# Patient Record
Sex: Female | Born: 1950 | Race: Black or African American | Hispanic: No | Smoking: Never smoker
Health system: Southern US, Community
[De-identification: ages and names within clinical notes are randomized; demographics above are authoritative.]

## PROBLEM LIST (undated history)

## (undated) DIAGNOSIS — N2581 Secondary hyperparathyroidism of renal origin: Secondary | ICD-10-CM

## (undated) DIAGNOSIS — S72002A Fracture of unspecified part of neck of left femur, initial encounter for closed fracture: Secondary | ICD-10-CM

## (undated) DIAGNOSIS — I619 Nontraumatic intracerebral hemorrhage, unspecified: Secondary | ICD-10-CM

## (undated) DIAGNOSIS — E46 Unspecified protein-calorie malnutrition: Secondary | ICD-10-CM

## (undated) DIAGNOSIS — I639 Cerebral infarction, unspecified: Secondary | ICD-10-CM

## (undated) DIAGNOSIS — I160 Hypertensive urgency: Secondary | ICD-10-CM

## (undated) DIAGNOSIS — K5792 Diverticulitis of intestine, part unspecified, without perforation or abscess without bleeding: Secondary | ICD-10-CM

## (undated) DIAGNOSIS — K2970 Gastritis, unspecified, without bleeding: Secondary | ICD-10-CM

## (undated) DIAGNOSIS — C55 Malignant neoplasm of uterus, part unspecified: Secondary | ICD-10-CM

## (undated) DIAGNOSIS — F419 Anxiety disorder, unspecified: Secondary | ICD-10-CM

## (undated) DIAGNOSIS — K222 Esophageal obstruction: Secondary | ICD-10-CM

## (undated) DIAGNOSIS — Q059 Spina bifida, unspecified: Secondary | ICD-10-CM

## (undated) DIAGNOSIS — I10 Essential (primary) hypertension: Secondary | ICD-10-CM

## (undated) DIAGNOSIS — D649 Anemia, unspecified: Secondary | ICD-10-CM

## (undated) DIAGNOSIS — K219 Gastro-esophageal reflux disease without esophagitis: Secondary | ICD-10-CM

## (undated) DIAGNOSIS — E039 Hypothyroidism, unspecified: Secondary | ICD-10-CM

## (undated) DIAGNOSIS — R42 Dizziness and giddiness: Secondary | ICD-10-CM

## (undated) DIAGNOSIS — N189 Chronic kidney disease, unspecified: Secondary | ICD-10-CM

## (undated) DIAGNOSIS — H532 Diplopia: Secondary | ICD-10-CM

## (undated) DIAGNOSIS — R1310 Dysphagia, unspecified: Secondary | ICD-10-CM

## (undated) DIAGNOSIS — N186 End stage renal disease: Secondary | ICD-10-CM

## (undated) HISTORY — DX: Hypothyroidism, unspecified: E03.9

## (undated) HISTORY — PX: ABDOMINAL HYSTERECTOMY: SHX81

## (undated) HISTORY — PX: CHOLECYSTECTOMY: SHX55

## (undated) HISTORY — PX: LAPAROSCOPIC TOTAL HYSTERECTOMY: SUR800

## (undated) HISTORY — DX: Essential (primary) hypertension: I10

## (undated) HISTORY — DX: Malignant neoplasm of uterus, part unspecified: C55

## (undated) HISTORY — PX: NASAL SEPTUM SURGERY: SHX37

## (undated) HISTORY — PX: APPENDECTOMY: SHX54

## (undated) HISTORY — PX: COLONOSCOPY: SHX174

---

## 1998-08-02 HISTORY — PX: COLONOSCOPY: SHX174

## 2001-02-28 ENCOUNTER — Other Ambulatory Visit: Admission: RE | Admit: 2001-02-28 | Discharge: 2001-02-28 | Payer: Self-pay | Admitting: Obstetrics and Gynecology

## 2001-05-03 ENCOUNTER — Encounter: Payer: Self-pay | Admitting: Family Medicine

## 2001-05-03 ENCOUNTER — Ambulatory Visit (HOSPITAL_COMMUNITY): Admission: RE | Admit: 2001-05-03 | Discharge: 2001-05-03 | Payer: Self-pay | Admitting: Family Medicine

## 2002-03-02 ENCOUNTER — Ambulatory Visit (HOSPITAL_COMMUNITY): Admission: RE | Admit: 2002-03-02 | Discharge: 2002-03-02 | Payer: Self-pay | Admitting: Internal Medicine

## 2002-08-20 ENCOUNTER — Ambulatory Visit (HOSPITAL_COMMUNITY): Admission: RE | Admit: 2002-08-20 | Discharge: 2002-08-20 | Payer: Self-pay | Admitting: Family Medicine

## 2002-08-20 ENCOUNTER — Encounter: Payer: Self-pay | Admitting: Family Medicine

## 2003-03-08 ENCOUNTER — Encounter: Payer: Self-pay | Admitting: Internal Medicine

## 2003-03-08 ENCOUNTER — Ambulatory Visit (HOSPITAL_COMMUNITY): Admission: RE | Admit: 2003-03-08 | Discharge: 2003-03-08 | Payer: Self-pay | Admitting: Internal Medicine

## 2003-03-14 ENCOUNTER — Ambulatory Visit (HOSPITAL_COMMUNITY): Admission: RE | Admit: 2003-03-14 | Discharge: 2003-03-14 | Payer: Self-pay | Admitting: Orthopedic Surgery

## 2003-03-14 ENCOUNTER — Encounter: Payer: Self-pay | Admitting: Orthopedic Surgery

## 2003-03-26 ENCOUNTER — Encounter: Payer: Self-pay | Admitting: Family Medicine

## 2003-03-26 ENCOUNTER — Ambulatory Visit (HOSPITAL_COMMUNITY): Admission: RE | Admit: 2003-03-26 | Discharge: 2003-03-26 | Payer: Self-pay | Admitting: Family Medicine

## 2003-04-01 ENCOUNTER — Ambulatory Visit (HOSPITAL_COMMUNITY): Admission: RE | Admit: 2003-04-01 | Discharge: 2003-04-01 | Payer: Self-pay | Admitting: Nephrology

## 2003-04-01 ENCOUNTER — Encounter: Payer: Self-pay | Admitting: Nephrology

## 2003-08-14 ENCOUNTER — Other Ambulatory Visit: Admission: RE | Admit: 2003-08-14 | Discharge: 2003-08-14 | Payer: Self-pay | Admitting: Obstetrics and Gynecology

## 2003-08-29 ENCOUNTER — Ambulatory Visit (HOSPITAL_COMMUNITY): Admission: RE | Admit: 2003-08-29 | Discharge: 2003-08-29 | Payer: Self-pay | Admitting: Obstetrics and Gynecology

## 2004-03-09 ENCOUNTER — Ambulatory Visit (HOSPITAL_COMMUNITY): Admission: RE | Admit: 2004-03-09 | Discharge: 2004-03-09 | Payer: Self-pay | Admitting: Family Medicine

## 2004-11-25 ENCOUNTER — Ambulatory Visit (HOSPITAL_COMMUNITY): Admission: RE | Admit: 2004-11-25 | Discharge: 2004-11-26 | Payer: Self-pay | Admitting: *Deleted

## 2004-11-27 ENCOUNTER — Ambulatory Visit (HOSPITAL_COMMUNITY): Admission: RE | Admit: 2004-11-27 | Discharge: 2004-11-27 | Payer: Self-pay | Admitting: *Deleted

## 2006-07-19 ENCOUNTER — Encounter (HOSPITAL_COMMUNITY): Admission: RE | Admit: 2006-07-19 | Discharge: 2006-08-01 | Payer: Self-pay | Admitting: Endocrinology

## 2007-04-25 ENCOUNTER — Ambulatory Visit (HOSPITAL_COMMUNITY): Admission: RE | Admit: 2007-04-25 | Discharge: 2007-04-25 | Payer: Self-pay | Admitting: Family Medicine

## 2008-12-02 ENCOUNTER — Emergency Department (HOSPITAL_COMMUNITY): Admission: EM | Admit: 2008-12-02 | Discharge: 2008-12-02 | Payer: Self-pay | Admitting: Emergency Medicine

## 2010-03-19 ENCOUNTER — Other Ambulatory Visit: Admission: RE | Admit: 2010-03-19 | Discharge: 2010-03-19 | Payer: Self-pay | Admitting: Obstetrics & Gynecology

## 2010-03-31 ENCOUNTER — Encounter (HOSPITAL_COMMUNITY): Admission: RE | Admit: 2010-03-31 | Discharge: 2010-03-31 | Payer: Self-pay | Admitting: Obstetrics & Gynecology

## 2010-03-31 ENCOUNTER — Ambulatory Visit (HOSPITAL_COMMUNITY): Payer: Self-pay | Admitting: Obstetrics & Gynecology

## 2010-04-01 ENCOUNTER — Ambulatory Visit (HOSPITAL_COMMUNITY): Admission: RE | Admit: 2010-04-01 | Discharge: 2010-04-01 | Payer: Self-pay | Admitting: Obstetrics & Gynecology

## 2010-05-06 ENCOUNTER — Ambulatory Visit: Admission: RE | Admit: 2010-05-06 | Discharge: 2010-05-06 | Payer: Self-pay | Admitting: Gynecologic Oncology

## 2010-05-12 ENCOUNTER — Encounter: Payer: Self-pay | Admitting: Obstetrics & Gynecology

## 2010-05-13 ENCOUNTER — Inpatient Hospital Stay (HOSPITAL_COMMUNITY): Admission: RE | Admit: 2010-05-13 | Discharge: 2010-05-14 | Payer: Self-pay | Admitting: Obstetrics & Gynecology

## 2010-06-17 ENCOUNTER — Ambulatory Visit
Admission: RE | Admit: 2010-06-17 | Discharge: 2010-06-17 | Payer: Self-pay | Source: Home / Self Care | Admitting: Gynecologic Oncology

## 2010-08-02 DIAGNOSIS — C55 Malignant neoplasm of uterus, part unspecified: Secondary | ICD-10-CM

## 2010-08-02 HISTORY — DX: Malignant neoplasm of uterus, part unspecified: C55

## 2010-10-15 LAB — COMPREHENSIVE METABOLIC PANEL
ALT: 13 U/L (ref 0–35)
ALT: 15 U/L (ref 0–35)
AST: 18 U/L (ref 0–37)
AST: 15 U/L (ref 0–37)
Albumin: 3.6 g/dL (ref 3.5–5.2)
Albumin: 3.3 g/dL — ABNORMAL LOW (ref 3.5–5.2)
Alkaline Phosphatase: 54 U/L (ref 39–117)
Calcium: 9.8 mg/dL (ref 8.4–10.5)
Calcium: 9.3 mg/dL (ref 8.4–10.5)
GFR calc Af Amer: 33 mL/min — ABNORMAL LOW (ref >60)
Glucose, Bld: 81 mg/dL (ref 70–99)
Potassium: 3.2 mEq/L — ABNORMAL LOW (ref 3.5–5.1)
Total Protein: 7.5 g/dL (ref 6.0–8.3)
Total Protein: 6.5 g/dL (ref 6.0–8.3)

## 2010-10-15 LAB — BASIC METABOLIC PANEL
Calcium: 8.8 mg/dL (ref 8.4–10.5)
Chloride: 110 mEq/L (ref 96–112)
Creatinine, Ser: 1.98 mg/dL — ABNORMAL HIGH (ref 0.4–1.2)
GFR calc Af Amer: 31 mL/min — ABNORMAL LOW (ref >60)
Glucose, Bld: 160 mg/dL — ABNORMAL HIGH (ref 70–99)

## 2010-10-15 LAB — CROSSMATCH

## 2010-10-15 LAB — DIFFERENTIAL
Basophils Absolute: 0 10*3/uL (ref 0.0–0.1)
Basophils Absolute: 0 10*3/uL (ref 0.0–0.1)
Basophils Relative: 0 % (ref 0–1)
Eosinophils Absolute: 0.2 10*3/uL (ref 0.0–0.7)
Eosinophils Absolute: 0.2 10*3/uL (ref 0.0–0.7)
Eosinophils Relative: 4 % (ref 0–5)
Eosinophils Relative: 4 % (ref 0–5)
Monocytes Absolute: 0.6 10*3/uL (ref 0.1–1.0)
Monocytes Relative: 10 % (ref 3–12)
Neutro Abs: 3.8 10*3/uL (ref 1.7–7.7)
Neutrophils Relative %: 58 % (ref 43–77)

## 2010-10-15 LAB — CBC
Hemoglobin: 9.6 g/dL — ABNORMAL LOW (ref 12.0–15.0)
Hemoglobin: 7.9 g/dL — ABNORMAL LOW (ref 12.0–15.0)
MCH: 24.9 pg — ABNORMAL LOW (ref 26.0–34.0)
MCHC: 32.4 g/dL (ref 30.0–36.0)
MCHC: 32.4 g/dL (ref 30.0–36.0)
MCV: 76.7 fL — ABNORMAL LOW (ref 78.0–100.0)
MCV: 77.1 fL — ABNORMAL LOW (ref 78.0–100.0)
MCV: 76.1 fL — ABNORMAL LOW (ref 78.0–100.0)
Platelets: 194 10*3/uL (ref 150–400)
Platelets: 215 10*3/uL (ref 150–400)
Platelets: 247 10*3/uL (ref 150–400)
RBC: 3.31 MIL/uL — ABNORMAL LOW (ref 3.87–5.11)
RDW: 16.8 % — ABNORMAL HIGH (ref 11.5–15.5)
WBC: 6.1 10*3/uL (ref 4.0–10.5)

## 2010-10-15 LAB — SURGICAL PCR SCREEN
Staphylococcus aureus: NEGATIVE
Staphylococcus aureus: POSITIVE — AB

## 2010-10-15 LAB — URINALYSIS, ROUTINE W REFLEX MICROSCOPIC
Bilirubin Urine: NEGATIVE
Ketones, ur: NEGATIVE mg/dL
Leukocytes, UA: NEGATIVE
Nitrite: NEGATIVE
Urobilinogen, UA: 0.2 mg/dL (ref 0.0–1.0)
pH: 6 (ref 5.0–8.0)

## 2010-10-15 LAB — ABO/RH: ABO/RH(D): B POS

## 2010-10-15 LAB — URINE MICROSCOPIC-ADD ON

## 2010-10-15 LAB — TYPE AND SCREEN: ABO/RH(D): B POS

## 2010-10-15 LAB — POCT I-STAT 4, (NA,K, GLUC, HGB,HCT): Hemoglobin: 11.2 g/dL — ABNORMAL LOW (ref 12.0–15.0)

## 2010-11-10 LAB — URINALYSIS, ROUTINE W REFLEX MICROSCOPIC
Glucose, UA: NEGATIVE mg/dL
Leukocytes, UA: NEGATIVE
Protein, ur: 100 mg/dL — AB
Urobilinogen, UA: 0.2 mg/dL (ref 0.0–1.0)
pH: 7 (ref 5.0–8.0)

## 2010-11-10 LAB — DIFFERENTIAL
Basophils Absolute: 0 10*3/uL (ref 0.0–0.1)
Basophils Relative: 0 % (ref 0–1)
Eosinophils Absolute: 0 10*3/uL (ref 0.0–0.7)
Monocytes Relative: 4 % (ref 3–12)
Neutrophils Relative %: 82 % — ABNORMAL HIGH (ref 43–77)

## 2010-11-10 LAB — BASIC METABOLIC PANEL
BUN: 26 mg/dL — ABNORMAL HIGH (ref 6–23)
CO2: 26 mEq/L (ref 19–32)
Chloride: 102 mEq/L (ref 96–112)
Creatinine, Ser: 1.46 mg/dL — ABNORMAL HIGH (ref 0.4–1.2)
Glucose, Bld: 117 mg/dL — ABNORMAL HIGH (ref 70–99)

## 2010-11-10 LAB — URINE MICROSCOPIC-ADD ON

## 2010-11-10 LAB — CBC
MCHC: 32.2 g/dL (ref 30.0–36.0)
MCV: 74.9 fL — ABNORMAL LOW (ref 78.0–100.0)
Platelets: 205 10*3/uL (ref 150–400)
RDW: 17.1 % — ABNORMAL HIGH (ref 11.5–15.5)

## 2010-12-18 NOTE — Procedures (Signed)
NAMESABIRIN, Sonya [name]                ACCOUNT NO.:  [id number]   MEDICAL RECORD NO.:  [id number]          PATIENT TYPE:  REC   LOCATION:  RAD                           FACILITY:  [facility]   PHYSICIAN:  Leslye Peer, MD       DATE OF BIRTH:  [birth year]   DATE OF PROCEDURE:  DATE OF DISCHARGE:  11/26/2004                                    STRESS TEST   INDICATIONS:  [name] has been experiencing palpations and increased  heart rate and is referred for a stress Cardiolite to assess for the  possibility of cardiac ischemia as the etiology of her complaints.   HEMODYNAMICS/ELECTROCARDIOGRAPHIC DATA:  Baseline blood pressure is 128/72  mmHg with a pulse of 81 beats per minute.   A 12-lead EKG revealed normal sinus rhythm without ischemic changes noted.  She walked for 4 minutes and 28 seconds on a full Bruce protocol, surpassing  her target heart rate of 140 beats per minute, proceeding to a peak heart  rate of 152 beats per minute.  She experienced no chest discomfort or  symptoms of claudication but did experience fatigue and shortness of breath.  She was injected per protocol.  EKG at peak heart rate revealed sinus  tachycardia with 2 mm downsloping ST depression inferiorly and 1-2 mm  horizontal to downsloping ST depression in V4-V6.  These changes persisted  through recovery and required approximately 12 minutes for normalization.  Additionally, her heart was slow to return to normal, suggestive of  significant deconditioning.  She experienced no chest discomfort during the  procedure.   IMPRESSION:  1.  Clinically negative for angina.  2.  EKG positive for ischemia.  3.  Significant deconditioning.  4.  Scintigraphic images are pending.      AB/MEDQ  D:  11/27/2004  T:  11/27/2004  Job:  [id number]

## 2010-12-18 NOTE — Procedures (Signed)
[name], CAUSEVIC                ACCOUNT NO.:  [id number]   MEDICAL RECORD NO.:  [id number]          PATIENT TYPE:  OUT   LOCATION:  RAD                           FACILITY:  [facility]   PHYSICIAN:  Bonne Dolores, M.D.    DATE OF BIRTH:  1950-08-04   DATE OF PROCEDURE:  11/26/2004  DATE OF DISCHARGE:                                  ECHOCARDIOGRAM   REFERRING PHYSICIAN:  Dr. Caron Presume, Dr. Mathis Bud   INDICATIONS:  Palpitations and shortness of breath.   The technical quality of the study is adequate.  Aorta is within normal  limits at 3.0 cm.   Left atrium is mildly dilated.  It measured at 4.3 cm.  No obvious clots or  masses were appreciated and the patient appeared to be in sinus rhythm  during this procedure.  The intraventricular septum and posterior wall  appear to be borderline thickened.   The aortic valve is trileaflet and pliable with normal leaflet excursion.  No significant aortic insufficiency is noted.  Doppler interrogation of the  aortic valve is within normal limits.   The mitral valve also appears grossly structurally normal.  Mild mitral  annular calcification is noted.  No mitral valve prolapse is noted.  Mild  mitral regurgitation is noted.  Doppler interrogation of mitral valve is  within normal limits.   The pulmonic valve is incompletely visualized, but appeared to be grossly  structurally normal.   The tricuspid valve also appears grossly structurally normal with trivial  tricuspid regurgitation noted.   The left ventricle is normal in size with the LVIDD measuring 3.9 cm and the  LVISD measured at 2.3 cm.  Overall left ventricular systolic function is  normal and no regional wall motion abnormalities are noted.  The presence of  mild diastolic dysfunction is inferred from pulse wave Doppler across the  mitral valve.   The right atrium and right ventricle are normal in size and right  ventricular systolic function is normal.   There is a trivial to  small posterior pericardial effusion without evidence  of hemodynamic compromise.   IMPRESSION:  1.  Mild left atrial enlargement.  2.  Borderline concentric left ventricular hypertrophy.  3.  Mild mitral and trivial tricuspid regurgitation.  4.  Mild mitral annular calcification.  5.  Normal left ventricular size and systolic function without regional wall      motion abnormality noted.  6.  Mild diastolic dysfunction.  7.  Trivial to small posterior pericardial effusion without evidence of      hemodynamic compromise.      AB/MEDQ  D:  11/26/2004  T:  11/26/2004  Job:  [id number]

## 2010-12-18 NOTE — H&P (Signed)
[name], [name]                          ACCOUNT NO.:  [id number]   MEDICAL RECORD NO.:  [id number]                   PATIENT TYPE:  AMB   LOCATION:  DAY                                  FACILITY:  [facility]   PHYSICIAN:  Jonnie Kind, M.D.              DATE OF BIRTH:  12-14-1950   DATE OF ADMISSION:  08/29/2003  DATE OF DISCHARGE:                                HISTORY & PHYSICAL   ADMITTING DIAGNOSES:  1. Endometrial thickening.  2. Postmenopausal bleeding.   HISTORY OF PRESENT ILLNESS:  This 60 year old female is postmenopausal 10  years, patient of Dr. Caron Presume, was seen in our office on August 14, 2003  for vaginal ultrasound and assessment for postmenopausal bleeding.  [name] is  well known to our office, having been followed here for approximately 15  years, since her late pregnancy.  She had postmenopausal bleeding while on  no hormone therapy noted.  On December 28, visit to our office, she had  light pink vaginal discharge on exam.  She was brought back for vaginal  ultrasound which on August 14, 2003 showed a 1.1 cm thickened endometrium  and a 9 cm long uterus.  Endometrial stripe measured 2.3 cm transversely x  1.1 cm in AP diameter.  Endometrial biopsy was obtained and showed  endometrial hyperplasia without atypia.  The hyperplasia was considered  complex hyperplasia with mixed, simple and complex hyperplasia, __________  biopsy.  The question of whether this is the most severe area which  necessitates this endometrial hysteroscopy D&C.   PAST MEDICAL HISTORY:  Positive for chronic hypertension, moderate obesity.   PAST SURGICAL HISTORY:  Benign.   ALLERGIES:  KEFLEX.   PHYSICAL EXAMINATION:  GENERAL:  Exam shows an obese, alert, oriented,  African-American female.  HEENT:  Pupils are equal, round, and reactive.  Extraocular movements  intact.  NECK:  Supple.  CHEST:  Clear to auscultation.  ABDOMEN:  Obese.  No masses appreciable.  PELVIC:  External  genitalia multiparous.  Cervix multiparous.  Uterus  anteflexed.  Adnexa negative for masses.  Ultrasound as noted earlier in  HPI.   IMPRESSION:  1. Postmenopausal bleeding.  2. Thickened endometrial stripe while on no HRT.   PLAN:  Hysteroscopy D&C, August 29, 2003.     ___________________________________________                                         Jonnie Kind, M.D.   JVF/MEDQ  D:  08/28/2003  T:  08/28/2003  Job:  [id number]   cc:   Jonnie Kind, M.D.  [place] [address]  Fax: [phone]   Bonne Dolores, M.D.  [address], [place]  [place] [address]  Fax: [phone]

## 2010-12-18 NOTE — Op Note (Signed)
Sonya [name], Sonya [name]                          ACCOUNT NO.:  [id number]   MEDICAL RECORD NO.:  [id number]                   PATIENT TYPE:  AMB   LOCATION:  DAY                                  FACILITY:  [facility]   PHYSICIAN:  Hildred Laser, M.D.                 DATE OF BIRTH:  Feb 20, 1951   DATE OF PROCEDURE:  03/02/2002  DATE OF DISCHARGE:  03/02/2002                                 OPERATIVE REPORT   PROCEDURE PERFORMED:  Total colonoscopy.   ENDOSCOPIST:  Hildred Laser, M.D.   INDICATIONS FOR PROCEDURE:  [name] is a 60 year old African-American female  with a history of rectal bleeding recently associated with bowel movements.  She also has noted a change in her constipation over the last four to five  months.  She has a history of chronic constipation.  Her last colonoscopy  was in October 2000 when she was felt to have a few diverticula, internal  and external hemorrhoids.  She is undergoing colonoscopy to show that she  does not have any other lesions to account for her bleeding.  The procedure  was reviewed with the patient and informed consent was obtained.   PREOP MEDICATIONS:  Demerol 50 mg IV, Versed 6 mg IV in divided dose.   INSTRUMENT USED:  Olympus video system.   FINDINGS:  Procedure performed in endoscopy suite.  The patient's vital  signs and oxygen saturations were monitored during the procedure and  remained stable.  The patient was placed in the left lateral position.  Rectal exam was performed.  No abnormality was noted on external or digital  exam.  Scope was placed in the rectum and advanced into sigmoid colon  beyond.  Preparation was satisfactory.  Some redundant colon.  Scope was  passed to the cecum which was identified by appendiceal orifice and  ileocecal valve.  Pictures taken for the record and part of her data base.  As the scope was withdrawn, the colonic mucosa was carefully examined.  There was mucosal pigmentation more pronounced in the proximal  colon  consistent with melanosis coli.  There were a few tiny diverticula at  sigmoid and descending colon.  There were no polyps, tumor masses or  stricture formation.  The rectal mucosa was normal.  The scope was  retroflexed to examine anorectal junction.  She had a few red spots right at  the dentate line along with the hemorrhoids above and below this line.  These were not very large.  The endoscope was straightened and withdrawn.  The patient tolerated the procedure well.   FINAL DIAGNOSES:  1. Examination performed to the cecum.  2. Melanosis coli.  3. Few tiny diverticula at sigmoid and descending colon.  4. External hemorrhoids felt to be the source of her recent active bleeding.    RECOMMENDATIONS:  She will continue high fiber diet, Metamucil and Colace.  CBC done today since  she has a history of anemia.  Results pending.  Will  call the patient with result.   I would like for her to follow up with Dr. Gerarda Fraction at some point to make sure  she does not have rectocele.                                               Hildred Laser, M.D.    NR/MEDQ  D:  03/02/2002  T:  03/07/2002  Job:  [id number]   cc:   Sherrilee Gilles. Gerarda Fraction, M.D.  [address] [place] [address]  Fax: [phone]

## 2010-12-18 NOTE — Op Note (Signed)
Sonya [name], Sonya [name]                          ACCOUNT NO.:  [id number]   MEDICAL RECORD NO.:  [id number]                   PATIENT TYPE:  AMB   LOCATION:  DAY                                  F[facility]ILITY:  [facility]   PHYSICIAN:  Jonnie Kind, M.D.              DATE OF BIRTH:  [birth year]   DATE OF PROCEDURE:  08/29/2003  DATE OF DISCHARGE:                                 OPERATIVE REPORT   PREOPERATIVE DIAGNOSES:  1. Endometrial thickening.  2. Endometrial hyperplasia, complex.   POSTOPERATIVE DIAGNOSES:  1. Endometrial thickening.  2. Endometrial hyperplasia, complex.   PROCEDURE:  1. Hysteroscopy.  2. Dilatation and curettage.   SURGEON:  Jonnie Kind, M.D.   ASSISTANT:  None.   ANESTHESIA:  General with endotracheal intubation--Idacavage, CRNA   COMPLICATIONS:  None.   FINDINGS:  Uterus sounding 8-10 cm with some shaggy tissue in the lateral  aspects of a smooth endometrial contour.   INDICATIONS:  A 60 year old female, 10 years postmenopausal, who has begun  to experience some light vaginal bleeding.  She is on no hormone therapy.  Prior biopsy had suggested endometrial hyperplasia without atypia.  Hysteroscopy D&C is to obtain maximal tissue sample to decide on therapy.   DETAILS OF PROCEDURE:  The patient was taken to the operating room and was  prepped and draped for the usual procedure with the cervix grasped with a  single-tooth tenaculum, uterus sounded to 10 cm, dilated to 25 [place] which  allowed introduction of a 5-mm, 30 degree hysteroscope which visualized the  uterine contours with photo documentation performed.  The smooth, sharp,  curettage in all quadrants removed the shaggy tissue which  was notable in each lateral internal uterine contour.  There was no frank  polyps and no lesions that appeared cancerous.  Sharp curettage obtained a  reasonable specimen of blood and tissue and then the patient was allowed to  awaken and go to recovery room in good  condition.      ___________________________________________                                            Jonnie Kind, M.D.   JVF/MEDQ  D:  08/29/2003  T:  08/29/2003  Job:  [id number]   cc:   Jonnie Kind, M.D.  [place] [address]  Fax: [phone]   Bonne Dolores, M.D.  [address], [place]  [place] [address]  Fax: [phone]

## 2010-12-30 ENCOUNTER — Ambulatory Visit: Payer: Self-pay | Admitting: Gynecologic Oncology

## 2011-04-08 ENCOUNTER — Encounter: Payer: Self-pay | Admitting: Gastroenterology

## 2011-04-08 ENCOUNTER — Ambulatory Visit (INDEPENDENT_AMBULATORY_CARE_PROVIDER_SITE_OTHER): Payer: PRIVATE HEALTH INSURANCE | Admitting: Gastroenterology

## 2011-04-08 VITALS — BP 195/100 | HR 97 | Temp 97.4°F | Ht 67.0 in | Wt 197.0 lb

## 2011-04-08 DIAGNOSIS — R1915 Other abnormal bowel sounds: Secondary | ICD-10-CM

## 2011-04-08 DIAGNOSIS — R198 Other specified symptoms and signs involving the digestive system and abdomen: Secondary | ICD-10-CM

## 2011-04-08 DIAGNOSIS — Z1211 Encounter for screening for malignant neoplasm of colon: Secondary | ICD-10-CM

## 2011-04-08 DIAGNOSIS — I10 Essential (primary) hypertension: Secondary | ICD-10-CM

## 2011-04-08 NOTE — Assessment & Plan Note (Signed)
See Dr. Gerarda Fraction in the next 1-2 weeks to manage BP.

## 2011-04-08 NOTE — Patient Instructions (Signed)
The noise in your stomach is normal. It is called borborygmi.  Borborygmus (pronounced /b??.b?????.m?s/; plural borborygmi) is a rumbling or gurgling noise that occurs from the movements of fluid and gas in the intestines. This process occurs in some animals, including humans. The sound occurs due to gases in the body that flow through the small intestine. As waves of muscle contractions move foods and gases through the digestive system, the food is pushed against the intestinal wall, which induces the noise. The process of these contractions is also known as peristalsis, which is the ultimate cause of borborygmus.  Please give me a call if you develop abdominal pain, weight loss, change in your bowel habits, nausea, vomiting, or loss of appetite. FOLLOW UP AS NEEDED. SCREENING COLONOSCOPY IN 2013. Follow a high fiber diet TO REDUCE YOUR RISK OF COLON CANCER.  AVOID FIBER ITEMS THAT CAUSE BLOATING AND GAS.  High-Fiber Diet A high-fiber diet changes your normal diet to include more whole grains, legumes, fruits, and vegetables. Changes in the diet involve replacing refined carbohydrates with unrefined foods. The calorie level of the diet is essentially unchanged. The Dietary Reference Intake (recommended amount) for adult males is 38 grams per day. For adult females, it is 25 grams per day. Pregnant and lactating women should consume 28 grams of fiber per day. Fiber is the intact part of a plant that is not broken down during digestion. Functional fiber is fiber that has been isolated from the plant to provide a beneficial effect in the body. PURPOSE  Increase stool bulk.   Ease and regulate bowel movements.   Lower cholesterol.  INDICATIONS THAT YOU NEED MORE FIBER  Constipation and hemorrhoids.   Uncomplicated diverticulosis (intestine condition) and irritable bowel syndrome.   Weight management.   As a protective measure against hardening of the arteries (atherosclerosis), diabetes, and  cancer.   GUIDELINES FOR INCREASING FIBER IN THE DIET  Start adding fiber to the diet slowly. A gradual increase of about 5 more grams (2 slices of whole-wheat bread, 2 servings of most fruits or vegetables, or 1 bowl of high-fiber cereal) per day is best. Too rapid an increase in fiber may result in constipation, flatulence, and bloating.   Drink enough water and fluids to keep your urine clear or pale yellow. Water, juice, or caffeine-free drinks are recommended. Not drinking enough fluid may cause constipation.   Eat a variety of high-fiber foods rather than one type of fiber.   Try to increase your intake of fiber through using high-fiber foods rather than fiber pills or supplements that contain small amounts of fiber.   The goal is to change the types of food eaten. Do not supplement your present diet with high-fiber foods, but replace foods in your present diet.  INCLUDE A VARIETY OF FIBER SOURCES  Replace refined and processed grains with whole grains, canned fruits with fresh fruits, and incorporate other fiber sources. White rice, white breads, and most bakery goods contain little or no fiber.   Brown whole-grain rice, buckwheat oats, and many fruits and vegetables are all good sources of fiber. These include: broccoli, Brussels sprouts, cabbage, cauliflower, beets, sweet potatoes, white potatoes (skin on), carrots, tomatoes, eggplant, squash, berries, fresh fruits, and dried fruits.   Cereals appear to be the richest source of fiber. Cereal fiber is found in whole grains and bran. Bran is the fiber-rich outer coat of cereal grain, which is largely removed in refining. In whole-grain cereals, the bran remains. In breakfast cereals, the  largest amount of fiber is found in those with "bran" in their names. The fiber content is sometimes indicated on the label.   You may need to include additional fruits and vegetables each day.   In baking, for 1 cup white flour, you may use the  following substitutions:   1 cup whole-wheat flour minus 2 tablespoons.   1/2 cup white flour plus 1/2 cup whole-wheat flour.

## 2011-04-08 NOTE — Progress Notes (Signed)
Subjective:    Patient ID: Sonya [name], female    DOB: 02-10-1951, 60 y.o.   MRN: [id number]  PCP: [facility]  HPI Growling in left side of abdomen. Worse: occurs more frequently. Sx last for 5-10 mins, if she turns on a different aside it goes away. No problems sleeping. Usually when she lays down at night. Moves and it gets better. As long as she lays on left side it keeps going. No nausea, vomiting. When she drinks things with ice she gets chilled all over. No problems swallowing, hematemeis, heartburn/indigestion, anorexia, weight loss, diarrhea, BRBPR, abd pain, or constipation. LAST VISIT WITH DR. Gerarda Fraction > 1 YEAR. PT REALLY SCARED. NO CHEST PAIN, SOB, OR BLURRY VISION. Usually checks her BP at work and it stays around 150/70-80. TODAY HER DBP IS 100.  Past Medical History  Diagnosis Date  . HTN (hypertension)   . Hypothyroidism   . Uterine cancer 2012    Past Surgical History  Procedure Date  . Cholecystectomy   . Cesarean section   . Laparoscopic total hysterectomy   . Colonoscopy 2000    [facility], [facility]  . Colonoscopy 2003 NUR BRBPR D50 V6    DC/[place] [facility]    Allergies  Allergen Reactions  . Keflex     Current Outpatient Prescriptions  Medication Sig Dispense Refill  . aspirin 81 MG tablet Take 81 mg by mouth daily.        . bisoprolol-hydrochlorothiazide (ZIAC) 2.5-6.25 MG per tablet Take 1 tablet by mouth daily.        . ferrous sulfate dried (SLOW FE) 160 (50 FE) MG TBCR Take 160 mg by mouth daily.        Marland Kitchen levothyroxine (SYNTHROID, LEVOTHROID) 50 MCG tablet Take 50 mcg by mouth daily.        Marland Kitchen MECLIZINE HCL PO Take 10 mg by mouth as needed.        . triamterene-hydrochlorothiazide (DYAZIDE) 37.5-25 MG per capsule Take 1 capsule by mouth every morning.          Family History  Problem Relation Age of Onset  . Colon cancer Neg Hx     History   Social History  . Marital Status: Married             Social History Main Topics  . Smoking status: Never Smoker   .     Marland Kitchen Alcohol Use: No  . Drug Use: No  .     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    Review of Systems  All other systems reviewed and are negative.       Objective:   Physical Exam  Constitutional: She is oriented to person, place, and time. She appears well-developed and well-nourished. No distress.  HENT:  Head: Normocephalic and atraumatic.  Mouth/Throat: Oropharynx is clear and moist. No oropharyngeal exudate.  Eyes: Pupils are equal, round, and reactive to light. No scleral icterus.  Neck: Normal range of motion. Neck supple.  Cardiovascular: Normal rate, regular rhythm and normal heart sounds.   Pulmonary/Chest: Effort normal and breath sounds normal.  Abdominal: Bowel sounds are normal. She exhibits no distension and no mass. There is no tenderness.       NO PULSATILE MASSES, NO BRUITS  Musculoskeletal: Normal range of motion. She exhibits no edema.  Lymphadenopathy:    She has no cervical adenopathy.  Neurological: She is alert and oriented to person, place, and time.  NO FOCAL DEFICITS          Assessment & Plan:

## 2011-04-08 NOTE — Progress Notes (Addendum)
Cc to PCP 

## 2011-04-08 NOTE — Assessment & Plan Note (Addendum)
SCREENING COLONOSCOPY IN 2013. Follow a high fiber diet TO REDUCE YOUR RISK OF COLON CANCER.  AVOID FIBER ITEMS THAT CAUSE BLOATING AND GAS. HOLD IRON FOR 7 DAYS.

## 2011-04-08 NOTE — Progress Notes (Signed)
Reminder in epic to have tcs in 2013

## 2011-04-08 NOTE — Assessment & Plan Note (Signed)
Pt should call if she develops abdominal pain, weight loss, change in your bowel habits, nausea, vomiting, or loss of appetite. FOLLOW UP AS NEEDED.

## 2011-10-26 ENCOUNTER — Other Ambulatory Visit (HOSPITAL_COMMUNITY): Payer: Self-pay | Admitting: Internal Medicine

## 2011-10-26 DIAGNOSIS — Z139 Encounter for screening, unspecified: Secondary | ICD-10-CM

## 2011-11-01 ENCOUNTER — Ambulatory Visit (HOSPITAL_COMMUNITY)
Admission: RE | Admit: 2011-11-01 | Discharge: 2011-11-01 | Disposition: A | Discharge status: Home / Self Care | Payer: PRIVATE HEALTH INSURANCE | Source: Ambulatory Visit | Attending: Internal Medicine | Admitting: Internal Medicine

## 2011-11-01 ENCOUNTER — Other Ambulatory Visit (HOSPITAL_COMMUNITY): Payer: Self-pay | Admitting: Internal Medicine

## 2011-11-01 DIAGNOSIS — Z139 Encounter for screening, unspecified: Secondary | ICD-10-CM

## 2011-11-01 DIAGNOSIS — N6320 Unspecified lump in the left breast, unspecified quadrant: Secondary | ICD-10-CM

## 2011-11-03 ENCOUNTER — Other Ambulatory Visit (HOSPITAL_COMMUNITY): Payer: Self-pay | Admitting: Internal Medicine

## 2011-11-03 ENCOUNTER — Ambulatory Visit (HOSPITAL_COMMUNITY)
Admission: RE | Admit: 2011-11-03 | Discharge: 2011-11-03 | Disposition: A | Discharge status: Home / Self Care | Payer: PRIVATE HEALTH INSURANCE | Source: Ambulatory Visit | Attending: Internal Medicine | Admitting: Internal Medicine

## 2011-11-03 DIAGNOSIS — N6320 Unspecified lump in the left breast, unspecified quadrant: Secondary | ICD-10-CM

## 2011-11-03 DIAGNOSIS — N63 Unspecified lump in unspecified breast: Secondary | ICD-10-CM | POA: Insufficient documentation

## 2012-01-05 ENCOUNTER — Other Ambulatory Visit (HOSPITAL_COMMUNITY): Payer: Self-pay | Admitting: Internal Medicine

## 2012-01-05 ENCOUNTER — Ambulatory Visit (HOSPITAL_COMMUNITY)
Admission: RE | Admit: 2012-01-05 | Discharge: 2012-01-05 | Disposition: A | Discharge status: Home / Self Care | Payer: No Typology Code available for payment source | Source: Ambulatory Visit | Attending: Internal Medicine | Admitting: Internal Medicine

## 2012-01-05 DIAGNOSIS — M79609 Pain in unspecified limb: Secondary | ICD-10-CM | POA: Insufficient documentation

## 2012-01-05 DIAGNOSIS — S93609A Unspecified sprain of unspecified foot, initial encounter: Secondary | ICD-10-CM

## 2012-01-05 DIAGNOSIS — M949 Disorder of cartilage, unspecified: Secondary | ICD-10-CM | POA: Insufficient documentation

## 2012-01-05 DIAGNOSIS — M899 Disorder of bone, unspecified: Secondary | ICD-10-CM | POA: Insufficient documentation

## 2012-04-10 ENCOUNTER — Encounter: Payer: Self-pay | Admitting: Gastroenterology

## 2013-05-21 ENCOUNTER — Telehealth: Payer: Self-pay | Admitting: Gastroenterology

## 2013-05-21 NOTE — Telephone Encounter (Signed)
REVIEWED. AGREE. 

## 2013-05-21 NOTE — Telephone Encounter (Signed)
FYI to Dr. Fields.  

## 2013-05-21 NOTE — Telephone Encounter (Signed)
I called pt. She has been having the abdominal pain and nausea for awhile. She said it is worse when she eats certain foods. She has not been seen for 2 years. She was mailed letter to schedule colonoscopy in 2013. She is scheduled OV with Dr. Oneida Alar on 06/07/2013 and is aware she needs appt since she has not been seen here in so long. I told her if her abdominal pain gets severe to go to the ED and she said OK.

## 2013-05-21 NOTE — Telephone Encounter (Signed)
Pt calls this afternoon wanting to make OV to set up tcs with [facility]. Patient said that she is having other problems with her stomach/abd pain and maybe needs to have an EGD as well. I told her since we have not seen her in 2 years and is having problems that we need to bring her in the office first. I offered her SF first available on 11/6 and she is upset that she can't be seen sooner because this has been going on for 3 weeks and she told me that when she saw SF last that SF told her that she didn't need OV. I told her it had been since Sept 2012 since she seen [facility] and if she needed a procedure done she would need an updated History and Physical. Pt said it hadn't been that long, but I told her that's what the computer has as her first (new patient) and last visit with [place]. She asked if DS would call her back to see if she could be triaged since she doesn't have insurance and didn't want to run up a large bill. Then she asked if she had to come in could she be seen urgent. Please advise and call [phone]

## 2013-06-07 ENCOUNTER — Encounter: Payer: Self-pay | Admitting: Gastroenterology

## 2013-06-07 ENCOUNTER — Encounter (INDEPENDENT_AMBULATORY_CARE_PROVIDER_SITE_OTHER): Payer: Self-pay

## 2013-06-07 ENCOUNTER — Ambulatory Visit (HOSPITAL_COMMUNITY)
Admission: RE | Admit: 2013-06-07 | Discharge: 2013-06-07 | Disposition: A | Discharge status: Home / Self Care | Payer: Medicaid Other | Source: Ambulatory Visit | Attending: Gastroenterology | Admitting: Gastroenterology

## 2013-06-07 ENCOUNTER — Other Ambulatory Visit: Payer: Self-pay | Admitting: Gastroenterology

## 2013-06-07 ENCOUNTER — Ambulatory Visit (INDEPENDENT_AMBULATORY_CARE_PROVIDER_SITE_OTHER): Payer: Medicaid Other | Admitting: Gastroenterology

## 2013-06-07 ENCOUNTER — Other Ambulatory Visit: Payer: Self-pay

## 2013-06-07 VITALS — BP 224/108 | HR 116 | Temp 97.5°F | Wt 181.4 lb

## 2013-06-07 DIAGNOSIS — R198 Other specified symptoms and signs involving the digestive system and abdomen: Secondary | ICD-10-CM

## 2013-06-07 DIAGNOSIS — R634 Abnormal weight loss: Secondary | ICD-10-CM

## 2013-06-07 DIAGNOSIS — R1319 Other dysphagia: Secondary | ICD-10-CM

## 2013-06-07 DIAGNOSIS — I10 Essential (primary) hypertension: Secondary | ICD-10-CM

## 2013-06-07 DIAGNOSIS — R1310 Dysphagia, unspecified: Secondary | ICD-10-CM

## 2013-06-07 DIAGNOSIS — I160 Hypertensive urgency: Secondary | ICD-10-CM

## 2013-06-07 MED ORDER — CLONIDINE HCL 0.1 MG PO TABS: ORAL_TABLET | ORAL | Status: DC

## 2013-06-07 NOTE — Addendum Note (Signed)
Addended by: Idamae Schuller on: 06/07/2013 12:52 PM   Modules accepted: Orders

## 2013-06-07 NOTE — Addendum Note (Signed)
Addended by: Danie Binder on: 06/07/2013 10:25 AM   Modules accepted: Orders

## 2013-06-07 NOTE — Addendum Note (Signed)
Addended by: Idamae Schuller on: 06/07/2013 12:44 PM   Modules accepted: Orders

## 2013-06-07 NOTE — Progress Notes (Signed)
Subjective:    Patient ID: Sonya [name], female    DOB: June 12, 1951, 62 y.o.   MRN: [id number]  [name], MD  HPI Unable to keep take pills for 3 weeks. TAKES THEM BUT COMES RIGHT BACK. CAN ONLY TOLERATE TOAST AND JELLO. UNINTENTIONAL WEIGHT LOSS: 2012 197 LBS. NO ETOH, SMOKE, DIP, OR CHEW. DIDN'T GO TO ED BECAUSE SHE OWES CONE >$400 PER MONTH. FEELS COLD FOR PAST 3 WEEKS. FEELS SICK AT HER STOMACH. PILLS GO DOWN BUT COME RIGHT BACK UP. THEN GAGGING AND THROWING UP. WHEN SHE BELCHES ALL SHE CAN TASTE IS ACID. BMs: NO BM. DOES WHEN SHE GETS AND MOVES AROUND. LAST ENDO FELT LIKE THE WALLS WERE COMING IN ON HER. PT DENIES FEVER, CHILLS, BRBPR, melena, diarrhea, constipation, OR abd pain. C/O heartburn or indigestion. NO ASPIRIN, BC/GOODY POWDERS, IBUPROFEN/MOTRIN, OR NAPROXEN/ALEVE. SML GOITER. NO THYROID CA.   Past Medical History  Diagnosis Date  . HTN (hypertension)   . Hypothyroidism   . Uterine cancer 2012   Past Surgical History  Procedure Laterality Date  . Cholecystectomy    . Cesarean section    . Laparoscopic total hysterectomy    . Colonoscopy  2000    [facility], [facility]  . Colonoscopy  2003 NUR BRBPR D50 V6    [facility]/[place] [facility], [facility]    Allergies  Allergen Reactions  . Cephalexin    Pt not taking any meds DUE TO DYSPHAGIA. Current Outpatient Prescriptions  Medication Sig Dispense Refill  . allopurinol (ZYLOPRIM) 100 MG tablet Take 100 mg by mouth daily.      Marland Kitchen aspirin 81 MG tablet Take 81 mg by mouth daily.        Marland Kitchen docusate sodium (COLACE) 100 MG capsule Take 100 mg by mouth 2 (two) times daily.      . ferrous sulfate dried (SLOW FE) 160 (50 FE) MG TBCR Take 160 mg by mouth daily.        . furosemide (LASIX) 20 MG tablet Take 20 mg by mouth.      Marland Kitchen ibuprofen (ADVIL,MOTRIN) 200 MG tablet Take 200 mg by mouth every 6 (six) hours as needed.      . indomethacin (INDOCIN) 50 MG capsule Take 50 mg by mouth 2 (two) times daily with a meal.      . levothyroxine (SYNTHROID,  LEVOTHROID) 50 MCG tablet Take 50 mcg by mouth daily.        Marland Kitchen MECLIZINE HCL PO Take 20 mg by mouth as needed.       . potassium chloride SA (K-DUR,KLOR-CON) 20 MEQ tablet Take 20 mEq by mouth 2 (two) times daily.      . verapamil (CALAN) 120 MG tablet Take 120 mg by mouth once.      .        .         Family History  Problem Relation Age of Onset  . 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   Review of Systems PER HPI OTHERWISE ALL SYSTEMS ARE NEGATIVE.    Objective:   Physical Exam  Vitals reviewed. Constitutional: She is oriented to person, place, and time. She appears well-nourished. No distress.  HENT:  Head: Normocephalic and atraumatic.  Mouth/Throat: Oropharynx is clear and moist. No oropharyngeal exudate.  Eyes: Pupils are equal, round, and reactive to light. No scleral icterus.  Neck: Normal range of motion. Neck supple.  Cardiovascular: Normal rate, regular rhythm and normal heart sounds.   Pulmonary/Chest: Effort normal and breath sounds normal. No respiratory distress.  Abdominal: Soft. Bowel sounds are normal. She exhibits no distension. There is no tenderness.  Musculoskeletal: Normal range of motion. She exhibits no edema.  Lymphadenopathy:    She has no cervical adenopathy.  Neurological: She is alert and oriented to person, place, and time.  NO FOCAL DEFICITS   Psychiatric: She has a normal mood and affect.          Assessment & Plan:

## 2013-06-07 NOTE — Assessment & Plan Note (Signed)
ETIOLOGY UNCLEAR-DIFFERENTIAL DIAGNOSIS INCLUDES STRICTURE, ESO CA, OR PRIMARY ESO MOTILITY DISORDER.  EGD/?DIL NOV 6 WITH FENTANYL/VERSED CXR/PA AND LAT TODAY CBC/CMP TODAY OPV IN 4 MOS

## 2013-06-07 NOTE — Assessment & Plan Note (Signed)
DBP > 105. DISCUSSED WITH PT SHE SHOULD GO TO ED FOR IV BP MEDS. EXPLAINED BENEFITS V. RISK S OF ED VSIT/OV MEDS. PT VOICED UNDERSTANDING AND DECLINED TO GO TO ED.  WILL RX CLONIDINE 0. 1 MG NOW THEN REPEAT IN 1 HR. PT WILL CALL WITH BP. & ADDITIONAL INSTRUCTIONS FOR THE MANAGEMENT OF HER BP.

## 2013-06-07 NOTE — Patient Instructions (Signed)
1. GO TO [facility]-TAKE ONE CLONIDINE 0.1 MG NOW THEN REPEAT IN 1 HR. CALL ME WITH YOUR BP & ADDITIONAL INSTRUCTIONS.   2. GO TO GET YOUR BLOOD DRAWN.  3. GO  TO GET YOUR CHEST XRAY TODAY.  UPPER ENDOSCOPY/POSSIBLE DILATION TOMORROW AT 1230 P.  FOLLOW UP IN 4 MOS.

## 2013-06-08 ENCOUNTER — Encounter (HOSPITAL_COMMUNITY): Payer: Self-pay | Admitting: *Deleted

## 2013-06-08 ENCOUNTER — Telehealth: Payer: Self-pay

## 2013-06-08 ENCOUNTER — Inpatient Hospital Stay (HOSPITAL_COMMUNITY)
Admission: RE | Admit: 2013-06-08 | Discharge: 2013-06-12 | DRG: 683 | Disposition: A | Discharge status: Home / Self Care | Payer: Medicaid Other | Source: Ambulatory Visit | Attending: Internal Medicine | Admitting: Internal Medicine

## 2013-06-08 ENCOUNTER — Encounter (HOSPITAL_COMMUNITY)
Admission: RE | Disposition: A | Discharge status: Home / Self Care | Payer: Self-pay | Source: Ambulatory Visit | Attending: Internal Medicine

## 2013-06-08 DIAGNOSIS — E039 Hypothyroidism, unspecified: Secondary | ICD-10-CM | POA: Diagnosis present

## 2013-06-08 DIAGNOSIS — M109 Gout, unspecified: Secondary | ICD-10-CM | POA: Diagnosis present

## 2013-06-08 DIAGNOSIS — T3995XA Adverse effect of unspecified nonopioid analgesic, antipyretic and antirheumatic, initial encounter: Secondary | ICD-10-CM | POA: Diagnosis present

## 2013-06-08 DIAGNOSIS — K2970 Gastritis, unspecified, without bleeding: Secondary | ICD-10-CM | POA: Diagnosis present

## 2013-06-08 DIAGNOSIS — E876 Hypokalemia: Secondary | ICD-10-CM | POA: Diagnosis present

## 2013-06-08 DIAGNOSIS — D649 Anemia, unspecified: Secondary | ICD-10-CM

## 2013-06-08 DIAGNOSIS — Z8542 Personal history of malignant neoplasm of other parts of uterus: Secondary | ICD-10-CM

## 2013-06-08 DIAGNOSIS — K2990 Gastroduodenitis, unspecified, without bleeding: Secondary | ICD-10-CM

## 2013-06-08 DIAGNOSIS — K222 Esophageal obstruction: Secondary | ICD-10-CM | POA: Diagnosis present

## 2013-06-08 DIAGNOSIS — N189 Chronic kidney disease, unspecified: Secondary | ICD-10-CM

## 2013-06-08 DIAGNOSIS — R1319 Other dysphagia: Secondary | ICD-10-CM

## 2013-06-08 DIAGNOSIS — N185 Chronic kidney disease, stage 5: Secondary | ICD-10-CM | POA: Diagnosis present

## 2013-06-08 DIAGNOSIS — R1310 Dysphagia, unspecified: Secondary | ICD-10-CM

## 2013-06-08 DIAGNOSIS — I10 Essential (primary) hypertension: Secondary | ICD-10-CM

## 2013-06-08 DIAGNOSIS — I129 Hypertensive chronic kidney disease with stage 1 through stage 4 chronic kidney disease, or unspecified chronic kidney disease: Secondary | ICD-10-CM | POA: Diagnosis present

## 2013-06-08 DIAGNOSIS — R1013 Epigastric pain: Secondary | ICD-10-CM

## 2013-06-08 DIAGNOSIS — K3189 Other diseases of stomach and duodenum: Secondary | ICD-10-CM

## 2013-06-08 DIAGNOSIS — D631 Anemia in chronic kidney disease: Secondary | ICD-10-CM | POA: Diagnosis present

## 2013-06-08 DIAGNOSIS — R634 Abnormal weight loss: Secondary | ICD-10-CM

## 2013-06-08 DIAGNOSIS — N183 Chronic kidney disease, stage 3 unspecified: Secondary | ICD-10-CM | POA: Diagnosis present

## 2013-06-08 DIAGNOSIS — N179 Acute kidney failure, unspecified: Principal | ICD-10-CM | POA: Diagnosis present

## 2013-06-08 DIAGNOSIS — Z23 Encounter for immunization: Secondary | ICD-10-CM

## 2013-06-08 DIAGNOSIS — E872 Acidosis, unspecified: Secondary | ICD-10-CM | POA: Diagnosis present

## 2013-06-08 HISTORY — PX: ESOPHAGOGASTRODUODENOSCOPY (EGD) WITH ESOPHAGEAL DILATION: SHX5812

## 2013-06-08 LAB — COMPREHENSIVE METABOLIC PANEL
ALT: <8 U/L (ref 0–35)
AST: 13 U/L (ref 0–37)
Alkaline Phosphatase: 53 U/L (ref 39–117)
BUN: 33 mg/dL — ABNORMAL HIGH (ref 6–23)
CO2: 21 mEq/L (ref 19–32)
Calcium: 9.4 mg/dL (ref 8.4–10.5)
Creat: 4.4 mg/dL — ABNORMAL HIGH (ref 0.50–1.10)
Total Bilirubin: 0.3 mg/dL (ref 0.3–1.2)

## 2013-06-08 LAB — BASIC METABOLIC PANEL
BUN: 39 mg/dL — ABNORMAL HIGH (ref 6–23)
CO2: 24 mEq/L (ref 19–32)
Calcium: 9.3 mg/dL (ref 8.4–10.5)
Chloride: 105 mEq/L (ref 96–112)
Creatinine, Ser: 5.31 mg/dL — ABNORMAL HIGH (ref 0.50–1.10)
GFR calc Af Amer: 9 mL/min — ABNORMAL LOW (ref >90)
GFR calc non Af Amer: 8 mL/min — ABNORMAL LOW (ref >90)
Potassium: 3.2 mEq/L — ABNORMAL LOW (ref 3.5–5.1)

## 2013-06-08 LAB — CBC WITH DIFFERENTIAL/PLATELET
Basophils Absolute: 0 10*3/uL (ref 0.0–0.1)
Basophils Relative: 1 % (ref 0–1)
Eosinophils Absolute: 0.2 10*3/uL (ref 0.0–0.7)
Eosinophils Relative: 3 % (ref 0–5)
HCT: 21.9 % — ABNORMAL LOW (ref 36.0–46.0)
Lymphocytes Relative: 30 % (ref 12–46)
MCH: 22.6 pg — ABNORMAL LOW (ref 26.0–34.0)
MCHC: 30.6 g/dL (ref 30.0–36.0)
MCV: 71.8 fL — ABNORMAL LOW (ref 78.0–100.0)
Neutrophils Relative %: 58 % (ref 43–77)
Platelets: 279 10*3/uL (ref 150–400)
RDW: 22.1 % — ABNORMAL HIGH (ref 11.5–15.5)
WBC: 6.4 10*3/uL (ref 4.0–10.5)

## 2013-06-08 LAB — URINALYSIS, ROUTINE W REFLEX MICROSCOPIC
Bilirubin Urine: NEGATIVE
Ketones, ur: NEGATIVE mg/dL
Leukocytes, UA: NEGATIVE
Nitrite: NEGATIVE
Protein, ur: 100 mg/dL — AB
pH: 6 (ref 5.0–8.0)

## 2013-06-08 LAB — PREPARE RBC (CROSSMATCH)

## 2013-06-08 LAB — CBC
HCT: 19.6 % — ABNORMAL LOW (ref 36.0–46.0)
MCH: 22.9 pg — ABNORMAL LOW (ref 26.0–34.0)
MCV: 73.7 fL — ABNORMAL LOW (ref 78.0–100.0)
RBC: 2.66 MIL/uL — ABNORMAL LOW (ref 3.87–5.11)
RDW: 19.7 % — ABNORMAL HIGH (ref 11.5–15.5)
WBC: 5.4 10*3/uL (ref 4.0–10.5)

## 2013-06-08 LAB — RETICULOCYTES
RBC.: 2.66 MIL/uL — ABNORMAL LOW (ref 3.87–5.11)
Retic Count, Absolute: 53.2 10*3/uL (ref 19.0–186.0)
Retic Ct Pct: 2 % (ref 0.4–3.1)

## 2013-06-08 SURGERY — ESOPHAGOGASTRODUODENOSCOPY (EGD) WITH ESOPHAGEAL DILATION
Anesthesia: Moderate Sedation

## 2013-06-08 MED ORDER — ONDANSETRON HCL 4 MG/2ML IJ SOLN: 4.0000 mg | Freq: Four times a day (QID) | INTRAMUSCULAR | Status: DC | PRN

## 2013-06-08 MED ORDER — HYDRALAZINE HCL 20 MG/ML IJ SOLN
INTRAMUSCULAR | Status: AC
  Filled 2013-06-08: qty 1

## 2013-06-08 MED ORDER — POTASSIUM CHLORIDE 20 MEQ/15ML (10%) PO LIQD
40.0000 meq | Freq: Once | ORAL | Status: AC
  Administered 2013-06-08: 40 meq via ORAL
  Filled 2013-06-08: qty 30

## 2013-06-08 MED ORDER — ONDANSETRON HCL 4 MG PO TABS
4.0000 mg | ORAL_TABLET | Freq: Four times a day (QID) | ORAL | Status: DC | PRN
  Administered 2013-06-10 (×2): 4 mg via ORAL
  Filled 2013-06-08 (×2): qty 1

## 2013-06-08 MED ORDER — MEPERIDINE HCL 100 MG/ML IJ SOLN
INTRAMUSCULAR | Status: DC | PRN
  Administered 2013-06-08 (×2): 25 mg via INTRAVENOUS

## 2013-06-08 MED ORDER — PHENOL 1.4 % MT LIQD
1.0000 | OROMUCOSAL | Status: DC | PRN
  Administered 2013-06-08: 1 via OROMUCOSAL
  Filled 2013-06-08: qty 177

## 2013-06-08 MED ORDER — MIDAZOLAM HCL 5 MG/5ML IJ SOLN
INTRAMUSCULAR | Status: AC
  Filled 2013-06-08: qty 5

## 2013-06-08 MED ORDER — MIDAZOLAM HCL 5 MG/5ML IJ SOLN
INTRAMUSCULAR | Status: DC | PRN
  Administered 2013-06-08: 1 mg via INTRAVENOUS

## 2013-06-08 MED ORDER — OMEPRAZOLE 20 MG PO CPDR: DELAYED_RELEASE_CAPSULE | ORAL | Status: DC

## 2013-06-08 MED ORDER — MEPERIDINE HCL 100 MG/ML IJ SOLN
INTRAMUSCULAR | Status: AC
  Filled 2013-06-08: qty 2

## 2013-06-08 MED ORDER — HYDRALAZINE HCL 20 MG/ML IJ SOLN
10.0000 mg | Freq: Once | INTRAMUSCULAR | Status: AC
  Administered 2013-06-08: 10 mg via INTRAVENOUS

## 2013-06-08 MED ORDER — ACETAMINOPHEN 325 MG PO TABS: 650.0000 mg | ORAL_TABLET | Freq: Four times a day (QID) | ORAL | Status: DC | PRN

## 2013-06-08 MED ORDER — MEPERIDINE HCL 25 MG/ML IJ SOLN
INTRAMUSCULAR | Status: DC | PRN
  Administered 2013-06-08: 25 mg via INTRAVENOUS

## 2013-06-08 MED ORDER — HYDRALAZINE HCL 25 MG PO TABS
50.0000 mg | ORAL_TABLET | Freq: Three times a day (TID) | ORAL | Status: DC
  Administered 2013-06-08 – 2013-06-12 (×12): 50 mg via ORAL
  Filled 2013-06-08 (×13): qty 2

## 2013-06-08 MED ORDER — SODIUM CHLORIDE 0.9 % IV SOLN
INTRAVENOUS | Status: DC
  Administered 2013-06-08: 12:00:00 via INTRAVENOUS

## 2013-06-08 MED ORDER — INFLUENZA VAC SPLIT QUAD 0.5 ML IM SUSP
0.5000 mL | Freq: Once | INTRAMUSCULAR | Status: AC
  Administered 2013-06-09: 0.5 mL via INTRAMUSCULAR
  Filled 2013-06-08: qty 0.5

## 2013-06-08 MED ORDER — DOCUSATE SODIUM 100 MG PO CAPS
100.0000 mg | ORAL_CAPSULE | Freq: Two times a day (BID) | ORAL | Status: DC
  Administered 2013-06-08 – 2013-06-12 (×8): 100 mg via ORAL
  Filled 2013-06-08 (×8): qty 1

## 2013-06-08 MED ORDER — STERILE WATER FOR IRRIGATION IR SOLN
Status: DC | PRN
  Administered 2013-06-08: 13:00:00

## 2013-06-08 MED ORDER — MIDAZOLAM HCL 5 MG/5ML IJ SOLN
INTRAMUSCULAR | Status: DC | PRN
  Administered 2013-06-08 (×2): 2 mg via INTRAVENOUS

## 2013-06-08 MED ORDER — LEVOTHYROXINE SODIUM 50 MCG PO TABS
50.0000 ug | ORAL_TABLET | Freq: Every day | ORAL | Status: DC
  Administered 2013-06-09 – 2013-06-12 (×4): 50 ug via ORAL
  Filled 2013-06-08 (×4): qty 1

## 2013-06-08 MED ORDER — ACETAMINOPHEN 650 MG RE SUPP: 650.0000 mg | Freq: Four times a day (QID) | RECTAL | Status: DC | PRN

## 2013-06-08 MED ORDER — BUTAMBEN-TETRACAINE-BENZOCAINE 2-2-14 % EX AERO
INHALATION_SPRAY | CUTANEOUS | Status: DC | PRN
  Administered 2013-06-08: 2 via TOPICAL

## 2013-06-08 MED ORDER — MINERAL OIL PO OIL
TOPICAL_OIL | ORAL | Status: AC
  Filled 2013-06-08: qty 30

## 2013-06-08 MED ORDER — SODIUM CHLORIDE 0.9 % IV SOLN
INTRAVENOUS | Status: DC
  Administered 2013-06-08 – 2013-06-11 (×8): via INTRAVENOUS

## 2013-06-08 MED ORDER — SODIUM CHLORIDE 0.9 % IJ SOLN
3.0000 mL | Freq: Two times a day (BID) | INTRAMUSCULAR | Status: DC
  Administered 2013-06-09 – 2013-06-12 (×5): 3 mL via INTRAVENOUS

## 2013-06-08 MED ORDER — MIDAZOLAM HCL 5 MG/5ML IJ SOLN
INTRAMUSCULAR | Status: AC
  Filled 2013-06-08: qty 10

## 2013-06-08 NOTE — Telephone Encounter (Signed)
Ezekiel Ina for the lab called this morning with Pt's HGB:6.9 and HCT:21.9. These labs was drawn on 06/07/13 at 1300 and repeated. Please advise

## 2013-06-08 NOTE — Progress Notes (Signed)
Report called To Lavenia Atlas, RN.Pt transported to room 301 via wheelchair in stable condition.

## 2013-06-08 NOTE — H&P (Signed)
[facility] History and Physical  Sonya [name] [id number] DOB: 29-Aug-1950 DOA: 06/08/2013  Referring physician: Dr. Oneida Alar, gastroenterology PCP: [name], MD  Specialists:   Chief Complaint: Severe hypertension, lab abnormalities  HPI: Sonya [name] is a 62 y.o. female who presented to the hospital today to undergo endoscopy for further evaluation of her persistent vomiting and difficulty swallowing. She was seen by Dr. Oneida Alar in the office yesterday and was noted to be severely hypertensive with a blood pressure of 224/108. She was advised to present to the emergency room for evaluation, patient declined. She was given clonidine in the office with improvement of her blood pressure was sent home. Preop lab work was drawn which revealed a hemoglobin 6.9 as well as a creatinine of 4.4. The patient has a history of chronic anemia with baseline hemoglobin of 8 and 9. She's also had chronic kidney disease with prior creatinine of 1.9 in 2010. The patient reports that she was previously taking blood pressure medications which included hydrochlorothiazide, triamterene, bisoprolol. These medications are being adjusted, but she reported many of these medications are Effexor her kidneys and therefore were being interchanged. She reports experiencing dysphasia and regurgitation with most foods for the past several weeks and has been unable to tolerate any medications. She's not taken any blood pressure medications and that time. She's also noticed for the past several weeks she has become increasingly weak, lightheaded, short of breath on exertion. She's not noticed any frank bleeding. She's not had any hematemesis, melena or hematochezia. Her bowel movements have been normal. She's not had any hematuria, dysuria, cough, fever, chest pain. Due to her abnormal blood work and elevated blood pressures, the patient was referred as a direct admission to the hospitalist service.  Review of Systems:  Pertinent positives as per history of present illness, otherwise negative  Past Medical History  Diagnosis Date  . HTN (hypertension)   . Hypothyroidism   . Uterine cancer 2012   Past Surgical History  Procedure Laterality Date  . Cholecystectomy    . Cesarean section    . Laparoscopic total hysterectomy    . Colonoscopy  2000    [facility], [facility]  . Colonoscopy  2003 NUR BRBPR D50 V6    [facility]/[place] [facility], [facility]  . Abdominal hysterectomy    . Nasal septum surgery     Social History:  reports that she has never smoked. She does not have any smokeless tobacco history on file. She reports that she does not drink alcohol or use illicit drugs.   Allergies  Allergen Reactions  . Cephalexin     Family History  Problem Relation Age of Onset  . Colon cancer Neg Hx     Prior to Admission medications   Medication Sig Start Date End Date Taking? Authorizing Provider  allopurinol (ZYLOPRIM) 100 MG tablet Take 100 mg by mouth daily.   Yes Historical Provider, MD  aspirin 81 MG tablet Take 81 mg by mouth daily.     Yes Historical Provider, MD  bisoprolol-hydrochlorothiazide (ZIAC) 2.5-6.25 MG per tablet Take 1 tablet by mouth daily.     Yes Historical Provider, MD  docusate sodium (COLACE) 100 MG capsule Take 100 mg by mouth 2 (two) times daily.   Yes Historical Provider, MD  ferrous sulfate dried (SLOW FE) 160 (50 FE) MG TBCR Take 160 mg by mouth daily.     Yes Historical Provider, MD  furosemide (LASIX) 20 MG tablet Take 20 mg by mouth.   Yes Historical  Provider, MD  ibuprofen (ADVIL,MOTRIN) 200 MG tablet Take 200 mg by mouth every 6 (six) hours as needed.   Yes Historical Provider, MD  indomethacin (INDOCIN) 50 MG capsule Take 50 mg by mouth 2 (two) times daily with a meal.   Yes Historical Provider, MD  levothyroxine (SYNTHROID, LEVOTHROID) 50 MCG tablet Take 50 mcg by mouth daily.     Yes Historical Provider, MD  MECLIZINE HCL PO Take 20 mg by mouth as needed.    Yes Historical Provider, MD   potassium chloride SA (K-DUR,KLOR-CON) 20 MEQ tablet Take 20 mEq by mouth 2 (two) times daily.   Yes Historical Provider, MD  triamterene-hydrochlorothiazide (DYAZIDE) 37.5-25 MG per capsule Take 1 capsule by mouth every morning.     Yes Historical Provider, MD  verapamil (CALAN) 120 MG tablet Take 120 mg by mouth once.   Yes Historical Provider, MD  omeprazole (PRILOSEC) 20 MG capsule 1 po every morning 30 minutes prior to your first meal. 06/08/13   Danie Binder, MD   Physical Exam: Filed Vitals:   06/08/13 1500  BP: 182/85  Pulse: 81  Temp: 97.2 F (36.2 C)  Resp: 16     General:  No acute distress  Eyes: Pupils are equal, round, reactive to light  ENT: Mucous membranes are moist  Neck: Supple  Cardiovascular: S1, S2, regular rate and rhythm  Respiratory: Clear to auscultation bilaterally  Abdomen: Soft, nontender, positive bowel sounds  Skin: No rashes  Musculoskeletal: No pedal edema bilaterally  Psychiatric: Normal affect, cooperative with exam  Neurologic: Grossly intact, nonfocal  Labs on Admission:  Basic Metabolic Panel:  Recent Labs Lab 06/07/13 1102 06/08/13 1813  NA 141 139  K 3.7 3.2*  CL 108 105  CO2 21 24  GLUCOSE 104* 96  BUN 33* 39*  CREATININE 4.40* 5.31*  CALCIUM 9.4 9.3   Liver Function Tests:  Recent Labs Lab 06/07/13 1102  AST 13  ALT <8  ALKPHOS 53  BILITOT 0.3  PROT 6.4  ALBUMIN 3.5   No results found for this basename: LIPASE, AMYLASE,  in the last 168 hours No results found for this basename: AMMONIA,  in the last 168 hours CBC:  Recent Labs Lab 06/07/13 1311 06/08/13 1813  WBC 6.4 5.4  NEUTROABS 3.8  --   HGB 6.9* 6.1*  HCT 21.9* 19.6*  MCV 71.8* 73.7*  PLT 279 181   Cardiac Enzymes: No results found for this basename: CKTOTAL, CKMB, CKMBINDEX, TROPONINI,  in the last 168 hours  BNP (last 3 results) No results found for this basename: PROBNP,  in the last 8760 hours CBG: No results found for this  basename: GLUCAP,  in the last 168 hours  Radiological Exams on Admission: Dg Chest 2 View  06/07/2013   CLINICAL DATA:  Dysphagia  EXAM: CHEST  2 VIEW  COMPARISON:  05/08/2010  FINDINGS: Cardiac silhouette is normal in size. The aorta is mildly uncoiled and tortuous. No mediastinal or hilar masses. Clear lungs. No pleural effusion or pneumothorax.  The bony thorax is intact.  IMPRESSION: No active cardiopulmonary disease.   Electronically Signed   By: Lajean Manes M.D.   On: 06/07/2013 13:56   Assessment/Plan Active Problems:   HTN (hypertension)   Anemia in chronic kidney disease   Acute on chronic kidney disease, stage 3   Regurgitation   Hypokalemia   Gout   1. Acute on chronic renal failure. It is unclear what the patient's most recent baseline creatinine is.  Her acute failure is likely secondary to uncontrolled blood pressure, Lasix/hydrochlorothiazide, as well as taking as needed indomethacin and ibuprofen for gout. The patient will be hydrated with IV fluids. We will check urinalysis as well as renal ultrasound. We will request a nephrology consultation. 2. Anemia, likely due to chronic disease. She does not have any evidence of bleeding. She does have a significant microcytosis which likely relates to iron deficiency. We will check an anemia panel, check stool for occult blood. She has undergone EGD today and per Dr. Oneida Alar, there was no obvious source of bleeding. Will defer to GI whether colonoscopy is needed. We will transfuse 2 units of PRBC for symptomatic anemia. 3. Uncontrolled hypertension. Patient was started on scheduled hydralazine. We'll consider adding beta blocker if her heart rate tolerates. She's not a candidate for ACE inhibitor/ARB. 4. Dysphagia with vomiting. ? Possible esophageal motility issue. We'll defer any further workup for GI. 5. Hypokalemia. Replace  Code Status: full code Family Communication: discussed with patient Disposition Plan: discharge home once  improved  Time spent: 26mins  Phillip Maffei [facility] Pager [phone]  If 7PM-7AM, please contact night-coverage [email or web address] Password [facility] 06/08/2013, 7:39 PM

## 2013-06-08 NOTE — Interval H&P Note (Signed)
History and Physical Interval Note:  06/08/2013 11:33 AM  Sonya [name]  has presented today for surgery, with the diagnosis of DYSPHAGIA  AND WEIGHT LOSS  The various methods of treatment have been discussed with the patient and family. After consideration of risks, benefits and other options for treatment, the patient has consented to  Procedure(s) with comments: ESOPHAGOGASTRODUODENOSCOPY (EGD) WITH ESOPHAGEAL DILATION (N/A) - 12:30 as a surgical intervention .  The patient's history has been reviewed, patient examined, no change in status, stable for surgery.  I have reviewed the patient's chart and labs.  Questions were answered to the patient's satisfaction.     [employer]

## 2013-06-08 NOTE — Telephone Encounter (Signed)
Dr. Oneida Alar returned page and was informed. Will be here shortly.

## 2013-06-08 NOTE — Telephone Encounter (Signed)
I paged Dr.Fields.

## 2013-06-08 NOTE — Progress Notes (Signed)
Pt's BP in pre-op prior to EGD/ED was 198/105, HR 84. Pt c/o headache and dizziness. Pt said she has been unable to take her BP pills for the past 3 weeks because she couldn't swallow. Pt was seen in Dr. Nona Dell office yesterday [id number] and pt's diastolic BP was over [id number]. Pt said Dr. Nona Dell gave her Clonidine to take because it was a smaller pill. Pt has taken the Clonidine once yesterday and once today at 0800. Dr. Oneida Alar was notified by Rosina Lowenstein, RN and Dr. Oneida Alar said she was aware and no further orders were given. Will continue to monitor.

## 2013-06-08 NOTE — H&P (View-Only) (Signed)
Subjective:    Patient ID: Sonya [name], female    DOB: Apr 06, 1951, 62 y.o.   MRN: [id number]  [name], MD  HPI Unable to keep take pills for 3 weeks. TAKES THEM BUT COMES RIGHT BACK. CAN ONLY TOLERATE TOAST AND JELLO. UNINTENTIONAL WEIGHT LOSS: 2012 197 LBS. NO ETOH, SMOKE, DIP, OR CHEW. DIDN'T GO TO ED BECAUSE SHE OWES CONE >$400 PER MONTH. FEELS COLD FOR PAST 3 WEEKS. FEELS SICK AT HER STOMACH. PILLS GO DOWN BUT COME RIGHT BACK UP. THEN GAGGING AND THROWING UP. WHEN SHE BELCHES ALL SHE CAN TASTE IS ACID. BMs: NO BM. DOES WHEN SHE GETS AND MOVES AROUND. LAST ENDO FELT LIKE THE WALLS WERE COMING IN ON HER. PT DENIES FEVER, CHILLS, BRBPR, melena, diarrhea, constipation, OR abd pain. C/O heartburn or indigestion. NO ASPIRIN, BC/GOODY POWDERS, IBUPROFEN/MOTRIN, OR NAPROXEN/ALEVE. SML GOITER. NO THYROID CA.   Past Medical History  Diagnosis Date  . HTN (hypertension)   . Hypothyroidism   . Uterine cancer 2012   Past Surgical History  Procedure Laterality Date  . Cholecystectomy    . Cesarean section    . Laparoscopic total hysterectomy    . Colonoscopy  2000    [facility], [facility]  . Colonoscopy  2003 NUR BRBPR D50 V6    [facility]/[place] T[facility], [facility]    Allergies  Allergen Reactions  . Cephalexin    Pt not taking any meds DUE TO DYSPHAGIA. Current Outpatient Prescriptions  Medication Sig Dispense Refill  . allopurinol (ZYLOPRIM) 100 MG tablet Take 100 mg by mouth daily.      Marland Kitchen aspirin 81 MG tablet Take 81 mg by mouth daily.        Marland Kitchen docusate sodium (COLACE) 100 MG capsule Take 100 mg by mouth 2 (two) times daily.      . ferrous sulfate dried (SLOW FE) 160 (50 FE) MG TBCR Take 160 mg by mouth daily.        . furosemide (LASIX) 20 MG tablet Take 20 mg by mouth.      Marland Kitchen ibuprofen (ADVIL,MOTRIN) 200 MG tablet Take 200 mg by mouth every 6 (six) hours as needed.      . indomethacin (INDOCIN) 50 MG capsule Take 50 mg by mouth 2 (two) times daily with a meal.      . levothyroxine (SYNTHROID,  LEVOTHROID) 50 MCG tablet Take 50 mcg by mouth daily.        Marland Kitchen MECLIZINE HCL PO Take 20 mg by mouth as needed.       . potassium chloride SA (K-DUR,KLOR-CON) 20 MEQ tablet Take 20 mEq by mouth 2 (two) times daily.      . verapamil (CALAN) 120 MG tablet Take 120 mg by mouth once.      .        .         Family History  Problem Relation Age of Onset  . 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   Review of Systems PER HPI OTHERWISE ALL SYSTEMS ARE NEGATIVE.    Objective:   Physical Exam  Vitals reviewed. Constitutional: She is oriented to person, place, and time. She appears well-nourished. No distress.  HENT:  Head: Normocephalic and atraumatic.  Mouth/Throat: Oropharynx is clear and moist. No oropharyngeal exudate.  Eyes: Pupils are equal, round, and reactive to light. No scleral icterus.  Neck: Normal range of motion. Neck supple.  Cardiovascular: Normal rate, regular rhythm and normal heart sounds.   Pulmonary/Chest: Effort normal and breath sounds normal. No respiratory distress.  Abdominal: Soft. Bowel sounds are normal. She exhibits no distension. There is no tenderness.  Musculoskeletal: Normal range of motion. She exhibits no edema.  Lymphadenopathy:    She has no cervical adenopathy.  Neurological: She is alert and oriented to person, place, and time.  NO FOCAL DEFICITS   Psychiatric: She has a normal mood and affect.          Assessment & Plan:

## 2013-06-08 NOTE — Progress Notes (Signed)
CRITICAL VALUE ALERT  Critical value received:  Hemoglobin 6.1  Date of notification:  06/08/13  Time of notification:  [id number]  Critical value read back:yes  Nurse who received alert:  A. Stann Mainland, RN  MD notified (1st page):  Notified Dr Roderic Palau at [phone] while he was rounding on floor. Stated will address and complete admission orders.   Time of first page:    MD notified (2nd page):  Time of second page:  Responding MD:    Time MD responded:

## 2013-06-09 ENCOUNTER — Inpatient Hospital Stay (HOSPITAL_COMMUNITY): Payer: Medicaid Other

## 2013-06-09 LAB — BASIC METABOLIC PANEL
BUN: 37 mg/dL — ABNORMAL HIGH (ref 6–23)
Calcium: 9.2 mg/dL (ref 8.4–10.5)
Creatinine, Ser: 4.84 mg/dL — ABNORMAL HIGH (ref 0.50–1.10)
GFR calc non Af Amer: 9 mL/min — ABNORMAL LOW (ref >90)
Glucose, Bld: 104 mg/dL — ABNORMAL HIGH (ref 70–99)
Potassium: 3.6 mEq/L (ref 3.5–5.1)
Sodium: 139 mEq/L (ref 135–145)

## 2013-06-09 LAB — IRON AND TIBC
Saturation Ratios: 15 % — ABNORMAL LOW (ref 20–55)
TIBC: 145 ug/dL — ABNORMAL LOW (ref 250–470)
UIBC: 123 ug/dL — ABNORMAL LOW (ref 125–400)

## 2013-06-09 LAB — FERRITIN: Ferritin: 436 ng/mL — ABNORMAL HIGH (ref 10–291)

## 2013-06-09 LAB — CBC
MCH: 24.9 pg — ABNORMAL LOW (ref 26.0–34.0)
MCHC: 32.2 g/dL (ref 30.0–36.0)
MCV: 77.1 fL — ABNORMAL LOW (ref 78.0–100.0)
Platelets: 209 10*3/uL (ref 150–400)
RBC: 3.5 MIL/uL — ABNORMAL LOW (ref 3.87–5.11)
RDW: 18.8 % — ABNORMAL HIGH (ref 11.5–15.5)

## 2013-06-09 MED ORDER — HYDRALAZINE HCL 20 MG/ML IJ SOLN
10.0000 mg | Freq: Once | INTRAMUSCULAR | Status: AC
  Administered 2013-06-09: 10 mg via INTRAVENOUS
  Filled 2013-06-09: qty 1

## 2013-06-09 MED ORDER — HYDRALAZINE HCL 20 MG/ML IJ SOLN
INTRAMUSCULAR | Status: AC
  Filled 2013-06-09: qty 1

## 2013-06-09 MED ORDER — METOPROLOL TARTRATE 25 MG PO TABS
25.0000 mg | ORAL_TABLET | Freq: Two times a day (BID) | ORAL | Status: DC
  Administered 2013-06-09 (×2): 25 mg via ORAL
  Filled 2013-06-09 (×2): qty 1

## 2013-06-09 MED ORDER — PANTOPRAZOLE SODIUM 40 MG PO TBEC
40.0000 mg | DELAYED_RELEASE_TABLET | Freq: Every day | ORAL | Status: DC
  Administered 2013-06-09 – 2013-06-12 (×4): 40 mg via ORAL
  Filled 2013-06-09 (×4): qty 1

## 2013-06-09 NOTE — Consult Note (Signed)
Reason for Consult: Failure Referring Physician: Dr.Memon  Sonya [name] is an 62 y.o. female.  HPI: She is her a patient who has history of hypertension, hypothyroidism and chronic renal failure stage III presently came with complaints of nausea, vomiting of about 4 weeks duration. Patient was seen initially about 4-5 years ago for chronic renal failure thought to be secondary to hypertension. Since then patient most for followup and presently came with the above-mentioned problems. Presently her renal function has declined. Patient also states that she has lost about 20 pounds within the last 4 months. Presently she denies any difficulty in breathing but complains of some weakness.  Past Medical History  Diagnosis Date  . HTN (hypertension)   . Hypothyroidism   . Uterine cancer 2012    Past Surgical History  Procedure Laterality Date  . Cholecystectomy    . Cesarean section    . Laparoscopic total hysterectomy    . Colonoscopy  2000    [facility]  . Colonoscopy  2003 NUR BRBPR D50 V6    [facility]  . Abdominal hysterectomy    . Nasal septum surgery      Family History  Problem Relation Age of Onset  . Colon cancer Neg Hx     Social History:  reports that she has never smoked. She does not have any smokeless tobacco history on file. She reports that she does not drink alcohol or use illicit drugs.  Allergies:  Allergies  Allergen Reactions  . Cephalexin     Medications: I have reviewed the patient's current medications.  Results for orders placed during the hospital encounter of 06/08/13 (from the past 48 hour(s))  TYPE AND SCREEN     Status: None   Collection Time    06/08/13  6:04 PM      Result Value Range   ABO/RH(D) B POS     Antibody Screen NEG     Sample Expiration 06/11/2013     Unit Number [id number]     Blood Component Type RED CELLS,LR     Unit division 00     Status of Unit ISSUED     Transfusion Status OK TO TRANSFUSE     Crossmatch Result  Compatible     Unit Number [id number]     Blood Component Type RED CELLS,LR     Unit division 00     Status of Unit ISSUED,FINAL     Transfusion Status OK TO TRANSFUSE     Crossmatch Result Compatible    CBC     Status: Abnormal   Collection Time    06/08/13  6:13 PM      Result Value Range   WBC 5.4  4.0 - 10.5 K/uL   RBC 2.66 (*) 3.87 - 5.11 MIL/uL   Hemoglobin 6.1 (*) 12.0 - 15.0 g/dL   Comment: RESULT REPEATED AND VERIFIED     CRITICAL RESULT CALLED TO, READ BACK BY AND VERIFIED WITH:     A. ROGERS AT 1849 ON 06/08/13 BY S. VANHOORNE   HCT 19.6 (*) 36.0 - 46.0 %   MCV 73.7 (*) 78.0 - 100.0 fL   MCH 22.9 (*) 26.0 - 34.0 pg   MCHC 31.1  30.0 - 36.0 g/dL   RDW 19.7 (*) 11.5 - 15.5 %   Platelets 181  150 - 400 K/uL  BASIC METABOLIC PANEL     Status: Abnormal   Collection Time    06/08/13  6:13 PM  Result Value Range   Sodium 139  135 - 145 mEq/L   Potassium 3.2 (*) 3.5 - 5.1 mEq/L   Chloride 105  96 - 112 mEq/L   CO2 24  19 - 32 mEq/L   Glucose, Bld 96  70 - 99 mg/dL   BUN 39 (*) 6 - 23 mg/dL   Creatinine, Ser 5.31 (*) 0.50 - 1.10 mg/dL   Calcium 9.3  8.4 - 10.5 mg/dL   GFR calc non Af Amer 8 (*) >90 mL/min   GFR calc Af Amer 9 (*) >90 mL/min   Comment: (NOTE)     The eGFR has been calculated using the CKD EPI equation.     This calculation has not been validated in all clinical situations.     eGFR's persistently <90 mL/min signify possible Chronic Kidney     Disease.  VITAMIN B12     Status: None   Collection Time    06/08/13  6:13 PM      Result Value Range   Vitamin B-12 481  211 - 911 pg/mL   Comment: Performed at [facility]     Status: None   Collection Time    06/08/13  6:13 PM      Result Value Range   Folate 5.7     Comment: (NOTE)     Reference Ranges            Deficient:       0.4 - 3.3 ng/mL            Indeterminate:   3.4 - 5.4 ng/mL            Normal:              > 5.4 ng/mL     Performed at [facility] TIBC     Status: Abnormal   Collection Time    06/08/13  6:13 PM      Result Value Range   Iron 22 (*) 42 - 135 ug/dL   TIBC 145 (*) 250 - 470 ug/dL   Saturation Ratios 15 (*) 20 - 55 %   UIBC 123 (*) 125 - 400 ug/dL   Comment: Performed at [place] [facility]     Status: Abnormal   Collection Time    06/08/13  6:13 PM      Result Value Range   Ferritin 436 (*) 10 - 291 ng/mL   Comment: Performed at [place] [facility]     Status: Abnormal   Collection Time    06/08/13  6:13 PM      Result Value Range   Retic Ct Pct 2.0  0.4 - 3.1 %   RBC. 2.66 (*) 3.87 - 5.11 MIL/uL   Retic Count, Manual 53.2  19.0 - 186.0 K/uL  TSH     Status: None   Collection Time    06/08/13  6:13 PM      Result Value Range   TSH 2.236  0.350 - 4.500 uIU/mL   Comment: Performed at [facility] RBC (CROSSMATCH)     Status: None   Collection Time    06/08/13  7:35 PM      Result Value Range   Order Confirmation ORDER PROCESSED BY BLOOD BANK    URINALYSIS, ROUTINE W REFLEX MICROSCOPIC     Status: Abnormal   Collection Time    06/08/13 10:22 PM  Result Value Range   Color, Urine YELLOW  YELLOW   APPearance CLEAR  CLEAR   Specific Gravity, Urine 1.015  1.005 - 1.030   pH 6.0  5.0 - 8.0   Glucose, UA NEGATIVE  NEGATIVE mg/dL   Hgb urine dipstick SMALL (*) NEGATIVE   Bilirubin Urine NEGATIVE  NEGATIVE   Ketones, ur NEGATIVE  NEGATIVE mg/dL   Protein, ur 100 (*) NEGATIVE mg/dL   Urobilinogen, UA 0.2  0.0 - 1.0 mg/dL   Nitrite NEGATIVE  NEGATIVE   Leukocytes, UA NEGATIVE  NEGATIVE  URINE MICROSCOPIC-ADD ON     Status: Abnormal   Collection Time    06/08/13 10:22 PM      Result Value Range   Squamous Epithelial / LPF FEW (*) RARE   WBC, UA 0-2  <3 WBC/hpf   RBC / HPF 0-2  <3 RBC/hpf   Bacteria, UA RARE  RARE  BASIC METABOLIC PANEL     Status: Abnormal   Collection Time    06/09/13  5:55 AM      Result Value Range   Sodium 139  135 - 145 mEq/L    Potassium 3.6  3.5 - 5.1 mEq/L   Chloride 108  96 - 112 mEq/L   CO2 20  19 - 32 mEq/L   Glucose, Bld 104 (*) 70 - 99 mg/dL   BUN 37 (*) 6 - 23 mg/dL   Creatinine, Ser 4.84 (*) 0.50 - 1.10 mg/dL   Calcium 9.2  8.4 - 10.5 mg/dL   GFR calc non Af Amer 9 (*) >90 mL/min   GFR calc Af Amer 10 (*) >90 mL/min   Comment: (NOTE)     The eGFR has been calculated using the CKD EPI equation.     This calculation has not been validated in all clinical situations.     eGFR's persistently <90 mL/min signify possible Chronic Kidney     Disease.  CBC     Status: Abnormal   Collection Time    06/09/13  5:55 AM      Result Value Range   WBC 7.2  4.0 - 10.5 K/uL   RBC 3.50 (*) 3.87 - 5.11 MIL/uL   Hemoglobin 8.7 (*) 12.0 - 15.0 g/dL   Comment: DELTA CHECK NOTED   HCT 27.0 (*) 36.0 - 46.0 %   MCV 77.1 (*) 78.0 - 100.0 fL   MCH 24.9 (*) 26.0 - 34.0 pg   MCHC 32.2  30.0 - 36.0 g/dL   RDW 18.8 (*) 11.5 - 15.5 %   Platelets 209  150 - 400 K/uL   Comment: SPECIMEN CHECKED FOR CLOTS     PLATELET COUNT CONFIRMED BY SMEAR    Dg Chest 2 View  06/07/2013   CLINICAL DATA:  Dysphagia  EXAM: CHEST  2 VIEW  COMPARISON:  05/08/2010  FINDINGS: Cardiac silhouette is normal in size. The aorta is mildly uncoiled and tortuous. No mediastinal or hilar masses. Clear lungs. No pleural effusion or pneumothorax.  The bony thorax is intact.  IMPRESSION: No active cardiopulmonary disease.   Electronically Signed   By: Lajean Manes M.D.   On: 06/07/2013 13:56   US Renal  06/09/2013   CLINICAL DATA:  Acute on chronic renal failure  EXAM: RENAL/URINARY TRACT ULTRASOUND COMPLETE  COMPARISON:  None.  FINDINGS: Right Kidney  Length: 10.4 cm. The renal cortex is increased in echogenicity. There are to anechoic areas within the upper pole measuring 9 x 8 x 9 mm and 1.3  x 1.1 x 1.4 cm with increased through transmission most consistent with tiny cysts.  Left Kidney  Length: 12.9 cm. The renal cortex is increased in echogenicity.  There are two anechoic areas within the left kidney. There is an anechoic area in the upper pole measuring 1.8 x 1.5 x 1.7 cm most consistent with a cyst. There is a 1.2 x 1.2 x 1.2 cm anechoic area in the left midpole likely reflecting a cyst.  Bladder  Appears normal for degree of bladder distention.  IMPRESSION: 1.  No obstructive uropathy.  2. Increased renal cortical echogenicity bilaterally as can be seen with medical renal disease.  3.  Small bilateral renal cysts.   Electronically Signed   By: Kathreen Devoid   On: 06/09/2013 09:59    Review of Systems  Constitutional: Positive for weight loss.  Cardiovascular: Negative for orthopnea and leg swelling.  Gastrointestinal: Positive for nausea, vomiting and abdominal pain. Negative for diarrhea.   Blood pressure 188/89, pulse 95, temperature 98.5 F (36.9 C), temperature source Oral, resp. rate 16, height 5\' 6"  (1.676 m), weight 86.7 kg (191 lb 2.2 oz), SpO2 100.00%. Physical Exam  HENT:  Mouth/Throat: Oropharyngeal exudate present.  Eyes: No scleral icterus.  Neck: No JVD present.  Cardiovascular: Normal rate and regular rhythm.   No murmur heard. Respiratory: She has no wheezes. She has no rales.  GI: She exhibits no distension. There is no tenderness. There is no rebound.  Musculoskeletal: She exhibits no edema.    Assessment/Plan: Problem #1 renal failure possibly acute on chronic. The etiology could be secondary to prerenal syndrome /ATN/NSAID. Patient presently with nausea and vomiting and also some abdominal pain. She claims she's feeling better. Her BUN and creatinine seems to be slightly better. Problem #2 chronic renal failure stage III. 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 Problem #3 accelerated hypertension. Her blood pressure still high. Patient also taking her antihypertensive medication as outpatient because her she wouldn't work it. Problem #4 anemia. Etiology not clear possibly secondary to chronic  renal failure. However iron deficiency anemia cannot ruled out. Presently iron deficiency anemia also need to put into differential diagnosis. Problem #5 nausea, vomiting and abdominal pain. This could be primarily secondary to GI problem however uremia may contribute to the problem. Problem #6 history of gout Problem #7 history of hypothyroidism. Plan: Agree with hydration and will increase her IV fluid to 125 cc per hour           We'll check ultrasound of her kidneys.           We'll check intact PTH, 25 vitamin D, phosphorus level in the morning.           We'll check also iron studies in the morning.  Quanell Loughney S 06/09/2013, 10:18 AM

## 2013-06-09 NOTE — Plan of Care (Signed)
Problem: Phase I Progression Outcomes Goal: OOB as tolerated unless otherwise ordered Outcome: Completed/Met Date Met:  06/09/13 06/09/13 1322 Patient assisted up to chair this morning, ambulates in room independently. Instructed to call for assistance as needed.  Donavan Foil, RN

## 2013-06-09 NOTE — Progress Notes (Signed)
06/09/13 1700 Patient had BP of 184/100 manually at 1644. Text-paged Dr. Roderic Palau to notify. Donavan Foil, RN

## 2013-06-09 NOTE — Telephone Encounter (Signed)
REVIEWED.  

## 2013-06-09 NOTE — Progress Notes (Signed)
[facility] PROGRESS NOTE  Sonya [name] [id number] DOB: 1951/03/06 DOA: 06/08/2013 PCP: [name], MD  Assessment/Plan: 1. Anemia of chronic disease.  S/p 2 units of prbc.  No obvious signs of bleeding. Ferritin is high and TIBC is low. Continue to follow. 2. Acute on chronic kidney disease.  Creatinine was 5.3 on admission. Possibly related to ATN from hypertension, NSAID use. Renal function appears to be mildly better today.  Nephrology input is appreciated. 3. Uncontrolled hypertension.  Started on hydralazine and lopressor.  Continue to follow. 4. Vomiting and dysphagia.  Patient reports some improvement today. Likely due to esophageal stricture that was dilated. Able to tolerate oral meds.  Wants to advance diet. 5. Hypokalemia. Improved. 6. Gout stable  Code Status: full code Family Communication: discussed with patient and family in room Disposition Plan: discharge home once improved   Consultants: Nephrology  Procedures: EGD on 11/7: 1. Stricture at the gastroesophageal junction  2. MILD NON-erosive gastritis (inflammation) was found in the  gastric antrum; multiple biopsies  3. NO SOURCE FOR ANEMIA IDETIFIED-MOST LIKELY DUE TO ANEMIA OF  CHRONIC DISEASE    Antibiotics:  none  HPI/Subjective: Complains of sore throat with swallowing that is helped by drinking warm liquids.  Objective: Filed Vitals:   06/09/13 0532  BP: 188/89  Pulse: 95  Temp: 98.5 F (36.9 C)  Resp: 16    Intake/Output Summary (Last 24 hours) at 06/09/13 1209 Last data filed at 06/09/13 0819  Gross per 24 hour  Intake    228 ml  Output      0 ml  Net    228 ml   Filed Weights   06/08/13 1500 06/09/13 0500  Weight: 82.3 kg (181 lb 7 oz) 86.7 kg (191 lb 2.2 oz)    Exam:   General:  NAD  Cardiovascular: S1, S2 RRR  Respiratory: CTA B  Abdomen: Soft, nt, nd, bs+  Musculoskeletal: no edema b/l   Data Reviewed: Basic Metabolic Panel:  Recent Labs Lab  06/07/13 1102 06/08/13 1813 06/09/13 0555  NA 141 139 139  K 3.7 3.2* 3.6  CL 108 105 108  CO2 21 24 20   GLUCOSE 104* 96 104*  BUN 33* 39* 37*  CREATININE 4.40* 5.31* 4.84*  CALCIUM 9.4 9.3 9.2   Liver Function Tests:  Recent Labs Lab 06/07/13 1102  AST 13  ALT <8  ALKPHOS 53  BILITOT 0.3  PROT 6.4  ALBUMIN 3.5   No results found for this basename: LIPASE, AMYLASE,  in the last 168 hours No results found for this basename: AMMONIA,  in the last 168 hours CBC:  Recent Labs Lab 06/07/13 1311 06/08/13 1813 06/09/13 0555  WBC 6.4 5.4 7.2  NEUTROABS 3.8  --   --   HGB 6.9* 6.1* 8.7*  HCT 21.9* 19.6* 27.0*  MCV 71.8* 73.7* 77.1*  PLT 279 181 209   Cardiac Enzymes: No results found for this basename: CKTOTAL, CKMB, CKMBINDEX, TROPONINI,  in the last 168 hours BNP (last 3 results) No results found for this basename: PROBNP,  in the last 8760 hours CBG: No results found for this basename: GLUCAP,  in the last 168 hours  No results found for this or any previous visit (from the past 240 hour(s)).   Studies: Dg Chest 2 View  06/07/2013   CLINICAL DATA:  Dysphagia  EXAM: CHEST  2 VIEW  COMPARISON:  05/08/2010  FINDINGS: Cardiac silhouette is normal in size. The aorta is mildly uncoiled and tortuous.  No mediastinal or hilar masses. Clear lungs. No pleural effusion or pneumothorax.  The bony thorax is intact.  IMPRESSION: No active cardiopulmonary disease.   Electronically Signed   By: Lajean Manes M.D.   On: 06/07/2013 13:56   US Renal  06/09/2013   CLINICAL DATA:  Acute on chronic renal failure  EXAM: RENAL/URINARY TRACT ULTRASOUND COMPLETE  COMPARISON:  None.  FINDINGS: Right Kidney  Length: 10.4 cm. The renal cortex is increased in echogenicity. There are to anechoic areas within the upper pole measuring 9 x 8 x 9 mm and 1.3 x 1.1 x 1.4 cm with increased through transmission most consistent with tiny cysts.  Left Kidney  Length: 12.9 cm. The renal cortex is increased in  echogenicity. There are two anechoic areas within the left kidney. There is an anechoic area in the upper pole measuring 1.8 x 1.5 x 1.7 cm most consistent with a cyst. There is a 1.2 x 1.2 x 1.2 cm anechoic area in the left midpole likely reflecting a cyst.  Bladder  Appears normal for degree of bladder distention.  IMPRESSION: 1.  No obstructive uropathy.  2. Increased renal cortical echogenicity bilaterally as can be seen with medical renal disease.  3.  Small bilateral renal cysts.   Electronically Signed   By: Kathreen Devoid   On: 06/09/2013 09:59    Scheduled Meds: . docusate sodium  100 mg Oral BID  . hydrALAZINE  50 mg Oral Q8H  . levothyroxine  50 mcg Oral QAC breakfast  . metoprolol tartrate  25 mg Oral BID  . sodium chloride  3 mL Intravenous Q12H   Continuous Infusions: . sodium chloride 125 mL/hr at 06/09/13 1050    Active Problems:   HTN (hypertension)   Anemia in chronic kidney disease   Acute on chronic kidney disease, stage 3   Regurgitation   Hypokalemia   Gout    Time spent: 43mins    MEMON,JEHANZEB  [facility] Pager [phone]. If 7PM-7AM, please contact night-coverage at [email or web address], password [facility] 06/09/2013, 12:09 PM  LOS: 1 day

## 2013-06-09 NOTE — Op Note (Addendum)
[facility] [address] [place], [address]   ENDOSCOPY PROCEDURE REPORT  PATIENT: Sonya, [name]  MR#: [id number] BIRTHDATE: 1950-11-25 , 62  yrs. old GENDER: Female  ENDOSCOPIST: Barney Drain, MD REFFERED [id number] Gerarda Fraction, M.D.  PROCEDURE DATE:  06/08/2013 PROCEDURE:   EGD with biopsy and EGD with dilatation over guidewire   INDICATIONS:1.  dyspepsia.   2.  dysphagia. LABS: NOV 6 Cr 4.44 Hb 6.9 MEDICATIONS: Demerol 75 mg IV and Versed 5 mg IV TOPICAL ANESTHETIC: Cetacaine Spray  DESCRIPTION OF PROCEDURE:   After the risks benefits and alternatives of the procedure were thoroughly explained, informed consent was obtained.  The [id number]  endoscope was introduced through the mouth and advanced to the second portion of the duodenum. The instrument was slowly withdrawn as the mucosa was carefully examined.  Prior to withdrawal of the scope, the guidwire was placed.  The esophagus was dilated successfully.  The patient was recovered in endoscopy and discharged home in satisfactory condition. PICTURES OF GASTRIC MUCOSA NOT AVAILABLE DUE TO SYSTME FAILURE.   ESOPHAGUS: A stricture was found at the gastroesophageal junction. The stenosis was traversable with the endoscope.   STOMACH: Mild non-erosive gastritis (inflammation) was found in the gastric antrum.  Multiple biopsies were performed using cold forceps TO EVAULATE FOR ATROPHC GASTRITIS.   DUODENUM: The duodenal mucosa showed no abnormalities in the bulb and second portion of the duodenum.   Dilation was then performed at the gastroesphageal junction Dilator: Savary over guidewire Size(s): 12.8-16 mm Resistance: minimal Heme: none  COMPLICATIONS: There were no complications.   ENDOSCOPIC IMPRESSION: 1.   Stricture at the gastroesophageal junction 2.   MILD NON-erosive gastritis (inflammation) was found in the gastric antrum; multiple biopsies 3.  NO SOURCE FOR ANEMIA IDETIFIED-MOST LIKELY  DUE TO ANEMIA OF CHRONIC DISEASE  RECOMMENDATIONS: ADMIT TO TELEMETRY SOFT DIET TRANSFUSE PRN HYDRATE AWIT BIOPSY DAILY PPI      _______________________________ eSignedBarney Drain, MD 06/09/2013 10:55 AM      PATIENT NAME:  Sonya [name], Sonya [name] MR#: [id number]

## 2013-06-10 LAB — CBC
HCT: 27.7 % — ABNORMAL LOW (ref 36.0–46.0)
Hemoglobin: 9 g/dL — ABNORMAL LOW (ref 12.0–15.0)
MCHC: 32.5 g/dL (ref 30.0–36.0)
MCV: 76.7 fL — ABNORMAL LOW (ref 78.0–100.0)
Platelets: ADEQUATE 10*3/uL (ref 150–400)
RDW: 19.2 % — ABNORMAL HIGH (ref 11.5–15.5)

## 2013-06-10 LAB — TYPE AND SCREEN
ABO/RH(D): B POS
Antibody Screen: NEGATIVE
Unit division: 0
Unit division: 0

## 2013-06-10 LAB — BASIC METABOLIC PANEL
BUN: 30 mg/dL — ABNORMAL HIGH (ref 6–23)
CO2: 19 mEq/L (ref 19–32)
Calcium: 9.2 mg/dL (ref 8.4–10.5)
Chloride: 106 mEq/L (ref 96–112)
Creatinine, Ser: 4.24 mg/dL — ABNORMAL HIGH (ref 0.50–1.10)
GFR calc Af Amer: 12 mL/min — ABNORMAL LOW (ref >90)

## 2013-06-10 MED ORDER — LABETALOL HCL 200 MG PO TABS
300.0000 mg | ORAL_TABLET | Freq: Two times a day (BID) | ORAL | Status: DC
  Administered 2013-06-10 – 2013-06-12 (×5): 300 mg via ORAL
  Filled 2013-06-10 (×5): qty 2

## 2013-06-10 NOTE — Progress Notes (Signed)
[facility] PROGRESS NOTE  Sonya [name] [id number] DOB: 1951-06-03 DOA: 06/08/2013 PCP: [name], MD  Assessment/Plan: 1. Anemia of chronic disease.  S/p 2 units of prbc.  No obvious signs of bleeding. Ferritin is high and TIBC is low. Continue to follow. 2. Acute on chronic kidney disease.  Creatinine was 5.3 on admission. Possibly related to ATN from hypertension, NSAID use. Renal function appears to be improving.  Nephrology input is appreciated. If renal function continues to improve, she can likely discharge home and follow up with nephrology as an outpatient 3. Uncontrolled hypertension.  On hydralazine.  Lopressor changed to labetalol. Continue to monitor. 4. Vomiting and dysphagia.  Patient reports some improvement today. Likely due to esophageal stricture that was dilated. Able to tolerate oral meds.   5. Hypokalemia. Improved. 6. Gout stable  Code Status: full code Family Communication: discussed with patient and husband in room Disposition Plan: discharge home once improved, likely in am.   Consultants: Nephrology  Procedures: EGD on 11/7: 1. Stricture at the gastroesophageal junction  2. MILD NON-erosive gastritis (inflammation) was found in the  gastric antrum; multiple biopsies  3. NO SOURCE FOR ANEMIA IDETIFIED-MOST LIKELY DUE TO ANEMIA OF  CHRONIC DISEASE    Antibiotics:  none  HPI/Subjective: Feeling dizzy, no other complaints  Objective: Filed Vitals:   06/10/13 1502  BP: 179/79  Pulse: 76  Temp: 98 F (36.7 C)  Resp: 18    Intake/Output Summary (Last 24 hours) at 06/10/13 1625 Last data filed at 06/10/13 1546  Gross per 24 hour  Intake 3472.17 ml  Output      0 ml  Net 3472.17 ml   Filed Weights   06/08/13 1500 06/09/13 0500 06/10/13 0600  Weight: 82.3 kg (181 lb 7 oz) 86.7 kg (191 lb 2.2 oz) 86.6 kg (190 lb 14.7 oz)    Exam:   General:  NAD  Cardiovascular: S1, S2 RRR  Respiratory: CTA B  Abdomen: Soft, nt,  nd, bs+  Musculoskeletal: no edema b/l   Data Reviewed: Basic Metabolic Panel:  Recent Labs Lab 06/07/13 1102 06/08/13 1813 06/09/13 0555 06/10/13 0541  NA 141 139 139 136  K 3.7 3.2* 3.6 3.5  CL 108 105 108 106  CO2 21 24 20 19   GLUCOSE 104* 96 104* 97  BUN 33* 39* 37* 30*  CREATININE 4.40* 5.31* 4.84* 4.24*  CALCIUM 9.4 9.3 9.2 9.2  PHOS  --   --   --  3.7   Liver Function Tests:  Recent Labs Lab 06/07/13 1102  AST 13  ALT <8  ALKPHOS 53  BILITOT 0.3  PROT 6.4  ALBUMIN 3.5   No results found for this basename: LIPASE, AMYLASE,  in the last 168 hours No results found for this basename: AMMONIA,  in the last 168 hours CBC:  Recent Labs Lab 06/07/13 1311 06/08/13 1813 06/09/13 0555 06/10/13 0541  WBC 6.4 5.4 7.2 6.7  NEUTROABS 3.8  --   --   --   HGB 6.9* 6.1* 8.7* 9.0*  HCT 21.9* 19.6* 27.0* 27.7*  MCV 71.8* 73.7* 77.1* 76.7*  PLT 279 181 209 PLATELET CLUMPS NOTED ON SMEAR, COUNT APPEARS ADEQUATE   Cardiac Enzymes: No results found for this basename: CKTOTAL, CKMB, CKMBINDEX, TROPONINI,  in the last 168 hours BNP (last 3 results) No results found for this basename: PROBNP,  in the last 8760 hours CBG: No results found for this basename: GLUCAP,  in the last 168 hours  No results found  for this or any previous visit (from the past 240 hour(s)).   Studies: US Renal  06/09/2013   CLINICAL DATA:  Acute on chronic renal failure  EXAM: RENAL/URINARY TRACT ULTRASOUND COMPLETE  COMPARISON:  None.  FINDINGS: Right Kidney  Length: 10.4 cm. The renal cortex is increased in echogenicity. There are to anechoic areas within the upper pole measuring 9 x 8 x 9 mm and 1.3 x 1.1 x 1.4 cm with increased through transmission most consistent with tiny cysts.  Left Kidney  Length: 12.9 cm. The renal cortex is increased in echogenicity. There are two anechoic areas within the left kidney. There is an anechoic area in the upper pole measuring 1.8 x 1.5 x 1.7 cm most consistent  with a cyst. There is a 1.2 x 1.2 x 1.2 cm anechoic area in the left midpole likely reflecting a cyst.  Bladder  Appears normal for degree of bladder distention.  IMPRESSION: 1.  No obstructive uropathy.  2. Increased renal cortical echogenicity bilaterally as can be seen with medical renal disease.  3.  Small bilateral renal cysts.   Electronically Signed   By: Kathreen Devoid   On: 06/09/2013 09:59    Scheduled Meds: . docusate sodium  100 mg Oral BID  . hydrALAZINE  50 mg Oral Q8H  . labetalol  300 mg Oral BID  . levothyroxine  50 mcg Oral QAC breakfast  . pantoprazole  40 mg Oral QAC supper  . sodium chloride  3 mL Intravenous Q12H   Continuous Infusions: . sodium chloride 125 mL/hr at 06/10/13 1338    Active Problems:   HTN (hypertension)   Anemia in chronic kidney disease   Acute on chronic kidney disease, stage 3   Regurgitation   Hypokalemia   Gout    Time spent: 9mins    MEMON,JEHANZEB  [facility] Pager [phone]. If 7PM-7AM, please contact night-coverage at [email or web address], password [facility] 06/10/2013, 4:25 PM  LOS: 2 days

## 2013-06-10 NOTE — Progress Notes (Signed)
Subjective: Interval History: has no complaint of nausea or vomiting. Presently patient complaints of weakness otherwise feels okay. She denies any difficulty in breathing..  Objective: Vital signs in last 24 hours: Temp:  [97.4 F (36.3 C)-98.5 F (36.9 C)] 97.4 F (36.3 C) (11/09 0600) Pulse Rate:  [72-79] 77 (11/09 0600) Resp:  [16-20] 18 (11/09 0600) BP: (184-206)/(93-102) 206/93 mmHg (11/09 0600) SpO2:  [99 %-100 %] 100 % (11/09 0600) Weight:  [86.6 kg (190 lb 14.7 oz)] 86.6 kg (190 lb 14.7 oz) (11/09 0600) Weight change: 4.3 kg (9 lb 7.7 oz)  Intake/Output from previous day: 11/08 0701 - 11/09 0700 In: 3231.3 [P.O.:720; I.V.:2511.3] Out: 450 [Urine:450] Intake/Output this shift:    General appearance: alert, cooperative and no distress Resp: clear to auscultation bilaterally Cardio: regular rate and rhythm, S1, S2 normal, no murmur, click, rub or gallop GI: soft, non-tender; bowel sounds normal; no masses,  no organomegaly Extremities: extremities normal, atraumatic, no cyanosis or edema  Lab Results:  Recent Labs  06/09/13 0555 06/10/13 0541  WBC 7.2 6.7  HGB 8.7* 9.0*  HCT 27.0* 27.7*  PLT 209 PLATELET CLUMPS NOTED ON SMEAR, COUNT APPEARS ADEQUATE   BMET:  Recent Labs  06/09/13 0555 06/10/13 0541  NA 139 136  K 3.6 3.5  CL 108 106  CO2 20 19  GLUCOSE 104* 97  BUN 37* 30*  CREATININE 4.84* 4.24*  CALCIUM 9.2 9.2   No results found for this basename: PTH,  in the last 72 hours Iron Studies:  Recent Labs  06/08/13 1813  IRON 22*  TIBC 145*  FERRITIN 436*    Studies/Results: US Renal  06/09/2013   CLINICAL DATA:  Acute on chronic renal failure  EXAM: RENAL/URINARY TRACT ULTRASOUND COMPLETE  COMPARISON:  None.  FINDINGS: Right Kidney  Length: 10.4 cm. The renal cortex is increased in echogenicity. There are to anechoic areas within the upper pole measuring 9 x 8 x 9 mm and 1.3 x 1.1 x 1.4 cm with increased through transmission most consistent with  tiny cysts.  Left Kidney  Length: 12.9 cm. The renal cortex is increased in echogenicity. There are two anechoic areas within the left kidney. There is an anechoic area in the upper pole measuring 1.8 x 1.5 x 1.7 cm most consistent with a cyst. There is a 1.2 x 1.2 x 1.2 cm anechoic area in the left midpole likely reflecting a cyst.  Bladder  Appears normal for degree of bladder distention.  IMPRESSION: 1.  No obstructive uropathy.  2. Increased renal cortical echogenicity bilaterally as can be seen with medical renal disease.  3.  Small bilateral renal cysts.   Electronically Signed   By: Kathreen Devoid   On: 06/09/2013 09:59    I have reviewed the patient's current medications.  Assessment/Plan: Problem #1 renal failure acute on chronic. Her BUN and creatinine is presently 30 and 4.24 respectively renal function seems to be improving. 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  Problem #2 hypertension her blood pressure is not very well controlled. Problem #3 anemia. Her iron saturation is low but ferritin is high possibly combination of iron deficiency and anemia of chronic disease. Problem #4 history of gout no recent activation Problem #5 metabolic bone disease her calcium and phosphorus is was in acceptable range. Problem #6 hypokalemia potassium has normalized. Problem #7 hypothyroidism. Plan: We'll DC metoprolol           Will start patient on labetalol 300 mg  2 twice a day           We'll check her basic metabolic panel in the morning.   LOS: 2 days   Jermiyah Ricotta S 06/10/2013,9:03 AM

## 2013-06-11 LAB — BASIC METABOLIC PANEL
BUN: 29 mg/dL — ABNORMAL HIGH (ref 6–23)
CO2: 18 mEq/L — ABNORMAL LOW (ref 19–32)
Calcium: 8.6 mg/dL (ref 8.4–10.5)
Creatinine, Ser: 4.27 mg/dL — ABNORMAL HIGH (ref 0.50–1.10)
GFR calc Af Amer: 12 mL/min — ABNORMAL LOW (ref >90)
GFR calc non Af Amer: 10 mL/min — ABNORMAL LOW (ref >90)
Glucose, Bld: 90 mg/dL (ref 70–99)
Potassium: 3.3 mEq/L — ABNORMAL LOW (ref 3.5–5.1)

## 2013-06-11 LAB — PTH, INTACT AND CALCIUM: PTH: 473.2 pg/mL — ABNORMAL HIGH (ref 14.0–72.0)

## 2013-06-11 MED ORDER — POLYSACCHARIDE IRON COMPLEX 150 MG PO CAPS
150.0000 mg | ORAL_CAPSULE | Freq: Every day | ORAL | Status: DC
  Administered 2013-06-11 – 2013-06-12 (×2): 150 mg via ORAL
  Filled 2013-06-11 (×2): qty 1

## 2013-06-11 MED ORDER — POTASSIUM CHLORIDE CRYS ER 20 MEQ PO TBCR
40.0000 meq | EXTENDED_RELEASE_TABLET | Freq: Once | ORAL | Status: AC
  Administered 2013-06-11: 40 meq via ORAL
  Filled 2013-06-11 (×2): qty 2

## 2013-06-11 MED ORDER — AMLODIPINE BESYLATE 5 MG PO TABS
5.0000 mg | ORAL_TABLET | Freq: Every day | ORAL | Status: DC
  Administered 2013-06-11 – 2013-06-12 (×2): 5 mg via ORAL
  Filled 2013-06-11 (×2): qty 1

## 2013-06-11 NOTE — Progress Notes (Signed)
Subjective: Interval History:  Presently patient complaints of weakness otherwise feels okay. She denies any difficulty in breathing..  Objective: Vital signs in last 24 hours: Temp:  [98 F (36.7 C)-99 F (37.2 C)] 99 F (37.2 C) (11/10 0546) Pulse Rate:  [76-93] 93 (11/10 0546) Resp:  [18-20] 20 (11/10 0546) BP: (164-183)/(75-83) 183/83 mmHg (11/10 0546) SpO2:  [100 %] 100 % (11/10 0546) Weight change:   Intake/Output from previous day: 11/09 0701 - 11/10 0700 In: 3685.5 [P.O.:720; I.V.:2965.5] Out: -  Intake/Output this shift:    General appearance: alert, cooperative and no distress Resp: clear to auscultation bilaterally Cardio: regular rate and rhythm, S1, S2 normal, no murmur, click, rub or gallop GI: soft, non-tender; bowel sounds normal; no masses,  no organomegaly Extremities: extremities normal, atraumatic, no cyanosis or edema  Lab Results:  Recent Labs  06/09/13 0555 06/10/13 0541  WBC 7.2 6.7  HGB 8.7* 9.0*  HCT 27.0* 27.7*  PLT 209 PLATELET CLUMPS NOTED ON SMEAR, COUNT APPEARS ADEQUATE   BMET:   Recent Labs  06/10/13 0541 06/11/13 0505  NA 136 140  K 3.5 3.3*  CL 106 111  CO2 19 18*  GLUCOSE 97 90  BUN 30* 29*  CREATININE 4.24* 4.27*  CALCIUM 9.2 8.6   No results found for this basename: PTH,  in the last 72 hours Iron Studies:   Recent Labs  06/08/13 1813  IRON 22*  TIBC 145*  FERRITIN 436*    Studies/Results: US Renal  06/09/2013   CLINICAL DATA:  Acute on chronic renal failure  EXAM: RENAL/URINARY TRACT ULTRASOUND COMPLETE  COMPARISON:  None.  FINDINGS: Right Kidney  Length: 10.4 cm. The renal cortex is increased in echogenicity. There are to anechoic areas within the upper pole measuring 9 x 8 x 9 mm and 1.3 x 1.1 x 1.4 cm with increased through transmission most consistent with tiny cysts.  Left Kidney  Length: 12.9 cm. The renal cortex is increased in echogenicity. There are two anechoic areas within the left kidney. There is an  anechoic area in the upper pole measuring 1.8 x 1.5 x 1.7 cm most consistent with a cyst. There is a 1.2 x 1.2 x 1.2 cm anechoic area in the left midpole likely reflecting a cyst.  Bladder  Appears normal for degree of bladder distention.  IMPRESSION: 1.  No obstructive uropathy.  2. Increased renal cortical echogenicity bilaterally as can be seen with medical renal disease.  3.  Small bilateral renal cysts.   Electronically Signed   By: Kathreen Devoid   On: 06/09/2013 09:59    I have reviewed the patient's current medications.  Assessment/Plan: Problem #1 renal failure acute on chronic. Her BUN and creatinine is presently 29 and 4.27 respectively renal function is stable. 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  Problem #2 hypertension her blood pressure is still high but improving Problem #3 anemia. Her iron saturation is low but ferritin is high possibly combination of iron deficiency and anemia of chronic disease. Problem #4 history of gout no recent activation Problem #5 metabolic bone disease her calcium and phosphorus is was in acceptable range. Problem #6 hypokalemia potassium has normalized. Problem #7 hypothyroidism. Plan: We'll add amlodipine 5 mg po once a day           Kcl 40 meq po one dose           We'll check her basic metabolic panel in the morning.  D/C ivf            Nuiron 150 mg po once a day   LOS: 3 days   Emilly Lavey S 06/11/2013,8:01 AM

## 2013-06-11 NOTE — Progress Notes (Signed)
cc'd to pcp 

## 2013-06-11 NOTE — Progress Notes (Signed)
UR chart review completed.  

## 2013-06-11 NOTE — Care Management Note (Addendum)
    Page 1 of 1   06/12/2013     4:01:30 PM   CARE MANAGEMENT NOTE 06/12/2013  Patient:  Sonya [name], Sonya [name]   Account Number:  [id number]  Date Initiated:  06/11/2013  Documentation initiated by:  Theophilus Kinds  Subjective/Objective Assessment:   Pt admitted from home with renal failure and HTN. Pt lives with her husband and will return home at discharge. Pt is independent with ADL's. Pt has PCP but no insurance. Pt has family that helps with medications.     Action/Plan:   Pt may need MATCH voucher at discharge. Will continue to follow for any discharge planning needs.   Anticipated DC Date:  06/12/2013   Anticipated DC Plan:  [place]  CM consult      Choice offered to / List presented to:             Status of service:  Completed, signed off Medicare Important Message given?   (If response is "NO", the following Medicare IM given date fields will be blank) Date Medicare IM given:   Date Additional Medicare IM given:    Discharge Disposition:  HOME/SELF CARE  Per UR Regulation:    If discussed at Long Length of Stay Meetings, dates discussed:    Comments:  06/12/13 [place], RN BSN CM Pt discharged home today. [place] voucher given for medication assistance and pt verbalized understanding. No other CM needs noted.  06/11/13 [place], RN BSN CM

## 2013-06-11 NOTE — Progress Notes (Signed)
[facility] PROGRESS NOTE  Sonya [name] [id number] DOB: Dec 26, 1950 DOA: 06/08/2013 PCP: [name], MD  Assessment/Plan: 1. Anemia of chronic disease.  S/p 2 units of prbc.  No obvious signs of bleeding. Ferritin is high and TIBC is low. Continue to follow. 2. Acute on chronic kidney disease.  Creatinine was 5.3 on admission. Possibly related to ATN from hypertension, NSAID use. Renal function appears to be plateauing.  Nephrology input is appreciated. She will likely need outpatient follow up with nephrology to track further improvement/progression. 3. Uncontrolled hypertension, symptomatic.  On hydralazine and labetalol.  Blood pressure still uncontrolled. Norvasc added by nephrology. 4. Vomiting and dysphagia .  Patient reports some improvement today. Likely due to esophageal stricture that was dilated. Able to tolerate oral meds.   5. Hypokalemia. Improved. 6. Gout stable  Code Status: full code Family Communication: discussed with patient  Disposition Plan: discharge home once improved, likely in am.   Consultants: Nephrology  Procedures: EGD on 11/7: 1. Stricture at the gastroesophageal junction  2. MILD NON-erosive gastritis (inflammation) was found in the  gastric antrum; multiple biopsies  3. NO SOURCE FOR ANEMIA IDETIFIED-MOST LIKELY DUE TO ANEMIA OF  CHRONIC DISEASE    Antibiotics:  none  HPI/Subjective: Having intermittent headache today, relating that to blood pressure  Objective: Filed Vitals:   06/11/13 1458  BP: 164/89  Pulse: 87  Temp: 98.5 F (36.9 C)  Resp: 20    Intake/Output Summary (Last 24 hours) at 06/11/13 1837 Last data filed at 06/11/13 1720  Gross per 24 hour  Intake   3360 ml  Output      0 ml  Net   3360 ml   Filed Weights   06/08/13 1500 06/09/13 0500 06/10/13 0600  Weight: 82.3 kg (181 lb 7 oz) 86.7 kg (191 lb 2.2 oz) 86.6 kg (190 lb 14.7 oz)    Exam:   General:  NAD  Cardiovascular: S1, S2  RRR  Respiratory: CTA B  Abdomen: Soft, nt, nd, bs+  Musculoskeletal: no edema b/l   Data Reviewed: Basic Metabolic Panel:  Recent Labs Lab 06/07/13 1102 06/08/13 1813 06/09/13 0555 06/09/13 1052 06/10/13 0541 06/11/13 0505  NA 141 139 139  --  136 140  K 3.7 3.2* 3.6  --  3.5 3.3*  CL 108 105 108  --  106 111  CO2 21 24 20   --  19 18*  GLUCOSE 104* 96 104*  --  97 90  BUN 33* 39* 37*  --  30* 29*  CREATININE 4.40* 5.31* 4.84*  --  4.24* 4.27*  CALCIUM 9.4 9.3 9.2 8.6 9.2 8.6  PHOS  --   --   --   --  3.7  --    Liver Function Tests:  Recent Labs Lab 06/07/13 1102  AST 13  ALT <8  ALKPHOS 53  BILITOT 0.3  PROT 6.4  ALBUMIN 3.5   No results found for this basename: LIPASE, AMYLASE,  in the last 168 hours No results found for this basename: AMMONIA,  in the last 168 hours CBC:  Recent Labs Lab 06/07/13 1311 06/08/13 1813 06/09/13 0555 06/10/13 0541  WBC 6.4 5.4 7.2 6.7  NEUTROABS 3.8  --   --   --   HGB 6.9* 6.1* 8.7* 9.0*  HCT 21.9* 19.6* 27.0* 27.7*  MCV 71.8* 73.7* 77.1* 76.7*  PLT 279 181 209 PLATELET CLUMPS NOTED ON SMEAR, COUNT APPEARS ADEQUATE   Cardiac Enzymes: No results found for this basename: CKTOTAL,  CKMB, CKMBINDEX, TROPONINI,  in the last 168 hours BNP (last 3 results) No results found for this basename: PROBNP,  in the last 8760 hours CBG: No results found for this basename: GLUCAP,  in the last 168 hours  No results found for this or any previous visit (from the past 240 hour(s)).   Studies: No results found.  Scheduled Meds: . amLODipine  5 mg Oral Daily  . docusate sodium  100 mg Oral BID  . hydrALAZINE  50 mg Oral Q8H  . iron polysaccharides  150 mg Oral Daily  . labetalol  300 mg Oral BID  . levothyroxine  50 mcg Oral QAC breakfast  . pantoprazole  40 mg Oral QAC supper  . sodium chloride  3 mL Intravenous Q12H   Continuous Infusions:    Active Problems:   HTN (hypertension)   Anemia in chronic kidney disease    Acute on chronic kidney disease, stage 3   Regurgitation   Hypokalemia   Gout    Time spent: 66mins    Sonya [name]  [facility] Pager [phone]. If 7PM-7AM, please contact night-coverage at [email or web address], password [facility] 06/11/2013, 6:37 PM  LOS: 3 days

## 2013-06-11 NOTE — Progress Notes (Signed)
Patient B/P 194/99,Hr 88,Dr Memon notified. Will continue to monitor patient.

## 2013-06-12 LAB — BASIC METABOLIC PANEL
BUN: 28 mg/dL — ABNORMAL HIGH (ref 6–23)
Calcium: 8.8 mg/dL (ref 8.4–10.5)
Chloride: 110 mEq/L (ref 96–112)
Creatinine, Ser: 4.51 mg/dL — ABNORMAL HIGH (ref 0.50–1.10)
GFR calc Af Amer: 11 mL/min — ABNORMAL LOW (ref >90)
GFR calc non Af Amer: 9 mL/min — ABNORMAL LOW (ref >90)
Glucose, Bld: 88 mg/dL (ref 70–99)

## 2013-06-12 MED ORDER — AMLODIPINE BESYLATE 5 MG PO TABS
5.0000 mg | ORAL_TABLET | Freq: Once | ORAL | Status: AC
  Administered 2013-06-12: 5 mg via ORAL

## 2013-06-12 MED ORDER — HYDRALAZINE HCL 50 MG PO TABS: 50.0000 mg | ORAL_TABLET | Freq: Three times a day (TID) | ORAL | Status: DC

## 2013-06-12 MED ORDER — AMLODIPINE BESYLATE 10 MG PO TABS: 10.0000 mg | ORAL_TABLET | Freq: Every day | ORAL | Status: DC

## 2013-06-12 MED ORDER — LABETALOL HCL 300 MG PO TABS: 300.0000 mg | ORAL_TABLET | Freq: Two times a day (BID) | ORAL | Status: DC

## 2013-06-12 MED ORDER — SODIUM BICARBONATE 650 MG PO TABS: 650.0000 mg | ORAL_TABLET | Freq: Two times a day (BID) | ORAL | Status: DC

## 2013-06-12 MED ORDER — AMLODIPINE BESYLATE 5 MG PO TABS
10.0000 mg | ORAL_TABLET | Freq: Every day | ORAL | Status: DC
  Filled 2013-06-12: qty 2

## 2013-06-12 NOTE — Discharge Summary (Signed)
Physician Discharge Summary  [name] [id number] DOB: 13-Nov-1950 DOA: 06/08/2013  PCP: [name], MD  Admit date: 06/08/2013 Discharge date: 06/12/2013  Time spent: 40 minutes  Recommendations for Outpatient Follow-up:  1. Follow up with Dr. Lowanda Foster in 4 weeks 2. Follow up with primary care doctor in 2 weeks  Discharge Diagnoses:  Active Problems:   HTN (hypertension)   Anemia in chronic kidney disease   Acute on chronic kidney disease, stage 3   Esophageal stricture s/p dilatation   Hypokalemia   Gout   Discharge Condition: improved  Diet recommendation: low salt  Filed Weights   06/09/13 0500 06/10/13 0600 06/12/13 0532  Weight: 86.7 kg (191 lb 2.2 oz) 86.6 kg (190 lb 14.7 oz) 88.089 kg (194 lb 3.2 oz)    History of present illness:  Sonya [name] is a 62 y.o. female who presented to the hospital today to undergo endoscopy for further evaluation of her persistent vomiting and difficulty swallowing. She was seen by Dr. Oneida Alar in the office yesterday and was noted to be severely hypertensive with a blood pressure of 224/108. She was advised to present to the emergency room for evaluation, patient declined. She was given clonidine in the office with improvement of her blood pressure was sent home. Preop lab work was drawn which revealed a hemoglobin 6.9 as well as a creatinine of 4.4. The patient has a history of chronic anemia with baseline hemoglobin of 8 and 9. She's also had chronic kidney disease with prior creatinine of 1.9 in 2010. The patient reports that she was previously taking blood pressure medications which included hydrochlorothiazide, triamterene, bisoprolol. These medications are being adjusted, but she reported many of these medications are Effexor her kidneys and therefore were being interchanged. She reports experiencing dysphasia and regurgitation with most foods for the past several weeks and has been unable to tolerate any medications. She's  not taken any blood pressure medications and that time. She's also noticed for the past several weeks she has become increasingly weak, lightheaded, short of breath on exertion. She's not noticed any frank bleeding. She's not had any hematemesis, melena or hematochezia. Her bowel movements have been normal. She's not had any hematuria, dysuria, cough, fever, chest pain. Due to her abnormal blood work and elevated blood pressures, the patient was referred as a direct admission to the hospitalist service.   Hospital Course:  This patient was admitted to the hospital for abnormal lab work as well as severe hypertension. She was undergoing EGD for esophageal stricture and dilatation. She was found to be markedly hypertensive. Preop blood work indicated a significant anemia as well as a elevated creatinine. She was admitted as a direct admission from the endoscopy unit. She was transfused 2 units of PRBCs her hemoglobin improved. Stools have been negative for occult blood. No further workup from GI tract upper GI. It is felt that her anemia is likely of chronic disease from her kidney disease. She was started on iron supplements. Regarding her renal failure, she was followed by nephrology. She did have an initial improvement in her renal function. This was felt to be an acute on chronic renal failure likely caused by long-standing uncontrolled hypertension. She was also taking NSAID for gout. Her creatinine remains elevated ranging between 4.2 and 4.5. She's been clear for discharge from nephrology for outpatient followup in the next 3-4 weeks. She does have a mild metabolic acidosis which is felt to be due to renal failure and has been started  on oral bicarbonate. Regarding her hypertension, she was started on Norvasc, hydralazine, labetalol. This be further titrated by her primary care physician/nephrologist as an outpatient. The patient is stable for discharge home  per  Procedures:  none  Consultations:  Nephrology  Discharge Exam: Filed Vitals:   06/12/13 1257  BP: 167/86  Pulse: 82  Temp: 97.9 F (36.6 C)  Resp: 19    General: NAD Cardiovascular: S1, S2 RRR Respiratory: CTA B  Discharge Instructions  Discharge Orders   Future Orders Complete By Expires   Call MD for:  difficulty breathing, headache or visual disturbances  As directed    Call MD for:  persistant nausea and vomiting  As directed    Diet - low sodium heart healthy  As directed    Increase activity slowly  As directed        Medication List    STOP taking these medications       bisoprolol-hydrochlorothiazide 2.5-6.25 MG per tablet  Commonly known as:  ZIAC     cloNIDine 0.1 MG tablet  Commonly known as:  CATAPRES     furosemide 20 MG tablet  Commonly known as:  LASIX     ibuprofen 200 MG tablet  Commonly known as:  ADVIL,MOTRIN     indomethacin 50 MG capsule  Commonly known as:  INDOCIN     potassium chloride SA 20 MEQ tablet  Commonly known as:  K-DUR,KLOR-CON     triamterene-hydrochlorothiazide 37.5-25 MG per capsule  Commonly known as:  DYAZIDE     verapamil 120 MG tablet  Commonly known as:  CALAN      TAKE these medications       allopurinol 100 MG tablet  Commonly known as:  ZYLOPRIM  Take 100 mg by mouth daily.     amLODipine 10 MG tablet  Commonly known as:  NORVASC  Take 1 tablet (10 mg total) by mouth daily.  Start taking on:  06/13/2013     aspirin 81 MG tablet  Take 81 mg by mouth daily.     docusate sodium 100 MG capsule  Commonly known as:  COLACE  Take 100 mg by mouth 2 (two) times daily.     ferrous sulfate dried 160 (50 FE) MG Tbcr SR tablet  Commonly known as:  SLOW FE  Take 160 mg by mouth daily.     hydrALAZINE 50 MG tablet  Commonly known as:  APRESOLINE  Take 1 tablet (50 mg total) by mouth every 8 (eight) hours.     labetalol 300 MG tablet  Commonly known as:  NORMODYNE  Take 1 tablet (300 mg  total) by mouth 2 (two) times daily.     levothyroxine 50 MCG tablet  Commonly known as:  SYNTHROID, LEVOTHROID  Take 50 mcg by mouth daily.     MECLIZINE HCL PO  Take 20 mg by mouth as needed.     omeprazole 20 MG capsule  Commonly known as:  PRILOSEC  1 po every morning 30 minutes prior to your first meal.     sodium bicarbonate 650 MG tablet  Take 1 tablet (650 mg total) by mouth 2 (two) times daily.       Allergies  Allergen Reactions  . Cephalexin        Follow-up Information   Follow up with [facility] S, MD In 4 weeks.   Specialty:  Nephrology   Contact information:   [address] [place] [address] [phone]  Follow up with [name], MD. Schedule an appointment as soon as possible for a visit in 2 weeks.   Specialty:  Internal Medicine   Contact information:   [id number] [place] [address] [phone]        The results of significant diagnostics from this hospitalization (including imaging, microbiology, ancillary and laboratory) are listed below for reference.    Significant Diagnostic Studies: Dg Chest 2 View  06/07/2013   CLINICAL DATA:  Dysphagia  EXAM: CHEST  2 VIEW  COMPARISON:  05/08/2010  FINDINGS: Cardiac silhouette is normal in size. The aorta is mildly uncoiled and tortuous. No mediastinal or hilar masses. Clear lungs. No pleural effusion or pneumothorax.  The bony thorax is intact.  IMPRESSION: No active cardiopulmonary disease.   Electronically Signed   By: Lajean Manes M.D.   On: 06/07/2013 13:56   US Renal  06/09/2013   CLINICAL DATA:  Acute on chronic renal failure  EXAM: RENAL/URINARY TRACT ULTRASOUND COMPLETE  COMPARISON:  None.  FINDINGS: Right Kidney  Length: 10.4 cm. The renal cortex is increased in echogenicity. There are to anechoic areas within the upper pole measuring 9 x 8 x 9 mm and 1.3 x 1.1 x 1.4 cm with increased through transmission most consistent with tiny  cysts.  Left Kidney  Length: 12.9 cm. The renal cortex is increased in echogenicity. There are two anechoic areas within the left kidney. There is an anechoic area in the upper pole measuring 1.8 x 1.5 x 1.7 cm most consistent with a cyst. There is a 1.2 x 1.2 x 1.2 cm anechoic area in the left midpole likely reflecting a cyst.  Bladder  Appears normal for degree of bladder distention.  IMPRESSION: 1.  No obstructive uropathy.  2. Increased renal cortical echogenicity bilaterally as can be seen with medical renal disease.  3.  Small bilateral renal cysts.   Electronically Signed   By: Kathreen Devoid   On: 06/09/2013 09:59    Microbiology: No results found for this or any previous visit (from the past 240 hour(s)).   Labs: Basic Metabolic Panel:  Recent Labs Lab 06/08/13 1813 06/09/13 0555 06/09/13 1052 06/10/13 0541 06/11/13 0505 06/12/13 0453  NA 139 139  --  136 140 138  K 3.2* 3.6  --  3.5 3.3* 3.7  CL 105 108  --  106 111 110  CO2 24 20  --  19 18* 18*  GLUCOSE 96 104*  --  97 90 88  BUN 39* 37*  --  30* 29* 28*  CREATININE 5.31* 4.84*  --  4.24* 4.27* 4.51*  CALCIUM 9.3 9.2 8.6 9.2 8.6 8.8  PHOS  --   --   --  3.7  --   --    Liver Function Tests:  Recent Labs Lab 06/07/13 1102  AST 13  ALT <8  ALKPHOS 53  BILITOT 0.3  PROT 6.4  ALBUMIN 3.5   No results found for this basename: LIPASE, AMYLASE,  in the last 168 hours No results found for this basename: AMMONIA,  in the last 168 hours CBC:  Recent Labs Lab 06/07/13 1311 06/08/13 1813 06/09/13 0555 06/10/13 0541  WBC 6.4 5.4 7.2 6.7  NEUTROABS 3.8  --   --   --   HGB 6.9* 6.1* 8.7* 9.0*  HCT 21.9* 19.6* 27.0* 27.7*  MCV 71.8* 73.7* 77.1* 76.7*  PLT 279 181 209 PLATELET CLUMPS NOTED ON SMEAR, COUNT APPEARS ADEQUATE   Cardiac  Enzymes: No results found for this basename: CKTOTAL, CKMB, CKMBINDEX, TROPONINI,  in the last 168 hours BNP: BNP (last 3 results) No results found for this basename: PROBNP,  in the  last 8760 hours CBG: No results found for this basename: GLUCAP,  in the last 168 hours     Signed:  MEMON,JEHANZEB  [facility] 06/12/2013, 3:23 PM

## 2013-06-12 NOTE — Progress Notes (Signed)
Patient given discharge instructions on medications,diet,and follow up visits,patient verbalized understanding. Prescription sent with patient.No c/o pain or discomfort noted.Accompanied by staff to an awaiting vehicle.

## 2013-06-12 NOTE — Progress Notes (Signed)
Subjective: Interval History:  Presently patient offers no complaints . She denies any difficulty in breathing.Feeling better  Objective: Vital signs in last 24 hours: Temp:  [98.2 F (36.8 C)-98.9 F (37.2 C)] 98.5 F (36.9 C) (11/11 0532) Pulse Rate:  [83-94] 94 (11/11 0532) Resp:  [18-20] 20 (11/11 0532) BP: (164-194)/(83-100) 181/85 mmHg (11/11 0532) SpO2:  [96 %-100 %] 99 % (11/11 0532) Weight:  [88.089 kg (194 lb 3.2 oz)] 88.089 kg (194 lb 3.2 oz) (11/11 0532) Weight change:   Intake/Output from previous day: 11/10 0701 - 11/11 0700 In: 1701.7 [P.O.:360; I.V.:1341.7] Out: 300 [Urine:300] Intake/Output this shift: Total I/O In: -  Out: 500 [Urine:500]  General appearance: alert, cooperative and no distress Resp: clear to auscultation bilaterally Cardio: regular rate and rhythm, S1, S2 normal, no murmur, click, rub or gallop GI: soft, non-tender; bowel sounds normal; no masses,  no organomegaly Extremities: extremities normal, atraumatic, no cyanosis or edema  Lab Results:  Recent Labs  06/10/13 0541  WBC 6.7  HGB 9.0*  HCT 27.7*  PLT PLATELET CLUMPS NOTED ON SMEAR, COUNT APPEARS ADEQUATE   BMET:   Recent Labs  06/11/13 0505 06/12/13 0453  NA 140 138  K 3.3* 3.7  CL 111 110  CO2 18* 18*  GLUCOSE 90 88  BUN 29* 28*  CREATININE 4.27* 4.51*  CALCIUM 8.6 8.8    Recent Labs  06/09/13 1052  PTH 473.2*   Iron Studies:  No results found for this basename: IRON, TIBC, TRANSFERRIN, FERRITIN,  in the last 72 hours  Studies/Results: No results found.  I have reviewed the patient's current medications.  Assessment/Plan: Problem #1 renal failure acute on chronic. Her BUN and creatinine is presently 28 and 4.51 respectively renal function is slightly increasing .Patient does not have uremic sign and symptoms. 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  Problem #2 hypertension her blood pressure is still high but  improving Problem #3 anemia. Her iron saturation is low but ferritin is high possibly combination of iron deficiency and anemia of chronic disease. Problem #4 history of gout no recent activation Problem #5 metabolic bone disease her calcium and phosphorus is was in acceptable range. Problem #6 hypokalemia potassium has normalized. Problem #7 hypothyroidism. Plan: We'll increase amlodipine 10 mg po once a day          I will follow patient when discharged  LOS: 4 days   Lennis Korb S 06/12/2013,7:57 AM

## 2013-06-13 ENCOUNTER — Encounter (HOSPITAL_COMMUNITY): Payer: Self-pay | Admitting: Gastroenterology

## 2013-06-20 ENCOUNTER — Encounter (HOSPITAL_COMMUNITY): Payer: Self-pay | Admitting: Oncology

## 2013-06-20 ENCOUNTER — Telehealth: Payer: Self-pay | Admitting: Gastroenterology

## 2013-06-20 ENCOUNTER — Observation Stay (HOSPITAL_COMMUNITY)
Admission: AD | Admit: 2013-06-20 | Discharge: 2013-06-22 | Disposition: A | Discharge status: Home / Self Care | Payer: Medicaid Other | Source: Ambulatory Visit | Attending: Internal Medicine | Admitting: Internal Medicine

## 2013-06-20 DIAGNOSIS — N183 Chronic kidney disease, stage 3 unspecified: Secondary | ICD-10-CM

## 2013-06-20 DIAGNOSIS — D631 Anemia in chronic kidney disease: Principal | ICD-10-CM

## 2013-06-20 DIAGNOSIS — I160 Hypertensive urgency: Secondary | ICD-10-CM

## 2013-06-20 DIAGNOSIS — I129 Hypertensive chronic kidney disease with stage 1 through stage 4 chronic kidney disease, or unspecified chronic kidney disease: Secondary | ICD-10-CM | POA: Insufficient documentation

## 2013-06-20 DIAGNOSIS — IMO0001 Reserved for inherently not codable concepts without codable children: Secondary | ICD-10-CM

## 2013-06-20 DIAGNOSIS — K5900 Constipation, unspecified: Secondary | ICD-10-CM | POA: Insufficient documentation

## 2013-06-20 DIAGNOSIS — D649 Anemia, unspecified: Secondary | ICD-10-CM | POA: Diagnosis present

## 2013-06-20 DIAGNOSIS — R42 Dizziness and giddiness: Secondary | ICD-10-CM

## 2013-06-20 DIAGNOSIS — I10 Essential (primary) hypertension: Secondary | ICD-10-CM

## 2013-06-20 DIAGNOSIS — M109 Gout, unspecified: Secondary | ICD-10-CM | POA: Diagnosis present

## 2013-06-20 DIAGNOSIS — N189 Chronic kidney disease, unspecified: Secondary | ICD-10-CM

## 2013-06-20 DIAGNOSIS — K2970 Gastritis, unspecified, without bleeding: Secondary | ICD-10-CM | POA: Insufficient documentation

## 2013-06-20 LAB — CBC WITH DIFFERENTIAL/PLATELET
Basophils Relative: 0 % (ref 0–1)
Eosinophils Absolute: 0.1 10*3/uL (ref 0.0–0.7)
Lymphs Abs: 1.3 10*3/uL (ref 0.7–4.0)
MCH: 24.2 pg — ABNORMAL LOW (ref 26.0–34.0)
MCHC: 31.2 g/dL (ref 30.0–36.0)
MCV: 77.7 fL — ABNORMAL LOW (ref 78.0–100.0)
Monocytes Absolute: 0.5 10*3/uL (ref 0.1–1.0)
Neutro Abs: 5.1 10*3/uL (ref 1.7–7.7)
Neutrophils Relative %: 72 % (ref 43–77)
Platelets: 251 10*3/uL (ref 150–400)

## 2013-06-20 LAB — URINE MICROSCOPIC-ADD ON

## 2013-06-20 LAB — RETICULOCYTES
RBC.: 3.26 MIL/uL — ABNORMAL LOW (ref 3.87–5.11)
Retic Count, Absolute: 32.6 10*3/uL (ref 19.0–186.0)
Retic Ct Pct: 1 % (ref 0.4–3.1)

## 2013-06-20 LAB — COMPREHENSIVE METABOLIC PANEL
ALT: 6 U/L (ref 0–35)
AST: 11 U/L (ref 0–37)
Albumin: 3.2 g/dL — ABNORMAL LOW (ref 3.5–5.2)
BUN: 37 mg/dL — ABNORMAL HIGH (ref 6–23)
CO2: 24 mEq/L (ref 19–32)
Chloride: 104 mEq/L (ref 96–112)
GFR calc non Af Amer: 9 mL/min — ABNORMAL LOW (ref >90)
Potassium: 3.8 mEq/L (ref 3.5–5.1)
Sodium: 139 mEq/L (ref 135–145)
Total Bilirubin: 0.2 mg/dL — ABNORMAL LOW (ref 0.3–1.2)
Total Protein: 7.3 g/dL (ref 6.0–8.3)

## 2013-06-20 LAB — MRSA PCR SCREENING: MRSA by PCR: NEGATIVE

## 2013-06-20 LAB — URINALYSIS, ROUTINE W REFLEX MICROSCOPIC
Glucose, UA: NEGATIVE mg/dL
Ketones, ur: NEGATIVE mg/dL
Leukocytes, UA: NEGATIVE
Protein, ur: >300 mg/dL — AB
Specific Gravity, Urine: 1.025 (ref 1.005–1.030)
pH: 6 (ref 5.0–8.0)

## 2013-06-20 LAB — PREPARE RBC (CROSSMATCH)

## 2013-06-20 LAB — IRON AND TIBC
Iron: 38 ug/dL — ABNORMAL LOW (ref 42–135)
TIBC: 148 ug/dL — ABNORMAL LOW (ref 250–470)
UIBC: 110 ug/dL — ABNORMAL LOW (ref 125–400)

## 2013-06-20 MED ORDER — HYDRALAZINE HCL 25 MG PO TABS
50.0000 mg | ORAL_TABLET | Freq: Three times a day (TID) | ORAL | Status: DC
  Administered 2013-06-20 – 2013-06-22 (×7): 50 mg via ORAL
  Filled 2013-06-20 (×7): qty 2

## 2013-06-20 MED ORDER — AMLODIPINE BESYLATE 5 MG PO TABS
10.0000 mg | ORAL_TABLET | Freq: Every day | ORAL | Status: DC
  Administered 2013-06-20: 10 mg via ORAL
  Filled 2013-06-20: qty 2

## 2013-06-20 MED ORDER — PANTOPRAZOLE SODIUM 40 MG PO TBEC
40.0000 mg | DELAYED_RELEASE_TABLET | Freq: Every day | ORAL | Status: DC
  Administered 2013-06-20 – 2013-06-22 (×3): 40 mg via ORAL
  Filled 2013-06-20 (×3): qty 1

## 2013-06-20 MED ORDER — LEVOTHYROXINE SODIUM 50 MCG PO TABS
50.0000 ug | ORAL_TABLET | Freq: Every day | ORAL | Status: DC
  Administered 2013-06-21 – 2013-06-22 (×2): 50 ug via ORAL
  Filled 2013-06-20 (×3): qty 1

## 2013-06-20 MED ORDER — SODIUM BICARBONATE 650 MG PO TABS
650.0000 mg | ORAL_TABLET | Freq: Two times a day (BID) | ORAL | Status: DC
  Administered 2013-06-20 – 2013-06-22 (×5): 650 mg via ORAL
  Filled 2013-06-20 (×5): qty 1

## 2013-06-20 MED ORDER — SODIUM CHLORIDE 0.9 % IV SOLN
INTRAVENOUS | Status: DC
  Administered 2013-06-20: 16:00:00 via INTRAVENOUS

## 2013-06-20 MED ORDER — ONDANSETRON HCL 4 MG PO TABS: 4.0000 mg | ORAL_TABLET | Freq: Four times a day (QID) | ORAL | Status: DC | PRN

## 2013-06-20 MED ORDER — SODIUM CHLORIDE 0.9 % IJ SOLN
3.0000 mL | Freq: Two times a day (BID) | INTRAMUSCULAR | Status: DC
  Administered 2013-06-22: 3 mL via INTRAVENOUS

## 2013-06-20 MED ORDER — FERROUS SULFATE 325 (65 FE) MG PO TABS
325.0000 mg | ORAL_TABLET | Freq: Every day | ORAL | Status: DC
  Administered 2013-06-21 – 2013-06-22 (×2): 325 mg via ORAL
  Filled 2013-06-20 (×2): qty 1

## 2013-06-20 MED ORDER — HYDROCODONE-ACETAMINOPHEN 5-325 MG PO TABS: 1.0000 | ORAL_TABLET | ORAL | Status: DC | PRN

## 2013-06-20 MED ORDER — LABETALOL HCL 200 MG PO TABS
300.0000 mg | ORAL_TABLET | Freq: Three times a day (TID) | ORAL | Status: DC
  Administered 2013-06-20 – 2013-06-22 (×6): 300 mg via ORAL
  Filled 2013-06-20 (×6): qty 2

## 2013-06-20 MED ORDER — DARBEPOETIN ALFA-POLYSORBATE 40 MCG/0.4ML IJ SOLN
25.0000 ug | INTRAMUSCULAR | Status: DC
Start: 2013-06-20 — End: 2013-06-21
  Administered 2013-06-20: 25 ug via SUBCUTANEOUS
  Filled 2013-06-20: qty 0.42

## 2013-06-20 MED ORDER — NIFEDIPINE ER OSMOTIC RELEASE 30 MG PO TB24
90.0000 mg | ORAL_TABLET | Freq: Every day | ORAL | Status: DC
  Administered 2013-06-21 – 2013-06-22 (×2): 90 mg via ORAL
  Filled 2013-06-20 (×2): qty 3

## 2013-06-20 MED ORDER — ALUM & MAG HYDROXIDE-SIMETH 200-200-20 MG/5ML PO SUSP: 30.0000 mL | Freq: Four times a day (QID) | ORAL | Status: DC | PRN

## 2013-06-20 MED ORDER — ACETAMINOPHEN 650 MG RE SUPP: 650.0000 mg | Freq: Four times a day (QID) | RECTAL | Status: DC | PRN

## 2013-06-20 MED ORDER — FLEET ENEMA 7-19 GM/118ML RE ENEM: 1.0000 | ENEMA | Freq: Once | RECTAL | Status: AC | PRN

## 2013-06-20 MED ORDER — ONDANSETRON HCL 4 MG/2ML IJ SOLN
4.0000 mg | Freq: Four times a day (QID) | INTRAMUSCULAR | Status: DC | PRN
  Administered 2013-06-21: 4 mg via INTRAVENOUS
  Filled 2013-06-20: qty 2

## 2013-06-20 MED ORDER — BISACODYL 10 MG RE SUPP: 10.0000 mg | Freq: Every day | RECTAL | Status: DC | PRN

## 2013-06-20 MED ORDER — DOCUSATE SODIUM 100 MG PO CAPS: 100.0000 mg | ORAL_CAPSULE | Freq: Two times a day (BID) | ORAL | Status: DC

## 2013-06-20 MED ORDER — ACETAMINOPHEN 325 MG PO TABS: 650.0000 mg | ORAL_TABLET | Freq: Four times a day (QID) | ORAL | Status: DC | PRN

## 2013-06-20 MED ORDER — ALLOPURINOL 100 MG PO TABS
100.0000 mg | ORAL_TABLET | Freq: Every day | ORAL | Status: DC
  Administered 2013-06-20 – 2013-06-22 (×3): 100 mg via ORAL
  Filled 2013-06-20 (×3): qty 1

## 2013-06-20 MED ORDER — DOCUSATE SODIUM 100 MG PO CAPS
100.0000 mg | ORAL_CAPSULE | Freq: Two times a day (BID) | ORAL | Status: DC
  Administered 2013-06-20 – 2013-06-22 (×5): 100 mg via ORAL
  Filled 2013-06-20 (×5): qty 1

## 2013-06-20 MED ORDER — LABETALOL HCL 200 MG PO TABS
300.0000 mg | ORAL_TABLET | Freq: Two times a day (BID) | ORAL | Status: DC
Start: 2013-06-20 — End: 2013-06-20

## 2013-06-20 MED ORDER — ENOXAPARIN SODIUM 40 MG/0.4ML ~~LOC~~ SOLN
40.0000 mg | SUBCUTANEOUS | Status: DC
  Administered 2013-06-20: 40 mg via SUBCUTANEOUS
  Filled 2013-06-20 (×2): qty 0.4

## 2013-06-20 MED ORDER — CALCITRIOL 0.25 MCG PO CAPS
0.2500 ug | ORAL_CAPSULE | Freq: Every day | ORAL | Status: DC
  Administered 2013-06-20 – 2013-06-22 (×3): 0.25 ug via ORAL
  Filled 2013-06-20 (×3): qty 1

## 2013-06-20 NOTE — Consult Note (Signed)
Sonya [name] MRN: [id number] DOB/AGE: [birth year] 62 y.o. Primary Care Physician:FUSCO,LAWRENCE J., MD Admit date: 06/20/2013 Chief Complaint: No chief complaint on file.  HPI:  Pt is 62 year old female with past medical hx of HTN, anemia who came to Er as per direction from her pcp.  HPI dates back to 2 week ago when pt came to GI for procedure and was diagnosed with sever a anem ia and AKI.Ptw as sent to Er and asdmitted.pt at that time has her Bp meds adjusted and d.ced home. Pt on follow up with pcp was noticed to have severe anemia and was asked to go to ER. Pt offers no c/o chest pain. No c./o fever/cough/chills.  No c/o syncope but had dizziness.. No c/o hematuria. C/o feeling cold  All the time  Pt says she continue to take NSAIDS  Past Medical History  Diagnosis Date  . HTN (hypertension)   . Hypothyroidism   . Uterine cancer 2012        Family History  Problem Relation Age of Onset  . Colon cancer Neg Hx    Strong hx of ESRD-sister on HD  Social History:  reports that she has never smoked. She does not have any smokeless tobacco history on file. She reports that she does not drink alcohol or use illicit drugs.   Allergies:  Allergies  Allergen Reactions  . Cephalexin     Medications Prior to Admission  Medication Sig Dispense Refill  . allopurinol (ZYLOPRIM) 100 MG tablet Take 100 mg by mouth daily.      Marland Kitchen amLODipine (NORVASC) 10 MG tablet Take 1 tablet (10 mg total) by mouth daily.  30 tablet  1  . aspirin 81 MG tablet Take 81 mg by mouth daily.        Marland Kitchen docusate sodium (COLACE) 100 MG capsule Take 100 mg by mouth 2 (two) times daily.      . ferrous sulfate dried (SLOW FE) 160 (50 FE) MG TBCR Take 160 mg by mouth daily.        . hydrALAZINE (APRESOLINE) 50 MG tablet Take 1 tablet (50 mg total) by mouth every 8 (eight) hours.  90 tablet  1  . labetalol (NORMODYNE) 300 MG tablet Take 1 tablet (300 mg total) by mouth 2 (two) times daily.  60 tablet  1  .  levothyroxine (SYNTHROID, LEVOTHROID) 50 MCG tablet Take 50 mcg by mouth daily.        Marland Kitchen MECLIZINE HCL PO Take 20 mg by mouth as needed.       Marland Kitchen omeprazole (PRILOSEC) 20 MG capsule 1 po every morning 30 minutes prior to your first meal.  31 capsule  11  . sodium bicarbonate 650 MG tablet Take 1 tablet (650 mg total) by mouth 2 (two) times daily.  60 tablet  1       [id number] from the symptoms mentioned above,there are no other symptoms referable to all systems reviewed.  Marland Kitchen allopurinol  100 mg Oral Daily  . amLODipine  10 mg Oral Daily  . docusate sodium  100 mg Oral BID  . docusate sodium  100 mg Oral BID  . enoxaparin (LOVENOX) injection  40 mg Subcutaneous Q24H  . [START ON 06/21/2013] ferrous sulfate  325 mg Oral Q breakfast  . hydrALAZINE  50 mg Oral Q8H  . labetalol  300 mg Oral BID  . levothyroxine  50 mcg Oral Daily  . pantoprazole  40 mg Oral Daily  . sodium bicarbonate  650 mg Oral BID  . sodium chloride  3 mL Intravenous Q12H        Physical Exam: Vital signs in last 24 hours: Temp:  [97.7 F (36.5 C)] 97.7 F (36.5 C) (11/19 1429) Pulse Rate:  [91] 91 (11/19 1429) Resp:  [20] 20 (11/19 1429) BP: (203)/(93) 203/93 mmHg (11/19 1429) SpO2:  [100 %] 100 % (11/19 1429) Weight change:      Physical Exam: General- pt is awake,alert, oriented to time place and person Resp- No acute REsp distress, CTA B/L NO Rhonchi CVS- S1S2 regular in rate and rhythm GIT- BS+, soft, NT, ND EXT- NO LE Edema, Cyanosis CNS- CN 2-12 grossly intact. Moving all 4 extremities Psych- flat  mood and affect    Lab Results:  Pending from today CBC Results for MANEKA, BALTZELL (MRN [id number]) as of 06/20/2013 14:58  Ref. Range 06/10/2013 05:41  WBC Latest Range: 4.0-10.5 K/uL 6.7  RBC Latest Range: 3.87-5.11 MIL/uL 3.61 (L)  Hemoglobin Latest Range: 12.0-15.0 g/dL 9.0 (L)    Results for ALAIJHA, DILE (MRN [id number]) as of 06/20/2013 14:58  Ref. Range 06/08/2013 18:13  Iron  Latest Range: 42-135 ug/dL 22 (L)  UIBC Latest Range: 125-400 ug/dL 123 (L)  TIBC Latest Range: 250-470 ug/dL 145 (L)  Saturation Ratios Latest Range: 20-55 % 15 (L)  Ferritin Latest Range: 10-291 ng/mL 436 (H)  Folate No range found 5.7   HGbTrend 2014  6.9==>8.7==>9.0 2011  7.96 2010  12.3     BMET 06/12/13 Results for COLLIE, LAPIANA (MRN [id number]) as of 06/20/2013 14:58  Ref. Range 06/12/2013 04:53  Sodium Latest Range: 135-145 mEq/L 138  Potassium Latest Range: 3.5-5.1 mEq/L 3.7  Chloride Latest Range: 96-112 mEq/L 110  CO2 Latest Range: 19-32 mEq/L 18 (L)  BUN Latest Range: 6-23 mg/dL 28 (H)  Creatinine Latest Range: 0.50-1.10 mg/dL 4.51 (H)  Calcium Latest Range: 8.4-10.5 mg/dL 8.8  GFR calc non Af Amer Latest Range: >90 mL/min 9 (L)  GFR calc Af Amer Latest Range: >90 mL/min 11 (L)  Glucose Latest Range: 70-99 mg/dL 88   Creat trend 2014  4.4==>5.3==>4.5 2011  1.98 2010   1.46   Lab Results  Component Value Date   PTH 473.2* 06/09/2013   CALCIUM 8.8 06/12/2013   PHOS 3.7 06/10/2013    Renal U/s Right Kidney  Length: 10.4 cm. The renal cortex is increased in echogenicity.  There are to anechoic areas within the upper pole measuring 9 x 8 x  9 mm and 1.3 x 1.1 x 1.4 cm with increased through transmission most  consistent with tiny cysts.  Left Kidney  Length: 12.9 cm. The renal cortex is increased in echogenicity.  There are two anechoic areas within the left kidney. There is an  anechoic area in the upper pole measuring 1.8 x 1.5 x 1.7 cm most  consistent with a cyst. There is a 1.2 x 1.2 x 1.2 cm anechoic area  in the left midpole likely reflecting a cyst.   Impression: 1)Renal  AKI secondary to ATN ( pt taking NSAIDS)                AKI on CKD vs CKD progression                             CKD stage  4.               CKD since 2010 (  Most likely before that)               CKD secondary to HTN                Progression of CKD Marked with AKI                 Proteinura will check.  2)HTN  BP not at goal Target Organ damage  CKD Medication- On Calcium Channel Blockers On Alpha and beta Blockers On Vasodilators-   3)Anemia HGb at goal (9--11)   4)CKD Mineral-Bone Disorder PTH elevated. Secondary Hyperparathyroidism  present . Phosphorus at goal.   5)GI- hx of esophageal stricture S/p dilatation  6)FEN  Normokalemic NOrmonatremic  7)Acid base Co2 NOT at goal On PO bicarb as outpt     Plan:  Will ask for bmet wil ask for cbc Will ask for vitamin d Will start calcitriol Will suggest to start Nifedipine CC 90mg  po daily D/c amlodpine Increase labetalol to 300mg  po TID Continue sodium bicarb for acidosis  Will suggest to check iron stores again Will start epo in anticipation Discussed pt GFr and need for Hd soon Discussed with pt tp stay away from NSAIDS       Maebelle Sulton S 06/20/2013, 2:58 PM

## 2013-06-20 NOTE — H&P (Signed)
Patient seen and examined, above note reviewed.  Patient is known to me from last admission.  She is readmitted with dizziness/lightheadedness while walking.  This may be related to her acute on chronic anemia, which is likely related to her renal disease.  She does not have any evidence of bleeding.  She will be transfused 1 unit of prbc and has been started on epogen by nephrology. Her lightheadedness may also be related to fluctuations in her blood pressure. She was noted to be hypertensive on admission with a systolic blood pressure [id number].  Her labetalol has also been increased and amlodipine has been changed to nifedipine per nephrology recommendations. Her renal function is abnormal, but appears to be near her labs prior to discharge. She is likely headed towards dialysis in the future.  Will ask physical therapy to evaluate patient as well.  Anticipate discharge home in the next 24-48 hours.  MEMON,JEHANZEB

## 2013-06-20 NOTE — H&P (Signed)
[facility] History and Physical  Sonya [name] [id number] DOB: May 27, 1951 DOA: 06/20/2013  Referring physician:  PCP: Sonya [name]., MD  Specialists:   Chief Complaint: abnormal blood work  HPI: Sonya [name] is a very pleasant 62 y.o. female with a past medical history that includes uncontrolled hypertension, chronic kidney disease stage III, anemia and chronic kidney disease, gout, GERD, reports to the emergency room from her home under the instructions of Dr. Gerarda [name] her primary care provider do to hemoglobin of 7.8. In formation is obtained from the patient. She indicated that she was released from the hospital 7 days ago and saw her primary care provider yesterday for her post hospital followup. During her hospitalization she was found to be severely hypertensive and was started on several medications. She indicates that according to the blood pressure she had yesterday at her primary care provider's office there's been little improvement in blood pressure control. He states that yesterday when she saw her primary care provider adjustments were made to her medications again and lab work was drawn. It was today that her primary care provider called her and recommended that she come to the emergency room due to a drop in her hemoglobin. She reports that she has been dizzy since she left the hospital. She denies any worsening of that dizziness. She states that when she is sitting or lying down she does not feel dizzy. It is only when she gets up and walks that she feels dizzy. She indicates that the dizziness lasts for as long as she is walking. She denies any room spinning sensation chest pain palpitation shortness of breath headache syncope or near-syncope to Associated symptoms include weakness, anorexia, nausea with one episode of emesis 5 days ago. She denies any coffee ground emesis. She denies any abdominal pain. She indicates that she has about 2 bowel movements a week and that  her most recent bowel movement was several days ago and it was normal in color and consistency. She denies melena bright red blood per rectum. Vital signs upon presentation to room 62 year old I blood pressure of 203/93 a heart rate of 91. We are asked to admit for further evaluation to   Review of Systems: In point review of systems completed and all systems are negative except as indicated in the history of present illness  Past Medical History  Diagnosis Date  . HTN (hypertension)   . Hypothyroidism   . Uterine cancer 2012   Past Surgical History  Procedure Laterality Date  . Cholecystectomy    . Cesarean section    . Laparoscopic total hysterectomy    . Colonoscopy  2000    [facility], [facility]  . Colonoscopy  2003 NUR BRBPR D50 V6    [facility] [facility], [facility]  . Abdominal hysterectomy    . Nasal septum surgery    . Esophagogastroduodenoscopy (egd) with esophageal dilation N/A 06/08/2013    Procedure: ESOPHAGOGASTRODUODENOSCOPY (EGD) WITH ESOPHAGEAL DILATION;  Surgeon: Sonya Binder, MD;  Location: [facility];  Service: Endoscopy;  Laterality: N/A;  12:30   Social History:  reports that she has never smoked. She does not have any smokeless tobacco history on file. She reports that she does not drink alcohol or use illicit drugs. She lives with her husband. She denies smoking or EtOH use she denies illicit drug use. She ambulates independently. She is independent with ADLs  Allergies  Allergen Reactions  . Cephalexin     Family History  Problem Relation Age of Onset  .  Colon cancer Neg Hx    medical history reviewed and noncontributory to this admit  Prior to Admission medications   Medication Sig Start Date End Date Taking? Authorizing Provider  allopurinol (ZYLOPRIM) 100 MG tablet Take 100 mg by mouth daily.    Historical Provider, MD  amLODipine (NORVASC) 10 MG tablet Take 1 tablet (10 mg total) by mouth daily. 06/13/13   Sonya Dike, MD  aspirin 81 MG tablet Take 81 mg by mouth  daily.      Historical Provider, MD  docusate sodium (COLACE) 100 MG capsule Take 100 mg by mouth 2 (two) times daily.    Historical Provider, MD  ferrous sulfate dried (SLOW FE) 160 (50 FE) MG TBCR Take 160 mg by mouth daily.      Historical Provider, MD  hydrALAZINE (APRESOLINE) 50 MG tablet Take 1 tablet (50 mg total) by mouth every 8 (eight) hours. 06/12/13   Sonya Dike, MD  labetalol (NORMODYNE) 300 MG tablet Take 1 tablet (300 mg total) by mouth 2 (two) times daily. 06/12/13   Sonya Dike, MD  levothyroxine (SYNTHROID, LEVOTHROID) 50 MCG tablet Take 50 mcg by mouth daily.      Historical Provider, MD  MECLIZINE HCL PO Take 20 mg by mouth as needed.     Historical Provider, MD  omeprazole (PRILOSEC) 20 MG capsule 1 po every morning 30 minutes prior to your first meal. 06/08/13   Sonya Binder, MD  sodium bicarbonate 650 MG tablet Take 1 tablet (650 mg total) by mouth 2 (two) times daily. 06/12/13   Sonya Dike, MD   Physical Exam: Filed Vitals:   06/20/13 1429  BP: 203/93  Pulse: 91  Temp: 97.7 F (36.5 C)  Resp: 20     General:  Well-nourished no acute distress  Eyes: Pupils equal round reactive to light, EOMI, no scleral icterus  ENT: Ears are clear nose without drainage oropharynx without erythema or exudate Mucous membranes of her mouth are pink slightly dry  Neck: Supple no JVD full range of motion no lymphadenopathy  Cardiovascular: Regular rate and rhythm no murmur gallop or rub trace lower extremity edema pedal pulses present and palpable  Respiratory: Normal effort breath sounds are clear bilaterally to auscultation. I hear no wheeze or rhonchi  Abdomen: Obese soft positive bowel sounds throughout nontender to palpation no mass organomegaly noted  Skin: Warm and dry no rash or lesion  Musculoskeletal: Joints without swelling/erythema good muscle tone no clubbing or cyanosis  Psychiatric: Calm somewhat teary cooperative  Neurologic: Alert oriented x3  speech clear facial symmetry  Labs on Admission:  Basic Metabolic Panel: No results found for this basename: NA, K, CL, CO2, GLUCOSE, BUN, CREATININE, CALCIUM, MG, PHOS,  in the last 168 hours Liver Function Tests: No results found for this basename: AST, ALT, ALKPHOS, BILITOT, PROT, ALBUMIN,  in the last 168 hours No results found for this basename: LIPASE, AMYLASE,  in the last 168 hours No results found for this basename: AMMONIA,  in the last 168 hours CBC: No results found for this basename: WBC, NEUTROABS, HGB, HCT, MCV, PLT,  in the last 168 hours Cardiac Enzymes: No results found for this basename: CKTOTAL, CKMB, CKMBINDEX, TROPONINI,  in the last 168 hours  BNP (last 3 results) No results found for this basename: PROBNP,  in the last 8760 hours CBG: No results found for this basename: GLUCAP,  in the last 168 hours  Radiological Exams on Admission: No results found.  EKG:  Assessment/Plan Principal Problem:   Anemia: Symptomatic superimposed on anemia of chronic disease. Will admit the patient to telemetry for observation. Will recheck her CBC. She had lab work done yesterday that indicated a hemoglobin of 7.8. Chart review indicates that 8 days ago her hemoglobin was 9. Check an anemia panel stool for occult blood. Transfuse 1 unit of packed RBCs. Early there is no sign of overt bleeding. Will recheck in the a.m. Active Problems:  HTN Urgency: hx of same. Reports not taking home meds yet today. Will increase labatelol to TID and monitor.     HTN (hypertension): Uncontrolled. Patient with history of same. Recent hospitalization revealed difficult to control blood pressure. Patient does indicate she has not taken any of her blood pressure medicines today. Home medications include Norvasc, hydralazine, labetalol. Will resume these medications and monitor closely. Will check her blood pressure before hours x3 and provide when necessary hydralazine as indicated  Chronic kidney  disease stage III. Likely related to uncontrolled hypertension. Chart review indicates that 8 days ago at discharge creatinine was 4.24. Yesterday her creatinine was 4.51. Will recheck this morning. Patient indicates adequate urine output. Appears to be at baseline. Will monitor closely    Anemia in chronic kidney disease: See #1    Gout: Appears stable at baseline. Will continue home medication    Dizzy: Likely related to #1 superimposed on uncontrolled hypertension. Will check orthostatic vital signs. Patient appears only slightly dry. Will gently hydrate with IV fluids. Will monitor closely    Code Status: full Family Communication: daughter at bedside Disposition Plan: hopefully home tomorrow  Time spent: 52 minutes  [facility]   If 7PM-7AM, please contact night-coverage [email or web address] Password [email or web address] 06/20/2013, 3:00 PM

## 2013-06-20 NOTE — Telephone Encounter (Signed)
Please call pt. HER stomach Bx shows gastritis. Her low blood count is likely due to kidney disease. HER IRON STORES ARE NORMAL. CONTINUE Prilosec 30 minutes prior to your first meal. SHE WILL LIKELY NEED A COLONOSCOPY WITHIN THE NEXT 2-3 weeks BECAUSE OF HER LOW BLOOD COUNT AND HER LAST TCS > 10 Years ago.

## 2013-06-21 ENCOUNTER — Telehealth: Payer: Self-pay | Admitting: Gastroenterology

## 2013-06-21 DIAGNOSIS — N179 Acute kidney failure, unspecified: Secondary | ICD-10-CM

## 2013-06-21 DIAGNOSIS — K2970 Gastritis, unspecified, without bleeding: Secondary | ICD-10-CM | POA: Diagnosis present

## 2013-06-21 DIAGNOSIS — R1110 Vomiting, unspecified: Secondary | ICD-10-CM

## 2013-06-21 DIAGNOSIS — K5900 Constipation, unspecified: Secondary | ICD-10-CM | POA: Diagnosis present

## 2013-06-21 LAB — TYPE AND SCREEN
ABO/RH(D): B POS
Antibody Screen: NEGATIVE

## 2013-06-21 LAB — FERRITIN: Ferritin: 431 ng/mL — ABNORMAL HIGH (ref 10–291)

## 2013-06-21 LAB — GLUCOSE, CAPILLARY: Glucose-Capillary: 132 mg/dL — ABNORMAL HIGH (ref 70–99)

## 2013-06-21 LAB — CBC
HCT: 25.4 % — ABNORMAL LOW (ref 36.0–46.0)
MCHC: 31.9 g/dL (ref 30.0–36.0)
MCV: 76.5 fL — ABNORMAL LOW (ref 78.0–100.0)
Platelets: 211 10*3/uL (ref 150–400)
RDW: 20.4 % — ABNORMAL HIGH (ref 11.5–15.5)

## 2013-06-21 LAB — BASIC METABOLIC PANEL
BUN: 37 mg/dL — ABNORMAL HIGH (ref 6–23)
Calcium: 9.1 mg/dL (ref 8.4–10.5)
Chloride: 107 mEq/L (ref 96–112)
Creatinine, Ser: 4.63 mg/dL — ABNORMAL HIGH (ref 0.50–1.10)
GFR calc Af Amer: 11 mL/min — ABNORMAL LOW (ref >90)
GFR calc non Af Amer: 9 mL/min — ABNORMAL LOW (ref >90)

## 2013-06-21 LAB — FOLATE: Folate: 2.5 ng/mL — ABNORMAL LOW

## 2013-06-21 LAB — VITAMIN B12: Vitamin B-12: 452 pg/mL (ref 211–911)

## 2013-06-21 MED ORDER — MAGNESIUM HYDROXIDE 400 MG/5ML PO SUSP: 30.0000 mL | Freq: Every day | ORAL | Status: DC | PRN

## 2013-06-21 MED ORDER — MECLIZINE HCL 12.5 MG PO TABS
25.0000 mg | ORAL_TABLET | Freq: Three times a day (TID) | ORAL | Status: DC
  Administered 2013-06-21 – 2013-06-22 (×3): 25 mg via ORAL
  Filled 2013-06-21 (×3): qty 2

## 2013-06-21 MED ORDER — DARBEPOETIN ALFA-POLYSORBATE 40 MCG/0.4ML IJ SOLN: 40.0000 ug | INTRAMUSCULAR | Status: DC

## 2013-06-21 MED ORDER — ENOXAPARIN SODIUM 30 MG/0.3ML ~~LOC~~ SOLN
30.0000 mg | SUBCUTANEOUS | Status: DC
  Administered 2013-06-21: 30 mg via SUBCUTANEOUS
  Filled 2013-06-21 (×2): qty 0.3

## 2013-06-21 NOTE — Progress Notes (Signed)
[facility] PROGRESS NOTE  Sonya [name] [id number] DOB: 1950-08-27 DOA: 06/20/2013 PCP: [name], MD  Assessment/Plan: Anemia: Symptomatic superimposed on anemia of chronic disease.  S/P 1 units PRBC's Hg 8.1. Anemia panel with Iron 38, Ferritin 431.  No sign of overt bleeding. Discussed with GI who opines OP colonoscopy will be scheduled for 2-3 weeks.    Active Problems:  HTN Urgency: hx of same. Improved control. SBP range 160-180  HTN (hypertension): Recent hospitalization revealed difficult to control blood pressure. Home medications include Norvasc, hydralazine, labetalol. Have increased labetolol and changed norvasc to nifedipine and continued hydralazine. See #2.  Dizzy: Likely related to #1 superimposed on uncontrolled hypertension. Reports no dizziness this am.  Not orthostatic on admission. Will discontinue IV fluids.  Will monitor closely   Nausea/Vomiting: after 10am med pass. May be related to several medications administered at once. Will provide zofran and modify med schedule.   Chronic kidney disease stage IVI. Likely related to uncontrolled hypertension. Chart review indicates that 8 days ago at discharge creatinine was 4.24. Yesterday her creatinine was 4.51. Appreciate nephrology assistance. Appears stable  Anemia in chronic kidney disease: See #1   Gout: Appears stable at baseline. Will continue home medication   Gastritis: per Stomach Bx per Dr. Nona Dell note. Will continue PPI. Will need colonoscopy and office will contact her   Constipation: reports having difficulty with BM. Currently on stool softner. Requests MOM. Monitor   Code Status: full Family Communication: none present Disposition Plan: home when ready hopefully tomorrow   Consultants:  Dr. Lowanda Foster nephrology  Procedures:  none  Antibiotics:  none  HPI/Subjective: Sitting in chair. Reports no dizziness with walking.   Objective: Filed Vitals:   06/21/13 0624   BP: 160/74  Pulse: 93  Temp: 98.4 F (36.9 C)  Resp: 18    Intake/Output Summary (Last 24 hours) at 06/21/13 1024 Last data filed at 06/21/13 1000  Gross per 24 hour  Intake 1102.5 ml  Output   1000 ml  Net  102.5 ml   Filed Weights   06/20/13 1610  Weight: 89.1 kg (196 lb 6.9 oz)    Exam:   General:  Well nourished NAD  Cardiovascular: RRR No MGR Trace LE edema  Respiratory: Normal effort BS clear bilaterally no wheeze no rhonchi  Abdomen: obese soft +BS non-tender to palpation.   Musculoskeletal: no clubbing no cyanosis   Data Reviewed: Basic Metabolic Panel:  Recent Labs Lab 06/20/13 1516 06/21/13 0453  NA 139 139  K 3.8 3.8  CL 104 107  CO2 24 22  GLUCOSE 112* 92  BUN 37* 37*  CREATININE 4.78* 4.63*  CALCIUM 9.7 9.1   Liver Function Tests:  Recent Labs Lab 06/20/13 1516  AST 11  ALT 6  ALKPHOS 63  BILITOT 0.2*  PROT 7.3  ALBUMIN 3.2*   No results found for this basename: LIPASE, AMYLASE,  in the last 168 hours No results found for this basename: AMMONIA,  in the last 168 hours CBC:  Recent Labs Lab 06/20/13 1516 06/21/13 0453  WBC 7.0 6.8  NEUTROABS 5.1  --   HGB 7.7* 8.1*  HCT 24.7* 25.4*  MCV 77.7* 76.5*  PLT 251 211   Cardiac Enzymes: No results found for this basename: CKTOTAL, CKMB, CKMBINDEX, TROPONINI,  in the last 168 hours BNP (last 3 results) No results found for this basename: PROBNP,  in the last 8760 hours CBG: No results found for this basename: GLUCAP,  in the last 168  hours  Recent Results (from the past 240 hour(s))  MRSA PCR SCREENING     Status: None   Collection Time    06/20/13  2:55 PM      Result Value Range Status   MRSA by PCR NEGATIVE  NEGATIVE Final   Comment:            The GeneXpert MRSA Assay (FDA     approved for NASAL specimens     only), is one component of a     comprehensive MRSA colonization     surveillance program. It is not     intended to diagnose MRSA     infection nor to  guide or     monitor treatment for     MRSA infections.     Studies: No results found.  Scheduled Meds: . allopurinol  100 mg Oral Daily  . calcitRIOL  0.25 mcg Oral Daily  . [START ON 06/27/2013] darbepoetin  40 mcg Subcutaneous Q7 days  . docusate sodium  100 mg Oral BID  . enoxaparin (LOVENOX) injection  40 mg Subcutaneous Q24H  . ferrous sulfate  325 mg Oral Q breakfast  . hydrALAZINE  50 mg Oral Q8H  . labetalol  300 mg Oral TID  . levothyroxine  50 mcg Oral QAC breakfast  . NIFEdipine  90 mg Oral Daily  . pantoprazole  40 mg Oral Daily  . sodium bicarbonate  650 mg Oral BID  . sodium chloride  3 mL Intravenous Q12H   Continuous Infusions:   Principal Problem:   Anemia Active Problems:   HTN (hypertension)   Hypertensive urgency   Anemia in chronic kidney disease   Gout   Dizzy   Chronic kidney disease   Unspecified constipation  Time spent: 30 minutes    [facility] Pager [phone]. If 7PM-7AM, please contact night-coverage at [email or web address], password [email or web address] 06/21/2013, 10:24 AM  LOS: 1 day

## 2013-06-21 NOTE — Telephone Encounter (Signed)
Results Cc to PCP  

## 2013-06-21 NOTE — Telephone Encounter (Signed)
Dyanne Carrel, NP notified me of patient's admission for acute on chronic anemia and hypertension. EGD 06/08/13 with findings of esophageal stricture and gastritis. Admitted post-procedure for hypertension at that time and was transfused.  Hgb 9 on 06/10/13. On admission yesterday, Hgb 7.7. No overt GI bleeding. Remote colonoscopy. Discussed with Dr. Gala Romney. Proceed with outpatient evaluation ie colonoscopy in next 2-3 weeks as planned by Dr. Oneida Alar. We will make arrangements after her discharge.

## 2013-06-21 NOTE — Telephone Encounter (Signed)
REVIEWED.  

## 2013-06-21 NOTE — Progress Notes (Signed)
UR completed 

## 2013-06-21 NOTE — Progress Notes (Signed)
Patient seen and examined.  Agree with note as above  At the time of my visit, patient is bent over emesis basin and is vomiting.  Reports that she thinks she took too many pills at once and now is nauseous.  Overall she is feeling better. Suspect that most of her symptoms may be related to her high blood pressure. She has been transfused one unit of prbc with improvement in hemoglobin and has also received epogen. Creatinine is near baseline.  Will continue to monitor for today, and if she clinically improves then anticipate discharge home in the next 24 hours.  Deborh Pense

## 2013-06-21 NOTE — Progress Notes (Signed)
Subjective: Interval History: has no complaint of complaints of nausea or vomiting. Her appetite is not that great. She denies any difficulty breathing but  she says that she still feels weak..  Objective: Vital signs in last 24 hours: Temp:  [97.7 F (36.5 C)-98.5 F (36.9 C)] 98.4 F (36.9 C) (11/20 0624) Pulse Rate:  [91-99] 93 (11/20 0624) Resp:  [18-20] 18 (11/20 0624) BP: (160-203)/(72-93) 160/74 mmHg (11/20 0624) SpO2:  [96 %-100 %] 96 % (11/20 0624) Weight:  [89.1 kg (196 lb 6.9 oz)] 89.1 kg (196 lb 6.9 oz) (11/19 1610) Weight change:   Intake/Output from previous day: 11/19 0701 - 11/20 0700 In: 1102.5 [P.O.:240; I.V.:850; Blood:12.5] Out: -  Intake/Output this shift:    General appearance: alert, cooperative and no distress Resp: clear to auscultation bilaterally Cardio: regular rate and rhythm, S1, S2 normal, no murmur, click, rub or gallop GI: soft, non-tender; bowel sounds normal; no masses,  no organomegaly Extremities: extremities normal, atraumatic, no cyanosis or edema  Lab Results:  Recent Labs  06/20/13 1516 06/21/13 0453  WBC 7.0 6.8  HGB 7.7* 8.1*  HCT 24.7* 25.4*  PLT 251 211   BMET:  Recent Labs  06/20/13 1516 06/21/13 0453  NA 139 139  K 3.8 3.8  CL 104 107  CO2 24 22  GLUCOSE 112* 92  BUN 37* 37*  CREATININE 4.78* 4.63*  CALCIUM 9.7 9.1   No results found for this basename: PTH,  in the last 72 hours Iron Studies:  Recent Labs  06/20/13 1518  IRON 38*  TIBC 148*  FERRITIN 431*    Studies/Results: No results found.  I have reviewed the patient's current medications.  Assessment/Plan: Problem #1 renal failure. Chronic and stage IV. Presently she doesn't have any uremic sinus symptoms. Her renal function at this moment seems to be stable and her potassium is good. Problem #2 anemia. Presently her iron saturation has improved from the last time. Her ferritin remains high. Patient on oral iron. Presently her anemia could be  secondary to chronic renal failure. Patient on erythropoietin. Problem #3 metabolic bone disease. Patient with high PTH and she started on calcitriol. Her calcium is was in acceptable range. Problem #4 hypertension her blood pressure seems to be still high. Presently patient on nifedipine, labetalol and hydralazine. Problem #5 hypothyroidism she is on Synthroid. Problem #6 history of gout Problem #7 history of leg edema that has improved. Plan: We'll increase her ARANESP to 40 mcg subcutaneous once a week.           We'll check her basic metabolic panel and phosphorus in the morning.   LOS: 1 day   Caralynn Gelber S 06/21/2013,7:50 AM

## 2013-06-21 NOTE — Care Management Note (Addendum)
    Page 1 of 1   06/22/2013     3:26:31 PM   CARE MANAGEMENT NOTE 06/22/2013  Patient:  DESTINAE, SAR   Account Number:  [id number]  Date Initiated:  06/21/2013  Documentation initiated by:  Theophilus Kinds  Subjective/Objective Assessment:   Pt admitted from home with anemia. Pt lives with her husband and will return home at discharge. Pt is independent with ADL's.     Action/Plan:   No CM needs noted. PT received a MATCH voucher on last admission. Pt is aware that voucher cannot be given this admission.   Anticipated DC Date:  06/22/2013   Anticipated DC Plan:  [place]  CM consult      Choice offered to / List presented to:             Status of service:  Completed, signed off Medicare Important Message given?   (If response is "NO", the following Medicare IM given date fields will be blank) Date Medicare IM given:   Date Additional Medicare IM given:    Discharge Disposition:  HOME/SELF CARE  Per UR Regulation:    If discussed at Long Length of Stay Meetings, dates discussed:    Comments:  06/22/13 [place], RN BSN CM Pt discharging home today. No CM needs noted.  06/21/13 [place], RN BSN CM

## 2013-06-21 NOTE — Telephone Encounter (Signed)
Called. Many rings and no answer.  

## 2013-06-22 DIAGNOSIS — D631 Anemia in chronic kidney disease: Secondary | ICD-10-CM

## 2013-06-22 LAB — CBC
Hemoglobin: 7.9 g/dL — ABNORMAL LOW (ref 12.0–15.0)
MCHC: 32.1 g/dL (ref 30.0–36.0)
MCV: 76.2 fL — ABNORMAL LOW (ref 78.0–100.0)
Platelets: 215 10*3/uL (ref 150–400)
RBC: 3.23 MIL/uL — ABNORMAL LOW (ref 3.87–5.11)
RDW: 20 % — ABNORMAL HIGH (ref 11.5–15.5)

## 2013-06-22 LAB — BASIC METABOLIC PANEL
Calcium: 9.2 mg/dL (ref 8.4–10.5)
Chloride: 106 mEq/L (ref 96–112)
Creatinine, Ser: 5 mg/dL — ABNORMAL HIGH (ref 0.50–1.10)
GFR calc Af Amer: 10 mL/min — ABNORMAL LOW (ref >90)
GFR calc non Af Amer: 8 mL/min — ABNORMAL LOW (ref >90)
Potassium: 3.9 mEq/L (ref 3.5–5.1)

## 2013-06-22 MED ORDER — ALPRAZOLAM 0.25 MG PO TABS: 0.2500 mg | ORAL_TABLET | Freq: Two times a day (BID) | ORAL | Status: DC | PRN

## 2013-06-22 MED ORDER — DARBEPOETIN ALFA-POLYSORBATE 40 MCG/0.4ML IJ SOLN: 40.0000 ug | INTRAMUSCULAR | Status: DC

## 2013-06-22 MED ORDER — NIFEDIPINE ER OSMOTIC RELEASE 90 MG PO TB24: 90.0000 mg | ORAL_TABLET | Freq: Every day | ORAL | Status: DC

## 2013-06-22 MED ORDER — LABETALOL HCL 300 MG PO TABS: 300.0000 mg | ORAL_TABLET | Freq: Three times a day (TID) | ORAL | Status: DC

## 2013-06-22 NOTE — Progress Notes (Signed)
Subjective: Interval History:  She denies any difficulty breathing and feels better. Denies any nausea or vomiting. She is regaining her strength back.  Objective: Vital signs in last 24 hours: Temp:  [97.8 F (36.6 C)-98.9 F (37.2 C)] 98.9 F (37.2 C) (11/21 0544) Pulse Rate:  [75-87] 83 (11/21 0544) Resp:  [18] 18 (11/21 0544) BP: (128-152)/(55-91) 135/73 mmHg (11/21 0544) SpO2:  [94 %-99 %] 97 % (11/21 0544) Weight change:   Intake/Output from previous day: 11/20 0701 - 11/21 0700 In: 1260 [P.O.:960; I.V.:300] Out: 2000 [Urine:1850; Emesis/NG output:150] Intake/Output this shift:    General appearance: alert, cooperative and no distress Resp: clear to auscultation bilaterally Cardio: regular rate and rhythm, S1, S2 normal, no murmur, click, rub or gallop GI: soft, non-tender; bowel sounds normal; no masses,  no organomegaly Extremities: extremities normal, atraumatic, no cyanosis or edema  Lab Results:  Recent Labs  06/21/13 0453 06/22/13 0501  WBC 6.8 6.7  HGB 8.1* 7.9*  HCT 25.4* 24.6*  PLT 211 215   BMET:   Recent Labs  06/21/13 0453 06/22/13 0501  NA 139 139  K 3.8 3.9  CL 107 106  CO2 22 22  GLUCOSE 92 92  BUN 37* 39*  CREATININE 4.63* 5.00*  CALCIUM 9.1 9.2   No results found for this basename: PTH,  in the last 72 hours Iron Studies:   Recent Labs  06/20/13 1518  IRON 38*  TIBC 148*  FERRITIN 431*    Studies/Results: No results found.  I have reviewed the patient's current medications.  Assessment/Plan: Problem #1 renal failure. Chronic her renal function increasing slightly and presently reaching stage V. Patient how ever remains asymptomatic Problem #2 anemia. Presently her iron saturation has improved from the last time. Her ferritin remains high. Patient on oral iron and on ARANESP her hemoglobin and hematocrit is declining Problem #3 metabolic bone disease. Patient with high PTH and she started on calcitriol. Her calcium and  phosphorus  is with  in acceptable range. Problem #4 hypertension her blood pressure seems to be still high. Presently patient on nifedipine, labetalol and hydralazine. Problem #5 hypothyroidism she is on Synthroid. Problem #6 history of gout Problem #7 history of leg edema that has improved. Plan: We will continue with present treatment          Presently patient does not dialysis . Patient has agreed and if she needs dialysis we will put tunneled catheter and we will initaite dialysis.   BMET and CBC in am.   LOS: 2 days   Karilynn Carranza S 06/22/2013,7:25 AM

## 2013-06-22 NOTE — Progress Notes (Signed)
Pt discharged home today per Dr. Roderic Palau. Pt's IV site D/C'd and WNL. Pt's VS stable at this time. Pt provided with home medication list, discharge instructions and prescriptions. Verbalized understanding. Pt left floor via WC in stable condition accompanied by NT.

## 2013-06-22 NOTE — Progress Notes (Signed)
Pt OOB to chair with standby assistance. No c/o pain or dizziness at this time. Will continue to monitor.

## 2013-06-22 NOTE — Discharge Summary (Signed)
Physician Discharge Summary  Sonya [name] [id number] DOB: [birth year] DOA: 06/20/2013  PCP: [name], MD  Admit date: 06/20/2013 Discharge date: 06/22/2013  Time spent: 45 minutes  Recommendations for Outpatient Follow-up:  1. Follow up with Nephrology in 2 weeks.  Consideration will need to be made on placing a dialysis catheter in the near future 2. Follow up on 07/09/13 at [facility] for aranesp injection for anemia of chronic kidney disease 3. Follow up with primary care physician  Discharge Diagnoses:  Principal Problem:   Anemia Active Problems:   HTN (hypertension)   Hypertensive urgency   Anemia in chronic kidney disease   Gout   Dizzy   Chronic kidney disease   Unspecified constipation   Gastritis   Discharge Condition: improved  Diet recommendation: low salt  Filed Weights   06/20/13 1610  Weight: 89.1 kg (196 lb 6.9 oz)    History of present illness:  Sonya [name] is a very pleasant 62 y.o. female with a past medical history that includes uncontrolled hypertension, chronic kidney disease stage III, anemia and chronic kidney disease, gout, GERD, reports to the emergency room from her home under the instructions of Dr. Gerarda Fraction her primary care provider do to hemoglobin of 7.8. In formation is obtained from the patient. She indicated that she was released from the hospital 7 days ago and saw her primary care provider yesterday for her post hospital followup. During her hospitalization she was found to be severely hypertensive and was started on several medications. She indicates that according to the blood pressure she had yesterday at her primary care provider's office there's been little improvement in blood pressure control. He states that yesterday when she saw her primary care provider adjustments were made to her medications again and lab work was drawn. It was today that her primary care provider called her and recommended that she come  to the emergency room due to a drop in her hemoglobin. She reports that she has been dizzy since she left the hospital. She denies any worsening of that dizziness. She states that when she is sitting or lying down she does not feel dizzy. It is only when she gets up and walks that she feels dizzy. She indicates that the dizziness lasts for as long as she is walking. She denies any room spinning sensation chest pain palpitation shortness of breath headache syncope or near-syncope to Associated symptoms include weakness, anorexia, nausea with one episode of emesis 5 days ago. She denies any coffee ground emesis. She denies any abdominal pain. She indicates that she has about 2 bowel movements a week and that her most recent bowel movement was several days ago and it was normal in color and consistency. She denies melena bright red blood per rectum. Vital signs upon presentation to room 309 were blood pressure of 203/93 a heart rate of 91. We are asked to admit for further evaluation    Hospital Course:  This patient was admitted to the hospital with generalized weakness and dizziness. She was found to be significantly hypertensive on admission with a systolic blood pressure greater than 200. Renal function as well as hemoglobin were near levels prior to her previous discharge. The patient was transfused 1 unit of PRBC for possible symptomatic anemia. Her antihypertensive medications were adjusted and blood pressure is currently 130/70. Patient feels significantly improved, she no longer feels dizzy. She does feel weak, but this is likely deconditioning. She was started on Aranesp per nephrology  and recommended that patient be on this medication weekly. Regarding her renal function, it appears to be relatively stable, although abnormal. It is felt that patient will likely need dialysis in the future. She will follow up with nephrology in 2 weeks for repeat renal function. At that time if her creatinine has  continued to deteriorate, she will be referred for permacath placement for dialysis. She has been [facility] at the [facility] to receive weekly Epogen injections. She's been cleared for discharge by nephrology. Her dizziness also improved. She's been started on meclizine has been advised to continue this on discharge. Regarding her anemia, as well as his anemia of chronic disease. She's already had an EGD done in the past several weeks. She is scheduled for outpatient colonoscopy the next 2-3 weeks.  Procedures:  none  Consultations:  nephrology  Discharge Exam: Filed Vitals:   06/22/13 1426  BP: 130/70  Pulse: 77  Temp: 98 F (36.7 C)  Resp: 18    General: NAD Cardiovascular: S1, S2 RRR Respiratory: CTA B  Discharge Instructions   Future Appointments Provider Department Dept Phone   07/09/2013 9:00 AM [facility] Provider Emily [phone]       Medication List    STOP taking these medications       amLODipine 10 MG tablet  Commonly known as:  NORVASC      TAKE these medications       allopurinol 100 MG tablet  Commonly known as:  ZYLOPRIM  Take 100 mg by mouth daily.     aspirin 81 MG tablet  Take 81 mg by mouth daily.     darbepoetin 40 MCG/0.4ML Soln injection  Commonly known as:  ARANESP  Inject 0.4 mLs (40 mcg total) into the skin every 7 (seven) days.  Start taking on:  06/27/2013     docusate sodium 100 MG capsule  Commonly known as:  COLACE  Take 100 mg by mouth 2 (two) times daily.     ferrous sulfate dried 160 (50 FE) MG Tbcr SR tablet  Commonly known as:  SLOW FE  Take 160 mg by mouth daily.     hydrALAZINE 50 MG tablet  Commonly known as:  APRESOLINE  Take 1 tablet (50 mg total) by mouth every 8 (eight) hours.     labetalol 300 MG tablet  Commonly known as:  NORMODYNE  Take 1 tablet (300 mg total) by mouth 3 (three) times daily.     levothyroxine 50 MCG tablet  Commonly known as:  SYNTHROID,  LEVOTHROID  Take 50 mcg by mouth daily.     meclizine 25 MG tablet  Commonly known as:  ANTIVERT  Take 25 mg by mouth 3 (three) times daily as needed for dizziness.     NIFEdipine 90 MG 24 hr tablet  Commonly known as:  PROCARDIA XL/ADALAT-CC  Take 1 tablet (90 mg total) by mouth daily.     omeprazole 20 MG capsule  Commonly known as:  PRILOSEC  1 po every morning 30 minutes prior to your first meal.     sodium bicarbonate 650 MG tablet  Take 1 tablet (650 mg total) by mouth 2 (two) times daily.       Allergies  Allergen Reactions  . Cephalexin        Follow-up Information   Follow up with [facility] S, MD In 2 weeks.   Specialty:  Nephrology   Contact information:   [address]  [place] [address] [phone]       Follow up with [facility]th floor hematology clinic for aranesp injections. (07/09/13 at 9:00am)        The results of significant diagnostics from this hospitalization (including imaging, microbiology, ancillary and laboratory) are listed below for reference.    Significant Diagnostic Studies: Dg Chest 2 View  06/07/2013   CLINICAL DATA:  Dysphagia  EXAM: CHEST  2 VIEW  COMPARISON:  05/08/2010  FINDINGS: Cardiac silhouette is normal in size. The aorta is mildly uncoiled and tortuous. No mediastinal or hilar masses. Clear lungs. No pleural effusion or pneumothorax.  The bony thorax is intact.  IMPRESSION: No active cardiopulmonary disease.   Electronically Signed   By: Lajean Manes M.D.   On: 06/07/2013 13:56   US Renal  06/09/2013   CLINICAL DATA:  Acute on chronic renal failure  EXAM: RENAL/URINARY TRACT ULTRASOUND COMPLETE  COMPARISON:  None.  FINDINGS: Right Kidney  Length: 10.4 cm. The renal cortex is increased in echogenicity. There are to anechoic areas within the upper pole measuring 9 x 8 x 9 mm and 1.3 x 1.1 x 1.4 cm with increased through transmission most consistent with tiny cysts.  Left Kidney  Length: 12.9 cm. The renal  cortex is increased in echogenicity. There are two anechoic areas within the left kidney. There is an anechoic area in the upper pole measuring 1.8 x 1.5 x 1.7 cm most consistent with a cyst. There is a 1.2 x 1.2 x 1.2 cm anechoic area in the left midpole likely reflecting a cyst.  Bladder  Appears normal for degree of bladder distention.  IMPRESSION: 1.  No obstructive uropathy.  2. Increased renal cortical echogenicity bilaterally as can be seen with medical renal disease.  3.  Small bilateral renal cysts.   Electronically Signed   By: Kathreen Devoid   On: 06/09/2013 09:59    Microbiology: Recent Results (from the past 240 hour(s))  MRSA PCR SCREENING     Status: None   Collection Time    06/20/13  2:55 PM      Result Value Range Status   MRSA by PCR NEGATIVE  NEGATIVE Final   Comment:            The GeneXpert MRSA Assay (FDA     approved for NASAL specimens     only), is one component of a     comprehensive MRSA colonization     surveillance program. It is not     intended to diagnose MRSA     infection nor to guide or     monitor treatment for     MRSA infections.     Labs: Basic Metabolic Panel:  Recent Labs Lab 06/20/13 1516 06/21/13 0453 06/22/13 0501  NA 139 139 139  K 3.8 3.8 3.9  CL 104 107 106  CO2 24 22 22   GLUCOSE 112* 92 92  BUN 37* 37* 39*  CREATININE 4.78* 4.63* 5.00*  CALCIUM 9.7 9.1 9.2  PHOS  --   --  4.0   Liver Function Tests:  Recent Labs Lab 06/20/13 1516  AST 11  ALT 6  ALKPHOS 63  BILITOT 0.2*  PROT 7.3  ALBUMIN 3.2*   No results found for this basename: LIPASE, AMYLASE,  in the last 168 hours No results found for this basename: AMMONIA,  in the last 168 hours CBC:  Recent Labs Lab 06/20/13 1516 06/21/13 0453 06/22/13 0501  WBC 7.0 6.8 6.7  NEUTROABS  5.1  --   --   HGB 7.7* 8.1* 7.9*  HCT 24.7* 25.4* 24.6*  MCV 77.7* 76.5* 76.2*  PLT 251 211 215   Cardiac Enzymes: No results found for this basename: CKTOTAL, CKMB, CKMBINDEX,  TROPONINI,  in the last 168 hours BNP: BNP (last 3 results) No results found for this basename: PROBNP,  in the last 8760 hours CBG:  Recent Labs Lab 06/21/13 1746  GLUCAP 132*       Signed:  Waylin Dorko  [facility] 06/22/2013, 3:09 PM

## 2013-06-25 NOTE — Telephone Encounter (Signed)
Sorry, RMR pt.

## 2013-06-25 NOTE — Telephone Encounter (Signed)
Called and informed pt. She said she will call back when she is ready to schedule colonoscopy.

## 2013-06-25 NOTE — Telephone Encounter (Signed)
Pt is a [facility] pt.

## 2013-07-09 ENCOUNTER — Encounter (HOSPITAL_COMMUNITY): Payer: Self-pay | Attending: Hematology and Oncology

## 2013-07-09 VITALS — BP 160/79 | HR 90 | Temp 97.5°F | Resp 18 | Ht 66.0 in | Wt 191.0 lb

## 2013-07-09 DIAGNOSIS — I10 Essential (primary) hypertension: Secondary | ICD-10-CM

## 2013-07-09 DIAGNOSIS — D631 Anemia in chronic kidney disease: Secondary | ICD-10-CM

## 2013-07-09 DIAGNOSIS — N179 Acute kidney failure, unspecified: Secondary | ICD-10-CM

## 2013-07-09 DIAGNOSIS — N2581 Secondary hyperparathyroidism of renal origin: Secondary | ICD-10-CM

## 2013-07-09 NOTE — Progress Notes (Signed)
REVIEWED.  

## 2013-07-09 NOTE — Progress Notes (Signed)
[place] A. Barnet Glasgow, M.D.  NEW PATIENT EVALUATION   Name: Sonya [name] Date: 07/09/2013 MRN: [id number] DOB: 1950/12/08  PCP: [name], MD   REFERRING PHYSICIAN: Redmond School, MD  REASON FOR REFERRAL: Outpatient management of anemia     HISTORY OF PRESENT ILLNESS:Sonya [name] is a 62 y.o. female who is referred for management of outpatient anemia, multifactorial, with contributions from blood loss as well as end-stage renal disease with erythropoietin deficiency. As an inpatient in November 2014 she was started on Aranesp with doses up to 40 mcg once per week. She does not lead a very active life and for that reason does not feel excessively fatigued or short of breath. She denies any PND, orthopnea, palpitations, headache, or seizures but does have extremity swelling at the end of the day. She does urinate. She denies urinary hesitancy, diarrhea, constipation, dysuria, cough, wheezing, skin rash, joint pain, melena, hematochezia, hematuria, vaginal bleeding, hemoptysis, or epistaxis. She has blood work ordered for nephrology visit in 10 days to be performed in 7 days.  PAST MEDICAL HISTORY:  has a past medical history of HTN (hypertension); Hypothyroidism; and Uterine cancer (2012).     PAST SURGICAL HISTORY: Past Surgical History  Procedure Laterality Date  . Cholecystectomy    . Cesarean section    . Laparoscopic total hysterectomy    . Colonoscopy  2000    TICS, IH  . Colonoscopy  2003 NUR BRBPR D50 V6    [facility]/[place] [facility], [facility]  . Abdominal hysterectomy    . Nasal septum surgery    . Esophagogastroduodenoscopy (egd) with esophageal dilation N/A 06/08/2013    Procedure: ESOPHAGOGASTRODUODENOSCOPY (EGD) WITH ESOPHAGEAL DILATION;  Surgeon: Danie Binder, MD;  Location: [facility];  Service: Endoscopy;  Laterality: N/A;  12:30     CURRENT MEDICATIONS: has a current medication list which includes the following  prescription(s): allopurinol, aspirin, docusate sodium, ferrous sulfate dried, hydralazine, labetalol, levothyroxine, meclizine, nifedipine, omeprazole, sodium bicarbonate, and darbepoetin.   ALLERGIES: Cephalexin   SOCIAL HISTORY:  reports that she has never smoked. She does not have any smokeless tobacco history on file. She reports that she does not drink alcohol or use illicit drugs.   FAMILY HISTORY: family history is negative for Colon cancer.    REVIEW OF SYSTEMS:  Other than that discussed above is noncontributory.    PHYSICAL EXAM:  height is 5\' 6"  (1.676 m) and weight is 191 lb (86.637 kg). Her oral temperature is 97.5 F (36.4 C). Her blood pressure is 160/79 and her pulse is 90. Her respiration is 18.    GENERAL:alert, no distress and comfortable SKIN: skin color, texture, turgor are normal, no rashes or significant lesions EYES: normal, Conjunctiva are pink and non-injected, sclera clear OROPHARYNX:no exudate, no erythema and lips, buccal mucosa, and tongue normal  NECK: supple, thyroid normal size, non-tender, without nodularity CHEST: Normal AP diameter with no breast masses. LYMPH:  no palpable lymphadenopathy in the cervical, axillary or inguinal LUNGS: clear to auscultation and percussion with normal breathing effort HEART: regular rate & rhythm and no murmurs ABDOMEN:abdomen soft, non-tender and normal bowel sounds MUSCULOSKELETALl:no cyanosis of digits, no clubbing or edema  NEURO: alert & oriented x 3 with fluent speech, no focal motor/sensory deficits    LABORATORY DATA:  Admission on 06/20/2013, Discharged on 06/22/2013  Component Date Value Range Status  . Sodium 06/20/2013 139  135 - 145 mEq/L Final  .  Potassium 06/20/2013 3.8  3.5 - 5.1 mEq/L Final  . Chloride 06/20/2013 104  96 - 112 mEq/L Final  . CO2 06/20/2013 24  19 - 32 mEq/L Final  . Glucose, Bld 06/20/2013 112* 70 - 99 mg/dL Final  . BUN 06/20/2013 37* 6 - 23 mg/dL Final  . Creatinine, Ser  06/20/2013 4.78* 0.50 - 1.10 mg/dL Final  . Calcium 06/20/2013 9.7  8.4 - 10.5 mg/dL Final  . Total Protein 06/20/2013 7.3  6.0 - 8.3 g/dL Final  . Albumin 06/20/2013 3.2* 3.5 - 5.2 g/dL Final  . AST 06/20/2013 11  0 - 37 U/L Final  . ALT 06/20/2013 6  0 - 35 U/L Final  . Alkaline Phosphatase 06/20/2013 63  39 - 117 U/L Final  . Total Bilirubin 06/20/2013 0.2* 0.3 - 1.2 mg/dL Final  . GFR calc non Af Amer 06/20/2013 9* >90 mL/min Final  . GFR calc Af Amer 06/20/2013 10* >90 mL/min Final   Comment: (NOTE)                          The eGFR has been calculated using the CKD EPI equation.                          This calculation has not been validated in all clinical situations.                          eGFR's persistently <90 mL/min signify possible Chronic Kidney                          Disease.  . WBC 06/20/2013 7.0  4.0 - 10.5 K/uL Final  . RBC 06/20/2013 3.18* 3.87 - 5.11 MIL/uL Final  . Hemoglobin 06/20/2013 7.7* 12.0 - 15.0 g/dL Final  . HCT 06/20/2013 24.7* 36.0 - 46.0 % Final  . MCV 06/20/2013 77.7* 78.0 - 100.0 fL Final  . MCH 06/20/2013 24.2* 26.0 - 34.0 pg Final  . MCHC 06/20/2013 31.2  30.0 - 36.0 g/dL Final  . RDW 06/20/2013 20.0* 11.5 - 15.5 % Final  . Platelets 06/20/2013 251  150 - 400 K/uL Final  . Neutrophils Relative % 06/20/2013 72  43 - 77 % Final  . Lymphocytes Relative 06/20/2013 19  12 - 46 % Final  . Monocytes Relative 06/20/2013 7  3 - 12 % Final  . Eosinophils Relative 06/20/2013 2  0 - 5 % Final  . Basophils Relative 06/20/2013 0  0 - 1 % Final  . Neutro Abs 06/20/2013 5.1  1.7 - 7.7 K/uL Final  . Lymphs Abs 06/20/2013 1.3  0.7 - 4.0 K/uL Final  . Monocytes Absolute 06/20/2013 0.5  0.1 - 1.0 K/uL Final  . Eosinophils Absolute 06/20/2013 0.1  0.0 - 0.7 K/uL Final  . Basophils Absolute 06/20/2013 0.0  0.0 - 0.1 K/uL Final  . Smear Review 06/20/2013 PLATELET COUNT CONFIRMED BY SMEAR   Final   Comment: LARGE PLATELETS PRESENT                          GIANT  PLATELETS SEEN  . TSH 06/20/2013 1.425  0.350 - 4.500 uIU/mL Final   Performed at [facility]  . Color, Urine 06/20/2013 YELLOW  YELLOW Final  . APPearance 06/20/2013 CLEAR  CLEAR Final  . Specific Gravity, Urine  06/20/2013 1.025  1.005 - 1.030 Final  . pH 06/20/2013 6.0  5.0 - 8.0 Final  . Glucose, UA 06/20/2013 NEGATIVE  NEGATIVE mg/dL Final  . Hgb urine dipstick 06/20/2013 SMALL* NEGATIVE Final  . Bilirubin Urine 06/20/2013 NEGATIVE  NEGATIVE Final  . Ketones, ur 06/20/2013 NEGATIVE  NEGATIVE mg/dL Final  . Protein, ur 06/20/2013 >300* NEGATIVE mg/dL Final  . Urobilinogen, UA 06/20/2013 0.2  0.0 - 1.0 mg/dL Final  . Nitrite 06/20/2013 NEGATIVE  NEGATIVE Final  . Leukocytes, UA 06/20/2013 NEGATIVE  NEGATIVE Final  . Order Confirmation 06/20/2013 ORDER PROCESSED BY BLOOD BANK   Final  . Vitamin B-12 06/20/2013 452  211 - 911 pg/mL Final   Performed at [facility]  . Folate 06/20/2013 2.5*  Final   Comment: (NOTE)                          Reference Ranges                                 Deficient:       0.4 - 3.3 ng/mL                                 Indeterminate:   3.4 - 5.4 ng/mL                                 Normal:              > 5.4 ng/mL                          Performed at [facility]  . Iron 06/20/2013 38* 42 - 135 ug/dL Final  . TIBC 06/20/2013 148* 250 - 470 ug/dL Final  . Saturation Ratios 06/20/2013 26  20 - 55 % Final  . UIBC 06/20/2013 110* 125 - 400 ug/dL Final   Performed at [facility]  . Ferritin 06/20/2013 431* 10 - 291 ng/mL Final   Performed at [facility]  . Retic Ct Pct 06/20/2013 1.0  0.4 - 3.1 % Final  . RBC. 06/20/2013 3.26* 3.87 - 5.11 MIL/uL Final  . Retic Count, Manual 06/20/2013 32.6  19.0 - 186.0 K/uL Final  . MRSA by PCR 06/20/2013 NEGATIVE  NEGATIVE Final   Comment:                                 The GeneXpert MRSA Assay (FDA                          approved for NASAL specimens                           only), is one component of a                          comprehensive MRSA colonization                          surveillance program. It is not  intended to diagnose MRSA                          infection nor to guide or                          monitor treatment for                          MRSA infections.  . ABO/RH(D) 06/20/2013 B POS   Final  . Antibody Screen 06/20/2013 NEG   Final  . Sample Expiration 06/20/2013 06/23/2013   Final  . Unit Number 06/20/2013 [id number]   Final  . Blood Component Type 06/20/2013 RED CELLS,LR   Final  . Unit division 06/20/2013 00   Final  . Status of Unit 06/20/2013 ISSUED,FINAL   Final  . Transfusion Status 06/20/2013 OK TO TRANSFUSE   Final  . Crossmatch Result 06/20/2013 Compatible   Final  . Squamous Epithelial / LPF 06/20/2013 FEW* RARE Final  . WBC, UA 06/20/2013 0-2  <3 WBC/hpf Final  . RBC / HPF 06/20/2013 0-2  <3 RBC/hpf Final  . Bacteria, UA 06/20/2013 FEW* RARE Final  . Urine-Other 06/20/2013 AMORPHOUS URATES/PHOSPHATES   Final  . WBC 06/21/2013 6.8  4.0 - 10.5 K/uL Final  . RBC 06/21/2013 3.32* 3.87 - 5.11 MIL/uL Final  . Hemoglobin 06/21/2013 8.1* 12.0 - 15.0 g/dL Final  . HCT 06/21/2013 25.4* 36.0 - 46.0 % Final  . MCV 06/21/2013 76.5* 78.0 - 100.0 fL Final  . MCH 06/21/2013 24.4* 26.0 - 34.0 pg Final  . MCHC 06/21/2013 31.9  30.0 - 36.0 g/dL Final  . RDW 06/21/2013 20.4* 11.5 - 15.5 % Final  . Platelets 06/21/2013 211  150 - 400 K/uL Final   SPECIMEN CHECKED FOR CLOTS  . Sodium 06/21/2013 139  135 - 145 mEq/L Final  . Potassium 06/21/2013 3.8  3.5 - 5.1 mEq/L Final  . Chloride 06/21/2013 107  96 - 112 mEq/L Final  . CO2 06/21/2013 22  19 - 32 mEq/L Final  . Glucose, Bld 06/21/2013 92  70 - 99 mg/dL Final  . BUN 06/21/2013 37* 6 - 23 mg/dL Final  . Creatinine, Ser 06/21/2013 4.63* 0.50 - 1.10 mg/dL Final  . Calcium 06/21/2013 9.1  8.4 - 10.5 mg/dL Final  . GFR calc non Af Amer 06/21/2013 9* >90  mL/min Final  . GFR calc Af Amer 06/21/2013 11* >90 mL/min Final   Comment: (NOTE)                          The eGFR has been calculated using the CKD EPI equation.                          This calculation has not been validated in all clinical situations.                          eGFR's persistently <90 mL/min signify possible Chronic Kidney                          Disease.  . Sodium 06/22/2013 139  135 - 145 mEq/L Final  . Potassium 06/22/2013 3.9  3.5 - 5.1 mEq/L Final  . Chloride 06/22/2013 106  96 - 112 mEq/L Final  .  CO2 06/22/2013 22  19 - 32 mEq/L Final  . Glucose, Bld 06/22/2013 92  70 - 99 mg/dL Final  . BUN 06/22/2013 39* 6 - 23 mg/dL Final  . Creatinine, Ser 06/22/2013 5.00* 0.50 - 1.10 mg/dL Final  . Calcium 06/22/2013 9.2  8.4 - 10.5 mg/dL Final  . GFR calc non Af Amer 06/22/2013 8* >90 mL/min Final  . GFR calc Af Amer 06/22/2013 10* >90 mL/min Final   Comment: (NOTE)                          The eGFR has been calculated using the CKD EPI equation.                          This calculation has not been validated in all clinical situations.                          eGFR's persistently <90 mL/min signify possible Chronic Kidney                          Disease.  . WBC 06/22/2013 6.7  4.0 - 10.5 K/uL Final  . RBC 06/22/2013 3.23* 3.87 - 5.11 MIL/uL Final  . Hemoglobin 06/22/2013 7.9* 12.0 - 15.0 g/dL Final  . HCT 06/22/2013 24.6* 36.0 - 46.0 % Final  . MCV 06/22/2013 76.2* 78.0 - 100.0 fL Final  . MCH 06/22/2013 24.5* 26.0 - 34.0 pg Final  . MCHC 06/22/2013 32.1  30.0 - 36.0 g/dL Final  . RDW 06/22/2013 20.0* 11.5 - 15.5 % Final  . Platelets 06/22/2013 215  150 - 400 K/uL Final   Comment: SPECIMEN CHECKED FOR CLOTS                          PLATELET COUNT CONFIRMED BY SMEAR  . Glucose-Capillary 06/21/2013 132* 70 - 99 mg/dL Final  . Comment 1 06/21/2013 Notify RN   Final  . Phosphorus 06/22/2013 4.0  2.3 - 4.6 mg/dL Final  Admission on 06/08/2013, Discharged on  06/12/2013  Component Date Value Range Status  . ABO/RH(D) 06/08/2013 B POS   Final  . Antibody Screen 06/08/2013 NEG   Final  . Sample Expiration 06/08/2013 06/11/2013   Final  . Unit Number 06/08/2013 [id number]   Final  . Blood Component Type 06/08/2013 RED CELLS,LR   Final  . Unit division 06/08/2013 00   Final  . Status of Unit 06/08/2013 ISSUED,FINAL   Final  . Transfusion Status 06/08/2013 OK TO TRANSFUSE   Final  . Crossmatch Result 06/08/2013 Compatible   Final  . Unit Number 06/08/2013 [id number]   Final  . Blood Component Type 06/08/2013 RED CELLS,LR   Final  . Unit division 06/08/2013 00   Final  . Status of Unit 06/08/2013 ISSUED,FINAL   Final  . Transfusion Status 06/08/2013 OK TO TRANSFUSE   Final  . Crossmatch Result 06/08/2013 Compatible   Final  . WBC 06/08/2013 5.4  4.0 - 10.5 K/uL Final  . RBC 06/08/2013 2.66* 3.87 - 5.11 MIL/uL Final  . Hemoglobin 06/08/2013 6.1* 12.0 - 15.0 g/dL Final   Comment: RESULT REPEATED AND VERIFIED                          CRITICAL RESULT CALLED TO, READ BACK BY AND  VERIFIED WITH:                          A. ROGERS AT 1849 ON 06/08/13 BY S. VANHOORNE  . HCT 06/08/2013 19.6* 36.0 - 46.0 % Final  . MCV 06/08/2013 73.7* 78.0 - 100.0 fL Final  . MCH 06/08/2013 22.9* 26.0 - 34.0 pg Final  . MCHC 06/08/2013 31.1  30.0 - 36.0 g/dL Final  . RDW 06/08/2013 19.7* 11.5 - 15.5 % Final  . Platelets 06/08/2013 181  150 - 400 K/uL Final  . Sodium 06/08/2013 139  135 - 145 mEq/L Final  . Potassium 06/08/2013 3.2* 3.5 - 5.1 mEq/L Final  . Chloride 06/08/2013 105  96 - 112 mEq/L Final  . CO2 06/08/2013 24  19 - 32 mEq/L Final  . Glucose, Bld 06/08/2013 96  70 - 99 mg/dL Final  . BUN 06/08/2013 39* 6 - 23 mg/dL Final  . Creatinine, Ser 06/08/2013 5.31* 0.50 - 1.10 mg/dL Final  . Calcium 06/08/2013 9.3  8.4 - 10.5 mg/dL Final  . GFR calc non Af Amer 06/08/2013 8* >90 mL/min Final  . GFR calc Af Amer 06/08/2013 9* >90 mL/min Final   Comment:  (NOTE)                          The eGFR has been calculated using the CKD EPI equation.                          This calculation has not been validated in all clinical situations.                          eGFR's persistently <90 mL/min signify possible Chronic Kidney                          Disease.  . Vitamin B-12 06/08/2013 481  211 - 911 pg/mL Final   Performed at [facility]  . Folate 06/08/2013 5.7   Final   Comment: (NOTE)                          Reference Ranges                                 Deficient:       0.4 - 3.3 ng/mL                                 Indeterminate:   3.4 - 5.4 ng/mL                                 Normal:              > 5.4 ng/mL                          Performed at [facility]  . Iron 06/08/2013 22* 42 - 135 ug/dL Final  . TIBC 06/08/2013 145* 250 - 470 ug/dL Final  . Saturation Ratios 06/08/2013 15* 20 - 55 % Final  . UIBC 06/08/2013 123* 125 - 400  ug/dL Final   Performed at [facility]  . Ferritin 06/08/2013 436* 10 - 291 ng/mL Final   Performed at [facility]  . Retic Ct Pct 06/08/2013 2.0  0.4 - 3.1 % Final  . RBC. 06/08/2013 2.66* 3.87 - 5.11 MIL/uL Final  . Retic Count, Manual 06/08/2013 53.2  19.0 - 186.0 K/uL Final  . TSH 06/08/2013 2.236  0.350 - 4.500 uIU/mL Final   Performed at [facility]  . Fecal Occult Bld 06/09/2013 NEGATIVE* NEGATIVE Final  . Sodium 06/09/2013 139  135 - 145 mEq/L Final  . Potassium 06/09/2013 3.6  3.5 - 5.1 mEq/L Final  . Chloride 06/09/2013 108  96 - 112 mEq/L Final  . CO2 06/09/2013 20  19 - 32 mEq/L Final  . Glucose, Bld 06/09/2013 104* 70 - 99 mg/dL Final  . BUN 06/09/2013 37* 6 - 23 mg/dL Final  . Creatinine, Ser 06/09/2013 4.84* 0.50 - 1.10 mg/dL Final  . Calcium 06/09/2013 9.2  8.4 - 10.5 mg/dL Final  . GFR calc non Af Amer 06/09/2013 9* >90 mL/min Final  . GFR calc Af Amer 06/09/2013 10* >90 mL/min Final   Comment: (NOTE)                          The eGFR  has been calculated using the CKD EPI equation.                          This calculation has not been validated in all clinical situations.                          eGFR's persistently <90 mL/min signify possible Chronic Kidney                          Disease.  . WBC 06/09/2013 7.2  4.0 - 10.5 K/uL Final  . RBC 06/09/2013 3.50* 3.87 - 5.11 MIL/uL Final  . Hemoglobin 06/09/2013 8.7* 12.0 - 15.0 g/dL Final   DELTA CHECK NOTED  . HCT 06/09/2013 27.0* 36.0 - 46.0 % Final  . MCV 06/09/2013 77.1* 78.0 - 100.0 fL Final  . MCH 06/09/2013 24.9* 26.0 - 34.0 pg Final  . MCHC 06/09/2013 32.2  30.0 - 36.0 g/dL Final  . RDW 06/09/2013 18.8* 11.5 - 15.5 % Final  . Platelets 06/09/2013 209  150 - 400 K/uL Final   Comment: SPECIMEN CHECKED FOR CLOTS                          PLATELET COUNT CONFIRMED BY SMEAR  . Color, Urine 06/08/2013 YELLOW  YELLOW Final  . APPearance 06/08/2013 CLEAR  CLEAR Final  . Specific Gravity, Urine 06/08/2013 1.015  1.005 - 1.030 Final  . pH 06/08/2013 6.0  5.0 - 8.0 Final  . Glucose, UA 06/08/2013 NEGATIVE  NEGATIVE mg/dL Final  . Hgb urine dipstick 06/08/2013 SMALL* NEGATIVE Final  . Bilirubin Urine 06/08/2013 NEGATIVE  NEGATIVE Final  . Ketones, ur 06/08/2013 NEGATIVE  NEGATIVE mg/dL Final  . Protein, ur 06/08/2013 100* NEGATIVE mg/dL Final  . Urobilinogen, UA 06/08/2013 0.2  0.0 - 1.0 mg/dL Final  . Nitrite 06/08/2013 NEGATIVE  NEGATIVE Final  . Leukocytes, UA 06/08/2013 NEGATIVE  NEGATIVE Final  . Order Confirmation 06/08/2013 ORDER PROCESSED BY BLOOD BANK   Final  . Squamous Epithelial /  LPF 06/08/2013 FEW* RARE Final  . WBC, UA 06/08/2013 0-2  <3 WBC/hpf Final  . RBC / HPF 06/08/2013 0-2  <3 RBC/hpf Final  . Bacteria, UA 06/08/2013 RARE  RARE Final  . PTH 06/09/2013 473.2* 14.0 - 72.0 pg/mL Final  . Calcium, Total (PTH) 06/09/2013 8.6  8.4 - 10.5 mg/dL Final   Performed at [facility]  . Sodium 06/10/2013 136  135 - 145 mEq/L Final  . Potassium  06/10/2013 3.5  3.5 - 5.1 mEq/L Final  . Chloride 06/10/2013 106  96 - 112 mEq/L Final  . CO2 06/10/2013 19  19 - 32 mEq/L Final  . Glucose, Bld 06/10/2013 97  70 - 99 mg/dL Final  . BUN 06/10/2013 30* 6 - 23 mg/dL Final  . Creatinine, Ser 06/10/2013 4.24* 0.50 - 1.10 mg/dL Final  . Calcium 06/10/2013 9.2  8.4 - 10.5 mg/dL Final  . GFR calc non Af Amer 06/10/2013 10* >90 mL/min Final  . GFR calc Af Amer 06/10/2013 12* >90 mL/min Final   Comment: (NOTE)                          The eGFR has been calculated using the CKD EPI equation.                          This calculation has not been validated in all clinical situations.                          eGFR's persistently <90 mL/min signify possible Chronic Kidney                          Disease.  . WBC 06/10/2013 6.7  4.0 - 10.5 K/uL Final  . RBC 06/10/2013 3.61* 3.87 - 5.11 MIL/uL Final  . Hemoglobin 06/10/2013 9.0* 12.0 - 15.0 g/dL Final  . HCT 06/10/2013 27.7* 36.0 - 46.0 % Final  . MCV 06/10/2013 76.7* 78.0 - 100.0 fL Final  . MCH 06/10/2013 24.9* 26.0 - 34.0 pg Final  . MCHC 06/10/2013 32.5  30.0 - 36.0 g/dL Final  . RDW 06/10/2013 19.2* 11.5 - 15.5 % Final  . Platelets 06/10/2013 PLATELET CLUMPS NOTED ON SMEAR, COUNT APPEARS ADEQUATE  150 - 400 K/uL Final   SMEAR STAINED AND AVAILABLE FOR REVIEW  . Phosphorus 06/10/2013 3.7  2.3 - 4.6 mg/dL Final  . Sodium 06/11/2013 140  135 - 145 mEq/L Final  . Potassium 06/11/2013 3.3* 3.5 - 5.1 mEq/L Final  . Chloride 06/11/2013 111  96 - 112 mEq/L Final  . CO2 06/11/2013 18* 19 - 32 mEq/L Final  . Glucose, Bld 06/11/2013 90  70 - 99 mg/dL Final  . BUN 06/11/2013 29* 6 - 23 mg/dL Final  . Creatinine, Ser 06/11/2013 4.27* 0.50 - 1.10 mg/dL Final  . Calcium 06/11/2013 8.6  8.4 - 10.5 mg/dL Final  . GFR calc non Af Amer 06/11/2013 10* >90 mL/min Final  . GFR calc Af Amer 06/11/2013 12* >90 mL/min Final   Comment: (NOTE)                          The eGFR has been calculated using the CKD EPI  equation.  This calculation has not been validated in all clinical situations.                          eGFR's persistently <90 mL/min signify possible Chronic Kidney                          Disease.  . Sodium 06/12/2013 138  135 - 145 mEq/L Final  . Potassium 06/12/2013 3.7  3.5 - 5.1 mEq/L Final  . Chloride 06/12/2013 110  96 - 112 mEq/L Final  . CO2 06/12/2013 18* 19 - 32 mEq/L Final  . Glucose, Bld 06/12/2013 88  70 - 99 mg/dL Final  . BUN 06/12/2013 28* 6 - 23 mg/dL Final  . Creatinine, Ser 06/12/2013 4.51* 0.50 - 1.10 mg/dL Final  . Calcium 06/12/2013 8.8  8.4 - 10.5 mg/dL Final  . GFR calc non Af Amer 06/12/2013 9* >90 mL/min Final  . GFR calc Af Amer 06/12/2013 11* >90 mL/min Final   Comment: (NOTE)                          The eGFR has been calculated using the CKD EPI equation.                          This calculation has not been validated in all clinical situations.                          eGFR's persistently <90 mL/min signify possible Chronic Kidney                          Disease.    Urinalysis    Component Value Date/Time   COLORURINE YELLOW 06/20/2013 1455   APPEARANCEUR CLEAR 06/20/2013 1455   LABSPEC 1.025 06/20/2013 1455   PHURINE 6.0 06/20/2013 1455   GLUCOSEU NEGATIVE 06/20/2013 1455   HGBUR SMALL* 06/20/2013 1455   BILIRUBINUR NEGATIVE 06/20/2013 1455   KETONESUR NEGATIVE 06/20/2013 1455   PROTEINUR >300* 06/20/2013 1455   UROBILINOGEN 0.2 06/20/2013 1455   NITRITE NEGATIVE 06/20/2013 1455   LEUKOCYTESUR NEGATIVE 06/20/2013 1455      @RADIOGRAPHY : US Renal Status: Final result         PACS Images    Show images for US Renal         Study Result    CLINICAL DATA: Acute on chronic renal failure  EXAM:  RENAL/URINARY TRACT ULTRASOUND COMPLETE  COMPARISON: None.  FINDINGS:  Right Kidney  Length: 10.4 cm. The renal cortex is increased in echogenicity.  There are to anechoic areas within the upper pole  measuring 9 x 8 x  9 mm and 1.3 x 1.1 x 1.4 cm with increased through transmission most  consistent with tiny cysts.  Left Kidney  Length: 12.9 cm. The renal cortex is increased in echogenicity.  There are two anechoic areas within the left kidney. There is an  anechoic area in the upper pole measuring 1.8 x 1.5 x 1.7 cm most  consistent with a cyst. There is a 1.2 x 1.2 x 1.2 cm anechoic area  in the left midpole likely reflecting a cyst.  Bladder  Appears normal for degree of bladder distention.  IMPRESSION:  1. No obstructive uropathy.  2. Increased renal cortical echogenicity bilaterally as can be seen  with medical renal disease.  3. Small bilateral renal cysts.  Electronically Signed  By: Kathreen Devoid  On: 06/09/2013      PATHOLOGY: Peripheral smear reveals normocytic normochromic red cells.   IMPRESSION:  #1. 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 #2. Secondary hyperparathyroidism. #3. Chronic renal disease, stage III, for followup with nephrology in 10 days. #4. Hypertension, controlled.   PLAN:  #1. No further workup will be performed in this office. #2. Appointment with nephrology in 10 days with lab work ordered by nephrology in 7 days. Patient will be a candidate for Aranesp treatment which can be rendered by the nephrology service.  #3. No followup in this office.  I appreciate the opportunity of sharing in her care.   Doroteo Bradford, MD 07/09/2013 9:10 AM

## 2013-07-09 NOTE — Patient Instructions (Signed)
[facility] Discharge Instructions  RECOMMENDATIONS MADE BY THE CONSULTANT AND ANY TEST RESULTS WILL BE SENT TO YOUR REFERRING PHYSICIAN.  EXAM FINDINGS BY THE PHYSICIAN TODAY AND SIGNS OR SYMPTOMS TO REPORT TO CLINIC OR PRIMARY PHYSICIAN:   Since you are being seen by the kidney doctor next week and you have labs ordered by kidney doctor, we will not do anything here today.    You are discharged from our clinic!    Thank you for choosing [facility] to provide your oncology and hematology care.  To afford each patient quality time with our providers, please arrive at least 15 minutes before your scheduled appointment time.  With your help, our goal is to use those 15 minutes to complete the necessary work-up to ensure our physicians have the information they need to help with your evaluation and healthcare recommendations.    Effective January 1st, 2014, we ask that you re-schedule your appointment with our physicians should you arrive 10 or more minutes late for your appointment.  We strive to give you quality time with our providers, and arriving late affects you and other patients whose appointments are after yours.    Again, thank you for choosing [facility].  Our hope is that these requests will decrease the amount of time that you wait before being seen by our physicians.       _____________________________________________________________  Should you have questions after your visit to [facility], please contact our office at [phone] between the hours of 8:30 a.m. and 5:00 p.m.  Voicemails left after 4:30 p.m. will not be returned until the following business day.  For prescription refill requests, have your pharmacy contact our office with your prescription refill request.

## 2013-07-31 ENCOUNTER — Other Ambulatory Visit: Payer: Self-pay | Admitting: *Deleted

## 2013-07-31 DIAGNOSIS — N185 Chronic kidney disease, stage 5: Secondary | ICD-10-CM

## 2013-07-31 DIAGNOSIS — Z01810 Encounter for preprocedural cardiovascular examination: Secondary | ICD-10-CM

## 2013-08-18 ENCOUNTER — Encounter (HOSPITAL_COMMUNITY): Payer: Self-pay | Admitting: Emergency Medicine

## 2013-08-18 ENCOUNTER — Observation Stay (HOSPITAL_COMMUNITY)
Admission: EM | Admit: 2013-08-18 | Discharge: 2013-08-20 | Disposition: A | Discharge status: Home / Self Care | Payer: BC Managed Care – PPO | Attending: Internal Medicine | Admitting: Internal Medicine

## 2013-08-18 ENCOUNTER — Emergency Department (HOSPITAL_COMMUNITY): Payer: BC Managed Care – PPO

## 2013-08-18 DIAGNOSIS — N039 Chronic nephritic syndrome with unspecified morphologic changes: Principal | ICD-10-CM

## 2013-08-18 DIAGNOSIS — R42 Dizziness and giddiness: Secondary | ICD-10-CM

## 2013-08-18 DIAGNOSIS — I160 Hypertensive urgency: Secondary | ICD-10-CM

## 2013-08-18 DIAGNOSIS — N183 Chronic kidney disease, stage 3 unspecified: Secondary | ICD-10-CM

## 2013-08-18 DIAGNOSIS — D649 Anemia, unspecified: Secondary | ICD-10-CM | POA: Diagnosis present

## 2013-08-18 DIAGNOSIS — N289 Disorder of kidney and ureter, unspecified: Secondary | ICD-10-CM

## 2013-08-18 DIAGNOSIS — Z1211 Encounter for screening for malignant neoplasm of colon: Secondary | ICD-10-CM

## 2013-08-18 DIAGNOSIS — D631 Anemia in chronic kidney disease: Principal | ICD-10-CM | POA: Insufficient documentation

## 2013-08-18 DIAGNOSIS — E876 Hypokalemia: Secondary | ICD-10-CM

## 2013-08-18 DIAGNOSIS — N189 Chronic kidney disease, unspecified: Secondary | ICD-10-CM | POA: Insufficient documentation

## 2013-08-18 DIAGNOSIS — R1110 Vomiting, unspecified: Secondary | ICD-10-CM

## 2013-08-18 DIAGNOSIS — IMO0001 Reserved for inherently not codable concepts without codable children: Secondary | ICD-10-CM

## 2013-08-18 DIAGNOSIS — R198 Other specified symptoms and signs involving the digestive system and abdomen: Secondary | ICD-10-CM

## 2013-08-18 DIAGNOSIS — N2581 Secondary hyperparathyroidism of renal origin: Secondary | ICD-10-CM

## 2013-08-18 DIAGNOSIS — M109 Gout, unspecified: Secondary | ICD-10-CM

## 2013-08-18 DIAGNOSIS — K5900 Constipation, unspecified: Secondary | ICD-10-CM

## 2013-08-18 DIAGNOSIS — R1319 Other dysphagia: Secondary | ICD-10-CM

## 2013-08-18 DIAGNOSIS — K2970 Gastritis, unspecified, without bleeding: Secondary | ICD-10-CM

## 2013-08-18 DIAGNOSIS — I10 Essential (primary) hypertension: Secondary | ICD-10-CM

## 2013-08-18 HISTORY — DX: Dysphagia, unspecified: R13.10

## 2013-08-18 HISTORY — DX: Secondary hyperparathyroidism of renal origin: N25.81

## 2013-08-18 HISTORY — DX: Chronic kidney disease, unspecified: N18.9

## 2013-08-18 HISTORY — DX: Anemia, unspecified: D64.9

## 2013-08-18 LAB — BASIC METABOLIC PANEL
BUN: 31 mg/dL — ABNORMAL HIGH (ref 6–23)
BUN: 49 mg/dL — ABNORMAL HIGH (ref 6–23)
CALCIUM: 9.5 mg/dL (ref 8.4–10.5)
CHLORIDE: 99 meq/L (ref 96–112)
CHLORIDE: 106 meq/L (ref 96–112)
CO2: 28 mEq/L (ref 19–32)
CO2: 21 mEq/L (ref 19–32)
CREATININE: 5.09 mg/dL — AB (ref 0.50–1.10)
Calcium: 9.2 mg/dL (ref 8.4–10.5)
Creatinine, Ser: 1.44 mg/dL — ABNORMAL HIGH (ref 0.50–1.10)
GFR calc non Af Amer: 37 mL/min — ABNORMAL LOW (ref >90)
GFR calc non Af Amer: 8 mL/min — ABNORMAL LOW (ref >90)
GFR, EST AFRICAN AMERICAN: 43 mL/min — AB (ref >90)
GFR, EST AFRICAN AMERICAN: 9 mL/min — AB (ref >90)
Glucose, Bld: 97 mg/dL (ref 70–99)
Glucose, Bld: 95 mg/dL (ref 70–99)
POTASSIUM: 3.8 meq/L (ref 3.7–5.3)
Potassium: 3.9 mEq/L (ref 3.7–5.3)
Sodium: 141 mEq/L (ref 137–147)
Sodium: 141 mEq/L (ref 137–147)

## 2013-08-18 LAB — URINALYSIS W MICROSCOPIC + REFLEX CULTURE
Bilirubin Urine: NEGATIVE
Glucose, UA: NEGATIVE mg/dL
KETONES UR: NEGATIVE mg/dL
Leukocytes, UA: NEGATIVE
NITRITE: NEGATIVE
PH: 6 (ref 5.0–8.0)
Protein, ur: >300 mg/dL — AB
SPECIFIC GRAVITY, URINE: 1.02 (ref 1.005–1.030)
Urobilinogen, UA: 0.2 mg/dL (ref 0.0–1.0)

## 2013-08-18 LAB — CBC WITH DIFFERENTIAL/PLATELET
BASOS ABS: 0.1 10*3/uL (ref 0.0–0.1)
Basophils Absolute: 0 10*3/uL (ref 0.0–0.1)
Basophils Relative: 1 % (ref 0–1)
Basophils Relative: 0 % (ref 0–1)
Eosinophils Absolute: 0.4 10*3/uL (ref 0.0–0.7)
Eosinophils Absolute: 0.2 10*3/uL (ref 0.0–0.7)
Eosinophils Relative: 6 % — ABNORMAL HIGH (ref 0–5)
Eosinophils Relative: 4 % (ref 0–5)
HEMATOCRIT: 35.3 % — AB (ref 36.0–46.0)
HEMATOCRIT: 21.8 % — AB (ref 36.0–46.0)
HEMOGLOBIN: 11.6 g/dL — AB (ref 12.0–15.0)
Hemoglobin: 7.2 g/dL — ABNORMAL LOW (ref 12.0–15.0)
LYMPHS PCT: 29 % (ref 12–46)
Lymphocytes Relative: 23 % (ref 12–46)
Lymphs Abs: 1.9 10*3/uL (ref 0.7–4.0)
Lymphs Abs: 1.6 10*3/uL (ref 0.7–4.0)
MCH: 29.7 pg (ref 26.0–34.0)
MCH: 24.4 pg — ABNORMAL LOW (ref 26.0–34.0)
MCHC: 32.9 g/dL (ref 30.0–36.0)
MCHC: 33 g/dL (ref 30.0–36.0)
MCV: 90.3 fL (ref 78.0–100.0)
MCV: 73.9 fL — ABNORMAL LOW (ref 78.0–100.0)
MONO ABS: 0.7 10*3/uL (ref 0.1–1.0)
Monocytes Absolute: 0.8 10*3/uL (ref 0.1–1.0)
Monocytes Relative: 12 % (ref 3–12)
Monocytes Relative: 10 % (ref 3–12)
NEUTROS ABS: 3.4 10*3/uL (ref 1.7–7.7)
Neutro Abs: 4.3 10*3/uL (ref 1.7–7.7)
Neutrophils Relative %: 52 % (ref 43–77)
Neutrophils Relative %: 63 % (ref 43–77)
Platelets: 304 10*3/uL (ref 150–400)
Platelets: 221 10*3/uL (ref 150–400)
RBC: 3.91 MIL/uL (ref 3.87–5.11)
RBC: 2.95 MIL/uL — ABNORMAL LOW (ref 3.87–5.11)
RDW: 13.6 % (ref 11.5–15.5)
RDW: 20.3 % — AB (ref 11.5–15.5)
WBC: 6.4 10*3/uL (ref 4.0–10.5)
WBC: 6.9 10*3/uL (ref 4.0–10.5)

## 2013-08-18 LAB — TROPONIN I: Troponin I: <0.3 ng/mL (ref <0.30)

## 2013-08-18 LAB — PREPARE RBC (CROSSMATCH)

## 2013-08-18 MED ORDER — NIFEDIPINE ER OSMOTIC RELEASE 30 MG PO TB24
90.0000 mg | ORAL_TABLET | Freq: Every day | ORAL | Status: DC
  Administered 2013-08-19 – 2013-08-20 (×2): 90 mg via ORAL
  Filled 2013-08-18 (×2): qty 3

## 2013-08-18 MED ORDER — LABETALOL HCL 200 MG PO TABS
300.0000 mg | ORAL_TABLET | Freq: Three times a day (TID) | ORAL | Status: DC
  Administered 2013-08-18 – 2013-08-20 (×5): 300 mg via ORAL
  Filled 2013-08-18 (×5): qty 2

## 2013-08-18 MED ORDER — ASPIRIN EC 81 MG PO TBEC
81.0000 mg | DELAYED_RELEASE_TABLET | Freq: Every day | ORAL | Status: DC
  Administered 2013-08-19 – 2013-08-20 (×2): 81 mg via ORAL
  Filled 2013-08-18 (×2): qty 1

## 2013-08-18 MED ORDER — SODIUM BICARBONATE 650 MG PO TABS
650.0000 mg | ORAL_TABLET | Freq: Two times a day (BID) | ORAL | Status: DC
  Administered 2013-08-18 – 2013-08-20 (×4): 650 mg via ORAL
  Filled 2013-08-18 (×4): qty 1

## 2013-08-18 MED ORDER — PANTOPRAZOLE SODIUM 40 MG IV SOLR
40.0000 mg | INTRAVENOUS | Status: DC
  Administered 2013-08-18 – 2013-08-19 (×2): 40 mg via INTRAVENOUS
  Filled 2013-08-18 (×2): qty 40

## 2013-08-18 MED ORDER — SODIUM CHLORIDE 0.9 % IJ SOLN
3.0000 mL | Freq: Two times a day (BID) | INTRAMUSCULAR | Status: DC
  Administered 2013-08-19 (×2): 3 mL via INTRAVENOUS

## 2013-08-18 MED ORDER — DOCUSATE SODIUM 100 MG PO CAPS
100.0000 mg | ORAL_CAPSULE | Freq: Two times a day (BID) | ORAL | Status: DC
  Administered 2013-08-18 – 2013-08-20 (×4): 100 mg via ORAL
  Filled 2013-08-18 (×4): qty 1

## 2013-08-18 MED ORDER — SODIUM CHLORIDE 0.9 % IV SOLN: 250.0000 mL | INTRAVENOUS | Status: DC | PRN

## 2013-08-18 MED ORDER — MECLIZINE HCL 12.5 MG PO TABS: 25.0000 mg | ORAL_TABLET | Freq: Three times a day (TID) | ORAL | Status: DC | PRN

## 2013-08-18 MED ORDER — HYDROCODONE-ACETAMINOPHEN 5-325 MG PO TABS: 1.0000 | ORAL_TABLET | ORAL | Status: DC | PRN

## 2013-08-18 MED ORDER — SODIUM CHLORIDE 0.9 % IJ SOLN: 3.0000 mL | INTRAMUSCULAR | Status: DC | PRN

## 2013-08-18 MED ORDER — ONDANSETRON HCL 4 MG/2ML IJ SOLN
4.0000 mg | Freq: Four times a day (QID) | INTRAMUSCULAR | Status: DC | PRN
  Filled 2013-08-18: qty 2

## 2013-08-18 MED ORDER — HEPARIN SODIUM (PORCINE) 5000 UNIT/ML IJ SOLN
5000.0000 [IU] | Freq: Three times a day (TID) | INTRAMUSCULAR | Status: DC
  Administered 2013-08-18 – 2013-08-19 (×2): 5000 [IU] via SUBCUTANEOUS
  Filled 2013-08-18 (×2): qty 1

## 2013-08-18 MED ORDER — LEVOTHYROXINE SODIUM 50 MCG PO TABS
50.0000 ug | ORAL_TABLET | Freq: Every day | ORAL | Status: DC
  Administered 2013-08-19 – 2013-08-20 (×2): 50 ug via ORAL
  Filled 2013-08-18 (×2): qty 1

## 2013-08-18 MED ORDER — ALLOPURINOL 100 MG PO TABS
100.0000 mg | ORAL_TABLET | Freq: Every day | ORAL | Status: DC
  Administered 2013-08-19 – 2013-08-20 (×2): 100 mg via ORAL
  Filled 2013-08-18 (×2): qty 1

## 2013-08-18 MED ORDER — ONDANSETRON HCL 4 MG PO TABS: 4.0000 mg | ORAL_TABLET | Freq: Four times a day (QID) | ORAL | Status: DC | PRN

## 2013-08-18 MED ORDER — HYDRALAZINE HCL 25 MG PO TABS
50.0000 mg | ORAL_TABLET | Freq: Three times a day (TID) | ORAL | Status: DC
  Administered 2013-08-18 – 2013-08-20 (×5): 50 mg via ORAL
  Filled 2013-08-18 (×5): qty 2

## 2013-08-18 NOTE — ED Notes (Addendum)
Pt reported that her HGB was 5 by her kidney doctor, states that she has stopped her shots for her blood and has kidney failure and soon to start diaylsis, denies SOB, c/o gen. Weakness x 1 month and gets dizzy at times

## 2013-08-18 NOTE — ED Notes (Signed)
States she was advised to come in for a blood transfusion per her physician

## 2013-08-18 NOTE — H&P (Signed)
PCP:   [name], MD   Chief Complaint:  Weakness  HPI: 69 your old female who   has a past medical history of HTN (hypertension); Hypothyroidism; Uterine cancer (2012); Anemia; CKD (chronic kidney disease); Dysphagia; and Hyperparathyroidism due to renal insufficiency. Today presented to the ED for blood transfusion. Patient was seen at the nephrology clinic today and was told that her hemoglobin was around 5 and that she needed to go to ED for blood transfusion. Patient has been complaining of generalized weakness and dizziness but denies passing out no blurred vision no chest pain no shortness of breath no nausea vomiting or diarrhea. Patient denies black colored stools or vomiting blood. Patient underwent EGD in November 2014 which did not show significant source for bleeding. Patient's anemia is most likely due to anemia of chronic disease due to CKD. Patient was on Procrit but lately has not been getting Procrit and patient is soon to start dialysis.  Allergies:   Allergies  Allergen Reactions  . Cephalexin       Past Medical History  Diagnosis Date  . HTN (hypertension)   . Hypothyroidism   . Uterine cancer 2012  . Anemia   . CKD (chronic kidney disease)   . Dysphagia   . Hyperparathyroidism due to renal insufficiency     Past Surgical History  Procedure Laterality Date  . Cholecystectomy    . Cesarean section    . Laparoscopic total hysterectomy    . Colonoscopy  2000    [facility], [facility]  . Colonoscopy  2003 NUR BRBPR D50 V6    [facility] [facility]  . Abdominal hysterectomy    . Nasal septum surgery    . Esophagogastroduodenoscopy (egd) with esophageal dilation N/A 06/08/2013    Procedure: ESOPHAGOGASTRODUODENOSCOPY (EGD) WITH ESOPHAGEAL DILATION;  Surgeon: Danie Binder, MD;  Location: [facility];  Service: Endoscopy;  Laterality: N/A;  12:30    Prior to Admission medications   Medication Sig Start Date End Date Taking? Authorizing Provider  allopurinol (ZYLOPRIM)  100 MG tablet Take 100 mg by mouth daily.   Yes Historical Provider, MD  aspirin EC 81 MG tablet Take 81 mg by mouth daily.   Yes Historical Provider, MD  docusate sodium (COLACE) 100 MG capsule Take 100 mg by mouth 2 (two) times daily.   Yes Historical Provider, MD  ferrous sulfate dried (SLOW FE) 160 (50 FE) MG TBCR Take 160 mg by mouth daily.     Yes Historical Provider, MD  hydrALAZINE (APRESOLINE) 50 MG tablet Take 1 tablet (50 mg total) by mouth every 8 (eight) hours. 06/12/13  Yes Kathie Dike, MD  labetalol (NORMODYNE) 300 MG tablet Take 1 tablet (300 mg total) by mouth 3 (three) times daily. 06/22/13  Yes Kathie Dike, MD  levothyroxine (SYNTHROID, LEVOTHROID) 50 MCG tablet Take 50 mcg by mouth daily.     Yes Historical Provider, MD  meclizine (ANTIVERT) 25 MG tablet Take 25 mg by mouth 3 (three) times daily as needed for dizziness.   Yes Historical Provider, MD  NIFEdipine (PROCARDIA XL/ADALAT-CC) 90 MG 24 hr tablet Take 1 tablet (90 mg total) by mouth daily. 06/22/13  Yes Kathie Dike, MD  omeprazole (PRILOSEC) 20 MG capsule Take 20 mg by mouth daily.   Yes Historical Provider, MD  sodium bicarbonate 650 MG tablet Take 1 tablet (650 mg total) by mouth 2 (two) times daily. 06/12/13  Yes Kathie Dike, MD  darbepoetin (ARANESP) 40 MCG/0.4ML SOLN injection Inject 0.4 mLs (40 mcg  total) into the skin every 7 (seven) days. 06/27/13   Kathie Dike, MD    Social History:  reports that she has never smoked. She does not have any smokeless tobacco history on file. She reports that she does not drink alcohol or use illicit drugs.  Family History  Problem Relation Age of Onset  . Colon cancer Neg Hx      All the positives are listed in BOLD  Review of Systems:  HEENT: Headache, blurred vision, runny nose, sore throat Neck: Hypothyroidism, hyperthyroidism,,lymphadenopathy Chest : Shortness of breath, history of COPD, Asthma Heart : Chest pain, history of coronary arterey  disease GI:  Nausea, vomiting, diarrhea, constipation, GERD GU: Dysuria, urgency, frequency of urination, hematuria Neuro: Stroke, seizures, syncope Psych: Depression, anxiety, hallucinations   Physical Exam: Blood pressure 178/64, pulse 95, temperature 98.2 F (36.8 C), temperature source Oral, resp. rate 18, height $RemoveBe'5\' 9"'[id number]$  (1.753 m), weight 81.647 kg (180 lb), SpO2 100.00%. Constitutional:   Patient is a well-developed and well-nourished female in no acute distress and cooperative with exam. Head: Normocephalic and atraumatic Mouth: Mucus membranes moist Eyes: PERRL, EOMI, conjunctivae normal Neck: Supple, No Thyromegaly Cardiovascular: RRR, S1 normal, S2 normal Pulmonary/Chest: CTAB, no wheezes, rales, or rhonchi Abdominal: Soft. Non-tender, non-distended, bowel sounds are normal, no masses, organomegaly, or guarding present.  Neurological: A&O x3, Strenght is normal and symmetric bilaterally, cranial nerve II-XII are grossly intact, no focal motor deficit, sensory intact to light touch bilaterally.  Extremities : No Cyanosis, Clubbing or Edema   Labs on Admission:  Results for orders placed during the hospital encounter of 08/18/13 (from the past 48 hour(s))  CBC WITH DIFFERENTIAL     Status: Abnormal   Collection Time    08/18/13  5:37 PM      Result Value Range   WBC 6.4  4.0 - 10.5 K/uL   Comment: PATIENT IDENTIFICATION ERROR. PLEASE DISREGARD RESULTS. ACCOUNT WILL BE CREDITED.   RBC 3.91  3.87 - 5.11 MIL/uL   Comment: PATIENT IDENTIFICATION ERROR. PLEASE DISREGARD RESULTS. ACCOUNT WILL BE CREDITED.   Hemoglobin 11.6 (*) 12.0 - 15.0 g/dL   Comment: PATIENT IDENTIFICATION ERROR. PLEASE DISREGARD RESULTS. ACCOUNT WILL BE CREDITED.   HCT 35.3 (*) 36.0 - 46.0 %   Comment: PATIENT IDENTIFICATION ERROR. PLEASE DISREGARD RESULTS. ACCOUNT WILL BE CREDITED.   MCV 90.3  78.0 - 100.0 fL   Comment: PATIENT IDENTIFICATION ERROR. PLEASE DISREGARD RESULTS. ACCOUNT WILL BE CREDITED.   MCH  29.7  26.0 - 34.0 pg   Comment: PATIENT IDENTIFICATION ERROR. PLEASE DISREGARD RESULTS. ACCOUNT WILL BE CREDITED.   MCHC 32.9  30.0 - 36.0 g/dL   Comment: PATIENT IDENTIFICATION ERROR. PLEASE DISREGARD RESULTS. ACCOUNT WILL BE CREDITED.   RDW 13.6  11.5 - 15.5 %   Comment: PATIENT IDENTIFICATION ERROR. PLEASE DISREGARD RESULTS. ACCOUNT WILL BE CREDITED.   Platelets 304  150 - 400 K/uL   Comment: PATIENT IDENTIFICATION ERROR. PLEASE DISREGARD RESULTS. ACCOUNT WILL BE CREDITED.   LUCs, %    0 - 4 %   Value: PATIENT IDENTIFICATION ERROR. PLEASE DISREGARD RESULTS. ACCOUNT WILL BE CREDITED.   LUC, Absolute    0.0 - 0.5 K/uL   Value: PATIENT IDENTIFICATION ERROR. PLEASE DISREGARD RESULTS. ACCOUNT WILL BE CREDITED.   Other       Value: PATIENT IDENTIFICATION ERROR. PLEASE DISREGARD RESULTS. ACCOUNT WILL BE CREDITED.   Other 2       Value: PATIENT IDENTIFICATION ERROR. PLEASE DISREGARD RESULTS. ACCOUNT WILL BE CREDITED.  Neutrophils Relative % 52  43 - 77 %   Comment: PATIENT IDENTIFICATION ERROR. PLEASE DISREGARD RESULTS. ACCOUNT WILL BE CREDITED.   Neutro Abs 3.4  1.7 - 7.7 K/uL   Comment: PATIENT IDENTIFICATION ERROR. PLEASE DISREGARD RESULTS. ACCOUNT WILL BE CREDITED.   Lymphocytes Relative 29  12 - 46 %   Comment: PATIENT IDENTIFICATION ERROR. PLEASE DISREGARD RESULTS. ACCOUNT WILL BE CREDITED.   Lymphs Abs 1.9  0.7 - 4.0 K/uL   Comment: PATIENT IDENTIFICATION ERROR. PLEASE DISREGARD RESULTS. ACCOUNT WILL BE CREDITED.   Monocytes Relative 12  3 - 12 %   Comment: PATIENT IDENTIFICATION ERROR. PLEASE DISREGARD RESULTS. ACCOUNT WILL BE CREDITED.   Monocytes Absolute 0.8  0.1 - 1.0 K/uL   Comment: PATIENT IDENTIFICATION ERROR. PLEASE DISREGARD RESULTS. ACCOUNT WILL BE CREDITED.   Eosinophils Relative 6 (*) 0 - 5 %   Comment: PATIENT IDENTIFICATION ERROR. PLEASE DISREGARD RESULTS. ACCOUNT WILL BE CREDITED.   Eosinophils Absolute 0.4  0.0 - 0.7 K/uL   Comment: PATIENT IDENTIFICATION ERROR.  PLEASE DISREGARD RESULTS. ACCOUNT WILL BE CREDITED.   Basophils Relative 1  0 - 1 %   Comment: PATIENT IDENTIFICATION ERROR. PLEASE DISREGARD RESULTS. ACCOUNT WILL BE CREDITED.   Basophils Absolute 0.1  0.0 - 0.1 K/uL   Comment: PATIENT IDENTIFICATION ERROR. PLEASE DISREGARD RESULTS. ACCOUNT WILL BE CREDITED.  BASIC METABOLIC PANEL     Status: Abnormal   Collection Time    08/18/13  5:37 PM      Result Value Range   Sodium 141  137 - 147 mEq/L   Comment: PATIENT IDENTIFICATION ERROR. PLEASE DISREGARD RESULTS. ACCOUNT WILL BE CREDITED.   Potassium 3.8  3.7 - 5.3 mEq/L   Comment: PATIENT IDENTIFICATION ERROR. PLEASE DISREGARD RESULTS. ACCOUNT WILL BE CREDITED.   Chloride 99  96 - 112 mEq/L   Comment: PATIENT IDENTIFICATION ERROR. PLEASE DISREGARD RESULTS. ACCOUNT WILL BE CREDITED.   CO2 28  19 - 32 mEq/L   Comment: PATIENT IDENTIFICATION ERROR. PLEASE DISREGARD RESULTS. ACCOUNT WILL BE CREDITED.   Glucose, Bld 97  70 - 99 mg/dL   Comment: PATIENT IDENTIFICATION ERROR. PLEASE DISREGARD RESULTS. ACCOUNT WILL BE CREDITED.   BUN 31 (*) 6 - 23 mg/dL   Comment: PATIENT IDENTIFICATION ERROR. PLEASE DISREGARD RESULTS. ACCOUNT WILL BE CREDITED.   Creatinine, Ser 1.44 (*) 0.50 - 1.10 mg/dL   Comment: PATIENT IDENTIFICATION ERROR. PLEASE DISREGARD RESULTS. ACCOUNT WILL BE CREDITED.   Calcium 9.2  8.4 - 10.5 mg/dL   Comment: PATIENT IDENTIFICATION ERROR. PLEASE DISREGARD RESULTS. ACCOUNT WILL BE CREDITED.   GFR calc non Af Amer 37 (*) >90 mL/min   GFR calc Af Amer 43 (*) >90 mL/min   Comment: (NOTE)     The eGFR has been calculated using the CKD EPI equation.     This calculation has not been validated in all clinical situations.     eGFR's persistently <90 mL/min signify possible Chronic Kidney     Disease.     CORRECTED ON 01/17 AT 1836: PREVIOUSLY REPORTED AS 41  URINALYSIS W MICROSCOPIC + REFLEX CULTURE     Status: Abnormal   Collection Time    08/18/13  6:23 PM      Result Value Range    Color, Urine YELLOW  YELLOW   APPearance CLEAR  CLEAR   Specific Gravity, Urine 1.020  1.005 - 1.030   pH 6.0  5.0 - 8.0   Glucose, UA NEGATIVE  NEGATIVE mg/dL   Hgb urine dipstick TRACE (*) NEGATIVE  Bilirubin Urine NEGATIVE  NEGATIVE   Ketones, ur NEGATIVE  NEGATIVE mg/dL   Protein, ur >300 (*) NEGATIVE mg/dL   Urobilinogen, UA 0.2  0.0 - 1.0 mg/dL   Nitrite NEGATIVE  NEGATIVE   Leukocytes, UA NEGATIVE  NEGATIVE   WBC, UA 0-2  <3 WBC/hpf   Bacteria, UA FEW (*) RARE  TROPONIN I     Status: None   Collection Time    08/18/13  6:38 PM      Result Value Range   Troponin I <0.30  <0.30 ng/mL   Comment:            Due to the release kinetics of cTnI,     a negative result within the first hours     of the onset of symptoms does not rule out     myocardial infarction with certainty.     If myocardial infarction is still suspected,     repeat the test at appropriate intervals.  CBC WITH DIFFERENTIAL     Status: Abnormal   Collection Time    08/18/13  6:38 PM      Result Value Range   WBC 6.9  4.0 - 10.5 K/uL   RBC 2.95 (*) 3.87 - 5.11 MIL/uL   Hemoglobin 7.2 (*) 12.0 - 15.0 g/dL   HCT 21.8 (*) 36.0 - 46.0 %   MCV 73.9 (*) 78.0 - 100.0 fL   MCH 24.4 (*) 26.0 - 34.0 pg   MCHC 33.0  30.0 - 36.0 g/dL   RDW 20.3 (*) 11.5 - 15.5 %   Platelets 221  150 - 400 K/uL   Neutrophils Relative % 63  43 - 77 %   Neutro Abs 4.3  1.7 - 7.7 K/uL   Lymphocytes Relative 23  12 - 46 %   Lymphs Abs 1.6  0.7 - 4.0 K/uL   Monocytes Relative 10  3 - 12 %   Monocytes Absolute 0.7  0.1 - 1.0 K/uL   Eosinophils Relative 4  0 - 5 %   Eosinophils Absolute 0.2  0.0 - 0.7 K/uL   Basophils Relative 0  0 - 1 %   Basophils Absolute 0.0  0.0 - 0.1 K/uL  BASIC METABOLIC PANEL     Status: Abnormal   Collection Time    08/18/13  6:38 PM      Result Value Range   Sodium 141  137 - 147 mEq/L   Potassium 3.9  3.7 - 5.3 mEq/L   Chloride 106  96 - 112 mEq/L   CO2 21  19 - 32 mEq/L   Glucose, Bld 95  70 - 99  mg/dL   BUN 49 (*) 6 - 23 mg/dL   Creatinine, Ser 5.09 (*) 0.50 - 1.10 mg/dL   Calcium 9.5  8.4 - 10.5 mg/dL   GFR calc non Af Amer 8 (*) >90 mL/min   GFR calc Af Amer 9 (*) >90 mL/min   Comment: (NOTE)     The eGFR has been calculated using the CKD EPI equation.     This calculation has not been validated in all clinical situations.     eGFR's persistently <90 mL/min signify possible Chronic Kidney     Disease.    Radiological Exams on Admission: Dg Chest 2 View  08/18/2013   CLINICAL DATA:  Anemia  EXAM: CHEST  2 VIEW  COMPARISON:  06/07/2013  FINDINGS: Mild bibasilar opacities, likely atelectasis. No focal consolidation. No pleural effusion or pneumothorax.  Heart is  top-normal in size.  Degenerative changes of the visualized thoracolumbar spine.  IMPRESSION: No evidence of acute cardiopulmonary disease.   Electronically Signed   By: Julian Hy M.D.   On: 08/18/2013 18:37    Assessment/Plan Active Problems:   Anemia in chronic kidney disease   Anemia   Chronic kidney disease   Symptomatic anemia  Symptomatic anemia Patient today presented with symptomatic anemia, hemoglobin is 7.2. Will transfuse 1 unit PRBC and check the CBC in the morning. We'll also obtain stool for occult blood.  Anemia of chronic disease Patient has anemia due to underlying C. KD. She has been on Procrit in the past but hasn't received over the past few days. She'll follow up with the nephrologist for further management.  C. KD stage IV Patient has advanced C. KD and is the process of starting hemodialysis.  DVT prophylaxis Heparin  Code status: Patient is full code, but does not want to be on long-term ventilator.   Family discussion: Discussed with patient's husband at bedside   Time Spent on Admission: 20 min  [facility] Pager: [phone] 08/18/2013, 8:02 PM  If 7PM-7AM, please contact night-coverage  [email or web address]  Password [email or web address]

## 2013-08-18 NOTE — ED Provider Notes (Signed)
CSN: [id number]     Arrival date & time 08/18/13  1523 History   First MD Initiated Contact with Patient 08/18/13 1651     Chief Complaint  Patient presents with  . Anemia    HPI Pt was seen at 1710. Per pt, c/o gradual onset and worsening of persistent generalized weakness and fatigue for the past 1 month, worse over the past 1 week. Has been associated with occasionally feeling "lightheaded."  Describes her symptoms as "like when my blood count gets low." Pt states she has hx anemia and was evaluated by her Renal MD 5 days ago for same and had labs drawn. States her Renal MD called today, told her "Hgb was 5," and to come to the hospital and "get admitted for a blood transfusion." Denies palpitations/CP, no SOB/cough, no abd pain, no N/V/D, no fevers, no black or blood in stools.    Past Medical History  Diagnosis Date  . HTN (hypertension)   . Hypothyroidism   . Uterine cancer 2012  . Anemia   . CKD (chronic kidney disease)   . Dysphagia   . Hyperparathyroidism due to renal insufficiency    Past Surgical History  Procedure Laterality Date  . Cholecystectomy    . Cesarean section    . Laparoscopic total hysterectomy    . Colonoscopy  2000    TICS, IH  . Colonoscopy  2003 NUR BRBPR D50 V6    DC/[place] TICS, IH  . Abdominal hysterectomy    . Nasal septum surgery    . Esophagogastroduodenoscopy (egd) with esophageal dilation N/A 06/08/2013    Procedure: ESOPHAGOGASTRODUODENOSCOPY (EGD) WITH ESOPHAGEAL DILATION;  Surgeon: Danie Binder, MD;  Location: [facility];  Service: Endoscopy;  Laterality: N/A;  12:30   Family History  Problem Relation Age of Onset  . Colon cancer Neg Hx    History  Substance Use Topics  . Smoking status: Never Smoker   . Smokeless tobacco: Not on file  . Alcohol Use: No    Review of Systems ROS: Statement: All systems negative except as marked or noted in the HPI; Constitutional: Negative for fever and chills. +generalized weakness/fatigue,  lightheadedness.. ; ; Eyes: Negative for eye pain, redness and discharge. ; ; ENMT: Negative for ear pain, hoarseness, nasal congestion, sinus pressure and sore throat. ; ; Cardiovascular: Negative for chest pain, palpitations, diaphoresis, dyspnea and peripheral edema. ; ; Respiratory: Negative for cough, wheezing and stridor. ; ; Gastrointestinal: Negative for nausea, vomiting, diarrhea, abdominal pain, blood in stool, hematemesis, jaundice and rectal bleeding. . ; ; Genitourinary: Negative for dysuria, flank pain and hematuria. ; ; Musculoskeletal: Negative for back pain and neck pain. Negative for swelling and trauma.; ; Skin: Negative for pruritus, rash, abrasions, blisters, bruising and skin lesion.; ; Neuro: Negative for headache and neck stiffness. Negative for altered level of consciousness , altered mental status, extremity weakness, paresthesias, involuntary movement, seizure and syncope.     Allergies  Cephalexin  Home Medications   Current Outpatient Rx  Name  Route  Sig  Dispense  Refill  . allopurinol (ZYLOPRIM) 100 MG tablet   Oral   Take 100 mg by mouth daily.         Marland Kitchen aspirin 81 MG tablet   Oral   Take 81 mg by mouth daily.           . darbepoetin (ARANESP) 40 MCG/0.4ML SOLN injection   Subcutaneous   Inject 0.4 mLs (40 mcg total) into the skin every  7 (seven) days.   8.4 mL      . docusate sodium (COLACE) 100 MG capsule   Oral   Take 100 mg by mouth 2 (two) times daily.         . ferrous sulfate dried (SLOW FE) 160 (50 FE) MG TBCR   Oral   Take 160 mg by mouth daily.           . hydrALAZINE (APRESOLINE) 50 MG tablet   Oral   Take 1 tablet (50 mg total) by mouth every 8 (eight) hours.   90 tablet   1   . labetalol (NORMODYNE) 300 MG tablet   Oral   Take 1 tablet (300 mg total) by mouth 3 (three) times daily.   90 tablet   1   . levothyroxine (SYNTHROID, LEVOTHROID) 50 MCG tablet   Oral   Take 50 mcg by mouth daily.           . meclizine  (ANTIVERT) 25 MG tablet   Oral   Take 25 mg by mouth 3 (three) times daily as needed for dizziness.         Marland Kitchen NIFEdipine (PROCARDIA XL/ADALAT-CC) 90 MG 24 hr tablet   Oral   Take 1 tablet (90 mg total) by mouth daily.   30 tablet   1   . omeprazole (PRILOSEC) 20 MG capsule      1 po every morning 30 minutes prior to your first meal.   31 capsule   11   . sodium bicarbonate 650 MG tablet   Oral   Take 1 tablet (650 mg total) by mouth 2 (two) times daily.   60 tablet   1    BP 156/64  Pulse 91  Temp(Src) 98.2 F (36.8 C) (Oral)  Resp 18  Ht 5\' 9"  (1.753 m)  Wt 180 lb (81.647 kg)  BMI 26.57 kg/m2  SpO2 100% Physical Exam 1715: Physical examination:  Nursing notes reviewed; Vital signs and O2 SAT reviewed;  Constitutional: Well developed, Well nourished, Well hydrated, In no acute distress; Head:  Normocephalic, atraumatic; Eyes: EOMI, PERRL, No scleral icterus. Conjunctiva pale.; ENMT: Mouth and pharynx normal, Mucous membranes moist; Neck: Supple, Full range of motion, No lymphadenopathy; Cardiovascular: Regular rate and rhythm, No gallop; Respiratory: Breath sounds clear & equal bilaterally, No wheezes.  Speaking full sentences with ease, Normal respiratory effort/excursion; Chest: Nontender, Movement normal; Abdomen: Soft, Nontender, Nondistended, Normal bowel sounds; Genitourinary: No CVA tenderness; Extremities: Pulses normal, No tenderness, No edema, No calf edema or asymmetry.; Neuro: AA&Ox3, Major CN grossly intact.  Speech clear. No gross focal motor or sensory deficits in extremities.; Skin: Color pale, Warm, Dry.   ED Course  Procedures     EKG Interpretation    Date/Time:  Saturday August 18 2013 18:18:37 EST Ventricular Rate:  94 PR Interval:  164 QRS Duration: 88 QT Interval:  382 QTC Calculation: 477 R Axis:   59 Text Interpretation:  Normal sinus rhythm Baseline wander Artifact Possible Left atrial enlargement Left ventricular hypertrophy Nonspecific  T wave abnormality Prolonged QT Abnormal ECG When compared with ECG of 30-Mar-2010 08:45, No significant change was found Confirmed by [facility]  MD, Nunzio Cory [phone]) on 08/18/2013 7:01:21 PM            MDM  MDM Reviewed: previous chart, nursing note and vitals Reviewed previous: labs Interpretation: labs Total time providing critical care: 30-74 minutes. This excludes time spent performing separately reportable procedures and services. Consults: admitting MD  CRITICAL CARE Performed by: Alfonzo Feller Total critical care time: 35 Critical care time was exclusive of separately billable procedures and treating other patients. Critical care was necessary to treat or prevent imminent or life-threatening deterioration. Critical care was time spent personally by me on the following activities: development of treatment plan with patient and/or surrogate as well as nursing, discussions with consultants, evaluation of patient's response to treatment, examination of patient, obtaining history from patient or surrogate, ordering and performing treatments and interventions, ordering and review of laboratory studies, ordering and review of radiographic studies, pulse oximetry and re-evaluation of patient's condition.   Dg Chest 2 View 08/18/2013   CLINICAL DATA:  Anemia  EXAM: CHEST  2 VIEW  COMPARISON:  06/07/2013  FINDINGS: Mild bibasilar opacities, likely atelectasis. No focal consolidation. No pleural effusion or pneumothorax.  Heart is top-normal in size.  Degenerative changes of the visualized thoracolumbar spine.  IMPRESSION: No evidence of acute cardiopulmonary disease.   Electronically Signed   By: Julian Hy M.D.   On: 08/18/2013 18:37    Results for orders placed during the hospital encounter of 08/18/13  URINALYSIS W MICROSCOPIC + REFLEX CULTURE      Result Value Range   Color, Urine YELLOW  YELLOW   APPearance CLEAR  CLEAR   Specific Gravity, Urine 1.020  1.005 - 1.030    pH 6.0  5.0 - 8.0   Glucose, UA NEGATIVE  NEGATIVE mg/dL   Hgb urine dipstick TRACE (*) NEGATIVE   Bilirubin Urine NEGATIVE  NEGATIVE   Ketones, ur NEGATIVE  NEGATIVE mg/dL   Protein, ur >300 (*) NEGATIVE mg/dL   Urobilinogen, UA 0.2  0.0 - 1.0 mg/dL   Nitrite NEGATIVE  NEGATIVE   Leukocytes, UA NEGATIVE  NEGATIVE   WBC, UA 0-2  <3 WBC/hpf   Bacteria, UA FEW (*) RARE  TROPONIN I      Result Value Range   Troponin I <0.30  <0.30 ng/mL  CBC WITH DIFFERENTIAL      Result Value Range   WBC 6.9  4.0 - 10.5 K/uL   RBC 2.95 (*) 3.87 - 5.11 MIL/uL   Hemoglobin 7.2 (*) 12.0 - 15.0 g/dL   HCT 21.8 (*) 36.0 - 46.0 %   MCV 73.9 (*) 78.0 - 100.0 fL   MCH 24.4 (*) 26.0 - 34.0 pg   MCHC 33.0  30.0 - 36.0 g/dL   RDW 20.3 (*) 11.5 - 15.5 %   Platelets 221  150 - 400 K/uL   Neutrophils Relative % 63  43 - 77 %   Neutro Abs 4.3  1.7 - 7.7 K/uL   Lymphocytes Relative 23  12 - 46 %   Lymphs Abs 1.6  0.7 - 4.0 K/uL   Monocytes Relative 10  3 - 12 %   Monocytes Absolute 0.7  0.1 - 1.0 K/uL   Eosinophils Relative 4  0 - 5 %   Eosinophils Absolute 0.2  0.0 - 0.7 K/uL   Basophils Relative 0  0 - 1 %   Basophils Absolute 0.0  0.0 - 0.1 K/uL  BASIC METABOLIC PANEL      Result Value Range   Sodium 141  137 - 147 mEq/L   Potassium 3.9  3.7 - 5.3 mEq/L   Chloride 106  96 - 112 mEq/L   CO2 21  19 - 32 mEq/L   Glucose, Bld 95  70 - 99 mg/dL   BUN 49 (*) 6 - 23 mg/dL   Creatinine, Ser  5.09 (*) 0.50 - 1.10 mg/dL   Calcium 9.5  8.4 - 10.5 mg/dL   GFR calc non Af Amer 8 (*) >90 mL/min   GFR calc Af Amer 9 (*) >90 mL/min    1930:  H/H lower than baseline, will transfuse PRBC. BUN/Cr per baseline. Pt not orthostatic. Dx and testing d/w pt and family.  Questions answered.  Verb understanding, agreeable to observation admit. T/C to [facility] Dr. Darrick Meigs, case discussed, including:  HPI, pertinent PM/SHx, VS/PE, dx testing, ED course and treatment:  Agreeable to observation admit, requests to write temporary orders,  obtain medical bed to team 2.   Alfonzo Feller, DO 08/20/13 1146

## 2013-08-19 LAB — CBC
HCT: 20.7 % — ABNORMAL LOW (ref 36.0–46.0)
Hemoglobin: 6.8 g/dL — CL (ref 12.0–15.0)
MCH: 24.5 pg — AB (ref 26.0–34.0)
MCHC: 32.9 g/dL (ref 30.0–36.0)
MCV: 74.5 fL — AB (ref 78.0–100.0)
PLATELETS: 181 10*3/uL (ref 150–400)
RBC: 2.78 MIL/uL — ABNORMAL LOW (ref 3.87–5.11)
RDW: 19.9 % — AB (ref 11.5–15.5)
WBC: 7.4 10*3/uL (ref 4.0–10.5)

## 2013-08-19 LAB — CBC WITH DIFFERENTIAL/PLATELET
BASOS ABS: 0 10*3/uL (ref 0.0–0.1)
Basophils Relative: 0 % (ref 0–1)
EOS ABS: 0.2 10*3/uL (ref 0.0–0.7)
EOS PCT: 3 % (ref 0–5)
HEMATOCRIT: 26.8 % — AB (ref 36.0–46.0)
Hemoglobin: 9.2 g/dL — ABNORMAL LOW (ref 12.0–15.0)
LYMPHS PCT: 27 % (ref 12–46)
Lymphs Abs: 1.8 10*3/uL (ref 0.7–4.0)
MCH: 26.5 pg (ref 26.0–34.0)
MCHC: 34.3 g/dL (ref 30.0–36.0)
MCV: 77.2 fL — AB (ref 78.0–100.0)
MONO ABS: 0.6 10*3/uL (ref 0.1–1.0)
Monocytes Relative: 10 % (ref 3–12)
Neutro Abs: 4 10*3/uL (ref 1.7–7.7)
Neutrophils Relative %: 61 % (ref 43–77)
Platelets: 211 10*3/uL (ref 150–400)
RBC: 3.47 MIL/uL — ABNORMAL LOW (ref 3.87–5.11)
RDW: 18.9 % — AB (ref 11.5–15.5)
WBC: 6.6 10*3/uL (ref 4.0–10.5)

## 2013-08-19 LAB — COMPREHENSIVE METABOLIC PANEL
ALT: 11 U/L (ref 0–35)
AST: 15 U/L (ref 0–37)
Albumin: 2.8 g/dL — ABNORMAL LOW (ref 3.5–5.2)
Alkaline Phosphatase: 60 U/L (ref 39–117)
BUN: 48 mg/dL — ABNORMAL HIGH (ref 6–23)
CALCIUM: 9.1 mg/dL (ref 8.4–10.5)
CHLORIDE: 106 meq/L (ref 96–112)
CO2: 20 meq/L (ref 19–32)
Creatinine, Ser: 4.8 mg/dL — ABNORMAL HIGH (ref 0.50–1.10)
GFR calc Af Amer: 10 mL/min — ABNORMAL LOW (ref >90)
GFR calc non Af Amer: 9 mL/min — ABNORMAL LOW (ref >90)
Glucose, Bld: 108 mg/dL — ABNORMAL HIGH (ref 70–99)
Potassium: 4 mEq/L (ref 3.7–5.3)
SODIUM: 140 meq/L (ref 137–147)
Total Bilirubin: 0.2 mg/dL — ABNORMAL LOW (ref 0.3–1.2)
Total Protein: 6.4 g/dL (ref 6.0–8.3)

## 2013-08-19 LAB — PREPARE RBC (CROSSMATCH)

## 2013-08-19 LAB — OCCULT BLOOD X 1 CARD TO LAB, STOOL: Fecal Occult Bld: NEGATIVE

## 2013-08-19 MED ORDER — FUROSEMIDE 10 MG/ML IJ SOLN
20.0000 mg | Freq: Once | INTRAMUSCULAR | Status: AC
  Administered 2013-08-19: 20 mg via INTRAVENOUS
  Filled 2013-08-19: qty 2

## 2013-08-19 MED ORDER — DARBEPOETIN ALFA-POLYSORBATE 40 MCG/0.4ML IJ SOLN: 40.0000 ug | INTRAMUSCULAR | Status: DC

## 2013-08-19 NOTE — Progress Notes (Signed)
[facility] PROGRESS NOTE  Sonya [name] [id number] DOB: [birth year] DOA: 08/18/2013 PCP: [name], MD  Assessment/Plan:  1-Symptomatic anemia Armanda Magic of chronic disease  Patient received 1 unit PRBC on 1-17. Hb this morning at 6.8. Didn't increase appropriately. No evidence of active bleeding. Bilirubin NL, no evidence of hemolysis. Will transfuse 2 units of PRBC. Repeat Hb in am.  Occult blood order.  Patient has anemia due to underlying C. KD. She has been on Procrit in the past but hasn't received over the past few days. Will ask CM to help find out why patient can not get procrit.  Will give 20 mg IV lasix in between blood transfusion.   2-C. KD stage IV  Cr stable.  Will order erythropoietin level.   Code Status: Full Code.  Family Communication: Care discussed with patient.  Disposition Plan: remain inpatient   Consultants:  none  Procedures:  none  Antibiotics:  none  HPI/Subjective: Denies blood in the stool. Melena, hematemesis. No abdominal pain.    Objective: Filed Vitals:   08/19/13 0556  BP: 165/83  Pulse: 91  Temp: 99.2 F (37.3 C)  Resp: 20   No intake or output data in the 24 hours ending 08/19/13 0830 Filed Weights   08/18/13 1615 08/18/13 2112  Weight: 81.647 kg (180 lb) 84.006 kg (185 lb 3.2 oz)    Exam:   General: Alert, in no acute distress.   Cardiovascular: S 1, S 2 RRR  Respiratory: CTA  Abdomen: BS present, soft, NT  Musculoskeletal: no edema.    Data Reviewed: Basic Metabolic Panel:  Recent Labs Lab 08/18/13 1737 08/18/13 1838 08/19/13 0629  NA 141 141 140  K 3.8 3.9 4.0  CL 99 106 106  CO2 28 21 20   GLUCOSE 97 95 108*  BUN 31* 49* 48*  CREATININE 1.44* 5.09* 4.80*  CALCIUM 9.2 9.5 9.1   Liver Function Tests:  Recent Labs Lab 08/19/13 0629  AST 15  ALT 11  ALKPHOS 60  BILITOT 0.2*  PROT 6.4  ALBUMIN 2.8*   No results found for this basename: LIPASE, AMYLASE,  in the last 168  hours No results found for this basename: AMMONIA,  in the last 168 hours CBC:  Recent Labs Lab 08/18/13 1737 08/18/13 1838 08/19/13 0629  WBC 6.4 6.9 7.4  NEUTROABS 3.4 4.3  --   HGB 11.6* 7.2* 6.8*  HCT 35.3* 21.8* 20.7*  MCV 90.3 73.9* 74.5*  PLT 304 221 181   Cardiac Enzymes:  Recent Labs Lab 08/18/13 1838  TROPONINI <0.30   BNP (last 3 results) No results found for this basename: PROBNP,  in the last 8760 hours CBG: No results found for this basename: GLUCAP,  in the last 168 hours  No results found for this or any previous visit (from the past 240 hour(s)).   Studies: Dg Chest 2 View  08/18/2013   CLINICAL DATA:  Anemia  EXAM: CHEST  2 VIEW  COMPARISON:  06/07/2013  FINDINGS: Mild bibasilar opacities, likely atelectasis. No focal consolidation. No pleural effusion or pneumothorax.  Heart is top-normal in size.  Degenerative changes of the visualized thoracolumbar spine.  IMPRESSION: No evidence of acute cardiopulmonary disease.   Electronically Signed   By: Charline Bills M.D.   On: 08/18/2013 18:37    Scheduled Meds: . allopurinol  100 mg Oral Daily  . aspirin EC  81 mg Oral Daily  . docusate sodium  100 mg Oral BID  . hydrALAZINE  50 mg  Oral Q8H  . labetalol  300 mg Oral TID  . levothyroxine  50 mcg Oral QAC breakfast  . NIFEdipine  90 mg Oral Daily  . pantoprazole (PROTONIX) IV  40 mg Intravenous Q24H  . sodium bicarbonate  650 mg Oral BID  . sodium chloride  3 mL Intravenous Q12H   Continuous Infusions:   Active Problems:   Anemia in chronic kidney disease   Anemia   Chronic kidney disease   Symptomatic anemia    Time spent: 35 minutes.     Lisabeth Mian  [facility] Pager [phone]. If 7PM-7AM, please contact night-coverage at [email or web address], password [facility] 08/19/2013, 8:30 AM  LOS: 1 day

## 2013-08-19 NOTE — Progress Notes (Signed)
Discussed with Dr. Tyrell Antonio the patients hgb 6.8.  We talked about that her blood was given through the night and the lab was called to me this am.

## 2013-08-20 DIAGNOSIS — I10 Essential (primary) hypertension: Secondary | ICD-10-CM

## 2013-08-20 LAB — TYPE AND SCREEN
ABO/RH(D): B POS
ANTIBODY SCREEN: NEGATIVE
UNIT DIVISION: 0
UNIT DIVISION: 0
Unit division: 0
Unit division: 0

## 2013-08-20 LAB — BASIC METABOLIC PANEL
BUN: 50 mg/dL — AB (ref 6–23)
CALCIUM: 9.4 mg/dL (ref 8.4–10.5)
CHLORIDE: 106 meq/L (ref 96–112)
CO2: 20 meq/L (ref 19–32)
Creatinine, Ser: 4.95 mg/dL — ABNORMAL HIGH (ref 0.50–1.10)
GFR calc Af Amer: 10 mL/min — ABNORMAL LOW (ref >90)
GFR calc non Af Amer: 8 mL/min — ABNORMAL LOW (ref >90)
GLUCOSE: 92 mg/dL (ref 70–99)
Potassium: 4 mEq/L (ref 3.7–5.3)
Sodium: 140 mEq/L (ref 137–147)

## 2013-08-20 LAB — CBC
HEMATOCRIT: 27.6 % — AB (ref 36.0–46.0)
Hemoglobin: 9.5 g/dL — ABNORMAL LOW (ref 12.0–15.0)
MCH: 26.5 pg (ref 26.0–34.0)
MCHC: 34.4 g/dL (ref 30.0–36.0)
MCV: 77.1 fL — ABNORMAL LOW (ref 78.0–100.0)
Platelets: 210 10*3/uL (ref 150–400)
RBC: 3.58 MIL/uL — AB (ref 3.87–5.11)
RDW: 18.7 % — ABNORMAL HIGH (ref 11.5–15.5)
WBC: 7.3 10*3/uL (ref 4.0–10.5)

## 2013-08-20 MED ORDER — HYDRALAZINE HCL 50 MG PO TABS
75.0000 mg | ORAL_TABLET | Freq: Three times a day (TID) | ORAL | Status: DC
Start: 2013-08-20 — End: 2013-10-26

## 2013-08-20 MED ORDER — NIFEDIPINE ER OSMOTIC RELEASE 90 MG PO TB24: 90.0000 mg | ORAL_TABLET | Freq: Every day | ORAL | Status: DC

## 2013-08-20 MED ORDER — FERROUS SULFATE CR 160 (50 FE) MG PO TBCR: 160.0000 mg | EXTENDED_RELEASE_TABLET | Freq: Every day | ORAL | Status: DC

## 2013-08-20 MED ORDER — LEVOTHYROXINE SODIUM 50 MCG PO TABS: 50.0000 ug | ORAL_TABLET | Freq: Every day | ORAL | Status: DC

## 2013-08-20 MED ORDER — LABETALOL HCL 300 MG PO TABS: 300.0000 mg | ORAL_TABLET | Freq: Three times a day (TID) | ORAL | Status: DC

## 2013-08-20 NOTE — Care Management Note (Signed)
    Page 1 of 1   08/20/2013     11:10:01 AM   CARE MANAGEMENT NOTE 08/20/2013  Patient:  Sonya [name], Sonya [name]   Account Number:  [id number]  Date Initiated:  08/20/2013  Documentation initiated by:  Theophilus Kinds  Subjective/Objective Assessment:   Pt admitted from home with anemia. Pt lives with her husband and will return home at discharge. Pt stated that she now has insurance with [employer] and is still waiting to hear from [facility]. Pt is independent with ADL's.     Action/Plan:   Benefits check in process for copay for aranesp. Pt will be notified once information obatined. No other CM needs noted. Pt discharged home today.   Anticipated DC Date:  08/20/2013   Anticipated DC Plan:  [place]  CM consult      Choice offered to / List presented to:             Status of service:  Completed, signed off Medicare Important Message given?   (If response is "NO", the following Medicare IM given date fields will be blank) Date Medicare IM given:   Date Additional Medicare IM given:    Discharge Disposition:  HOME/SELF CARE  Per UR Regulation:    If discussed at Long Length of Stay Meetings, dates discussed:    Comments:  08/20/13 [place], RN BSN CM

## 2013-08-20 NOTE — Progress Notes (Signed)
Patient discharged with instructions, prescriptions, and care notes.  She verbalized understanding and voiced by teach back the information about calling about the Aranesp medication administration appointment.  The patient left the floor via w/c with staff and family in stable condition.  The d/c summary was faxed to Dr. Nolon Rod office and the Referral form was faxed to Dr. Florentina Addison office.

## 2013-08-20 NOTE — Progress Notes (Signed)
Discussed with Elisa from short stay the patients plan for her Aranesp orders.  She stated that the referral form should be sent to Dr. Florentina Addison office since he is the one ordering the medication.  I verbalized understanding and notified Dr. Florentina Addison office and spoke with Erline Levine who is now aware that the referral form is to be given to [facility] so that the patient can begin the process for the medication.  I voiced to Elisa that Tammy with CM stated that the pts insurance office was closed today so she was not able to check about the medication due to the office being closed.  She stated that she would follow up with the office at a later date and follow up with the patient at home via calling her.

## 2013-08-20 NOTE — Discharge Summary (Signed)
Physician Discharge Summary  Sonya [name] DOB: [birth year] DOA: 08/18/2013  PCP: [name], MD  Admit date: 08/18/2013 Discharge date: 08/20/2013  Time spent: >35 minutes  Recommendations for Outpatient Follow-up:  F/u with PCP in 1-2 weeks F/u with nephrologist as scheduled  Discharge Diagnoses:  Active Problems:   Anemia in chronic kidney disease   Anemia   Chronic kidney disease   Symptomatic anemia   Discharge Condition: stable  Diet recommendation: heat healthy   Filed Weights   08/18/13 1615 08/18/13 2112  Weight: 81.647 kg (180 lb) 84.006 kg (185 lb 3.2 oz)    History of present illness:  70 your old female who has a past medical history of HTN (hypertension); Hypothyroidism; Uterine cancer (2012); Anemia; CKD (chronic kidney disease); Dysphagia; and Hyperparathyroidism due to renal insufficiency.  Today presented to the ED for blood transfusion. Patient was seen at the nephrology clinic today and was told that her hemoglobin was around 5 and that she needed to go to ED for blood transfusion   Hospital Course:  1-Symptomatic anemia /Anemia of chronic disease  No evidence of active bleeding. Bilirubin NL, no evidence of hemolysis. transfuse 3 units of PRBC. Repeat Hb stable;  -cont OP f/u; need Epogen SQ arranged by PCP;    2-C. KD stage IV  Cr stable. No s/s of uremia; patient i snot oliguric; cont OP follow up by nephrology, has appointment for access evaluation this month  3. HTN nto at goal; increased hydralazine, cont outpatient titration as needed    Procedures:  None  (i.e. Studies not automatically included, echos, thoracentesis, etc; not x-rays)  Consultations:  None   Discharge Exam: Filed Vitals:   08/20/13 0652  BP: 159/78  Pulse: 88  Temp: 97.8 F (36.6 C)  Resp: 20    General: alert Cardiovascular: s1,s2 rrr Respiratory: CTA BL  Discharge Instructions  Discharge Orders   Future Appointments Provider Department  Dept Phone   08/31/2013 12:30 PM [facility] [place] [phone]   08/31/2013 1:00 PM [facility] [place] [id number]   08/31/2013 1:30 PM Conrad [place], MD [facility] [phone]   09/03/2013 10:00 AM Orvil Feil, NP [facility] [phone]   Future Orders Complete By Expires   Diet - low sodium heart healthy  As directed    Discharge instructions  As directed    Comments:     Follow up with primary care doctor in 1-2 week   Increase activity slowly  As directed        Medication List         allopurinol 100 MG tablet  Commonly known as:  ZYLOPRIM  Take 100 mg by mouth daily.     aspirin EC 81 MG tablet  Take 81 mg by mouth daily.     darbepoetin 40 MCG/0.4ML Soln injection  Commonly known as:  ARANESP  Inject 0.4 mLs (40 mcg total) into the skin every 7 (seven) days.     docusate sodium 100 MG capsule  Commonly known as:  COLACE  Take 100 mg by mouth 2 (two) times daily.     ferrous sulfate dried 160 (50 FE) MG Tbcr SR tablet  Commonly known as:  SLOW FE  Take 1 tablet (160 mg total) by mouth daily.     hydrALAZINE 50 MG tablet  Commonly known as:  APRESOLINE  Take 1.5 tablets (75 mg total) by mouth every  8 (eight) hours.     labetalol 300 MG tablet  Commonly known as:  NORMODYNE  Take 1 tablet (300 mg total) by mouth 3 (three) times daily.     levothyroxine 50 MCG tablet  Commonly known as:  SYNTHROID, LEVOTHROID  Take 1 tablet (50 mcg total) by mouth daily.     meclizine 25 MG tablet  Commonly known as:  ANTIVERT  Take 25 mg by mouth 3 (three) times daily as needed for dizziness.     NIFEdipine 90 MG 24 hr tablet  Commonly known as:  PROCARDIA XL/ADALAT-CC  Take 1 tablet (90 mg total) by mouth daily.     omeprazole 20 MG capsule  Commonly known as:  PRILOSEC  Take 20 mg by mouth daily.     sodium bicarbonate 650 MG tablet  Take 1  tablet (650 mg total) by mouth 2 (two) times daily.       Allergies  Allergen Reactions  . Cephalexin        Follow-up Information   Follow up with [name], MD In 1 week.   Specialty:  Internal Medicine   Contact information:   [id number] [place] [address] [phone]        The results of significant diagnostics from this hospitalization (including imaging, microbiology, ancillary and laboratory) are listed below for reference.    Significant Diagnostic Studies: Dg Chest 2 View  08/18/2013   CLINICAL DATA:  Anemia  EXAM: CHEST  2 VIEW  COMPARISON:  06/07/2013  FINDINGS: Mild bibasilar opacities, likely atelectasis. No focal consolidation. No pleural effusion or pneumothorax.  Heart is top-normal in size.  Degenerative changes of the visualized thoracolumbar spine.  IMPRESSION: No evidence of acute cardiopulmonary disease.   Electronically Signed   By: Julian Hy M.D.   On: 08/18/2013 18:37    Microbiology: No results found for this or any previous visit (from the past 240 hour(s)).   Labs: Basic Metabolic Panel:  Recent Labs Lab 08/18/13 1737 08/18/13 1838 08/19/13 0629 08/20/13 0443  NA 141 141 140 140  K 3.8 3.9 4.0 4.0  CL 99 106 106 106  CO2 28 21 20 20   GLUCOSE 97 95 108* 92  BUN 31* 49* 48* 50*  CREATININE 1.44* 5.09* 4.80* 4.95*  CALCIUM 9.2 9.5 9.1 9.4   Liver Function Tests:  Recent Labs Lab 08/19/13 0629  AST 15  ALT 11  ALKPHOS 60  BILITOT 0.2*  PROT 6.4  ALBUMIN 2.8*   No results found for this basename: LIPASE, AMYLASE,  in the last 168 hours No results found for this basename: AMMONIA,  in the last 168 hours CBC:  Recent Labs Lab 08/18/13 1737 08/18/13 1838 08/19/13 0629 08/19/13 2022 08/20/13 0443  WBC 6.4 6.9 7.4 6.6 7.3  NEUTROABS 3.4 4.3  --  4.0  --   HGB 11.6* 7.2* 6.8* 9.2* 9.5*  HCT 35.3* 21.8* 20.7* 26.8* 27.6*  MCV 90.3 73.9* 74.5* 77.2* 77.1*  PLT 304 221 181 211 210    Cardiac Enzymes:  Recent Labs Lab 08/18/13 1838  TROPONINI <0.30   BNP: BNP (last 3 results) No results found for this basename: PROBNP,  in the last 8760 hours CBG: No results found for this basename: GLUCAP,  in the last 168 hours     Signed:  Kinnie Feil  [facility] 08/20/2013, 9:16 AM

## 2013-08-20 NOTE — Progress Notes (Signed)
Discussed the patients plan for her ARANESP order for out patient administration.  She stated that I shoould call Kim Faint the scheduler for the procedures to set up the med administration.  After talking with Maudie Mercury she stated that a referral form for the medication would have to be filled out prior to setting an appointment.  I notified Dr. Nevin Bloodgood office and talked with Alton Revere of Dr. Nolon Rod office and notified her of the need for the referral form.  I voiced to her I would be faxing along with her d/c summary a copy of the referral form.  She verbalized understanding.  The office stted they needed the summary before they could set the appt.  I discussed this with the patient and voiced to her that she should contact the office within 2-4 days if she dosen't hear anything due to the risk of reoccurnt anemia.  She verbalized understanding.

## 2013-08-21 LAB — ERYTHROPOIETIN: Erythropoietin: 7 m[IU]/mL (ref 2.6–18.5)

## 2013-08-30 ENCOUNTER — Encounter: Payer: Self-pay | Admitting: Vascular Surgery

## 2013-08-31 ENCOUNTER — Encounter: Payer: Self-pay | Admitting: Vascular Surgery

## 2013-08-31 ENCOUNTER — Ambulatory Visit (HOSPITAL_COMMUNITY)
Admission: RE | Admit: 2013-08-31 | Discharge: 2013-08-31 | Disposition: A | Discharge status: Home / Self Care | Payer: BC Managed Care – PPO | Source: Ambulatory Visit | Attending: Vascular Surgery | Admitting: Vascular Surgery

## 2013-08-31 ENCOUNTER — Ambulatory Visit (INDEPENDENT_AMBULATORY_CARE_PROVIDER_SITE_OTHER): Payer: BC Managed Care – PPO | Admitting: Vascular Surgery

## 2013-08-31 ENCOUNTER — Ambulatory Visit (INDEPENDENT_AMBULATORY_CARE_PROVIDER_SITE_OTHER)
Admission: RE | Admit: 2013-08-31 | Discharge: 2013-08-31 | Disposition: A | Discharge status: Home / Self Care | Payer: BC Managed Care – PPO | Source: Ambulatory Visit | Attending: Vascular Surgery | Admitting: Vascular Surgery

## 2013-08-31 VITALS — BP 167/80 | HR 76 | Ht 69.0 in | Wt 186.6 lb

## 2013-08-31 DIAGNOSIS — Z01810 Encounter for preprocedural cardiovascular examination: Secondary | ICD-10-CM | POA: Insufficient documentation

## 2013-08-31 DIAGNOSIS — N185 Chronic kidney disease, stage 5: Secondary | ICD-10-CM

## 2013-08-31 DIAGNOSIS — N186 End stage renal disease: Secondary | ICD-10-CM | POA: Insufficient documentation

## 2013-08-31 NOTE — Progress Notes (Signed)
Referred by:  Redmond School, MD [id number] [place], [place] [address]  Reason for referral: New access  History of Present Illness  Sonya [name] is a 63 y.o. (02/07/1951) female who presents for evaluation for permanent access.  The patient is left hand dominant.  The patient has not had previous access procedures.  Previous central venous cannulation procedures include: none.  The patient has never had a PPM placed.  Per pt, her GFR is 11.  Her CKD is thought due to HTN.  She has not undergone HD education yet and has not fully decided to proceed with HD.  Past Medical History  Diagnosis Date  . HTN (hypertension)   . Hypothyroidism   . Uterine cancer 2012  . Anemia   . CKD (chronic kidney disease)   . Dysphagia   . Hyperparathyroidism due to renal insufficiency     Past Surgical History  Procedure Laterality Date  . Cholecystectomy    . Cesarean section    . Laparoscopic total hysterectomy    . Colonoscopy  2000    TICS, [facility]  . Colonoscopy  2003 NUR BRBPR D50 V6    DC/[facility] T[facility], [facility]  . Abdominal hysterectomy    . Nasal septum surgery    . Esophagogastroduodenoscopy (egd) with esophageal dilation N/A 06/08/2013    [id number] at the gastroesophageal junction/MILD NON-erosive gastritis (inflammation) was found in the gastric antrum; multiple biopsies/NO SOURCE FOR ANEMIA IDETIFIED-MOST LIKELY DUE TO ANEMIA OF CHRONIC DISEASE    History   Social History  . Marital Status: Married    Spouse Name: N/A    Number of Children: N/A  . Years of Education: N/A   Occupational History  . Not on file.   Social History Main Topics  . Smoking status: Never Smoker   . Smokeless tobacco: Not on file  . Alcohol Use: No  . Drug Use: No  . Sexual Activity: Not on file   Other Topics Concern  . 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    Family History  Problem Relation Age of Onset  . Colon cancer  Neg Hx   . Cancer Mother   . Diabetes Mother   . Hypertension Mother   . Diabetes Father   . Hypertension Father   . Hypertension Sister      Current Outpatient Prescriptions on File Prior to Visit  Medication Sig Dispense Refill  . allopurinol (ZYLOPRIM) 100 MG tablet Take 300 mg by mouth daily.       Marland Kitchen aspirin EC 81 MG tablet Take 81 mg by mouth daily.      . darbepoetin (ARANESP) 40 MCG/0.4ML SOLN injection Inject 0.4 mLs (40 mcg total) into the skin every 7 (seven) days.  8.4 mL    . docusate sodium (COLACE) 100 MG capsule Take 100 mg by mouth 2 (two) times daily.      . ferrous sulfate dried (SLOW FE) 160 (50 FE) MG TBCR SR tablet Take 1 tablet (160 mg total) by mouth daily.  60 tablet  3  . hydrALAZINE (APRESOLINE) 50 MG tablet Take 1.5 tablets (75 mg total) by mouth every 8 (eight) hours.  90 tablet  1  . labetalol (NORMODYNE) 300 MG tablet Take 1 tablet (300 mg total) by mouth 3 (three) times daily.  90 tablet  1  . levothyroxine (SYNTHROID, LEVOTHROID) 50 MCG tablet Take 1 tablet (50 mcg  total) by mouth daily.  30 tablet  2  . meclizine (ANTIVERT) 25 MG tablet Take 25 mg by mouth 3 (three) times daily as needed for dizziness.      Marland Kitchen NIFEdipine (PROCARDIA XL/ADALAT-CC) 90 MG 24 hr tablet Take 1 tablet (90 mg total) by mouth daily.  30 tablet  1  . omeprazole (PRILOSEC) 20 MG capsule Take 20 mg by mouth daily.      . sodium bicarbonate 650 MG tablet Take 1 tablet (650 mg total) by mouth 2 (two) times daily.  60 tablet  1   No current facility-administered medications on file prior to visit.    Allergies  Allergen Reactions  . Cephalexin    REVIEW OF SYSTEMS:  (Positives checked otherwise negative)  CARDIOVASCULAR:  []  chest pain, []  chest pressure, []  palpitations, []  shortness of breath when laying flat, []  shortness of breath with exertion,  []  pain in feet when walking, []  pain in feet when laying flat, []  history of blood clot in veins (DVT), []  history of phlebitis, []   swelling in legs, []  varicose veins  PULMONARY:  []  productive cough, []  asthma, []  wheezing  NEUROLOGIC:  [x]  weakness in arms or legs, []  numbness in arms or legs, []  difficulty speaking or slurred speech, []  temporary loss of vision in one eye, [x]  dizziness  HEMATOLOGIC:  []  bleeding problems, []  problems with blood clotting too easily  MUSCULOSKEL:  []  joint pain, []  joint swelling  GASTROINTEST:  []  vomiting blood, []  blood in stool     GENITOURINARY:  []  burning with urination, []  blood in urine  PSYCHIATRIC:  []  history of major depression  INTEGUMENTARY:  []  rashes, []  ulcers  CONSTITUTIONAL:  []  fever, [x]  chills  For VQI Use Only  PRE-ADM LIVING: Home  AMB STATUS: Ambulatory  CAD Sx: None  PRIOR CHF: None  STRESS TEST: [x]  No, [ ]  Normal, [ ]  + ischemia, [ ]  + MI, [ ]  Both   Physical Examination  Filed Vitals:   08/31/13 1315  BP: 167/80  Pulse: 76  Height: 5\' 9"  (1.753 m)  Weight: 186 lb 9.6 oz (84.641 kg)  SpO2: 100%   Body mass index is 27.54 kg/(m^2).  General: A&O x 3, WD  Head: Maria Antonia/AT  Ear/Nose/Throat: Hearing grossly intact, nares w/o erythema or drainage, oropharynx w/o Erythema/Exudate, Mallampati score: 3  Eyes: PERRLA, EOMI  Neck: Supple, no nuchal rigidity, no palpable LAD  Pulmonary: Sym exp, good air movt, CTAB, no rales, rhonchi, & wheezing  Cardiac: RRR, Nl S1, S2, no Murmurs, rubs or gallops  Vascular: Vessel Right Left  Radial Palpable Palpable  Ulnar Not Palpable Not Palpable  Brachial Palpable Palpable  Carotid Palpable, without bruit Palpable, without bruit  Aorta Not palpable N/A  Femoral Palpable Palpable  Popliteal Not palpable Not palpable  PT Faintly Palpable Faintly Palpable  DP Not Palpable Not Palpable   Gastrointestinal: soft, NTND, -G/R, - HSM, - masses, - CVAT B  Musculoskeletal: M/S 5/5 throughout , Extremities without ischemic changes , On Sonosite: 4-6 mm brachial vein adjacent to brachial  artery  Neurologic: CN 2-12 intact , Pain and light touch intact in extremities , Motor exam as listed above  Psychiatric: Judgment intact, Mood & affect appropriate for pt's clinical situation  Dermatologic: See M/S exam for extremity exam, no rashes otherwise noted  Lymph : No Cervical, Axillary, or Inguinal lymphadenopathy   Non-Invasive Vascular Imaging  Vein Mapping  (Date: 08/31/2013):   R arm: acceptable vein conduits  include none  L arm: acceptable vein conduits include none  Outside Studies/Documentation 4 pages of outside documents were reviewed including: outside.  Medical Decision Making  DHVANI STEPHENSON is a 63 y.o. female who presents with chronic kidney disease stage V   Based on vein mapping and examination, this patient's permanent access options include: R staged BRVT vs R FA AVG.  I had an extensive discussion with this patient in regards to the nature of access surgery, including risk, benefits, and alternatives.    The patient is aware that the risks of access surgery include but are not limited to: bleeding, infection, steal syndrome, nerve damage, ischemic monomelic neuropathy, failure of access to mature, and possible need for additional access procedures in the future. I discussed with the patient the nature of the staged access procedure, specifically the need for a second operation to transpose the first stage fistula if it matures adequately.    The patient has not agreed to proceed with the above procedure.  She will let us know when and if she is ready to proceed.  I discussed with the patient likely delay in available access as a staged transposition is needed.  Adele Barthel, MD [facility] Office: [phone] Pager: [phone]  08/31/2013, 2:46 PM

## 2013-09-03 ENCOUNTER — Ambulatory Visit (INDEPENDENT_AMBULATORY_CARE_PROVIDER_SITE_OTHER): Payer: BC Managed Care – PPO | Admitting: Gastroenterology

## 2013-09-03 ENCOUNTER — Encounter: Payer: Self-pay | Admitting: Gastroenterology

## 2013-09-03 VITALS — BP 161/79 | HR 90 | Temp 97.0°F | Wt 183.0 lb

## 2013-09-03 DIAGNOSIS — D649 Anemia, unspecified: Secondary | ICD-10-CM

## 2013-09-03 DIAGNOSIS — K5900 Constipation, unspecified: Secondary | ICD-10-CM

## 2013-09-03 MED ORDER — LINACLOTIDE 145 MCG PO CAPS: 145.0000 ug | ORAL_CAPSULE | Freq: Every day | ORAL | Status: DC

## 2013-09-03 MED ORDER — PEG 3350-KCL-NA BICARB-NACL 420 G PO SOLR: 4000.0000 mL | ORAL | Status: DC

## 2013-09-03 NOTE — Patient Instructions (Signed)
For constipation: start taking Linzess 1 capsule each morning on an empty stomach, 30 minutes before breakfast. We have provided a voucher for a month free. I also sent refills to the pharmacy.   We have scheduled you for a colonoscopy with Dr. Oneida Alar in the near future.

## 2013-09-03 NOTE — Assessment & Plan Note (Signed)
Start Linzess 145 mcg daily. Colonoscopy planned. Samples and voucher provided.

## 2013-09-03 NOTE — Progress Notes (Signed)
Referring Provider: Redmond School, MD Primary Care Physician:  [name], MD Primary GI: Dr. Oneida Alar   Chief Complaint  Patient presents with  . Anemia  . Colonoscopy    HPI:   Sonya [name] returns today with a history of anemia and need to schedule updated colonoscopy. Anemia likely of chronic disease; however, last iron studies in Nov 2014 with mildly low iron at 38, elevated ferritin at 431. Recent Hgb 9.5. On iron, states stool is dark. Heme negative. No bright red blood per rectum. No abdominal pain. States hard to have a bowel movement. Constipation. Sometimes up to 4-5 days at a time, then has to take a laxative. Constipation since hysterectomy, which was several years ago. Feels like it is worsening. No dysphagia. Prilosec for GERD, which controls symptoms. Occasional nausea, Zofran prn. No NSAIDs.   EGD on file from Nov 2014 with stricture at GE junction, mild gastritis. Last colonoscopy in 2003.    Past Medical History  Diagnosis Date  . HTN (hypertension)   . Hypothyroidism   . Uterine cancer 2012  . Anemia   . CKD (chronic kidney disease)   . Dysphagia   . Hyperparathyroidism due to renal insufficiency     Past Surgical History  Procedure Laterality Date  . Cholecystectomy    . Cesarean section    . Laparoscopic total hysterectomy    . Colonoscopy  2000    TICS, [facility]  . Colonoscopy  2003 NUR BRBPR D50 V6    DC/[place] TICS, [facility]  . Abdominal hysterectomy    . Nasal septum surgery    . Esophagogastroduodenoscopy (egd) with esophageal dilation N/A 06/08/2013    Dr. Fields:Stricture at the gastroesophageal junction/MILD NON-erosive gastritis (inflammation) was found in the gastric antrum; multiple biopsies/NO SOURCE FOR ANEMIA IDETIFIED-MOST LIKELY DUE TO ANEMIA OF CHRONIC DISEASE    Current Outpatient Prescriptions  Medication Sig Dispense Refill  . allopurinol (ZYLOPRIM) 100 MG tablet Take 300 mg by mouth daily.       Marland Kitchen aspirin EC 81 MG tablet Take 81  mg by mouth daily.      . darbepoetin (ARANESP) 40 MCG/0.4ML SOLN injection Inject 0.4 mLs (40 mcg total) into the skin every 7 (seven) days.  8.4 mL    . docusate sodium (COLACE) 100 MG capsule Take 100 mg by mouth 2 (two) times daily.      . ferrous sulfate dried (SLOW FE) 160 (50 FE) MG TBCR SR tablet Take 1 tablet (160 mg total) by mouth daily.  60 tablet  3  . hydrALAZINE (APRESOLINE) 50 MG tablet Take 1.5 tablets (75 mg total) by mouth every 8 (eight) hours.  90 tablet  1  . labetalol (NORMODYNE) 300 MG tablet Take 1 tablet (300 mg total) by mouth 3 (three) times daily.  90 tablet  1  . levothyroxine (SYNTHROID, LEVOTHROID) 50 MCG tablet Take 1 tablet (50 mcg total) by mouth daily.  30 tablet  2  . meclizine (ANTIVERT) 25 MG tablet Take 25 mg by mouth 3 (three) times daily as needed for dizziness.      Marland Kitchen NIFEdipine (PROCARDIA XL/ADALAT-CC) 90 MG 24 hr tablet Take 1 tablet (90 mg total) by mouth daily.  30 tablet  1  . omeprazole (PRILOSEC) 20 MG capsule Take 20 mg by mouth daily.      . ondansetron (ZOFRAN) 4 MG tablet Take 4 mg by mouth every 8 (eight) hours as needed for nausea or vomiting.      Marland Kitchen  sodium bicarbonate 650 MG tablet Take 1 tablet (650 mg total) by mouth 2 (two) times daily.  60 tablet  1   No current facility-administered medications for this visit.    Allergies as of 09/03/2013 - Review Complete 09/03/2013  Allergen Reaction Noted  . Cephalexin  04/08/2011    Family History  Problem Relation Age of Onset  . Colon cancer Neg Hx   . Cancer Mother   . Diabetes Mother   . Hypertension Mother   . Diabetes Father   . Hypertension Father   . Hypertension Sister     History   Social History  . Marital Status: Married    Spouse Name: N/A    Number of Children: N/A  . Years of Education: N/A   Social History Main Topics  . Smoking status: Never Smoker   . Smokeless tobacco: None  . Alcohol Use: No  . Drug Use: No  . Sexual Activity: None   Other Topics  Concern  . 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    Review of Systems: Gen: +fatigue CV: Denies chest pain, palpitations, syncope, peripheral edema, and claudication. Resp: Denies dyspnea at rest, cough, wheezing, coughing up blood, and pleurisy. GI: see HPI Derm: Denies rash, itching, dry skin Psych: Denies depression, anxiety, memory loss, confusion. No homicidal or suicidal ideation.  Heme: Denies bruising, bleeding, and enlarged lymph nodes.  Physical Exam: BP 161/79  Pulse 90  Temp(Src) 97 F (36.1 C) (Oral)  Wt 183 lb (83.008 kg) General:   Alert and oriented. No distress noted. Pleasant and cooperative.  Head:  Normocephalic and atraumatic. Eyes:  Conjuctiva clear without scleral icterus. Mouth:  Oral mucosa pink and moist. Good dentition. No lesions. Neck:  Supple, without mass or thyromegaly. Heart:  S1, S2 present without murmurs, rubs, or gallops. Regular rate and rhythm. Abdomen:  +BS, soft, non-tender and non-distended. No rebound or guarding. No HSM or masses noted. Msk:  Symmetrical without gross deformities. Normal posture. Extremities:  Trace ankle and pedal edema Neurologic:  Alert and  oriented x4;  grossly normal neurologically. Skin:  Intact without significant lesions or rashes. Cervical Nodes:  No significant cervical adenopathy. Psych:  Alert and cooperative. Normal mood and affect.  Lab Results  Component Value Date   WBC 7.3 08/20/2013   HGB 9.5* 08/20/2013   HCT 27.6* 08/20/2013   MCV 77.1* 08/20/2013   PLT 210 08/20/2013   Lab Results  Component Value Date   IRON 38* 06/20/2013   TIBC 148* 06/20/2013   FERRITIN 431* 06/20/2013   Lab Results  Component Value Date   OCCULTBLD NEGATIVE 08/19/2013   OCCULTBLD NEGATIVE* 06/09/2013

## 2013-09-03 NOTE — Assessment & Plan Note (Signed)
62 year old female with anemia likely of chronic disease, with EGD on file and due for routine screening colonoscopy. No overt signs of GI bleeding, heme negative, Hgb stable. Last colonoscopy in 2003.   Proceed with colonoscopy with Dr. Oneida Alar in the near future. The risks, benefits, and alternatives have been discussed in detail with the patient. They state understanding and desire to proceed.

## 2013-09-04 ENCOUNTER — Encounter (HOSPITAL_COMMUNITY)
Admission: RE | Disposition: A | Discharge status: Home / Self Care | Payer: Self-pay | Source: Ambulatory Visit | Attending: Gastroenterology

## 2013-09-04 ENCOUNTER — Encounter (HOSPITAL_COMMUNITY): Payer: Self-pay | Admitting: *Deleted

## 2013-09-04 ENCOUNTER — Other Ambulatory Visit: Payer: Self-pay | Admitting: Gastroenterology

## 2013-09-04 ENCOUNTER — Encounter (HOSPITAL_COMMUNITY): Payer: Self-pay | Admitting: Pharmacy Technician

## 2013-09-04 ENCOUNTER — Ambulatory Visit (HOSPITAL_COMMUNITY)
Admission: RE | Admit: 2013-09-04 | Discharge: 2013-09-04 | Disposition: A | Discharge status: Home / Self Care | Payer: BC Managed Care – PPO | Source: Ambulatory Visit | Attending: Gastroenterology | Admitting: Gastroenterology

## 2013-09-04 ENCOUNTER — Telehealth: Payer: Self-pay | Admitting: Gastroenterology

## 2013-09-04 DIAGNOSIS — K5730 Diverticulosis of large intestine without perforation or abscess without bleeding: Secondary | ICD-10-CM | POA: Insufficient documentation

## 2013-09-04 DIAGNOSIS — K633 Ulcer of intestine: Secondary | ICD-10-CM | POA: Insufficient documentation

## 2013-09-04 DIAGNOSIS — D126 Benign neoplasm of colon, unspecified: Secondary | ICD-10-CM

## 2013-09-04 DIAGNOSIS — I10 Essential (primary) hypertension: Secondary | ICD-10-CM | POA: Insufficient documentation

## 2013-09-04 DIAGNOSIS — Z7982 Long term (current) use of aspirin: Secondary | ICD-10-CM | POA: Insufficient documentation

## 2013-09-04 DIAGNOSIS — D649 Anemia, unspecified: Secondary | ICD-10-CM

## 2013-09-04 DIAGNOSIS — Z79899 Other long term (current) drug therapy: Secondary | ICD-10-CM | POA: Insufficient documentation

## 2013-09-04 DIAGNOSIS — K5900 Constipation, unspecified: Secondary | ICD-10-CM

## 2013-09-04 DIAGNOSIS — K648 Other hemorrhoids: Secondary | ICD-10-CM | POA: Insufficient documentation

## 2013-09-04 HISTORY — PX: COLONOSCOPY: SHX5424

## 2013-09-04 SURGERY — COLONOSCOPY
Anesthesia: Moderate Sedation

## 2013-09-04 MED ORDER — HYDRALAZINE HCL 20 MG/ML IJ SOLN
10.0000 mg | Freq: Once | INTRAMUSCULAR | Status: AC
  Administered 2013-09-04: 10 mg via INTRAVENOUS

## 2013-09-04 MED ORDER — STERILE WATER FOR IRRIGATION IR SOLN
Status: DC | PRN
  Administered 2013-09-04: 10:00:00

## 2013-09-04 MED ORDER — FENTANYL CITRATE 0.05 MG/ML IJ SOLN
INTRAMUSCULAR | Status: DC | PRN
  Administered 2013-09-04 (×3): 25 ug via INTRAVENOUS

## 2013-09-04 MED ORDER — MIDAZOLAM HCL 5 MG/5ML IJ SOLN
INTRAMUSCULAR | Status: DC | PRN
  Administered 2013-09-04: 1 mg via INTRAVENOUS
  Administered 2013-09-04 (×3): 2 mg via INTRAVENOUS

## 2013-09-04 MED ORDER — MEPERIDINE HCL 100 MG/ML IJ SOLN
INTRAMUSCULAR | Status: AC
  Filled 2013-09-04: qty 1

## 2013-09-04 MED ORDER — LABETALOL HCL 5 MG/ML IV SOLN
10.0000 mg | Freq: Once | INTRAVENOUS | Status: AC
  Administered 2013-09-04: 10 mg via INTRAVENOUS

## 2013-09-04 MED ORDER — MEPERIDINE HCL 100 MG/ML IJ SOLN
INTRAMUSCULAR | Status: AC
  Filled 2013-09-04: qty 2

## 2013-09-04 MED ORDER — HYDRALAZINE HCL 20 MG/ML IJ SOLN
INTRAMUSCULAR | Status: AC
  Filled 2013-09-04: qty 1

## 2013-09-04 MED ORDER — MIDAZOLAM HCL 5 MG/5ML IJ SOLN
INTRAMUSCULAR | Status: AC
  Filled 2013-09-04: qty 10

## 2013-09-04 MED ORDER — LABETALOL HCL 5 MG/ML IV SOLN
INTRAVENOUS | Status: AC
  Filled 2013-09-04: qty 4

## 2013-09-04 MED ORDER — FENTANYL CITRATE 0.05 MG/ML IJ SOLN
INTRAMUSCULAR | Status: AC
  Filled 2013-09-04: qty 2

## 2013-09-04 MED ORDER — SODIUM CHLORIDE 0.9 % IV SOLN
INTRAVENOUS | Status: DC
  Administered 2013-09-04: 09:00:00 via INTRAVENOUS

## 2013-09-04 NOTE — Discharge Instructions (Signed)
You had 1 polyp removed. You have internal hemorrhoids and diverticulosis IN YOUR RIGHT AND LEFT COLON. The last part of your small bowel is normal. NO OBVIOUS SOURCE FOR A LOW BLOOD COUNT WAS IDENTIFIED.   NEXT WEEK YOU NEED A GIVENS CAPSULE STUDY TO COMPLETE THE EVALUATION FOR YOUR GI TRACT.  HOLD IRON. YOU MAY RE-START AFTER YOUR GIVENS CAPSULE STUDY IS COMPLETE.  HOLD ASA UNTIL YOU WORKUP FOR LOW BLOOD COUNT IS COMPLETE.  FOLLOW A HIGH FIBER DIET. AVOID ITEMS THAT CAUSE BLOATING. SEE INFO BELOW.  YOUR BIOPSY RESULTS SHOULD BE BACK IN 7 DAYS.  Next colonoscopy in 10 years.   Colonoscopy Care After Read the instructions outlined below and refer to this sheet in the next week. These discharge instructions provide you with general information on caring for yourself after you leave the hospital. While your treatment has been planned according to the most current medical practices available, unavoidable complications occasionally occur. If you have any problems or questions after discharge, call DR. Keene Gilkey, [phone].  ACTIVITY  You may resume your regular activity, but move at a slower pace for the next 24 hours.   Take frequent rest periods for the next 24 hours.   Walking will help get rid of the air and reduce the bloated feeling in your belly (abdomen).   No driving for 24 hours (because of the medicine (anesthesia) used during the test).   You may shower.   Do not sign any important legal documents or operate any machinery for 24 hours (because of the anesthesia used during the test).    NUTRITION  Drink plenty of fluids.   You may resume your normal diet as instructed by your doctor.   Begin with a light meal and progress to your normal diet. Heavy or fried foods are harder to digest and may make you feel sick to your stomach (nauseated).   Avoid alcoholic beverages for 24 hours or as instructed.    MEDICATIONS  You may resume your normal medications.   WHAT  YOU CAN EXPECT TODAY  Some feelings of bloating in the abdomen.   Passage of more gas than usual.   Spotting of blood in your stool or on the toilet paper  .  IF YOU HAD POLYPS REMOVED DURING THE COLONOSCOPY:  Eat a soft diet IF YOU HAVE NAUSEA, BLOATING, ABDOMINAL PAIN, OR VOMITING.    FINDING OUT THE RESULTS OF YOUR TEST Not all test results are available during your visit. DR. Oneida Alar WILL CALL YOU WITHIN 7 DAYS OF YOUR PROCEDUE WITH YOUR RESULTS. Do not assume everything is normal if you have not heard from DR. Lydiann Bonifas IN ONE WEEK, CALL HER OFFICE AT [phone].  SEEK IMMEDIATE MEDICAL ATTENTION AND CALL THE OFFICE: [phone] IF:  You have more than a spotting of blood in your stool.   Your belly is swollen (abdominal distention).   You are nauseated or vomiting.   You have a temperature over 101F.   You have abdominal pain or discomfort that is severe or gets worse throughout the day.  Polyps, Colon  A polyp is extra tissue that grows inside your body. Colon polyps grow in the large intestine. The large intestine, also called the colon, is part of your digestive system. It is a long, hollow tube at the end of your digestive tract where your body makes and stores stool. Most polyps are not dangerous. They are benign. This means they are not cancerous. But over time, some types of  polyps can turn into cancer. Polyps that are smaller than a pea are usually not harmful. But larger polyps could someday become or may already be cancerous. To be safe, doctors remove all polyps and test them.   WHO GETS POLYPS? Anyone can get polyps, but certain people are more likely than others. You may have a greater chance of getting polyps if:  You are over 50.   You have had polyps before.   Someone in your family has had polyps.   Someone in your family has had cancer of the large intestine.   Find out if someone in your family has had polyps. You may also be more likely to get  polyps if you:   Eat a lot of fatty foods   Smoke   Drink alcohol   Do not exercise  Eat too much   TREATMENT  The caregiver will remove the polyp during sigmoidoscopy or colonoscopy.  PREVENTION There is not one sure way to prevent polyps. You might be able to lower your risk of getting them if you:  Eat more fruits and vegetables and less fatty food.   Do not smoke.   Avoid alcohol.   Exercise every day.   Lose weight if you are overweight.   Eating more calcium and folate can also lower your risk of getting polyps. Some foods that are rich in calcium are milk, cheese, and broccoli. Some foods that are rich in folate are chickpeas, kidney beans, and spinach.   High-Fiber Diet A high-fiber diet changes your normal diet to include more whole grains, legumes, fruits, and vegetables. Changes in the diet involve replacing refined carbohydrates with unrefined foods. The calorie level of the diet is essentially unchanged. The Dietary Reference Intake (recommended amount) for adult males is 38 grams per day. For adult females, it is 25 grams per day. Pregnant and lactating women should consume 28 grams of fiber per day. Fiber is the intact part of a plant that is not broken down during digestion. Functional fiber is fiber that has been isolated from the plant to provide a beneficial effect in the body. PURPOSE  Increase stool bulk.   Ease and regulate bowel movements.   Lower cholesterol.  INDICATIONS THAT YOU NEED MORE FIBER  Constipation and hemorrhoids.   Uncomplicated diverticulosis (intestine condition) and irritable bowel syndrome.   Weight management.   As a protective measure against hardening of the arteries (atherosclerosis), diabetes, and cancer.   GUIDELINES FOR INCREASING FIBER IN THE DIET  Start adding fiber to the diet slowly. A gradual increase of about 5 more grams (2 slices of whole-wheat bread, 2 servings of most fruits or vegetables, or 1 bowl of  high-fiber cereal) per day is best. Too rapid an increase in fiber may result in constipation, flatulence, and bloating.   Drink enough water and fluids to keep your urine clear or pale yellow. Water, juice, or caffeine-free drinks are recommended. Not drinking enough fluid may cause constipation.   Eat a variety of high-fiber foods rather than one type of fiber.   Try to increase your intake of fiber through using high-fiber foods rather than fiber pills or supplements that contain small amounts of fiber.   The goal is to change the types of food eaten. Do not supplement your present diet with high-fiber foods, but replace foods in your present diet.  INCLUDE A VARIETY OF FIBER SOURCES  Replace refined and processed grains with whole grains, canned fruits with fresh fruits, and  incorporate other fiber sources. White rice, white breads, and most bakery goods contain little or no fiber.   Brown whole-grain rice, buckwheat oats, and many fruits and vegetables are all good sources of fiber. These include: broccoli, Brussels sprouts, cabbage, cauliflower, beets, sweet potatoes, white potatoes (skin on), carrots, tomatoes, eggplant, squash, berries, fresh fruits, and dried fruits.   Cereals appear to be the richest source of fiber. Cereal fiber is found in whole grains and bran. Bran is the fiber-rich outer coat of cereal grain, which is largely removed in refining. In whole-grain cereals, the bran remains. In breakfast cereals, the largest amount of fiber is found in those with "bran" in their names. The fiber content is sometimes indicated on the label.   You may need to include additional fruits and vegetables each day.   In baking, for 1 cup white flour, you may use the following substitutions:   1 cup whole-wheat flour minus 2 tablespoons.   1/2 cup white flour plus 1/2 cup whole-wheat flour.   Diverticulosis Diverticulosis is a common condition that develops when small pouches  (diverticula) form in the wall of the colon. The risk of diverticulosis increases with age. It happens more often in people who eat a low-fiber diet. Most individuals with diverticulosis have no symptoms. Those individuals with symptoms usually experience belly (abdominal) pain, constipation, or loose stools (diarrhea).  HOME CARE INSTRUCTIONS  Increase the amount of fiber in your diet as directed by your caregiver or dietician. This may reduce symptoms of diverticulosis.   Drink at least 6 to 8 glasses of water each day to prevent constipation.   Try not to strain when you have a bowel movement.   Avoiding nuts and seeds to prevent complications is still an uncertain benefit.       FOODS HAVING HIGH FIBER CONTENT INCLUDE:  Fruits. Apple, peach, pear, tangerine, raisins, prunes.   Vegetables. Brussels sprouts, asparagus, broccoli, cabbage, carrot, cauliflower, romaine lettuce, spinach, summer squash, tomato, winter squash, zucchini.   Starchy Vegetables. Baked beans, kidney beans, lima beans, split peas, lentils, potatoes (with skin).   Grains. Whole wheat bread, brown rice, bran flake cereal, plain oatmeal, white rice, shredded wheat, bran muffins.    SEEK IMMEDIATE MEDICAL CARE IF:  You develop increasing pain or severe bloating.   You have an oral temperature above 101F.   You develop vomiting or bowel movements that are bloody or black.   Hemorrhoids Hemorrhoids are dilated (enlarged) veins around the rectum. Sometimes clots will form in the veins. This makes them swollen and painful. These are called thrombosed hemorrhoids. Causes of hemorrhoids include:  Constipation.   Straining to have a bowel movement.   HEAVY LIFTING HOME CARE INSTRUCTIONS  Eat a well balanced diet and drink 6 to 8 glasses of water every day to avoid constipation. You may also use a bulk laxative.   Avoid straining to have bowel movements.   Keep anal area dry and clean.   Do not use a  donut shaped pillow or sit on the toilet for long periods. This increases blood pooling and pain.   Move your bowels when your body has the urge; this will require less straining and will decrease pain and pressure.

## 2013-09-04 NOTE — Telephone Encounter (Signed)
REVIEWED.  

## 2013-09-04 NOTE — Progress Notes (Signed)
REVIEWED.  

## 2013-09-04 NOTE — Telephone Encounter (Signed)
Message copied by Rodney Langton on Tue Sep 04, 2013 10:34 AM ------      Message from: Danie Binder      Created: Tue Sep 04, 2013 10:22 AM       GIVENS CAPSULE STUDY NEXT WEEK, DX: TRANSFUSION DEPENDENT ANEMIA/OBSCURE GI BLEED. HOLD IRON FOR 7 DAYS. ------

## 2013-09-04 NOTE — Op Note (Signed)
[facility] [address] [place], [address]   COLONOSCOPY PROCEDURE REPORT  PATIENT: Sonya [name], Sonya [name]  MR#: [id number] BIRTHDATE: [birth year] , 79  yrs. old GENDER: Female ENDOSCOPIST: Barney Drain, MD REFERRED [id number] Gerarda Fraction, M.D. 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 NOW ON ARANESP. MEDICATIONS: Fentanyl 75 mcg IV and Versed 7 mg IV  DESCRIPTION OF PROCEDURE:    Physical exam was performed.  Informed consent was obtained from the patient after explaining the benefits, risks, and alternatives to procedure.  The patient was connected to monitor and placed in left lateral position. Continuous oxygen was provided by nasal cannula and IV medicine administered through an indwelling cannula.  After administration of sedation and rectal exam, the patients rectum was intubated and the [id number]  colonoscope was advanced under direct visualization to the ileum.  The scope was removed slowly by carefully examining the color, texture, anatomy, and integrity mucosa on the way out.  The patient was recovered in endoscopy and discharged home in satisfactory condition.    COLON FINDINGS: The mucosa appeared normal in the terminal ileum.  , A small ulcer was found in the descending colon.  Multiple biopsies were performed using cold forceps.  , A sessile polyp measuring 7 mm in size was found in the descending colon.  A polypectomy was performed using snare cautery.  , There was moderate diverticulosis noted throughout the entire examined colon with associated muscular hypertrophy and colonic narrowing.  , and Large internal hemorrhoids were found.  PREP QUALITY: good.CECAL W/D TIME: 20 minutes     COMPLICATIONS: None  ENDOSCOPIC IMPRESSION: 1.   Normal mucosa in the terminal ileum 2.   Small ulcer in the descending colon 3.   ONE COLON  polyp REMOVED 4.   Moderate diverticulosis noted throughout the entire examined colon 5.   Large internal hemorrhoids  RECOMMENDATIONS: NEXT WEEK GIVENS CAPSULE STUDY TO COMPLETE THE EVALUATION FOR YOUR GI TRACT. HOLD IRON.  MAY RE-START AFTER YOUR GIVENS CAPSULE STUDY IS COMPLETE. HOLD ASA UNTIL WORKUP FOR LOW BLOOD COUNT IS COMPLETE. FOLLOW A HIGH FIBER DIET.  AVOID ITEMS THAT CAUSE BLOATING. BIOPSY RESULTS SHOULD BE BACK IN 7 DAYS.  Next colonoscopy in 10 years.       _______________________________ Lorrin MaisBarney Drain, MD 09/04/2013 10:49 AM     PATIENT NAME:  Sonya [name], Sonya [name] MR#: [id number]

## 2013-09-04 NOTE — Telephone Encounter (Signed)
GIVENS Thursday Feb 12th at 7:30 patient to arrive at 7:00 [facility] Short stay and Instructions are in the mail

## 2013-09-04 NOTE — OR Nursing (Signed)
Dr. Oneida Alar notified of increased BP. 209/107-left arm, 214-111 in right arm, pulse-91. Orders received and carried out.

## 2013-09-04 NOTE — H&P (Signed)
Primary Care Physician:  Sonya [name]., MD Primary Gastroenterologist:  Dr. Oneida Alar  Pre-Procedure History & Physical: HPI:  Sonya [name] is a 63 y.o. female here for  ANEMIA.   Past Medical History  Diagnosis Date  . HTN (hypertension)   . Hypothyroidism   . Uterine cancer 2012  . Anemia   . CKD (chronic kidney disease)   . Dysphagia   . Hyperparathyroidism due to renal insufficiency     Past Surgical History  Procedure Laterality Date  . Cholecystectomy    . Cesarean section    . Laparoscopic total hysterectomy    . Colonoscopy  2000    TICS, [facility]  . Colonoscopy  2003 NUR BRBPR D50 V6    DC/[place] TICS, [facility]  . Abdominal hysterectomy    . Nasal septum surgery    . Esophagogastroduodenoscopy (egd) with esophageal dilation N/A 06/08/2013    Dr. Herson Prichard:Stricture at the gastroesophageal junction/MILD NON-erosive gastritis (inflammation) was found in the gastric antrum; multiple biopsies/NO SOURCE FOR ANEMIA IDETIFIED-MOST LIKELY DUE TO ANEMIA OF CHRONIC DISEASE    Prior to Admission medications   Medication Sig Start Date End Date Taking? Authorizing Provider  allopurinol (ZYLOPRIM) 100 MG tablet Take 300 mg by mouth daily.    Yes Historical Provider, MD  aspirin EC 81 MG tablet Take 81 mg by mouth daily.   Yes Historical Provider, MD  docusate sodium (COLACE) 100 MG capsule Take 100 mg by mouth 2 (two) times daily.   Yes Historical Provider, MD  ferrous sulfate dried (SLOW FE) 160 (50 FE) MG TBCR SR tablet Take 1 tablet (160 mg total) by mouth daily. 08/20/13  Yes Kinnie Feil, MD  hydrALAZINE (APRESOLINE) 50 MG tablet Take 1.5 tablets (75 mg total) by mouth every 8 (eight) hours. 08/20/13  Yes Kinnie Feil, MD  labetalol (NORMODYNE) 300 MG tablet Take 1 tablet (300 mg total) by mouth 3 (three) times daily. 08/20/13  Yes Kinnie Feil, MD  levothyroxine (SYNTHROID, LEVOTHROID) 50 MCG tablet Take 1 tablet (50 mcg total) by mouth daily. 08/20/13  Yes Kinnie Feil, MD   meclizine (ANTIVERT) 25 MG tablet Take 25 mg by mouth 3 (three) times daily as needed for dizziness.   Yes Historical Provider, MD  NIFEdipine (PROCARDIA XL/ADALAT-CC) 90 MG 24 hr tablet Take 1 tablet (90 mg total) by mouth daily. 08/20/13  Yes Kinnie Feil, MD  omeprazole (PRILOSEC) 20 MG capsule Take 20 mg by mouth daily.   Yes Historical Provider, MD  polyethylene glycol-electrolytes (TRILYTE) 420 G solution Take 4,000 mLs by mouth as directed. 09/03/13  Yes Danie Binder, MD  sodium bicarbonate 650 MG tablet Take 1 tablet (650 mg total) by mouth 2 (two) times daily. 06/12/13  Yes Kathie Dike, MD  darbepoetin (ARANESP) 40 MCG/0.4ML SOLN injection Inject 0.4 mLs (40 mcg total) into the skin every 7 (seven) days. 06/27/13   Kathie Dike, MD  Linaclotide (LINZESS) 145 MCG CAPS capsule Take 1 capsule (145 mcg total) by mouth daily. Take 30 minutes prior to breakfast 09/03/13   Orvil Feil, NP  ondansetron (ZOFRAN) 4 MG tablet Take 4 mg by mouth every 8 (eight) hours as needed for nausea or vomiting.    Historical Provider, MD    Allergies as of 09/03/2013 - Review Complete 09/03/2013  Allergen Reaction Noted  . Cephalexin  04/08/2011    Family History  Problem Relation Age of Onset  . Colon cancer Neg Hx   . Cancer Mother   .  Diabetes Mother   . Hypertension Mother   . Diabetes Father   . Hypertension Father   . Hypertension Sister     History   Social History  . Marital Status: Married    Spouse Name: N/A    Number of Children: N/A  . Years of Education: N/A   Occupational History  . Not on file.   Social History Main Topics  . Smoking status: Never Smoker   . Smokeless tobacco: Not on file  . Alcohol Use: No  . Drug Use: No  . Sexual Activity: Not on file   Other Topics Concern  . 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    Review of Systems: See HPI, otherwise negative ROS   Physical Exam: BP 191/82   Pulse 84  Temp(Src) 97.8 F (36.6 C) (Oral)  Resp 17  Ht 5\' 9"  (1.753 m)  Wt 183 lb (83.008 kg)  BMI 27.01 kg/m2  SpO2 100% General:   Alert,  pleasant and cooperative in NAD Head:  Normocephalic and atraumatic. Neck:  Supple; Lungs:  Clear throughout to auscultation.    Heart:  Regular rate and rhythm. Abdomen:  Soft, nontender and nondistended. Normal bowel sounds, without guarding, and without rebound.   Neurologic:  Alert and  oriented x4;  grossly normal neurologically.  Impression/Plan:     Anemia  PLAN: TCS TODAY

## 2013-09-04 NOTE — Progress Notes (Signed)
cc'd to pcp 

## 2013-09-06 ENCOUNTER — Telehealth: Payer: Self-pay | Admitting: Gastroenterology

## 2013-09-06 ENCOUNTER — Encounter (HOSPITAL_COMMUNITY): Payer: Self-pay | Admitting: Gastroenterology

## 2013-09-06 NOTE — Telephone Encounter (Signed)
You had SIMPLE ADENOMA removed. You have internal hemorrhoids and diverticulosis IN YOUR RIGHT AND LEFT COLON. The last part of your small bowel is normal. NO OBVIOUS SOURCE FOR A LOW BLOOD COUNT WAS IDENTIFIED.   COMPLETE THE GIVENS CAPSULE STUDY.   HOLD IRON. YOU MAY RE-START AFTER YOUR GIVENS CAPSULE STUDY IS COMPLETE.  HOLD ASA UNTIL YOU WORKUP FOR LOW BLOOD COUNT IS COMPLETE.  FOLLOW A HIGH FIBER DIET. AVOID ITEMS THAT CAUSE BLOATING.  OPV IN 3 MOS E30 ANEMIA. Next colonoscopy in 10 years.

## 2013-09-07 ENCOUNTER — Telehealth: Payer: Self-pay | Admitting: Gastroenterology

## 2013-09-07 NOTE — Telephone Encounter (Signed)
Called and informed pt.  

## 2013-09-07 NOTE — Telephone Encounter (Signed)
GIVENS HAS BEEN APPROVED BY [employer] PAC# [id number]

## 2013-09-10 NOTE — Telephone Encounter (Signed)
Reminder in EPIC 

## 2013-09-13 ENCOUNTER — Ambulatory Visit (HOSPITAL_COMMUNITY)
Admission: RE | Admit: 2013-09-13 | Payer: BC Managed Care – PPO | Source: Ambulatory Visit | Admitting: Gastroenterology

## 2013-09-13 ENCOUNTER — Other Ambulatory Visit: Payer: Self-pay | Admitting: *Deleted

## 2013-09-13 ENCOUNTER — Telehealth: Payer: Self-pay | Admitting: Gastroenterology

## 2013-09-13 SURGERY — IMAGING PROCEDURE, GI TRACT, INTRALUMINAL, VIA CAPSULE

## 2013-09-13 NOTE — Telephone Encounter (Signed)
Patient called last evening to [facility] and spoke to Nursing Supervisor and cancelled her GIVENS, I gave her a call back this morning and she stated that she went to the kidney Dr. Wilburn Mylar, she has to start Dialysis right away, he legs started cramping up on her and shes just not up to having GIVENS at this time.

## 2013-09-14 ENCOUNTER — Other Ambulatory Visit: Payer: Self-pay

## 2013-09-14 ENCOUNTER — Encounter (HOSPITAL_COMMUNITY): Payer: Self-pay | Admitting: *Deleted

## 2013-09-16 MED ORDER — VANCOMYCIN HCL IN DEXTROSE 1-5 GM/200ML-% IV SOLN
1000.0000 mg | INTRAVENOUS | Status: AC
  Administered 2013-09-17: 1000 mg via INTRAVENOUS
  Filled 2013-09-16: qty 200

## 2013-09-17 ENCOUNTER — Encounter (HOSPITAL_COMMUNITY): Payer: Self-pay | Admitting: *Deleted

## 2013-09-17 ENCOUNTER — Telehealth: Payer: Self-pay | Admitting: Vascular Surgery

## 2013-09-17 ENCOUNTER — Encounter (HOSPITAL_COMMUNITY)
Admission: RE | Disposition: A | Discharge status: Home / Self Care | Payer: Self-pay | Source: Ambulatory Visit | Attending: Vascular Surgery

## 2013-09-17 ENCOUNTER — Ambulatory Visit (HOSPITAL_COMMUNITY)
Admission: RE | Admit: 2013-09-17 | Discharge: 2013-09-17 | Disposition: A | Discharge status: Home / Self Care | Payer: BC Managed Care – PPO | Source: Ambulatory Visit | Attending: Vascular Surgery | Admitting: Vascular Surgery

## 2013-09-17 ENCOUNTER — Ambulatory Visit (HOSPITAL_COMMUNITY): Payer: BC Managed Care – PPO | Admitting: Anesthesiology

## 2013-09-17 ENCOUNTER — Encounter (HOSPITAL_COMMUNITY): Payer: BC Managed Care – PPO | Admitting: Anesthesiology

## 2013-09-17 ENCOUNTER — Ambulatory Visit (HOSPITAL_COMMUNITY): Payer: BC Managed Care – PPO

## 2013-09-17 DIAGNOSIS — I120 Hypertensive chronic kidney disease with stage 5 chronic kidney disease or end stage renal disease: Secondary | ICD-10-CM | POA: Insufficient documentation

## 2013-09-17 DIAGNOSIS — N2581 Secondary hyperparathyroidism of renal origin: Secondary | ICD-10-CM | POA: Insufficient documentation

## 2013-09-17 DIAGNOSIS — Z8542 Personal history of malignant neoplasm of other parts of uterus: Secondary | ICD-10-CM | POA: Insufficient documentation

## 2013-09-17 DIAGNOSIS — E039 Hypothyroidism, unspecified: Secondary | ICD-10-CM | POA: Insufficient documentation

## 2013-09-17 DIAGNOSIS — Z7982 Long term (current) use of aspirin: Secondary | ICD-10-CM | POA: Insufficient documentation

## 2013-09-17 DIAGNOSIS — N186 End stage renal disease: Secondary | ICD-10-CM

## 2013-09-17 DIAGNOSIS — R1310 Dysphagia, unspecified: Secondary | ICD-10-CM | POA: Insufficient documentation

## 2013-09-17 DIAGNOSIS — D649 Anemia, unspecified: Secondary | ICD-10-CM | POA: Insufficient documentation

## 2013-09-17 HISTORY — DX: Diverticulitis of intestine, part unspecified, without perforation or abscess without bleeding: K57.92

## 2013-09-17 HISTORY — PX: INSERTION OF DIALYSIS CATHETER: SHX1324

## 2013-09-17 HISTORY — DX: Gastro-esophageal reflux disease without esophagitis: K21.9

## 2013-09-17 HISTORY — PX: BASCILIC VEIN TRANSPOSITION: SHX5742

## 2013-09-17 LAB — POCT I-STAT 4, (NA,K, GLUC, HGB,HCT)
Glucose, Bld: 107 mg/dL — ABNORMAL HIGH (ref 70–99)
HEMATOCRIT: 27 % — AB (ref 36.0–46.0)
Hemoglobin: 9.2 g/dL — ABNORMAL LOW (ref 12.0–15.0)
Potassium: 3.6 mEq/L — ABNORMAL LOW (ref 3.7–5.3)
Sodium: 144 mEq/L (ref 137–147)

## 2013-09-17 SURGERY — TRANSPOSITION, VEIN, BASILIC
Anesthesia: General | Site: Neck | Laterality: Right

## 2013-09-17 MED ORDER — FENTANYL CITRATE 0.05 MG/ML IJ SOLN
INTRAMUSCULAR | Status: DC | PRN
  Administered 2013-09-17: 75 ug via INTRAVENOUS
  Administered 2013-09-17 (×2): 25 ug via INTRAVENOUS

## 2013-09-17 MED ORDER — OXYCODONE HCL 5 MG PO TABS
ORAL_TABLET | ORAL | Status: AC
  Filled 2013-09-17: qty 1

## 2013-09-17 MED ORDER — EPHEDRINE SULFATE 50 MG/ML IJ SOLN
INTRAMUSCULAR | Status: DC | PRN
  Administered 2013-09-17: 15 mg via INTRAVENOUS

## 2013-09-17 MED ORDER — SODIUM CHLORIDE 0.9 % IV SOLN
INTRAVENOUS | Status: DC
  Administered 2013-09-17: 08:00:00 via INTRAVENOUS

## 2013-09-17 MED ORDER — 0.9 % SODIUM CHLORIDE (POUR BTL) OPTIME
TOPICAL | Status: DC | PRN
  Administered 2013-09-17: 1000 mL

## 2013-09-17 MED ORDER — SODIUM CHLORIDE 0.9 % IV SOLN
INTRAVENOUS | Status: DC | PRN
  Administered 2013-09-17: 10:00:00 via INTRAVENOUS

## 2013-09-17 MED ORDER — PROPOFOL 10 MG/ML IV BOLUS
INTRAVENOUS | Status: AC
  Filled 2013-09-17: qty 60

## 2013-09-17 MED ORDER — OXYCODONE-ACETAMINOPHEN 5-325 MG PO TABS: 1.0000 | ORAL_TABLET | Freq: Four times a day (QID) | ORAL | Status: DC | PRN

## 2013-09-17 MED ORDER — HEPARIN SODIUM (PORCINE) 1000 UNIT/ML IJ SOLN
INTRAMUSCULAR | Status: AC
  Filled 2013-09-17: qty 1

## 2013-09-17 MED ORDER — HYDROMORPHONE HCL PF 1 MG/ML IJ SOLN
INTRAMUSCULAR | Status: AC
  Filled 2013-09-17: qty 1

## 2013-09-17 MED ORDER — ONDANSETRON HCL 4 MG/2ML IJ SOLN
INTRAMUSCULAR | Status: AC
  Filled 2013-09-17: qty 2

## 2013-09-17 MED ORDER — LIDOCAINE HCL (CARDIAC) 20 MG/ML IV SOLN
INTRAVENOUS | Status: DC | PRN
  Administered 2013-09-17: 20 mg via INTRAVENOUS

## 2013-09-17 MED ORDER — HEPARIN SODIUM (PORCINE) 1000 UNIT/ML IJ SOLN
INTRAMUSCULAR | Status: DC | PRN
  Administered 2013-09-17: 5000 [IU]

## 2013-09-17 MED ORDER — MIDAZOLAM HCL 5 MG/5ML IJ SOLN
INTRAMUSCULAR | Status: DC | PRN
  Administered 2013-09-17: 2 mg via INTRAVENOUS

## 2013-09-17 MED ORDER — OXYCODONE HCL 5 MG/5ML PO SOLN: 5.0000 mg | Freq: Once | ORAL | Status: AC | PRN

## 2013-09-17 MED ORDER — PROPOFOL 10 MG/ML IV BOLUS
INTRAVENOUS | Status: DC | PRN
  Administered 2013-09-17: 170 mg via INTRAVENOUS

## 2013-09-17 MED ORDER — PHENYLEPHRINE 40 MCG/ML (10ML) SYRINGE FOR IV PUSH (FOR BLOOD PRESSURE SUPPORT)
PREFILLED_SYRINGE | INTRAVENOUS | Status: AC
  Filled 2013-09-17: qty 10

## 2013-09-17 MED ORDER — SODIUM CHLORIDE 0.9 % IR SOLN
Status: DC | PRN
  Administered 2013-09-17: 12:00:00

## 2013-09-17 MED ORDER — LIDOCAINE HCL (CARDIAC) 20 MG/ML IV SOLN
INTRAVENOUS | Status: AC
  Filled 2013-09-17: qty 5

## 2013-09-17 MED ORDER — OXYCODONE HCL 5 MG PO TABS
5.0000 mg | ORAL_TABLET | Freq: Once | ORAL | Status: AC | PRN
  Administered 2013-09-17: 5 mg via ORAL

## 2013-09-17 MED ORDER — MIDAZOLAM HCL 2 MG/2ML IJ SOLN
INTRAMUSCULAR | Status: AC
  Filled 2013-09-17: qty 2

## 2013-09-17 MED ORDER — ONDANSETRON HCL 4 MG/2ML IJ SOLN
4.0000 mg | Freq: Once | INTRAMUSCULAR | Status: AC | PRN
  Administered 2013-09-17: 4 mg via INTRAVENOUS

## 2013-09-17 MED ORDER — LIDOCAINE-EPINEPHRINE 0.5 %-1:200000 IJ SOLN
INTRAMUSCULAR | Status: DC | PRN
  Administered 2013-09-17: 7 mL

## 2013-09-17 MED ORDER — FENTANYL CITRATE 0.05 MG/ML IJ SOLN
INTRAMUSCULAR | Status: AC
  Filled 2013-09-17: qty 5

## 2013-09-17 MED ORDER — HYDROMORPHONE HCL PF 1 MG/ML IJ SOLN
0.2500 mg | INTRAMUSCULAR | Status: DC | PRN
  Administered 2013-09-17: 0.5 mg via INTRAVENOUS
  Administered 2013-09-17: 5 mg via INTRAVENOUS

## 2013-09-17 MED ORDER — THROMBIN 20000 UNITS EX SOLR
CUTANEOUS | Status: AC
  Filled 2013-09-17: qty 20000

## 2013-09-17 SURGICAL SUPPLY — 66 items
ADH SKN CLS APL DERMABOND .7 (GAUZE/BANDAGES/DRESSINGS) ×4
APL SKNCLS STERI-STRIP NONHPOA (GAUZE/BANDAGES/DRESSINGS) ×2
BAG BANDED W/RUBBER/TAPE 36X54 (MISCELLANEOUS) ×3 IMPLANT
BAG DECANTER FOR FLEXI CONT (MISCELLANEOUS) ×7 IMPLANT
BAG EQP BAND 135X91 W/RBR TAPE (MISCELLANEOUS) ×2
BENZOIN TINCTURE PRP APPL 2/3 (GAUZE/BANDAGES/DRESSINGS) ×4 IMPLANT
CANISTER SUCTION 2500CC (MISCELLANEOUS) ×4 IMPLANT
CATH BEACON 5.038 65CM KMP-01 (CATHETERS) ×4 IMPLANT
CATH CANNON HEMO 15F 50CM (CATHETERS) IMPLANT
CATH CANNON HEMO 15FR 19 (HEMODIALYSIS SUPPLIES) IMPLANT
CATH CANNON HEMO 15FR 23CM (HEMODIALYSIS SUPPLIES) IMPLANT
CATH CANNON HEMO 15FR 31CM (HEMODIALYSIS SUPPLIES) IMPLANT
CATH CANNON HEMO 15FR 32 (HEMODIALYSIS SUPPLIES) IMPLANT
CATH CANNON HEMO 15FR 32CM (HEMODIALYSIS SUPPLIES) ×4 IMPLANT
CLIP LIGATING EXTRA MED SLVR (CLIP) ×4 IMPLANT
CLIP LIGATING EXTRA SM BLUE (MISCELLANEOUS) ×4 IMPLANT
CLOSURE WOUND 1/2 X4 (GAUZE/BANDAGES/DRESSINGS) ×1
COVER DOME SNAP 22 D (MISCELLANEOUS) ×4 IMPLANT
COVER PROBE W GEL 5X96 (DRAPES) ×4 IMPLANT
COVER SURGICAL LIGHT HANDLE (MISCELLANEOUS) ×4 IMPLANT
DECANTER SPIKE VIAL GLASS SM (MISCELLANEOUS) ×4 IMPLANT
DERMABOND ADVANCED (GAUZE/BANDAGES/DRESSINGS) ×4
DERMABOND ADVANCED .7 DNX12 (GAUZE/BANDAGES/DRESSINGS) ×2 IMPLANT
DRAPE C-ARM 42X72 X-RAY (DRAPES) IMPLANT
DRAPE CHEST BREAST 15X10 FENES (DRAPES) ×4 IMPLANT
ELECT REM PT RETURN 9FT ADLT (ELECTROSURGICAL) ×4
ELECTRODE REM PT RTRN 9FT ADLT (ELECTROSURGICAL) ×2 IMPLANT
GAUZE SPONGE 2X2 8PLY STRL LF (GAUZE/BANDAGES/DRESSINGS) ×2 IMPLANT
GAUZE SPONGE 4X4 16PLY XRAY LF (GAUZE/BANDAGES/DRESSINGS) ×4 IMPLANT
GEL ULTRASOUND 20GR AQUASONIC (MISCELLANEOUS) IMPLANT
GLOVE SS BIOGEL STRL SZ 7.5 (GLOVE) ×2 IMPLANT
GLOVE SUPERSENSE BIOGEL SZ 7.5 (GLOVE) ×2
GOWN STRL REUS W/ TWL LRG LVL3 (GOWN DISPOSABLE) ×6 IMPLANT
GOWN STRL REUS W/TWL LRG LVL3 (GOWN DISPOSABLE) ×12
GUIDEWIRE AMPLATZ STIFF 0.35 (WIRE) ×3 IMPLANT
KIT BASIN OR (CUSTOM PROCEDURE TRAY) ×4 IMPLANT
KIT ROOM TURNOVER OR (KITS) ×4 IMPLANT
NDL 18GX1X1/2 (RX/OR ONLY) (NEEDLE) ×1 IMPLANT
NEEDLE 18GX1X1/2 (RX/OR ONLY) (NEEDLE) ×4 IMPLANT
NEEDLE 22X1 1/2 (OR ONLY) (NEEDLE) ×4 IMPLANT
NEEDLE HYPO 25GX1X1/2 BEV (NEEDLE) ×4 IMPLANT
NS IRRIG 1000ML POUR BTL (IV SOLUTION) ×4 IMPLANT
PACK CV ACCESS (CUSTOM PROCEDURE TRAY) ×4 IMPLANT
PACK SURGICAL SETUP 50X90 (CUSTOM PROCEDURE TRAY) ×4 IMPLANT
PAD ARMBOARD 7.5X6 YLW CONV (MISCELLANEOUS) ×8 IMPLANT
SOAP 2 % CHG 4 OZ (WOUND CARE) ×4 IMPLANT
SPONGE GAUZE 2X2 STER 10/PKG (GAUZE/BANDAGES/DRESSINGS) ×2
SPONGE GAUZE 4X4 12PLY (GAUZE/BANDAGES/DRESSINGS) ×4 IMPLANT
STRIP CLOSURE SKIN 1/2X4 (GAUZE/BANDAGES/DRESSINGS) ×3 IMPLANT
SUT ETHILON 3 0 PS 1 (SUTURE) ×4 IMPLANT
SUT PROLENE 6 0 BV (SUTURE) ×8 IMPLANT
SUT PROLENE 6 0 CC (SUTURE) ×4 IMPLANT
SUT SILK 2 0 SH (SUTURE) ×4 IMPLANT
SUT VIC AB 3-0 SH 27 (SUTURE) ×4
SUT VIC AB 3-0 SH 27X BRD (SUTURE) ×2 IMPLANT
SUT VICRYL 4-0 PS2 18IN ABS (SUTURE) ×7 IMPLANT
SYR 20CC LL (SYRINGE) ×4 IMPLANT
SYR 30ML LL (SYRINGE) IMPLANT
SYR 5ML LL (SYRINGE) ×8 IMPLANT
SYR CONTROL 10ML LL (SYRINGE) ×4 IMPLANT
SYRINGE 10CC LL (SYRINGE) ×4 IMPLANT
TAPE CLOTH SURG 4X10 WHT LF (GAUZE/BANDAGES/DRESSINGS) ×4 IMPLANT
TOWEL OR 17X24 6PK STRL BLUE (TOWEL DISPOSABLE) ×4 IMPLANT
TOWEL OR 17X26 10 PK STRL BLUE (TOWEL DISPOSABLE) ×4 IMPLANT
UNDERPAD 30X30 INCONTINENT (UNDERPADS AND DIAPERS) ×4 IMPLANT
WATER STERILE IRR 1000ML POUR (IV SOLUTION) ×4 IMPLANT

## 2013-09-17 NOTE — Interval H&P Note (Signed)
Addendum  Clarification,  patient is scheduled for a staged R brachial vein transposition.   Adele Barthel, MD [facility] Office: [phone] Pager: [phone]  09/17/2013, 9:26 AM

## 2013-09-17 NOTE — Anesthesia Preprocedure Evaluation (Signed)
Anesthesia Evaluation  Patient identified by MRN, date of birth, ID band Patient awake    Reviewed: Allergy & Precautions, H&P , NPO status , Patient's Chart, lab work & pertinent test results  Airway Mallampati: II TM Distance: >3 FB Neck ROM: Full    Dental  (+) Edentulous Upper, Edentulous Lower   Pulmonary  breath sounds clear to auscultation        Cardiovascular hypertension, Rhythm:Regular Rate:Normal     Neuro/Psych    GI/Hepatic   Endo/Other    Renal/GU      Musculoskeletal   Abdominal   Peds  Hematology   Anesthesia Other Findings   Reproductive/Obstetrics                           Anesthesia Physical Anesthesia Plan  ASA: III  Anesthesia Plan: General   Post-op Pain Management:    Induction: Intravenous  Airway Management Planned: LMA  Additional Equipment:   Intra-op Plan:   Post-operative Plan:   Informed Consent: I have reviewed the patients History and Physical, chart, labs and discussed the procedure including the risks, benefits and alternatives for the proposed anesthesia with the patient or authorized representative who has indicated his/her understanding and acceptance.   Dental advisory given  Plan Discussed with: CRNA and Anesthesiologist  Anesthesia Plan Comments: (ESRD K- 3.6 Chronic anemia Hct 27.0 Hypertension  Plan GA with LMA  Roberts Gaudy, MD)        Anesthesia Quick Evaluation

## 2013-09-17 NOTE — Discharge Instructions (Signed)

## 2013-09-17 NOTE — Anesthesia Procedure Notes (Signed)
Procedure Name: LMA Insertion Date/Time: 09/17/2013 10:08 AM Performed by: Trixie Deis A Pre-anesthesia Checklist: Patient identified, Emergency Drugs available, Suction available, Patient being monitored and Timeout performed Patient Re-evaluated:Patient Re-evaluated prior to inductionOxygen Delivery Method: Circle system utilized Preoxygenation: Pre-oxygenation with 100% oxygen Intubation Type: IV induction Ventilation: Oral airway inserted - appropriate to patient size and Mask ventilation without difficulty LMA: LMA inserted LMA Size: 4.0 Number of attempts: 1 Placement Confirmation: positive ETCO2 and breath sounds checked- equal and bilateral Tube secured with: Tape Dental Injury: Teeth and Oropharynx as per pre-operative assessment

## 2013-09-17 NOTE — Anesthesia Postprocedure Evaluation (Signed)
  Anesthesia Post-op Note  Patient: Sonya [name]  Procedure(s) Performed: Procedure(s): BRACHIAL VEIN TRANSPOSITION (Right) INSERTION OF DIALYSIS CATHETER (Left)  Patient Location: PACU  Anesthesia Type:General  Level of Consciousness: awake, alert  and oriented  Airway and Oxygen Therapy: Patient Spontanous Breathing  Post-op Pain: mild  Post-op Assessment: Post-op Vital signs reviewed, Patient's Cardiovascular Status Stable, Respiratory Function Stable, Patent Airway and Pain level controlled  Post-op Vital Signs: stable  Complications: No apparent anesthesia complications

## 2013-09-17 NOTE — Telephone Encounter (Signed)
Noted. Let's have her return to our office in about 4-6 weeks to see how she is doing, discuss scheduling givens again.

## 2013-09-17 NOTE — Progress Notes (Signed)
Dr Bridgett Larsson and PA called to bedside, pt complaining of numbness in hand,  Noted +2 pulses, with brisk capillary refill in fingers.   Patient also complaining of 10/10 pain at incision site

## 2013-09-17 NOTE — Progress Notes (Signed)
   Asked to check on patient who is complaining of 10/10 pain and some numbness in right hand.  On exam, strong thrill with warm hand with 4/5 hand grip (pain limits grip), intact sensation in right hand, and easily palpable radial pulse.  Pt does not have ischemic monomelic neuropathy on exam.  I don't have high suspicion for steal syndrome at this point.   I had a discussion with the patient in regards to what to expect from the fistula.  She will contact us if her sx worsen.    Adele Barthel, MD [facility] [place] Office: [phone] Pager: [phone]  09/17/2013, 2:42 PM

## 2013-09-17 NOTE — Telephone Encounter (Addendum)
Message copied by Gena Fray on Mon Sep 17, 2013  3:27 PM ------      Message from: Peter Minium K      Created: Mon Sep 17, 2013  2:44 PM      Regarding: Schedule                   ----- Message -----         From: Conrad Elkhart, MD         Sent: 09/17/2013  11:49 AM           To: [address]            SHAMS URENA      [id number]      [birth year]            Procedure:       1.  LIJV TDC placement      2.  LIJV cann with ultrasound guidance      3.  Right first stage brachial vein transposition             Asst: Gerri Lins, PAC             Follow-up: 4 weeks ------  09/17/13: lm for pt re appt, dpm

## 2013-09-17 NOTE — Transfer of Care (Signed)
Immediate Anesthesia Transfer of Care Note  Patient: Sonya [name]  Procedure(s) Performed: Procedure(s): BASCILIC VEIN TRANSPOSITION (Right) INSERTION OF DIALYSIS CATHETER (N/A)  Patient Location: PACU  Anesthesia Type:General  Level of Consciousness: awake, alert  and oriented  Airway & Oxygen Therapy: Patient Spontanous Breathing and Patient connected to nasal cannula oxygen  Post-op Assessment: Report given to PACU RN, Post -op Vital signs reviewed and stable and Patient moving all extremities  Post vital signs: Reviewed and stable  Complications: No apparent anesthesia complications

## 2013-09-17 NOTE — Op Note (Signed)
OPERATIVE NOTE  PROCEDURE: 1. Left internal jugular vein tunneled dialysis catheter placement 2. Left internal jugular vein cannulation under ultrasound guidance 3. Right first stage brachial vein transposition   PRE-OPERATIVE DIAGNOSIS: end-stage renal failure  POST-OPERATIVE DIAGNOSIS: same as above  SURGEON: Adele Barthel, MD  ANESTHESIA: general  ESTIMATED BLOOD LOSS: 30 cc  FINDING(S): 1.  Tips of the catheter in the right atrium on fluoroscopy 2.  No obvious pneumothorax on fluoroscopy 3.  Palpable thrill at end of case 4.  Palpable radial signal at end of case  SPECIMEN(S):  none  INDICATIONS:   Sonya [name] is a 63 y.o. female who presents with end stage renal disease.  The patient presents for right first stage brachial vein transposition and tunneled dialysis catheter placement.  The patient is aware the risks of tunneled dialysis catheter placement include but are not limited to: bleeding, infection, central venous injury, pneumothorax, possible venous stenosis, possible malpositioning in the venous system, and possible infections related to long-term catheter presence.  Risk, benefits, and alternatives to access surgery were discussed.  The patient is aware the risks include but are not limited to: bleeding, infection, steal syndrome, nerve damage, ischemic monomelic neuropathy, failure to mature, need for additional procedures, death and stroke.  The patient was aware of these risks and agreed to proceed.  DESCRIPTION: After written full informed consent was obtained from the patient, the patient was taken back to the operating room.  Prior to induction, the patient was given IV antibiotics.  After obtaining adequate sedation, the patient was prepped and draped in the standard fashion for a chest or neck tunneled dialysis catheter placement. Under ultrasound guidance, the left internal jugular vein was cannulated with the 18 gauge needle.  A J-wire was then placed down  in the superior vena cava but would not advance any further.  i placed a KMP catheter over the wire and redirected the wire into the inferior vena cava under fluroscopic guidance.  The wire was then secured in place with a clamp to the drapes.  I then made stab incisions at the neck and exit sites.  I dissected from the exit site to the cannulation site with a metal tunneler.   The subcutaneous tunnel was dilated by passing a plastic dilator over the metal dissector.  The wire was then unclamped and I exchanged the wire for an Amplatz super stiff wire.  The catheter was then removed.  Over the wire, the skin tract and venotomy was dilated serially with dilators.  Finally, the dilator-sheath was placed over the wire under fluroscopic guidance into the superior vena cava.  The dilator was removed.  A 27 cm Diatek catheter was woven over the wire and advanced under fluoroscopic guidance down into the right atrium.  The wire was then removed, and the sheath was broken and peeled away while holding the catheter cuff at the level of the skin.  The back end of this catheter was transected, revealing the two lumens of this catheter.  The ports were docked onto these two lumens.  The catheter hub was then screwed into place.  Each port was tested by aspirating and flushing.  No resistance was noted.  Each port was then thoroughly flushed with heparinized saline.  The catheter was secured in placed with two interrupted stitches of 3-0 Nylon tied to the catheter.  The neck incision was closed with a U-stitch of 4-0 Monocryl.  The neck and chest incisions were cleaned and sterile  bandages applied.  Each port was then loaded with concentrated heparin (1000 Units/mL) at the manufacturer recommended volumes to each port.  Sterile caps were applied to each port.  On completion fluoroscopy, the tips of the catheter were in the right atrium, and there was no evidence of pneumothorax.  At this point, the drapes were taken down.    The patient was then prepped and draped in the standard fashion for a right arm access procedure.  I turned my attention first to identifying the patient's brachial vein and brachial artery.  Using SonoSite guidance, the location of these vessels were marked out on the skin.   I made a longitudinal incision at the level of the antecubitum and dissected through the subcutaneous tissue and fascia to gain exposure of the brachial artery.  This was noted to be 4 mm in diameter externally.  This was dissected out proximally and distally and controlled with vessel loops .  I then dissected out the brachial vein.  This was noted to be 4 mm in diameter externally.  The distal segment of the vein was ligated with a  2-0 silk, and the vein was transected.  The proximal segment was iinterrogated with serial dilators.  The vein accepted up to a 4 mm dilator without any difficulty.  I then instilled the heparinized saline into the vein and clamped it.  At this point, I reset my exposure of the brachial artery and placed the artery under tension proximally and distally.  I made an arteriotomy with a #11 blade, and then I extended the arteriotomy with a Potts scissor.  I injected heparinized saline proximal and distal to this arteriotomy.  The vein was then sewn to the artery in an end-to-side configuration with a running stitch of 7-0 Prolene.  Prior to completing this anastomosis, I allowed the vein and artery to backbleed.  There was no evidence of clot from any vessels.  I completed the anastomosis in the usual fashion and then released all vessel loops and clamps.  There was a palpable thrill in the venous outflow, and there was palpable radial pulse.  At this point, I irrigated out the surgical wound.  There was no further active bleeding.  The subcutaneous tissue was reapproximated with a running stitch of 3-0 Vicryl.  The skin was then reapproximated with a running subcuticular stitch of 4-0 Vicryl.  The skin was then  cleaned, dried, and reinforced with Dermabond.  The patient tolerated this procedure well.   COMPLICATIONS: none  CONDITION: stable   Adele Barthel, MD [facility] [place] Office: [phone] Pager: [phone]  09/17/2013, 10:47 AM

## 2013-09-17 NOTE — H&P (View-Only) (Signed)
Referred by:  Redmond School, MD [id number] [place], [place] [address]  Reason for referral: New access  History of Present Illness  Sonya [name] is a 63 y.o. (October 09, 1950) female who presents for evaluation for permanent access.  The patient is left hand dominant.  The patient has not had previous access procedures.  Previous central venous cannulation procedures include: none.  The patient has never had a PPM placed.  Per pt, her GFR is 11.  Her CKD is thought due to HTN.  She has not undergone HD education yet and has not fully decided to proceed with HD.  Past Medical History  Diagnosis Date  . HTN (hypertension)   . Hypothyroidism   . Uterine cancer 2012  . Anemia   . CKD (chronic kidney disease)   . Dysphagia   . Hyperparathyroidism due to renal insufficiency     Past Surgical History  Procedure Laterality Date  . Cholecystectomy    . Cesarean section    . Laparoscopic total hysterectomy    . Colonoscopy  2000    TICS, [facility]  . Colonoscopy  2003 NUR BRBPR D50 V6    DC/[facility] TICS, [facility]  . Abdominal hysterectomy    . Nasal septum surgery    . Esophagogastroduodenoscopy (egd) with esophageal dilation N/A 06/08/2013    [id number] at the gastroesophageal junction/MILD NON-erosive gastritis (inflammation) was found in the gastric antrum; multiple biopsies/NO SOURCE FOR ANEMIA IDETIFIED-MOST LIKELY DUE TO ANEMIA OF CHRONIC DISEASE    History   Social History  . Marital Status: Married    Spouse Name: N/A    Number of Children: N/A  . Years of Education: N/A   Occupational History  . Not on file.   Social History Main Topics  . Smoking status: Never Smoker   . Smokeless tobacco: Not on file  . Alcohol Use: No  . Drug Use: No  . Sexual Activity: Not on file   Other Topics Concern  . 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    Family History  Problem Relation Age of Onset  . Colon cancer  Neg Hx   . Cancer Mother   . Diabetes Mother   . Hypertension Mother   . Diabetes Father   . Hypertension Father   . Hypertension Sister      Current Outpatient Prescriptions on File Prior to Visit  Medication Sig Dispense Refill  . allopurinol (ZYLOPRIM) 100 MG tablet Take 300 mg by mouth daily.       Marland Kitchen aspirin EC 81 MG tablet Take 81 mg by mouth daily.      . darbepoetin (ARANESP) 40 MCG/0.4ML SOLN injection Inject 0.4 mLs (40 mcg total) into the skin every 7 (seven) days.  8.4 mL    . docusate sodium (COLACE) 100 MG capsule Take 100 mg by mouth 2 (two) times daily.      . ferrous sulfate dried (SLOW FE) 160 (50 FE) MG TBCR SR tablet Take 1 tablet (160 mg total) by mouth daily.  60 tablet  3  . hydrALAZINE (APRESOLINE) 50 MG tablet Take 1.5 tablets (75 mg total) by mouth every 8 (eight) hours.  90 tablet  1  . labetalol (NORMODYNE) 300 MG tablet Take 1 tablet (300 mg total) by mouth 3 (three) times daily.  90 tablet  1  . levothyroxine (SYNTHROID, LEVOTHROID) 50 MCG tablet Take 1 tablet (50 mcg  total) by mouth daily.  30 tablet  2  . meclizine (ANTIVERT) 25 MG tablet Take 25 mg by mouth 3 (three) times daily as needed for dizziness.      Marland Kitchen NIFEdipine (PROCARDIA XL/ADALAT-CC) 90 MG 24 hr tablet Take 1 tablet (90 mg total) by mouth daily.  30 tablet  1  . omeprazole (PRILOSEC) 20 MG capsule Take 20 mg by mouth daily.      . sodium bicarbonate 650 MG tablet Take 1 tablet (650 mg total) by mouth 2 (two) times daily.  60 tablet  1   No current facility-administered medications on file prior to visit.    Allergies  Allergen Reactions  . Cephalexin    REVIEW OF SYSTEMS:  (Positives checked otherwise negative)  CARDIOVASCULAR:  []  chest pain, []  chest pressure, []  palpitations, []  shortness of breath when laying flat, []  shortness of breath with exertion,  []  pain in feet when walking, []  pain in feet when laying flat, []  history of blood clot in veins (DVT), []  history of phlebitis, []   swelling in legs, []  varicose veins  PULMONARY:  []  productive cough, []  asthma, []  wheezing  NEUROLOGIC:  [x]  weakness in arms or legs, []  numbness in arms or legs, []  difficulty speaking or slurred speech, []  temporary loss of vision in one eye, [x]  dizziness  HEMATOLOGIC:  []  bleeding problems, []  problems with blood clotting too easily  MUSCULOSKEL:  []  joint pain, []  joint swelling  GASTROINTEST:  []  vomiting blood, []  blood in stool     GENITOURINARY:  []  burning with urination, []  blood in urine  PSYCHIATRIC:  []  history of major depression  INTEGUMENTARY:  []  rashes, []  ulcers  CONSTITUTIONAL:  []  fever, [x]  chills  For VQI Use Only  PRE-ADM LIVING: Home  AMB STATUS: Ambulatory  CAD Sx: None  PRIOR CHF: None  STRESS TEST: [x]  No, [ ]  Normal, [ ]  + ischemia, [ ]  + MI, [ ]  Both   Physical Examination  Filed Vitals:   08/31/13 1315  BP: 167/80  Pulse: 76  Height: 5\' 9"  (1.753 m)  Weight: 186 lb 9.6 oz (84.641 kg)  SpO2: 100%   Body mass index is 27.54 kg/(m^2).  General: A&O x 3, WD  Head: [place]/AT  Ear/Nose/Throat: Hearing grossly intact, nares w/o erythema or drainage, oropharynx w/o Erythema/Exudate, Mallampati score: 3  Eyes: PERRLA, EOMI  Neck: Supple, no nuchal rigidity, no palpable LAD  Pulmonary: Sym exp, good air movt, CTAB, no rales, rhonchi, & wheezing  Cardiac: RRR, Nl S1, S2, no Murmurs, rubs or gallops  Vascular: Vessel Right Left  Radial Palpable Palpable  Ulnar Not Palpable Not Palpable  Brachial Palpable Palpable  Carotid Palpable, without bruit Palpable, without bruit  Aorta Not palpable N/A  Femoral Palpable Palpable  Popliteal Not palpable Not palpable  PT Faintly Palpable Faintly Palpable  DP Not Palpable Not Palpable   Gastrointestinal: soft, NTND, -G/R, - HSM, - masses, - CVAT B  Musculoskeletal: M/S 5/5 throughout , Extremities without ischemic changes , On Sonosite: 4-6 mm brachial vein adjacent to brachial  artery  Neurologic: CN 2-12 intact , Pain and light touch intact in extremities , Motor exam as listed above  Psychiatric: Judgment intact, Mood & affect appropriate for pt's clinical situation  Dermatologic: See M/S exam for extremity exam, no rashes otherwise noted  Lymph : No Cervical, Axillary, or Inguinal lymphadenopathy   Non-Invasive Vascular Imaging  Vein Mapping  (Date: 08/31/2013):   R arm: acceptable vein conduits  include none  L arm: acceptable vein conduits include none  Outside Studies/Documentation 4 pages of outside documents were reviewed including: outside.  Medical Decision Making  Sonya [name] is a 63 y.o. female who presents with chronic kidney disease stage V   Based on vein mapping and examination, this patient's permanent access options include: R staged BRVT vs R FA AVG.  I had an extensive discussion with this patient in regards to the nature of access surgery, including risk, benefits, and alternatives.    The patient is aware that the risks of access surgery include but are not limited to: bleeding, infection, steal syndrome, nerve damage, ischemic monomelic neuropathy, failure of access to mature, and possible need for additional access procedures in the future. I discussed with the patient the nature of the staged access procedure, specifically the need for a second operation to transpose the first stage fistula if it matures adequately.    The patient has not agreed to proceed with the above procedure.  She will let us know when and if she is ready to proceed.  I discussed with the patient likely delay in available access as a staged transposition is needed.  Adele Barthel, MD [facility] Office: [phone] Pager: [phone]  08/31/2013, 2:46 PM

## 2013-09-20 NOTE — Telephone Encounter (Signed)
appointment made on  March 12@ 1000 with AS and letter mailed

## 2013-09-21 ENCOUNTER — Encounter (HOSPITAL_COMMUNITY): Payer: Self-pay | Admitting: Vascular Surgery

## 2013-10-11 ENCOUNTER — Ambulatory Visit: Payer: BC Managed Care – PPO | Admitting: Gastroenterology

## 2013-10-18 ENCOUNTER — Encounter: Payer: Self-pay | Admitting: Vascular Surgery

## 2013-10-19 ENCOUNTER — Ambulatory Visit (INDEPENDENT_AMBULATORY_CARE_PROVIDER_SITE_OTHER): Payer: BC Managed Care – PPO | Admitting: Vascular Surgery

## 2013-10-19 ENCOUNTER — Encounter: Payer: Self-pay | Admitting: Vascular Surgery

## 2013-10-19 VITALS — BP 173/70 | HR 83 | Ht 69.0 in | Wt 180.6 lb

## 2013-10-19 DIAGNOSIS — N186 End stage renal disease: Secondary | ICD-10-CM

## 2013-10-19 NOTE — Progress Notes (Signed)
Postoperative Access Visit   History of Present Illness  Sonya [name] is a 63 y.o. year old female who presents for postoperative follow-up for: R 1st BRVT (Date: 09/17/13).  The patient's wounds are healed.  The patient notes no steal symptoms.  The patient is able to complete their activities of daily living.  The patient's current symptoms are: none.  Pt cont. HD via [facility] TDC.  Past Medical History  Diagnosis Date  . HTN (hypertension)   . Hypothyroidism   . Uterine cancer 2012  . Anemia   . CKD (chronic kidney disease)   . Dysphagia   . Hyperparathyroidism due to renal insufficiency   . GERD (gastroesophageal reflux disease)   . Diverticulitis     Past Surgical History  Procedure Laterality Date  . Cholecystectomy    . Cesarean section    . Laparoscopic total hysterectomy    . Colonoscopy  2000    TICS, I[facility]  . Colonoscopy  2003 NUR BRBPR D50 V6    DC/[place] TICS, IH  . Abdominal hysterectomy    . Nasal septum surgery    . Esophagogastroduodenoscopy (egd) with esophageal dilation N/A 06/08/2013    Dr. Fields:Stricture at the gastroesophageal junction/MILD NON-erosive gastritis (inflammation) was found in the gastric antrum; multiple biopsies/NO SOURCE FOR ANEMIA IDETIFIED-MOST LIKELY DUE TO ANEMIA OF CHRONIC DISEASE  . Colonoscopy N/A 09/04/2013    Procedure: COLONOSCOPY;  Surgeon: Danie Binder, MD;  [facility];  Service: Endoscopy;  Laterality: N/A;  10:30AM-moved to 9:25 [name]  . Appendectomy    . Bascilic vein transposition Right 09/17/2013    Procedure: BRACHIAL VEIN TRANSPOSITION;  Surgeon: Conrad Ackley, MD;  Location: [facility];  Service: Vascular;  Laterality: Right;  . Insertion of dialysis catheter Left 09/17/2013    Procedure: INSERTION OF DIALYSIS CATHETER;  Surgeon: Conrad Sidney, MD;  Location: [facility];  Service: Vascular;  Laterality: Left;    History   Social History  . Marital Status: Married    Spouse Name: N/A    Number of  Children: N/A  . Years of Education: N/A   Occupational History  . Not on file.   Social History Main Topics  . Smoking status: Never Smoker   . Smokeless tobacco: Never Used  . Alcohol Use: No  . Drug Use: No  . Sexual Activity: Not on file   Other Topics Concern  . 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    Family History  Problem Relation Age of Onset  . Colon cancer Neg Hx   . Cancer Mother   . Diabetes Mother   . Hypertension Mother   . Diabetes Father   . Hypertension Father   . Hypertension Sister      Current Outpatient Prescriptions on File Prior to Visit  Medication Sig Dispense Refill  . allopurinol (ZYLOPRIM) 100 MG tablet Take 300 mg by mouth daily.       . darbepoetin (ARANESP) 40 MCG/0.4ML SOLN injection Inject 0.4 mLs (40 mcg total) into the skin every 7 (seven) days.  8.4 mL    . docusate sodium (COLACE) 100 MG capsule Take 100 mg by mouth 2 (two) times daily.      . hydrALAZINE (APRESOLINE) 50 MG tablet Take 1.5 tablets (75 mg total) by mouth every 8 (eight) hours.  90 tablet  1  . labetalol (NORMODYNE) 300 MG  tablet Take 1 tablet (300 mg total) by mouth 3 (three) times daily.  90 tablet  1  . levothyroxine (SYNTHROID, LEVOTHROID) 50 MCG tablet Take 1 tablet (50 mcg total) by mouth daily.  30 tablet  2  . Linaclotide (LINZESS) 145 MCG CAPS capsule Take 1 capsule (145 mcg total) by mouth daily. Take 30 minutes prior to breakfast  30 capsule  11  . meclizine (ANTIVERT) 25 MG tablet Take 25 mg by mouth 3 (three) times daily as needed for dizziness.      Marland Kitchen NIFEdipine (PROCARDIA XL/ADALAT-CC) 90 MG 24 hr tablet Take 1 tablet (90 mg total) by mouth daily.  30 tablet  1  . omeprazole (PRILOSEC) 20 MG capsule Take 20 mg by mouth daily.      . ondansetron (ZOFRAN) 4 MG tablet Take 4 mg by mouth every 8 (eight) hours as needed for nausea or vomiting.      Marland Kitchen oxyCODONE-acetaminophen (PERCOCET/ROXICET) 5-325 MG per  tablet Take 1 tablet by mouth every 6 (six) hours as needed.  30 tablet  0  . sodium bicarbonate 650 MG tablet Take 1 tablet (650 mg total) by mouth 2 (two) times daily.  60 tablet  1   No current facility-administered medications on file prior to visit.    Allergies  Allergen Reactions  . Cephalexin     REVIEW OF SYSTEMS:  (Positives checked otherwise negative)  CARDIOVASCULAR:  []  chest pain, []  chest pressure, []  palpitations, []  shortness of breath when laying flat, []  shortness of breath with exertion,  []  pain in feet when walking, []  pain in feet when laying flat, []  history of blood clot in veins (DVT), []  history of phlebitis, []  swelling in legs, []  varicose veins  PULMONARY:  []  productive cough, []  asthma, []  wheezing  NEUROLOGIC:  []  weakness in arms or legs, []  numbness in arms or legs, []  difficulty speaking or slurred speech, []  temporary loss of vision in one eye, []  dizziness  HEMATOLOGIC:  []  bleeding problems, []  problems with blood clotting too easily  MUSCULOSKEL:  []  joint pain, []  joint swelling  GASTROINTEST:  []  vomiting blood, []  blood in stool     GENITOURINARY:  []  burning with urination, []  blood in urine, [x]  ESRD  PSYCHIATRIC:  []  history of major depression  INTEGUMENTARY:  []  rashes, []  ulcers  For VQI Use Only  PRE-ADM LIVING: Home  AMB STATUS: Ambulatory  Physical Examination Filed Vitals:   10/19/13 0928  BP: 173/70  Pulse: 83    Pulmonary: Sym exp, good air movt, CTAB, no rales, rhonchi, & wheezing   Cardiac: RRR, Nl S1, S2, no Murmurs, rubs or gallops  RUE: Incision is healed, skin feels warm, hand grip is 5/5, sensation in digits is intact, palpable thrill, bruit can be auscultated, On Sonosite: fistula >8 mm through except proximal segment where a large branch is evident  Medical Decision Making  Sonya [name] is a 63 y.o. year old female who presents s/p successful R 1st stage BRVT.  The patient will be scheduled for the  R 2nd stage BRVT (transposition portion) on 30 MAR 15.  Risk, benefits, and alternatives to access surgery were discussed.  The patient is aware the risks include but are not limited to: bleeding, infection, steal syndrome, nerve damage, ischemic monomelic neuropathy, failure to mature, need for additional procedures, death and stroke.    The patient agrees to proceed forward with the procedure.  Thank you for allowing [place] to participate in  this patient's care.  Adele Barthel, MD [facility] Office: [phone] Pager: [phone]  10/19/2013, 9:44 AM

## 2013-10-24 ENCOUNTER — Other Ambulatory Visit: Payer: Self-pay | Admitting: *Deleted

## 2013-10-26 ENCOUNTER — Encounter (HOSPITAL_COMMUNITY): Payer: Self-pay | Admitting: *Deleted

## 2013-10-26 ENCOUNTER — Encounter (HOSPITAL_COMMUNITY): Payer: Self-pay | Admitting: Pharmacy Technician

## 2013-10-28 MED ORDER — CHLORHEXIDINE GLUCONATE CLOTH 2 % EX PADS: 6.0000 | MEDICATED_PAD | Freq: Once | CUTANEOUS | Status: DC

## 2013-10-28 MED ORDER — VANCOMYCIN HCL IN DEXTROSE 1-5 GM/200ML-% IV SOLN
1000.0000 mg | INTRAVENOUS | Status: AC
  Administered 2013-10-29: 1000 mg via INTRAVENOUS
  Filled 2013-10-28: qty 200

## 2013-10-29 ENCOUNTER — Telehealth: Payer: Self-pay | Admitting: Vascular Surgery

## 2013-10-29 ENCOUNTER — Ambulatory Visit (HOSPITAL_COMMUNITY): Payer: BC Managed Care – PPO | Admitting: Critical Care Medicine

## 2013-10-29 ENCOUNTER — Encounter (HOSPITAL_COMMUNITY)
Admission: RE | Disposition: A | Discharge status: Home / Self Care | Payer: Self-pay | Source: Ambulatory Visit | Attending: Vascular Surgery

## 2013-10-29 ENCOUNTER — Encounter (HOSPITAL_COMMUNITY): Payer: BC Managed Care – PPO | Admitting: Critical Care Medicine

## 2013-10-29 ENCOUNTER — Ambulatory Visit (HOSPITAL_COMMUNITY)
Admission: RE | Admit: 2013-10-29 | Discharge: 2013-10-29 | Disposition: A | Discharge status: Home / Self Care | Payer: BC Managed Care – PPO | Source: Ambulatory Visit | Attending: Vascular Surgery | Admitting: Vascular Surgery

## 2013-10-29 ENCOUNTER — Encounter (HOSPITAL_COMMUNITY): Payer: Self-pay | Admitting: *Deleted

## 2013-10-29 DIAGNOSIS — Z8542 Personal history of malignant neoplasm of other parts of uterus: Secondary | ICD-10-CM | POA: Insufficient documentation

## 2013-10-29 DIAGNOSIS — N186 End stage renal disease: Secondary | ICD-10-CM

## 2013-10-29 DIAGNOSIS — I120 Hypertensive chronic kidney disease with stage 5 chronic kidney disease or end stage renal disease: Secondary | ICD-10-CM | POA: Insufficient documentation

## 2013-10-29 DIAGNOSIS — E039 Hypothyroidism, unspecified: Secondary | ICD-10-CM | POA: Insufficient documentation

## 2013-10-29 DIAGNOSIS — K219 Gastro-esophageal reflux disease without esophagitis: Secondary | ICD-10-CM | POA: Insufficient documentation

## 2013-10-29 DIAGNOSIS — N2581 Secondary hyperparathyroidism of renal origin: Secondary | ICD-10-CM | POA: Insufficient documentation

## 2013-10-29 DIAGNOSIS — N185 Chronic kidney disease, stage 5: Secondary | ICD-10-CM

## 2013-10-29 HISTORY — PX: BASCILIC VEIN TRANSPOSITION: SHX5742

## 2013-10-29 LAB — POCT I-STAT 4, (NA,K, GLUC, HGB,HCT)
Glucose, Bld: 96 mg/dL (ref 70–99)
HEMATOCRIT: 25 % — AB (ref 36.0–46.0)
Hemoglobin: 8.5 g/dL — ABNORMAL LOW (ref 12.0–15.0)
Potassium: 3.3 mEq/L — ABNORMAL LOW (ref 3.7–5.3)
Sodium: 141 mEq/L (ref 137–147)

## 2013-10-29 SURGERY — TRANSPOSITION, VEIN, BASILIC
Anesthesia: General | Site: Arm Upper | Laterality: Right

## 2013-10-29 MED ORDER — SODIUM CHLORIDE 0.9 % IV SOLN
INTRAVENOUS | Status: DC
  Administered 2013-10-29: 07:00:00 via INTRAVENOUS

## 2013-10-29 MED ORDER — OXYCODONE HCL 5 MG PO TABS: 5.0000 mg | ORAL_TABLET | ORAL | Status: DC | PRN

## 2013-10-29 MED ORDER — 0.9 % SODIUM CHLORIDE (POUR BTL) OPTIME
TOPICAL | Status: DC | PRN
  Administered 2013-10-29: 1000 mL

## 2013-10-29 MED ORDER — LIDOCAINE HCL (CARDIAC) 20 MG/ML IV SOLN
INTRAVENOUS | Status: DC | PRN
  Administered 2013-10-29: 20 mg via INTRAVENOUS

## 2013-10-29 MED ORDER — THROMBIN 20000 UNITS EX SOLR
CUTANEOUS | Status: DC | PRN
  Administered 2013-10-29 (×2): via TOPICAL

## 2013-10-29 MED ORDER — MIDAZOLAM HCL 5 MG/5ML IJ SOLN
INTRAMUSCULAR | Status: DC | PRN
Start: 2013-10-29 — End: 2013-10-29
  Administered 2013-10-29: 2 mg via INTRAVENOUS

## 2013-10-29 MED ORDER — HYDROMORPHONE HCL PF 1 MG/ML IJ SOLN
INTRAMUSCULAR | Status: AC
  Administered 2013-10-29: 0.25 mg via INTRAVENOUS
  Filled 2013-10-29: qty 1

## 2013-10-29 MED ORDER — HYDROMORPHONE HCL PF 1 MG/ML IJ SOLN
0.2500 mg | INTRAMUSCULAR | Status: DC | PRN
  Administered 2013-10-29 (×2): 0.25 mg via INTRAVENOUS
  Administered 2013-10-29 (×3): 0.5 mg via INTRAVENOUS

## 2013-10-29 MED ORDER — PROPOFOL 10 MG/ML IV BOLUS
INTRAVENOUS | Status: DC | PRN
  Administered 2013-10-29: 120 mg via INTRAVENOUS

## 2013-10-29 MED ORDER — LIDOCAINE-EPINEPHRINE 1 %-1:100000 IJ SOLN
INTRAMUSCULAR | Status: AC
  Filled 2013-10-29: qty 1

## 2013-10-29 MED ORDER — OXYCODONE HCL 5 MG PO TABS
5.0000 mg | ORAL_TABLET | Freq: Once | ORAL | Status: AC | PRN
  Administered 2013-10-29: 5 mg via ORAL

## 2013-10-29 MED ORDER — OXYCODONE HCL 5 MG PO TABS
ORAL_TABLET | ORAL | Status: AC
  Administered 2013-10-29: 5 mg via ORAL
  Filled 2013-10-29: qty 1

## 2013-10-29 MED ORDER — HYDROMORPHONE HCL PF 1 MG/ML IJ SOLN
INTRAMUSCULAR | Status: AC
  Administered 2013-10-29: 0.5 mg via INTRAVENOUS
  Filled 2013-10-29: qty 1

## 2013-10-29 MED ORDER — THROMBIN 20000 UNITS EX SOLR
CUTANEOUS | Status: AC
  Filled 2013-10-29: qty 20000

## 2013-10-29 MED ORDER — PROPOFOL 10 MG/ML IV BOLUS
INTRAVENOUS | Status: AC
  Filled 2013-10-29: qty 20

## 2013-10-29 MED ORDER — MIDAZOLAM HCL 2 MG/2ML IJ SOLN
INTRAMUSCULAR | Status: AC
  Filled 2013-10-29: qty 2

## 2013-10-29 MED ORDER — SODIUM CHLORIDE 0.9 % IR SOLN
Status: DC | PRN
  Administered 2013-10-29: 08:00:00

## 2013-10-29 MED ORDER — ONDANSETRON HCL 4 MG/2ML IJ SOLN
INTRAMUSCULAR | Status: DC | PRN
  Administered 2013-10-29: 4 mg via INTRAVENOUS

## 2013-10-29 MED ORDER — FENTANYL CITRATE 0.05 MG/ML IJ SOLN
INTRAMUSCULAR | Status: AC
  Filled 2013-10-29: qty 5

## 2013-10-29 MED ORDER — ARTIFICIAL TEARS OP OINT
TOPICAL_OINTMENT | OPHTHALMIC | Status: AC
  Filled 2013-10-29: qty 3.5

## 2013-10-29 MED ORDER — FENTANYL CITRATE 0.05 MG/ML IJ SOLN
INTRAMUSCULAR | Status: DC | PRN
  Administered 2013-10-29 (×8): 25 ug via INTRAVENOUS

## 2013-10-29 MED ORDER — OXYCODONE HCL 5 MG/5ML PO SOLN: 5.0000 mg | Freq: Once | ORAL | Status: AC | PRN

## 2013-10-29 SURGICAL SUPPLY — 42 items
ADH SKN CLS APL DERMABOND .7 (GAUZE/BANDAGES/DRESSINGS) ×1
CANISTER SUCTION 2500CC (MISCELLANEOUS) ×3 IMPLANT
CLIP TI MEDIUM 24 (CLIP) ×3 IMPLANT
CLIP TI WIDE RED SMALL 24 (CLIP) ×3 IMPLANT
CORDS BIPOLAR (ELECTRODE) IMPLANT
COVER PROBE W GEL 5X96 (DRAPES) ×3 IMPLANT
COVER SURGICAL LIGHT HANDLE (MISCELLANEOUS) ×3 IMPLANT
DECANTER SPIKE VIAL GLASS SM (MISCELLANEOUS) IMPLANT
DERMABOND ADVANCED (GAUZE/BANDAGES/DRESSINGS) ×2
DERMABOND ADVANCED .7 DNX12 (GAUZE/BANDAGES/DRESSINGS) ×1 IMPLANT
ELECT REM PT RETURN 9FT ADLT (ELECTROSURGICAL) ×3
ELECTRODE REM PT RTRN 9FT ADLT (ELECTROSURGICAL) ×1 IMPLANT
GLOVE BIO SURGEON STRL SZ 6.5 (GLOVE) ×2 IMPLANT
GLOVE BIO SURGEON STRL SZ7 (GLOVE) ×3 IMPLANT
GLOVE BIO SURGEONS STRL SZ 6.5 (GLOVE) ×1
GLOVE BIOGEL PI IND STRL 6.5 (GLOVE) ×2 IMPLANT
GLOVE BIOGEL PI IND STRL 7.0 (GLOVE) IMPLANT
GLOVE BIOGEL PI IND STRL 7.5 (GLOVE) ×1 IMPLANT
GLOVE BIOGEL PI IND STRL 8 (GLOVE) ×1 IMPLANT
GLOVE BIOGEL PI INDICATOR 6.5 (GLOVE) ×4
GLOVE BIOGEL PI INDICATOR 7.0 (GLOVE) ×2
GLOVE BIOGEL PI INDICATOR 7.5 (GLOVE) ×2
GLOVE BIOGEL PI INDICATOR 8 (GLOVE) ×2
GLOVE ECLIPSE 6.5 STRL STRAW (GLOVE) ×2 IMPLANT
GOWN STRL REUS W/ TWL LRG LVL3 (GOWN DISPOSABLE) ×3 IMPLANT
GOWN STRL REUS W/TWL LRG LVL3 (GOWN DISPOSABLE) ×9
KIT BASIN OR (CUSTOM PROCEDURE TRAY) ×3 IMPLANT
KIT ROOM TURNOVER OR (KITS) ×3 IMPLANT
NS IRRIG 1000ML POUR BTL (IV SOLUTION) ×3 IMPLANT
PACK CV ACCESS (CUSTOM PROCEDURE TRAY) ×3 IMPLANT
PAD ARMBOARD 7.5X6 YLW CONV (MISCELLANEOUS) ×6 IMPLANT
SPONGE SURGIFOAM ABS GEL 100 (HEMOSTASIS) ×6 IMPLANT
SUT MNCRL AB 4-0 PS2 18 (SUTURE) ×5 IMPLANT
SUT PROLENE 6 0 BV (SUTURE) ×3 IMPLANT
SUT PROLENE 7 0 BV 1 (SUTURE) IMPLANT
SUT SILK 2 0 SH (SUTURE) IMPLANT
SUT VIC AB 3-0 SH 27 (SUTURE) ×12
SUT VIC AB 3-0 SH 27X BRD (SUTURE) ×4 IMPLANT
TOWEL OR 17X24 6PK STRL BLUE (TOWEL DISPOSABLE) ×3 IMPLANT
TOWEL OR 17X26 10 PK STRL BLUE (TOWEL DISPOSABLE) ×3 IMPLANT
UNDERPAD 30X30 INCONTINENT (UNDERPADS AND DIAPERS) ×3 IMPLANT
WATER STERILE IRR 1000ML POUR (IV SOLUTION) IMPLANT

## 2013-10-29 NOTE — Anesthesia Procedure Notes (Signed)
Procedure Name: LMA Insertion Date/Time: 10/29/2013 7:28 AM Performed by: Carola Frost Pre-anesthesia Checklist: Patient identified, Emergency Drugs available, Suction available, Patient being monitored and Timeout performed Patient Re-evaluated:Patient Re-evaluated prior to inductionOxygen Delivery Method: Circle system utilized Preoxygenation: Pre-oxygenation with 100% oxygen Intubation Type: IV induction Ventilation: Mask ventilation without difficulty LMA: LMA inserted LMA Size: 4.0 Number of attempts: 1 Placement Confirmation: positive ETCO2 and breath sounds checked- equal and bilateral Tube secured with: Tape Dental Injury: Teeth and Oropharynx as per pre-operative assessment

## 2013-10-29 NOTE — H&P (View-Only) (Signed)
Postoperative Access Visit   History of Present Illness  Sonya [name] is a 63 y.o. year old female who presents for postoperative follow-up for: R 1st BRVT (Date: 09/17/13).  The patient's wounds are healed.  The patient notes no steal symptoms.  The patient is able to complete their activities of daily living.  The patient's current symptoms are: none.  Pt cont. HD via [facility] TDC.  Past Medical History  Diagnosis Date  . HTN (hypertension)   . Hypothyroidism   . Uterine cancer 2012  . Anemia   . CKD (chronic kidney disease)   . Dysphagia   . Hyperparathyroidism due to renal insufficiency   . GERD (gastroesophageal reflux disease)   . Diverticulitis     Past Surgical History  Procedure Laterality Date  . Cholecystectomy    . Cesarean section    . Laparoscopic total hysterectomy    . Colonoscopy  2000    TICS, [facility]  . Colonoscopy  2003 NUR BRBPR D50 V6    DC/[facility] TICS, IH  . Abdominal hysterectomy    . Nasal septum surgery    . Esophagogastroduodenoscopy (egd) with esophageal dilation N/A 06/08/2013    Dr. Fields:Stricture at the gastroesophageal junction/MILD NON-erosive gastritis (inflammation) was found in the gastric antrum; multiple biopsies/NO SOURCE FOR ANEMIA IDETIFIED-MOST LIKELY DUE TO ANEMIA OF CHRONIC DISEASE  . Colonoscopy N/A 09/04/2013    Procedure: COLONOSCOPY;  Surgeon: Danie Binder, MD;  [facility];  Service: Endoscopy;  Laterality: N/A;  10:30AM-moved to 9:25 [name]  . Appendectomy    . Bascilic vein transposition Right 09/17/2013    Procedure: BRACHIAL VEIN TRANSPOSITION;  Surgeon: Conrad [place] Sharon, MD;  Location: [facility];  Service: Vascular;  Laterality: Right;  . Insertion of dialysis catheter Left 09/17/2013    Procedure: INSERTION OF DIALYSIS CATHETER;  Surgeon: Conrad Halfway House, MD;  Location: [facility];  Service: Vascular;  Laterality: Left;    History   Social History  . Marital Status: Married    Spouse Name: N/A    Number of  Children: N/A  . Years of Education: N/A   Occupational History  . Not on file.   Social History Main Topics  . Smoking status: Never Smoker   . Smokeless tobacco: Never Used  . Alcohol Use: No  . Drug Use: No  . Sexual Activity: Not on file   Other Topics Concern  . 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    Family History  Problem Relation Age of Onset  . Colon cancer Neg Hx   . Cancer Mother   . Diabetes Mother   . Hypertension Mother   . Diabetes Father   . Hypertension Father   . Hypertension Sister      Current Outpatient Prescriptions on File Prior to Visit  Medication Sig Dispense Refill  . allopurinol (ZYLOPRIM) 100 MG tablet Take 300 mg by mouth daily.       . darbepoetin (ARANESP) 40 MCG/0.4ML SOLN injection Inject 0.4 mLs (40 mcg total) into the skin every 7 (seven) days.  8.4 mL    . docusate sodium (COLACE) 100 MG capsule Take 100 mg by mouth 2 (two) times daily.      . hydrALAZINE (APRESOLINE) 50 MG tablet Take 1.5 tablets (75 mg total) by mouth every 8 (eight) hours.  90 tablet  1  . labetalol (NORMODYNE) 300 MG  tablet Take 1 tablet (300 mg total) by mouth 3 (three) times daily.  90 tablet  1  . levothyroxine (SYNTHROID, LEVOTHROID) 50 MCG tablet Take 1 tablet (50 mcg total) by mouth daily.  30 tablet  2  . Linaclotide (LINZESS) 145 MCG CAPS capsule Take 1 capsule (145 mcg total) by mouth daily. Take 30 minutes prior to breakfast  30 capsule  11  . meclizine (ANTIVERT) 25 MG tablet Take 25 mg by mouth 3 (three) times daily as needed for dizziness.      Marland Kitchen NIFEdipine (PROCARDIA XL/ADALAT-CC) 90 MG 24 hr tablet Take 1 tablet (90 mg total) by mouth daily.  30 tablet  1  . omeprazole (PRILOSEC) 20 MG capsule Take 20 mg by mouth daily.      . ondansetron (ZOFRAN) 4 MG tablet Take 4 mg by mouth every 8 (eight) hours as needed for nausea or vomiting.      Marland Kitchen oxyCODONE-acetaminophen (PERCOCET/ROXICET) 5-325 MG per  tablet Take 1 tablet by mouth every 6 (six) hours as needed.  30 tablet  0  . sodium bicarbonate 650 MG tablet Take 1 tablet (650 mg total) by mouth 2 (two) times daily.  60 tablet  1   No current facility-administered medications on file prior to visit.    Allergies  Allergen Reactions  . Cephalexin     REVIEW OF SYSTEMS:  (Positives checked otherwise negative)  CARDIOVASCULAR:  []  chest pain, []  chest pressure, []  palpitations, []  shortness of breath when laying flat, []  shortness of breath with exertion,  []  pain in feet when walking, []  pain in feet when laying flat, []  history of blood clot in veins (DVT), []  history of phlebitis, []  swelling in legs, []  varicose veins  PULMONARY:  []  productive cough, []  asthma, []  wheezing  NEUROLOGIC:  []  weakness in arms or legs, []  numbness in arms or legs, []  difficulty speaking or slurred speech, []  temporary loss of vision in one eye, []  dizziness  HEMATOLOGIC:  []  bleeding problems, []  problems with blood clotting too easily  MUSCULOSKEL:  []  joint pain, []  joint swelling  GASTROINTEST:  []  vomiting blood, []  blood in stool     GENITOURINARY:  []  burning with urination, []  blood in urine, [x]  ESRD  PSYCHIATRIC:  []  history of major depression  INTEGUMENTARY:  []  rashes, []  ulcers  For VQI Use Only  PRE-ADM LIVING: Home  AMB STATUS: Ambulatory  Physical Examination Filed Vitals:   10/19/13 0928  BP: 173/70  Pulse: 83    Pulmonary: Sym exp, good air movt, CTAB, no rales, rhonchi, & wheezing   Cardiac: RRR, Nl S1, S2, no Murmurs, rubs or gallops  RUE: Incision is healed, skin feels warm, hand grip is 5/5, sensation in digits is intact, palpable thrill, bruit can be auscultated, On Sonosite: fistula >8 mm through except proximal segment where a large branch is evident  Medical Decision Making  Sonya [name] is a 63 y.o. year old female who presents s/p successful R 1st stage BRVT.  The patient will be scheduled for the  R 2nd stage BRVT (transposition portion) on 30 MAR 15.  Risk, benefits, and alternatives to access surgery were discussed.  The patient is aware the risks include but are not limited to: bleeding, infection, steal syndrome, nerve damage, ischemic monomelic neuropathy, failure to mature, need for additional procedures, death and stroke.    The patient agrees to proceed forward with the procedure.  Thank you for allowing [place] to participate in  this patient's care.  Adele Barthel, MD [facility] Office: [phone] Pager: [phone]  10/19/2013, 9:44 AM

## 2013-10-29 NOTE — Op Note (Signed)
    OPERATIVE NOTE   PROCEDURE: right second stage brachial vein transposition (brachiobrachial arteriovenous fistula) placement  PRE-OPERATIVE DIAGNOSIS: end stage renal disease   POST-OPERATIVE DIAGNOSIS: same as above   SURGEON: Adele Barthel, MD  ANESTHESIA: general  ESTIMATED BLOOD LOSS: 50 cc  FINDING(S): 1.  Fistula >8 mm except segment with trifurcation (improved to 6 mm with lysis of surrounding connective tissue) 2.  Palpable thrill throughout  SPECIMEN(S):  none  INDICATIONS:   Sonya [name] is a 63 y.o. female who presents with successful right brachial vein transposition in setting of end stage renal disease.  The patient is scheduled for right second stage brachial vein transposition.  The patient is aware the risks include but are not limited to: bleeding, infection, steal syndrome, nerve damage, ischemic monomelic neuropathy, failure to mature, and need for additional procedures.  The patient is aware of the risks of the procedure and elects to proceed forward.  DESCRIPTION: After full informed written consent was obtained from the patient, the patient was brought back to the operating room and placed supine upon the operating table.  Prior to induction, the patient received IV antibiotics.   After obtaining adequate anesthesia, the patient was then prepped and draped in the standard fashion for a right arm access procedure.  I turned my attention first to identifying the patient's brachiobrachial arteriovenous fistula.  Using SonoSite guidance, the location of this fistula was marked out on the skin.  I made an longitudinal incision over the fistula from its arterial anastomosis up to its axillary extent.  I carefully dissected the fistula away from its adjacent nerves.  Eventually the entirety of this fistula was mobilized and I dissected a plane on top of the bicipital fascia with electrocautery.  Also with electrocautery, I developed a curvilinear subcutaneous trough in  which to transpose the fistula into.  The fistula was secured in its new location with loosely placed interrupt 3-0 Vicryl stitches, taking care to avoid compressing the fistula.  The deep subcutaneous tissue was inspected for bleeding.  Bleeding was controlled with electrocautery and placement of large pieces of thrombin and gelfoam.  I washed out the surgical site after waiting a few minutes, and there was no further bleeding.  The deep subcutaneous tissue was reapproximated with mattress sutures of 3-0 Vicryl to eliminate some of the dead space.  The superficial subcutaneous tissue was then reapproximated along the incision line with a running stitch of 3-0 Vicryl.  The skin was then reapproximated with a running subcuticular of 4-0 Monocryl.  The skin was then cleaned, dried, and reinforced with Dermabond.  The patient tolerated this procedure well.   COMPLICATIONS: none  CONDITION: stable  Adele Barthel, MD [facility] Office: [phone] Pager: [phone]  10/29/2013, 9:17 AM

## 2013-10-29 NOTE — Transfer of Care (Signed)
Immediate Anesthesia Transfer of Care Note  Patient: Sonya [name]  Procedure(s) Performed: Procedure(s): RIGHT SECOND STAGE BRACHIAL VEIN TRANSPOSITION (Right)  Patient Location: PACU  Anesthesia Type:General  Level of Consciousness: awake, alert  and oriented  Airway & Oxygen Therapy: Patient Spontanous Breathing and Patient connected to nasal cannula oxygen  Post-op Assessment: Report given to PACU RN, Post -op Vital signs reviewed and stable and Patient moving all extremities X 4  Post vital signs: Reviewed and stable  Complications: No apparent anesthesia complications

## 2013-10-29 NOTE — Interval H&P Note (Signed)
History and Physical Interval Note:  10/29/2013 7:10 AM  Sonya [name]  has presented today for surgery, with the diagnosis of ESRD  The various methods of treatment have been discussed with the patient and family. After consideration of risks, benefits and other options for treatment, the patient has consented to  Procedure(s): [place] (Right) as a surgical intervention .  The patient's history has been reviewed, patient examined, no change in status, stable for surgery.  I have reviewed the patient's chart and labs.  Questions were answered to the patient's satisfaction.     CHEN,BRIAN LIANG-YU

## 2013-10-29 NOTE — Anesthesia Preprocedure Evaluation (Addendum)
Anesthesia Evaluation  Patient identified by MRN, date of birth, ID band Patient awake    Reviewed: Allergy & Precautions, H&P , NPO status , Patient's Chart, lab work & pertinent test results, reviewed documented beta blocker date and time   Airway Mallampati: II TM Distance: >3 FB Neck ROM: Full    Dental  (+) Dental Advisory Given, Edentulous Upper, Edentulous Lower   Pulmonary  breath sounds clear to auscultation        Cardiovascular hypertension, Pt. on medications and Pt. on home beta blockers Rhythm:Regular Rate:Normal     Neuro/Psych    GI/Hepatic GERD-  Medicated and Controlled,  Endo/Other  Hypothyroidism   Renal/GU ESRF and DialysisRenal disease     Musculoskeletal   Abdominal   Peds  Hematology   Anesthesia Other Findings   Reproductive/Obstetrics                          Anesthesia Physical Anesthesia Plan  ASA: III  Anesthesia Plan: General   Post-op Pain Management:    Induction: Intravenous  Airway Management Planned: LMA  Additional Equipment:   Intra-op Plan:   Post-operative Plan: Extubation in OR  Informed Consent: I have reviewed the patients History and Physical, chart, labs and discussed the procedure including the risks, benefits and alternatives for the proposed anesthesia with the patient or authorized representative who has indicated his/her understanding and acceptance.   Dental advisory given  Plan Discussed with: Anesthesiologist and Surgeon  Anesthesia Plan Comments:         Anesthesia Quick Evaluation

## 2013-10-29 NOTE — Telephone Encounter (Addendum)
Message copied by Gena Fray on Mon Oct 29, 2013  4:47 PM ------      Message from: Denman George      Created: Mon Oct 29, 2013 11:53 AM      Regarding: Zigmund Daniel log/ f/u appt 4 wks                   ----- Message -----         From: Conrad [place], MD         Sent: 10/29/2013   9:19 AM           To: [address]            LYNN MALPASS      [id number]      24-Jun-1951            PROCEDURE:      right second stage brachial vein transposition (brachiobrachial arteriovenous fistula) placement            Follow-up: 4 weeks ------  10/29/13: lm for pt and mailed letter, dpm

## 2013-10-29 NOTE — Preoperative (Signed)
Beta Blockers   Reason not to administer Beta Blockers:Not Applicable, pt took labetalol 10/29/13

## 2013-10-29 NOTE — Anesthesia Postprocedure Evaluation (Signed)
  Anesthesia Post-op Note  Patient: Sonya [name]  Procedure(s) Performed: Procedure(s): RIGHT SECOND STAGE BRACHIAL VEIN TRANSPOSITION (Right)  Patient Location: PACU  Anesthesia Type:General  Level of Consciousness: awake and alert   Airway and Oxygen Therapy: Patient Spontanous Breathing  Post-op Pain: moderate  Post-op Assessment: Post-op Vital signs reviewed, Patient's Cardiovascular Status Stable and Respiratory Function Stable  Post-op Vital Signs: Reviewed  Filed Vitals:   10/29/13 1200  BP: 168/73  Pulse: 75  Temp:   Resp: 18    Complications: No apparent anesthesia complications

## 2013-10-30 ENCOUNTER — Encounter (HOSPITAL_COMMUNITY): Payer: Self-pay | Admitting: Vascular Surgery

## 2013-11-19 ENCOUNTER — Telehealth: Payer: Self-pay | Admitting: *Deleted

## 2013-11-19 NOTE — Telephone Encounter (Signed)
Pt is aware and said to let [name] know that she will be at dialysis in the morning , wait til later in the day to give her a call.

## 2013-11-19 NOTE — Telephone Encounter (Signed)
Pt called to reschedule her swallowing study. Please advise [phone]

## 2013-11-19 NOTE — Telephone Encounter (Signed)
I called pt and she said that Dr. Lowanda Foster wanted her to see Dr. Nona Dell ASAP about scheduling the capsule study. She said she is receiving iron when she goes for dialysis and her hemoglobin is going down instead of going up.  I told her Dr. Oneida Alar will be back from vacation tomorrow and I will let her know right away.  Also, sending this to Laban Emperor, NP for any immediate advise.

## 2013-11-19 NOTE — Telephone Encounter (Signed)
Sonya [name] wants her to come back in for a follow up exam before r/s her GIVENS

## 2013-11-19 NOTE — Telephone Encounter (Signed)
HOLD IRON X 7 days prior.

## 2013-11-19 NOTE — Telephone Encounter (Signed)
Also, routing to Dr. Oneida Alar for tomorrow.

## 2013-11-19 NOTE — Telephone Encounter (Signed)
I called Dr. Rhona Leavens office at [phone] and [facility] for them to fax over recent labs and especially need the last several hemoglobins.

## 2013-11-19 NOTE — Telephone Encounter (Signed)
I called to make pt a follow up appointment I made pt aware that the next available is 12/19/13 with Laban Emperor at 9:30. Pt said that is to far out for her, pt stated Dr. Docia Furl wanted pt to schedule this swallowing study as soon as possible, pt said Dr. Oneida Alar told her to get her staff to contact her if anything changed, I made pt aware Dr. Oneida Alar is on vacation and pt said Dr. Oneida Alar told her that her staff can still get in touch with her when she is off. Pt has Dialysis and pt said this is what happened to her last time she tried to schedule no one talked to the nurse. Pt said she needs to be seen sooner. Please advise

## 2013-11-19 NOTE — Telephone Encounter (Signed)
I called pt and LMOM that Vicente Males was ordering the capsule study and she will need to be off of iron x 7 days prior to the study.

## 2013-11-19 NOTE — Telephone Encounter (Signed)
Let's get recent labs. Needs capsule study, which was previously recommended. No OV needed.

## 2013-11-20 ENCOUNTER — Other Ambulatory Visit: Payer: Self-pay | Admitting: Gastroenterology

## 2013-11-20 NOTE — Telephone Encounter (Signed)
GIVENS Is scheduled for Monday May 4th and I have mailed her instructions to her

## 2013-11-21 ENCOUNTER — Ambulatory Visit: Payer: BC Managed Care – PPO | Admitting: Gastroenterology

## 2013-11-21 NOTE — Telephone Encounter (Signed)
Patient was scheduled to see Dr Oneida Alar today in the office, however she stated(patient) that she was asked to complete this Givens Capsule Study prior to an office visit.    Appt in the office was cancelled.

## 2013-11-22 ENCOUNTER — Encounter (HOSPITAL_COMMUNITY): Payer: Self-pay | Admitting: Pharmacy Technician

## 2013-11-22 NOTE — Telephone Encounter (Signed)
REVIEWED.  

## 2013-11-22 NOTE — Telephone Encounter (Signed)
GIVENS APPROVED PAC# [id number]

## 2013-11-28 ENCOUNTER — Encounter: Payer: Self-pay | Admitting: General Practice

## 2013-12-03 ENCOUNTER — Encounter (HOSPITAL_COMMUNITY): Payer: Self-pay | Admitting: *Deleted

## 2013-12-03 ENCOUNTER — Ambulatory Visit (HOSPITAL_COMMUNITY)
Admission: RE | Admit: 2013-12-03 | Discharge: 2013-12-03 | Disposition: A | Discharge status: Home / Self Care | Payer: BC Managed Care – PPO | Source: Ambulatory Visit | Attending: Gastroenterology | Admitting: Gastroenterology

## 2013-12-03 ENCOUNTER — Encounter (HOSPITAL_COMMUNITY)
Admission: RE | Disposition: A | Discharge status: Home / Self Care | Payer: Self-pay | Source: Ambulatory Visit | Attending: Gastroenterology

## 2013-12-03 DIAGNOSIS — K2970 Gastritis, unspecified, without bleeding: Secondary | ICD-10-CM | POA: Insufficient documentation

## 2013-12-03 DIAGNOSIS — D649 Anemia, unspecified: Secondary | ICD-10-CM | POA: Insufficient documentation

## 2013-12-03 DIAGNOSIS — K2990 Gastroduodenitis, unspecified, without bleeding: Secondary | ICD-10-CM

## 2013-12-03 HISTORY — PX: GIVENS CAPSULE STUDY: SHX5432

## 2013-12-03 SURGERY — IMAGING PROCEDURE, GI TRACT, INTRALUMINAL, VIA CAPSULE

## 2013-12-04 DIAGNOSIS — D649 Anemia, unspecified: Secondary | ICD-10-CM

## 2013-12-06 ENCOUNTER — Encounter: Payer: Self-pay | Admitting: Vascular Surgery

## 2013-12-06 ENCOUNTER — Encounter (HOSPITAL_COMMUNITY): Payer: Self-pay | Admitting: Gastroenterology

## 2013-12-07 ENCOUNTER — Ambulatory Visit (INDEPENDENT_AMBULATORY_CARE_PROVIDER_SITE_OTHER): Payer: BC Managed Care – PPO | Admitting: Vascular Surgery

## 2013-12-07 ENCOUNTER — Encounter: Payer: Self-pay | Admitting: Vascular Surgery

## 2013-12-07 VITALS — BP 185/76 | HR 76 | Ht 69.0 in | Wt 171.0 lb

## 2013-12-07 DIAGNOSIS — N186 End stage renal disease: Secondary | ICD-10-CM

## 2013-12-07 NOTE — Progress Notes (Signed)
    Postoperative Access Visit   History of Present Illness  Sonya [name] is a 63 y.o. year old female who presents for postoperative follow-up for: R 2nd BRVT (Date: 10/29/13).  The patient's wounds are healed.  The patient notes no steal symptoms.  The patient is able to complete their activities of daily living.  The patient's current symptoms are: intermittent aching in incision site.  For VQI Use Only  PRE-ADM LIVING: Home  AMB STATUS: Ambulatory  Physical Examination Filed Vitals:   12/07/13 0851  BP: 185/76  Pulse: 76    RUE: Incision is healed, skin feels warm, hand grip is 5/5, sensation in digits is intact, palpable thrill, bruit can be auscultated , fistula is visible  Medical Decision Making  BRITTANI GLIDEWELL is a 63 y.o. year old female who presents s/p R 2nd BRVT.  The patient's access is ready for use.  The patient's tunneled dialysis catheter can be removed after two successful cannulations and completed dialysis treatments.  Thank you for allowing [place] to participate in this patient's care.  Adele Barthel, MD [facility] Office: [phone] Pager: [phone]  12/07/2013, 9:03 AM

## 2013-12-09 NOTE — Telephone Encounter (Signed)
PLEASE CALL PT. THE CAMERA PILL SHOWS GASTRITIS. HER SMALL BOWEL IS NORMAL. HER LOW BLOOD COUNT IS MOST LIKELY DUE TO KIDNEY DISEASE SHE SHOULD: 1. FOLLOW UP WITH NEPHROLOGY/HEMATOLOGY. 2. SHE DOES NOT NEED ADDITIONAL GI WORKUP AT THIS TIME 3. OPV IN 4 MOS E30 ANEMIA, CONSTIPATION

## 2013-12-09 NOTE — Procedures (Addendum)
  PATIENT DATA: WEIGHT:  180 LBS    WAIST:  40 IN     HEIGHT: 69 IN    GASTRIC PASSAGE TIME: 1H 3 m, SB PASSAGE TIME: 3H 41m  RESULTS: LIMITED views of gastric mucosa due to retained contents.  OCCASIONAL EROSION. No ULCERS, masses or AVMs seen IN THE SMALL BOWEL.  LIMITED VIEWS OF THE COLON DUE TO RETAINED CONTENTS. No old blood or fresh blood in the stomach, small bowel, or colon.  DIAGNOSIS:  1. GASTRITIS 2. ANEMIA MOST LIKELY DUE TO CHRONIC DISEASE  Plan: 1. FOLLOW UP WITH NEPHROLOGY/HEMATOLOGY. 2. NO ADDITIONAL GI WORKUP NEEDED AT THIS TIME 3. OPV IN 4 MOS

## 2013-12-10 NOTE — Telephone Encounter (Signed)
Reminder in EPIC 

## 2013-12-10 NOTE — Telephone Encounter (Signed)
Called and informed pt of results

## 2013-12-19 ENCOUNTER — Ambulatory Visit: Payer: BC Managed Care – PPO | Admitting: Gastroenterology

## 2014-03-28 ENCOUNTER — Encounter: Payer: Self-pay | Admitting: Gastroenterology

## 2014-04-22 ENCOUNTER — Encounter: Payer: Self-pay | Admitting: Gastroenterology

## 2014-04-23 ENCOUNTER — Telehealth: Payer: Self-pay | Admitting: Gastroenterology

## 2014-04-23 NOTE — Telephone Encounter (Signed)
Pt called to make OV with SF yesterday and is aware of OV on 10/13 at 9. Today Luiz Blare, RN at [facility] called to say that this was her God Mother and she was wanting patient to be seen sooner that this OV was too long for her to wait. I told her that [facility] next available was in November and nothing else was available at the moment. She said that patient is a dialysis patient and she has been passing bright red blood and she needed to be seen before then. Please advise if SF can see this lady sooner or if I can use an Urgent spot with the extender if patient agrees to. [phone]

## 2014-04-24 NOTE — Telephone Encounter (Signed)
I called pt. She said she wants to be seen for her stomach growling, it has been doing this for a long time she said. She also saw some bright red blood x 2 last week. Said she is not constipated but she only has a BM once every 3 days and the stool is not hard. She and a God Daughter is requesting her to be seen urgently. Please advise!

## 2014-04-29 NOTE — Telephone Encounter (Signed)
MADE APPOINTMENT FOR Wednesday 09/30 AND PATIENT IS AWARE

## 2014-04-29 NOTE — Telephone Encounter (Signed)
PLEASE CALL PT. SHE CAN COME TO SEE DR. Oneda Duffett WED SEP 30 AT 1130.

## 2014-04-29 NOTE — Telephone Encounter (Signed)
I called to check on pt. She is most concerned about her stomach growling so much and it will do it for 5-6 min at a time when it gets started. She did see bright red blood in her stool x 2 last week. Says she is not constipated. She is aware that Dr. Oneida Alar is off and will not be back until tomorrow at the hospital. She would rather see her and will wait for recommendations from her. She is aware if she starts passing much blood to call us or go to the ED.

## 2014-05-01 ENCOUNTER — Ambulatory Visit (INDEPENDENT_AMBULATORY_CARE_PROVIDER_SITE_OTHER): Payer: BC Managed Care – PPO | Admitting: Gastroenterology

## 2014-05-01 ENCOUNTER — Other Ambulatory Visit: Payer: Self-pay

## 2014-05-01 ENCOUNTER — Encounter: Payer: Self-pay | Admitting: Gastroenterology

## 2014-05-01 VITALS — BP 150/80 | HR 80 | Temp 97.0°F | Ht 69.0 in | Wt 166.0 lb

## 2014-05-01 DIAGNOSIS — R198 Other specified symptoms and signs involving the digestive system and abdomen: Secondary | ICD-10-CM

## 2014-05-01 DIAGNOSIS — K5900 Constipation, unspecified: Secondary | ICD-10-CM

## 2014-05-01 DIAGNOSIS — N186 End stage renal disease: Secondary | ICD-10-CM

## 2014-05-01 DIAGNOSIS — R1915 Other abnormal bowel sounds: Secondary | ICD-10-CM

## 2014-05-01 MED ORDER — LINACLOTIDE 290 MCG PO CAPS: ORAL_CAPSULE | ORAL | Status: DC

## 2014-05-01 NOTE — Progress Notes (Signed)
appt nic'd

## 2014-05-01 NOTE — Assessment & Plan Note (Signed)
MOST LIKELY DUE TO SORBITOL AND EXACERBATED BY DELAYED COLONIC TRANSIT DUE TO IRON AND DECREASED WATER INTAKE.  STOP SORBITOL.

## 2014-05-01 NOTE — Progress Notes (Signed)
cc'ed to pcp and Dr Lowanda Foster

## 2014-05-01 NOTE — Progress Notes (Signed)
Subjective:    Patient ID: Sonya [name], female    DOB: [birth year], 63 y.o.   MRN: [id number] Sonya Smiles., MD KIDNEY DOCTOR: [id number] ([place])  HPI WANTS ME TO FIX HER LOUD BOWELs AND RECTAL BLEEDING.Stomach growling and can be very loud. SEP 15 SAW BLOOD IN STOOLS. ONE DAY THIS WEEK SAW SOME BUT NOT AS MUCH. HUSBAND HAD SURGERY. ON DIALYSIS: TUTHSAT SINCE FEB 2015 AND HAVING TROUBLE WITH MOVING BOWELS. TAKING MEDS FOR 3 MOS TO HELP BOWELS. STOOLS SOFT. BMs: EVERY OTHER NIGHT. TAKES MEDS QONIGHT. WHEN SHE GOES ALIRIGHT AND WHEN SHE LEAVES IT MAKES HER ACHE/CRAMP. APPETITE-GETS SICK(QUEASY) AND STOPS AND CHANGES TO SOMETHING ELSE. WEIGHT DOWN FROM 186( JAN 2015) TO 166 LBS. BMs: 1X Q3 DAYS. NO ABDOMINAL PAIN JUST GROWLS.  PT DENIES FEVER, CHILLS, HEMATEMESIS,RECTAL PAIN/PRESUURE, vomiting, melena, diarrhea, CHEST PAIN, SHORTNESS OF BREATH,  CHANGE IN BOWEL IN HABITS, abdominal pain, problems swallowing, OR heartburn or indigestion.   Past Medical History  Diagnosis Date  . HTN (hypertension)   . Hypothyroidism   . Uterine cancer 2012  . Anemia   . CKD (chronic kidney disease)   . Dysphagia   . Hyperparathyroidism due to renal insufficiency   . GERD (gastroesophageal reflux disease)   . Diverticulitis    Past Surgical History  Procedure Laterality Date  . Cholecystectomy    . Cesarean section    . Laparoscopic total hysterectomy    . Colonoscopy  2000    TICS, I[facility]  . Colonoscopy  2003 NUR BRBPR D50 V6    [facility] TICS, [facility]  . Abdominal hysterectomy    . Nasal septum surgery    . Esophagogastroduodenoscopy (egd) with esophageal dilation N/A 06/08/2013    Dr. Camauri Fleece:Stricture at the gastroesophageal junction/MILD NON-erosive gastritis (inflammation) was found in the gastric antrum; multiple biopsies/NO SOURCE FOR ANEMIA IDETIFIED-MOST LIKELY DUE TO ANEMIA OF CHRONIC DISEASE  . Colonoscopy N/A 09/04/2013    Procedure: COLONOSCOPY;  Surgeon: West Bali, MD;   Location: [facility];  Service: Endoscopy;  Laterality: N/A;  10:30AM-moved to 9:25 Soledad Gerlach to notify pt  . Appendectomy    . Bascilic vein transposition Right 09/17/2013    Procedure: BRACHIAL VEIN TRANSPOSITION;  Surgeon: Fransisco Hertz, MD;  Location: [facility] OR;  Service: Vascular;  Laterality: Right;  . Insertion of dialysis catheter Left 09/17/2013    Procedure: INSERTION OF DIALYSIS CATHETER;  Surgeon: Fransisco Hertz, MD;  Location: [facility] OR;  Service: Vascular;  Laterality: Left;  . Bascilic vein transposition Right 10/29/2013    Procedure: RIGHT SECOND STAGE BRACHIAL VEIN TRANSPOSITION;  Surgeon: Fransisco Hertz, MD;  Location: [facility] OR;  Service: Vascular;  Laterality: Right;  . Givens capsule study N/A 12/03/2013    Procedure: GIVENS CAPSULE STUDY;  Surgeon: West Bali, MD;  Location: [facility];  Service: Endoscopy;  Laterality: N/A;  7:30    Allergies  Allergen Reactions  . Cephalexin Itching    Current Outpatient Prescriptions  Medication Sig Dispense Refill  . allopurinol (ZYLOPRIM) 300 MG tablet Take 300 mg by mouth daily.      Marland Kitchen aspirin EC 81 MG tablet Take 81 mg by mouth daily.      Marland Kitchen docusate sodium (COLACE) 100 MG capsule Take 100 mg by mouth 2 (two) times daily.      . ferrous sulfate dried (SLOW FE) 160 (50 FE) MG TBCR SR tablet Take 640 mg by mouth daily.      Marland Kitchen  hydrALAZINE (APRESOLINE) 100 MG tablet Take 100 mg by mouth 3 (three) times daily.       . hydrALAZINE (APRESOLINE) 50 MG tablet     . labetalol (NORMODYNE) 300 MG tablet Take 1 tablet (300 mg total) by mouth 3 (three) times daily.    Marland Kitchen levothyroxine (SYNTHROID, LEVOTHROID) 50 MCG tablet Take 1 tablet (50 mcg total) by mouth daily.    . meclizine (ANTIVERT) 25 MG tablet Take 25 mg by mouth 3 (three) times daily as needed for dizziness.    Marland Kitchen NIFEdipine (PROCARDIA XL/ADALAT-CC) 90 MG 24 hr tablet Take 1 tablet (90 mg total) by mouth daily.    Marland Kitchen omeprazole (PRILOSEC) 20 MG capsule Take 20 mg by mouth daily.    .  ondansetron (ZOFRAN) 4 MG tablet Take 4 mg by mouth every 8 (eight) hours as needed for nausea or vomiting.    Marland Kitchen oxyCODONE-acetaminophen (PERCOCET/ROXICET) 5-325 MG per tablet     . sodium bicarbonate 650 MG tablet Take 1 tablet (650 mg total) by mouth 2 (two) times daily.    . Sorbitol SOLN Take 30 mLs by mouth at bedtime as needed.         Review of Systems     Objective:   Physical Exam  Vitals reviewed. Constitutional: She is oriented to person, place, and time. She appears well-developed and well-nourished. No distress.  HENT:  Head: Normocephalic and atraumatic.  Mouth/Throat: Oropharynx is clear and moist. No oropharyngeal exudate.  Eyes: Pupils are equal, round, and reactive to light. No scleral icterus.  Neck: Normal range of motion. Neck supple.  Cardiovascular: Normal rate, regular rhythm and normal heart sounds.   Pulmonary/Chest: Effort normal and breath sounds normal. No respiratory distress.  Abdominal: Soft. Bowel sounds are normal. She exhibits no distension. There is no tenderness.  Musculoskeletal: She exhibits no edema.  Lymphadenopathy:    She has no cervical adenopathy.  Neurological: She is alert and oriented to person, place, and time.  NO FOCAL DEFICITS   Psychiatric: She has a normal mood and affect.          Assessment & Plan:

## 2014-05-01 NOTE — Assessment & Plan Note (Signed)
STOP SORBITOL. ADD LINZESS 290 MCG DAILY. IF NO SATISFACTORY BM EVERY OTHER DAY, USE DULCOLAX PRN. EAT FIBER. AVOID ITEMS THAT CAUSE BLOATING & GAS. FOLLOW UP IN 3 MOS.

## 2014-05-01 NOTE — Patient Instructions (Addendum)
I SPOKE WITH DR. Hinda Lenis. COLLECT URINE Sunday AND BRING TO DIALYSIS/DROP OFF AT LAB.  STOP SORBITOL.  ADD LINZESS 30 MINS PRIOR TO MEALS. IF NO SATISFACTORY BOWEL MOVEMENT THEN TAKE WITH MEALS AFTER 2 WEEKS. IT CAN CAUSE EXPLOSIVE WATERY DIARRHEA. USE DULCOLAX IF YOU DO NOT HAVE A BM EVERY OTHER DAY.  EAT FIBER. AVOID ITEM THAT CAUSE BLOATING AND GAS. SEE INFO BELOW.  USE ANUSOL CREAM TWICE DAILY FOR 12 DAYS. CALL IN 4-6 WEEKS IF YOUR SYMPTOMS ARE NOT IMPROVED.  FOLLOW UP IN 4 MOS.   High-Fiber Diet A high-fiber diet changes your normal diet to include more whole grains, legumes, fruits, and vegetables. Changes in the diet involve replacing refined carbohydrates with unrefined foods. The calorie level of the diet is essentially unchanged. The Dietary Reference Intake (recommended amount) for adult males is 38 grams per day. For adult females, it is 25 grams per day. Pregnant and lactating women should consume 28 grams of fiber per day. Fiber is the intact part of a plant that is not broken down during digestion. Functional fiber is fiber that has been isolated from the plant to provide a beneficial effect in the body. PURPOSE  Increase stool bulk.   Ease and regulate bowel movements.   Lower cholesterol.  INDICATIONS THAT YOU NEED MORE FIBER  Constipation and hemorrhoids.   Uncomplicated diverticulosis (intestine condition) and irritable bowel syndrome.   Weight management.   As a protective measure against hardening of the arteries (atherosclerosis), diabetes, and cancer.   GUIDELINES FOR INCREASING FIBER IN THE DIET  Start adding fiber to the diet slowly. A gradual increase of about 5 more grams (2 slices of whole-wheat bread, 2 servings of most fruits or vegetables, or 1 bowl of high-fiber cereal) per day is best. Too rapid an increase in fiber may result in constipation, flatulence, and bloating.   Drink enough water and fluids to keep your urine clear or pale yellow. Water,  juice, or caffeine-free drinks are recommended. Not drinking enough fluid may cause constipation.   Eat a variety of high-fiber foods rather than one type of fiber.   Try to increase your intake of fiber through using high-fiber foods rather than fiber pills or supplements that contain small amounts of fiber.   The goal is to change the types of food eaten. Do not supplement your present diet with high-fiber foods, but replace foods in your present diet.  INCLUDE A VARIETY OF FIBER SOURCES  Replace refined and processed grains with whole grains, canned fruits with fresh fruits, and incorporate other fiber sources. White rice, white breads, and most bakery goods contain little or no fiber.   Brown whole-grain rice, buckwheat oats, and many fruits and vegetables are all good sources of fiber. These include: broccoli, Brussels sprouts, cabbage, cauliflower, beets, sweet potatoes, white potatoes (skin on), carrots, tomatoes, eggplant, squash, berries, fresh fruits, and dried fruits.   Cereals appear to be the richest source of fiber. Cereal fiber is found in whole grains and bran. Bran is the fiber-rich outer coat of cereal grain, which is largely removed in refining. In whole-grain cereals, the bran remains. In breakfast cereals, the largest amount of fiber is found in those with "bran" in their names. The fiber content is sometimes indicated on the label.   You may need to include additional fruits and vegetables each day.   In baking, for 1 cup white flour, you may use the following substitutions:   1 cup whole-wheat flour minus 2  tablespoons.   1/2 cup white flour plus 1/2 cup whole-wheat flour.

## 2014-05-01 NOTE — Assessment & Plan Note (Signed)
SPOKE WITH DR. Hinda Lenis. DISCUSSED WHAT PT COMMUNICATED WITH ME.  PT SHOULD COLLECT URINE FOR 24 HRS AND HE WILL RE-ASSESS HER NEED FOR DIALYSIS. OPV IN 4 MOS.

## 2014-05-02 DIAGNOSIS — Z992 Dependence on renal dialysis: Secondary | ICD-10-CM | POA: Diagnosis not present

## 2014-05-02 DIAGNOSIS — D509 Iron deficiency anemia, unspecified: Secondary | ICD-10-CM | POA: Diagnosis not present

## 2014-05-02 DIAGNOSIS — Z23 Encounter for immunization: Secondary | ICD-10-CM | POA: Diagnosis not present

## 2014-05-02 DIAGNOSIS — D631 Anemia in chronic kidney disease: Secondary | ICD-10-CM | POA: Diagnosis not present

## 2014-05-02 DIAGNOSIS — N2581 Secondary hyperparathyroidism of renal origin: Secondary | ICD-10-CM | POA: Diagnosis not present

## 2014-05-02 DIAGNOSIS — N186 End stage renal disease: Secondary | ICD-10-CM | POA: Diagnosis not present

## 2014-05-04 DIAGNOSIS — N2581 Secondary hyperparathyroidism of renal origin: Secondary | ICD-10-CM | POA: Diagnosis not present

## 2014-05-04 DIAGNOSIS — Z23 Encounter for immunization: Secondary | ICD-10-CM | POA: Diagnosis not present

## 2014-05-04 DIAGNOSIS — D631 Anemia in chronic kidney disease: Secondary | ICD-10-CM | POA: Diagnosis not present

## 2014-05-04 DIAGNOSIS — Z992 Dependence on renal dialysis: Secondary | ICD-10-CM | POA: Diagnosis not present

## 2014-05-04 DIAGNOSIS — D509 Iron deficiency anemia, unspecified: Secondary | ICD-10-CM | POA: Diagnosis not present

## 2014-05-04 DIAGNOSIS — N186 End stage renal disease: Secondary | ICD-10-CM | POA: Diagnosis not present

## 2014-05-07 DIAGNOSIS — N2581 Secondary hyperparathyroidism of renal origin: Secondary | ICD-10-CM | POA: Diagnosis not present

## 2014-05-07 DIAGNOSIS — Z992 Dependence on renal dialysis: Secondary | ICD-10-CM | POA: Diagnosis not present

## 2014-05-07 DIAGNOSIS — D509 Iron deficiency anemia, unspecified: Secondary | ICD-10-CM | POA: Diagnosis not present

## 2014-05-07 DIAGNOSIS — N186 End stage renal disease: Secondary | ICD-10-CM | POA: Diagnosis not present

## 2014-05-07 DIAGNOSIS — Z23 Encounter for immunization: Secondary | ICD-10-CM | POA: Diagnosis not present

## 2014-05-07 DIAGNOSIS — D631 Anemia in chronic kidney disease: Secondary | ICD-10-CM | POA: Diagnosis not present

## 2014-05-09 DIAGNOSIS — Z992 Dependence on renal dialysis: Secondary | ICD-10-CM | POA: Diagnosis not present

## 2014-05-09 DIAGNOSIS — N186 End stage renal disease: Secondary | ICD-10-CM | POA: Diagnosis not present

## 2014-05-09 DIAGNOSIS — N2581 Secondary hyperparathyroidism of renal origin: Secondary | ICD-10-CM | POA: Diagnosis not present

## 2014-05-09 DIAGNOSIS — D631 Anemia in chronic kidney disease: Secondary | ICD-10-CM | POA: Diagnosis not present

## 2014-05-09 DIAGNOSIS — Z23 Encounter for immunization: Secondary | ICD-10-CM | POA: Diagnosis not present

## 2014-05-09 DIAGNOSIS — D509 Iron deficiency anemia, unspecified: Secondary | ICD-10-CM | POA: Diagnosis not present

## 2014-05-11 DIAGNOSIS — D631 Anemia in chronic kidney disease: Secondary | ICD-10-CM | POA: Diagnosis not present

## 2014-05-11 DIAGNOSIS — Z23 Encounter for immunization: Secondary | ICD-10-CM | POA: Diagnosis not present

## 2014-05-11 DIAGNOSIS — Z992 Dependence on renal dialysis: Secondary | ICD-10-CM | POA: Diagnosis not present

## 2014-05-11 DIAGNOSIS — N2581 Secondary hyperparathyroidism of renal origin: Secondary | ICD-10-CM | POA: Diagnosis not present

## 2014-05-11 DIAGNOSIS — D509 Iron deficiency anemia, unspecified: Secondary | ICD-10-CM | POA: Diagnosis not present

## 2014-05-11 DIAGNOSIS — N186 End stage renal disease: Secondary | ICD-10-CM | POA: Diagnosis not present

## 2014-05-14 ENCOUNTER — Ambulatory Visit: Payer: BC Managed Care – PPO | Admitting: Gastroenterology

## 2014-05-14 DIAGNOSIS — D509 Iron deficiency anemia, unspecified: Secondary | ICD-10-CM | POA: Diagnosis not present

## 2014-05-14 DIAGNOSIS — N186 End stage renal disease: Secondary | ICD-10-CM | POA: Diagnosis not present

## 2014-05-14 DIAGNOSIS — Z23 Encounter for immunization: Secondary | ICD-10-CM | POA: Diagnosis not present

## 2014-05-14 DIAGNOSIS — N2581 Secondary hyperparathyroidism of renal origin: Secondary | ICD-10-CM | POA: Diagnosis not present

## 2014-05-14 DIAGNOSIS — Z992 Dependence on renal dialysis: Secondary | ICD-10-CM | POA: Diagnosis not present

## 2014-05-14 DIAGNOSIS — D631 Anemia in chronic kidney disease: Secondary | ICD-10-CM | POA: Diagnosis not present

## 2014-05-16 DIAGNOSIS — N186 End stage renal disease: Secondary | ICD-10-CM | POA: Diagnosis not present

## 2014-05-16 DIAGNOSIS — D509 Iron deficiency anemia, unspecified: Secondary | ICD-10-CM | POA: Diagnosis not present

## 2014-05-16 DIAGNOSIS — N2581 Secondary hyperparathyroidism of renal origin: Secondary | ICD-10-CM | POA: Diagnosis not present

## 2014-05-16 DIAGNOSIS — Z992 Dependence on renal dialysis: Secondary | ICD-10-CM | POA: Diagnosis not present

## 2014-05-16 DIAGNOSIS — D631 Anemia in chronic kidney disease: Secondary | ICD-10-CM | POA: Diagnosis not present

## 2014-05-16 DIAGNOSIS — Z23 Encounter for immunization: Secondary | ICD-10-CM | POA: Diagnosis not present

## 2014-05-18 DIAGNOSIS — Z992 Dependence on renal dialysis: Secondary | ICD-10-CM | POA: Diagnosis not present

## 2014-05-18 DIAGNOSIS — Z23 Encounter for immunization: Secondary | ICD-10-CM | POA: Diagnosis not present

## 2014-05-18 DIAGNOSIS — D631 Anemia in chronic kidney disease: Secondary | ICD-10-CM | POA: Diagnosis not present

## 2014-05-18 DIAGNOSIS — N2581 Secondary hyperparathyroidism of renal origin: Secondary | ICD-10-CM | POA: Diagnosis not present

## 2014-05-18 DIAGNOSIS — N186 End stage renal disease: Secondary | ICD-10-CM | POA: Diagnosis not present

## 2014-05-18 DIAGNOSIS — D509 Iron deficiency anemia, unspecified: Secondary | ICD-10-CM | POA: Diagnosis not present

## 2014-05-21 DIAGNOSIS — Z23 Encounter for immunization: Secondary | ICD-10-CM | POA: Diagnosis not present

## 2014-05-21 DIAGNOSIS — D509 Iron deficiency anemia, unspecified: Secondary | ICD-10-CM | POA: Diagnosis not present

## 2014-05-21 DIAGNOSIS — N2581 Secondary hyperparathyroidism of renal origin: Secondary | ICD-10-CM | POA: Diagnosis not present

## 2014-05-21 DIAGNOSIS — N186 End stage renal disease: Secondary | ICD-10-CM | POA: Diagnosis not present

## 2014-05-21 DIAGNOSIS — D631 Anemia in chronic kidney disease: Secondary | ICD-10-CM | POA: Diagnosis not present

## 2014-05-21 DIAGNOSIS — Z992 Dependence on renal dialysis: Secondary | ICD-10-CM | POA: Diagnosis not present

## 2014-05-23 DIAGNOSIS — D631 Anemia in chronic kidney disease: Secondary | ICD-10-CM | POA: Diagnosis not present

## 2014-05-23 DIAGNOSIS — N2581 Secondary hyperparathyroidism of renal origin: Secondary | ICD-10-CM | POA: Diagnosis not present

## 2014-05-23 DIAGNOSIS — N186 End stage renal disease: Secondary | ICD-10-CM | POA: Diagnosis not present

## 2014-05-23 DIAGNOSIS — D509 Iron deficiency anemia, unspecified: Secondary | ICD-10-CM | POA: Diagnosis not present

## 2014-05-23 DIAGNOSIS — Z992 Dependence on renal dialysis: Secondary | ICD-10-CM | POA: Diagnosis not present

## 2014-05-23 DIAGNOSIS — Z23 Encounter for immunization: Secondary | ICD-10-CM | POA: Diagnosis not present

## 2014-05-25 DIAGNOSIS — Z992 Dependence on renal dialysis: Secondary | ICD-10-CM | POA: Diagnosis not present

## 2014-05-25 DIAGNOSIS — D631 Anemia in chronic kidney disease: Secondary | ICD-10-CM | POA: Diagnosis not present

## 2014-05-25 DIAGNOSIS — N186 End stage renal disease: Secondary | ICD-10-CM | POA: Diagnosis not present

## 2014-05-25 DIAGNOSIS — Z23 Encounter for immunization: Secondary | ICD-10-CM | POA: Diagnosis not present

## 2014-05-25 DIAGNOSIS — N2581 Secondary hyperparathyroidism of renal origin: Secondary | ICD-10-CM | POA: Diagnosis not present

## 2014-05-25 DIAGNOSIS — D509 Iron deficiency anemia, unspecified: Secondary | ICD-10-CM | POA: Diagnosis not present

## 2014-05-28 DIAGNOSIS — Z992 Dependence on renal dialysis: Secondary | ICD-10-CM | POA: Diagnosis not present

## 2014-05-28 DIAGNOSIS — D509 Iron deficiency anemia, unspecified: Secondary | ICD-10-CM | POA: Diagnosis not present

## 2014-05-28 DIAGNOSIS — N2581 Secondary hyperparathyroidism of renal origin: Secondary | ICD-10-CM | POA: Diagnosis not present

## 2014-05-28 DIAGNOSIS — D631 Anemia in chronic kidney disease: Secondary | ICD-10-CM | POA: Diagnosis not present

## 2014-05-28 DIAGNOSIS — Z23 Encounter for immunization: Secondary | ICD-10-CM | POA: Diagnosis not present

## 2014-05-28 DIAGNOSIS — N186 End stage renal disease: Secondary | ICD-10-CM | POA: Diagnosis not present

## 2014-05-30 DIAGNOSIS — Z23 Encounter for immunization: Secondary | ICD-10-CM | POA: Diagnosis not present

## 2014-05-30 DIAGNOSIS — Z992 Dependence on renal dialysis: Secondary | ICD-10-CM | POA: Diagnosis not present

## 2014-05-30 DIAGNOSIS — D631 Anemia in chronic kidney disease: Secondary | ICD-10-CM | POA: Diagnosis not present

## 2014-05-30 DIAGNOSIS — N186 End stage renal disease: Secondary | ICD-10-CM | POA: Diagnosis not present

## 2014-05-30 DIAGNOSIS — N2581 Secondary hyperparathyroidism of renal origin: Secondary | ICD-10-CM | POA: Diagnosis not present

## 2014-05-30 DIAGNOSIS — D509 Iron deficiency anemia, unspecified: Secondary | ICD-10-CM | POA: Diagnosis not present

## 2014-06-01 DIAGNOSIS — N186 End stage renal disease: Secondary | ICD-10-CM | POA: Diagnosis not present

## 2014-06-01 DIAGNOSIS — N2581 Secondary hyperparathyroidism of renal origin: Secondary | ICD-10-CM | POA: Diagnosis not present

## 2014-06-01 DIAGNOSIS — D509 Iron deficiency anemia, unspecified: Secondary | ICD-10-CM | POA: Diagnosis not present

## 2014-06-01 DIAGNOSIS — Z992 Dependence on renal dialysis: Secondary | ICD-10-CM | POA: Diagnosis not present

## 2014-06-01 DIAGNOSIS — D631 Anemia in chronic kidney disease: Secondary | ICD-10-CM | POA: Diagnosis not present

## 2014-06-01 DIAGNOSIS — Z23 Encounter for immunization: Secondary | ICD-10-CM | POA: Diagnosis not present

## 2014-06-04 DIAGNOSIS — N186 End stage renal disease: Secondary | ICD-10-CM | POA: Diagnosis not present

## 2014-06-04 DIAGNOSIS — N2581 Secondary hyperparathyroidism of renal origin: Secondary | ICD-10-CM | POA: Diagnosis not present

## 2014-06-04 DIAGNOSIS — Z992 Dependence on renal dialysis: Secondary | ICD-10-CM | POA: Diagnosis not present

## 2014-06-04 DIAGNOSIS — D631 Anemia in chronic kidney disease: Secondary | ICD-10-CM | POA: Diagnosis not present

## 2014-06-04 DIAGNOSIS — D509 Iron deficiency anemia, unspecified: Secondary | ICD-10-CM | POA: Diagnosis not present

## 2014-06-06 DIAGNOSIS — D509 Iron deficiency anemia, unspecified: Secondary | ICD-10-CM | POA: Diagnosis not present

## 2014-06-06 DIAGNOSIS — N2581 Secondary hyperparathyroidism of renal origin: Secondary | ICD-10-CM | POA: Diagnosis not present

## 2014-06-06 DIAGNOSIS — N186 End stage renal disease: Secondary | ICD-10-CM | POA: Diagnosis not present

## 2014-06-06 DIAGNOSIS — Z992 Dependence on renal dialysis: Secondary | ICD-10-CM | POA: Diagnosis not present

## 2014-06-06 DIAGNOSIS — D631 Anemia in chronic kidney disease: Secondary | ICD-10-CM | POA: Diagnosis not present

## 2014-06-08 DIAGNOSIS — N2581 Secondary hyperparathyroidism of renal origin: Secondary | ICD-10-CM | POA: Diagnosis not present

## 2014-06-08 DIAGNOSIS — D631 Anemia in chronic kidney disease: Secondary | ICD-10-CM | POA: Diagnosis not present

## 2014-06-08 DIAGNOSIS — N186 End stage renal disease: Secondary | ICD-10-CM | POA: Diagnosis not present

## 2014-06-08 DIAGNOSIS — D509 Iron deficiency anemia, unspecified: Secondary | ICD-10-CM | POA: Diagnosis not present

## 2014-06-08 DIAGNOSIS — Z992 Dependence on renal dialysis: Secondary | ICD-10-CM | POA: Diagnosis not present

## 2014-06-11 ENCOUNTER — Encounter: Payer: Self-pay | Admitting: Gastroenterology

## 2014-06-11 DIAGNOSIS — N186 End stage renal disease: Secondary | ICD-10-CM | POA: Diagnosis not present

## 2014-06-11 DIAGNOSIS — D509 Iron deficiency anemia, unspecified: Secondary | ICD-10-CM | POA: Diagnosis not present

## 2014-06-11 DIAGNOSIS — Z992 Dependence on renal dialysis: Secondary | ICD-10-CM | POA: Diagnosis not present

## 2014-06-11 DIAGNOSIS — D631 Anemia in chronic kidney disease: Secondary | ICD-10-CM | POA: Diagnosis not present

## 2014-06-11 DIAGNOSIS — N2581 Secondary hyperparathyroidism of renal origin: Secondary | ICD-10-CM | POA: Diagnosis not present

## 2014-06-13 DIAGNOSIS — Z992 Dependence on renal dialysis: Secondary | ICD-10-CM | POA: Diagnosis not present

## 2014-06-13 DIAGNOSIS — N2581 Secondary hyperparathyroidism of renal origin: Secondary | ICD-10-CM | POA: Diagnosis not present

## 2014-06-13 DIAGNOSIS — N186 End stage renal disease: Secondary | ICD-10-CM | POA: Diagnosis not present

## 2014-06-13 DIAGNOSIS — D631 Anemia in chronic kidney disease: Secondary | ICD-10-CM | POA: Diagnosis not present

## 2014-06-13 DIAGNOSIS — D509 Iron deficiency anemia, unspecified: Secondary | ICD-10-CM | POA: Diagnosis not present

## 2014-06-15 DIAGNOSIS — D509 Iron deficiency anemia, unspecified: Secondary | ICD-10-CM | POA: Diagnosis not present

## 2014-06-15 DIAGNOSIS — N186 End stage renal disease: Secondary | ICD-10-CM | POA: Diagnosis not present

## 2014-06-15 DIAGNOSIS — Z992 Dependence on renal dialysis: Secondary | ICD-10-CM | POA: Diagnosis not present

## 2014-06-15 DIAGNOSIS — N2581 Secondary hyperparathyroidism of renal origin: Secondary | ICD-10-CM | POA: Diagnosis not present

## 2014-06-15 DIAGNOSIS — D631 Anemia in chronic kidney disease: Secondary | ICD-10-CM | POA: Diagnosis not present

## 2014-06-18 DIAGNOSIS — D631 Anemia in chronic kidney disease: Secondary | ICD-10-CM | POA: Diagnosis not present

## 2014-06-18 DIAGNOSIS — D509 Iron deficiency anemia, unspecified: Secondary | ICD-10-CM | POA: Diagnosis not present

## 2014-06-18 DIAGNOSIS — Z992 Dependence on renal dialysis: Secondary | ICD-10-CM | POA: Diagnosis not present

## 2014-06-18 DIAGNOSIS — N186 End stage renal disease: Secondary | ICD-10-CM | POA: Diagnosis not present

## 2014-06-18 DIAGNOSIS — N2581 Secondary hyperparathyroidism of renal origin: Secondary | ICD-10-CM | POA: Diagnosis not present

## 2014-06-20 DIAGNOSIS — D509 Iron deficiency anemia, unspecified: Secondary | ICD-10-CM | POA: Diagnosis not present

## 2014-06-20 DIAGNOSIS — N2581 Secondary hyperparathyroidism of renal origin: Secondary | ICD-10-CM | POA: Diagnosis not present

## 2014-06-20 DIAGNOSIS — N186 End stage renal disease: Secondary | ICD-10-CM | POA: Diagnosis not present

## 2014-06-20 DIAGNOSIS — Z992 Dependence on renal dialysis: Secondary | ICD-10-CM | POA: Diagnosis not present

## 2014-06-20 DIAGNOSIS — D631 Anemia in chronic kidney disease: Secondary | ICD-10-CM | POA: Diagnosis not present

## 2014-06-22 DIAGNOSIS — N2581 Secondary hyperparathyroidism of renal origin: Secondary | ICD-10-CM | POA: Diagnosis not present

## 2014-06-22 DIAGNOSIS — D509 Iron deficiency anemia, unspecified: Secondary | ICD-10-CM | POA: Diagnosis not present

## 2014-06-22 DIAGNOSIS — D631 Anemia in chronic kidney disease: Secondary | ICD-10-CM | POA: Diagnosis not present

## 2014-06-22 DIAGNOSIS — Z992 Dependence on renal dialysis: Secondary | ICD-10-CM | POA: Diagnosis not present

## 2014-06-22 DIAGNOSIS — N186 End stage renal disease: Secondary | ICD-10-CM | POA: Diagnosis not present

## 2014-06-25 DIAGNOSIS — D631 Anemia in chronic kidney disease: Secondary | ICD-10-CM | POA: Diagnosis not present

## 2014-06-25 DIAGNOSIS — N2581 Secondary hyperparathyroidism of renal origin: Secondary | ICD-10-CM | POA: Diagnosis not present

## 2014-06-25 DIAGNOSIS — D509 Iron deficiency anemia, unspecified: Secondary | ICD-10-CM | POA: Diagnosis not present

## 2014-06-25 DIAGNOSIS — Z992 Dependence on renal dialysis: Secondary | ICD-10-CM | POA: Diagnosis not present

## 2014-06-25 DIAGNOSIS — N186 End stage renal disease: Secondary | ICD-10-CM | POA: Diagnosis not present

## 2014-06-27 DIAGNOSIS — Z992 Dependence on renal dialysis: Secondary | ICD-10-CM | POA: Diagnosis not present

## 2014-06-27 DIAGNOSIS — N186 End stage renal disease: Secondary | ICD-10-CM | POA: Diagnosis not present

## 2014-06-27 DIAGNOSIS — D631 Anemia in chronic kidney disease: Secondary | ICD-10-CM | POA: Diagnosis not present

## 2014-06-27 DIAGNOSIS — D509 Iron deficiency anemia, unspecified: Secondary | ICD-10-CM | POA: Diagnosis not present

## 2014-06-27 DIAGNOSIS — N2581 Secondary hyperparathyroidism of renal origin: Secondary | ICD-10-CM | POA: Diagnosis not present

## 2014-06-29 DIAGNOSIS — N2581 Secondary hyperparathyroidism of renal origin: Secondary | ICD-10-CM | POA: Diagnosis not present

## 2014-06-29 DIAGNOSIS — N186 End stage renal disease: Secondary | ICD-10-CM | POA: Diagnosis not present

## 2014-06-29 DIAGNOSIS — D509 Iron deficiency anemia, unspecified: Secondary | ICD-10-CM | POA: Diagnosis not present

## 2014-06-29 DIAGNOSIS — Z992 Dependence on renal dialysis: Secondary | ICD-10-CM | POA: Diagnosis not present

## 2014-06-29 DIAGNOSIS — D631 Anemia in chronic kidney disease: Secondary | ICD-10-CM | POA: Diagnosis not present

## 2014-07-01 DIAGNOSIS — N186 End stage renal disease: Secondary | ICD-10-CM | POA: Diagnosis not present

## 2014-07-01 DIAGNOSIS — Z992 Dependence on renal dialysis: Secondary | ICD-10-CM | POA: Diagnosis not present

## 2014-07-02 DIAGNOSIS — D631 Anemia in chronic kidney disease: Secondary | ICD-10-CM | POA: Diagnosis not present

## 2014-07-02 DIAGNOSIS — N186 End stage renal disease: Secondary | ICD-10-CM | POA: Diagnosis not present

## 2014-07-02 DIAGNOSIS — D509 Iron deficiency anemia, unspecified: Secondary | ICD-10-CM | POA: Diagnosis not present

## 2014-07-02 DIAGNOSIS — Z992 Dependence on renal dialysis: Secondary | ICD-10-CM | POA: Diagnosis not present

## 2014-07-02 DIAGNOSIS — N2581 Secondary hyperparathyroidism of renal origin: Secondary | ICD-10-CM | POA: Diagnosis not present

## 2014-07-10 DIAGNOSIS — E063 Autoimmune thyroiditis: Secondary | ICD-10-CM | POA: Diagnosis not present

## 2014-07-10 DIAGNOSIS — I10 Essential (primary) hypertension: Secondary | ICD-10-CM | POA: Diagnosis not present

## 2014-07-10 DIAGNOSIS — Z6825 Body mass index (BMI) 25.0-25.9, adult: Secondary | ICD-10-CM | POA: Diagnosis not present

## 2014-07-10 DIAGNOSIS — N185 Chronic kidney disease, stage 5: Secondary | ICD-10-CM | POA: Diagnosis not present

## 2014-07-10 DIAGNOSIS — R252 Cramp and spasm: Secondary | ICD-10-CM | POA: Diagnosis not present

## 2014-07-10 DIAGNOSIS — M722 Plantar fascial fibromatosis: Secondary | ICD-10-CM | POA: Diagnosis not present

## 2014-07-29 ENCOUNTER — Other Ambulatory Visit: Payer: Self-pay

## 2014-07-29 MED ORDER — OMEPRAZOLE 20 MG PO CPDR: 20.0000 mg | DELAYED_RELEASE_CAPSULE | Freq: Every day | ORAL | Status: DC

## 2014-08-01 DIAGNOSIS — N186 End stage renal disease: Secondary | ICD-10-CM | POA: Diagnosis not present

## 2014-08-01 DIAGNOSIS — Z992 Dependence on renal dialysis: Secondary | ICD-10-CM | POA: Diagnosis not present

## 2014-08-03 DIAGNOSIS — Z992 Dependence on renal dialysis: Secondary | ICD-10-CM | POA: Diagnosis not present

## 2014-08-03 DIAGNOSIS — Z23 Encounter for immunization: Secondary | ICD-10-CM | POA: Diagnosis not present

## 2014-08-03 DIAGNOSIS — D509 Iron deficiency anemia, unspecified: Secondary | ICD-10-CM | POA: Diagnosis not present

## 2014-08-03 DIAGNOSIS — D631 Anemia in chronic kidney disease: Secondary | ICD-10-CM | POA: Diagnosis not present

## 2014-08-03 DIAGNOSIS — N2581 Secondary hyperparathyroidism of renal origin: Secondary | ICD-10-CM | POA: Diagnosis not present

## 2014-08-03 DIAGNOSIS — N186 End stage renal disease: Secondary | ICD-10-CM | POA: Diagnosis not present

## 2014-08-06 DIAGNOSIS — Z992 Dependence on renal dialysis: Secondary | ICD-10-CM | POA: Diagnosis not present

## 2014-08-06 DIAGNOSIS — N186 End stage renal disease: Secondary | ICD-10-CM | POA: Diagnosis not present

## 2014-08-06 DIAGNOSIS — D631 Anemia in chronic kidney disease: Secondary | ICD-10-CM | POA: Diagnosis not present

## 2014-08-06 DIAGNOSIS — N2581 Secondary hyperparathyroidism of renal origin: Secondary | ICD-10-CM | POA: Diagnosis not present

## 2014-08-06 DIAGNOSIS — D509 Iron deficiency anemia, unspecified: Secondary | ICD-10-CM | POA: Diagnosis not present

## 2014-08-06 DIAGNOSIS — Z23 Encounter for immunization: Secondary | ICD-10-CM | POA: Diagnosis not present

## 2014-08-08 DIAGNOSIS — N186 End stage renal disease: Secondary | ICD-10-CM | POA: Diagnosis not present

## 2014-08-08 DIAGNOSIS — D631 Anemia in chronic kidney disease: Secondary | ICD-10-CM | POA: Diagnosis not present

## 2014-08-08 DIAGNOSIS — N2581 Secondary hyperparathyroidism of renal origin: Secondary | ICD-10-CM | POA: Diagnosis not present

## 2014-08-08 DIAGNOSIS — Z23 Encounter for immunization: Secondary | ICD-10-CM | POA: Diagnosis not present

## 2014-08-08 DIAGNOSIS — D509 Iron deficiency anemia, unspecified: Secondary | ICD-10-CM | POA: Diagnosis not present

## 2014-08-08 DIAGNOSIS — Z992 Dependence on renal dialysis: Secondary | ICD-10-CM | POA: Diagnosis not present

## 2014-08-10 DIAGNOSIS — N2581 Secondary hyperparathyroidism of renal origin: Secondary | ICD-10-CM | POA: Diagnosis not present

## 2014-08-10 DIAGNOSIS — Z23 Encounter for immunization: Secondary | ICD-10-CM | POA: Diagnosis not present

## 2014-08-10 DIAGNOSIS — D631 Anemia in chronic kidney disease: Secondary | ICD-10-CM | POA: Diagnosis not present

## 2014-08-10 DIAGNOSIS — Z992 Dependence on renal dialysis: Secondary | ICD-10-CM | POA: Diagnosis not present

## 2014-08-10 DIAGNOSIS — D509 Iron deficiency anemia, unspecified: Secondary | ICD-10-CM | POA: Diagnosis not present

## 2014-08-10 DIAGNOSIS — N186 End stage renal disease: Secondary | ICD-10-CM | POA: Diagnosis not present

## 2014-08-13 DIAGNOSIS — N2581 Secondary hyperparathyroidism of renal origin: Secondary | ICD-10-CM | POA: Diagnosis not present

## 2014-08-13 DIAGNOSIS — N186 End stage renal disease: Secondary | ICD-10-CM | POA: Diagnosis not present

## 2014-08-13 DIAGNOSIS — D631 Anemia in chronic kidney disease: Secondary | ICD-10-CM | POA: Diagnosis not present

## 2014-08-13 DIAGNOSIS — D509 Iron deficiency anemia, unspecified: Secondary | ICD-10-CM | POA: Diagnosis not present

## 2014-08-13 DIAGNOSIS — Z992 Dependence on renal dialysis: Secondary | ICD-10-CM | POA: Diagnosis not present

## 2014-08-13 DIAGNOSIS — Z23 Encounter for immunization: Secondary | ICD-10-CM | POA: Diagnosis not present

## 2014-08-15 DIAGNOSIS — N186 End stage renal disease: Secondary | ICD-10-CM | POA: Diagnosis not present

## 2014-08-15 DIAGNOSIS — N2581 Secondary hyperparathyroidism of renal origin: Secondary | ICD-10-CM | POA: Diagnosis not present

## 2014-08-15 DIAGNOSIS — Z992 Dependence on renal dialysis: Secondary | ICD-10-CM | POA: Diagnosis not present

## 2014-08-15 DIAGNOSIS — Z23 Encounter for immunization: Secondary | ICD-10-CM | POA: Diagnosis not present

## 2014-08-15 DIAGNOSIS — D631 Anemia in chronic kidney disease: Secondary | ICD-10-CM | POA: Diagnosis not present

## 2014-08-15 DIAGNOSIS — D509 Iron deficiency anemia, unspecified: Secondary | ICD-10-CM | POA: Diagnosis not present

## 2014-08-17 DIAGNOSIS — Z23 Encounter for immunization: Secondary | ICD-10-CM | POA: Diagnosis not present

## 2014-08-17 DIAGNOSIS — N186 End stage renal disease: Secondary | ICD-10-CM | POA: Diagnosis not present

## 2014-08-17 DIAGNOSIS — N2581 Secondary hyperparathyroidism of renal origin: Secondary | ICD-10-CM | POA: Diagnosis not present

## 2014-08-17 DIAGNOSIS — D631 Anemia in chronic kidney disease: Secondary | ICD-10-CM | POA: Diagnosis not present

## 2014-08-17 DIAGNOSIS — D509 Iron deficiency anemia, unspecified: Secondary | ICD-10-CM | POA: Diagnosis not present

## 2014-08-17 DIAGNOSIS — Z992 Dependence on renal dialysis: Secondary | ICD-10-CM | POA: Diagnosis not present

## 2014-08-19 ENCOUNTER — Encounter (HOSPITAL_COMMUNITY): Payer: Self-pay | Admitting: *Deleted

## 2014-08-19 ENCOUNTER — Emergency Department (HOSPITAL_COMMUNITY)
Admission: EM | Admit: 2014-08-19 | Discharge: 2014-08-19 | Disposition: A | Discharge status: Home / Self Care | Payer: Medicare Other | Attending: Emergency Medicine | Admitting: Emergency Medicine

## 2014-08-19 DIAGNOSIS — Z7982 Long term (current) use of aspirin: Secondary | ICD-10-CM | POA: Diagnosis not present

## 2014-08-19 DIAGNOSIS — N2581 Secondary hyperparathyroidism of renal origin: Secondary | ICD-10-CM | POA: Insufficient documentation

## 2014-08-19 DIAGNOSIS — N189 Chronic kidney disease, unspecified: Secondary | ICD-10-CM | POA: Insufficient documentation

## 2014-08-19 DIAGNOSIS — D649 Anemia, unspecified: Secondary | ICD-10-CM | POA: Insufficient documentation

## 2014-08-19 DIAGNOSIS — Z8541 Personal history of malignant neoplasm of cervix uteri: Secondary | ICD-10-CM | POA: Insufficient documentation

## 2014-08-19 DIAGNOSIS — E039 Hypothyroidism, unspecified: Secondary | ICD-10-CM | POA: Diagnosis not present

## 2014-08-19 DIAGNOSIS — R42 Dizziness and giddiness: Secondary | ICD-10-CM | POA: Diagnosis not present

## 2014-08-19 DIAGNOSIS — K219 Gastro-esophageal reflux disease without esophagitis: Secondary | ICD-10-CM | POA: Insufficient documentation

## 2014-08-19 DIAGNOSIS — Z79899 Other long term (current) drug therapy: Secondary | ICD-10-CM | POA: Diagnosis not present

## 2014-08-19 DIAGNOSIS — I129 Hypertensive chronic kidney disease with stage 1 through stage 4 chronic kidney disease, or unspecified chronic kidney disease: Secondary | ICD-10-CM | POA: Insufficient documentation

## 2014-08-19 LAB — COMPREHENSIVE METABOLIC PANEL
ALBUMIN: 3.9 g/dL (ref 3.5–5.2)
ALT: 10 U/L (ref 0–35)
AST: 14 U/L (ref 0–37)
Alkaline Phosphatase: 42 U/L (ref 39–117)
Anion gap: 10 (ref 5–15)
BUN: 32 mg/dL — ABNORMAL HIGH (ref 6–23)
CO2: 25 mmol/L (ref 19–32)
Calcium: 9.8 mg/dL (ref 8.4–10.5)
Chloride: 100 mEq/L (ref 96–112)
Creatinine, Ser: 8.07 mg/dL — ABNORMAL HIGH (ref 0.50–1.10)
GFR calc Af Amer: 5 mL/min — ABNORMAL LOW (ref >90)
GFR calc non Af Amer: 5 mL/min — ABNORMAL LOW (ref >90)
Glucose, Bld: 105 mg/dL — ABNORMAL HIGH (ref 70–99)
Potassium: 3.7 mmol/L (ref 3.5–5.1)
Sodium: 135 mmol/L (ref 135–145)
Total Bilirubin: 0.4 mg/dL (ref 0.3–1.2)
Total Protein: 7.4 g/dL (ref 6.0–8.3)

## 2014-08-19 LAB — CBC WITH DIFFERENTIAL/PLATELET
BASOS PCT: 1 % (ref 0–1)
Basophils Absolute: 0 10*3/uL (ref 0.0–0.1)
Eosinophils Absolute: 0.1 10*3/uL (ref 0.0–0.7)
Eosinophils Relative: 3 % (ref 0–5)
HCT: 32.4 % — ABNORMAL LOW (ref 36.0–46.0)
HEMOGLOBIN: 10 g/dL — AB (ref 12.0–15.0)
Lymphocytes Relative: 31 % (ref 12–46)
Lymphs Abs: 1.2 10*3/uL (ref 0.7–4.0)
MCH: 23.7 pg — ABNORMAL LOW (ref 26.0–34.0)
MCHC: 30.9 g/dL (ref 30.0–36.0)
MCV: 76.8 fL — AB (ref 78.0–100.0)
Monocytes Absolute: 0.3 10*3/uL (ref 0.1–1.0)
Monocytes Relative: 8 % (ref 3–12)
NEUTROS ABS: 2.2 10*3/uL (ref 1.7–7.7)
Neutrophils Relative %: 57 % (ref 43–77)
Platelets: 174 10*3/uL (ref 150–400)
RBC: 4.22 MIL/uL (ref 3.87–5.11)
RDW: 21.7 % — ABNORMAL HIGH (ref 11.5–15.5)
WBC: 3.9 10*3/uL — ABNORMAL LOW (ref 4.0–10.5)

## 2014-08-19 LAB — CK: CK TOTAL: 70 U/L (ref 7–177)

## 2014-08-19 LAB — MAGNESIUM: MAGNESIUM: 2 mg/dL (ref 1.5–2.5)

## 2014-08-19 LAB — SEDIMENTATION RATE: Sed Rate: 6 mm/hr (ref 0–22)

## 2014-08-19 LAB — TSH: TSH: 1.351 u[IU]/mL (ref 0.350–4.500)

## 2014-08-19 LAB — PHOSPHORUS: Phosphorus: 4.7 mg/dL — ABNORMAL HIGH (ref 2.3–4.6)

## 2014-08-19 NOTE — ED Notes (Signed)
NP at bedside.

## 2014-08-19 NOTE — Discharge Instructions (Signed)
°  Please use your meclizine as directed for symptom control.  Follow-up with Dr. Gerarda Fraction in the office in the next 2-3 days.  Dizziness  Dizziness means you feel unsteady or lightheaded. You might feel like you are going to pass out (faint). HOME CARE   Drink enough fluids to keep your pee (urine) clear or pale yellow.  Take your medicines exactly as told by your doctor. If you take blood pressure medicine, always stand up slowly from the lying or sitting position. Hold on to something to steady yourself.  If you need to stand in one place for a long time, move your legs often. Tighten and relax your leg muscles.  Have someone stay with you until you feel okay.  Do not drive or use heavy machinery if you feel dizzy.  Do not drink alcohol. GET HELP RIGHT AWAY IF:   You feel dizzy or lightheaded and it gets worse.  You feel sick to your stomach (nauseous), or you throw up (vomit).  You have trouble talking or walking.  You feel weak or have trouble using your arms, hands, or legs.  You cannot think clearly or have trouble forming sentences.  You have chest pain, belly (abdominal) pain, sweating, or you are short of breath.  Your vision changes.  You are bleeding.  You have problems from your medicine that seem to be getting worse. MAKE SURE YOU:   Understand these instructions.  Will watch your condition.  Will get help right away if you are not doing well or get worse. Document Released: 07/08/2011 Document Revised: 10/11/2011 Document Reviewed: 07/08/2011 [facility] Patient Information 2015 [place], [place]. This information is not intended to replace advice given to you by your health care provider. Make sure you discuss any questions you have with your health care provider.

## 2014-08-19 NOTE — ED Notes (Signed)
Pt states that she has had dizziness that changes in intensity since dialysis on Saturday, pt reports worsening dizziness today.

## 2014-08-19 NOTE — ED Provider Notes (Signed)
CSN: [id number]     Arrival date & time 08/19/14  0745 History   First MD Initiated Contact with Patient 08/19/14 0813     Chief Complaint  Patient presents with  . Dizziness     (Consider location/radiation/quality/duration/timing/severity/associated sxs/prior Treatment) Patient is a 64 y.o. female presenting with dizziness. The history is provided by the patient and medical records.  Dizziness Quality:  Head spinning and vertigo Severity:  Moderate Onset quality:  Sudden Duration:  1 day Timing:  Intermittent Progression:  Waxing and waning Chronicity:  Recurrent Context: head movement   Relieved by:  Being still Worsened by:  Movement and standing up Associated symptoms: no chest pain, no headaches, no nausea, no shortness of breath, no vision changes and no vomiting     Past Medical History  Diagnosis Date  . HTN (hypertension)   . Hypothyroidism   . Uterine cancer 2012  . Anemia   . CKD (chronic kidney disease)   . Dysphagia   . Hyperparathyroidism due to renal insufficiency   . GERD (gastroesophageal reflux disease)   . Diverticulitis    Past Surgical History  Procedure Laterality Date  . Cholecystectomy    . Cesarean section    . Laparoscopic total hysterectomy    . Colonoscopy  2000    TICS, [facility]  . Colonoscopy  2003 NUR BRBPR D50 V6    [facility] TICS, [facility]  . Abdominal hysterectomy    . Nasal septum surgery    . Esophagogastroduodenoscopy (egd) with esophageal dilation N/A 06/08/2013    Dr. Fields:Stricture at the gastroesophageal junction/MILD NON-erosive gastritis (inflammation) was found in the gastric antrum; multiple biopsies/NO SOURCE FOR ANEMIA IDETIFIED-MOST LIKELY DUE TO ANEMIA OF CHRONIC DISEASE  . Colonoscopy N/A 09/04/2013    Procedure: COLONOSCOPY;  Surgeon: Danie Binder, MD;  [facility];  Service: Endoscopy;  Laterality: N/A;  10:30AM-moved to 9:25 [name]  . Appendectomy    . Bascilic vein transposition Right 09/17/2013     Procedure: BRACHIAL VEIN TRANSPOSITION;  Surgeon: Conrad Woodbury, MD;  Location: [place];  Service: Vascular;  Laterality: Right;  . Insertion of dialysis catheter Left 09/17/2013    Procedure: INSERTION OF DIALYSIS CATHETER;  Surgeon: Conrad [place], MD;  Location: [place];  Service: Vascular;  Laterality: Left;  . Bascilic vein transposition Right 10/29/2013    Procedure: RIGHT SECOND STAGE BRACHIAL VEIN TRANSPOSITION;  Surgeon: Conrad Rosston, MD;  Location: [facility] OR;  Service: Vascular;  Laterality: Right;  . Givens capsule study N/A 12/03/2013    Procedure: GIVENS CAPSULE STUDY;  Surgeon: Danie Binder, MD;  Location: [facility];  Service: Endoscopy;  Laterality: N/A;  7:30   Family History  Problem Relation Age of Onset  . Colon cancer Neg Hx   . Cancer Mother   . Diabetes Mother   . Hypertension Mother   . Diabetes Father   . Hypertension Father   . Hypertension Sister    History  Substance Use Topics  . Smoking status: Never Smoker   . Smokeless tobacco: Never Used  . Alcohol Use: No   OB History    No data available     Review of Systems  Respiratory: Negative for shortness of breath.   Cardiovascular: Negative for chest pain.  Gastrointestinal: Negative for nausea and vomiting.  Neurological: Positive for dizziness. Negative for syncope, speech difficulty and headaches.  All other systems reviewed and are negative.     Allergies  Cephalexin  Home Medications   Prior to Admission medications   Medication Sig Start Date End Date Taking? Authorizing Provider  allopurinol (ZYLOPRIM) 300 MG tablet Take 300 mg by mouth daily.    Historical Provider, MD  aspirin EC 81 MG tablet Take 81 mg by mouth daily.    Historical Provider, MD  docusate sodium (COLACE) 100 MG capsule Take 100 mg by mouth 2 (two) times daily.    Historical Provider, MD  ferrous sulfate dried (SLOW FE) 160 (50 FE) MG TBCR SR tablet Take 640 mg by mouth daily.    Historical Provider, MD  hydrALAZINE  (APRESOLINE) 100 MG tablet Take 100 mg by mouth 3 (three) times daily.  09/30/13   Historical Provider, MD  hydrALAZINE (APRESOLINE) 50 MG tablet  09/30/13   Historical Provider, MD  labetalol (NORMODYNE) 300 MG tablet Take 1 tablet (300 mg total) by mouth 3 (three) times daily. 08/20/13   Kinnie Feil, MD  levothyroxine (SYNTHROID, LEVOTHROID) 50 MCG tablet Take 1 tablet (50 mcg total) by mouth daily. 08/20/13   Kinnie Feil, MD  Linaclotide (LINZESS) 290 MCG CAPS capsule 1 PO 30 MINS PRIOR TO BREAKFAST. IF O SATISFACTORY BM AFTER 2 WEEKS, TAKE WITH BREAKFAST. 05/01/14   Danie Binder, MD  meclizine (ANTIVERT) 25 MG tablet Take 25 mg by mouth 3 (three) times daily as needed for dizziness.    Historical Provider, MD  NIFEdipine (PROCARDIA XL/ADALAT-CC) 90 MG 24 hr tablet Take 1 tablet (90 mg total) by mouth daily. 08/20/13   Kinnie Feil, MD  omeprazole (PRILOSEC) 20 MG capsule Take 1 capsule (20 mg total) by mouth daily. 07/29/14   Orvil Feil, NP  ondansetron (ZOFRAN) 4 MG tablet Take 4 mg by mouth every 8 (eight) hours as needed for nausea or vomiting.    Historical Provider, MD  oxyCODONE-acetaminophen (PERCOCET/ROXICET) 5-325 MG per tablet  09/17/13   Historical Provider, MD  sodium bicarbonate 650 MG tablet Take 1 tablet (650 mg total) by mouth 2 (two) times daily. 06/12/13   Kathie Dike, MD  Sorbitol SOLN Take 30 mLs by mouth at bedtime as needed.    Historical Provider, MD   BP 164/76 mmHg  Pulse 74  Temp(Src) 97.8 F (36.6 C) (Oral)  Resp 18  Ht 5\' 7"  (1.702 m)  Wt 165 lb (74.844 kg)  BMI 25.84 kg/m2  SpO2 100% Physical Exam  Constitutional: She is oriented to person, place, and time. She appears well-developed and well-nourished.  HENT:  Head: Normocephalic.  Eyes: EOM are normal. Pupils are equal, round, and reactive to light. Right eye exhibits no nystagmus. Left eye exhibits no nystagmus.  Neck: Neck supple.  Cardiovascular: Normal rate and regular rhythm.    Pulmonary/Chest: Effort normal and breath sounds normal.  Abdominal: Soft. Bowel sounds are normal.  Musculoskeletal: She exhibits no edema or tenderness.  Lymphadenopathy:    She has no cervical adenopathy.  Neurological: She is alert and oriented to person, place, and time. She has normal strength. No cranial nerve deficit or sensory deficit. Coordination normal. GCS eye subscore is 4. GCS verbal subscore is 5. GCS motor subscore is 6.  Skin: Skin is warm and dry.  Psychiatric: She has a normal mood and affect.  Nursing note and vitals reviewed.   ED Course  Procedures (including critical care time) Labs Review Labs Reviewed  CBC WITH DIFFERENTIAL  COMPREHENSIVE METABOLIC PANEL    Imaging Review No results found.   EKG Interpretation None  Labs reviewed, results shared with patient.  Discussed patient with Dr. Venora Maples.  Symptoms seem to be vertiginous in origin. No focal neuro findings.  Labs and EKG results reviewed, shared with patient.  Discussed with her PCP Gerarda Fraction), patient has had similar episodes in past and has meclizine for symptom control.  Patient did not use her meclizine today.  PCP will follow-up with patient in office this week.   MDM   Final diagnoses:  None    Dizziness.    Norman Herrlich, NP 08/19/14 [place], MD 08/21/14 [phone]

## 2014-08-19 NOTE — ED Notes (Signed)
PT ambulated to bathroom with stand by assist with no staggering in gait but c/o some dizziness.

## 2014-08-20 DIAGNOSIS — A881 Epidemic vertigo: Secondary | ICD-10-CM | POA: Diagnosis not present

## 2014-08-20 DIAGNOSIS — Z6825 Body mass index (BMI) 25.0-25.9, adult: Secondary | ICD-10-CM | POA: Diagnosis not present

## 2014-08-20 DIAGNOSIS — E063 Autoimmune thyroiditis: Secondary | ICD-10-CM | POA: Diagnosis not present

## 2014-08-20 DIAGNOSIS — I10 Essential (primary) hypertension: Secondary | ICD-10-CM | POA: Diagnosis not present

## 2014-08-20 DIAGNOSIS — M1991 Primary osteoarthritis, unspecified site: Secondary | ICD-10-CM | POA: Diagnosis not present

## 2014-08-21 DIAGNOSIS — Z992 Dependence on renal dialysis: Secondary | ICD-10-CM | POA: Diagnosis not present

## 2014-08-21 DIAGNOSIS — Z23 Encounter for immunization: Secondary | ICD-10-CM | POA: Diagnosis not present

## 2014-08-21 DIAGNOSIS — N2581 Secondary hyperparathyroidism of renal origin: Secondary | ICD-10-CM | POA: Diagnosis not present

## 2014-08-21 DIAGNOSIS — D631 Anemia in chronic kidney disease: Secondary | ICD-10-CM | POA: Diagnosis not present

## 2014-08-21 DIAGNOSIS — N186 End stage renal disease: Secondary | ICD-10-CM | POA: Diagnosis not present

## 2014-08-21 DIAGNOSIS — D509 Iron deficiency anemia, unspecified: Secondary | ICD-10-CM | POA: Diagnosis not present

## 2014-08-22 DIAGNOSIS — N186 End stage renal disease: Secondary | ICD-10-CM | POA: Diagnosis not present

## 2014-08-22 DIAGNOSIS — D509 Iron deficiency anemia, unspecified: Secondary | ICD-10-CM | POA: Diagnosis not present

## 2014-08-22 DIAGNOSIS — Z992 Dependence on renal dialysis: Secondary | ICD-10-CM | POA: Diagnosis not present

## 2014-08-22 DIAGNOSIS — D631 Anemia in chronic kidney disease: Secondary | ICD-10-CM | POA: Diagnosis not present

## 2014-08-22 DIAGNOSIS — N2581 Secondary hyperparathyroidism of renal origin: Secondary | ICD-10-CM | POA: Diagnosis not present

## 2014-08-22 DIAGNOSIS — Z23 Encounter for immunization: Secondary | ICD-10-CM | POA: Diagnosis not present

## 2014-08-25 DIAGNOSIS — Z992 Dependence on renal dialysis: Secondary | ICD-10-CM | POA: Diagnosis not present

## 2014-08-25 DIAGNOSIS — N2581 Secondary hyperparathyroidism of renal origin: Secondary | ICD-10-CM | POA: Diagnosis not present

## 2014-08-25 DIAGNOSIS — N186 End stage renal disease: Secondary | ICD-10-CM | POA: Diagnosis not present

## 2014-08-25 DIAGNOSIS — D509 Iron deficiency anemia, unspecified: Secondary | ICD-10-CM | POA: Diagnosis not present

## 2014-08-25 DIAGNOSIS — D631 Anemia in chronic kidney disease: Secondary | ICD-10-CM | POA: Diagnosis not present

## 2014-08-25 DIAGNOSIS — Z23 Encounter for immunization: Secondary | ICD-10-CM | POA: Diagnosis not present

## 2014-08-27 DIAGNOSIS — D509 Iron deficiency anemia, unspecified: Secondary | ICD-10-CM | POA: Diagnosis not present

## 2014-08-27 DIAGNOSIS — Z992 Dependence on renal dialysis: Secondary | ICD-10-CM | POA: Diagnosis not present

## 2014-08-27 DIAGNOSIS — Z23 Encounter for immunization: Secondary | ICD-10-CM | POA: Diagnosis not present

## 2014-08-27 DIAGNOSIS — N186 End stage renal disease: Secondary | ICD-10-CM | POA: Diagnosis not present

## 2014-08-27 DIAGNOSIS — D631 Anemia in chronic kidney disease: Secondary | ICD-10-CM | POA: Diagnosis not present

## 2014-08-27 DIAGNOSIS — N2581 Secondary hyperparathyroidism of renal origin: Secondary | ICD-10-CM | POA: Diagnosis not present

## 2014-08-29 DIAGNOSIS — D509 Iron deficiency anemia, unspecified: Secondary | ICD-10-CM | POA: Diagnosis not present

## 2014-08-29 DIAGNOSIS — N186 End stage renal disease: Secondary | ICD-10-CM | POA: Diagnosis not present

## 2014-08-29 DIAGNOSIS — D631 Anemia in chronic kidney disease: Secondary | ICD-10-CM | POA: Diagnosis not present

## 2014-08-29 DIAGNOSIS — N2581 Secondary hyperparathyroidism of renal origin: Secondary | ICD-10-CM | POA: Diagnosis not present

## 2014-08-29 DIAGNOSIS — Z992 Dependence on renal dialysis: Secondary | ICD-10-CM | POA: Diagnosis not present

## 2014-08-29 DIAGNOSIS — Z23 Encounter for immunization: Secondary | ICD-10-CM | POA: Diagnosis not present

## 2014-08-31 DIAGNOSIS — D509 Iron deficiency anemia, unspecified: Secondary | ICD-10-CM | POA: Diagnosis not present

## 2014-08-31 DIAGNOSIS — N2581 Secondary hyperparathyroidism of renal origin: Secondary | ICD-10-CM | POA: Diagnosis not present

## 2014-08-31 DIAGNOSIS — Z992 Dependence on renal dialysis: Secondary | ICD-10-CM | POA: Diagnosis not present

## 2014-08-31 DIAGNOSIS — N186 End stage renal disease: Secondary | ICD-10-CM | POA: Diagnosis not present

## 2014-08-31 DIAGNOSIS — Z23 Encounter for immunization: Secondary | ICD-10-CM | POA: Diagnosis not present

## 2014-08-31 DIAGNOSIS — D631 Anemia in chronic kidney disease: Secondary | ICD-10-CM | POA: Diagnosis not present

## 2014-09-01 DIAGNOSIS — N186 End stage renal disease: Secondary | ICD-10-CM | POA: Diagnosis not present

## 2014-09-01 DIAGNOSIS — Z992 Dependence on renal dialysis: Secondary | ICD-10-CM | POA: Diagnosis not present

## 2014-09-03 DIAGNOSIS — D631 Anemia in chronic kidney disease: Secondary | ICD-10-CM | POA: Diagnosis not present

## 2014-09-03 DIAGNOSIS — Z23 Encounter for immunization: Secondary | ICD-10-CM | POA: Diagnosis not present

## 2014-09-03 DIAGNOSIS — N186 End stage renal disease: Secondary | ICD-10-CM | POA: Diagnosis not present

## 2014-09-03 DIAGNOSIS — N2581 Secondary hyperparathyroidism of renal origin: Secondary | ICD-10-CM | POA: Diagnosis not present

## 2014-09-03 DIAGNOSIS — D509 Iron deficiency anemia, unspecified: Secondary | ICD-10-CM | POA: Diagnosis not present

## 2014-09-03 DIAGNOSIS — Z992 Dependence on renal dialysis: Secondary | ICD-10-CM | POA: Diagnosis not present

## 2014-09-05 DIAGNOSIS — D509 Iron deficiency anemia, unspecified: Secondary | ICD-10-CM | POA: Diagnosis not present

## 2014-09-05 DIAGNOSIS — D631 Anemia in chronic kidney disease: Secondary | ICD-10-CM | POA: Diagnosis not present

## 2014-09-05 DIAGNOSIS — Z23 Encounter for immunization: Secondary | ICD-10-CM | POA: Diagnosis not present

## 2014-09-05 DIAGNOSIS — Z992 Dependence on renal dialysis: Secondary | ICD-10-CM | POA: Diagnosis not present

## 2014-09-05 DIAGNOSIS — N186 End stage renal disease: Secondary | ICD-10-CM | POA: Diagnosis not present

## 2014-09-05 DIAGNOSIS — N2581 Secondary hyperparathyroidism of renal origin: Secondary | ICD-10-CM | POA: Diagnosis not present

## 2014-09-07 DIAGNOSIS — N186 End stage renal disease: Secondary | ICD-10-CM | POA: Diagnosis not present

## 2014-09-07 DIAGNOSIS — Z992 Dependence on renal dialysis: Secondary | ICD-10-CM | POA: Diagnosis not present

## 2014-09-07 DIAGNOSIS — Z23 Encounter for immunization: Secondary | ICD-10-CM | POA: Diagnosis not present

## 2014-09-07 DIAGNOSIS — N2581 Secondary hyperparathyroidism of renal origin: Secondary | ICD-10-CM | POA: Diagnosis not present

## 2014-09-07 DIAGNOSIS — D631 Anemia in chronic kidney disease: Secondary | ICD-10-CM | POA: Diagnosis not present

## 2014-09-07 DIAGNOSIS — D509 Iron deficiency anemia, unspecified: Secondary | ICD-10-CM | POA: Diagnosis not present

## 2014-09-10 ENCOUNTER — Encounter: Payer: Self-pay | Admitting: Gastroenterology

## 2014-09-10 DIAGNOSIS — D509 Iron deficiency anemia, unspecified: Secondary | ICD-10-CM | POA: Diagnosis not present

## 2014-09-10 DIAGNOSIS — D631 Anemia in chronic kidney disease: Secondary | ICD-10-CM | POA: Diagnosis not present

## 2014-09-10 DIAGNOSIS — N186 End stage renal disease: Secondary | ICD-10-CM | POA: Diagnosis not present

## 2014-09-10 DIAGNOSIS — Z992 Dependence on renal dialysis: Secondary | ICD-10-CM | POA: Diagnosis not present

## 2014-09-10 DIAGNOSIS — Z23 Encounter for immunization: Secondary | ICD-10-CM | POA: Diagnosis not present

## 2014-09-10 DIAGNOSIS — N2581 Secondary hyperparathyroidism of renal origin: Secondary | ICD-10-CM | POA: Diagnosis not present

## 2014-09-12 DIAGNOSIS — D509 Iron deficiency anemia, unspecified: Secondary | ICD-10-CM | POA: Diagnosis not present

## 2014-09-12 DIAGNOSIS — D631 Anemia in chronic kidney disease: Secondary | ICD-10-CM | POA: Diagnosis not present

## 2014-09-12 DIAGNOSIS — Z992 Dependence on renal dialysis: Secondary | ICD-10-CM | POA: Diagnosis not present

## 2014-09-12 DIAGNOSIS — N186 End stage renal disease: Secondary | ICD-10-CM | POA: Diagnosis not present

## 2014-09-12 DIAGNOSIS — N2581 Secondary hyperparathyroidism of renal origin: Secondary | ICD-10-CM | POA: Diagnosis not present

## 2014-09-12 DIAGNOSIS — Z23 Encounter for immunization: Secondary | ICD-10-CM | POA: Diagnosis not present

## 2014-09-14 DIAGNOSIS — N2581 Secondary hyperparathyroidism of renal origin: Secondary | ICD-10-CM | POA: Diagnosis not present

## 2014-09-14 DIAGNOSIS — N186 End stage renal disease: Secondary | ICD-10-CM | POA: Diagnosis not present

## 2014-09-14 DIAGNOSIS — D509 Iron deficiency anemia, unspecified: Secondary | ICD-10-CM | POA: Diagnosis not present

## 2014-09-14 DIAGNOSIS — Z23 Encounter for immunization: Secondary | ICD-10-CM | POA: Diagnosis not present

## 2014-09-14 DIAGNOSIS — D631 Anemia in chronic kidney disease: Secondary | ICD-10-CM | POA: Diagnosis not present

## 2014-09-14 DIAGNOSIS — Z992 Dependence on renal dialysis: Secondary | ICD-10-CM | POA: Diagnosis not present

## 2014-09-17 DIAGNOSIS — D509 Iron deficiency anemia, unspecified: Secondary | ICD-10-CM | POA: Diagnosis not present

## 2014-09-17 DIAGNOSIS — N2581 Secondary hyperparathyroidism of renal origin: Secondary | ICD-10-CM | POA: Diagnosis not present

## 2014-09-17 DIAGNOSIS — Z992 Dependence on renal dialysis: Secondary | ICD-10-CM | POA: Diagnosis not present

## 2014-09-17 DIAGNOSIS — Z23 Encounter for immunization: Secondary | ICD-10-CM | POA: Diagnosis not present

## 2014-09-17 DIAGNOSIS — N186 End stage renal disease: Secondary | ICD-10-CM | POA: Diagnosis not present

## 2014-09-17 DIAGNOSIS — D631 Anemia in chronic kidney disease: Secondary | ICD-10-CM | POA: Diagnosis not present

## 2014-09-19 DIAGNOSIS — Z23 Encounter for immunization: Secondary | ICD-10-CM | POA: Diagnosis not present

## 2014-09-19 DIAGNOSIS — N2581 Secondary hyperparathyroidism of renal origin: Secondary | ICD-10-CM | POA: Diagnosis not present

## 2014-09-19 DIAGNOSIS — Z992 Dependence on renal dialysis: Secondary | ICD-10-CM | POA: Diagnosis not present

## 2014-09-19 DIAGNOSIS — D631 Anemia in chronic kidney disease: Secondary | ICD-10-CM | POA: Diagnosis not present

## 2014-09-19 DIAGNOSIS — D509 Iron deficiency anemia, unspecified: Secondary | ICD-10-CM | POA: Diagnosis not present

## 2014-09-19 DIAGNOSIS — N186 End stage renal disease: Secondary | ICD-10-CM | POA: Diagnosis not present

## 2014-09-21 DIAGNOSIS — N2581 Secondary hyperparathyroidism of renal origin: Secondary | ICD-10-CM | POA: Diagnosis not present

## 2014-09-21 DIAGNOSIS — Z23 Encounter for immunization: Secondary | ICD-10-CM | POA: Diagnosis not present

## 2014-09-21 DIAGNOSIS — D509 Iron deficiency anemia, unspecified: Secondary | ICD-10-CM | POA: Diagnosis not present

## 2014-09-21 DIAGNOSIS — D631 Anemia in chronic kidney disease: Secondary | ICD-10-CM | POA: Diagnosis not present

## 2014-09-21 DIAGNOSIS — N186 End stage renal disease: Secondary | ICD-10-CM | POA: Diagnosis not present

## 2014-09-21 DIAGNOSIS — Z992 Dependence on renal dialysis: Secondary | ICD-10-CM | POA: Diagnosis not present

## 2014-09-24 DIAGNOSIS — Z23 Encounter for immunization: Secondary | ICD-10-CM | POA: Diagnosis not present

## 2014-09-24 DIAGNOSIS — N2581 Secondary hyperparathyroidism of renal origin: Secondary | ICD-10-CM | POA: Diagnosis not present

## 2014-09-24 DIAGNOSIS — N186 End stage renal disease: Secondary | ICD-10-CM | POA: Diagnosis not present

## 2014-09-24 DIAGNOSIS — D631 Anemia in chronic kidney disease: Secondary | ICD-10-CM | POA: Diagnosis not present

## 2014-09-24 DIAGNOSIS — D509 Iron deficiency anemia, unspecified: Secondary | ICD-10-CM | POA: Diagnosis not present

## 2014-09-24 DIAGNOSIS — Z992 Dependence on renal dialysis: Secondary | ICD-10-CM | POA: Diagnosis not present

## 2014-09-26 DIAGNOSIS — D509 Iron deficiency anemia, unspecified: Secondary | ICD-10-CM | POA: Diagnosis not present

## 2014-09-26 DIAGNOSIS — Z992 Dependence on renal dialysis: Secondary | ICD-10-CM | POA: Diagnosis not present

## 2014-09-26 DIAGNOSIS — Z23 Encounter for immunization: Secondary | ICD-10-CM | POA: Diagnosis not present

## 2014-09-26 DIAGNOSIS — N186 End stage renal disease: Secondary | ICD-10-CM | POA: Diagnosis not present

## 2014-09-26 DIAGNOSIS — N2581 Secondary hyperparathyroidism of renal origin: Secondary | ICD-10-CM | POA: Diagnosis not present

## 2014-09-26 DIAGNOSIS — D631 Anemia in chronic kidney disease: Secondary | ICD-10-CM | POA: Diagnosis not present

## 2014-09-28 DIAGNOSIS — Z992 Dependence on renal dialysis: Secondary | ICD-10-CM | POA: Diagnosis not present

## 2014-09-28 DIAGNOSIS — D509 Iron deficiency anemia, unspecified: Secondary | ICD-10-CM | POA: Diagnosis not present

## 2014-09-28 DIAGNOSIS — N186 End stage renal disease: Secondary | ICD-10-CM | POA: Diagnosis not present

## 2014-09-28 DIAGNOSIS — N2581 Secondary hyperparathyroidism of renal origin: Secondary | ICD-10-CM | POA: Diagnosis not present

## 2014-09-28 DIAGNOSIS — D631 Anemia in chronic kidney disease: Secondary | ICD-10-CM | POA: Diagnosis not present

## 2014-09-28 DIAGNOSIS — Z23 Encounter for immunization: Secondary | ICD-10-CM | POA: Diagnosis not present

## 2014-09-30 DIAGNOSIS — N186 End stage renal disease: Secondary | ICD-10-CM | POA: Diagnosis not present

## 2014-09-30 DIAGNOSIS — Z992 Dependence on renal dialysis: Secondary | ICD-10-CM | POA: Diagnosis not present

## 2014-10-01 DIAGNOSIS — N2581 Secondary hyperparathyroidism of renal origin: Secondary | ICD-10-CM | POA: Diagnosis not present

## 2014-10-01 DIAGNOSIS — Z992 Dependence on renal dialysis: Secondary | ICD-10-CM | POA: Diagnosis not present

## 2014-10-01 DIAGNOSIS — D631 Anemia in chronic kidney disease: Secondary | ICD-10-CM | POA: Diagnosis not present

## 2014-10-01 DIAGNOSIS — I619 Nontraumatic intracerebral hemorrhage, unspecified: Secondary | ICD-10-CM

## 2014-10-01 DIAGNOSIS — D509 Iron deficiency anemia, unspecified: Secondary | ICD-10-CM | POA: Diagnosis not present

## 2014-10-01 DIAGNOSIS — Z23 Encounter for immunization: Secondary | ICD-10-CM | POA: Diagnosis not present

## 2014-10-01 DIAGNOSIS — N186 End stage renal disease: Secondary | ICD-10-CM | POA: Diagnosis not present

## 2014-10-01 HISTORY — DX: Nontraumatic intracerebral hemorrhage, unspecified: I61.9

## 2014-10-03 DIAGNOSIS — D509 Iron deficiency anemia, unspecified: Secondary | ICD-10-CM | POA: Diagnosis not present

## 2014-10-03 DIAGNOSIS — N2581 Secondary hyperparathyroidism of renal origin: Secondary | ICD-10-CM | POA: Diagnosis not present

## 2014-10-03 DIAGNOSIS — N186 End stage renal disease: Secondary | ICD-10-CM | POA: Diagnosis not present

## 2014-10-03 DIAGNOSIS — Z992 Dependence on renal dialysis: Secondary | ICD-10-CM | POA: Diagnosis not present

## 2014-10-03 DIAGNOSIS — Z23 Encounter for immunization: Secondary | ICD-10-CM | POA: Diagnosis not present

## 2014-10-03 DIAGNOSIS — D631 Anemia in chronic kidney disease: Secondary | ICD-10-CM | POA: Diagnosis not present

## 2014-10-05 DIAGNOSIS — N2581 Secondary hyperparathyroidism of renal origin: Secondary | ICD-10-CM | POA: Diagnosis not present

## 2014-10-05 DIAGNOSIS — Z992 Dependence on renal dialysis: Secondary | ICD-10-CM | POA: Diagnosis not present

## 2014-10-05 DIAGNOSIS — N186 End stage renal disease: Secondary | ICD-10-CM | POA: Diagnosis not present

## 2014-10-05 DIAGNOSIS — D631 Anemia in chronic kidney disease: Secondary | ICD-10-CM | POA: Diagnosis not present

## 2014-10-05 DIAGNOSIS — D509 Iron deficiency anemia, unspecified: Secondary | ICD-10-CM | POA: Diagnosis not present

## 2014-10-05 DIAGNOSIS — Z23 Encounter for immunization: Secondary | ICD-10-CM | POA: Diagnosis not present

## 2014-10-07 DIAGNOSIS — E119 Type 2 diabetes mellitus without complications: Secondary | ICD-10-CM | POA: Diagnosis not present

## 2014-10-08 ENCOUNTER — Telehealth: Payer: Self-pay | Admitting: Gastroenterology

## 2014-10-08 DIAGNOSIS — Z23 Encounter for immunization: Secondary | ICD-10-CM | POA: Diagnosis not present

## 2014-10-08 DIAGNOSIS — N2581 Secondary hyperparathyroidism of renal origin: Secondary | ICD-10-CM | POA: Diagnosis not present

## 2014-10-08 DIAGNOSIS — D509 Iron deficiency anemia, unspecified: Secondary | ICD-10-CM | POA: Diagnosis not present

## 2014-10-08 DIAGNOSIS — Z992 Dependence on renal dialysis: Secondary | ICD-10-CM | POA: Diagnosis not present

## 2014-10-08 DIAGNOSIS — N186 End stage renal disease: Secondary | ICD-10-CM | POA: Diagnosis not present

## 2014-10-08 DIAGNOSIS — D631 Anemia in chronic kidney disease: Secondary | ICD-10-CM | POA: Diagnosis not present

## 2014-10-08 NOTE — Telephone Encounter (Signed)
Pt called this afternoon and wanted to cancel her OV with SF on Thursday. She said that the [place] she is on is costing her over $100 and she can not afford to keep taking that. I told her that I could send a message to [facility] nurse and see what SF advises for her, but I recommended that she keep her OV for Thursday since SF next available would be towards the end of April. Pt agreed. Please call her back ASAP with SF recommendations about her medication. [id number]

## 2014-10-09 NOTE — Telephone Encounter (Signed)
PLEASE CALL PT. SHE MAY TRY TAKING LINZESS EVERY OTHER DAY & [place] OPV TO APR 2016.

## 2014-10-09 NOTE — Telephone Encounter (Signed)
Forwarding to Dr. Fields to advise! 

## 2014-10-10 ENCOUNTER — Telehealth: Payer: Self-pay | Admitting: Gastroenterology

## 2014-10-10 ENCOUNTER — Encounter: Payer: No Typology Code available for payment source | Admitting: Gastroenterology

## 2014-10-10 ENCOUNTER — Encounter: Payer: Self-pay | Admitting: Gastroenterology

## 2014-10-10 DIAGNOSIS — Z23 Encounter for immunization: Secondary | ICD-10-CM | POA: Diagnosis not present

## 2014-10-10 DIAGNOSIS — Z992 Dependence on renal dialysis: Secondary | ICD-10-CM | POA: Diagnosis not present

## 2014-10-10 DIAGNOSIS — D509 Iron deficiency anemia, unspecified: Secondary | ICD-10-CM | POA: Diagnosis not present

## 2014-10-10 DIAGNOSIS — D631 Anemia in chronic kidney disease: Secondary | ICD-10-CM | POA: Diagnosis not present

## 2014-10-10 DIAGNOSIS — N186 End stage renal disease: Secondary | ICD-10-CM | POA: Diagnosis not present

## 2014-10-10 DIAGNOSIS — N2581 Secondary hyperparathyroidism of renal origin: Secondary | ICD-10-CM | POA: Diagnosis not present

## 2014-10-10 NOTE — Telephone Encounter (Signed)
PATIENT WAS A NO SHOW ON 10/10/14 AND LETTER WAS SENT

## 2014-10-10 NOTE — Progress Notes (Signed)
   Subjective:    Patient ID: Sonya [name], female    DOB: October 25, 1950, 64 y.o.   MRN: [id number]   HPI   Past Medical History  Diagnosis Date  . HTN (hypertension)   . Hypothyroidism   . Uterine cancer 2012  . Anemia   . CKD (chronic kidney disease)   . Dysphagia   . Hyperparathyroidism due to renal insufficiency   . GERD (gastroesophageal reflux disease)   . Diverticulitis     Review of Systems     Objective:   Physical Exam        Assessment & Plan:

## 2014-10-12 DIAGNOSIS — D509 Iron deficiency anemia, unspecified: Secondary | ICD-10-CM | POA: Diagnosis not present

## 2014-10-12 DIAGNOSIS — N186 End stage renal disease: Secondary | ICD-10-CM | POA: Diagnosis not present

## 2014-10-12 DIAGNOSIS — N2581 Secondary hyperparathyroidism of renal origin: Secondary | ICD-10-CM | POA: Diagnosis not present

## 2014-10-12 DIAGNOSIS — D631 Anemia in chronic kidney disease: Secondary | ICD-10-CM | POA: Diagnosis not present

## 2014-10-12 DIAGNOSIS — Z992 Dependence on renal dialysis: Secondary | ICD-10-CM | POA: Diagnosis not present

## 2014-10-12 DIAGNOSIS — Z23 Encounter for immunization: Secondary | ICD-10-CM | POA: Diagnosis not present

## 2014-10-14 ENCOUNTER — Encounter (HOSPITAL_COMMUNITY): Payer: Self-pay

## 2014-10-14 ENCOUNTER — Emergency Department (HOSPITAL_COMMUNITY): Payer: Medicare Other

## 2014-10-14 ENCOUNTER — Inpatient Hospital Stay (HOSPITAL_COMMUNITY)
Admission: EM | Admit: 2014-10-14 | Discharge: 2014-10-16 | DRG: 064 | Disposition: A | Discharge status: Home Health | Payer: Medicare Other | Attending: Internal Medicine | Admitting: Internal Medicine

## 2014-10-14 DIAGNOSIS — Z809 Family history of malignant neoplasm, unspecified: Secondary | ICD-10-CM | POA: Diagnosis not present

## 2014-10-14 DIAGNOSIS — I120 Hypertensive chronic kidney disease with stage 5 chronic kidney disease or end stage renal disease: Secondary | ICD-10-CM | POA: Diagnosis present

## 2014-10-14 DIAGNOSIS — R1319 Other dysphagia: Secondary | ICD-10-CM | POA: Diagnosis not present

## 2014-10-14 DIAGNOSIS — R531 Weakness: Secondary | ICD-10-CM | POA: Diagnosis not present

## 2014-10-14 DIAGNOSIS — E43 Unspecified severe protein-calorie malnutrition: Secondary | ICD-10-CM | POA: Diagnosis present

## 2014-10-14 DIAGNOSIS — R1310 Dysphagia, unspecified: Secondary | ICD-10-CM | POA: Diagnosis not present

## 2014-10-14 DIAGNOSIS — I517 Cardiomegaly: Secondary | ICD-10-CM | POA: Diagnosis not present

## 2014-10-14 DIAGNOSIS — I611 Nontraumatic intracerebral hemorrhage in hemisphere, cortical: Secondary | ICD-10-CM | POA: Diagnosis not present

## 2014-10-14 DIAGNOSIS — D649 Anemia, unspecified: Secondary | ICD-10-CM | POA: Diagnosis present

## 2014-10-14 DIAGNOSIS — I161 Hypertensive emergency: Secondary | ICD-10-CM | POA: Diagnosis present

## 2014-10-14 DIAGNOSIS — R296 Repeated falls: Secondary | ICD-10-CM | POA: Diagnosis present

## 2014-10-14 DIAGNOSIS — E039 Hypothyroidism, unspecified: Secondary | ICD-10-CM | POA: Diagnosis present

## 2014-10-14 DIAGNOSIS — I612 Nontraumatic intracerebral hemorrhage in hemisphere, unspecified: Secondary | ICD-10-CM | POA: Diagnosis not present

## 2014-10-14 DIAGNOSIS — Z992 Dependence on renal dialysis: Secondary | ICD-10-CM | POA: Diagnosis not present

## 2014-10-14 DIAGNOSIS — Z833 Family history of diabetes mellitus: Secondary | ICD-10-CM

## 2014-10-14 DIAGNOSIS — D631 Anemia in chronic kidney disease: Secondary | ICD-10-CM | POA: Diagnosis not present

## 2014-10-14 DIAGNOSIS — N189 Chronic kidney disease, unspecified: Secondary | ICD-10-CM | POA: Diagnosis not present

## 2014-10-14 DIAGNOSIS — K219 Gastro-esophageal reflux disease without esophagitis: Secondary | ICD-10-CM | POA: Diagnosis present

## 2014-10-14 DIAGNOSIS — R51 Headache: Secondary | ICD-10-CM | POA: Diagnosis not present

## 2014-10-14 DIAGNOSIS — I610 Nontraumatic intracerebral hemorrhage in hemisphere, subcortical: Secondary | ICD-10-CM | POA: Diagnosis not present

## 2014-10-14 DIAGNOSIS — Y92009 Unspecified place in unspecified non-institutional (private) residence as the place of occurrence of the external cause: Secondary | ICD-10-CM | POA: Diagnosis not present

## 2014-10-14 DIAGNOSIS — J90 Pleural effusion, not elsewhere classified: Secondary | ICD-10-CM | POA: Diagnosis not present

## 2014-10-14 DIAGNOSIS — Z6821 Body mass index (BMI) 21.0-21.9, adult: Secondary | ICD-10-CM

## 2014-10-14 DIAGNOSIS — Q059 Spina bifida, unspecified: Secondary | ICD-10-CM

## 2014-10-14 DIAGNOSIS — I6521 Occlusion and stenosis of right carotid artery: Secondary | ICD-10-CM | POA: Diagnosis not present

## 2014-10-14 DIAGNOSIS — Z6822 Body mass index (BMI) 22.0-22.9, adult: Secondary | ICD-10-CM | POA: Diagnosis not present

## 2014-10-14 DIAGNOSIS — Z8249 Family history of ischemic heart disease and other diseases of the circulatory system: Secondary | ICD-10-CM

## 2014-10-14 DIAGNOSIS — R519 Headache, unspecified: Secondary | ICD-10-CM

## 2014-10-14 DIAGNOSIS — K5900 Constipation, unspecified: Secondary | ICD-10-CM | POA: Diagnosis present

## 2014-10-14 DIAGNOSIS — I619 Nontraumatic intracerebral hemorrhage, unspecified: Principal | ICD-10-CM

## 2014-10-14 DIAGNOSIS — W19XXXA Unspecified fall, initial encounter: Secondary | ICD-10-CM | POA: Diagnosis present

## 2014-10-14 DIAGNOSIS — G936 Cerebral edema: Secondary | ICD-10-CM | POA: Diagnosis not present

## 2014-10-14 DIAGNOSIS — I10 Essential (primary) hypertension: Secondary | ICD-10-CM | POA: Diagnosis not present

## 2014-10-14 DIAGNOSIS — I6502 Occlusion and stenosis of left vertebral artery: Secondary | ICD-10-CM | POA: Diagnosis not present

## 2014-10-14 DIAGNOSIS — E876 Hypokalemia: Secondary | ICD-10-CM | POA: Diagnosis not present

## 2014-10-14 DIAGNOSIS — R42 Dizziness and giddiness: Secondary | ICD-10-CM | POA: Diagnosis not present

## 2014-10-14 DIAGNOSIS — N186 End stage renal disease: Secondary | ICD-10-CM | POA: Diagnosis not present

## 2014-10-14 DIAGNOSIS — I160 Hypertensive urgency: Secondary | ICD-10-CM | POA: Insufficient documentation

## 2014-10-14 LAB — BRAIN NATRIURETIC PEPTIDE: B Natriuretic Peptide: 1512 pg/mL — ABNORMAL HIGH (ref 0.0–100.0)

## 2014-10-14 LAB — COMPREHENSIVE METABOLIC PANEL
ALBUMIN: 3.5 g/dL (ref 3.5–5.2)
ALT: 9 U/L (ref 0–35)
ANION GAP: 9 (ref 5–15)
AST: 16 U/L (ref 0–37)
Alkaline Phosphatase: 30 U/L — ABNORMAL LOW (ref 39–117)
BUN: 21 mg/dL (ref 6–23)
CHLORIDE: 100 mmol/L (ref 96–112)
CO2: 28 mmol/L (ref 19–32)
CREATININE: 7.56 mg/dL — AB (ref 0.50–1.10)
Calcium: 9.5 mg/dL (ref 8.4–10.5)
GFR calc non Af Amer: 5 mL/min — ABNORMAL LOW (ref >90)
GFR, EST AFRICAN AMERICAN: 6 mL/min — AB (ref >90)
Glucose, Bld: 106 mg/dL — ABNORMAL HIGH (ref 70–99)
POTASSIUM: 3 mmol/L — AB (ref 3.5–5.1)
Sodium: 137 mmol/L (ref 135–145)
Total Bilirubin: 0.5 mg/dL (ref 0.3–1.2)
Total Protein: 6.9 g/dL (ref 6.0–8.3)

## 2014-10-14 LAB — CBC WITH DIFFERENTIAL/PLATELET
BASOS PCT: 1 % (ref 0–1)
Basophils Absolute: 0 10*3/uL (ref 0.0–0.1)
Eosinophils Absolute: 0.1 10*3/uL (ref 0.0–0.7)
Eosinophils Relative: 2 % (ref 0–5)
HEMATOCRIT: 29.1 % — AB (ref 36.0–46.0)
HEMOGLOBIN: 8.6 g/dL — AB (ref 12.0–15.0)
Lymphocytes Relative: 35 % (ref 12–46)
Lymphs Abs: 1.3 10*3/uL (ref 0.7–4.0)
MCH: 22.5 pg — AB (ref 26.0–34.0)
MCHC: 29.6 g/dL — ABNORMAL LOW (ref 30.0–36.0)
MCV: 76 fL — ABNORMAL LOW (ref 78.0–100.0)
MONO ABS: 0.4 10*3/uL (ref 0.1–1.0)
Monocytes Relative: 11 % (ref 3–12)
NEUTROS PCT: 52 % (ref 43–77)
Neutro Abs: 2 10*3/uL (ref 1.7–7.7)
Platelets: 191 10*3/uL (ref 150–400)
RBC: 3.83 MIL/uL — AB (ref 3.87–5.11)
RDW: 21.2 % — AB (ref 11.5–15.5)
WBC: 3.9 10*3/uL — AB (ref 4.0–10.5)

## 2014-10-14 MED ORDER — POLYSACCHARIDE IRON COMPLEX 150 MG PO CAPS
300.0000 mg | ORAL_CAPSULE | Freq: Every day | ORAL | Status: DC
  Administered 2014-10-15 – 2014-10-16 (×2): 300 mg via ORAL
  Filled 2014-10-14 (×2): qty 2

## 2014-10-14 MED ORDER — HYDRALAZINE HCL 25 MG PO TABS
100.0000 mg | ORAL_TABLET | Freq: Three times a day (TID) | ORAL | Status: DC
  Administered 2014-10-15 – 2014-10-16 (×3): 100 mg via ORAL
  Filled 2014-10-14 (×3): qty 4

## 2014-10-14 MED ORDER — ALLOPURINOL 300 MG PO TABS
300.0000 mg | ORAL_TABLET | Freq: Every day | ORAL | Status: DC
  Administered 2014-10-15 – 2014-10-16 (×2): 300 mg via ORAL
  Filled 2014-10-14 (×2): qty 1

## 2014-10-14 MED ORDER — POTASSIUM CHLORIDE CRYS ER 20 MEQ PO TBCR
40.0000 meq | EXTENDED_RELEASE_TABLET | Freq: Two times a day (BID) | ORAL | Status: AC
  Administered 2014-10-15 (×2): 40 meq via ORAL
  Filled 2014-10-14 (×3): qty 2

## 2014-10-14 MED ORDER — LABETALOL HCL 200 MG PO TABS
300.0000 mg | ORAL_TABLET | Freq: Three times a day (TID) | ORAL | Status: DC
  Administered 2014-10-15 – 2014-10-16 (×3): 300 mg via ORAL
  Filled 2014-10-14 (×4): qty 2

## 2014-10-14 MED ORDER — ONDANSETRON HCL 4 MG PO TABS: 4.0000 mg | ORAL_TABLET | Freq: Four times a day (QID) | ORAL | Status: DC | PRN

## 2014-10-14 MED ORDER — LIDOCAINE-PRILOCAINE 2.5-2.5 % EX CREA: 1.0000 "application " | TOPICAL_CREAM | CUTANEOUS | Status: DC | PRN

## 2014-10-14 MED ORDER — SEVELAMER CARBONATE 800 MG PO TABS
2400.0000 mg | ORAL_TABLET | Freq: Three times a day (TID) | ORAL | Status: DC
  Administered 2014-10-15 – 2014-10-16 (×5): 2400 mg via ORAL
  Filled 2014-10-14 (×5): qty 3

## 2014-10-14 MED ORDER — ACETAMINOPHEN 650 MG RE SUPP: 650.0000 mg | Freq: Four times a day (QID) | RECTAL | Status: DC | PRN

## 2014-10-14 MED ORDER — SODIUM CHLORIDE 0.9 % IJ SOLN
3.0000 mL | Freq: Two times a day (BID) | INTRAMUSCULAR | Status: DC
  Administered 2014-10-15 – 2014-10-16 (×3): 3 mL via INTRAVENOUS

## 2014-10-14 MED ORDER — SODIUM CHLORIDE 0.9 % IJ SOLN: 3.0000 mL | INTRAMUSCULAR | Status: DC | PRN

## 2014-10-14 MED ORDER — CLONIDINE HCL 0.2 MG PO TABS: 0.2000 mg | ORAL_TABLET | Freq: Three times a day (TID) | ORAL | Status: DC | PRN

## 2014-10-14 MED ORDER — NEPRO/CARBSTEADY PO LIQD
237.0000 mL | Freq: Two times a day (BID) | ORAL | Status: DC
  Administered 2014-10-15: 237 mL via ORAL

## 2014-10-14 MED ORDER — ACETAMINOPHEN 325 MG PO TABS: 650.0000 mg | ORAL_TABLET | Freq: Four times a day (QID) | ORAL | Status: DC | PRN

## 2014-10-14 MED ORDER — SEVELAMER CARBONATE 800 MG PO TABS: 800.0000 mg | ORAL_TABLET | Freq: Two times a day (BID) | ORAL | Status: DC | PRN

## 2014-10-14 MED ORDER — ONDANSETRON HCL 4 MG/2ML IJ SOLN: 4.0000 mg | Freq: Four times a day (QID) | INTRAMUSCULAR | Status: DC | PRN

## 2014-10-14 MED ORDER — LEVOTHYROXINE SODIUM 50 MCG PO TABS
50.0000 ug | ORAL_TABLET | Freq: Every day | ORAL | Status: DC
  Administered 2014-10-15 – 2014-10-16 (×2): 50 ug via ORAL
  Filled 2014-10-14 (×2): qty 1

## 2014-10-14 MED ORDER — SODIUM CHLORIDE 0.9 % IV SOLN: 250.0000 mL | INTRAVENOUS | Status: DC | PRN

## 2014-10-14 MED ORDER — DOCUSATE SODIUM 100 MG PO CAPS
100.0000 mg | ORAL_CAPSULE | Freq: Two times a day (BID) | ORAL | Status: DC
  Administered 2014-10-15 – 2014-10-16 (×4): 100 mg via ORAL
  Filled 2014-10-14 (×4): qty 1

## 2014-10-14 MED ORDER — PANTOPRAZOLE SODIUM 40 MG PO TBEC
40.0000 mg | DELAYED_RELEASE_TABLET | Freq: Every day | ORAL | Status: DC
  Administered 2014-10-15 – 2014-10-16 (×2): 40 mg via ORAL
  Filled 2014-10-14 (×2): qty 1

## 2014-10-14 MED ORDER — SODIUM BICARBONATE 650 MG PO TABS
650.0000 mg | ORAL_TABLET | Freq: Two times a day (BID) | ORAL | Status: DC
  Administered 2014-10-15 – 2014-10-16 (×4): 650 mg via ORAL
  Filled 2014-10-14 (×4): qty 1

## 2014-10-14 MED ORDER — CLONIDINE HCL 0.2 MG PO TABS
0.2000 mg | ORAL_TABLET | Freq: Once | ORAL | Status: AC
  Administered 2014-10-14: 0.2 mg via ORAL
  Filled 2014-10-14: qty 1

## 2014-10-14 MED ORDER — OXYCODONE-ACETAMINOPHEN 5-325 MG PO TABS: 1.0000 | ORAL_TABLET | Freq: Four times a day (QID) | ORAL | Status: DC | PRN

## 2014-10-14 MED ORDER — NIFEDIPINE ER OSMOTIC RELEASE 30 MG PO TB24
60.0000 mg | ORAL_TABLET | Freq: Two times a day (BID) | ORAL | Status: DC
  Administered 2014-10-15 (×2): 60 mg via ORAL
  Filled 2014-10-14 (×3): qty 2
  Filled 2014-10-14 (×4): qty 1

## 2014-10-14 MED ORDER — MECLIZINE HCL 12.5 MG PO TABS: 25.0000 mg | ORAL_TABLET | Freq: Three times a day (TID) | ORAL | Status: DC | PRN

## 2014-10-14 NOTE — ED Notes (Signed)
Dr. Darrick Meigs at bedside to evaluate patient.

## 2014-10-14 NOTE — ED Notes (Signed)
Pt c/o frequent falls for the past month, just not feeling "well."  Reports this morning went to PCP's office and they wanted to admit her to the hospital for high bp and dizziness but pt refused.  Pt decided to come this afternoon because she says felt "sicker and sicker."     Pt's sister [phone].    Pt also c/o headache.

## 2014-10-14 NOTE — H&P (Signed)
PCP:   [name], MD   Chief Complaint:  Dizziness  HPI: 64 year old female who   has a past medical history of HTN (hypertension); Hypothyroidism; Uterine cancer (2012); Anemia; CKD (chronic kidney disease); Dysphagia; Hyperparathyroidism due to renal insufficiency; GERD (gastroesophageal reflux disease); and Diverticulitis. patient is on hemodialysis for end-stage renal disease on Tuesday Thursday and Saturday. Her last urinalysis was and Saturday, patient says that today in the morning she went to see her primary care doctor because she has been feeling dizzy and her blood pressure has been elevated, patient was found to have elevated blood pressure in the doctor's office and they recommended admission. Patient was observed there for 2 hours and then decided to go home at home patient felt worse was having dizziness headache and generalized malaise also patient says that she has fallen several times over the past week. In the ED CT head was done which showed anterior left temporal lobe encephalocele, so MRI brain was done which showed subacute left external capsule intracerebral hematoma 1115 mm cross section. 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 Neurosurgery was consulted by the ED physician, who recommended no operative management. Patient denies slurred speech, no blurred vision no weakness of extremities. But she has been falling at home and has been dizzy for the past 3 weeks. Patient's blood pressure has not been well controlled as per patient. She did not take her medications for blood pressure this morning. She denies chest pain, intermittent shortness of breath, no nausea vomiting or diarrhea.  Allergies:   Allergies  Allergen Reactions  . Cephalexin Itching      Past Medical History  Diagnosis Date  . HTN (hypertension)   . Hypothyroidism   . Uterine cancer 2012  . Anemia   . CKD (chronic  kidney disease)   . Dysphagia   . Hyperparathyroidism due to renal insufficiency   . GERD (gastroesophageal reflux disease)   . Diverticulitis     Past Surgical History  Procedure Laterality Date  . Cholecystectomy    . Cesarean section    . Laparoscopic total hysterectomy    . Colonoscopy  2000    [facility], [facility]  . Colonoscopy  2003 NUR BRBPR D50 V6    [place] [facility] [facility]  . Abdominal hysterectomy    . Nasal septum surgery    . Esophagogastroduodenoscopy (egd) with esophageal dilation N/A 06/08/2013    Dr. Fields:Stricture at the gastroesophageal junction/MILD NON-erosive gastritis (inflammation) was found in the gastric antrum; multiple biopsies/NO SOURCE FOR ANEMIA IDETIFIED-MOST LIKELY DUE TO ANEMIA OF CHRONIC DISEASE  . Colonoscopy N/A 09/04/2013    Procedure: COLONOSCOPY;  Surgeon: Danie Binder, MD;  [facility];  Service: Endoscopy;  Laterality: N/A;  10:30AM-moved to 9:25 [name]  . Appendectomy    . Bascilic vein transposition Right 09/17/2013    Procedure: BRACHIAL VEIN TRANSPOSITION;  Surgeon: Conrad [place], MD;  Location: [facility];  Service: Vascular;  Laterality: Right;  . Insertion of dialysis catheter Left 09/17/2013    Procedure: INSERTION OF DIALYSIS CATHETER;  Surgeon: Conrad [place], MD;  Location: [facility];  Service: Vascular;  Laterality: Left;  . Bascilic vein transposition Right 10/29/2013    Procedure: RIGHT SECOND STAGE BRACHIAL VEIN TRANSPOSITION;  Surgeon: Conrad [place], MD;  Location: [facility];  Service: Vascular;  Laterality: Right;  . Givens capsule study N/A 12/03/2013    Procedure: GIVENS CAPSULE STUDY;  Surgeon: Danie Binder, MD;  [facility];  Service: Endoscopy;  Laterality: N/A;  7:30    Prior to Admission medications   Medication Sig Start Date End Date Taking? Authorizing Provider  allopurinol (ZYLOPRIM) 300 MG tablet Take 300 mg by mouth daily.   Yes Historical Provider, MD  aspirin EC 81 MG tablet Take 81 mg by mouth daily.   Yes  Historical Provider, MD  docusate sodium (COLACE) 100 MG capsule Take 100 mg by mouth 2 (two) times daily.   Yes Historical Provider, MD  ferrous sulfate dried (SLOW FE) 160 (50 FE) MG TBCR SR tablet Take 640 mg by mouth daily.   Yes Historical Provider, MD  hydrALAZINE (APRESOLINE) 100 MG tablet Take 100 mg by mouth 3 (three) times daily.  09/30/13  Yes Historical Provider, MD  labetalol (NORMODYNE) 300 MG tablet Take 1 tablet (300 mg total) by mouth 3 (three) times daily. 08/20/13  Yes Kinnie Feil, MD  levothyroxine (SYNTHROID, LEVOTHROID) 50 MCG tablet Take 1 tablet (50 mcg total) by mouth daily. 08/20/13  Yes Kinnie Feil, MD  lidocaine-prilocaine (EMLA) cream Apply 1 application topically as needed (on Dialysis days Tues,Thurs.,Sat.).   Yes Historical Provider, MD  Linaclotide (LINZESS) 290 MCG CAPS capsule 1 PO 30 MINS PRIOR TO BREAKFAST. IF O SATISFACTORY BM AFTER 2 WEEKS, TAKE WITH BREAKFAST. Patient taking differently: Take 290 mcg by mouth every morning.  05/01/14  Yes Danie Binder, MD  meclizine (ANTIVERT) 25 MG tablet Take 25 mg by mouth 3 (three) times daily as needed for dizziness.   Yes Historical Provider, MD  NIFEdipine (PROCARDIA) 20 MG capsule Take 60 mg by mouth 2 (two) times daily. 09/19/14  Yes Historical Provider, MD  omeprazole (PRILOSEC) 20 MG capsule Take 1 capsule (20 mg total) by mouth daily. 07/29/14  Yes Orvil Feil, NP  ondansetron (ZOFRAN) 4 MG tablet Take 4 mg by mouth every 8 (eight) hours as needed for nausea or vomiting.   Yes Historical Provider, MD  oxyCODONE-acetaminophen (PERCOCET/ROXICET) 5-325 MG per tablet Take 1 tablet by mouth every 6 (six) hours as needed (pain).  09/17/13  Yes Historical Provider, MD  sevelamer carbonate (RENVELA) 800 MG tablet Take 800-2,400 mg by mouth 3 (three) times daily with meals. Patient takes 1 tablet with snack and 3 tablets with meal   Yes Historical Provider, MD  sodium bicarbonate 650 MG tablet Take 1 tablet (650 mg  total) by mouth 2 (two) times daily. 06/12/13  Yes Kathie Dike, MD  NIFEdipine (PROCARDIA XL/ADALAT-CC) 90 MG 24 hr tablet Take 1 tablet (90 mg total) by mouth daily. Patient not taking: Reported on 10/14/2014 08/20/13   Kinnie Feil, MD    Social History:  reports that she has never smoked. She has never used smokeless tobacco. She reports that she does not drink alcohol or use illicit drugs.  Family History  Problem Relation Age of Onset  . Colon cancer Neg Hx   . Cancer Mother   . Diabetes Mother   . Hypertension Mother   . Diabetes Father   . Hypertension Father   . Hypertension Sister      All the positives are listed in BOLD  Review of Systems:  HEENT: Headache, blurred vision, runny nose, sore throat Neck: Hypothyroidism, hyperthyroidism,,lymphadenopathy Chest : Shortness of breath, history of COPD, Asthma Heart : Chest pain, history of coronary arterey disease GI:  Nausea, vomiting, diarrhea, constipation, GERD GU: Dysuria, urgency, frequency of urination, hematuria Neuro:  Stroke, seizures, syncope Psych: Depression, anxiety, hallucinations   Physical Exam: Blood pressure 202/93, pulse 78, temperature 97.9 F (36.6 C), temperature source Oral, resp. rate 13, weight 64.411 kg (142 lb), SpO2 100 %. Constitutional:   Patient is a well-developed and well-nourished female* in no acute distress and cooperative with exam. Head: Normocephalic and atraumatic Mouth: Mucus membranes moist Eyes: PERRL, EOMI, conjunctivae normal Neck: Supple, No Thyromegaly Cardiovascular: RRR, S1 normal, S2 normal Pulmonary/Chest: CTAB, no wheezes, rales, or rhonchi Abdominal: Soft. Non-tender, non-distended, bowel sounds are normal, no masses, organomegaly, or guarding present.  Neurological: A&O x3, Strength is normal and symmetric bilaterally, cranial nerve II-XII are grossly intact, no focal motor deficit, sensory intact to light touch bilaterally.  Extremities : No Cyanosis,  Clubbing or Edema  Labs on Admission:  Basic Metabolic Panel:  Recent Labs Lab 10/14/14 1608  NA 137  K 3.0*  CL 100  CO2 28  GLUCOSE 106*  BUN 21  CREATININE 7.56*  CALCIUM 9.5   Liver Function Tests:  Recent Labs Lab 10/14/14 1608  AST 16  ALT 9  ALKPHOS 30*  BILITOT 0.5  PROT 6.9  ALBUMIN 3.5   No results for input(s): LIPASE, AMYLASE in the last 168 hours. No results for input(s): AMMONIA in the last 168 hours. CBC:  Recent Labs Lab 10/14/14 1608  WBC 3.9*  NEUTROABS 2.0  HGB 8.6*  HCT 29.1*  MCV 76.0*  PLT 191   Cardiac Enzymes: No results for input(s): CKTOTAL, CKMB, CKMBINDEX, TROPONINI in the last 168 hours.  BNP (last 3 results)  Recent Labs  10/14/14 1608  BNP 1512.0*    ProBNP (last 3 results) No results for input(s): PROBNP in the last 8760 hours.  CBG: No results for input(s): GLUCAP in the last 168 hours.  Radiological Exams on Admission: Dg Chest 2 View  10/14/2014   CLINICAL DATA:  Hypertension and dizziness  EXAM: CHEST  2 VIEW  COMPARISON:  09/17/2013  FINDINGS: There is chronic moderate cardiomegaly and aortic tortuosity.  Trace left pleural effusion which is new. No edema or suspected pneumonia. No pneumothorax. No acute osseous findings.  IMPRESSION: 1. Trace left pleural effusion. 2. Stable cardiomegaly.   Electronically Signed   By: Monte Fantasia M.D.   On: 10/14/2014 16:24   Ct Head Wo Contrast  10/14/2014   CLINICAL DATA:  Frequent falls for 1 month, elevated blood pressure and dizziness today, history hypertension, chronic kidney disease  EXAM: CT HEAD WITHOUT CONTRAST  TECHNIQUE: Contiguous axial images were obtained from the base of the skull through the vertex without intravenous contrast.  COMPARISON:  12/02/2008  FINDINGS: Normal ventricular morphology.  No midline shift or mass effect.  Extensive small vessel chronic ischemic changes of deep cerebral white matter.  No intracranial hemorrhage or evidence acute  infarction.  No extra-axial fluid collection.  Mastoid air cells and middle ear cavities appear clear.  Fluid attenuation mass at identified at LEFT sphenoid sinus increased in size since previous exam now 2.4 x 3.4 cm.  This contains a 10 mm diameter soft tissue component laterally.  This appears to be associated with a defect in the medial wall of the LEFT middle cranial fossa.  Potentially this could represent a small encephalocele with meninges and CSF protruding into the sphenoid sinus rather than a mucosal retention cyst.  Atherosclerotic calcifications of the carotid siphons.  Calvaria intact.  IMPRESSION: Extensive small vessel chronic ischemic changes of deep cerebral white matter.  Progressive LEFT sphenoid sinus opacification  containing fluid components as well as a soft tissue nodular component laterally ; potentially this may represent herniation of meninges and a portion of the anterior LEFT temporal lobe/encephalocele through a defect in the wall the middle cranial fossa into the sphenoid sinus rather than a complicated mucosal retention cyst/mucocele of the sphenoid sinus.  MR imaging with and without contrast recommended for further assessment.  Findings called to Dr. Stark Jock on 10/14/2014 at 0530 hours.   Electronically Signed   By: Lavonia Dana M.D.   On: 10/14/2014 17:31   Mr [name] Nam Head Wo Contrast  10/14/2014   CLINICAL DATA:  Weakness and dizziness for 1 month. History of renal failure. Initial encounter.  EXAM: MRI HEAD WITHOUT CONTRAST  MRA HEAD WITHOUT CONTRAST  TECHNIQUE: Multiplanar, multiecho pulse sequences of the brain and surrounding structures were obtained without intravenous contrast. Angiographic images of the head were obtained using MRA technique without contrast.  COMPARISON:  CT head earlier today.  FINDINGS: MRI HEAD FINDINGS  There is a moderate sized area of restricted diffusion in the external capsule on the LEFT, slightly involving the lentiform nucleus and adjacent to the  posterior limb internal capsule. Cross-sectional measurements are 11 x 15 mm. This lesion is hyperintense on T1, T2, and FLAIR imaging, with isointense central component on gradient sequence and peripheral T2 shortening. I believe this represents a subacute intracerebral hematoma which has become isodense on CT scan as the lesion is virtually inapparent. There is moderate surrounding vasogenic edema which accompanies extensive T2 and FLAIR hyperintensity throughout the remainder the white matter representing baseline chronic microvascular ischemic change.  The greater wing of the sphenoid on the LEFT is dehiscent. There is a gliotic nubbin of LEFT medial temporal lobe measuring 10 x 11 mm cross-section projecting into and expanded sphenoid sinus. Contained T2 hyperintense fluid as overall cross-sectional measurements considerably larger, measuring 33 x 25 mm cross-section. 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 Neurosurgical consultation is warranted.  Marked T2 hyperintensity accompanies the pons and midbrain likely extensive chronic microvascular ischemic change or. The no restricted diffusion is associated. Tiny focus of susceptibility in the RIGHT paramedian pons as seen on image 7 series 8 with slight heterogeneity of signal, could also represent a subacute bleed. Multiple micro bleeds are seen throughout both cerebral and both cerebellar hemispheres of a chronic nature.  No sinus air-fluid level. Negative orbits. Trace BILATERAL mastoid effusions. Carotid, basilar, and vertebral flow voids are maintained.  MRA HEAD FINDINGS  RIGHT ICA: Moderate dolichoectasia of the petrous segment. Minor irregularity of the cavernous segment. Supraclinoid ICA tandem stenoses estimated 50-75%. Normal appearing ICA terminus.  LEFT ICA: Minor irregularity of the petrous and cavernous segments without flow reducing lesion. No supraclinoid or ICA terminus abnormality.  Basilar artery:   Minor irregularity without flow-limiting stenosis.  Vertebral arteries: RIGHT vertebral dominant. Severe stenosis of the distal LEFT vertebral at the basilar junction, estimated 90%.  Anterior cerebral arteries: Moderate irregularity on the RIGHT. LEFT A1 ACA dominant. No distal anterior cerebral stenosis.  Middle cerebral arteries: No proximal M1 stenosis on the RIGHT or LEFT. Moderate irregularity at the M2 and M3 segments consistent with intracranial atherosclerotic disease.  Posterior cerebral arteries: Minor irregularity without flow reducing proximal lesion.  Cerebellar arteries: Moderately diseased LEFT AICA. LEFT PICA not visualized. Normal-appearing superior cerebellar arteries.  IMPRESSION: Suspect subacute LEFT external capsule intracerebral hematoma, 11 x 15 mm cross-section. 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 Contained CSF projects within the enlarged LEFT sphenoid sinus. Neurosurgical consultation is warranted.  Extensive chronic microvascular ischemic change throughout the cerebral hemispheres and brainstem.  Potentially flow reducing stenoses of the supraclinoid ICA, distal LEFT vertebral, and cerebellar vessels.   Electronically Signed   By: Rolla Flatten M.D.   On: 10/14/2014 19:58   Mr Brain Wo Contrast  10/14/2014   CLINICAL DATA:  Weakness and dizziness for 1 month. History of renal failure. Initial encounter.  EXAM: MRI HEAD WITHOUT CONTRAST  MRA HEAD WITHOUT CONTRAST  TECHNIQUE: Multiplanar, multiecho pulse sequences of the brain and surrounding structures were obtained without intravenous contrast. Angiographic images of the head were obtained using MRA technique without contrast.  COMPARISON:  CT head earlier today.  FINDINGS: MRI HEAD FINDINGS  There is a moderate sized  area of restricted diffusion in the external capsule on the LEFT, slightly involving the lentiform nucleus and adjacent to the posterior limb internal capsule. Cross-sectional measurements are 11 x 15 mm. This lesion is hyperintense on T1, T2, and FLAIR imaging, with isointense central component on gradient sequence and peripheral T2 shortening. I believe this represents a subacute intracerebral hematoma which has become isodense on CT scan as the lesion is virtually inapparent. There is moderate surrounding vasogenic edema which accompanies extensive T2 and FLAIR hyperintensity throughout the remainder the white matter representing baseline chronic microvascular ischemic change.  The greater wing of the sphenoid on the LEFT is dehiscent. There is a gliotic nubbin of LEFT medial temporal lobe measuring 10 x 11 mm cross-section projecting into and expanded sphenoid sinus. Contained T2 hyperintense fluid as overall cross-sectional measurements considerably larger, measuring 33 x 25 mm cross-section. 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 Neurosurgical consultation is warranted.  Marked T2 hyperintensity accompanies the pons and midbrain likely extensive chronic microvascular ischemic change or. The no restricted diffusion is associated. Tiny focus of susceptibility in the RIGHT paramedian pons as seen on image 7 series 8 with slight heterogeneity of signal, could also represent a subacute bleed. Multiple micro bleeds are seen throughout both cerebral and both cerebellar hemispheres of a chronic nature.  No sinus air-fluid level. Negative orbits. Trace BILATERAL mastoid effusions. Carotid, basilar, and vertebral flow voids are maintained.  MRA HEAD FINDINGS  RIGHT ICA: Moderate dolichoectasia of the petrous segment. Minor irregularity of the cavernous segment. Supraclinoid ICA tandem stenoses estimated 50-75%. Normal appearing ICA terminus.  LEFT ICA: Minor irregularity of the  petrous and cavernous segments without flow reducing lesion. No supraclinoid or ICA terminus abnormality.  Basilar artery:  Minor irregularity without flow-limiting stenosis.  Vertebral arteries: RIGHT vertebral dominant. Severe stenosis of the distal LEFT vertebral at the basilar junction, estimated 90%.  Anterior cerebral arteries: Moderate irregularity on the RIGHT. LEFT A1 ACA dominant. No distal anterior cerebral stenosis.  Middle cerebral arteries: No proximal M1 stenosis on the RIGHT or LEFT. Moderate irregularity at the M2 and M3 segments consistent with intracranial atherosclerotic disease.  Posterior cerebral arteries: Minor irregularity without flow reducing proximal lesion.  Cerebellar arteries: Moderately diseased LEFT AICA. LEFT PICA not visualized. Normal-appearing superior cerebellar arteries.  IMPRESSION: Suspect subacute LEFT external capsule intracerebral hematoma, 11 x 15 mm cross-section. 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 Contained CSF projects within the enlarged LEFT sphenoid sinus. Neurosurgical consultation is warranted.  Extensive chronic microvascular ischemic change throughout the cerebral hemispheres and brainstem.  Potentially flow reducing stenoses of the supraclinoid ICA, distal LEFT vertebral, and cerebellar vessels.   Electronically Signed   By: Rolla Flatten M.D.   On: 10/14/2014 19:58    EKG: Independently reviewed. Sinus rhythm, prolonged QT interval 553 ms   Assessment/Plan Active Problems:   Hypokalemia   End stage renal disease   Hypertensive emergency   prolonged QT interval   Intracerebral bleed  Small intracerebral hematoma MRI shows small subacute left external capsule intracerebral hematoma, I called and discussed with neurosurgeon on call  Dr. Arnoldo Morale at [facility], who did not recommend any surgical option at this time. He reviewed the MRI films and says that the bleed is very small, and no operative option required at this time. He did point to repeat CT head in a.m. to assess the size of the hematoma. If hematoma is enlarging, consider calling neurosurgeon on call tomorrow morning. Patient has been taking aspirin, will hold aspirin at this time. Neurosurgery recommends to hold aspirin for a week.  Hypertensive emergency Patient has been on multiple medications for hypertension, she did not take her medications this morning. We'll restart her medications. Will also start Catapres 0.2 mg every 8 hours when necessary for BP greater than 160/100.  Prolonged QT interval Patient has QT interval 553 ms, potassium was 3.0 we'll replace potassium and check BMP in a.m. Also check serum magnesium level.  End-stage renal disease Patient is on hemodialysis Tuesdays , Thursdays and Saturdays. Will consult nephrology in a.m. for dialysis tomorrow morning.  Hypothyroidism Continue Synthroid  DVT prophylaxis SCDs  Code status: Patient is full code  Family discussion: Admission, patients condition and plan of care including tests being ordered have been discussed with the patient and her husband and daughter at bedside* who indicate understanding and agree with the plan and Code Status.   Time Spent on Admission: 65 minutes  [facility] Pager: [phone] 10/14/2014, 9:39 PM  If 7PM-7AM, please contact night-coverage  [email or web address]  Password [email or web address]

## 2014-10-14 NOTE — ED Provider Notes (Signed)
CSN: [id number]     Arrival date & time 10/14/14  1516 History   First MD Initiated Contact with Patient 10/14/14 1532     Chief Complaint  Patient presents with  . Hypertension  . Dizziness     (Consider location/radiation/quality/duration/timing/severity/associated sxs/prior Treatment) HPI Comments: Patient is a 64 year old female with history of hypertension, anemia, and end-stage renal disease for which she is on hemodialysis. She presents for a one-week history of generalized weakness, dizziness, headache, and elevated blood pressure. She was seen by her primary Dr. for this this morning and was recommended admission. She was observed there for 2 hours, then decided to go home. Since arriving home she has felt "worse and worse". She is complaining of headache, dizziness, and generalized malaise. She states that she has fallen several times over the past week, however denies having struck her head. She takes no blood thinners with the exception of what she is administered during dialysis. Her last dialysis session was 2 days ago and everything seemed to go well.  Patient is a 64 y.o. female presenting with hypertension. The history is provided by the patient.  Hypertension This is a new problem. Episode onset: One week ago. The problem occurs constantly. The problem has been gradually worsening. Nothing aggravates the symptoms. Nothing relieves the symptoms. She has tried nothing for the symptoms. The treatment provided no relief.    Past Medical History  Diagnosis Date  . HTN (hypertension)   . Hypothyroidism   . Uterine cancer 2012  . Anemia   . CKD (chronic kidney disease)   . Dysphagia   . Hyperparathyroidism due to renal insufficiency   . GERD (gastroesophageal reflux disease)   . Diverticulitis    Past Surgical History  Procedure Laterality Date  . Cholecystectomy    . Cesarean section    . Laparoscopic total hysterectomy    . Colonoscopy  2000    [facility]  .  Colonoscopy  2003 NUR BRBPR D50 V6    [facility]  . Abdominal hysterectomy    . Nasal septum surgery    . Esophagogastroduodenoscopy (egd) with esophageal dilation N/A 06/08/2013    Dr. Fields:Stricture at the gastroesophageal junction/MILD NON-erosive gastritis (inflammation) was found in the gastric antrum; multiple biopsies/NO SOURCE FOR ANEMIA IDETIFIED-MOST LIKELY DUE TO ANEMIA OF CHRONIC DISEASE  . Colonoscopy N/A 09/04/2013    Procedure: COLONOSCOPY;  Surgeon: Danie Binder, MD;  Location: [facility];  Service: Endoscopy;  Laterality: N/A;  10:30AM-moved to 9:25 [name]  . Appendectomy    . Bascilic vein transposition Right 09/17/2013    Procedure: BRACHIAL VEIN TRANSPOSITION;  Surgeon: Conrad Beloit, MD;  Location: [place];  Service: Vascular;  Laterality: Right;  . Insertion of dialysis catheter Left 09/17/2013    Procedure: INSERTION OF DIALYSIS CATHETER;  Surgeon: Conrad [place], MD;  Location: [place];  Service: Vascular;  Laterality: Left;  . Bascilic vein transposition Right 10/29/2013    Procedure: RIGHT SECOND STAGE BRACHIAL VEIN TRANSPOSITION;  Surgeon: Conrad Concrete, MD;  Location: [facility];  Service: Vascular;  Laterality: Right;  . Givens capsule study N/A 12/03/2013    Procedure: GIVENS CAPSULE STUDY;  Surgeon: Danie Binder, MD;  Location: [facility];  Service: Endoscopy;  Laterality: N/A;  7:30   Family History  Problem Relation Age of Onset  . Colon cancer Neg Hx   . Cancer Mother   . Diabetes Mother   . Hypertension Mother   .  Diabetes Father   . Hypertension Father   . Hypertension Sister    History  Substance Use Topics  . Smoking status: Never Smoker   . Smokeless tobacco: Never Used  . Alcohol Use: No   OB History    No data available     Review of Systems  All other systems reviewed and are negative.     Allergies  Cephalexin  Home Medications   Prior to Admission medications   Medication Sig Start Date End Date Taking?  Authorizing Provider  allopurinol (ZYLOPRIM) 300 MG tablet Take 300 mg by mouth daily.    Historical Provider, MD  aspirin EC 81 MG tablet Take 81 mg by mouth daily.    Historical Provider, MD  docusate sodium (COLACE) 100 MG capsule Take 100 mg by mouth 2 (two) times daily.    Historical Provider, MD  ferrous sulfate dried (SLOW FE) 160 (50 FE) MG TBCR SR tablet Take 640 mg by mouth daily.    Historical Provider, MD  hydrALAZINE (APRESOLINE) 100 MG tablet Take 100 mg by mouth 3 (three) times daily.  09/30/13   Historical Provider, MD  labetalol (NORMODYNE) 300 MG tablet Take 1 tablet (300 mg total) by mouth 3 (three) times daily. 08/20/13   Kinnie Feil, MD  levothyroxine (SYNTHROID, LEVOTHROID) 50 MCG tablet Take 1 tablet (50 mcg total) by mouth daily. 08/20/13   Kinnie Feil, MD  lidocaine-prilocaine (EMLA) cream Apply 1 application topically as needed (on Dialysis days Tues,Thurs.,Sat.).    Historical Provider, MD  Linaclotide (LINZESS) 290 MCG CAPS capsule 1 PO 30 MINS PRIOR TO BREAKFAST. IF O SATISFACTORY BM AFTER 2 WEEKS, TAKE WITH BREAKFAST. Patient taking differently: Take 290 mcg by mouth daily.  05/01/14   Danie Binder, MD  meclizine (ANTIVERT) 25 MG tablet Take 25 mg by mouth 3 (three) times daily as needed for dizziness.    Historical Provider, MD  methocarbamol (ROBAXIN) 750 MG tablet Take 750 mg by mouth 3 (three) times daily as needed for muscle spasms.    Historical Provider, MD  NIFEdipine (PROCARDIA XL/ADALAT-CC) 90 MG 24 hr tablet Take 1 tablet (90 mg total) by mouth daily. 08/20/13   Kinnie Feil, MD  omeprazole (PRILOSEC) 20 MG capsule Take 1 capsule (20 mg total) by mouth daily. 07/29/14   Orvil Feil, NP  ondansetron (ZOFRAN) 4 MG tablet Take 4 mg by mouth every 8 (eight) hours as needed for nausea or vomiting.    Historical Provider, MD  oxyCODONE-acetaminophen (PERCOCET/ROXICET) 5-325 MG per tablet Take 1 tablet by mouth every 6 (six) hours as needed (pain).  09/17/13    Historical Provider, MD  sevelamer carbonate (RENVELA) 800 MG tablet Take 800-2,400 mg by mouth 3 (three) times daily with meals. Patient takes 1 tablet with snack and 3 tablets with meal    Historical Provider, MD  sodium bicarbonate 650 MG tablet Take 1 tablet (650 mg total) by mouth 2 (two) times daily. 06/12/13   Kathie Dike, MD   BP 185/90 mmHg  Pulse 82  Temp(Src) 97.9 F (36.6 C) (Oral)  Resp 18  Wt 142 lb (64.411 kg)  SpO2 100% Physical Exam  Constitutional: She is oriented to person, place, and time. She appears well-developed and well-nourished. No distress.  HENT:  Head: Normocephalic and atraumatic.  Mouth/Throat: Oropharynx is clear and moist.  Eyes: EOM are normal. Pupils are equal, round, and reactive to light.  Neck: Normal range of motion. Neck supple.  Cardiovascular: Normal  rate and regular rhythm.  Exam reveals no gallop and no friction rub.   No murmur heard. Pulmonary/Chest: Effort normal and breath sounds normal. No respiratory distress. She has no wheezes.  Abdominal: Soft. Bowel sounds are normal. She exhibits no distension. There is no tenderness.  Musculoskeletal: Normal range of motion.  Lymphadenopathy:    She has no cervical adenopathy.  Neurological: She is alert and oriented to person, place, and time. No cranial nerve deficit. She exhibits normal muscle tone. Coordination normal.  Skin: Skin is warm and dry. She is not diaphoretic.  Nursing note and vitals reviewed.   ED Course  Procedures (including critical care time) Labs Review Labs Reviewed  COMPREHENSIVE METABOLIC PANEL  CBC WITH DIFFERENTIAL/PLATELET  BRAIN NATRIURETIC PEPTIDE    Imaging Review No results found.   EKG Interpretation   Date/Time:  Monday October 14 2014 15:48:47 EDT Ventricular Rate:  80 PR Interval:  154 QRS Duration: 82 QT Interval:  479 QTC Calculation: 553 R Axis:   81 Text Interpretation:  Sinus rhythm Left atrial enlargement Borderline  right axis  deviation Left ventricular hypertrophy Prolonged QT interval  Baseline wander in lead(s) V5 Confirmed by DELOS  MD, Loriana Samad ([id number]) on  10/14/2014 4:00:43 PM      MDM   Final diagnoses:  Weakness  Weakness    Patient is a 64 year old female with history of hypertension and end-stage renal disease. She presents with a one-week history of headache, dizziness, and weakness. Her blood pressures are markedly elevated upon presentation, however her neurologic exam is nonfocal. Workup was initiated and revealed the suggestion of an abnormality on her brain CT scan. An MRI was recommended and obtained which confirmed the presence of an encephalocele and meningocele. There was also a small hemorrhage identified.  I discussed these findings with Dr. Arnoldo Morale from neurosurgery who did not feel as though this required any acute surgical intervention. My plan was to admit to medicine with a diagnosis of hypertensive emergency. She was given clonidine without much improvement. I've spoken with Dr. Darrick Meigs who will evaluate and admit the patient.  CRITICAL CARE Performed by: Veryl Speak Total critical care time: 45 minutes Critical care time was exclusive of separately billable procedures and treating other patients. Critical care was necessary to treat or prevent imminent or life-threatening deterioration. Critical care was time spent personally by me on the following activities: development of treatment plan with patient and/or surrogate as well as nursing, discussions with consultants, evaluation of patient's response to treatment, examination of patient, obtaining history from patient or surrogate, ordering and performing treatments and interventions, ordering and review of laboratory studies, ordering and review of radiographic studies, pulse oximetry and re-evaluation of patient's condition.     Veryl Speak, MD 10/14/14 2126

## 2014-10-15 ENCOUNTER — Inpatient Hospital Stay (HOSPITAL_COMMUNITY): Payer: Medicare Other

## 2014-10-15 DIAGNOSIS — E43 Unspecified severe protein-calorie malnutrition: Secondary | ICD-10-CM | POA: Insufficient documentation

## 2014-10-15 DIAGNOSIS — I611 Nontraumatic intracerebral hemorrhage in hemisphere, cortical: Secondary | ICD-10-CM

## 2014-10-15 DIAGNOSIS — R1310 Dysphagia, unspecified: Secondary | ICD-10-CM

## 2014-10-15 DIAGNOSIS — E876 Hypokalemia: Secondary | ICD-10-CM

## 2014-10-15 DIAGNOSIS — N186 End stage renal disease: Secondary | ICD-10-CM

## 2014-10-15 LAB — COMPREHENSIVE METABOLIC PANEL
ALT: 7 U/L (ref 0–35)
ANION GAP: 10 (ref 5–15)
AST: 12 U/L (ref 0–37)
Albumin: 2.9 g/dL — ABNORMAL LOW (ref 3.5–5.2)
Alkaline Phosphatase: 28 U/L — ABNORMAL LOW (ref 39–117)
BILIRUBIN TOTAL: 0.4 mg/dL (ref 0.3–1.2)
BUN: 23 mg/dL (ref 6–23)
CALCIUM: 8.8 mg/dL (ref 8.4–10.5)
CO2: 27 mmol/L (ref 19–32)
CREATININE: 8.01 mg/dL — AB (ref 0.50–1.10)
Chloride: 101 mmol/L (ref 96–112)
GFR calc Af Amer: 5 mL/min — ABNORMAL LOW (ref >90)
GFR calc non Af Amer: 5 mL/min — ABNORMAL LOW (ref >90)
GLUCOSE: 95 mg/dL (ref 70–99)
Potassium: 3 mmol/L — ABNORMAL LOW (ref 3.5–5.1)
Sodium: 138 mmol/L (ref 135–145)
TOTAL PROTEIN: 5.8 g/dL — AB (ref 6.0–8.3)

## 2014-10-15 LAB — CBC
HCT: 25.5 % — ABNORMAL LOW (ref 36.0–46.0)
HEMOGLOBIN: 7.9 g/dL — AB (ref 12.0–15.0)
MCH: 23.2 pg — ABNORMAL LOW (ref 26.0–34.0)
MCHC: 31 g/dL (ref 30.0–36.0)
MCV: 74.8 fL — AB (ref 78.0–100.0)
Platelets: 176 10*3/uL (ref 150–400)
RBC: 3.41 MIL/uL — ABNORMAL LOW (ref 3.87–5.11)
RDW: 20.9 % — ABNORMAL HIGH (ref 11.5–15.5)
WBC: 4 10*3/uL (ref 4.0–10.5)

## 2014-10-15 LAB — MAGNESIUM: Magnesium: 1.8 mg/dL (ref 1.5–2.5)

## 2014-10-15 MED ORDER — EPOETIN ALFA 4000 UNIT/ML IJ SOLN
4000.0000 [IU] | INTRAMUSCULAR | Status: DC
  Filled 2014-10-15: qty 1

## 2014-10-15 MED ORDER — ENSURE COMPLETE PO LIQD
237.0000 mL | Freq: Two times a day (BID) | ORAL | Status: DC
  Administered 2014-10-15 – 2014-10-16 (×2): 237 mL via ORAL

## 2014-10-15 NOTE — Clinical Social Work Note (Signed)
CSW received consult for medication assistance. CM notified. CSW will sign off, but can be reconsulted if needed.  Ashauna Bertholf, LCSW [phone] 

## 2014-10-15 NOTE — Progress Notes (Signed)
[facility] PROGRESS NOTE  LEIGHTYN RODRIGUEZ [id number] DOB: [birth year] DOA: 10/14/2014 PCP: [name], MD  Assessment/Plan: Intracerebral Hematoma -Stable, per repeat CT scan. -Dr. Arnoldo Morale consulted on admission opined no surgical intervention required. -Follow up with neurosurgery as an OP.  Dizziness -Suspect related to above. -Seen by PT with recommendations for HHPT.  HTN -BP improved today. -Should improve even further with HD.  ESRD -On HD TTS. -Due for HD today. -Appreciate renal input.  Hypokalemia -To correct during HD.  Dysphagia and Weight Loss -With h/o of an esophageal stricture, will request GI consultation for ?esophageal dilatation.  Code Status: Full Code Family Communication: niece at bedside updated on plan of care  Disposition Plan: Home likely in am   Consultants:  Renal   Antibiotics:  None   Subjective: C/o dysphagia and decreased appetite with weight loss.  Objective: Filed Vitals:   10/14/14 2231 10/15/14 0141 10/15/14 0448 10/15/14 1619  BP: 184/73 140/56 134/68 176/75  Pulse: 79 70 71 72  Temp: 98.1 F (36.7 C)  98.4 F (36.9 C) 97.3 F (36.3 C)  TempSrc: Oral  Oral Oral  Resp: 20  20 20   Height: 5\' 8"  (1.727 m)     Weight: 64.2 kg (141 lb 8.6 oz)  64.1 kg (141 lb 5 oz)   SpO2: 100%  100% 99%    Intake/Output Summary (Last 24 hours) at 10/15/14 1752 Last data filed at 10/15/14 0010  Gross per 24 hour  Intake    240 ml  Output      0 ml  Net    240 ml   Filed Weights   10/14/14 1529 10/14/14 2231 10/15/14 0448  Weight: 64.411 kg (142 lb) 64.2 kg (141 lb 8.6 oz) 64.1 kg (141 lb 5 oz)    Exam:   General:  AA Ox3  Cardiovascular: RRR  Respiratory: CTA B  Abdomen: S/NT/ND/+BS  Extremities: no C/C/E   Neurologic:  Non-focal  Data Reviewed: Basic Metabolic Panel:  Recent Labs Lab 10/14/14 1608 10/15/14 0253 10/15/14 0254  NA 137 138  --   K 3.0* 3.0*  --   CL 100 101  --     CO2 28 27  --   GLUCOSE 106* 95  --   BUN 21 23  --   CREATININE 7.56* 8.01*  --   CALCIUM 9.5 8.8  --   MG  --   --  1.8   Liver Function Tests:  Recent Labs Lab 10/14/14 1608 10/15/14 0253  AST 16 12  ALT 9 7  ALKPHOS 30* 28*  BILITOT 0.5 0.4  PROT 6.9 5.8*  ALBUMIN 3.5 2.9*   No results for input(s): LIPASE, AMYLASE in the last 168 hours. No results for input(s): AMMONIA in the last 168 hours. CBC:  Recent Labs Lab 10/14/14 1608 10/15/14 0253  WBC 3.9* 4.0  NEUTROABS 2.0  --   HGB 8.6* 7.9*  HCT 29.1* 25.5*  MCV 76.0* 74.8*  PLT 191 176   Cardiac Enzymes: No results for input(s): CKTOTAL, CKMB, CKMBINDEX, TROPONINI in the last 168 hours. BNP (last 3 results)  Recent Labs  10/14/14 1608  BNP 1512.0*    ProBNP (last 3 results) No results for input(s): PROBNP in the last 8760 hours.  CBG: No results for input(s): GLUCAP in the last 168 hours.  No results found for this or any previous visit (from the past 240 hour(s)).   Studies: Dg Chest 2 View  10/14/2014   CLINICAL  DATA:  Hypertension and dizziness  EXAM: CHEST  2 VIEW  COMPARISON:  09/17/2013  FINDINGS: There is chronic moderate cardiomegaly and aortic tortuosity.  Trace left pleural effusion which is new. No edema or suspected pneumonia. No pneumothorax. No acute osseous findings.  IMPRESSION: 1. Trace left pleural effusion. 2. Stable cardiomegaly.   Electronically Signed   By: Monte Fantasia M.D.   On: 10/14/2014 16:24   Ct Head Wo Contrast  10/15/2014   CLINICAL DATA:  Abnormal MRI with dizziness and weakness for 1 month. Intracranial bleed.  EXAM: CT HEAD WITHOUT CONTRAST  TECHNIQUE: Contiguous axial images were obtained from the base of the skull through the vertex without intravenous contrast.  COMPARISON:  10/14/2014  FINDINGS: Skull and Sinuses:No acute findings.  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 Herniated temporal lobe is better seen on recent MRI imaging.  Orbits: No acute abnormality.  Brain: An isodense to hypodense hemorrhage in or below the posterior left putamen shows no visible enlargement. There is extensive chronic small vessel disease with confluent ischemic gliosis throughout the bilateral cerebral white matter and remote bilateral thalamic infarcts. Marked disease of the pons is better seen on previous MRI. No evidence of acute infarct, hydrocephalus, or shift.  IMPRESSION: 1. Stable appearance of subacute and isodense left basal ganglia hematoma. No acute hemorrhage. 2. Advanced chronic small vessel disease. 3. 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   Electronically Signed   By: Monte Fantasia M.D.   On: 10/15/2014 12:46   Ct Head Wo Contrast  10/14/2014   CLINICAL DATA:  Frequent falls for 1 month, elevated blood pressure and dizziness today, history hypertension, chronic kidney disease  EXAM: CT HEAD WITHOUT CONTRAST  TECHNIQUE: Contiguous axial images were obtained from the base of the skull through the vertex without intravenous contrast.  COMPARISON:  12/02/2008  FINDINGS: Normal ventricular morphology.  No midline shift or mass effect.  Extensive small vessel chronic ischemic changes of deep cerebral white matter.  No intracranial hemorrhage or evidence acute infarction.  No extra-axial fluid collection.  Mastoid air cells and middle ear cavities appear clear.  Fluid attenuation mass at identified at LEFT sphenoid sinus increased in size since previous exam now 2.4 x 3.4 cm.  This contains a 10 mm diameter soft tissue component laterally.  This appears to be associated with a defect in the medial wall of the LEFT middle cranial fossa.  Potentially this could represent a small encephalocele with meninges and CSF protruding into the sphenoid sinus rather than a mucosal retention cyst.  Atherosclerotic  calcifications of the carotid siphons.  Calvaria intact.  IMPRESSION: Extensive small vessel chronic ischemic changes of deep cerebral white matter.  Progressive LEFT sphenoid sinus opacification containing fluid components as well as a soft tissue nodular component laterally ; potentially this may represent herniation of meninges and a portion of the anterior LEFT temporal lobe/encephalocele through a defect in the wall the middle cranial fossa into the sphenoid sinus rather than a complicated mucosal retention cyst/mucocele of the sphenoid sinus.  MR imaging with and without contrast recommended for further assessment.  Findings called to Dr. Stark Jock on 10/14/2014 at 0530 hours.   Electronically Signed   By: Lavonia Dana M.D.   On: 10/14/2014 17:31   Mr [name] Nam Head Wo Contrast  10/14/2014   CLINICAL DATA:  Weakness and dizziness for 1 month.  History of renal failure. Initial encounter.  EXAM: MRI HEAD WITHOUT CONTRAST  MRA HEAD WITHOUT CONTRAST  TECHNIQUE: Multiplanar, multiecho pulse sequences of the brain and surrounding structures were obtained without intravenous contrast. Angiographic images of the head were obtained using MRA technique without contrast.  COMPARISON:  CT head earlier today.  FINDINGS: MRI HEAD FINDINGS  There is a moderate sized area of restricted diffusion in the external capsule on the LEFT, slightly involving the lentiform nucleus and adjacent to the posterior limb internal capsule. Cross-sectional measurements are 11 x 15 mm. This lesion is hyperintense on T1, T2, and FLAIR imaging, with isointense central component on gradient sequence and peripheral T2 shortening. I believe this represents a subacute intracerebral hematoma which has become isodense on CT scan as the lesion is virtually inapparent. There is moderate surrounding vasogenic edema which accompanies extensive T2 and FLAIR hyperintensity throughout the remainder the white matter representing baseline chronic microvascular ischemic  change.  The greater wing of the sphenoid on the LEFT is dehiscent. There is a gliotic nubbin of LEFT medial temporal lobe measuring 10 x 11 mm cross-section projecting into and expanded sphenoid sinus. Contained T2 hyperintense fluid as overall cross-sectional measurements considerably larger, measuring 33 x 25 mm cross-section. 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 Neurosurgical consultation is warranted.  Marked T2 hyperintensity accompanies the pons and midbrain likely extensive chronic microvascular ischemic change or. The no restricted diffusion is associated. Tiny focus of susceptibility in the RIGHT paramedian pons as seen on image 7 series 8 with slight heterogeneity of signal, could also represent a subacute bleed. Multiple micro bleeds are seen throughout both cerebral and both cerebellar hemispheres of a chronic nature.  No sinus air-fluid level. Negative orbits. Trace BILATERAL mastoid effusions. Carotid, basilar, and vertebral flow voids are maintained.  MRA HEAD FINDINGS  RIGHT ICA: Moderate dolichoectasia of the petrous segment. Minor irregularity of the cavernous segment. Supraclinoid ICA tandem stenoses estimated 50-75%. Normal appearing ICA terminus.  LEFT ICA: Minor irregularity of the petrous and cavernous segments without flow reducing lesion. No supraclinoid or ICA terminus abnormality.  Basilar artery:  Minor irregularity without flow-limiting stenosis.  Vertebral arteries: RIGHT vertebral dominant. Severe stenosis of the distal LEFT vertebral at the basilar junction, estimated 90%.  Anterior cerebral arteries: Moderate irregularity on the RIGHT. LEFT A1 ACA dominant. No distal anterior cerebral stenosis.  Middle cerebral arteries: No proximal M1 stenosis on the RIGHT or LEFT. Moderate irregularity at the M2 and M3 segments consistent with intracranial atherosclerotic disease.  Posterior cerebral arteries: Minor irregularity without flow reducing  proximal lesion.  Cerebellar arteries: Moderately diseased LEFT AICA. LEFT PICA not visualized. Normal-appearing superior cerebellar arteries.  IMPRESSION: Suspect subacute LEFT external capsule intracerebral hematoma, 11 x 15 mm cross-section. 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 Contained CSF projects within the enlarged LEFT sphenoid sinus. Neurosurgical consultation is warranted.  Extensive chronic microvascular ischemic change throughout the cerebral hemispheres and brainstem.  Potentially flow reducing stenoses of the supraclinoid ICA, distal LEFT vertebral, and cerebellar vessels.   Electronically Signed   By: Rolla Flatten M.D.   On: 10/14/2014 19:58   Mr Brain Wo Contrast  10/14/2014   CLINICAL DATA:  Weakness and dizziness for 1 month. History of renal failure. Initial encounter.  EXAM: MRI HEAD WITHOUT CONTRAST  MRA HEAD WITHOUT  CONTRAST  TECHNIQUE: Multiplanar, multiecho pulse sequences of the brain and surrounding structures were obtained without intravenous contrast. Angiographic images of the head were obtained using MRA technique without contrast.  COMPARISON:  CT head earlier today.  FINDINGS: MRI HEAD FINDINGS  There is a moderate sized area of restricted diffusion in the external capsule on the LEFT, slightly involving the lentiform nucleus and adjacent to the posterior limb internal capsule. Cross-sectional measurements are 11 x 15 mm. This lesion is hyperintense on T1, T2, and FLAIR imaging, with isointense central component on gradient sequence and peripheral T2 shortening. I believe this represents a subacute intracerebral hematoma which has become isodense on CT scan as the lesion is virtually inapparent. There is moderate surrounding vasogenic edema which accompanies  extensive T2 and FLAIR hyperintensity throughout the remainder the white matter representing baseline chronic microvascular ischemic change.  The greater wing of the sphenoid on the LEFT is dehiscent. There is a gliotic nubbin of LEFT medial temporal lobe measuring 10 x 11 mm cross-section projecting into and expanded sphenoid sinus. Contained T2 hyperintense fluid as overall cross-sectional measurements considerably larger, measuring 33 x 25 mm cross-section. 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 Neurosurgical consultation is warranted.  Marked T2 hyperintensity accompanies the pons and midbrain likely extensive chronic microvascular ischemic change or. The no restricted diffusion is associated. Tiny focus of susceptibility in the RIGHT paramedian pons as seen on image 7 series 8 with slight heterogeneity of signal, could also represent a subacute bleed. Multiple micro bleeds are seen throughout both cerebral and both cerebellar hemispheres of a chronic nature.  No sinus air-fluid level. Negative orbits. Trace BILATERAL mastoid effusions. Carotid, basilar, and vertebral flow voids are maintained.  MRA HEAD FINDINGS  RIGHT ICA: Moderate dolichoectasia of the petrous segment. Minor irregularity of the cavernous segment. Supraclinoid ICA tandem stenoses estimated 50-75%. Normal appearing ICA terminus.  LEFT ICA: Minor irregularity of the petrous and cavernous segments without flow reducing lesion. No supraclinoid or ICA terminus abnormality.  Basilar artery:  Minor irregularity without flow-limiting stenosis.  Vertebral arteries: RIGHT vertebral dominant. Severe stenosis of the distal LEFT vertebral at the basilar junction, estimated 90%.  Anterior cerebral arteries: Moderate irregularity on the RIGHT. LEFT A1 ACA dominant. No distal anterior cerebral stenosis.  Middle cerebral arteries: No proximal M1 stenosis on the RIGHT or LEFT. Moderate irregularity at the M2 and M3  segments consistent with intracranial atherosclerotic disease.  Posterior cerebral arteries: Minor irregularity without flow reducing proximal lesion.  Cerebellar arteries: Moderately diseased LEFT AICA. LEFT PICA not visualized. Normal-appearing superior cerebellar arteries.  IMPRESSION: Suspect subacute LEFT external capsule intracerebral hematoma, 11 x 15 mm cross-section. 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 Contained CSF projects within the enlarged LEFT sphenoid sinus. Neurosurgical consultation is warranted.  Extensive chronic microvascular ischemic change throughout the cerebral hemispheres and brainstem.  Potentially flow reducing stenoses of the supraclinoid ICA, distal LEFT vertebral, and cerebellar vessels.   Electronically Signed   By: Rolla Flatten M.D.   On: 10/14/2014 19:58    Scheduled Meds: . allopurinol  300 mg Oral Daily  . docusate sodium  100 mg Oral BID  . feeding supplement (ENSURE COMPLETE)  237 mL Oral BID BM  . hydrALAZINE  100 mg Oral TID  . iron polysaccharides  300 mg Oral Daily  .  labetalol  300 mg Oral TID  . levothyroxine  50 mcg Oral QAC breakfast  . NIFEdipine  60 mg Oral BID  . pantoprazole  40 mg Oral Daily  . sevelamer carbonate  2,400 mg Oral TID WC  . sodium bicarbonate  650 mg Oral BID  . sodium chloride  3 mL Intravenous Q12H   Continuous Infusions:   Principal Problem:   Intracerebral bleed Active Problems:   Hypertensive emergency   Hypokalemia   End stage renal disease   Protein-calorie malnutrition, severe   Dysphagia    Time spent: 35 minutes. Greater than 50% of this time was spent in direct contact with the patient coordinating care.    Lelon Frohlich  [facility] Pager [phone]  If 7PM-7AM, please  contact night-coverage at [email or web address], password [facility] 10/15/2014, 5:52 PM  LOS: 1 day

## 2014-10-15 NOTE — Progress Notes (Signed)
A follow up was done regarding Neurosurgery consult,I spoke with Dr Jerilee Hoh who was going to look further into order.K+ 3.0,and Hbg 7.9,Hct 25.5,Dr Jerilee Hoh notified. Will continue to monitor patient.

## 2014-10-15 NOTE — Progress Notes (Addendum)
INITIAL NUTRITION ASSESSMENT Pt meets criteria for Severe MALNUTRITION in the context of Chronic illness as evidenced by loss of 21% bw in 5 months and eating <75% of her estimated needs for > 1 month. DOCUMENTATION CODES Per approved criteria  -Severe malnutrition in the context of chronic illness   INTERVENTION: -Ensure Enlive po BID, each supplement provides 350 kcal and 20 grams of protein  - Recommend Folic acid, Vit C, Vit D3 supps for renal patient   NUTRITION DIAGNOSIS: Inadequate oral intake related to severe nausea/vomiting as evidenced by loss of 38 lbs in 5 months.   Goal: Pt to meet >/= 90% of their estimated nutrition needs   Monitor:  Oral intake, weight, oral diet tolerance, work up, labs  Reason for Assessment: MST 3  64 y.o. female  Admitting Dx: <principal problem not specified>  ASSESSMENT: 64 year old female with past medical history of HTN Hypothyroidism; Uterine cancer, Anemia; CKD,, Dysphagia Hyperparathyroidism due to renal insufficiency, GERD and Diverticulitis. Patient was found to have elevated blood pressure in the doctor's office and they recommended admission  Patient states that she has intermittent episodes where she is unable to tolerate any oral intake. She says one day she can eat a certain food all right, but the next day it wont stay down.She does not notice any foods that offend more than others or see any pattern to when she experiences these episodes. She claims this has been going on since she first started having kidney troubles, however it has only started affecting her weight recently.   Pt was 180 pounds and has since fallen to 142 over the past 5 months due to her not being able to keep food down.   Patient said the she was unable to keep down the vanilla nepro and might be able to keep down Ensure choc/strawberry. Will order.    Nutrition Focused Physical Exam: Limited d/t patient wearing robe Subcutaneous Fat:  Orbital Region:  none Upper Arm Region: n/a Thoracic and Lumbar Region: n/a  Muscle:  Temple Region: mild Clavicle Bone Region: n/a Clavicle and Acromion Bone Region: moderate Scapular Bone Region: n/a Dorsal Hand: none Patellar Region: fluid overloaded Anterior Thigh Region: fluid overloaded Posterior Calf Region: fluid overloaded  Edema: non pitting both extremities   Height: Ht Readings from Last 1 Encounters:  10/14/14 5\' 8"  (1.727 m)    Weight: Wt Readings from Last 1 Encounters:  10/15/14 141 lb 5 oz (64.1 kg)    Ideal Body Weight: 140 lbs  % Ideal Body Weight: 101%  Wt Readings from Last 10 Encounters:  10/15/14 141 lb 5 oz (64.1 kg)  08/19/14 165 lb (74.844 kg)  05/01/14 166 lb (75.297 kg)  12/07/13 171 lb (77.565 kg)  12/03/13 180 lb (81.647 kg)  10/29/13 180 lb (81.647 kg)  10/19/13 180 lb 9.6 oz (81.92 kg)  09/04/13 183 lb (83.008 kg)  09/03/13 183 lb (83.008 kg)  08/31/13 186 lb 9.6 oz (84.641 kg)    Usual Body Weight: 180  % Usual Body Weight:79%  BMI:  Body mass index is 21.49 kg/(m^2).  Estimated Nutritional Needs: Kcal:1600-1800 (25-28 kcal/kg) Protein: 77-90 (1.2-1.4 g/kg) Fluid:1200 mls  Skin: n/a  Diet Order: Diet renal W/1239mL fluid restriction  EDUCATION NEEDS: -No education needs identified at this time   Intake/Output Summary (Last 24 hours) at 10/15/14 1543 Last data filed at 10/15/14 0010  Gross per 24 hour  Intake    240 ml  Output      0 ml  Net    240 ml    Last BM: 3/14    Labs:   Recent Labs Lab 10/14/14 1608 10/15/14 0253 10/15/14 0254  NA 137 138  --   K 3.0* 3.0*  --   CL 100 101  --   CO2 28 27  --   BUN 21 23  --   CREATININE 7.56* 8.01*  --   CALCIUM 9.5 8.8  --   MG  --   --  1.8  GLUCOSE 106* 95  --     CBG (last 3)  No results for input(s): GLUCAP in the last 72 hours.  Scheduled Meds: . allopurinol  300 mg Oral Daily  . docusate sodium  100 mg Oral BID  . feeding supplement (NEPRO CARB STEADY)   237 mL Oral BID BM  . hydrALAZINE  100 mg Oral TID  . iron polysaccharides  300 mg Oral Daily  . labetalol  300 mg Oral TID  . levothyroxine  50 mcg Oral QAC breakfast  . NIFEdipine  60 mg Oral BID  . pantoprazole  40 mg Oral Daily  . sevelamer carbonate  2,400 mg Oral TID WC  . sodium bicarbonate  650 mg Oral BID  . sodium chloride  3 mL Intravenous Q12H    Continuous Infusions:   Past Medical History  Diagnosis Date  . HTN (hypertension)   . Hypothyroidism   . Uterine cancer 2012  . Anemia   . CKD (chronic kidney disease)   . Dysphagia   . Hyperparathyroidism due to renal insufficiency   . GERD (gastroesophageal reflux disease)   . Diverticulitis     Past Surgical History  Procedure Laterality Date  . Cholecystectomy    . Cesarean section    . Laparoscopic total hysterectomy    . Colonoscopy  2000    TICS, IH  . Colonoscopy  2003 NUR BRBPR D50 V6    DC/[facility] TICS, [facility]  . Abdominal hysterectomy    . Nasal septum surgery    . Esophagogastroduodenoscopy (egd) with esophageal dilation N/A 06/08/2013    Dr. Fields:Stricture at the gastroesophageal junction/MILD NON-erosive gastritis (inflammation) was found in the gastric antrum; multiple biopsies/NO SOURCE FOR ANEMIA IDETIFIED-MOST LIKELY DUE TO ANEMIA OF CHRONIC DISEASE  . Colonoscopy N/A 09/04/2013    Procedure: COLONOSCOPY;  Surgeon: Danie Binder, MD;  [facility];  Service: Endoscopy;  Laterality: N/A;  10:30AM-moved to 9:25 [name]  . Appendectomy    . Bascilic vein transposition Right 09/17/2013    Procedure: BRACHIAL VEIN TRANSPOSITION;  Surgeon: Conrad Halifax, MD;  Location: [place];  Service: Vascular;  Laterality: Right;  . Insertion of dialysis catheter Left 09/17/2013    Procedure: INSERTION OF DIALYSIS CATHETER;  Surgeon: Conrad St. Leonard, MD;  Location: [place];  Service: Vascular;  Laterality: Left;  . Bascilic vein transposition Right 10/29/2013    Procedure: RIGHT SECOND STAGE BRACHIAL VEIN  TRANSPOSITION;  Surgeon: Conrad Ranson, MD;  Location[facility] OR;  Service: Vascular;  Laterality: Right;  . Givens capsule study N/A 12/03/2013    Procedure: GIVENS CAPSULE STUDY;  Surgeon: Danie Binder, MD;  [facility]: [facility];  Service: Endoscopy;  Laterality: N/A;  7:30    Burtis Junes RD, LDN Nutrition Pager: [phone] 10/15/2014 3:43 PM

## 2014-10-15 NOTE — Progress Notes (Signed)
UR chart review completed.  

## 2014-10-15 NOTE — Care Management Note (Addendum)
    Page 1 of 2   10/16/2014     10:02:21 AM CARE MANAGEMENT NOTE 10/16/2014  Patient:  Sonya [name], Sonya [name]   Account Number:  [id number]  Date Initiated:  10/15/2014  Documentation initiated by:  Theophilus Kinds  Subjective/Objective Assessment:   Pt admitted from home with HTN urgency and anemia. Pt lives with her husband and will return home at discharge. Pt is fairly independent with ADL's.     Action/Plan:   Pt is requesting rollator from [facility]. Order called to [facility] and will be delivered to pts room prior to discharge. Will continue to follow for discharge planning needs.   Anticipated DC Date:  10/17/2014   Anticipated DC Plan:  HOME/SELF CARE      DC Planning Services  CM consult      PAC Choice  DURABLE MEDICAL EQUIPMENT   Choice offered to / List presented to:  C-1 Patient   DME arranged  Vassie Moselle      DME agency  [place] arranged  [place].   Status of service:  Completed, signed off Medicare Important Message given?   (If response is "NO", the following Medicare IM given date fields will be blank) Date Medicare IM given:   Medicare IM given by:   Date Additional Medicare IM given:   Additional Medicare IM given by:    Discharge Disposition:  [place]  Per UR Regulation:    If discussed at Long Length of Stay Meetings, dates discussed:    Comments:  10/16/14 [place], RN BSN CM Pt discharged home today. HH services arranged with [facility]. Pt and pts nurse aware of discharge arrangements.  10/15/14 [place], RN BSN CM Pt [place] PT arranged with [facility] (per pts choice). Romualdo Bolk of [facility] is aware and will collect the pts information from the chart. [place] services to start within 48 hours of discharge. Rollator delivered to pt. Pt and pts nurse aware of discharge arrangements.  10/15/14 [place], RN BSN CM

## 2014-10-15 NOTE — Progress Notes (Signed)
Patient c/o difficulty swallowing at times, Dr Jerilee Hoh notified. Orders received,and given. Will continue to monitor patient.

## 2014-10-15 NOTE — Consult Note (Signed)
Sonya [name] MRN: [id number] DOB/AGE: July 07, 1951 64 y.o. Primary Care Physician:FUSCO,LAWRENCE J., MD Admit date: 10/14/2014 Chief Complaint:  Chief Complaint  Patient presents with  . Hypertension  . Dizziness   HPI: Pt is 64 year old female with past medical hx of ESRD who came to ER with c/o Hypertension.  HPI dates back to 1 week ago when pt went to see his pcp and was found to be hypertensive and sent to ER. Upon evaluation in er pt was found to have cerebral hematoma and was admitted for further care. Pt c/o having difficulty swallowing. Pt also gives hx of unintentional weight loss. NO c/o change in speech NO c/o chest pain NO c/o dyspnea No c/o fevr/cough/chills NO c/o nausea/vomiting. NO c/o abdominal pain.  Past Medical History  Diagnosis Date  . HTN (hypertension)   . Hypothyroidism   . Uterine cancer 2012  . Anemia   . CKD (chronic kidney disease)   . Dysphagia   . Hyperparathyroidism due to renal insufficiency   . GERD (gastroesophageal reflux disease)   . Diverticulitis        Family History  Problem Relation Age of Onset  . Colon cancer Neg Hx   . Cancer Mother   . Diabetes Mother   . Hypertension Mother   . Diabetes Father   . Hypertension Father   . Hypertension Sister     Social History:  reports that she has never smoked. She has never used smokeless tobacco. She reports that she does not drink alcohol or use illicit drugs.   Allergies:  Allergies  Allergen Reactions  . Cephalexin Itching    Medications Prior to Admission  Medication Sig Dispense Refill  . allopurinol (ZYLOPRIM) 300 MG tablet Take 300 mg by mouth daily.    Marland Kitchen aspirin EC 81 MG tablet Take 81 mg by mouth daily.    Marland Kitchen docusate sodium (COLACE) 100 MG capsule Take 100 mg by mouth 2 (two) times daily.    . ferrous sulfate dried (SLOW FE) 160 (50 FE) MG TBCR SR tablet Take 640 mg by mouth daily.    . hydrALAZINE (APRESOLINE) 100 MG tablet Take 100 mg by mouth 3 (three) times  daily.     Marland Kitchen labetalol (NORMODYNE) 300 MG tablet Take 1 tablet (300 mg total) by mouth 3 (three) times daily. 90 tablet 1  . levothyroxine (SYNTHROID, LEVOTHROID) 50 MCG tablet Take 1 tablet (50 mcg total) by mouth daily. 30 tablet 2  . lidocaine-prilocaine (EMLA) cream Apply 1 application topically as needed (on Dialysis days Tues,Thurs.,Sat.).    Marland Kitchen Linaclotide (LINZESS) 290 MCG CAPS capsule 1 PO 30 MINS PRIOR TO BREAKFAST. IF O SATISFACTORY BM AFTER 2 WEEKS, TAKE WITH BREAKFAST. (Patient taking differently: Take 290 mcg by mouth every morning. ) 30 capsule 11  . meclizine (ANTIVERT) 25 MG tablet Take 25 mg by mouth 3 (three) times daily as needed for dizziness.    Marland Kitchen NIFEdipine (PROCARDIA) 20 MG capsule Take 60 mg by mouth 2 (two) times daily.    Marland Kitchen omeprazole (PRILOSEC) 20 MG capsule Take 1 capsule (20 mg total) by mouth daily. 31 capsule 5  . ondansetron (ZOFRAN) 4 MG tablet Take 4 mg by mouth every 8 (eight) hours as needed for nausea or vomiting.    Marland Kitchen oxyCODONE-acetaminophen (PERCOCET/ROXICET) 5-325 MG per tablet Take 1 tablet by mouth every 6 (six) hours as needed (pain).     Marland Kitchen sevelamer carbonate (RENVELA) 800 MG tablet Take 800-2,400 mg by mouth 3 (  three) times daily with meals. Patient takes 1 tablet with snack and 3 tablets with meal    . sodium bicarbonate 650 MG tablet Take 1 tablet (650 mg total) by mouth 2 (two) times daily. 60 tablet 1  . NIFEdipine (PROCARDIA XL/ADALAT-CC) 90 MG 24 hr tablet Take 1 tablet (90 mg total) by mouth daily. (Patient not taking: Reported on 10/14/2014) 30 tablet 1       ZHY:QMVHQ from the symptoms mentioned above,there are no other symptoms referable to all systems reviewed.  Marland Kitchen allopurinol  300 mg Oral Daily  . docusate sodium  100 mg Oral BID  . feeding supplement (ENSURE COMPLETE)  237 mL Oral BID BM  . hydrALAZINE  100 mg Oral TID  . iron polysaccharides  300 mg Oral Daily  . labetalol  300 mg Oral TID  . levothyroxine  50 mcg Oral QAC breakfast   . NIFEdipine  60 mg Oral BID  . pantoprazole  40 mg Oral Daily  . sevelamer carbonate  2,400 mg Oral TID WC  . sodium bicarbonate  650 mg Oral BID  . sodium chloride  3 mL Intravenous Q12H      Physical Exam: Vital signs in last 24 hours: Temp:  [97.3 F (36.3 C)-98.4 F (36.9 C)] 97.3 F (36.3 C) (03/15 1619) Pulse Rate:  [70-88] 72 (03/15 1619) Resp:  [0-24] 20 (03/15 1619) BP: (134-205)/(56-99) 176/75 mmHg (03/15 1619) SpO2:  [90 %-100 %] 99 % (03/15 1619) Weight:  [141 lb 5 oz (64.1 kg)-141 lb 8.6 oz (64.2 kg)] 141 lb 5 oz (64.1 kg) (03/15 0448) Weight change:  Last BM Date: 10/14/14  Intake/Output from previous day: 03/14 0701 - 03/15 0700 In: 240 [P.O.:240] Out: -      Physical Exam: General- pt is awake,alert, oriented to time place and person Resp- No acute REsp distress, CTA B/L NO Rhonchi CVS- S1S2 regular in rate and rhythm GIT- BS+, soft, NT, ND EXT- NO LE Edema, Cyanosis CNS- CN 2-12 grossly intact. Moving all 4 extremities Psych- normal mod and affect Access- AVF+   Lab Results: CBC  Recent Labs  10/14/14 1608 10/15/14 0253  WBC 3.9* 4.0  HGB 8.6* 7.9*  HCT 29.1* 25.5*  PLT 191 176    BMET  Recent Labs  10/14/14 1608 10/15/14 0253  NA 137 138  K 3.0* 3.0*  CL 100 101  CO2 28 27  GLUCOSE 106* 95  BUN 21 23  CREATININE 7.56* 8.01*  CALCIUM 9.5 8.8    MICRO No results found for this or any previous visit (from the past 240 hour(s)).    Lab Results  Component Value Date   PTH 473.2* 06/09/2013   CALCIUM 8.8 10/15/2014   PHOS 4.7* 08/19/2014      Impression: 1)Renal  ESRD on Hd .               Pt on TTS schedule as outpt               Will dialyze today  2)HTN  BP better Medication- On Calcium Channel Blockers On Alpha and beta Blockers On Vasodilators. On [employer].  3)Anemia HGb at goal (9--11)  4)CKD Mineral-Bone Disorder PTH elevated  Secondary Hyperparathyroidism  present Phosphorus  at goal.   5)CNS- hx of cerebral bleed Primary MD following  6)Electrolytes  Hypokalemic   repleted NOrmonatremic   7)Acid base Co2 at goal  8) GI - c/o difficulty swallowing      Primary team  planing to ask for GI      Pt known to Dr Darrick Penna.   Plan:  Will dialyze today Will use 4 k bath Will not use any heparin as admitted with   Cerebral bleed. Agree with plan to ask for GI eval.     Zayin Valadez S 10/15/2014, 5:35 PM

## 2014-10-15 NOTE — Evaluation (Signed)
Physical Therapy Evaluation Patient Details Name: Sonya [name] MRN: [id number] DOB: 20-Jul-1951 Today's Date: 10/15/2014   History of Present Illness  64 year old female who  has a past medical history of HTN (hypertension); Hypothyroidism; Uterine cancer (2012); Anemia; CKD (chronic kidney disease); Dysphagia; Hyperparathyroidism due to renal insufficiency; GERD (gastroesophageal reflux disease); and Diverticulitis. patient is on hemodialysis for end-stage renal disease on Tuesday Thursday and Saturday. Her last urinalysis was and Saturday, patient says that today in the morning she went to see her primary care doctor because she has been feeling dizzy and her blood pressure has been elevated, patient was found to have elevated blood pressure in the doctor's office and they recommended admission. Patient was observed there for 2 hours and then decided to go home at home patient felt worse was having dizziness headache and generalized malaise also patient says that she has fallen several times over the past week. In the ED CT head was done which showed anterior left temporal lobe encephalocele, so MRI brain was done which showed subacute left external capsule intracerebral hematoma 1115 mm cross section. 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  Pt lives with her husband who is employed weekdays.  She has been independent with ADLs in the home PTA but is very concerned about falling.  Clinical Impression  Pt was seen for evaluation.  She was alert and oriented, cooperative.  She states that she has lost about 40# since last fall and feels generally weak.  On MMT she clearly has weakness (3/5).  Standing balance is WNL at bedside but pt is very fearful of falling and would like a walker for gait security.  She was instructed in use of rolling walker.  She was instructed to begin sit to stand and long arc quad exercise for quadriceps  strengthening.  We will recommend HHPT for generalized strengthening.    Follow Up Recommendations Home health PT    Equipment Recommendations  Rolling walker with 5" wheels (rollator)    Recommendations for Other Services   none    Precautions / Restrictions Precautions Precautions: Fall Restrictions Weight Bearing Restrictions: No      Mobility  Bed Mobility Overal bed mobility: Modified Independent                Transfers Overall transfer level: Modified independent                  Ambulation/Gait Ambulation/Gait assistance: Supervision Ambulation Distance (Feet): 150 Feet Assistive device: Rolling walker (2 wheeled) Gait Pattern/deviations: Shuffle;Trunk flexed   Gait velocity interpretation: Below normal speed for age/gender    Stairs Stairs: Yes Stairs assistance: Min guard Stair Management: One rail Left;Alternating pattern;Forwards Number of Stairs: 10 General stair comments: pt holds onto one rail with 2 hands...very labored task for pt  Wheelchair Mobility    Modified Rankin (Stroke Patients Only)       Balance Overall balance assessment: Needs assistance Sitting-balance support: No upper extremity supported;Feet supported Sitting balance-Leahy Scale: Normal     Standing balance support: No upper extremity supported Standing balance-Leahy Scale: Good Standing balance comment: pt states that when she falls it is because her legs give way from under her or she gets dizzy                             Pertinent Vitals/Pain Pain Assessment: No/denies pain    Home  Living Family/patient expects to be discharged to:: Private residence Living Arrangements: Spouse/significant other Available Help at Discharge: Family;Available PRN/intermittently Type of Home: House Home Access: Stairs to enter Entrance Stairs-Rails: Right Entrance Stairs-Number of Steps: 2 Home Layout: One level Home Equipment: Bedside commode       Prior Function Level of Independence: Independent               Hand Dominance        Extremity/Trunk Assessment   Upper Extremity Assessment: Generalized weakness           Lower Extremity Assessment: Generalized weakness      Cervical / Trunk Assessment: Kyphotic  Communication   Communication: No difficulties  Cognition Arousal/Alertness: Awake/alert Behavior During Therapy: WFL for tasks assessed/performed Overall Cognitive Status: Within Functional Limits for tasks assessed                      General Comments      Exercises        Assessment/Plan    PT Assessment All further PT needs can be met in the next venue of care  PT Diagnosis Difficulty walking;Generalized weakness   PT Problem List Decreased activity tolerance;Decreased strength;Decreased mobility  PT Treatment Interventions     PT Goals (Current goals can be found in the Care Plan section) Acute Rehab PT Goals PT Goal Formulation: All assessment and education complete, DC therapy    Frequency     Barriers to discharge  none      Co-evaluation               End of Session Equipment Utilized During Treatment: Gait belt Activity Tolerance: Patient tolerated treatment well Patient left: in bed;with call bell/phone within reach Nurse Communication: Mobility status         Time: 4128-7867 PT Time Calculation (min) (ACUTE ONLY): 29 min   Charges:   PT Evaluation $Initial PT Evaluation Tier I: 1 Procedure     PT G CodesDemetrios Isaacs L 10/15/2014, 4:06 PM

## 2014-10-16 DIAGNOSIS — N189 Chronic kidney disease, unspecified: Secondary | ICD-10-CM

## 2014-10-16 DIAGNOSIS — D631 Anemia in chronic kidney disease: Secondary | ICD-10-CM

## 2014-10-16 DIAGNOSIS — I619 Nontraumatic intracerebral hemorrhage, unspecified: Secondary | ICD-10-CM

## 2014-10-16 DIAGNOSIS — R1319 Other dysphagia: Secondary | ICD-10-CM

## 2014-10-16 DIAGNOSIS — I610 Nontraumatic intracerebral hemorrhage in hemisphere, subcortical: Secondary | ICD-10-CM

## 2014-10-16 MED ORDER — SODIUM CHLORIDE 0.9 % IV SOLN: 100.0000 mL | INTRAVENOUS | Status: DC | PRN

## 2014-10-16 MED ORDER — LIDOCAINE HCL (PF) 1 % IJ SOLN: 5.0000 mL | INTRAMUSCULAR | Status: DC | PRN

## 2014-10-16 MED ORDER — NEPRO/CARBSTEADY PO LIQD: 237.0000 mL | ORAL | Status: DC | PRN

## 2014-10-16 MED ORDER — ALTEPLASE 2 MG IJ SOLR
2.0000 mg | Freq: Once | INTRAMUSCULAR | Status: DC | PRN
  Filled 2014-10-16: qty 2

## 2014-10-16 MED ORDER — HEPARIN SODIUM (PORCINE) 1000 UNIT/ML DIALYSIS
1000.0000 [IU] | INTRAMUSCULAR | Status: DC | PRN
  Filled 2014-10-16: qty 1

## 2014-10-16 MED ORDER — LIDOCAINE-PRILOCAINE 2.5-2.5 % EX CREA: 1.0000 "application " | TOPICAL_CREAM | CUTANEOUS | Status: DC | PRN

## 2014-10-16 MED ORDER — PENTAFLUOROPROP-TETRAFLUOROETH EX AERO: 1.0000 "application " | INHALATION_SPRAY | CUTANEOUS | Status: DC | PRN

## 2014-10-16 MED ORDER — PENTAFLUOROPROP-TETRAFLUOROETH EX AERO
INHALATION_SPRAY | CUTANEOUS | Status: AC
  Filled 2014-10-16: qty 103.5

## 2014-10-16 NOTE — Discharge Summary (Signed)
Physician Discharge Summary  Sonya [name] [id number] DOB: 1951/02/03 DOA: 10/14/2014  PCP: [name], MD  Admit date: 10/14/2014 Discharge date: 10/16/2014  Time spent: 45 minutes  Recommendations for Outpatient Follow-up:  -Will be discharged home today. -Will follow up with PCP in 2 weeks. -F/u with Dr. Arnoldo Morale in 1 week. -Will f/u with Dr. Oneida Alar in 4 weeks for an OP EGD with esophageal dilatation.   Discharge Diagnoses:  Principal Problem:   Intracerebral bleed Active Problems:   Hypertensive emergency   Hypokalemia   End stage renal disease   Protein-calorie malnutrition, severe   Dysphagia   Discharge Condition: Stable and improved  Filed Weights   10/14/14 2231 10/15/14 0448 10/16/14 1245  Weight: 64.2 kg (141 lb 8.6 oz) 64.1 kg (141 lb 5 oz) 64.1 kg (141 lb 5 oz)    History of present illness:  64 year old female who  has a past medical history of HTN (hypertension); Hypothyroidism; Uterine cancer (2012); Anemia; CKD (chronic kidney disease); Dysphagia; Hyperparathyroidism due to renal insufficiency; GERD (gastroesophageal reflux disease); and Diverticulitis. patient is on hemodialysis for end-stage renal disease on Tuesday Thursday and Saturday. Her last urinalysis was and Saturday, patient says that today in the morning she went to see her primary care doctor because she has been feeling dizzy and her blood pressure has been elevated, patient was found to have elevated blood pressure in the doctor's office and they recommended admission. Patient was observed there for 2 hours and then decided to go home at home patient felt worse was having dizziness headache and generalized malaise also patient says that she has fallen several times over the past week. In the ED CT head was done which showed anterior left temporal lobe encephalocele, so MRI brain was done which showed subacute left external capsule intracerebral hematoma 1115 mm cross section.  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 Neurosurgery was consulted by the ED physician, who recommended no operative management. Patient denies slurred speech, no blurred vision no weakness of extremities. But she has been falling at home and has been dizzy for the past 3 weeks. Patient's blood pressure has not been well controlled as per patient. She did not take her medications for blood pressure this morning. She denies chest pain, intermittent shortness of breath, no nausea vomiting or diarrhea.  Hospital Course:   Intracerebral Hematoma -Stable, per repeat CT scan. -Dr. Arnoldo Morale consulted on admission opined no surgical intervention required. -Follow up with neurosurgery as an OP in 1 week. -MRI also with findings of encephalocele.  Dizziness -Suspect related to above. -Seen by PT with recommendations for HHPT.  HTN -BP improved today. -Should improve even further with HD.  ESRD -On HD TTS. -HD yesterday not done due to emergent case. -Will go for HD today. -Appreciate renal input.  Hypokalemia -To correct during HD.  Dysphagia and Weight Loss -Gi to perfrom EGD with dilatation as an OP in 4 weeks.   Procedures:  None   Consultations:  GI  Discharge Instructions  Discharge Instructions    Diet - low sodium heart healthy    Complete by:  As directed      Increase activity slowly    Complete by:  As directed             Medication List    STOP taking these medications        aspirin EC 81 MG tablet  TAKE these medications        allopurinol 300 MG tablet  Commonly known as:  ZYLOPRIM  Take 300 mg by mouth daily.     docusate sodium 100 MG capsule  Commonly known as:  COLACE  Take 100 mg by mouth 2 (two) times daily.     ferrous sulfate dried 160 (50 FE) MG Tbcr SR tablet  Commonly known as:  SLOW FE  Take 640 mg by mouth daily.     hydrALAZINE 100 MG tablet    Commonly known as:  APRESOLINE  Take 100 mg by mouth 3 (three) times daily.     labetalol 300 MG tablet  Commonly known as:  NORMODYNE  Take 1 tablet (300 mg total) by mouth 3 (three) times daily.     levothyroxine 50 MCG tablet  Commonly known as:  SYNTHROID, LEVOTHROID  Take 1 tablet (50 mcg total) by mouth daily.     lidocaine-prilocaine cream  Commonly known as:  EMLA  Apply 1 application topically as needed (on Dialysis days Tues,Thurs.,Sat.).     Linaclotide 290 MCG Caps capsule  Commonly known as:  LINZESS  1 PO 30 MINS PRIOR TO BREAKFAST. IF O SATISFACTORY BM AFTER 2 WEEKS, TAKE WITH BREAKFAST.     meclizine 25 MG tablet  Commonly known as:  ANTIVERT  Take 25 mg by mouth 3 (three) times daily as needed for dizziness.     NIFEdipine 20 MG capsule  Commonly known as:  PROCARDIA  Take 60 mg by mouth 2 (two) times daily.     omeprazole 20 MG capsule  Commonly known as:  PRILOSEC  Take 1 capsule (20 mg total) by mouth daily.     ondansetron 4 MG tablet  Commonly known as:  ZOFRAN  Take 4 mg by mouth every 8 (eight) hours as needed for nausea or vomiting.     oxyCODONE-acetaminophen 5-325 MG per tablet  Commonly known as:  PERCOCET/ROXICET  Take 1 tablet by mouth every 6 (six) hours as needed (pain).     sevelamer carbonate 800 MG tablet  Commonly known as:  RENVELA  Take 800-2,400 mg by mouth 3 (three) times daily with meals. Patient takes 1 tablet with snack and 3 tablets with meal     sodium bicarbonate 650 MG tablet  Take 1 tablet (650 mg total) by mouth 2 (two) times daily.       Allergies  Allergen Reactions  . Cephalexin Itching       Follow-up Information    Follow up with [facility].   Contact information:   [address] [place] [address] [phone]       Follow up with Ophelia Charter, MD. Schedule an appointment as soon as possible for a visit in 1 week.   Specialty:  Neurosurgery   Contact information:    [address] [place] [address] [place] [address] [phone]       Follow up with [name], MD On 10/24/2014.   Specialty:  Internal Medicine   Why:  at 12:30 pm   Contact information:   [address] [place] [id number] [phone]        The results of significant diagnostics from this hospitalization (including imaging, microbiology, ancillary and laboratory) are listed below for reference.    Significant Diagnostic Studies: Dg Chest 2 View  10/14/2014   CLINICAL DATA:  Hypertension and dizziness  EXAM: CHEST  2 VIEW  COMPARISON:  09/17/2013  FINDINGS: There  is chronic moderate cardiomegaly and aortic tortuosity.  Trace left pleural effusion which is new. No edema or suspected pneumonia. No pneumothorax. No acute osseous findings.  IMPRESSION: 1. Trace left pleural effusion. 2. Stable cardiomegaly.   Electronically Signed   By: Monte Fantasia M.D.   On: 10/14/2014 16:24   Ct Head Wo Contrast  10/15/2014   CLINICAL DATA:  Abnormal MRI with dizziness and weakness for 1 month. Intracranial bleed.  EXAM: CT HEAD WITHOUT CONTRAST  TECHNIQUE: Contiguous axial images were obtained from the base of the skull through the vertex without intravenous contrast.  COMPARISON:  10/14/2014  FINDINGS: Skull and Sinuses:No acute findings.  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 Herniated temporal lobe is better seen on recent MRI imaging.  Orbits: No acute abnormality.  Brain: An isodense to hypodense hemorrhage in or below the posterior left putamen shows no visible enlargement. There is extensive chronic small vessel disease with confluent ischemic gliosis throughout the bilateral cerebral white matter and remote bilateral thalamic infarcts. Marked disease of the pons is better seen on previous MRI. No evidence of acute infarct, hydrocephalus, or shift.  IMPRESSION:  1. Stable appearance of subacute and isodense left basal ganglia hematoma. No acute hemorrhage. 2. Advanced chronic small vessel disease. 3. 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   Electronically Signed   By: Monte Fantasia M.D.   On: 10/15/2014 12:46   Ct Head Wo Contrast  10/14/2014   CLINICAL DATA:  Frequent falls for 1 month, elevated blood pressure and dizziness today, history hypertension, chronic kidney disease  EXAM: CT HEAD WITHOUT CONTRAST  TECHNIQUE: Contiguous axial images were obtained from the base of the skull through the vertex without intravenous contrast.  COMPARISON:  12/02/2008  FINDINGS: Normal ventricular morphology.  No midline shift or mass effect.  Extensive small vessel chronic ischemic changes of deep cerebral white matter.  No intracranial hemorrhage or evidence acute infarction.  No extra-axial fluid collection.  Mastoid air cells and middle ear cavities appear clear.  Fluid attenuation mass at identified at LEFT sphenoid sinus increased in size since previous exam now 2.4 x 3.4 cm.  This contains a 10 mm diameter soft tissue component laterally.  This appears to be associated with a defect in the medial wall of the LEFT middle cranial fossa.  Potentially this could represent a small encephalocele with meninges and CSF protruding into the sphenoid sinus rather than a mucosal retention cyst.  Atherosclerotic calcifications of the carotid siphons.  Calvaria intact.  IMPRESSION: Extensive small vessel chronic ischemic changes of deep cerebral white matter.  Progressive LEFT sphenoid sinus opacification containing fluid components as well as a soft tissue nodular component laterally ; potentially this may represent herniation of meninges and a portion of the anterior LEFT temporal lobe/encephalocele through a defect in the wall the middle cranial fossa into the sphenoid sinus rather than a complicated mucosal retention cyst/mucocele  of the sphenoid sinus.  MR imaging with and without contrast recommended for further assessment.  Findings called to Dr. Stark Jock on 10/14/2014 at 0530 hours.   Electronically Signed   By: Lavonia Dana M.D.   On: 10/14/2014 17:31   Mr [name] Nam Head Wo Contrast  10/14/2014   CLINICAL DATA:  Weakness and dizziness for 1 month. History of renal failure. Initial encounter.  EXAM: MRI HEAD WITHOUT CONTRAST  MRA HEAD WITHOUT CONTRAST  TECHNIQUE: Multiplanar, multiecho pulse sequences of the brain and surrounding structures were obtained without intravenous contrast. Angiographic images of the head were obtained using MRA technique without contrast.  COMPARISON:  CT head earlier today.  FINDINGS: MRI HEAD FINDINGS  There is a moderate sized area of restricted diffusion in the external capsule on the LEFT, slightly involving the lentiform nucleus and adjacent to the posterior limb internal capsule. Cross-sectional measurements are 11 x 15 mm. This lesion is hyperintense on T1, T2, and FLAIR imaging, with isointense central component on gradient sequence and peripheral T2 shortening. I believe this represents a subacute intracerebral hematoma which has become isodense on CT scan as the lesion is virtually inapparent. There is moderate surrounding vasogenic edema which accompanies extensive T2 and FLAIR hyperintensity throughout the remainder the white matter representing baseline chronic microvascular ischemic change.  The greater wing of the sphenoid on the LEFT is dehiscent. There is a gliotic nubbin of LEFT medial temporal lobe measuring 10 x 11 mm cross-section projecting into and expanded sphenoid sinus. Contained T2 hyperintense fluid as overall cross-sectional measurements considerably larger, measuring 33 x 25 mm cross-section. 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 Neurosurgical consultation is warranted.  Marked T2 hyperintensity accompanies the pons and midbrain likely extensive  chronic microvascular ischemic change or. The no restricted diffusion is associated. Tiny focus of susceptibility in the RIGHT paramedian pons as seen on image 7 series 8 with slight heterogeneity of signal, could also represent a subacute bleed. Multiple micro bleeds are seen throughout both cerebral and both cerebellar hemispheres of a chronic nature.  No sinus air-fluid level. Negative orbits. Trace BILATERAL mastoid effusions. Carotid, basilar, and vertebral flow voids are maintained.  MRA HEAD FINDINGS  RIGHT ICA: Moderate dolichoectasia of the petrous segment. Minor irregularity of the cavernous segment. Supraclinoid ICA tandem stenoses estimated 50-75%. Normal appearing ICA terminus.  LEFT ICA: Minor irregularity of the petrous and cavernous segments without flow reducing lesion. No supraclinoid or ICA terminus abnormality.  Basilar artery:  Minor irregularity without flow-limiting stenosis.  Vertebral arteries: RIGHT vertebral dominant. Severe stenosis of the distal LEFT vertebral at the basilar junction, estimated 90%.  Anterior cerebral arteries: Moderate irregularity on the RIGHT. LEFT A1 ACA dominant. No distal anterior cerebral stenosis.  Middle cerebral arteries: No proximal M1 stenosis on the RIGHT or LEFT. Moderate irregularity at the M2 and M3 segments consistent with intracranial atherosclerotic disease.  Posterior cerebral arteries: Minor irregularity without flow reducing proximal lesion.  Cerebellar arteries: Moderately diseased LEFT AICA. LEFT PICA not visualized. Normal-appearing superior cerebellar arteries.  IMPRESSION: Suspect subacute LEFT external capsule intracerebral hematoma, 11 x 15 mm cross-section. 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 Contained CSF projects within the  enlarged LEFT sphenoid sinus. Neurosurgical consultation is warranted.  Extensive chronic microvascular ischemic change throughout the cerebral hemispheres and brainstem.  Potentially flow reducing stenoses of the supraclinoid ICA, distal LEFT vertebral, and cerebellar vessels.   Electronically Signed   By: Rolla Flatten M.D.   On: 10/14/2014 19:58   Mr Brain Wo Contrast  10/14/2014   CLINICAL DATA:  Weakness and dizziness for 1 month. History of renal failure. Initial encounter.  EXAM: MRI HEAD WITHOUT CONTRAST  MRA HEAD WITHOUT CONTRAST  TECHNIQUE: Multiplanar, multiecho pulse sequences of the brain and surrounding structures were obtained without intravenous contrast.  Angiographic images of the head were obtained using MRA technique without contrast.  COMPARISON:  CT head earlier today.  FINDINGS: MRI HEAD FINDINGS  There is a moderate sized area of restricted diffusion in the external capsule on the LEFT, slightly involving the lentiform nucleus and adjacent to the posterior limb internal capsule. Cross-sectional measurements are 11 x 15 mm. This lesion is hyperintense on T1, T2, and FLAIR imaging, with isointense central component on gradient sequence and peripheral T2 shortening. I believe this represents a subacute intracerebral hematoma which has become isodense on CT scan as the lesion is virtually inapparent. There is moderate surrounding vasogenic edema which accompanies extensive T2 and FLAIR hyperintensity throughout the remainder the white matter representing baseline chronic microvascular ischemic change.  The greater wing of the sphenoid on the LEFT is dehiscent. There is a gliotic nubbin of LEFT medial temporal lobe measuring 10 x 11 mm cross-section projecting into and expanded sphenoid sinus. Contained T2 hyperintense fluid as overall cross-sectional measurements considerably larger, measuring 33 x 25 mm cross-section. Findings are consistent with medially projecting meningocele and  encephalocele. Neurosurgical consultation is warranted.  Marked T2 hyperintensity accompanies the pons and midbrain likely extensive chronic microvascular ischemic change or. The no restricted diffusion is associated. Tiny focus of susceptibility in the RIGHT paramedian pons as seen on image 7 series 8 with slight heterogeneity of signal, could also represent a subacute bleed. Multiple micro bleeds are seen throughout both cerebral and both cerebellar hemispheres of a chronic nature.  No sinus air-fluid level. Negative orbits. Trace BILATERAL mastoid effusions. Carotid, basilar, and vertebral flow voids are maintained.  MRA HEAD FINDINGS  RIGHT ICA: Moderate dolichoectasia of the petrous segment. Minor irregularity of the cavernous segment. Supraclinoid ICA tandem stenoses estimated 50-75%. Normal appearing ICA terminus.  LEFT ICA: Minor irregularity of the petrous and cavernous segments without flow reducing lesion. No supraclinoid or ICA terminus abnormality.  Basilar artery:  Minor irregularity without flow-limiting stenosis.  Vertebral arteries: RIGHT vertebral dominant. Severe stenosis of the distal LEFT vertebral at the basilar junction, estimated 90%.  Anterior cerebral arteries: Moderate irregularity on the RIGHT. LEFT A1 ACA dominant. No distal anterior cerebral stenosis.  Middle cerebral arteries: No proximal M1 stenosis on the RIGHT or LEFT. Moderate irregularity at the M2 and M3 segments consistent with intracranial atherosclerotic disease.  Posterior cerebral arteries: Minor irregularity without flow reducing proximal lesion.  Cerebellar arteries: Moderately diseased LEFT AICA. LEFT PICA not visualized. Normal-appearing superior cerebellar arteries.  IMPRESSION: Suspect subacute LEFT external capsule intracerebral hematoma, 11 x 15 mm cross-section. 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  Moderately  large medially projecting 33 x 25 mm encephalocele and meningocele from the LEFT middle cranial fossa. Contained CSF projects within the enlarged LEFT sphenoid sinus. Neurosurgical consultation is warranted.  Extensive chronic microvascular ischemic change throughout the cerebral hemispheres and brainstem.  Potentially flow reducing stenoses of the supraclinoid ICA, distal LEFT vertebral, and cerebellar vessels.   Electronically Signed   By: Rolla Flatten M.D.   On: 10/14/2014 19:58    Microbiology: No results found for this or any previous visit (from the past 240 hour(s)).   Labs: Basic Metabolic Panel:  Recent Labs Lab 10/14/14 1608 10/15/14 0253 10/15/14 0254  NA 137 138  --   K 3.0* 3.0*  --   CL 100 101  --   CO2 28 27  --   GLUCOSE 106* 95  --  BUN 21 23  --   CREATININE 7.56* 8.01*  --   CALCIUM 9.5 8.8  --   MG  --   --  1.8   Liver Function Tests:  Recent Labs Lab 10/14/14 1608 10/15/14 0253  AST 16 12  ALT 9 7  ALKPHOS 30* 28*  BILITOT 0.5 0.4  PROT 6.9 5.8*  ALBUMIN 3.5 2.9*   No results for input(s): LIPASE, AMYLASE in the last 168 hours. No results for input(s): AMMONIA in the last 168 hours. CBC:  Recent Labs Lab 10/14/14 1608 10/15/14 0253  WBC 3.9* 4.0  NEUTROABS 2.0  --   HGB 8.6* 7.9*  HCT 29.1* 25.5*  MCV 76.0* 74.8*  PLT 191 176   Cardiac Enzymes: No results for input(s): CKTOTAL, CKMB, CKMBINDEX, TROPONINI in the last 168 hours. BNP: BNP (last 3 results)  Recent Labs  10/14/14 1608  BNP 1512.0*    ProBNP (last 3 results) No results for input(s): PROBNP in the last 8760 hours.  CBG: No results for input(s): GLUCAP in the last 168 hours.     SignedLelon Frohlich  [facility] Pager: [phone] 10/16/2014, 3:34 PM

## 2014-10-16 NOTE — Consult Note (Signed)
Referring Provider: No ref. provider found Primary Care Physician:  [name], MD Primary Gastroenterologist:  Vertis Kelch, MD  Reason for Consultation:    Solid food dysphagia.  HPI:   Patient is 64 year old African-American female with multiple medical problems who woke up with dizziness and headache 2 days ago. She was noted to have elevated blood pressure by PCP and was advised evaluation in emergency room. 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 Phone consultation was obtained with neurosurgeon and supportive therapy was recommended it was felt this  would gradually resolve. She is feeling better as far headache and dizziness is concerned. Patient has been complaining of solid food dysphagia hence reason for GI consultation. She has chronic GERD and history of stricture at GE junction. This stricture was dilated by Dr. Oneida Alar in November 2014 to 16 mm. She states dilation helped her for several months and dysphagia started few months ago. She points to suprasternal area site of bolus obstruction. She has to symptom every 2-3 days and she gets relief with regurgitation. She says heartburns well controlled with omeprazole. She denies chest pain. She says her appetite is fair. She states she has lost 38 pounds since she went on hemodialysis. She has chronic constipation responsive to Linzess. She denies abdominal pain melena or rectal bleeding. She has history of GI bleed and need for transfusion and also underwent colonoscopy and small bowel given capsule study by Dr. Oneida Alar last year. She has not required transfusion recently.   Past Medical History  Diagnosis Date  . HTN (hypertension)   . Hypothyroidism   . Uterine cancer 2012  . Anemia   . CKD (chronic kidney disease)   . Dysphagia   . Hyperparathyroidism due to renal insufficiency   . GERD  (gastroesophageal reflux disease)   . Diverticulitis     Past Surgical History  Procedure Laterality Date  . Cholecystectomy    . Cesarean section    . Laparoscopic total hysterectomy    . Colonoscopy  2000    [facility], [facility]  . Colonoscopy  2003 NUR BRBPR D50 V6    [facility]/[place] [facility], [facility]  . Abdominal hysterectomy    . Nasal septum surgery    . Esophagogastroduodenoscopy (egd) with esophageal dilation N/A 06/08/2013    Dr. Fields:Stricture at the gastroesophageal junction/MILD NON-erosive gastritis (inflammation) was found in the gastric antrum; multiple biopsies/NO SOURCE FOR ANEMIA IDETIFIED-MOST LIKELY DUE TO ANEMIA OF CHRONIC DISEASE  . Colonoscopy N/A 09/04/2013    Procedure: COLONOSCOPY;  Surgeon: Danie Binder, MD;  [facility];  Service: Endoscopy;  Laterality: N/A;  10:30AM-moved to 9:25 [name]  . Appendectomy    . Bascilic vein transposition Right 09/17/2013    Procedure: BRACHIAL VEIN TRANSPOSITION;  Surgeon: Conrad Dickens, MD;  Location: [place];  Service: Vascular;  Laterality: Right;  . Insertion of dialysis catheter Left 09/17/2013    Procedure: INSERTION OF DIALYSIS CATHETER;  Surgeon: Conrad Interlaken, MD;  Location: [place];  Service: Vascular;  Laterality: Left;  . Bascilic vein transposition Right 10/29/2013    Procedure: RIGHT SECOND STAGE BRACHIAL VEIN TRANSPOSITION;  Surgeon: Conrad , MD;  Location: [facility] OR;  Service: Vascular;  Laterality: Right;  . Givens capsule study N/A 12/03/2013    Procedure: GIVENS CAPSULE STUDY;  Surgeon: Danie Binder, MD;  Location: [facility];  Service: Endoscopy;  Laterality: N/A;  7:30    Prior to Admission medications   Medication Sig Start Date End Date Taking? Authorizing Provider  allopurinol (ZYLOPRIM) 300 MG tablet Take 300 mg by mouth daily.   Yes Historical Provider, MD  aspirin EC 81 MG tablet Take 81 mg by mouth daily.   Yes Historical Provider, MD  docusate sodium (COLACE) 100 MG capsule Take 100 mg by mouth 2  (two) times daily.   Yes Historical Provider, MD  ferrous sulfate dried (SLOW FE) 160 (50 FE) MG TBCR SR tablet Take 640 mg by mouth daily.   Yes Historical Provider, MD  hydrALAZINE (APRESOLINE) 100 MG tablet Take 100 mg by mouth 3 (three) times daily.  09/30/13  Yes Historical Provider, MD  labetalol (NORMODYNE) 300 MG tablet Take 1 tablet (300 mg total) by mouth 3 (three) times daily. 08/20/13  Yes Kinnie Feil, MD  levothyroxine (SYNTHROID, LEVOTHROID) 50 MCG tablet Take 1 tablet (50 mcg total) by mouth daily. 08/20/13  Yes Kinnie Feil, MD  lidocaine-prilocaine (EMLA) cream Apply 1 application topically as needed (on Dialysis days Tues,Thurs.,Sat.).   Yes Historical Provider, MD  Linaclotide (LINZESS) 290 MCG CAPS capsule 1 PO 30 MINS PRIOR TO BREAKFAST. IF O SATISFACTORY BM AFTER 2 WEEKS, TAKE WITH BREAKFAST. Patient taking differently: Take 290 mcg by mouth every morning.  05/01/14  Yes Danie Binder, MD  meclizine (ANTIVERT) 25 MG tablet Take 25 mg by mouth 3 (three) times daily as needed for dizziness.   Yes Historical Provider, MD  NIFEdipine (PROCARDIA) 20 MG capsule Take 60 mg by mouth 2 (two) times daily. 09/19/14  Yes Historical Provider, MD  omeprazole (PRILOSEC) 20 MG capsule Take 1 capsule (20 mg total) by mouth daily. 07/29/14  Yes Orvil Feil, NP  ondansetron (ZOFRAN) 4 MG tablet Take 4 mg by mouth every 8 (eight) hours as needed for nausea or vomiting.   Yes Historical Provider, MD  oxyCODONE-acetaminophen (PERCOCET/ROXICET) 5-325 MG per tablet Take 1 tablet by mouth every 6 (six) hours as needed (pain).  09/17/13  Yes Historical Provider, MD  sevelamer carbonate (RENVELA) 800 MG tablet Take 800-2,400 mg by mouth 3 (three) times daily with meals. Patient takes 1 tablet with snack and 3 tablets with meal   Yes Historical Provider, MD  sodium bicarbonate 650 MG tablet Take 1 tablet (650 mg total) by mouth 2 (two) times daily. 06/12/13  Yes Kathie Dike, MD  NIFEdipine (PROCARDIA  XL/ADALAT-CC) 90 MG 24 hr tablet Take 1 tablet (90 mg total) by mouth daily. Patient not taking: Reported on 10/14/2014 08/20/13   Kinnie Feil, MD    Current Facility-Administered Medications  Medication Dose Route Frequency Provider Last Rate Last Dose  . 0.9 %  sodium chloride infusion  250 mL Intravenous PRN Oswald Hillock, MD      . 0.9 %  sodium chloride infusion  100 mL Intravenous PRN Manpreet Toya Smothers, MD      . 0.9 %  sodium chloride infusion  100 mL Intravenous PRN Manpreet Toya Smothers, MD      . acetaminophen (TYLENOL) tablet 650 mg  650 mg Oral Q6H PRN Oswald Hillock, MD       Or  . acetaminophen (TYLENOL) suppository 650 mg  650 mg Rectal Q6H PRN Oswald Hillock, MD      . allopurinol (ZYLOPRIM) tablet 300 mg  300 mg Oral Daily Oswald Hillock, MD   300 mg at 10/16/14 [id number]  .  alteplase (CATHFLO ACTIVASE) injection 2 mg  2 mg Intracatheter Once PRN Manpreet Toya Smothers, MD      . cloNIDine (CATAPRES) tablet 0.2 mg  0.2 mg Oral Q8H PRN Oswald Hillock, MD      . docusate sodium (COLACE) capsule 100 mg  100 mg Oral BID Oswald Hillock, MD   100 mg at 10/16/14 0927  . [START ON 10/17/2014] epoetin alfa (EPOGEN,PROCRIT) injection 4,000 Units  4,000 Units Subcutaneous Q T,Th,Sa-HD Manpreet Toya Smothers, MD      . feeding supplement (ENSURE COMPLETE) (ENSURE COMPLETE) liquid 237 mL  237 mL Oral BID BM Oswaldo Milian, RD   237 mL at 10/16/14 1000  . feeding supplement (NEPRO CARB STEADY) liquid 237 mL  237 mL Oral PRN Manpreet Toya Smothers, MD      . heparin injection 1,000 Units  1,000 Units Dialysis PRN Manpreet Toya Smothers, MD      . hydrALAZINE (APRESOLINE) tablet 100 mg  100 mg Oral TID Oswald Hillock, MD   100 mg at 10/16/14 0928  . iron polysaccharides (NIFEREX) capsule 300 mg  300 mg Oral Daily Oswald Hillock, MD   300 mg at 10/16/14 [id number]  . labetalol (NORMODYNE) tablet 300 mg  300 mg Oral TID Oswald Hillock, MD   300 mg at 10/16/14 [id number]  . levothyroxine (SYNTHROID, LEVOTHROID) tablet 50 mcg  50 mcg Oral QAC  breakfast Oswald Hillock, MD   50 mcg at 10/16/14 0831  . lidocaine (PF) (XYLOCAINE) 1 % injection 5 mL  5 mL Intradermal PRN Manpreet Toya Smothers, MD      . lidocaine-prilocaine (EMLA) cream 1 application  1 application Topical PRN Oswald Hillock, MD      . lidocaine-prilocaine (EMLA) cream 1 application  1 application Topical PRN Manpreet Toya Smothers, MD      . meclizine (ANTIVERT) tablet 25 mg  25 mg Oral TID PRN Oswald Hillock, MD      . NIFEdipine (PROCARDIA-XL/ADALAT-CC/NIFEDICAL-XL) 24 hr tablet 60 mg  60 mg Oral BID Oswald Hillock, MD   60 mg at 10/15/14 2241  . ondansetron (ZOFRAN) tablet 4 mg  4 mg Oral Q6H PRN Oswald Hillock, MD       Or  . ondansetron (ZOFRAN) injection 4 mg  4 mg Intravenous Q6H PRN Oswald Hillock, MD      . oxyCODONE-acetaminophen (PERCOCET/ROXICET) 5-325 MG per tablet 1 tablet  1 tablet Oral Q6H PRN Oswald Hillock, MD      . pantoprazole (PROTONIX) EC tablet 40 mg  40 mg Oral Daily Oswald Hillock, MD   40 mg at 10/16/14 [id number]  . pentafluoroprop-tetrafluoroeth (GEBAUERS) aerosol 1 application  1 application Topical PRN Manpreet Toya Smothers, MD      . pentafluoroprop-tetrafluoroeth (GEBAUERS) aerosol           . sevelamer carbonate (RENVELA) tablet 2,400 mg  2,400 mg Oral TID WC Oswald Hillock, MD   2,400 mg at 10/16/14 1228  . sevelamer carbonate (RENVELA) tablet 800 mg  800 mg Oral BID PRN Oswald Hillock, MD      . sodium bicarbonate tablet 650 mg  650 mg Oral BID Oswald Hillock, MD   650 mg at 10/16/14 1228  . sodium chloride 0.9 % injection 3 mL  3 mL Intravenous Q12H Oswald Hillock, MD   3 mL at 10/16/14 1230  . sodium chloride 0.9 % injection 3 mL  3 mL Intravenous  PRN Oswald Hillock, MD        Allergies as of 10/14/2014 - Review Complete 10/14/2014  Allergen Reaction Noted  . Cephalexin Itching 04/08/2011    Family History  Problem Relation Age of Onset  . Colon cancer Neg Hx   . Cancer Mother   . Diabetes Mother   . Hypertension Mother   . Diabetes Father   . Hypertension Father    . Hypertension Sister     History   Social History  . Marital Status: Married    Spouse Name: N/A  . Number of Children: N/A  . Years of Education: N/A   Occupational History  . Not on file.   Social History Main Topics  . Smoking status: Never Smoker   . Smokeless tobacco: Never Used  . Alcohol Use: No  . Drug Use: No  . Sexual Activity: Not on file   Other Topics Concern  . 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    Review of Systems: See HPI, otherwise normal ROS  Physical Exam: Temp:  [97.3 F (36.3 C)-98.4 F (36.9 C)] 98 F (36.7 C) (03/16 1245) Pulse Rate:  [72-101] 77 (03/16 1529) Resp:  [14-20] 16 (03/16 1346) BP: (176-215)/(75-103) 199/101 mmHg (03/16 1529) SpO2:  [94 %-100 %] 96 % (03/16 1245) Weight:  [141 lb 5 oz (64.1 kg)] 141 lb 5 oz (64.1 kg) (03/16 1245) Last BM Date: 10/14/14  Patient was evaluated while she was getting hemodialysis. Patient is alert and in no acute distress. Conjunctiva was pale. Sclerae nonicteric. Oropharyngeal mucosa is normal. She has upper and lower dentures in place. No neck masses or thyromegaly noted. Cardiac exam with regular rhythm normal S1 and S2. No murmur or gallop noted. Lungs clear to auscultation. Abdomen is symmetrical. On palpation it is soft and nontender without organomegaly or masses. Extremities are thin without edema or clubbing.      Intake/Output from previous day: 03/15 0701 - 03/16 0700 In: 720 [P.O.:720] Out: -  Intake/Output this shift:    Lab Results:  Recent Labs  10/14/14 1608 10/15/14 0253  WBC 3.9* 4.0  HGB 8.6* 7.9*  HCT 29.1* 25.5*  PLT 191 176   BMET  Recent Labs  10/14/14 1608 10/15/14 0253  NA 137 138  K 3.0* 3.0*  CL 100 101  CO2 28 27  GLUCOSE 106* 95  BUN 21 23  CREATININE 7.56* 8.01*  CALCIUM 9.5 8.8   LFT  Recent Labs  10/15/14 0253  PROT 5.8*  ALBUMIN 2.9*  AST 12  ALT 7  ALKPHOS 28*   BILITOT 0.4   PT/INR No results for input(s): LABPROT, INR in the last 72 hours. Hepatitis Panel No results for input(s): HEPBSAG, HCVAB, HEPAIGM, HEPBIGM in the last 72 hours.  Studies/Results: Dg Chest 2 View  10/14/2014   CLINICAL DATA:  Hypertension and dizziness  EXAM: CHEST  2 VIEW  COMPARISON:  09/17/2013  FINDINGS: There is chronic moderate cardiomegaly and aortic tortuosity.  Trace left pleural effusion which is new. No edema or suspected pneumonia. No pneumothorax. No acute osseous findings.  IMPRESSION: 1. Trace left pleural effusion. 2. Stable cardiomegaly.   Electronically Signed   By: Monte Fantasia M.D.   On: 10/14/2014 16:24   Ct Head Wo Contrast  10/15/2014   CLINICAL DATA:  Abnormal MRI with dizziness and weakness for 1 month. Intracranial bleed.  EXAM: CT HEAD WITHOUT CONTRAST  TECHNIQUE: Contiguous axial images were obtained from the base of the skull through the vertex without intravenous contrast.  COMPARISON:  10/14/2014  FINDINGS: Skull and Sinuses:No acute findings.  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 Herniated temporal lobe is better seen on recent MRI imaging.  Orbits: No acute abnormality.  Brain: An isodense to hypodense hemorrhage in or below the posterior left putamen shows no visible enlargement. There is extensive chronic small vessel disease with confluent ischemic gliosis throughout the bilateral cerebral white matter and remote bilateral thalamic infarcts. Marked disease of the pons is better seen on previous MRI. No evidence of acute infarct, hydrocephalus, or shift.  IMPRESSION: 1. Stable appearance of subacute and isodense left basal ganglia hematoma. No acute hemorrhage. 2. Advanced chronic small vessel disease. 3. 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   Electronically Signed   By:  Monte Fantasia M.D.   On: 10/15/2014 12:46   Ct Head Wo Contrast  10/14/2014   CLINICAL DATA:  Frequent falls for 1 month, elevated blood pressure and dizziness today, history hypertension, chronic kidney disease  EXAM: CT HEAD WITHOUT CONTRAST  TECHNIQUE: Contiguous axial images were obtained from the base of the skull through the vertex without intravenous contrast.  COMPARISON:  12/02/2008  FINDINGS: Normal ventricular morphology.  No midline shift or mass effect.  Extensive small vessel chronic ischemic changes of deep cerebral white matter.  No intracranial hemorrhage or evidence acute infarction.  No extra-axial fluid collection.  Mastoid air cells and middle ear cavities appear clear.  Fluid attenuation mass at identified at LEFT sphenoid sinus increased in size since previous exam now 2.4 x 3.4 cm.  This contains a 10 mm diameter soft tissue component laterally.  This appears to be associated with a defect in the medial wall of the LEFT middle cranial fossa.  Potentially this could represent a small encephalocele with meninges and CSF protruding into the sphenoid sinus rather than a mucosal retention cyst.  Atherosclerotic calcifications of the carotid siphons.  Calvaria intact.  IMPRESSION: Extensive small vessel chronic ischemic changes of deep cerebral white matter.  Progressive LEFT sphenoid sinus opacification containing fluid components as well as a soft tissue nodular component laterally ; potentially this may represent herniation of meninges and a portion of the anterior LEFT temporal lobe/encephalocele through a defect in the wall the middle cranial fossa into the sphenoid sinus rather than a complicated mucosal retention cyst/mucocele of the sphenoid sinus.  MR imaging with and without contrast recommended for further assessment.  Findings called to Dr. Stark Jock on 10/14/2014 at 0530 hours.   Electronically Signed   By: Lavonia Dana M.D.   On: 10/14/2014 17:31   Mr [name] Nam Head Wo  Contrast  10/14/2014   CLINICAL DATA:  Weakness and dizziness for 1 month. History of renal failure. Initial encounter.  EXAM: MRI HEAD WITHOUT CONTRAST  MRA HEAD WITHOUT CONTRAST  TECHNIQUE: Multiplanar, multiecho pulse sequences of the brain and surrounding structures were obtained without intravenous contrast. Angiographic images of the head were obtained using MRA technique without contrast.  COMPARISON:  CT head earlier today.  FINDINGS: MRI HEAD FINDINGS  There is a moderate sized area of restricted diffusion in the external capsule on the LEFT, slightly involving the lentiform nucleus and adjacent to the posterior limb internal capsule. Cross-sectional measurements are 11 x 15 mm. This lesion is hyperintense on T1,  T2, and FLAIR imaging, with isointense central component on gradient sequence and peripheral T2 shortening. I believe this represents a subacute intracerebral hematoma which has become isodense on CT scan as the lesion is virtually inapparent. There is moderate surrounding vasogenic edema which accompanies extensive T2 and FLAIR hyperintensity throughout the remainder the white matter representing baseline chronic microvascular ischemic change.  The greater wing of the sphenoid on the LEFT is dehiscent. There is a gliotic nubbin of LEFT medial temporal lobe measuring 10 x 11 mm cross-section projecting into and expanded sphenoid sinus. Contained T2 hyperintense fluid as overall cross-sectional measurements considerably larger, measuring 33 x 25 mm cross-section. 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 Neurosurgical consultation is warranted.  Marked T2 hyperintensity accompanies the pons and midbrain likely extensive chronic microvascular ischemic change or. The no restricted diffusion is associated. Tiny focus of susceptibility in the RIGHT paramedian pons as seen on image 7 series 8 with slight heterogeneity of signal, could also represent a subacute  bleed. Multiple micro bleeds are seen throughout both cerebral and both cerebellar hemispheres of a chronic nature.  No sinus air-fluid level. Negative orbits. Trace BILATERAL mastoid effusions. Carotid, basilar, and vertebral flow voids are maintained.  MRA HEAD FINDINGS  RIGHT ICA: Moderate dolichoectasia of the petrous segment. Minor irregularity of the cavernous segment. Supraclinoid ICA tandem stenoses estimated 50-75%. Normal appearing ICA terminus.  LEFT ICA: Minor irregularity of the petrous and cavernous segments without flow reducing lesion. No supraclinoid or ICA terminus abnormality.  Basilar artery:  Minor irregularity without flow-limiting stenosis.  Vertebral arteries: RIGHT vertebral dominant. Severe stenosis of the distal LEFT vertebral at the basilar junction, estimated 90%.  Anterior cerebral arteries: Moderate irregularity on the RIGHT. LEFT A1 ACA dominant. No distal anterior cerebral stenosis.  Middle cerebral arteries: No proximal M1 stenosis on the RIGHT or LEFT. Moderate irregularity at the M2 and M3 segments consistent with intracranial atherosclerotic disease.  Posterior cerebral arteries: Minor irregularity without flow reducing proximal lesion.  Cerebellar arteries: Moderately diseased LEFT AICA. LEFT PICA not visualized. Normal-appearing superior cerebellar arteries.  IMPRESSION: Suspect subacute LEFT external capsule intracerebral hematoma, 11 x 15 mm cross-section. 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 Contained CSF projects within the enlarged LEFT sphenoid sinus. Neurosurgical consultation is warranted.  Extensive chronic microvascular ischemic change throughout the cerebral hemispheres and brainstem.  Potentially flow reducing stenoses of the supraclinoid ICA, distal  LEFT vertebral, and cerebellar vessels.   Electronically Signed   By: Rolla Flatten M.D.   On: 10/14/2014 19:58   Mr Brain Wo Contrast  10/14/2014   CLINICAL DATA:  Weakness and dizziness for 1 month. History of renal failure. Initial encounter.  EXAM: MRI HEAD WITHOUT CONTRAST  MRA HEAD WITHOUT CONTRAST  TECHNIQUE: Multiplanar, multiecho pulse sequences of the brain and surrounding structures were obtained without intravenous contrast. Angiographic images of the head were obtained using MRA technique without contrast.  COMPARISON:  CT head earlier today.  FINDINGS: MRI HEAD FINDINGS  There is a moderate sized area of restricted diffusion in the external capsule on the LEFT, slightly involving the lentiform nucleus and adjacent to the posterior limb internal capsule. Cross-sectional measurements are 11 x 15 mm. This lesion is hyperintense on T1, T2, and FLAIR imaging, with isointense central component on gradient sequence and peripheral T2 shortening. I  believe this represents a subacute intracerebral hematoma which has become isodense on CT scan as the lesion is virtually inapparent. There is moderate surrounding vasogenic edema which accompanies extensive T2 and FLAIR hyperintensity throughout the remainder the white matter representing baseline chronic microvascular ischemic change.  The greater wing of the sphenoid on the LEFT is dehiscent. There is a gliotic nubbin of LEFT medial temporal lobe measuring 10 x 11 mm cross-section projecting into and expanded sphenoid sinus. Contained T2 hyperintense fluid as overall cross-sectional measurements considerably larger, measuring 33 x 25 mm cross-section. 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 Neurosurgical consultation is warranted.  Marked T2 hyperintensity accompanies the pons and midbrain likely extensive chronic microvascular ischemic change or. The no restricted diffusion is associated. Tiny focus of susceptibility in  the RIGHT paramedian pons as seen on image 7 series 8 with slight heterogeneity of signal, could also represent a subacute bleed. Multiple micro bleeds are seen throughout both cerebral and both cerebellar hemispheres of a chronic nature.  No sinus air-fluid level. Negative orbits. Trace BILATERAL mastoid effusions. Carotid, basilar, and vertebral flow voids are maintained.  MRA HEAD FINDINGS  RIGHT ICA: Moderate dolichoectasia of the petrous segment. Minor irregularity of the cavernous segment. Supraclinoid ICA tandem stenoses estimated 50-75%. Normal appearing ICA terminus.  LEFT ICA: Minor irregularity of the petrous and cavernous segments without flow reducing lesion. No supraclinoid or ICA terminus abnormality.  Basilar artery:  Minor irregularity without flow-limiting stenosis.  Vertebral arteries: RIGHT vertebral dominant. Severe stenosis of the distal LEFT vertebral at the basilar junction, estimated 90%.  Anterior cerebral arteries: Moderate irregularity on the RIGHT. LEFT A1 ACA dominant. No distal anterior cerebral stenosis.  Middle cerebral arteries: No proximal M1 stenosis on the RIGHT or LEFT. Moderate irregularity at the M2 and M3 segments consistent with intracranial atherosclerotic disease.  Posterior cerebral arteries: Minor irregularity without flow reducing proximal lesion.  Cerebellar arteries: Moderately diseased LEFT AICA. LEFT PICA not visualized. Normal-appearing superior cerebellar arteries.  IMPRESSION: Suspect subacute LEFT external capsule intracerebral hematoma, 11 x 15 mm cross-section. 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 Contained CSF projects within the enlarged LEFT sphenoid sinus. Neurosurgical consultation is warranted.  Extensive chronic microvascular ischemic  change throughout the cerebral hemispheres and brainstem.  Potentially flow reducing stenoses of the supraclinoid ICA, distal LEFT vertebral, and cerebellar vessels.   Electronically Signed   By: Rolla Flatten M.D.   On: 10/14/2014 19:58    Assessment; Patient is 64 year old African-American female with multiple medical problems who has intermittent solid food dysphagia. He has chronic GERD and history of stricture at GE junction which was last dilated in November 2014. This or symptoms this stricture needs to be redilated. Patient was diagnosed with intracerebral bleed/hematoma and been treated conservatively. Since dysphagia is not at every meal, had preferred to wait a few weeks for she is subjected to EGD and dilation.  Chronic anemia. No evidence of GI bleed.  Recommendations; Should advised to stay on soft diet and chew her food thoroughly. Will arrange EGD with dilation with Dr. Oneida Alar in 4 weeks.   LOS: 2 days   REHMAN,NAJEEB U  10/16/2014, 3:41 PM

## 2014-10-16 NOTE — Telephone Encounter (Signed)
LMOM to call.

## 2014-10-16 NOTE — Progress Notes (Signed)
[facility] PROGRESS NOTE  TRENECIA [name] [id number] DOB: [birth year] DOA: 10/14/2014 PCP: [name], MD  Assessment/Plan: Intracerebral Hematoma -Stable, per repeat CT scan. -Dr. Arnoldo Morale consulted on admission opined no surgical intervention required. -Follow up with neurosurgery as an OP.  Dizziness -Suspect related to above. -Seen by PT with recommendations for HHPT.  HTN -BP improved today. -Should improve even further with HD.  ESRD -On HD TTS. -HD yesterday not done due to emergent case. -Will go for HD today. -Appreciate renal input.  Hypokalemia -To correct during HD.  Dysphagia and Weight Loss -With h/o of an esophageal stricture, will request GI consultation for ?esophageal dilatation.  Code Status: Full Code Family Communication: patient only Disposition Plan: Home likely in am vs later today depending on GI eval.   Consultants:  Renal  GI   Antibiotics:  None   Subjective: C/o dysphagia and decreased appetite with weight loss. Confused last night. Feels like someone may have been trying to hurt her ("tech put some poison in her tele box").  Objective: Filed Vitals:   10/15/14 2217 10/16/14 0632 10/16/14 0808 10/16/14 1032  BP: 178/86 212/91 215/103 180/97  Pulse: 78 97 101   Temp: 98.4 F (36.9 C) 98.2 F (36.8 C)    TempSrc: Oral Oral    Resp: 20 14    Height:      Weight:      SpO2: 100% 94%      Intake/Output Summary (Last 24 hours) at 10/16/14 1317 Last data filed at 10/15/14 1843  Gross per 24 hour  Intake    480 ml  Output      0 ml  Net    480 ml   Filed Weights   10/14/14 1529 10/14/14 2231 10/15/14 0448  Weight: 64.411 kg (142 lb) 64.2 kg (141 lb 8.6 oz) 64.1 kg (141 lb 5 oz)    Exam:   General:  AA Ox3  Cardiovascular: RRR  Respiratory: CTA B  Abdomen: S/NT/ND/+BS  Extremities: no C/C/E   Neurologic:  Non-focal  Data Reviewed: Basic Metabolic Panel:  Recent Labs Lab  10/14/14 1608 10/15/14 0253 10/15/14 0254  NA 137 138  --   K 3.0* 3.0*  --   CL 100 101  --   CO2 28 27  --   GLUCOSE 106* 95  --   BUN 21 23  --   CREATININE 7.56* 8.01*  --   CALCIUM 9.5 8.8  --   MG  --   --  1.8   Liver Function Tests:  Recent Labs Lab 10/14/14 1608 10/15/14 0253  AST 16 12  ALT 9 7  ALKPHOS 30* 28*  BILITOT 0.5 0.4  PROT 6.9 5.8*  ALBUMIN 3.5 2.9*   No results for input(s): LIPASE, AMYLASE in the last 168 hours. No results for input(s): AMMONIA in the last 168 hours. CBC:  Recent Labs Lab 10/14/14 1608 10/15/14 0253  WBC 3.9* 4.0  NEUTROABS 2.0  --   HGB 8.6* 7.9*  HCT 29.1* 25.5*  MCV 76.0* 74.8*  PLT 191 176   Cardiac Enzymes: No results for input(s): CKTOTAL, CKMB, CKMBINDEX, TROPONINI in the last 168 hours. BNP (last 3 results)  Recent Labs  10/14/14 1608  BNP 1512.0*    ProBNP (last 3 results) No results for input(s): PROBNP in the last 8760 hours.  CBG: No results for input(s): GLUCAP in the last 168 hours.  No results found for this or any previous visit (from the  past 240 hour(s)).   Studies: Dg Chest 2 View  10/14/2014   CLINICAL DATA:  Hypertension and dizziness  EXAM: CHEST  2 VIEW  COMPARISON:  09/17/2013  FINDINGS: There is chronic moderate cardiomegaly and aortic tortuosity.  Trace left pleural effusion which is new. No edema or suspected pneumonia. No pneumothorax. No acute osseous findings.  IMPRESSION: 1. Trace left pleural effusion. 2. Stable cardiomegaly.   Electronically Signed   By: Monte Fantasia M.D.   On: 10/14/2014 16:24   Ct Head Wo Contrast  10/15/2014   CLINICAL DATA:  Abnormal MRI with dizziness and weakness for 1 month. Intracranial bleed.  EXAM: CT HEAD WITHOUT CONTRAST  TECHNIQUE: Contiguous axial images were obtained from the base of the skull through the vertex without intravenous contrast.  COMPARISON:  10/14/2014  FINDINGS: Skull and Sinuses:No acute findings.  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 Herniated temporal lobe is better seen on recent MRI imaging.  Orbits: No acute abnormality.  Brain: An isodense to hypodense hemorrhage in or below the posterior left putamen shows no visible enlargement. There is extensive chronic small vessel disease with confluent ischemic gliosis throughout the bilateral cerebral white matter and remote bilateral thalamic infarcts. Marked disease of the pons is better seen on previous MRI. No evidence of acute infarct, hydrocephalus, or shift.  IMPRESSION: 1. Stable appearance of subacute and isodense left basal ganglia hematoma. No acute hemorrhage. 2. Advanced chronic small vessel disease. 3. 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   Electronically Signed   By: Monte Fantasia M.D.   On: 10/15/2014 12:46   Ct Head Wo Contrast  10/14/2014   CLINICAL DATA:  Frequent falls for 1 month, elevated blood pressure and dizziness today, history hypertension, chronic kidney disease  EXAM: CT HEAD WITHOUT CONTRAST  TECHNIQUE: Contiguous axial images were obtained from the base of the skull through the vertex without intravenous contrast.  COMPARISON:  12/02/2008  FINDINGS: Normal ventricular morphology.  No midline shift or mass effect.  Extensive small vessel chronic ischemic changes of deep cerebral white matter.  No intracranial hemorrhage or evidence acute infarction.  No extra-axial fluid collection.  Mastoid air cells and middle ear cavities appear clear.  Fluid attenuation mass at identified at LEFT sphenoid sinus increased in size since previous exam now 2.4 x 3.4 cm.  This contains a 10 mm diameter soft tissue component laterally.  This appears to be associated with a defect in the medial wall of the LEFT middle cranial fossa.  Potentially this could represent a small encephalocele with  meninges and CSF protruding into the sphenoid sinus rather than a mucosal retention cyst.  Atherosclerotic calcifications of the carotid siphons.  Calvaria intact.  IMPRESSION: Extensive small vessel chronic ischemic changes of deep cerebral white matter.  Progressive LEFT sphenoid sinus opacification containing fluid components as well as a soft tissue nodular component laterally ; potentially this may represent herniation of meninges and a portion of the anterior LEFT temporal lobe/encephalocele through a defect in the wall the middle cranial fossa into the sphenoid sinus rather than a complicated mucosal retention cyst/mucocele of the sphenoid sinus.  MR imaging with and without contrast recommended for further assessment.  Findings called to Dr. Stark Jock on 10/14/2014 at 0530 hours.   Electronically Signed   By: Lavonia Dana M.D.   On: 10/14/2014 17:31   Mr [facility]  Wo Contrast  10/14/2014   CLINICAL DATA:  Weakness and dizziness for 1 month. History of renal failure. Initial encounter.  EXAM: MRI HEAD WITHOUT CONTRAST  MRA HEAD WITHOUT CONTRAST  TECHNIQUE: Multiplanar, multiecho pulse sequences of the brain and surrounding structures were obtained without intravenous contrast. Angiographic images of the head were obtained using MRA technique without contrast.  COMPARISON:  CT head earlier today.  FINDINGS: MRI HEAD FINDINGS  There is a moderate sized area of restricted diffusion in the external capsule on the LEFT, slightly involving the lentiform nucleus and adjacent to the posterior limb internal capsule. Cross-sectional measurements are 11 x 15 mm. This lesion is hyperintense on T1, T2, and FLAIR imaging, with isointense central component on gradient sequence and peripheral T2 shortening. I believe this represents a subacute intracerebral hematoma which has become isodense on CT scan as the lesion is virtually inapparent. There is moderate surrounding vasogenic edema which accompanies extensive T2 and FLAIR  hyperintensity throughout the remainder the white matter representing baseline chronic microvascular ischemic change.  The greater wing of the sphenoid on the LEFT is dehiscent. There is a gliotic nubbin of LEFT medial temporal lobe measuring 10 x 11 mm cross-section projecting into and expanded sphenoid sinus. Contained T2 hyperintense fluid as overall cross-sectional measurements considerably larger, measuring 33 x 25 mm cross-section. 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 Neurosurgical consultation is warranted.  Marked T2 hyperintensity accompanies the pons and midbrain likely extensive chronic microvascular ischemic change or. The no restricted diffusion is associated. Tiny focus of susceptibility in the RIGHT paramedian pons as seen on image 7 series 8 with slight heterogeneity of signal, could also represent a subacute bleed. Multiple micro bleeds are seen throughout both cerebral and both cerebellar hemispheres of a chronic nature.  No sinus air-fluid level. Negative orbits. Trace BILATERAL mastoid effusions. Carotid, basilar, and vertebral flow voids are maintained.  MRA HEAD FINDINGS  RIGHT ICA: Moderate dolichoectasia of the petrous segment. Minor irregularity of the cavernous segment. Supraclinoid ICA tandem stenoses estimated 50-75%. Normal appearing ICA terminus.  LEFT ICA: Minor irregularity of the petrous and cavernous segments without flow reducing lesion. No supraclinoid or ICA terminus abnormality.  Basilar artery:  Minor irregularity without flow-limiting stenosis.  Vertebral arteries: RIGHT vertebral dominant. Severe stenosis of the distal LEFT vertebral at the basilar junction, estimated 90%.  Anterior cerebral arteries: Moderate irregularity on the RIGHT. LEFT A1 ACA dominant. No distal anterior cerebral stenosis.  Middle cerebral arteries: No proximal M1 stenosis on the RIGHT or LEFT. Moderate irregularity at the M2 and M3 segments consistent with  intracranial atherosclerotic disease.  Posterior cerebral arteries: Minor irregularity without flow reducing proximal lesion.  Cerebellar arteries: Moderately diseased LEFT AICA. LEFT PICA not visualized. Normal-appearing superior cerebellar arteries.  IMPRESSION: Suspect subacute LEFT external capsule intracerebral hematoma, 11 x 15 mm cross-section. 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 Contained CSF projects within the enlarged LEFT sphenoid sinus. Neurosurgical consultation is warranted.  Extensive chronic microvascular ischemic change throughout the cerebral hemispheres and brainstem.  Potentially flow reducing stenoses of the supraclinoid ICA, distal LEFT vertebral, and cerebellar vessels.   Electronically Signed   By: Rolla Flatten M.D.   On: 10/14/2014 19:58   Mr Brain Wo Contrast  10/14/2014   CLINICAL DATA:  Weakness and dizziness for 1 month. History  of renal failure. Initial encounter.  EXAM: MRI HEAD WITHOUT CONTRAST  MRA HEAD WITHOUT CONTRAST  TECHNIQUE: Multiplanar, multiecho pulse sequences of the brain and surrounding structures were obtained without intravenous contrast. Angiographic images of the head were obtained using MRA technique without contrast.  COMPARISON:  CT head earlier today.  FINDINGS: MRI HEAD FINDINGS  There is a moderate sized area of restricted diffusion in the external capsule on the LEFT, slightly involving the lentiform nucleus and adjacent to the posterior limb internal capsule. Cross-sectional measurements are 11 x 15 mm. This lesion is hyperintense on T1, T2, and FLAIR imaging, with isointense central component on gradient sequence and peripheral T2 shortening. I believe this represents a subacute intracerebral hematoma which has become isodense on CT scan  as the lesion is virtually inapparent. There is moderate surrounding vasogenic edema which accompanies extensive T2 and FLAIR hyperintensity throughout the remainder the white matter representing baseline chronic microvascular ischemic change.  The greater wing of the sphenoid on the LEFT is dehiscent. There is a gliotic nubbin of LEFT medial temporal lobe measuring 10 x 11 mm cross-section projecting into and expanded sphenoid sinus. Contained T2 hyperintense fluid as overall cross-sectional measurements considerably larger, measuring 33 x 25 mm cross-section. 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 Neurosurgical consultation is warranted.  Marked T2 hyperintensity accompanies the pons and midbrain likely extensive chronic microvascular ischemic change or. The no restricted diffusion is associated. Tiny focus of susceptibility in the RIGHT paramedian pons as seen on image 7 series 8 with slight heterogeneity of signal, could also represent a subacute bleed. Multiple micro bleeds are seen throughout both cerebral and both cerebellar hemispheres of a chronic nature.  No sinus air-fluid level. Negative orbits. Trace BILATERAL mastoid effusions. Carotid, basilar, and vertebral flow voids are maintained.  MRA HEAD FINDINGS  RIGHT ICA: Moderate dolichoectasia of the petrous segment. Minor irregularity of the cavernous segment. Supraclinoid ICA tandem stenoses estimated 50-75%. Normal appearing ICA terminus.  LEFT ICA: Minor irregularity of the petrous and cavernous segments without flow reducing lesion. No supraclinoid or ICA terminus abnormality.  Basilar artery:  Minor irregularity without flow-limiting stenosis.  Vertebral arteries: RIGHT vertebral dominant. Severe stenosis of the distal LEFT vertebral at the basilar junction, estimated 90%.  Anterior cerebral arteries: Moderate irregularity on the RIGHT. LEFT A1 ACA dominant. No distal anterior cerebral stenosis.  Middle  cerebral arteries: No proximal M1 stenosis on the RIGHT or LEFT. Moderate irregularity at the M2 and M3 segments consistent with intracranial atherosclerotic disease.  Posterior cerebral arteries: Minor irregularity without flow reducing proximal lesion.  Cerebellar arteries: Moderately diseased LEFT AICA. LEFT PICA not visualized. Normal-appearing superior cerebellar arteries.  IMPRESSION: Suspect subacute LEFT external capsule intracerebral hematoma, 11 x 15 mm cross-section. 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 Contained CSF projects within the enlarged LEFT sphenoid sinus. Neurosurgical consultation is warranted.  Extensive chronic microvascular ischemic change throughout the cerebral hemispheres and brainstem.  Potentially flow reducing stenoses of the supraclinoid ICA, distal LEFT vertebral, and cerebellar vessels.   Electronically Signed   By: Rolla Flatten M.D.   On: 10/14/2014 19:58    Scheduled Meds: . allopurinol  300 mg Oral Daily  . docusate sodium  100 mg Oral BID  . [START ON 10/17/2014] epoetin (EPOGEN/PROCRIT) injection  4,000 Units Subcutaneous Q T,Th,Sa-HD  .  feeding supplement (ENSURE COMPLETE)  237 mL Oral BID BM  . hydrALAZINE  100 mg Oral TID  . iron polysaccharides  300 mg Oral Daily  . labetalol  300 mg Oral TID  . levothyroxine  50 mcg Oral QAC breakfast  . NIFEdipine  60 mg Oral BID  . pantoprazole  40 mg Oral Daily  . pentafluoroprop-tetrafluoroeth      . sevelamer carbonate  2,400 mg Oral TID WC  . sodium bicarbonate  650 mg Oral BID  . sodium chloride  3 mL Intravenous Q12H   Continuous Infusions:   Principal Problem:   Intracerebral bleed Active Problems:   Hypertensive emergency   Hypokalemia   End stage renal disease   Protein-calorie  malnutrition, severe   Dysphagia    Time spent: 25 minutes. Greater than 50% of this time was spent in direct contact with the patient coordinating care.    Lelon Frohlich  [facility] Pager [phone]  If 7PM-7AM, please contact night-coverage at [email or web address], password [facility] 10/16/2014, 1:17 PM  LOS: 2 days

## 2014-10-16 NOTE — Procedures (Signed)
   HEMODIALYSIS TREATMENT NOTE:  3 hour heparin-free dialysis completed via right upper arm AVF (15g/antegrade).  Goal met:  0.5 liters removed.  No interruption in ultrafiltration.  All blood reinfused and hemostasis achieved within 12 minutes. Hypertensive throughout HD session; no sodium variation used.  Post HD BP 190/101.  Report called to Aldona Lento, RN.  Rockwell Alexandria, RN, CDN

## 2014-10-16 NOTE — Progress Notes (Signed)
Sonya [name]  MRN: [id number]  DOB/AGE: 66-May-1952 66 y.o.  Primary Care Physician:FUSCO,LAWRENCE J., MD  Admit date: 10/14/2014  Chief Complaint:  Chief Complaint  Patient presents with  . Hypertension  . Dizziness    S-Pt presented on  10/14/2014 with  Chief Complaint  Patient presents with  . Hypertension  . Dizziness  .    Pt today feels better    Pt offers no new complaints.  meds . allopurinol  300 mg Oral Daily  . docusate sodium  100 mg Oral BID  . [START ON 10/17/2014] epoetin (EPOGEN/PROCRIT) injection  4,000 Units Subcutaneous Q T,Th,Sa-HD  . feeding supplement (ENSURE COMPLETE)  237 mL Oral BID BM  . hydrALAZINE  100 mg Oral TID  . iron polysaccharides  300 mg Oral Daily  . labetalol  300 mg Oral TID  . levothyroxine  50 mcg Oral QAC breakfast  . NIFEdipine  60 mg Oral BID  . pantoprazole  40 mg Oral Daily  . sevelamer carbonate  2,400 mg Oral TID WC  . sodium bicarbonate  650 mg Oral BID  . sodium chloride  3 mL Intravenous Q12H       Physical Exam: Vital signs in last 24 hours: Temp:  [97.3 F (36.3 C)-98.4 F (36.9 C)] 98.2 F (36.8 C) (03/16 [id number]) Pulse Rate:  [72-101] 101 (03/16 0808) Resp:  [14-20] 14 (03/16 [id number]) BP: (176-215)/(75-103) 215/103 mmHg (03/16 0808) SpO2:  [94 %-100 %] 94 % (03/16 [id number]) Weight change:  Last BM Date: 10/14/14  Intake/Output from previous day: 03/15 0701 - 03/16 0700 In: 720 [P.O.:720] Out: -      Physical Exam: General- pt is awake,alert, oriented to time place and person Resp- No acute REsp distress, CTA B/L NO Rhonchi CVS- S1S2 regular in rate and rhythm GIT- BS+, soft, NT, ND EXT- NO LE Edema, Cyanosis Access- AVF+   Lab Results: CBC  Recent Labs  10/14/14 1608 10/15/14 0253  WBC 3.9* 4.0  HGB 8.6* 7.9*  HCT 29.1* 25.5*  PLT 191 176    BMET  Recent Labs  10/14/14 1608 10/15/14 0253  NA 137 138  K 3.0* 3.0*  CL 100 101  CO2 28 27  GLUCOSE 106* 95  BUN 21 23  CREATININE  7.56* 8.01*  CALCIUM 9.5 8.8    MICRO No results found for this or any previous visit (from the past 240 hour(s)).    Lab Results  Component Value Date   PTH 473.2* 06/09/2013   CALCIUM 8.8 10/15/2014   PHOS 4.7* 08/19/2014         Impression: 1)Renal ESRD on Hd .  Pt on TTS schedule as outpt               Pt was not able to be dialyzed yesterday , Will dialyze today  2)HTN BP better Medication- On Calcium Channel Blockers On Alpha and beta Blockers On Vasodilators. On [employer].  3)Anemia HGb not at goal (9--11)   Will keep on epo during Hd  4)CKD Mineral-Bone Disorder PTH elevated  Secondary Hyperparathyroidism present Phosphorus at goal.   5)CNS- hx of cerebral bleed Primary MD following  6)Electrolytes  Hypokalemic  repleted NOrmonatremic   7)Acid base Co2 at goal  8) GI - c/o difficulty swallowing  Primary team planing to ask for GI  Pt known to Dr Oneida Alar.    Plan:  Pt BP still high, will suggest to change clonidine to regular dose instead of PRN.  [name] 10/16/2014, 10:10 AM

## 2014-10-17 ENCOUNTER — Encounter (HOSPITAL_COMMUNITY): Payer: Self-pay

## 2014-10-17 ENCOUNTER — Emergency Department (HOSPITAL_COMMUNITY): Payer: Medicare Other

## 2014-10-17 ENCOUNTER — Inpatient Hospital Stay (HOSPITAL_COMMUNITY)
Admission: EM | Admit: 2014-10-17 | Discharge: 2014-10-23 | DRG: 682 | Disposition: A | Discharge status: Skilled Nursing Facility | Payer: Medicare Other | Attending: Internal Medicine | Admitting: Internal Medicine

## 2014-10-17 DIAGNOSIS — E43 Unspecified severe protein-calorie malnutrition: Secondary | ICD-10-CM | POA: Diagnosis not present

## 2014-10-17 DIAGNOSIS — D638 Anemia in other chronic diseases classified elsewhere: Secondary | ICD-10-CM | POA: Diagnosis present

## 2014-10-17 DIAGNOSIS — Z79899 Other long term (current) drug therapy: Secondary | ICD-10-CM

## 2014-10-17 DIAGNOSIS — Z681 Body mass index (BMI) 19 or less, adult: Secondary | ICD-10-CM | POA: Diagnosis not present

## 2014-10-17 DIAGNOSIS — N19 Unspecified kidney failure: Secondary | ICD-10-CM | POA: Diagnosis not present

## 2014-10-17 DIAGNOSIS — J90 Pleural effusion, not elsewhere classified: Secondary | ICD-10-CM | POA: Diagnosis not present

## 2014-10-17 DIAGNOSIS — E039 Hypothyroidism, unspecified: Secondary | ICD-10-CM | POA: Diagnosis present

## 2014-10-17 DIAGNOSIS — I611 Nontraumatic intracerebral hemorrhage in hemisphere, cortical: Secondary | ICD-10-CM | POA: Diagnosis not present

## 2014-10-17 DIAGNOSIS — I120 Hypertensive chronic kidney disease with stage 5 chronic kidney disease or end stage renal disease: Principal | ICD-10-CM | POA: Diagnosis present

## 2014-10-17 DIAGNOSIS — Z8249 Family history of ischemic heart disease and other diseases of the circulatory system: Secondary | ICD-10-CM | POA: Diagnosis not present

## 2014-10-17 DIAGNOSIS — R2689 Other abnormalities of gait and mobility: Secondary | ICD-10-CM | POA: Diagnosis not present

## 2014-10-17 DIAGNOSIS — Z90710 Acquired absence of both cervix and uterus: Secondary | ICD-10-CM

## 2014-10-17 DIAGNOSIS — Z809 Family history of malignant neoplasm, unspecified: Secondary | ICD-10-CM

## 2014-10-17 DIAGNOSIS — K219 Gastro-esophageal reflux disease without esophagitis: Secondary | ICD-10-CM | POA: Diagnosis present

## 2014-10-17 DIAGNOSIS — Z992 Dependence on renal dialysis: Secondary | ICD-10-CM

## 2014-10-17 DIAGNOSIS — M1040 Other secondary gout, unspecified site: Secondary | ICD-10-CM | POA: Diagnosis not present

## 2014-10-17 DIAGNOSIS — M109 Gout, unspecified: Secondary | ICD-10-CM | POA: Diagnosis present

## 2014-10-17 DIAGNOSIS — I619 Nontraumatic intracerebral hemorrhage, unspecified: Secondary | ICD-10-CM | POA: Diagnosis present

## 2014-10-17 DIAGNOSIS — I614 Nontraumatic intracerebral hemorrhage in cerebellum: Secondary | ICD-10-CM | POA: Diagnosis not present

## 2014-10-17 DIAGNOSIS — I674 Hypertensive encephalopathy: Secondary | ICD-10-CM | POA: Diagnosis present

## 2014-10-17 DIAGNOSIS — N186 End stage renal disease: Secondary | ICD-10-CM | POA: Diagnosis not present

## 2014-10-17 DIAGNOSIS — G9340 Encephalopathy, unspecified: Secondary | ICD-10-CM | POA: Insufficient documentation

## 2014-10-17 DIAGNOSIS — R42 Dizziness and giddiness: Secondary | ICD-10-CM | POA: Diagnosis not present

## 2014-10-17 DIAGNOSIS — Z79891 Long term (current) use of opiate analgesic: Secondary | ICD-10-CM

## 2014-10-17 DIAGNOSIS — D631 Anemia in chronic kidney disease: Secondary | ICD-10-CM | POA: Diagnosis not present

## 2014-10-17 DIAGNOSIS — R410 Disorientation, unspecified: Secondary | ICD-10-CM

## 2014-10-17 DIAGNOSIS — N2581 Secondary hyperparathyroidism of renal origin: Secondary | ICD-10-CM | POA: Diagnosis present

## 2014-10-17 DIAGNOSIS — R4182 Altered mental status, unspecified: Secondary | ICD-10-CM | POA: Diagnosis not present

## 2014-10-17 DIAGNOSIS — Z881 Allergy status to other antibiotic agents status: Secondary | ICD-10-CM | POA: Diagnosis not present

## 2014-10-17 DIAGNOSIS — I639 Cerebral infarction, unspecified: Secondary | ICD-10-CM | POA: Diagnosis not present

## 2014-10-17 DIAGNOSIS — I161 Hypertensive emergency: Secondary | ICD-10-CM

## 2014-10-17 DIAGNOSIS — M6281 Muscle weakness (generalized): Secondary | ICD-10-CM | POA: Diagnosis not present

## 2014-10-17 DIAGNOSIS — I10 Essential (primary) hypertension: Secondary | ICD-10-CM

## 2014-10-17 DIAGNOSIS — D649 Anemia, unspecified: Secondary | ICD-10-CM | POA: Diagnosis not present

## 2014-10-17 DIAGNOSIS — Z5189 Encounter for other specified aftercare: Secondary | ICD-10-CM | POA: Diagnosis not present

## 2014-10-17 DIAGNOSIS — Z8542 Personal history of malignant neoplasm of other parts of uterus: Secondary | ICD-10-CM

## 2014-10-17 DIAGNOSIS — I618 Other nontraumatic intracerebral hemorrhage: Secondary | ICD-10-CM | POA: Diagnosis not present

## 2014-10-17 DIAGNOSIS — Q019 Encephalocele, unspecified: Secondary | ICD-10-CM | POA: Diagnosis not present

## 2014-10-17 LAB — BASIC METABOLIC PANEL
Anion gap: 8 (ref 5–15)
BUN: 18 mg/dL (ref 6–23)
CALCIUM: 9.3 mg/dL (ref 8.4–10.5)
CHLORIDE: 99 mmol/L (ref 96–112)
CO2: 29 mmol/L (ref 19–32)
CREATININE: 6.96 mg/dL — AB (ref 0.50–1.10)
GFR calc non Af Amer: 6 mL/min — ABNORMAL LOW (ref >90)
GFR, EST AFRICAN AMERICAN: 6 mL/min — AB (ref >90)
Glucose, Bld: 100 mg/dL — ABNORMAL HIGH (ref 70–99)
Potassium: 3.9 mmol/L (ref 3.5–5.1)
SODIUM: 136 mmol/L (ref 135–145)

## 2014-10-17 LAB — CBC WITH DIFFERENTIAL/PLATELET
Basophils Absolute: 0 10*3/uL (ref 0.0–0.1)
Basophils Relative: 1 % (ref 0–1)
Eosinophils Absolute: 0.1 10*3/uL (ref 0.0–0.7)
Eosinophils Relative: 4 % (ref 0–5)
HCT: 28.4 % — ABNORMAL LOW (ref 36.0–46.0)
HEMOGLOBIN: 8.6 g/dL — AB (ref 12.0–15.0)
Lymphocytes Relative: 34 % (ref 12–46)
Lymphs Abs: 1.1 10*3/uL (ref 0.7–4.0)
MCH: 22.8 pg — ABNORMAL LOW (ref 26.0–34.0)
MCHC: 30.3 g/dL (ref 30.0–36.0)
MCV: 75.1 fL — AB (ref 78.0–100.0)
Monocytes Absolute: 0.5 10*3/uL (ref 0.1–1.0)
Monocytes Relative: 17 % — ABNORMAL HIGH (ref 3–12)
Neutro Abs: 1.4 10*3/uL — ABNORMAL LOW (ref 1.7–7.7)
Neutrophils Relative %: 44 % (ref 43–77)
Platelets: 175 10*3/uL (ref 150–400)
RBC: 3.78 MIL/uL — AB (ref 3.87–5.11)
RDW: 20.5 % — ABNORMAL HIGH (ref 11.5–15.5)
WBC: 3.1 10*3/uL — ABNORMAL LOW (ref 4.0–10.5)

## 2014-10-17 LAB — MRSA PCR SCREENING: MRSA by PCR: NEGATIVE

## 2014-10-17 MED ORDER — LINACLOTIDE 145 MCG PO CAPS
290.0000 ug | ORAL_CAPSULE | Freq: Every morning | ORAL | Status: DC
  Administered 2014-10-18 – 2014-10-23 (×5): 290 ug via ORAL
  Filled 2014-10-17 (×3): qty 2
  Filled 2014-10-17: qty 1
  Filled 2014-10-17 (×3): qty 2
  Filled 2014-10-17: qty 1
  Filled 2014-10-17 (×2): qty 2

## 2014-10-17 MED ORDER — NEPRO/CARBSTEADY PO LIQD: 237.0000 mL | Freq: Two times a day (BID) | ORAL | Status: DC

## 2014-10-17 MED ORDER — SEVELAMER CARBONATE 800 MG PO TABS: 800.0000 mg | ORAL_TABLET | ORAL | Status: DC | PRN

## 2014-10-17 MED ORDER — ALLOPURINOL 100 MG PO TABS
300.0000 mg | ORAL_TABLET | Freq: Every day | ORAL | Status: DC
Start: 2014-10-17 — End: 2014-10-23
  Administered 2014-10-18 – 2014-10-23 (×6): 300 mg via ORAL
  Filled 2014-10-17 (×3): qty 3
  Filled 2014-10-17: qty 1
  Filled 2014-10-17 (×2): qty 3
  Filled 2014-10-17: qty 1
  Filled 2014-10-17 (×2): qty 3

## 2014-10-17 MED ORDER — SODIUM CHLORIDE 0.9 % IJ SOLN
3.0000 mL | INTRAMUSCULAR | Status: DC | PRN
  Administered 2014-10-18: 3 mL via INTRAVENOUS
  Filled 2014-10-17: qty 3

## 2014-10-17 MED ORDER — SODIUM CHLORIDE 0.9 % IV SOLN: 250.0000 mL | INTRAVENOUS | Status: DC | PRN

## 2014-10-17 MED ORDER — ONDANSETRON HCL 4 MG/2ML IJ SOLN: 4.0000 mg | Freq: Four times a day (QID) | INTRAMUSCULAR | Status: DC | PRN

## 2014-10-17 MED ORDER — HYDRALAZINE HCL 25 MG PO TABS
100.0000 mg | ORAL_TABLET | Freq: Three times a day (TID) | ORAL | Status: DC
  Administered 2014-10-17 – 2014-10-18 (×3): 100 mg via ORAL
  Filled 2014-10-17 (×4): qty 4

## 2014-10-17 MED ORDER — NEPRO/CARBSTEADY PO LIQD: 237.0000 mL | ORAL | Status: DC | PRN

## 2014-10-17 MED ORDER — LIDOCAINE-PRILOCAINE 2.5-2.5 % EX CREA
1.0000 | TOPICAL_CREAM | CUTANEOUS | Status: DC | PRN
Start: 2014-10-17 — End: 2014-10-19
  Filled 2014-10-17: qty 5

## 2014-10-17 MED ORDER — SEVELAMER CARBONATE 800 MG PO TABS
2400.0000 mg | ORAL_TABLET | Freq: Three times a day (TID) | ORAL | Status: DC
  Administered 2014-10-17 – 2014-10-23 (×14): 2400 mg via ORAL
  Filled 2014-10-17 (×17): qty 3

## 2014-10-17 MED ORDER — NIFEDIPINE 10 MG PO CAPS
60.0000 mg | ORAL_CAPSULE | Freq: Two times a day (BID) | ORAL | Status: DC
  Administered 2014-10-18 (×2): 60 mg via ORAL
  Filled 2014-10-17 (×9): qty 6

## 2014-10-17 MED ORDER — DOCUSATE SODIUM 100 MG PO CAPS
100.0000 mg | ORAL_CAPSULE | Freq: Two times a day (BID) | ORAL | Status: DC
  Administered 2014-10-18 – 2014-10-23 (×8): 100 mg via ORAL
  Filled 2014-10-17 (×13): qty 1

## 2014-10-17 MED ORDER — ACETAMINOPHEN 325 MG PO TABS: 650.0000 mg | ORAL_TABLET | Freq: Four times a day (QID) | ORAL | Status: DC | PRN

## 2014-10-17 MED ORDER — LABETALOL HCL 200 MG PO TABS
300.0000 mg | ORAL_TABLET | Freq: Three times a day (TID) | ORAL | Status: DC
  Administered 2014-10-18 (×2): 300 mg via ORAL
  Filled 2014-10-17 (×4): qty 2

## 2014-10-17 MED ORDER — PENTAFLUOROPROP-TETRAFLUOROETH EX AERO
1.0000 "application " | INHALATION_SPRAY | CUTANEOUS | Status: DC | PRN
  Filled 2014-10-17: qty 30

## 2014-10-17 MED ORDER — ONDANSETRON HCL 4 MG PO TABS: 4.0000 mg | ORAL_TABLET | Freq: Four times a day (QID) | ORAL | Status: DC | PRN

## 2014-10-17 MED ORDER — LEVOTHYROXINE SODIUM 50 MCG PO TABS
50.0000 ug | ORAL_TABLET | Freq: Every day | ORAL | Status: DC
  Administered 2014-10-18 – 2014-10-23 (×6): 50 ug via ORAL
  Filled 2014-10-17 (×4): qty 1
  Filled 2014-10-17 (×2): qty 2
  Filled 2014-10-17: qty 1

## 2014-10-17 MED ORDER — LABETALOL HCL 5 MG/ML IV SOLN
2.0000 mg/min | INTRAVENOUS | Status: DC
  Administered 2014-10-17: 2 mg/min via INTRAVENOUS
  Filled 2014-10-17: qty 20

## 2014-10-17 MED ORDER — ALTEPLASE 2 MG IJ SOLR
2.0000 mg | Freq: Once | INTRAMUSCULAR | Status: AC | PRN
  Filled 2014-10-17: qty 2

## 2014-10-17 MED ORDER — PANTOPRAZOLE SODIUM 40 MG PO TBEC
40.0000 mg | DELAYED_RELEASE_TABLET | Freq: Every day | ORAL | Status: DC
  Administered 2014-10-18 – 2014-10-23 (×5): 40 mg via ORAL
  Filled 2014-10-17 (×8): qty 1

## 2014-10-17 MED ORDER — HEPARIN SODIUM (PORCINE) 1000 UNIT/ML DIALYSIS
1000.0000 [IU] | INTRAMUSCULAR | Status: DC | PRN
  Filled 2014-10-17: qty 1

## 2014-10-17 MED ORDER — LIDOCAINE HCL (PF) 1 % IJ SOLN: 5.0000 mL | INTRAMUSCULAR | Status: DC | PRN

## 2014-10-17 MED ORDER — ACETAMINOPHEN 650 MG RE SUPP: 650.0000 mg | Freq: Four times a day (QID) | RECTAL | Status: DC | PRN

## 2014-10-17 MED ORDER — SODIUM CHLORIDE 0.9 % IV SOLN: 100.0000 mL | INTRAVENOUS | Status: DC | PRN

## 2014-10-17 MED ORDER — SODIUM CHLORIDE 0.9 % IJ SOLN
3.0000 mL | Freq: Two times a day (BID) | INTRAMUSCULAR | Status: DC
  Administered 2014-10-17 – 2014-10-18 (×2): 3 mL via INTRAVENOUS

## 2014-10-17 MED ORDER — LABETALOL HCL 5 MG/ML IV SOLN
5.0000 mg | Freq: Once | INTRAVENOUS | Status: DC
  Filled 2014-10-17: qty 4

## 2014-10-17 MED ORDER — SODIUM BICARBONATE 650 MG PO TABS
650.0000 mg | ORAL_TABLET | Freq: Two times a day (BID) | ORAL | Status: DC
  Administered 2014-10-18 – 2014-10-19 (×3): 650 mg via ORAL
  Filled 2014-10-17 (×6): qty 1

## 2014-10-17 NOTE — H&P (Signed)
[facility]          History and Physical    PCP:   Sonya [name]., MD   Chief Complaint:  Confusion  HPI: Patient is a pleasant 64 year old woman just discharged from the hospital yesterday after workup for dizziness revealed an intracerebral bleed. She presents today for confusion. Patient and daughter both state that she woke up early this morning to prepare to go to dialysis but had difficulty putting in her teeth and    " her mind was off". She proceeded to come to the hospital. Blood pressure was found to be elevated with systolics in the 672C. Of note, blood pressure had been elevated to the 180s to 190s during prior hospitalization as well and improved after hemodialysis. She was admitted to the ICU on a labetalol drip. By the time I see her she does not appear confused, her blood pressure is currently 150/75. We have been asked to admit her for further evaluation and management.  Allergies:   Allergies  Allergen Reactions  . Cephalexin Itching      Past Medical History  Diagnosis Date  . HTN (hypertension)   . Hypothyroidism   . Uterine cancer 2012  . Anemia   . CKD (chronic kidney disease)   . Dysphagia   . Hyperparathyroidism due to renal insufficiency   . GERD (gastroesophageal reflux disease)   . Diverticulitis     Past Surgical History  Procedure Laterality Date  . Cholecystectomy    . Cesarean section    . Laparoscopic total hysterectomy    . Colonoscopy  2000    TICS, [facility]  . Colonoscopy  2003 NUR BRBPR D50 V6    DC/[place] TICS, [facility]H  . Abdominal hysterectomy    . Nasal septum surgery    . Esophagogastroduodenoscopy (egd) with esophageal dilation N/A 06/08/2013    Dr. Fields:Stricture at the gastroesophageal junction/MILD NON-erosive gastritis (inflammation) was found in the gastric antrum; multiple biopsies/NO SOURCE FOR ANEMIA IDETIFIED-MOST LIKELY DUE TO ANEMIA OF CHRONIC DISEASE  . Colonoscopy N/A 09/04/2013    Procedure:  COLONOSCOPY;  Surgeon: Danie Binder, MD;  [facility];  Service: Endoscopy;  Laterality: N/A;  10:30AM-moved to 9:25 [name]  . Appendectomy    . Bascilic vein transposition Right 09/17/2013    Procedure: BRACHIAL VEIN TRANSPOSITION;  Surgeon: Conrad Prosser, MD;  Location: [place];  Service: Vascular;  Laterality: Right;  . Insertion of dialysis catheter Left 09/17/2013    Procedure: INSERTION OF DIALYSIS CATHETER;  Surgeon: Conrad Anchorage, MD;  Location: [place];  Service: Vascular;  Laterality: Left;  . Bascilic vein transposition Right 10/29/2013    Procedure: RIGHT SECOND STAGE BRACHIAL VEIN TRANSPOSITION;  Surgeon: Conrad Nelson, MD;  Location: [facility] OR;  Service: Vascular;  Laterality: Right;  . Givens capsule study N/A 12/03/2013    Procedure: GIVENS CAPSULE STUDY;  Surgeon: Danie Binder, MD;  [facility];  Service: Endoscopy;  Laterality: N/A;  7:30    Prior to Admission medications   Medication Sig Start Date End Date Taking? Authorizing Provider  allopurinol (ZYLOPRIM) 300 MG tablet Take 300 mg by mouth daily.   Yes Historical Provider, MD  docusate sodium (COLACE) 100 MG capsule Take 100 mg by mouth 2 (two) times daily.   Yes Historical Provider, MD  ferrous sulfate dried (SLOW FE) 160 (50 FE) MG TBCR SR tablet  Take 640 mg by mouth daily.   Yes Historical Provider, MD  hydrALAZINE (APRESOLINE) 100 MG tablet Take 100 mg by mouth 3 (three) times daily.  09/30/13  Yes Historical Provider, MD  labetalol (NORMODYNE) 300 MG tablet Take 1 tablet (300 mg total) by mouth 3 (three) times daily. 08/20/13  Yes Kinnie Feil, MD  levothyroxine (SYNTHROID, LEVOTHROID) 50 MCG tablet Take 1 tablet (50 mcg total) by mouth daily. 08/20/13  Yes Kinnie Feil, MD  lidocaine-prilocaine (EMLA) cream Apply 1 application topically as needed (on Dialysis days Tues,Thurs.,Sat.).   Yes Historical Provider, MD  Linaclotide (LINZESS) 290 MCG CAPS capsule 1 PO 30 MINS PRIOR TO  BREAKFAST. IF O SATISFACTORY BM AFTER 2 WEEKS, TAKE WITH BREAKFAST. Patient taking differently: Take 290 mcg by mouth every morning.  05/01/14  Yes Danie Binder, MD  NIFEdipine (PROCARDIA) 20 MG capsule Take 60 mg by mouth 2 (two) times daily. 09/19/14  Yes Historical Provider, MD  omeprazole (PRILOSEC) 20 MG capsule Take 1 capsule (20 mg total) by mouth daily. 07/29/14  Yes Orvil Feil, NP  oxyCODONE-acetaminophen (PERCOCET/ROXICET) 5-325 MG per tablet Take 1 tablet by mouth every 6 (six) hours as needed (pain).  09/17/13  Yes Historical Provider, MD  sevelamer carbonate (RENVELA) 800 MG tablet Take 800-2,400 mg by mouth 3 (three) times daily with meals. Patient takes 1 tablet with snack and 3 tablets with meal   Yes Historical Provider, MD  sodium bicarbonate 650 MG tablet Take 1 tablet (650 mg total) by mouth 2 (two) times daily. 06/12/13  Yes Kathie Dike, MD    Social History:  reports that she has never smoked. She has never used smokeless tobacco. She reports that she does not drink alcohol or use illicit drugs.  Family History  Problem Relation Age of Onset  . Colon cancer Neg Hx   . Cancer Mother   . Diabetes Mother   . Hypertension Mother   . Diabetes Father   . Hypertension Father   . Hypertension Sister     Review of Systems:  Constitutional: Denies fever, chills, diaphoresis, appetite change and fatigue.  HEENT: Denies photophobia, eye pain, redness, hearing loss, ear pain, congestion, sore throat, rhinorrhea, sneezing, mouth sores, trouble swallowing, neck pain, neck stiffness and tinnitus.   Respiratory: Denies SOB, DOE, cough, chest tightness,  and wheezing.   Cardiovascular: Denies chest pain, palpitations and leg swelling.  Gastrointestinal: Denies nausea, vomiting, abdominal pain, diarrhea, constipation, blood in stool and abdominal distention.  Genitourinary: Denies dysuria, urgency, frequency, hematuria, flank pain and difficulty urinating.  Endocrine: Denies: hot  or cold intolerance, sweats, changes in hair or nails, polyuria, polydipsia. Musculoskeletal: Denies myalgias, back pain, joint swelling, arthralgias and gait problem.  Skin: Denies pallor, rash and wound.  Neurological: Denies dizziness, seizures, syncope, weakness, light-headedness, numbness and headaches.  Hematological: Denies adenopathy. Easy bruising, personal or family bleeding history  Psychiatric/Behavioral: Denies suicidal ideation, mood changes, confusion, nervousness, sleep disturbance and agitation   Physical Exam: Blood pressure 204/90, pulse 87, temperature 97.6 F (36.4 C), temperature source Oral, resp. rate 21, height _0  (1.727 m), weight 64.411 kg (142 lb), SpO2 97 %. General: Alert, awake, oriented 3, no current distress. HEENT: Normocephalic, atraumatic, pupils equal round and reactive to light, moist mucous membranes, mild pharyngeal erythema. Neck: Supple, no JVD, no lymphadenopathy, no bruits, no goiter. Cardiovascular: Regular rate and rhythm, no murmurs, rubs or gallops. Lungs: Clear to auscultation bilaterally. Abdomen: Soft, nontender, nondistended, positive bowel sounds next and  extremities: No clubbing, cyanosis or edema, positive pulses. Neurologic: Currently intact and nonfocal.  Labs on Admission:  Results for orders placed or performed during the hospital encounter of 10/17/14 (from the past 48 hour(s))  CBC with Differential/Platelet     Status: Abnormal   Collection Time: 10/17/14  7:07 AM  Result Value Ref Range   WBC 3.1 (L) 4.0 - 10.5 K/uL   RBC 3.78 (L) 3.87 - 5.11 MIL/uL   Hemoglobin 8.6 (L) 12.0 - 15.0 g/dL   HCT 28.4 (L) 36.0 - 46.0 %   MCV 75.1 (L) 78.0 - 100.0 fL   MCH 22.8 (L) 26.0 - 34.0 pg   MCHC 30.3 30.0 - 36.0 g/dL   RDW 20.5 (H) 11.5 - 15.5 %   Platelets 175 150 - 400 K/uL   Neutrophils Relative % 44 43 - 77 %   Neutro Abs 1.4 (L) 1.7 - 7.7 K/uL   Lymphocytes Relative 34 12 - 46 %   Lymphs Abs 1.1 0.7 - 4.0 K/uL   Monocytes  Relative 17 (H) 3 - 12 %   Monocytes Absolute 0.5 0.1 - 1.0 K/uL   Eosinophils Relative 4 0 - 5 %   Eosinophils Absolute 0.1 0.0 - 0.7 K/uL   Basophils Relative 1 0 - 1 %   Basophils Absolute 0.0 0.0 - 0.1 K/uL  Basic metabolic panel     Status: Abnormal   Collection Time: 10/17/14  7:07 AM  Result Value Ref Range   Sodium 136 135 - 145 mmol/L   Potassium 3.9 3.5 - 5.1 mmol/L   Chloride 99 96 - 112 mmol/L   CO2 29 19 - 32 mmol/L   Glucose, Bld 100 (H) 70 - 99 mg/dL   BUN 18 6 - 23 mg/dL   Creatinine, Ser 6.96 (H) 0.50 - 1.10 mg/dL   Calcium 9.3 8.4 - 10.5 mg/dL   GFR calc non Af Amer 6 (L) >90 mL/min   GFR calc Af Amer 6 (L) >90 mL/min    Comment: (NOTE) The eGFR has been calculated using the CKD EPI equation. This calculation has not been validated in all clinical situations. eGFR's persistently <90 mL/min signify possible Chronic Kidney Disease.    Anion gap 8 5 - 15    Radiological Exams on Admission: Dg Chest 2 View  10/17/2014   CLINICAL DATA:  Renal failure and confusion  EXAM: CHEST  2 VIEW  COMPARISON:  10/14/2014  FINDINGS: Mild cardiomegaly and aortic tortuosity which stable from prior. Trace pleural effusions. There is no edema, consolidation, or pneumothorax.  IMPRESSION: Trace pleural effusions.   Electronically Signed   By: Monte Fantasia M.D.   On: 10/17/2014 07:42   Ct Head Wo Contrast  10/17/2014   CLINICAL DATA:  Dizziness and confusion. Recent left-sided external capsule and lentiform nucleus infarction and hemorrhage.  EXAM: CT HEAD WITHOUT CONTRAST  TECHNIQUE: Contiguous axial images were obtained from the base of the skull through the vertex without intravenous contrast.  COMPARISON:  10/15/2014  FINDINGS: Involving and stable appearance to infarction involving the posterior left basal ganglia and extending to involve the posterior limb of the internal capsule and the adjacent external capsule. No new hemorrhage or mass effect identified. Stable old lacunar  infarcts of bilateral bowel my. No cortical infarction, hydrocephalus or extra-axial fluid collection identified. 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  IMPRESSION: No change in appearance of head CT since the most recent study. 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 No new hemorrhage or infarction identified. Stable encephalocele into left sphenoid sinus.   Electronically Signed   By: Aletta Edouard M.D.   On: 10/17/2014 07:39    Assessment/Plan Principal Problem:   Hypertensive emergency Active Problems:   HTN (hypertension)   End stage renal disease   Intracerebral bleed   Protein-calorie malnutrition, severe   Hypertensive emergency -Presumably, confusion could be related to elevated blood pressures. -Labetalol drip has been started in the emergency department, by the time I see her her blood pressure is already 150/70.  -will give all of her oral blood pressure medications and discontinue IV drip.  -She is also due for dialysis today which should improve blood pressures as well.  Acute encephalopathy -I suspect this is a new normal for her based off of the bleed more so than related to her blood pressure.  -This appears to wax and wane. -Follow.  End-stage renal disease -We will consult nephrology as she is due for dialysis today.  Recent intracerebral bleed  -Neurosurgery, Dr. Arnoldo Morale, reviewed MRI at that time and opined no surgical intervention was warranted and recommended repeat CT scan which was stable. -Patient again had a repeat CT of the head today that again shows stable intracerebral bleed. -She already has outpatient follow-up with Dr. Arnoldo Morale scheduled.  Severe protein caloric malnutrition  DVT prophylaxis -SCDs  CODE STATUS -Full code    Time Spent on Admission: 85 minutes  HERNANDEZ  ACOSTA,ESTELA [facility] Pager: [phone] 10/17/2014, 12:41 PM

## 2014-10-17 NOTE — Consult Note (Signed)
Reason for Consult: End-stage renal disease Referring Physician: Dr. Harlon Ditty is an 64 y.o. female.  HPI: She is a patient who has history of hypertension, hypothyroidism, end-stage renal disease on maintenance hemodialysis presently was brought by her family's because of increased confusion and disorientation. When she was evaluated in emergency room patient was found to have severe hypertension hence admitted to the hospital. Patient was evaluated a couple of days ago because of dizziness, history of fall. During that time an MR was done which showed intra-cerebral bleeding. During that time the decision was made since patient is stable to sent home and followed as an outpatient. Presently patient denies any headache, dizziness or lightheadedness. She states she is feeling better.  Past Medical History  Diagnosis Date  . HTN (hypertension)   . Hypothyroidism   . Uterine cancer 2012  . Anemia   . CKD (chronic kidney disease)   . Dysphagia   . Hyperparathyroidism due to renal insufficiency   . GERD (gastroesophageal reflux disease)   . Diverticulitis     Past Surgical History  Procedure Laterality Date  . Cholecystectomy    . Cesarean section    . Laparoscopic total hysterectomy    . Colonoscopy  2000    TICS, [facility]  . Colonoscopy  2003 NUR BRBPR D50 V6    DC/[place] TICS, IH  . Abdominal hysterectomy    . Nasal septum surgery    . Esophagogastroduodenoscopy (egd) with esophageal dilation N/A 06/08/2013    Dr. Fields:Stricture at the gastroesophageal junction/MILD NON-erosive gastritis (inflammation) was found in the gastric antrum; multiple biopsies/NO SOURCE FOR ANEMIA IDETIFIED-MOST LIKELY DUE TO ANEMIA OF CHRONIC DISEASE  . Colonoscopy N/A 09/04/2013    Procedure: COLONOSCOPY;  Surgeon: West Bali, MD;  [facility];  Service: Endoscopy;  Laterality: N/A;  10:30AM-moved to 9:25 Soledad Gerlach to notify pt  . Appendectomy    . Bascilic vein transposition Right  09/17/2013    Procedure: BRACHIAL VEIN TRANSPOSITION;  Surgeon: Fransisco Hertz, MD;  Location: [facility] OR;  Service: Vascular;  Laterality: Right;  . Insertion of dialysis catheter Left 09/17/2013    Procedure: INSERTION OF DIALYSIS CATHETER;  Surgeon: Fransisco Hertz, MD;  Location: [facility] OR;  Service: Vascular;  Laterality: Left;  . Bascilic vein transposition Right 10/29/2013    Procedure: RIGHT SECOND STAGE BRACHIAL VEIN TRANSPOSITION;  Surgeon: Fransisco Hertz, MD;  Location: [facility] OR;  Service: Vascular;  Laterality: Right;  . Givens capsule study N/A 12/03/2013    Procedure: GIVENS CAPSULE STUDY;  Surgeon: West Bali, MD;  [facility];  Service: Endoscopy;  Laterality: N/A;  7:30    Family History  Problem Relation Age of Onset  . Colon cancer Neg Hx   . Cancer Mother   . Diabetes Mother   . Hypertension Mother   . Diabetes Father   . Hypertension Father   . Hypertension Sister     Social History:  reports that she has never smoked. She has never used smokeless tobacco. She reports that she does not drink alcohol or use illicit drugs.  Allergies:  Allergies  Allergen Reactions  . Cephalexin Itching    Medications: I have reviewed the patient's current medications.  Results for orders placed or performed during the hospital encounter of 10/17/14 (from the past 48 hour(s))  CBC with Differential/Platelet     Status: Abnormal   Collection Time: 10/17/14  7:07 AM  Result Value Ref Range  WBC 3.1 (L) 4.0 - 10.5 K/uL   RBC 3.78 (L) 3.87 - 5.11 MIL/uL   Hemoglobin 8.6 (L) 12.0 - 15.0 g/dL   HCT 28.4 (L) 36.0 - 46.0 %   MCV 75.1 (L) 78.0 - 100.0 fL   MCH 22.8 (L) 26.0 - 34.0 pg   MCHC 30.3 30.0 - 36.0 g/dL   RDW 20.5 (H) 11.5 - 15.5 %   Platelets 175 150 - 400 K/uL   Neutrophils Relative % 44 43 - 77 %   Neutro Abs 1.4 (L) 1.7 - 7.7 K/uL   Lymphocytes Relative 34 12 - 46 %   Lymphs Abs 1.1 0.7 - 4.0 K/uL   Monocytes Relative 17 (H) 3 - 12 %   Monocytes Absolute 0.5 0.1 - 1.0  K/uL   Eosinophils Relative 4 0 - 5 %   Eosinophils Absolute 0.1 0.0 - 0.7 K/uL   Basophils Relative 1 0 - 1 %   Basophils Absolute 0.0 0.0 - 0.1 K/uL  Basic metabolic panel     Status: Abnormal   Collection Time: 10/17/14  7:07 AM  Result Value Ref Range   Sodium 136 135 - 145 mmol/L   Potassium 3.9 3.5 - 5.1 mmol/L   Chloride 99 96 - 112 mmol/L   CO2 29 19 - 32 mmol/L   Glucose, Bld 100 (H) 70 - 99 mg/dL   BUN 18 6 - 23 mg/dL   Creatinine, Ser 6.96 (H) 0.50 - 1.10 mg/dL   Calcium 9.3 8.4 - 10.5 mg/dL   GFR calc non Af Amer 6 (L) >90 mL/min   GFR calc Af Amer 6 (L) >90 mL/min    Comment: (NOTE) The eGFR has been calculated using the CKD EPI equation. This calculation has not been validated in all clinical situations. eGFR's persistently <90 mL/min signify possible Chronic Kidney Disease.    Anion gap 8 5 - 15    Dg Chest 2 View  10/17/2014   CLINICAL DATA:  Renal failure and confusion  EXAM: CHEST  2 VIEW  COMPARISON:  10/14/2014  FINDINGS: Mild cardiomegaly and aortic tortuosity which stable from prior. Trace pleural effusions. There is no edema, consolidation, or pneumothorax.  IMPRESSION: Trace pleural effusions.   Electronically Signed   By: Monte Fantasia M.D.   On: 10/17/2014 07:42   Ct Head Wo Contrast  10/17/2014   CLINICAL DATA:  Dizziness and confusion. Recent left-sided external capsule and lentiform nucleus infarction and hemorrhage.  EXAM: CT HEAD WITHOUT CONTRAST  TECHNIQUE: Contiguous axial images were obtained from the base of the skull through the vertex without intravenous contrast.  COMPARISON:  10/15/2014  FINDINGS: Involving and stable appearance to infarction involving the posterior left basal ganglia and extending to involve the posterior limb of the internal capsule and the adjacent external capsule. No new hemorrhage or mass effect identified. 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 No cortical infarction, hydrocephalus or extra-axial fluid  collection identified. 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  IMPRESSION: No change in appearance of head CT since the most recent study. 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 No new hemorrhage or infarction identified. Stable encephalocele into left sphenoid sinus.   Electronically Signed   By: Aletta Edouard M.D.   On: 10/17/2014 07:39    Review of Systems  Constitutional: Negative for malaise/fatigue.  Respiratory: Negative for shortness of breath.   Cardiovascular:  Negative for orthopnea and PND.  Gastrointestinal: Negative for nausea and vomiting.  Neurological: Positive for dizziness. Negative for headaches.   Blood pressure 193/88, pulse 87, temperature 97.6 F (36.4 C), temperature source Oral, resp. rate 21, height $RemoveBe'5\' 8"'[id number]$  (1.727 m), weight 64.411 kg (142 lb), SpO2 97 %. Physical Exam  Constitutional: She is oriented to person, place, and time. No distress.  Eyes: No scleral icterus.  Neck: No JVD present.  Cardiovascular: Normal rate and regular rhythm.   Respiratory: No respiratory distress. She has no wheezes.  GI: She exhibits no distension.  Musculoskeletal: She exhibits no edema.  Neurological: She is alert and oriented to person, place, and time.    Assessment/Plan: Problem #1 end-stage Disease: Patient presently denies any nausea or vomiting. Patient is due for dialysis. Problem #2 hypertension: Her blood pressure fluctuates a lot. She has been on labetalol, Procardia, hydralazine and clonidine as an outpatient. Presently patient doesn't remember whether she is taking her clonidine or not. The last time she took any of her blood pressure was last night. Patient also seems to have her that she anxiety  disorder where her blood pressure fluctuates a lot. Also the time her blood pressure is responsive to dialysis. Problem  #3 intracerebellar bleeding Problem #4 anemia: Her hemoglobin and hematocrit is below our target goal. Problem #5 metabolic bone disease: Her calcium is in range. Problem #6 history of gout We'll make arrangement for patient to get dialysis today. We'll give her first dose of hydralazine before dialysis as her systolic blood pressure is still high. We'll hold heparin during dialysis.  Burkley Dech S 10/17/2014, 4:18 PM

## 2014-10-17 NOTE — Procedures (Signed)
   HEMODIALYSIS TREATMENT NOTE:  3.5 hour heparin-free dialysis completed via right upper arm AVF (16g/antegrade).  Goal met:  2.8 liters removed; no interruption in ultrafiltration.  All blood reinfused. Hemostasis achieved in 12 minutes.  Minimal improvement in hypertension with ultrafiltration; PreHD BP 195/96, post-HD BP 182/90.  Report called to Kathe Becton, RN.  Aydin Cavalieri L. Shenaya Lebo, RN, CDN

## 2014-10-17 NOTE — Clinical Social Work Note (Signed)
CSW received consult. Discussed with RN who reports no CSW needs present at this time. Will sign off, but can be reconsulted if needed.  Benay Pike, [place]

## 2014-10-17 NOTE — ED Notes (Signed)
Spoke with Larry Sierras from [facility].  She requested notification of discharge.

## 2014-10-17 NOTE — ED Provider Notes (Signed)
Patient screened by me at the end of my shift. Orders placed. Patient feeling confused this morning. Patient just discharged from the hospital yesterday. Patient had dialysis done prior to discharge. Patient admitted on the 14th. Patient with long-standing hypertension problems. Patient without any focal deficit just states that she feels confused. No pain no shortness of breath. No headache.  Fredia Sorrow, MD 10/17/14 [phone]

## 2014-10-17 NOTE — Progress Notes (Signed)
Pt. is very confused. Pt refuses to take any medications. Md is called and notified about the situation. Pt's husband was called and updated about pt's condition. Pt's husband is currently on his way to the hospital.

## 2014-10-17 NOTE — ED Notes (Signed)
Pt states she got up this am approx 4 am in preparation for going to dialysis.  Pt states she was feeling a little disoriented and was having trouble getting her dentures in.  Pt's daughter states she just seemed a little confusesd

## 2014-10-17 NOTE — ED Provider Notes (Signed)
CSN: [id number]     Arrival date & time 10/17/14  0617 History   First MD Initiated Contact with Patient 10/17/14 0701     Chief Complaint  Patient presents with  . Altered Mental Status     (Consider location/radiation/quality/duration/timing/severity/associated sxs/prior Treatment) Patient is a 64 y.o. female presenting with altered mental status. The history is provided by the patient and a relative.  Altered Mental Status Presenting symptoms: confusion   Associated symptoms: headaches   Associated symptoms: no rash    patient was brought in for confusion. Discharge from the hospital yesterday after intracranial hemorrhage and hypertension. Had dialysis yesterday. Reportedly somewhat confused this morning. Could not figure out how to put her teeth in. States she feels kind of weak. She has a slight headache. She is able to recognize her daughter and no she is in the hospital but does not know the date. No chest pain. No numbness or weakness. Patient states she did not have memory issues when she was in the hospital.  Past Medical History  Diagnosis Date  . HTN (hypertension)   . Hypothyroidism   . Uterine cancer 2012  . Anemia   . CKD (chronic kidney disease)   . Dysphagia   . Hyperparathyroidism due to renal insufficiency   . GERD (gastroesophageal reflux disease)   . Diverticulitis    Past Surgical History  Procedure Laterality Date  . Cholecystectomy    . Cesarean section    . Laparoscopic total hysterectomy    . Colonoscopy  2000    TICS, [facility]  . Colonoscopy  2003 NUR BRBPR D50 V6    [place] TICS, [facility]  . Abdominal hysterectomy    . Nasal septum surgery    . Esophagogastroduodenoscopy (egd) with esophageal dilation N/A 06/08/2013    Dr. Fields:Stricture at the gastroesophageal junction/MILD NON-erosive gastritis (inflammation) was found in the gastric antrum; multiple biopsies/NO SOURCE FOR ANEMIA IDETIFIED-MOST LIKELY DUE TO ANEMIA OF CHRONIC DISEASE  . Colonoscopy N/A  09/04/2013    Procedure: COLONOSCOPY;  Surgeon: Danie Binder, MD;  Location: [facility];  Service: Endoscopy;  Laterality: N/A;  10:30AM-moved to 9:25 [name]  . Appendectomy    . Bascilic vein transposition Right 09/17/2013    Procedure: BRACHIAL VEIN TRANSPOSITION;  Surgeon: Conrad [place], MD;  Location: [place];  Service: Vascular;  Laterality: Right;  . Insertion of dialysis catheter Left 09/17/2013    Procedure: INSERTION OF DIALYSIS CATHETER;  Surgeon: Conrad [place], MD;  Location: [place];  Service: Vascular;  Laterality: Left;  . Bascilic vein transposition Right 10/29/2013    Procedure: RIGHT SECOND STAGE BRACHIAL VEIN TRANSPOSITION;  Surgeon: Conrad , MD;  Location: [facility] OR;  Service: Vascular;  Laterality: Right;  . Givens capsule study N/A 12/03/2013    Procedure: GIVENS CAPSULE STUDY;  Surgeon: Danie Binder, MD;  Location: [facility];  Service: Endoscopy;  Laterality: N/A;  7:30   Family History  Problem Relation Age of Onset  . Colon cancer Neg Hx   . Cancer Mother   . Diabetes Mother   . Hypertension Mother   . Diabetes Father   . Hypertension Father   . Hypertension Sister    History  Substance Use Topics  . Smoking status: Never Smoker   . Smokeless tobacco: Never Used  . Alcohol Use: No   OB History    No data available     Review of Systems  Constitutional: Positive for fatigue. Negative  for appetite change.  Respiratory: Negative for shortness of breath.   Cardiovascular: Negative for chest pain.  Genitourinary: Negative for flank pain.  Musculoskeletal: Negative for back pain and neck pain.  Skin: Negative for rash.  Neurological: Positive for headaches. Negative for speech difficulty and numbness.  Psychiatric/Behavioral: Positive for confusion.      Allergies  Cephalexin  Home Medications   Prior to Admission medications   Medication Sig Start Date End Date Taking? Authorizing Provider  allopurinol (ZYLOPRIM) 300 MG tablet Take  300 mg by mouth daily.    Historical Provider, MD  docusate sodium (COLACE) 100 MG capsule Take 100 mg by mouth 2 (two) times daily.    Historical Provider, MD  ferrous sulfate dried (SLOW FE) 160 (50 FE) MG TBCR SR tablet Take 640 mg by mouth daily.    Historical Provider, MD  hydrALAZINE (APRESOLINE) 100 MG tablet Take 100 mg by mouth 3 (three) times daily.  09/30/13   Historical Provider, MD  labetalol (NORMODYNE) 300 MG tablet Take 1 tablet (300 mg total) by mouth 3 (three) times daily. 08/20/13   Kinnie Feil, MD  levothyroxine (SYNTHROID, LEVOTHROID) 50 MCG tablet Take 1 tablet (50 mcg total) by mouth daily. 08/20/13   Kinnie Feil, MD  lidocaine-prilocaine (EMLA) cream Apply 1 application topically as needed (on Dialysis days Tues,Thurs.,Sat.).    Historical Provider, MD  Linaclotide (LINZESS) 290 MCG CAPS capsule 1 PO 30 MINS PRIOR TO BREAKFAST. IF O SATISFACTORY BM AFTER 2 WEEKS, TAKE WITH BREAKFAST. Patient taking differently: Take 290 mcg by mouth every morning.  05/01/14   Danie Binder, MD  meclizine (ANTIVERT) 25 MG tablet Take 25 mg by mouth 3 (three) times daily as needed for dizziness.    Historical Provider, MD  NIFEdipine (PROCARDIA) 20 MG capsule Take 60 mg by mouth 2 (two) times daily. 09/19/14   Historical Provider, MD  omeprazole (PRILOSEC) 20 MG capsule Take 1 capsule (20 mg total) by mouth daily. 07/29/14   Orvil Feil, NP  ondansetron (ZOFRAN) 4 MG tablet Take 4 mg by mouth every 8 (eight) hours as needed for nausea or vomiting.    Historical Provider, MD  oxyCODONE-acetaminophen (PERCOCET/ROXICET) 5-325 MG per tablet Take 1 tablet by mouth every 6 (six) hours as needed (pain).  09/17/13   Historical Provider, MD  sevelamer carbonate (RENVELA) 800 MG tablet Take 800-2,400 mg by mouth 3 (three) times daily with meals. Patient takes 1 tablet with snack and 3 tablets with meal    Historical Provider, MD  sodium bicarbonate 650 MG tablet Take 1 tablet (650 mg total) by mouth 2  (two) times daily. 06/12/13   Kathie Dike, MD   BP 207/101 mmHg  Pulse 87  Temp(Src) 97.6 F (36.4 C) (Oral)  Resp 18  Ht 5\' 8"  (1.727 m)  Wt 142 lb (64.411 kg)  BMI 21.60 kg/m2  SpO2 100% Physical Exam  ED Course  Procedures (including critical care time) Labs Review Labs Reviewed  CBC WITH DIFFERENTIAL/PLATELET - Abnormal; Notable for the following:    WBC 3.1 (*)    RBC 3.78 (*)    Hemoglobin 8.6 (*)    HCT 28.4 (*)    MCV 75.1 (*)    MCH 22.8 (*)    RDW 20.5 (*)    Neutro Abs 1.4 (*)    Monocytes Relative 17 (*)    All other components within normal limits  BASIC METABOLIC PANEL    Imaging Review Ct Head Wo Contrast  10/15/2014   CLINICAL DATA:  Abnormal MRI with dizziness and weakness for 1 month. Intracranial bleed.  EXAM: CT HEAD WITHOUT CONTRAST  TECHNIQUE: Contiguous axial images were obtained from the base of the skull through the vertex without intravenous contrast.  COMPARISON:  10/14/2014  FINDINGS: Skull and Sinuses:No acute findings.  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 Herniated temporal lobe is better seen on recent MRI imaging.  Orbits: No acute abnormality.  Brain: An isodense to hypodense hemorrhage in or below the posterior left putamen shows no visible enlargement. There is extensive chronic small vessel disease with confluent ischemic gliosis throughout the bilateral cerebral white matter and remote bilateral thalamic infarcts. Marked disease of the pons is better seen on previous MRI. No evidence of acute infarct, hydrocephalus, or shift.  IMPRESSION: 1. Stable appearance of subacute and isodense left basal ganglia hematoma. No acute hemorrhage. 2. Advanced chronic small vessel disease. 3. 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   Electronically Signed   By: Monte Fantasia M.D.   On:  10/15/2014 12:46     EKG Interpretation   Date/Time:  Thursday October 17 2014 [id number] EDT Ventricular Rate:  90 PR Interval:  150 QRS Duration: 88 QT Interval:  391 QTC Calculation: 478 R Axis:   82 Text Interpretation:  Sinus rhythm Biatrial enlargement Borderline right  axis deviation Left ventricular hypertrophy No significant change since  last tracing Confirmed by ZACKOWSKI  MD, SCOTT ([id number]) on 10/17/2014  6:45:59 AM      MDM   Final diagnoses:  Renal failure  Confusion    Patient with some confusion. Previous admission for head bleed. Due for dialysis today. Blood pressure is improved. Unknown blood pressure is cause. Will admit to internal medicine.    Davonna Belling, MD 10/18/14 [phone]

## 2014-10-18 LAB — BASIC METABOLIC PANEL
ANION GAP: 10 (ref 5–15)
BUN: 12 mg/dL (ref 6–23)
CO2: 29 mmol/L (ref 19–32)
CREATININE: 4.41 mg/dL — AB (ref 0.50–1.10)
Calcium: 9.4 mg/dL (ref 8.4–10.5)
Chloride: 98 mmol/L (ref 96–112)
GFR, EST AFRICAN AMERICAN: 11 mL/min — AB (ref >90)
GFR, EST NON AFRICAN AMERICAN: 10 mL/min — AB (ref >90)
Glucose, Bld: 100 mg/dL — ABNORMAL HIGH (ref 70–99)
Potassium: 4.4 mmol/L (ref 3.5–5.1)
Sodium: 137 mmol/L (ref 135–145)

## 2014-10-18 LAB — CBC
HEMATOCRIT: 30.9 % — AB (ref 36.0–46.0)
HEMOGLOBIN: 9.3 g/dL — AB (ref 12.0–15.0)
MCH: 22.2 pg — ABNORMAL LOW (ref 26.0–34.0)
MCHC: 30.1 g/dL (ref 30.0–36.0)
MCV: 73.9 fL — ABNORMAL LOW (ref 78.0–100.0)
Platelets: 177 10*3/uL (ref 150–400)
RBC: 4.18 MIL/uL (ref 3.87–5.11)
RDW: 19.9 % — ABNORMAL HIGH (ref 11.5–15.5)
WBC: 3.5 10*3/uL — AB (ref 4.0–10.5)

## 2014-10-18 LAB — GLUCOSE, CAPILLARY
Glucose-Capillary: 123 mg/dL — ABNORMAL HIGH (ref 70–99)
Glucose-Capillary: 136 mg/dL — ABNORMAL HIGH (ref 70–99)

## 2014-10-18 LAB — PHOSPHORUS: PHOSPHORUS: 3.3 mg/dL (ref 2.3–4.6)

## 2014-10-18 MED ORDER — LORAZEPAM 2 MG/ML IJ SOLN
0.5000 mg | Freq: Once | INTRAMUSCULAR | Status: AC
  Administered 2014-10-18: 0.5 mg via INTRAVENOUS
  Filled 2014-10-18: qty 1

## 2014-10-18 MED ORDER — ALTEPLASE 2 MG IJ SOLR
2.0000 mg | Freq: Once | INTRAMUSCULAR | Status: AC | PRN
  Filled 2014-10-18: qty 2

## 2014-10-18 MED ORDER — SODIUM CHLORIDE 0.9 % IV SOLN: 100.0000 mL | INTRAVENOUS | Status: DC | PRN

## 2014-10-18 MED ORDER — SODIUM CHLORIDE 0.9 % IV SOLN
INTRAVENOUS | Status: DC
  Administered 2014-10-18: via INTRAVENOUS

## 2014-10-18 MED ORDER — LORAZEPAM 0.5 MG PO TABS
1.0000 mg | ORAL_TABLET | Freq: Four times a day (QID) | ORAL | Status: DC | PRN
  Administered 2014-10-19 – 2014-10-20 (×3): 1 mg via ORAL
  Filled 2014-10-18 (×4): qty 2

## 2014-10-18 NOTE — Progress Notes (Signed)
Patient is still currently confused. Vital signs are stable. Family is at the bedside. Report was given to [facility] and April, RN.

## 2014-10-18 NOTE — Progress Notes (Signed)
Pt arrived via [facility]link with VSS and reporting no pain will continue to monitor. Ruben Gottron, [place]  10/18/2014 8:56 PM

## 2014-10-18 NOTE — Progress Notes (Signed)
Called to get report on pt and currently stable. [facility] on the way. Ruben Gottron, [place] 10/18/2014 7:52 PM

## 2014-10-18 NOTE — Care Management Note (Signed)
    Page 1 of 1   10/18/2014     3:14:05 PM CARE MANAGEMENT NOTE 10/18/2014  Patient:  Sonya [name], Sonya [name]   Account Number:  [id number]  Date Initiated:  10/18/2014  Documentation initiated by:  Jolene Provost  Subjective/Objective Assessment:   Pt lives at home with husband. Pt is on HD T/Th/Sa. Pt is active with [facility] for RN services. Family at bedside, not interested in "having this discussion" at this time. Family reports pt is being transfered to [facility].     Action/Plan:   CM at [facility] to continue following pt.   Anticipated DC Date:  10/21/2014   Anticipated DC Plan:  [place]  CM consult      [facility] Choice  Resumption Of Svcs/PTA Manasi Dishon   Choice offered to / List presented to:             Status of service:  In process, will continue to follow Medicare Important Message given?   (If response is "NO", the following Medicare IM given date fields will be blank) Date Medicare IM given:   Medicare IM given by:   Date Additional Medicare IM given:   Additional Medicare IM given by:    Discharge Disposition:    Per UR Regulation:  Reviewed for med. necessity/level of care/duration of stay  If discussed at [place] of Stay Meetings, dates discussed:    Comments:  10/18/2014 [place], RN, MSN, CM

## 2014-10-18 NOTE — Progress Notes (Signed)
Pt. continues to refuse the medication and blood pressure checks. Husband is at the bedside, daughter is on her way. MD was updated earlier.

## 2014-10-18 NOTE — Progress Notes (Signed)
Pt on bedside commode became unresponsive.  Pt was placed in bed trendelenburg position [place] equal reactive Dr Sarajane Jews came to bedside. Pt moving all extremities and is verbal.  Orders to monitor pt.

## 2014-10-18 NOTE — Progress Notes (Signed)
Subjective: Interval History: has no complaint of nausea or vomiting. Patient has this moment seems to be confused and consistently asking me why she is not working. Patient presently denies any difficulty breathing..  Objective: Vital signs in last 24 hours: Temp:  [97.7 F (36.5 C)-98.4 F (36.9 C)] 98.4 F (36.9 C) (03/18 0521) Pulse Rate:  [61-87] 69 (03/18 0600) Resp:  [13-26] 24 (03/18 0600) BP: (96-218)/(50-155) 135/66 mmHg (03/18 0600) SpO2:  [97 %-100 %] 100 % (03/18 0600) Weight:  [61 kg (134 lb 7.7 oz)-64.8 kg (142 lb 13.7 oz)] 62.4 kg (137 lb 9.1 oz) (03/18 0521) Weight change: 0.389 kg (13.7 oz)  Intake/Output from previous day: 03/17 0701 - 03/18 0700 In: 250 [I.V.:250] Out: 2800  Intake/Output this shift:    Patient is alert however seems to be confused and disoriented. Patient is repeatedly asking me why she is not working. At this moment she doesn't remember that she has dialysis yesterday. Patient is not also aware of that she is in the hospital. Chest: No rales rhonchi or egophony. Seems to be clear Heart regular rate and rhythm no murmur Abdomen: Soft positive bowel sounds Extremities no edema  Lab Results:  Recent Labs  10/17/14 0707  WBC 3.1*  HGB 8.6*  HCT 28.4*  PLT 175   BMET:  Recent Labs  10/17/14 0707  NA 136  K 3.9  CL 99  CO2 29  GLUCOSE 100*  BUN 18  CREATININE 6.96*  CALCIUM 9.3   No results for input(s): PTH in the last 72 hours. Iron Studies: No results for input(s): IRON, TIBC, TRANSFERRIN, FERRITIN in the last 72 hours.  Studies/Results: Dg Chest 2 View  10/17/2014   CLINICAL DATA:  Renal failure and confusion  EXAM: CHEST  2 VIEW  COMPARISON:  10/14/2014  FINDINGS: Mild cardiomegaly and aortic tortuosity which stable from prior. Trace pleural effusions. There is no edema, consolidation, or pneumothorax.  IMPRESSION: Trace pleural effusions.   Electronically Signed   By: Monte Fantasia M.D.   On: 10/17/2014 07:42   Ct  Head Wo Contrast  10/17/2014   CLINICAL DATA:  Dizziness and confusion. Recent left-sided external capsule and lentiform nucleus infarction and hemorrhage.  EXAM: CT HEAD WITHOUT CONTRAST  TECHNIQUE: Contiguous axial images were obtained from the base of the skull through the vertex without intravenous contrast.  COMPARISON:  10/15/2014  FINDINGS: Involving and stable appearance to infarction involving the posterior left basal ganglia and extending to involve the posterior limb of the internal capsule and the adjacent external capsule. No new hemorrhage or mass effect identified. 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 No cortical infarction, hydrocephalus or extra-axial fluid collection identified. 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  IMPRESSION: No change in appearance of head CT since the most recent study. 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 No new hemorrhage or infarction identified. Stable encephalocele into left sphenoid sinus.   Electronically Signed   By: Aletta Edouard M.D.   On: 10/17/2014 07:39    I have reviewed the patient's current medications.  Assessment/Plan: Problem #1 confusion: Presently seems to be alert but confused. Presently doesn't show any significant change. No sure whether this is because of the evolving left-sided white matter infarction. Problem #2 hypertension: Her blood pressure fluctuates but seems to be better. Problem #3 End-stage renal disease: She is status post hemodialysis yesterday. Her potassium  is normal. Problem #4 anemia: Her hemoglobin and hematocrit is below our target goal. Presently she is on Epogen. Problem #5 fluid management: Patient status post hemodialysis yesterday and was able to remove 2800 mL's. Presently no sign of fluid overload. Problem #6 metabolic bone disease: Her calcium  is in range. Plan: We'll continue with present management Patient will benefit from neurology evaluation. We'll make arrangements for patient to get dialysis tomorrow  LOS: 1 day   Malia Corsi S 10/18/2014,8:27 AM

## 2014-10-18 NOTE — Plan of Care (Signed)
Problem: Phase I Progression Outcomes Goal: Pain controlled with appropriate interventions Outcome: Not Applicable Date Met:  00/92/00 Denies pain Goal: OOB as tolerated unless otherwise ordered Outcome: Progressing Pt to BSC  Goal: Voiding-avoid urinary catheter unless indicated Outcome: Adequate for Discharge Dialysis

## 2014-10-18 NOTE — Progress Notes (Signed)
PROGRESS NOTE  Sonya YOUNIS [id number] DOB: 1950-08-29 DOA: 10/17/2014 PCP: [name], MD  Summary: 64 year old woman presented with "confusion", present when she woke up 3/17 in preparation for dialysis. Had difficulty putting her teeth and. Noted to have significantly high blood pressure was systolic in the [id number], treated with labetalol infusion. Per admitting physician's note systolic blood pressure greater than 180 was noted on previous hospitalization. Admitted for hypertensive emergency with acute encephalopathy. No confusion was noted by admitting physician, however nursing noted confusion overnight.  Discharge from the hospital 3/16 after being diagnosed with intracranial hemorrhage and hypertensive emergency. According to chart, neurosurgery recommended no intervention.  Outpatient follow-up was recommended.  Assessment/Plan: 1. Accelerated HTN/HTN emergency. Blood pressure control improved but remains confused. No focal neuro deficits. Not clear whether she has been compliant with medications. By chart review it appears that her blood pressure responds to hemodialysis. 2. Acute encephalopathy. Subacute. Present now more than 24 hours. Afebrile, no signs or symptoms to suggest infection. Does not make any urine. Chest x-ray without acute abnormalities. Suspect encephalopathy related to recent hemorrhage. 3. End-stage renal disease. Per nephrology. 4. Anemia of chronic disease. 5. Intracerebral bleed 3/14. 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 Per Dr. Jerilee Hoh: "Neurosurgery, Dr. Arnoldo Morale, reviewed MRI at that time and opined no surgical intervention was warranted and recommended repeat CT scan which was stable". Plan was to f/u with neurosurgeon Dr. Arnoldo Morale in 1 week. Repeat CT head on this admission was also noted to be stable. 6. 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 7. Moderately large  encephalocele and meningocele. Outpatient follow with neurosurgery planned. 8. Severe protein calorie malnutrition.    Patient without focal neurologic deficits but remains confused. A repeat CT had demonstrated no change in known hematoma/infarct. Family at bedside including 2 daughters, 2 nieces. Family requests/insists on transfer to [facility] for further evaluation and neurology evaluation, not available at [facility] over the weekend.  Patient appears stable for transfer to telemetry bed at [facility], neurology consult placed, d/w Dr. Janann Colonel who will see in consult.  In the meantime continue supportive care. SCDs. No anticoagulants or antiplatelet therapy secondary to above.  Soft diet for intermittent solid food dysphagia, further workup per GI as an outpaient.  Murray Hodgkins, MD  [facility]  Pager [phone] If 7PM-7AM, please contact night-coverage at [email or web address], password [facility] 10/18/2014, 10:17 AM  LOS: 1 day   Consultants:  Nephrology  Procedures:  Hemodialysis 3/17  Antibiotics:    HPI/Subjective: Tolerated hemodialysis well 3/17. Overnight the patient was noted to be confused by the nurse. Patient was refusing medications.  Patient confused. She denies any pain. Her history is somewhat suspect. No complaints. Multiple family members at bedside reports she is still confused.  Objective: Filed Vitals:   10/18/14 0500 10/18/14 0521 10/18/14 0600 10/18/14 0830  BP: 132/66  135/66   Pulse: 71  69   Temp:  98.4 F (36.9 C)  97.8 F (36.6 C)  TempSrc:  Axillary    Resp: 13  24   Height:      Weight:  62.4 kg (137 lb 9.1 oz)    SpO2: 99%  100%     Intake/Output Summary (Last 24 hours) at 10/18/14 1017 Last data filed at 10/17/14 2144  Gross per 24 hour  Intake    250 ml  Output   2800 ml  Net  -2550 ml     [employer]  10/17/14 1805 10/17/14 2158 10/18/14 0521  Weight: 64.8 kg (142 lb 13.7 oz) 61 kg (134 lb 7.7 oz) 62.4 kg (137 lb 9.1 oz)     Exam:     Afebrile. Blood pressure now 130s/60s but quite labile ranging down below 1:30, up to 160s. General:  Appears calm and comfortable; appears confused. Nontoxic. Eyes: PERRL, normal lids, irises  ENT: grossly normal hearing, lips & tongue Cardiovascular: RRR, no m/r/g. No LE edema. Telemetry: SR, no arrhythmias  Respiratory: CTA bilaterally, no w/r/r. Normal respiratory effort. Abdomen: soft, ntnd Skin: no rash or induration seen  Musculoskeletal: grossly normal tone BUE/BLE; exam limited only follows commands intermittently, able to lift both arms and legs. Psychiatric: Confused. Oriented to location but not month or year. Speech is intelligible, fluent but not always appropriate. Neurologic: Exam limited by confusion but cranial nerves appear intact. No definite pronator drift. No focal neuro deficits noted.  Data Reviewed:  Basic metabolic panel consistent with end-stage renal disease.  Hemoglobin stable 9.3   Chest x-ray: trace effusions  CT head 3/17: No change in appearance of head CT, stable evolving left-sided white matter infarction involving the basal ganglia, posterior limb of the internal capsule and external capsule. No new hemorrhage or infarction identified.  EKG sinus rhythm, normal QT.   Scheduled Meds: . allopurinol  300 mg Oral Daily  . docusate sodium  100 mg Oral BID  . hydrALAZINE  100 mg Oral TID  . labetalol  300 mg Oral TID  . labetalol  5 mg Intravenous Once  . levothyroxine  50 mcg Oral QAC breakfast  . Linaclotide  290 mcg Oral q morning - 10a  . NIFEdipine  60 mg Oral BID  . pantoprazole  40 mg Oral Daily  . sevelamer carbonate  2,400 mg Oral TID WC  . sodium bicarbonate  650 mg Oral BID  . sodium chloride  3 mL Intravenous Q12H   Continuous Infusions:   Principal Problem:   Hypertensive emergency Active Problems:   HTN (hypertension)   End stage renal disease   Intracerebral bleed   Protein-calorie malnutrition,  severe   Time spent 45 minutes, greater than 50% counseling and coordination of care

## 2014-10-19 ENCOUNTER — Inpatient Hospital Stay (HOSPITAL_COMMUNITY): Payer: Medicare Other

## 2014-10-19 DIAGNOSIS — I618 Other nontraumatic intracerebral hemorrhage: Secondary | ICD-10-CM

## 2014-10-19 DIAGNOSIS — R410 Disorientation, unspecified: Secondary | ICD-10-CM

## 2014-10-19 LAB — COMPREHENSIVE METABOLIC PANEL
ALT: 10 U/L (ref 0–35)
AST: 13 U/L (ref 0–37)
Albumin: 3.2 g/dL — ABNORMAL LOW (ref 3.5–5.2)
Alkaline Phosphatase: 31 U/L — ABNORMAL LOW (ref 39–117)
Anion gap: 9 (ref 5–15)
BUN: 19 mg/dL (ref 6–23)
CALCIUM: 9.6 mg/dL (ref 8.4–10.5)
CO2: 28 mmol/L (ref 19–32)
Chloride: 96 mmol/L (ref 96–112)
Creatinine, Ser: 7.37 mg/dL — ABNORMAL HIGH (ref 0.50–1.10)
GFR calc non Af Amer: 5 mL/min — ABNORMAL LOW (ref >90)
GFR, EST AFRICAN AMERICAN: 6 mL/min — AB (ref >90)
GLUCOSE: 82 mg/dL (ref 70–99)
Potassium: 4.1 mmol/L (ref 3.5–5.1)
Sodium: 133 mmol/L — ABNORMAL LOW (ref 135–145)
Total Bilirubin: 0.6 mg/dL (ref 0.3–1.2)
Total Protein: 6.2 g/dL (ref 6.0–8.3)

## 2014-10-19 LAB — URINALYSIS, ROUTINE W REFLEX MICROSCOPIC
Bilirubin Urine: NEGATIVE
GLUCOSE, UA: NEGATIVE mg/dL
Hgb urine dipstick: NEGATIVE
Ketones, ur: NEGATIVE mg/dL
NITRITE: NEGATIVE
Protein, ur: >300 mg/dL — AB
Specific Gravity, Urine: 1.016 (ref 1.005–1.030)
UROBILINOGEN UA: 0.2 mg/dL (ref 0.0–1.0)
pH: 8 (ref 5.0–8.0)

## 2014-10-19 LAB — CBC
HCT: 27 % — ABNORMAL LOW (ref 36.0–46.0)
Hemoglobin: 8.3 g/dL — ABNORMAL LOW (ref 12.0–15.0)
MCH: 22.7 pg — ABNORMAL LOW (ref 26.0–34.0)
MCHC: 30.7 g/dL (ref 30.0–36.0)
MCV: 73.8 fL — ABNORMAL LOW (ref 78.0–100.0)
PLATELETS: 178 10*3/uL (ref 150–400)
RBC: 3.66 MIL/uL — ABNORMAL LOW (ref 3.87–5.11)
RDW: 20.3 % — ABNORMAL HIGH (ref 11.5–15.5)
WBC: 3.6 10*3/uL — ABNORMAL LOW (ref 4.0–10.5)

## 2014-10-19 LAB — PHOSPHORUS: Phosphorus: 5.1 mg/dL — ABNORMAL HIGH (ref 2.3–4.6)

## 2014-10-19 LAB — URINE MICROSCOPIC-ADD ON

## 2014-10-19 LAB — GLUCOSE, CAPILLARY: Glucose-Capillary: 87 mg/dL (ref 70–99)

## 2014-10-19 MED ORDER — LIDOCAINE HCL (PF) 1 % IJ SOLN: 5.0000 mL | INTRAMUSCULAR | Status: DC | PRN

## 2014-10-19 MED ORDER — SODIUM CHLORIDE 0.9 % IV SOLN: 62.5000 mg | INTRAVENOUS | Status: DC

## 2014-10-19 MED ORDER — ALTEPLASE 2 MG IJ SOLR
2.0000 mg | Freq: Once | INTRAMUSCULAR | Status: DC | PRN
  Filled 2014-10-19: qty 2

## 2014-10-19 MED ORDER — LORAZEPAM BOLUS VIA INFUSION: 0.5000 mg | INTRAVENOUS | Status: DC | PRN

## 2014-10-19 MED ORDER — HYDRALAZINE HCL 50 MG PO TABS
50.0000 mg | ORAL_TABLET | Freq: Three times a day (TID) | ORAL | Status: DC
  Administered 2014-10-19 – 2014-10-20 (×4): 50 mg via ORAL
  Filled 2014-10-19 (×4): qty 1

## 2014-10-19 MED ORDER — HYDRALAZINE HCL 20 MG/ML IJ SOLN: 10.0000 mg | Freq: Four times a day (QID) | INTRAMUSCULAR | Status: DC | PRN

## 2014-10-19 MED ORDER — LABETALOL HCL 100 MG PO TABS
300.0000 mg | ORAL_TABLET | Freq: Two times a day (BID) | ORAL | Status: DC
  Administered 2014-10-19 – 2014-10-20 (×2): 300 mg via ORAL
  Filled 2014-10-19 (×8): qty 1

## 2014-10-19 MED ORDER — DARBEPOETIN ALFA 100 MCG/0.5ML IJ SOSY
100.0000 ug | PREFILLED_SYRINGE | INTRAMUSCULAR | Status: DC
  Administered 2014-10-22: 100 ug via INTRAVENOUS
  Filled 2014-10-19: qty 0.5

## 2014-10-19 MED ORDER — HEPARIN SODIUM (PORCINE) 1000 UNIT/ML DIALYSIS
1000.0000 [IU] | INTRAMUSCULAR | Status: DC | PRN
  Filled 2014-10-19: qty 1

## 2014-10-19 MED ORDER — SODIUM CHLORIDE 0.9 % IV SOLN: 100.0000 mL | INTRAVENOUS | Status: DC | PRN

## 2014-10-19 MED ORDER — LORAZEPAM 2 MG/ML IJ SOLN
0.5000 mg | INTRAMUSCULAR | Status: DC | PRN
  Administered 2014-10-19: 0.5 mg via INTRAVENOUS

## 2014-10-19 MED ORDER — PENTAFLUOROPROP-TETRAFLUOROETH EX AERO: 1.0000 "application " | INHALATION_SPRAY | CUTANEOUS | Status: DC | PRN

## 2014-10-19 MED ORDER — HYDRALAZINE HCL 10 MG PO TABS
10.0000 mg | ORAL_TABLET | Freq: Once | ORAL | Status: AC
  Administered 2014-10-19: 10 mg via ORAL
  Filled 2014-10-19: qty 1

## 2014-10-19 MED ORDER — LIDOCAINE-PRILOCAINE 2.5-2.5 % EX CREA
1.0000 "application " | TOPICAL_CREAM | CUTANEOUS | Status: DC | PRN
  Filled 2014-10-19: qty 5

## 2014-10-19 MED ORDER — LORAZEPAM 2 MG/ML IJ SOLN
INTRAMUSCULAR | Status: AC
  Administered 2014-10-19: 0.5 mg via INTRAVENOUS
  Filled 2014-10-19: qty 1

## 2014-10-19 MED ORDER — LABETALOL HCL 5 MG/ML IV SOLN
10.0000 mg | INTRAVENOUS | Status: DC | PRN
  Filled 2014-10-19: qty 4

## 2014-10-19 MED ORDER — NEPRO/CARBSTEADY PO LIQD: 237.0000 mL | ORAL | Status: DC | PRN

## 2014-10-19 MED ORDER — RENA-VITE PO TABS
1.0000 | ORAL_TABLET | Freq: Every day | ORAL | Status: DC
  Administered 2014-10-20 – 2014-10-22 (×3): 1 via ORAL
  Filled 2014-10-19 (×4): qty 1

## 2014-10-19 MED ORDER — DOXERCALCIFEROL 4 MCG/2ML IV SOLN
5.0000 ug | INTRAVENOUS | Status: DC
  Administered 2014-10-22: 5 ug via INTRAVENOUS
  Filled 2014-10-19: qty 4

## 2014-10-19 NOTE — Consult Note (Signed)
Reason for Consult:Confusion Referring Physician: Sarajane Jews  CC: Confusion  HPI: CHARRYSE RABANAL is an 64 y.o. female who is unable to provide any history.  History obtained from hart and from family.  It seems that the patient was admitted to [facility] on 3/14 with dizziness.  Patient was hypertensive.  Work up at that time included a head CT that showed a small left external capsule hemorrhage.  Neurosurgery was consulted and did not feel that intervention was required.  Was felt to be related to hypertension.  Patient remained stable and was discharged.  The patient returned on 3/17 after awakening confused.  BP again was elevated.  Repeat head CT was unchanged.  Consult called for further recommendations.  Past Medical History  Diagnosis Date  . HTN (hypertension)   . Hypothyroidism   . Uterine cancer 2012  . Anemia   . CKD (chronic kidney disease)   . Dysphagia   . Hyperparathyroidism due to renal insufficiency   . GERD (gastroesophageal reflux disease)   . Diverticulitis     Past Surgical History  Procedure Laterality Date  . Cholecystectomy    . Cesarean section    . Laparoscopic total hysterectomy    . Colonoscopy  2000    [facility], [facility]  . Colonoscopy  2003 NUR BRBPR D50 V6    [facility] [facility], [facility]  . Abdominal hysterectomy    . Nasal septum surgery    . Esophagogastroduodenoscopy (egd) with esophageal dilation N/A 06/08/2013    Dr. Fields:Stricture at the gastroesophageal junction/MILD NON-erosive gastritis (inflammation) was found in the gastric antrum; multiple biopsies/NO SOURCE FOR ANEMIA IDETIFIED-MOST LIKELY DUE TO ANEMIA OF CHRONIC DISEASE  . Colonoscopy N/A 09/04/2013    Procedure: COLONOSCOPY;  Surgeon: Danie Binder, MD;  Location: [facility];  Service: Endoscopy;  Laterality: N/A;  10:30AM-moved to 9:25 [name]  . Appendectomy    . Bascilic vein transposition Right 09/17/2013    Procedure: BRACHIAL VEIN TRANSPOSITION;  Surgeon: Conrad Vincent, MD;  Location: [facility];   Service: Vascular;  Laterality: Right;  . Insertion of dialysis catheter Left 09/17/2013    Procedure: INSERTION OF DIALYSIS CATHETER;  Surgeon: Conrad Owaneco, MD;  Location: [facility];  Service: Vascular;  Laterality: Left;  . Bascilic vein transposition Right 10/29/2013    Procedure: RIGHT SECOND STAGE BRACHIAL VEIN TRANSPOSITION;  Surgeon: Conrad Britton, MD;  Location: [facility] OR;  Service: Vascular;  Laterality: Right;  . Givens capsule study N/A 12/03/2013    Procedure: GIVENS CAPSULE STUDY;  Surgeon: Danie Binder, MD;  Location: [facility];  Service: Endoscopy;  Laterality: N/A;  7:30    Family History  Problem Relation Age of Onset  . Colon cancer Neg Hx   . Cancer Mother   . Diabetes Mother   . Hypertension Mother   . Diabetes Father   . Hypertension Father   . Hypertension Sister     Social History:  reports that she has never smoked. She has never used smokeless tobacco. She reports that she does not drink alcohol or use illicit drugs.  Allergies  Allergen Reactions  . Cephalexin Itching    Medications:  I have reviewed the patient's current medications. Prior to Admission:  Prescriptions prior to admission  Medication Sig Dispense Refill Last Dose  . allopurinol (ZYLOPRIM) 300 MG tablet Take 300 mg by mouth daily.   10/16/2014 at Unknown time  . docusate sodium (COLACE) 100 MG capsule Take 100 mg by mouth  2 (two) times daily.   10/16/2014 at Unknown time  . ferrous sulfate dried (SLOW FE) 160 (50 FE) MG TBCR SR tablet Take 640 mg by mouth daily.   10/16/2014 at Unknown time  . hydrALAZINE (APRESOLINE) 100 MG tablet Take 100 mg by mouth 3 (three) times daily.    10/16/2014 at Unknown time  . labetalol (NORMODYNE) 300 MG tablet Take 1 tablet (300 mg total) by mouth 3 (three) times daily. 90 tablet 1 10/16/2014 at Unknown time  . levothyroxine (SYNTHROID, LEVOTHROID) 50 MCG tablet Take 1 tablet (50 mcg total) by mouth daily. 30 tablet 2 10/16/2014 at Unknown time  . lidocaine-prilocaine  (EMLA) cream Apply 1 application topically as needed (on Dialysis days Tues,Thurs.,Sat.).   Past Week at Unknown time  . Linaclotide (LINZESS) 290 MCG CAPS capsule 1 PO 30 MINS PRIOR TO BREAKFAST. IF O SATISFACTORY BM AFTER 2 WEEKS, TAKE WITH BREAKFAST. (Patient taking differently: Take 290 mcg by mouth every morning. ) 30 capsule 11 10/16/2014 at Unknown time  . NIFEdipine (PROCARDIA) 20 MG capsule Take 60 mg by mouth 2 (two) times daily.   10/16/2014 at Unknown time  . omeprazole (PRILOSEC) 20 MG capsule Take 1 capsule (20 mg total) by mouth daily. 31 capsule 5 10/16/2014 at Unknown time  . oxyCODONE-acetaminophen (PERCOCET/ROXICET) 5-325 MG per tablet Take 1 tablet by mouth every 6 (six) hours as needed (pain).    Past Week at Unknown time  . sevelamer carbonate (RENVELA) 800 MG tablet Take 800-2,400 mg by mouth 3 (three) times daily with meals. Patient takes 1 tablet with snack and 3 tablets with meal   10/16/2014 at Unknown time  . sodium bicarbonate 650 MG tablet Take 1 tablet (650 mg total) by mouth 2 (two) times daily. 60 tablet 1 10/16/2014 at Unknown time   Scheduled: . allopurinol  300 mg Oral Daily  . docusate sodium  100 mg Oral BID  . levothyroxine  50 mcg Oral QAC breakfast  . Linaclotide  290 mcg Oral q morning - 10a  . pantoprazole  40 mg Oral Daily  . sevelamer carbonate  2,400 mg Oral TID WC  . sodium bicarbonate  650 mg Oral BID    ROS: Unable to provide  Physical Examination: Blood pressure 175/71, pulse 83, temperature 98.1 F (36.7 C), temperature source Oral, resp. rate 12, height 5\' 8"  (1.727 m), weight 66.271 kg (146 lb 1.6 oz), SpO2 100 %.  HEENT-  Normocephalic, no lesions, without obvious abnormality.  Normal external eye and conjunctiva.  Normal TM's bilaterally.  Normal auditory canals and external ears. Normal external nose, mucus membranes and septum.  Normal pharynx. Cardiovascular- S1, S2 normal, pulses palpable throughout   Lungs- chest clear, no wheezing,  rales, normal symmetric air entry Abdomen- soft, non-tender; bowel sounds normal; no masses,  no organomegaly Extremities- no edema Lymph-no adenopathy palpable Musculoskeletal-no joint tenderness, deformity or swelling Skin-warm and dry, no hyperpigmentation, vitiligo, or suspicious lesions  Neurological Examination Mental Status: Lethargic.  Knows she is in the hospital but still thinks she is in [facility].  Reports it is 2006.  Does not know the month or the day.  Unable to perform 3-step commands without hand over hand instruction.  For example  When asked to do finger to nose she just reached for a cup of water, oriented.  Speech fluent without evidence of aphasia.  Cranial Nerves: II: Discs flat bilaterally; Visual fields grossly normal, pupils equal, round, reactive to light and accommodation III,IV, VI: ptosis not  present, extra-ocular motions intact bilaterally V,VII: smile symmetric, facial light touch sensation normal bilaterally VIII: hearing normal bilaterally IX,X: gag reflex present XI: bilateral shoulder shrug XII: midline tongue extension Motor: Right : Upper extremity   5/5    Left:     Upper extremity   5/5  Lower extremity   5/5     Lower extremity   5/5 Tone and bulk:normal tone throughout; no atrophy noted Sensory: Pinprick and light touch intact throughout, bilaterally Deep Tendon Reflexes: 2+ and symmetric throughout Plantars: Right: downgoing   Left: downgoing Cerebellar: normal finger-to-nose and normal heel-to-shin testing bilaterally Gait: unable to test due to safety concerns.  Patient with a syncopal event today with standing.        Laboratory Studies:   Basic Metabolic Panel:  Recent Labs Lab 10/14/14 1608 10/15/14 0253 10/15/14 0254 10/17/14 0707 10/18/14 0829 10/18/14 0839  NA 137 138  --  136 137  --   K 3.0* 3.0*  --  3.9 4.4  --   CL 100 101  --  99 98  --   CO2 28 27  --  29 29  --   GLUCOSE 106* 95  --  100* 100*  --   BUN 21 23  --  18  12  --   CREATININE 7.56* 8.01*  --  6.96* 4.41*  --   CALCIUM 9.5 8.8  --  9.3 9.4  --   MG  --   --  1.8  --   --   --   PHOS  --   --   --   --   --  3.3    Liver Function Tests:  Recent Labs Lab 10/14/14 1608 10/15/14 0253  AST 16 12  ALT 9 7  ALKPHOS 30* 28*  BILITOT 0.5 0.4  PROT 6.9 5.8*  ALBUMIN 3.5 2.9*   No results for input(s): LIPASE, AMYLASE in the last 168 hours. No results for input(s): AMMONIA in the last 168 hours.  CBC:  Recent Labs Lab 10/14/14 1608 10/15/14 0253 10/17/14 0707 10/18/14 0829  WBC 3.9* 4.0 3.1* 3.5*  NEUTROABS 2.0  --  1.4*  --   HGB 8.6* 7.9* 8.6* 9.3*  HCT 29.1* 25.5* 28.4* 30.9*  MCV 76.0* 74.8* 75.1* 73.9*  PLT 191 176 175 177    Cardiac Enzymes: No results for input(s): CKTOTAL, CKMB, CKMBINDEX, TROPONINI in the last 168 hours.  BNP: Invalid input(s): POCBNP  CBG:  Recent Labs Lab 10/18/14 0419 10/18/14 1239  GLUCAP 123* 136*    Microbiology: Results for orders placed or performed during the hospital encounter of 10/17/14  MRSA PCR Screening     Status: None   Collection Time: 10/17/14 10:50 AM  Result Value Ref Range Status   MRSA by PCR NEGATIVE NEGATIVE Final    Comment:        The GeneXpert MRSA Assay (FDA approved for NASAL specimens only), is one component of a comprehensive MRSA colonization surveillance program. It is not intended to diagnose MRSA infection nor to guide or monitor treatment for MRSA infections.     Coagulation Studies: No results for input(s): LABPROT, INR in the last 72 hours.  Urinalysis: No results for input(s): COLORURINE, LABSPEC, PHURINE, GLUCOSEU, HGBUR, BILIRUBINUR, KETONESUR, PROTEINUR, UROBILINOGEN, NITRITE, LEUKOCYTESUR in the last 168 hours.  Invalid input(s): APPERANCEUR  Lipid Panel:  No results found for: CHOL, TRIG, HDL, CHOLHDL, VLDL, LDLCALC  HgbA1C: No results found for: HGBA1C  Urine Drug Screen:  No results found for: LABOPIA, COCAINSCRNUR, LABBENZ,  AMPHETMU, THCU, LABBARB  Alcohol Level: No results for input(s): ETH in the last 168 hours.  Other results: EKG: sinus rhythm at 90 bpm.  Imaging: Dg Chest 2 View  10/17/2014   CLINICAL DATA:  Renal failure and confusion  EXAM: CHEST  2 VIEW  COMPARISON:  10/14/2014  FINDINGS: Mild cardiomegaly and aortic tortuosity which stable from prior. Trace pleural effusions. There is no edema, consolidation, or pneumothorax.  IMPRESSION: Trace pleural effusions.   Electronically Signed   By: Monte Fantasia M.D.   On: 10/17/2014 07:42   Ct Head Wo Contrast  10/17/2014   CLINICAL DATA:  Dizziness and confusion. Recent left-sided external capsule and lentiform nucleus infarction and hemorrhage.  EXAM: CT HEAD WITHOUT CONTRAST  TECHNIQUE: Contiguous axial images were obtained from the base of the skull through the vertex without intravenous contrast.  COMPARISON:  10/15/2014  FINDINGS: Involving and stable appearance to infarction involving the posterior left basal ganglia and extending to involve the posterior limb of the internal capsule and the adjacent external capsule. No new hemorrhage or mass effect identified. 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 No cortical infarction, hydrocephalus or extra-axial fluid collection identified. 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  IMPRESSION: No change in appearance of head CT since the most recent study. 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 No new hemorrhage or infarction identified. Stable encephalocele into left sphenoid sinus.   Electronically Signed   By: Aletta Edouard M.D.   On: 10/17/2014 07:39     Assessment/Plan: 64 year old female with altered mental status.  Etiology unclear.  Neurological examination is non-focal.  Patient with known external capsule hemorrhage.  Films personally  reviewed.  This hemorrhage is small and not likely the cause of the mental status changes. Lab work has been unrevealing.  No evidence of infection.  BP has remained significantly elevated and has just gotten within the normal range today.  A hypertensive encephalopathy therefore still remains on the differential.    Recommendations: 1.  BP control with target SBP less than 160 2.  EEG 3.  MRI of the brain withuot contrast to be repeated.   4.  TSH  Alexis Goodell, MD [facility] [phone] 10/19/2014, 1:00 AM

## 2014-10-19 NOTE — Progress Notes (Signed)
PROGRESS NOTE  Sonya [name] [id number] DOB: 05/12/1951 DOA: 10/17/2014 PCP: [name], MD  Summary: 64 year old woman presented with "confusion", present when she woke up 3/17 in preparation for dialysis. Had difficulty putting her teeth and. Noted to have significantly high blood pressure was systolic in the 123456, treated with labetalol infusion. Per admitting physician's note systolic blood pressure greater than 180 was noted on previous hospitalization. Admitted for hypertensive emergency with acute encephalopathy. No confusion was noted by admitting physician, however nursing noted confusion overnight.  Discharge from the hospital 3/16 after being diagnosed with intracranial hemorrhage and hypertensive emergency. According to chart, neurosurgery recommended no intervention.  Outpatient follow-up was recommended.  Assessment/Plan: 1. Accelerated HTN/HTN  Restart hydralazine , labetalol, HD should improve papameters. No focal neuro deficits.  By chart review it appears that her blood pressure responds to hemodialysis. 2. Acute encephalopathy. Remains confused , abnl EEG, waiting further neuro recommendations. Subacute .  Afebrile, no signs or symptoms to suggest infection. UA negative.. Chest x-ray without acute abnormalities. Suspect encephalopathy related to recent hemorrhage. 3. End-stage renal disease. Consulted Dr Deterding ,TTS at [place]  4. Anemia of chronic disease. 5. Intracerebral bleed 3/14. 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 Per Dr. Jerilee Hoh: "Neurosurgery, Dr. Arnoldo Morale, reviewed MRI at that time and opined no surgical intervention was warranted and recommended repeat CT scan which was stable". Plan was to f/u with neurosurgeon Dr. Arnoldo Morale in 1 week.  Repeat MRI stable  6. 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 7. Moderately large encephalocele and meningocele. Outpatient follow with  neurosurgery planned. 8. Severe protein calorie malnutrition.     . Family at bedside including 2 daughters, 2 nieces. Family requests/insists on transfer to [facility] for further evaluation and neurology evaluation, not available at [facility] over the weekend.  Patient appears stable for transfer to telemetry bed at [facility], neurology consult placed, d/w Dr. Janann Colonel who will see in consult.  In the meantime continue supportive care. SCDs. No anticoagulants or antiplatelet therapy secondary to above.  Soft diet for intermittent solid food dysphagia, further workup per GI as an outpaient.  Reyne Dumas , MD  [facility]  Pager [phone] If 7PM-7AM, please contact night-coverage at [email or web address], password [facility] 10/19/2014, 12:24 PM  LOS: 2 days   Consultants:  Nephrology  Procedures:  Hemodialysis 3/17  Antibiotics:    HPI/Subjective:    Patient confused. She denies any pain. Her history is somewhat suspect. No complaints. Multiple family members at bedside reports she is still confused.  Objective: Filed Vitals:   10/19/14 0303 10/19/14 0441 10/19/14 0602 10/19/14 1119  BP: 199/73 156/114 196/76 196/96  Pulse: 82  78 78  Temp: 98.3 F (36.8 C)  98.3 F (36.8 C)   TempSrc: Oral  Oral   Resp: 12  14 16   Height:      Weight:      SpO2: 100%  100% 100%   No intake or output data in the 24 hours ending 10/19/14 1224   Filed Weights   10/17/14 2158 10/18/14 0521 10/18/14 2054  Weight: 61 kg (134 lb 7.7 oz) 62.4 kg (137 lb 9.1 oz) 66.271 kg (146 lb 1.6 oz)    Exam:     Afebrile. Blood pressure now 130s/60s but quite labile ranging down below 1:30, up to 160s. General:  Appears calm and comfortable; appears confused. Nontoxic. Eyes: PERRL, normal lids, irises  ENT: grossly normal hearing, lips & tongue  Cardiovascular: RRR, no m/r/g. No LE edema. Telemetry: SR, no arrhythmias  Respiratory: CTA bilaterally, no w/r/r. Normal respiratory effort. Abdomen: soft,  ntnd Skin: no rash or induration seen  Musculoskeletal: grossly normal tone BUE/BLE; exam limited only follows commands intermittently, able to lift both arms and legs. Psychiatric: Confused. Oriented to location but not month or year. Speech is intelligible, fluent but not always appropriate. Neurologic: Exam limited by confusion but cranial nerves appear intact. No definite pronator drift. No focal neuro deficits noted.  Data Reviewed:  Basic metabolic panel consistent with end-stage renal disease.  Hemoglobin stable 9.3   Chest x-ray: trace effusions  CT head 3/17: No change in appearance of head CT, stable evolving left-sided white matter infarction involving the basal ganglia, posterior limb of the internal capsule and external capsule. No new hemorrhage or infarction identified.  EKG sinus rhythm, normal QT.   Scheduled Meds: . allopurinol  300 mg Oral Daily  . docusate sodium  100 mg Oral BID  . hydrALAZINE  50 mg Oral 3 times per day  . labetalol  300 mg Oral BID  . levothyroxine  50 mcg Oral QAC breakfast  . Linaclotide  290 mcg Oral q morning - 10a  . pantoprazole  40 mg Oral Daily  . sevelamer carbonate  2,400 mg Oral TID WC  . sodium bicarbonate  650 mg Oral BID   Continuous Infusions:   Principal Problem:   Hypertensive emergency Active Problems:   HTN (hypertension)   End stage renal disease   Intracerebral bleed   Protein-calorie malnutrition, severe   Confusion   Time spent 45 minutes, greater than 50% counseling and coordination of care

## 2014-10-19 NOTE — Progress Notes (Signed)
EEG Completed; Results Pending  

## 2014-10-19 NOTE — Procedures (Addendum)
ELECTROENCEPHALOGRAM REPORT   Patient: Sonya [name]       Room #: 4N-20 EEG No. ID:  Age: 64 y.o.        Sex: female Referring Physician: Dr Doy Mince Report Date:  10/19/2014        Interpreting Physician: Hulen Luster  History: [name] is an 64 y.o. female s/p L [facility] admitted with altered mental status  Medications:  Scheduled: . allopurinol  300 mg Oral Daily  . docusate sodium  100 mg Oral BID  . hydrALAZINE  50 mg Oral 3 times per day  . labetalol  300 mg Oral BID  . levothyroxine  50 mcg Oral QAC breakfast  . Linaclotide  290 mcg Oral q morning - 10a  . pantoprazole  40 mg Oral Daily  . sevelamer carbonate  2,400 mg Oral TID WC  . sodium bicarbonate  650 mg Oral BID    Conditions of Recording:  This is a 16 channel EEG carried out with the patient in the drowsy state.  Description:  The waking background activity consists of a low voltage, symmetrical,poorly organized theta activity predominantly in the 6 to 7 Hz range. There are brief periods of poorly sustained alpha activity in the posterior region. Normal sleep architecture is not observed.  There are periods of intermittent left temporal delta activity. In addition there are a few noted left temporal sharp waves phase reversing at T3.   Photic stimulation and hyperventilation were not performed.   IMPRESSION: Abnormal EEG due to generalized slowing indicating a mild to moderate cerebral disturbance (encephalopathy). In addition there is intermittent focal slowing and sharp wave activity in the left temporal region indicating a potential focal abnormality.    Jim Like, DO [facility] [phone]  If 7pm- 7am, please page neurology on call as listed in [place]. 10/19/2014, 11:43 AM

## 2014-10-19 NOTE — Consult Note (Signed)
[facility] Renal Consultation Note  Indication for Consultation:  Management of ESRD/hemodialysis; anemia, hypertension/volume and secondary hyperparathyroidism  HPI: Sonya [name] is a 64 y.o. female  With ESRD presumed sec to HTN/"on HD one year"  HD TTS/  Followed By Dr. Candice Camp  admitted with confusion/ hypertensive in setting of recent [facility] admit 3/14- 10/16/14 intracranial hemorrhage and hypertension with Neurosurgery  consulted and felt no  intervention was required=" related to hypertension"/ she remained stable and was discharged home. 10/17/14  Family brought back to [facility] ER with confusion  Pt moved to [facility]  As Family requesting  and Neurology Consulted at [facility] and we are called for Hemodialysis/Nephrology support. She has some  mild agitation when Dr. Jimmy Footman was interviewing her history with her.She now realizes she is at [facility] and had some tests to check out her confusion."Do you think my mind is going crazy". Husband in room and dw him need for HD today . Last HD was Thursday 10/17/14 at [facility]      Past Medical History  Diagnosis Date  . HTN (hypertension)   . Hypothyroidism   . Uterine cancer 2012  . Anemia   . CKD (chronic kidney disease)   . Dysphagia   . Hyperparathyroidism due to renal insufficiency   . GERD (gastroesophageal reflux disease)   . Diverticulitis     Past Surgical History  Procedure Laterality Date  . Cholecystectomy    . Cesarean section    . Laparoscopic total hysterectomy    . Colonoscopy  2000    TICS, IH  . Colonoscopy  2003 NUR BRBPR D50 V6    DC/[facility] TICS, IH  . Abdominal hysterectomy    . Nasal septum surgery    . Esophagogastroduodenoscopy (egd) with esophageal dilation N/A 06/08/2013    Dr. Fields:Stricture at the gastroesophageal junction/MILD NON-erosive gastritis (inflammation) was found in the gastric antrum; multiple biopsies/NO SOURCE FOR ANEMIA IDETIFIED-MOST LIKELY DUE TO ANEMIA OF CHRONIC DISEASE  .  Colonoscopy N/A 09/04/2013    Procedure: COLONOSCOPY;  Surgeon: Danie Binder, MD;  [facility];  Service: Endoscopy;  Laterality: N/A;  10:30AM-moved to 9:25 [name]  . Appendectomy    . Bascilic vein transposition Right 09/17/2013    Procedure: BRACHIAL VEIN TRANSPOSITION;  Surgeon: Conrad Clio, MD;  Location: [place];  Service: Vascular;  Laterality: Right;  . Insertion of dialysis catheter Left 09/17/2013    Procedure: INSERTION OF DIALYSIS CATHETER;  Surgeon: Conrad Sedillo, MD;  Location: [place];  Service: Vascular;  Laterality: Left;  . Bascilic vein transposition Right 10/29/2013    Procedure: RIGHT SECOND STAGE BRACHIAL VEIN TRANSPOSITION;  Surgeon: Conrad [place], MD;  Location: [facility] OR;  Service: Vascular;  Laterality: Right;  . Givens capsule study N/A 12/03/2013    Procedure: GIVENS CAPSULE STUDY;  Surgeon: Danie Binder, MD;  [facility];  Service: Endoscopy;  Laterality: N/A;  7:30      Family History  Problem Relation Age of Onset  . Colon cancer Neg Hx   . Cancer Mother   . Diabetes Mother   . Hypertension Mother   . Diabetes Father   . Hypertension Father   . Hypertension Sister    Social  Lives in [place], [place]/ married   reports that she has never smoked. She has never used smokeless tobacco. She reports that she does not drink alcohol or use illicit drugs.   Allergies  Allergen  Reactions  . Cephalexin Itching    Prior to Admission medications   Medication Sig Start Date End Date Taking? Authorizing Provider  allopurinol (ZYLOPRIM) 300 MG tablet Take 300 mg by mouth daily.   Yes Historical Provider, MD  docusate sodium (COLACE) 100 MG capsule Take 100 mg by mouth 2 (two) times daily.   Yes Historical Provider, MD  ferrous sulfate dried (SLOW FE) 160 (50 FE) MG TBCR SR tablet Take 640 mg by mouth daily.   Yes Historical Provider, MD  hydrALAZINE (APRESOLINE) 100 MG tablet Take 100 mg by mouth 3 (three) times daily.  09/30/13  Yes Historical  Provider, MD  labetalol (NORMODYNE) 300 MG tablet Take 1 tablet (300 mg total) by mouth 3 (three) times daily. 08/20/13  Yes Kinnie Feil, MD  levothyroxine (SYNTHROID, LEVOTHROID) 50 MCG tablet Take 1 tablet (50 mcg total) by mouth daily. 08/20/13  Yes Kinnie Feil, MD  lidocaine-prilocaine (EMLA) cream Apply 1 application topically as needed (on Dialysis days Tues,Thurs.,Sat.).   Yes Historical Provider, MD  Linaclotide (LINZESS) 290 MCG CAPS capsule 1 PO 30 MINS PRIOR TO BREAKFAST. IF O SATISFACTORY BM AFTER 2 WEEKS, TAKE WITH BREAKFAST. Patient taking differently: Take 290 mcg by mouth every morning.  05/01/14  Yes Danie Binder, MD  NIFEdipine (PROCARDIA) 20 MG capsule Take 60 mg by mouth 2 (two) times daily. 09/19/14  Yes Historical Provider, MD  omeprazole (PRILOSEC) 20 MG capsule Take 1 capsule (20 mg total) by mouth daily. 07/29/14  Yes Orvil Feil, NP  oxyCODONE-acetaminophen (PERCOCET/ROXICET) 5-325 MG per tablet Take 1 tablet by mouth every 6 (six) hours as needed (pain).  09/17/13  Yes Historical Provider, MD  sevelamer carbonate (RENVELA) 800 MG tablet Take 800-2,400 mg by mouth 3 (three) times daily with meals. Patient takes 1 tablet with snack and 3 tablets with meal   Yes Historical Provider, MD  sodium bicarbonate 650 MG tablet Take 1 tablet (650 mg total) by mouth 2 (two) times daily. 06/12/13  Yes Kathie Dike, MD    Continuous:   Results for orders placed or performed during the hospital encounter of 10/17/14 (from the past 48 hour(s))  Glucose, capillary     Status: Abnormal   Collection Time: 10/18/14  4:19 AM  Result Value Ref Range   Glucose-Capillary 123 (H) 70 - 99 mg/dL  Basic metabolic panel     Status: Abnormal   Collection Time: 10/18/14  8:29 AM  Result Value Ref Range   Sodium 137 135 - 145 mmol/L   Potassium 4.4 3.5 - 5.1 mmol/L   Chloride 98 96 - 112 mmol/L   CO2 29 19 - 32 mmol/L   Glucose, Bld 100 (H) 70 - 99 mg/dL   BUN 12 6 - 23 mg/dL    Creatinine, Ser 4.41 (H) 0.50 - 1.10 mg/dL    Comment: DELTA CHECK NOTED   Calcium 9.4 8.4 - 10.5 mg/dL   GFR calc non Af Amer 10 (L) >90 mL/min   GFR calc Af Amer 11 (L) >90 mL/min    Comment: (NOTE) The eGFR has been calculated using the CKD EPI equation. This calculation has not been validated in all clinical situations. eGFR's persistently <90 mL/min signify possible Chronic Kidney Disease.    Anion gap 10 5 - 15  CBC     Status: Abnormal   Collection Time: 10/18/14  8:29 AM  Result Value Ref Range   WBC 3.5 (L) 4.0 - 10.5 K/uL   RBC 4.18  3.87 - 5.11 MIL/uL   Hemoglobin 9.3 (L) 12.0 - 15.0 g/dL   HCT 30.9 (L) 36.0 - 46.0 %   MCV 73.9 (L) 78.0 - 100.0 fL   MCH 22.2 (L) 26.0 - 34.0 pg   MCHC 30.1 30.0 - 36.0 g/dL   RDW 19.9 (H) 11.5 - 15.5 %   Platelets 177 150 - 400 K/uL  Phosphorus     Status: None   Collection Time: 10/18/14  8:39 AM  Result Value Ref Range   Phosphorus 3.3 2.3 - 4.6 mg/dL  Glucose, capillary     Status: Abnormal   Collection Time: 10/18/14 12:39 PM  Result Value Ref Range   Glucose-Capillary 136 (H) 70 - 99 mg/dL  Glucose, capillary     Status: None   Collection Time: 10/18/14  7:59 PM  Result Value Ref Range   Glucose-Capillary 87 70 - 99 mg/dL   Comment 1 Notify RN   Urinalysis, Routine w reflex microscopic     Status: Abnormal   Collection Time: 10/19/14  1:17 AM  Result Value Ref Range   Color, Urine AMBER (A) YELLOW    Comment: BIOCHEMICALS MAY BE AFFECTED BY COLOR   APPearance CLOUDY (A) CLEAR   Specific Gravity, Urine 1.016 1.005 - 1.030   pH 8.0 5.0 - 8.0   Glucose, UA NEGATIVE NEGATIVE mg/dL   Hgb urine dipstick NEGATIVE NEGATIVE   Bilirubin Urine NEGATIVE NEGATIVE   Ketones, ur NEGATIVE NEGATIVE mg/dL   Protein, ur >300 (A) NEGATIVE mg/dL   Urobilinogen, UA 0.2 0.0 - 1.0 mg/dL   Nitrite NEGATIVE NEGATIVE   Leukocytes, UA SMALL (A) NEGATIVE  Urine microscopic-add on     Status: Abnormal   Collection Time: 10/19/14  1:17 AM  Result  Value Ref Range   Squamous Epithelial / LPF MANY (A) RARE   WBC, UA 3-6 <3 WBC/hpf   Bacteria, UA MANY (A) RARE   Urine-Other AMORPHOUS URATES/PHOSPHATES     Comment: MUCOUS PRESENT    ROS: No reported fevers, chills, chest pain , sob, gi symptoms. Only Positives related to Neurological= Some dizziness and Confusion .   Physical Exam: Filed Vitals:   10/19/14 1119  BP: 196/96  Pulse: 78  Temp:   Resp: 16     General:Alert, BF, Chronically ill in Appearance, Mild Agitation, Ox3 HEENT: [place], nonicteric, MMM Neck: supple Heart: RRR 2/6 sem lsb, no rub LV lift Lungs: faint rales, non labour ed  breathing  Abdomen: soft, NT, ND Extremities: bilat 1+ pedla edema Skin:  No overt rash/ warm and dry Neuro: moves all extrem,mild agitation inittially  with Dr. Jimmy Footman talking with her then calms some when examining /" At [facility] 2016 ". Dialysis Access: R UA AVF pos bruit   Dialysis Orders: Center: [facility]  on TTS . EDW 67 kg HD Bath 2.0k.2.5ca  Time 3hr 40mn Heparin 0. Access RUAAVF BFR 400 DFR 600    Hectorol 5 mcg IV/HD 10000 epo   Units IV/HD  Venofer 529mweekly    Assessment/Plan 1. AMS with Neurology consulting with EEG abnormal/ External Capsule Hemorrhage/ htn /  2. ESRD -  HD today (schedule [facility]. TTS) use no heparin/ IF Agitation with HD  give Ativan  Very concerned about paranoid behavior.  Needs lower vol to help with bp. Slow UF, ^ time 3. Hypertension/volume  - BP ^^/ 196/96 / Needs some vol uf with hd / Noted bring down BP slowy 4. Anemia  - HGB 9.3 esa and fe  on hd  With HTN start Aranesp tues 5. Metabolic bone disease -  Vit d on hd and  Renvela binder with meals 6. Nutrition - Renal diet with renavit  Ernest Haber, PA-C [place] [phone] 10/19/2014, 12:33 PM .I have seen and examined this patient and agree with the plan of care seen, eval, extensive time with patient, family, primary about status. Reviewed past hx to familiarize [place]  with this patient, new to [place]. Will need to mildly sedate. ? aggitation from brain injury. .  , L 10/19/2014, 2:51 PM

## 2014-10-20 LAB — URINE CULTURE

## 2014-10-20 LAB — AMMONIA: Ammonia: 20 umol/L (ref 11–32)

## 2014-10-20 LAB — GLUCOSE, CAPILLARY
GLUCOSE-CAPILLARY: 82 mg/dL (ref 70–99)
GLUCOSE-CAPILLARY: 104 mg/dL — AB (ref 70–99)

## 2014-10-20 MED ORDER — HYDRALAZINE HCL 50 MG PO TABS
100.0000 mg | ORAL_TABLET | Freq: Three times a day (TID) | ORAL | Status: DC
  Administered 2014-10-20 – 2014-10-21 (×3): 100 mg via ORAL
  Filled 2014-10-20 (×4): qty 2

## 2014-10-20 MED ORDER — INSULIN ASPART 100 UNIT/ML ~~LOC~~ SOLN
0.0000 [IU] | SUBCUTANEOUS | Status: DC
  Administered 2014-10-21: 2 [IU] via SUBCUTANEOUS

## 2014-10-20 MED ORDER — AMLODIPINE BESYLATE 5 MG PO TABS
5.0000 mg | ORAL_TABLET | Freq: Every day | ORAL | Status: DC
  Filled 2014-10-20: qty 1

## 2014-10-20 MED ORDER — AMLODIPINE BESYLATE 10 MG PO TABS
10.0000 mg | ORAL_TABLET | Freq: Every day | ORAL | Status: DC
  Administered 2014-10-20: 10 mg via ORAL
  Filled 2014-10-20: qty 1

## 2014-10-20 MED ORDER — LABETALOL HCL 200 MG PO TABS
300.0000 mg | ORAL_TABLET | Freq: Three times a day (TID) | ORAL | Status: DC
  Administered 2014-10-20 – 2014-10-21 (×3): 300 mg via ORAL
  Filled 2014-10-20 (×6): qty 1

## 2014-10-20 NOTE — Progress Notes (Addendum)
Pt returned from dialysis, pt confused and drowsy.  Family at bedside and awaiting for 30 minutes for pt to take meds.  Pt uncooperative and refused most of her 10 PM meds.

## 2014-10-20 NOTE — Progress Notes (Signed)
Subjective: No overnight events. Family feels she is improving but not back to her baseline. BP elevated overnight ranging from SBP 175 to 195.   Objective: Current vital signs: BP 181/65 mmHg  Pulse 83  Temp(Src) 98.8 F (37.1 C) (Oral)  Resp 18  Ht 5\' 8"  (1.727 m)  Wt 60.1 kg (132 lb 7.9 oz)  BMI 20.15 kg/m2  SpO2 100% Vital signs in last 24 hours: Temp:  [97.3 F (36.3 C)-98.8 F (37.1 C)] 98.8 F (37.1 C) (03/20 0738) Pulse Rate:  [64-84] 83 (03/20 0738) Resp:  [16-18] 18 (03/20 0738) BP: (103-201)/(56-105) 181/65 mmHg (03/20 0738) SpO2:  [99 %-100 %] 100 % (03/20 0738) Weight:  [60.1 kg (132 lb 7.9 oz)-62.7 kg (138 lb 3.7 oz)] 60.1 kg (132 lb 7.9 oz) (03/19 2240)  Intake/Output from previous day: 03/19 0701 - 03/20 0700 In: -  Out: 2400  Intake/Output this shift:   Nutritional status: Diet renal/carb modified with 1200 ml fluid restriction  Neurologic Exam: Mental Status: Alert, slow response but oriented to name, [facility], March 2016. Follows simple commands. Speech fluent without evidence of aphasia.  Cranial Nerves: II: upils equal, round, reactive to light  III,IV, VI: ptosis not present, extra-ocular motions intact bilaterally V,VII: smile symmetric, facial light touch sensation normal bilaterally Motor: Right :Upper extremity 5/5Left: Upper extremity 5/5 Lower extremity 5/5Lower extremity 5/5  Lab Results: Basic Metabolic Panel:  Recent Labs Lab 10/14/14 1608 10/15/14 0253 10/15/14 0254 10/17/14 0707 10/18/14 0829 10/18/14 0839 10/19/14 1900  NA 137 138  --  136 137  --  133*  K 3.0* 3.0*  --  3.9 4.4  --  4.1  CL 100 101  --  99 98  --  96  CO2 28 27  --  29 29  --  28  GLUCOSE 106* 95  --  100* 100*  --  82  BUN 21 23  --  18 12  --  19  CREATININE 7.56* 8.01*  --  6.96* 4.41*  --  7.37*  CALCIUM 9.5 8.8  --  9.3 9.4  --  9.6  MG  --    --  1.8  --   --   --   --   PHOS  --   --   --   --   --  3.3 5.1*    Liver Function Tests:  Recent Labs Lab 10/14/14 1608 10/15/14 0253 10/19/14 1900  AST 16 12 13   ALT 9 7 10   ALKPHOS 30* 28* 31*  BILITOT 0.5 0.4 0.6  PROT 6.9 5.8* 6.2  ALBUMIN 3.5 2.9* 3.2*   No results for input(s): LIPASE, AMYLASE in the last 168 hours. No results for input(s): AMMONIA in the last 168 hours.  CBC:  Recent Labs Lab 10/14/14 1608 10/15/14 0253 10/17/14 0707 10/18/14 0829 10/19/14 1900  WBC 3.9* 4.0 3.1* 3.5* 3.6*  NEUTROABS 2.0  --  1.4*  --   --   HGB 8.6* 7.9* 8.6* 9.3* 8.3*  HCT 29.1* 25.5* 28.4* 30.9* 27.0*  MCV 76.0* 74.8* 75.1* 73.9* 73.8*  PLT 191 176 175 177 178    Cardiac Enzymes: No results for input(s): CKTOTAL, CKMB, CKMBINDEX, TROPONINI in the last 168 hours.  Lipid Panel: No results for input(s): CHOL, TRIG, HDL, CHOLHDL, VLDL, LDLCALC in the last 168 hours.  CBG:  Recent Labs Lab 10/18/14 0419 10/18/14 1239 10/18/14 1959  GLUCAP 123* 136* 61    Microbiology: Results for orders placed or performed during  the hospital encounter of 10/17/14  MRSA PCR Screening     Status: None   Collection Time: 10/17/14 10:50 AM  Result Value Ref Range Status   MRSA by PCR NEGATIVE NEGATIVE Final    Comment:        The GeneXpert MRSA Assay (FDA approved for NASAL specimens only), is one component of a comprehensive MRSA colonization surveillance program. It is not intended to diagnose MRSA infection nor to guide or monitor treatment for MRSA infections.     Coagulation Studies: No results for input(s): LABPROT, INR in the last 72 hours.  Imaging: Mr Herby Abraham Contrast  10/19/2014   CLINICAL DATA:  Dizziness and confusion. Recent brain hemorrhage, now resolved. Subsequent encounter.  EXAM: MRI HEAD WITHOUT CONTRAST  TECHNIQUE: Multiplanar, multiecho pulse sequences of the brain and surrounding structures were obtained without intravenous contrast.   COMPARISON:  MR brain 10/14/2014.  CT head 10/17/2014.  FINDINGS: Redemonstrated is what is felt to be a slightly greater than 1 cm subacute intracerebral hematoma involving the LEFT lentiform nucleus and external capsule. Stable surrounding T1 and T2 hypointense rim.  Generalized atrophy with chronic microvascular ischemic change. No features suggestive of PRES.  Widely scattered microhemorrhages throughout the brain, likely sequelae of hypertensive cerebrovascular disease. These are also stable.  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 Small gliotic nubbin of temporal lobe 10 x 11 mm unchanged. 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  Stable flow voids in extracranial soft tissues.  IMPRESSION: Chronic changes as described with subacute LEFT basal ganglia hematoma, atrophy with chronic microvascular ischemic change, and scattered microhemorrhages. No features suggestive of acute hypertensive encephalopathy or PRES.  Stable LEFT greater sphenoid wing medially projecting encephalocele and meningocele.   Electronically Signed   By: Rolla Flatten M.D.   On: 10/19/2014 11:18     Assessment/Plan: 64 year old female with altered mental status.Etiology unclear.Suspect it is likely multi-factorial. Suspect a component of hypertensive encephalopathy as blood pressure remains elevated. Mental status appears improved compared to admission evaluation.  Recommendations: 1. BP control with target SBP less than 160 2. Will continue to follow    LOS: 3 days   Jim Like, DO [facility] [phone]  If 7pm- 7am, please page neurology on call as listed in [place]. 10/20/2014  8:57 AM

## 2014-10-20 NOTE — Progress Notes (Addendum)
Physical Therapy Evaluation Patient Details Name: Sonya [name] MRN: [id number] DOB: 06-22-1951 Today's Date: 10/20/2014   History of Present Illness  Admitted with confusion  Hypertensive emergency and encephalopathy  Past Medical History  Diagnosis Date  . HTN (hypertension)   . Hypothyroidism   . Uterine cancer 2012  . Anemia   . CKD (chronic kidney disease)   . Dysphagia   . Hyperparathyroidism due to renal insufficiency   . GERD (gastroesophageal reflux disease)   . Diverticulitis    ESRD HD TTS Past Surgical History  Procedure Laterality Date  . Cholecystectomy    . Cesarean section    . Laparoscopic total hysterectomy    . Colonoscopy  2000    TICS, IH  . Colonoscopy  2003 NUR BRBPR D50 V6    DC/[place] TICS, IH  . Abdominal hysterectomy    . Nasal septum surgery    . Esophagogastroduodenoscopy (egd) with esophageal dilation N/A 06/08/2013    Dr. Fields:Stricture at the gastroesophageal junction/MILD NON-erosive gastritis (inflammation) was found in the gastric antrum; multiple biopsies/NO SOURCE FOR ANEMIA IDETIFIED-MOST LIKELY DUE TO ANEMIA OF CHRONIC DISEASE  . Colonoscopy N/A 09/04/2013    Procedure: COLONOSCOPY;  Surgeon: Danie Binder, MD;  [facility];  Service: Endoscopy;  Laterality: N/A;  10:30AM-moved to 9:25 [name]  . Appendectomy    . Bascilic vein transposition Right 09/17/2013    Procedure: BRACHIAL VEIN TRANSPOSITION;  Surgeon: Conrad Penelope, MD;  Location: [place];  Service: Vascular;  Laterality: Right;  . Insertion of dialysis catheter Left 09/17/2013    Procedure: INSERTION OF DIALYSIS CATHETER;  Surgeon: Conrad , MD;  Location: [place];  Service: Vascular;  Laterality: Left;  . Bascilic vein transposition Right 10/29/2013    Procedure: RIGHT SECOND STAGE BRACHIAL VEIN TRANSPOSITION;  Surgeon: Conrad , MD;  [facility]: [facility];  Service: Vascular;  Laterality: Right;  . Givens capsule study N/A 12/03/2013    Procedure: GIVENS  CAPSULE STUDY;  Surgeon: Danie Binder, MD;  [facility];  Service: Endoscopy;  Laterality: N/A;  7:30     Clinical Impression  Pt admitted with above diagnosis. Pt currently with functional limitations due to the deficits listed below (see PT Problem List).  Pt will benefit from skilled PT to increase their independence and safety with mobility to allow discharge to the venue listed below.       Follow Up Recommendations Home health PT    Equipment Recommendations   (Rollator - I think she already has)    Recommendations for Other Services OT consult     Precautions / Restrictions Precautions Precautions: Fall      Mobility  Bed Mobility                  Transfers Overall transfer level: Needs assistance Equipment used: 1 person hand held assist Transfers: Sit to/from Stand Sit to Stand: Min assist         General transfer comment: Min assist to steady  Ambulation/Gait Ambulation/Gait assistance: Min guard Ambulation Distance (Feet): 80 Feet Assistive device: Rolling walker (2 wheeled) Gait Pattern/deviations: Step-through pattern;Trunk flexed Gait velocity: Quite slow Gait velocity interpretation: Below normal speed for age/gender General Gait Details: Cues for RW proximity; Pt appropriately requested to use the RW after needing help to steady herself at initial stand  Financial trader Rankin (Stroke  Patients Only)       Balance Overall balance assessment: Needs assistance         Standing balance support: Single extremity supported;Bilateral upper extremity supported Standing balance-Leahy Scale: Fair                               Pertinent Vitals/Pain Pain Assessment: No/denies pain    Home Living Family/patient expects to be discharged to:: Private residence Living Arrangements: Spouse/significant other Available Help at Discharge: Family;Available PRN/intermittently Type  of Home: House Home Access: Stairs to enter Entrance Stairs-Rails: Right Entrance Stairs-Number of Steps:  (1+1) Home Layout: One level Home Equipment: Bedside commode;Walker - 4 wheels (Will need to verify that pt has 4 wheeled RW)      Prior Function Level of Independence: Independent with assistive device(s);Independent               Hand Dominance        Extremity/Trunk Assessment   Upper Extremity Assessment: Generalized weakness           Lower Extremity Assessment: Generalized weakness      Cervical / Trunk Assessment: Kyphotic  Communication   Communication: No difficulties  Cognition Arousal/Alertness: Awake/alert Behavior During Therapy: WFL for tasks assessed/performed Overall Cognitive Status: Impaired/Different from baseline Area of Impairment: Orientation Orientation Level: Disoriented to;Situation   Memory: Decreased short-term memory (did not recall going to HD yesterday)         General Comments: Appropriate and good participation; many questions re; why she is in the hospital, and does not remember going to HD yesterday    General Comments      Exercises        Assessment/Plan    PT Assessment Patient needs continued PT services  PT Diagnosis Difficulty walking;Generalized weakness   PT Problem List Decreased strength;Decreased activity tolerance;Decreased balance;Decreased mobility;Decreased cognition;Decreased coordination;Decreased knowledge of use of DME;Decreased knowledge of precautions  PT Treatment Interventions DME instruction;Gait training;Stair training;Functional mobility training;Therapeutic activities;Therapeutic exercise;Balance training;Neuromuscular re-education;Cognitive remediation;Patient/family education   PT Goals (Current goals can be found in the Care Plan section) Acute Rehab PT Goals Patient Stated Goal: states she would like to be home PT Goal Formulation: With patient/family Time For Goal Achievement:  11/03/14 Potential to Achieve Goals: Good    Frequency Min 3X/week   Barriers to discharge   Would like to verify that pt has 24 hour assist    Co-evaluation               End of Session Equipment Utilized During Treatment: Gait belt Activity Tolerance: Patient tolerated treatment well Patient left: in chair;with call bell/phone within reach;with family/visitor present Nurse Communication: Mobility status         Time: [id number] PT Time Calculation (min) (ACUTE ONLY): 18 min   Charges:   PT Evaluation $Initial PT Evaluation Tier I: 1 Procedure     PT G CodesQuin Hoop 10/20/2014, 2:13 PM  Roney [place], [place] Pager [phone] Office [phone]

## 2014-10-20 NOTE — Progress Notes (Addendum)
PROGRESS NOTE  TERRE LESAR [id number] DOB: 1950/12/13 DOA: 10/17/2014 PCP: [name], MD  Summary: 64 year old woman presented with "confusion", present when she woke up 3/17 in preparation for dialysis. Had difficulty putting her teeth and. Noted to have significantly high blood pressure was systolic in the [id number], treated with labetalol infusion. Per admitting physician's note systolic blood pressure greater than 180 was noted on previous hospitalization. Admitted for hypertensive emergency with acute encephalopathy. No confusion was noted by admitting physician, however nursing noted confusion overnight.  Discharge from the hospital 3/16 after being diagnosed with intracranial hemorrhage and hypertensive emergency. According to chart, neurosurgery recommended no intervention.  Outpatient follow-up was recommended.  Assessment/Plan: 1. Accelerated HTN/HTN  patient apparently refused her p.m. medications, blood pressure still uncontrolled, neurology feels that this is primarily contributing to her altered mental status secondary to hypertensive encephalopathy. Increase hydralazine , increase labetalol, HD should improve papameters.Goal SBP <160.  No focal neuro deficits.  By chart review it appears that her blood pressure responds to hemodialysis.   2. Acute encephalopathy. Remains confused intermittently, abnl EEG, as per neurology EEG is slightly abnormal because of the patient's intracranial hemorrhage. If patient remains confused we will repeat an EEG .  Afebrile, no signs or symptoms to suggest infection. UA negative.. Chest x-ray without acute abnormalities. Suspect encephalopathy related to recent hemorrhage. We'll check vitamin B-12 level, ammonia level   3. End-stage renal disease. Appreciate nephrology input, Dr Deterding ,TTS at [place]  4. Anemia of chronic disease. Per nephrology   5. Intracerebral bleed 3/14. 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 Per Dr.  Jerilee Hoh: "Neurosurgery, Dr. Arnoldo Morale, reviewed MRI at that time and opined no surgical intervention was warranted and recommended repeat CT scan which was stable". Plan was to f/u with neurosurgeon Dr. Arnoldo Morale in 1 week.  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   6. Moderately large encephalocele and meningocele. Outpatient follow with neurosurgery planned.   7. Severe protein calorie malnutrition.     . Family at bedside including 2 daughters, 2 nieces. Family requests/insists on transfer to [facility] for further evaluation and neurology evaluation, not available at [facility] over the weekend.  Patient appears stable for transfer to telemetry bed at [facility], neurology consult placed, d/w Dr. Janann Colonel who will see in consult.  In the meantime continue supportive care. SCDs. No anticoagulants or antiplatelet therapy secondary to above.  Soft diet for intermittent solid food dysphagia, further workup per GI as an outpaient.  Reyne Dumas , MD  [facility]  Pager [phone] If 7PM-7AM, please contact night-coverage at [email or web address], password [facility] 10/20/2014, 12:07 PM  LOS: 3 days   Consultants:  Nephrology  Procedures:  Hemodialysis 3/17  Antibiotics:    HPI/Subjective:    Patient refused medications for her blood pressure last night, remains confused  Objective: Filed Vitals:   10/19/14 2330 10/20/14 0229 10/20/14 0738 10/20/14 1052  BP: 195/77 175/62 181/65 168/66  Pulse: 80 84 83 76  Temp: 98.2 F (36.8 C) 98.4 F (36.9 C) 98.8 F (37.1 C) 98.8 F (37.1 C)  TempSrc: Axillary Axillary Oral Oral  Resp: 16 18 18 18   Height:      Weight:      SpO2: 100% 100% 100% 100%    Intake/Output Summary (Last 24 hours) at 10/20/14 1207 Last data filed at 10/19/14 2240  Gross per 24 hour  Intake      0 ml  Output   2400 ml  Net  -2400 ml     Filed Weights   10/18/14 2054 10/19/14  1830 10/19/14 2240  Weight: 66.271 kg (146 lb 1.6 oz) 62.7 kg (138 lb 3.7 oz) 60.1 kg (132 lb 7.9 oz)    Exam:      General:  Appears calm but confused,. Nontoxic. Eyes: PERRL, normal lids, irises  ENT: grossly normal hearing, lips & tongue Cardiovascular: RRR, no m/r/g. No LE edema. Telemetry: SR, no arrhythmias  Respiratory: CTA bilaterally, no w/r/r. Normal respiratory effort. Abdomen: soft, ntnd Skin: no rash or induration seen  Musculoskeletal: grossly normal tone BUE/BLE; exam limited only follows commands intermittently, able to lift both arms and legs. Psychiatric: Confused. Oriented to location but not month or year. Speech is intelligible, fluent but not always appropriate. Neurologic: Exam limited by confusion but cranial nerves appear intact. No definite pronator drift. No focal neuro deficits noted.  Data Reviewed:  Basic metabolic panel consistent with end-stage renal disease.  Hemoglobin stable 9.3   Chest x-ray: trace effusions  CT head 3/17: No change in appearance of head CT, stable evolving left-sided white matter infarction involving the basal ganglia, posterior limb of the internal capsule and external capsule. No new hemorrhage or infarction identified.  EKG sinus rhythm, normal QT.   Scheduled Meds: . allopurinol  300 mg Oral Daily  . amLODipine  10 mg Oral QHS  . [START ON 10/22/2014] darbepoetin (ARANESP) injection - DIALYSIS  100 mcg Intravenous Q Tue-HD  . docusate sodium  100 mg Oral BID  . [START ON 10/22/2014] doxercalciferol  5 mcg Intravenous Q T,Th,Sa-HD  . [START ON 10/24/2014] ferric gluconate (FERRLECIT/NULECIT) IV  62.5 mg Intravenous Q Thu-HD  . hydrALAZINE  100 mg Oral 3 times per day  . insulin aspart  0-9 Units Subcutaneous 6 times per day  . labetalol  300 mg Oral TID  . levothyroxine  50 mcg Oral QAC breakfast  . Linaclotide  290 mcg Oral q morning - 10a  . multivitamin  1 tablet Oral QHS  . pantoprazole  40 mg Oral Daily  .  sevelamer carbonate  2,400 mg Oral TID WC   Continuous Infusions:   Principal Problem:   Hypertensive emergency Active Problems:   HTN (hypertension)   End stage renal disease   Intracerebral bleed   Protein-calorie malnutrition, severe   Confusion   Time spent 45 minutes, greater than 50% counseling and coordination of care

## 2014-10-20 NOTE — Progress Notes (Signed)
Subjective: Interval History: has complaints not sure why she is here.  Objective: Vital signs in last 24 hours: Temp:  [97.3 F (36.3 C)-98.8 F (37.1 C)] 98.8 F (37.1 C) (03/20 0738) Pulse Rate:  [64-84] 83 (03/20 0738) Resp:  [16-18] 18 (03/20 0738) BP: (103-201)/(56-105) 181/65 mmHg (03/20 0738) SpO2:  [99 %-100 %] 100 % (03/20 0738) Weight:  [60.1 kg (132 lb 7.9 oz)-62.7 kg (138 lb 3.7 oz)] 60.1 kg (132 lb 7.9 oz) (03/19 2240) Weight change: -3.571 kg (-7 lb 14 oz)  Intake/Output from previous day: 03/19 0701 - 03/20 0700 In: -  Out: 2400  Intake/Output this shift:    General appearance: slowed mentation and confused Resp: diminished breath sounds bilaterally Cardio: S1, S2 normal and systolic murmur: holosystolic 2/6, blowing at apex GI: pos bs,soft Extremities: AVF B&T  Lab Results:  Recent Labs  10/18/14 0829 10/19/14 1900  WBC 3.5* 3.6*  HGB 9.3* 8.3*  HCT 30.9* 27.0*  PLT 177 178   BMET:  Recent Labs  10/18/14 0829 10/19/14 1900  NA 137 133*  K 4.4 4.1  CL 98 96  CO2 29 28  GLUCOSE 100* 82  BUN 12 19  CREATININE 4.41* 7.37*  CALCIUM 9.4 9.6   No results for input(s): PTH in the last 72 hours. Iron Studies: No results for input(s): IRON, TIBC, TRANSFERRIN, FERRITIN in the last 72 hours.  Studies/Results: Mr Herby Abraham Contrast  10/19/2014   CLINICAL DATA:  Dizziness and confusion. Recent brain hemorrhage, now resolved. Subsequent encounter.  EXAM: MRI HEAD WITHOUT CONTRAST  TECHNIQUE: Multiplanar, multiecho pulse sequences of the brain and surrounding structures were obtained without intravenous contrast.  COMPARISON:  MR brain 10/14/2014.  CT head 10/17/2014.  FINDINGS: Redemonstrated is what is felt to be a slightly greater than 1 cm subacute intracerebral hematoma involving the LEFT lentiform nucleus and external capsule. Stable surrounding T1 and T2 hypointense rim.  Generalized atrophy with chronic microvascular ischemic change. No features  suggestive of PRES.  Widely scattered microhemorrhages throughout the brain, likely sequelae of hypertensive cerebrovascular disease. These are also stable.  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 Small gliotic nubbin of temporal lobe 10 x 11 mm unchanged. 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  Stable flow voids in extracranial soft tissues.  IMPRESSION: Chronic changes as described with subacute LEFT basal ganglia hematoma, atrophy with chronic microvascular ischemic change, and scattered microhemorrhages. No features suggestive of acute hypertensive encephalopathy or PRES.  Stable LEFT greater sphenoid wing medially projecting encephalocele and meningocele.   Electronically Signed   By: Rolla Flatten M.D.   On: 10/19/2014 11:18    I have reviewed the patient's current medications.  Assessment/Plan: 1 ESRD did well on HD 2 HTN lower bp , add amlod and lower vol furthur 3 Anemia 4 ICH per neuro, limit hep at HD 5 Confusion 6 HPTH P HD TTS, amlod, epo    LOS: 3 days   Sonya [name] 10/20/2014,10:21 AM

## 2014-10-21 LAB — GLUCOSE, CAPILLARY
GLUCOSE-CAPILLARY: 97 mg/dL (ref 70–99)
GLUCOSE-CAPILLARY: 114 mg/dL — AB (ref 70–99)
GLUCOSE-CAPILLARY: 100 mg/dL — AB (ref 70–99)
Glucose-Capillary: 194 mg/dL — ABNORMAL HIGH (ref 70–99)
Glucose-Capillary: 89 mg/dL (ref 70–99)
Glucose-Capillary: 97 mg/dL (ref 70–99)

## 2014-10-21 LAB — VITAMIN B12: Vitamin B-12: 549 pg/mL (ref 211–911)

## 2014-10-21 MED ORDER — LABETALOL HCL 200 MG PO TABS
300.0000 mg | ORAL_TABLET | Freq: Three times a day (TID) | ORAL | Status: DC
  Administered 2014-10-22 – 2014-10-23 (×4): 300 mg via ORAL
  Filled 2014-10-21 (×8): qty 1

## 2014-10-21 MED ORDER — HYDRALAZINE HCL 50 MG PO TABS
50.0000 mg | ORAL_TABLET | Freq: Three times a day (TID) | ORAL | Status: DC
  Administered 2014-10-22 – 2014-10-23 (×3): 50 mg via ORAL
  Filled 2014-10-21 (×3): qty 1

## 2014-10-21 NOTE — Progress Notes (Signed)
[facility] ROUNDING NOTE   Subjective:   Interval History:  Appears to be doing well with no complaints    Objective:  Vital signs in last 24 hours:  Temp:  [98.2 F (36.8 C)-98.5 F (36.9 C)] 98.5 F (36.9 C) (03/21 0925) Pulse Rate:  [74-80] 74 (03/21 0604) Resp:  [18] 18 (03/21 0925) BP: (120-208)/(39-84) 120/39 mmHg (03/21 0925) SpO2:  [97 %-100 %] 99 % (03/21 0925)  Weight change:  Filed Weights   10/18/14 2054 10/19/14 1830 10/19/14 2240  Weight: 66.271 kg (146 lb 1.6 oz) 62.7 kg (138 lb 3.7 oz) 60.1 kg (132 lb 7.9 oz)    Intake/Output: I/O last 3 completed shifts: In: 480 [P.O.:480] Out: 2400 [Other:2400]   Intake/Output this shift:     CVS- RRR RS- CTA ABD- BS present soft non-distended EXT- no edema   Basic Metabolic Panel:  Recent Labs Lab 10/14/14 1608 10/15/14 0253 10/15/14 0254 10/17/14 0707 10/18/14 0829 10/18/14 0839 10/19/14 1900  NA 137 138  --  136 137  --  133*  K 3.0* 3.0*  --  3.9 4.4  --  4.1  CL 100 101  --  99 98  --  96  CO2 28 27  --  29 29  --  28  GLUCOSE 106* 95  --  100* 100*  --  82  BUN 21 23  --  18 12  --  19  CREATININE 7.56* 8.01*  --  6.96* 4.41*  --  7.37*  CALCIUM 9.5 8.8  --  9.3 9.4  --  9.6  MG  --   --  1.8  --   --   --   --   PHOS  --   --   --   --   --  3.3 5.1*    Liver Function Tests:  Recent Labs Lab 10/14/14 1608 10/15/14 0253 10/19/14 1900  AST 16 12 13   ALT 9 7 10   ALKPHOS 30* 28* 31*  BILITOT 0.5 0.4 0.6  PROT 6.9 5.8* 6.2  ALBUMIN 3.5 2.9* 3.2*   No results for input(s): LIPASE, AMYLASE in the last 168 hours.  Recent Labs Lab 10/20/14 1325  AMMONIA 20    CBC:  Recent Labs Lab 10/14/14 1608 10/15/14 0253 10/17/14 0707 10/18/14 0829 10/19/14 1900  WBC 3.9* 4.0 3.1* 3.5* 3.6*  NEUTROABS 2.0  --  1.4*  --   --   HGB 8.6* 7.9* 8.6* 9.3* 8.3*  HCT 29.1* 25.5* 28.4* 30.9* 27.0*  MCV 76.0* 74.8* 75.1* 73.9* 73.8*  PLT 191 176 175 177 178    Cardiac  Enzymes: No results for input(s): CKTOTAL, CKMB, CKMBINDEX, TROPONINI in the last 168 hours.  BNP: Invalid input(s): POCBNP  CBG:  Recent Labs Lab 10/20/14 2042 10/21/14 0025 10/21/14 0416 10/21/14 0825 10/21/14 1123  GLUCAP 104* 97 42 97 194*    Microbiology: Results for orders placed or performed during the hospital encounter of 10/17/14  MRSA PCR Screening     Status: None   Collection Time: 10/17/14 10:50 AM  Result Value Ref Range Status   MRSA by PCR NEGATIVE NEGATIVE Final    Comment:        The GeneXpert MRSA Assay (FDA approved for NASAL specimens only), is one component of a comprehensive MRSA colonization surveillance program. It is not intended to diagnose MRSA infection nor to guide or monitor treatment for MRSA infections.   Culture, Urine     Status: None  Collection Time: 10/19/14  1:16 AM  Result Value Ref Range Status   Specimen Description URINE, RANDOM  Final   Special Requests NONE  Final   Colony Count   Final    >=100,000 COLONIES/ML Performed at [facility]    Culture   Final    Multiple bacterial morphotypes present, none predominant. Suggest appropriate recollection if clinically indicated. Performed at [facility]    Report Status 10/20/2014 FINAL  Final    Coagulation Studies: No results for input(s): LABPROT, INR in the last 72 hours.  Urinalysis:  Recent Labs  10/19/14 0117  COLORURINE AMBER*  LABSPEC 1.016  PHURINE 8.0  GLUCOSEU NEGATIVE  HGBUR NEGATIVE  BILIRUBINUR NEGATIVE  KETONESUR NEGATIVE  PROTEINUR >300*  UROBILINOGEN 0.2  NITRITE NEGATIVE  LEUKOCYTESUR SMALL*      Imaging: No results found.   Medications:     . allopurinol  300 mg Oral Daily  . amLODipine  10 mg Oral QHS  . [START ON 10/22/2014] darbepoetin (ARANESP) injection - DIALYSIS  100 mcg Intravenous Q Tue-HD  . docusate sodium  100 mg Oral BID  . [START ON 10/22/2014] doxercalciferol  5 mcg Intravenous Q T,Th,Sa-HD  .  [START ON 10/24/2014] ferric gluconate (FERRLECIT/NULECIT) IV  62.5 mg Intravenous Q Thu-HD  . hydrALAZINE  100 mg Oral 3 times per day  . insulin aspart  0-9 Units Subcutaneous 6 times per day  . labetalol  300 mg Oral TID  . levothyroxine  50 mcg Oral QAC breakfast  . Linaclotide  290 mcg Oral q morning - 10a  . multivitamin  1 tablet Oral QHS  . pantoprazole  40 mg Oral Daily  . sevelamer carbonate  2,400 mg Oral TID WC   acetaminophen **OR** acetaminophen, labetalol, LORazepam, LORazepam, ondansetron **OR** ondansetron (ZOFRAN) IV, sevelamer carbonate  Assessment/ Plan:   Appreciate assistance from [facility]. TTS dialysis patient that was admitted with confusion and hypertension following intracranial hemorrhage   ESRD TTS  Anemia stable  HTN continue control of BP and  ultrafiltration   LOS: 4 Sonya [name] @TODAY @11 :57 AM

## 2014-10-21 NOTE — Progress Notes (Signed)
Pt BP low 89/54.  MD made aware: verbal orders to hold BP medications until BP >140, and D/C ativan.  Will continue to monitor. Cori Razor, RN

## 2014-10-21 NOTE — Evaluation (Signed)
Occupational Therapy Evaluation Patient Details Name: Sonya [name] MRN: [id number] DOB: Dec 22, 1950 Today's Date: 10/21/2014    History of Present Illness Admitted with confusion  Hypertensive emergency and encephalopathy   Clinical Impression   Pt admitted with above. She demonstrates the below listed deficits and will benefit from continued OT to maximize safety and independence with BADLs.  Pt is very lethargic during eval with very limited participation.  She requires mod - max A for BADLs due to lethargy, and mod A for ambulating 4' toward bathroom.  Pt became too fatigued to ambulate farther and returned to recliner with mod A - pt with heavy posterior lean.  Spoke with spouse who wishes to take pt home, but he is not comfortable with her going home with current level of lethargy - BP 107/50.  RN notified of status of session and level of lethargy.  Anticipate once she is more alert, that she will progress quickly toward goals.       Follow Up Recommendations  Home health OT;Supervision/Assistance - 24 hour    Equipment Recommendations  Tub/shower bench    Recommendations for Other Services       Precautions / Restrictions Precautions Precautions: Fall      Mobility Bed Mobility                  Transfers Overall transfer level: Needs assistance Equipment used: Rolling walker (2 wheeled) Transfers: Sit to/from [employer] Sit to Stand: Min assist Stand pivot transfers: Mod assist       General transfer comment: Pt fatigues quickly then requires mod A for balance     Balance Overall balance assessment: Needs assistance Sitting-balance support: Feet supported Sitting balance-Leahy Scale: Good     Standing balance support: Bilateral upper extremity supported Standing balance-Leahy Scale: Poor Standing balance comment: Pt requires mod A to maintain balance to pull pants over hips                             ADL Overall  ADL's : Needs assistance/impaired Eating/Feeding: Set up;Sitting   Grooming: Wash/dry face;Oral care;Applying deodorant;Brushing hair;Sitting;Moderate assistance   Upper Body Bathing: Moderate assistance;Sitting   Lower Body Bathing: Maximal assistance;Sit to/from stand   Upper Body Dressing : Moderate assistance;Sitting   Lower Body Dressing: Moderate assistance;Sit to/from stand   Toilet Transfer: Moderate assistance;Ambulation;BSC;RW   Toileting- Clothing Manipulation and Hygiene: Maximal assistance;Sit to/from stand       Functional mobility during ADLs: Moderate assistance;Rolling walker General ADL Comments: Pt very lethargic.  Unable to maintain arousal and attention to complete tasks.       Vision Additional Comments: unable to assess due to level of lethargy    Perception     Praxis      Pertinent Vitals/Pain Pain Assessment: No/denies pain     Hand Dominance Right   Extremity/Trunk Assessment Upper Extremity Assessment Upper Extremity Assessment: Generalized weakness   Lower Extremity Assessment Lower Extremity Assessment: Defer to PT evaluation   Cervical / Trunk Assessment Cervical / Trunk Assessment: Kyphotic   Communication Communication Communication: No difficulties   Cognition Arousal/Alertness: Lethargic Behavior During Therapy: Flat affect Overall Cognitive Status: Impaired/Different from baseline Area of Impairment: Orientation;Attention;Following commands;Safety/judgement;Awareness;Problem solving Orientation Level: Disoriented to;Place;Time Current Attention Level: Sustained Memory: Decreased short-term memory Following Commands: Follows one step commands consistently;Follows one step commands with increased time Safety/Judgement: Decreased awareness of safety;Decreased awareness of deficits   Problem Solving: Slow processing;Decreased initiation;Difficulty  sequencing;Requires verbal cues;Requires tactile cues General Comments: Pt very  lethargic this pm.  She thinks she is at [facility] despite redirection    General Comments       Exercises       Shoulder Instructions      Home Living Family/patient expects to be discharged to:: Private residence Living Arrangements: Spouse/significant other Available Help at Discharge: Family;Available PRN/intermittently Type of Home: House Home Access: Stairs to enter [employer] of Steps:  (1+1) Entrance Stairs-Rails: Right Home Layout: One level     Bathroom Shower/Tub: Tub/shower unit Shower/tub characteristics: Engineer, building services: Standard     Home Equipment: Bedside commode;Walker - 4 wheels (Will need to verify that pt has 4 wheeled RW)          Prior Functioning/Environment Level of Independence: Independent with assistive device(s);Independent        Comments: Pts spouse states that pt does not drive, but cooks and cleans.  He reports she has had several falls in the past 2 weeks, but none prior to that     OT Diagnosis: Generalized weakness;Cognitive deficits   OT Problem List: Decreased strength;Decreased activity tolerance;Impaired balance (sitting and/or standing);Decreased safety awareness;Decreased knowledge of use of DME or AE   OT Treatment/Interventions: Self-care/ADL training;DME and/or AE instruction;Therapeutic activities;Cognitive remediation/compensation;Patient/family education;Balance training    OT Goals(Current goals can be found in the care plan section) Acute Rehab OT Goals Patient Stated Goal: spouse would like pt to retun home when she is medically stable q OT Goal Formulation: With family Time For Goal Achievement: 10/28/14 Potential to Achieve Goals: Good ADL Goals Pt Will Perform Grooming: with min guard assist;standing Pt Will Perform Upper Body Bathing: with supervision;sitting Pt Will Perform Lower Body Bathing: with min guard assist;sit to/from stand Pt Will Perform Upper Body Dressing: with  supervision;sitting Pt Will Perform Lower Body Dressing: with min guard assist;sit to/from stand Pt Will Transfer to Toilet: with min guard assist;ambulating;regular height toilet;bedside commode;grab bars Pt Will Perform Toileting - Clothing Manipulation and hygiene: with min guard assist;sit to/from stand Pt Will Perform Tub/Shower Transfer: Tub transfer;with min assist;ambulating;tub bench;rolling walker  OT Frequency: Min 2X/week   Barriers to D/C:            Co-evaluation              End of Session Equipment Utilized During Treatment: Rolling walker Nurse Communication: Mobility status  Activity Tolerance: Patient limited by lethargy Patient left: in chair;with call bell/phone within reach;with chair alarm set;with family/visitor present   Time: 1610-9604 OT Time Calculation (min): 33 min Charges:  OT General Charges $OT Visit: 1 Procedure OT Evaluation $Initial OT Evaluation Tier I: 1 Procedure OT Treatments $Self Care/Home Management : 8-22 mins G-Codes:    Virgilia Quigg M 2014/10/26, 4:21 PM

## 2014-10-21 NOTE — Discharge Summary (Addendum)
Physician Discharge Summary  MCKAYLAH BETTENDORF MRN: [id number] DOB/AGE: 01-07-1951 64 y.o.  PCP: [name], MD   Admit date: 10/17/2014 Discharge date: 10/21/2014  Discharge Diagnoses:     Principal Problem:   Hypertensive emergency Active Problems:   HTN (hypertension)   End stage renal disease   Intracerebral bleed   Protein-calorie malnutrition, severe   Confusion  hypertensive encephalopathy  Addendum DC cancelled due to hypotension and somnolence  BP to be held for SBP<140 today  Follow-up recommendations Maintain systolic blood pressure less than 160 Avoid sedating medications such as narcotics, benzodiazepines, sleep medications Follow-up CBC, CMP, hemoglobin A1c at dialysis Follow-up with PCP in 5-7 days    Medication List    STOP taking these medications        oxyCODONE-acetaminophen 5-325 MG per tablet  Commonly known as:  PERCOCET/ROXICET      TAKE these medications        allopurinol 300 MG tablet  Commonly known as:  ZYLOPRIM  Take 300 mg by mouth daily.     docusate sodium 100 MG capsule  Commonly known as:  COLACE  Take 100 mg by mouth 2 (two) times daily.     ferrous sulfate dried 160 (50 FE) MG Tbcr SR tablet  Commonly known as:  SLOW FE  Take 640 mg by mouth daily.     hydrALAZINE 100 MG tablet  Commonly known as:  APRESOLINE  Take 100 mg by mouth 3 (three) times daily.     labetalol 300 MG tablet  Commonly known as:  NORMODYNE  Take 1 tablet (300 mg total) by mouth 3 (three) times daily.     levothyroxine 50 MCG tablet  Commonly known as:  SYNTHROID, LEVOTHROID  Take 1 tablet (50 mcg total) by mouth daily.     lidocaine-prilocaine cream  Commonly known as:  EMLA  Apply 1 application topically as needed (on Dialysis days Tues,Thurs.,Sat.).     Linaclotide 290 MCG Caps capsule  Commonly known as:  LINZESS  1 PO 30 MINS PRIOR TO BREAKFAST. IF O SATISFACTORY BM AFTER 2 WEEKS, TAKE WITH BREAKFAST.     NIFEdipine 20 MG  capsule  Commonly known as:  PROCARDIA  Take 60 mg by mouth 2 (two) times daily.     omeprazole 20 MG capsule  Commonly known as:  PRILOSEC  Take 1 capsule (20 mg total) by mouth daily.     sevelamer carbonate 800 MG tablet  Commonly known as:  RENVELA  Take 800-2,400 mg by mouth 3 (three) times daily with meals. Patient takes 1 tablet with snack and 3 tablets with meal     sodium bicarbonate 650 MG tablet  Take 1 tablet (650 mg total) by mouth 2 (two) times daily.        Discharge Condition: Stable   Disposition: Home with home health   Consults:  Neurology Nephrology    Significant Diagnostic Studies: Dg Chest 2 View  10/17/2014   CLINICAL DATA:  Renal failure and confusion  EXAM: CHEST  2 VIEW  COMPARISON:  10/14/2014  FINDINGS: Mild cardiomegaly and aortic tortuosity which stable from prior. Trace pleural effusions. There is no edema, consolidation, or pneumothorax.  IMPRESSION: Trace pleural effusions.   Electronically Signed   By: Marnee Spring M.D.   On: 10/17/2014 07:42   Dg Chest 2 View  10/14/2014   CLINICAL DATA:  Hypertension and dizziness  EXAM: CHEST  2 VIEW  COMPARISON:  09/17/2013  FINDINGS: There is chronic  moderate cardiomegaly and aortic tortuosity.  Trace left pleural effusion which is new. No edema or suspected pneumonia. No pneumothorax. No acute osseous findings.  IMPRESSION: 1. Trace left pleural effusion. 2. Stable cardiomegaly.   Electronically Signed   By: Monte Fantasia M.D.   On: 10/14/2014 16:24   Ct Head Wo Contrast  10/17/2014   CLINICAL DATA:  Dizziness and confusion. Recent left-sided external capsule and lentiform nucleus infarction and hemorrhage.  EXAM: CT HEAD WITHOUT CONTRAST  TECHNIQUE: Contiguous axial images were obtained from the base of the skull through the vertex without intravenous contrast.  COMPARISON:  10/15/2014  FINDINGS: Involving and stable appearance to infarction involving the posterior left basal ganglia and extending  to involve the posterior limb of the internal capsule and the adjacent external capsule. No new hemorrhage or mass effect identified. 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 No cortical infarction, hydrocephalus or extra-axial fluid collection identified. 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  IMPRESSION: No change in appearance of head CT since the most recent study. 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 No new hemorrhage or infarction identified. Stable encephalocele into left sphenoid sinus.   Electronically Signed   By: Aletta Edouard M.D.   On: 10/17/2014 07:39   Ct Head Wo Contrast  10/15/2014   CLINICAL DATA:  Abnormal MRI with dizziness and weakness for 1 month. Intracranial bleed.  EXAM: CT HEAD WITHOUT CONTRAST  TECHNIQUE: Contiguous axial images were obtained from the base of the skull through the vertex without intravenous contrast.  COMPARISON:  10/14/2014  FINDINGS: Skull and Sinuses:No acute findings.  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 Herniated temporal lobe is better seen on recent MRI imaging.  Orbits: No acute abnormality.  Brain: An isodense to hypodense hemorrhage in or below the posterior left putamen shows no visible enlargement. There is extensive chronic small vessel disease with confluent ischemic gliosis throughout the bilateral cerebral white matter and remote bilateral thalamic infarcts. Marked disease of the pons is better seen on previous MRI. No evidence of acute infarct, hydrocephalus, or shift.  IMPRESSION: 1. Stable appearance of subacute and isodense left basal ganglia hematoma. No acute hemorrhage. 2. Advanced chronic small vessel disease. 3. 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   Electronically Signed   By: Monte Fantasia M.D.   On: 10/15/2014 12:46   Ct Head Wo Contrast  10/14/2014   CLINICAL DATA:  Frequent falls for 1 month, elevated blood pressure and dizziness today, history hypertension, chronic kidney disease  EXAM: CT HEAD WITHOUT CONTRAST  TECHNIQUE: Contiguous axial images were obtained from the base of the skull through the vertex without intravenous contrast.  COMPARISON:  12/02/2008  FINDINGS: Normal ventricular morphology.  No midline shift or mass effect.  Extensive small vessel chronic ischemic changes of deep cerebral white matter.  No intracranial hemorrhage or evidence acute infarction.  No extra-axial fluid collection.  Mastoid air cells and middle ear cavities appear clear.  Fluid attenuation mass at identified at LEFT sphenoid sinus increased in size since previous exam now 2.4 x 3.4 cm.  This contains a 10 mm diameter soft tissue component laterally.  This appears to be associated with a defect in the medial wall of the LEFT middle cranial fossa.  Potentially  this could represent a small encephalocele with meninges and CSF protruding into the sphenoid sinus rather than a mucosal retention cyst.  Atherosclerotic calcifications of the carotid siphons.  Calvaria intact.  IMPRESSION: Extensive small vessel chronic ischemic changes of deep cerebral white matter.  Progressive LEFT sphenoid sinus opacification containing fluid components as well as a soft tissue nodular component laterally ; potentially this may represent herniation of meninges and a portion of the anterior LEFT temporal lobe/encephalocele through a defect in the wall the middle cranial fossa into the sphenoid sinus rather than a complicated mucosal retention cyst/mucocele of the sphenoid sinus.  MR imaging with and without contrast recommended for further assessment.  Findings called to Dr. Stark Jock on 10/14/2014 at 0530 hours.   Electronically Signed   By: Lavonia Dana M.D.   On: 10/14/2014 17:31   Mr [name] Nam Head Wo Contrast  10/14/2014   CLINICAL DATA:  Weakness and dizziness for 1 month. History of renal failure. Initial encounter.  EXAM: MRI HEAD WITHOUT CONTRAST  MRA HEAD WITHOUT CONTRAST  TECHNIQUE: Multiplanar, multiecho pulse sequences of the brain and surrounding structures were obtained without intravenous contrast. Angiographic images of the head were obtained using MRA technique without contrast.  COMPARISON:  CT head earlier today.  FINDINGS: MRI HEAD FINDINGS  There is a moderate sized area of restricted diffusion in the external capsule on the LEFT, slightly involving the lentiform nucleus and adjacent to the posterior limb internal capsule. Cross-sectional measurements are 11 x 15 mm. This lesion is hyperintense on T1, T2, and FLAIR imaging, with isointense central component on gradient sequence and peripheral T2 shortening. I believe this represents a subacute intracerebral hematoma which has become isodense on CT scan as the lesion is virtually inapparent. There is moderate surrounding vasogenic edema which accompanies extensive T2 and FLAIR hyperintensity throughout the remainder the white matter representing baseline chronic microvascular ischemic change.  The greater wing of the sphenoid on the LEFT is dehiscent. There is a gliotic nubbin of LEFT medial temporal lobe measuring 10 x 11 mm cross-section projecting into and expanded sphenoid sinus. Contained T2 hyperintense fluid as overall cross-sectional measurements considerably larger, measuring 33 x 25 mm cross-section. 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 Neurosurgical consultation is warranted.  Marked T2 hyperintensity accompanies the pons and midbrain likely extensive chronic microvascular ischemic change or. The no restricted diffusion is associated. Tiny focus of susceptibility in the RIGHT paramedian pons as seen on image 7 series 8 with slight  heterogeneity of signal, could also represent a subacute bleed. Multiple micro bleeds are seen throughout both cerebral and both cerebellar hemispheres of a chronic nature.  No sinus air-fluid level. Negative orbits. Trace BILATERAL mastoid effusions. Carotid, basilar, and vertebral flow voids are maintained.  MRA HEAD FINDINGS  RIGHT ICA: Moderate dolichoectasia of the petrous segment. Minor irregularity of the cavernous segment. Supraclinoid ICA tandem stenoses estimated 50-75%. Normal appearing ICA terminus.  LEFT ICA: Minor irregularity of the petrous and cavernous segments without flow reducing lesion. No supraclinoid or ICA terminus abnormality.  Basilar artery:  Minor irregularity without flow-limiting stenosis.  Vertebral arteries: RIGHT vertebral dominant. Severe stenosis of the distal LEFT vertebral at the basilar junction, estimated 90%.  Anterior cerebral arteries: Moderate irregularity on the RIGHT. LEFT A1 ACA dominant. No distal anterior cerebral stenosis.  Middle cerebral arteries: No proximal M1 stenosis on the RIGHT or LEFT. Moderate irregularity at the M2 and M3 segments consistent with intracranial atherosclerotic disease.  Posterior cerebral arteries: Minor  irregularity without flow reducing proximal lesion.  Cerebellar arteries: Moderately diseased LEFT AICA. LEFT PICA not visualized. Normal-appearing superior cerebellar arteries.  IMPRESSION: Suspect subacute LEFT external capsule intracerebral hematoma, 11 x 15 mm cross-section. 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 Contained CSF projects within the enlarged LEFT sphenoid sinus. Neurosurgical consultation is warranted.  Extensive chronic microvascular ischemic change throughout the cerebral hemispheres and brainstem.   Potentially flow reducing stenoses of the supraclinoid ICA, distal LEFT vertebral, and cerebellar vessels.   Electronically Signed   By: Rolla Flatten M.D.   On: 10/14/2014 19:58   Mr Brain Wo Contrast  10/19/2014   CLINICAL DATA:  Dizziness and confusion. Recent brain hemorrhage, now resolved. Subsequent encounter.  EXAM: MRI HEAD WITHOUT CONTRAST  TECHNIQUE: Multiplanar, multiecho pulse sequences of the brain and surrounding structures were obtained without intravenous contrast.  COMPARISON:  MR brain 10/14/2014.  CT head 10/17/2014.  FINDINGS: Redemonstrated is what is felt to be a slightly greater than 1 cm subacute intracerebral hematoma involving the LEFT lentiform nucleus and external capsule. Stable surrounding T1 and T2 hypointense rim.  Generalized atrophy with chronic microvascular ischemic change. No features suggestive of PRES.  Widely scattered microhemorrhages throughout the brain, likely sequelae of hypertensive cerebrovascular disease. These are also stable.  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 Small gliotic nubbin of temporal lobe 10 x 11 mm unchanged. 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  Stable flow voids in extracranial soft tissues.  IMPRESSION: Chronic changes as described with subacute LEFT basal ganglia hematoma, atrophy with chronic microvascular ischemic change, and scattered microhemorrhages. No features suggestive of acute hypertensive encephalopathy or PRES.  Stable LEFT greater sphenoid wing medially projecting encephalocele and meningocele.   Electronically Signed   By: Rolla Flatten M.D.   On: 10/19/2014 11:18   Mr Brain Wo Contrast  10/14/2014   CLINICAL DATA:  Weakness and dizziness for 1 month. History of renal failure. Initial encounter.  EXAM: MRI HEAD WITHOUT CONTRAST  MRA HEAD WITHOUT CONTRAST  TECHNIQUE: Multiplanar, multiecho pulse sequences of the brain and surrounding structures were obtained  without intravenous contrast. Angiographic images of the head were obtained using MRA technique without contrast.  COMPARISON:  CT head earlier today.  FINDINGS: MRI HEAD FINDINGS  There is a moderate sized area of restricted diffusion in the external capsule on the LEFT, slightly involving the lentiform nucleus and adjacent to the posterior limb internal capsule. Cross-sectional measurements are 11 x 15 mm. This lesion is hyperintense on T1, T2, and FLAIR imaging, with isointense central component on gradient sequence and peripheral T2 shortening. I believe this represents a subacute intracerebral hematoma which has become isodense on CT scan as the lesion is virtually inapparent. There is moderate surrounding vasogenic edema which accompanies extensive T2 and FLAIR hyperintensity throughout the remainder the white matter representing baseline chronic microvascular ischemic change.  The greater wing of the sphenoid on the LEFT is dehiscent. There is a gliotic nubbin of LEFT medial temporal lobe measuring 10 x 11 mm cross-section projecting into and expanded sphenoid sinus. Contained T2 hyperintense fluid as overall cross-sectional measurements considerably larger, measuring 33 x 25 mm cross-section. 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 Neurosurgical consultation is warranted.  Marked T2 hyperintensity accompanies the pons and midbrain likely extensive  chronic microvascular ischemic change or. The no restricted diffusion is associated. Tiny focus of susceptibility in the RIGHT paramedian pons as seen on image 7 series 8 with slight heterogeneity of signal, could also represent a subacute bleed. Multiple micro bleeds are seen throughout both cerebral and both cerebellar hemispheres of a chronic nature.  No sinus air-fluid level. Negative orbits. Trace BILATERAL mastoid effusions. Carotid, basilar, and vertebral flow voids are maintained.  MRA HEAD FINDINGS  RIGHT ICA:  Moderate dolichoectasia of the petrous segment. Minor irregularity of the cavernous segment. Supraclinoid ICA tandem stenoses estimated 50-75%. Normal appearing ICA terminus.  LEFT ICA: Minor irregularity of the petrous and cavernous segments without flow reducing lesion. No supraclinoid or ICA terminus abnormality.  Basilar artery:  Minor irregularity without flow-limiting stenosis.  Vertebral arteries: RIGHT vertebral dominant. Severe stenosis of the distal LEFT vertebral at the basilar junction, estimated 90%.  Anterior cerebral arteries: Moderate irregularity on the RIGHT. LEFT A1 ACA dominant. No distal anterior cerebral stenosis.  Middle cerebral arteries: No proximal M1 stenosis on the RIGHT or LEFT. Moderate irregularity at the M2 and M3 segments consistent with intracranial atherosclerotic disease.  Posterior cerebral arteries: Minor irregularity without flow reducing proximal lesion.  Cerebellar arteries: Moderately diseased LEFT AICA. LEFT PICA not visualized. Normal-appearing superior cerebellar arteries.  IMPRESSION: Suspect subacute LEFT external capsule intracerebral hematoma, 11 x 15 mm cross-section. 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 Contained CSF projects within the enlarged LEFT sphenoid sinus. Neurosurgical consultation is warranted.  Extensive chronic microvascular ischemic change throughout the cerebral hemispheres and brainstem.  Potentially flow reducing stenoses of the supraclinoid ICA, distal LEFT vertebral, and cerebellar vessels.   Electronically Signed   By: Rolla Flatten M.D.   On: 10/14/2014 19:58      Microbiology: Recent Results (from the past 240 hour(s))  MRSA PCR Screening     Status: None   Collection Time: 10/17/14 10:50 AM  Result Value Ref Range Status    MRSA by PCR NEGATIVE NEGATIVE Final    Comment:        The GeneXpert MRSA Assay (FDA approved for NASAL specimens only), is one component of a comprehensive MRSA colonization surveillance program. It is not intended to diagnose MRSA infection nor to guide or monitor treatment for MRSA infections.   Culture, Urine     Status: None   Collection Time: 10/19/14  1:16 AM  Result Value Ref Range Status   Specimen Description URINE, RANDOM  Final   Special Requests NONE  Final   Colony Count   Final    >=100,000 COLONIES/ML Performed at [facility]    Culture   Final    Multiple bacterial morphotypes present, none predominant. Suggest appropriate recollection if clinically indicated. Performed at [facility]    Report Status 10/20/2014 FINAL  Final     Labs: Results for orders placed or performed during the hospital encounter of 10/17/14 (from the past 48 hour(s))  Comprehensive metabolic panel     Status: Abnormal   Collection Time: 10/19/14  7:00 PM  Result Value Ref Range   Sodium 133 (L) 135 - 145 mmol/L   Potassium 4.1 3.5 - 5.1 mmol/L   Chloride 96 96 - 112 mmol/L   CO2 28 19 - 32 mmol/L   Glucose, Bld 82 70 - 99 mg/dL  BUN 19 6 - 23 mg/dL   Creatinine, Ser 7.37 (H) 0.50 - 1.10 mg/dL   Calcium 9.6 8.4 - 10.5 mg/dL   Total Protein 6.2 6.0 - 8.3 g/dL   Albumin 3.2 (L) 3.5 - 5.2 g/dL   AST 13 0 - 37 U/L   ALT 10 0 - 35 U/L   Alkaline Phosphatase 31 (L) 39 - 117 U/L   Total Bilirubin 0.6 0.3 - 1.2 mg/dL   GFR calc non Af Amer 5 (L) >90 mL/min   GFR calc Af Amer 6 (L) >90 mL/min    Comment: (NOTE) The eGFR has been calculated using the CKD EPI equation. This calculation has not been validated in all clinical situations. eGFR's persistently <90 mL/min signify possible Chronic Kidney Disease.    Anion gap 9 5 - 15  Phosphorus     Status: Abnormal   Collection Time: 10/19/14  7:00 PM  Result Value Ref Range   Phosphorus 5.1 (H) 2.3 - 4.6 mg/dL   CBC     Status: Abnormal   Collection Time: 10/19/14  7:00 PM  Result Value Ref Range   WBC 3.6 (L) 4.0 - 10.5 K/uL   RBC 3.66 (L) 3.87 - 5.11 MIL/uL   Hemoglobin 8.3 (L) 12.0 - 15.0 g/dL   HCT 27.0 (L) 36.0 - 46.0 %   MCV 73.8 (L) 78.0 - 100.0 fL   MCH 22.7 (L) 26.0 - 34.0 pg   MCHC 30.7 30.0 - 36.0 g/dL   RDW 20.3 (H) 11.5 - 15.5 %   Platelets 178 150 - 400 K/uL    Comment: SPECIMEN CHECKED FOR CLOTS PLATELET COUNT CONFIRMED BY SMEAR FEW SMALL PLATELET CLUMPS NOTED ON SMEAR   Vitamin B12     Status: None   Collection Time: 10/20/14  1:25 PM  Result Value Ref Range   Vitamin B-12 549 211 - 911 pg/mL    Comment: Performed at [facility]  Ammonia     Status: None   Collection Time: 10/20/14  1:25 PM  Result Value Ref Range   Ammonia 20 11 - 32 umol/L  Glucose, capillary     Status: None   Collection Time: 10/20/14  4:50 PM  Result Value Ref Range   Glucose-Capillary 82 70 - 99 mg/dL  Glucose, capillary     Status: Abnormal   Collection Time: 10/20/14  8:42 PM  Result Value Ref Range   Glucose-Capillary 104 (H) 70 - 99 mg/dL  Glucose, capillary     Status: None   Collection Time: 10/21/14 12:25 AM  Result Value Ref Range   Glucose-Capillary 97 70 - 99 mg/dL   Comment 1 Notify RN    Comment 2 Document in Chart   Glucose, capillary     Status: None   Collection Time: 10/21/14  4:16 AM  Result Value Ref Range   Glucose-Capillary 89 70 - 99 mg/dL   Comment 1 Notify RN    Comment 2 Document in Chart   Glucose, capillary     Status: None   Collection Time: 10/21/14  8:25 AM  Result Value Ref Range   Glucose-Capillary 97 70 - 99 mg/dL  Glucose, capillary     Status: Abnormal   Collection Time: 10/21/14 11:23 AM  Result Value Ref Range   Glucose-Capillary 194 (H) 70 - 99 mg/dL    Summary: 63 year old woman presented with "confusion", present when she woke up 3/17 in preparation for dialysis. Had difficulty putting her teeth and. Noted to have  significantly  high blood pressure was systolic in the 188C, treated with labetalol infusion. Per admitting physician's note systolic blood pressure greater than 180 was noted on previous hospitalization. Admitted for hypertensive emergency with acute encephalopathy. No confusion was noted by admitting physician, however nursing noted confusion overnight.  Discharge from the hospital 3/16 after being diagnosed with intracranial hemorrhage and hypertensive emergency. According to chart, neurosurgery recommended no intervention. Outpatient follow-up was recommended.  Assessment/Plan:  Accelerated HTN/HTN blood pressure better controlled , resume home medications, neurology feels that this is primarily contributing to her altered mental status secondary to hypertensive encephalopathy. Neurology recommends Goal SBP <160. No focal neuro deficits. By chart review it appears that her blood pressure responds to hemodialysis.    Acute encephalopathy. 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 Patient sleepy but not confused prior to discharge. Could recall her home address her birthday her husband's name and date of birth. Afebrile, no signs or symptoms to suggest infection. UA negative.. Chest x-ray without acute abnormalities. Suspect encephalopathy related to recent hemorrhage VS underlying dementia. Vitamin B-12 ammonia level within normal limits. Family refusing SNF, will arrange for home health   3. End-stage renal disease. Appreciate nephrology input, Dr Deterding ,TTS at [place]    4. Anemia of chronic disease. Per nephrology   5. Intracerebral bleed 3/14. 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 Per Dr. Jerilee Hoh: "Neurosurgery, Dr. Arnoldo Morale, reviewed MRI at that time and opined no surgical intervention was warranted. MRI shows.Infarct left basal ganglia extending to the posterior  limb of the internal capsule, stable per radiology. No antiplatelet therapy secondary to intracranial bleeding   6. Moderately large encephalocele and meningocele. Outpatient follow with neurosurgery planned.   7. Severe protein calorie malnutrition.           Discharge Exam:    Blood pressure 120/39, pulse 74, temperature 98.5 F (36.9 C), temperature source Oral, resp. rate 18, height $RemoveBe'5\' 8"'[id number]$  (1.727 m), weight 60.1 kg (132 lb 7.9 oz), SpO2 99 %.    General: Appears calm but confused,. Nontoxic.  Eyes: PERRL, normal lids, irises   ENT: grossly normal hearing, lips & tongue  Cardiovascular: RRR, no m/r/g. No LE edema.  Telemetry: SR, no arrhythmias   Respiratory: CTA bilaterally, no w/r/r. Normal respiratory effort.  Abdomen: soft, ntnd  Skin: no rash or induration seen   Musculoskeletal: grossly normal tone BUE/BLE; exam limited only follows commands intermittently, able to lift both arms and legs.  Psychiatric: Confused. Oriented to location but not month or year. Speech is intelligible, fluent but not always appropriate.  Neurologic: Exam limited by confusion but cranial nerves appear intact. No definite pronator drift. No focal neuro deficits noted.        Discharge Instructions    Diet - low sodium heart healthy    Complete by:  As directed      Increase activity slowly    Complete by:  As directed            Follow-up Information    Follow up with [name], MD. Schedule an appointment as soon as possible for a visit in 1 week.   Specialty:  Internal Medicine   Contact information:   [address] [place] [address] [phone]       Signed: Reyne Dumas 10/21/2014, 1:44 PM

## 2014-10-21 NOTE — Progress Notes (Signed)
Pt a bit more alert now family members at bed side pt sitting in a chair Bp has improved.

## 2014-10-21 NOTE — Progress Notes (Signed)
Subjective: Patient is alert and oriented.  Husband at bedside feels she is drowsy. BP has been well controled.   Objective: Current vital signs: BP 120/39 mmHg  Pulse 74  Temp(Src) 98.5 F (36.9 C) (Oral)  Resp 18  Ht 5\' 8"  (1.727 m)  Wt 60.1 kg (132 lb 7.9 oz)  BMI 20.15 kg/m2  SpO2 99% Vital signs in last 24 hours: Temp:  [98.2 F (36.8 C)-98.5 F (36.9 C)] 98.5 F (36.9 C) (03/21 0925) Pulse Rate:  [74-80] 74 (03/21 0604) Resp:  [18] 18 (03/21 0925) BP: (120-208)/(39-84) 120/39 mmHg (03/21 0925) SpO2:  [97 %-100 %] 99 % (03/21 0925)  Intake/Output from previous day: 03/20 0701 - 03/21 0700 In: 480 [P.O.:480] Out: -  Intake/Output this shift:   Nutritional status: Diet renal/carb modified with 1200 ml fluid restriction  Neurologic Exam:  Mental Status: Alert, oriented, thought content appropriate.  Speech fluent without evidence of aphasia.  Able to follow 3 step commands without difficulty. Cranial Nerves: II: Visual fields grossly normal, pupils equal, round, reactive to light and accommodation III,IV, VI: ptosis not present, extra-ocular motions intact bilaterally V,VII: smile symmetric, facial light touch sensation normal bilaterally VIII: hearing normal bilaterally IX,X: uvula rises symmetrically XI: bilateral shoulder shrug XII: midline tongue extension without atrophy or fasciculations  Motor: Right : Upper extremity   5/5    Left:     Upper extremity   5/5  Lower extremity   5/5     Lower extremity   5/5 Tone and bulk:normal tone throughout; no atrophy noted Sensory: Pinprick and light touch intact throughout, bilaterally Deep Tendon Reflexes:  1+ throughout  Plantars: Right: downgoing   Left: downgoing    Lab Results: Basic Metabolic Panel:  Recent Labs Lab 10/14/14 1608 10/15/14 0253 10/15/14 0254 10/17/14 0707 10/18/14 0829 10/18/14 0839 10/19/14 1900  NA 137 138  --  136 137  --  133*  K 3.0* 3.0*  --  3.9 4.4  --  4.1  CL 100 101   --  99 98  --  96  CO2 28 27  --  29 29  --  28  GLUCOSE 106* 95  --  100* 100*  --  82  BUN 21 23  --  18 12  --  19  CREATININE 7.56* 8.01*  --  6.96* 4.41*  --  7.37*  CALCIUM 9.5 8.8  --  9.3 9.4  --  9.6  MG  --   --  1.8  --   --   --   --   PHOS  --   --   --   --   --  3.3 5.1*    Liver Function Tests:  Recent Labs Lab 10/14/14 1608 10/15/14 0253 10/19/14 1900  AST 16 12 13   ALT 9 7 10   ALKPHOS 30* 28* 31*  BILITOT 0.5 0.4 0.6  PROT 6.9 5.8* 6.2  ALBUMIN 3.5 2.9* 3.2*   No results for input(s): LIPASE, AMYLASE in the last 168 hours.  Recent Labs Lab 10/20/14 1325  AMMONIA 20    CBC:  Recent Labs Lab 10/14/14 1608 10/15/14 0253 10/17/14 0707 10/18/14 0829 10/19/14 1900  WBC 3.9* 4.0 3.1* 3.5* 3.6*  NEUTROABS 2.0  --  1.4*  --   --   HGB 8.6* 7.9* 8.6* 9.3* 8.3*  HCT 29.1* 25.5* 28.4* 30.9* 27.0*  MCV 76.0* 74.8* 75.1* 73.9* 73.8*  PLT 191 176 175 177 178    Cardiac Enzymes: No  results for input(s): CKTOTAL, CKMB, CKMBINDEX, TROPONINI in the last 168 hours.  Lipid Panel: No results for input(s): CHOL, TRIG, HDL, CHOLHDL, VLDL, LDLCALC in the last 168 hours.  CBG:  Recent Labs Lab 10/18/14 1959 10/20/14 1650 10/20/14 2042 10/21/14 0025 10/21/14 0416  GLUCAP 87 82 104* 97 57    Microbiology: Results for orders placed or performed during the hospital encounter of 10/17/14  MRSA PCR Screening     Status: None   Collection Time: 10/17/14 10:50 AM  Result Value Ref Range Status   MRSA by PCR NEGATIVE NEGATIVE Final    Comment:        The GeneXpert MRSA Assay (FDA approved for NASAL specimens only), is one component of a comprehensive MRSA colonization surveillance program. It is not intended to diagnose MRSA infection nor to guide or monitor treatment for MRSA infections.   Culture, Urine     Status: None   Collection Time: 10/19/14  1:16 AM  Result Value Ref Range Status   Specimen Description URINE, RANDOM  Final   Special  Requests NONE  Final   Colony Count   Final    >=100,000 COLONIES/ML Performed at [facility]    Culture   Final    Multiple bacterial morphotypes present, none predominant. Suggest appropriate recollection if clinically indicated. Performed at [facility]    Report Status 10/20/2014 FINAL  Final    Coagulation Studies: No results for input(s): LABPROT, INR in the last 72 hours.  Imaging: No results found.  Medications:  Scheduled: . allopurinol  300 mg Oral Daily  . amLODipine  10 mg Oral QHS  . [START ON 10/22/2014] darbepoetin (ARANESP) injection - DIALYSIS  100 mcg Intravenous Q Tue-HD  . docusate sodium  100 mg Oral BID  . [START ON 10/22/2014] doxercalciferol  5 mcg Intravenous Q T,Th,Sa-HD  . [START ON 10/24/2014] ferric gluconate (FERRLECIT/NULECIT) IV  62.5 mg Intravenous Q Thu-HD  . hydrALAZINE  100 mg Oral 3 times per day  . insulin aspart  0-9 Units Subcutaneous 6 times per day  . labetalol  300 mg Oral TID  . levothyroxine  50 mcg Oral QAC breakfast  . Linaclotide  290 mcg Oral q morning - 10a  . multivitamin  1 tablet Oral QHS  . pantoprazole  40 mg Oral Daily  . sevelamer carbonate  2,400 mg Oral TID WC    Assessment/Plan:  64 year old female with altered mental status.Etiology unclear.Suspect it is likely multi-factorial. Suspect a component of hypertensive encephalopathy. Mental status has improved. No further neurology recommendations.       Etta Quill PA-C [facility] [phone]  10/21/2014, 10:53 AM

## 2014-10-21 NOTE — Progress Notes (Addendum)
Received report that pt for possible D/c home this pm. Pt awake but drowsy and confused with place time and month. Noted extreme genelraized weakness and Low BP husband at bedside and had concern about the discharge  MD Abrol made aware and discharge home discontinued. New orders given. RN  will continue to monitor pt.

## 2014-10-22 ENCOUNTER — Inpatient Hospital Stay (HOSPITAL_COMMUNITY): Payer: Medicare Other

## 2014-10-22 LAB — CBC WITH DIFFERENTIAL/PLATELET
Basophils Absolute: 0 10*3/uL (ref 0.0–0.1)
Basophils Relative: 1 % (ref 0–1)
Eosinophils Absolute: 0.1 10*3/uL (ref 0.0–0.7)
Eosinophils Relative: 4 % (ref 0–5)
HCT: 25.8 % — ABNORMAL LOW (ref 36.0–46.0)
Hemoglobin: 8.3 g/dL — ABNORMAL LOW (ref 12.0–15.0)
LYMPHS ABS: 2.1 10*3/uL (ref 0.7–4.0)
Lymphocytes Relative: 59 % — ABNORMAL HIGH (ref 12–46)
MCH: 23 pg — AB (ref 26.0–34.0)
MCHC: 32.2 g/dL (ref 30.0–36.0)
MCV: 71.5 fL — ABNORMAL LOW (ref 78.0–100.0)
MONO ABS: 0.5 10*3/uL (ref 0.1–1.0)
Monocytes Relative: 13 % — ABNORMAL HIGH (ref 3–12)
Neutro Abs: 0.8 10*3/uL — ABNORMAL LOW (ref 1.7–7.7)
Neutrophils Relative %: 23 % — ABNORMAL LOW (ref 43–77)
PLATELETS: 163 10*3/uL (ref 150–400)
RBC: 3.61 MIL/uL — ABNORMAL LOW (ref 3.87–5.11)
RDW: 19.2 % — ABNORMAL HIGH (ref 11.5–15.5)
WBC: 3.5 10*3/uL — AB (ref 4.0–10.5)

## 2014-10-22 LAB — GLUCOSE, CAPILLARY
GLUCOSE-CAPILLARY: 109 mg/dL — AB (ref 70–99)
GLUCOSE-CAPILLARY: 89 mg/dL (ref 70–99)
GLUCOSE-CAPILLARY: 90 mg/dL (ref 70–99)
GLUCOSE-CAPILLARY: 93 mg/dL (ref 70–99)
GLUCOSE-CAPILLARY: 98 mg/dL (ref 70–99)
Glucose-Capillary: 89 mg/dL (ref 70–99)
Glucose-Capillary: 117 mg/dL — ABNORMAL HIGH (ref 70–99)

## 2014-10-22 LAB — RENAL FUNCTION PANEL
Albumin: 3.1 g/dL — ABNORMAL LOW (ref 3.5–5.2)
Anion gap: 13 (ref 5–15)
BUN: 28 mg/dL — ABNORMAL HIGH (ref 6–23)
CALCIUM: 9.8 mg/dL (ref 8.4–10.5)
CO2: 28 mmol/L (ref 19–32)
CREATININE: 7.46 mg/dL — AB (ref 0.50–1.10)
Chloride: 88 mmol/L — ABNORMAL LOW (ref 96–112)
GFR calc Af Amer: 6 mL/min — ABNORMAL LOW (ref >90)
GFR calc non Af Amer: 5 mL/min — ABNORMAL LOW (ref >90)
GLUCOSE: 96 mg/dL (ref 70–99)
Phosphorus: 4.4 mg/dL (ref 2.3–4.6)
Potassium: 3.9 mmol/L (ref 3.5–5.1)
Sodium: 129 mmol/L — ABNORMAL LOW (ref 135–145)

## 2014-10-22 MED ORDER — DARBEPOETIN ALFA 100 MCG/0.5ML IJ SOSY
PREFILLED_SYRINGE | INTRAMUSCULAR | Status: AC
  Administered 2014-10-22: 100 ug via INTRAVENOUS
  Filled 2014-10-22: qty 0.5

## 2014-10-22 MED ORDER — LIDOCAINE-PRILOCAINE 2.5-2.5 % EX CREA
1.0000 "application " | TOPICAL_CREAM | CUTANEOUS | Status: DC | PRN
  Filled 2014-10-22: qty 5

## 2014-10-22 MED ORDER — SODIUM CHLORIDE 0.9 % IV SOLN: 100.0000 mL | INTRAVENOUS | Status: DC | PRN

## 2014-10-22 MED ORDER — HEPARIN SODIUM (PORCINE) 1000 UNIT/ML DIALYSIS: 1000.0000 [IU] | INTRAMUSCULAR | Status: DC | PRN

## 2014-10-22 MED ORDER — BOOST / RESOURCE BREEZE PO LIQD
1.0000 | ORAL | Status: DC
  Administered 2014-10-23: 1 via ORAL

## 2014-10-22 MED ORDER — LIDOCAINE HCL (PF) 1 % IJ SOLN: 5.0000 mL | INTRAMUSCULAR | Status: DC | PRN

## 2014-10-22 MED ORDER — HYDRALAZINE HCL 50 MG PO TABS: 50.0000 mg | ORAL_TABLET | Freq: Three times a day (TID) | ORAL | Status: DC

## 2014-10-22 MED ORDER — PENTAFLUOROPROP-TETRAFLUOROETH EX AERO: 1.0000 "application " | INHALATION_SPRAY | CUTANEOUS | Status: DC | PRN

## 2014-10-22 MED ORDER — ALTEPLASE 2 MG IJ SOLR
2.0000 mg | Freq: Once | INTRAMUSCULAR | Status: DC | PRN
  Filled 2014-10-22: qty 2

## 2014-10-22 MED ORDER — NEPRO/CARBSTEADY PO LIQD
237.0000 mL | ORAL | Status: DC | PRN
  Filled 2014-10-22: qty 237

## 2014-10-22 MED ORDER — ENSURE ENLIVE PO LIQD: 237.0000 mL | ORAL | Status: DC

## 2014-10-22 MED ORDER — DOXERCALCIFEROL 4 MCG/2ML IV SOLN
INTRAVENOUS | Status: AC
  Administered 2014-10-22: 5 ug via INTRAVENOUS
  Filled 2014-10-22: qty 4

## 2014-10-22 MED ORDER — NEPRO/CARBSTEADY PO LIQD: 237.0000 mL | ORAL | Status: DC | PRN

## 2014-10-22 NOTE — Progress Notes (Signed)
Family arrived to pt's room around 17:30. Husband and daughter asked RN about pt's d/c and RN explained that pt will need 24 assistance/supervision at home per PT/OT. Family stated that they could not provide 24 assistance and that they would like the pt to d/c to SNF for rehab. RN passed information on to night RN for CSW tomorrow.

## 2014-10-22 NOTE — Progress Notes (Addendum)
Physician Discharge Summary  Sonya [name] MRN: [id number] DOB/AGE: Sep 05, 1950 64 y.o.  PCP: [name], MD   Admit date: 10/17/2014 Discharge date: 10/22/2014  Discharge Diagnoses:     Principal Problem:   Hypertensive emergency Active Problems:   HTN (hypertension)   End stage renal disease   Intracerebral bleed   Protein-calorie malnutrition, severe   Confusion  hypertensive encephalopathy     Follow-up recommendations Maintain systolic blood pressure  130-160 Avoid sedating medications such as narcotics, benzodiazepines, sleep medications Follow-up CBC, CMP, hemoglobin A1c at dialysis Follow-up with PCP in 5-7 days    Medication List    STOP taking these medications        NIFEdipine 20 MG capsule  Commonly known as:  PROCARDIA     oxyCODONE-acetaminophen 5-325 MG per tablet  Commonly known as:  PERCOCET/ROXICET      TAKE these medications        allopurinol 300 MG tablet  Commonly known as:  ZYLOPRIM  Take 300 mg by mouth daily.     docusate sodium 100 MG capsule  Commonly known as:  COLACE  Take 100 mg by mouth 2 (two) times daily.     feeding supplement (NEPRO CARB STEADY) Liqd  Take 237 mLs by mouth as needed (missed meal during dialysis.).     ferrous sulfate dried 160 (50 FE) MG Tbcr SR tablet  Commonly known as:  SLOW FE  Take 640 mg by mouth daily.     hydrALAZINE 50 MG tablet  Commonly known as:  APRESOLINE  Take 1 tablet (50 mg total) by mouth every 8 (eight) hours.     labetalol 300 MG tablet  Commonly known as:  NORMODYNE  Take 1 tablet (300 mg total) by mouth 3 (three) times daily.     levothyroxine 50 MCG tablet  Commonly known as:  SYNTHROID, LEVOTHROID  Take 1 tablet (50 mcg total) by mouth daily.     lidocaine-prilocaine cream  Commonly known as:  EMLA  Apply 1 application topically as needed (on Dialysis days Tues,Thurs.,Sat.).     Linaclotide 290 MCG Caps capsule  Commonly known as:  LINZESS  1 PO 30 MINS  PRIOR TO BREAKFAST. IF O SATISFACTORY BM AFTER 2 WEEKS, TAKE WITH BREAKFAST.     omeprazole 20 MG capsule  Commonly known as:  PRILOSEC  Take 1 capsule (20 mg total) by mouth daily.     sevelamer carbonate 800 MG tablet  Commonly known as:  RENVELA  Take 800-2,400 mg by mouth 3 (three) times daily with meals. Patient takes 1 tablet with snack and 3 tablets with meal     sodium bicarbonate 650 MG tablet  Take 1 tablet (650 mg total) by mouth 2 (two) times daily.          Discharge Condition: Stable   Disposition: Home with home health   Consults:  Neurology Nephrology    Significant Diagnostic Studies: Dg Chest 2 View  10/17/2014   CLINICAL DATA:  Renal failure and confusion  EXAM: CHEST  2 VIEW  COMPARISON:  10/14/2014  FINDINGS: Mild cardiomegaly and aortic tortuosity which stable from prior. Trace pleural effusions. There is no edema, consolidation, or pneumothorax.  IMPRESSION: Trace pleural effusions.   Electronically Signed   By: Marnee Spring M.D.   On: 10/17/2014 07:42   Dg Chest 2 View  10/14/2014   CLINICAL DATA:  Hypertension and dizziness  EXAM: CHEST  2 VIEW  COMPARISON:  09/17/2013  FINDINGS: There  is chronic moderate cardiomegaly and aortic tortuosity.  Trace left pleural effusion which is new. No edema or suspected pneumonia. No pneumothorax. No acute osseous findings.  IMPRESSION: 1. Trace left pleural effusion. 2. Stable cardiomegaly.   Electronically Signed   By: Monte Fantasia M.D.   On: 10/14/2014 16:24   Ct Head Wo Contrast  10/17/2014   CLINICAL DATA:  Dizziness and confusion. Recent left-sided external capsule and lentiform nucleus infarction and hemorrhage.  EXAM: CT HEAD WITHOUT CONTRAST  TECHNIQUE: Contiguous axial images were obtained from the base of the skull through the vertex without intravenous contrast.  COMPARISON:  10/15/2014  FINDINGS: Involving and stable appearance to infarction involving the posterior left basal ganglia and extending to  involve the posterior limb of the internal capsule and the adjacent external capsule. No new hemorrhage or mass effect identified. 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 No cortical infarction, hydrocephalus or extra-axial fluid collection identified. 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  IMPRESSION: No change in appearance of head CT since the most recent study. 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 No new hemorrhage or infarction identified. Stable encephalocele into left sphenoid sinus.   Electronically Signed   By: Aletta Edouard M.D.   On: 10/17/2014 07:39   Ct Head Wo Contrast  10/15/2014   CLINICAL DATA:  Abnormal MRI with dizziness and weakness for 1 month. Intracranial bleed.  EXAM: CT HEAD WITHOUT CONTRAST  TECHNIQUE: Contiguous axial images were obtained from the base of the skull through the vertex without intravenous contrast.  COMPARISON:  10/14/2014  FINDINGS: Skull and Sinuses:No acute findings.  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 Herniated temporal lobe is better seen on recent MRI imaging.  Orbits: No acute abnormality.  Brain: An isodense to hypodense hemorrhage in or below the posterior left putamen shows no visible enlargement. There is extensive chronic small vessel disease with confluent ischemic gliosis throughout the bilateral cerebral white matter and remote bilateral thalamic infarcts. Marked disease of the pons is better seen on previous MRI. No evidence of acute infarct, hydrocephalus, or shift.  IMPRESSION: 1. Stable appearance of subacute and isodense left basal ganglia hematoma. No acute hemorrhage. 2. Advanced chronic small vessel disease. 3. 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   Electronically Signed   By: Monte Fantasia M.D.   On: 10/15/2014 12:46   Ct Head Wo Contrast  10/14/2014   CLINICAL DATA:  Frequent falls for 1 month, elevated blood pressure and dizziness today, history hypertension, chronic kidney disease  EXAM: CT HEAD WITHOUT CONTRAST  TECHNIQUE: Contiguous axial images were obtained from the base of the skull through the vertex without intravenous contrast.  COMPARISON:  12/02/2008  FINDINGS: Normal ventricular morphology.  No midline shift or mass effect.  Extensive small vessel chronic ischemic changes of deep cerebral white matter.  No intracranial hemorrhage or evidence acute infarction.  No extra-axial fluid collection.  Mastoid air cells and middle ear cavities appear clear.  Fluid attenuation mass at identified at LEFT sphenoid sinus increased in size since previous exam now 2.4 x 3.4 cm.  This contains a 10 mm diameter soft tissue component laterally.  This appears to be associated with a defect in the medial wall of the LEFT middle cranial fossa.  Potentially this could represent a small encephalocele with meninges and CSF protruding into the sphenoid sinus rather than a mucosal retention cyst.  Atherosclerotic calcifications of the carotid siphons.  Calvaria intact.  IMPRESSION: Extensive small vessel chronic ischemic changes of deep cerebral white matter.  Progressive LEFT sphenoid sinus opacification containing fluid components as well as a soft tissue nodular component laterally ; potentially this may represent herniation of meninges and a portion of the anterior LEFT temporal lobe/encephalocele through a defect in the wall the middle cranial fossa into the sphenoid sinus rather than a complicated mucosal retention cyst/mucocele of the sphenoid sinus.  MR imaging with and without contrast recommended for further assessment.  Findings called to Dr. Stark Jock on 10/14/2014 at 0530 hours.   Electronically Signed   By: Lavonia Dana M.D.   On: 10/14/2014 17:31   Mr [name] Nam Head Wo Contrast  10/14/2014   CLINICAL DATA:  Weakness and dizziness for 1 month. History of renal failure. Initial encounter.  EXAM: MRI HEAD WITHOUT CONTRAST  MRA HEAD WITHOUT CONTRAST  TECHNIQUE: Multiplanar, multiecho pulse sequences of the brain and surrounding structures were obtained without intravenous contrast. Angiographic images of the head were obtained using MRA technique without contrast.  COMPARISON:  CT head earlier today.  FINDINGS: MRI HEAD FINDINGS  There is a moderate sized area of restricted diffusion in the external capsule on the LEFT, slightly involving the lentiform nucleus and adjacent to the posterior limb internal capsule. Cross-sectional measurements are 11 x 15 mm. This lesion is hyperintense on T1, T2, and FLAIR imaging, with isointense central component on gradient sequence and peripheral T2 shortening. I believe this represents a subacute intracerebral hematoma which has become isodense on CT scan as the lesion is virtually inapparent. There is moderate surrounding vasogenic edema which accompanies extensive T2 and FLAIR hyperintensity throughout the remainder the white matter representing baseline chronic microvascular ischemic change.  The greater wing of the sphenoid on the LEFT is dehiscent. There is a gliotic nubbin of LEFT medial temporal lobe measuring 10 x 11 mm cross-section projecting into and expanded sphenoid sinus. Contained T2 hyperintense fluid as overall cross-sectional measurements considerably larger, measuring 33 x 25 mm cross-section. 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 Neurosurgical consultation is warranted.  Marked T2 hyperintensity accompanies the pons and midbrain likely extensive chronic microvascular ischemic change or. The no restricted diffusion is associated. Tiny focus of susceptibility in the RIGHT paramedian pons as seen on image 7 series 8 with slight  heterogeneity of signal, could also represent a subacute bleed. Multiple micro bleeds are seen throughout both cerebral and both cerebellar hemispheres of a chronic nature.  No sinus air-fluid level. Negative orbits. Trace BILATERAL mastoid effusions. Carotid, basilar, and vertebral flow voids are maintained.  MRA HEAD FINDINGS  RIGHT ICA: Moderate dolichoectasia of the petrous segment. Minor irregularity of the cavernous segment. Supraclinoid ICA tandem stenoses estimated 50-75%. Normal appearing ICA terminus.  LEFT ICA: Minor irregularity of the petrous and cavernous segments without flow reducing lesion. No supraclinoid or ICA terminus abnormality.  Basilar artery:  Minor irregularity without flow-limiting stenosis.  Vertebral arteries: RIGHT vertebral dominant. Severe stenosis of the distal LEFT vertebral at the basilar junction, estimated 90%.  Anterior cerebral arteries: Moderate irregularity on the RIGHT. LEFT A1 ACA dominant. No distal anterior cerebral stenosis.  Middle cerebral arteries: No proximal M1 stenosis on the RIGHT or LEFT. Moderate irregularity at the M2 and M3 segments consistent with intracranial atherosclerotic disease.  Posterior cerebral arteries:  Minor irregularity without flow reducing proximal lesion.  Cerebellar arteries: Moderately diseased LEFT AICA. LEFT PICA not visualized. Normal-appearing superior cerebellar arteries.  IMPRESSION: Suspect subacute LEFT external capsule intracerebral hematoma, 11 x 15 mm cross-section. 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 Contained CSF projects within the enlarged LEFT sphenoid sinus. Neurosurgical consultation is warranted.  Extensive chronic microvascular ischemic change throughout the cerebral hemispheres and brainstem.   Potentially flow reducing stenoses of the supraclinoid ICA, distal LEFT vertebral, and cerebellar vessels.   Electronically Signed   By: Rolla Flatten M.D.   On: 10/14/2014 19:58   Mr Brain Wo Contrast  10/19/2014   CLINICAL DATA:  Dizziness and confusion. Recent brain hemorrhage, now resolved. Subsequent encounter.  EXAM: MRI HEAD WITHOUT CONTRAST  TECHNIQUE: Multiplanar, multiecho pulse sequences of the brain and surrounding structures were obtained without intravenous contrast.  COMPARISON:  MR brain 10/14/2014.  CT head 10/17/2014.  FINDINGS: Redemonstrated is what is felt to be a slightly greater than 1 cm subacute intracerebral hematoma involving the LEFT lentiform nucleus and external capsule. Stable surrounding T1 and T2 hypointense rim.  Generalized atrophy with chronic microvascular ischemic change. No features suggestive of PRES.  Widely scattered microhemorrhages throughout the brain, likely sequelae of hypertensive cerebrovascular disease. These are also stable.  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 Small gliotic nubbin of temporal lobe 10 x 11 mm unchanged. 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  Stable flow voids in extracranial soft tissues.  IMPRESSION: Chronic changes as described with subacute LEFT basal ganglia hematoma, atrophy with chronic microvascular ischemic change, and scattered microhemorrhages. No features suggestive of acute hypertensive encephalopathy or PRES.  Stable LEFT greater sphenoid wing medially projecting encephalocele and meningocele.   Electronically Signed   By: Rolla Flatten M.D.   On: 10/19/2014 11:18   Mr Brain Wo Contrast  10/14/2014   CLINICAL DATA:  Weakness and dizziness for 1 month. History of renal failure. Initial encounter.  EXAM: MRI HEAD WITHOUT CONTRAST  MRA HEAD WITHOUT CONTRAST  TECHNIQUE: Multiplanar, multiecho pulse sequences of the brain and surrounding structures were obtained  without intravenous contrast. Angiographic images of the head were obtained using MRA technique without contrast.  COMPARISON:  CT head earlier today.  FINDINGS: MRI HEAD FINDINGS  There is a moderate sized area of restricted diffusion in the external capsule on the LEFT, slightly involving the lentiform nucleus and adjacent to the posterior limb internal capsule. Cross-sectional measurements are 11 x 15 mm. This lesion is hyperintense on T1, T2, and FLAIR imaging, with isointense central component on gradient sequence and peripheral T2 shortening. I believe this represents a subacute intracerebral hematoma which has become isodense on CT scan as the lesion is virtually inapparent. There is moderate surrounding vasogenic edema which accompanies extensive T2 and FLAIR hyperintensity throughout the remainder the white matter representing baseline chronic microvascular ischemic change.  The greater wing of the sphenoid on the LEFT is dehiscent. There is a gliotic nubbin of LEFT medial temporal lobe measuring 10 x 11 mm cross-section projecting into and expanded sphenoid sinus. Contained T2 hyperintense fluid as overall cross-sectional measurements considerably larger, measuring 33 x 25 mm cross-section. 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 Neurosurgical consultation is warranted.  Marked T2 hyperintensity accompanies the pons and midbrain likely  extensive chronic microvascular ischemic change or. The no restricted diffusion is associated. Tiny focus of susceptibility in the RIGHT paramedian pons as seen on image 7 series 8 with slight heterogeneity of signal, could also represent a subacute bleed. Multiple micro bleeds are seen throughout both cerebral and both cerebellar hemispheres of a chronic nature.  No sinus air-fluid level. Negative orbits. Trace BILATERAL mastoid effusions. Carotid, basilar, and vertebral flow voids are maintained.  MRA HEAD FINDINGS  RIGHT ICA:  Moderate dolichoectasia of the petrous segment. Minor irregularity of the cavernous segment. Supraclinoid ICA tandem stenoses estimated 50-75%. Normal appearing ICA terminus.  LEFT ICA: Minor irregularity of the petrous and cavernous segments without flow reducing lesion. No supraclinoid or ICA terminus abnormality.  Basilar artery:  Minor irregularity without flow-limiting stenosis.  Vertebral arteries: RIGHT vertebral dominant. Severe stenosis of the distal LEFT vertebral at the basilar junction, estimated 90%.  Anterior cerebral arteries: Moderate irregularity on the RIGHT. LEFT A1 ACA dominant. No distal anterior cerebral stenosis.  Middle cerebral arteries: No proximal M1 stenosis on the RIGHT or LEFT. Moderate irregularity at the M2 and M3 segments consistent with intracranial atherosclerotic disease.  Posterior cerebral arteries: Minor irregularity without flow reducing proximal lesion.  Cerebellar arteries: Moderately diseased LEFT AICA. LEFT PICA not visualized. Normal-appearing superior cerebellar arteries.  IMPRESSION: Suspect subacute LEFT external capsule intracerebral hematoma, 11 x 15 mm cross-section. 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 Contained CSF projects within the enlarged LEFT sphenoid sinus. Neurosurgical consultation is warranted.  Extensive chronic microvascular ischemic change throughout the cerebral hemispheres and brainstem.  Potentially flow reducing stenoses of the supraclinoid ICA, distal LEFT vertebral, and cerebellar vessels.   Electronically Signed   By: Rolla Flatten M.D.   On: 10/14/2014 19:58      Microbiology: Recent Results (from the past 240 hour(s))  MRSA PCR Screening     Status: None   Collection Time: 10/17/14 10:50 AM  Result Value Ref Range Status    MRSA by PCR NEGATIVE NEGATIVE Final    Comment:        The GeneXpert MRSA Assay (FDA approved for NASAL specimens only), is one component of a comprehensive MRSA colonization surveillance program. It is not intended to diagnose MRSA infection nor to guide or monitor treatment for MRSA infections.   Culture, Urine     Status: None   Collection Time: 10/19/14  1:16 AM  Result Value Ref Range Status   Specimen Description URINE, RANDOM  Final   Special Requests NONE  Final   Colony Count   Final    >=100,000 COLONIES/ML Performed at [facility]    Culture   Final    Multiple bacterial morphotypes present, none predominant. Suggest appropriate recollection if clinically indicated. Performed at [facility]    Report Status 10/20/2014 FINAL  Final     Labs: Results for orders placed or performed during the hospital encounter of 10/17/14 (from the past 48 hour(s))  Vitamin B12     Status: None   Collection Time: 10/20/14  1:25 PM  Result Value Ref Range   Vitamin B-12 549 211 - 911 pg/mL    Comment: Performed at [facility]  Ammonia     Status: None   Collection Time: 10/20/14  1:25 PM  Result Value Ref Range   Ammonia  20 11 - 32 umol/L  Glucose, capillary     Status: None   Collection Time: 10/20/14  4:50 PM  Result Value Ref Range   Glucose-Capillary 82 70 - 99 mg/dL  Glucose, capillary     Status: Abnormal   Collection Time: 10/20/14  8:42 PM  Result Value Ref Range   Glucose-Capillary 104 (H) 70 - 99 mg/dL  Glucose, capillary     Status: None   Collection Time: 10/21/14 12:25 AM  Result Value Ref Range   Glucose-Capillary 97 70 - 99 mg/dL   Comment 1 Notify RN    Comment 2 Document in Chart   Glucose, capillary     Status: None   Collection Time: 10/21/14  4:16 AM  Result Value Ref Range   Glucose-Capillary 89 70 - 99 mg/dL   Comment 1 Notify RN    Comment 2 Document in Chart   Glucose, capillary     Status: None   Collection  Time: 10/21/14  8:25 AM  Result Value Ref Range   Glucose-Capillary 97 70 - 99 mg/dL  Glucose, capillary     Status: Abnormal   Collection Time: 10/21/14 11:23 AM  Result Value Ref Range   Glucose-Capillary 194 (H) 70 - 99 mg/dL  Glucose, capillary     Status: Abnormal   Collection Time: 10/21/14  3:49 PM  Result Value Ref Range   Glucose-Capillary 114 (H) 70 - 99 mg/dL  Glucose, capillary     Status: Abnormal   Collection Time: 10/21/14  7:56 PM  Result Value Ref Range   Glucose-Capillary 100 (H) 70 - 99 mg/dL  Glucose, capillary     Status: None   Collection Time: 10/22/14 12:02 AM  Result Value Ref Range   Glucose-Capillary 93 70 - 99 mg/dL  Glucose, capillary     Status: None   Collection Time: 10/22/14  3:07 AM  Result Value Ref Range   Glucose-Capillary 89 70 - 99 mg/dL  Glucose, capillary     Status: None   Collection Time: 10/22/14  7:58 AM  Result Value Ref Range   Glucose-Capillary 89 70 - 99 mg/dL  CBC with Differential/Platelet     Status: Abnormal   Collection Time: 10/22/14  8:30 AM  Result Value Ref Range   WBC 3.5 (L) 4.0 - 10.5 K/uL   RBC 3.61 (L) 3.87 - 5.11 MIL/uL   Hemoglobin 8.3 (L) 12.0 - 15.0 g/dL   HCT 25.8 (L) 36.0 - 46.0 %   MCV 71.5 (L) 78.0 - 100.0 fL   MCH 23.0 (L) 26.0 - 34.0 pg   MCHC 32.2 30.0 - 36.0 g/dL   RDW 19.2 (H) 11.5 - 15.5 %   Platelets 163 150 - 400 K/uL    Comment: PLATELET COUNT CONFIRMED BY SMEAR   Neutrophils Relative % 23 (L) 43 - 77 %   Lymphocytes Relative 59 (H) 12 - 46 %   Monocytes Relative 13 (H) 3 - 12 %   Eosinophils Relative 4 0 - 5 %   Basophils Relative 1 0 - 1 %   Neutro Abs 0.8 (L) 1.7 - 7.7 K/uL   Lymphs Abs 2.1 0.7 - 4.0 K/uL   Monocytes Absolute 0.5 0.1 - 1.0 K/uL   Eosinophils Absolute 0.1 0.0 - 0.7 K/uL   Basophils Absolute 0.0 0.0 - 0.1 K/uL   WBC Morphology ATYPICAL LYMPHOCYTES   Renal function panel     Status: Abnormal   Collection Time: 10/22/14  8:30 AM  Result  Value Ref Range   Sodium 129 (L)  135 - 145 mmol/L   Potassium 3.9 3.5 - 5.1 mmol/L   Chloride 88 (L) 96 - 112 mmol/L   CO2 28 19 - 32 mmol/L   Glucose, Bld 96 70 - 99 mg/dL   BUN 28 (H) 6 - 23 mg/dL   Creatinine, Ser 7.46 (H) 0.50 - 1.10 mg/dL   Calcium 9.8 8.4 - 10.5 mg/dL   Phosphorus 4.4 2.3 - 4.6 mg/dL   Albumin 3.1 (L) 3.5 - 5.2 g/dL   GFR calc non Af Amer 5 (L) >90 mL/min   GFR calc Af Amer 6 (L) >90 mL/min    Comment: (NOTE) The eGFR has been calculated using the CKD EPI equation. This calculation has not been validated in all clinical situations. eGFR's persistently <90 mL/min signify possible Chronic Kidney Disease.    Anion gap 13 5 - 15    History of present illness 64 year old woman presented with "confusion", present when she woke up 3/17 in preparation for dialysis. Had difficulty putting her teeth and. Noted to have significantly high blood pressure was systolic in the 683M, treated with labetalol infusion. Per admitting physician's note systolic blood pressure greater than 180 was noted on previous hospitalization. Admitted for hypertensive emergency with acute encephalopathy. No confusion was noted by admitting physician, however nursing noted confusion overnight.  On 3/16 she was diagnosed with intracranial hemorrhage and hypertensive emergency. According to chart, neurosurgery recommended no intervention. Outpatient follow-up was recommended.  Assessment/Plan:  Accelerated HTN/HTN patient initially extremely hypertensive. Now blood pressure has been very labile, patient had a low blood pressure yesterday after resuming home medications. Discharge canceled on 3/21. All  blood pressure medications were held for systolic less than 196. Blood pressure actually improved today prior to dialysis. During dialysis blood pressure is low again with a systolic of 222. I feel that the patient should be monitored tonight to assure the family that the blood pressure will be stable. Will try to maintain systolic  blood pressure between 130 to 160. I have discontinued nifedipine from the patient's home medication list,. Neurology felt that the patient's effusion is secondary to hypertensive encephalopathy. Neurology recommends Goal SBP <160. No focal neuro deficits. By chart review it appears that her blood pressure responds to hemodialysis.    Acute encephalopathy. 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 Patient sleepy but not confused .Marland Kitchen Could recall her home address her birthday her husband's name and date of birth. Afebrile, no signs or symptoms to suggest infection. UA negative however urine culture grew greater than 100,000 colonies of multiple bacterial morphotypes.. Therefore will repeat UA today. Chest x-ray without acute abnormalities. Suspect encephalopathy related to recent hemorrhage VS underlying dementia. Vitamin B-12 ammonia level within normal limits. Family refusing SNF, will arrange for home health   3. End-stage renal disease. Appreciate nephrology input, Dr Deterding ,TTS at [place]    4. Anemia of chronic disease. Per nephrology   5. Intracerebral bleed 3/14. 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 Per Dr. Jerilee Hoh: "Neurosurgery, Dr. Arnoldo Morale, reviewed MRI at that time and opined no surgical intervention was warranted. MRI shows.Infarct left basal ganglia extending to the posterior limb of the internal capsule, stable per radiology. No antiplatelet therapy secondary to intracranial bleeding   6. Moderately large encephalocele and meningocele. Outpatient follow with neurosurgery planned.   7. Severe protein calorie malnutrition.  Discharge Exam:    Blood pressure 109/57, pulse 76, temperature 97.9 F (36.6 C), temperature source Oral, resp. rate 16, height $RemoveBe'5\' 8"'fUdwjgWzE$  (1.727 m), weight 61.8 kg (136 lb 3.9 oz), SpO2 99  %.    General: Appears calm but confused,. Nontoxic.  Eyes: PERRL, normal lids, irises   ENT: grossly normal hearing, lips & tongue  Cardiovascular: RRR, no m/r/g. No LE edema.  Telemetry: SR, no arrhythmias   Respiratory: CTA bilaterally, no w/r/r. Normal respiratory effort.  Abdomen: soft, ntnd  Skin: no rash or induration seen   Musculoskeletal: grossly normal tone BUE/BLE; exam limited only follows commands intermittently, able to lift both arms and legs.  Psychiatric: Confused. Oriented to location but not month or year. Speech is intelligible, fluent but not always appropriate.  Neurologic: Exam limited by confusion but cranial nerves appear intact. No definite pronator drift. No focal neuro deficits noted.    Discharge Instructions    Diet - low sodium heart healthy    Complete by:  As directed      Increase activity slowly    Complete by:  As directed            Follow-up Information    Follow up with [name], MD. Schedule an appointment as soon as possible for a visit in 1 week.   Specialty:  Internal Medicine   Contact information:   [address] [place] [address] [phone]       Signed: Reyne Dumas 10/22/2014, 12:21 PM

## 2014-10-22 NOTE — Progress Notes (Signed)
OT Cancellation Note  Patient Details Name: Sonya [name] MRN: [id number] DOB: 1951-07-04   Cancelled Treatment:     Pt at dialysis. OT to reattempt as schedule permits.  Hortencia Pilar 10/22/2014, 10:22 AM

## 2014-10-22 NOTE — Progress Notes (Signed)
PT Cancellation Note  Patient Details Name: Sonya [name] MRN: [id number] DOB: 09-11-1950   Cancelled Treatment:    Reason Eval/Treat Not Completed: Patient at procedure or test/unavailable. Attempted to see patient as new order for Imminent DC was placed, however patient is in HD at this time. Will follow up later this afternoon.    Jacqualyn Posey 10/22/2014, 9:29 AM

## 2014-10-22 NOTE — Telephone Encounter (Signed)
LMOM to call.

## 2014-10-22 NOTE — Procedures (Signed)
I have seen and examined this patient and agree with the plan of care . No complaints seen on dialysis no emerging issues Veera Stapleton W 10/22/2014, 10:23 AM

## 2014-10-22 NOTE — Progress Notes (Signed)
Physical Therapy Treatment Patient Details Name: Sonya [name] MRN: [id number] DOB: 30-Dec-1950 Today's Date: 10/22/2014    History of Present Illness Admitted with confusion  Hypertensive emergency and encephalopathy    PT Comments    Patient with increased confusion today per cousins report. Patient unaware that she had had dialysis earlier today and for me she was unable to state her husbands name. Patient forgetting throughout session that she was in the hospital. Patient adamantly refusing any ambulation with me however with assistance from her cousin and some coaxing, I was able to walk the patient to the recliner to sit up and eat lunch. Patient is limited at this time due to her confusion and cognitive deficits. Patient will require 24/7 assistance at home as she is an increased fall risk and attempts to get up unsafely on her own. IF FAMILY CANNOT PROVIDE 24/7 ASSIST THE PATIENT IS MOST APPROPRIATE FOR SNF PLACEMENT FOR ONGOING THERAPY NEEDS. Cousin is planning to speak with family this evening. OT, RN and CSW made aware. I will follow up in the morning to see how patient is progressing.   Follow Up Recommendations  Home health PT     Equipment Recommendations       Recommendations for Other Services       Precautions / Restrictions Precautions Precautions: Fall    Mobility  Bed Mobility Overal bed mobility: Needs Assistance Bed Mobility: Supine to Sit     Supine to sit: Min assist     General bed mobility comments: Min A for safety and control.   Transfers Overall transfer level: Needs assistance Equipment used: 1 person hand held assist Transfers: Sit to/from [facility] Sit to Stand: Min assist         General transfer comment: Patient requires Min A to stand due to decreased safety. Patient very impulsive and attempting to stand on her own when educated not to. Patient can be very quick.   Ambulation/Gait Ambulation/Gait assistance: +2  physical assistance;Min assist Ambulation Distance (Feet): 15 Feet Assistive device: 2 person hand held assist Gait Pattern/deviations: Step-through pattern;Decreased stride length Gait velocity: Quite slow   General Gait Details: Patient adamently refused ambulation initially until discussed with patient that she would have to ambulate prior to DC home. Patient required +2 assist (cousin assisted me) to initiate/coax any ambulation out of patient. Patient with B HHA and min A for safety and balance as patient with posterior lean and decreased step lenght bilaterally.    Stairs            Wheelchair Mobility    Modified Rankin (Stroke Patients Only)       Balance                                    Cognition Arousal/Alertness: Awake/alert Behavior During Therapy: Agitated Overall Cognitive Status: Impaired/Different from baseline Area of Impairment: Orientation;Attention;Following commands;Safety/judgement;Awareness;Problem solving Orientation Level: Disoriented to;Place;Time;Situation Current Attention Level: Focused Memory: Decreased short-term memory Following Commands: Follows one step commands inconsistently Safety/Judgement: Decreased awareness of deficits;Decreased awareness of safety   Problem Solving: Slow processing;Decreased initiation;Difficulty sequencing;Requires verbal cues;Requires tactile cues General Comments: Patient unable to state her husbands name this session. Patient stating that she did not have dialysis today when she had just return to room from dialysis.     Exercises      General Comments        Pertinent Vitals/Pain  Pain Assessment: No/denies pain    Home Living                      Prior Function            PT Goals (current goals can now be found in the care plan section) Progress towards PT goals: Not progressing toward goals - comment    Frequency  Min 3X/week    PT Plan Current plan remains  appropriate    Co-evaluation             End of Session Equipment Utilized During Treatment: Gait belt Activity Tolerance: Treatment limited secondary to agitation Patient left: in chair;with call bell/phone within reach;with family/visitor present     Time: 1403 (1315)-1430 (1327) PT Time Calculation (min) (ACUTE ONLY): 27 min  Charges:  $Gait Training: 8-22 mins $Therapeutic Activity: 23-37 mins                    G Codes:      Jacqualyn Posey 10/22/2014, 2:40 PM 10/22/2014 Jacqualyn Posey PTA [phone] pager [phone] office

## 2014-10-22 NOTE — Progress Notes (Signed)
NUTRITION FOLLOW UP  Intervention:   Provide Ensure Enlive po once daily, each supplement provides 350 kcal and 20 grams of protein  Resource Breeze po once daily, each supplement provides 250 kcal and 9 grams of protein  Nutrition Dx:   Inadequate oral intake related to severe nausea/vomiting as evidenced by loss of 38 lbs in 5 months; ongoing  Goal:   Pt to meet >/= 90% of their estimated nutrition needs; unmet  Monitor:   PO intake, weight, labs, I/O's  Assessment:   64 year old female with past medical history of HTN Hypothyroidism; Uterine cancer, Anemia; CKD,, Dysphagia Hyperparathyroidism due to renal insufficiency, GERD and Diverticulitis. Patient was found to have elevated blood pressure in the doctor's office and they recommended admission.  Pt with lunch tray in front of her at time of visit- only ate a few bites. Pt reports poor appetite and eating <25% of most meals. Per nursing notes, pt has been eating 50-75% of most meals. Pt's weight has decreased an additional 12 lbs in the past week. Pt tried nutritional supplements at time of visit.   RD encouraged pt to eat every 2-3 hours. Encouraged intake of nutritional supplements twice daily to improve nutrition status.   Labs: low hemoglobin, low sodium, low chloride, low GFR  Height: Ht Readings from Last 1 Encounters:  10/17/14 5\' 8"  (1.727 m)    Weight Status:   Wt Readings from Last 1 Encounters:  10/22/14 129 lb 10.1 oz (58.8 kg)    Re-estimated needs:  Kcal:1600-1800  Protein: 75- 90 grams Fluid:1200 mls  Skin: intact  Diet Order: Diet renal/carb modified with 1200 ml fluid restriction Diet - low sodium heart healthy   Intake/Output Summary (Last 24 hours) at 10/22/14 1634 Last data filed at 10/22/14 1227  Gross per 24 hour  Intake    240 ml  Output   3000 ml  Net  -2760 ml    Last BM: 3/19   Labs:   Recent Labs Lab 10/18/14 0829 10/18/14 0839 10/19/14 1900 10/22/14 0830  NA 137  --   133* 129*  K 4.4  --  4.1 3.9  CL 98  --  96 88*  CO2 29  --  28 28  BUN 12  --  19 28*  CREATININE 4.41*  --  7.37* 7.46*  CALCIUM 9.4  --  9.6 9.8  PHOS  --  3.3 5.1* 4.4  GLUCOSE 100*  --  82 96    CBG (last 3)   Recent Labs  10/22/14 0758 10/22/14 1318 10/22/14 1606  GLUCAP 89 90 117*    Scheduled Meds: . allopurinol  300 mg Oral Daily  . darbepoetin (ARANESP) injection - DIALYSIS  100 mcg Intravenous Q Tue-HD  . docusate sodium  100 mg Oral BID  . doxercalciferol  5 mcg Intravenous Q T,Th,Sa-HD  . [START ON 10/24/2014] ferric gluconate (FERRLECIT/NULECIT) IV  62.5 mg Intravenous Q Thu-HD  . hydrALAZINE  50 mg Oral 3 times per day  . insulin aspart  0-9 Units Subcutaneous 6 times per day  . labetalol  300 mg Oral TID  . levothyroxine  50 mcg Oral QAC breakfast  . Linaclotide  290 mcg Oral q morning - 10a  . multivitamin  1 tablet Oral QHS  . pantoprazole  40 mg Oral Daily  . sevelamer carbonate  2,400 mg Oral TID WC    Continuous Infusions:   Pryor Ochoa RD, LDN Inpatient Clinical Dietitian Pager: [phone] After Hours Pager: [phone]

## 2014-10-22 NOTE — Progress Notes (Signed)
Occupational Therapy Treatment Patient Details Name: Sonya [name] MRN: [id number] DOB: 07-02-1951 Today's Date: 10/22/2014    History of present illness Admitted with confusion  Hypertensive emergency and encephalopathy   OT comments  Pt apparently confused. Able to follow 1 step commands with increased time. Oriented to self only. Cousin concerned as "this is not her baseline". Due to patient's cognitive deficits and level of assistance, pt will need direct 24/7 assistance after D/C. If family can not provide 24/7 assistance, pt will need to D/C to SNF.   Follow Up Recommendations  Home health OT;Supervision/Assistance - 24 hour    Equipment Recommendations  Tub/shower bench    Recommendations for Other Services      Precautions / Restrictions Precautions Precautions: Fall       Mobility Bed Mobility Overal bed mobility: Needs Assistance Bed Mobility: Supine to Sit     Supine to sit: Min assist     General bed mobility comments: Pt up in chair  Transfers Overall transfer level: Needs assistance Equipment used: 1 person hand held assist Transfers: Sit to/from Stand;Stand Pivot Transfers Sit to Stand: Min guard Stand pivot transfers: Min assist       General transfer comment: Pt trying to sit without chair behind her. guidied pt back to chair    Balance             Standing balance-Leahy Scale: Poor                     ADL   Eating/Feeding: Set up       Upper Body Bathing: Minimal assitance   Lower Body Bathing: Moderate assistance   Upper Body Dressing : Moderate assistance;Sitting   Lower Body Dressing: Minimal assistance   Toilet Transfer: Minimal assistance           Functional mobility during ADLs: Minimal assistance;Rolling walker (ambulating with min A for safety. )    Comments - able to complete basic ADL task with moc vc to attend and complete tasks                                      Cognition    Behavior During Therapy: Flat affect Overall Cognitive Status: Impaired/Different from baseline Area of Impairment: Orientation;Attention;Memory;Following commands;Safety/judgement;Awareness;Problem solving Orientation Level: Disoriented to;Place;Time;Situation Sonya [name]; Sonya [name]; 1918) Current Attention Level: Sustained Memory: Decreased recall of precautions;Decreased short-term memory  Following Commands: Follows one step commands with increased time Safety/Judgement: Decreased awareness of safety;Decreased awareness of deficits (trying to sit without chair behind her) Awareness: Intellectual Problem Solving: Slow processing;Decreased initiation;Difficulty sequencing;Requires verbal cues;Requires tactile cues General Comments: Pt unaware of PT just leaving room and having session with her Pt perseverating on going home and asking where husband/daughter are. Discussed this with pt at least 10 times.                             General Comments  Very slow, intentional movements    Pertinent Vitals/ Pain       Pain Assessment: No/denies pain  Home Living                                          Prior Functioning/Environment  Frequency Min 2X/week     Progress Toward Goals  OT Goals(current goals can now be found in the care plan section)  Progress towards OT goals: Progressing toward goals  Acute Rehab OT Goals Patient Stated Goal: spouse would like pt to retun home when she is medically stable q OT Goal Formulation: With family Time For Goal Achievement: 10/28/14 Potential to Achieve Goals: Good ADL Goals Pt Will Perform Grooming: with min guard assist;standing Pt Will Perform Upper Body Bathing: with supervision;sitting Pt Will Perform Lower Body Bathing: with min guard assist;sit to/from stand Pt Will Perform Upper Body Dressing: with supervision;sitting Pt Will Perform Lower Body Dressing: with min guard assist;sit to/from  stand Pt Will Transfer to Toilet: with min guard assist;ambulating;regular height toilet;bedside commode;grab bars Pt Will Perform Toileting - Clothing Manipulation and hygiene: with min guard assist;sit to/from stand Pt Will Perform Tub/Shower Transfer: Tub transfer;with min assist;ambulating;tub bench;rolling walker  Plan Discharge plan remains appropriate    Co-evaluation                 End of Session Equipment Utilized During Treatment: Gait belt;Rolling walker   Activity Tolerance Patient tolerated treatment well   Patient Left in chair;with call bell/phone within reach;with chair alarm set;with family/visitor present   Nurse Communication Mobility status        Time: 1445-1510 OT Time Calculation (min): 25 min  Charges: OT General Charges $OT Visit: 1 Procedure OT Treatments $Self Care/Home Management : 23-37 mins  Sonya [name],Sonya [name] 10/22/2014, 3:16 PM   [facility], OTR/L  [phone] 10/22/2014

## 2014-10-23 DIAGNOSIS — I10 Essential (primary) hypertension: Secondary | ICD-10-CM | POA: Diagnosis not present

## 2014-10-23 DIAGNOSIS — Z992 Dependence on renal dialysis: Secondary | ICD-10-CM | POA: Diagnosis not present

## 2014-10-23 DIAGNOSIS — Z23 Encounter for immunization: Secondary | ICD-10-CM | POA: Diagnosis not present

## 2014-10-23 DIAGNOSIS — E039 Hypothyroidism, unspecified: Secondary | ICD-10-CM | POA: Diagnosis not present

## 2014-10-23 DIAGNOSIS — N2581 Secondary hyperparathyroidism of renal origin: Secondary | ICD-10-CM | POA: Diagnosis not present

## 2014-10-23 DIAGNOSIS — R4182 Altered mental status, unspecified: Secondary | ICD-10-CM | POA: Diagnosis not present

## 2014-10-23 DIAGNOSIS — I614 Nontraumatic intracerebral hemorrhage in cerebellum: Secondary | ICD-10-CM

## 2014-10-23 DIAGNOSIS — N186 End stage renal disease: Secondary | ICD-10-CM | POA: Diagnosis not present

## 2014-10-23 DIAGNOSIS — Z5189 Encounter for other specified aftercare: Secondary | ICD-10-CM | POA: Diagnosis not present

## 2014-10-23 DIAGNOSIS — M1040 Other secondary gout, unspecified site: Secondary | ICD-10-CM | POA: Diagnosis not present

## 2014-10-23 DIAGNOSIS — I120 Hypertensive chronic kidney disease with stage 5 chronic kidney disease or end stage renal disease: Secondary | ICD-10-CM | POA: Diagnosis not present

## 2014-10-23 DIAGNOSIS — K219 Gastro-esophageal reflux disease without esophagitis: Secondary | ICD-10-CM | POA: Diagnosis not present

## 2014-10-23 DIAGNOSIS — G9340 Encephalopathy, unspecified: Secondary | ICD-10-CM | POA: Insufficient documentation

## 2014-10-23 DIAGNOSIS — D649 Anemia, unspecified: Secondary | ICD-10-CM | POA: Diagnosis not present

## 2014-10-23 DIAGNOSIS — D509 Iron deficiency anemia, unspecified: Secondary | ICD-10-CM | POA: Diagnosis not present

## 2014-10-23 DIAGNOSIS — Q019 Encephalocele, unspecified: Secondary | ICD-10-CM | POA: Diagnosis not present

## 2014-10-23 DIAGNOSIS — M6281 Muscle weakness (generalized): Secondary | ICD-10-CM | POA: Diagnosis not present

## 2014-10-23 DIAGNOSIS — E43 Unspecified severe protein-calorie malnutrition: Secondary | ICD-10-CM | POA: Diagnosis not present

## 2014-10-23 DIAGNOSIS — D631 Anemia in chronic kidney disease: Secondary | ICD-10-CM | POA: Diagnosis not present

## 2014-10-23 DIAGNOSIS — I619 Nontraumatic intracerebral hemorrhage, unspecified: Secondary | ICD-10-CM | POA: Diagnosis not present

## 2014-10-23 DIAGNOSIS — R410 Disorientation, unspecified: Secondary | ICD-10-CM | POA: Diagnosis not present

## 2014-10-23 DIAGNOSIS — R2689 Other abnormalities of gait and mobility: Secondary | ICD-10-CM | POA: Diagnosis not present

## 2014-10-23 LAB — GLUCOSE, CAPILLARY
GLUCOSE-CAPILLARY: 91 mg/dL (ref 70–99)
Glucose-Capillary: 79 mg/dL (ref 70–99)
Glucose-Capillary: 89 mg/dL (ref 70–99)
Glucose-Capillary: 104 mg/dL — ABNORMAL HIGH (ref 70–99)

## 2014-10-23 LAB — HEPATITIS B SURFACE ANTIGEN: HEP B S AG: NEGATIVE

## 2014-10-23 LAB — PATHOLOGIST SMEAR REVIEW

## 2014-10-23 MED ORDER — BOOST / RESOURCE BREEZE PO LIQD: 1.0000 | ORAL | Status: DC

## 2014-10-23 MED ORDER — ENSURE ENLIVE PO LIQD: 237.0000 mL | ORAL | Status: DC

## 2014-10-23 NOTE — Progress Notes (Signed)
Pt was discharged at this time to [facility] in [place]. Transported via [facility].

## 2014-10-23 NOTE — Progress Notes (Signed)
[facility] ROUNDING NOTE   Subjective:   Interval History: awaiting nursing home placement   Vital signs in last 24 hours:  Temp:  [98 F (36.7 C)-98.5 F (36.9 C)] 98.3 F (36.8 C) (03/23 0930) Pulse Rate:  [67-77] 67 (03/23 0930) Resp:  [15-20] 16 (03/23 0930) BP: (109-157)/(55-78) 144/65 mmHg (03/23 0930) SpO2:  [98 %-100 %] 100 % (03/23 0930) Weight:  [58.8 kg (129 lb 10.1 oz)] 58.8 kg (129 lb 10.1 oz) (03/22 1227)  Weight change:  Filed Weights   10/19/14 2240 10/22/14 0845 10/22/14 1227  Weight: 60.1 kg (132 lb 7.9 oz) 61.8 kg (136 lb 3.9 oz) 58.8 kg (129 lb 10.1 oz)    Intake/Output: I/O last 3 completed shifts: In: 22 [P.O.:660] Out: 3000 [Other:3000]   Intake/Output this shift:     CVS- RRR RS- CTA ABD- BS present soft non-distended EXT- no edema   Basic Metabolic Panel:  Recent Labs Lab 10/17/14 0707 10/18/14 0829 10/18/14 0839 10/19/14 1900 10/22/14 0830  NA 136 137  --  133* 129*  K 3.9 4.4  --  4.1 3.9  CL 99 98  --  96 88*  CO2 29 29  --  28 28  GLUCOSE 100* 100*  --  82 96  BUN 18 12  --  19 28*  CREATININE 6.96* 4.41*  --  7.37* 7.46*  CALCIUM 9.3 9.4  --  9.6 9.8  PHOS  --   --  3.3 5.1* 4.4    Liver Function Tests:  Recent Labs Lab 10/19/14 1900 10/22/14 0830  AST 13  --   ALT 10  --   ALKPHOS 31*  --   BILITOT 0.6  --   PROT 6.2  --   ALBUMIN 3.2* 3.1*   No results for input(s): LIPASE, AMYLASE in the last 168 hours.  Recent Labs Lab 10/20/14 1325  AMMONIA 20    CBC:  Recent Labs Lab 10/17/14 0707 10/18/14 0829 10/19/14 1900 10/22/14 0830  WBC 3.1* 3.5* 3.6* 3.5*  NEUTROABS 1.4*  --   --  0.8*  HGB 8.6* 9.3* 8.3* 8.3*  HCT 28.4* 30.9* 27.0* 25.8*  MCV 75.1* 73.9* 73.8* 71.5*  PLT 175 177 178 163    Cardiac Enzymes: No results for input(s): CKTOTAL, CKMB, CKMBINDEX, TROPONINI in the last 168 hours.  BNP: Invalid input(s): POCBNP  CBG:  Recent Labs Lab 10/22/14 2045  10/22/14 2352 10/23/14 0420 10/23/14 0551 10/23/14 0752  GLUCAP 98 109* 91 79 29    Microbiology: Results for orders placed or performed during the hospital encounter of 10/17/14  MRSA PCR Screening     Status: None   Collection Time: 10/17/14 10:50 AM  Result Value Ref Range Status   MRSA by PCR NEGATIVE NEGATIVE Final    Comment:        The GeneXpert MRSA Assay (FDA approved for NASAL specimens only), is one component of a comprehensive MRSA colonization surveillance program. It is not intended to diagnose MRSA infection nor to guide or monitor treatment for MRSA infections.   Culture, Urine     Status: None   Collection Time: 10/19/14  1:16 AM  Result Value Ref Range Status   Specimen Description URINE, RANDOM  Final   Special Requests NONE  Final   Colony Count   Final    >=100,000 COLONIES/ML Performed at [facility]    Culture   Final    Multiple bacterial morphotypes present, none predominant. Suggest appropriate recollection if  clinically indicated. Performed at [facility]    Report Status 10/20/2014 FINAL  Final    Coagulation Studies: No results for input(s): LABPROT, INR in the last 72 hours.  Urinalysis: No results for input(s): COLORURINE, LABSPEC, PHURINE, GLUCOSEU, HGBUR, BILIRUBINUR, KETONESUR, PROTEINUR, UROBILINOGEN, NITRITE, LEUKOCYTESUR in the last 72 hours.  Invalid input(s): APPERANCEUR    Imaging: Dg Chest Port 1 View  10/22/2014   CLINICAL DATA:  Confusion  EXAM: PORTABLE CHEST - 1 VIEW  COMPARISON:  10/27/2014  FINDINGS: Cardiac shadow is stable. The lungs are free of acute infiltrate or sizable effusion. No acute bony abnormality is noted.  IMPRESSION: No acute abnormality noted.   Electronically Signed   By: Inez Catalina M.D.   On: 10/22/2014 13:53     Medications:     . allopurinol  300 mg Oral Daily  . darbepoetin (ARANESP) injection - DIALYSIS  100 mcg Intravenous Q Tue-HD  . docusate sodium  100 mg Oral BID   . doxercalciferol  5 mcg Intravenous Q T,Th,Sa-HD  . feeding supplement (ENSURE ENLIVE)  237 mL Oral Q24H  . feeding supplement (RESOURCE BREEZE)  1 Container Oral Q24H  . [START ON 10/24/2014] ferric gluconate (FERRLECIT/NULECIT) IV  62.5 mg Intravenous Q Thu-HD  . hydrALAZINE  50 mg Oral 3 times per day  . insulin aspart  0-9 Units Subcutaneous 6 times per day  . labetalol  300 mg Oral TID  . levothyroxine  50 mcg Oral QAC breakfast  . Linaclotide  290 mcg Oral q morning - 10a  . multivitamin  1 tablet Oral QHS  . pantoprazole  40 mg Oral Daily  . sevelamer carbonate  2,400 mg Oral TID WC   acetaminophen **OR** acetaminophen, labetalol, ondansetron **OR** ondansetron (ZOFRAN) IV, sevelamer carbonate  Assessment/ Plan:     Appreciate assistance from [facility] hospitalist. TTS dialysis patient that was admitted with confusion and hypertension following intracranial hemorrhage   ESRD TTS  Anemia stable  HTN continue control of BP and ultrafiltration   LOS: 6 Sonya [name] W @TODAY @10 :44 AM

## 2014-10-23 NOTE — Clinical Social Work Psychosocial (Signed)
Clinical Social Work Department BRIEF PSYCHOSOCIAL ASSESSMENT 10/23/2014  Patient:  Sonya [name], Sonya [name]     Account Number:  [id number]     Admit date:  10/17/2014  Clinical Social Worker:  Marciano Sequin  Date/Time:  10/23/2014 08:28 AM  Referred by:  RN  Date Referred:  10/23/2014 Referred for  SNF Placement   Other Referral:   Interview type:  Family Other interview type:   pt is orient to self only; Alysia Penna, cousin [phone]    PSYCHOSOCIAL DATA Living Status:  HUSBAND Admitted from facility:   Level of care:   Primary support name:  Rekowski,Reuben Primary support relationship to patient:  SPOUSE Degree of support available:   Strong Support    CURRENT CONCERNS Current Concerns  None Noted   Other Concerns:    SOCIAL WORK ASSESSMENT / PLAN CSW met the pt's cousin Inez Catalina at the bedside. CSW introduced self and purpose of the visit. Inez Catalina attempted to call the pt's daughter Siri Cole, but no answer. Inez Catalina reported the pt's husband abd daughter is working. Inez Catalina reported the family made their decision last night to transition the pt to a SNF. CSW discussed clinical recommendation for SNF rehab. CSW inquired about the geographical location in which the family would like the pt receive rehab from. Inez Catalina reported [place]. CSW explained the SNF rehab process to the [place]. CSW answered all questions in which the [place] inquired about. CSW provided [place] with contact information for further questions. CSW left a voice message for the pt's husband. CSW will continue to follow this pt and assist with discharge as needed.   Assessment/plan status:  Psychosocial Support/Ongoing Assessment of Needs Other assessment/ plan:   Theressa Stamps in [place], [place], [place] [address]  [phone]  chair time 630am  arrive time 615am  T, TH, Sat   Information/referral to community resources:    PATIENT'S/FAMILY'S RESPONSE TO CURRENT DIAGNOSE: Inez Catalina did  not wish to discuss the pt current medical state.   PATIENT'S/FAMILY'S RESPONSE TO PLAN OF CARE: The family is in agreement to transiition the pt to SNF.   [place], MSW, [place]

## 2014-10-23 NOTE — Discharge Summary (Signed)
Physician Discharge Summary  Sonya [name] [id number] DOB: 07/14/1951 DOA: 10/17/2014  PCP: [name], MD  Admit date: 10/17/2014 Discharge date: 10/23/2014  Time spent: 65 minutes  Recommendations for Outpatient Follow-up:  1. Follow-up with [name], MD in 1 to 2 weeks. Patient's blood pressure needs to be reassessed and maintained systolic between 123456. 2. Follow-up height hemodialysis on dialysis days Tuesday Thursdays and Saturdays. Monitor patient's blood pressure during hemodialysis. Patient will need a CBC and a comprehensive metabolic profile and 123456 done during hemodialysis. 3. Follow-up with neurosurgery as outpatient as scheduled.  Discharge Diagnoses:  Principal Problem:   Hypertensive emergency Active Problems:   HTN (hypertension)   End stage renal disease   Intracerebral bleed   Protein-calorie malnutrition, severe   Confusion   Discharge Condition: Stable  Diet recommendation: Heart healthy  Filed Weights   10/19/14 2240 10/22/14 0845 10/22/14 1227  Weight: 60.1 kg (132 lb 7.9 oz) 61.8 kg (136 lb 3.9 oz) 58.8 kg (129 lb 10.1 oz)    History of present illness: Per Dr. Rande Lawman  Patient is a pleasant 64 year old woman just discharged from the hospital 1 day prior to admission, after workup for dizziness revealed an intracerebral bleed. She presented on day of admission with confusion. Patient and daughter both stated that she woke up early on the morning of admission, to prepare to go to dialysis but had difficulty putting in her teeth and " her mind was off". She proceeded to come to the hospital. Blood pressure was found to be elevated with systolics in the 123456. Of note, blood pressure had been elevated to the 180s to 190s during prior hospitalization as well and improved after hemodialysis. She was admitted to the ICU on a labetalol drip. By the time the admitting physician saw her,she did not appear confused, her blood pressure  was 150/75. [facility] was asked to admit her for further evaluation and management.  Hospital Course: Per Dr. Allyson Sabal   Accelerated HTN/HTN patient initially extremely hypertensive. Now blood pressure had been very labile, patient had a low blood pressure after resuming home medications. Discharge was canceled on 3/21. All blood pressure medications were held for systolic less than [id number]. Blood pressure actually improved prior to dialysis. During dialysis blood pressure was low again with a systolic of 0000000.  Patient was monitored overnight and systolic blood pressure  remained between 130 to 160. Dr Allyson Sabal discontinued nifedipine from the patient's home medication list,. Neurology felt that the patient's confusion was secondary to hypertensive encephalopathy. Neurology recommended Goal SBP <160. No focal neuro deficits. By chart review it appearred that her blood pressure responds to hemodialysis.    Acute encephalopathy. 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 Patient sleepy but not confused .Marland Kitchen Could recall her home address her birthday her husband's name and date of birth. Afebrile, no signs or symptoms to suggest infection. UA negative however urine culture grew greater than 100,000 colonies of multiple bacterial morphotypes.. Chest x-ray without acute abnormalities. Suspect encephalopathy related to recent hemorrhage VS underlying dementia. Vitamin B-12 ammonia level within normal limits. Family  initially refusing SNF,  however changed her mind and patient be discharged to a skilled nursing facility.    3. End-stage renal disease.  Patient was seen in consultation by nephrology and patient underwent hemodialysis during the hospitalization. Appreciate nephrology input, Dr Deterding ,TTS at [facility]    4. Anemia of chronic disease. Per  nephrology   5. Intracerebral  bleed 3/14. 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 Per Dr. Jerilee Hoh: "Neurosurgery, Dr. Arnoldo Morale, reviewed MRI at that time and opined no surgical intervention was warranted. 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 No antiplatelet therapy secondary to intracranial bleeding   6. Moderately large encephalocele and meningocele. Outpatient follow with neurosurgery planned.   7. Severe protein calorie malnutrition.      Procedures:  CT Head 10/16/12  CXR 10/22/14  MRI 10/19/14  EEG 10/19/2014  Consultations:  Nephrology : Dr Jimmy Footman 10/19/14  Neurology : Dr Doy Mince 10/19/14  Discharge Exam: Filed Vitals:   10/23/14 0930  BP: 144/65  Pulse: 67  Temp: 98.3 F (36.8 C)  Resp: 16    General: NAD Cardiovascular: RRR Respiratory: CTAB  Discharge Instructions   Discharge Instructions    Diet - low sodium heart healthy    Complete by:  As directed      Increase activity slowly    Complete by:  As directed           Current Discharge Medication List    START taking these medications   Details  !! feeding supplement, ENSURE ENLIVE, (ENSURE ENLIVE) LIQD Take 237 mLs by mouth daily. Qty: 237 mL, Refills: 12    !! feeding supplement, RESOURCE BREEZE, (RESOURCE BREEZE) LIQD Take 1 Container by mouth daily. Refills: 0    !! Nutritional Supplements (FEEDING SUPPLEMENT, NEPRO CARB STEADY,) LIQD Take 237 mLs by mouth as needed (missed meal during dialysis.). Qty: 237 mL, Refills: 0     !! - Potential duplicate medications found. Please discuss with provider.    CONTINUE these medications which have CHANGED   Details  hydrALAZINE (APRESOLINE) 50 MG tablet Take 1 tablet (50 mg total) by mouth every 8 (eight) hours. Qty: 90 tablet, Refills: 0      CONTINUE these medications which have NOT CHANGED   Details  allopurinol (ZYLOPRIM) 300 MG tablet Take 300 mg by mouth daily.     docusate sodium (COLACE) 100 MG capsule Take 100 mg by mouth 2 (two) times daily.    ferrous sulfate dried (SLOW FE) 160 (50 FE) MG TBCR SR tablet Take 640 mg by mouth daily.    labetalol (NORMODYNE) 300 MG tablet Take 1 tablet (300 mg total) by mouth 3 (three) times daily. Qty: 90 tablet, Refills: 1    levothyroxine (SYNTHROID, LEVOTHROID) 50 MCG tablet Take 1 tablet (50 mcg total) by mouth daily. Qty: 30 tablet, Refills: 2    lidocaine-prilocaine (EMLA) cream Apply 1 application topically as needed (on Dialysis days Tues,Thurs.,Sat.).    Linaclotide (LINZESS) 290 MCG CAPS capsule 1 PO 30 MINS PRIOR TO BREAKFAST. IF O SATISFACTORY BM AFTER 2 WEEKS, TAKE WITH BREAKFAST. Qty: 30 capsule, Refills: 11    omeprazole (PRILOSEC) 20 MG capsule Take 1 capsule (20 mg total) by mouth daily. Qty: 31 capsule, Refills: 5    sevelamer carbonate (RENVELA) 800 MG tablet Take 800-2,400 mg by mouth 3 (three) times daily with meals. Patient takes 1 tablet with snack and 3 tablets with meal    sodium bicarbonate 650 MG tablet Take 1 tablet (650 mg total) by mouth 2 (two) times daily. Qty: 60 tablet, Refills: 1      STOP taking these medications     NIFEdipine (PROCARDIA) 20 MG capsule      oxyCODONE-acetaminophen (PERCOCET/ROXICET) 5-325 MG per tablet  Allergies  Allergen Reactions  . Cephalexin Itching   Follow-up Information    Follow up with [name], MD. Schedule an appointment as soon as possible for a visit in 1 week.   Specialty:  Internal Medicine   Contact information:   [address] [place] [id number] [phone]       Follow up On 10/24/2014.   Why:  Follow-up in hemodialysis as scheduled on Tuesday Thursdays and Saturdays.      Please follow up.   Why:  Follow-up with neurosurgery as scheduled       The results of significant diagnostics from this hospitalization (including imaging, microbiology, ancillary and laboratory) are listed below for  reference.    Significant Diagnostic Studies: Dg Chest 2 View  10/17/2014   CLINICAL DATA:  Renal failure and confusion  EXAM: CHEST  2 VIEW  COMPARISON:  10/14/2014  FINDINGS: Mild cardiomegaly and aortic tortuosity which stable from prior. Trace pleural effusions. There is no edema, consolidation, or pneumothorax.  IMPRESSION: Trace pleural effusions.   Electronically Signed   By: Monte Fantasia M.D.   On: 10/17/2014 07:42   Dg Chest 2 View  10/14/2014   CLINICAL DATA:  Hypertension and dizziness  EXAM: CHEST  2 VIEW  COMPARISON:  09/17/2013  FINDINGS: There is chronic moderate cardiomegaly and aortic tortuosity.  Trace left pleural effusion which is new. No edema or suspected pneumonia. No pneumothorax. No acute osseous findings.  IMPRESSION: 1. Trace left pleural effusion. 2. Stable cardiomegaly.   Electronically Signed   By: Monte Fantasia M.D.   On: 10/14/2014 16:24   Ct Head Wo Contrast  10/17/2014   CLINICAL DATA:  Dizziness and confusion. Recent left-sided external capsule and lentiform nucleus infarction and hemorrhage.  EXAM: CT HEAD WITHOUT CONTRAST  TECHNIQUE: Contiguous axial images were obtained from the base of the skull through the vertex without intravenous contrast.  COMPARISON:  10/15/2014  FINDINGS: Involving and stable appearance to infarction involving the posterior left basal ganglia and extending to involve the posterior limb of the internal capsule and the adjacent external capsule. No new hemorrhage or mass effect identified. 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 No cortical infarction, hydrocephalus or extra-axial fluid collection identified. 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  IMPRESSION: No change in appearance of head CT since the most recent study. 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 No new hemorrhage  or infarction identified. Stable encephalocele into left sphenoid sinus.   Electronically Signed   By: Aletta Edouard M.D.   On: 10/17/2014 07:39   Ct Head Wo Contrast  10/15/2014   CLINICAL DATA:  Abnormal MRI with dizziness and weakness for 1 month. Intracranial bleed.  EXAM: CT HEAD WITHOUT CONTRAST  TECHNIQUE: Contiguous axial images were obtained from the base of the skull through the vertex without intravenous contrast.  COMPARISON:  10/14/2014  FINDINGS: Skull and Sinuses:No acute findings.  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 Herniated temporal lobe is better seen on recent MRI imaging.  Orbits: No acute abnormality.  Brain: An isodense to hypodense hemorrhage in or below the posterior left putamen shows no visible enlargement. There is extensive chronic small vessel disease with confluent ischemic gliosis throughout the bilateral cerebral white matter and remote bilateral thalamic infarcts. Marked disease of the pons is  better seen on previous MRI. No evidence of acute infarct, hydrocephalus, or shift.  IMPRESSION: 1. Stable appearance of subacute and isodense left basal ganglia hematoma. No acute hemorrhage. 2. Advanced chronic small vessel disease. 3. 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   Electronically Signed   By: Monte Fantasia M.D.   On: 10/15/2014 12:46   Ct Head Wo Contrast  10/14/2014   CLINICAL DATA:  Frequent falls for 1 month, elevated blood pressure and dizziness today, history hypertension, chronic kidney disease  EXAM: CT HEAD WITHOUT CONTRAST  TECHNIQUE: Contiguous axial images were obtained from the base of the skull through the vertex without intravenous contrast.  COMPARISON:  12/02/2008  FINDINGS: Normal ventricular morphology.  No midline shift or mass effect.  Extensive small vessel chronic ischemic  changes of deep cerebral white matter.  No intracranial hemorrhage or evidence acute infarction.  No extra-axial fluid collection.  Mastoid air cells and middle ear cavities appear clear.  Fluid attenuation mass at identified at LEFT sphenoid sinus increased in size since previous exam now 2.4 x 3.4 cm.  This contains a 10 mm diameter soft tissue component laterally.  This appears to be associated with a defect in the medial wall of the LEFT middle cranial fossa.  Potentially this could represent a small encephalocele with meninges and CSF protruding into the sphenoid sinus rather than a mucosal retention cyst.  Atherosclerotic calcifications of the carotid siphons.  Calvaria intact.  IMPRESSION: Extensive small vessel chronic ischemic changes of deep cerebral white matter.  Progressive LEFT sphenoid sinus opacification containing fluid components as well as a soft tissue nodular component laterally ; potentially this may represent herniation of meninges and a portion of the anterior LEFT temporal lobe/encephalocele through a defect in the wall the middle cranial fossa into the sphenoid sinus rather than a complicated mucosal retention cyst/mucocele of the sphenoid sinus.  MR imaging with and without contrast recommended for further assessment.  Findings called to Dr. Stark Jock on 10/14/2014 at 0530 hours.   Electronically Signed   By: Lavonia Dana M.D.   On: 10/14/2014 17:31   Mr [name] Nam Head Wo Contrast  10/14/2014   CLINICAL DATA:  Weakness and dizziness for 1 month. History of renal failure. Initial encounter.  EXAM: MRI HEAD WITHOUT CONTRAST  MRA HEAD WITHOUT CONTRAST  TECHNIQUE: Multiplanar, multiecho pulse sequences of the brain and surrounding structures were obtained without intravenous contrast. Angiographic images of the head were obtained using MRA technique without contrast.  COMPARISON:  CT head earlier today.  FINDINGS: MRI HEAD FINDINGS  There is a moderate sized area of restricted diffusion in the external  capsule on the LEFT, slightly involving the lentiform nucleus and adjacent to the posterior limb internal capsule. Cross-sectional measurements are 11 x 15 mm. This lesion is hyperintense on T1, T2, and FLAIR imaging, with isointense central component on gradient sequence and peripheral T2 shortening. I believe this represents a subacute intracerebral hematoma which has become isodense on CT scan as the lesion is virtually inapparent. There is moderate surrounding vasogenic edema which accompanies extensive T2 and FLAIR hyperintensity throughout the remainder the white matter representing baseline chronic microvascular ischemic change.  The greater wing of the sphenoid on the LEFT is dehiscent. There is a gliotic nubbin of LEFT medial temporal lobe measuring 10 x 11 mm cross-section projecting into and expanded sphenoid sinus. Contained T2 hyperintense fluid as overall cross-sectional measurements considerably larger, measuring 33 x 25 mm cross-section. Findings  are consistent with medially projecting meningocele and encephalocele. Neurosurgical consultation is warranted.  Marked T2 hyperintensity accompanies the pons and midbrain likely extensive chronic microvascular ischemic change or. The no restricted diffusion is associated. Tiny focus of susceptibility in the RIGHT paramedian pons as seen on image 7 series 8 with slight heterogeneity of signal, could also represent a subacute bleed. Multiple micro bleeds are seen throughout both cerebral and both cerebellar hemispheres of a chronic nature.  No sinus air-fluid level. Negative orbits. Trace BILATERAL mastoid effusions. Carotid, basilar, and vertebral flow voids are maintained.  MRA HEAD FINDINGS  RIGHT ICA: Moderate dolichoectasia of the petrous segment. Minor irregularity of the cavernous segment. Supraclinoid ICA tandem stenoses estimated 50-75%. Normal appearing ICA terminus.  LEFT ICA: Minor irregularity of the petrous and cavernous segments without flow  reducing lesion. No supraclinoid or ICA terminus abnormality.  Basilar artery:  Minor irregularity without flow-limiting stenosis.  Vertebral arteries: RIGHT vertebral dominant. Severe stenosis of the distal LEFT vertebral at the basilar junction, estimated 90%.  Anterior cerebral arteries: Moderate irregularity on the RIGHT. LEFT A1 ACA dominant. No distal anterior cerebral stenosis.  Middle cerebral arteries: No proximal M1 stenosis on the RIGHT or LEFT. Moderate irregularity at the M2 and M3 segments consistent with intracranial atherosclerotic disease.  Posterior cerebral arteries: Minor irregularity without flow reducing proximal lesion.  Cerebellar arteries: Moderately diseased LEFT AICA. LEFT PICA not visualized. Normal-appearing superior cerebellar arteries.  IMPRESSION: Suspect subacute LEFT external capsule intracerebral hematoma, 11 x 15 mm cross-section. 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 Contained CSF projects within the enlarged LEFT sphenoid sinus. Neurosurgical consultation is warranted.  Extensive chronic microvascular ischemic change throughout the cerebral hemispheres and brainstem.  Potentially flow reducing stenoses of the supraclinoid ICA, distal LEFT vertebral, and cerebellar vessels.   Electronically Signed   By: Rolla Flatten M.D.   On: 10/14/2014 19:58   Mr Brain Wo Contrast  10/19/2014   CLINICAL DATA:  Dizziness and confusion. Recent brain hemorrhage, now resolved. Subsequent encounter.  EXAM: MRI HEAD WITHOUT CONTRAST  TECHNIQUE: Multiplanar, multiecho pulse sequences of the brain and surrounding structures were obtained without intravenous contrast.  COMPARISON:  MR brain 10/14/2014.  CT head 10/17/2014.  FINDINGS: Redemonstrated is what is felt to be a slightly  greater than 1 cm subacute intracerebral hematoma involving the LEFT lentiform nucleus and external capsule. Stable surrounding T1 and T2 hypointense rim.  Generalized atrophy with chronic microvascular ischemic change. No features suggestive of PRES.  Widely scattered microhemorrhages throughout the brain, likely sequelae of hypertensive cerebrovascular disease. These are also stable.  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 Small gliotic nubbin of temporal lobe 10 x 11 mm unchanged. 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  Stable flow voids in extracranial soft tissues.  IMPRESSION: Chronic changes as described with subacute LEFT basal ganglia hematoma, atrophy with chronic microvascular ischemic change, and scattered microhemorrhages. No features suggestive of acute hypertensive encephalopathy or PRES.  Stable LEFT greater sphenoid wing medially projecting encephalocele and meningocele.   Electronically Signed   By: Rolla Flatten M.D.   On: 10/19/2014 11:18   Mr Brain Wo Contrast  10/14/2014   CLINICAL DATA:  Weakness and dizziness for 1 month. History of renal failure. Initial encounter.  EXAM: MRI HEAD WITHOUT CONTRAST  MRA HEAD WITHOUT CONTRAST  TECHNIQUE: Multiplanar, multiecho pulse sequences of the brain and surrounding structures were obtained without intravenous contrast. Angiographic images of the head were obtained using MRA technique without contrast.  COMPARISON:  CT head earlier today.  FINDINGS: MRI HEAD FINDINGS  There is a moderate sized area of restricted diffusion in the external capsule on the LEFT, slightly involving the lentiform nucleus and adjacent to the posterior limb internal capsule. Cross-sectional measurements are 11 x 15 mm. This lesion is hyperintense on T1, T2, and FLAIR imaging, with isointense central component on gradient sequence and peripheral T2 shortening. I believe this represents a subacute intracerebral  hematoma which has become isodense on CT scan as the lesion is virtually inapparent. There is moderate surrounding vasogenic edema which accompanies extensive T2 and FLAIR hyperintensity throughout the remainder the white matter representing baseline chronic microvascular ischemic change.  The greater wing of the sphenoid on the LEFT is dehiscent. There is a gliotic nubbin of LEFT medial temporal lobe measuring 10 x 11 mm cross-section projecting into and expanded sphenoid sinus. Contained T2 hyperintense fluid as overall cross-sectional measurements considerably larger, measuring 33 x 25 mm cross-section. 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 Neurosurgical consultation is warranted.  Marked T2 hyperintensity accompanies the pons and midbrain likely extensive chronic microvascular ischemic change or. The no restricted diffusion is associated. Tiny focus of susceptibility in the RIGHT paramedian pons as seen on image 7 series 8 with slight heterogeneity of signal, could also represent a subacute bleed. Multiple micro bleeds are seen throughout both cerebral and both cerebellar hemispheres of a chronic nature.  No sinus air-fluid level. Negative orbits. Trace BILATERAL mastoid effusions. Carotid, basilar, and vertebral flow voids are maintained.  MRA HEAD FINDINGS  RIGHT ICA: Moderate dolichoectasia of the petrous segment. Minor irregularity of the cavernous segment. Supraclinoid ICA tandem stenoses estimated 50-75%. Normal appearing ICA terminus.  LEFT ICA: Minor irregularity of the petrous and cavernous segments without flow reducing lesion. No supraclinoid or ICA terminus abnormality.  Basilar artery:  Minor irregularity without flow-limiting stenosis.  Vertebral arteries: RIGHT vertebral dominant. Severe stenosis of the distal LEFT vertebral at the basilar junction, estimated 90%.  Anterior cerebral arteries: Moderate irregularity on the RIGHT. LEFT A1 ACA dominant. No  distal anterior cerebral stenosis.  Middle cerebral arteries: No proximal M1 stenosis on the RIGHT or LEFT. Moderate irregularity at the M2 and M3 segments consistent with intracranial atherosclerotic disease.  Posterior cerebral arteries: Minor irregularity without flow reducing proximal lesion.  Cerebellar arteries: Moderately diseased LEFT AICA. LEFT PICA not visualized. Normal-appearing superior cerebellar arteries.  IMPRESSION: Suspect subacute LEFT external capsule intracerebral hematoma, 11 x 15 mm cross-section. 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 Contained CSF projects within the enlarged LEFT sphenoid sinus. Neurosurgical consultation is warranted.  Extensive chronic microvascular ischemic change throughout the cerebral hemispheres and brainstem.  Potentially flow reducing stenoses of the supraclinoid ICA, distal LEFT vertebral, and cerebellar vessels.   Electronically Signed   By: Rolla Flatten M.D.   On: 10/14/2014 19:58   Dg Chest Port 1 View  10/22/2014   CLINICAL DATA:  Confusion  EXAM: PORTABLE CHEST - 1 VIEW  COMPARISON:  10/27/2014  FINDINGS: Cardiac shadow is stable. The lungs are free of acute infiltrate or sizable effusion. No acute bony abnormality is noted.  IMPRESSION: No acute abnormality noted.   Electronically Signed   By: Inez Catalina M.D.   On: 10/22/2014 13:53    Microbiology: Recent Results (from the past 240 hour(s))  MRSA PCR Screening     Status: None   Collection Time: 10/17/14 10:50 AM  Result Value Ref Range Status   MRSA by PCR NEGATIVE NEGATIVE Final    Comment:        The GeneXpert MRSA Assay (FDA approved for NASAL specimens only), is one component of a comprehensive MRSA colonization surveillance program. It is not intended to diagnose  MRSA infection nor to guide or monitor treatment for MRSA infections.   Culture, Urine     Status: None   Collection Time: 10/19/14  1:16 AM  Result Value Ref Range Status   Specimen Description URINE, RANDOM  Final   Special Requests NONE  Final   Colony Count   Final    >=100,000 COLONIES/ML Performed at [facility]    Culture   Final    Multiple bacterial morphotypes present, none predominant. Suggest appropriate recollection if clinically indicated. Performed at [facility]    Report Status 10/20/2014 FINAL  Final     Labs: Basic Metabolic Panel:  Recent Labs Lab 10/17/14 0707 10/18/14 0829 10/18/14 0839 10/19/14 1900 10/22/14 0830  NA 136 137  --  133* 129*  K 3.9 4.4  --  4.1 3.9  CL 99 98  --  96 88*  CO2 29 29  --  28 28  GLUCOSE 100* 100*  --  82 96  BUN 18 12  --  19 28*  CREATININE 6.96* 4.41*  --  7.37* 7.46*  CALCIUM 9.3 9.4  --  9.6 9.8  PHOS  --   --  3.3 5.1* 4.4   Liver Function Tests:  Recent Labs Lab 10/19/14 1900 10/22/14 0830  AST 13  --   ALT 10  --   ALKPHOS 31*  --   BILITOT 0.6  --   PROT 6.2  --   ALBUMIN 3.2* 3.1*   No results for input(s): LIPASE, AMYLASE in the last 168 hours.  Recent Labs Lab 10/20/14 1325  AMMONIA 20   CBC:  Recent Labs Lab 10/17/14 0707 10/18/14 0829 10/19/14 1900 10/22/14 0830  WBC 3.1* 3.5* 3.6* 3.5*  NEUTROABS 1.4*  --   --  0.8*  HGB 8.6* 9.3* 8.3* 8.3*  HCT 28.4* 30.9* 27.0* 25.8*  MCV 75.1* 73.9* 73.8* 71.5*  PLT 175 177 178 163   Cardiac Enzymes: No results for input(s): CKTOTAL, CKMB, CKMBINDEX, TROPONINI in the last 168 hours. BNP: BNP (last 3 results)  Recent Labs  10/14/14 1608  BNP 1512.0*    ProBNP (last 3 results) No results for input(s): PROBNP in the last 8760 hours.  CBG:  Recent Labs Lab 10/22/14 2352 10/23/14 0420 10/23/14 0551 10/23/14 0752 10/23/14 1203  GLUCAP 109* 91 79 89 104*       Signed:  THOMPSON,DANIEL MD [facility] 10/23/2014, 12:25 PM

## 2014-10-23 NOTE — Progress Notes (Signed)
Physical Therapy Treatment Patient Details Name: Sonya [name] MRN: [id number] DOB: Oct 28, 1950 Today's Date: 10/23/2014    History of Present Illness Admitted with confusion  Hypertensive emergency and encephalopathy    PT Comments    Patient able to walk more today but stated she was too tired to walk any further and was getting a headache. Family stated that they are unable to provide 24/7 assist at home and they are agreeable to SNF for ongoing therapy. Will update accordingly  Follow Up Recommendations  SNF     Equipment Recommendations       Recommendations for Other Services       Precautions / Restrictions Precautions Precautions: Fall    Mobility  Bed Mobility                  Transfers Overall transfer level: Needs assistance Equipment used: 1 person hand held assist   Sit to Stand: Min assist         General transfer comment: Min A to ensure balance and stability  Ambulation/Gait Ambulation/Gait assistance: Min assist;+2 safety/equipment Ambulation Distance (Feet): 50 Feet Assistive device: Rolling walker (2 wheeled) Gait Pattern/deviations: Step-through pattern;Decreased stride length Gait velocity: Quite slow       Stairs            Wheelchair Mobility    Modified Rankin (Stroke Patients Only)       Balance                                    Cognition Arousal/Alertness: Awake/alert Behavior During Therapy: Flat affect Overall Cognitive Status: Impaired/Different from baseline Area of Impairment: Orientation;Attention;Memory;Following commands;Safety/judgement;Awareness;Problem solving Orientation Level: Disoriented to;Place;Time;Situation Current Attention Level: Sustained Memory: Decreased recall of precautions;Decreased short-term memory Following Commands: Follows one step commands with increased time Safety/Judgement: Decreased awareness of safety;Decreased awareness of deficits   Problem Solving:  Slow processing;Decreased initiation;Difficulty sequencing;Requires verbal cues;Requires tactile cues      Exercises      General Comments        Pertinent Vitals/Pain Pain Assessment: No/denies pain    Home Living                      Prior Function            PT Goals (current goals can now be found in the care plan section) Progress towards PT goals: Progressing toward goals    Frequency  Min 3X/week    PT Plan Discharge plan needs to be updated    Co-evaluation             End of Session Equipment Utilized During Treatment: Gait belt Activity Tolerance: Patient tolerated treatment well Patient left: in chair;with call bell/phone within reach;with family/visitor present;with chair alarm set     Time: 1110-1138 PT Time Calculation (min) (ACUTE ONLY): 28 min  Charges:  $Gait Training: 8-22 mins $Therapeutic Activity: 8-22 mins                    G Codes:      Jacqualyn Posey 10/23/2014, 2:25 PM  10/23/2014 Jacqualyn Posey PTA [phone] pager [phone] office

## 2014-10-23 NOTE — Clinical Social Work Placement (Addendum)
Clinical Social Work Department CLINICAL SOCIAL WORK PLACEMENT NOTE 10/23/2014  Patient:  Sonya [name], Sonya [name]  Account Number:  [id number] [place] date:  10/17/2014  Clinical Social Worker:  Greta Doom, LCSWA  Date/time:  10/23/2014 08:37 AM  Clinical Social Work is seeking post-discharge placement for this patient at the following level of care:   SKILLED NURSING   (*CSW will update this form in Epic as items are completed)   10/23/2014  Patient/family provided with [place] Department of Clinical Social Work's list of facilities offering this level of care within the geographic area requested by the patient (or if unable, by the patient's family).  10/23/2014  Patient/family informed of their freedom to choose among providers that offer the needed level of care, that participate in Medicare, Medicaid or managed care program needed by the patient, have an available bed and are willing to accept the patient.  10/23/2014  Patient/family informed of [facility]' ownership interest in [facility], as well as of the fact that they are under no obligation to receive care at this facility.  PASARR submitted to EDS on 10/23/2014 PASARR number received on 10/23/2014  FL2 transmitted to all facilities in geographic area requested by pt/family on  10/23/2014 FL2 transmitted to all facilities within larger geographic area on 10/23/2014  Patient informed that his/her managed care company has contracts with or will negotiate with  certain facilities, including the following:     Patient/family informed of bed offers received:  10/23/2014 Patient chooses bed at [facility]  Physician recommends and patient chooses bed at    Patient to be transferred to [facility] on  10/23/2014 Patient to be transferred to facility by [facility]  Patient and family notified of transfer on 10/23/2014  Name of family member notified: Barnetta Hammersmith, daughter    The following physician  request were entered in Epic:   Additional Comments:   Greta Doom, MSW, [place]

## 2014-10-23 NOTE — Telephone Encounter (Signed)
Letter mailed to pt to call.  

## 2014-10-24 DIAGNOSIS — Z992 Dependence on renal dialysis: Secondary | ICD-10-CM | POA: Diagnosis not present

## 2014-10-24 DIAGNOSIS — Z23 Encounter for immunization: Secondary | ICD-10-CM | POA: Diagnosis not present

## 2014-10-24 DIAGNOSIS — D631 Anemia in chronic kidney disease: Secondary | ICD-10-CM | POA: Diagnosis not present

## 2014-10-24 DIAGNOSIS — N2581 Secondary hyperparathyroidism of renal origin: Secondary | ICD-10-CM | POA: Diagnosis not present

## 2014-10-24 DIAGNOSIS — D509 Iron deficiency anemia, unspecified: Secondary | ICD-10-CM | POA: Diagnosis not present

## 2014-10-24 DIAGNOSIS — N186 End stage renal disease: Secondary | ICD-10-CM | POA: Diagnosis not present

## 2014-10-26 DIAGNOSIS — D509 Iron deficiency anemia, unspecified: Secondary | ICD-10-CM | POA: Diagnosis not present

## 2014-10-26 DIAGNOSIS — Z992 Dependence on renal dialysis: Secondary | ICD-10-CM | POA: Diagnosis not present

## 2014-10-26 DIAGNOSIS — D631 Anemia in chronic kidney disease: Secondary | ICD-10-CM | POA: Diagnosis not present

## 2014-10-26 DIAGNOSIS — Z23 Encounter for immunization: Secondary | ICD-10-CM | POA: Diagnosis not present

## 2014-10-26 DIAGNOSIS — N186 End stage renal disease: Secondary | ICD-10-CM | POA: Diagnosis not present

## 2014-10-26 DIAGNOSIS — N2581 Secondary hyperparathyroidism of renal origin: Secondary | ICD-10-CM | POA: Diagnosis not present

## 2014-10-29 DIAGNOSIS — Z992 Dependence on renal dialysis: Secondary | ICD-10-CM | POA: Diagnosis not present

## 2014-10-29 DIAGNOSIS — N2581 Secondary hyperparathyroidism of renal origin: Secondary | ICD-10-CM | POA: Diagnosis not present

## 2014-10-29 DIAGNOSIS — N186 End stage renal disease: Secondary | ICD-10-CM | POA: Diagnosis not present

## 2014-10-29 DIAGNOSIS — D509 Iron deficiency anemia, unspecified: Secondary | ICD-10-CM | POA: Diagnosis not present

## 2014-10-29 DIAGNOSIS — D631 Anemia in chronic kidney disease: Secondary | ICD-10-CM | POA: Diagnosis not present

## 2014-10-31 ENCOUNTER — Telehealth: Payer: Self-pay | Admitting: Gastroenterology

## 2014-10-31 ENCOUNTER — Encounter: Payer: Self-pay | Admitting: Gastroenterology

## 2014-10-31 DIAGNOSIS — D509 Iron deficiency anemia, unspecified: Secondary | ICD-10-CM | POA: Diagnosis not present

## 2014-10-31 DIAGNOSIS — D631 Anemia in chronic kidney disease: Secondary | ICD-10-CM | POA: Diagnosis not present

## 2014-10-31 DIAGNOSIS — I613 Nontraumatic intracerebral hemorrhage in brain stem: Secondary | ICD-10-CM | POA: Insufficient documentation

## 2014-10-31 DIAGNOSIS — Z992 Dependence on renal dialysis: Secondary | ICD-10-CM | POA: Diagnosis not present

## 2014-10-31 DIAGNOSIS — N2581 Secondary hyperparathyroidism of renal origin: Secondary | ICD-10-CM | POA: Diagnosis not present

## 2014-10-31 DIAGNOSIS — N186 End stage renal disease: Secondary | ICD-10-CM | POA: Diagnosis not present

## 2014-10-31 NOTE — Telephone Encounter (Signed)
APPOINTMENT MADE AND LETTER SENT °

## 2014-10-31 NOTE — Telephone Encounter (Signed)
Patient needs hospital f/u to schedule EGD for dysphagia.

## 2014-11-02 DIAGNOSIS — D631 Anemia in chronic kidney disease: Secondary | ICD-10-CM | POA: Diagnosis not present

## 2014-11-02 DIAGNOSIS — Z992 Dependence on renal dialysis: Secondary | ICD-10-CM | POA: Diagnosis not present

## 2014-11-02 DIAGNOSIS — N186 End stage renal disease: Secondary | ICD-10-CM | POA: Diagnosis not present

## 2014-11-02 DIAGNOSIS — N2581 Secondary hyperparathyroidism of renal origin: Secondary | ICD-10-CM | POA: Diagnosis not present

## 2014-11-02 DIAGNOSIS — D509 Iron deficiency anemia, unspecified: Secondary | ICD-10-CM | POA: Diagnosis not present

## 2014-11-05 DIAGNOSIS — D509 Iron deficiency anemia, unspecified: Secondary | ICD-10-CM | POA: Diagnosis not present

## 2014-11-05 DIAGNOSIS — Z992 Dependence on renal dialysis: Secondary | ICD-10-CM | POA: Diagnosis not present

## 2014-11-05 DIAGNOSIS — N2581 Secondary hyperparathyroidism of renal origin: Secondary | ICD-10-CM | POA: Diagnosis not present

## 2014-11-05 DIAGNOSIS — N186 End stage renal disease: Secondary | ICD-10-CM | POA: Diagnosis not present

## 2014-11-05 DIAGNOSIS — D631 Anemia in chronic kidney disease: Secondary | ICD-10-CM | POA: Diagnosis not present

## 2014-11-07 DIAGNOSIS — D509 Iron deficiency anemia, unspecified: Secondary | ICD-10-CM | POA: Diagnosis not present

## 2014-11-07 DIAGNOSIS — N186 End stage renal disease: Secondary | ICD-10-CM | POA: Diagnosis not present

## 2014-11-07 DIAGNOSIS — D631 Anemia in chronic kidney disease: Secondary | ICD-10-CM | POA: Diagnosis not present

## 2014-11-07 DIAGNOSIS — N2581 Secondary hyperparathyroidism of renal origin: Secondary | ICD-10-CM | POA: Diagnosis not present

## 2014-11-07 DIAGNOSIS — Z992 Dependence on renal dialysis: Secondary | ICD-10-CM | POA: Diagnosis not present

## 2014-11-09 DIAGNOSIS — N186 End stage renal disease: Secondary | ICD-10-CM | POA: Diagnosis not present

## 2014-11-09 DIAGNOSIS — N2581 Secondary hyperparathyroidism of renal origin: Secondary | ICD-10-CM | POA: Diagnosis not present

## 2014-11-09 DIAGNOSIS — D631 Anemia in chronic kidney disease: Secondary | ICD-10-CM | POA: Diagnosis not present

## 2014-11-09 DIAGNOSIS — Z992 Dependence on renal dialysis: Secondary | ICD-10-CM | POA: Diagnosis not present

## 2014-11-09 DIAGNOSIS — D509 Iron deficiency anemia, unspecified: Secondary | ICD-10-CM | POA: Diagnosis not present

## 2014-11-12 DIAGNOSIS — N2581 Secondary hyperparathyroidism of renal origin: Secondary | ICD-10-CM | POA: Diagnosis not present

## 2014-11-12 DIAGNOSIS — D631 Anemia in chronic kidney disease: Secondary | ICD-10-CM | POA: Diagnosis not present

## 2014-11-12 DIAGNOSIS — D509 Iron deficiency anemia, unspecified: Secondary | ICD-10-CM | POA: Diagnosis not present

## 2014-11-12 DIAGNOSIS — Z992 Dependence on renal dialysis: Secondary | ICD-10-CM | POA: Diagnosis not present

## 2014-11-12 DIAGNOSIS — N186 End stage renal disease: Secondary | ICD-10-CM | POA: Diagnosis not present

## 2014-11-14 DIAGNOSIS — Z992 Dependence on renal dialysis: Secondary | ICD-10-CM | POA: Diagnosis not present

## 2014-11-14 DIAGNOSIS — N2581 Secondary hyperparathyroidism of renal origin: Secondary | ICD-10-CM | POA: Diagnosis not present

## 2014-11-14 DIAGNOSIS — N186 End stage renal disease: Secondary | ICD-10-CM | POA: Diagnosis not present

## 2014-11-14 DIAGNOSIS — D509 Iron deficiency anemia, unspecified: Secondary | ICD-10-CM | POA: Diagnosis not present

## 2014-11-14 DIAGNOSIS — D631 Anemia in chronic kidney disease: Secondary | ICD-10-CM | POA: Diagnosis not present

## 2014-11-16 DIAGNOSIS — D631 Anemia in chronic kidney disease: Secondary | ICD-10-CM | POA: Diagnosis not present

## 2014-11-16 DIAGNOSIS — N2581 Secondary hyperparathyroidism of renal origin: Secondary | ICD-10-CM | POA: Diagnosis not present

## 2014-11-16 DIAGNOSIS — N186 End stage renal disease: Secondary | ICD-10-CM | POA: Diagnosis not present

## 2014-11-16 DIAGNOSIS — Z992 Dependence on renal dialysis: Secondary | ICD-10-CM | POA: Diagnosis not present

## 2014-11-16 DIAGNOSIS — D509 Iron deficiency anemia, unspecified: Secondary | ICD-10-CM | POA: Diagnosis not present

## 2014-11-19 DIAGNOSIS — D631 Anemia in chronic kidney disease: Secondary | ICD-10-CM | POA: Diagnosis not present

## 2014-11-19 DIAGNOSIS — N186 End stage renal disease: Secondary | ICD-10-CM | POA: Diagnosis not present

## 2014-11-19 DIAGNOSIS — Z992 Dependence on renal dialysis: Secondary | ICD-10-CM | POA: Diagnosis not present

## 2014-11-19 DIAGNOSIS — N2581 Secondary hyperparathyroidism of renal origin: Secondary | ICD-10-CM | POA: Diagnosis not present

## 2014-11-19 DIAGNOSIS — D509 Iron deficiency anemia, unspecified: Secondary | ICD-10-CM | POA: Diagnosis not present

## 2014-11-20 ENCOUNTER — Ambulatory Visit: Payer: No Typology Code available for payment source | Admitting: Gastroenterology

## 2014-11-20 ENCOUNTER — Encounter: Payer: Self-pay | Admitting: Gastroenterology

## 2014-11-21 DIAGNOSIS — N186 End stage renal disease: Secondary | ICD-10-CM | POA: Diagnosis not present

## 2014-11-21 DIAGNOSIS — D509 Iron deficiency anemia, unspecified: Secondary | ICD-10-CM | POA: Diagnosis not present

## 2014-11-21 DIAGNOSIS — Z992 Dependence on renal dialysis: Secondary | ICD-10-CM | POA: Diagnosis not present

## 2014-11-21 DIAGNOSIS — N2581 Secondary hyperparathyroidism of renal origin: Secondary | ICD-10-CM | POA: Diagnosis not present

## 2014-11-22 DIAGNOSIS — Z6822 Body mass index (BMI) 22.0-22.9, adult: Secondary | ICD-10-CM | POA: Diagnosis not present

## 2014-11-22 DIAGNOSIS — M1991 Primary osteoarthritis, unspecified site: Secondary | ICD-10-CM | POA: Diagnosis not present

## 2014-11-22 DIAGNOSIS — I629 Nontraumatic intracranial hemorrhage, unspecified: Secondary | ICD-10-CM | POA: Diagnosis not present

## 2014-11-22 DIAGNOSIS — I10 Essential (primary) hypertension: Secondary | ICD-10-CM | POA: Diagnosis not present

## 2014-11-22 DIAGNOSIS — H81399 Other peripheral vertigo, unspecified ear: Secondary | ICD-10-CM | POA: Diagnosis not present

## 2014-11-22 DIAGNOSIS — I639 Cerebral infarction, unspecified: Secondary | ICD-10-CM | POA: Diagnosis not present

## 2014-11-23 DIAGNOSIS — N186 End stage renal disease: Secondary | ICD-10-CM | POA: Diagnosis not present

## 2014-11-23 DIAGNOSIS — D509 Iron deficiency anemia, unspecified: Secondary | ICD-10-CM | POA: Diagnosis not present

## 2014-11-23 DIAGNOSIS — Z992 Dependence on renal dialysis: Secondary | ICD-10-CM | POA: Diagnosis not present

## 2014-11-23 DIAGNOSIS — N2581 Secondary hyperparathyroidism of renal origin: Secondary | ICD-10-CM | POA: Diagnosis not present

## 2014-11-25 ENCOUNTER — Inpatient Hospital Stay (HOSPITAL_COMMUNITY)
Admission: EM | Admit: 2014-11-25 | Discharge: 2014-11-26 | DRG: 682 | Disposition: A | Discharge status: Home / Self Care | Payer: Medicare Other | Attending: Internal Medicine | Admitting: Internal Medicine

## 2014-11-25 ENCOUNTER — Inpatient Hospital Stay (HOSPITAL_COMMUNITY): Payer: Medicare Other

## 2014-11-25 ENCOUNTER — Emergency Department (HOSPITAL_COMMUNITY): Payer: Medicare Other

## 2014-11-25 ENCOUNTER — Encounter (HOSPITAL_COMMUNITY): Payer: Self-pay | Admitting: Emergency Medicine

## 2014-11-25 DIAGNOSIS — D631 Anemia in chronic kidney disease: Secondary | ICD-10-CM | POA: Diagnosis present

## 2014-11-25 DIAGNOSIS — Z992 Dependence on renal dialysis: Secondary | ICD-10-CM | POA: Diagnosis not present

## 2014-11-25 DIAGNOSIS — Z8249 Family history of ischemic heart disease and other diseases of the circulatory system: Secondary | ICD-10-CM

## 2014-11-25 DIAGNOSIS — Z9114 Patient's other noncompliance with medication regimen: Secondary | ICD-10-CM | POA: Diagnosis present

## 2014-11-25 DIAGNOSIS — Z8542 Personal history of malignant neoplasm of other parts of uterus: Secondary | ICD-10-CM

## 2014-11-25 DIAGNOSIS — I614 Nontraumatic intracerebral hemorrhage in cerebellum: Secondary | ICD-10-CM

## 2014-11-25 DIAGNOSIS — C55 Malignant neoplasm of uterus, part unspecified: Secondary | ICD-10-CM | POA: Diagnosis not present

## 2014-11-25 DIAGNOSIS — R42 Dizziness and giddiness: Secondary | ICD-10-CM

## 2014-11-25 DIAGNOSIS — N186 End stage renal disease: Secondary | ICD-10-CM | POA: Diagnosis not present

## 2014-11-25 DIAGNOSIS — E039 Hypothyroidism, unspecified: Secondary | ICD-10-CM | POA: Diagnosis present

## 2014-11-25 DIAGNOSIS — Z833 Family history of diabetes mellitus: Secondary | ICD-10-CM

## 2014-11-25 DIAGNOSIS — D638 Anemia in other chronic diseases classified elsewhere: Secondary | ICD-10-CM | POA: Diagnosis present

## 2014-11-25 DIAGNOSIS — I10 Essential (primary) hypertension: Secondary | ICD-10-CM | POA: Diagnosis present

## 2014-11-25 DIAGNOSIS — E43 Unspecified severe protein-calorie malnutrition: Secondary | ICD-10-CM | POA: Diagnosis present

## 2014-11-25 DIAGNOSIS — I619 Nontraumatic intracerebral hemorrhage, unspecified: Secondary | ICD-10-CM | POA: Diagnosis present

## 2014-11-25 DIAGNOSIS — I120 Hypertensive chronic kidney disease with stage 5 chronic kidney disease or end stage renal disease: Secondary | ICD-10-CM | POA: Diagnosis present

## 2014-11-25 DIAGNOSIS — Z809 Family history of malignant neoplasm, unspecified: Secondary | ICD-10-CM | POA: Diagnosis not present

## 2014-11-25 DIAGNOSIS — R51 Headache: Secondary | ICD-10-CM | POA: Diagnosis not present

## 2014-11-25 DIAGNOSIS — N189 Chronic kidney disease, unspecified: Secondary | ICD-10-CM

## 2014-11-25 LAB — URINALYSIS, ROUTINE W REFLEX MICROSCOPIC
Bilirubin Urine: NEGATIVE
GLUCOSE, UA: 100 mg/dL — AB
Ketones, ur: NEGATIVE mg/dL
NITRITE: NEGATIVE
Protein, ur: 100 mg/dL — AB
SPECIFIC GRAVITY, URINE: 1.02 (ref 1.005–1.030)
UROBILINOGEN UA: 0.2 mg/dL (ref 0.0–1.0)
pH: 7.5 (ref 5.0–8.0)

## 2014-11-25 LAB — BASIC METABOLIC PANEL
Anion gap: 12 (ref 5–15)
BUN: 38 mg/dL — AB (ref 6–23)
CO2: 25 mmol/L (ref 19–32)
Calcium: 9.6 mg/dL (ref 8.4–10.5)
Chloride: 100 mmol/L (ref 96–112)
Creatinine, Ser: 6.69 mg/dL — ABNORMAL HIGH (ref 0.50–1.10)
GFR calc Af Amer: 7 mL/min — ABNORMAL LOW (ref >90)
GFR calc non Af Amer: 6 mL/min — ABNORMAL LOW (ref >90)
Glucose, Bld: 95 mg/dL (ref 70–99)
Potassium: 3.9 mmol/L (ref 3.5–5.1)
Sodium: 137 mmol/L (ref 135–145)

## 2014-11-25 LAB — CBC WITH DIFFERENTIAL/PLATELET
BASOS PCT: 1 % (ref 0–1)
Basophils Absolute: 0 10*3/uL (ref 0.0–0.1)
EOS ABS: 0.1 10*3/uL (ref 0.0–0.7)
Eosinophils Relative: 2 % (ref 0–5)
HCT: 33.5 % — ABNORMAL LOW (ref 36.0–46.0)
HEMOGLOBIN: 10.3 g/dL — AB (ref 12.0–15.0)
LYMPHS ABS: 1.3 10*3/uL (ref 0.7–4.0)
Lymphocytes Relative: 27 % (ref 12–46)
MCH: 23.7 pg — ABNORMAL LOW (ref 26.0–34.0)
MCHC: 30.7 g/dL (ref 30.0–36.0)
MCV: 77 fL — AB (ref 78.0–100.0)
MONO ABS: 0.4 10*3/uL (ref 0.1–1.0)
Monocytes Relative: 8 % (ref 3–12)
Neutro Abs: 2.9 10*3/uL (ref 1.7–7.7)
Neutrophils Relative %: 62 % (ref 43–77)
PLATELETS: 230 10*3/uL (ref 150–400)
RBC: 4.35 MIL/uL (ref 3.87–5.11)
RDW: 21.2 % — AB (ref 11.5–15.5)
WBC: 4.6 10*3/uL (ref 4.0–10.5)

## 2014-11-25 LAB — URINE MICROSCOPIC-ADD ON

## 2014-11-25 LAB — MRSA PCR SCREENING: MRSA by PCR: NEGATIVE

## 2014-11-25 LAB — TROPONIN I

## 2014-11-25 MED ORDER — SEVELAMER CARBONATE 800 MG PO TABS
2400.0000 mg | ORAL_TABLET | Freq: Three times a day (TID) | ORAL | Status: DC
  Administered 2014-11-25 – 2014-11-26 (×3): 2400 mg via ORAL
  Filled 2014-11-25 (×4): qty 3

## 2014-11-25 MED ORDER — HYDRALAZINE HCL 20 MG/ML IJ SOLN
10.0000 mg | Freq: Once | INTRAMUSCULAR | Status: AC
  Administered 2014-11-25: 10 mg via INTRAVENOUS
  Filled 2014-11-25: qty 1

## 2014-11-25 MED ORDER — SEVELAMER CARBONATE 800 MG PO TABS: 800.0000 mg | ORAL_TABLET | Freq: Two times a day (BID) | ORAL | Status: DC | PRN

## 2014-11-25 MED ORDER — CLONIDINE HCL 0.2 MG PO TABS
0.2000 mg | ORAL_TABLET | Freq: Three times a day (TID) | ORAL | Status: DC
  Administered 2014-11-25 – 2014-11-26 (×3): 0.2 mg via ORAL
  Filled 2014-11-25 (×3): qty 1

## 2014-11-25 MED ORDER — MECLIZINE HCL 12.5 MG PO TABS
25.0000 mg | ORAL_TABLET | Freq: Four times a day (QID) | ORAL | Status: DC | PRN
  Administered 2014-11-26: 25 mg via ORAL
  Filled 2014-11-25: qty 2

## 2014-11-25 MED ORDER — NEPRO/CARBSTEADY PO LIQD: 237.0000 mL | ORAL | Status: DC | PRN

## 2014-11-25 MED ORDER — SODIUM CHLORIDE 0.9 % IV SOLN: 250.0000 mL | INTRAVENOUS | Status: DC | PRN

## 2014-11-25 MED ORDER — HYDRALAZINE HCL 25 MG PO TABS
50.0000 mg | ORAL_TABLET | Freq: Three times a day (TID) | ORAL | Status: DC
  Administered 2014-11-25 – 2014-11-26 (×3): 50 mg via ORAL
  Filled 2014-11-25 (×3): qty 2

## 2014-11-25 MED ORDER — HYDRALAZINE HCL 20 MG/ML IJ SOLN: 10.0000 mg | Freq: Four times a day (QID) | INTRAMUSCULAR | Status: DC | PRN

## 2014-11-25 MED ORDER — LINACLOTIDE 145 MCG PO CAPS
290.0000 ug | ORAL_CAPSULE | Freq: Every morning | ORAL | Status: DC
  Administered 2014-11-25 – 2014-11-26 (×2): 290 ug via ORAL
  Filled 2014-11-25 (×3): qty 1

## 2014-11-25 MED ORDER — LABETALOL HCL 200 MG PO TABS
300.0000 mg | ORAL_TABLET | Freq: Three times a day (TID) | ORAL | Status: DC
  Administered 2014-11-25 – 2014-11-26 (×3): 300 mg via ORAL
  Filled 2014-11-25 (×3): qty 2

## 2014-11-25 MED ORDER — ALLOPURINOL 300 MG PO TABS
300.0000 mg | ORAL_TABLET | Freq: Every day | ORAL | Status: DC
  Administered 2014-11-25 – 2014-11-26 (×2): 300 mg via ORAL
  Filled 2014-11-25 (×2): qty 1

## 2014-11-25 MED ORDER — LIDOCAINE-PRILOCAINE 2.5-2.5 % EX CREA
1.0000 "application " | TOPICAL_CREAM | CUTANEOUS | Status: DC | PRN
  Filled 2014-11-25: qty 5

## 2014-11-25 MED ORDER — SODIUM CHLORIDE 0.9 % IJ SOLN: 3.0000 mL | INTRAMUSCULAR | Status: DC | PRN

## 2014-11-25 MED ORDER — ENSURE ENLIVE PO LIQD
237.0000 mL | ORAL | Status: DC
  Administered 2014-11-25: 237 mL via ORAL

## 2014-11-25 MED ORDER — FERROUS SULFATE CR 160 (50 FE) MG PO TBCR: 640.0000 mg | EXTENDED_RELEASE_TABLET | Freq: Every day | ORAL | Status: DC

## 2014-11-25 MED ORDER — FERROUS SULFATE 325 (65 FE) MG PO TABS
325.0000 mg | ORAL_TABLET | Freq: Two times a day (BID) | ORAL | Status: DC
  Administered 2014-11-25 – 2014-11-26 (×2): 325 mg via ORAL
  Filled 2014-11-25 (×2): qty 1

## 2014-11-25 MED ORDER — DOCUSATE SODIUM 100 MG PO CAPS
100.0000 mg | ORAL_CAPSULE | Freq: Two times a day (BID) | ORAL | Status: DC
  Administered 2014-11-25 – 2014-11-26 (×3): 100 mg via ORAL
  Filled 2014-11-25 (×3): qty 1

## 2014-11-25 MED ORDER — SODIUM BICARBONATE 650 MG PO TABS
650.0000 mg | ORAL_TABLET | Freq: Two times a day (BID) | ORAL | Status: DC
  Administered 2014-11-25 – 2014-11-26 (×3): 650 mg via ORAL
  Filled 2014-11-25 (×3): qty 1

## 2014-11-25 MED ORDER — SODIUM CHLORIDE 0.9 % IJ SOLN
3.0000 mL | Freq: Two times a day (BID) | INTRAMUSCULAR | Status: DC
  Administered 2014-11-26: 3 mL via INTRAVENOUS

## 2014-11-25 MED ORDER — LEVOTHYROXINE SODIUM 25 MCG PO TABS
50.0000 ug | ORAL_TABLET | Freq: Every day | ORAL | Status: DC
  Administered 2014-11-26: 50 ug via ORAL
  Filled 2014-11-25: qty 2

## 2014-11-25 MED ORDER — PANTOPRAZOLE SODIUM 40 MG PO TBEC
40.0000 mg | DELAYED_RELEASE_TABLET | Freq: Every day | ORAL | Status: DC
  Administered 2014-11-25 – 2014-11-26 (×2): 40 mg via ORAL
  Filled 2014-11-25 (×2): qty 1

## 2014-11-25 NOTE — ED Notes (Signed)
Pt reports dizziness x 1 week. Was seen at [facility]. Pt saw PCP on Fri. Pt had elevated BP and dizziness.

## 2014-11-25 NOTE — H&P (Addendum)
[facility] History and Physical  Sonya [name] [id number] DOB: September 22, 1950 DOA: 11/25/2014  Referring physician: Dr. Jaci Carrel, EDP PCP: [name], MD  Specialists:   Chief Complaint: Dizziness  HPI: Sonya [name] is a 64 y.o. female with a history of hypertension, hypothyroidism, recent intracerebral bleed, end-stage renal disease who presented to the emergency department with complaints of dizziness and vertigo. Patient states it started approximately one week ago when she felt that the room was spinning. She also admits that she's had a history of this and has not been getting her meclizine. Patient was recently admitted to [facility] rehabilitation when her dizziness started. She stated she was not receiving her "intrauterine medication" at the rehabilitation facility. Patient decided to check herself out. She then presented to her primary care physician's office, on Friday. At that time she was found to have uncontrolled blood pressure and was started on another medication. Patient denies any headache or changes in vision. She denies any chest pain or shortness of breath, abdominal pain, recent illness or travel. In the emergency department, patient's blood pressure was initially found to be 238/97. Patient was given 2 doses of hydralazine 10 mg IV. Her blood pressure has improved to 187/77. CT of the head was also conducted showing no acute intracranial abnormality. [facility] asked to admit.  Review of Systems:  Constitutional: Denies fever, chills, diaphoresis, appetite change and fatigue.  HEENT: Denies photophobia, eye pain, redness, hearing loss, ear pain, congestion, sore throat, rhinorrhea, sneezing, mouth sores, trouble swallowing, neck pain, neck stiffness and tinnitus.   Respiratory: Denies SOB, DOE, cough, chest tightness,  and wheezing.   Cardiovascular: Denies chest pain, palpitations and leg swelling.  Gastrointestinal: Denies nausea, vomiting, abdominal  pain, diarrhea, constipation, blood in stool and abdominal distention.  Genitourinary: Denies dysuria, urgency, frequency, hematuria, flank pain and difficulty urinating.  Musculoskeletal: Denies myalgias, back pain, joint swelling, arthralgias and gait problem.  Skin: Denies pallor, rash and wound.  Neurological: Dizziness.  Hematological: Denies adenopathy. Easy bruising, personal or family bleeding history  Psychiatric/Behavioral: Denies suicidal ideation, mood changes, confusion, nervousness, sleep disturbance and agitation  Past Medical History  Diagnosis Date  . HTN (hypertension)   . Hypothyroidism   . Uterine cancer 2012  . Anemia   . CKD (chronic kidney disease)   . Dysphagia   . Hyperparathyroidism due to renal insufficiency   . GERD (gastroesophageal reflux disease)   . Diverticulitis    Past Surgical History  Procedure Laterality Date  . Cholecystectomy    . Cesarean section    . Laparoscopic total hysterectomy    . Colonoscopy  2000    [facility], [facility]  . Colonoscopy  2003 NUR BRBPR D50 V6    [facility] [facility], [facility]  . Abdominal hysterectomy    . Nasal septum surgery    . Esophagogastroduodenoscopy (egd) with esophageal dilation N/A 06/08/2013    Dr. Fields:Stricture at the gastroesophageal junction/MILD NON-erosive gastritis (inflammation) was found in the gastric antrum; multiple biopsies/NO SOURCE FOR ANEMIA IDETIFIED-MOST LIKELY DUE TO ANEMIA OF CHRONIC DISEASE  . Colonoscopy N/A 09/04/2013    Procedure: COLONOSCOPY;  Surgeon: West Bali, MD;  Location: [facility];  Service: Endoscopy;  Laterality: N/A;  10:30AM-moved to 9:25 Soledad Gerlach to notify pt  . Appendectomy    . Bascilic vein transposition Right 09/17/2013    Procedure: BRACHIAL VEIN TRANSPOSITION;  Surgeon: Fransisco Hertz, MD;  Location: [facility] OR;  Service: Vascular;  Laterality: Right;  . Insertion of dialysis catheter Left  09/17/2013    Procedure: INSERTION OF DIALYSIS CATHETER;  Surgeon: Fransisco Hertz, MD;  Location: [facility]  OR;  Service: Vascular;  Laterality: Left;  . Bascilic vein transposition Right 10/29/2013    Procedure: RIGHT SECOND STAGE BRACHIAL VEIN TRANSPOSITION;  Surgeon: Fransisco Hertz, MD;  Location: [place] OR;  Service: Vascular;  Laterality: Right;  . Givens capsule study N/A 12/03/2013    Procedure: GIVENS CAPSULE STUDY;  Surgeon: West Bali, MD;  Location: [facility];  Service: Endoscopy;  Laterality: N/A;  7:30   Social History:  reports that she has never smoked. She has never used smokeless tobacco. She reports that she does not drink alcohol or use illicit drugs.   Allergies  Allergen Reactions  . Cephalexin Itching    Family History  Problem Relation Age of Onset  . Colon cancer Neg Hx   . Cancer Mother   . Diabetes Mother   . Hypertension Mother   . Diabetes Father   . Hypertension Father   . Hypertension Sister     Prior to Admission medications   Medication Sig Start Date End Date Taking? Authorizing Provider  allopurinol (ZYLOPRIM) 300 MG tablet Take 300 mg by mouth daily.   Yes Historical Provider, MD  cloNIDine (CATAPRES) 0.2 MG tablet Take 1 tablet by mouth 3 (three) times daily. 11/22/14  Yes Historical Provider, MD  docusate sodium (COLACE) 100 MG capsule Take 100 mg by mouth 2 (two) times daily.   Yes Historical Provider, MD  feeding supplement, ENSURE ENLIVE, (ENSURE ENLIVE) LIQD Take 237 mLs by mouth daily. 10/23/14  Yes Rodolph Bong, MD  ferrous sulfate dried (SLOW FE) 160 (50 FE) MG TBCR SR tablet Take 640 mg by mouth daily.   Yes Historical Provider, MD  hydrALAZINE (APRESOLINE) 50 MG tablet Take 1 tablet (50 mg total) by mouth every 8 (eight) hours. 10/22/14  Yes Richarda Overlie, MD  labetalol (NORMODYNE) 300 MG tablet Take 1 tablet (300 mg total) by mouth 3 (three) times daily. 08/20/13  Yes Esperanza Sheets, MD  levothyroxine (SYNTHROID, LEVOTHROID) 50 MCG tablet Take 1 tablet (50 mcg total) by mouth daily. 08/20/13  Yes Esperanza Sheets, MD  lidocaine-prilocaine  (EMLA) cream Apply 1 application topically as needed (on Dialysis days Tues,Thurs.,Sat.).   Yes Historical Provider, MD  Linaclotide (LINZESS) 290 MCG CAPS capsule 1 PO 30 MINS PRIOR TO BREAKFAST. IF O SATISFACTORY BM AFTER 2 WEEKS, TAKE WITH BREAKFAST. Patient taking differently: Take 290 mcg by mouth every morning.  05/01/14  Yes West Bali, MD  meclizine (ANTIVERT) 25 MG tablet Take 1 tablet by mouth 4 (four) times daily as needed. dizziness 11/22/14  Yes Historical Provider, MD  Nutritional Supplements (FEEDING SUPPLEMENT, NEPRO CARB STEADY,) LIQD Take 237 mLs by mouth as needed (missed meal during dialysis.). 10/22/14  Yes Richarda Overlie, MD  omeprazole (PRILOSEC) 20 MG capsule Take 1 capsule (20 mg total) by mouth daily. 07/29/14  Yes Nira Retort, NP  sevelamer carbonate (RENVELA) 800 MG tablet Take 800-2,400 mg by mouth 3 (three) times daily with meals. Patient takes 1 tablet with snack and 3 tablets with meal   Yes Historical Provider, MD  sodium bicarbonate 650 MG tablet Take 1 tablet (650 mg total) by mouth 2 (two) times daily. 06/12/13  Yes Erick Blinks, MD  feeding supplement, RESOURCE BREEZE, (RESOURCE BREEZE) LIQD Take 1 Container by mouth daily. Patient not taking: Reported on 11/25/2014 10/23/14   Rodolph Bong, MD   Physical  Exam: Filed Vitals:   11/25/14 1030  BP: 187/77  Pulse: 72  Temp:   Resp: 20     General: Well developed, well nourished, NAD, appears stated age  HEENT: NCAT, PERRLA, EOMI, Anicteic Sclera, mucous membranes moist.   Neck: Supple, no JVD, no masses  Cardiovascular: S1 S2 auscultated, no rubs, murmurs or gallops. Regular rate and rhythm.  Respiratory: Clear to auscultation bilaterally with equal chest rise  Abdomen: Soft, nontender, nondistended, + bowel sounds  Extremities: warm dry without cyanosis clubbing or edema  Neuro: AAOx3, cranial nerves grossly intact. Strength equal and bilateral in upper/lower ext  Skin: Without rashes exudates  or nodules  Psych: Normal affect and demeanor with intact judgement and insight  Labs on Admission:  Basic Metabolic Panel:  Recent Labs Lab 11/25/14 0850  NA 137  K 3.9  CL 100  CO2 25  GLUCOSE 95  BUN 38*  CREATININE 6.69*  CALCIUM 9.6   Liver Function Tests: No results for input(s): AST, ALT, ALKPHOS, BILITOT, PROT, ALBUMIN in the last 168 hours. No results for input(s): LIPASE, AMYLASE in the last 168 hours. No results for input(s): AMMONIA in the last 168 hours. CBC:  Recent Labs Lab 11/25/14 0823  WBC 4.6  NEUTROABS 2.9  HGB 10.3*  HCT 33.5*  MCV 77.0*  PLT 230   Cardiac Enzymes:  Recent Labs Lab 11/25/14 0850  TROPONINI <0.03    BNP (last 3 results)  Recent Labs  10/14/14 1608  BNP 1512.0*    ProBNP (last 3 results) No results for input(s): PROBNP in the last 8760 hours.  CBG: No results for input(s): GLUCAP in the last 168 hours.  Radiological Exams on Admission: Ct Head Wo Contrast  11/25/2014   CLINICAL DATA:  Dizziness. Onset of symptoms this morning. Initial encounter.  EXAM: CT HEAD WITHOUT CONTRAST  TECHNIQUE: Contiguous axial images were obtained from the base of the skull through the vertex without intravenous contrast.  COMPARISON:  MRI brain 10/19/2014.  FINDINGS: No mass lesion, mass effect, midline shift, hydrocephalus, hemorrhage. No acute territorial cortical ischemia/infarct. Atrophy and chronic ischemic white matter disease is present. Encephalomalacia is present in the LEFT basal ganglia compatible with recent infarct. This is difficult to distinguish from adjacent chronic ischemic white matter disease. Chronic bilateral thalamic lacunar infarcts. LEFT Middle cranial fossa encephalocele without interval change.  IMPRESSION: Atrophy and chronic ischemic white matter disease without acute intracranial abnormality.   Electronically Signed   By: Andreas Newport M.D.   On: 11/25/2014 09:18    EKG: Independently reviewed. Sinus rhythm,  rate 76, LVH, bialteral atrial enlargement.  No change from prior EKG.  Assessment/Plan  Accelerated Hypertension -Patient will be admitted to step down unit -Upon arrival to the ED, BP 238/97; currently 187/77 -Will continue patient's home meds: Clonidine, hydralazine, labetolol -Patient has had multiple admissions for uncontrolled hypertension -Will add on IV meds or drip if needed  Vertigo -Patient states that she has had vertigo approximately one week however has not been receiving meclizine. -CT head: No acute intracranial abnormality  -Will obtain MRI of the brain -Will consult physical therapy -Will continue and restart meclizine  ESRD, on hemodialysis -Will consult nephrology for dialysis, patient dialyzes on Tuesday, Thursday, Saturday  Recent intracranial bleed -CT of the head: No acute intracranial abnormality -In March 2016, neurosurgery did not feel patient needed surgical intervention at that time -Will obtain MRI as patient does have accelerated hypertension and vertigo  Moderately large encephalocele and meningocele -Continue outpatient  neurosurgery follow-up  Severe protein calorie malnutrition -Continue feeding supplementation  Anemia secondary to chronic disease -Hemoglobin currently 10.3 -Baseline appears to be around 8 -Continue to monitor CBC  Hypothyroidism -Continue Synthroid  DVT prophylaxis: SCDs  Code Status: Full  Condition: Guarded  Family Communication: Family at bedside. Admission, patients condition and plan of care including tests being ordered have been discussed with the patient and family who indicate understanding and agree with the plan and Code Status.  Disposition Plan: Admitted   Time spent: 70 minutes  Aden Sek D.O. [facility] Pager [phone]  If 7PM-7AM, please contact night-coverage [email or web address] Password [facility] 11/25/2014, 11:46 AM

## 2014-11-25 NOTE — ED Provider Notes (Signed)
CSN: [id number]     Arrival date & time 11/25/14  0746 History   First MD Initiated Contact with Patient 11/25/14 [phone]     Chief Complaint  Patient presents with  . Dizziness     (Consider location/radiation/quality/duration/timing/severity/associated sxs/prior Treatment) HPI Comments: Patient presents to the ER for evaluation of dizziness. Patient describes it as vertigo. She reports a sensation of spinning. She reports that she had a bleed in her brain last month associated with elevated blood pressure. She was at [facility] for here he has, but signed herself out. Since getting home, she has noticed sensation of feeling like she was spinning. She saw her doctor 3 days ago and her blood pressure was elevated. She had increased blood pressure medication given to her, now she is on 3 different medications. She is not experiencing any headache, but has persistent dizziness. She denies chest pain, shortness of breath.  Patient is a 64 y.o. female presenting with dizziness.  Dizziness   Past Medical History  Diagnosis Date  . HTN (hypertension)   . Hypothyroidism   . Uterine cancer 2012  . Anemia   . CKD (chronic kidney disease)   . Dysphagia   . Hyperparathyroidism due to renal insufficiency   . GERD (gastroesophageal reflux disease)   . Diverticulitis    Past Surgical History  Procedure Laterality Date  . Cholecystectomy    . Cesarean section    . Laparoscopic total hysterectomy    . Colonoscopy  2000    [facility], [facility]  . Colonoscopy  2003 NUR BRBPR D50 V6    [facility]/[place] [facility], [facility]  . Abdominal hysterectomy    . Nasal septum surgery    . Esophagogastroduodenoscopy (egd) with esophageal dilation N/A 06/08/2013    Dr. Fields:Stricture at the gastroesophageal junction/MILD NON-erosive gastritis (inflammation) was found in the gastric antrum; multiple biopsies/NO SOURCE FOR ANEMIA IDETIFIED-MOST LIKELY DUE TO ANEMIA OF CHRONIC DISEASE  . Colonoscopy N/A 09/04/2013    Procedure:  COLONOSCOPY;  Surgeon: Danie Binder, MD;  Location: [facility];  Service: Endoscopy;  Laterality: N/A;  10:30AM-moved to 9:25 [name]  . Appendectomy    . Bascilic vein transposition Right 09/17/2013    Procedure: BRACHIAL VEIN TRANSPOSITION;  Surgeon: Conrad Lake Mills, MD;  Location: [place];  Service: Vascular;  Laterality: Right;  . Insertion of dialysis catheter Left 09/17/2013    Procedure: INSERTION OF DIALYSIS CATHETER;  Surgeon: Conrad Stratford, MD;  Location: [place];  Service: Vascular;  Laterality: Left;  . Bascilic vein transposition Right 10/29/2013    Procedure: RIGHT SECOND STAGE BRACHIAL VEIN TRANSPOSITION;  Surgeon: Conrad Calera, MD;  Location: [facility] OR;  Service: Vascular;  Laterality: Right;  . Givens capsule study N/A 12/03/2013    Procedure: GIVENS CAPSULE STUDY;  Surgeon: Danie Binder, MD;  Location: [facility];  Service: Endoscopy;  Laterality: N/A;  7:30   Family History  Problem Relation Age of Onset  . Colon cancer Neg Hx   . Cancer Mother   . Diabetes Mother   . Hypertension Mother   . Diabetes Father   . Hypertension Father   . Hypertension Sister    History  Substance Use Topics  . Smoking status: Never Smoker   . Smokeless tobacco: Never Used  . Alcohol Use: No   OB History    Gravida Para Term Preterm AB TAB SAB Ectopic Multiple Living   1 1 1  Review of Systems  Neurological: Positive for dizziness.  All other systems reviewed and are negative.     Allergies  Cephalexin  Home Medications   Prior to Admission medications   Medication Sig Start Date End Date Taking? Authorizing Provider  allopurinol (ZYLOPRIM) 300 MG tablet Take 300 mg by mouth daily.   Yes Historical Provider, MD  cloNIDine (CATAPRES) 0.2 MG tablet Take 1 tablet by mouth 3 (three) times daily. 11/22/14  Yes Historical Provider, MD  docusate sodium (COLACE) 100 MG capsule Take 100 mg by mouth 2 (two) times daily.   Yes Historical Provider, MD  feeding  supplement, ENSURE ENLIVE, (ENSURE ENLIVE) LIQD Take 237 mLs by mouth daily. 10/23/14  Yes Eugenie Filler, MD  ferrous sulfate dried (SLOW FE) 160 (50 FE) MG TBCR SR tablet Take 640 mg by mouth daily.   Yes Historical Provider, MD  hydrALAZINE (APRESOLINE) 50 MG tablet Take 1 tablet (50 mg total) by mouth every 8 (eight) hours. 10/22/14  Yes Reyne Dumas, MD  labetalol (NORMODYNE) 300 MG tablet Take 1 tablet (300 mg total) by mouth 3 (three) times daily. 08/20/13  Yes Kinnie Feil, MD  levothyroxine (SYNTHROID, LEVOTHROID) 50 MCG tablet Take 1 tablet (50 mcg total) by mouth daily. 08/20/13  Yes Kinnie Feil, MD  lidocaine-prilocaine (EMLA) cream Apply 1 application topically as needed (on Dialysis days Tues,Thurs.,Sat.).   Yes Historical Provider, MD  Linaclotide (LINZESS) 290 MCG CAPS capsule 1 PO 30 MINS PRIOR TO BREAKFAST. IF O SATISFACTORY BM AFTER 2 WEEKS, TAKE WITH BREAKFAST. Patient taking differently: Take 290 mcg by mouth every morning.  05/01/14  Yes Danie Binder, MD  meclizine (ANTIVERT) 25 MG tablet Take 1 tablet by mouth 4 (four) times daily as needed. dizziness 11/22/14  Yes Historical Provider, MD  Nutritional Supplements (FEEDING SUPPLEMENT, NEPRO CARB STEADY,) LIQD Take 237 mLs by mouth as needed (missed meal during dialysis.). 10/22/14  Yes Reyne Dumas, MD  omeprazole (PRILOSEC) 20 MG capsule Take 1 capsule (20 mg total) by mouth daily. 07/29/14  Yes Orvil Feil, NP  sevelamer carbonate (RENVELA) 800 MG tablet Take 800-2,400 mg by mouth 3 (three) times daily with meals. Patient takes 1 tablet with snack and 3 tablets with meal   Yes Historical Provider, MD  sodium bicarbonate 650 MG tablet Take 1 tablet (650 mg total) by mouth 2 (two) times daily. 06/12/13  Yes Kathie Dike, MD  feeding supplement, RESOURCE BREEZE, (RESOURCE BREEZE) LIQD Take 1 Container by mouth daily. Patient not taking: Reported on 11/25/2014 10/23/14   Eugenie Filler, MD   BP 199/85 mmHg  Pulse 78   Temp(Src) 98.3 F (36.8 C) (Oral)  Resp 22  Ht 5\' 6"  (1.676 m)  Wt 146 lb (66.225 kg)  BMI 23.58 kg/m2  SpO2 100% Physical Exam  Constitutional: She is oriented to person, place, and time. She appears well-developed and well-nourished. No distress.  HENT:  Head: Normocephalic and atraumatic.  Right Ear: Hearing normal.  Left Ear: Hearing normal.  Nose: Nose normal.  Mouth/Throat: Oropharynx is clear and moist and mucous membranes are normal.  Eyes: Conjunctivae and EOM are normal. Pupils are equal, round, and reactive to light.  Neck: Normal range of motion. Neck supple.  Cardiovascular: Regular rhythm, S1 normal and S2 normal.  Exam reveals no gallop and no friction rub.   No murmur heard. Pulmonary/Chest: Effort normal and breath sounds normal. No respiratory distress. She exhibits no tenderness.  Abdominal: Soft. Normal appearance and bowel  sounds are normal. There is no hepatosplenomegaly. There is no tenderness. There is no rebound, no guarding, no tenderness at McBurney's point and negative Murphy's sign. No hernia.  Musculoskeletal: Normal range of motion.  Neurological: She is alert and oriented to person, place, and time. She has normal strength. No cranial nerve deficit or sensory deficit. Coordination normal. GCS eye subscore is 4. GCS verbal subscore is 5. GCS motor subscore is 6.  Extraocular muscle movement: normal No visual field cut Pupils: equal and reactive both direct and consensual response is normal No nystagmus present    Sensory function is intact to light touch, pinprick Proprioception intact  Grip strength 5/5 symmetric in upper extremities No pronator drift Normal finger to nose bilaterally  Lower extremity strength 5/5 against gravity Normal heel to shin bilaterally     Skin: Skin is warm, dry and intact. No rash noted. No cyanosis.  Psychiatric: She has a normal mood and affect. Her speech is normal and behavior is normal. Thought content normal.   Nursing note and vitals reviewed.   ED Course  Procedures (including critical care time) Labs Review Labs Reviewed  CBC WITH DIFFERENTIAL/PLATELET - Abnormal; Notable for the following:    Hemoglobin 10.3 (*)    HCT 33.5 (*)    MCV 77.0 (*)    MCH 23.7 (*)    RDW 21.2 (*)    All other components within normal limits  BASIC METABOLIC PANEL - Abnormal; Notable for the following:    BUN 38 (*)    Creatinine, Ser 6.69 (*)    GFR calc non Af Amer 6 (*)    GFR calc Af Amer 7 (*)    All other components within normal limits  URINALYSIS, ROUTINE W REFLEX MICROSCOPIC - Abnormal; Notable for the following:    Glucose, UA 100 (*)    Hgb urine dipstick SMALL (*)    Protein, ur 100 (*)    Leukocytes, UA SMALL (*)    All other components within normal limits  TROPONIN I  URINE MICROSCOPIC-ADD ON    Imaging Review Ct Head Wo Contrast  11/25/2014   CLINICAL DATA:  Dizziness. Onset of symptoms this morning. Initial encounter.  EXAM: CT HEAD WITHOUT CONTRAST  TECHNIQUE: Contiguous axial images were obtained from the base of the skull through the vertex without intravenous contrast.  COMPARISON:  MRI brain 10/19/2014.  FINDINGS: No mass lesion, mass effect, midline shift, hydrocephalus, hemorrhage. No acute territorial cortical ischemia/infarct. Atrophy and chronic ischemic white matter disease is present. Encephalomalacia is present in the LEFT basal ganglia compatible with recent infarct. This is difficult to distinguish from adjacent chronic ischemic white matter disease. Chronic bilateral thalamic lacunar infarcts. LEFT Middle cranial fossa encephalocele without interval change.  IMPRESSION: Atrophy and chronic ischemic white matter disease without acute intracranial abnormality.   Electronically Signed   By: Dereck Ligas M.D.   On: 11/25/2014 09:18     EKG Interpretation   Date/Time:  Monday November 25 2014 07:57:33 EDT Ventricular Rate:  76 PR Interval:  169 QRS Duration: 80 QT  Interval:  424 QTC Calculation: 477 R Axis:   61 Text Interpretation:  Sinus rhythm LAE, consider biatrial enlargement  Probable left ventricular hypertrophy Baseline wander in lead(s) III No  significant change since last tracing Confirmed by Limuel Nieblas  MD,  Wilhelm Ganaway [phone]) on 11/25/2014 8:05:53 AM      MDM   Final diagnoses:  Dizziness  Uncontrolled hypertension   Patient presents to the ER for evaluation  of dizziness. Patient reports that she has been experiencing increasing dizziness for approximately one week. She saw her primary care doctor 3 days ago and her blood pressure was elevated, high blood pressure medication added. She continues to have symptoms. Reviewing her records reveals that she was recently hospitalized for hypertensive urgency with basal ganglial bleed. She does not have focal findings on examination. CT head does not show any recurrent bleed or abnormality. Patient was markedly hypertensive at arrival, 230/97. She was given multiple IV doses of hydralazine and blood pressure is improving but still elevated. Based on her recent history of what appears to be hypertensive bleed and uncontrolled hypertension, we will ask hospitalist to admit her for blood pressure control.    Orpah Greek, MD 11/25/14 1044

## 2014-11-26 ENCOUNTER — Encounter (HOSPITAL_COMMUNITY): Payer: Self-pay

## 2014-11-26 ENCOUNTER — Emergency Department (HOSPITAL_COMMUNITY)
Admission: EM | Admit: 2014-11-26 | Discharge: 2014-11-26 | Discharge status: Against Medical Advice | Payer: Medicare Other | Attending: Emergency Medicine | Admitting: Emergency Medicine

## 2014-11-26 DIAGNOSIS — Z8669 Personal history of other diseases of the nervous system and sense organs: Secondary | ICD-10-CM | POA: Insufficient documentation

## 2014-11-26 DIAGNOSIS — Z79899 Other long term (current) drug therapy: Secondary | ICD-10-CM | POA: Insufficient documentation

## 2014-11-26 DIAGNOSIS — N189 Chronic kidney disease, unspecified: Secondary | ICD-10-CM | POA: Insufficient documentation

## 2014-11-26 DIAGNOSIS — D649 Anemia, unspecified: Secondary | ICD-10-CM | POA: Insufficient documentation

## 2014-11-26 DIAGNOSIS — I129 Hypertensive chronic kidney disease with stage 1 through stage 4 chronic kidney disease, or unspecified chronic kidney disease: Secondary | ICD-10-CM | POA: Insufficient documentation

## 2014-11-26 DIAGNOSIS — Z8542 Personal history of malignant neoplasm of other parts of uterus: Secondary | ICD-10-CM | POA: Insufficient documentation

## 2014-11-26 DIAGNOSIS — K219 Gastro-esophageal reflux disease without esophagitis: Secondary | ICD-10-CM | POA: Insufficient documentation

## 2014-11-26 DIAGNOSIS — R42 Dizziness and giddiness: Secondary | ICD-10-CM | POA: Insufficient documentation

## 2014-11-26 DIAGNOSIS — E039 Hypothyroidism, unspecified: Secondary | ICD-10-CM | POA: Insufficient documentation

## 2014-11-26 HISTORY — DX: Diplopia: H53.2

## 2014-11-26 LAB — BASIC METABOLIC PANEL
Anion gap: 11 (ref 5–15)
BUN: 49 mg/dL — ABNORMAL HIGH (ref 6–23)
CALCIUM: 9.3 mg/dL (ref 8.4–10.5)
CO2: 27 mmol/L (ref 19–32)
CREATININE: 8.08 mg/dL — AB (ref 0.50–1.10)
Chloride: 98 mmol/L (ref 96–112)
GFR calc Af Amer: 5 mL/min — ABNORMAL LOW (ref >90)
GFR calc non Af Amer: 5 mL/min — ABNORMAL LOW (ref >90)
GLUCOSE: 96 mg/dL (ref 70–99)
Potassium: 4.4 mmol/L (ref 3.5–5.1)
Sodium: 136 mmol/L (ref 135–145)

## 2014-11-26 LAB — CBC
HCT: 28.2 % — ABNORMAL LOW (ref 36.0–46.0)
Hemoglobin: 8.7 g/dL — ABNORMAL LOW (ref 12.0–15.0)
MCH: 23.4 pg — ABNORMAL LOW (ref 26.0–34.0)
MCHC: 30.9 g/dL (ref 30.0–36.0)
MCV: 75.8 fL — AB (ref 78.0–100.0)
Platelets: 207 10*3/uL (ref 150–400)
RBC: 3.72 MIL/uL — ABNORMAL LOW (ref 3.87–5.11)
RDW: 20.7 % — AB (ref 11.5–15.5)
WBC: 6 10*3/uL (ref 4.0–10.5)

## 2014-11-26 MED ORDER — HEPARIN SODIUM (PORCINE) 1000 UNIT/ML DIALYSIS
1000.0000 [IU] | INTRAMUSCULAR | Status: DC | PRN
  Filled 2014-11-26: qty 1

## 2014-11-26 MED ORDER — ALTEPLASE 2 MG IJ SOLR
2.0000 mg | Freq: Once | INTRAMUSCULAR | Status: DC | PRN
  Filled 2014-11-26: qty 2

## 2014-11-26 MED ORDER — PENTAFLUOROPROP-TETRAFLUOROETH EX AERO
INHALATION_SPRAY | CUTANEOUS | Status: AC
  Filled 2014-11-26: qty 103.5

## 2014-11-26 MED ORDER — SODIUM CHLORIDE 0.9 % IV SOLN: 100.0000 mL | INTRAVENOUS | Status: DC | PRN

## 2014-11-26 MED ORDER — MENTHOL 3 MG MT LOZG
1.0000 | LOZENGE | OROMUCOSAL | Status: DC | PRN
  Filled 2014-11-26: qty 9

## 2014-11-26 MED ORDER — MENTHOL 3 MG MT LOZG: 1.0000 | LOZENGE | OROMUCOSAL | Status: DC | PRN

## 2014-11-26 MED ORDER — PENTAFLUOROPROP-TETRAFLUOROETH EX AERO
1.0000 | INHALATION_SPRAY | CUTANEOUS | Status: DC | PRN
Start: 2014-11-26 — End: 2014-11-26
  Filled 2014-11-26: qty 30

## 2014-11-26 MED ORDER — LIDOCAINE HCL (PF) 1 % IJ SOLN: 5.0000 mL | INTRAMUSCULAR | Status: DC | PRN

## 2014-11-26 NOTE — ED Notes (Signed)
Patient states headache starting at 1900. C/o nausea. Patient states she has a history of vertigo and patient states "i know its vertigo"

## 2014-11-26 NOTE — Progress Notes (Signed)
Pt discharged home. Discharge instructions given to pt and medications and follow up appointments explained. Understanding verbalized. IV removed with no complications. Pt taken out via [place] by NT with belongings.

## 2014-11-26 NOTE — Progress Notes (Signed)
Pt is continually using call bell to call staff to room and complaining about the room temp, the bed, the monitors etc Pt was explained the necessity for these devices and changes were made, multiple times, to the various complaints to no avail.

## 2014-11-26 NOTE — Progress Notes (Signed)
Brought and discussed advance directives packet. She stated she wasn't ready to complete the forms and wanted to talk with her husband. Will check with her again as to if she does want to complete the forms.

## 2014-11-26 NOTE — Consult Note (Signed)
Reason for Consult: End-stage renal disease Referring Physician: Dr.Mikhail  Sonya [name] is an 64 y.o. female.  HPI: She is a patient who has history of hypertension, CNS bleeding, end-stage renal disease on maintenance hemodialysis presently came complaints of dizziness and lightheadedness. When patient was evaluated in the emergency room her blood pressure was found to be very high and uncontrolled. Presently patient is feeling much better. Patient was recently was sent to nursing home for physical therapy from [facility]. When she was in nursing home her blood pressure was very well controlled. Hence at this moment patient may be noncompliant with her medication as her blood pressure is very high after she signed [place] and went home last week.   Past Medical History  Diagnosis Date  . HTN (hypertension)   . Hypothyroidism   . Uterine cancer 2012  . Anemia   . CKD (chronic kidney disease)   . Dysphagia   . Hyperparathyroidism due to renal insufficiency   . GERD (gastroesophageal reflux disease)   . Diverticulitis     Past Surgical History  Procedure Laterality Date  . Cholecystectomy    . Cesarean section    . Laparoscopic total hysterectomy    . Colonoscopy  2000    TICS, IH  . Colonoscopy  2003 NUR BRBPR D50 V6    DC/[place] TICS, IH  . Abdominal hysterectomy    . Nasal septum surgery    . Esophagogastroduodenoscopy (egd) with esophageal dilation N/A 06/08/2013    Dr. Fields:Stricture at the gastroesophageal junction/MILD NON-erosive gastritis (inflammation) was found in the gastric antrum; multiple biopsies/NO SOURCE FOR ANEMIA IDETIFIED-MOST LIKELY DUE TO ANEMIA OF CHRONIC DISEASE  . Colonoscopy N/A 09/04/2013    Procedure: COLONOSCOPY;  Surgeon: Danie Binder, MD;  [facility];  Service: Endoscopy;  Laterality: N/A;  10:30AM-moved to 9:25 [name]  . Appendectomy    . Bascilic vein transposition Right 09/17/2013    Procedure:  BRACHIAL VEIN TRANSPOSITION;  Surgeon: Conrad Washburn, MD;  Location: [place];  Service: Vascular;  Laterality: Right;  . Insertion of dialysis catheter Left 09/17/2013    Procedure: INSERTION OF DIALYSIS CATHETER;  Surgeon: Conrad Toast, MD;  Location: [place];  Service: Vascular;  Laterality: Left;  . Bascilic vein transposition Right 10/29/2013    Procedure: RIGHT SECOND STAGE BRACHIAL VEIN TRANSPOSITION;  Surgeon: Conrad Pilgrim, MD;  Location[facility] OR;  Service: Vascular;  Laterality: Right;  . Givens capsule study N/A 12/03/2013    Procedure: GIVENS CAPSULE STUDY;  Surgeon: Danie Binder, MD;  [facility];  Service: Endoscopy;  Laterality: N/A;  7:30    Family History  Problem Relation Age of Onset  . Colon cancer Neg Hx   . Cancer Mother   . Diabetes Mother   . Hypertension Mother   . Diabetes Father   . Hypertension Father   . Hypertension Sister     Social History:  reports that she has never smoked. She has never used smokeless tobacco. She reports that she does not drink alcohol or use illicit drugs.  Allergies:  Allergies  Allergen Reactions  . Cephalexin Itching    Medications: I have reviewed the patient's current medications.  Results for orders placed or performed during the hospital encounter of 11/25/14 (from the past 48 hour(s))  CBC with Differential/Platelet     Status: Abnormal   Collection Time: 11/25/14  8:23 AM  Result Value Ref Range  WBC 4.6 4.0 - 10.5 K/uL   RBC 4.35 3.87 - 5.11 MIL/uL   Hemoglobin 10.3 (L) 12.0 - 15.0 g/dL   HCT 33.5 (L) 36.0 - 46.0 %   MCV 77.0 (L) 78.0 - 100.0 fL   MCH 23.7 (L) 26.0 - 34.0 pg   MCHC 30.7 30.0 - 36.0 g/dL   RDW 21.2 (H) 11.5 - 15.5 %   Platelets 230 150 - 400 K/uL   Neutrophils Relative % 62 43 - 77 %   Neutro Abs 2.9 1.7 - 7.7 K/uL   Lymphocytes Relative 27 12 - 46 %   Lymphs Abs 1.3 0.7 - 4.0 K/uL   Monocytes Relative 8 3 - 12 %   Monocytes Absolute 0.4 0.1 - 1.0 K/uL   Eosinophils Relative 2 0 - 5 %    Eosinophils Absolute 0.1 0.0 - 0.7 K/uL   Basophils Relative 1 0 - 1 %   Basophils Absolute 0.0 0.0 - 0.1 K/uL  Basic metabolic panel     Status: Abnormal   Collection Time: 11/25/14  8:50 AM  Result Value Ref Range   Sodium 137 135 - 145 mmol/L   Potassium 3.9 3.5 - 5.1 mmol/L   Chloride 100 96 - 112 mmol/L   CO2 25 19 - 32 mmol/L   Glucose, Bld 95 70 - 99 mg/dL   BUN 38 (H) 6 - 23 mg/dL   Creatinine, Ser 6.69 (H) 0.50 - 1.10 mg/dL   Calcium 9.6 8.4 - 10.5 mg/dL   GFR calc non Af Amer 6 (L) >90 mL/min   GFR calc Af Amer 7 (L) >90 mL/min    Comment: (NOTE) The eGFR has been calculated using the CKD EPI equation. This calculation has not been validated in all clinical situations. eGFR's persistently <90 mL/min signify possible Chronic Kidney Disease.    Anion gap 12 5 - 15  Troponin I     Status: None   Collection Time: 11/25/14  8:50 AM  Result Value Ref Range   Troponin I <0.03 <0.031 ng/mL    Comment:        NO INDICATION OF MYOCARDIAL INJURY.   Urinalysis, Routine w reflex microscopic     Status: Abnormal   Collection Time: 11/25/14  9:30 AM  Result Value Ref Range   Color, Urine YELLOW YELLOW   APPearance CLEAR CLEAR   Specific Gravity, Urine 1.020 1.005 - 1.030   pH 7.5 5.0 - 8.0   Glucose, UA 100 (A) NEGATIVE mg/dL   Hgb urine dipstick SMALL (A) NEGATIVE   Bilirubin Urine NEGATIVE NEGATIVE   Ketones, ur NEGATIVE NEGATIVE mg/dL   Protein, ur 100 (A) NEGATIVE mg/dL   Urobilinogen, UA 0.2 0.0 - 1.0 mg/dL   Nitrite NEGATIVE NEGATIVE   Leukocytes, UA SMALL (A) NEGATIVE  Urine microscopic-add on     Status: None   Collection Time: 11/25/14  9:30 AM  Result Value Ref Range   WBC, UA 0-2 <3 WBC/hpf   RBC / HPF 0-2 <3 RBC/hpf  MRSA PCR Screening     Status: None   Collection Time: 11/25/14  2:34 PM  Result Value Ref Range   MRSA by PCR NEGATIVE NEGATIVE    Comment:        The GeneXpert MRSA Assay (FDA approved for NASAL specimens only), is one component of  a comprehensive MRSA colonization surveillance program. It is not intended to diagnose MRSA infection nor to guide or monitor treatment for MRSA infections.   Basic metabolic panel  Status: Abnormal   Collection Time: 11/26/14  4:57 AM  Result Value Ref Range   Sodium 136 135 - 145 mmol/L   Potassium 4.4 3.5 - 5.1 mmol/L   Chloride 98 96 - 112 mmol/L   CO2 27 19 - 32 mmol/L   Glucose, Bld 96 70 - 99 mg/dL   BUN 49 (H) 6 - 23 mg/dL   Creatinine, Ser 8.08 (H) 0.50 - 1.10 mg/dL   Calcium 9.3 8.4 - 10.5 mg/dL   GFR calc non Af Amer 5 (L) >90 mL/min   GFR calc Af Amer 5 (L) >90 mL/min    Comment: (NOTE) The eGFR has been calculated using the CKD EPI equation. This calculation has not been validated in all clinical situations. eGFR's persistently <90 mL/min signify possible Chronic Kidney Disease.    Anion gap 11 5 - 15  CBC     Status: Abnormal   Collection Time: 11/26/14  4:57 AM  Result Value Ref Range   WBC 6.0 4.0 - 10.5 K/uL   RBC 3.72 (L) 3.87 - 5.11 MIL/uL   Hemoglobin 8.7 (L) 12.0 - 15.0 g/dL   HCT 28.2 (L) 36.0 - 46.0 %   MCV 75.8 (L) 78.0 - 100.0 fL   MCH 23.4 (L) 26.0 - 34.0 pg   MCHC 30.9 30.0 - 36.0 g/dL   RDW 20.7 (H) 11.5 - 15.5 %   Platelets 207 150 - 400 K/uL    Ct Head Wo Contrast  11/25/2014   CLINICAL DATA:  Dizziness. Onset of symptoms this morning. Initial encounter.  EXAM: CT HEAD WITHOUT CONTRAST  TECHNIQUE: Contiguous axial images were obtained from the base of the skull through the vertex without intravenous contrast.  COMPARISON:  MRI brain 10/19/2014.  FINDINGS: No mass lesion, mass effect, midline shift, hydrocephalus, hemorrhage. No acute territorial cortical ischemia/infarct. Atrophy and chronic ischemic white matter disease is present. Encephalomalacia is present in the LEFT basal ganglia compatible with recent infarct. This is difficult to distinguish from adjacent chronic ischemic white matter disease. Chronic bilateral thalamic lacunar  infarcts. LEFT Middle cranial fossa encephalocele without interval change.  IMPRESSION: Atrophy and chronic ischemic white matter disease without acute intracranial abnormality.   Electronically Signed   By: Dereck Ligas M.D.   On: 11/25/2014 09:18   Mr Brain Wo Contrast  11/25/2014   CLINICAL DATA:  63 year old hypertensive female with history of chronic kidney disease, hyperparathyroidism and uterine cancer presenting with vertigo for the past week. Recent intracranial hemorrhage. Subsequent encounter.  EXAM: MRI HEAD WITHOUT CONTRAST  TECHNIQUE: Multiplanar, multiecho pulse sequences of the brain and surrounding structures were obtained without intravenous contrast.  COMPARISON:  Several prior exams, most recent head CT 11/25/2014 and most recent brain MR 10/19/2014.  FINDINGS: No acute infarct.  Interval expected evolution/clearing of hemorrhage within the posterior left lenticular nucleus.  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  Otherwise, scattered tiny blood breakdown products supratentorial and infratentorial region most suggestive of episodes of prior hemorrhagic ischemia. Amyloid angiopathy is a secondary less likely consideration.  Prominent small vessel disease type changes.  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 The meningocele measures up to 3.5 cm and protruding brain measures up to 1.2 cm. Appearance without significant change.  Prominent posterior pituitary gland felt to be an incidental finding and unchanged.  Minimal exophthalmos.  Major intracranial vascular structures are patent.  Bilateral mastoid air cell partial opacification greater on the right  without obstructing lesion of drainage of the eustachian tube noted.  No intracranial mass lesion noted separate from above described findings. Contrast not administered.  Hyperostosis frontalis interna incidentally  noted.  IMPRESSION: No acute infarct.  Interval expected evolution/clearing of hemorrhage within the posterior left lenticular nucleus.  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  Otherwise scattered tiny blood breakdown products supratentorial and infratentorial region most suggestive of episodes of prior hemorrhagic ischemia. Amyloid angiopathy is a secondary less likely consideration.  Prominent small vessel disease type changes.  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 The meningocele measures up to 3.5 cm and protruding brain measures up to 1.2 cm. Appearance without significant change.  Bilateral mastoid air cell partial opacification greater on the right   Electronically Signed   By: Genia Del M.D.   On: 11/25/2014 14:35    Review of Systems  Constitutional: Positive for diaphoresis.  Respiratory: Negative for shortness of breath.   Cardiovascular: Negative for orthopnea and leg swelling.  Gastrointestinal: Negative for heartburn and nausea.  Neurological: Negative for dizziness, focal weakness and headaches.   Blood pressure 198/64, pulse 74, temperature 98.4 F (36.9 C), temperature source Oral, resp. rate 17, height _0  (1.676 m), weight 66.225 kg (146 lb), SpO2 100 %. Physical Exam  Constitutional: No distress.  Eyes: No scleral icterus.  Neck: No JVD present.  Cardiovascular: Normal rate and regular rhythm.   No murmur heard. Respiratory: No respiratory distress. She has no wheezes.  GI: Bowel sounds are normal. She exhibits no distension. There is no tenderness.  Musculoskeletal: She exhibits no edema.    Assessment Problem #1 hypertension: Her blood pressure remains high. This morning she is asymptomatic. Problem #2 end-stage renal disease: She is status post hemodialysis on Saturday and patient is due for dialysis today. Her potassium is  good. Problem #3 history of intracerebral bleeding. Presently CT scan showed no new sign of bleeding. Problem #4 anemia: Her hemoglobin has declined. Is below our target goal. Problem # metabolism or disease: Her calcium is in range. Problem #6 fluid management: Presently the patient doesn't have any sign of fluid overload. Plan: We will make arrangements for patient to get dialysis today Agree putting her back on her antihypertensive medications. We'll use Epogen 10,000 units IV after each dialysis. We'll follow her basic metabolic panel and CBC in the morning.   Oyinkansola Truax S 11/26/2014, 9:33 AM

## 2014-11-26 NOTE — ED Provider Notes (Signed)
CSN: [id number]     Arrival date & time 11/26/14  1956 History  This chart was scribed for Sonya Schmidt, MD by Randa Evens, ED Scribe. This patient was seen in room [facility] and the patient's care was started at 11:15 PM.     Chief Complaint  Patient presents with  . Headache    Patient is a 64 y.o. female presenting with headaches. The history is provided by the patient. No language interpreter was used.  Headache Associated symptoms: dizziness    HPI Comments: Sonya [name] is a 64 y.o. female who presents to the Emergency Department complaining of ongoing "dizziness".  She reports that time she feels "swimmy headed".  It's reported this has affected her ability to ambulate.  She denies unilateral arm or leg weakness.  She was discharged from the hospital several hours ago after being admitted for the same symptoms.  In the hospital she underwent MRI which demonstrated no significant abnormalities to explain the patient's symptoms.  She states his been intermittent issue that she has developed over the past several years.  She reports mild headache.Pt is requesting to be re-admitted in the hospital until all of her symptoms have resolved.  No fevers or chills.  No neck pain or stiffness.   Past Medical History  Diagnosis Date  . HTN (hypertension)   . Hypothyroidism   . Uterine cancer 2012  . Anemia   . CKD (chronic kidney disease)   . Dysphagia   . Hyperparathyroidism due to renal insufficiency   . GERD (gastroesophageal reflux disease)   . Diverticulitis   . Vertical diplopia    Past Surgical History  Procedure Laterality Date  . Cholecystectomy    . Cesarean section    . Laparoscopic total hysterectomy    . Colonoscopy  2000    TICS, IH  . Colonoscopy  2003 NUR BRBPR D50 V6    DC/[place] TICS, IH  . Abdominal hysterectomy    . Nasal septum surgery    . Esophagogastroduodenoscopy (egd) with esophageal dilation N/A 06/08/2013    Dr. Fields:Stricture at the  gastroesophageal junction/MILD NON-erosive gastritis (inflammation) was found in the gastric antrum; multiple biopsies/NO SOURCE FOR ANEMIA IDETIFIED-MOST LIKELY DUE TO ANEMIA OF CHRONIC DISEASE  . Colonoscopy N/A 09/04/2013    Procedure: COLONOSCOPY;  Surgeon: Danie Binder, MD;  [facility];  Service: Endoscopy;  Laterality: N/A;  10:30AM-moved to 9:25 [name]  . Appendectomy    . Bascilic vein transposition Right 09/17/2013    Procedure: BRACHIAL VEIN TRANSPOSITION;  Surgeon: Conrad [place], MD;  Location: [place];  Service: Vascular;  Laterality: Right;  . Insertion of dialysis catheter Left 09/17/2013    Procedure: INSERTION OF DIALYSIS CATHETER;  Surgeon: Conrad [place], MD;  Location: [place];  Service: Vascular;  Laterality: Left;  . Bascilic vein transposition Right 10/29/2013    Procedure: RIGHT SECOND STAGE BRACHIAL VEIN TRANSPOSITION;  Surgeon: Conrad Dickens, MD;  Location: [facility] OR;  Service: Vascular;  Laterality: Right;  . Givens capsule study N/A 12/03/2013    Procedure: GIVENS CAPSULE STUDY;  Surgeon: Danie Binder, MD;  [facility];  Service: Endoscopy;  Laterality: N/A;  7:30   Family History  Problem Relation Age of Onset  . Colon cancer Neg Hx   . Cancer Mother   . Diabetes Mother   . Hypertension Mother   . Diabetes Father   . Hypertension Father   . Hypertension Sister  History  Substance Use Topics  . Smoking status: Never Smoker   . Smokeless tobacco: Never Used  . Alcohol Use: No   OB History    Gravida Para Term Preterm AB TAB SAB Ectopic Multiple Living   1 1 1             Review of Systems  Neurological: Positive for dizziness and headaches.  A complete 10 system review of systems was obtained and all systems are negative except as noted in the HPI and PMH.     Allergies  Cephalexin  Home Medications   Prior to Admission medications   Medication Sig Start Date End Date Taking? Authorizing Provider  allopurinol  (ZYLOPRIM) 300 MG tablet Take 300 mg by mouth daily.   Yes Historical Provider, MD  cloNIDine (CATAPRES) 0.2 MG tablet Take 1 tablet by mouth 3 (three) times daily. 11/22/14  Yes Historical Provider, MD  docusate sodium (COLACE) 100 MG capsule Take 100 mg by mouth 2 (two) times daily.   Yes Historical Provider, MD  ferrous sulfate dried (SLOW FE) 160 (50 FE) MG TBCR SR tablet Take 640 mg by mouth daily.   Yes Historical Provider, MD  hydrALAZINE (APRESOLINE) 50 MG tablet Take 1 tablet (50 mg total) by mouth every 8 (eight) hours. 10/22/14  Yes Reyne Dumas, MD  labetalol (NORMODYNE) 300 MG tablet Take 1 tablet (300 mg total) by mouth 3 (three) times daily. 08/20/13  Yes Kinnie Feil, MD  levothyroxine (SYNTHROID, LEVOTHROID) 50 MCG tablet Take 1 tablet (50 mcg total) by mouth daily. 08/20/13  Yes Kinnie Feil, MD  lidocaine-prilocaine (EMLA) cream Apply 1 application topically as needed (on Dialysis days Tues,Thurs.,Sat.).   Yes Historical Provider, MD  Linaclotide (LINZESS) 290 MCG CAPS capsule 1 PO 30 MINS PRIOR TO BREAKFAST. IF O SATISFACTORY BM AFTER 2 WEEKS, TAKE WITH BREAKFAST. Patient taking differently: Take 290 mcg by mouth every morning.  05/01/14  Yes Danie Binder, MD  meclizine (ANTIVERT) 25 MG tablet Take 1 tablet by mouth 4 (four) times daily as needed. dizziness 11/22/14  Yes Historical Provider, MD  omeprazole (PRILOSEC) 20 MG capsule Take 1 capsule (20 mg total) by mouth daily. 07/29/14  Yes Orvil Feil, NP  sevelamer carbonate (RENVELA) 800 MG tablet Take 800-2,400 mg by mouth 3 (three) times daily with meals. Patient takes 1 tablet with snack and 3 tablets with meal   Yes Historical Provider, MD  sodium bicarbonate 650 MG tablet Take 1 tablet (650 mg total) by mouth 2 (two) times daily. 06/12/13  Yes Kathie Dike, MD  feeding supplement, ENSURE ENLIVE, (ENSURE ENLIVE) LIQD Take 237 mLs by mouth daily. Patient not taking: Reported on 11/26/2014 10/23/14   Eugenie Filler, MD   Nutritional Supplements (FEEDING SUPPLEMENT, NEPRO CARB STEADY,) LIQD Take 237 mLs by mouth as needed (missed meal during dialysis.). Patient not taking: Reported on 11/26/2014 10/22/14   Reyne Dumas, MD   BP 147/60 mmHg  Pulse 69  Temp(Src) 99.3 F (37.4 C) (Oral)  Resp 18  Ht 5' (1.524 m)  Wt 146 lb (66.225 kg)  BMI 28.51 kg/m2  SpO2 100%   Physical Exam  Constitutional: She is oriented to person, place, and time. She appears well-developed and well-nourished.  HENT:  Head: Normocephalic and atraumatic.  Eyes: EOM are normal. Pupils are equal, round, and reactive to light.  Neck: Normal range of motion.  Cardiovascular: Regular rhythm.   Pulmonary/Chest: Effort normal.  Abdominal: Soft. She exhibits no distension.  Musculoskeletal: Normal range of motion.  Neurological: She is alert and oriented to person, place, and time.  5/5 strength in major muscle groups of  bilateral upper and lower extremities. Speech normal. No facial asymetry.   Psychiatric: She has a normal mood and affect.  Nursing note and vitals reviewed.   ED Course  Procedures (including critical care time) DIAGNOSTIC STUDIES: Oxygen Saturation is 100% on RA, normal by my interpretation.    COORDINATION OF CARE: 11:42 PM-Discussed treatment plan with pt at bedside and pt agreed to plan.     Labs Review Labs Reviewed - No data to display  Imaging Review Mr Brain Wo Contrast 11/25/2014   CLINICAL DATA:  64 year old hypertensive female with history of chronic kidney disease, hyperparathyroidism and uterine cancer presenting with vertigo for the past week. Recent intracranial hemorrhage. Subsequent encounter.  EXAM: MRI HEAD WITHOUT CONTRAST  TECHNIQUE: Multiplanar, multiecho pulse sequences of the brain and surrounding structures were obtained without intravenous contrast.  COMPARISON:  Several prior exams, most recent head CT 11/25/2014 and most recent brain MR 10/19/2014.  FINDINGS: No acute infarct.   Interval expected evolution/clearing of hemorrhage within the posterior left lenticular nucleus.  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  Otherwise, scattered tiny blood breakdown products supratentorial and infratentorial region most suggestive of episodes of prior hemorrhagic ischemia. Amyloid angiopathy is a secondary less likely consideration.  Prominent small vessel disease type changes.  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 The meningocele measures up to 3.5 cm and protruding brain measures up to 1.2 cm. Appearance without significant change.  Prominent posterior pituitary gland felt to be an incidental finding and unchanged.  Minimal exophthalmos.  Major intracranial vascular structures are patent.  Bilateral mastoid air cell partial opacification greater on the right without obstructing lesion of drainage of the eustachian tube noted.  No intracranial mass lesion noted separate from above described findings. Contrast not administered.  Hyperostosis frontalis interna incidentally noted.  IMPRESSION: No acute infarct.  Interval expected evolution/clearing of hemorrhage within the posterior left lenticular nucleus.  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  Otherwise scattered tiny blood breakdown products supratentorial and infratentorial region most suggestive of episodes of prior hemorrhagic ischemia. Amyloid angiopathy is a secondary less likely consideration.  Prominent small vessel disease type changes.  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 The meningocele measures up to 3.5 cm and protruding brain measures up to 1.2 cm. Appearance without significant change.  Bilateral mastoid air cell partial opacification greater on the right   Electronically Signed    By: Genia Del M.D.   On: 11/25/2014 14:35     EKG Interpretation None      MDM   Final diagnoses:  None   The patient became extremely frustrated during my history.  She wanted me just to make that phone call and readmit her.  I felt as though a thorough history was necessary to exclude alternative causes as she was continued to feel poorly despite recent hospitalization and workup.  I attempted on several occasions to try and redirect her concerns however she became adamant that she wanted to leave.  She quickly stood up from the chair and dressed herself by difficulty.  She appears to be ambulating without much difficulty and her coordination.  She was able to take her own gown off and place her shirt on  and walked out the door with her family.  Even while she was walking attempted to reassure her that she should be stable to follow-up with her primary care physician but she would no longer respond to me.  I feel comfortable however that a medical screening examination was completed and no life-threatening emergency is present.    I personally performed the services described in this documentation, which was scribed in my presence. The recorded information has been reviewed and is accurate.        Sonya Schmidt, MD 11/27/14 [phone]

## 2014-11-26 NOTE — Discharge Summary (Signed)
Physician Discharge Summary  Sonya [name] [id number] DOB: 08/06/1950 DOA: 11/25/2014  PCP: [name], MD  Admit date: 11/25/2014 Discharge date: 11/26/2014  Time spent: 45 minutes  Recommendations for Outpatient Follow-up:  Patient will be discharged to home.  Patient will need to follow up with primary care provider within one week of discharge.  Patient should continue medications AS PRESCRIBED. Patient should follow a renal diet with 1251ml fluid restiction per day.  Continue dialysis as scheduled.  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 female with a history of hypertension, hypothyroidism, recent intracerebral bleed, end-stage renal disease who presented to the emergency department with complaints of dizziness and vertigo. Patient states it started approximately one week ago when she felt that the room was spinning. She also admits that she's had a history of this and has not been getting her meclizine. Patient was recently admitted to [facility] rehabilitation when her dizziness started. She stated she was not receiving her "intrauterine medication" at the rehabilitation facility. Patient decided to check herself out. She then presented to her primary care physician's office, on Friday. At that time she was found to have uncontrolled blood pressure and was started on another medication. Patient denies any headache or changes in vision. She denies any chest pain or shortness of breath, abdominal pain, recent illness or travel. In the emergency  department, patient's blood pressure was initially found to be 238/97. Patient was given 2 doses of hydralazine 10 mg IV. Her blood pressure has improved to 187/77. CT of the head was also conducted showing no acute intracranial abnormality. [facility] asked to admit.  Hospital Course:  Accelerated Hypertension -Upon arrival to the ED, BP 238/97; currently 168/61 -Continue home meds: Clonidine, hydralazine, labetolol  -Patient has had multiple admissions for uncontrolled hypertension -Suspect a component of noncompliance with medications  Vertigo -Patient states that she has had vertigo approximately one week however has not been receiving meclizine. -Feels her dizziness has improved and is "able to see the TV better" -CT head: No acute intracranial abnormality  -MRI brain: no acute infarct, interval clearing of hemorrhage, no change in meningocele/encephalocele -PT consulted, recommended no further PT follow up  ESRD, on hemodialysis -Nephrology consulted for dialysis, patient dialyzes on Tuesday, Thursday, Saturday -Patient dialyzed today  Recent intracranial bleed -CT of the head: No acute intracranial abnormality -In March 2016, neurosurgery did not feel patient needed surgical intervention at that time -MRI shows improvement  Moderately large encephalocele and meningocele -Continue outpatient neurosurgery follow-up -Stable on MRI  Severe protein calorie malnutrition -Continue feeding supplementation  Anemia secondary to chronic disease -Hemoglobin currently  8.7 -Baseline appears to be around 8  Hypothyroidism -Continue Synthroid  Procedures: None  Consultations: Nephrology  Discharge Exam: Filed Vitals:   11/26/14 0542  BP: 168/61  Pulse:   Temp:   Resp:      General: Well developed, well nourished, no distress  HEENT: NCAT, mucous membranes moist.  Cardiovascular: RRR, normal S1/S2  Respiratory: Clear to auscultation  Abdomen: Soft, nontender,  nondistended, + bowel sounds  Extremities: warm dry without cyanosis clubbing or edema  Neuro: AAOx3,  nonfocal  Psych: Appropriate  Discharge Instructions     Medication List    ASK your doctor about these medications        allopurinol 300 MG tablet  Commonly known as:  ZYLOPRIM  Take 300 mg by mouth daily.     cloNIDine 0.2 MG tablet  Commonly known as:  CATAPRES  Take 1 tablet by mouth 3 (three) times daily.     docusate sodium 100 MG capsule  Commonly known as:  COLACE  Take 100 mg by mouth 2 (two) times daily.     feeding supplement (NEPRO CARB STEADY) Liqd  Take 237 mLs by mouth as needed (missed meal during dialysis.).     feeding supplement (ENSURE ENLIVE) Liqd  Take 237 mLs by mouth daily.     feeding supplement (RESOURCE BREEZE) Liqd  Take 1 Container by mouth daily.     ferrous sulfate dried 160 (50 FE) MG Tbcr SR tablet  Commonly known as:  SLOW FE  Take 640 mg by mouth daily.     hydrALAZINE 50 MG tablet  Commonly known as:  APRESOLINE  Take 1 tablet (50 mg total) by mouth every 8 (eight) hours.     labetalol 300 MG tablet  Commonly known as:  NORMODYNE  Take 1 tablet (300 mg total) by mouth 3 (three) times daily.     levothyroxine 50 MCG tablet  Commonly known as:  SYNTHROID, LEVOTHROID  Take 1 tablet (50 mcg total) by mouth daily.     lidocaine-prilocaine cream  Commonly known as:  EMLA  Apply 1 application topically as needed (on Dialysis days Tues,Thurs.,Sat.).     Linaclotide 290 MCG Caps capsule  Commonly known as:  LINZESS  1 PO 30 MINS PRIOR TO BREAKFAST. IF O SATISFACTORY BM AFTER 2 WEEKS, TAKE WITH BREAKFAST.     meclizine 25 MG tablet  Commonly known as:  ANTIVERT  Take 1 tablet by mouth 4 (four) times daily as needed. dizziness     omeprazole 20 MG capsule  Commonly known as:  PRILOSEC  Take 1 capsule (20 mg total) by mouth daily.     sevelamer carbonate 800 MG tablet  Commonly known as:  RENVELA  Take 800-2,400 mg by  mouth 3 (three) times daily with meals. Patient takes 1 tablet with snack and 3 tablets with meal     sodium bicarbonate 650 MG tablet  Take 1 tablet (650 mg total) by mouth 2 (two) times daily.       Allergies  Allergen Reactions  . Cephalexin Itching      The results of significant diagnostics from this hospitalization (including imaging, microbiology, ancillary and laboratory) are listed below for reference.    Significant Diagnostic Studies: Ct Head Wo Contrast  11/25/2014   CLINICAL DATA:  Dizziness. Onset of symptoms this morning. Initial encounter.  EXAM: CT HEAD WITHOUT CONTRAST  TECHNIQUE: Contiguous axial images were obtained from the base of the skull through the vertex without intravenous contrast.  COMPARISON:  MRI brain 10/19/2014.  FINDINGS: No mass lesion, mass effect, midline shift, hydrocephalus, hemorrhage. No acute territorial cortical ischemia/infarct. Atrophy and chronic ischemic white matter disease is present. Encephalomalacia is present in the LEFT basal ganglia compatible with recent infarct. This is difficult to distinguish from adjacent chronic ischemic white matter disease. Chronic bilateral thalamic lacunar infarcts. LEFT Middle cranial fossa encephalocele without interval change.  IMPRESSION: Atrophy and chronic ischemic white matter disease without acute intracranial abnormality.   Electronically Signed   By:  Dereck Ligas M.D.   On: 11/25/2014 09:18   Mr Brain Wo Contrast  11/25/2014   CLINICAL DATA:  64 year old hypertensive female with history of chronic kidney disease, hyperparathyroidism and uterine cancer presenting with vertigo for the past week. Recent intracranial hemorrhage. Subsequent encounter.  EXAM: MRI HEAD WITHOUT CONTRAST  TECHNIQUE: Multiplanar, multiecho pulse sequences of the brain and surrounding structures were obtained without intravenous contrast.  COMPARISON:  Several prior exams, most recent head CT 11/25/2014 and most recent brain MR  10/19/2014.  FINDINGS: No acute infarct.  Interval expected evolution/clearing of hemorrhage within the posterior left lenticular nucleus.  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  Otherwise, scattered tiny blood breakdown products supratentorial and infratentorial region most suggestive of episodes of prior hemorrhagic ischemia. Amyloid angiopathy is a secondary less likely consideration.  Prominent small vessel disease type changes.  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 The meningocele measures up to 3.5 cm and protruding brain measures up to 1.2 cm. Appearance without significant change.  Prominent posterior pituitary gland felt to be an incidental finding and unchanged.  Minimal exophthalmos.  Major intracranial vascular structures are patent.  Bilateral mastoid air cell partial opacification greater on the right without obstructing lesion of drainage of the eustachian tube noted.  No intracranial mass lesion noted separate from above described findings. Contrast not administered.  Hyperostosis frontalis interna incidentally noted.  IMPRESSION: No acute infarct.  Interval expected evolution/clearing of hemorrhage within the posterior left lenticular nucleus.  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  Otherwise scattered tiny blood breakdown products supratentorial and infratentorial region most suggestive of episodes of prior hemorrhagic ischemia. Amyloid angiopathy is a secondary less likely consideration.  Prominent small vessel disease type changes.  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 The meningocele measures up to 3.5 cm and protruding brain measures up to 1.2 cm. Appearance without significant change.  Bilateral mastoid air cell partial opacification  greater on the right   Electronically Signed   By: Genia Del M.D.   On: 11/25/2014 14:35    Microbiology: Recent Results (from the past 240 hour(s))  MRSA PCR Screening     Status: None   Collection Time: 11/25/14  2:34 PM  Result Value Ref Range Status   MRSA by PCR NEGATIVE NEGATIVE Final    Comment:        The GeneXpert MRSA Assay (FDA approved for NASAL specimens only), is one component of a comprehensive MRSA colonization surveillance program. It is not intended to diagnose MRSA infection nor to guide or monitor treatment for MRSA infections.      Labs: Basic Metabolic Panel:  Recent Labs Lab 11/25/14 0850 11/26/14 0457  NA 137 136  K 3.9 4.4  CL 100 98  CO2 25 27  GLUCOSE 95 96  BUN 38* 49*  CREATININE 6.69* 8.08*  CALCIUM 9.6 9.3   Liver Function Tests: No results for input(s): AST, ALT, ALKPHOS, BILITOT, PROT, ALBUMIN in the last 168 hours. No results for input(s): LIPASE, AMYLASE in the last 168 hours. No results for input(s): AMMONIA in the last 168 hours. CBC:  Recent Labs Lab 11/25/14 0823 11/26/14 0457  WBC 4.6 6.0  NEUTROABS 2.9  --   HGB 10.3* 8.7*  HCT 33.5* 28.2*  MCV 77.0* 75.8*  PLT 230 207   Cardiac Enzymes:  Recent Labs Lab 11/25/14 0850  TROPONINI <0.03   BNP: BNP (last 3 results)  Recent Labs  10/14/14 1608  BNP 1512.0*    ProBNP (last 3 results) No results for input(s): PROBNP in the last 8760 hours.  CBG: No results for input(s): GLUCAP in the last 168 hours.     SignedCristal Ford  [facility] 11/26/2014, 7:56 AM

## 2014-11-26 NOTE — Evaluation (Addendum)
Physical Therapy Evaluation Patient Details Name: Sonya [name] MRN: [id number] DOB: [birth year] Today's Date: 11/26/2014   History of Present Illness  HPI: Sonya [name] is a 64 y.o. female with a history of hypertension, hypothyroidism, recent intracerebral bleed, end-stage renal disease who presented to the emergency department with complaints of dizziness and vertigo. Patient states it started approximately one week ago when she felt that the room was spinning. She also admits that she's had a history of this and has not been getting her meclizine. Patient was recently admitted to [facility] rehabilitation when her dizziness started. She stated she was not receiving her "intrauterine medication" at the rehabilitation facility. Patient decided to check herself out. She then presented to her primary care physician's office, on Friday. At that time she was found to have uncontrolled blood pressure and was started on another medication. Patient denies any headache or changes in vision. She denies any chest pain or shortness of breath, abdominal pain, recent illness or travel. In the emergency department, patient's blood pressure was initially found to be 238/97. Patient was given 2 doses of hydralazine 10 mg IV. Her blood pressure has improved to 187/77. CT of the head was also conducted showing no acute intracranial abnormality  Clinical Impression  Pt was seen for evaluation.  She lives with her husband and recently finished rehab at [facility].  She had no reported dizziness with any body position or movement (even reaching down toward her feet).  Her strength and balance are WNL and gait is stable with no assistive device.  No further PT should be needed.    Follow Up Recommendations No PT follow up    Equipment Recommendations  None recommended by PT    Recommendations for Other Services   none    Precautions / Restrictions Precautions Precautions: None Restrictions Weight Bearing Restrictions:  No      Mobility  Bed Mobility Overal bed mobility: Independent       Supine to sit: Independent        Transfers Overall transfer level: Modified independent Equipment used: None Transfers: Sit to/from Stand Sit to Stand: Modified independent (Device/Increase time) Stand pivot transfers: Modified independent (Device/Increase time)          Ambulation/Gait Ambulation/Gait assistance: Modified independent (Device/Increase time) Ambulation Distance (Feet): 60 Feet (limited by ICU lines) Assistive device: None Gait Pattern/deviations: WFL(Within Functional Limits)   Gait velocity interpretation: Below normal speed for age/gender    Stairs                     Balance Overall balance assessment: Modified Independent                                           Pertinent Vitals/Pain Pain Assessment: No/denies pain    Home Living Family/patient expects to be discharged to:: Private residence Living Arrangements: Spouse/significant other Available Help at Discharge: Family;Available 24 hours/day Type of Home: House Home Access: Stairs to enter Entrance Stairs-Rails: Right Entrance Stairs-Number of Steps: 1 Home Layout: One level Home Equipment: Walker - 4 wheels;Bedside commode      Prior Function Level of Independence: Independent               Hand Dominance        Extremity/Trunk Assessment   Upper Extremity Assessment: Overall WFL for tasks assessed  Lower Extremity Assessment: Overall WFL for tasks assessed      Cervical / Trunk Assessment: Kyphotic  Communication   Communication: No difficulties  Cognition Arousal/Alertness: Awake/alert Behavior During Therapy: WFL for tasks assessed/performed Overall Cognitive Status: Within Functional Limits for tasks assessed                      General Comments      Exercises        Assessment/Plan    PT Assessment Patent does not need  any further PT services  PT Diagnosis     PT Problem List    PT Treatment Interventions     PT Goals (Current goals can be found in the Care Plan section) Acute Rehab PT Goals PT Goal Formulation: All assessment and education complete, DC therapy    Frequency     Barriers to discharge        Co-evaluation               End of Session Equipment Utilized During Treatment: Gait belt Activity Tolerance: Patient tolerated treatment well Patient left: in chair;with call bell/phone within reach Nurse Communication: Mobility status         Time: 0910-1001 PT Time Calculation (min) (ACUTE ONLY): 51 min   Charges:   PT Evaluation $Initial PT Evaluation Tier I: 1 Procedure     PT G Codes:    Clinical Judgement   Mobility Evaluation:  CH Goal:  Sonya [name] Discharge:  Sonya [name]    Sonya [name]  PT 11/26/2014, 10:16 AM

## 2014-11-26 NOTE — Care Management Note (Signed)
    Page 1 of 1   11/26/2014     4:05:05 PM CARE MANAGEMENT NOTE 11/26/2014  Patient:  Sonya [name], Sonya [name]   Account Number:  [id number]  Date Initiated:  11/26/2014  Documentation initiated by:  Jolene Provost  Subjective/Objective Assessment:   Pt from home, admitted with hypertensive urgency. Pt recently discharged from SNF. PT recommends no f/u. Pt discharging home today with self care. No CM needs.     Action/Plan:   Anticipated DC Date:  11/26/2014   Anticipated DC Plan:  [place]  CM consult      Choice offered to / List presented to:             Status of service:  Completed, signed off Medicare Important Message given?   (If response is "NO", the following Medicare IM given date fields will be blank) Date Medicare IM given:   Medicare IM given by:   Date Additional Medicare IM given:   Additional Medicare IM given by:    Discharge Disposition:  HOME/SELF CARE  Per UR Regulation:    If discussed at Long Length of Stay Meetings, dates discussed:    Comments:  11/26/2014 [place], RN, MSN, CM

## 2014-11-26 NOTE — ED Notes (Signed)
Patient states she was just discharged for the same thing and "its not getting any better"

## 2014-11-26 NOTE — Discharge Instructions (Signed)
Hypertension °Hypertension, commonly called high blood pressure, is when the force of blood pumping through your arteries is too strong. Your arteries are the blood vessels that carry blood from your heart throughout your body. A blood pressure reading consists of a higher number over a lower number, such as 110/72. The higher number (systolic) is the pressure inside your arteries when your heart pumps. The lower number (diastolic) is the pressure inside your arteries when your heart relaxes. Ideally you want your blood pressure below 120/80. °Hypertension forces your heart to work harder to pump blood. Your arteries may become narrow or stiff. Having hypertension puts you at risk for heart disease, stroke, and other problems.  °RISK FACTORS °Some risk factors for high blood pressure are controllable. Others are not.  °Risk factors you cannot control include:  °· Race. You may be at higher risk if you are African American. °· Age. Risk increases with age. °· Gender. Men are at higher risk than women before age 45 years. After age 65, women are at higher risk than men. °Risk factors you can control include: °· Not getting enough exercise or physical activity. °· Being overweight. °· Getting too much fat, sugar, calories, or salt in your diet. °· Drinking too much alcohol. °SIGNS AND SYMPTOMS °Hypertension does not usually cause signs or symptoms. Extremely high blood pressure (hypertensive crisis) may cause headache, anxiety, shortness of breath, and nosebleed. °DIAGNOSIS  °To check if you have hypertension, your health care provider will measure your blood pressure while you are seated, with your arm held at the level of your heart. It should be measured at least twice using the same arm. Certain conditions can cause a difference in blood pressure between your right and left arms. A blood pressure reading that is higher than normal on one occasion does not mean that you need treatment. If one blood pressure reading  is high, ask your health care provider about having it checked again. °TREATMENT  °Treating high blood pressure includes making lifestyle changes and possibly taking medicine. Living a healthy lifestyle can help lower high blood pressure. You may need to change some of your habits. °Lifestyle changes may include: °· Following the DASH diet. This diet is high in fruits, vegetables, and whole grains. It is low in salt, red meat, and added sugars. °· Getting at least 2½ hours of brisk physical activity every week. °· Losing weight if necessary. °· Not smoking. °· Limiting alcoholic beverages. °· Learning ways to reduce stress. ° If lifestyle changes are not enough to get your blood pressure under control, your health care provider may prescribe medicine. You may need to take more than one. Work closely with your health care provider to understand the risks and benefits. °HOME CARE INSTRUCTIONS °· Have your blood pressure rechecked as directed by your health care provider.   °· Take medicines only as directed by your health care provider. Follow the directions carefully. Blood pressure medicines must be taken as prescribed. The medicine does not work as well when you skip doses. Skipping doses also puts you at risk for problems.   °· Do not smoke.   °· Monitor your blood pressure at home as directed by your health care provider.  °SEEK MEDICAL CARE IF:  °· You think you are having a reaction to medicines taken. °· You have recurrent headaches or feel dizzy. °· You have swelling in your ankles. °· You have trouble with your vision. °SEEK IMMEDIATE MEDICAL CARE IF: °· You develop a severe headache or confusion. °·   You have unusual weakness, numbness, or feel faint.  You have severe chest or abdominal pain.  You vomit repeatedly.  You have trouble breathing. MAKE SURE YOU:   Understand these instructions.  Will watch your condition.  Will get help right away if you are not doing well or get worse. Document  Released: 07/19/2005 Document Revised: 12/03/2013 Document Reviewed: 05/11/2013 [facility] Patient Information 2015 [place], [place]. This information is not intended to replace advice given to you by your health care provider. Make sure you discuss any questions you have with your health care provider. Dizziness  Dizziness means you feel unsteady or lightheaded. You might feel like you are going to pass out (faint). HOME CARE   Drink enough fluids to keep your pee (urine) clear or pale yellow.  Take your medicines exactly as told by your doctor. If you take blood pressure medicine, always stand up slowly from the lying or sitting position. Hold on to something to steady yourself.  If you need to stand in one place for a long time, move your legs often. Tighten and relax your leg muscles.  Have someone stay with you until you feel okay.  Do not drive or use heavy machinery if you feel dizzy.  Do not drink alcohol. GET HELP RIGHT AWAY IF:   You feel dizzy or lightheaded and it gets worse.  You feel sick to your stomach (nauseous), or you throw up (vomit).  You have trouble talking or walking.  You feel weak or have trouble using your arms, hands, or legs.  You cannot think clearly or have trouble forming sentences.  You have chest pain, belly (abdominal) pain, sweating, or you are short of breath.  Your vision changes.  You are bleeding.  You have problems from your medicine that seem to be getting worse. MAKE SURE YOU:   Understand these instructions.  Will watch your condition.  Will get help right away if you are not doing well or get worse. Document Released: 07/08/2011 Document Revised: 10/11/2011 Document Reviewed: 07/08/2011 [facility] Patient Information 2015 [place], [place]. This information is not intended to replace advice given to you by your health care provider. Make sure you discuss any questions you have with your health care provider.

## 2014-11-27 ENCOUNTER — Encounter (HOSPITAL_COMMUNITY): Payer: Self-pay | Admitting: Emergency Medicine

## 2014-11-27 ENCOUNTER — Emergency Department (HOSPITAL_COMMUNITY)
Admission: EM | Admit: 2014-11-27 | Discharge: 2014-11-27 | Disposition: A | Discharge status: Home / Self Care | Payer: Medicare Other | Attending: Emergency Medicine | Admitting: Emergency Medicine

## 2014-11-27 DIAGNOSIS — K219 Gastro-esophageal reflux disease without esophagitis: Secondary | ICD-10-CM | POA: Insufficient documentation

## 2014-11-27 DIAGNOSIS — Z992 Dependence on renal dialysis: Secondary | ICD-10-CM | POA: Diagnosis not present

## 2014-11-27 DIAGNOSIS — N189 Chronic kidney disease, unspecified: Secondary | ICD-10-CM | POA: Diagnosis not present

## 2014-11-27 DIAGNOSIS — D649 Anemia, unspecified: Secondary | ICD-10-CM | POA: Diagnosis not present

## 2014-11-27 DIAGNOSIS — I951 Orthostatic hypotension: Secondary | ICD-10-CM | POA: Diagnosis not present

## 2014-11-27 DIAGNOSIS — Z8542 Personal history of malignant neoplasm of other parts of uterus: Secondary | ICD-10-CM | POA: Diagnosis not present

## 2014-11-27 DIAGNOSIS — I120 Hypertensive chronic kidney disease with stage 5 chronic kidney disease or end stage renal disease: Secondary | ICD-10-CM | POA: Insufficient documentation

## 2014-11-27 DIAGNOSIS — I10 Essential (primary) hypertension: Secondary | ICD-10-CM | POA: Diagnosis present

## 2014-11-27 DIAGNOSIS — Z79899 Other long term (current) drug therapy: Secondary | ICD-10-CM | POA: Insufficient documentation

## 2014-11-27 DIAGNOSIS — R42 Dizziness and giddiness: Secondary | ICD-10-CM

## 2014-11-27 DIAGNOSIS — E039 Hypothyroidism, unspecified: Secondary | ICD-10-CM | POA: Insufficient documentation

## 2014-11-27 DIAGNOSIS — N186 End stage renal disease: Secondary | ICD-10-CM | POA: Diagnosis not present

## 2014-11-27 DIAGNOSIS — Z8669 Personal history of other diseases of the nervous system and sense organs: Secondary | ICD-10-CM | POA: Diagnosis not present

## 2014-11-27 LAB — I-STAT CHEM 8, ED
BUN: 28 mg/dL — ABNORMAL HIGH (ref 6–23)
CREATININE: 5.2 mg/dL — AB (ref 0.50–1.10)
Calcium, Ion: 1.16 mmol/L (ref 1.13–1.30)
Chloride: 98 mmol/L (ref 96–112)
GLUCOSE: 113 mg/dL — AB (ref 70–99)
HEMATOCRIT: 35 % — AB (ref 36.0–46.0)
HEMOGLOBIN: 11.9 g/dL — AB (ref 12.0–15.0)
Potassium: 4.2 mmol/L (ref 3.5–5.1)
SODIUM: 136 mmol/L (ref 135–145)
TCO2: 27 mmol/L (ref 0–100)

## 2014-11-27 MED ORDER — SODIUM CHLORIDE 0.9 % IV BOLUS (SEPSIS): 1000.0000 mL | Freq: Once | INTRAVENOUS | Status: DC

## 2014-11-27 MED ORDER — MECLIZINE HCL 12.5 MG PO TABS
25.0000 mg | ORAL_TABLET | Freq: Once | ORAL | Status: AC
  Administered 2014-11-27: 25 mg via ORAL
  Filled 2014-11-27: qty 2

## 2014-11-27 MED ORDER — SODIUM CHLORIDE 0.9 % IV BOLUS (SEPSIS)
1000.0000 mL | Freq: Once | INTRAVENOUS | Status: AC
  Administered 2014-11-27: 1000 mL via INTRAVENOUS

## 2014-11-27 NOTE — ED Notes (Signed)
Pt reports dizziness x1 week. Pt reports was seen for same last night and reports no improvement. Pt alert and oriented,speech clear. Pt reports history of vertigo.

## 2014-11-27 NOTE — Discharge Instructions (Signed)

## 2014-11-27 NOTE — Consult Note (Addendum)
[facility] Medical Consultation  Sonya [name] [id number] DOB: April 27, 1951 DOA: 11/27/2014 PCP: [name], MD   Requesting physician: Dr. Christy Gentles Date of consultation: 4.27.2016 Reason for consultation: dizziness  Impression/Recommendations Orthostatic hypotension: - She dizzy upon standing, her orthostatics were checked and were positive. Probably her estimated dry weight at the dialysis needs to be a little bit higher. Or she is on too much antihypertensive medications. We will hold these for the next 24 hours. - I agree with giving her IV fluids here in the ED on being discharged. She should not take her blood pressure medications until tomorrow. I encouraged her to take more fluids at home for the next 24 hours. She does not want to be placed at a skilled nursing facility. - Previous MRI CT scan does not show any acute changes.  Essential hypertension: - Hold her antihypertensive medications. - -Allow her blood pressure to be low but on the high side to avoid orthostatics symptoms.  End stage renal disease: - Follow with Dr. Phill Mutter as an outpatient.    Chief Complaint: Dizziness  HPI: 64 year old female with past medical history of essential hypertension, hypothyroidism, recently treated for an intracerebral bleed incision renal disease who comes to the emergency room for dizziness. She relates more like lightheadedness upon standing. She denies any chest pain shortness of breath nausea vomiting or diarrhea. She was recently admitted to the hospital for the same complaint a CT scan of the head and MRI were done that showed no acute findings. Her MRI did show a hydrocele. No edema or midline shift. She was discharged home and she returned on the day of the consult for the same symptoms. Orthostatics were checked in the ED and they were positive her systolic blood pressure dropped from 170 to 130 upon standing, and her pulse went up by 10.  Review of Systems:   Constitutional: Denies fever, chills, diaphoresis, appetite change and fatigue.  HEENT: Denies photophobia, eye pain, redness, hearing loss, ear pain, congestion, sore throat, rhinorrhea, sneezing, mouth sores, trouble swallowing, neck pain, neck stiffness and tinnitus.  Respiratory: Denies SOB, DOE, cough, chest tightness, and wheezing.  Cardiovascular: Denies chest pain, palpitations and leg swelling.  Gastrointestinal: Denies nausea, vomiting, abdominal pain, diarrhea, constipation, blood in stool and abdominal distention.  Genitourinary: Denies dysuria, urgency, frequency, hematuria, flank pain and difficulty urinating.  Musculoskeletal: Denies myalgias, back pain, joint swelling, arthralgias and gait problem.  Skin: Denies pallor, rash and wound.  Neurological: Dizziness.  Hematological: Denies adenopathy. Easy bruising, personal or family bleeding history  Psychiatric/Behavioral: Denies suicidal ideation, mood changes, confusion, nervousness, sleep disturbance and agitation   Past Medical History  Diagnosis Date  . HTN (hypertension)   . Hypothyroidism   . Uterine cancer 2012  . Anemia   . CKD (chronic kidney disease)   . Dysphagia   . Hyperparathyroidism due to renal insufficiency   . GERD (gastroesophageal reflux disease)   . Diverticulitis   . Vertical diplopia    Past Surgical History  Procedure Laterality Date  . Cholecystectomy    . Cesarean section    . Laparoscopic total hysterectomy    . Colonoscopy  2000    [facility], [facility]  . Colonoscopy  2003 NUR BRBPR D50 V6    [facility], [facility]  . Abdominal hysterectomy    . Nasal septum surgery    . Esophagogastroduodenoscopy (egd) with esophageal dilation N/A 06/08/2013    Dr. Fields:Stricture at the gastroesophageal junction/MILD NON-erosive gastritis (inflammation) was found in the gastric  antrum; multiple biopsies/NO SOURCE FOR ANEMIA IDETIFIED-MOST LIKELY DUE TO ANEMIA OF CHRONIC DISEASE  . Colonoscopy N/A 09/04/2013     Procedure: COLONOSCOPY;  Surgeon: Danie Binder, MD;  Location: [facility];  Service: Endoscopy;  Laterality: N/A;  10:30AM-moved to 9:25 [name]  . Appendectomy    . Bascilic vein transposition Right 09/17/2013    Procedure: BRACHIAL VEIN TRANSPOSITION;  Surgeon: Conrad [place], MD;  Location: [facility];  Service: Vascular;  Laterality: Right;  . Insertion of dialysis catheter Left 09/17/2013    Procedure: INSERTION OF DIALYSIS CATHETER;  Surgeon: Conrad [place], MD;  Location: [facility];  Service: Vascular;  Laterality: Left;  . Bascilic vein transposition Right 10/29/2013    Procedure: RIGHT SECOND STAGE BRACHIAL VEIN TRANSPOSITION;  Surgeon: Conrad [place], MD;  Location: [facility];  Service: Vascular;  Laterality: Right;  . Givens capsule study N/A 12/03/2013    Procedure: GIVENS CAPSULE STUDY;  Surgeon: Danie Binder, MD;  Location: [facility];  Service: Endoscopy;  Laterality: N/A;  7:30   Social History:  reports that she has never smoked. She has never used smokeless tobacco. She reports that she does not drink alcohol or use illicit drugs.  Allergies  Allergen Reactions  . Cephalexin Itching   Family History  Problem Relation Age of Onset  . Colon cancer Neg Hx   . Cancer Mother   . Diabetes Mother   . Hypertension Mother   . Diabetes Father   . Hypertension Father   . Hypertension Sister     Prior to Admission medications   Medication Sig Start Date End Date Taking? Authorizing Provider  allopurinol (ZYLOPRIM) 300 MG tablet Take 300 mg by mouth daily.    Historical Provider, MD  cloNIDine (CATAPRES) 0.2 MG tablet Take 1 tablet by mouth 3 (three) times daily. 11/22/14   Historical Provider, MD  docusate sodium (COLACE) 100 MG capsule Take 100 mg by mouth 2 (two) times daily.    Historical Provider, MD  feeding supplement, ENSURE ENLIVE, (ENSURE ENLIVE) LIQD Take 237 mLs by mouth daily. Patient not taking: Reported on 11/26/2014 10/23/14   Eugenie Filler, MD  ferrous  sulfate dried (SLOW FE) 160 (50 FE) MG TBCR SR tablet Take 640 mg by mouth daily.    Historical Provider, MD  hydrALAZINE (APRESOLINE) 50 MG tablet Take 1 tablet (50 mg total) by mouth every 8 (eight) hours. 10/22/14   Reyne Dumas, MD  labetalol (NORMODYNE) 300 MG tablet Take 1 tablet (300 mg total) by mouth 3 (three) times daily. 08/20/13   Kinnie Feil, MD  levothyroxine (SYNTHROID, LEVOTHROID) 50 MCG tablet Take 1 tablet (50 mcg total) by mouth daily. 08/20/13   Kinnie Feil, MD  lidocaine-prilocaine (EMLA) cream Apply 1 application topically as needed (on Dialysis days Tues,Thurs.,Sat.).    Historical Provider, MD  Linaclotide (LINZESS) 290 MCG CAPS capsule 1 PO 30 MINS PRIOR TO BREAKFAST. IF O SATISFACTORY BM AFTER 2 WEEKS, TAKE WITH BREAKFAST. Patient taking differently: Take 290 mcg by mouth every morning.  05/01/14   Danie Binder, MD  meclizine (ANTIVERT) 25 MG tablet Take 1 tablet by mouth 4 (four) times daily as needed. dizziness 11/22/14   Historical Provider, MD  Nutritional Supplements (FEEDING SUPPLEMENT, NEPRO CARB STEADY,) LIQD Take 237 mLs by mouth as needed (missed meal during dialysis.). Patient not taking: Reported on 11/26/2014 10/22/14   Reyne Dumas, MD  omeprazole (PRILOSEC) 20 MG capsule Take 1 capsule (  20 mg total) by mouth daily. 07/29/14   Orvil Feil, NP  sevelamer carbonate (RENVELA) 800 MG tablet Take 800-2,400 mg by mouth 3 (three) times daily with meals. Patient takes 1 tablet with snack and 3 tablets with meal    Historical Provider, MD  sodium bicarbonate 650 MG tablet Take 1 tablet (650 mg total) by mouth 2 (two) times daily. 06/12/13   Kathie Dike, MD   Physical Exam: Blood pressure 180/73, pulse 74, temperature 98.7 F (37.1 C), temperature source Oral, resp. rate 18, height 5\' 6"  (1.676 m), weight 66.225 kg (146 lb), SpO2 98 %. Filed Vitals:   11/27/14 0851  BP: 180/73  Pulse: 74  Temp: 98.7 F (37.1 C)  Resp: 18     General:  She is awake  alert and oriented 3  Eyes: Anicteric no pallor  ENT: Dry mucous membrane  Neck: No JVD  Cardiovascular: Regular rate and rhythm  Respiratory: Good air movement clear to auscultation  Abdomen: Positive bowel sounds nontender nondistended and soft  Skin: Intact  Musculoskeletal: Intact  Psychiatric: Appropriate  Neurologic: He is awake alert and oriented 3 with a coherent for language, 3-12 are grossly intact, sensation is intact throughout, muscle strength 12/01/2002 extremities, and deep tendon reflexes 2+ bilaterally. She has no nystagmus.  Labs on Admission:  Basic Metabolic Panel:  Recent Labs Lab 11/25/14 0850 11/26/14 0457 11/27/14 0946  NA 137 136 136  K 3.9 4.4 4.2  CL 100 98 98  CO2 25 27  --   GLUCOSE 95 96 113*  BUN 38* 49* 28*  CREATININE 6.69* 8.08* 5.20*  CALCIUM 9.6 9.3  --    Liver Function Tests: No results for input(s): AST, ALT, ALKPHOS, BILITOT, PROT, ALBUMIN in the last 168 hours. No results for input(s): LIPASE, AMYLASE in the last 168 hours. No results for input(s): AMMONIA in the last 168 hours. CBC:  Recent Labs Lab 11/25/14 0823 11/26/14 0457 11/27/14 0946  WBC 4.6 6.0  --   NEUTROABS 2.9  --   --   HGB 10.3* 8.7* 11.9*  HCT 33.5* 28.2* 35.0*  MCV 77.0* 75.8*  --   PLT 230 207  --    Cardiac Enzymes:  Recent Labs Lab 11/25/14 0850  TROPONINI <0.03   BNP: Invalid input(s): POCBNP CBG: No results for input(s): GLUCAP in the last 168 hours.  Radiological Exams on Admission: Mr Herby Abraham Contrast  11/25/2014   CLINICAL DATA:  64 year old hypertensive female with history of chronic kidney disease, hyperparathyroidism and uterine cancer presenting with vertigo for the past week. Recent intracranial hemorrhage. Subsequent encounter.  EXAM: MRI HEAD WITHOUT CONTRAST  TECHNIQUE: Multiplanar, multiecho pulse sequences of the brain and surrounding structures were obtained without intravenous contrast.  COMPARISON:  Several prior  exams, most recent head CT 11/25/2014 and most recent brain MR 10/19/2014.  FINDINGS: No acute infarct.  Interval expected evolution/clearing of hemorrhage within the posterior left lenticular nucleus.  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  Otherwise, scattered tiny blood breakdown products supratentorial and infratentorial region most suggestive of episodes of prior hemorrhagic ischemia. Amyloid angiopathy is a secondary less likely consideration.  Prominent small vessel disease type changes.  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 The meningocele measures up to 3.5 cm and protruding brain measures up to 1.2 cm. Appearance without significant change.  Prominent posterior pituitary gland felt to be an incidental finding and unchanged.  Minimal  exophthalmos.  Major intracranial vascular structures are patent.  Bilateral mastoid air cell partial opacification greater on the right without obstructing lesion of drainage of the eustachian tube noted.  No intracranial mass lesion noted separate from above described findings. Contrast not administered.  Hyperostosis frontalis interna incidentally noted.  IMPRESSION: No acute infarct.  Interval expected evolution/clearing of hemorrhage within the posterior left lenticular nucleus.  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  Otherwise scattered tiny blood breakdown products supratentorial and infratentorial region most suggestive of episodes of prior hemorrhagic ischemia. Amyloid angiopathy is a secondary less likely consideration.  Prominent small vessel disease type changes.  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 The meningocele measures up to 3.5 cm and protruding brain measures up to 1.2 cm. Appearance without significant  change.  Bilateral mastoid air cell partial opacification greater on the right   Electronically Signed   By: Genia Del M.D.   On: 11/25/2014 14:35    EKG: Independently reviewed. None  Time spent: 80 minutes  Charlynne Cousins [facility] Pager [phone]  If 7PM-7AM, please contact night-coverage [email or web address] Password [facility] 11/27/2014, 10:59 AM

## 2014-11-27 NOTE — Care Management Utilization Note (Signed)
UR completed 

## 2014-11-27 NOTE — ED Notes (Signed)
Patient states she feels better and states her dizziness has improved when rising to standing position. Patient ambulated to wheelchair with no difficulty.

## 2014-11-27 NOTE — ED Notes (Signed)
Dr. Wickline at bedside.  

## 2014-11-27 NOTE — ED Provider Notes (Signed)
CSN: [id number]     Arrival date & time 11/27/14  [id number] History  This chart was scribed for Ripley Fraise, MD by Mercy Moore, ED scribe.  This patient was seen in room APA15/APA15 and the patient's care was started at 9:03 AM.   Chief Complaint  Patient presents with  . Dizziness   Patient is a 64 y.o. female presenting with dizziness. The history is provided by the patient. No language interpreter was used.  Dizziness Quality:  Vertigo, imbalance and head spinning Duration:  1 week Progression:  Unchanged Chronicity:  Recurrent Context: physical activity and standing up   Relieved by:  Change in position Worsened by:  Standing up Associated symptoms: headaches   Associated symptoms: no chest pain, no shortness of breath, no syncope, no vision changes, no vomiting and no weakness   Risk factors: hx of vertigo and multiple medications    HPI Comments: Sonya [name] is a 64 y.o. female who presents to the Emergency Department complaining of vertigo and weakness, ongoing for one week now. Patient reports admission yesterday for evaluation of her elevated blood pressure and vertigo; patient feels that she was sent home prematurely after receiving dialysis. Patient denies improvement once returning home stating "something isn't right in her head." Patient reports exacerbation of her symptoms with walking/activity and alleviation with sitting/resting. Patient reports historical association of falls and imbalance with her vertigo but denies falling since yesterday. Patient is on Dialysis; last treatment yesterday. Patient states that she took a single dose of her vertigo medication yesterday, but denies any improvement.   Past Medical History  Diagnosis Date  . HTN (hypertension)   . Hypothyroidism   . Uterine cancer 2012  . Anemia   . CKD (chronic kidney disease)   . Dysphagia   . Hyperparathyroidism due to renal insufficiency   . GERD (gastroesophageal reflux disease)   .  Diverticulitis   . Vertical diplopia    Past Surgical History  Procedure Laterality Date  . Cholecystectomy    . Cesarean section    . Laparoscopic total hysterectomy    . Colonoscopy  2000    [facility], [facility]  . Colonoscopy  2003 NUR BRBPR D50 V6    [facility] [facility], [facility]  . Abdominal hysterectomy    . Nasal septum surgery    . Esophagogastroduodenoscopy (egd) with esophageal dilation N/A 06/08/2013    Dr. Fields:Stricture at the gastroesophageal junction/MILD NON-erosive gastritis (inflammation) was found in the gastric antrum; multiple biopsies/NO SOURCE FOR ANEMIA IDETIFIED-MOST LIKELY DUE TO ANEMIA OF CHRONIC DISEASE  . Colonoscopy N/A 09/04/2013    Procedure: COLONOSCOPY;  Surgeon: Danie Binder, MD;  Location: [facility];  Service: Endoscopy;  Laterality: N/A;  10:30AM-moved to 9:25 [name]  . Appendectomy    . Bascilic vein transposition Right 09/17/2013    Procedure: BRACHIAL VEIN TRANSPOSITION;  Surgeon: Conrad [place], MD;  Location: [place];  Service: Vascular;  Laterality: Right;  . Insertion of dialysis catheter Left 09/17/2013    Procedure: INSERTION OF DIALYSIS CATHETER;  Surgeon: Conrad [place], MD;  Location: [place];  Service: Vascular;  Laterality: Left;  . Bascilic vein transposition Right 10/29/2013    Procedure: RIGHT SECOND STAGE BRACHIAL VEIN TRANSPOSITION;  Surgeon: Conrad [place], MD;  Location: [facility];  Service: Vascular;  Laterality: Right;  . Givens capsule study N/A 12/03/2013    Procedure: GIVENS CAPSULE STUDY;  Surgeon: Danie Binder, MD;  Location: [facility];  Service: Endoscopy;  Laterality: N/A;  7:30   Family History  Problem Relation Age of Onset  . Colon cancer Neg Hx   . Cancer Mother   . Diabetes Mother   . Hypertension Mother   . Diabetes Father   . Hypertension Father   . Hypertension Sister    History  Substance Use Topics  . Smoking status: Never Smoker   . Smokeless tobacco: Never Used  . Alcohol Use: No   OB History    Gravida Para Term  Preterm AB TAB SAB Ectopic Multiple Living   1 1 1             Review of Systems  Constitutional: Positive for fatigue. Negative for fever.  Eyes: Negative for visual disturbance.  Respiratory: Negative for shortness of breath.   Cardiovascular: Negative for chest pain and syncope.  Gastrointestinal: Negative for vomiting and abdominal pain.  Genitourinary: Negative for dysuria.  Neurological: Positive for dizziness and headaches. Negative for syncope, speech difficulty and weakness.  All other systems reviewed and are negative.     Allergies  Cephalexin  Home Medications   Prior to Admission medications   Medication Sig Start Date End Date Taking? Authorizing Provider  allopurinol (ZYLOPRIM) 300 MG tablet Take 300 mg by mouth daily.    Historical Provider, MD  cloNIDine (CATAPRES) 0.2 MG tablet Take 1 tablet by mouth 3 (three) times daily. 11/22/14   Historical Provider, MD  docusate sodium (COLACE) 100 MG capsule Take 100 mg by mouth 2 (two) times daily.    Historical Provider, MD  feeding supplement, ENSURE ENLIVE, (ENSURE ENLIVE) LIQD Take 237 mLs by mouth daily. Patient not taking: Reported on 11/26/2014 10/23/14   Eugenie Filler, MD  ferrous sulfate dried (SLOW FE) 160 (50 FE) MG TBCR SR tablet Take 640 mg by mouth daily.    Historical Provider, MD  hydrALAZINE (APRESOLINE) 50 MG tablet Take 1 tablet (50 mg total) by mouth every 8 (eight) hours. 10/22/14   Reyne Dumas, MD  labetalol (NORMODYNE) 300 MG tablet Take 1 tablet (300 mg total) by mouth 3 (three) times daily. 08/20/13   Kinnie Feil, MD  levothyroxine (SYNTHROID, LEVOTHROID) 50 MCG tablet Take 1 tablet (50 mcg total) by mouth daily. 08/20/13   Kinnie Feil, MD  lidocaine-prilocaine (EMLA) cream Apply 1 application topically as needed (on Dialysis days Tues,Thurs.,Sat.).    Historical Provider, MD  Linaclotide (LINZESS) 290 MCG CAPS capsule 1 PO 30 MINS PRIOR TO BREAKFAST. IF O SATISFACTORY BM AFTER 2 WEEKS, TAKE  WITH BREAKFAST. Patient taking differently: Take 290 mcg by mouth every morning.  05/01/14   Danie Binder, MD  meclizine (ANTIVERT) 25 MG tablet Take 1 tablet by mouth 4 (four) times daily as needed. dizziness 11/22/14   Historical Provider, MD  Nutritional Supplements (FEEDING SUPPLEMENT, NEPRO CARB STEADY,) LIQD Take 237 mLs by mouth as needed (missed meal during dialysis.). Patient not taking: Reported on 11/26/2014 10/22/14   Reyne Dumas, MD  omeprazole (PRILOSEC) 20 MG capsule Take 1 capsule (20 mg total) by mouth daily. 07/29/14   Orvil Feil, NP  sevelamer carbonate (RENVELA) 800 MG tablet Take 800-2,400 mg by mouth 3 (three) times daily with meals. Patient takes 1 tablet with snack and 3 tablets with meal    Historical Provider, MD  sodium bicarbonate 650 MG tablet Take 1 tablet (650 mg total) by mouth 2 (two) times daily. 06/12/13   Kathie Dike, MD   Triage Vitals: BP 180/73 mmHg  Pulse 74  Temp(Src) 98.7 F (37.1 C) (Oral)  Resp 18  Ht 5\' 6"  (1.676 m)  Wt 146 lb (66.225 kg)  BMI 23.58 kg/m2  SpO2 98% Physical Exam  Nursing note and vitals reviewed.  CONSTITUTIONAL: Well developed/well nourished HEAD: Normocephalic/atraumatic EYES: EOMI/PERRL, no nystagmus, no ptosis,  ENMT: Mucous membranes moist NECK: supple no meningeal signs, no bruits SPINE/BACK:entire spine nontender CV: S1/S2 noted, murmur noted LUNGS: Lungs are clear to auscultation bilaterally, no apparent distress ABDOMEN: soft, nontender, no rebound or guarding GU:no cva tenderness NEURO:Awake/alert, facies symmetric, no arm or leg drift is noted Cranial nerves 3/4/5/6/02/07/09/11/12 tested and intact No past pointing Sensation to light touch intact in all extremities.  Pt can ambulate EXTREMITIES: pulses normal, full ROM, dialysis access to right UE with thrill noted SKIN: warm, color normal PSYCH: no abnormalities of mood noted, alert and oriented to situation    ED Course  Procedures  COORDINATION OF  CARE: 9:19 AM- Patient insistent that she does want to be sent home until her head feels better. Discussed treatment plan with patient at bedside and patient agreed to plan.  10:27 AM D/w dr Venetia Constable Just discharged from hospital.  MRI did not show acute stroke (has known meningocele, unchanged) He will review chart and interview patient  Seen by triad dr Venetia Constable Pt did not want to be admitted Felt likely due to orthostatic hypotension She received IV fluids Pt felt improved Dr Olevia Bowens recommended holding BP meds until tomorrow Pt would like to go home  Medications  meclizine (ANTIVERT) tablet 25 mg (25 mg Oral Given 11/27/14 0948)  sodium chloride 0.9 % bolus 1,000 mL (0 mLs Intravenous Stopped 11/27/14 1203)    Labs Review Labs Reviewed  I-STAT CHEM 8, ED - Abnormal; Notable for the following:    BUN 28 (*)    Creatinine, Ser 5.20 (*)    Glucose, Bld 113 (*)    Hemoglobin 11.9 (*)    HCT 35.0 (*)    All other components within normal limits    Imaging Review Ct Head Wo Contrast  11/25/2014   CLINICAL DATA:  Dizziness. Onset of symptoms this morning. Initial encounter.  EXAM: CT HEAD WITHOUT CONTRAST  TECHNIQUE: Contiguous axial images were obtained from the base of the skull through the vertex without intravenous contrast.  COMPARISON:  MRI brain 10/19/2014.  FINDINGS: No mass lesion, mass effect, midline shift, hydrocephalus, hemorrhage. No acute territorial cortical ischemia/infarct. Atrophy and chronic ischemic white matter disease is present. Encephalomalacia is present in the LEFT basal ganglia compatible with recent infarct. This is difficult to distinguish from adjacent chronic ischemic white matter disease. Chronic bilateral thalamic lacunar infarcts. LEFT Middle cranial fossa encephalocele without interval change.  IMPRESSION: Atrophy and chronic ischemic white matter disease without acute intracranial abnormality.   Electronically Signed   By: Dereck Ligas M.D.   On: 11/25/2014  09:18   Mr Brain Wo Contrast  11/25/2014   CLINICAL DATA:  64 year old hypertensive female with history of chronic kidney disease, hyperparathyroidism and uterine cancer presenting with vertigo for the past week. Recent intracranial hemorrhage. Subsequent encounter.  EXAM: MRI HEAD WITHOUT CONTRAST  TECHNIQUE: Multiplanar, multiecho pulse sequences of the brain and surrounding structures were obtained without intravenous contrast.  COMPARISON:  Several prior exams, most recent head CT 11/25/2014 and most recent brain MR 10/19/2014.  FINDINGS: No acute infarct.  Interval expected evolution/clearing of hemorrhage within the posterior left lenticular nucleus.  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  Otherwise, scattered tiny blood  breakdown products supratentorial and infratentorial region most suggestive of episodes of prior hemorrhagic ischemia. Amyloid angiopathy is a secondary less likely consideration.  Prominent small vessel disease type changes.  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 The meningocele measures up to 3.5 cm and protruding brain measures up to 1.2 cm. Appearance without significant change.  Prominent posterior pituitary gland felt to be an incidental finding and unchanged.  Minimal exophthalmos.  Major intracranial vascular structures are patent.  Bilateral mastoid air cell partial opacification greater on the right without obstructing lesion of drainage of the eustachian tube noted.  No intracranial mass lesion noted separate from above described findings. Contrast not administered.  Hyperostosis frontalis interna incidentally noted.  IMPRESSION: No acute infarct.  Interval expected evolution/clearing of hemorrhage within the posterior left lenticular nucleus.  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  Otherwise scattered tiny blood  breakdown products supratentorial and infratentorial region most suggestive of episodes of prior hemorrhagic ischemia. Amyloid angiopathy is a secondary less likely consideration.  Prominent small vessel disease type changes.  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 The meningocele measures up to 3.5 cm and protruding brain measures up to 1.2 cm. Appearance without significant change.  Bilateral mastoid air cell partial opacification greater on the right   Electronically Signed   By: Genia Del M.D.   On: 11/25/2014 14:35     EKG Interpretation   Date/Time:  Wednesday November 27 2014 09:24:05 EDT Ventricular Rate:  79 PR Interval:  165 QRS Duration: 83 QT Interval:  433 QTC Calculation: 496 R Axis:   57 Text Interpretation:  Sinus rhythm Probable left atrial enlargement Left  ventricular hypertrophy Borderline prolonged QT interval Baseline wander  in lead(s) V2 No significant change since last tracing Confirmed by  Christy Gentles  MD, Elenore Rota ([id number]) on 11/27/2014 9:46:37 AM      MDM   Final diagnoses:  Dizziness  Chronic renal failure, unspecified stage    Nursing notes including past medical history and social history reviewed and considered in documentation Labs/vital reviewed myself and considered during evaluation Previous records reviewed and considered    I personally performed the services described in this documentation, which was scribed in my presence. The recorded information has been reviewed and is accurate.      Ripley Fraise, MD 11/27/14 [id number]

## 2014-11-28 DIAGNOSIS — Z992 Dependence on renal dialysis: Secondary | ICD-10-CM | POA: Diagnosis not present

## 2014-11-28 DIAGNOSIS — N186 End stage renal disease: Secondary | ICD-10-CM | POA: Diagnosis not present

## 2014-11-28 DIAGNOSIS — N2581 Secondary hyperparathyroidism of renal origin: Secondary | ICD-10-CM | POA: Diagnosis not present

## 2014-11-28 DIAGNOSIS — D509 Iron deficiency anemia, unspecified: Secondary | ICD-10-CM | POA: Diagnosis not present

## 2014-11-30 DIAGNOSIS — D509 Iron deficiency anemia, unspecified: Secondary | ICD-10-CM | POA: Diagnosis not present

## 2014-11-30 DIAGNOSIS — Z992 Dependence on renal dialysis: Secondary | ICD-10-CM | POA: Diagnosis not present

## 2014-11-30 DIAGNOSIS — N186 End stage renal disease: Secondary | ICD-10-CM | POA: Diagnosis not present

## 2014-11-30 DIAGNOSIS — N2581 Secondary hyperparathyroidism of renal origin: Secondary | ICD-10-CM | POA: Diagnosis not present

## 2014-12-03 DIAGNOSIS — D631 Anemia in chronic kidney disease: Secondary | ICD-10-CM | POA: Diagnosis not present

## 2014-12-03 DIAGNOSIS — N186 End stage renal disease: Secondary | ICD-10-CM | POA: Diagnosis not present

## 2014-12-03 DIAGNOSIS — I10 Essential (primary) hypertension: Secondary | ICD-10-CM | POA: Diagnosis not present

## 2014-12-03 DIAGNOSIS — K219 Gastro-esophageal reflux disease without esophagitis: Secondary | ICD-10-CM | POA: Diagnosis not present

## 2014-12-03 DIAGNOSIS — Z992 Dependence on renal dialysis: Secondary | ICD-10-CM | POA: Diagnosis not present

## 2014-12-03 DIAGNOSIS — N2581 Secondary hyperparathyroidism of renal origin: Secondary | ICD-10-CM | POA: Diagnosis not present

## 2014-12-03 DIAGNOSIS — Z681 Body mass index (BMI) 19 or less, adult: Secondary | ICD-10-CM | POA: Diagnosis not present

## 2014-12-03 DIAGNOSIS — H81399 Other peripheral vertigo, unspecified ear: Secondary | ICD-10-CM | POA: Diagnosis not present

## 2014-12-05 DIAGNOSIS — N186 End stage renal disease: Secondary | ICD-10-CM | POA: Diagnosis not present

## 2014-12-05 DIAGNOSIS — D631 Anemia in chronic kidney disease: Secondary | ICD-10-CM | POA: Diagnosis not present

## 2014-12-05 DIAGNOSIS — Z992 Dependence on renal dialysis: Secondary | ICD-10-CM | POA: Diagnosis not present

## 2014-12-05 DIAGNOSIS — N2581 Secondary hyperparathyroidism of renal origin: Secondary | ICD-10-CM | POA: Diagnosis not present

## 2014-12-07 DIAGNOSIS — Z992 Dependence on renal dialysis: Secondary | ICD-10-CM | POA: Diagnosis not present

## 2014-12-07 DIAGNOSIS — D631 Anemia in chronic kidney disease: Secondary | ICD-10-CM | POA: Diagnosis not present

## 2014-12-07 DIAGNOSIS — N2581 Secondary hyperparathyroidism of renal origin: Secondary | ICD-10-CM | POA: Diagnosis not present

## 2014-12-07 DIAGNOSIS — N186 End stage renal disease: Secondary | ICD-10-CM | POA: Diagnosis not present

## 2014-12-10 DIAGNOSIS — Z992 Dependence on renal dialysis: Secondary | ICD-10-CM | POA: Diagnosis not present

## 2014-12-10 DIAGNOSIS — N186 End stage renal disease: Secondary | ICD-10-CM | POA: Diagnosis not present

## 2014-12-10 DIAGNOSIS — N2581 Secondary hyperparathyroidism of renal origin: Secondary | ICD-10-CM | POA: Diagnosis not present

## 2014-12-10 DIAGNOSIS — D631 Anemia in chronic kidney disease: Secondary | ICD-10-CM | POA: Diagnosis not present

## 2014-12-12 DIAGNOSIS — D631 Anemia in chronic kidney disease: Secondary | ICD-10-CM | POA: Diagnosis not present

## 2014-12-12 DIAGNOSIS — N2581 Secondary hyperparathyroidism of renal origin: Secondary | ICD-10-CM | POA: Diagnosis not present

## 2014-12-12 DIAGNOSIS — Z992 Dependence on renal dialysis: Secondary | ICD-10-CM | POA: Diagnosis not present

## 2014-12-12 DIAGNOSIS — N186 End stage renal disease: Secondary | ICD-10-CM | POA: Diagnosis not present

## 2014-12-14 DIAGNOSIS — Z992 Dependence on renal dialysis: Secondary | ICD-10-CM | POA: Diagnosis not present

## 2014-12-14 DIAGNOSIS — N186 End stage renal disease: Secondary | ICD-10-CM | POA: Diagnosis not present

## 2014-12-14 DIAGNOSIS — N2581 Secondary hyperparathyroidism of renal origin: Secondary | ICD-10-CM | POA: Diagnosis not present

## 2014-12-14 DIAGNOSIS — D631 Anemia in chronic kidney disease: Secondary | ICD-10-CM | POA: Diagnosis not present

## 2014-12-17 DIAGNOSIS — N186 End stage renal disease: Secondary | ICD-10-CM | POA: Diagnosis not present

## 2014-12-17 DIAGNOSIS — N2581 Secondary hyperparathyroidism of renal origin: Secondary | ICD-10-CM | POA: Diagnosis not present

## 2014-12-17 DIAGNOSIS — Z992 Dependence on renal dialysis: Secondary | ICD-10-CM | POA: Diagnosis not present

## 2014-12-17 DIAGNOSIS — D631 Anemia in chronic kidney disease: Secondary | ICD-10-CM | POA: Diagnosis not present

## 2014-12-19 DIAGNOSIS — N2581 Secondary hyperparathyroidism of renal origin: Secondary | ICD-10-CM | POA: Diagnosis not present

## 2014-12-19 DIAGNOSIS — N186 End stage renal disease: Secondary | ICD-10-CM | POA: Diagnosis not present

## 2014-12-19 DIAGNOSIS — D631 Anemia in chronic kidney disease: Secondary | ICD-10-CM | POA: Diagnosis not present

## 2014-12-19 DIAGNOSIS — Z992 Dependence on renal dialysis: Secondary | ICD-10-CM | POA: Diagnosis not present

## 2014-12-21 DIAGNOSIS — N2581 Secondary hyperparathyroidism of renal origin: Secondary | ICD-10-CM | POA: Diagnosis not present

## 2014-12-21 DIAGNOSIS — N186 End stage renal disease: Secondary | ICD-10-CM | POA: Diagnosis not present

## 2014-12-21 DIAGNOSIS — D631 Anemia in chronic kidney disease: Secondary | ICD-10-CM | POA: Diagnosis not present

## 2014-12-21 DIAGNOSIS — Z992 Dependence on renal dialysis: Secondary | ICD-10-CM | POA: Diagnosis not present

## 2014-12-24 DIAGNOSIS — N186 End stage renal disease: Secondary | ICD-10-CM | POA: Diagnosis not present

## 2014-12-24 DIAGNOSIS — D631 Anemia in chronic kidney disease: Secondary | ICD-10-CM | POA: Diagnosis not present

## 2014-12-24 DIAGNOSIS — N2581 Secondary hyperparathyroidism of renal origin: Secondary | ICD-10-CM | POA: Diagnosis not present

## 2014-12-24 DIAGNOSIS — Z992 Dependence on renal dialysis: Secondary | ICD-10-CM | POA: Diagnosis not present

## 2014-12-26 DIAGNOSIS — D631 Anemia in chronic kidney disease: Secondary | ICD-10-CM | POA: Diagnosis not present

## 2014-12-26 DIAGNOSIS — Z992 Dependence on renal dialysis: Secondary | ICD-10-CM | POA: Diagnosis not present

## 2014-12-26 DIAGNOSIS — N2581 Secondary hyperparathyroidism of renal origin: Secondary | ICD-10-CM | POA: Diagnosis not present

## 2014-12-26 DIAGNOSIS — N186 End stage renal disease: Secondary | ICD-10-CM | POA: Diagnosis not present

## 2014-12-28 DIAGNOSIS — Z992 Dependence on renal dialysis: Secondary | ICD-10-CM | POA: Diagnosis not present

## 2014-12-28 DIAGNOSIS — N186 End stage renal disease: Secondary | ICD-10-CM | POA: Diagnosis not present

## 2014-12-28 DIAGNOSIS — N2581 Secondary hyperparathyroidism of renal origin: Secondary | ICD-10-CM | POA: Diagnosis not present

## 2014-12-28 DIAGNOSIS — D631 Anemia in chronic kidney disease: Secondary | ICD-10-CM | POA: Diagnosis not present

## 2014-12-31 DIAGNOSIS — D631 Anemia in chronic kidney disease: Secondary | ICD-10-CM | POA: Diagnosis not present

## 2014-12-31 DIAGNOSIS — Z992 Dependence on renal dialysis: Secondary | ICD-10-CM | POA: Diagnosis not present

## 2014-12-31 DIAGNOSIS — N2581 Secondary hyperparathyroidism of renal origin: Secondary | ICD-10-CM | POA: Diagnosis not present

## 2014-12-31 DIAGNOSIS — N186 End stage renal disease: Secondary | ICD-10-CM | POA: Diagnosis not present

## 2015-01-02 DIAGNOSIS — D631 Anemia in chronic kidney disease: Secondary | ICD-10-CM | POA: Diagnosis not present

## 2015-01-02 DIAGNOSIS — N186 End stage renal disease: Secondary | ICD-10-CM | POA: Diagnosis not present

## 2015-01-02 DIAGNOSIS — N2581 Secondary hyperparathyroidism of renal origin: Secondary | ICD-10-CM | POA: Diagnosis not present

## 2015-01-02 DIAGNOSIS — D509 Iron deficiency anemia, unspecified: Secondary | ICD-10-CM | POA: Diagnosis not present

## 2015-01-02 DIAGNOSIS — Z992 Dependence on renal dialysis: Secondary | ICD-10-CM | POA: Diagnosis not present

## 2015-01-04 DIAGNOSIS — Z992 Dependence on renal dialysis: Secondary | ICD-10-CM | POA: Diagnosis not present

## 2015-01-04 DIAGNOSIS — D631 Anemia in chronic kidney disease: Secondary | ICD-10-CM | POA: Diagnosis not present

## 2015-01-04 DIAGNOSIS — N2581 Secondary hyperparathyroidism of renal origin: Secondary | ICD-10-CM | POA: Diagnosis not present

## 2015-01-04 DIAGNOSIS — D509 Iron deficiency anemia, unspecified: Secondary | ICD-10-CM | POA: Diagnosis not present

## 2015-01-04 DIAGNOSIS — N186 End stage renal disease: Secondary | ICD-10-CM | POA: Diagnosis not present

## 2015-01-07 DIAGNOSIS — N2581 Secondary hyperparathyroidism of renal origin: Secondary | ICD-10-CM | POA: Diagnosis not present

## 2015-01-07 DIAGNOSIS — N186 End stage renal disease: Secondary | ICD-10-CM | POA: Diagnosis not present

## 2015-01-07 DIAGNOSIS — Z992 Dependence on renal dialysis: Secondary | ICD-10-CM | POA: Diagnosis not present

## 2015-01-07 DIAGNOSIS — D509 Iron deficiency anemia, unspecified: Secondary | ICD-10-CM | POA: Diagnosis not present

## 2015-01-07 DIAGNOSIS — D631 Anemia in chronic kidney disease: Secondary | ICD-10-CM | POA: Diagnosis not present

## 2015-01-09 DIAGNOSIS — Z992 Dependence on renal dialysis: Secondary | ICD-10-CM | POA: Diagnosis not present

## 2015-01-09 DIAGNOSIS — D631 Anemia in chronic kidney disease: Secondary | ICD-10-CM | POA: Diagnosis not present

## 2015-01-09 DIAGNOSIS — D509 Iron deficiency anemia, unspecified: Secondary | ICD-10-CM | POA: Diagnosis not present

## 2015-01-09 DIAGNOSIS — N186 End stage renal disease: Secondary | ICD-10-CM | POA: Diagnosis not present

## 2015-01-09 DIAGNOSIS — N2581 Secondary hyperparathyroidism of renal origin: Secondary | ICD-10-CM | POA: Diagnosis not present

## 2015-01-11 DIAGNOSIS — D631 Anemia in chronic kidney disease: Secondary | ICD-10-CM | POA: Diagnosis not present

## 2015-01-11 DIAGNOSIS — D509 Iron deficiency anemia, unspecified: Secondary | ICD-10-CM | POA: Diagnosis not present

## 2015-01-11 DIAGNOSIS — Z992 Dependence on renal dialysis: Secondary | ICD-10-CM | POA: Diagnosis not present

## 2015-01-11 DIAGNOSIS — N186 End stage renal disease: Secondary | ICD-10-CM | POA: Diagnosis not present

## 2015-01-11 DIAGNOSIS — N2581 Secondary hyperparathyroidism of renal origin: Secondary | ICD-10-CM | POA: Diagnosis not present

## 2015-01-14 DIAGNOSIS — Z992 Dependence on renal dialysis: Secondary | ICD-10-CM | POA: Diagnosis not present

## 2015-01-14 DIAGNOSIS — N2581 Secondary hyperparathyroidism of renal origin: Secondary | ICD-10-CM | POA: Diagnosis not present

## 2015-01-14 DIAGNOSIS — D509 Iron deficiency anemia, unspecified: Secondary | ICD-10-CM | POA: Diagnosis not present

## 2015-01-14 DIAGNOSIS — N186 End stage renal disease: Secondary | ICD-10-CM | POA: Diagnosis not present

## 2015-01-14 DIAGNOSIS — D631 Anemia in chronic kidney disease: Secondary | ICD-10-CM | POA: Diagnosis not present

## 2015-01-16 DIAGNOSIS — D631 Anemia in chronic kidney disease: Secondary | ICD-10-CM | POA: Diagnosis not present

## 2015-01-16 DIAGNOSIS — S2096XA Insect bite (nonvenomous) of unspecified parts of thorax, initial encounter: Secondary | ICD-10-CM | POA: Diagnosis not present

## 2015-01-16 DIAGNOSIS — D509 Iron deficiency anemia, unspecified: Secondary | ICD-10-CM | POA: Diagnosis not present

## 2015-01-16 DIAGNOSIS — T1490 Injury, unspecified: Secondary | ICD-10-CM | POA: Diagnosis not present

## 2015-01-16 DIAGNOSIS — Z992 Dependence on renal dialysis: Secondary | ICD-10-CM | POA: Diagnosis not present

## 2015-01-16 DIAGNOSIS — N186 End stage renal disease: Secondary | ICD-10-CM | POA: Diagnosis not present

## 2015-01-16 DIAGNOSIS — N2581 Secondary hyperparathyroidism of renal origin: Secondary | ICD-10-CM | POA: Diagnosis not present

## 2015-01-18 DIAGNOSIS — D509 Iron deficiency anemia, unspecified: Secondary | ICD-10-CM | POA: Diagnosis not present

## 2015-01-18 DIAGNOSIS — N186 End stage renal disease: Secondary | ICD-10-CM | POA: Diagnosis not present

## 2015-01-18 DIAGNOSIS — D631 Anemia in chronic kidney disease: Secondary | ICD-10-CM | POA: Diagnosis not present

## 2015-01-18 DIAGNOSIS — N2581 Secondary hyperparathyroidism of renal origin: Secondary | ICD-10-CM | POA: Diagnosis not present

## 2015-01-18 DIAGNOSIS — Z992 Dependence on renal dialysis: Secondary | ICD-10-CM | POA: Diagnosis not present

## 2015-01-21 DIAGNOSIS — N2581 Secondary hyperparathyroidism of renal origin: Secondary | ICD-10-CM | POA: Diagnosis not present

## 2015-01-21 DIAGNOSIS — D509 Iron deficiency anemia, unspecified: Secondary | ICD-10-CM | POA: Diagnosis not present

## 2015-01-21 DIAGNOSIS — Z992 Dependence on renal dialysis: Secondary | ICD-10-CM | POA: Diagnosis not present

## 2015-01-21 DIAGNOSIS — N186 End stage renal disease: Secondary | ICD-10-CM | POA: Diagnosis not present

## 2015-01-21 DIAGNOSIS — D631 Anemia in chronic kidney disease: Secondary | ICD-10-CM | POA: Diagnosis not present

## 2015-01-23 DIAGNOSIS — D631 Anemia in chronic kidney disease: Secondary | ICD-10-CM | POA: Diagnosis not present

## 2015-01-23 DIAGNOSIS — Z992 Dependence on renal dialysis: Secondary | ICD-10-CM | POA: Diagnosis not present

## 2015-01-23 DIAGNOSIS — N186 End stage renal disease: Secondary | ICD-10-CM | POA: Diagnosis not present

## 2015-01-23 DIAGNOSIS — N2581 Secondary hyperparathyroidism of renal origin: Secondary | ICD-10-CM | POA: Diagnosis not present

## 2015-01-23 DIAGNOSIS — D509 Iron deficiency anemia, unspecified: Secondary | ICD-10-CM | POA: Diagnosis not present

## 2015-01-25 DIAGNOSIS — N2581 Secondary hyperparathyroidism of renal origin: Secondary | ICD-10-CM | POA: Diagnosis not present

## 2015-01-25 DIAGNOSIS — N186 End stage renal disease: Secondary | ICD-10-CM | POA: Diagnosis not present

## 2015-01-25 DIAGNOSIS — D631 Anemia in chronic kidney disease: Secondary | ICD-10-CM | POA: Diagnosis not present

## 2015-01-25 DIAGNOSIS — Z992 Dependence on renal dialysis: Secondary | ICD-10-CM | POA: Diagnosis not present

## 2015-01-25 DIAGNOSIS — D509 Iron deficiency anemia, unspecified: Secondary | ICD-10-CM | POA: Diagnosis not present

## 2015-01-28 DIAGNOSIS — Z992 Dependence on renal dialysis: Secondary | ICD-10-CM | POA: Diagnosis not present

## 2015-01-28 DIAGNOSIS — N2581 Secondary hyperparathyroidism of renal origin: Secondary | ICD-10-CM | POA: Diagnosis not present

## 2015-01-28 DIAGNOSIS — D509 Iron deficiency anemia, unspecified: Secondary | ICD-10-CM | POA: Diagnosis not present

## 2015-01-28 DIAGNOSIS — D631 Anemia in chronic kidney disease: Secondary | ICD-10-CM | POA: Diagnosis not present

## 2015-01-28 DIAGNOSIS — N186 End stage renal disease: Secondary | ICD-10-CM | POA: Diagnosis not present

## 2015-01-30 DIAGNOSIS — Z992 Dependence on renal dialysis: Secondary | ICD-10-CM | POA: Diagnosis not present

## 2015-01-30 DIAGNOSIS — N2581 Secondary hyperparathyroidism of renal origin: Secondary | ICD-10-CM | POA: Diagnosis not present

## 2015-01-30 DIAGNOSIS — D631 Anemia in chronic kidney disease: Secondary | ICD-10-CM | POA: Diagnosis not present

## 2015-01-30 DIAGNOSIS — D509 Iron deficiency anemia, unspecified: Secondary | ICD-10-CM | POA: Diagnosis not present

## 2015-01-30 DIAGNOSIS — N186 End stage renal disease: Secondary | ICD-10-CM | POA: Diagnosis not present

## 2015-02-01 DIAGNOSIS — D631 Anemia in chronic kidney disease: Secondary | ICD-10-CM | POA: Diagnosis not present

## 2015-02-01 DIAGNOSIS — N186 End stage renal disease: Secondary | ICD-10-CM | POA: Diagnosis not present

## 2015-02-01 DIAGNOSIS — Z23 Encounter for immunization: Secondary | ICD-10-CM | POA: Diagnosis not present

## 2015-02-01 DIAGNOSIS — Z992 Dependence on renal dialysis: Secondary | ICD-10-CM | POA: Diagnosis not present

## 2015-02-01 DIAGNOSIS — N2581 Secondary hyperparathyroidism of renal origin: Secondary | ICD-10-CM | POA: Diagnosis not present

## 2015-02-01 DIAGNOSIS — D509 Iron deficiency anemia, unspecified: Secondary | ICD-10-CM | POA: Diagnosis not present

## 2015-02-04 DIAGNOSIS — N186 End stage renal disease: Secondary | ICD-10-CM | POA: Diagnosis not present

## 2015-02-04 DIAGNOSIS — Z992 Dependence on renal dialysis: Secondary | ICD-10-CM | POA: Diagnosis not present

## 2015-02-04 DIAGNOSIS — N2581 Secondary hyperparathyroidism of renal origin: Secondary | ICD-10-CM | POA: Diagnosis not present

## 2015-02-04 DIAGNOSIS — D509 Iron deficiency anemia, unspecified: Secondary | ICD-10-CM | POA: Diagnosis not present

## 2015-02-04 DIAGNOSIS — Z23 Encounter for immunization: Secondary | ICD-10-CM | POA: Diagnosis not present

## 2015-02-04 DIAGNOSIS — D631 Anemia in chronic kidney disease: Secondary | ICD-10-CM | POA: Diagnosis not present

## 2015-02-06 DIAGNOSIS — D509 Iron deficiency anemia, unspecified: Secondary | ICD-10-CM | POA: Diagnosis not present

## 2015-02-06 DIAGNOSIS — N186 End stage renal disease: Secondary | ICD-10-CM | POA: Diagnosis not present

## 2015-02-06 DIAGNOSIS — Z992 Dependence on renal dialysis: Secondary | ICD-10-CM | POA: Diagnosis not present

## 2015-02-06 DIAGNOSIS — D631 Anemia in chronic kidney disease: Secondary | ICD-10-CM | POA: Diagnosis not present

## 2015-02-06 DIAGNOSIS — N2581 Secondary hyperparathyroidism of renal origin: Secondary | ICD-10-CM | POA: Diagnosis not present

## 2015-02-06 DIAGNOSIS — Z23 Encounter for immunization: Secondary | ICD-10-CM | POA: Diagnosis not present

## 2015-02-08 DIAGNOSIS — Z992 Dependence on renal dialysis: Secondary | ICD-10-CM | POA: Diagnosis not present

## 2015-02-08 DIAGNOSIS — D509 Iron deficiency anemia, unspecified: Secondary | ICD-10-CM | POA: Diagnosis not present

## 2015-02-08 DIAGNOSIS — N186 End stage renal disease: Secondary | ICD-10-CM | POA: Diagnosis not present

## 2015-02-08 DIAGNOSIS — D631 Anemia in chronic kidney disease: Secondary | ICD-10-CM | POA: Diagnosis not present

## 2015-02-08 DIAGNOSIS — Z23 Encounter for immunization: Secondary | ICD-10-CM | POA: Diagnosis not present

## 2015-02-08 DIAGNOSIS — N2581 Secondary hyperparathyroidism of renal origin: Secondary | ICD-10-CM | POA: Diagnosis not present

## 2015-02-11 DIAGNOSIS — D631 Anemia in chronic kidney disease: Secondary | ICD-10-CM | POA: Diagnosis not present

## 2015-02-11 DIAGNOSIS — Z992 Dependence on renal dialysis: Secondary | ICD-10-CM | POA: Diagnosis not present

## 2015-02-11 DIAGNOSIS — D509 Iron deficiency anemia, unspecified: Secondary | ICD-10-CM | POA: Diagnosis not present

## 2015-02-11 DIAGNOSIS — Z23 Encounter for immunization: Secondary | ICD-10-CM | POA: Diagnosis not present

## 2015-02-11 DIAGNOSIS — N186 End stage renal disease: Secondary | ICD-10-CM | POA: Diagnosis not present

## 2015-02-11 DIAGNOSIS — N2581 Secondary hyperparathyroidism of renal origin: Secondary | ICD-10-CM | POA: Diagnosis not present

## 2015-02-13 DIAGNOSIS — D631 Anemia in chronic kidney disease: Secondary | ICD-10-CM | POA: Diagnosis not present

## 2015-02-13 DIAGNOSIS — D509 Iron deficiency anemia, unspecified: Secondary | ICD-10-CM | POA: Diagnosis not present

## 2015-02-13 DIAGNOSIS — Z992 Dependence on renal dialysis: Secondary | ICD-10-CM | POA: Diagnosis not present

## 2015-02-13 DIAGNOSIS — N186 End stage renal disease: Secondary | ICD-10-CM | POA: Diagnosis not present

## 2015-02-13 DIAGNOSIS — N2581 Secondary hyperparathyroidism of renal origin: Secondary | ICD-10-CM | POA: Diagnosis not present

## 2015-02-13 DIAGNOSIS — Z23 Encounter for immunization: Secondary | ICD-10-CM | POA: Diagnosis not present

## 2015-02-15 DIAGNOSIS — D631 Anemia in chronic kidney disease: Secondary | ICD-10-CM | POA: Diagnosis not present

## 2015-02-15 DIAGNOSIS — Z992 Dependence on renal dialysis: Secondary | ICD-10-CM | POA: Diagnosis not present

## 2015-02-15 DIAGNOSIS — N2581 Secondary hyperparathyroidism of renal origin: Secondary | ICD-10-CM | POA: Diagnosis not present

## 2015-02-15 DIAGNOSIS — Z23 Encounter for immunization: Secondary | ICD-10-CM | POA: Diagnosis not present

## 2015-02-15 DIAGNOSIS — D509 Iron deficiency anemia, unspecified: Secondary | ICD-10-CM | POA: Diagnosis not present

## 2015-02-15 DIAGNOSIS — N186 End stage renal disease: Secondary | ICD-10-CM | POA: Diagnosis not present

## 2015-02-18 DIAGNOSIS — N186 End stage renal disease: Secondary | ICD-10-CM | POA: Diagnosis not present

## 2015-02-18 DIAGNOSIS — N2581 Secondary hyperparathyroidism of renal origin: Secondary | ICD-10-CM | POA: Diagnosis not present

## 2015-02-18 DIAGNOSIS — D631 Anemia in chronic kidney disease: Secondary | ICD-10-CM | POA: Diagnosis not present

## 2015-02-18 DIAGNOSIS — D509 Iron deficiency anemia, unspecified: Secondary | ICD-10-CM | POA: Diagnosis not present

## 2015-02-18 DIAGNOSIS — Z23 Encounter for immunization: Secondary | ICD-10-CM | POA: Diagnosis not present

## 2015-02-18 DIAGNOSIS — Z992 Dependence on renal dialysis: Secondary | ICD-10-CM | POA: Diagnosis not present

## 2015-02-19 DIAGNOSIS — K432 Incisional hernia without obstruction or gangrene: Secondary | ICD-10-CM | POA: Diagnosis not present

## 2015-02-19 DIAGNOSIS — Z1389 Encounter for screening for other disorder: Secondary | ICD-10-CM | POA: Diagnosis not present

## 2015-02-19 DIAGNOSIS — Z992 Dependence on renal dialysis: Secondary | ICD-10-CM | POA: Diagnosis not present

## 2015-02-19 DIAGNOSIS — Z6820 Body mass index (BMI) 20.0-20.9, adult: Secondary | ICD-10-CM | POA: Diagnosis not present

## 2015-02-20 DIAGNOSIS — Z992 Dependence on renal dialysis: Secondary | ICD-10-CM | POA: Diagnosis not present

## 2015-02-20 DIAGNOSIS — D631 Anemia in chronic kidney disease: Secondary | ICD-10-CM | POA: Diagnosis not present

## 2015-02-20 DIAGNOSIS — D509 Iron deficiency anemia, unspecified: Secondary | ICD-10-CM | POA: Diagnosis not present

## 2015-02-20 DIAGNOSIS — Z23 Encounter for immunization: Secondary | ICD-10-CM | POA: Diagnosis not present

## 2015-02-20 DIAGNOSIS — N2581 Secondary hyperparathyroidism of renal origin: Secondary | ICD-10-CM | POA: Diagnosis not present

## 2015-02-20 DIAGNOSIS — N186 End stage renal disease: Secondary | ICD-10-CM | POA: Diagnosis not present

## 2015-02-22 DIAGNOSIS — D509 Iron deficiency anemia, unspecified: Secondary | ICD-10-CM | POA: Diagnosis not present

## 2015-02-22 DIAGNOSIS — Z992 Dependence on renal dialysis: Secondary | ICD-10-CM | POA: Diagnosis not present

## 2015-02-22 DIAGNOSIS — N2581 Secondary hyperparathyroidism of renal origin: Secondary | ICD-10-CM | POA: Diagnosis not present

## 2015-02-22 DIAGNOSIS — D631 Anemia in chronic kidney disease: Secondary | ICD-10-CM | POA: Diagnosis not present

## 2015-02-22 DIAGNOSIS — N186 End stage renal disease: Secondary | ICD-10-CM | POA: Diagnosis not present

## 2015-02-22 DIAGNOSIS — Z23 Encounter for immunization: Secondary | ICD-10-CM | POA: Diagnosis not present

## 2015-02-25 DIAGNOSIS — Z992 Dependence on renal dialysis: Secondary | ICD-10-CM | POA: Diagnosis not present

## 2015-02-25 DIAGNOSIS — N2581 Secondary hyperparathyroidism of renal origin: Secondary | ICD-10-CM | POA: Diagnosis not present

## 2015-02-25 DIAGNOSIS — D509 Iron deficiency anemia, unspecified: Secondary | ICD-10-CM | POA: Diagnosis not present

## 2015-02-25 DIAGNOSIS — D631 Anemia in chronic kidney disease: Secondary | ICD-10-CM | POA: Diagnosis not present

## 2015-02-25 DIAGNOSIS — Z23 Encounter for immunization: Secondary | ICD-10-CM | POA: Diagnosis not present

## 2015-02-25 DIAGNOSIS — N186 End stage renal disease: Secondary | ICD-10-CM | POA: Diagnosis not present

## 2015-02-27 DIAGNOSIS — Z23 Encounter for immunization: Secondary | ICD-10-CM | POA: Diagnosis not present

## 2015-02-27 DIAGNOSIS — N186 End stage renal disease: Secondary | ICD-10-CM | POA: Diagnosis not present

## 2015-02-27 DIAGNOSIS — D509 Iron deficiency anemia, unspecified: Secondary | ICD-10-CM | POA: Diagnosis not present

## 2015-02-27 DIAGNOSIS — N2581 Secondary hyperparathyroidism of renal origin: Secondary | ICD-10-CM | POA: Diagnosis not present

## 2015-02-27 DIAGNOSIS — D631 Anemia in chronic kidney disease: Secondary | ICD-10-CM | POA: Diagnosis not present

## 2015-02-27 DIAGNOSIS — Z992 Dependence on renal dialysis: Secondary | ICD-10-CM | POA: Diagnosis not present

## 2015-03-01 DIAGNOSIS — Z23 Encounter for immunization: Secondary | ICD-10-CM | POA: Diagnosis not present

## 2015-03-01 DIAGNOSIS — D509 Iron deficiency anemia, unspecified: Secondary | ICD-10-CM | POA: Diagnosis not present

## 2015-03-01 DIAGNOSIS — N186 End stage renal disease: Secondary | ICD-10-CM | POA: Diagnosis not present

## 2015-03-01 DIAGNOSIS — D631 Anemia in chronic kidney disease: Secondary | ICD-10-CM | POA: Diagnosis not present

## 2015-03-01 DIAGNOSIS — N2581 Secondary hyperparathyroidism of renal origin: Secondary | ICD-10-CM | POA: Diagnosis not present

## 2015-03-01 DIAGNOSIS — Z992 Dependence on renal dialysis: Secondary | ICD-10-CM | POA: Diagnosis not present

## 2015-03-02 DIAGNOSIS — N186 End stage renal disease: Secondary | ICD-10-CM | POA: Diagnosis not present

## 2015-03-02 DIAGNOSIS — Z992 Dependence on renal dialysis: Secondary | ICD-10-CM | POA: Diagnosis not present

## 2015-03-04 DIAGNOSIS — D631 Anemia in chronic kidney disease: Secondary | ICD-10-CM | POA: Diagnosis not present

## 2015-03-04 DIAGNOSIS — N186 End stage renal disease: Secondary | ICD-10-CM | POA: Diagnosis not present

## 2015-03-04 DIAGNOSIS — R110 Nausea: Secondary | ICD-10-CM | POA: Diagnosis not present

## 2015-03-04 DIAGNOSIS — Z992 Dependence on renal dialysis: Secondary | ICD-10-CM | POA: Diagnosis not present

## 2015-03-04 DIAGNOSIS — D509 Iron deficiency anemia, unspecified: Secondary | ICD-10-CM | POA: Diagnosis not present

## 2015-03-04 DIAGNOSIS — N2581 Secondary hyperparathyroidism of renal origin: Secondary | ICD-10-CM | POA: Diagnosis not present

## 2015-03-06 DIAGNOSIS — R110 Nausea: Secondary | ICD-10-CM | POA: Diagnosis not present

## 2015-03-06 DIAGNOSIS — D509 Iron deficiency anemia, unspecified: Secondary | ICD-10-CM | POA: Diagnosis not present

## 2015-03-06 DIAGNOSIS — N2581 Secondary hyperparathyroidism of renal origin: Secondary | ICD-10-CM | POA: Diagnosis not present

## 2015-03-06 DIAGNOSIS — Z992 Dependence on renal dialysis: Secondary | ICD-10-CM | POA: Diagnosis not present

## 2015-03-06 DIAGNOSIS — D631 Anemia in chronic kidney disease: Secondary | ICD-10-CM | POA: Diagnosis not present

## 2015-03-06 DIAGNOSIS — N186 End stage renal disease: Secondary | ICD-10-CM | POA: Diagnosis not present

## 2015-03-08 DIAGNOSIS — Z992 Dependence on renal dialysis: Secondary | ICD-10-CM | POA: Diagnosis not present

## 2015-03-08 DIAGNOSIS — N186 End stage renal disease: Secondary | ICD-10-CM | POA: Diagnosis not present

## 2015-03-08 DIAGNOSIS — D509 Iron deficiency anemia, unspecified: Secondary | ICD-10-CM | POA: Diagnosis not present

## 2015-03-08 DIAGNOSIS — R110 Nausea: Secondary | ICD-10-CM | POA: Diagnosis not present

## 2015-03-08 DIAGNOSIS — D631 Anemia in chronic kidney disease: Secondary | ICD-10-CM | POA: Diagnosis not present

## 2015-03-08 DIAGNOSIS — N2581 Secondary hyperparathyroidism of renal origin: Secondary | ICD-10-CM | POA: Diagnosis not present

## 2015-03-11 DIAGNOSIS — N186 End stage renal disease: Secondary | ICD-10-CM | POA: Diagnosis not present

## 2015-03-11 DIAGNOSIS — D631 Anemia in chronic kidney disease: Secondary | ICD-10-CM | POA: Diagnosis not present

## 2015-03-11 DIAGNOSIS — R110 Nausea: Secondary | ICD-10-CM | POA: Diagnosis not present

## 2015-03-11 DIAGNOSIS — N2581 Secondary hyperparathyroidism of renal origin: Secondary | ICD-10-CM | POA: Diagnosis not present

## 2015-03-11 DIAGNOSIS — D509 Iron deficiency anemia, unspecified: Secondary | ICD-10-CM | POA: Diagnosis not present

## 2015-03-11 DIAGNOSIS — Z992 Dependence on renal dialysis: Secondary | ICD-10-CM | POA: Diagnosis not present

## 2015-03-13 DIAGNOSIS — Z992 Dependence on renal dialysis: Secondary | ICD-10-CM | POA: Diagnosis not present

## 2015-03-13 DIAGNOSIS — D509 Iron deficiency anemia, unspecified: Secondary | ICD-10-CM | POA: Diagnosis not present

## 2015-03-13 DIAGNOSIS — N2581 Secondary hyperparathyroidism of renal origin: Secondary | ICD-10-CM | POA: Diagnosis not present

## 2015-03-13 DIAGNOSIS — N186 End stage renal disease: Secondary | ICD-10-CM | POA: Diagnosis not present

## 2015-03-13 DIAGNOSIS — R110 Nausea: Secondary | ICD-10-CM | POA: Diagnosis not present

## 2015-03-13 DIAGNOSIS — D631 Anemia in chronic kidney disease: Secondary | ICD-10-CM | POA: Diagnosis not present

## 2015-03-15 DIAGNOSIS — R110 Nausea: Secondary | ICD-10-CM | POA: Diagnosis not present

## 2015-03-15 DIAGNOSIS — Z992 Dependence on renal dialysis: Secondary | ICD-10-CM | POA: Diagnosis not present

## 2015-03-15 DIAGNOSIS — D509 Iron deficiency anemia, unspecified: Secondary | ICD-10-CM | POA: Diagnosis not present

## 2015-03-15 DIAGNOSIS — D631 Anemia in chronic kidney disease: Secondary | ICD-10-CM | POA: Diagnosis not present

## 2015-03-15 DIAGNOSIS — N2581 Secondary hyperparathyroidism of renal origin: Secondary | ICD-10-CM | POA: Diagnosis not present

## 2015-03-15 DIAGNOSIS — N186 End stage renal disease: Secondary | ICD-10-CM | POA: Diagnosis not present

## 2015-03-18 DIAGNOSIS — D509 Iron deficiency anemia, unspecified: Secondary | ICD-10-CM | POA: Diagnosis not present

## 2015-03-18 DIAGNOSIS — Z992 Dependence on renal dialysis: Secondary | ICD-10-CM | POA: Diagnosis not present

## 2015-03-18 DIAGNOSIS — N186 End stage renal disease: Secondary | ICD-10-CM | POA: Diagnosis not present

## 2015-03-18 DIAGNOSIS — R110 Nausea: Secondary | ICD-10-CM | POA: Diagnosis not present

## 2015-03-18 DIAGNOSIS — N2581 Secondary hyperparathyroidism of renal origin: Secondary | ICD-10-CM | POA: Diagnosis not present

## 2015-03-18 DIAGNOSIS — D631 Anemia in chronic kidney disease: Secondary | ICD-10-CM | POA: Diagnosis not present

## 2015-03-20 DIAGNOSIS — D631 Anemia in chronic kidney disease: Secondary | ICD-10-CM | POA: Diagnosis not present

## 2015-03-20 DIAGNOSIS — N2581 Secondary hyperparathyroidism of renal origin: Secondary | ICD-10-CM | POA: Diagnosis not present

## 2015-03-20 DIAGNOSIS — D509 Iron deficiency anemia, unspecified: Secondary | ICD-10-CM | POA: Diagnosis not present

## 2015-03-20 DIAGNOSIS — N186 End stage renal disease: Secondary | ICD-10-CM | POA: Diagnosis not present

## 2015-03-20 DIAGNOSIS — Z992 Dependence on renal dialysis: Secondary | ICD-10-CM | POA: Diagnosis not present

## 2015-03-20 DIAGNOSIS — R110 Nausea: Secondary | ICD-10-CM | POA: Diagnosis not present

## 2015-03-22 DIAGNOSIS — D509 Iron deficiency anemia, unspecified: Secondary | ICD-10-CM | POA: Diagnosis not present

## 2015-03-22 DIAGNOSIS — N2581 Secondary hyperparathyroidism of renal origin: Secondary | ICD-10-CM | POA: Diagnosis not present

## 2015-03-22 DIAGNOSIS — D631 Anemia in chronic kidney disease: Secondary | ICD-10-CM | POA: Diagnosis not present

## 2015-03-22 DIAGNOSIS — N186 End stage renal disease: Secondary | ICD-10-CM | POA: Diagnosis not present

## 2015-03-22 DIAGNOSIS — Z992 Dependence on renal dialysis: Secondary | ICD-10-CM | POA: Diagnosis not present

## 2015-03-22 DIAGNOSIS — R110 Nausea: Secondary | ICD-10-CM | POA: Diagnosis not present

## 2015-03-25 DIAGNOSIS — N2581 Secondary hyperparathyroidism of renal origin: Secondary | ICD-10-CM | POA: Diagnosis not present

## 2015-03-25 DIAGNOSIS — N186 End stage renal disease: Secondary | ICD-10-CM | POA: Diagnosis not present

## 2015-03-25 DIAGNOSIS — R110 Nausea: Secondary | ICD-10-CM | POA: Diagnosis not present

## 2015-03-25 DIAGNOSIS — D509 Iron deficiency anemia, unspecified: Secondary | ICD-10-CM | POA: Diagnosis not present

## 2015-03-25 DIAGNOSIS — D631 Anemia in chronic kidney disease: Secondary | ICD-10-CM | POA: Diagnosis not present

## 2015-03-25 DIAGNOSIS — Z992 Dependence on renal dialysis: Secondary | ICD-10-CM | POA: Diagnosis not present

## 2015-03-27 DIAGNOSIS — Z992 Dependence on renal dialysis: Secondary | ICD-10-CM | POA: Diagnosis not present

## 2015-03-27 DIAGNOSIS — D509 Iron deficiency anemia, unspecified: Secondary | ICD-10-CM | POA: Diagnosis not present

## 2015-03-27 DIAGNOSIS — D631 Anemia in chronic kidney disease: Secondary | ICD-10-CM | POA: Diagnosis not present

## 2015-03-27 DIAGNOSIS — N2581 Secondary hyperparathyroidism of renal origin: Secondary | ICD-10-CM | POA: Diagnosis not present

## 2015-03-27 DIAGNOSIS — N186 End stage renal disease: Secondary | ICD-10-CM | POA: Diagnosis not present

## 2015-03-27 DIAGNOSIS — R110 Nausea: Secondary | ICD-10-CM | POA: Diagnosis not present

## 2015-03-29 DIAGNOSIS — N2581 Secondary hyperparathyroidism of renal origin: Secondary | ICD-10-CM | POA: Diagnosis not present

## 2015-03-29 DIAGNOSIS — R110 Nausea: Secondary | ICD-10-CM | POA: Diagnosis not present

## 2015-03-29 DIAGNOSIS — D509 Iron deficiency anemia, unspecified: Secondary | ICD-10-CM | POA: Diagnosis not present

## 2015-03-29 DIAGNOSIS — N186 End stage renal disease: Secondary | ICD-10-CM | POA: Diagnosis not present

## 2015-03-29 DIAGNOSIS — Z992 Dependence on renal dialysis: Secondary | ICD-10-CM | POA: Diagnosis not present

## 2015-03-29 DIAGNOSIS — D631 Anemia in chronic kidney disease: Secondary | ICD-10-CM | POA: Diagnosis not present

## 2015-04-01 DIAGNOSIS — D631 Anemia in chronic kidney disease: Secondary | ICD-10-CM | POA: Diagnosis not present

## 2015-04-01 DIAGNOSIS — R110 Nausea: Secondary | ICD-10-CM | POA: Diagnosis not present

## 2015-04-01 DIAGNOSIS — N186 End stage renal disease: Secondary | ICD-10-CM | POA: Diagnosis not present

## 2015-04-01 DIAGNOSIS — D509 Iron deficiency anemia, unspecified: Secondary | ICD-10-CM | POA: Diagnosis not present

## 2015-04-01 DIAGNOSIS — Z992 Dependence on renal dialysis: Secondary | ICD-10-CM | POA: Diagnosis not present

## 2015-04-01 DIAGNOSIS — N2581 Secondary hyperparathyroidism of renal origin: Secondary | ICD-10-CM | POA: Diagnosis not present

## 2015-04-02 DIAGNOSIS — Z992 Dependence on renal dialysis: Secondary | ICD-10-CM | POA: Diagnosis not present

## 2015-04-02 DIAGNOSIS — N186 End stage renal disease: Secondary | ICD-10-CM | POA: Diagnosis not present

## 2015-04-03 DIAGNOSIS — N2581 Secondary hyperparathyroidism of renal origin: Secondary | ICD-10-CM | POA: Diagnosis not present

## 2015-04-03 DIAGNOSIS — N186 End stage renal disease: Secondary | ICD-10-CM | POA: Diagnosis not present

## 2015-04-03 DIAGNOSIS — Z23 Encounter for immunization: Secondary | ICD-10-CM | POA: Diagnosis not present

## 2015-04-03 DIAGNOSIS — D509 Iron deficiency anemia, unspecified: Secondary | ICD-10-CM | POA: Diagnosis not present

## 2015-04-03 DIAGNOSIS — D631 Anemia in chronic kidney disease: Secondary | ICD-10-CM | POA: Diagnosis not present

## 2015-04-03 DIAGNOSIS — Z992 Dependence on renal dialysis: Secondary | ICD-10-CM | POA: Diagnosis not present

## 2015-04-05 DIAGNOSIS — Z23 Encounter for immunization: Secondary | ICD-10-CM | POA: Diagnosis not present

## 2015-04-05 DIAGNOSIS — D509 Iron deficiency anemia, unspecified: Secondary | ICD-10-CM | POA: Diagnosis not present

## 2015-04-05 DIAGNOSIS — Z992 Dependence on renal dialysis: Secondary | ICD-10-CM | POA: Diagnosis not present

## 2015-04-05 DIAGNOSIS — N186 End stage renal disease: Secondary | ICD-10-CM | POA: Diagnosis not present

## 2015-04-05 DIAGNOSIS — D631 Anemia in chronic kidney disease: Secondary | ICD-10-CM | POA: Diagnosis not present

## 2015-04-05 DIAGNOSIS — N2581 Secondary hyperparathyroidism of renal origin: Secondary | ICD-10-CM | POA: Diagnosis not present

## 2015-04-08 DIAGNOSIS — N2581 Secondary hyperparathyroidism of renal origin: Secondary | ICD-10-CM | POA: Diagnosis not present

## 2015-04-08 DIAGNOSIS — N186 End stage renal disease: Secondary | ICD-10-CM | POA: Diagnosis not present

## 2015-04-08 DIAGNOSIS — D509 Iron deficiency anemia, unspecified: Secondary | ICD-10-CM | POA: Diagnosis not present

## 2015-04-08 DIAGNOSIS — Z23 Encounter for immunization: Secondary | ICD-10-CM | POA: Diagnosis not present

## 2015-04-08 DIAGNOSIS — Z992 Dependence on renal dialysis: Secondary | ICD-10-CM | POA: Diagnosis not present

## 2015-04-08 DIAGNOSIS — D631 Anemia in chronic kidney disease: Secondary | ICD-10-CM | POA: Diagnosis not present

## 2015-04-10 DIAGNOSIS — D509 Iron deficiency anemia, unspecified: Secondary | ICD-10-CM | POA: Diagnosis not present

## 2015-04-10 DIAGNOSIS — Z23 Encounter for immunization: Secondary | ICD-10-CM | POA: Diagnosis not present

## 2015-04-10 DIAGNOSIS — D631 Anemia in chronic kidney disease: Secondary | ICD-10-CM | POA: Diagnosis not present

## 2015-04-10 DIAGNOSIS — N2581 Secondary hyperparathyroidism of renal origin: Secondary | ICD-10-CM | POA: Diagnosis not present

## 2015-04-10 DIAGNOSIS — N186 End stage renal disease: Secondary | ICD-10-CM | POA: Diagnosis not present

## 2015-04-10 DIAGNOSIS — Z992 Dependence on renal dialysis: Secondary | ICD-10-CM | POA: Diagnosis not present

## 2015-04-12 DIAGNOSIS — N186 End stage renal disease: Secondary | ICD-10-CM | POA: Diagnosis not present

## 2015-04-12 DIAGNOSIS — N2581 Secondary hyperparathyroidism of renal origin: Secondary | ICD-10-CM | POA: Diagnosis not present

## 2015-04-12 DIAGNOSIS — Z992 Dependence on renal dialysis: Secondary | ICD-10-CM | POA: Diagnosis not present

## 2015-04-12 DIAGNOSIS — D509 Iron deficiency anemia, unspecified: Secondary | ICD-10-CM | POA: Diagnosis not present

## 2015-04-12 DIAGNOSIS — Z23 Encounter for immunization: Secondary | ICD-10-CM | POA: Diagnosis not present

## 2015-04-12 DIAGNOSIS — D631 Anemia in chronic kidney disease: Secondary | ICD-10-CM | POA: Diagnosis not present

## 2015-04-14 ENCOUNTER — Inpatient Hospital Stay (HOSPITAL_COMMUNITY)
Admission: EM | Admit: 2015-04-14 | Discharge: 2015-04-17 | DRG: 682 | Disposition: A | Discharge status: Home / Self Care | Payer: Medicare Other | Attending: Family Medicine | Admitting: Family Medicine

## 2015-04-14 ENCOUNTER — Emergency Department (HOSPITAL_COMMUNITY): Payer: Medicare Other

## 2015-04-14 ENCOUNTER — Encounter (HOSPITAL_COMMUNITY): Payer: Self-pay | Admitting: Emergency Medicine

## 2015-04-14 DIAGNOSIS — Z8249 Family history of ischemic heart disease and other diseases of the circulatory system: Secondary | ICD-10-CM | POA: Diagnosis not present

## 2015-04-14 DIAGNOSIS — I120 Hypertensive chronic kidney disease with stage 5 chronic kidney disease or end stage renal disease: Secondary | ICD-10-CM | POA: Diagnosis not present

## 2015-04-14 DIAGNOSIS — N390 Urinary tract infection, site not specified: Secondary | ICD-10-CM

## 2015-04-14 DIAGNOSIS — G9340 Encephalopathy, unspecified: Secondary | ICD-10-CM | POA: Diagnosis present

## 2015-04-14 DIAGNOSIS — Z91128 Patient's intentional underdosing of medication regimen for other reason: Secondary | ICD-10-CM | POA: Diagnosis present

## 2015-04-14 DIAGNOSIS — K219 Gastro-esophageal reflux disease without esophagitis: Secondary | ICD-10-CM | POA: Diagnosis present

## 2015-04-14 DIAGNOSIS — R112 Nausea with vomiting, unspecified: Secondary | ICD-10-CM | POA: Diagnosis present

## 2015-04-14 DIAGNOSIS — Z833 Family history of diabetes mellitus: Secondary | ICD-10-CM | POA: Diagnosis not present

## 2015-04-14 DIAGNOSIS — Z8542 Personal history of malignant neoplasm of other parts of uterus: Secondary | ICD-10-CM | POA: Diagnosis not present

## 2015-04-14 DIAGNOSIS — D72819 Decreased white blood cell count, unspecified: Secondary | ICD-10-CM | POA: Diagnosis present

## 2015-04-14 DIAGNOSIS — I159 Secondary hypertension, unspecified: Secondary | ICD-10-CM

## 2015-04-14 DIAGNOSIS — Z992 Dependence on renal dialysis: Secondary | ICD-10-CM

## 2015-04-14 DIAGNOSIS — F0390 Unspecified dementia without behavioral disturbance: Secondary | ICD-10-CM | POA: Diagnosis present

## 2015-04-14 DIAGNOSIS — N186 End stage renal disease: Secondary | ICD-10-CM | POA: Diagnosis not present

## 2015-04-14 DIAGNOSIS — R42 Dizziness and giddiness: Secondary | ICD-10-CM

## 2015-04-14 DIAGNOSIS — Z809 Family history of malignant neoplasm, unspecified: Secondary | ICD-10-CM | POA: Diagnosis not present

## 2015-04-14 DIAGNOSIS — R4182 Altered mental status, unspecified: Secondary | ICD-10-CM | POA: Diagnosis not present

## 2015-04-14 DIAGNOSIS — E039 Hypothyroidism, unspecified: Secondary | ICD-10-CM | POA: Diagnosis present

## 2015-04-14 DIAGNOSIS — D631 Anemia in chronic kidney disease: Secondary | ICD-10-CM | POA: Diagnosis present

## 2015-04-14 DIAGNOSIS — R410 Disorientation, unspecified: Secondary | ICD-10-CM | POA: Diagnosis not present

## 2015-04-14 DIAGNOSIS — R531 Weakness: Secondary | ICD-10-CM | POA: Diagnosis not present

## 2015-04-14 DIAGNOSIS — E871 Hypo-osmolality and hyponatremia: Secondary | ICD-10-CM | POA: Diagnosis present

## 2015-04-14 DIAGNOSIS — I10 Essential (primary) hypertension: Secondary | ICD-10-CM | POA: Diagnosis not present

## 2015-04-14 DIAGNOSIS — I951 Orthostatic hypotension: Secondary | ICD-10-CM | POA: Diagnosis present

## 2015-04-14 LAB — CBC WITH DIFFERENTIAL/PLATELET
BASOS ABS: 0 10*3/uL (ref 0.0–0.1)
Basophils Relative: 1 % (ref 0–1)
EOS PCT: 2 % (ref 0–5)
Eosinophils Absolute: 0.1 10*3/uL (ref 0.0–0.7)
HEMATOCRIT: 40.5 % (ref 36.0–46.0)
Hemoglobin: 12.4 g/dL (ref 12.0–15.0)
LYMPHS ABS: 1.2 10*3/uL (ref 0.7–4.0)
LYMPHS PCT: 32 % (ref 12–46)
MCH: 23.6 pg — AB (ref 26.0–34.0)
MCHC: 30.6 g/dL (ref 30.0–36.0)
MCV: 77 fL — AB (ref 78.0–100.0)
MONO ABS: 0.4 10*3/uL (ref 0.1–1.0)
Monocytes Relative: 10 % (ref 3–12)
NEUTROS ABS: 2.2 10*3/uL (ref 1.7–7.7)
Neutrophils Relative %: 55 % (ref 43–77)
PLATELETS: 192 10*3/uL (ref 150–400)
RBC: 5.26 MIL/uL — AB (ref 3.87–5.11)
RDW: 20.8 % — AB (ref 11.5–15.5)
WBC: 3.9 10*3/uL — ABNORMAL LOW (ref 4.0–10.5)

## 2015-04-14 LAB — URINALYSIS, ROUTINE W REFLEX MICROSCOPIC
Bilirubin Urine: NEGATIVE
Glucose, UA: 100 mg/dL — AB
Ketones, ur: NEGATIVE mg/dL
Nitrite: NEGATIVE
Protein, ur: >300 mg/dL — AB
Specific Gravity, Urine: 1.02 (ref 1.005–1.030)
Urobilinogen, UA: 0.2 mg/dL (ref 0.0–1.0)
pH: 8.5 — ABNORMAL HIGH (ref 5.0–8.0)

## 2015-04-14 LAB — LACTIC ACID, PLASMA
Lactic Acid, Venous: 1 mmol/L (ref 0.5–2.0)
Lactic Acid, Venous: 0.6 mmol/L (ref 0.5–2.0)

## 2015-04-14 LAB — COMPREHENSIVE METABOLIC PANEL
ALT: 9 U/L — ABNORMAL LOW (ref 14–54)
AST: 14 U/L — ABNORMAL LOW (ref 15–41)
Albumin: 3.7 g/dL (ref 3.5–5.0)
Alkaline Phosphatase: 41 U/L (ref 38–126)
Anion gap: 8 (ref 5–15)
BUN: 30 mg/dL — ABNORMAL HIGH (ref 6–20)
CO2: 27 mmol/L (ref 22–32)
Calcium: 9 mg/dL (ref 8.9–10.3)
Chloride: 100 mmol/L — ABNORMAL LOW (ref 101–111)
Creatinine, Ser: 7.56 mg/dL — ABNORMAL HIGH (ref 0.44–1.00)
GFR calc Af Amer: 6 mL/min — ABNORMAL LOW (ref >60)
GFR calc non Af Amer: 5 mL/min — ABNORMAL LOW (ref >60)
Glucose, Bld: 89 mg/dL (ref 65–99)
Potassium: 4 mmol/L (ref 3.5–5.1)
Sodium: 135 mmol/L (ref 135–145)
Total Bilirubin: 0.6 mg/dL (ref 0.3–1.2)
Total Protein: 7.3 g/dL (ref 6.5–8.1)

## 2015-04-14 LAB — TROPONIN I: Troponin I: <0.03 ng/mL (ref <0.031)

## 2015-04-14 LAB — URINE MICROSCOPIC-ADD ON

## 2015-04-14 LAB — LIPASE, BLOOD: LIPASE: 32 U/L (ref 22–51)

## 2015-04-14 MED ORDER — HYDRALAZINE HCL 25 MG PO TABS
100.0000 mg | ORAL_TABLET | Freq: Once | ORAL | Status: AC
  Administered 2015-04-14: 100 mg via ORAL
  Filled 2015-04-14 (×2): qty 4

## 2015-04-14 MED ORDER — ONDANSETRON HCL 4 MG/2ML IJ SOLN
4.0000 mg | Freq: Once | INTRAMUSCULAR | Status: AC
  Administered 2015-04-14: 4 mg via INTRAMUSCULAR
  Filled 2015-04-14: qty 2

## 2015-04-14 MED ORDER — HYDRALAZINE HCL 20 MG/ML IJ SOLN: 100.0000 mg | Freq: Once | INTRAMUSCULAR | Status: DC

## 2015-04-14 MED ORDER — CLONIDINE HCL 0.2 MG PO TABS
0.2000 mg | ORAL_TABLET | Freq: Once | ORAL | Status: AC
Start: 2015-04-14 — End: 2015-04-14
  Administered 2015-04-14: 0.2 mg via ORAL
  Filled 2015-04-14: qty 1

## 2015-04-14 MED ORDER — HYDRALAZINE HCL 20 MG/ML IJ SOLN
20.0000 mg | Freq: Once | INTRAMUSCULAR | Status: AC
  Administered 2015-04-14: 20 mg via INTRAVENOUS
  Filled 2015-04-14: qty 1

## 2015-04-14 MED ORDER — MECLIZINE HCL 12.5 MG PO TABS
25.0000 mg | ORAL_TABLET | Freq: Once | ORAL | Status: AC
  Administered 2015-04-14: 25 mg via ORAL
  Filled 2015-04-14: qty 2

## 2015-04-14 MED ORDER — CLONIDINE HCL 0.2 MG PO TABS
0.2000 mg | ORAL_TABLET | Freq: Once | ORAL | Status: AC
  Administered 2015-04-14: 0.2 mg via ORAL
  Filled 2015-04-14: qty 1

## 2015-04-14 MED ORDER — HYDRALAZINE HCL 20 MG/ML IJ SOLN
10.0000 mg | Freq: Once | INTRAMUSCULAR | Status: AC
  Administered 2015-04-14: 10 mg via INTRAVENOUS
  Filled 2015-04-14: qty 1

## 2015-04-14 NOTE — ED Notes (Signed)
Report given to [facility] on 300

## 2015-04-14 NOTE — ED Notes (Signed)
Pt assisted to [facility] and back to bed; pt states she feels dizzy and EDP is suppose to order something for dizziness; waiting for orders

## 2015-04-14 NOTE — H&P (Signed)
[facility] History and Physical  Sonya [name] [id number] DOB: 1951-07-26 DOA: 04/14/2015  Referring physician: Dr. Alfonse Spruce PCP: [name], MD   Chief Complaint: Nausea and vomiting  HPI: Sonya [name] is a 64 y.o. female with past medical history of ESRD, malignant hypertension and anemia came into the hospital complaining about nausea and vomiting. Patient mentioned her symptoms started on Saturday just after dialysis, she vomited about 2-3 times and did that again on Sunday. No vomiting today, no abdominal pain but she felt nauseous this morning when she trying to take her medications so she did not take him. Came to the hospital when the nausea did not go away. She denies any fever, chills, she denies any changes in her urine odur, burning or color. In the ED initial blood pressure was 235/108, improved slightly after medications. Urinalysis showing 21-50 pus cells, patient admitted to the hospital to control blood pressure and for possible UTI. Patient has very odd demeanor, she did not want to keep eye contact with me, and started blaming every health care professional she knew about her uncontrolled HTN and ESRD.  Review of Systems:  Constitutional: negative for anorexia, fevers and sweats Eyes: negative for irritation, redness and visual disturbance Ears, nose, mouth, throat, and face: negative for earaches, epistaxis, nasal congestion and sore throat Respiratory: negative for cough, dyspnea on exertion, sputum and wheezing Cardiovascular: negative for chest pain, dyspnea, lower extremity edema, orthopnea, palpitations and syncope Gastrointestinal: negative for abdominal pain, constipation, diarrhea, melena, nausea and vomiting Genitourinary:negative for dysuria, frequency and hematuria Hematologic/lymphatic: negative for bleeding, easy bruising and lymphadenopathy Musculoskeletal:negative for arthralgias, muscle weakness and stiff joints Neurological: negative for  coordination problems, gait problems, headaches and weakness Endocrine: negative for diabetic symptoms including polydipsia, polyuria and weight loss Allergic/Immunologic: negative for anaphylaxis, hay fever and urticaria  Past Medical History  Diagnosis Date  . HTN (hypertension)   . Hypothyroidism   . Uterine cancer 2012  . Anemia   . CKD (chronic kidney disease)   . Dysphagia   . Hyperparathyroidism due to renal insufficiency   . GERD (gastroesophageal reflux disease)   . Diverticulitis   . Vertical diplopia    Past Surgical History  Procedure Laterality Date  . Cholecystectomy    . Cesarean section    . Laparoscopic total hysterectomy    . Colonoscopy  2000    [facility], [facility]  . Colonoscopy  2003 NUR BRBPR D50 V6    [facility], [facility]  . Abdominal hysterectomy    . Nasal septum surgery    . Esophagogastroduodenoscopy (egd) with esophageal dilation N/A 06/08/2013    Dr. Fields:Stricture at the gastroesophageal junction/MILD NON-erosive gastritis (inflammation) was found in the gastric antrum; multiple biopsies/NO SOURCE FOR ANEMIA IDETIFIED-MOST LIKELY DUE TO ANEMIA OF CHRONIC DISEASE  . Colonoscopy N/A 09/04/2013    Procedure: COLONOSCOPY;  Surgeon: Danie Binder, MD;  [facility];  Service: Endoscopy;  Laterality: N/A;  10:30AM-moved to 9:25 [name]  . Appendectomy    . Bascilic vein transposition Right 09/17/2013    Procedure: BRACHIAL VEIN TRANSPOSITION;  Surgeon: Conrad Pon[place]a, MD;  Location: [place];  Service: Vascular;  Laterality: Right;  . Insertion of dialysis catheter Left 09/17/2013    Procedure: INSERTION OF DIALYSIS CATHETER;  Surgeon: Conrad Cale, MD;  Location: [place];  Service: Vascular;  Laterality: Left;  . Bascilic vein transposition Right 10/29/2013    Procedure: RIGHT SECOND STAGE BRACHIAL VEIN TRANSPOSITION;  Surgeon:  Conrad [place], MD;  Location: [place];  Service: Vascular;  Laterality: Right;  . Givens capsule study N/A 12/03/2013    Procedure:  GIVENS CAPSULE STUDY;  Surgeon: Danie Binder, MD;  Location: [facility];  Service: Endoscopy;  Laterality: N/A;  7:30   Social History:   reports that she has never smoked. She has never used smokeless tobacco. She reports that she does not drink alcohol or use illicit drugs.  Allergies  Allergen Reactions  . Cephalexin Itching    Family History  Problem Relation Age of Onset  . Colon cancer Neg Hx   . Cancer Mother   . Diabetes Mother   . Hypertension Mother   . Diabetes Father   . Hypertension Father   . Hypertension Sister      Prior to Admission medications   Medication Sig Start Date End Date Taking? Authorizing Provider  allopurinol (ZYLOPRIM) 300 MG tablet Take 300 mg by mouth daily as needed (for gout).    Yes Historical Provider, MD  cloNIDine (CATAPRES) 0.2 MG tablet Take 1 tablet by mouth 3 (three) times daily. 11/22/14  Yes Historical Provider, MD  docusate sodium (COLACE) 100 MG capsule Take 100 mg by mouth 2 (two) times daily.   Yes Historical Provider, MD  ferrous sulfate dried (SLOW FE) 160 (50 FE) MG TBCR SR tablet Take 640 mg by mouth daily.   Yes Historical Provider, MD  hydrALAZINE (APRESOLINE) 100 MG tablet Take 100 mg by mouth 3 (three) times daily. 04/09/15  Yes Historical Provider, MD  labetalol (NORMODYNE) 300 MG tablet Take 1 tablet (300 mg total) by mouth 3 (three) times daily. 08/20/13  Yes Kinnie Feil, MD  levothyroxine (SYNTHROID, LEVOTHROID) 50 MCG tablet Take 1 tablet (50 mcg total) by mouth daily. 08/20/13  Yes Kinnie Feil, MD  lidocaine-prilocaine (EMLA) cream Apply 1 application topically as needed (on Dialysis days Tues,Thurs.,Sat.).   Yes Historical Provider, MD  meclizine (ANTIVERT) 25 MG tablet Take 1 tablet by mouth 4 (four) times daily as needed. dizziness 11/22/14  Yes Historical Provider, MD  NIFEdipine (PROCARDIA XL/ADALAT-CC) 90 MG 24 hr tablet Take 90 mg by mouth daily. 04/10/15  Yes Historical Provider, MD  omeprazole (PRILOSEC)  20 MG capsule Take 1 capsule (20 mg total) by mouth daily. 07/29/14  Yes Orvil Feil, NP  sevelamer carbonate (RENVELA) 800 MG tablet Take 800-2,400 mg by mouth 3 (three) times daily with meals. Patient takes 1 tablet with snack and 3 tablets with meal   Yes Historical Provider, MD  sodium bicarbonate 650 MG tablet Take 1 tablet (650 mg total) by mouth 2 (two) times daily. Patient taking differently: Take 650 mg by mouth daily.  06/12/13  Yes Kathie Dike, MD  hydrALAZINE (APRESOLINE) 50 MG tablet Take 1 tablet (50 mg total) by mouth every 8 (eight) hours. Patient not taking: Reported on 04/14/2015 10/22/14   Reyne Dumas, MD  Linaclotide (LINZESS) 290 MCG CAPS capsule 1 PO 30 MINS PRIOR TO BREAKFAST. IF O SATISFACTORY BM AFTER 2 WEEKS, TAKE WITH BREAKFAST. Patient not taking: Reported on 04/14/2015 05/01/14   Danie Binder, MD  Nutritional Supplements (FEEDING SUPPLEMENT, NEPRO CARB STEADY,) LIQD Take 237 mLs by mouth as needed (missed meal during dialysis.). Patient not taking: Reported on 04/14/2015 10/22/14   Reyne Dumas, MD   Physical Exam: Filed Vitals:   04/14/15 2200  BP: 175/90  Pulse:   Temp:   Resp: 19   Constitutional: Oriented to person, place, and  time. Well-developed and well-nourished. Cooperative.  Head: Normocephalic and atraumatic.  Nose: Nose normal.  Mouth/Throat: Uvula is midline, oropharynx is clear and moist and mucous membranes are normal.  Eyes: Conjunctivae and EOM are normal. Pupils are equal, round, and reactive to light.  Neck: Trachea normal and normal range of motion. Neck supple.  Cardiovascular: Normal rate, regular rhythm, S1 normal, S2 normal, normal heart sounds and intact distal pulses.   Pulmonary/Chest: Effort normal and breath sounds normal.  Abdominal: Soft. Bowel sounds are normal. There is no hepatosplenomegaly. There is no tenderness.  Musculoskeletal: Normal range of motion.  Neurological: Alert and oriented to person, place, and time. Has  normal strength. No cranial nerve deficit or sensory deficit.  Skin: Skin is warm, dry and intact.  Psychiatric: Has a normal mood and affect. Speech is normal and behavior is normal.   Labs on Admission:  Basic Metabolic Panel:  Recent Labs Lab 04/14/15 1414  NA 135  K 4.0  CL 100*  CO2 27  GLUCOSE 89  BUN 30*  CREATININE 7.56*  CALCIUM 9.0   Liver Function Tests:  Recent Labs Lab 04/14/15 1414  AST 14*  ALT 9*  ALKPHOS 41  BILITOT 0.6  PROT 7.3  ALBUMIN 3.7    Recent Labs Lab 04/14/15 1414  LIPASE 32   No results for input(s): AMMONIA in the last 168 hours. CBC:  Recent Labs Lab 04/14/15 1414  WBC 3.9*  NEUTROABS 2.2  HGB 12.4  HCT 40.5  MCV 77.0*  PLT 192   Cardiac Enzymes:  Recent Labs Lab 04/14/15 1414  TROPONINI <0.03    BNP (last 3 results)  Recent Labs  10/14/14 1608  BNP 1512.0*    ProBNP (last 3 results) No results for input(s): PROBNP in the last 8760 hours.  CBG: No results for input(s): GLUCAP in the last 168 hours.  Radiological Exams on Admission: Dg Abd Acute W/chest  04/14/2015   CLINICAL DATA:  Dialysis patient. Became nauseated and vomited during dialysis.  EXAM: DG ABDOMEN ACUTE W/ 1V CHEST  COMPARISON:  10/22/2014  FINDINGS: The heart is enlarged. There is interstitial pulmonary edema. Small bilateral pleural effusions are identified. Bowel gas pattern is nonobstructive. There is no free intraperitoneal air beneath the diaphragm. Surgical clips are noted right upper quadrant of the abdomen.  IMPRESSION: 1. Cardiomegaly and interstitial edema. 2. Bilateral pleural effusions. 3. Nonobstructed bowel gas pattern.   Electronically Signed   By: Nolon Nations M.D.   On: 04/14/2015 15:15    EKG: Independently reviewed. Sinus tachycardia heart rate 100  Assessment/Plan Principal Problem:   UTI (lower urinary tract infection) Active Problems:   End stage renal disease   Dizziness   Uncontrolled hypertension    Orthostatic hypotension   Nausea and vomiting    Nausea and vomiting -Patient reported nausea and vomiting for the past 2 days, luckily she did not vomit today. -She did not take her medications for today because of nausea. -Was able to eat crackers in the ED, interestingly enough patient usually has no problems with food and reported stomach burning and nausea/vomiting with medications. -Likely secondary to the UTI, not to the high BP as it probably happened because patient did not take her medications. -Treated with symptomatically with antiemetics.  Accelerated hypertension -Blood pressure was 235/108, patient reported that she did not take her medications this morning. -She takes clonidine, this can cause rebound hypertension if doses are skipped. -Restarted back on her home medications, patient will have dialysis  in the morning probably will help as well.  UTI -Possible UTI, patient urinalysis showed few bacteria, 21-50 WBC and positive leukocyte esterase. -Patient did not have significant LUTSs in any way will start her on Rocephin. -Adjust antibiotics according to the culture results.  ESRD -Gets dialysis on Tuesday, Thursday and Saturday. Will obtain nephrology consultation the morning for routine dialysis.   Code Status: Full code Family Communication: Seen with husband and daughter at bedside Disposition Plan: Inpatient, telemetry  Time spent: 70 minutes  [facility] A, MD [facility] Pager [phone]

## 2015-04-14 NOTE — Discharge Instructions (Signed)
Hypertension Hypertension is another name for high blood pressure. High blood pressure forces your heart to work harder to pump blood. A blood pressure reading has two numbers, which includes a higher number over a lower number (example: 110/72). HOME CARE   Have your blood pressure rechecked by your doctor.  Only take medicine as told by your doctor. Follow the directions carefully. The medicine does not work as well if you skip doses. Skipping doses also puts you at risk for problems.  Do not smoke.  Monitor your blood pressure at home as told by your doctor. GET HELP IF:  You think you are having a reaction to the medicine you are taking.  You have repeat headaches or feel dizzy.  You have puffiness (swelling) in your ankles.  You have trouble with your vision. GET HELP RIGHT AWAY IF:   You get a very bad headache and are confused.  You feel weak, numb, or faint.  You get chest or belly (abdominal) pain.  You throw up (vomit).  You cannot breathe very well. MAKE SURE YOU:   Understand these instructions.  Will watch your condition.  Will get help right away if you are not doing well or get worse. Document Released: 01/05/2008 Document Revised: 07/24/2013 Document Reviewed: 05/11/2013 [facility] Patient Information 2015 [place], [place]. This information is not intended to replace advice given to you by your health care provider. Make sure you discuss any questions you have with your health care provider.  Nausea and Vomiting Nausea is a sick feeling that often comes before throwing up (vomiting). Vomiting is a reflex where stomach contents come out of your mouth. Vomiting can cause severe loss of body fluids (dehydration). Children and elderly adults can become dehydrated quickly, especially if they also have diarrhea. Nausea and vomiting are symptoms of a condition or disease. It is important to find the cause of your symptoms. CAUSES   Direct irritation of the stomach  lining. This irritation can result from increased acid production (gastroesophageal reflux disease), infection, food poisoning, taking certain medicines (such as nonsteroidal anti-inflammatory drugs), alcohol use, or tobacco use.  Signals from the brain.These signals could be caused by a headache, heat exposure, an inner ear disturbance, increased pressure in the brain from injury, infection, a tumor, or a concussion, pain, emotional stimulus, or metabolic problems.  An obstruction in the gastrointestinal tract (bowel obstruction).  Illnesses such as diabetes, hepatitis, gallbladder problems, appendicitis, kidney problems, cancer, sepsis, atypical symptoms of a heart attack, or eating disorders.  Medical treatments such as chemotherapy and radiation.  Receiving medicine that makes you sleep (general anesthetic) during surgery. DIAGNOSIS Your caregiver may ask for tests to be done if the problems do not improve after a few days. Tests may also be done if symptoms are severe or if the reason for the nausea and vomiting is not clear. Tests may include:  Urine tests.  Blood tests.  Stool tests.  Cultures (to look for evidence of infection).  X-rays or other imaging studies. Test results can help your caregiver make decisions about treatment or the need for additional tests. TREATMENT You need to stay well hydrated. Drink frequently but in small amounts.You may wish to drink water, sports drinks, clear broth, or eat frozen ice pops or gelatin dessert to help stay hydrated.When you eat, eating slowly may help prevent nausea.There are also some antinausea medicines that may help prevent nausea. HOME CARE INSTRUCTIONS   Take all medicine as directed by your caregiver.  If you  do not have an appetite, do not force yourself to eat. However, you must continue to drink fluids.  If you have an appetite, eat a normal diet unless your caregiver tells you differently.  Eat a variety of complex  carbohydrates (rice, wheat, potatoes, bread), lean meats, yogurt, fruits, and vegetables.  Avoid high-fat foods because they are more difficult to digest.  Drink enough water and fluids to keep your urine clear or pale yellow.  If you are dehydrated, ask your caregiver for specific rehydration instructions. Signs of dehydration may include:  Severe thirst.  Dry lips and mouth.  Dizziness.  Dark urine.  Decreasing urine frequency and amount.  Confusion.  Rapid breathing or pulse. SEEK IMMEDIATE MEDICAL CARE IF:   You have blood or brown flecks (like coffee grounds) in your vomit.  You have black or bloody stools.  You have a severe headache or stiff neck.  You are confused.  You have severe abdominal pain.  You have chest pain or trouble breathing.  You do not urinate at least once every 8 hours.  You develop cold or clammy skin.  You continue to vomit for longer than 24 to 48 hours.  You have a fever. MAKE SURE YOU:   Understand these instructions.  Will watch your condition.  Will get help right away if you are not doing well or get worse. Document Released: 07/19/2005 Document Revised: 10/11/2011 Document Reviewed: 12/16/2010 [facility] Patient Information 2015 [place], [place]. This information is not intended to replace advice given to you by your health care provider. Make sure you discuss any questions you have with your health care provider.

## 2015-04-14 NOTE — ED Notes (Signed)
Pt is a dialysis pt and during dialysis she became nauseated and vomited , has been sick since then , denies diarrhea and denies problems with urination

## 2015-04-15 ENCOUNTER — Encounter (HOSPITAL_COMMUNITY): Payer: Self-pay | Admitting: *Deleted

## 2015-04-15 LAB — BASIC METABOLIC PANEL
ANION GAP: 9 (ref 5–15)
BUN: 38 mg/dL — ABNORMAL HIGH (ref 6–20)
CHLORIDE: 100 mmol/L — AB (ref 101–111)
CO2: 26 mmol/L (ref 22–32)
Calcium: 8.8 mg/dL — ABNORMAL LOW (ref 8.9–10.3)
Creatinine, Ser: 9.32 mg/dL — ABNORMAL HIGH (ref 0.44–1.00)
GFR, EST AFRICAN AMERICAN: 5 mL/min — AB (ref >60)
GFR, EST NON AFRICAN AMERICAN: 4 mL/min — AB (ref >60)
Glucose, Bld: 129 mg/dL — ABNORMAL HIGH (ref 65–99)
POTASSIUM: 4.1 mmol/L (ref 3.5–5.1)
SODIUM: 135 mmol/L (ref 135–145)

## 2015-04-15 LAB — CBC
HCT: 38.2 % (ref 36.0–46.0)
HEMATOCRIT: 35 % — AB (ref 36.0–46.0)
Hemoglobin: 10.8 g/dL — ABNORMAL LOW (ref 12.0–15.0)
Hemoglobin: 11.9 g/dL — ABNORMAL LOW (ref 12.0–15.0)
MCH: 23.6 pg — ABNORMAL LOW (ref 26.0–34.0)
MCH: 23.6 pg — ABNORMAL LOW (ref 26.0–34.0)
MCHC: 30.9 g/dL (ref 30.0–36.0)
MCHC: 31.2 g/dL (ref 30.0–36.0)
MCV: 76.6 fL — AB (ref 78.0–100.0)
MCV: 75.8 fL — ABNORMAL LOW (ref 78.0–100.0)
Platelets: 189 10*3/uL (ref 150–400)
Platelets: 195 10*3/uL (ref 150–400)
RBC: 4.57 MIL/uL (ref 3.87–5.11)
RBC: 5.04 MIL/uL (ref 3.87–5.11)
RDW: 20.6 % — AB (ref 11.5–15.5)
RDW: 20.7 % — ABNORMAL HIGH (ref 11.5–15.5)
WBC: 3.8 10*3/uL — AB (ref 4.0–10.5)
WBC: 4 10*3/uL (ref 4.0–10.5)

## 2015-04-15 LAB — TSH: TSH: 1.234 u[IU]/mL (ref 0.350–4.500)

## 2015-04-15 LAB — PROTIME-INR
INR: 1.16 (ref 0.00–1.49)
Prothrombin Time: 15 seconds (ref 11.6–15.2)

## 2015-04-15 MED ORDER — SODIUM CHLORIDE 0.9 % IV SOLN: 100.0000 mL | INTRAVENOUS | Status: DC | PRN

## 2015-04-15 MED ORDER — LABETALOL HCL 200 MG PO TABS
300.0000 mg | ORAL_TABLET | Freq: Three times a day (TID) | ORAL | Status: DC
  Administered 2015-04-15 – 2015-04-17 (×8): 300 mg via ORAL
  Filled 2015-04-15 (×8): qty 2

## 2015-04-15 MED ORDER — ONDANSETRON HCL 4 MG PO TABS
4.0000 mg | ORAL_TABLET | Freq: Four times a day (QID) | ORAL | Status: DC | PRN
  Administered 2015-04-16: 4 mg via ORAL
  Filled 2015-04-15: qty 1

## 2015-04-15 MED ORDER — ACETAMINOPHEN 650 MG RE SUPP: 650.0000 mg | Freq: Four times a day (QID) | RECTAL | Status: DC | PRN

## 2015-04-15 MED ORDER — LEVOTHYROXINE SODIUM 50 MCG PO TABS
50.0000 ug | ORAL_TABLET | Freq: Every day | ORAL | Status: DC
  Administered 2015-04-15 – 2015-04-17 (×3): 50 ug via ORAL
  Filled 2015-04-15 (×3): qty 1

## 2015-04-15 MED ORDER — PANTOPRAZOLE SODIUM 40 MG PO TBEC
40.0000 mg | DELAYED_RELEASE_TABLET | Freq: Every day | ORAL | Status: DC
  Administered 2015-04-15 – 2015-04-17 (×3): 40 mg via ORAL
  Filled 2015-04-15 (×3): qty 1

## 2015-04-15 MED ORDER — PENTAFLUOROPROP-TETRAFLUOROETH EX AERO: 1.0000 "application " | INHALATION_SPRAY | CUTANEOUS | Status: DC | PRN

## 2015-04-15 MED ORDER — DOCUSATE SODIUM 100 MG PO CAPS
100.0000 mg | ORAL_CAPSULE | Freq: Two times a day (BID) | ORAL | Status: DC
  Administered 2015-04-15 – 2015-04-17 (×6): 100 mg via ORAL
  Filled 2015-04-15 (×6): qty 1

## 2015-04-15 MED ORDER — NIFEDIPINE ER OSMOTIC RELEASE 30 MG PO TB24
90.0000 mg | ORAL_TABLET | Freq: Every day | ORAL | Status: DC
  Administered 2015-04-15 – 2015-04-17 (×3): 90 mg via ORAL
  Filled 2015-04-15 (×3): qty 3

## 2015-04-15 MED ORDER — LIDOCAINE-PRILOCAINE 2.5-2.5 % EX CREA: 1.0000 "application " | TOPICAL_CREAM | CUTANEOUS | Status: DC | PRN

## 2015-04-15 MED ORDER — HEPARIN SODIUM (PORCINE) 1000 UNIT/ML DIALYSIS: 1000.0000 [IU] | INTRAMUSCULAR | Status: DC | PRN

## 2015-04-15 MED ORDER — ACETAMINOPHEN 325 MG PO TABS: 650.0000 mg | ORAL_TABLET | Freq: Four times a day (QID) | ORAL | Status: DC | PRN

## 2015-04-15 MED ORDER — DEXTROSE 5 % IV SOLN
1.0000 g | INTRAVENOUS | Status: DC
  Administered 2015-04-15 – 2015-04-16 (×3): 1 g via INTRAVENOUS
  Filled 2015-04-15 (×4): qty 10

## 2015-04-15 MED ORDER — SEVELAMER CARBONATE 800 MG PO TABS
800.0000 mg | ORAL_TABLET | ORAL | Status: DC
  Administered 2015-04-17: 800 mg via ORAL

## 2015-04-15 MED ORDER — SODIUM BICARBONATE 650 MG PO TABS
650.0000 mg | ORAL_TABLET | Freq: Two times a day (BID) | ORAL | Status: DC
  Administered 2015-04-15 – 2015-04-17 (×6): 650 mg via ORAL
  Filled 2015-04-15 (×6): qty 1

## 2015-04-15 MED ORDER — ENOXAPARIN SODIUM 30 MG/0.3ML ~~LOC~~ SOLN
30.0000 mg | SUBCUTANEOUS | Status: DC
  Administered 2015-04-15 – 2015-04-16 (×3): 30 mg via SUBCUTANEOUS
  Filled 2015-04-15 (×3): qty 0.3

## 2015-04-15 MED ORDER — ONDANSETRON HCL 4 MG/2ML IJ SOLN: 4.0000 mg | Freq: Four times a day (QID) | INTRAMUSCULAR | Status: DC | PRN

## 2015-04-15 MED ORDER — SEVELAMER CARBONATE 800 MG PO TABS
2400.0000 mg | ORAL_TABLET | Freq: Three times a day (TID) | ORAL | Status: DC
  Administered 2015-04-15 – 2015-04-16 (×5): 2400 mg via ORAL
  Filled 2015-04-15 (×8): qty 3

## 2015-04-15 MED ORDER — MECLIZINE HCL 12.5 MG PO TABS
25.0000 mg | ORAL_TABLET | Freq: Four times a day (QID) | ORAL | Status: DC | PRN
  Administered 2015-04-16 – 2015-04-17 (×5): 25 mg via ORAL
  Filled 2015-04-15 (×5): qty 2

## 2015-04-15 MED ORDER — CEFTRIAXONE SODIUM 1 G IJ SOLR
INTRAMUSCULAR | Status: AC
  Filled 2015-04-15: qty 10

## 2015-04-15 MED ORDER — HYDRALAZINE HCL 25 MG PO TABS
100.0000 mg | ORAL_TABLET | Freq: Three times a day (TID) | ORAL | Status: DC
  Administered 2015-04-15 – 2015-04-17 (×8): 100 mg via ORAL
  Filled 2015-04-15 (×4): qty 4
  Filled 2015-04-15: qty 2
  Filled 2015-04-15: qty 4
  Filled 2015-04-15: qty 2
  Filled 2015-04-15 (×2): qty 4
  Filled 2015-04-15 (×3): qty 2
  Filled 2015-04-15: qty 4

## 2015-04-15 MED ORDER — LIDOCAINE HCL (PF) 1 % IJ SOLN: 5.0000 mL | INTRAMUSCULAR | Status: DC | PRN

## 2015-04-15 MED ORDER — HYDRALAZINE HCL 20 MG/ML IJ SOLN
10.0000 mg | Freq: Four times a day (QID) | INTRAMUSCULAR | Status: DC | PRN
  Administered 2015-04-15: 10 mg via INTRAVENOUS
  Filled 2015-04-15: qty 1

## 2015-04-15 MED ORDER — ALTEPLASE 2 MG IJ SOLR: 2.0000 mg | Freq: Once | INTRAMUSCULAR | Status: DC | PRN

## 2015-04-15 MED ORDER — ALLOPURINOL 300 MG PO TABS: 300.0000 mg | ORAL_TABLET | Freq: Every day | ORAL | Status: DC | PRN

## 2015-04-15 MED ORDER — HEPARIN SODIUM (PORCINE) 1000 UNIT/ML DIALYSIS: 20.0000 [IU]/kg | INTRAMUSCULAR | Status: DC | PRN

## 2015-04-15 MED ORDER — CLONIDINE HCL 0.2 MG PO TABS
0.2000 mg | ORAL_TABLET | Freq: Three times a day (TID) | ORAL | Status: DC
  Administered 2015-04-15 – 2015-04-17 (×8): 0.2 mg via ORAL
  Filled 2015-04-15 (×9): qty 1

## 2015-04-15 MED ORDER — SODIUM CHLORIDE 0.9 % IV SOLN
INTRAVENOUS | Status: DC
  Administered 2015-04-15: via INTRAVENOUS

## 2015-04-15 MED ORDER — MORPHINE SULFATE (PF) 2 MG/ML IV SOLN: 1.0000 mg | INTRAVENOUS | Status: DC | PRN

## 2015-04-15 MED ORDER — SODIUM CHLORIDE 0.9 % IJ SOLN
3.0000 mL | Freq: Two times a day (BID) | INTRAMUSCULAR | Status: DC
  Administered 2015-04-15 – 2015-04-16 (×5): 3 mL via INTRAVENOUS

## 2015-04-15 MED ORDER — HYDROCODONE-ACETAMINOPHEN 5-325 MG PO TABS: 1.0000 | ORAL_TABLET | ORAL | Status: DC | PRN

## 2015-04-15 MED ORDER — LABETALOL HCL 5 MG/ML IV SOLN: 10.0000 mg | INTRAVENOUS | Status: DC | PRN

## 2015-04-15 NOTE — ED Provider Notes (Signed)
CSN: [id number]     Arrival date & time 04/14/15  1115 History   First MD Initiated Contact with Patient 04/14/15 1322     Chief Complaint  Patient presents with  . Emesis  . Chills     (Consider location/radiation/quality/duration/timing/severity/associated sxs/prior Treatment) HPI Comments: 64 y.o. Female with history of ESRD on dialysis T/TR/S, HTN presents for nausea and feeling unwell.  The patient reports that on Saturday while at dialysis they had cold air blowing on her and that since that time the patient has not been feeling well.  She says she has been too nauseous to take her home medications.  She reports one episode of vomiting after dialysis but no further episodes.  She denies fever but has had chills.  No diarrhea.  Reports normal urinary patterns.  She reports a mild headache intermittently.  No focal weakness or sensory changes.  No known sick contacts.  Patient scheduled for dialysis tomorrow morning.  She does report dizziness which she has had for years and treats at home with antivert.  She says the dizziness has been off and on over the last 3 days.  Patient is a 64 y.o. female presenting with vomiting.  Emesis Associated symptoms: chills   Associated symptoms: no abdominal pain, no diarrhea, no headaches and no myalgias     Past Medical History  Diagnosis Date  . HTN (hypertension)   . Hypothyroidism   . Uterine cancer 2012  . Anemia   . CKD (chronic kidney disease)   . Dysphagia   . Hyperparathyroidism due to renal insufficiency   . GERD (gastroesophageal reflux disease)   . Diverticulitis   . Vertical diplopia    Past Surgical History  Procedure Laterality Date  . Cholecystectomy    . Cesarean section    . Laparoscopic total hysterectomy    . Colonoscopy  2000    TICS, IH  . Colonoscopy  2003 NUR BRBPR D50 V6    DC/[place] TICS, IH  . Abdominal hysterectomy    . Nasal septum surgery    . Esophagogastroduodenoscopy (egd) with esophageal dilation N/A  06/08/2013    Dr. Fields:Stricture at the gastroesophageal junction/MILD NON-erosive gastritis (inflammation) was found in the gastric antrum; multiple biopsies/NO SOURCE FOR ANEMIA IDETIFIED-MOST LIKELY DUE TO ANEMIA OF CHRONIC DISEASE  . Colonoscopy N/A 09/04/2013    Procedure: COLONOSCOPY;  Surgeon: Danie Binder, MD;  [facility];  Service: Endoscopy;  Laterality: N/A;  10:30AM-moved to 9:25 [name]  . Appendectomy    . Bascilic vein transposition Right 09/17/2013    Procedure: BRACHIAL VEIN TRANSPOSITION;  Surgeon: Conrad Villalba, MD;  Location: [place];  Service: Vascular;  Laterality: Right;  . Insertion of dialysis catheter Left 09/17/2013    Procedure: INSERTION OF DIALYSIS CATHETER;  Surgeon: Conrad Cheval, MD;  Location: [place];  Service: Vascular;  Laterality: Left;  . Bascilic vein transposition Right 10/29/2013    Procedure: RIGHT SECOND STAGE BRACHIAL VEIN TRANSPOSITION;  Surgeon: Conrad Savannah, MD;  Location: [facility] OR;  Service: Vascular;  Laterality: Right;  . Givens capsule study N/A 12/03/2013    Procedure: GIVENS CAPSULE STUDY;  Surgeon: Danie Binder, MD;  [facility];  Service: Endoscopy;  Laterality: N/A;  7:30   Family History  Problem Relation Age of Onset  . Colon cancer Neg Hx   . Cancer Mother   . Diabetes Mother   . Hypertension Mother   . Diabetes Father   .  Hypertension Father   . Hypertension Sister    Social History  Substance Use Topics  . Smoking status: Never Smoker   . Smokeless tobacco: Never Used  . Alcohol Use: No   OB History    Gravida Para Term Preterm AB TAB SAB Ectopic Multiple Living   1 1 1             Review of Systems  Constitutional: Positive for chills, appetite change and fatigue. Negative for fever.  HENT: Negative for congestion, postnasal drip and rhinorrhea.   Eyes: Negative for pain and redness.  Respiratory: Negative for cough, chest tightness and shortness of breath.   Cardiovascular: Negative for  chest pain and palpitations.  Gastrointestinal: Positive for nausea and vomiting (x1). Negative for abdominal pain and diarrhea.  Genitourinary: Negative for dysuria, urgency, frequency, hematuria and decreased urine volume.  Musculoskeletal: Negative for myalgias, back pain and neck pain.  Skin: Negative for rash.  Neurological: Positive for dizziness (intermittent). Negative for weakness, light-headedness and headaches.  Hematological: Negative for adenopathy. Does not bruise/bleed easily.      Allergies  Cephalexin  Home Medications   Prior to Admission medications   Medication Sig Start Date End Date Taking? Authorizing Provider  allopurinol (ZYLOPRIM) 300 MG tablet Take 300 mg by mouth daily as needed (for gout).    Yes Historical Provider, MD  cloNIDine (CATAPRES) 0.2 MG tablet Take 1 tablet by mouth 3 (three) times daily. 11/22/14  Yes Historical Provider, MD  docusate sodium (COLACE) 100 MG capsule Take 100 mg by mouth 2 (two) times daily.   Yes Historical Provider, MD  ferrous sulfate dried (SLOW FE) 160 (50 FE) MG TBCR SR tablet Take 640 mg by mouth daily.   Yes Historical Provider, MD  hydrALAZINE (APRESOLINE) 100 MG tablet Take 100 mg by mouth 3 (three) times daily. 04/09/15  Yes Historical Provider, MD  labetalol (NORMODYNE) 300 MG tablet Take 1 tablet (300 mg total) by mouth 3 (three) times daily. 08/20/13  Yes Kinnie Feil, MD  levothyroxine (SYNTHROID, LEVOTHROID) 50 MCG tablet Take 1 tablet (50 mcg total) by mouth daily. 08/20/13  Yes Kinnie Feil, MD  lidocaine-prilocaine (EMLA) cream Apply 1 application topically as needed (on Dialysis days Tues,Thurs.,Sat.).   Yes Historical Provider, MD  meclizine (ANTIVERT) 25 MG tablet Take 1 tablet by mouth 4 (four) times daily as needed. dizziness 11/22/14  Yes Historical Provider, MD  NIFEdipine (PROCARDIA XL/ADALAT-CC) 90 MG 24 hr tablet Take 90 mg by mouth daily. 04/10/15  Yes Historical Provider, MD  omeprazole (PRILOSEC) 20  MG capsule Take 1 capsule (20 mg total) by mouth daily. 07/29/14  Yes Orvil Feil, NP  sevelamer carbonate (RENVELA) 800 MG tablet Take 800-2,400 mg by mouth 3 (three) times daily with meals. Patient takes 1 tablet with snack and 3 tablets with meal   Yes Historical Provider, MD  sodium bicarbonate 650 MG tablet Take 1 tablet (650 mg total) by mouth 2 (two) times daily. Patient taking differently: Take 650 mg by mouth daily.  06/12/13  Yes Kathie Dike, MD  hydrALAZINE (APRESOLINE) 50 MG tablet Take 1 tablet (50 mg total) by mouth every 8 (eight) hours. Patient not taking: Reported on 04/14/2015 10/22/14   Reyne Dumas, MD  Linaclotide (LINZESS) 290 MCG CAPS capsule 1 PO 30 MINS PRIOR TO BREAKFAST. IF O SATISFACTORY BM AFTER 2 WEEKS, TAKE WITH BREAKFAST. Patient not taking: Reported on 04/14/2015 05/01/14   Danie Binder, MD  Nutritional Supplements (FEEDING SUPPLEMENT, NEPRO  CARB STEADY,) LIQD Take 237 mLs by mouth as needed (missed meal during dialysis.). Patient not taking: Reported on 04/14/2015 10/22/14   Reyne Dumas, MD   BP 185/78 mmHg  Pulse 70  Temp(Src) 98.1 F (36.7 C) (Oral)  Resp 20  Ht 5\' 8"  (1.727 m)  Wt 137 lb 9.6 oz (62.415 kg)  BMI 20.93 kg/m2  SpO2 99% Physical Exam  Constitutional: She is oriented to person, place, and time. No distress.  HENT:  Head: Normocephalic and atraumatic.  Right Ear: External ear normal.  Left Ear: External ear normal.  Mouth/Throat: Oropharynx is clear and moist. No oropharyngeal exudate.  Eyes: EOM are normal. Pupils are equal, round, and reactive to light.  Neck: Normal range of motion. Neck supple.  Cardiovascular: Normal rate, regular rhythm, normal heart sounds and intact distal pulses.   No murmur heard. Pulmonary/Chest: Effort normal. No respiratory distress. She has no wheezes. She has no rales.  Abdominal: Soft. She exhibits no distension and no mass. There is no tenderness. There is no guarding.  Musculoskeletal: Normal range of  motion. She exhibits no edema or tenderness.  Neurological: She is alert and oriented to person, place, and time. She has normal strength. No cranial nerve deficit or sensory deficit. Coordination normal.  Skin: Skin is warm and dry. She is not diaphoretic.  Vitals reviewed.   ED Course  Procedures (including critical care time) Labs Review Labs Reviewed  CBC WITH DIFFERENTIAL/PLATELET - Abnormal; Notable for the following:    WBC 3.9 (*)    RBC 5.26 (*)    MCV 77.0 (*)    MCH 23.6 (*)    RDW 20.8 (*)    All other components within normal limits  COMPREHENSIVE METABOLIC PANEL - Abnormal; Notable for the following:    Chloride 100 (*)    BUN 30 (*)    Creatinine, Ser 7.56 (*)    AST 14 (*)    ALT 9 (*)    GFR calc non Af Amer 5 (*)    GFR calc Af Amer 6 (*)    All other components within normal limits  URINALYSIS, ROUTINE W REFLEX MICROSCOPIC (NOT AT [facility]) - Abnormal; Notable for the following:    pH 8.5 (*)    Glucose, UA 100 (*)    Hgb urine dipstick SMALL (*)    Protein, ur >300 (*)    Leukocytes, UA TRACE (*)    All other components within normal limits  URINE MICROSCOPIC-ADD ON - Abnormal; Notable for the following:    Squamous Epithelial / LPF MANY (*)    Bacteria, UA FEW (*)    Casts HYALINE CASTS (*)    All other components within normal limits  URINE CULTURE  LIPASE, BLOOD  TROPONIN I  LACTIC ACID, PLASMA  LACTIC ACID, PLASMA  PROTIME-INR  TSH  HEMOGLOBIN [id number]  BASIC METABOLIC PANEL  CBC    Imaging Review Dg Abd Acute W/chest  04/14/2015   CLINICAL DATA:  Dialysis patient. Became nauseated and vomited during dialysis.  EXAM: DG ABDOMEN ACUTE W/ 1V CHEST  COMPARISON:  10/22/2014  FINDINGS: The heart is enlarged. There is interstitial pulmonary edema. Small bilateral pleural effusions are identified. Bowel gas pattern is nonobstructive. There is no free intraperitoneal air beneath the diaphragm. Surgical clips are noted right upper quadrant of the abdomen.   IMPRESSION: 1. Cardiomegaly and interstitial edema. 2. Bilateral pleural effusions. 3. Nonobstructed bowel gas pattern.   Electronically Signed   By: Nolon Nations M.D.  On: 04/14/2015 15:15   I have personally reviewed and evaluated these images and lab results as part of my medical decision-making.   EKG Interpretation   Date/Time:  Monday April 14 2015 14:12:17 EDT Ventricular Rate:  100 PR Interval:  151 QRS Duration: 80 QT Interval:  390 QTC Calculation: 503 R Axis:   85 Text Interpretation:  Sinus tachycardia Biatrial enlargement Borderline  right axis deviation Left ventricular hypertrophy Prolonged QT interval  Baseline wander in lead(s) II III aVF Tachycardia but otherwise no  significant change compared to previous tracing Confirmed by Fatim Vanderschaaf  [phone]) on 04/14/2015 6:11:31 PM      MDM  Patient was seen and evaluated in stable condition.  Patient severely hypertensive on presentation.  Patient was given Hydralazine and Catapres with improvement in blood pressure and patient initially said improvement in symptoms.  Laboratory studies were unremarkable for patient other than elevated WBC in UA.  Blood pressure did go up again while in the [facility] which coincided with return of symptoms.  Case was discussed with Dr. Hartford Poli who agreed with admission and the patient was admitted under the care of the [facility] in stable condition.  Patient was updated on results and plan of care.  Patient was started on Rocephin. Final diagnoses:  Secondary hypertension, unspecified  Uncontrolled hypertension  UTI (lower urinary tract infection)    1. Uncontrolled Hypertension  2. UTI    Harvel Quale, MD 04/15/15 [phone]

## 2015-04-15 NOTE — Consult Note (Signed)
Sonya [name] MRN: [id number] DOB/AGE: 1950/12/22 64 y.o. Primary Care Physician:FUSCO,LAWRENCE J., MD Admit date: 04/14/2015 Chief Complaint:  Chief Complaint  Patient presents with  . Emesis  . Chills   HPI: Pt is 64 year old female with past medical hx of ESRD who came to ER with c/o Emesis  HPI dates back to 2-3  Days ago when pt started having nausea and vomiting. Upon evaluation in ER pt was found to have UTI and sevre HTN and was admitted for further tx. Pt seen today on 3rd floor. Pt says she is feeling much better than at the time of admission. Pt says " I have not thrown up since admission" NO c/o change in speech NO c/o chest pain NO c/o dyspnea No c/o fevr/cough/chills NO c/o nausea/vomiting. NO c/o abdominal pain.  Past Medical History  Diagnosis Date  . HTN (hypertension)   . Hypothyroidism   . Uterine cancer 2012  . Anemia   . CKD (chronic kidney disease)   . Dysphagia   . Hyperparathyroidism due to renal insufficiency   . GERD (gastroesophageal reflux disease)   . Diverticulitis   . Vertical diplopia        Family History  Problem Relation Age of Onset  . Colon cancer Neg Hx   . Cancer Mother   . Diabetes Mother   . Hypertension Mother   . Diabetes Father   . Hypertension Father   . Hypertension Sister     Social History:  reports that she has never smoked. She has never used smokeless tobacco. She reports that she does not drink alcohol or use illicit drugs.   Allergies:  Allergies  Allergen Reactions  . Cephalexin Itching    Medications Prior to Admission  Medication Sig Dispense Refill  . allopurinol (ZYLOPRIM) 300 MG tablet Take 300 mg by mouth daily as needed (for gout).     . cloNIDine (CATAPRES) 0.2 MG tablet Take 1 tablet by mouth 3 (three) times daily.    Marland Kitchen docusate sodium (COLACE) 100 MG capsule Take 100 mg by mouth 2 (two) times daily.    . ferrous sulfate dried (SLOW FE) 160 (50 FE) MG TBCR SR tablet Take 640 mg by mouth  daily.    . hydrALAZINE (APRESOLINE) 100 MG tablet Take 100 mg by mouth 3 (three) times daily.    Marland Kitchen labetalol (NORMODYNE) 300 MG tablet Take 1 tablet (300 mg total) by mouth 3 (three) times daily. 90 tablet 1  . levothyroxine (SYNTHROID, LEVOTHROID) 50 MCG tablet Take 1 tablet (50 mcg total) by mouth daily. 30 tablet 2  . lidocaine-prilocaine (EMLA) cream Apply 1 application topically as needed (on Dialysis days Tues,Thurs.,Sat.).    Marland Kitchen meclizine (ANTIVERT) 25 MG tablet Take 1 tablet by mouth 4 (four) times daily as needed. dizziness    . NIFEdipine (PROCARDIA XL/ADALAT-CC) 90 MG 24 hr tablet Take 90 mg by mouth daily.    Marland Kitchen omeprazole (PRILOSEC) 20 MG capsule Take 1 capsule (20 mg total) by mouth daily. 31 capsule 5  . sevelamer carbonate (RENVELA) 800 MG tablet Take 800-2,400 mg by mouth 3 (three) times daily with meals. Patient takes 1 tablet with snack and 3 tablets with meal    . sodium bicarbonate 650 MG tablet Take 1 tablet (650 mg total) by mouth 2 (two) times daily. (Patient taking differently: Take 650 mg by mouth daily. ) 60 tablet 1  . hydrALAZINE (APRESOLINE) 50 MG tablet Take 1 tablet (50 mg total) by mouth every  8 (eight) hours. (Patient not taking: Reported on 04/14/2015) 90 tablet 0  . Linaclotide (LINZESS) 290 MCG CAPS capsule 1 PO 30 MINS PRIOR TO BREAKFAST. IF O SATISFACTORY BM AFTER 2 WEEKS, TAKE WITH BREAKFAST. (Patient not taking: Reported on 04/14/2015) 30 capsule 11  . Nutritional Supplements (FEEDING SUPPLEMENT, NEPRO CARB STEADY,) LIQD Take 237 mLs by mouth as needed (missed meal during dialysis.). (Patient not taking: Reported on 04/14/2015) 237 mL 0       HKV:QQVZD from the symptoms mentioned above,there are no other symptoms referable to all systems reviewed.  . cefTRIAXone (ROCEPHIN)  IV  1 g Intravenous Q24H  . cloNIDine  0.2 mg Oral TID  . docusate sodium  100 mg Oral BID  . enoxaparin (LOVENOX) injection  30 mg Subcutaneous Q24H  . hydrALAZINE  100 mg Oral TID  .  labetalol  300 mg Oral TID  . levothyroxine  50 mcg Oral QAC breakfast  . NIFEdipine  90 mg Oral Daily  . pantoprazole  40 mg Oral Daily  . sevelamer carbonate  2,400 mg Oral TID WC  . sevelamer carbonate  800 mg Oral With snacks  . sodium bicarbonate  650 mg Oral BID  . sodium chloride  3 mL Intravenous Q12H      Physical Exam: Vital signs in last 24 hours: Temp:  [97.4 F (36.3 C)-98.6 F (37 C)] 98.1 F (36.7 C) (09/13 0410) Pulse Rate:  [66-111] 67 (09/13 0410) Resp:  [0-31] 20 (09/13 0410) BP: (154-269)/(68-134) 189/90 mmHg (09/13 0410) SpO2:  [96 %-100 %] 100 % (09/13 0410) Weight:  [137 lb 8 oz (62.37 kg)-165 lb (74.844 kg)] 137 lb 8 oz (62.37 kg) (09/13 0410) Weight change:  Last BM Date: 04/14/15  Intake/Output from previous day: 09/12 0701 - 09/13 0700 In: -  Out: 200 [Urine:200] Total I/O In: 240 [P.O.:240] Out: -    Physical Exam: General- pt is awake,alert, oriented to time place and person Resp- No acute REsp distress, CTA B/L NO Rhonchi CVS- S1S2 regular in rate and rhythm GIT- BS+, soft, NT, ND EXT- NO LE Edema, Cyanosis CNS- CN 2-12 grossly intact. Moving all 4 extremities Psych- normal mod and affect Access- AVF+   Lab Results: CBC  Recent Labs  04/14/15 1414  WBC 3.9*  HGB 12.4  HCT 40.5  PLT 192    BMET  Recent Labs  04/14/15 1414  NA 135  K 4.0  CL 100*  CO2 27  GLUCOSE 89  BUN 30*  CREATININE 7.56*  CALCIUM 9.0    MICRO No results found for this or any previous visit (from the past 240 hour(s)).    Lab Results  Component Value Date   PTH 473.2* 06/09/2013   CALCIUM 9.0 04/14/2015   CAION 1.16 11/27/2014   PHOS 4.4 10/22/2014      Impression: 1)Renal  ESRD on Hd .               Pt on TTS schedule as outpt               Will dialyze today  2)HTN  BP better though not at goal HD should help Medication- On Calcium Channel Blockers On Alpha and beta Blockers On Vasodilators. On [employer].  3)Anemia HGb at goal. NO need of EPO   4)CKD Mineral-Bone Disorder PTH elevated  Secondary Hyperparathyroidism  present Phosphorus at goal.   5)CNS- hx of cerebral bleed Primary MD following  6)Electrolytes  Normokalemic  NOrmonatremic   7)Acid base Co2 at goal  8) GI- admitted with nausea and emesis     Primary Md folowing  9) ID- admitted with UTI  Plan:  Will dialyze today Will use 3 k bath NO need of EPO     Jyllian Haynie S 04/15/2015, 9:38 AM

## 2015-04-15 NOTE — Progress Notes (Signed)
Notified MD of pt's blood pressure and to clarify order. Pt given hydralazine and clonidine in ED at 9:40p.m., wanted to clarify if the 12:00a.m. Dose was still needed. MD states to hold those two medications and use PRNs in between if blood pressure still continues to run out of parameters. Will continue to monitor pt.

## 2015-04-15 NOTE — Progress Notes (Signed)
[facility] PROGRESS NOTE  BERTIE BURCHILL NWG:[id number] DOB: 30-Jul-1951 DOA: 04/14/2015 PCP: [name], MD  Assessment/Plan: 1. Nausea and vomiting. Possibly related to UTI. Clinically improving. Tolerating solid diet 2. Accelerated hypertension. Blood pressure was 235/108 on admission since she was unable to tolerate any po intake. She underwent dialysis and is now able to tolerate oral meds. Blood pressure is improved. Continue to follow. 3. UTI. On rocephin. Follow up culture 4. ESRD. On HD. Nephrology following. Was dialyzed on 9/13. 5. Anemia of chronic disease. Hemoglobin is stable. Continue to follow  Code Status: full code Family Communication: discussed with patient Disposition Plan: discharge home once improved, possibly in AM   Consultants:  Nephrology  Procedures:    Antibiotics:  Rocephin 9/13>>  HPI/Subjective: Patient is feeling better today. No further nausea or vomiting. No shortness of breath  Objective: Filed Vitals:   04/15/15 1617  BP: 147/73  Pulse: 69  Temp: 97.7 F (36.5 C)  Resp: 16    Intake/Output Summary (Last 24 hours) at 04/15/15 1813 Last data filed at 04/15/15 1617  Gross per 24 hour  Intake    360 ml  Output   2225 ml  Net  -1865 ml   Filed Weights   04/14/15 1258 04/14/15 2358 04/15/15 0410  Weight: 74.844 kg (165 lb) 62.415 kg (137 lb 9.6 oz) 62.37 kg (137 lb 8 oz)    Exam:   General:  NAD  Cardiovascular: S1, S2 RRR  Respiratory: CTA B  Abdomen: soft, nt, nd, bs+  Musculoskeletal: no edema b/l   Data Reviewed: Basic Metabolic Panel:  Recent Labs Lab 04/14/15 1414 04/15/15 0916  NA 135 135  K 4.0 4.1  CL 100* 100*  CO2 27 26  GLUCOSE 89 129*  BUN 30* 38*  CREATININE 7.56* 9.32*  CALCIUM 9.0 8.8*   Liver Function Tests:  Recent Labs Lab 04/14/15 1414  AST 14*  ALT 9*  ALKPHOS 41  BILITOT 0.6  PROT 7.3  ALBUMIN 3.7    Recent Labs Lab 04/14/15 1414  LIPASE 32   No  results for input(s): AMMONIA in the last 168 hours. CBC:  Recent Labs Lab 04/14/15 1414 04/15/15 0916 04/15/15 1533  WBC 3.9* 3.8* 4.0  NEUTROABS 2.2  --   --   HGB 12.4 10.8* 11.9*  HCT 40.5 35.0* 38.2  MCV 77.0* 76.6* 75.8*  PLT 192 189 195   Cardiac Enzymes:  Recent Labs Lab 04/14/15 1414  TROPONINI <0.03   BNP (last 3 results)  Recent Labs  10/14/14 1608  BNP 1512.0*    ProBNP (last 3 results) No results for input(s): PROBNP in the last 8760 hours.  CBG: No results for input(s): GLUCAP in the last 168 hours.  No results found for this or any previous visit (from the past 240 hour(s)).   Studies: Dg Abd Acute W/chest  04/14/2015   CLINICAL DATA:  Dialysis patient. Became nauseated and vomited during dialysis.  EXAM: DG ABDOMEN ACUTE W/ 1V CHEST  COMPARISON:  10/22/2014  FINDINGS: The heart is enlarged. There is interstitial pulmonary edema. Small bilateral pleural effusions are identified. Bowel gas pattern is nonobstructive. There is no free intraperitoneal air beneath the diaphragm. Surgical clips are noted right upper quadrant of the abdomen.  IMPRESSION: 1. Cardiomegaly and interstitial edema. 2. Bilateral pleural effusions. 3. Nonobstructed bowel gas pattern.   Electronically Signed   By: Norva Pavlov M.D.   On: 04/14/2015 15:15    Scheduled Meds: . cefTRIAXone (ROCEPHIN)  IV  1 g Intravenous Q24H  . cloNIDine  0.2 mg Oral TID  . docusate sodium  100 mg Oral BID  . enoxaparin (LOVENOX) injection  30 mg Subcutaneous Q24H  . hydrALAZINE  100 mg Oral TID  . labetalol  300 mg Oral TID  . levothyroxine  50 mcg Oral QAC breakfast  . NIFEdipine  90 mg Oral Daily  . pantoprazole  40 mg Oral Daily  . sevelamer carbonate  2,400 mg Oral TID WC  . sevelamer carbonate  800 mg Oral With snacks  . sodium bicarbonate  650 mg Oral BID  . sodium chloride  3 mL Intravenous Q12H   Continuous Infusions: . sodium chloride 10 mL/hr at 04/15/15 0022    Principal  Problem:   UTI (lower urinary tract infection) Active Problems:   End stage renal disease   Dizziness   Uncontrolled hypertension   Orthostatic hypotension   Nausea and vomiting    Time spent:    [facility]  [facility] Pager [phone]. If 7PM-7AM, please contact night-coverage at [email or web address], password [facility] 04/15/2015, 6:13 PM  LOS: 1 day

## 2015-04-15 NOTE — Plan of Care (Signed)
Problem: Phase I Progression Outcomes Goal: Pain controlled with appropriate interventions Outcome: Progressing Pt denies pain. Goal: OOB as tolerated unless otherwise ordered Outcome: Progressing Pt ambulates to bathroom Goal: Vital Signs stable- temperature less than 102 Outcome: Not Progressing Blood pressure medications given, blood pressure still remains 180s/90s. Goal: Adequate I & O Outcome: Progressing Pt on renal diet and 1200 cc fluid restriction

## 2015-04-16 ENCOUNTER — Inpatient Hospital Stay (HOSPITAL_COMMUNITY): Payer: Medicare Other

## 2015-04-16 DIAGNOSIS — N186 End stage renal disease: Secondary | ICD-10-CM

## 2015-04-16 DIAGNOSIS — R112 Nausea with vomiting, unspecified: Secondary | ICD-10-CM

## 2015-04-16 DIAGNOSIS — N390 Urinary tract infection, site not specified: Secondary | ICD-10-CM

## 2015-04-16 LAB — BASIC METABOLIC PANEL WITH GFR
Anion gap: 7 (ref 5–15)
BUN: 19 mg/dL (ref 6–20)
CO2: 30 mmol/L (ref 22–32)
Calcium: 8.7 mg/dL — ABNORMAL LOW (ref 8.9–10.3)
Chloride: 97 mmol/L — ABNORMAL LOW (ref 101–111)
Creatinine, Ser: 6.12 mg/dL — ABNORMAL HIGH (ref 0.44–1.00)
GFR calc Af Amer: 8 mL/min — ABNORMAL LOW
GFR calc non Af Amer: 6 mL/min — ABNORMAL LOW
Glucose, Bld: 98 mg/dL (ref 65–99)
Potassium: 4.3 mmol/L (ref 3.5–5.1)
Sodium: 134 mmol/L — ABNORMAL LOW (ref 135–145)

## 2015-04-16 LAB — HEMOGLOBIN A1C
Hgb A1c MFr Bld: 5 % (ref 4.8–5.6)
MEAN PLASMA GLUCOSE: 97 mg/dL

## 2015-04-16 LAB — CBC
HCT: 33.9 % — ABNORMAL LOW (ref 36.0–46.0)
Hemoglobin: 10.7 g/dL — ABNORMAL LOW (ref 12.0–15.0)
MCH: 24 pg — ABNORMAL LOW (ref 26.0–34.0)
MCHC: 31.6 g/dL (ref 30.0–36.0)
MCV: 76 fL — ABNORMAL LOW (ref 78.0–100.0)
Platelets: 188 10*3/uL (ref 150–400)
RBC: 4.46 MIL/uL (ref 3.87–5.11)
RDW: 20.6 % — ABNORMAL HIGH (ref 11.5–15.5)
WBC: 3.6 10*3/uL — ABNORMAL LOW (ref 4.0–10.5)

## 2015-04-16 NOTE — Progress Notes (Signed)
D- Called via phone by a family member of the patient requesting that I check on the patient because the patient called her voicing that she did not feel well.  A- Upon assessing patient she reports she does not feel right.  When asked to explain her symptoms she reports "I just don't feel right. I thing I am having another stroke".  Neuro assessment reveals new confusion but is negative for others signs and symptoms of stroke(see flowsheets for further details).  Dr. Shanon Brow notified via phone and new orders were received and carried out.  Patient is verbally aggressive, slightly paranoid and argumentative with staff.  Patient's husband called for the second time at bedside and spoke with patient at which time she insisted he come to the hospital.    R- Patient's husband came to the hospital to see patient and stayed a short time at the bedside.  Patient in bed and assessment is unchanged at this time.  Patient is calmer and no longer aggressive.  Nursing staff to continue to monitor.

## 2015-04-16 NOTE — Care Management Note (Signed)
Case Management Note  Patient Details  Name: SINDIA GAMBLES MRN: [id number] Date of Birth: [birth year]  Expected Discharge Date:    04/17/2015              Expected Discharge Plan:  Home/Self Care  In-House Referral:  NA  Discharge planning Services  CM Consult  Post Acute Care Choice:  NA Choice offered to:  NA  DME Arranged:    DME Agency:     HH Arranged:    [place] Agency:     Status of Service:  Completed, signed off  Medicare Important Message Given:    Date Medicare IM Given:    Medicare IM give by:    Date Additional Medicare IM Given:    Additional Medicare Important Message give by:     If discussed at [place] of Stay Meetings, dates discussed:    Additional Comments: Pt admitted with UTI and hypertension. Pt is from home, lives with husband and daughter. Pt independent with ADL's. Pt has history of CVA. Pt is on HD T/TH/Sa at [facility] in [place]. Pt uses to [facility] prior to admission. Pt plans to return home at DC with self care. No CM needs noted.  Sherald Barge, RN 04/16/2015, 11:14 AM

## 2015-04-16 NOTE — Progress Notes (Signed)
PROGRESS NOTE  Sonya [name] [id number] DOB: 08/01/1951 DOA: 04/14/2015 PCP: [name], MD  Summary: 64 yo female with h/o ESRD, malignant HTN and anemia presented to the ED with nausea and vomiting. While in the ED pt was hypertensive and had abnormal UA, possible UTI. Pt was admitted for further evaluation and management.  Assessment/Plan: 1. Nausea and vomiting, likely related to UTI. N/V resolved. Pt is tolerating a solid diet.   2. Visual Hallucinations. Likely related to UTI. CT head negative. No further evalution suggested. Exam nonfocal, no evidence of bleeding. 3. UTI. UA revealed some bacteria. UC is in process. Will continue Rocephin.  4. ESRD. Pt was dialyzed yesterday. Creatinine 6.12. Check in the AM. Nephrology appreciated. 5. Leukopenia, chronic. 6. Accelerated/malignant HTN. Currently resolved. Secondary to missed meds from vomiting, rebound HTN from clonidine. BP is currently stable. Continue to monitor. 7. Anemia of CKD 8. GERD. Continue PPI. 9. Hypothyroid. Continue synthroid.   Overall improved. Continue IV abx.   Follow up culture data  Likely home in the next 24 hours.  Code Status: Full DVT prophylaxis: Lovenox Family discussion: Dicussed plan in detail. No further concerns at this time. Disposition Plan: Anticipate discharge within 1-2 days  Murray Hodgkins, MD  [facility]  Pager [phone] If 7PM-7AM, please contact night-coverage at [email or web address], password [facility] 04/16/2015, 11:33 AM  LOS: 2 days   Consultants:  Nephrology  Procedures:    Antibiotics:  Rocephin 9/13 >>  HPI/Subjective: Confused overnight with slurred speech but no other focal deficits. Paranoia per RN documentation. states that head felt funny last night and had hallucinations. No pain, unilateral weakness, numbness, tingling, dysphagia or difficulty speaking.   Objective: Filed Vitals:   04/15/15 1617 04/15/15 2128 04/15/15 2330 04/16/15 0530  BP:  147/73 144/57 146/62 138/62  Pulse: 69 73 67 64  Temp: 97.7 F (36.5 C) 98.3 F (36.8 C) 98.1 F (36.7 C) 98.4 F (36.9 C)  TempSrc: Oral Oral Oral Oral  Resp: 16 16 18 18   Height:      Weight:    61.961 kg (136 lb 9.6 oz)  SpO2: 98% 99%  98%    Intake/Output Summary (Last 24 hours) at 04/16/15 1133 Last data filed at 04/16/15 0932  Gross per 24 hour  Intake    480 ml  Output   2325 ml  Net  -1845 ml     Filed Weights   04/15/15 0410 04/16/15 0530  Weight: 62.37 kg (137 lb 8 oz) 61.961 kg (136 lb 9.6 oz)    Exam:  Afebrile General:  Appears calm and comfortable Eyes: PERRL, normal lids, irises & conjunctiva ENT: grossly normal hearing, lips & tongue Neck: no LAD, masses or thyromegaly Cardiovascular: 2/6 systolic murmur right upper external border. No r/g. No LE edema.  Telemetry: SR, no arrhythmias  Respiratory: CTA bilaterally, no w/r/r. Normal respiratory effort. Abdomen: soft, ntnd Skin: no rash or induration seen on limited exam Musculoskeletal: grossly normal tone BUE/BLE Psychiatric: grossly normal mood and affect, speech fluent and appropriate. Alert and oriented person, place time Neurologic: grossly non-focal. CN-2 intact. No pronator drift. Normal finger-to-nose.  New data reviewed:  Hgb stable 10.7   Chronic leukopenia noted  Creatinine 6.12  CT head  BNP consistent with UTI IMPRESSION: 1. No acute intracranial abnormality. 2. Minimal progressive opacification of right mastoid air cells, otherwise unchanged appearance of the brain from prior.  Pertinent data since admission:  BMP c/w ESRD  TSH WNL  U/a suggested infection  AXR IMPRESSION:  1. Cardiomegaly and interstitial edema. 2. Bilateral pleural effusions. 3. Nonobstructed bowel gas pattern.  Pending data:  UC  Scheduled Meds: . cefTRIAXone (ROCEPHIN)  IV  1 g Intravenous Q24H  . cloNIDine  0.2 mg Oral TID  . docusate sodium  100 mg Oral BID  . enoxaparin (LOVENOX)  injection  30 mg Subcutaneous Q24H  . hydrALAZINE  100 mg Oral TID  . labetalol  300 mg Oral TID  . levothyroxine  50 mcg Oral QAC breakfast  . NIFEdipine  90 mg Oral Daily  . pantoprazole  40 mg Oral Daily  . sevelamer carbonate  2,400 mg Oral TID WC  . sevelamer carbonate  800 mg Oral With snacks  . sodium bicarbonate  650 mg Oral BID  . sodium chloride  3 mL Intravenous Q12H   Continuous Infusions: . sodium chloride 10 mL/hr at 04/15/15 0022    Principal Problem:   UTI (lower urinary tract infection) Active Problems:   End stage renal disease   Dizziness   Uncontrolled hypertension   Orthostatic hypotension   Nausea and vomiting   Time spent 40 minutes  By signing my name below, I, Rhett Bannister attest that this documentation has been prepared under the direction and in the presence of Dr. Murray Hodgkins, M.D.   Electronically signed: Rhett Bannister  04/16/2015   I have reviewed the above documentation for accuracy and completeness, and I agree with the above. Murray Hodgkins, MD

## 2015-04-16 NOTE — Progress Notes (Signed)
Sonya [name]  MRN: [id number]  DOB/AGE: Aug 12, 1950 64 y.o.  Primary Care Physician:FUSCO,LAWRENCE J., MD  Admit date: 04/14/2015  Chief Complaint:  Chief Complaint  Patient presents with  . Emesis  . Chills    S-Pt presented on  04/14/2015 with  Chief Complaint  Patient presents with  . Emesis  . Chills  .    Pt today feels better    Pt offers no new complaints.  meds . cefTRIAXone (ROCEPHIN)  IV  1 g Intravenous Q24H  . cloNIDine  0.2 mg Oral TID  . docusate sodium  100 mg Oral BID  . enoxaparin (LOVENOX) injection  30 mg Subcutaneous Q24H  . hydrALAZINE  100 mg Oral TID  . labetalol  300 mg Oral TID  . levothyroxine  50 mcg Oral QAC breakfast  . NIFEdipine  90 mg Oral Daily  . pantoprazole  40 mg Oral Daily  . sevelamer carbonate  2,400 mg Oral TID WC  . sevelamer carbonate  800 mg Oral With snacks  . sodium bicarbonate  650 mg Oral BID  . sodium chloride  3 mL Intravenous Q12H       Physical Exam: Vital signs in last 24 hours: Temp:  [97.7 F (36.5 C)-98.4 F (36.9 C)] 97.7 F (36.5 C) (09/14 1325) Pulse Rate:  [60-73] 60 (09/14 1609) Resp:  [16-18] 18 (09/14 1325) BP: (111-146)/(52-62) 116/57 mmHg (09/14 1609) SpO2:  [98 %-100 %] 100 % (09/14 1325) Weight:  [136 lb 9.6 oz (61.961 kg)] 136 lb 9.6 oz (61.961 kg) (09/14 0530) Weight change: -28 lb 6.4 oz (-12.882 kg) Last BM Date: 04/14/15  Intake/Output from previous day: 09/13 0701 - 09/14 0700 In: 480 [P.O.:480] Out: 2325 [Urine:325]     Physical Exam: General- pt is awake,alert, oriented to time place and person Resp- No acute REsp distress, CTA B/L NO Rhonchi CVS- S1S2 regular in rate and rhythm GIT- BS+, soft, NT, ND EXT- NO LE Edema, Cyanosis Access- AVF+   Lab Results: CBC  Recent Labs  04/15/15 1533 04/16/15 0542  WBC 4.0 3.6*  HGB 11.9* 10.7*  HCT 38.2 33.9*  PLT 195 188    BMET  Recent Labs  04/15/15 0916 04/16/15 0542  NA 135 134*  K 4.1 4.3  CL 100* 97*  CO2  26 30  GLUCOSE 129* 98  BUN 38* 19  CREATININE 9.32* 6.12*  CALCIUM 8.8* 8.7*    MICRO Recent Results (from the past 240 hour(s))  Urine culture     Status: None (Preliminary result)   Collection Time: 04/15/15  1:06 PM  Result Value Ref Range Status   Specimen Description URINE, CLEAN CATCH  Final   Special Requests NONE  Final   Culture   Final    NO GROWTH < 12 HOURS Performed at [facility]    Report Status PENDING  Incomplete      Lab Results  Component Value Date   PTH 473.2* 06/09/2013   CALCIUM 8.7* 04/16/2015   CAION 1.16 11/27/2014   PHOS 4.4 10/22/2014         Impression: 1)Renal ESRD on Hd .  Pt on TTS schedule as outpt               Pt was dialyzed yesterday ,No need of dialysis today  2)HTN BP better Medication- On Calcium Channel Blockers On Alpha and beta Blockers On Vasodilators. On [employer].  3)Anemia HGb not at goal (9--11)   Will keep on epo  during Hd  4)CKD Mineral-Bone Disorder PTH elevated  Secondary Hyperparathyroidism present Phosphorus at goal.   5)CNS- hx of cerebral bleed Primary MD following  6)Electrolytes  NOmokalemic  Hyponatremic   Sec to ESRD  7)Acid base Co2 at goal  8) GI- admitted with nausea and emesis  Primary Md following  9) ID- admitted with UTI     On ABX   Plan:  Will continue current care Will dialyze in am      Sonya [name] S 04/16/2015, 8:36 PM

## 2015-04-17 DIAGNOSIS — G9340 Encephalopathy, unspecified: Secondary | ICD-10-CM

## 2015-04-17 DIAGNOSIS — I10 Essential (primary) hypertension: Secondary | ICD-10-CM

## 2015-04-17 LAB — BASIC METABOLIC PANEL
ANION GAP: 8 (ref 5–15)
BUN: 28 mg/dL — ABNORMAL HIGH (ref 6–20)
CO2: 28 mmol/L (ref 22–32)
Calcium: 8.6 mg/dL — ABNORMAL LOW (ref 8.9–10.3)
Chloride: 93 mmol/L — ABNORMAL LOW (ref 101–111)
Creatinine, Ser: 7.83 mg/dL — ABNORMAL HIGH (ref 0.44–1.00)
GFR calc Af Amer: 6 mL/min — ABNORMAL LOW (ref >60)
GFR, EST NON AFRICAN AMERICAN: 5 mL/min — AB (ref >60)
GLUCOSE: 92 mg/dL (ref 65–99)
POTASSIUM: 4.6 mmol/L (ref 3.5–5.1)
SODIUM: 129 mmol/L — AB (ref 135–145)

## 2015-04-17 LAB — CBC
HEMATOCRIT: 33.7 % — AB (ref 36.0–46.0)
HEMOGLOBIN: 10.7 g/dL — AB (ref 12.0–15.0)
MCH: 23.8 pg — ABNORMAL LOW (ref 26.0–34.0)
MCHC: 31.8 g/dL (ref 30.0–36.0)
MCV: 74.9 fL — ABNORMAL LOW (ref 78.0–100.0)
Platelets: 146 10*3/uL — ABNORMAL LOW (ref 150–400)
RBC: 4.5 MIL/uL (ref 3.87–5.11)
RDW: 20.3 % — ABNORMAL HIGH (ref 11.5–15.5)
WBC: 3 10*3/uL — AB (ref 4.0–10.5)

## 2015-04-17 LAB — URINE CULTURE

## 2015-04-17 LAB — GLUCOSE, CAPILLARY: Glucose-Capillary: 91 mg/dL (ref 65–99)

## 2015-04-17 MED ORDER — HEPARIN SODIUM (PORCINE) 1000 UNIT/ML IJ SOLN
INTRAMUSCULAR | Status: AC
  Filled 2015-04-17: qty 2

## 2015-04-17 MED ORDER — SODIUM CHLORIDE 0.9 % IV SOLN: 100.0000 mL | INTRAVENOUS | Status: DC | PRN

## 2015-04-17 MED ORDER — LEVOTHYROXINE SODIUM 50 MCG PO TABS: 50.0000 ug | ORAL_TABLET | Freq: Every day | ORAL | Status: AC

## 2015-04-17 NOTE — Procedures (Addendum)
   HEMODIALYSIS TREATMENT NOTE:  4 hour HD session was ordered. Pt stated prior to starting treatment, "I run 3 hours and 15 minutes."  CDN acknowledged this and stated that the ordering nephrologist would be paged once treatment was underway.  AVF was cannulated with ease and HD was initiated.  10 minutes into session pt saw that the HD machine had been programmed for 4 hours, as ordered.  CDN attempted to explain option to sign AMA waiver at the time of patient's choosing.  Pt interrupted, stating she had become very upset.  "I told you and you didn't listen to me," stated she wanted to talk to her doctor.  While paging Dr. Lowanda Foster, pt sat up in bed and declared, "I'm ready to go back to my room. I want off now."  Stated she didn't need to speak with nephrologist now. She became increasingly agitated as blood was reinfused. She took issue with how arterial needle was removed ("You jerked it out!"), refused gelfoam ("They don't use that on me, I don't care what you say it's for"), insisted hemostasis had been achieved in under 5 minutes and demanded "tape it up now. I know how long I bleed. Pull the other one."  Venous needle was removed and, unfortunately, blood began oozing from arterial gauze dressing. Pt began yelling, "Help me!  I'm bleeding to death!"  CDN held pressure on both cannulation sites. Cancer center staff responded promptly to pt's call and contacted primary nurse (V. Dildy) who came to pt's side.  Pt was visibly upset, requesting to see administrator.  Stated she didn't want this CDN "ever touching me again."  Hemostasis was achieved after 7 minutes. Pt refused vital signs, "they can get them when I get back to my room."  Dr. Lowanda Foster was notified of above. K 4.6  Total HD time: 12 minutes  Arrangements can be made for HD with another RN if pt is willing to reschedule.  Travarus Trudo L. Myrakle Wingler, RN, CDN

## 2015-04-17 NOTE — Progress Notes (Signed)
Subjective: Patient feels much better.No nausea or vomiting   Objective: Vital signs in last 24 hours: Temp:  [97.7 F (36.5 C)-98.4 F (36.9 C)] 98.4 F (36.9 C) (09/15 0622) Pulse Rate:  [60-71] 71 (09/15 0622) Resp:  [16-20] 20 (09/15 0622) BP: (111-164)/(52-64) 164/64 mmHg (09/15 0622) SpO2:  [97 %-100 %] 97 % (09/15 0622) Weight:  [136 lb 11 oz (62 kg)] 136 lb 11 oz (62 kg) (09/15 0622)  Intake/Output from previous day: 09/14 0701 - 09/15 0700 In: 480 [P.O.:480] Out: 100 [Urine:100] Intake/Output this shift:     Recent Labs  04/14/15 1414 04/15/15 0916 04/15/15 1533 04/16/15 0542  HGB 12.4 10.8* 11.9* 10.7*    Recent Labs  04/15/15 1533 04/16/15 0542  WBC 4.0 3.6*  RBC 5.04 4.46  HCT 38.2 33.9*  PLT 195 188    Recent Labs  04/15/15 0916 04/16/15 0542  NA 135 134*  K 4.1 4.3  CL 100* 97*  CO2 26 30  BUN 38* 19  CREATININE 9.32* 6.12*  GLUCOSE 129* 98  CALCIUM 8.8* 8.7*    Recent Labs  04/14/15 1427  INR 1.16    Patient is alert and no apparent distress Chest is clear Heart RRR No edema  Assessment/Plan: Problem#1 nausea and vomiting much better and able to tolerate eating Problem#2 ESRD: S/P dialysis yesterday and she is asymptomatic Problem#3 Hypertension: Her blood presssure is reasonably controlled Problem#4 Anemia: Her hemoglobin is in range Problem#5 UTI on antibiotics Problem#6 Metabolic bone disease: Her calcium is in range Plan: For dialysis in am which is her regular scheduel    Elene Downum S 04/17/2015, 8:25 AM

## 2015-04-17 NOTE — Care Management Note (Signed)
Case Management Note  Patient Details  Name: RASHAUNDA ROEVER MRN: [id number] Date of Birth: 1951-03-22   Expected Discharge Date:                  Expected Discharge Plan:  Home/Self Care  In-House Referral:  NA  Discharge planning Services  CM Consult  Post Acute Care Choice:  NA Choice offered to:  NA  DME Arranged:    DME Agency:     HH Arranged:    [place] Agency:     Status of Service:  Completed, signed off  Medicare Important Message Given:  Yes-second notification given Date Medicare IM Given:    Medicare IM give by:    Date Additional Medicare IM Given:    Additional Medicare Important Message give by:     If discussed at [place] of Stay Meetings, dates discussed:    Additional Comments: Pt will DC home today after HD.  Sherald Barge, RN 04/17/2015, 10:25 AM

## 2015-04-17 NOTE — Progress Notes (Signed)
Patient is confused this morning and refusing Lab to draw blood.  Explained to patient the importance of blood work and patient stated that she didn't have money to pay for it.  Explained to patient that she didn't need money that insurance would pay for it.  Patient stated that they couldn't draw blood if she didn't want them to.  Told the patient that yes she did have the right to refuse.  Will see if lab can try again later when the patient is more alert.  Will continue to monitor.

## 2015-04-17 NOTE — Discharge Summary (Signed)
Physician Discharge Summary  Sonya [name] [id number] DOB: 1951-06-16 DOA: 04/14/2015  PCP: [name], MD  Admit date: 04/14/2015 Discharge date: 04/17/2015  Recommendations for Outpatient Follow-up:  1. Follow-up suspected underlying dementia 2. Follow-up HTN, goal SBP <160 (include homehealth, outpatient follow-up instructions, specific recommendations for PCP to follow-up on, etc.)  Follow-up Information    Follow up with [name], MD. Schedule an appointment as soon as possible for a visit in 1 week.   Specialty:  Internal Medicine   Contact information:   [address] [place] [id number] [phone]      Discharge Diagnoses:  1. Nausea and vomiting 2. Accelerated/malignant HTN 3. UTI 4. Visual hallucinations, acute encephalopathy, delirium 5. Suspected underlying demenia 6. ESRD 7. Anemia of CKD  Discharge Condition: improved Disposition: home  Diet recommendation: heart healthy  Filed Weights   04/15/15 0410 04/16/15 0530 04/17/15 0622  Weight: 62.37 kg (137 lb 8 oz) 61.961 kg (136 lb 9.6 oz) 62 kg (136 lb 11 oz)    History of present illness:  64 yo female with h/o ESRD, malignant HTN and anemia presented to the ED with nausea and vomiting. While in the ED pt was hypertensive and had abnormal UA, possible UTI. Pt was admitted for further evaluation and management.  Hospital Course:  Sonya [name] was treated with supportive care and empiric abx; n/v resolved and tolerated diet. HTN improved with resumption of BP meds and HD. Developed delirium which has been a recurrent problem on previous hospitalizations but improved spontaneously. CT head was negative and exam was nonfocal. Given suspected underlying dementia and previous episodes of delirium, patient appears to have received maximal medical benefit.  1. Nausea and vomiting, possibly related to UTI vs HTN. Resolved. Tolerating diet.  2. Visual hallucinations, resolved. Suspect  acute encephalopathy/delirium superimposed on undiagnosed dementia. Possibly related to UTI though culture was unrevealing. CT head negative. Per record review pt noted to be confused 10/2014 and was transferred to [facility], seen by neurology and underwent repeat MRI which was without change, neurology thought it was multi-factorial, including hypertensive encephalopathy; EEG abnormal at that time, per documentation: "EEG is slightly abnormal because of the patient's intracranial hemorrhage." recommendations from neurology were for controlling BP, avoid sedating medications. Further history from friend and chart is notable forrecurrent delirium while hospitalized and symptoms suggestive of dementia over the last year. 3. UTI. UA revealed some bacteria. UC shows no growth to date. Treated with Rocephin.  4. ESRD on HD, refused HD today. D/w Dr. Lowanda Foster who states he will coordinate outpatient follow-up for HD in near future.  5. Accelerated/malignant HTN. Resolved. Secondary to missed meds from vomiting, rebound HTN from clonidine. BP is currently stable. Continue to monitor. 6. Anemia of CKD, stable 7. GERD. Continue PPI. 8. Hypothyroid. Continue synthroid.   Overall stable, improved; no hallucinations. Subacute delirium improved. Expect will continue to improve in outpatient setting. Head CT negative, exam nonfocal, patient alert and oriented and correctly recalls details of hospitalization. Plan home today, continue antihypertensives and outpatient follow-up  Discharge Instructions  Discharge Instructions    Diet - low sodium heart healthy    Complete by:  As directed      Discharge instructions    Complete by:  As directed   Call your physician or seek immediate medical attention for pain, fever, vomiting, confusion or worsening of symptoms.     Increase activity slowly    Complete by:  As directed  Current Discharge Medication List    CONTINUE these medications which have  CHANGED   Details  levothyroxine (SYNTHROID, LEVOTHROID) 50 MCG tablet Take 1 tablet (50 mcg total) by mouth daily. Qty: 30 tablet, Refills: 0      CONTINUE these medications which have NOT CHANGED   Details  allopurinol (ZYLOPRIM) 300 MG tablet Take 300 mg by mouth daily as needed (for gout).     cloNIDine (CATAPRES) 0.2 MG tablet Take 1 tablet by mouth 3 (three) times daily.    docusate sodium (COLACE) 100 MG capsule Take 100 mg by mouth 2 (two) times daily.    ferrous sulfate dried (SLOW FE) 160 (50 FE) MG TBCR SR tablet Take 640 mg by mouth daily.    hydrALAZINE (APRESOLINE) 100 MG tablet Take 100 mg by mouth 3 (three) times daily.    labetalol (NORMODYNE) 300 MG tablet Take 1 tablet (300 mg total) by mouth 3 (three) times daily. Qty: 90 tablet, Refills: 1    lidocaine-prilocaine (EMLA) cream Apply 1 application topically as needed (on Dialysis days Tues,Thurs.,Sat.).    meclizine (ANTIVERT) 25 MG tablet Take 1 tablet by mouth 4 (four) times daily as needed. dizziness    NIFEdipine (PROCARDIA XL/ADALAT-CC) 90 MG 24 hr tablet Take 90 mg by mouth daily.    omeprazole (PRILOSEC) 20 MG capsule Take 1 capsule (20 mg total) by mouth daily. Qty: 31 capsule, Refills: 5    sevelamer carbonate (RENVELA) 800 MG tablet Take 800-2,400 mg by mouth 3 (three) times daily with meals. Patient takes 1 tablet with snack and 3 tablets with meal    sodium bicarbonate 650 MG tablet Take 1 tablet (650 mg total) by mouth 2 (two) times daily. Qty: 60 tablet, Refills: 1      STOP taking these medications     Linaclotide (LINZESS) 290 MCG CAPS capsule      Nutritional Supplements (FEEDING SUPPLEMENT, NEPRO CARB STEADY,) LIQD        Allergies  Allergen Reactions  . Cephalexin Itching    The results of significant diagnostics from this hospitalization (including imaging, microbiology, ancillary and laboratory) are listed below for reference.    Significant Diagnostic Studies: Ct Head Wo  Contrast  04/16/2015   CLINICAL DATA:  Confusion.  Altered mental status and weakness.  EXAM: CT HEAD WITHOUT CONTRAST  TECHNIQUE: Contiguous axial images were obtained from the base of the skull through the vertex without intravenous contrast.  COMPARISON:  Head CT and brain MRI 11/25/2014  FINDINGS: No intracranial hemorrhage, mass effect, or midline shift. Chronic small vessel ischemic change, similar in distribution to prior. Remote lacunar infarcts bilateral thalamus. Left middle cranial fossa encephalocele, unchanged from prior. No hydrocephalus. The basilar cisterns are patent. No evidence of territorial infarct. Calvarium is intact. No inflammatory change about the paranasal sinuses. Progressive opacification of right mastoid air cells. Left mastoid air cells are clear.  IMPRESSION: 1.  No acute intracranial abnormality. 2. Minimal progressive opacification of right mastoid air cells, otherwise unchanged appearance of the brain from prior.   Electronically Signed   By: Jeb Levering M.D.   On: 04/16/2015 00:46   Dg Abd Acute W/chest  04/14/2015   CLINICAL DATA:  Dialysis patient. Became nauseated and vomited during dialysis.  EXAM: DG ABDOMEN ACUTE W/ 1V CHEST  COMPARISON:  10/22/2014  FINDINGS: The heart is enlarged. There is interstitial pulmonary edema. Small bilateral pleural effusions are identified. Bowel gas pattern is nonobstructive. There is no free intraperitoneal air beneath the diaphragm.  Surgical clips are noted right upper quadrant of the abdomen.  IMPRESSION: 1. Cardiomegaly and interstitial edema. 2. Bilateral pleural effusions. 3. Nonobstructed bowel gas pattern.   Electronically Signed   By: Nolon Nations M.D.   On: 04/14/2015 15:15    Microbiology: Recent Results (from the past 240 hour(s))  Urine culture     Status: None   Collection Time: 04/15/15  1:06 PM  Result Value Ref Range Status   Specimen Description URINE, CLEAN CATCH  Final   Special Requests NONE  Final    Culture   Final    MULTIPLE SPECIES PRESENT, SUGGEST RECOLLECTION Performed at [facility]    Report Status 04/17/2015 FINAL  Final     Labs: Basic Metabolic Panel:  Recent Labs Lab 04/14/15 1414 04/15/15 0916 04/16/15 0542 04/17/15 1037  NA 135 135 134* 129*  K 4.0 4.1 4.3 4.6  CL 100* 100* 97* 93*  CO2 27 26 30 28   GLUCOSE 89 129* 98 92  BUN 30* 38* 19 28*  CREATININE 7.56* 9.32* 6.12* 7.83*  CALCIUM 9.0 8.8* 8.7* 8.6*   Liver Function Tests:  Recent Labs Lab 04/14/15 1414  AST 14*  ALT 9*  ALKPHOS 41  BILITOT 0.6  PROT 7.3  ALBUMIN 3.7    Recent Labs Lab 04/14/15 1414  LIPASE 32    CBC:  Recent Labs Lab 04/14/15 1414 04/15/15 0916 04/15/15 1533 04/16/15 0542 04/17/15 1037  WBC 3.9* 3.8* 4.0 3.6* 3.0*  NEUTROABS 2.2  --   --   --   --   HGB 12.4 10.8* 11.9* 10.7* 10.7*  HCT 40.5 35.0* 38.2 33.9* 33.7*  MCV 77.0* 76.6* 75.8* 76.0* 74.9*  PLT 192 189 195 188 146*   Cardiac Enzymes:  Recent Labs Lab 04/14/15 1414  TROPONINI <0.03       CBG:  Recent Labs Lab 04/17/15 0756  GLUCAP 91    Principal Problem:   Nausea and vomiting Active Problems:   End stage renal disease   Dizziness   Uncontrolled hypertension   Orthostatic hypotension   UTI (lower urinary tract infection)   Time coordinating discharge: 40 minutes  Signed:  Murray Hodgkins, MD [facility] 04/17/2015, 6:13 PM

## 2015-04-17 NOTE — Progress Notes (Signed)
Instructions given on discharge medications,and follow up appointments,patient verbalized understanding. Prescription sent with patient. Vital signs stable. Accompanied by staff to an awaiting vehicle.

## 2015-04-17 NOTE — Care Management Important Message (Signed)
Important Message  Patient Details  Name: Sonya [name] MRN: [id number] Date of Birth: 29-Aug-1950   Medicare Important Message Given:  Yes-second notification given    Sherald Barge, RN 04/17/2015, 10:25 AM

## 2015-04-17 NOTE — Progress Notes (Signed)
PROGRESS NOTE  Sonya [name] [id number] DOB: [birth year] DOA: 04/14/2015 PCP: [name], MD  Summary: 64 yo female with h/o ESRD, malignant HTN and anemia presented to the ED with nausea and vomiting. While in the ED pt was hypertensive and had abnormal UA, possible UTI. Pt was admitted for further evaluation and management.  Assessment/Plan: 1. Nausea and vomiting, possibly related to UTI vs HTN. Resolved. Tolerating diet.  2. Visual hallucinations, resolved. Suspect acute encephalopathy/delirium superimposed on undiagnosed dementia. Possibly related to UTI though culture was unrevealing. CT head negative. Per record review pt noted to be confused 10/2014 and was transferred to [facility], seen by neurology and underwent repeat MRI which was without change, neurology thought it was multi-factorial, including hypertensive encephalopathy; EEG abnormal at that time, per documentation: "EEG is slightly abnormal because of the patient's intracranial hemorrhage." recommendations from neurology were for controlling BP, avoid sedating medications. Further history from friend and chart is notable for  recurrent delirium while hospitalized and symptoms suggestive of dementia over the last year. 3. UTI. UA revealed some bacteria. UC shows no growth to date. Treated with Rocephin.  4. ESRD on HD, refused HD today. D/w Dr. Lowanda Foster who states he will coordinate outpatient follow-up for HD in near future.  5. Accelerated/malignant HTN. Resolved. Secondary to missed meds from vomiting, rebound HTN from clonidine. BP is currently stable. Continue to monitor. 6. Anemia of CKD, stable 7. GERD. Continue PPI. 8. Hypothyroid. Continue synthroid.   Overall stable, improved; no hallucinations. Subacute delirium improved. Expect will continue to improve in outpatient setting. Head CT negative, exam nonfocal, patient alert and oriented and correctly recalls details of hospitalization.  Plan home today, continue  antihypertensives and outpatient follow-up.   Murray Hodgkins, MD  [facility]  Pager [phone] If 7PM-7AM, please contact night-coverage at [email or web address], password [facility] 04/17/2015, 7:41 AM  LOS: 3 days   Consultants:  Nephrology  Procedures:    Antibiotics:  Rocephin 9/13 >> 9/15  HPI/Subjective: Feels fine, just upset. Eating well, no pain. No hallucinations.  Interval documentation noted. Patient independently provides complaint of being awoken early and refusing labs, then was unsatisfied with HD.  Feels good, wants to go home.  Objective: Filed Vitals:   04/16/15 1608 04/16/15 1609 04/16/15 2113 04/17/15 0622  BP: 116/57 116/57 134/56 164/64  Pulse:  60 67 71  Temp:   97.8 F (36.6 C) 98.4 F (36.9 C)  TempSrc:   Oral Oral  Resp:   16 20  Height:      Weight:    62 kg (136 lb 11 oz)  SpO2:   99% 97%    Intake/Output Summary (Last 24 hours) at 04/17/15 0741 Last data filed at 04/16/15 2100  Gross per 24 hour  Intake    480 ml  Output    100 ml  Net    380 ml     Filed Weights   04/15/15 0410 04/16/15 0530 04/17/15 0622  Weight: 62.37 kg (137 lb 8 oz) 61.961 kg (136 lb 9.6 oz) 62 kg (136 lb 11 oz)    Exam:  Afebrile, VSS, hypertensive  General:  Appears calm and comfortable Eyes: PERRL, normal lids, irises   ENT: grossly normal hearing, lips & tongue Cardiovascular: RRR, no m/r/g. No LE edema. Telemetry: SR, no arrhythmias  Respiratory: CTA bilaterally, no w/r/r. Normal respiratory effort. Abdomen: soft, ntnd Skin: no rash or induration noted Musculoskeletal: grossly normal tone BUE/BLE, moves all extremities to command Psychiatric: grossly normal mood and affect, speech  fluent and appropriate, oriented to self, location, month, year, president Neurologic:  non-focal.  New data reviewed:  BMP noted, potassium 4.6  CBC stable Hgb, WBC  Pertinent data since admission:  TSH WNL  U/a suggested infection, urine culture  unrevealing  AXR IMPRESSION: 1. Cardiomegaly and interstitial edema. 2. Bilateral pleural effusions. 3. Nonobstructed bowel gas pattern.  Pending data:    Scheduled Meds: . cefTRIAXone (ROCEPHIN)  IV  1 g Intravenous Q24H  . cloNIDine  0.2 mg Oral TID  . docusate sodium  100 mg Oral BID  . enoxaparin (LOVENOX) injection  30 mg Subcutaneous Q24H  . hydrALAZINE  100 mg Oral TID  . labetalol  300 mg Oral TID  . levothyroxine  50 mcg Oral QAC breakfast  . NIFEdipine  90 mg Oral Daily  . pantoprazole  40 mg Oral Daily  . sevelamer carbonate  2,400 mg Oral TID WC  . sevelamer carbonate  800 mg Oral With snacks  . sodium bicarbonate  650 mg Oral BID  . sodium chloride  3 mL Intravenous Q12H   Continuous Infusions: . sodium chloride 10 mL/hr at 04/15/15 0022    Principal Problem:   Nausea and vomiting Active Problems:   End stage renal disease   Dizziness   Uncontrolled hypertension   Orthostatic hypotension   UTI (lower urinary tract infection)   By signing my name below, I, Rennis Harding attest that this documentation has been prepared under the direction and in the presence of Murray Hodgkins, MD Electronically signed: Rennis Harding  .04/17/2015   I have reviewed the above documentation for accuracy and completeness, and I agree with the above. Murray Hodgkins, MD

## 2015-04-19 DIAGNOSIS — N2581 Secondary hyperparathyroidism of renal origin: Secondary | ICD-10-CM | POA: Diagnosis not present

## 2015-04-19 DIAGNOSIS — D631 Anemia in chronic kidney disease: Secondary | ICD-10-CM | POA: Diagnosis not present

## 2015-04-19 DIAGNOSIS — Z992 Dependence on renal dialysis: Secondary | ICD-10-CM | POA: Diagnosis not present

## 2015-04-19 DIAGNOSIS — N186 End stage renal disease: Secondary | ICD-10-CM | POA: Diagnosis not present

## 2015-04-19 DIAGNOSIS — D509 Iron deficiency anemia, unspecified: Secondary | ICD-10-CM | POA: Diagnosis not present

## 2015-04-19 DIAGNOSIS — Z23 Encounter for immunization: Secondary | ICD-10-CM | POA: Diagnosis not present

## 2015-04-22 DIAGNOSIS — Z23 Encounter for immunization: Secondary | ICD-10-CM | POA: Diagnosis not present

## 2015-04-22 DIAGNOSIS — D509 Iron deficiency anemia, unspecified: Secondary | ICD-10-CM | POA: Diagnosis not present

## 2015-04-22 DIAGNOSIS — N186 End stage renal disease: Secondary | ICD-10-CM | POA: Diagnosis not present

## 2015-04-22 DIAGNOSIS — N2581 Secondary hyperparathyroidism of renal origin: Secondary | ICD-10-CM | POA: Diagnosis not present

## 2015-04-22 DIAGNOSIS — D631 Anemia in chronic kidney disease: Secondary | ICD-10-CM | POA: Diagnosis not present

## 2015-04-22 DIAGNOSIS — Z992 Dependence on renal dialysis: Secondary | ICD-10-CM | POA: Diagnosis not present

## 2015-04-24 ENCOUNTER — Encounter (HOSPITAL_COMMUNITY): Payer: Self-pay | Admitting: *Deleted

## 2015-04-24 ENCOUNTER — Emergency Department (HOSPITAL_COMMUNITY)
Admission: EM | Admit: 2015-04-24 | Discharge: 2015-04-24 | Disposition: A | Discharge status: Home / Self Care | Payer: Medicare Other | Attending: Emergency Medicine | Admitting: Emergency Medicine

## 2015-04-24 DIAGNOSIS — Z8669 Personal history of other diseases of the nervous system and sense organs: Secondary | ICD-10-CM | POA: Insufficient documentation

## 2015-04-24 DIAGNOSIS — D649 Anemia, unspecified: Secondary | ICD-10-CM | POA: Insufficient documentation

## 2015-04-24 DIAGNOSIS — N186 End stage renal disease: Secondary | ICD-10-CM | POA: Diagnosis not present

## 2015-04-24 DIAGNOSIS — Z8541 Personal history of malignant neoplasm of cervix uteri: Secondary | ICD-10-CM | POA: Insufficient documentation

## 2015-04-24 DIAGNOSIS — I10 Essential (primary) hypertension: Secondary | ICD-10-CM

## 2015-04-24 DIAGNOSIS — Z992 Dependence on renal dialysis: Secondary | ICD-10-CM | POA: Diagnosis not present

## 2015-04-24 DIAGNOSIS — I120 Hypertensive chronic kidney disease with stage 5 chronic kidney disease or end stage renal disease: Secondary | ICD-10-CM | POA: Diagnosis not present

## 2015-04-24 DIAGNOSIS — E039 Hypothyroidism, unspecified: Secondary | ICD-10-CM | POA: Insufficient documentation

## 2015-04-24 DIAGNOSIS — Z23 Encounter for immunization: Secondary | ICD-10-CM | POA: Diagnosis not present

## 2015-04-24 DIAGNOSIS — N2581 Secondary hyperparathyroidism of renal origin: Secondary | ICD-10-CM | POA: Insufficient documentation

## 2015-04-24 DIAGNOSIS — K219 Gastro-esophageal reflux disease without esophagitis: Secondary | ICD-10-CM | POA: Diagnosis not present

## 2015-04-24 DIAGNOSIS — D631 Anemia in chronic kidney disease: Secondary | ICD-10-CM | POA: Diagnosis not present

## 2015-04-24 DIAGNOSIS — Z79899 Other long term (current) drug therapy: Secondary | ICD-10-CM | POA: Diagnosis not present

## 2015-04-24 DIAGNOSIS — D509 Iron deficiency anemia, unspecified: Secondary | ICD-10-CM | POA: Diagnosis not present

## 2015-04-24 LAB — BASIC METABOLIC PANEL
Anion gap: 6 (ref 5–15)
BUN: 14 mg/dL (ref 6–20)
CHLORIDE: 99 mmol/L — AB (ref 101–111)
CO2: 30 mmol/L (ref 22–32)
Calcium: 8.7 mg/dL — ABNORMAL LOW (ref 8.9–10.3)
Creatinine, Ser: 4.75 mg/dL — ABNORMAL HIGH (ref 0.44–1.00)
GFR, EST AFRICAN AMERICAN: 10 mL/min — AB (ref >60)
GFR, EST NON AFRICAN AMERICAN: 9 mL/min — AB (ref >60)
Glucose, Bld: 105 mg/dL — ABNORMAL HIGH (ref 65–99)
POTASSIUM: 4.2 mmol/L (ref 3.5–5.1)
SODIUM: 135 mmol/L (ref 135–145)

## 2015-04-24 MED ORDER — MECLIZINE HCL 12.5 MG PO TABS
25.0000 mg | ORAL_TABLET | Freq: Once | ORAL | Status: AC
  Administered 2015-04-24: 25 mg via ORAL
  Filled 2015-04-24: qty 2

## 2015-04-24 MED ORDER — DILTIAZEM HCL 30 MG PO TABS
90.0000 mg | ORAL_TABLET | Freq: Once | ORAL | Status: AC
  Administered 2015-04-24: 90 mg via ORAL
  Filled 2015-04-24: qty 3

## 2015-04-24 MED ORDER — CLONIDINE HCL 0.2 MG PO TABS
0.2000 mg | ORAL_TABLET | Freq: Once | ORAL | Status: AC
Start: 2015-04-24 — End: 2015-04-24
  Administered 2015-04-24: 0.2 mg via ORAL
  Filled 2015-04-24: qty 1

## 2015-04-24 MED ORDER — CLONIDINE HCL 0.1 MG PO TABS
0.1000 mg | ORAL_TABLET | Freq: Once | ORAL | Status: AC
  Administered 2015-04-24: 0.1 mg via ORAL
  Filled 2015-04-24: qty 1

## 2015-04-24 NOTE — Discharge Instructions (Signed)
Hypertension Hypertension is another name for high blood pressure. High blood pressure forces your heart to work harder to pump blood. A blood pressure reading has two numbers, which includes a higher number over a lower number (example: 110/72). HOME CARE   Have your blood pressure rechecked by your doctor.  Only take medicine as told by your doctor. Follow the directions carefully. The medicine does not work as well if you skip doses. Skipping doses also puts you at risk for problems.  Do not smoke.  Monitor your blood pressure at home as told by your doctor. GET HELP IF:  You think you are having a reaction to the medicine you are taking.  You have repeat headaches or feel dizzy.  You have puffiness (swelling) in your ankles.  You have trouble with your vision. GET HELP RIGHT AWAY IF:   You get a very bad headache and are confused.  You feel weak, numb, or faint.  You get chest or belly (abdominal) pain.  You throw up (vomit).  You cannot breathe very well. MAKE SURE YOU:   Understand these instructions.  Will watch your condition.  Will get help right away if you are not doing well or get worse. Document Released: 01/05/2008 Document Revised: 07/24/2013 Document Reviewed: 05/11/2013 Exit[employer] Patient Information 2015 [employer]. This information is not intended to replace advice given to you by your health care provider. Make sure you discuss any questions you have with your health care provider.  

## 2015-04-24 NOTE — ED Notes (Signed)
PT c/o dizziness and requesting home medication of meclizine to be ordered. MD made aware and new orders received.

## 2015-04-24 NOTE — ED Provider Notes (Signed)
CSN: [id number]     Arrival date & time 04/24/15  1007 History   First MD Initiated Contact with Patient 04/24/15 1016     Chief Complaint  Patient presents with  . Hypertension     HPI Patient presents with chief complaint of high blood pressure.  Patient was at dialysis this morning apparently had around 2-1/2 hours which is a little short for her.  Denies chest pain or severe headache.  She says history of hypertension in the past associated with dialysis. Past Medical History  Diagnosis Date  . HTN (hypertension)   . Hypothyroidism   . Uterine cancer 2012  . Anemia   . CKD (chronic kidney disease)   . Dysphagia   . Hyperparathyroidism due to renal insufficiency   . GERD (gastroesophageal reflux disease)   . Diverticulitis   . Vertical diplopia    Past Surgical History  Procedure Laterality Date  . Cholecystectomy    . Cesarean section    . Laparoscopic total hysterectomy    . Colonoscopy  2000    TICS, IH  . Colonoscopy  2003 NUR BRBPR D50 V6    DC/Section TICS, IH  . Abdominal hysterectomy    . Nasal septum surgery    . Esophagogastroduodenoscopy (egd) with esophageal dilation N/A 06/08/2013    Dr. Fields:Stricture at the gastroesophageal junction/MILD NON-erosive gastritis (inflammation) was found in the gastric antrum; multiple biopsies/NO SOURCE FOR ANEMIA IDETIFIED-MOST LIKELY DUE TO ANEMIA OF CHRONIC DISEASE  . Colonoscopy N/A 09/04/2013    Procedure: COLONOSCOPY;  Surgeon: Danie Binder, MD;  [facility];  Service: Endoscopy;  Laterality: N/A;  10:30AM-moved to 9:25 [name]  . Appendectomy    . Bascilic vein transposition Right 09/17/2013    Procedure: BRACHIAL VEIN TRANSPOSITION;  Surgeon: Conrad [place], MD;  Location: [place];  Service: Vascular;  Laterality: Right;  . Insertion of dialysis catheter Left 09/17/2013    Procedure: INSERTION OF DIALYSIS CATHETER;  Surgeon: Conrad Seibert, MD;  Location: [place];  Service: Vascular;  Laterality: Left;  .  Bascilic vein transposition Right 10/29/2013    Procedure: RIGHT SECOND STAGE BRACHIAL VEIN TRANSPOSITION;  Surgeon: Conrad Luis M. Cintron, MD;  Location: [facility];  Service: Vascular;  Laterality: Right;  . Givens capsule study N/A 12/03/2013    Procedure: GIVENS CAPSULE STUDY;  Surgeon: Danie Binder, MD;  [facility];  Service: Endoscopy;  Laterality: N/A;  7:30   Family History  Problem Relation Age of Onset  . Colon cancer Neg Hx   . Cancer Mother   . Diabetes Mother   . Hypertension Mother   . Diabetes Father   . Hypertension Father   . Hypertension Sister    Social History  Substance Use Topics  . Smoking status: Never Smoker   . Smokeless tobacco: Never Used  . Alcohol Use: No   OB History    Gravida Para Term Preterm AB TAB SAB Ectopic Multiple Living   1 1 1             Review of Systems  All other systems reviewed and are negative  Allergies  Cephalexin  Home Medications   Prior to Admission medications   Medication Sig Start Date End Date Taking? Authorizing Provider  allopurinol (ZYLOPRIM) 300 MG tablet Take 300 mg by mouth daily as needed (for gout).    Yes Historical Provider, MD  cloNIDine (CATAPRES) 0.2 MG tablet Take 1 tablet by mouth 3 (three)  times daily. 11/22/14  Yes Historical Provider, MD  docusate sodium (COLACE) 100 MG capsule Take 100 mg by mouth 2 (two) times daily.   Yes Historical Provider, MD  ferrous sulfate dried (SLOW FE) 160 (50 FE) MG TBCR SR tablet Take 640 mg by mouth daily.   Yes Historical Provider, MD  hydrALAZINE (APRESOLINE) 100 MG tablet Take 100 mg by mouth 3 (three) times daily. 04/09/15  Yes Historical Provider, MD  levothyroxine (SYNTHROID, LEVOTHROID) 50 MCG tablet Take 1 tablet (50 mcg total) by mouth daily. 04/17/15  Yes Samuella Cota, MD  lidocaine-prilocaine (EMLA) cream Apply 1 application topically as needed (on Dialysis days Tues,Thurs.,Sat.).   Yes Historical Provider, MD  meclizine (ANTIVERT) 25 MG tablet Take 1 tablet  by mouth 4 (four) times daily as needed. dizziness 11/22/14  Yes Historical Provider, MD  NIFEdipine (PROCARDIA XL/ADALAT-CC) 90 MG 24 hr tablet Take 90 mg by mouth daily. 04/10/15  Yes Historical Provider, MD  omeprazole (PRILOSEC) 20 MG capsule Take 1 capsule (20 mg total) by mouth daily. 07/29/14  Yes Orvil Feil, NP  sevelamer carbonate (RENVELA) 800 MG tablet Take 800-2,400 mg by mouth 3 (three) times daily with meals. Patient takes 1 tablet with snack and 3 tablets with meal   Yes Historical Provider, MD  sodium bicarbonate 650 MG tablet Take 1 tablet (650 mg total) by mouth 2 (two) times daily. Patient taking differently: Take 650 mg by mouth daily.  06/12/13  Yes Kathie Dike, MD  labetalol (NORMODYNE) 300 MG tablet Take 1 tablet (300 mg total) by mouth 3 (three) times daily. Patient not taking: Reported on 04/24/2015 08/20/13   Kinnie Feil, MD   BP 227/93 mmHg  Pulse 77  Temp(Src) 98 F (36.7 C) (Oral)  Resp 22  Ht 5\' 6"  (1.676 m)  Wt 130 lb (58.968 kg)  BMI 20.99 kg/m2  SpO2 96% Physical Exam Physical Exam  Nursing note and vitals reviewed. Constitutional: She is oriented to person, place, and time. She appears well-developed and well-nourished. No distress.  HENT:  Head: Normocephalic and atraumatic.  Eyes: Pupils are equal, round, and reactive to light.  Neck: Normal range of motion.  Cardiovascular: Normal rate and intact distal pulses.   Pulmonary/Chest: No respiratory distress.  Abdominal: Normal appearance. She exhibits no distension.  Musculoskeletal: Normal range of motion.  Neurological: She is alert and oriented to person, place, and time. No cranial nerve deficit.  Skin: Skin is warm and dry. No rash noted.  Psychiatric: She has a normal mood and affect. Her behavior is normal.   ED Course  Procedures (including critical care time) Medications  cloNIDine (CATAPRES) tablet 0.2 mg (0.2 mg Oral Given 04/24/15 1031)  cloNIDine (CATAPRES) tablet 0.1 mg (0.1 mg  Oral Given 04/24/15 1144)  cloNIDine (CATAPRES) tablet 0.1 mg (0.1 mg Oral Given 04/24/15 1217)  meclizine (ANTIVERT) tablet 25 mg (25 mg Oral Given 04/24/15 1242)  cloNIDine (CATAPRES) tablet 0.1 mg (0.1 mg Oral Given 04/24/15 1251)  diltiazem (CARDIZEM) tablet 90 mg (90 mg Oral Given 04/24/15 1327)    Labs Review Labs Reviewed  BASIC METABOLIC PANEL - Abnormal; Notable for the following:    Chloride 99 (*)    Glucose, Bld 105 (*)    Creatinine, Ser 4.75 (*)    Calcium 8.7 (*)    GFR calc non Af Amer 9 (*)    GFR calc Af Amer 10 (*)    All other components within normal limits    Imaging Review  No results found. I have personally reviewed and evaluated these images and lab results as part of my medical decision-making.   EKG Interpretation   Date/Time:  Thursday April 24 2015 10:18:43 EDT Ventricular Rate:  81 PR Interval:  217 QRS Duration: 74 QT Interval:  402 QTC Calculation: 467 R Axis:   59 Text Interpretation:  Sinus rhythm Borderline prolonged PR interval  Biatrial enlargement Probable left ventricular hypertrophy Confirmed by  BEATON  MD, ROBERT ([id number]) on 04/24/2015 10:31:46 AM     After treatment in the ED the patient feels back to baseline and wants to go home. MDM   Final diagnoses:  Essential hypertension  ESRD (end stage renal disease)        Leonard Schwartz, MD 04/24/15 [phone]

## 2015-04-24 NOTE — ED Notes (Signed)
Pt c/o feeling dizzy, requesting meclizine.  Notified Dr. Audie Pinto and he will put the order in.

## 2015-04-24 NOTE — ED Notes (Signed)
Pt was at dialysis this morning and while there her BP began to increase. Pt made it 2 hours through dialysis. Pt c/o of intermittent dizziness. NAD noted. Pt is alert and oriented at this time.

## 2015-04-25 ENCOUNTER — Encounter (HOSPITAL_COMMUNITY): Payer: Self-pay | Admitting: *Deleted

## 2015-04-25 ENCOUNTER — Emergency Department (HOSPITAL_COMMUNITY)
Admission: EM | Admit: 2015-04-25 | Discharge: 2015-04-25 | Disposition: A | Discharge status: Home / Self Care | Payer: Medicare Other | Attending: Emergency Medicine | Admitting: Emergency Medicine

## 2015-04-25 DIAGNOSIS — Z8541 Personal history of malignant neoplasm of cervix uteri: Secondary | ICD-10-CM | POA: Insufficient documentation

## 2015-04-25 DIAGNOSIS — R42 Dizziness and giddiness: Secondary | ICD-10-CM | POA: Diagnosis not present

## 2015-04-25 DIAGNOSIS — D649 Anemia, unspecified: Secondary | ICD-10-CM | POA: Insufficient documentation

## 2015-04-25 DIAGNOSIS — N189 Chronic kidney disease, unspecified: Secondary | ICD-10-CM | POA: Insufficient documentation

## 2015-04-25 DIAGNOSIS — I129 Hypertensive chronic kidney disease with stage 1 through stage 4 chronic kidney disease, or unspecified chronic kidney disease: Secondary | ICD-10-CM | POA: Diagnosis not present

## 2015-04-25 DIAGNOSIS — Z8669 Personal history of other diseases of the nervous system and sense organs: Secondary | ICD-10-CM | POA: Insufficient documentation

## 2015-04-25 DIAGNOSIS — Z79899 Other long term (current) drug therapy: Secondary | ICD-10-CM | POA: Diagnosis not present

## 2015-04-25 DIAGNOSIS — Z992 Dependence on renal dialysis: Secondary | ICD-10-CM | POA: Insufficient documentation

## 2015-04-25 DIAGNOSIS — E039 Hypothyroidism, unspecified: Secondary | ICD-10-CM | POA: Insufficient documentation

## 2015-04-25 DIAGNOSIS — K219 Gastro-esophageal reflux disease without esophagitis: Secondary | ICD-10-CM | POA: Insufficient documentation

## 2015-04-25 DIAGNOSIS — I10 Essential (primary) hypertension: Secondary | ICD-10-CM

## 2015-04-25 HISTORY — DX: Dizziness and giddiness: R42

## 2015-04-25 MED ORDER — MECLIZINE HCL 12.5 MG PO TABS
25.0000 mg | ORAL_TABLET | Freq: Once | ORAL | Status: AC
  Administered 2015-04-25: 25 mg via ORAL
  Filled 2015-04-25: qty 2

## 2015-04-25 MED ORDER — HYDRALAZINE HCL 25 MG PO TABS
100.0000 mg | ORAL_TABLET | Freq: Once | ORAL | Status: AC
  Administered 2015-04-25: 100 mg via ORAL
  Filled 2015-04-25: qty 4

## 2015-04-25 MED ORDER — LABETALOL HCL 200 MG PO TABS
300.0000 mg | ORAL_TABLET | Freq: Once | ORAL | Status: AC
  Administered 2015-04-25: 300 mg via ORAL
  Filled 2015-04-25: qty 2

## 2015-04-25 NOTE — ED Notes (Signed)
MD at bedside. 

## 2015-04-25 NOTE — ED Notes (Signed)
Pt states she was sent here yesterday from dialysis due to blood pressure. Pt states dizziness began yesterday while she was here. States dizziness became worse last night and was present when she woke up this morning. Pt states history of "inner ear" and previous stroke as well. Pt states she feels disoriented and states, "I just don't feel right". Dizziness is described as spinning.

## 2015-04-25 NOTE — Discharge Instructions (Signed)
Follow up with your md as planned monday

## 2015-04-25 NOTE — ED Notes (Signed)
Ambulated pt around the nurses station; pt states: " My head is not spinning any more, but I still feel dizzy when I walk."

## 2015-04-25 NOTE — ED Provider Notes (Addendum)
CSN: [id number]     Arrival date & time 04/25/15  [id number] History  This chart was scribed for [name], MD by [name], ED Scribe. This patient was seen in room [facility]05/[facility]05 and the patient's care was started at 9:52 AM.   Chief Complaint  Patient presents with  . Dizziness   Patient is a 64 y.o. female presenting with dizziness. The history is provided by the patient. No language interpreter was used.  Dizziness Quality:  Room spinning and vertigo Severity:  Moderate Onset quality:  Sudden Timing:  Intermittent Progression:  Unchanged Chronicity:  Recurrent Context: physical activity and standing up   Relieved by:  Lying down Worsened by:  Movement and standing up Associated symptoms: no chest pain, no diarrhea and no headaches   Risk factors: anemia, heart disease, hx of vertigo and multiple medications    PCP: [name], MD HPI Comments: Sonya [name] is a 64 y.o. female, with PMHx noted below including vertigo, anemia, CKD, HTN, uterine cancer, and on dialysis 3x a week for the past 1.5 years, who presents to the Emergency Department complaining of intermittent dizziness onset this morning. Pt reports standing and walking brings on each episode of dizziness; sitting or lying down resolves the episodes. Pt denies taking any of her daily meds today. Pt reports she takes Meclizine for vertigo. Pt denies nausea or any pain at this time. Pt notes her dialysis appointment was stopped early yesterday and she was directed to the ED due to elevated BP. Pt reports she is making up yesterday's dialysis appointment tomorrow.   Past Medical History  Diagnosis Date  . HTN (hypertension)   . Hypothyroidism   . Uterine cancer 2012  . Anemia   . CKD (chronic kidney disease)   . Dysphagia   . Hyperparathyroidism due to renal insufficiency   . GERD (gastroesophageal reflux disease)   . Diverticulitis   . Vertical diplopia   . Vertigo    Past Surgical History  Procedure  Laterality Date  . Cholecystectomy    . Cesarean section    . Laparoscopic total hysterectomy    . Colonoscopy  2000    TICS, IH  . Colonoscopy  2003 NUR BRBPR D50 V6    DC/[place] TICS, IH  . Abdominal hysterectomy    . Nasal septum surgery    . Esophagogastroduodenoscopy (egd) with esophageal dilation N/A 06/08/2013    Dr. Fields:Stricture at the gastroesophageal junction/MILD NON-erosive gastritis (inflammation) was found in the gastric antrum; multiple biopsies/NO SOURCE FOR ANEMIA IDETIFIED-MOST LIKELY DUE TO ANEMIA OF CHRONIC DISEASE  . Colonoscopy N/A 09/04/2013    Procedure: COLONOSCOPY;  Surgeon: Danie Binder, MD;  [facility];  Service: Endoscopy;  Laterality: N/A;  10:30AM-moved to 9:25 [name]  . Appendectomy    . Bascilic vein transposition Right 09/17/2013    Procedure: BRACHIAL VEIN TRANSPOSITION;  Surgeon: Conrad Aransas, MD;  Location: [place];  Service: Vascular;  Laterality: Right;  . Insertion of dialysis catheter Left 09/17/2013    Procedure: INSERTION OF DIALYSIS CATHETER;  Surgeon: Conrad [place], MD;  Location: [place];  Service: Vascular;  Laterality: Left;  . Bascilic vein transposition Right 10/29/2013    Procedure: RIGHT SECOND STAGE BRACHIAL VEIN TRANSPOSITION;  Surgeon: Conrad Maywood, MD;  Location: [place];  Service: Vascular;  Laterality: Right;  . Givens capsule study N/A 12/03/2013    Procedure: GIVENS CAPSULE STUDY;  Surgeon: Danie Binder, MD;  [facility];  Service: Endoscopy;  Laterality: N/A;  7:30   Family History  Problem Relation Age of Onset  . Colon cancer Neg Hx   . Cancer Mother   . Diabetes Mother   . Hypertension Mother   . Diabetes Father   . Hypertension Father   . Hypertension Sister    Social History  Substance Use Topics  . Smoking status: Never Smoker   . Smokeless tobacco: Never Used  . Alcohol Use: No   OB History    Gravida Para Term Preterm AB TAB SAB Ectopic Multiple Living   1 1 1              Review of Systems  Constitutional: Negative for appetite change and fatigue.  HENT: Negative for congestion, ear discharge and sinus pressure.   Eyes: Negative for discharge.  Respiratory: Negative for cough.   Cardiovascular: Negative for chest pain.  Gastrointestinal: Negative for abdominal pain and diarrhea.  Genitourinary: Negative for frequency and hematuria.  Musculoskeletal: Negative for back pain.  Skin: Negative for rash.  Neurological: Positive for dizziness. Negative for seizures and headaches.  Psychiatric/Behavioral: Negative for hallucinations.   Allergies  Cephalexin  Home Medications   Prior to Admission medications   Medication Sig Start Date End Date Taking? Authorizing Provider  allopurinol (ZYLOPRIM) 300 MG tablet Take 300 mg by mouth daily as needed (for gout).     Historical Provider, MD  cloNIDine (CATAPRES) 0.2 MG tablet Take 1 tablet by mouth 3 (three) times daily. 11/22/14   Historical Provider, MD  docusate sodium (COLACE) 100 MG capsule Take 100 mg by mouth 2 (two) times daily.    Historical Provider, MD  ferrous sulfate dried (SLOW FE) 160 (50 FE) MG TBCR SR tablet Take 640 mg by mouth daily.    Historical Provider, MD  hydrALAZINE (APRESOLINE) 100 MG tablet Take 100 mg by mouth 3 (three) times daily. 04/09/15   Historical Provider, MD  labetalol (NORMODYNE) 300 MG tablet Take 1 tablet (300 mg total) by mouth 3 (three) times daily. Patient not taking: Reported on 04/24/2015 08/20/13   Kinnie Feil, MD  levothyroxine (SYNTHROID, LEVOTHROID) 50 MCG tablet Take 1 tablet (50 mcg total) by mouth daily. 04/17/15   Samuella Cota, MD  lidocaine-prilocaine (EMLA) cream Apply 1 application topically as needed (on Dialysis days Tues,Thurs.,Sat.).    Historical Provider, MD  meclizine (ANTIVERT) 25 MG tablet Take 1 tablet by mouth 4 (four) times daily as needed. dizziness 11/22/14   Historical Provider, MD  NIFEdipine (PROCARDIA XL/ADALAT-CC) 90 MG 24 hr tablet  Take 90 mg by mouth daily. 04/10/15   Historical Provider, MD  omeprazole (PRILOSEC) 20 MG capsule Take 1 capsule (20 mg total) by mouth daily. 07/29/14   Orvil Feil, NP  sevelamer carbonate (RENVELA) 800 MG tablet Take 800-2,400 mg by mouth 3 (three) times daily with meals. Patient takes 1 tablet with snack and 3 tablets with meal    Historical Provider, MD  sodium bicarbonate 650 MG tablet Take 1 tablet (650 mg total) by mouth 2 (two) times daily. Patient taking differently: Take 650 mg by mouth daily.  06/12/13   Kathie Dike, MD   Triage Vitals: BP 182/80 mmHg  Pulse 72  Temp(Src) 98.4 F (36.9 C) (Oral)  Resp 16  SpO2 95% Physical Exam  Constitutional: She is oriented to person, place, and time. She appears well-developed.  HENT:  Head: Normocephalic.  Eyes: Conjunctivae and EOM are normal. No scleral icterus.  Neck:  Neck supple. No thyromegaly present.  Cardiovascular: Normal rate and regular rhythm.  Exam reveals no gallop and no friction rub.   No murmur heard. Pulmonary/Chest: No stridor. She has no wheezes. She has no rales. She exhibits no tenderness.  Abdominal: She exhibits no distension. There is no tenderness. There is no rebound.  Musculoskeletal: Normal range of motion. She exhibits no edema.  Lymphadenopathy:    She has no cervical adenopathy.  Neurological: She is oriented to person, place, and time. She exhibits normal muscle tone. Coordination normal.  Skin: No rash noted. No erythema.  dialysis access to right UE   Psychiatric: She has a normal mood and affect. Her behavior is normal.    ED Course  Procedures (including critical care time) DIAGNOSTIC STUDIES: Oxygen Saturation is 95% on RA, adequate by my interpretation.    COORDINATION OF CARE: 9:59 AM: Discussed treatment plan which includes BP meds and Meclizine, with pt at bedside; patient verbalizes understanding and agrees with treatment plan.  MDM   Final diagnoses:  None    Vertigo,   tx  with antivert.  Patient improved with the Antivert in the emergency department. Her blood pressure improved when she was given her normal prescription. Patient fell fine at discharge. She will follow-up Monday as planned with her M.D.  [name], MD 04/25/15 1252     The chart was scribed for me under my direct supervision.  I personally performed the history, physical, and medical decision making and all procedures in the evaluation of this patient.[name], MD 04/25/15 [phone]

## 2015-04-26 DIAGNOSIS — N186 End stage renal disease: Secondary | ICD-10-CM | POA: Diagnosis not present

## 2015-04-26 DIAGNOSIS — N2581 Secondary hyperparathyroidism of renal origin: Secondary | ICD-10-CM | POA: Diagnosis not present

## 2015-04-26 DIAGNOSIS — Z992 Dependence on renal dialysis: Secondary | ICD-10-CM | POA: Diagnosis not present

## 2015-04-26 DIAGNOSIS — D631 Anemia in chronic kidney disease: Secondary | ICD-10-CM | POA: Diagnosis not present

## 2015-04-26 DIAGNOSIS — D509 Iron deficiency anemia, unspecified: Secondary | ICD-10-CM | POA: Diagnosis not present

## 2015-04-26 DIAGNOSIS — Z23 Encounter for immunization: Secondary | ICD-10-CM | POA: Diagnosis not present

## 2015-04-28 ENCOUNTER — Other Ambulatory Visit (HOSPITAL_COMMUNITY): Payer: Self-pay | Admitting: Internal Medicine

## 2015-04-28 DIAGNOSIS — N185 Chronic kidney disease, stage 5: Secondary | ICD-10-CM | POA: Diagnosis not present

## 2015-04-28 DIAGNOSIS — Z6822 Body mass index (BMI) 22.0-22.9, adult: Secondary | ICD-10-CM | POA: Diagnosis not present

## 2015-04-28 DIAGNOSIS — E063 Autoimmune thyroiditis: Secondary | ICD-10-CM | POA: Diagnosis not present

## 2015-04-28 DIAGNOSIS — Z1389 Encounter for screening for other disorder: Secondary | ICD-10-CM | POA: Diagnosis not present

## 2015-04-28 DIAGNOSIS — K469 Unspecified abdominal hernia without obstruction or gangrene: Secondary | ICD-10-CM | POA: Diagnosis not present

## 2015-04-28 DIAGNOSIS — I10 Essential (primary) hypertension: Secondary | ICD-10-CM | POA: Diagnosis not present

## 2015-04-28 DIAGNOSIS — Z1231 Encounter for screening mammogram for malignant neoplasm of breast: Secondary | ICD-10-CM

## 2015-04-28 DIAGNOSIS — M8580 Other specified disorders of bone density and structure, unspecified site: Secondary | ICD-10-CM

## 2015-04-28 DIAGNOSIS — F0390 Unspecified dementia without behavioral disturbance: Secondary | ICD-10-CM | POA: Diagnosis not present

## 2015-04-28 DIAGNOSIS — R42 Dizziness and giddiness: Secondary | ICD-10-CM | POA: Diagnosis not present

## 2015-04-29 DIAGNOSIS — Z992 Dependence on renal dialysis: Secondary | ICD-10-CM | POA: Diagnosis not present

## 2015-04-29 DIAGNOSIS — D631 Anemia in chronic kidney disease: Secondary | ICD-10-CM | POA: Diagnosis not present

## 2015-04-29 DIAGNOSIS — N186 End stage renal disease: Secondary | ICD-10-CM | POA: Diagnosis not present

## 2015-04-29 DIAGNOSIS — N2581 Secondary hyperparathyroidism of renal origin: Secondary | ICD-10-CM | POA: Diagnosis not present

## 2015-04-29 DIAGNOSIS — Z23 Encounter for immunization: Secondary | ICD-10-CM | POA: Diagnosis not present

## 2015-04-29 DIAGNOSIS — D509 Iron deficiency anemia, unspecified: Secondary | ICD-10-CM | POA: Diagnosis not present

## 2015-05-01 DIAGNOSIS — D631 Anemia in chronic kidney disease: Secondary | ICD-10-CM | POA: Diagnosis not present

## 2015-05-01 DIAGNOSIS — N186 End stage renal disease: Secondary | ICD-10-CM | POA: Diagnosis not present

## 2015-05-01 DIAGNOSIS — D509 Iron deficiency anemia, unspecified: Secondary | ICD-10-CM | POA: Diagnosis not present

## 2015-05-01 DIAGNOSIS — Z992 Dependence on renal dialysis: Secondary | ICD-10-CM | POA: Diagnosis not present

## 2015-05-01 DIAGNOSIS — Z23 Encounter for immunization: Secondary | ICD-10-CM | POA: Diagnosis not present

## 2015-05-01 DIAGNOSIS — N2581 Secondary hyperparathyroidism of renal origin: Secondary | ICD-10-CM | POA: Diagnosis not present

## 2015-05-02 DIAGNOSIS — N186 End stage renal disease: Secondary | ICD-10-CM | POA: Diagnosis not present

## 2015-05-02 DIAGNOSIS — Z992 Dependence on renal dialysis: Secondary | ICD-10-CM | POA: Diagnosis not present

## 2015-05-03 DIAGNOSIS — K219 Gastro-esophageal reflux disease without esophagitis: Secondary | ICD-10-CM

## 2015-05-03 DIAGNOSIS — N186 End stage renal disease: Secondary | ICD-10-CM | POA: Diagnosis not present

## 2015-05-03 DIAGNOSIS — K222 Esophageal obstruction: Secondary | ICD-10-CM

## 2015-05-03 DIAGNOSIS — D509 Iron deficiency anemia, unspecified: Secondary | ICD-10-CM | POA: Diagnosis not present

## 2015-05-03 DIAGNOSIS — N2581 Secondary hyperparathyroidism of renal origin: Secondary | ICD-10-CM | POA: Diagnosis not present

## 2015-05-03 DIAGNOSIS — Z992 Dependence on renal dialysis: Secondary | ICD-10-CM | POA: Diagnosis not present

## 2015-05-03 DIAGNOSIS — D631 Anemia in chronic kidney disease: Secondary | ICD-10-CM | POA: Diagnosis not present

## 2015-05-03 HISTORY — DX: Gastro-esophageal reflux disease without esophagitis: K21.9

## 2015-05-03 HISTORY — DX: Esophageal obstruction: K22.2

## 2015-05-06 DIAGNOSIS — D509 Iron deficiency anemia, unspecified: Secondary | ICD-10-CM | POA: Diagnosis not present

## 2015-05-06 DIAGNOSIS — Z992 Dependence on renal dialysis: Secondary | ICD-10-CM | POA: Diagnosis not present

## 2015-05-06 DIAGNOSIS — N2581 Secondary hyperparathyroidism of renal origin: Secondary | ICD-10-CM | POA: Diagnosis not present

## 2015-05-06 DIAGNOSIS — N186 End stage renal disease: Secondary | ICD-10-CM | POA: Diagnosis not present

## 2015-05-06 DIAGNOSIS — D631 Anemia in chronic kidney disease: Secondary | ICD-10-CM | POA: Diagnosis not present

## 2015-05-08 DIAGNOSIS — D509 Iron deficiency anemia, unspecified: Secondary | ICD-10-CM | POA: Diagnosis not present

## 2015-05-08 DIAGNOSIS — N2581 Secondary hyperparathyroidism of renal origin: Secondary | ICD-10-CM | POA: Diagnosis not present

## 2015-05-08 DIAGNOSIS — D631 Anemia in chronic kidney disease: Secondary | ICD-10-CM | POA: Diagnosis not present

## 2015-05-08 DIAGNOSIS — Z992 Dependence on renal dialysis: Secondary | ICD-10-CM | POA: Diagnosis not present

## 2015-05-08 DIAGNOSIS — N186 End stage renal disease: Secondary | ICD-10-CM | POA: Diagnosis not present

## 2015-05-09 ENCOUNTER — Ambulatory Visit (HOSPITAL_COMMUNITY): Payer: Medicare Other

## 2015-05-09 ENCOUNTER — Ambulatory Visit (HOSPITAL_COMMUNITY)
Admission: RE | Admit: 2015-05-09 | Discharge: 2015-05-09 | Disposition: A | Discharge status: Home / Self Care | Payer: Medicare Other | Source: Ambulatory Visit | Attending: Internal Medicine | Admitting: Internal Medicine

## 2015-05-09 DIAGNOSIS — Z1231 Encounter for screening mammogram for malignant neoplasm of breast: Secondary | ICD-10-CM | POA: Insufficient documentation

## 2015-05-09 DIAGNOSIS — Z780 Asymptomatic menopausal state: Secondary | ICD-10-CM | POA: Diagnosis not present

## 2015-05-09 DIAGNOSIS — M818 Other osteoporosis without current pathological fracture: Secondary | ICD-10-CM | POA: Diagnosis not present

## 2015-05-09 DIAGNOSIS — M8580 Other specified disorders of bone density and structure, unspecified site: Secondary | ICD-10-CM

## 2015-05-10 DIAGNOSIS — D509 Iron deficiency anemia, unspecified: Secondary | ICD-10-CM | POA: Diagnosis not present

## 2015-05-10 DIAGNOSIS — D631 Anemia in chronic kidney disease: Secondary | ICD-10-CM | POA: Diagnosis not present

## 2015-05-10 DIAGNOSIS — Z992 Dependence on renal dialysis: Secondary | ICD-10-CM | POA: Diagnosis not present

## 2015-05-10 DIAGNOSIS — N2581 Secondary hyperparathyroidism of renal origin: Secondary | ICD-10-CM | POA: Diagnosis not present

## 2015-05-10 DIAGNOSIS — N186 End stage renal disease: Secondary | ICD-10-CM | POA: Diagnosis not present

## 2015-05-13 ENCOUNTER — Other Ambulatory Visit: Payer: Self-pay

## 2015-05-13 ENCOUNTER — Ambulatory Visit (INDEPENDENT_AMBULATORY_CARE_PROVIDER_SITE_OTHER): Payer: Medicare Other | Admitting: Gastroenterology

## 2015-05-13 ENCOUNTER — Encounter: Payer: Self-pay | Admitting: Gastroenterology

## 2015-05-13 VITALS — BP 181/84 | HR 87 | Temp 97.7°F | Ht 66.0 in | Wt 136.6 lb

## 2015-05-13 DIAGNOSIS — N2581 Secondary hyperparathyroidism of renal origin: Secondary | ICD-10-CM | POA: Diagnosis not present

## 2015-05-13 DIAGNOSIS — D509 Iron deficiency anemia, unspecified: Secondary | ICD-10-CM | POA: Diagnosis not present

## 2015-05-13 DIAGNOSIS — R634 Abnormal weight loss: Secondary | ICD-10-CM

## 2015-05-13 DIAGNOSIS — R112 Nausea with vomiting, unspecified: Secondary | ICD-10-CM

## 2015-05-13 DIAGNOSIS — D631 Anemia in chronic kidney disease: Secondary | ICD-10-CM | POA: Diagnosis not present

## 2015-05-13 DIAGNOSIS — Z992 Dependence on renal dialysis: Secondary | ICD-10-CM | POA: Diagnosis not present

## 2015-05-13 DIAGNOSIS — N186 End stage renal disease: Secondary | ICD-10-CM | POA: Diagnosis not present

## 2015-05-13 MED ORDER — METOCLOPRAMIDE HCL 5 MG PO TABS: ORAL_TABLET | ORAL | Status: DC

## 2015-05-13 NOTE — Progress Notes (Signed)
Subjective:    Patient ID: Sonya [name], female    DOB: Jan 27, 1951, 64 y.o.   MRN: [id number]  [name], MD  HPI PROBLEMS WITH NAUSEA/VOMITING FOR PAST MO. HAS VOMITING MORE DAYS THAN NOT. WEIGHT LOSS: LOST 20 LBS SINCE SEP 2015. ON HD MWF. WHEN SHE DOES THROW UP , TAKES MEDICINE STARTS GETTING SICK. HAPPENS MOST MORNINGS, BUT CAN BE IN EVENINGS OR NIGHT. NO BLOOD IN VOMIT. DOESN'T FEEL LIKE THINGS GETTING STUCK GOING DOWN. LAST 2 WEEKS HICCUPPING ALL THE TIME. NO CHANGE IN VISION. HAS VERTIGO: DOESN'T WANT TO GO AWAY.  MEDS TRIED: ZOFRAN, BUT NOT PHENERGAN OR REGLAN. SUBJECTIVE FEVER/CHILLS. SOB: SAME. DOESN'T FEEL BETTER AFTER SHE THROWS UP. BMs: ABOUT EVERY DAY(#4-5). HAS RLQ DISCOMFORT WHEN SHE GETS UP ASSOCIATED WITH A LITTLE KNOT. FIRST NOTICED KNOW 2 MOS AGO.  PT DENIES FEVER, CHILLS, HEMATOCHEZIA, melena, diarrhea, CHEST PAIN, SHORTNESS OF BREATH,CHANGE IN BOWEL IN HABITS, constipation, abdominal pain, problems swallowing,OR heartburn or indigestion.  Past Medical History  Diagnosis Date  . HTN (hypertension)   . Hypothyroidism   . Uterine cancer 2012  . Anemia   . CKD (chronic kidney disease)   . Dysphagia   . Hyperparathyroidism due to renal insufficiency   . GERD (gastroesophageal reflux disease)   . Diverticulitis   . Vertical diplopia   . Vertigo    Past Surgical History  Procedure Laterality Date  . Cholecystectomy    . Cesarean section    . Laparoscopic total hysterectomy    . Colonoscopy  2000    TICS, [facility]  . Colonoscopy  2003 NUR BRBPR D50 V6    [facility] TICS, [facility]  . Abdominal hysterectomy    . Nasal septum surgery    . Esophagogastroduodenoscopy (egd) with esophageal dilation N/A 06/08/2013    Dr. Sarh Kirschenbaum:Stricture at the gastroesophageal junction/MILD NON-erosive gastritis (inflammation) was found in the gastric antrum; multiple biopsies/NO SOURCE FOR ANEMIA IDETIFIED-MOST LIKELY DUE TO ANEMIA OF CHRONIC DISEASE  . Colonoscopy N/A 09/04/2013   Procedure: COLONOSCOPY;  Surgeon: West Bali, MD;  [facility];  Service: Endoscopy;  Laterality: N/A;  10:30AM-moved to 9:25 Soledad Gerlach to notify pt  . Appendectomy    . Bascilic vein transposition Right 09/17/2013    Procedure: BRACHIAL VEIN TRANSPOSITION;  Surgeon: Fransisco Hertz, MD;  Location: [facility] OR;  Service: Vascular;  Laterality: Right;  . Insertion of dialysis catheter Left 09/17/2013    Procedure: INSERTION OF DIALYSIS CATHETER;  Surgeon: Fransisco Hertz, MD;  Location: [facility] OR;  Service: Vascular;  Laterality: Left;  . Bascilic vein transposition Right 10/29/2013    Procedure: RIGHT SECOND STAGE BRACHIAL VEIN TRANSPOSITION;  Surgeon: Fransisco Hertz, MD;  Location: [facility] OR;  Service: Vascular;  Laterality: Right;  . Givens capsule study N/A 12/03/2013    Procedure: GIVENS CAPSULE STUDY;  Surgeon: West Bali, MD;  Location: [facility];  Service: Endoscopy;  Laterality: N/A;  7:30   Allergies  Allergen Reactions  . Cephalexin Itching   Current Outpatient Prescriptions  Medication Sig Dispense Refill  . alendronate (FOSAMAX) 70 MG tablet     . allopurinol (ZYLOPRIM) 300 MG tablet Take 300 mg by mouth daily as needed (for gout).     . cloNIDine (CATAPRES) 0.2 MG tablet     . docusate sodium (COLACE) 100 MG capsule Take 100 mg by mouth 2 (two) times daily.    . ferrous sulfate dried (SLOW FE) 160 (50 FE) MG TBCR SR tablet Take  640 mg by mouth daily. FOR > 1-2 MSO   . hydrALAZINE (APRESOLINE) 100 MG tablet Take 100 mg by mouth 3 (three) times daily.    Marland Kitchen labetalol (NORMODYNE) 300 MG tablet Take 1 tablet (300 mg total) by mouth 3 (three) times daily.    Marland Kitchen levothyroxine (SYNTHROID, LEVOTHROID) 50 MCG tablet Take 1 tablet (50 mcg total) by mouth daily.    Marland Kitchen lidocaine-prilocaine (EMLA) cream Apply 1 application topically as needed (on Dialysis days Tues,Thurs.,Sat.).    Marland Kitchen meclizine (ANTIVERT) 25 MG tablet Take 1 tablet by mouth 4 (four) times daily as needed. dizziness    .  NIFEdipine (PROCARDIA XL/ADALAT-CC) 90 MG 24 hr tablet Take 90 mg by mouth daily.    Marland Kitchen omeprazole (PRILOSEC) 20 MG capsule Take 1 capsule (20 mg total) by mouth daily.    . ondansetron (ZOFRAN) 4 MG tablet     . sevelamer carbonate (RENVELA) 800 MG tablet Patient takes 1 tablet with snack and 3 tablets with meal    .       Review of Systems PER HPI OTHERWISE ALL SYSTEMS ARE NEGATIVE.    Objective:   Physical Exam  Constitutional: She is oriented to person, place, and time. She appears well-developed and well-nourished. No distress.  HENT:  Head: Normocephalic and atraumatic.  Mouth/Throat: Oropharynx is clear and moist. No oropharyngeal exudate.  Eyes: Pupils are equal, round, and reactive to light. No scleral icterus.  Neck: Normal range of motion. Neck supple.  Cardiovascular: Normal rate, regular rhythm and normal heart sounds.   Pulmonary/Chest: Effort normal and breath sounds normal. No respiratory distress.  Abdominal: Soft. Bowel sounds are normal. She exhibits no distension. There is no tenderness.  Musculoskeletal: She exhibits no edema.  WALKS ASSISTED WITH A walker  Lymphadenopathy:    She has no cervical adenopathy.  Neurological: She is alert and oriented to person, place, and time.  NO  NEW FOCAL DEFICITS  Psychiatric: She has a normal mood and affect.  Vitals reviewed.         Assessment & Plan:

## 2015-05-13 NOTE — Assessment & Plan Note (Addendum)
SYMPTOMS NOT CONTROLLED. ASSOCIATED WITH UNINTENTIONAL WEIGHT LOSS: 30 LBS IN ONE YR IN SETTING OF UNCONTROLLED HTN, ESRD, PMHx: CVA.  DIFFERENTIAL DIAGNOSIS INCLUDES: SX DUE TO VERTIGO, GASTROPARESIS, H PYLORI GASTRITIS, OCCULT STRICTURE OR PYLORIC STENOSIS, ANOREXIA DUE TO 2 HOSPITALIZATIONS IN 2016 +/- DEPRESSION, LESS LIKELY OCCULT MALIGNANCY.  Stop oral iron for 3 mos. START REGLAN 30 MINS BEFORE BREAKFAST AND LUNCH AND AT BEDTIME. SIDE EFFECTS OF REGLAN INCLUDE  AGITATION, DRAINAGE FROM BREAST, INVOLUNTARY MOVEMENT OF HEAD, NECK AND TORSO, OR CHANGES IN VISION. PLEASE CALL IF YOU ARE HAVING ANY PROBLEMS. CONTINUE OMEPRAZOLE.  TAKE 30 MINUTES PRIOR TO YOUR FIRST MEAL. UPPER ENDOSCOPY/POSSIBLE DILATION OCT 21. DISCUSSED PROCEDURE, BENEFITS, & RISKS: < 1% chance of medication reaction, OR bleeding. FOLLOW UP IN 4 MOS.

## 2015-05-13 NOTE — Progress Notes (Signed)
cc'ed to pcp °

## 2015-05-13 NOTE — Patient Instructions (Addendum)
Stop IRON PILLS for 3 mos.  TO REDUCE NAUSEA/VOMITING:     1. START REGLAN 30 MINS BEFORE BREAKFAST AND LUNCH AND AT BEDTIME. SIDE EFFECTS OF REGLAN INCLUDE  AGITATION, DRAINAGE FROM BREAST, INVOLUNTARY MOVEMENT OF HEAD, NECK AND TORSO, OR CHANGES IN VISION. PLEASE CALL IF YOU ARE HAVING ANY PROBLEMS.     2. USE ONDANSETRON(ZOFRAN) AS NEEDED     3. CONTINUE OMEPRAZOLE.  TAKE 30 MINUTES PRIOR TO YOUR FIRST MEAL.  UPPER ENDOSCOPY OCT 21.  FOLLOW UP IN 4 MOS.

## 2015-05-14 NOTE — Progress Notes (Signed)
ON RECALL  °

## 2015-05-15 DIAGNOSIS — Z992 Dependence on renal dialysis: Secondary | ICD-10-CM | POA: Diagnosis not present

## 2015-05-15 DIAGNOSIS — N2581 Secondary hyperparathyroidism of renal origin: Secondary | ICD-10-CM | POA: Diagnosis not present

## 2015-05-15 DIAGNOSIS — N186 End stage renal disease: Secondary | ICD-10-CM | POA: Diagnosis not present

## 2015-05-15 DIAGNOSIS — D509 Iron deficiency anemia, unspecified: Secondary | ICD-10-CM | POA: Diagnosis not present

## 2015-05-15 DIAGNOSIS — D631 Anemia in chronic kidney disease: Secondary | ICD-10-CM | POA: Diagnosis not present

## 2015-05-17 DIAGNOSIS — N186 End stage renal disease: Secondary | ICD-10-CM | POA: Diagnosis not present

## 2015-05-17 DIAGNOSIS — N2581 Secondary hyperparathyroidism of renal origin: Secondary | ICD-10-CM | POA: Diagnosis not present

## 2015-05-17 DIAGNOSIS — Z992 Dependence on renal dialysis: Secondary | ICD-10-CM | POA: Diagnosis not present

## 2015-05-17 DIAGNOSIS — D631 Anemia in chronic kidney disease: Secondary | ICD-10-CM | POA: Diagnosis not present

## 2015-05-17 DIAGNOSIS — D509 Iron deficiency anemia, unspecified: Secondary | ICD-10-CM | POA: Diagnosis not present

## 2015-05-20 DIAGNOSIS — N186 End stage renal disease: Secondary | ICD-10-CM | POA: Diagnosis not present

## 2015-05-20 DIAGNOSIS — Z992 Dependence on renal dialysis: Secondary | ICD-10-CM | POA: Diagnosis not present

## 2015-05-20 DIAGNOSIS — N2581 Secondary hyperparathyroidism of renal origin: Secondary | ICD-10-CM | POA: Diagnosis not present

## 2015-05-20 DIAGNOSIS — D509 Iron deficiency anemia, unspecified: Secondary | ICD-10-CM | POA: Diagnosis not present

## 2015-05-20 DIAGNOSIS — D631 Anemia in chronic kidney disease: Secondary | ICD-10-CM | POA: Diagnosis not present

## 2015-05-22 DIAGNOSIS — N186 End stage renal disease: Secondary | ICD-10-CM | POA: Diagnosis not present

## 2015-05-22 DIAGNOSIS — D509 Iron deficiency anemia, unspecified: Secondary | ICD-10-CM | POA: Diagnosis not present

## 2015-05-22 DIAGNOSIS — Z992 Dependence on renal dialysis: Secondary | ICD-10-CM | POA: Diagnosis not present

## 2015-05-22 DIAGNOSIS — D631 Anemia in chronic kidney disease: Secondary | ICD-10-CM | POA: Diagnosis not present

## 2015-05-22 DIAGNOSIS — N2581 Secondary hyperparathyroidism of renal origin: Secondary | ICD-10-CM | POA: Diagnosis not present

## 2015-05-23 ENCOUNTER — Encounter (HOSPITAL_COMMUNITY)
Admission: RE | Disposition: A | Discharge status: Home / Self Care | Payer: Self-pay | Source: Ambulatory Visit | Attending: Gastroenterology

## 2015-05-23 ENCOUNTER — Encounter (HOSPITAL_COMMUNITY): Payer: Self-pay | Admitting: *Deleted

## 2015-05-23 ENCOUNTER — Ambulatory Visit (HOSPITAL_COMMUNITY)
Admission: RE | Admit: 2015-05-23 | Discharge: 2015-05-23 | Disposition: A | Discharge status: Home / Self Care | Payer: Medicare Other | Source: Ambulatory Visit | Attending: Gastroenterology | Admitting: Gastroenterology

## 2015-05-23 DIAGNOSIS — R1310 Dysphagia, unspecified: Secondary | ICD-10-CM

## 2015-05-23 DIAGNOSIS — E039 Hypothyroidism, unspecified: Secondary | ICD-10-CM | POA: Insufficient documentation

## 2015-05-23 DIAGNOSIS — K219 Gastro-esophageal reflux disease without esophagitis: Secondary | ICD-10-CM | POA: Insufficient documentation

## 2015-05-23 DIAGNOSIS — I129 Hypertensive chronic kidney disease with stage 1 through stage 4 chronic kidney disease, or unspecified chronic kidney disease: Secondary | ICD-10-CM | POA: Diagnosis not present

## 2015-05-23 DIAGNOSIS — R634 Abnormal weight loss: Secondary | ICD-10-CM | POA: Diagnosis not present

## 2015-05-23 DIAGNOSIS — Z888 Allergy status to other drugs, medicaments and biological substances status: Secondary | ICD-10-CM | POA: Insufficient documentation

## 2015-05-23 DIAGNOSIS — R112 Nausea with vomiting, unspecified: Secondary | ICD-10-CM

## 2015-05-23 DIAGNOSIS — N2581 Secondary hyperparathyroidism of renal origin: Secondary | ICD-10-CM | POA: Insufficient documentation

## 2015-05-23 DIAGNOSIS — Z8542 Personal history of malignant neoplasm of other parts of uterus: Secondary | ICD-10-CM | POA: Insufficient documentation

## 2015-05-23 DIAGNOSIS — K3189 Other diseases of stomach and duodenum: Secondary | ICD-10-CM | POA: Insufficient documentation

## 2015-05-23 DIAGNOSIS — N189 Chronic kidney disease, unspecified: Secondary | ICD-10-CM | POA: Diagnosis not present

## 2015-05-23 DIAGNOSIS — K222 Esophageal obstruction: Secondary | ICD-10-CM | POA: Diagnosis not present

## 2015-05-23 DIAGNOSIS — R1110 Vomiting, unspecified: Secondary | ICD-10-CM | POA: Insufficient documentation

## 2015-05-23 HISTORY — PX: ESOPHAGOGASTRODUODENOSCOPY: SHX5428

## 2015-05-23 HISTORY — PX: SAVORY DILATION: SHX5439

## 2015-05-23 SURGERY — EGD (ESOPHAGOGASTRODUODENOSCOPY)
Anesthesia: Moderate Sedation

## 2015-05-23 MED ORDER — MIDAZOLAM HCL 5 MG/5ML IJ SOLN
INTRAMUSCULAR | Status: DC | PRN
  Administered 2015-05-23: 1 mg via INTRAVENOUS
  Administered 2015-05-23 (×2): 2 mg via INTRAVENOUS

## 2015-05-23 MED ORDER — SODIUM CHLORIDE 0.9 % IV SOLN
INTRAVENOUS | Status: DC
  Administered 2015-05-23: 12:00:00 via INTRAVENOUS

## 2015-05-23 MED ORDER — STERILE WATER FOR IRRIGATION IR SOLN
Status: DC | PRN
  Administered 2015-05-23: 13:00:00

## 2015-05-23 MED ORDER — LIDOCAINE VISCOUS 2 % MT SOLN
OROMUCOSAL | Status: DC | PRN
  Administered 2015-05-23: 2 mL via OROMUCOSAL

## 2015-05-23 MED ORDER — MEPERIDINE HCL 100 MG/ML IJ SOLN
INTRAMUSCULAR | Status: DC | PRN
  Administered 2015-05-23 (×3): 25 mg via INTRAVENOUS

## 2015-05-23 MED ORDER — MIDAZOLAM HCL 5 MG/5ML IJ SOLN
INTRAMUSCULAR | Status: AC
  Filled 2015-05-23: qty 10

## 2015-05-23 MED ORDER — MINERAL OIL PO OIL
TOPICAL_OIL | ORAL | Status: AC
  Filled 2015-05-23: qty 30

## 2015-05-23 MED ORDER — MEPERIDINE HCL 100 MG/ML IJ SOLN
INTRAMUSCULAR | Status: AC
  Filled 2015-05-23: qty 2

## 2015-05-23 MED ORDER — LIDOCAINE VISCOUS 2 % MT SOLN
OROMUCOSAL | Status: AC
  Filled 2015-05-23: qty 15

## 2015-05-23 NOTE — Interval H&P Note (Signed)
History and Physical Interval Note:  05/23/2015 1:01 PM  Sonya [name]  has presented today for surgery, with the diagnosis of WEIGHT LOSS/N/V  The various methods of treatment have been discussed with the patient and family. After consideration of risks, benefits and other options for treatment, the patient has consented to  Procedure(s) with comments: ESOPHAGOGASTRODUODENOSCOPY (EGD) (N/A) - 1230 SAVORY DILATION (N/A) as a surgical intervention .  The patient's history has been reviewed, patient examined, no change in status, stable for surgery.  I have reviewed the patient's chart and labs.  Questions were answered to the patient's satisfaction.     [employer]

## 2015-05-23 NOTE — Discharge Instructions (Signed)
I dilated your esophagus. You have a stricture near the base of your esophagus DUE TO ACID REFLUX.  You have mild gastritis. I biopsied your stomach.   NO HEPARIN WITH DIALYSIS UNTIL OCT 29.  AVOID FOOD THAT TRIGGERS REFLUX. SEE INFO BELOW.   YOUR BIOPSY RESULTS WILL BE AVAILABLE IN MY CHART AFTER OCT 23 AND MY OFFICE WILL CONTACT YOU IN 10-14 DAYS WITH YOUR RESULTS.   FOLLOW UP IN 3 MOS.  UPPER ENDOSCOPY AFTER CARE Read the instructions outlined below and refer to this sheet in the next week. These discharge instructions provide you with general information on caring for yourself after you leave the hospital. While your treatment has been planned according to the most current medical practices available, unavoidable complications occasionally occur. If you have any problems or questions after discharge, call DR. Mailey Landstrom, [phone].  ACTIVITY  You may resume your regular activity, but move at a slower pace for the next 24 hours.   Take frequent rest periods for the next 24 hours.   Walking will help get rid of the air and reduce the bloated feeling in your belly (abdomen).   No driving for 24 hours (because of the medicine (anesthesia) used during the test).   You may shower.   Do not sign any important legal documents or operate any machinery for 24 hours (because of the anesthesia used during the test).    NUTRITION  Drink plenty of fluids.   You may resume your normal diet as instructed by your doctor.   Begin with a light meal and progress to your normal diet. Heavy or fried foods are harder to digest and may make you feel sick to your stomach (nauseated).   Avoid alcoholic beverages for 24 hours or as instructed.    MEDICATIONS  You may resume your normal medications.   WHAT YOU CAN EXPECT TODAY  Some feelings of bloating in the abdomen.   Passage of more gas than usual.    IF YOU HAD A BIOPSY TAKEN DURING THE UPPER ENDOSCOPY:  Eat a soft diet IF YOU HAVE  NAUSEA, BLOATING, ABDOMINAL PAIN, OR VOMITING.    FINDING OUT THE RESULTS OF YOUR TEST Not all test results are available during your visit. DR. Oneida Alar WILL CALL YOU WITHIN 14 DAYS OF YOUR PROCEDUE WITH YOUR RESULTS. Do not assume everything is normal if you have not heard from DR. Gittel Mccamish, CALL HER OFFICE AT [phone].  SEEK IMMEDIATE MEDICAL ATTENTION AND CALL THE OFFICE: [phone] IF:  You have more than a spotting of blood in your stool.   Your belly is swollen (abdominal distention).   You are nauseated or vomiting.   You have a temperature over 101F.   You have abdominal pain or discomfort that is severe or gets worse throughout the day.  Gastritis  Gastritis is an inflammation (the body's way of reacting to injury and/or infection) of the stomach. It is often caused by viral or bacterial (germ) infections. It can also be caused BY ASPIRIN, BC/GOODY POWDER'S, (IBUPROFEN) MOTRIN, OR ALEVE (NAPROXEN), chemicals (including alcohol), SPICY FOODS, and medications. This illness may be associated with generalized malaise (feeling tired, not well), UPPER ABDOMINAL STOMACH cramps, and fever. One common bacterial cause of gastritis is an organism known as H. Pylori. This can be treated with antibiotics.   ESOPHAGEAL STRICTURE  Esophageal strictures can be caused by stomach acid backing up into the tube that carries food from the mouth down to the stomach (lower esophagus).  TREATMENT  There are a number of medicines used to treat reflux/stricture, including: Antacids.  Proton-pump inhibitors: OMEPRAZOLE  HOME CARE INSTRUCTIONS Eat 2-3 hours before going to bed.  Try to reach and maintain a healthy weight.  Do not eat just a few very large meals. Instead, eat 4 TO 6 smaller meals throughout the day.  Try to identify foods and beverages that make your symptoms worse, and avoid these.  Avoid tight clothing.  Do not exercise right after eating.  Continue DEXILANT. USE BENTYL 1 TO 2  TIMES A DAY. FOLLOW UP EVERY 1 TO 2 YEARS.   Lifestyle and home remedies You may eliminate or reduce the frequency of heartburn by making the following lifestyle changes:   Control your weight. Being overweight is a major risk factor for heartburn and GERD. Excess pounds put pressure on your abdomen, pushing up your stomach and causing acid to back up into your esophagus.    Eat smaller meals. 4 TO 6 MEALS A DAY. This reduces pressure on the lower esophageal sphincter, helping to prevent the valve from opening and acid from washing back into your esophagus.    Loosen your belt. Clothes that fit tightly around your waist put pressure on your abdomen and the lower esophageal sphincter.    Eliminate heartburn triggers. Everyone has specific triggers. Common triggers such as fatty or fried foods, spicy food, tomato sauce, carbonated beverages, alcohol, chocolate, mint, garlic, onion, caffeine and nicotine may make heartburn worse.    Avoid stooping or bending. Tying your shoes is OK. Bending over for longer periods to weed your garden isn't, especially soon after eating.    Don't lie down after a meal. Wait at least three to four hours after eating before going to bed, and don't lie down right after eating.   Alternative medicine  Several home remedies exist for treating GERD, but they provide only temporary relief. They include drinking baking soda (sodium bicarbonate) added to water or drinking other fluids such as baking soda mixed with cream of tartar and water.  Although these liquids create temporary relief by neutralizing, washing away or buffering acids, eventually they aggravate the situation by adding gas and fluid to your stomach, increasing pressure and causing more acid reflux. Further, adding more sodium to your diet may increase your blood pressure and add stress to your heart, and excessive bicarbonate ingestion can alter the acid-base balance in your body.

## 2015-05-23 NOTE — Op Note (Signed)
[facility] [address] [place], [address]   ENDOSCOPY PROCEDURE REPORT  PATIENT: Sonya, [name]  MR#: [id number] BIRTHDATE: 26-Sep-1950 , 61  yrs. old GENDER: female  ENDOSCOPIST: Danie Binder, MD REFFERED [id number] Gerarda Fraction, M.D.  PROCEDURE DATE:  2015-06-11 PROCEDURE:   EGD with biopsy and EGD with dilatation over guidewire   INDICATIONS:1.  dysphagia.   2.  dyspepsia. MEDICATIONS: Demerol 75 mg IV and Versed 5 mg IV TOPICAL ANESTHETIC: Viscous Xylocaine  DESCRIPTION OF PROCEDURE:   After the risks benefits and alternatives of the procedure were thoroughly explained, informed consent was obtained.  The [id number]  endoscope was introduced through the mouth and advanced to the second portion of the duodenum. The instrument was slowly withdrawn as the mucosa was carefully examined.  Prior to withdrawal of the scope, the guidwire was placed.  The esophagus was dilated successfully.  The patient was recovered in endoscopy and discharged home in satisfactory condition. Estimated blood loss is zero unless otherwise noted in this procedure report.   ESOPHAGUS: A stricture was found at the gastroesophageal junction. STOMACH: Mild non-erosive gastritis (inflammation) was found in the gastric antrum.  Multiple biopsies were performed using cold forceps.   DUODENUM: The duodenal mucosa showed no abnormalities in the bulb and second portion of the duodenum.   Dilation was then performed at the gastroesphageal junction  Dilator: Savary over guidewire Size(s): 14-17 MM Resistance: moderate Heme: yes TRACE  COMPLICATIONS: There were no immediate complications.  ENDOSCOPIC IMPRESSION: 1.   Stricture at the gastroesophageal junction 2.   MILD Non-erosive gastritis  RECOMMENDATIONS: NO HEPARIN WITH DIALYSIS UNTIL OCT 29. AVOID FOOD THAT TRIGGERS REFLUX. AWAIT BIOPSY RESULTS. FOLLOW UP IN 3 MOS.     _______________________________ eSignedDanie Binder, MD 06-11-15 1:53 PM   CPT CODES: ICD CODES:  The ICD and CPT codes recommended by this software are interpretations from the data that the clinical staff has captured with the software.  The verification of the translation of this report to the ICD and CPT codes and modifiers is the sole responsibility of the health care institution and practicing physician where this report was generated.  [place]. will not be held responsible for the validity of the ICD and CPT codes included on this report.  AMA assumes no liability for data contained or not contained herein. CPT is a Designer, television/film set of the [employer].

## 2015-05-23 NOTE — H&P (View-Only) (Signed)
Subjective:    Patient ID: Sonya [name], female    DOB: Jan 27, 1951, 64 y.o.   MRN: [id number]  Sonya [name]., MD  HPI PROBLEMS WITH NAUSEA/VOMITING FOR PAST MO. HAS VOMITING MORE DAYS THAN NOT. WEIGHT LOSS: LOST 20 LBS SINCE SEP 2015. ON HD MWF. WHEN SHE DOES THROW UP , TAKES MEDICINE STARTS GETTING SICK. HAPPENS MOST MORNINGS, BUT CAN BE IN EVENINGS OR NIGHT. NO BLOOD IN VOMIT. DOESN'T FEEL LIKE THINGS GETTING STUCK GOING DOWN. LAST 2 WEEKS HICCUPPING ALL THE TIME. NO CHANGE IN VISION. HAS VERTIGO: DOESN'T WANT TO GO AWAY.  MEDS TRIED: ZOFRAN, BUT NOT PHENERGAN OR REGLAN. SUBJECTIVE FEVER/CHILLS. SOB: SAME. DOESN'T FEEL BETTER AFTER SHE THROWS UP. BMs: ABOUT EVERY DAY(#4-5). HAS RLQ DISCOMFORT WHEN SHE GETS UP ASSOCIATED WITH A LITTLE KNOT. FIRST NOTICED KNOW 2 MOS AGO.  PT DENIES FEVER, CHILLS, HEMATOCHEZIA, melena, diarrhea, CHEST PAIN, SHORTNESS OF BREATH,CHANGE IN BOWEL IN HABITS, constipation, abdominal pain, problems swallowing,OR heartburn or indigestion.  Past Medical History  Diagnosis Date  . HTN (hypertension)   . Hypothyroidism   . Uterine cancer 2012  . Anemia   . CKD (chronic kidney disease)   . Dysphagia   . Hyperparathyroidism due to renal insufficiency   . GERD (gastroesophageal reflux disease)   . Diverticulitis   . Vertical diplopia   . Vertigo    Past Surgical History  Procedure Laterality Date  . Cholecystectomy    . Cesarean section    . Laparoscopic total hysterectomy    . Colonoscopy  2000    TICS, [facility]  . Colonoscopy  2003 NUR BRBPR D50 V6    [facility] TICS, [facility]  . Abdominal hysterectomy    . Nasal septum surgery    . Esophagogastroduodenoscopy (egd) with esophageal dilation N/A 06/08/2013    Dr. Sarh Kirschenbaum:Stricture at the gastroesophageal junction/MILD NON-erosive gastritis (inflammation) was found in the gastric antrum; multiple biopsies/NO SOURCE FOR ANEMIA IDETIFIED-MOST LIKELY DUE TO ANEMIA OF CHRONIC DISEASE  . Colonoscopy N/A 09/04/2013   Procedure: COLONOSCOPY;  Surgeon: West Bali, MD;  [facility];  Service: Endoscopy;  Laterality: N/A;  10:30AM-moved to 9:25 Soledad Gerlach to notify pt  . Appendectomy    . Bascilic vein transposition Right 09/17/2013    Procedure: BRACHIAL VEIN TRANSPOSITION;  Surgeon: Fransisco Hertz, MD;  Location: [facility] OR;  Service: Vascular;  Laterality: Right;  . Insertion of dialysis catheter Left 09/17/2013    Procedure: INSERTION OF DIALYSIS CATHETER;  Surgeon: Fransisco Hertz, MD;  Location: [facility] OR;  Service: Vascular;  Laterality: Left;  . Bascilic vein transposition Right 10/29/2013    Procedure: RIGHT SECOND STAGE BRACHIAL VEIN TRANSPOSITION;  Surgeon: Fransisco Hertz, MD;  Location: [facility] OR;  Service: Vascular;  Laterality: Right;  . Givens capsule study N/A 12/03/2013    Procedure: GIVENS CAPSULE STUDY;  Surgeon: West Bali, MD;  [facility]: [facility];  Service: Endoscopy;  Laterality: N/A;  7:30   Allergies  Allergen Reactions  . Cephalexin Itching   Current Outpatient Prescriptions  Medication Sig Dispense Refill  . alendronate (FOSAMAX) 70 MG tablet     . allopurinol (ZYLOPRIM) 300 MG tablet Take 300 mg by mouth daily as needed (for gout).     . cloNIDine (CATAPRES) 0.2 MG tablet     . docusate sodium (COLACE) 100 MG capsule Take 100 mg by mouth 2 (two) times daily.    . ferrous sulfate dried (SLOW FE) 160 (50 FE) MG TBCR SR tablet Take  640 mg by mouth daily. FOR > 1-2 MSO   . hydrALAZINE (APRESOLINE) 100 MG tablet Take 100 mg by mouth 3 (three) times daily.    Marland Kitchen labetalol (NORMODYNE) 300 MG tablet Take 1 tablet (300 mg total) by mouth 3 (three) times daily.    Marland Kitchen levothyroxine (SYNTHROID, LEVOTHROID) 50 MCG tablet Take 1 tablet (50 mcg total) by mouth daily.    Marland Kitchen lidocaine-prilocaine (EMLA) cream Apply 1 application topically as needed (on Dialysis days Tues,Thurs.,Sat.).    Marland Kitchen meclizine (ANTIVERT) 25 MG tablet Take 1 tablet by mouth 4 (four) times daily as needed. dizziness    .  NIFEdipine (PROCARDIA XL/ADALAT-CC) 90 MG 24 hr tablet Take 90 mg by mouth daily.    Marland Kitchen omeprazole (PRILOSEC) 20 MG capsule Take 1 capsule (20 mg total) by mouth daily.    . ondansetron (ZOFRAN) 4 MG tablet     . sevelamer carbonate (RENVELA) 800 MG tablet Patient takes 1 tablet with snack and 3 tablets with meal    .       Review of Systems PER HPI OTHERWISE ALL SYSTEMS ARE NEGATIVE.    Objective:   Physical Exam  Constitutional: She is oriented to person, place, and time. She appears well-developed and well-nourished. No distress.  HENT:  Head: Normocephalic and atraumatic.  Mouth/Throat: Oropharynx is clear and moist. No oropharyngeal exudate.  Eyes: Pupils are equal, round, and reactive to light. No scleral icterus.  Neck: Normal range of motion. Neck supple.  Cardiovascular: Normal rate, regular rhythm and normal heart sounds.   Pulmonary/Chest: Effort normal and breath sounds normal. No respiratory distress.  Abdominal: Soft. Bowel sounds are normal. She exhibits no distension. There is no tenderness.  Musculoskeletal: She exhibits no edema.  WALKS ASSISTED WITH A walker  Lymphadenopathy:    She has no cervical adenopathy.  Neurological: She is alert and oriented to person, place, and time.  NO  NEW FOCAL DEFICITS  Psychiatric: She has a normal mood and affect.  Vitals reviewed.         Assessment & Plan:

## 2015-05-24 DIAGNOSIS — D509 Iron deficiency anemia, unspecified: Secondary | ICD-10-CM | POA: Diagnosis not present

## 2015-05-24 DIAGNOSIS — N186 End stage renal disease: Secondary | ICD-10-CM | POA: Diagnosis not present

## 2015-05-24 DIAGNOSIS — D631 Anemia in chronic kidney disease: Secondary | ICD-10-CM | POA: Diagnosis not present

## 2015-05-24 DIAGNOSIS — N2581 Secondary hyperparathyroidism of renal origin: Secondary | ICD-10-CM | POA: Diagnosis not present

## 2015-05-24 DIAGNOSIS — Z992 Dependence on renal dialysis: Secondary | ICD-10-CM | POA: Diagnosis not present

## 2015-05-27 DIAGNOSIS — N186 End stage renal disease: Secondary | ICD-10-CM | POA: Diagnosis not present

## 2015-05-27 DIAGNOSIS — D631 Anemia in chronic kidney disease: Secondary | ICD-10-CM | POA: Diagnosis not present

## 2015-05-27 DIAGNOSIS — N2581 Secondary hyperparathyroidism of renal origin: Secondary | ICD-10-CM | POA: Diagnosis not present

## 2015-05-27 DIAGNOSIS — Z992 Dependence on renal dialysis: Secondary | ICD-10-CM | POA: Diagnosis not present

## 2015-05-27 DIAGNOSIS — D509 Iron deficiency anemia, unspecified: Secondary | ICD-10-CM | POA: Diagnosis not present

## 2015-05-28 ENCOUNTER — Encounter (HOSPITAL_COMMUNITY): Payer: Self-pay | Admitting: Gastroenterology

## 2015-05-29 DIAGNOSIS — D631 Anemia in chronic kidney disease: Secondary | ICD-10-CM | POA: Diagnosis not present

## 2015-05-29 DIAGNOSIS — N186 End stage renal disease: Secondary | ICD-10-CM | POA: Diagnosis not present

## 2015-05-29 DIAGNOSIS — Z992 Dependence on renal dialysis: Secondary | ICD-10-CM | POA: Diagnosis not present

## 2015-05-29 DIAGNOSIS — D509 Iron deficiency anemia, unspecified: Secondary | ICD-10-CM | POA: Diagnosis not present

## 2015-05-29 DIAGNOSIS — N2581 Secondary hyperparathyroidism of renal origin: Secondary | ICD-10-CM | POA: Diagnosis not present

## 2015-05-30 ENCOUNTER — Encounter (HOSPITAL_COMMUNITY): Payer: Self-pay | Admitting: *Deleted

## 2015-05-30 ENCOUNTER — Emergency Department (HOSPITAL_COMMUNITY): Payer: Medicare Other

## 2015-05-30 ENCOUNTER — Inpatient Hospital Stay (HOSPITAL_COMMUNITY)
Admission: EM | Admit: 2015-05-30 | Discharge: 2015-06-04 | DRG: 480 | Disposition: A | Discharge status: Skilled Nursing Facility | Payer: Medicare Other | Attending: Internal Medicine | Admitting: Internal Medicine

## 2015-05-30 DIAGNOSIS — S72002A Fracture of unspecified part of neck of left femur, initial encounter for closed fracture: Secondary | ICD-10-CM | POA: Diagnosis not present

## 2015-05-30 DIAGNOSIS — K3184 Gastroparesis: Secondary | ICD-10-CM | POA: Diagnosis present

## 2015-05-30 DIAGNOSIS — I10 Essential (primary) hypertension: Secondary | ICD-10-CM

## 2015-05-30 DIAGNOSIS — Z8542 Personal history of malignant neoplasm of other parts of uterus: Secondary | ICD-10-CM

## 2015-05-30 DIAGNOSIS — Z881 Allergy status to other antibiotic agents status: Secondary | ICD-10-CM | POA: Diagnosis not present

## 2015-05-30 DIAGNOSIS — Z419 Encounter for procedure for purposes other than remedying health state, unspecified: Secondary | ICD-10-CM | POA: Insufficient documentation

## 2015-05-30 DIAGNOSIS — S72012A Unspecified intracapsular fracture of left femur, initial encounter for closed fracture: Secondary | ICD-10-CM | POA: Diagnosis not present

## 2015-05-30 DIAGNOSIS — K2970 Gastritis, unspecified, without bleeding: Secondary | ICD-10-CM | POA: Diagnosis not present

## 2015-05-30 DIAGNOSIS — Z809 Family history of malignant neoplasm, unspecified: Secondary | ICD-10-CM | POA: Diagnosis not present

## 2015-05-30 DIAGNOSIS — Z992 Dependence on renal dialysis: Secondary | ICD-10-CM | POA: Diagnosis not present

## 2015-05-30 DIAGNOSIS — N390 Urinary tract infection, site not specified: Secondary | ICD-10-CM | POA: Diagnosis not present

## 2015-05-30 DIAGNOSIS — R1310 Dysphagia, unspecified: Secondary | ICD-10-CM | POA: Diagnosis present

## 2015-05-30 DIAGNOSIS — N186 End stage renal disease: Secondary | ICD-10-CM | POA: Diagnosis present

## 2015-05-30 DIAGNOSIS — G9340 Encephalopathy, unspecified: Secondary | ICD-10-CM | POA: Diagnosis not present

## 2015-05-30 DIAGNOSIS — K5792 Diverticulitis of intestine, part unspecified, without perforation or abscess without bleeding: Secondary | ICD-10-CM | POA: Diagnosis not present

## 2015-05-30 DIAGNOSIS — S0990XA Unspecified injury of head, initial encounter: Secondary | ICD-10-CM | POA: Diagnosis not present

## 2015-05-30 DIAGNOSIS — I120 Hypertensive chronic kidney disease with stage 5 chronic kidney disease or end stage renal disease: Secondary | ICD-10-CM | POA: Diagnosis present

## 2015-05-30 DIAGNOSIS — D631 Anemia in chronic kidney disease: Secondary | ICD-10-CM | POA: Diagnosis present

## 2015-05-30 DIAGNOSIS — D62 Acute posthemorrhagic anemia: Secondary | ICD-10-CM | POA: Diagnosis not present

## 2015-05-30 DIAGNOSIS — M109 Gout, unspecified: Secondary | ICD-10-CM | POA: Diagnosis present

## 2015-05-30 DIAGNOSIS — K5900 Constipation, unspecified: Secondary | ICD-10-CM | POA: Diagnosis not present

## 2015-05-30 DIAGNOSIS — I160 Hypertensive urgency: Secondary | ICD-10-CM | POA: Diagnosis not present

## 2015-05-30 DIAGNOSIS — Z8249 Family history of ischemic heart disease and other diseases of the circulatory system: Secondary | ICD-10-CM | POA: Diagnosis not present

## 2015-05-30 DIAGNOSIS — W010XXA Fall on same level from slipping, tripping and stumbling without subsequent striking against object, initial encounter: Secondary | ICD-10-CM | POA: Diagnosis present

## 2015-05-30 DIAGNOSIS — Z66 Do not resuscitate: Secondary | ICD-10-CM | POA: Diagnosis present

## 2015-05-30 DIAGNOSIS — M6281 Muscle weakness (generalized): Secondary | ICD-10-CM | POA: Diagnosis not present

## 2015-05-30 DIAGNOSIS — S72002D Fracture of unspecified part of neck of left femur, subsequent encounter for closed fracture with routine healing: Secondary | ICD-10-CM | POA: Diagnosis not present

## 2015-05-30 DIAGNOSIS — E43 Unspecified severe protein-calorie malnutrition: Secondary | ICD-10-CM | POA: Diagnosis not present

## 2015-05-30 DIAGNOSIS — I619 Nontraumatic intracerebral hemorrhage, unspecified: Secondary | ICD-10-CM | POA: Diagnosis not present

## 2015-05-30 DIAGNOSIS — N039 Chronic nephritic syndrome with unspecified morphologic changes: Secondary | ICD-10-CM

## 2015-05-30 DIAGNOSIS — R519 Headache, unspecified: Secondary | ICD-10-CM

## 2015-05-30 DIAGNOSIS — K219 Gastro-esophageal reflux disease without esophagitis: Secondary | ICD-10-CM | POA: Diagnosis present

## 2015-05-30 DIAGNOSIS — E039 Hypothyroidism, unspecified: Secondary | ICD-10-CM | POA: Diagnosis present

## 2015-05-30 DIAGNOSIS — H532 Diplopia: Secondary | ICD-10-CM | POA: Diagnosis not present

## 2015-05-30 DIAGNOSIS — S299XXA Unspecified injury of thorax, initial encounter: Secondary | ICD-10-CM | POA: Diagnosis not present

## 2015-05-30 DIAGNOSIS — N2581 Secondary hyperparathyroidism of renal origin: Secondary | ICD-10-CM | POA: Diagnosis not present

## 2015-05-30 DIAGNOSIS — R262 Difficulty in walking, not elsewhere classified: Secondary | ICD-10-CM | POA: Diagnosis not present

## 2015-05-30 DIAGNOSIS — R1110 Vomiting, unspecified: Secondary | ICD-10-CM | POA: Diagnosis not present

## 2015-05-30 DIAGNOSIS — R112 Nausea with vomiting, unspecified: Secondary | ICD-10-CM | POA: Diagnosis not present

## 2015-05-30 DIAGNOSIS — R42 Dizziness and giddiness: Secondary | ICD-10-CM | POA: Diagnosis present

## 2015-05-30 DIAGNOSIS — S72009A Fracture of unspecified part of neck of unspecified femur, initial encounter for closed fracture: Secondary | ICD-10-CM | POA: Insufficient documentation

## 2015-05-30 DIAGNOSIS — Z79899 Other long term (current) drug therapy: Secondary | ICD-10-CM

## 2015-05-30 DIAGNOSIS — N189 Chronic kidney disease, unspecified: Secondary | ICD-10-CM | POA: Diagnosis not present

## 2015-05-30 DIAGNOSIS — R51 Headache: Secondary | ICD-10-CM

## 2015-05-30 DIAGNOSIS — I951 Orthostatic hypotension: Secondary | ICD-10-CM | POA: Diagnosis not present

## 2015-05-30 HISTORY — DX: Fracture of unspecified part of neck of left femur, initial encounter for closed fracture: S72.002A

## 2015-05-30 LAB — CBC WITH DIFFERENTIAL/PLATELET
BASOS ABS: 0 10*3/uL (ref 0.0–0.1)
Basophils Relative: 0 %
Eosinophils Absolute: 0.1 10*3/uL (ref 0.0–0.7)
Eosinophils Relative: 1 %
HEMATOCRIT: 34.6 % — AB (ref 36.0–46.0)
Hemoglobin: 10.5 g/dL — ABNORMAL LOW (ref 12.0–15.0)
LYMPHS PCT: 16 %
Lymphs Abs: 1.1 10*3/uL (ref 0.7–4.0)
MCH: 23.3 pg — ABNORMAL LOW (ref 26.0–34.0)
MCHC: 30.3 g/dL (ref 30.0–36.0)
MCV: 76.9 fL — AB (ref 78.0–100.0)
MONO ABS: 0.6 10*3/uL (ref 0.1–1.0)
MONOS PCT: 9 %
NEUTROS ABS: 5 10*3/uL (ref 1.7–7.7)
Neutrophils Relative %: 73 %
Platelets: 150 10*3/uL (ref 150–400)
RBC: 4.5 MIL/uL (ref 3.87–5.11)
RDW: 22.2 % — ABNORMAL HIGH (ref 11.5–15.5)
WBC: 6.8 10*3/uL (ref 4.0–10.5)

## 2015-05-30 LAB — BASIC METABOLIC PANEL
ANION GAP: 10 (ref 5–15)
BUN: 32 mg/dL — AB (ref 6–20)
CALCIUM: 9.2 mg/dL (ref 8.9–10.3)
CO2: 23 mmol/L (ref 22–32)
Chloride: 102 mmol/L (ref 101–111)
Creatinine, Ser: 6.73 mg/dL — ABNORMAL HIGH (ref 0.44–1.00)
GFR calc Af Amer: 7 mL/min — ABNORMAL LOW (ref >60)
GFR, EST NON AFRICAN AMERICAN: 6 mL/min — AB (ref >60)
GLUCOSE: 96 mg/dL (ref 65–99)
Potassium: 4.7 mmol/L (ref 3.5–5.1)
Sodium: 135 mmol/L (ref 135–145)

## 2015-05-30 LAB — PROTIME-INR
INR: 1.23 (ref 0.00–1.49)
Prothrombin Time: 15.6 seconds — ABNORMAL HIGH (ref 11.6–15.2)

## 2015-05-30 MED ORDER — CLONIDINE HCL 0.2 MG PO TABS
0.2000 mg | ORAL_TABLET | Freq: Once | ORAL | Status: AC
  Administered 2015-05-30: 0.2 mg via ORAL
  Filled 2015-05-30: qty 1

## 2015-05-30 MED ORDER — HYDROCODONE-ACETAMINOPHEN 5-325 MG PO TABS
0.5000 | ORAL_TABLET | Freq: Once | ORAL | Status: AC
  Administered 2015-05-30: 0.5 via ORAL
  Filled 2015-05-30: qty 1

## 2015-05-30 MED ORDER — HYDRALAZINE HCL 20 MG/ML IJ SOLN
20.0000 mg | Freq: Once | INTRAMUSCULAR | Status: AC
  Administered 2015-05-30: 20 mg via INTRAVENOUS
  Filled 2015-05-30: qty 1

## 2015-05-30 NOTE — H&P (Signed)
PCP:   [name], MD   Chief Complaint:  Fall, left hip pain  HPI: 64 yo female ESRD T/R/Sat, htn comes in today after falling over some boxes PTA and injuring her left hip.  No LOC.  Normal state of health prior to fall.  Took her meds this am and her 5pm dosing.  Pain is moderately controlled.  Last dialysis was Thursday and due in am.  Found to have left femoral neck fracture.  Referred for admission for potential surgical repair.  Review of Systems:  Positive and negative as per HPI otherwise all other systems are negative  Past Medical History: Past Medical History  Diagnosis Date  . HTN (hypertension)   . Hypothyroidism   . Uterine cancer ([place]) 2012  . Anemia   . CKD (chronic kidney disease)   . Dysphagia   . Hyperparathyroidism due to renal insufficiency ([place])   . GERD (gastroesophageal reflux disease)   . Diverticulitis   . Vertical diplopia   . Vertigo    Past Surgical History  Procedure Laterality Date  . Cholecystectomy    . Cesarean section    . Laparoscopic total hysterectomy    . Colonoscopy  2000    TICS, IH  . Colonoscopy  2003 NUR BRBPR D50 V6    DC/[place] TICS, IH  . Abdominal hysterectomy    . Nasal septum surgery    . Esophagogastroduodenoscopy (egd) with esophageal dilation N/A 06/08/2013    Dr. Fields:Stricture at the gastroesophageal junction/MILD NON-erosive gastritis (inflammation) was found in the gastric antrum; multiple biopsies/NO SOURCE FOR ANEMIA IDETIFIED-MOST LIKELY DUE TO ANEMIA OF CHRONIC DISEASE  . Colonoscopy N/A 09/04/2013    Procedure: COLONOSCOPY;  Surgeon: Danie Binder, MD;  [facility];  Service: Endoscopy;  Laterality: N/A;  10:30AM-moved to 9:25 [name]  . Appendectomy    . Bascilic vein transposition Right 09/17/2013    Procedure: BRACHIAL VEIN TRANSPOSITION;  Surgeon: Conrad [place], MD;  Location: [place];  Service: Vascular;  Laterality: Right;  . Insertion of dialysis catheter Left 09/17/2013     Procedure: INSERTION OF DIALYSIS CATHETER;  Surgeon: Conrad Okab[place], MD;  Location: [place];  Service: Vascular;  Laterality: Left;  . Bascilic vein transposition Right 10/29/2013    Procedure: RIGHT SECOND STAGE BRACHIAL VEIN TRANSPOSITION;  Surgeon: Conrad , MD;  Location: [facility] OR;  Service: Vascular;  Laterality: Right;  . Givens capsule study N/A 12/03/2013    Procedure: GIVENS CAPSULE STUDY;  Surgeon: Danie Binder, MD;  [facility];  Service: Endoscopy;  Laterality: N/A;  7:30  . Esophagogastroduodenoscopy N/A 05/23/2015    Procedure: ESOPHAGOGASTRODUODENOSCOPY (EGD);  Surgeon: Danie Binder, MD;  [facility];  Service: Endoscopy;  Laterality: N/A;  1230  . Savory dilation N/A 05/23/2015    Procedure: SAVORY DILATION;  Surgeon: Danie Binder, MD;  [facility];  Service: Endoscopy;  Laterality: N/A;    Medications: Prior to Admission medications   Medication Sig Start Date End Date Taking? Authorizing Provider  alendronate (FOSAMAX) 70 MG tablet Take 70 mg by mouth once a week.  05/12/15  Yes Historical Provider, MD  allopurinol (ZYLOPRIM) 300 MG tablet Take 300 mg by mouth daily.  04/10/15  Yes Historical Provider, MD  cloNIDine (CATAPRES) 0.2 MG tablet Take 0.2 mg by mouth 3 (three) times daily.  04/11/15  Yes Historical Provider, MD  docusate sodium (COLACE) 100 MG capsule Take 100 mg by  mouth 2 (two) times daily.   Yes Historical Provider, MD  hydrALAZINE (APRESOLINE) 100 MG tablet Take 100 mg by mouth 3 (three) times daily. 04/09/15  Yes Historical Provider, MD  levothyroxine (SYNTHROID, LEVOTHROID) 50 MCG tablet Take 1 tablet (50 mcg total) by mouth daily. 04/17/15  Yes Samuella Cota, MD  lidocaine-prilocaine (EMLA) cream Apply 1 application topically as needed (on Dialysis days Tues,Thurs.,Sat.).   Yes Historical Provider, MD  meclizine (ANTIVERT) 25 MG tablet Take 1 tablet by mouth 4 (four) times daily as needed. dizziness 11/22/14  Yes Historical  Provider, MD  metoCLOPramide (REGLAN) 5 MG tablet 1 po before breakfast and lunch and at bedtime. Patient taking differently: Take 5 mg by mouth 4 (four) times daily -  before meals and at bedtime.  05/13/15  Yes Danie Binder, MD  NIFEdipine (PROCARDIA XL/ADALAT-CC) 90 MG 24 hr tablet Take 90 mg by mouth daily. 04/10/15  Yes Historical Provider, MD  omeprazole (PRILOSEC) 20 MG capsule Take 1 capsule (20 mg total) by mouth daily. 07/29/14  Yes Orvil Feil, NP  ondansetron (ZOFRAN) 4 MG tablet Take 4 mg by mouth every 8 (eight) hours as needed for nausea or vomiting.  04/28/15  Yes Historical Provider, MD  sevelamer carbonate (RENVELA) 800 MG tablet Take 800-2,400 mg by mouth 3 (three) times daily with meals. Patient takes 1 tablet with snack and 3 tablets with meal   Yes Historical Provider, MD    Allergies:   Allergies  Allergen Reactions  . Cephalexin Itching    Social History:  reports that she has never smoked. She has never used smokeless tobacco. She reports that she does not drink alcohol or use illicit drugs.  Family History: Family History  Problem Relation Age of Onset  . Colon cancer Neg Hx   . Cancer Mother   . Diabetes Mother   . Hypertension Mother   . Diabetes Father   . Hypertension Father   . Hypertension Sister     Physical Exam: Filed Vitals:   05/30/15 1936 05/30/15 2326 05/30/15 2330  BP: 214/81 190/78 188/77  Pulse: 82 77 75  Temp: 99 F (37.2 C)    TempSrc: Oral    Resp: 20 20 23   Height: 5\' 6"  (1.676 m)    Weight: 61.689 kg (136 lb)    SpO2: 100% 98% 98%   General appearance: alert, cooperative and no distress Head: Normocephalic, without obvious abnormality, atraumatic Eyes: negative Nose: Nares normal. Septum midline. Mucosa normal. No drainage or sinus tenderness. Neck: no JVD and supple, symmetrical, trachea midline Lungs: clear to auscultation bilaterally Heart: regular rate and rhythm, S1, S2 normal, no murmur, click, rub or  gallop Abdomen: soft, non-tender; bowel sounds normal; no masses,  no organomegaly Extremities: extremities normal, atraumatic, no cyanosis or edema  Left hip pain with any movement Pulses: 2+ and symmetric Skin: Skin color, texture, turgor normal. No rashes or lesions Neurologic: Grossly normal   Labs on Admission:   Recent Labs  05/30/15 2145  NA 135  K 4.7  CL 102  CO2 23  GLUCOSE 96  BUN 32*  CREATININE 6.73*  CALCIUM 9.2    Recent Labs  05/30/15 2145  WBC 6.8  NEUTROABS 5.0  HGB 10.5*  HCT 34.6*  MCV 76.9*  PLT 150    Radiological Exams on Admission: Dg Chest 1 View  05/30/2015  CLINICAL DATA:  Fall EXAM: CHEST 1 VIEW COMPARISON:  April 14, 2015 FINDINGS: There is no edema or consolidation.  Heart is enlarged with pulmonary vascularity within normal limits. No pneumothorax. No adenopathy. No bone lesions. IMPRESSION: Cardiomegaly.  No edema or consolidation. Electronically Signed   By: Lowella Grip III M.D.   On: 05/30/2015 21:38   Dg Hip Unilat With Pelvis 2-3 Views Left  05/30/2015  CLINICAL DATA:  Pain following fall EXAM: DG HIP (WITH OR WITHOUT PELVIS) 2-3V LEFT COMPARISON:  None. FINDINGS: Frontal pelvis as well as frontal and lateral left hip images were obtained. There is a fracture of the left femoral neck which extends to the subcapital region laterally with slight impaction along the lateral aspect of the subcapital femoral neck region on the left. No other fracture is apparent. No dislocation. There is mild symmetric narrowing of both hip joints. There are scattered foci of arterial vascular calcification bilaterally. IMPRESSION: Nondisplaced fracture of the left femoral neck with mild impaction at the subcapital femoral neck region with laterally on the left. No other fracture. No dislocation. There is mild symmetric narrowing of both hip joints. Electronically Signed   By: Lowella Grip III M.D.   On: 05/30/2015 20:44   Old chart  reviewed Case discussed with dr Ayesha Rumpf ekg reviewed no acute issues cxr reviewed no edema or infiltrate  Assessment/Plan  64 yo female dialysis patient with mechanical fall and new left nondisplaced femoral neck fracture with mild impaction  Principal Problem:   Fracture of femoral neck, left, closed-  Place on hip fx pathway.  [facility]o called at [facility] already and will see in am.  Hold anticoagulants and keep npo after midnight.  Will likely need her routine dialysis prior to surgery in am.  Give prn morphine.  Active Problems:   Essential hypertension- noted and elevated.  Given oral clonidine an hour ago, sbp still 190 will give a dose of hydralazine 20mg  iv now.     Anemia in chronic kidney disease- stable   End stage renal disease ([place])- left message for dialysis unit at [facility]   Admit to [facility], transfer from [facility] due to no ortho coverage.  Pt wishes to be DNR, no extreme measures irregardless of circumstance no cpr or intubation.  Discussed with her husband and daughter who are also present.  She is interested in discussing surgical options with ortho in the am.    Hien Perreira A 05/30/2015, 11:48 PM

## 2015-05-30 NOTE — ED Notes (Signed)
Report given to [facility] 

## 2015-05-30 NOTE — ED Notes (Signed)
Pt tripped over something in her home and is now c/o pain in her left hip and thigh area x 1 hr ago.

## 2015-05-30 NOTE — ED Provider Notes (Signed)
CSN: [id number]     Arrival date & time 05/30/15  1931 History   First MD Initiated Contact with Patient 05/30/15 1944     Chief Complaint  Patient presents with  . Fall     Patient is a 63 y.o. female presenting with fall. The history is provided by the patient and the spouse. No language interpreter was used.  Fall   Sonya [name] is a 64 year old woman with history of end-stage renal disease on dialysis here for evaluation of injuries following fall. Sonya [name] was walking in home and tripped falling onto her left side. This happened about 1:30 PM today. Sonya [name] denies any head injury or loss of consciousness. Sonya [name] denies any recent illnesses. No fevers no chest pain or shortness of breath no vomiting. Sonya [name] reports pain in her left hip and Sonya [name] is unable to bear weight secondary to pain. Sonya [name] receives dialysis Tuesday, Thursday, Saturday and is due for dialysis tomorrow. Sonya [name] takes no blood thinners.  Past Medical History  Diagnosis Date  . HTN (hypertension)   . Hypothyroidism   . Uterine cancer ([place]) 2012  . Anemia   . CKD (chronic kidney disease)   . Dysphagia   . Hyperparathyroidism due to renal insufficiency ([place])   . GERD (gastroesophageal reflux disease)   . Diverticulitis   . Vertical diplopia   . Vertigo    Past Surgical History  Procedure Laterality Date  . Cholecystectomy    . Cesarean section    . Laparoscopic total hysterectomy    . Colonoscopy  2000    TICS, IH  . Colonoscopy  2003 NUR BRBPR D50 V6    DC/[place] TICS, IH  . Abdominal hysterectomy    . Nasal septum surgery    . Esophagogastroduodenoscopy (egd) with esophageal dilation N/A 06/08/2013    Dr. Fields:Stricture at the gastroesophageal junction/MILD NON-erosive gastritis (inflammation) was found in the gastric antrum; multiple biopsies/NO SOURCE FOR ANEMIA IDETIFIED-MOST LIKELY DUE TO ANEMIA OF CHRONIC DISEASE  . Colonoscopy N/A 09/04/2013    Procedure: COLONOSCOPY;  Surgeon: Danie Binder, MD;  [facility];   Service: Endoscopy;  Laterality: N/A;  10:30AM-moved to 9:25 [name]  . Appendectomy    . Bascilic vein transposition Right 09/17/2013    Procedure: BRACHIAL VEIN TRANSPOSITION;  Surgeon: Conrad [place], MD;  Location: [place];  Service: Vascular;  Laterality: Right;  . Insertion of dialysis catheter Left 09/17/2013    Procedure: INSERTION OF DIALYSIS CATHETER;  Surgeon: Conrad [place], MD;  Location: [place];  Service: Vascular;  Laterality: Left;  . Bascilic vein transposition Right 10/29/2013    Procedure: RIGHT SECOND STAGE BRACHIAL VEIN TRANSPOSITION;  Surgeon: Conrad , MD;  Location: [facility] OR;  Service: Vascular;  Laterality: Right;  . Givens capsule study N/A 12/03/2013    Procedure: GIVENS CAPSULE STUDY;  Surgeon: Danie Binder, MD;  [facility];  Service: Endoscopy;  Laterality: N/A;  7:30  . Esophagogastroduodenoscopy N/A 05/23/2015    Procedure: ESOPHAGOGASTRODUODENOSCOPY (EGD);  Surgeon: Danie Binder, MD;  [facility];  Service: Endoscopy;  Laterality: N/A;  1230  . Savory dilation N/A 05/23/2015    Procedure: SAVORY DILATION;  Surgeon: Danie Binder, MD;  [facility];  Service: Endoscopy;  Laterality: N/A;   Family History  Problem Relation Age of Onset  . Colon cancer Neg Hx   . Cancer Mother   . Diabetes Mother   . Hypertension Mother   .  Diabetes Father   . Hypertension Father   . Hypertension Sister    Social History  Substance Use Topics  . Smoking status: Never Smoker   . Smokeless tobacco: Never Used  . Alcohol Use: No   OB History    Gravida Para Term Preterm AB TAB SAB Ectopic Multiple Living   1 1 1             Review of Systems  All other systems reviewed and are negative.     Allergies  Cephalexin  Home Medications   Prior to Admission medications   Medication Sig Start Date End Date Taking? Authorizing Provider  alendronate (FOSAMAX) 70 MG tablet Take 70 mg by mouth once a week.  05/12/15    Historical Provider, MD  cloNIDine (CATAPRES) 0.2 MG tablet  04/11/15   Historical Provider, MD  docusate sodium (COLACE) 100 MG capsule Take 100 mg by mouth 2 (two) times daily.    Historical Provider, MD  hydrALAZINE (APRESOLINE) 100 MG tablet Take 100 mg by mouth 3 (three) times daily. 04/09/15   Historical Provider, MD  labetalol (NORMODYNE) 300 MG tablet Take 1 tablet (300 mg total) by mouth 3 (three) times daily. 08/20/13   Kinnie Feil, MD  levothyroxine (SYNTHROID, LEVOTHROID) 50 MCG tablet Take 1 tablet (50 mcg total) by mouth daily. 04/17/15   Samuella Cota, MD  lidocaine-prilocaine (EMLA) cream Apply 1 application topically as needed (on Dialysis days Tues,Thurs.,Sat.).    Historical Provider, MD  meclizine (ANTIVERT) 25 MG tablet Take 1 tablet by mouth 4 (four) times daily as needed. dizziness 11/22/14   Historical Provider, MD  metoCLOPramide (REGLAN) 5 MG tablet 1 po before breakfast and lunch and at bedtime. 05/13/15   Danie Binder, MD  NIFEdipine (PROCARDIA XL/ADALAT-CC) 90 MG 24 hr tablet Take 90 mg by mouth daily. 04/10/15   Historical Provider, MD  omeprazole (PRILOSEC) 20 MG capsule Take 1 capsule (20 mg total) by mouth daily. 07/29/14   Orvil Feil, NP  ondansetron (ZOFRAN) 4 MG tablet Take 4 mg by mouth every 8 (eight) hours as needed for nausea or vomiting.  04/28/15   Historical Provider, MD  sevelamer carbonate (RENVELA) 800 MG tablet Take 800-2,400 mg by mouth 3 (three) times daily with meals. Patient takes 1 tablet with snack and 3 tablets with meal    Historical Provider, MD  sodium bicarbonate 650 MG tablet Take 1 tablet (650 mg total) by mouth 2 (two) times daily. Patient taking differently: Take 650 mg by mouth daily.  06/12/13   Kathie Dike, MD   BP 214/81 mmHg  Pulse 82  Temp(Src) 99 F (37.2 C) (Oral)  Resp 20  Ht 5\' 6"  (1.676 m)  Wt 136 lb (61.689 kg)  BMI 21.96 kg/m2  SpO2 100% Physical Exam  Constitutional: Sonya [name] is oriented to person, place, and time.  Sonya [name] appears well-developed and well-nourished.  HENT:  Head: Normocephalic and atraumatic.  Cardiovascular: Normal rate and regular rhythm.   No murmur heard. Pulmonary/Chest: Effort normal and breath sounds normal. No respiratory distress.  Abdominal: Soft. There is no tenderness. There is no rebound and no guarding.  Musculoskeletal: Sonya [name] exhibits no edema.  Tender to palpation over the left inguinal crease on the left lateral hip. Decreased range of motion of the left hip secondary to pain. 2+ DP pulses. Fistula in right upper extremity.  Neurological: Sonya [name] is alert and oriented to person, place, and time.  Generalized weakness  Skin: Skin is warm and  dry.  Psychiatric: Sonya [name] has a normal mood and affect. Her behavior is normal.  Nursing note and vitals reviewed.   ED Course  Procedures (including critical care time) Labs Review Labs Reviewed  BASIC METABOLIC PANEL  CBC WITH DIFFERENTIAL/PLATELET  PROTIME-INR    Imaging Review Dg Chest 1 View  05/30/2015  CLINICAL DATA:  Fall EXAM: CHEST 1 VIEW COMPARISON:  April 14, 2015 FINDINGS: There is no edema or consolidation. Heart is enlarged with pulmonary vascularity within normal limits. No pneumothorax. No adenopathy. No bone lesions. IMPRESSION: Cardiomegaly.  No edema or consolidation. Electronically Signed   By: Lowella Grip III M.D.   On: 05/30/2015 21:38   Dg Hip Unilat With Pelvis 2-3 Views Left  05/30/2015  CLINICAL DATA:  Pain following fall EXAM: DG HIP (WITH OR WITHOUT PELVIS) 2-3V LEFT COMPARISON:  None. FINDINGS: Frontal pelvis as well as frontal and lateral left hip images were obtained. There is a fracture of the left femoral neck which extends to the subcapital region laterally with slight impaction along the lateral aspect of the subcapital femoral neck region on the left. No other fracture is apparent. No dislocation. There is mild symmetric narrowing of both hip joints. There are scattered foci of arterial  vascular calcification bilaterally. IMPRESSION: Nondisplaced fracture of the left femoral neck with mild impaction at the subcapital femoral neck region with laterally on the left. No other fracture. No dislocation. There is mild symmetric narrowing of both hip joints. Electronically Signed   By: Lowella Grip III M.D.   On: 05/30/2015 20:44   I have personally reviewed and evaluated these images and lab results as part of my medical decision-making.   EKG Interpretation   Date/Time:  Friday May 30 2015 21:37:24 EDT Ventricular Rate:  75 PR Interval:  166 QRS Duration: 82 QT Interval:  393 QTC Calculation: 439 R Axis:   54 Text Interpretation:  Sinus rhythm Abnormal R-wave progression, early  transition Consider left ventricular hypertrophy Baseline wander in  lead(s) I II aVR Confirmed by Hazle Coca [id number]) on 05/30/2015 9:59:38 PM      MDM   Final diagnoses:  Closed fracture of neck of left femur, initial encounter [facility])    Patient here for evaluation of injuries following mechanical fall. Sonya [name] has tenderness and decreased range of motion of the left hip. Plain films demonstrate nondisplaced femoral neck fracture. There is no Orthopedic Surgeon at [facility] this weekend.  Discussed with Dr. Erlinda Hong with orthopedics at [facility], agrees to see the patient in consult, possible surgery tomorrow. Hospitalist consultated regarding admission to [facility] for management of her medical comorbidities.    Quintella Reichert, MD 05/30/15 2207

## 2015-05-30 NOTE — ED Notes (Signed)
Pt gave verbal permission to be transported to [facility], pt's daughter Garrie Disotell signed for pt,

## 2015-05-31 ENCOUNTER — Encounter (HOSPITAL_COMMUNITY): Payer: Self-pay | Admitting: General Practice

## 2015-05-31 ENCOUNTER — Inpatient Hospital Stay (HOSPITAL_COMMUNITY): Payer: Medicare Other

## 2015-05-31 ENCOUNTER — Encounter (HOSPITAL_COMMUNITY)
Admission: EM | Disposition: A | Discharge status: Skilled Nursing Facility | Payer: Self-pay | Source: Home / Self Care | Attending: Internal Medicine

## 2015-05-31 ENCOUNTER — Inpatient Hospital Stay (HOSPITAL_COMMUNITY): Payer: Medicare Other | Admitting: Certified Registered Nurse Anesthetist

## 2015-05-31 HISTORY — PX: HIP PINNING,CANNULATED: SHX1758

## 2015-05-31 LAB — SURGICAL PCR SCREEN
MRSA, PCR: NEGATIVE
Staphylococcus aureus: NEGATIVE

## 2015-05-31 LAB — POCT I-STAT 4, (NA,K, GLUC, HGB,HCT)
GLUCOSE: 79 mg/dL (ref 65–99)
HEMATOCRIT: 40 % (ref 36.0–46.0)
HEMOGLOBIN: 13.6 g/dL (ref 12.0–15.0)
Potassium: 4.9 mmol/L (ref 3.5–5.1)
SODIUM: 135 mmol/L (ref 135–145)

## 2015-05-31 LAB — TYPE AND SCREEN
ABO/RH(D): B POS
Antibody Screen: NEGATIVE

## 2015-05-31 LAB — ABO/RH: ABO/RH(D): B POS

## 2015-05-31 SURGERY — FIXATION, FEMUR, NECK, PERCUTANEOUS, USING SCREW
Anesthesia: General | Site: Hip | Laterality: Left

## 2015-05-31 MED ORDER — NIFEDIPINE ER OSMOTIC RELEASE 90 MG PO TB24
90.0000 mg | ORAL_TABLET | Freq: Every day | ORAL | Status: DC
  Administered 2015-05-31 – 2015-06-04 (×5): 90 mg via ORAL
  Filled 2015-05-31 (×5): qty 1

## 2015-05-31 MED ORDER — ONDANSETRON HCL 4 MG PO TABS: 4.0000 mg | ORAL_TABLET | Freq: Four times a day (QID) | ORAL | Status: DC | PRN

## 2015-05-31 MED ORDER — ONDANSETRON HCL 4 MG/2ML IJ SOLN: 4.0000 mg | Freq: Four times a day (QID) | INTRAMUSCULAR | Status: DC | PRN

## 2015-05-31 MED ORDER — MORPHINE SULFATE (PF) 2 MG/ML IV SOLN
0.5000 mg | INTRAVENOUS | Status: DC | PRN
  Administered 2015-05-31: 0.5 mg via INTRAVENOUS
  Filled 2015-05-31: qty 1

## 2015-05-31 MED ORDER — DEXAMETHASONE SODIUM PHOSPHATE 4 MG/ML IJ SOLN
INTRAMUSCULAR | Status: AC
  Filled 2015-05-31: qty 1

## 2015-05-31 MED ORDER — MIDAZOLAM HCL 2 MG/2ML IJ SOLN
INTRAMUSCULAR | Status: AC
  Filled 2015-05-31: qty 4

## 2015-05-31 MED ORDER — PROPOFOL 10 MG/ML IV BOLUS
INTRAVENOUS | Status: DC | PRN
  Administered 2015-05-31: 150 mg via INTRAVENOUS

## 2015-05-31 MED ORDER — ALBUTEROL SULFATE (2.5 MG/3ML) 0.083% IN NEBU: 2.5000 mg | INHALATION_SOLUTION | RESPIRATORY_TRACT | Status: DC | PRN

## 2015-05-31 MED ORDER — ONDANSETRON HCL 4 MG/2ML IJ SOLN
4.0000 mg | Freq: Once | INTRAMUSCULAR | Status: DC | PRN
Start: 2015-05-31 — End: 2015-05-31

## 2015-05-31 MED ORDER — CEFAZOLIN SODIUM-DEXTROSE 2-3 GM-% IV SOLR
INTRAVENOUS | Status: AC
  Filled 2015-05-31: qty 50

## 2015-05-31 MED ORDER — ONDANSETRON HCL 4 MG/2ML IJ SOLN
INTRAMUSCULAR | Status: DC | PRN
  Administered 2015-05-31: 4 mg via INTRAVENOUS

## 2015-05-31 MED ORDER — DOXERCALCIFEROL 2.5 MCG PO CAPS
2.5000 ug | ORAL_CAPSULE | ORAL | Status: DC
  Administered 2015-06-02: 2.5 ug via ORAL
  Filled 2015-05-31 (×2): qty 1

## 2015-05-31 MED ORDER — DEXTROSE 5 % IV SOLN: 500.0000 mg | Freq: Four times a day (QID) | INTRAVENOUS | Status: DC | PRN

## 2015-05-31 MED ORDER — CLONIDINE HCL 0.2 MG PO TABS: 0.2000 mg | ORAL_TABLET | Freq: Three times a day (TID) | ORAL | Status: DC

## 2015-05-31 MED ORDER — METHOCARBAMOL 1000 MG/10ML IJ SOLN
500.0000 mg | Freq: Four times a day (QID) | INTRAVENOUS | Status: DC | PRN
  Filled 2015-05-31: qty 5

## 2015-05-31 MED ORDER — DEXAMETHASONE SODIUM PHOSPHATE 4 MG/ML IJ SOLN
INTRAMUSCULAR | Status: DC | PRN
  Administered 2015-05-31: 4 mg via INTRAVENOUS

## 2015-05-31 MED ORDER — ENOXAPARIN SODIUM 30 MG/0.3ML ~~LOC~~ SOLN: 30.0000 mg | SUBCUTANEOUS | Status: DC

## 2015-05-31 MED ORDER — MEPERIDINE HCL 25 MG/ML IJ SOLN: 6.2500 mg | INTRAMUSCULAR | Status: DC | PRN

## 2015-05-31 MED ORDER — PHENOL 1.4 % MT LIQD
1.0000 | OROMUCOSAL | Status: DC | PRN
  Filled 2015-05-31: qty 177

## 2015-05-31 MED ORDER — HYDROMORPHONE HCL 1 MG/ML IJ SOLN: 0.2500 mg | INTRAMUSCULAR | Status: DC | PRN

## 2015-05-31 MED ORDER — HYDRALAZINE HCL 50 MG PO TABS
100.0000 mg | ORAL_TABLET | Freq: Three times a day (TID) | ORAL | Status: DC
  Administered 2015-05-31 – 2015-06-03 (×10): 100 mg via ORAL
  Filled 2015-05-31 (×12): qty 2

## 2015-05-31 MED ORDER — METOCLOPRAMIDE HCL 5 MG/ML IJ SOLN: 5.0000 mg | Freq: Three times a day (TID) | INTRAMUSCULAR | Status: DC | PRN

## 2015-05-31 MED ORDER — MORPHINE SULFATE (PF) 2 MG/ML IV SOLN: 0.5000 mg | INTRAVENOUS | Status: DC | PRN

## 2015-05-31 MED ORDER — MENTHOL 3 MG MT LOZG
1.0000 | LOZENGE | OROMUCOSAL | Status: DC | PRN
  Filled 2015-05-31 (×2): qty 9

## 2015-05-31 MED ORDER — METOCLOPRAMIDE HCL 5 MG PO TABS: 5.0000 mg | ORAL_TABLET | Freq: Three times a day (TID) | ORAL | Status: DC | PRN

## 2015-05-31 MED ORDER — PANTOPRAZOLE SODIUM 40 MG PO TBEC
40.0000 mg | DELAYED_RELEASE_TABLET | Freq: Every day | ORAL | Status: DC
  Administered 2015-05-31 – 2015-06-04 (×5): 40 mg via ORAL
  Filled 2015-05-31 (×5): qty 1

## 2015-05-31 MED ORDER — DARBEPOETIN ALFA 40 MCG/0.4ML IJ SOSY: 40.0000 ug | PREFILLED_SYRINGE | INTRAMUSCULAR | Status: DC

## 2015-05-31 MED ORDER — METHOCARBAMOL 500 MG PO TABS
500.0000 mg | ORAL_TABLET | Freq: Four times a day (QID) | ORAL | Status: DC | PRN
  Administered 2015-05-31: 500 mg via ORAL
  Filled 2015-05-31: qty 1

## 2015-05-31 MED ORDER — ONDANSETRON HCL 4 MG/2ML IJ SOLN
4.0000 mg | Freq: Four times a day (QID) | INTRAMUSCULAR | Status: DC | PRN
Start: 2015-05-31 — End: 2015-05-31

## 2015-05-31 MED ORDER — METOCLOPRAMIDE HCL 5 MG PO TABS
5.0000 mg | ORAL_TABLET | Freq: Three times a day (TID) | ORAL | Status: DC
  Administered 2015-06-01 – 2015-06-04 (×9): 5 mg via ORAL
  Filled 2015-05-31 (×10): qty 1

## 2015-05-31 MED ORDER — DARBEPOETIN ALFA 40 MCG/0.4ML IJ SOSY
40.0000 ug | PREFILLED_SYRINGE | INTRAMUSCULAR | Status: DC
  Filled 2015-05-31: qty 0.4

## 2015-05-31 MED ORDER — SODIUM CHLORIDE 0.9 % IV SOLN
INTRAVENOUS | Status: DC
  Administered 2015-05-31: 07:00:00 via INTRAVENOUS

## 2015-05-31 MED ORDER — LEVOTHYROXINE SODIUM 50 MCG PO TABS
50.0000 ug | ORAL_TABLET | Freq: Every day | ORAL | Status: DC
  Administered 2015-06-01 – 2015-06-04 (×4): 50 ug via ORAL
  Filled 2015-05-31 (×4): qty 1

## 2015-05-31 MED ORDER — FENTANYL CITRATE (PF) 250 MCG/5ML IJ SOLN
INTRAMUSCULAR | Status: AC
  Filled 2015-05-31: qty 5

## 2015-05-31 MED ORDER — CEFAZOLIN SODIUM-DEXTROSE 2-3 GM-% IV SOLR
2.0000 g | Freq: Once | INTRAVENOUS | Status: AC
  Administered 2015-05-31: 2 g via INTRAVENOUS

## 2015-05-31 MED ORDER — ALUM & MAG HYDROXIDE-SIMETH 200-200-20 MG/5ML PO SUSP: 30.0000 mL | ORAL | Status: DC | PRN

## 2015-05-31 MED ORDER — ACETAMINOPHEN 650 MG RE SUPP: 650.0000 mg | Freq: Four times a day (QID) | RECTAL | Status: DC | PRN

## 2015-05-31 MED ORDER — CLONIDINE HCL 0.2 MG PO TABS
0.2000 mg | ORAL_TABLET | Freq: Three times a day (TID) | ORAL | Status: DC
Start: 2015-05-31 — End: 2015-06-01
  Administered 2015-05-31 – 2015-06-01 (×4): 0.2 mg via ORAL
  Filled 2015-05-31 (×4): qty 1

## 2015-05-31 MED ORDER — LABETALOL HCL 300 MG PO TABS
300.0000 mg | ORAL_TABLET | Freq: Two times a day (BID) | ORAL | Status: DC
  Administered 2015-05-31 – 2015-06-04 (×8): 300 mg via ORAL
  Filled 2015-05-31 (×10): qty 1

## 2015-05-31 MED ORDER — SEVELAMER CARBONATE 800 MG PO TABS
2400.0000 mg | ORAL_TABLET | Freq: Three times a day (TID) | ORAL | Status: DC
  Administered 2015-05-31 – 2015-06-04 (×8): 2400 mg via ORAL
  Filled 2015-05-31 (×11): qty 3

## 2015-05-31 MED ORDER — ONDANSETRON HCL 4 MG/2ML IJ SOLN
INTRAMUSCULAR | Status: AC
  Filled 2015-05-31: qty 2

## 2015-05-31 MED ORDER — METHOCARBAMOL 500 MG PO TABS
500.0000 mg | ORAL_TABLET | Freq: Four times a day (QID) | ORAL | Status: DC | PRN
  Administered 2015-05-31 – 2015-06-03 (×3): 500 mg via ORAL
  Filled 2015-05-31 (×3): qty 1

## 2015-05-31 MED ORDER — CLONIDINE HCL 0.1 MG PO TABS: 0.1000 mg | ORAL_TABLET | ORAL | Status: DC | PRN

## 2015-05-31 MED ORDER — OXYCODONE HCL 5 MG PO TABS
5.0000 mg | ORAL_TABLET | Freq: Four times a day (QID) | ORAL | Status: DC | PRN
  Administered 2015-05-31: 10 mg via ORAL
  Filled 2015-05-31: qty 2

## 2015-05-31 MED ORDER — FENTANYL CITRATE (PF) 100 MCG/2ML IJ SOLN
INTRAMUSCULAR | Status: DC | PRN
  Administered 2015-05-31: 100 ug via INTRAVENOUS
  Administered 2015-05-31: 150 ug via INTRAVENOUS

## 2015-05-31 MED ORDER — HYDROCODONE-ACETAMINOPHEN 5-325 MG PO TABS
1.0000 | ORAL_TABLET | Freq: Four times a day (QID) | ORAL | Status: DC | PRN
  Administered 2015-06-01: 1 via ORAL
  Filled 2015-05-31: qty 1

## 2015-05-31 MED ORDER — CEFAZOLIN SODIUM-DEXTROSE 2-3 GM-% IV SOLR
2.0000 g | Freq: Once | INTRAVENOUS | Status: AC
  Administered 2015-06-01: 2 g via INTRAVENOUS
  Filled 2015-05-31: qty 50

## 2015-05-31 MED ORDER — HYDROCODONE-ACETAMINOPHEN 5-325 MG PO TABS
1.0000 | ORAL_TABLET | Freq: Four times a day (QID) | ORAL | Status: DC | PRN
  Administered 2015-05-31 (×2): 2 via ORAL
  Filled 2015-05-31 (×2): qty 2

## 2015-05-31 MED ORDER — HYDROCODONE-ACETAMINOPHEN 7.5-325 MG PO TABS: 1.0000 | ORAL_TABLET | Freq: Four times a day (QID) | ORAL | Status: DC | PRN

## 2015-05-31 MED ORDER — CLONIDINE HCL 0.1 MG PO TABS
0.1000 mg | ORAL_TABLET | Freq: Once | ORAL | Status: AC
  Administered 2015-05-31: 0.1 mg via ORAL
  Filled 2015-05-31: qty 1

## 2015-05-31 MED ORDER — METOCLOPRAMIDE HCL 5 MG PO TABS: 5.0000 mg | ORAL_TABLET | Freq: Three times a day (TID) | ORAL | Status: DC

## 2015-05-31 MED ORDER — SODIUM CHLORIDE 0.9 % IV SOLN: INTRAVENOUS | Status: DC

## 2015-05-31 MED ORDER — ACETAMINOPHEN 500 MG PO TABS
1000.0000 mg | ORAL_TABLET | Freq: Three times a day (TID) | ORAL | Status: DC
  Administered 2015-05-31 – 2015-06-04 (×11): 1000 mg via ORAL
  Filled 2015-05-31 (×12): qty 2

## 2015-05-31 MED ORDER — ALLOPURINOL 300 MG PO TABS
150.0000 mg | ORAL_TABLET | Freq: Every day | ORAL | Status: DC
  Administered 2015-05-31 – 2015-06-04 (×5): 150 mg via ORAL
  Filled 2015-05-31 (×5): qty 1

## 2015-05-31 MED ORDER — ENOXAPARIN SODIUM 30 MG/0.3ML ~~LOC~~ SOLN
30.0000 mg | SUBCUTANEOUS | Status: DC
  Administered 2015-06-01 – 2015-06-04 (×3): 30 mg via SUBCUTANEOUS
  Filled 2015-05-31 (×3): qty 0.3

## 2015-05-31 MED ORDER — 0.9 % SODIUM CHLORIDE (POUR BTL) OPTIME
TOPICAL | Status: DC | PRN
  Administered 2015-05-31: 1000 mL

## 2015-05-31 MED ORDER — OXYCODONE HCL 5 MG PO TABS
5.0000 mg | ORAL_TABLET | ORAL | Status: DC | PRN
  Administered 2015-05-31: 10 mg via ORAL
  Administered 2015-06-01: 5 mg via ORAL
  Administered 2015-06-01: 10 mg via ORAL
  Administered 2015-06-02 – 2015-06-03 (×5): 5 mg via ORAL
  Filled 2015-05-31: qty 2
  Filled 2015-05-31 (×5): qty 1
  Filled 2015-05-31 (×2): qty 2

## 2015-05-31 MED ORDER — ACETAMINOPHEN 325 MG PO TABS: 650.0000 mg | ORAL_TABLET | Freq: Four times a day (QID) | ORAL | Status: DC | PRN

## 2015-05-31 MED ORDER — SUCCINYLCHOLINE CHLORIDE 20 MG/ML IJ SOLN
INTRAMUSCULAR | Status: DC | PRN
  Administered 2015-05-31: 120 mg via INTRAVENOUS

## 2015-05-31 MED ORDER — POLYETHYLENE GLYCOL 3350 17 G PO PACK: 17.0000 g | PACK | Freq: Every day | ORAL | Status: DC

## 2015-05-31 MED ORDER — HEPARIN SODIUM (PORCINE) 5000 UNIT/ML IJ SOLN
5000.0000 [IU] | Freq: Three times a day (TID) | INTRAMUSCULAR | Status: DC
  Filled 2015-05-31: qty 1

## 2015-05-31 MED ORDER — LIDOCAINE HCL (CARDIAC) 20 MG/ML IV SOLN
INTRAVENOUS | Status: DC | PRN
  Administered 2015-05-31: 100 mg via INTRAVENOUS

## 2015-05-31 SURGICAL SUPPLY — 33 items
BIT DRILL CANN 5.2 (DRILL) ×2 IMPLANT
BNDG COHESIVE 4X5 TAN STRL (GAUZE/BANDAGES/DRESSINGS) ×3 IMPLANT
BNDG GAUZE ELAST 4 BULKY (GAUZE/BANDAGES/DRESSINGS) ×3 IMPLANT
COVER PERINEAL POST (MISCELLANEOUS) ×3 IMPLANT
DRAPE STERI IOBAN 125X83 (DRAPES) ×3 IMPLANT
DRSG ADAPTIC 3X8 NADH LF (GAUZE/BANDAGES/DRESSINGS) ×3 IMPLANT
DRSG MEPILEX BORDER 4X4 (GAUZE/BANDAGES/DRESSINGS) ×3 IMPLANT
DURAPREP 26ML APPLICATOR (WOUND CARE) ×3 IMPLANT
ELECT CAUTERY BLADE 6.4 (BLADE) ×3 IMPLANT
ELECT REM PT RETURN 9FT ADLT (ELECTROSURGICAL) ×3
ELECTRODE REM PT RTRN 9FT ADLT (ELECTROSURGICAL) ×1 IMPLANT
GLOVE NEODERM STRL 7.5 LF PF (GLOVE) ×2 IMPLANT
GLOVE SURG NEODERM 7.5  LF PF (GLOVE) ×4
GOWN STRL REIN XL XLG (GOWN DISPOSABLE) ×9 IMPLANT
GUIDEPIN 3.2 (PIN) ×9 IMPLANT
KIT BASIN OR (CUSTOM PROCEDURE TRAY) ×3 IMPLANT
KIT ROOM TURNOVER OR (KITS) ×3 IMPLANT
LINER BOOT UNIVERSAL DISP (MISCELLANEOUS) ×3 IMPLANT
MANIFOLD NEPTUNE II (INSTRUMENTS) ×3 IMPLANT
NS IRRIG 1000ML POUR BTL (IV SOLUTION) ×3 IMPLANT
PACK GENERAL/GYN (CUSTOM PROCEDURE TRAY) ×3 IMPLANT
PAD ARMBOARD 7.5X6 YLW CONV (MISCELLANEOUS) ×6 IMPLANT
SCREW CANN PT 7.0X90 32MM HIP (Screw) ×3 IMPLANT
SCREW PARTIAL THREAD 32X75 (Screw) ×3 IMPLANT
SCREW PARTIAL THREAD 32X80 (Screw) ×2 IMPLANT
STAPLER VISISTAT 35W (STAPLE) ×3 IMPLANT
SUT VIC AB 0 CT1 27 (SUTURE) ×3
SUT VIC AB 0 CT1 27XBRD ANBCTR (SUTURE) ×1 IMPLANT
SUT VIC AB 2-0 CT1 27 (SUTURE) ×3
SUT VIC AB 2-0 CT1 TAPERPNT 27 (SUTURE) ×1 IMPLANT
TOWEL OR 17X24 6PK STRL BLUE (TOWEL DISPOSABLE) ×3 IMPLANT
TOWEL OR 17X26 10 PK STRL BLUE (TOWEL DISPOSABLE) ×3 IMPLANT
WATER STERILE IRR 1000ML POUR (IV SOLUTION) ×3 IMPLANT

## 2015-05-31 NOTE — Anesthesia Procedure Notes (Signed)
Procedure Name: Intubation Date/Time: 05/31/2015 7:46 AM Performed by: Rogers Blocker Pre-anesthesia Checklist: Patient identified, Timeout performed, Emergency Drugs available, Suction available and Patient being monitored Patient Re-evaluated:Patient Re-evaluated prior to inductionOxygen Delivery Method: Circle system utilized Preoxygenation: Pre-oxygenation with 100% oxygen Intubation Type: IV induction Ventilation: Mask ventilation without difficulty Laryngoscope Size: Mac and 4 Grade View: Grade I Tube type: Oral Tube size: 7.5 mm Number of attempts: 1 Airway Equipment and Method: Stylet Placement Confirmation: ETT inserted through vocal cords under direct vision,  breath sounds checked- equal and bilateral,  positive ETCO2 and CO2 detector Secured at: 22 cm Tube secured with: Tape Dental Injury: Teeth and Oropharynx as per pre-operative assessment

## 2015-05-31 NOTE — Op Note (Signed)
   Date of Surgery: 05/31/2015  INDICATIONS: Sonya [name] is a 64 y.o.-year-old female who sustained a hip fracture; she was indicated for open reduction and internal fixation due to the displaced nature of the fracture and came to the operating room today for this procedure. The patient did consent to the procedure after discussion of the risks and benefits.   PREOPERATIVE DIAGNOSIS: left hip fracture  POSTOPERATIVE DIAGNOSIS: Same.  PROCEDURE: Open treatment of proximal end of femur, neck with internal fixation. CPT [phone].  SURGEON: N. Eduard Roux, M.D.  ASSIST: none,  ANESTHESIA: general  IV FLUIDS AND URINE: See anesthesia.  ESTIMATED BLOOD LOSS: minimal mL.  IMPLANTS: Tamala Julian and nephew   DRAINS: None.  COMPLICATIONS: None.  DESCRIPTION OF PROCEDURE: The patient was brought to the operating room and placed supine on the operating table.  The patient had been signed prior to the procedure and this was documented. The patient had the anesthesia placed by the anesthesiologist.  The prep verification and incision time-outs were performed to confirm that this was the correct patient, site, side and location. The patient had SCDs in place. The patient did receive antibiotics prior to the incision and was redosed during the procedure as needed at indicated intervals.  The patient's well leg was placed in a flexed lithotomy position and carefully padded.  The operative leg was placed in traction and also well-padded.  Care was taken to confirm radiographs before prepping and draping. The hip was prepped and draped in the standard fashion.  Screws were placed using the following technique.  The 0.062" Kirschner wire was first placed and confirmed to be in the proper location on both AP and lateral views. After the guidewire was placed, the skin incision was made over the guidewire, then the 4.5 mm cannulated drill was started over the wire.  The drill was used to drill the bony corridor, again  confirming during drilling on both views. The final cannulated screw guidewire was then placed and again confirmed in position on both views.  The measuring stick was used to measure the length of screws.  This was repeated for all screws.  The approach-withdraw phenomenon was visualized under live fluoroscopy to confirm that all screws were of the appropriate length and in the head.   All wounds were copiously irrigated with saline and then the skin was reapproximated with staples. The wounds were cleaned and dried a final time and a sterile dressing. The patient was then transferred back to the bed and left the operating room in stable condition.  All sponge and instrument counts were correct.  POSTOPERATIVE PLAN: Sonya [name] will be weight bearing as tolerated; she will return for staple removal 2 weeks. Sonya [name] will receive DVT prophylaxis based on other medications, activity level, and risk ratio of bleeding to thrombosis per the primary team; if possible, we would prefer lovenox.

## 2015-05-31 NOTE — Transfer of Care (Signed)
Immediate Anesthesia Transfer of Care Note  Patient: [name]  Procedure(s) Performed: Procedure(s): CANNULATED HIP PINNING (Left)  Patient Location: PACU  Anesthesia Type:General  Level of Consciousness: awake, alert , oriented and patient cooperative  Airway & Oxygen Therapy: Patient Spontanous Breathing and Patient connected to face mask oxygen  Post-op Assessment: Report given to RN, Post -op Vital signs reviewed and stable and Patient moving all extremities X 4  Post vital signs: Reviewed and stable  Last Vitals:  Filed Vitals:   05/31/15 0607  BP: 186/72  Pulse: 73  Temp: 37.2 C  Resp: 18    Complications: No apparent anesthesia complications

## 2015-05-31 NOTE — Anesthesia Postprocedure Evaluation (Signed)
Anesthesia Post Note  Patient: Sonya [name]  Procedure(s) Performed: Procedure(s) (LRB): CANNULATED HIP PINNING (Left)  Anesthesia type: general  Patient location: PACU  Post pain: Pain level controlled  Post assessment: Patient's Cardiovascular Status Stable  Last Vitals:  Filed Vitals:   05/31/15 0915  BP: 210/85  Pulse: 73  Temp:   Resp: 15    Post vital signs: Reviewed and stable  Level of consciousness: sedated  Complications: No apparent anesthesia complications

## 2015-05-31 NOTE — Progress Notes (Addendum)
PATIENT DETAILS Name: Sonya [name] Age: 63 y.o. Sex: female Date of Birth: [birth year] Admit Date: 05/30/2015 Admitting Physician Phillips Grout, MD [id number] J., MD  Subjective: Just got back from the OR-somewhat lethargic- pain appears to be controlled.  Assessment/Plan: Principal Problem: Fracture of femoral neck, left, closed: Secondary to a mechanical fall. No syncopal episode. Reviewed op note-given ESRD-Will ask pharmacy to see if Lovenox can be dosed for VTE prophylaxis. Weightbearing as tolerated, PT eval, minimize narcotics, place on bowel regimen.  Active Problems: ESRD: On hemodialysis-TTS-renal following  Hypertension:Uncontrolled-could be from rebound hypertension-resume her clonidine, and continue labetalol, Procardia and hydralazine. Follow and adjust accordingly  Anemia in chronic kidney disease: On darbepoetin per nephrology. Hemoglobin currently close to usual baseline-however is at risk of perioperative blood loss anemia. Recheck CBC in a.m  Hypothyroidism: Continue levothyroxine  Gout: Continue with allopurinol  GERD: Continue PPI  ? History of gastroparesis: Recently seen by gastroenterology-recommendations are to start Reglan before meals-continue   Disposition: Remain inpatient  Antimicrobial agents  See below  Anti-infectives    Start     Dose/Rate Route Frequency Ordered Stop   05/31/15 0815  ceFAZolin (ANCEF) IVPB 2 g/50 mL premix     2 g 100 mL/hr over 30 Minutes Intravenous  Once 05/31/15 0807 05/31/15 0837      DVT Prophylaxis: Prophylactic Lovenox -dose per pharmacy  Code Status: Full code   Family Communication Spouse/daughter at bedside  Procedures: 10/29>>Open treatment of proximal end of femur, neck with internal fixation.  CONSULTS:  nephrology and orthopedic surgery  Time spent 30 minutes-Greater than 50% of this time was spent in counseling, explanation of diagnosis, planning of further  management, and coordination of care.  MEDICATIONS: Scheduled Meds: . [START ON 06/07/2015] darbepoetin (ARANESP) injection - DIALYSIS  40 mcg Intravenous Q Sat-HD  . [START ON 06/02/2015] doxercalciferol  2.5 mcg Oral Once per day on Mon Wed Fri  . hydrALAZINE  100 mg Oral TID  . labetalol  300 mg Oral BID  . NIFEdipine  90 mg Oral Daily  . sevelamer carbonate  2,400 mg Oral TID WC   Continuous Infusions:  PRN Meds:.cloNIDine, HYDROcodone-acetaminophen, HYDROmorphone (DILAUDID) injection, meperidine (DEMEROL) injection, methocarbamol **OR** methocarbamol (ROBAXIN)  IV, morphine injection, ondansetron (ZOFRAN) IV    PHYSICAL EXAM: Vital signs in last 24 hours: Filed Vitals:   05/31/15 0847 05/31/15 0900 05/31/15 0915 05/31/15 1018  BP: 208/82 209/85 210/85 211/85  Pulse: 72 71 73 70  Temp: 97.8 F (36.6 C)  97.6 F (36.4 C) 97.8 F (36.6 C)  TempSrc:    Oral  Resp: 8 14 15 18   Height:      Weight:      SpO2: 96% 100% 100% 100%    Weight change:  Filed Weights   05/30/15 1936  Weight: 61.689 kg (136 lb)   Body mass index is 21.96 kg/(m^2).   Gen Exam: Awake and alert with clear speech.   Neck: Supple, No JVD.  Chest: B/L Clear.   CVS: S1 S2 Regular, no murmurs.  Abdomen: soft, BS +, non tender, non distended.  Extremities: no edema, lower extremities warm to touch Neurologic: Non Focal.   Skin: No Rash.   Wounds: N/A.    Intake/Output from previous day:  Intake/Output Summary (Last 24 hours) at 05/31/15 1155 Last data filed at 05/31/15 0849  Gross per 24 hour  Intake     50  ml  Output     20 ml  Net     30 ml     LAB RESULTS: CBC  Recent Labs Lab 05/30/15 2145 05/31/15 0701  WBC 6.8  --   HGB 10.5* 13.6  HCT 34.6* 40.0  PLT 150  --   MCV 76.9*  --   MCH 23.3*  --   MCHC 30.3  --   RDW 22.2*  --   LYMPHSABS 1.1  --   MONOABS 0.6  --   EOSABS 0.1  --   BASOSABS 0.0  --     Chemistries   Recent Labs Lab 05/30/15 2145 05/31/15 0701  NA  135 135  K 4.7 4.9  CL 102  --   CO2 23  --   GLUCOSE 96 79  BUN 32*  --   CREATININE 6.73*  --   CALCIUM 9.2  --     CBG: No results for input(s): GLUCAP in the last 168 hours.  GFR Estimated Creatinine Clearance: 7.7 mL/min (by C-G formula based on Cr of 6.73).  Coagulation profile  Recent Labs Lab 05/30/15 2145  INR 1.23    Cardiac Enzymes No results for input(s): CKMB, TROPONINI, MYOGLOBIN in the last 168 hours.  Invalid input(s): CK  Invalid input(s): POCBNP No results for input(s): DDIMER in the last 72 hours. No results for input(s): HGBA1C in the last 72 hours. No results for input(s): CHOL, HDL, LDLCALC, TRIG, CHOLHDL, LDLDIRECT in the last 72 hours. No results for input(s): TSH, T4TOTAL, T3FREE, THYROIDAB in the last 72 hours.  Invalid input(s): FREET3 No results for input(s): VITAMINB12, FOLATE, FERRITIN, TIBC, IRON, RETICCTPCT in the last 72 hours. No results for input(s): LIPASE, AMYLASE in the last 72 hours.  Urine Studies No results for input(s): UHGB, CRYS in the last 72 hours.  Invalid input(s): UACOL, UAPR, USPG, UPH, UTP, UGL, UKET, UBIL, UNIT, UROB, ULEU, UEPI, UWBC, URBC, UBAC, CAST, UCOM, BILUA  MICROBIOLOGY: Recent Results (from the past 240 hour(s))  Surgical pcr screen     Status: None   Collection Time: 05/31/15  6:35 AM  Result Value Ref Range Status   MRSA, PCR NEGATIVE NEGATIVE Final   Staphylococcus aureus NEGATIVE NEGATIVE Final    Comment:        The Xpert SA Assay (FDA approved for NASAL specimens in patients over 34 years of age), is one component of a comprehensive surveillance program.  Test performance has been validated by [facility] for patients greater than or equal to 103 year old. It is not intended to diagnose infection nor to guide or monitor treatment.     RADIOLOGY STUDIES/RESULTS: Dg Chest 1 View  05/30/2015  CLINICAL DATA:  Fall EXAM: CHEST 1 VIEW COMPARISON:  April 14, 2015 FINDINGS: There is no  edema or consolidation. Heart is enlarged with pulmonary vascularity within normal limits. No pneumothorax. No adenopathy. No bone lesions. IMPRESSION: Cardiomegaly.  No edema or consolidation. Electronically Signed   By: Lowella Grip III M.D.   On: 05/30/2015 21:38   Dg Bone Density  05/09/2015  EXAM: DUAL X-RAY ABSORPTIOMETRY (DXA) FOR BONE MINERAL DENSITY IMPRESSION: Ordering Physician:  Dr. Redmond School, Your patient [facility] completed a BMD test on 05/09/2015 using the Readlyn (software version: 14.10) manufactured by [employer]. The following summarizes the results of our evaluation. PATIENT BIOGRAPHICAL: Name: AZA, KOPKA Patient ID: [id number] Birth Date: March 02, 1951 Height: 66.0 in. Gender: Female Exam Date: 05/09/2015 Weight: 130.0  lbs. Indications: Bilateral Oophrectomy, Low Calcium Intake, Post Menopausal, Renal Failure Fractures: Treatments: Vitamin D DENSITOMETRY RESULTS: Site          Region     Measured Date Measured Age WHO Classification Young Adult T-score BMD         %Change vs. Previous Significant Change (*) DualFemur Neck Right 05/09/2015 65.9 Osteoporosis -2.5 0.693 g/cm2 Right Forearm Radius 33% 05/09/2015 65.9 Normal -0.2 0.700 g/cm2 ASSESSMENT: BMD as determined from Femur Neck Right is 0.693 g/cm2 with a T-Score of -2.5. This patient is considered osteoporotic according to [facility]) criteria. (Patient does not meet criteria for FRAX assessment.) World Health Organization [facility]) criteria for post-menopausal, Caucasian Women: Normal:       T-score at or above -1 SD Osteopenia:   T-score between -1 and -2.5 SD Osteoporosis: T-score at or below -2.5 SD RECOMMENDATIONS: [place] recommends that FDA-approved medial therapies be considered in postmenopausal women and men age 4 or older with a: 1. Hip or vertebral (clinical or morphometric) fracture. 2. T-Score of < -2.5 at the spine or hip. 3. Ten-year  fracture probability by FRAX of 3% or greater for hip fracture or 20% or greater for major osteoporotic fracture. All treatment decisions require clinical judgment and consideration of individual patient factors, including patient preferences, co-morbidities, previous drug use, risk factors not captured in the FRAX model (e.g. falls, vitamin D deficiency, increased bone turnover, interval significant decline in bone density) and possible under-or over-estimation of fracture risk by FRAX. All patients should ensure an adequate intake of dietary calcium (1200 mg/d) and vitamin D (800 IU daily) unless contraindicated. FOLLOW-UP: People with diagnosed cases of osteoporosis or osteopenia should be regularly tested for bone mineral density. For patients eligible for Medicare, routine testing is allowed once every 2 years. Testing frequency can be increased for patients who have rapidly progressing disease, or for those who are receiving medical therapy to restore bone mass. I have reviewed this report, and agree with the above findings. [facility] Electronically Signed   By: Lowella Grip III M.D.   On: 05/09/2015 11:43   Dg C-arm 1-60 Min  05/31/2015  CLINICAL DATA:  Pinning of left femoral neck fracture. EXAM: DG C-ARM 61-120 MIN; LEFT FEMUR 2 VIEWS COMPARISON:  None. FINDINGS: Intraoperative images were obtained demonstrating placement of a total of 3 separate cannulated pins across the femoral neck which appear in good position. There is no evidence of significant displacement or angulation across the left femoral neck fracture. IMPRESSION: Near anatomic alignment with good positioning of 3 cannulated pins across the left femoral neck fracture. Electronically Signed   By: Aletta Edouard M.D.   On: 05/31/2015 09:39   Mm Screening Breast Tomo Bilateral  05/12/2015  CLINICAL DATA:  Screening. EXAM: DIGITAL SCREENING BILATERAL MAMMOGRAM WITH 3D TOMO WITH CAD COMPARISON:  Previous exam(s). ACR  Breast Density Category c: The breast tissue is heterogeneously dense, which may obscure small masses. FINDINGS: There are no findings suspicious for malignancy. Images were processed with CAD. IMPRESSION: No mammographic evidence of malignancy. A result letter of this screening mammogram will be mailed directly to the patient. RECOMMENDATION: Screening mammogram in one year. (Code:SM-B-01Y) BI-RADS CATEGORY  1: Negative. Electronically Signed   By: Fidela Salisbury M.D.   On: 05/12/2015 17:39   Dg Hip Unilat With Pelvis 2-3 Views Left  05/30/2015  CLINICAL DATA:  Pain following fall EXAM: DG HIP (WITH OR WITHOUT PELVIS) 2-3V LEFT COMPARISON:  None. FINDINGS:  Frontal pelvis as well as frontal and lateral left hip images were obtained. There is a fracture of the left femoral neck which extends to the subcapital region laterally with slight impaction along the lateral aspect of the subcapital femoral neck region on the left. No other fracture is apparent. No dislocation. There is mild symmetric narrowing of both hip joints. There are scattered foci of arterial vascular calcification bilaterally. IMPRESSION: Nondisplaced fracture of the left femoral neck with mild impaction at the subcapital femoral neck region with laterally on the left. No other fracture. No dislocation. There is mild symmetric narrowing of both hip joints. Electronically Signed   By: Lowella Grip III M.D.   On: 05/30/2015 20:44   Dg Femur Min 2 Views Left  05/31/2015  CLINICAL DATA:  Pinning of left femoral neck fracture. EXAM: DG C-ARM 61-120 MIN; LEFT FEMUR 2 VIEWS COMPARISON:  None. FINDINGS: Intraoperative images were obtained demonstrating placement of a total of 3 separate cannulated pins across the femoral neck which appear in good position. There is no evidence of significant displacement or angulation across the left femoral neck fracture. IMPRESSION: Near anatomic alignment with good positioning of 3 cannulated pins across  the left femoral neck fracture. Electronically Signed   By: Aletta Edouard M.D.   On: 05/31/2015 09:39    Oren Binet, MD  [facility] Pager:[phone]  If 7PM-7AM, please contact night-coverage [email or web address] Password [email or web address] 05/31/2015, 11:55 AM   LOS: 1 day

## 2015-05-31 NOTE — Progress Notes (Signed)
Patient transferred from [facility] to [phone] with left hip fracture and for discussion related to surgical repair w/Orthopaedics. Resting comfortable. No distress. VSS.

## 2015-05-31 NOTE — Consult Note (Addendum)
Sonya [name] is an 64 y.o. female referred by Dr Sloan Leiter   Chief Complaint: ESRD, Sec HPTH, anemia HPI: 64yo BF with ESRD on HD [facility] TTS admitted yest for Lt femoral neck fx for which she underwent ORIF this morning.  Last HD was Thursday.  Did well with surgery but still a little groggy.   Past Medical History  Diagnosis Date  . HTN (hypertension)   . Hypothyroidism   . Uterine cancer ([place]) 2012  . Anemia   . CKD (chronic kidney disease)   . Dysphagia   . Hyperparathyroidism due to renal insufficiency ([place])   . GERD (gastroesophageal reflux disease)   . Diverticulitis   . Vertical diplopia   . Vertigo     Past Surgical History  Procedure Laterality Date  . Cholecystectomy    . Cesarean section    . Laparoscopic total hysterectomy    . Colonoscopy  2000    TICS, IH  . Colonoscopy  2003 NUR BRBPR D50 V6    [place] TICS, IH  . Abdominal hysterectomy    . Nasal septum surgery    . Esophagogastroduodenoscopy (egd) with esophageal dilation N/A 06/08/2013    Dr. Fields:Stricture at the gastroesophageal junction/MILD NON-erosive gastritis (inflammation) was found in the gastric antrum; multiple biopsies/NO SOURCE FOR ANEMIA IDETIFIED-MOST LIKELY DUE TO ANEMIA OF CHRONIC DISEASE  . Colonoscopy N/A 09/04/2013    Procedure: COLONOSCOPY;  Surgeon: Danie Binder, MD;  [facility];  Service: Endoscopy;  Laterality: N/A;  10:30AM-moved to 9:25 [name]  . Appendectomy    . Bascilic vein transposition Right 09/17/2013    Procedure: BRACHIAL VEIN TRANSPOSITION;  Surgeon: Conrad Morton, MD;  Location: [place];  Service: Vascular;  Laterality: Right;  . Insertion of dialysis catheter Left 09/17/2013    Procedure: INSERTION OF DIALYSIS CATHETER;  Surgeon: Conrad Parks, MD;  Location: [place];  Service: Vascular;  Laterality: Left;  . Bascilic vein transposition Right 10/29/2013    Procedure: RIGHT SECOND STAGE BRACHIAL VEIN TRANSPOSITION;  Surgeon: Conrad Frontier, MD;   Location[facility];  Service: Vascular;  Laterality: Right;  . Givens capsule study N/A 12/03/2013    Procedure: GIVENS CAPSULE STUDY;  Surgeon: Danie Binder, MD;  [facility];  Service: Endoscopy;  Laterality: N/A;  7:30  . Esophagogastroduodenoscopy N/A 05/23/2015    Procedure: ESOPHAGOGASTRODUODENOSCOPY (EGD);  Surgeon: Danie Binder, MD;  [facility];  Service: Endoscopy;  Laterality: N/A;  1230  . Savory dilation N/A 05/23/2015    Procedure: SAVORY DILATION;  Surgeon: Danie Binder, MD;  [facility];  Service: Endoscopy;  Laterality: N/A;    Family History  Problem Relation Age of Onset  . Colon cancer Neg Hx   . Cancer Mother   . Diabetes Mother   . Hypertension Mother   . Diabetes Father   . Hypertension Father   . Hypertension Sister   FH neg renal ds  Social History:  reports that she has never smoked. She has never used smokeless tobacco. She reports that she does not drink alcohol or use illicit drugs. Married, lives with husband  Allergies:  Allergies  Allergen Reactions  . Cephalexin Itching    Medications Prior to Admission  Medication Sig Dispense Refill  . alendronate (FOSAMAX) 70 MG tablet Take 70 mg by mouth once a week.     Marland Kitchen allopurinol (ZYLOPRIM) 300 MG tablet Take 300 mg by mouth daily.     Marland Kitchen  cloNIDine (CATAPRES) 0.2 MG tablet Take 0.2 mg by mouth 3 (three) times daily.     Marland Kitchen docusate sodium (COLACE) 100 MG capsule Take 100 mg by mouth 2 (two) times daily.    . hydrALAZINE (APRESOLINE) 100 MG tablet Take 100 mg by mouth 3 (three) times daily.    Marland Kitchen levothyroxine (SYNTHROID, LEVOTHROID) 50 MCG tablet Take 1 tablet (50 mcg total) by mouth daily. 30 tablet 0  . lidocaine-prilocaine (EMLA) cream Apply 1 application topically as needed (on Dialysis days Tues,Thurs.,Sat.).    Marland Kitchen meclizine (ANTIVERT) 25 MG tablet Take 1 tablet by mouth 4 (four) times daily as needed. dizziness    . metoCLOPramide (REGLAN) 5 MG tablet 1 po before  breakfast and lunch and at bedtime. (Patient taking differently: Take 5 mg by mouth 4 (four) times daily -  before meals and at bedtime. ) 90 tablet 11  . NIFEdipine (PROCARDIA XL/ADALAT-CC) 90 MG 24 hr tablet Take 90 mg by mouth daily.    Marland Kitchen omeprazole (PRILOSEC) 20 MG capsule Take 1 capsule (20 mg total) by mouth daily. 31 capsule 5  . ondansetron (ZOFRAN) 4 MG tablet Take 4 mg by mouth every 8 (eight) hours as needed for nausea or vomiting.     . sevelamer carbonate (RENVELA) 800 MG tablet Take 800-2,400 mg by mouth 3 (three) times daily with meals. Patient takes 1 tablet with snack and 3 tablets with meal       Lab Results: UA: ND   Recent Labs  05/30/15 2145 05/31/15 0701  WBC 6.8  --   HGB 10.5* 13.6  HCT 34.6* 40.0  PLT 150  --    BMET  Recent Labs  05/30/15 2145 05/31/15 0701  NA 135 135  K 4.7 4.9  CL 102  --   CO2 23  --   GLUCOSE 96 79  BUN 32*  --   CREATININE 6.73*  --   CALCIUM 9.2  --    LFT No results for input(s): PROT, ALBUMIN, AST, ALT, ALKPHOS, BILITOT, BILIDIR, IBILI in the last 72 hours. Dg Chest 1 View  05/30/2015  CLINICAL DATA:  Fall EXAM: CHEST 1 VIEW COMPARISON:  April 14, 2015 FINDINGS: There is no edema or consolidation. Heart is enlarged with pulmonary vascularity within normal limits. No pneumothorax. No adenopathy. No bone lesions. IMPRESSION: Cardiomegaly.  No edema or consolidation. Electronically Signed   By: Lowella Grip III M.D.   On: 05/30/2015 21:38   Dg Hip Unilat With Pelvis 2-3 Views Left  05/30/2015  CLINICAL DATA:  Pain following fall EXAM: DG HIP (WITH OR WITHOUT PELVIS) 2-3V LEFT COMPARISON:  None. FINDINGS: Frontal pelvis as well as frontal and lateral left hip images were obtained. There is a fracture of the left femoral neck which extends to the subcapital region laterally with slight impaction along the lateral aspect of the subcapital femoral neck region on the left. No other fracture is apparent. No dislocation.  There is mild symmetric narrowing of both hip joints. There are scattered foci of arterial vascular calcification bilaterally. IMPRESSION: Nondisplaced fracture of the left femoral neck with mild impaction at the subcapital femoral neck region with laterally on the left. No other fracture. No dislocation. There is mild symmetric narrowing of both hip joints. Electronically Signed   By: Lowella Grip III M.D.   On: 05/30/2015 20:44    ROS: No change in vision No SOB  No CP No abd pain + Pain Lt hip No dysuria  PHYSICAL EXAM:  Blood pressure 210/85, pulse 73, temperature 97.8 F (36.6 C), temperature source Oral, resp. rate 15, height 5\' 6"  (1.676 m), weight 61.689 kg (136 lb), SpO2 100 %. HEENT: PERRLA EOMI  O2 by [place] NECK:No JVD LUNGS:Clear ant CARDIAC:RRR wo MRG ABD:+ BS NTND EXT:no edema  Bandage Lt hip  RUA AVF + bruit NEURO:CNI Ox3 No asterixis    [facility]  3hr 22min, 3K, EDW 63.5kg, TTS, BFR 400, EPO 1000 units, hectorol 2.75mcg  Assessment: 1. Lt femoral neck fx  SP ORIF 2. Sec HPTH 3. Anemia 4. HTN PLAN: 1. HD today 2. Resume hectorol 3. Resume ESA 4. Resume BP meds. 5. Give dose of clonidine now 6. Resume PO4 binder 7. Renal diet when allowed to eat.  Shamiracle Gorden T 05/31/2015, 9:40 AM

## 2015-05-31 NOTE — Progress Notes (Signed)
Pt admitted to 5N under telemetry level of care. Upon transfer from [facility], cardiac monitoring orders d/c'd. NP for [facility]. Rogue Bussing on call notified and he verified that no cardiac monitoring was required at this time. Nursing will continue to monitor.

## 2015-05-31 NOTE — ED Notes (Signed)
[facility] here to transport

## 2015-05-31 NOTE — Anesthesia Preprocedure Evaluation (Signed)
Anesthesia Evaluation  Patient identified by MRN, date of birth, ID band Patient awake    Reviewed: Allergy & Precautions, NPO status , Patient's Chart, lab work & pertinent test results  Airway Mallampati: I  TM Distance: >3 FB Neck ROM: Full    Dental   Pulmonary    Pulmonary exam normal        Cardiovascular hypertension, Pt. on medications Normal cardiovascular exam     Neuro/Psych    GI/Hepatic GERD  Medicated and Controlled,  Endo/Other  Hypothyroidism   Renal/GU ESRF and DialysisRenal disease     Musculoskeletal   Abdominal   Peds  Hematology   Anesthesia Other Findings   Reproductive/Obstetrics                             Anesthesia Physical Anesthesia Plan  ASA: III  Anesthesia Plan: General   Post-op Pain Management:    Induction: Intravenous  Airway Management Planned: Oral ETT  Additional Equipment:   Intra-op Plan:   Post-operative Plan: Extubation in OR  Informed Consent: I have reviewed the patients History and Physical, chart, labs and discussed the procedure including the risks, benefits and alternatives for the proposed anesthesia with the patient or authorized representative who has indicated his/her understanding and acceptance.     Plan Discussed with: CRNA and Surgeon  Anesthesia Plan Comments:         Anesthesia Quick Evaluation

## 2015-05-31 NOTE — Consult Note (Signed)
ORTHOPAEDIC CONSULTATION  REQUESTING PHYSICIAN: Jonetta Osgood, MD  Chief Complaint: Left hip fx  HPI: Sonya [name] is a 64 y.o. female who presents with left hip fx s/p mechanical fall.  Pain is severe in the left hip, that does not radiate, is severe when moved, sharp grinding in quality, better when immobilized.  Denies any presyncope, syncope, LOC.  Walks with walker at baseline.  Ortho consulted.  Past Medical History  Diagnosis Date  . HTN (hypertension)   . Hypothyroidism   . Uterine cancer ([place]) 2012  . Anemia   . CKD (chronic kidney disease)   . Dysphagia   . Hyperparathyroidism due to renal insufficiency ([place])   . GERD (gastroesophageal reflux disease)   . Diverticulitis   . Vertical diplopia   . Vertigo    Past Surgical History  Procedure Laterality Date  . Cholecystectomy    . Cesarean section    . Laparoscopic total hysterectomy    . Colonoscopy  2000    TICS, IH  . Colonoscopy  2003 NUR BRBPR D50 V6    DC/[place] TICS, IH  . Abdominal hysterectomy    . Nasal septum surgery    . Esophagogastroduodenoscopy (egd) with esophageal dilation N/A 06/08/2013    Dr. Fields:Stricture at the gastroesophageal junction/MILD NON-erosive gastritis (inflammation) was found in the gastric antrum; multiple biopsies/NO SOURCE FOR ANEMIA IDETIFIED-MOST LIKELY DUE TO ANEMIA OF CHRONIC DISEASE  . Colonoscopy N/A 09/04/2013    Procedure: COLONOSCOPY;  Surgeon: Danie Binder, MD;  [facility];  Service: Endoscopy;  Laterality: N/A;  10:30AM-moved to 9:25 [name]  . Appendectomy    . Bascilic vein transposition Right 09/17/2013    Procedure: BRACHIAL VEIN TRANSPOSITION;  Surgeon: Conrad Tuttle, MD;  Location: [facility];  Service: Vascular;  Laterality: Right;  . Insertion of dialysis catheter Left 09/17/2013    Procedure: INSERTION OF DIALYSIS CATHETER;  Surgeon: Conrad Lakeland, MD;  Location: [facility];  Service: Vascular;  Laterality: Left;  . Bascilic vein  transposition Right 10/29/2013    Procedure: RIGHT SECOND STAGE BRACHIAL VEIN TRANSPOSITION;  Surgeon: Conrad [place], MD;  Location: [facility];  Service: Vascular;  Laterality: Right;  . Givens capsule study N/A 12/03/2013    Procedure: GIVENS CAPSULE STUDY;  Surgeon: Danie Binder, MD;  [facility];  Service: Endoscopy;  Laterality: N/A;  7:30  . Esophagogastroduodenoscopy N/A 05/23/2015    Procedure: ESOPHAGOGASTRODUODENOSCOPY (EGD);  Surgeon: Danie Binder, MD;  [facility];  Service: Endoscopy;  Laterality: N/A;  1230  . Savory dilation N/A 05/23/2015    Procedure: SAVORY DILATION;  Surgeon: Danie Binder, MD;  [facility];  Service: Endoscopy;  Laterality: N/A;   Social History   Social History  . Marital Status: Married    Spouse Name: N/A  . Number of Children: N/A  . Years of Education: N/A   Social History Main Topics  . Smoking status: Never Smoker   . Smokeless tobacco: Never Used  . Alcohol Use: No  . Drug Use: No  . Sexual Activity: Not Asked   Other Topics Concern  . 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   Family History  Problem Relation Age of Onset  . Colon cancer Neg Hx   . Cancer Mother   . Diabetes Mother   . Hypertension Mother   . Diabetes Father   .  Hypertension Father   . Hypertension Sister    Allergies  Allergen Reactions  . Cephalexin Itching   Prior to Admission medications   Medication Sig Start Date End Date Taking? Authorizing Provider  alendronate (FOSAMAX) 70 MG tablet Take 70 mg by mouth once a week.  05/12/15  Yes Historical Provider, MD  allopurinol (ZYLOPRIM) 300 MG tablet Take 300 mg by mouth daily.  04/10/15  Yes Historical Provider, MD  cloNIDine (CATAPRES) 0.2 MG tablet Take 0.2 mg by mouth 3 (three) times daily.  04/11/15  Yes Historical Provider, MD  docusate sodium (COLACE) 100 MG capsule Take 100 mg by mouth 2 (two) times daily.   Yes Historical  Provider, MD  hydrALAZINE (APRESOLINE) 100 MG tablet Take 100 mg by mouth 3 (three) times daily. 04/09/15  Yes Historical Provider, MD  levothyroxine (SYNTHROID, LEVOTHROID) 50 MCG tablet Take 1 tablet (50 mcg total) by mouth daily. 04/17/15  Yes Samuella Cota, MD  lidocaine-prilocaine (EMLA) cream Apply 1 application topically as needed (on Dialysis days Tues,Thurs.,Sat.).   Yes Historical Provider, MD  meclizine (ANTIVERT) 25 MG tablet Take 1 tablet by mouth 4 (four) times daily as needed. dizziness 11/22/14  Yes Historical Provider, MD  metoCLOPramide (REGLAN) 5 MG tablet 1 po before breakfast and lunch and at bedtime. Patient taking differently: Take 5 mg by mouth 4 (four) times daily -  before meals and at bedtime.  05/13/15  Yes Danie Binder, MD  NIFEdipine (PROCARDIA XL/ADALAT-CC) 90 MG 24 hr tablet Take 90 mg by mouth daily. 04/10/15  Yes Historical Provider, MD  omeprazole (PRILOSEC) 20 MG capsule Take 1 capsule (20 mg total) by mouth daily. 07/29/14  Yes Orvil Feil, NP  ondansetron (ZOFRAN) 4 MG tablet Take 4 mg by mouth every 8 (eight) hours as needed for nausea or vomiting.  04/28/15  Yes Historical Provider, MD  sevelamer carbonate (RENVELA) 800 MG tablet Take 800-2,400 mg by mouth 3 (three) times daily with meals. Patient takes 1 tablet with snack and 3 tablets with meal   Yes Historical Provider, MD   Dg Chest 1 View  05/30/2015  CLINICAL DATA:  Fall EXAM: CHEST 1 VIEW COMPARISON:  April 14, 2015 FINDINGS: There is no edema or consolidation. Heart is enlarged with pulmonary vascularity within normal limits. No pneumothorax. No adenopathy. No bone lesions. IMPRESSION: Cardiomegaly.  No edema or consolidation. Electronically Signed   By: Lowella Grip III M.D.   On: 05/30/2015 21:38   Dg Hip Unilat With Pelvis 2-3 Views Left  05/30/2015  CLINICAL DATA:  Pain following fall EXAM: DG HIP (WITH OR WITHOUT PELVIS) 2-3V LEFT COMPARISON:  None. FINDINGS: Frontal pelvis as well as  frontal and lateral left hip images were obtained. There is a fracture of the left femoral neck which extends to the subcapital region laterally with slight impaction along the lateral aspect of the subcapital femoral neck region on the left. No other fracture is apparent. No dislocation. There is mild symmetric narrowing of both hip joints. There are scattered foci of arterial vascular calcification bilaterally. IMPRESSION: Nondisplaced fracture of the left femoral neck with mild impaction at the subcapital femoral neck region with laterally on the left. No other fracture. No dislocation. There is mild symmetric narrowing of both hip joints. Electronically Signed   By: Lowella Grip III M.D.   On: 05/30/2015 20:44    Positive ROS: All other systems have been reviewed and were otherwise negative with the exception of those mentioned in the  HPI and as above.  Physical Exam: General: Alert, no acute distress Cardiovascular: No pedal edema Respiratory: No cyanosis, no use of accessory musculature GI: No organomegaly, abdomen is soft and non-tender Skin: No lesions in the area of chief complaint Neurologic: Sensation intact distally Psychiatric: Patient is competent for consent with normal mood and affect Lymphatic: No axillary or cervical lymphadenopathy  MUSCULOSKELETAL:  - LLE NVI - compartments soft - pain with movement of left hip  Assessment: Left hip fx  Plan: - plan for cannulated screw fixation of valgus impacted femoral neck fx - 50 % WB postop x 6 weeks - consent obtained  Thank you for the consult and the opportunity to see Ms. Rader  N. Eduard Roux, MD [place] 7:25 AM

## 2015-05-31 NOTE — Progress Notes (Addendum)
ANTICOAGULATION CONSULT NOTE - Initial Consult  Pharmacy Consult for heparin or lovenox Indication: VTE Prophylaxis  Allergies  Allergen Reactions  . Cephalexin Itching    Patient Measurements: Height: 5\' 6"  (167.6 cm) Weight: 136 lb (61.689 kg) IBW/kg (Calculated) : 59.3  Vital Signs: Temp: 97.8 F (36.6 C) (10/29 1018) Temp Source: Oral (10/29 1018) BP: 211/85 mmHg (10/29 1018) Pulse Rate: 70 (10/29 1018)  Labs:  Recent Labs  05/30/15 2145 05/31/15 0701  HGB 10.5* 13.6  HCT 34.6* 40.0  PLT 150  --   LABPROT 15.6*  --   INR 1.23  --   CREATININE 6.73*  --     Estimated Creatinine Clearance: 7.7 mL/min (by C-G formula based on Cr of 6.73).   Medical History: Past Medical History  Diagnosis Date  . HTN (hypertension)   . Hypothyroidism   . Uterine cancer ([place]) 2012  . Anemia   . CKD (chronic kidney disease)   . Dysphagia   . Hyperparathyroidism due to renal insufficiency ([place])   . GERD (gastroesophageal reflux disease)   . Diverticulitis   . Vertical diplopia   . Vertigo    Assessment:  64 y/o F with fracture of femoral neck. Pt is ESRD on TTS dialysis. Pharmacy consulted to start VTE prophylaxis. Ortho would prefer lovenox will dose adjust to 30mg  daily and follow closely.  Goal of Therapy:  Monitor platelets by anticoagulation protocol: Yes   Plan:  Lovenox 30mg  sq daily Pharmacy will sign off. Please re-consult if any changes are needed.  Erin Hearing PharmD., BCPS Clinical Pharmacist Pager [phone] 05/31/2015 8:14 PM

## 2015-06-01 LAB — CBC
HCT: 36.5 % (ref 36.0–46.0)
Hemoglobin: 11.2 g/dL — ABNORMAL LOW (ref 12.0–15.0)
MCH: 23.4 pg — ABNORMAL LOW (ref 26.0–34.0)
MCHC: 30.7 g/dL (ref 30.0–36.0)
MCV: 76.4 fL — ABNORMAL LOW (ref 78.0–100.0)
PLATELETS: 156 10*3/uL (ref 150–400)
RBC: 4.78 MIL/uL (ref 3.87–5.11)
RDW: 22.3 % — AB (ref 11.5–15.5)
WBC: 5.4 10*3/uL (ref 4.0–10.5)

## 2015-06-01 LAB — RENAL FUNCTION PANEL
ALBUMIN: 2.8 g/dL — AB (ref 3.5–5.0)
Anion gap: 9 (ref 5–15)
BUN: 15 mg/dL (ref 6–20)
CO2: 29 mmol/L (ref 22–32)
CREATININE: 4.42 mg/dL — AB (ref 0.44–1.00)
Calcium: 9.1 mg/dL (ref 8.9–10.3)
Chloride: 97 mmol/L — ABNORMAL LOW (ref 101–111)
GFR calc Af Amer: 11 mL/min — ABNORMAL LOW (ref >60)
GFR, EST NON AFRICAN AMERICAN: 10 mL/min — AB (ref >60)
Glucose, Bld: 86 mg/dL (ref 65–99)
PHOSPHORUS: 4 mg/dL (ref 2.5–4.6)
Potassium: 4.1 mmol/L (ref 3.5–5.1)
Sodium: 135 mmol/L (ref 135–145)

## 2015-06-01 LAB — HEPATITIS B SURFACE ANTIGEN: Hepatitis B Surface Ag: NEGATIVE

## 2015-06-01 MED ORDER — POLYETHYLENE GLYCOL 3350 17 G PO PACK
17.0000 g | PACK | Freq: Two times a day (BID) | ORAL | Status: DC
  Administered 2015-06-01 – 2015-06-04 (×5): 17 g via ORAL
  Filled 2015-06-01 (×6): qty 1

## 2015-06-01 MED ORDER — MECLIZINE HCL 25 MG PO TABS
25.0000 mg | ORAL_TABLET | Freq: Three times a day (TID) | ORAL | Status: DC | PRN
  Administered 2015-06-01 (×2): 25 mg via ORAL
  Filled 2015-06-01 (×5): qty 1

## 2015-06-01 MED ORDER — RENA-VITE PO TABS
1.0000 | ORAL_TABLET | Freq: Every day | ORAL | Status: DC
  Administered 2015-06-01 – 2015-06-02 (×2): 1 via ORAL
  Filled 2015-06-01 (×3): qty 1

## 2015-06-01 MED ORDER — DIAZEPAM 2 MG PO TABS
2.0000 mg | ORAL_TABLET | Freq: Two times a day (BID) | ORAL | Status: DC | PRN
  Administered 2015-06-02 – 2015-06-04 (×3): 2 mg via ORAL
  Filled 2015-06-01 (×3): qty 1

## 2015-06-01 MED ORDER — CLONIDINE HCL 0.2 MG PO TABS
0.3000 mg | ORAL_TABLET | Freq: Three times a day (TID) | ORAL | Status: DC
  Administered 2015-06-01 – 2015-06-04 (×6): 0.3 mg via ORAL
  Filled 2015-06-01 (×14): qty 1

## 2015-06-01 NOTE — Evaluation (Signed)
Physical Therapy Evaluation Patient Details Name: Sonya [name] MRN: [id number] DOB: Sep 07, 1950 Today's Date: 06/01/2015   History of Present Illness  64 y.o. female s/p fall with left hip fracture and underwent L hip ORIF.  PMH significant for ESRD, HTN, and vertigo.  Clinical Impression  Patient is s/p above surgery resulting in functional limitations due to the deficits listed below (see PT Problem List). Patient will benefit from skilled PT to increase their independence and safety with mobility to allow discharge to home with husband providing 24 hour care and her daughter also able to assist.  Will likely need a WC for safety at DC.  Follow Up Recommendations Home health PT;Supervision for mobility/OOB    Equipment Recommendations  Wheelchair (measurements PT)    Recommendations for Other Services       Precautions / Restrictions Precautions Precautions: Fall Restrictions Weight Bearing Restrictions: Yes LLE Weight Bearing: Partial weight bearing LLE Partial Weight Bearing Percentage or Pounds: 50      Mobility  Bed Mobility Overal bed mobility: Needs Assistance Bed Mobility: Supine to Sit     Supine to sit: Modified independent (Device/Increase time);HOB elevated        Transfers Overall transfer level: Needs assistance Equipment used: Rolling walker (2 wheeled) Transfers: Sit to/from Stand Sit to Stand: Mod assist         General transfer comment: VCs for safe technique  Ambulation/Gait Ambulation/Gait assistance: Min assist;+2 safety/equipment (2nd person to follow with chair) Ambulation Distance (Feet): 10 Feet Assistive device: Rolling walker (2 wheeled) Gait Pattern/deviations: Step-to pattern;Decreased stride length Gait velocity: greatly less than functional norms   General Gait Details: gait very slow, but no balance losses. Verbal and tactile cues to not step to close to front of the walker  Stairs            Wheelchair Mobility     Modified Rankin (Stroke Patients Only)       Balance                                             Pertinent Vitals/Pain Pain Assessment: 0-10 Pain Score: 5  Pain Location: L hip Pain Intervention(s): Limited activity within patient's tolerance;Monitored during session;Premedicated before session    Home Living Family/patient expects to be discharged to:: Private residence Living Arrangements: Spouse/significant other;Children Available Help at Discharge: Family;Available 24 hours/day Type of Home: House Home Access: Stairs to enter Entrance Stairs-Rails: None Entrance Stairs-Number of Steps: 1 Home Layout: One level;Other (Comment) (with basement) Home Equipment: Walker - 4 wheels;Bedside commode;Cane - single point      Prior Function Level of Independence: Independent with assistive device(s) (uses cane or RW at times)         Comments: daughter typically drives her to HD     Hand Dominance   Dominant Hand: Right    Extremity/Trunk Assessment   Upper Extremity Assessment: Overall WFL for tasks assessed           Lower Extremity Assessment: LLE deficits/detail   LLE Deficits / Details: limited by pain and weakness  Cervical / Trunk Assessment: Kyphotic  Communication   Communication: No difficulties  Cognition Arousal/Alertness: Awake/alert Behavior During Therapy: WFL for tasks assessed/performed Overall Cognitive Status: Within Functional Limits for tasks assessed  General Comments General comments (skin integrity, edema, etc.): No family present. Pt motivated to return home and do what she can for herself    Exercises Total Joint Exercises Ankle Circles/Pumps: AROM;Left;10 reps;Supine      Assessment/Plan    PT Assessment Patient needs continued PT services  PT Diagnosis Difficulty walking   PT Problem List Decreased strength;Decreased range of motion;Decreased activity tolerance;Decreased  mobility;Decreased knowledge of use of DME;Pain  PT Treatment Interventions DME instruction;Gait training;Stair training;Functional mobility training;Therapeutic exercise;Patient/family education   PT Goals (Current goals can be found in the Care Plan section) Acute Rehab PT Goals Patient Stated Goal: to get her left leg stronger PT Goal Formulation: With patient Time For Goal Achievement: 06/07/15 Potential to Achieve Goals: Good    Frequency Min 5X/week   Barriers to discharge        Co-evaluation               End of Session Equipment Utilized During Treatment: Gait belt Activity Tolerance: Patient tolerated treatment well Patient left: in chair;with call bell/phone within reach           Time: 1004-1024 PT Time Calculation (min) (ACUTE ONLY): 20 min   Charges:   PT Evaluation $Initial PT Evaluation Tier I: 1 Procedure     PT G CodesMelvern Banker 06/01/2015, 10:59 AM Lavonia Dana, PT  [phone] 06/01/2015

## 2015-06-01 NOTE — Progress Notes (Addendum)
PATIENT DETAILS Name: Sonya [name] Age: 64 y.o. Sex: female Date of Birth: 1951/05/02 Admit Date: 05/30/2015 Admitting Physician Phillips Grout, MD [id number] J., MD  Brief summary:  64 year old female with history of hemodialysis, hypertension, history of intracranial hemorrhage, admitted with left hip pain following a mechanical fall. Further evaluation demonstrated a left femoral neck fracture. Admitted and underwent hip repair on 10/29.  Subjective: Dizzy this am-complains of pain at the operative site  Assessment/Plan: Principal Problem: Fracture of femoral neck, left, closed: Secondary to a mechanical fall. No syncopal episode. Pharmacy consulted and recommending Lovenox for VTE prophylaxis (history of ESRD/ICH). Weightbearing as tolerated, minimize narcotics, place on bowel regimen. Seen by physical therapy-recommendations are for home health-will consult case management and tentatively plan for discharge in morning-if pain and dizziness adequately controlled  Active Problems: ESRD: On hemodialysis-TTS-renal following  Hypertension:Uncontrolled-but better than the previous day-continue clonidine, labetalol, Procardia and hydralazine for a another 24 hours and then  adjust accordingly  Anemia in chronic kidney disease: On darbepoetin per nephrology. Hemoglobin currently close to usual baseline  Hypothyroidism: Continue levothyroxine  Gout: Continue with allopurinol  GERD: Continue PPI  ? History of gastroparesis: Recently seen by gastroenterology-recommendations are to start Reglan before meals-continue  History of intracranial hemorrhage (March 2016): Nonfocal exam  History of chronic intermittent vertigo: Continue as needed meclizine, add diazepam.  Disposition: Remain inpatient-suspect home 10/31.  Antimicrobial agents  See below  Anti-infectives    Start     Dose/Rate Route Frequency Ordered Stop   06/01/15 0200  ceFAZolin (ANCEF)  IVPB 2 g/50 mL premix     2 g 100 mL/hr over 30 Minutes Intravenous  Once 05/31/15 2003 06/01/15 0239   05/31/15 0815  ceFAZolin (ANCEF) IVPB 2 g/50 mL premix     2 g 100 mL/hr over 30 Minutes Intravenous  Once 05/31/15 0807 05/31/15 0837      DVT Prophylaxis: Prophylactic Lovenox -dose per pharmacy  Code Status: Full code   Family Communication None at bedside  Procedures: 10/29>>Open treatment of proximal end of femur, neck with internal fixation.  CONSULTS:  nephrology and orthopedic surgery  Time spent 30 minutes-Greater than 50% of this time was spent in counseling, explanation of diagnosis, planning of further management, and coordination of care.  MEDICATIONS: Scheduled Meds: . acetaminophen  1,000 mg Oral Q8H  . allopurinol  150 mg Oral Daily  . cloNIDine  0.2 mg Oral TID  . [START ON 06/07/2015] darbepoetin (ARANESP) injection - DIALYSIS  40 mcg Intravenous Q Sat-HD  . [START ON 06/02/2015] doxercalciferol  2.5 mcg Oral Once per day on Mon Wed Fri  . enoxaparin (LOVENOX) injection  30 mg Subcutaneous Q24H  . hydrALAZINE  100 mg Oral TID  . labetalol  300 mg Oral BID  . levothyroxine  50 mcg Oral QAC breakfast  . metoCLOPramide  5 mg Oral TID AC  . multivitamin  1 tablet Oral QHS  . NIFEdipine  90 mg Oral Daily  . pantoprazole  40 mg Oral Daily  . polyethylene glycol  17 g Oral BID  . sevelamer carbonate  2,400 mg Oral TID WC   Continuous Infusions: . sodium chloride Stopped (06/01/15 0244)   PRN Meds:.acetaminophen **OR** acetaminophen, albuterol, HYDROcodone-acetaminophen, menthol-cetylpyridinium **OR** phenol, methocarbamol **OR** methocarbamol (ROBAXIN)  IV, metoCLOPramide **OR** metoCLOPramide (REGLAN) injection, morphine injection, morphine injection, ondansetron **OR** ondansetron (ZOFRAN) IV, oxyCODONE    PHYSICAL EXAM: Vital signs in  last 24 hours: Filed Vitals:   06/01/15 0215 06/01/15 0216 06/01/15 0541 06/01/15 0952  BP: 153/65 153/65 152/53  173/68  Pulse:   64   Temp:   98.7 F (37.1 C)   TempSrc:   Oral   Resp:   17   Height:      Weight:      SpO2:   100%     Weight change: 1.311 kg (2 lb 14.2 oz) Filed Weights   05/30/15 1936 05/31/15 2120 06/01/15 0100  Weight: 61.689 kg (136 lb) 63 kg (138 lb 14.2 oz) 57.8 kg (127 lb 6.8 oz)   Body mass index is 20.58 kg/(m^2).   Gen Exam: Awake and alert with clear speech.   Neck: Supple, No JVD.  Chest: B/L Clear.   CVS: S1 S2 Regular, no murmurs.  Abdomen: soft, BS +, non tender, non distended.  Extremities: no edema, lower extremities warm to touch Neurologic: Non Focal.   Skin: No Rash.   Wounds: N/A.    Intake/Output from previous day:  Intake/Output Summary (Last 24 hours) at 06/01/15 1315 Last data filed at 06/01/15 0900  Gross per 24 hour  Intake    240 ml  Output   2994 ml  Net  -2754 ml     LAB RESULTS: CBC  Recent Labs Lab 05/30/15 2145 05/31/15 0701 06/01/15 0725  WBC 6.8  --  5.4  HGB 10.5* 13.6 11.2*  HCT 34.6* 40.0 36.5  PLT 150  --  156  MCV 76.9*  --  76.4*  MCH 23.3*  --  23.4*  MCHC 30.3  --  30.7  RDW 22.2*  --  22.3*  LYMPHSABS 1.1  --   --   MONOABS 0.6  --   --   EOSABS 0.1  --   --   BASOSABS 0.0  --   --     Chemistries   Recent Labs Lab 05/30/15 2145 05/31/15 0701 06/01/15 0725  NA 135 135 135  K 4.7 4.9 4.1  CL 102  --  97*  CO2 23  --  29  GLUCOSE 96 79 86  BUN 32*  --  15  CREATININE 6.73*  --  4.42*  CALCIUM 9.2  --  9.1    CBG: No results for input(s): GLUCAP in the last 168 hours.  GFR Estimated Creatinine Clearance: 11.4 mL/min (by C-G formula based on Cr of 4.42).  Coagulation profile  Recent Labs Lab 05/30/15 2145  INR 1.23    Cardiac Enzymes No results for input(s): CKMB, TROPONINI, MYOGLOBIN in the last 168 hours.  Invalid input(s): CK  Invalid input(s): POCBNP No results for input(s): DDIMER in the last 72 hours. No results for input(s): HGBA1C in the last 72 hours. No results  for input(s): CHOL, HDL, LDLCALC, TRIG, CHOLHDL, LDLDIRECT in the last 72 hours. No results for input(s): TSH, T4TOTAL, T3FREE, THYROIDAB in the last 72 hours.  Invalid input(s): FREET3 No results for input(s): VITAMINB12, FOLATE, FERRITIN, TIBC, IRON, RETICCTPCT in the last 72 hours. No results for input(s): LIPASE, AMYLASE in the last 72 hours.  Urine Studies No results for input(s): UHGB, CRYS in the last 72 hours.  Invalid input(s): UACOL, UAPR, USPG, UPH, UTP, UGL, UKET, UBIL, UNIT, UROB, ULEU, UEPI, UWBC, URBC, UBAC, CAST, UCOM, BILUA  MICROBIOLOGY: Recent Results (from the past 240 hour(s))  Surgical pcr screen     Status: None   Collection Time: 05/31/15  6:35 AM  Result Value Ref Range Status  MRSA, PCR NEGATIVE NEGATIVE Final   Staphylococcus aureus NEGATIVE NEGATIVE Final    Comment:        The Xpert SA Assay (FDA approved for NASAL specimens in patients over 66 years of age), is one component of a comprehensive surveillance program.  Test performance has been validated by [facility] for patients greater than or equal to 30 year old. It is not intended to diagnose infection nor to guide or monitor treatment.     RADIOLOGY STUDIES/RESULTS: Dg Chest 1 View  05/30/2015  CLINICAL DATA:  Fall EXAM: CHEST 1 VIEW COMPARISON:  April 14, 2015 FINDINGS: There is no edema or consolidation. Heart is enlarged with pulmonary vascularity within normal limits. No pneumothorax. No adenopathy. No bone lesions. IMPRESSION: Cardiomegaly.  No edema or consolidation. Electronically Signed   By: Lowella Grip III M.D.   On: 05/30/2015 21:38   Dg Bone Density  05/09/2015  EXAM: DUAL X-RAY ABSORPTIOMETRY (DXA) FOR BONE MINERAL DENSITY IMPRESSION: Ordering Physician:  Dr. Redmond School, Your patient [facility] completed a BMD test on 05/09/2015 using the [employer] (software version: 14.10) manufactured by [employer]. The following summarizes the  results of our evaluation. PATIENT BIOGRAPHICAL: Name: DEBORAN, MCKINNEY Patient ID: [id number] Birth Date: June 12, 1951 Height: 66.0 in. Gender: Female Exam Date: 05/09/2015 Weight: 130.0 lbs. Indications: Bilateral Oophrectomy, Low Calcium Intake, Post Menopausal, Renal Failure Fractures: Treatments: Vitamin D DENSITOMETRY RESULTS: Site          Region     Measured Date Measured Age WHO Classification Young Adult T-score BMD         %Change vs. Previous Significant Change (*) DualFemur Neck Right 05/09/2015 65.9 Osteoporosis -2.5 0.693 g/cm2 Right Forearm Radius 33% 05/09/2015 65.9 Normal -0.2 0.700 g/cm2 ASSESSMENT: BMD as determined from Femur Neck Right is 0.693 g/cm2 with a T-Score of -2.5. This patient is considered osteoporotic according to [employer] [facility]) criteria. (Patient does not meet criteria for FRAX assessment.) World Health Organization [facility]) criteria for post-menopausal, Caucasian Women: Normal:       T-score at or above -1 SD Osteopenia:   T-score between -1 and -2.5 SD Osteoporosis: T-score at or below -2.5 SD RECOMMENDATIONS: [place] recommends that FDA-approved medial therapies be considered in postmenopausal women and men age 40 or older with a: 1. Hip or vertebral (clinical or morphometric) fracture. 2. T-Score of < -2.5 at the spine or hip. 3. Ten-year fracture probability by FRAX of 3% or greater for hip fracture or 20% or greater for major osteoporotic fracture. All treatment decisions require clinical judgment and consideration of individual patient factors, including patient preferences, co-morbidities, previous drug use, risk factors not captured in the FRAX model (e.g. falls, vitamin D deficiency, increased bone turnover, interval significant decline in bone density) and possible under-or over-estimation of fracture risk by FRAX. All patients should ensure an adequate intake of dietary calcium (1200 mg/d) and vitamin D (800 IU daily) unless  contraindicated. FOLLOW-UP: People with diagnosed cases of osteoporosis or osteopenia should be regularly tested for bone mineral density. For patients eligible for Medicare, routine testing is allowed once every 2 years. Testing frequency can be increased for patients who have rapidly progressing disease, or for those who are receiving medical therapy to restore bone mass. I have reviewed this report, and agree with the above findings. [facility] Electronically Signed   By: Lowella Grip III M.D.   On: 05/09/2015 11:43   Dg C-arm 1-60 Min  05/31/2015  CLINICAL DATA:  Pinning of left femoral neck fracture. EXAM: DG C-ARM 61-120 MIN; LEFT FEMUR 2 VIEWS COMPARISON:  None. FINDINGS: Intraoperative images were obtained demonstrating placement of a total of 3 separate cannulated pins across the femoral neck which appear in good position. There is no evidence of significant displacement or angulation across the left femoral neck fracture. IMPRESSION: Near anatomic alignment with good positioning of 3 cannulated pins across the left femoral neck fracture. Electronically Signed   By: Aletta Edouard M.D.   On: 05/31/2015 09:39   Mm Screening Breast Tomo Bilateral  05/12/2015  CLINICAL DATA:  Screening. EXAM: DIGITAL SCREENING BILATERAL MAMMOGRAM WITH 3D TOMO WITH CAD COMPARISON:  Previous exam(s). ACR Breast Density Category c: The breast tissue is heterogeneously dense, which may obscure small masses. FINDINGS: There are no findings suspicious for malignancy. Images were processed with CAD. IMPRESSION: No mammographic evidence of malignancy. A result letter of this screening mammogram will be mailed directly to the patient. RECOMMENDATION: Screening mammogram in one year. (Code:SM-B-01Y) BI-RADS CATEGORY  1: Negative. Electronically Signed   By: Fidela Salisbury M.D.   On: 05/12/2015 17:39   Dg Hip Unilat With Pelvis 2-3 Views Left  05/30/2015  CLINICAL DATA:  Pain following fall EXAM: DG  HIP (WITH OR WITHOUT PELVIS) 2-3V LEFT COMPARISON:  None. FINDINGS: Frontal pelvis as well as frontal and lateral left hip images were obtained. There is a fracture of the left femoral neck which extends to the subcapital region laterally with slight impaction along the lateral aspect of the subcapital femoral neck region on the left. No other fracture is apparent. No dislocation. There is mild symmetric narrowing of both hip joints. There are scattered foci of arterial vascular calcification bilaterally. IMPRESSION: Nondisplaced fracture of the left femoral neck with mild impaction at the subcapital femoral neck region with laterally on the left. No other fracture. No dislocation. There is mild symmetric narrowing of both hip joints. Electronically Signed   By: Lowella Grip III M.D.   On: 05/30/2015 20:44   Dg Femur Min 2 Views Left  05/31/2015  CLINICAL DATA:  Pinning of left femoral neck fracture. EXAM: DG C-ARM 61-120 MIN; LEFT FEMUR 2 VIEWS COMPARISON:  None. FINDINGS: Intraoperative images were obtained demonstrating placement of a total of 3 separate cannulated pins across the femoral neck which appear in good position. There is no evidence of significant displacement or angulation across the left femoral neck fracture. IMPRESSION: Near anatomic alignment with good positioning of 3 cannulated pins across the left femoral neck fracture. Electronically Signed   By: Aletta Edouard M.D.   On: 05/31/2015 09:39    Oren Binet, MD  [facility] Pager:[phone]  If 7PM-7AM, please contact night-coverage [email or web address] Password [email or web address] 06/01/2015, 1:15 PM   LOS: 2 days

## 2015-06-01 NOTE — Progress Notes (Signed)
S: Pain controlled.  Dialysis went well O:BP 152/53 mmHg  Pulse 64  Temp(Src) 98.7 F (37.1 C) (Oral)  Resp 17  Ht 5\' 6"  (1.676 m)  Wt 57.8 kg (127 lb 6.8 oz)  BMI 20.58 kg/m2  SpO2 100%  Intake/Output Summary (Last 24 hours) at 06/01/15 0933 Last data filed at 06/01/15 0100  Gross per 24 hour  Intake    120 ml  Output   2994 ml  Net  -2874 ml   Weight change: 1.311 kg (2 lb 14.2 oz) [id number] and alert CVS:RRR Resp:clear Abd:+ BS NTND Ext:no edema NEURO:CNI Ox3 No asterixis   . acetaminophen  1,000 mg Oral Q8H  . allopurinol  150 mg Oral Daily  . cloNIDine  0.2 mg Oral TID  . [START ON 06/07/2015] darbepoetin (ARANESP) injection - DIALYSIS  40 mcg Intravenous Q Sat-HD  . [START ON 06/02/2015] doxercalciferol  2.5 mcg Oral Once per day on Mon Wed Fri  . enoxaparin (LOVENOX) injection  30 mg Subcutaneous Q24H  . hydrALAZINE  100 mg Oral TID  . labetalol  300 mg Oral BID  . levothyroxine  50 mcg Oral QAC breakfast  . metoCLOPramide  5 mg Oral TID AC  . NIFEdipine  90 mg Oral Daily  . pantoprazole  40 mg Oral Daily  . polyethylene glycol  17 g Oral BID  . sevelamer carbonate  2,400 mg Oral TID WC   Dg Chest 1 View  05/30/2015  CLINICAL DATA:  Fall EXAM: CHEST 1 VIEW COMPARISON:  April 14, 2015 FINDINGS: There is no edema or consolidation. Heart is enlarged with pulmonary vascularity within normal limits. No pneumothorax. No adenopathy. No bone lesions. IMPRESSION: Cardiomegaly.  No edema or consolidation. Electronically Signed   By: Lowella Grip III M.D.   On: 05/30/2015 21:38   Dg C-arm 1-60 Min  05/31/2015  CLINICAL DATA:  Pinning of left femoral neck fracture. EXAM: DG C-ARM 61-120 MIN; LEFT FEMUR 2 VIEWS COMPARISON:  None. FINDINGS: Intraoperative images were obtained demonstrating placement of a total of 3 separate cannulated pins across the femoral neck which appear in good position. There is no evidence of significant displacement or angulation across the  left femoral neck fracture. IMPRESSION: Near anatomic alignment with good positioning of 3 cannulated pins across the left femoral neck fracture. Electronically Signed   By: Aletta Edouard M.D.   On: 05/31/2015 09:39   Dg Hip Unilat With Pelvis 2-3 Views Left  05/30/2015  CLINICAL DATA:  Pain following fall EXAM: DG HIP (WITH OR WITHOUT PELVIS) 2-3V LEFT COMPARISON:  None. FINDINGS: Frontal pelvis as well as frontal and lateral left hip images were obtained. There is a fracture of the left femoral neck which extends to the subcapital region laterally with slight impaction along the lateral aspect of the subcapital femoral neck region on the left. No other fracture is apparent. No dislocation. There is mild symmetric narrowing of both hip joints. There are scattered foci of arterial vascular calcification bilaterally. IMPRESSION: Nondisplaced fracture of the left femoral neck with mild impaction at the subcapital femoral neck region with laterally on the left. No other fracture. No dislocation. There is mild symmetric narrowing of both hip joints. Electronically Signed   By: Lowella Grip III M.D.   On: 05/30/2015 20:44   Dg Femur Min 2 Views Left  05/31/2015  CLINICAL DATA:  Pinning of left femoral neck fracture. EXAM: DG C-ARM 61-120 MIN; LEFT FEMUR 2 VIEWS COMPARISON:  None. FINDINGS: Intraoperative images were  obtained demonstrating placement of a total of 3 separate cannulated pins across the femoral neck which appear in good position. There is no evidence of significant displacement or angulation across the left femoral neck fracture. IMPRESSION: Near anatomic alignment with good positioning of 3 cannulated pins across the left femoral neck fracture. Electronically Signed   By: Aletta Edouard M.D.   On: 05/31/2015 09:39   BMET    Component Value Date/Time   NA 135 06/01/2015 0725   K 4.1 06/01/2015 0725   CL 97* 06/01/2015 0725   CO2 29 06/01/2015 0725   GLUCOSE 86 06/01/2015 0725   BUN  15 06/01/2015 0725   CREATININE 4.42* 06/01/2015 0725   CREATININE 4.40* 06/07/2013 1102   CALCIUM 9.1 06/01/2015 0725   CALCIUM 8.6 06/09/2013 1052   GFRNONAA 10* 06/01/2015 0725   GFRAA 11* 06/01/2015 0725   CBC    Component Value Date/Time   WBC 5.4 06/01/2015 0725   RBC 4.78 06/01/2015 0725   RBC 3.26* 06/20/2013 1518   HGB 11.2* 06/01/2015 0725   HCT 36.5 06/01/2015 0725   PLT PENDING 06/01/2015 0725   MCV 76.4* 06/01/2015 0725   MCH 23.4* 06/01/2015 0725   MCHC 30.7 06/01/2015 0725   RDW 22.3* 06/01/2015 0725   LYMPHSABS 1.1 05/30/2015 2145   MONOABS 0.6 05/30/2015 2145   EOSABS 0.1 05/30/2015 2145   BASOSABS 0.0 05/30/2015 2145     Assessment: 1. Lt femoral neck Fx SP ORIF 2. Sec HPTH 3. Anemia 4. HTN   Plan: 1. Plan HD tuesday  Jahne Krukowski T

## 2015-06-01 NOTE — Progress Notes (Signed)
   Subjective:  Patient reports pain as mild.    Objective:   VITALS:   Filed Vitals:   06/01/15 [id number] 06/01/15 0215 06/01/15 0216 06/01/15 0541  BP: 153/65 153/65 153/65 152/53  Pulse: 69   64  Temp: 98.8 F (37.1 C)   98.7 F (37.1 C)  TempSrc: Oral   Oral  Resp: 20   17  Height:      Weight:      SpO2: 100%   100%    Neurologically intact Neurovascular intact Sensation intact distally Intact pulses distally Dorsiflexion/Plantar flexion intact Incision: dressing C/D/I and no drainage No cellulitis present Compartment soft   Lab Results  Component Value Date   WBC 5.4 06/01/2015   HGB 11.2* 06/01/2015   HCT 36.5 06/01/2015   MCV 76.4* 06/01/2015   PLT PENDING 06/01/2015     Assessment/Plan:  1 Day Post-Op   - Expected postop acute blood loss anemia - will monitor for symptoms - Up with PT/OT - DVT ppx - SCDs, ambulation, lovenox - 50% PWB operative extremity - patient already sitting on side of bed - anticipate she will progress quickly with PT - stable from ortho stand point  Marianna Payment 06/01/2015, 8:39 AM [phone]

## 2015-06-02 ENCOUNTER — Encounter (HOSPITAL_COMMUNITY): Payer: Self-pay | Admitting: Orthopaedic Surgery

## 2015-06-02 DIAGNOSIS — Z992 Dependence on renal dialysis: Secondary | ICD-10-CM | POA: Diagnosis not present

## 2015-06-02 DIAGNOSIS — N186 End stage renal disease: Secondary | ICD-10-CM | POA: Diagnosis not present

## 2015-06-02 DIAGNOSIS — Z419 Encounter for procedure for purposes other than remedying health state, unspecified: Secondary | ICD-10-CM

## 2015-06-02 DIAGNOSIS — S72002D Fracture of unspecified part of neck of left femur, subsequent encounter for closed fracture with routine healing: Secondary | ICD-10-CM

## 2015-06-02 LAB — CBC
HEMATOCRIT: 34.1 % — AB (ref 36.0–46.0)
Hemoglobin: 10.8 g/dL — ABNORMAL LOW (ref 12.0–15.0)
MCH: 23.7 pg — ABNORMAL LOW (ref 26.0–34.0)
MCHC: 31.7 g/dL (ref 30.0–36.0)
MCV: 74.8 fL — ABNORMAL LOW (ref 78.0–100.0)
PLATELETS: 168 10*3/uL (ref 150–400)
RBC: 4.56 MIL/uL (ref 3.87–5.11)
RDW: 21.8 % — AB (ref 11.5–15.5)
WBC: 6.2 10*3/uL (ref 4.0–10.5)

## 2015-06-02 LAB — BASIC METABOLIC PANEL
ANION GAP: 13 (ref 5–15)
BUN: 29 mg/dL — ABNORMAL HIGH (ref 6–20)
CALCIUM: 9 mg/dL (ref 8.9–10.3)
CO2: 25 mmol/L (ref 22–32)
Chloride: 95 mmol/L — ABNORMAL LOW (ref 101–111)
Creatinine, Ser: 6.06 mg/dL — ABNORMAL HIGH (ref 0.44–1.00)
GFR, EST AFRICAN AMERICAN: 8 mL/min — AB (ref >60)
GFR, EST NON AFRICAN AMERICAN: 7 mL/min — AB (ref >60)
Glucose, Bld: 78 mg/dL (ref 65–99)
Potassium: 4.7 mmol/L (ref 3.5–5.1)
Sodium: 133 mmol/L — ABNORMAL LOW (ref 135–145)

## 2015-06-02 LAB — GLUCOSE, CAPILLARY: GLUCOSE-CAPILLARY: 80 mg/dL (ref 65–99)

## 2015-06-02 MED ORDER — CLONIDINE HCL 0.3 MG PO TABS: 0.3000 mg | ORAL_TABLET | Freq: Three times a day (TID) | ORAL | Status: DC

## 2015-06-02 MED ORDER — CLONIDINE HCL 0.2 MG PO TABS
0.2000 mg | ORAL_TABLET | Freq: Once | ORAL | Status: AC
  Administered 2015-06-02: 0.2 mg via ORAL
  Filled 2015-06-02: qty 1

## 2015-06-02 NOTE — Care Management Important Message (Signed)
Important Message  Patient Details  Name: Sonya [name] MRN: [id number] Date of Birth: Feb 18, 1951   Medicare Important Message Given:  Yes-second notification given    Nathen May 06/02/2015, 5:42 PM

## 2015-06-02 NOTE — Care Management Note (Signed)
Case Management Note  Patient Details  Name: Sonya [name] MRN: [id number] Date of Birth: January 04, 1951  Subjective/Objective:  64 yr old female s/p  Left hip ORIF.                    Action/Plan:  Case manager spoke with patient at the bedside concerning discharge needs. Patient is going to need shortterm rehab at [facility]. She states her first choice is [facility] and second is [facility]. Patient will need facility that can facilitate transportation for her to dialysis. Her dates are Tues/Thurs/Sat. At [facility] on [address] in [place]. case manager notified Denyse Amass, Education officer, museum.   Expected Discharge Date:   06/03/15               Expected Discharge Plan:  Skilled Nursing Facility  In-House Referral:  Clinical Social Work  Discharge planning Services  CM Consult  Post Acute Care Choice:  Home Health Choice offered to:  Patient  DME Arranged:    DME Agency:     HH Arranged:    [place] Agency:     Status of Service:  In process, will continue to follow  Medicare Important Message Given:    Date Medicare IM Given:    Medicare IM give by:    Date Additional Medicare IM Given:    Additional Medicare Important Message give by:     If discussed at [place] of Stay Meetings, dates discussed:    Additional Comments:  Ninfa Meeker, RN 06/02/2015, 3:33 PM

## 2015-06-02 NOTE — Progress Notes (Signed)
S: No new events, likely dc home today with HH  O:BP 131/52 mmHg  Pulse 59  Temp(Src) 98.3 F (36.8 C) (Oral)  Resp 16  Ht 5\' 6"  (1.676 m)  Wt 57.8 kg (127 lb 6.8 oz)  BMI 20.58 kg/m2  SpO2 100%  Intake/Output Summary (Last 24 hours) at 06/02/15 1111 Last data filed at 06/01/15 1500  Gross per 24 hour  Intake    120 ml  Output      0 ml  Net    120 ml   Weight change:  [id number] and alert CVS:RRR Resp:clear Abd:+ BS NTND Ext:no edema NEURO:CNI Ox3 No asterixis RUE AVF +B/T   . acetaminophen  1,000 mg Oral Q8H  . allopurinol  150 mg Oral Daily  . cloNIDine  0.3 mg Oral TID  . [START ON 06/07/2015] darbepoetin (ARANESP) injection - DIALYSIS  40 mcg Intravenous Q Sat-HD  . doxercalciferol  2.5 mcg Oral Once per day on Mon Wed Fri  . enoxaparin (LOVENOX) injection  30 mg Subcutaneous Q24H  . hydrALAZINE  100 mg Oral TID  . labetalol  300 mg Oral BID  . levothyroxine  50 mcg Oral QAC breakfast  . metoCLOPramide  5 mg Oral TID AC  . multivitamin  1 tablet Oral QHS  . NIFEdipine  90 mg Oral Daily  . pantoprazole  40 mg Oral Daily  . polyethylene glycol  17 g Oral BID  . sevelamer carbonate  2,400 mg Oral TID WC   No results found. BMET    Component Value Date/Time   NA 133* 06/02/2015 0335   K 4.7 06/02/2015 0335   CL 95* 06/02/2015 0335   CO2 25 06/02/2015 0335   GLUCOSE 78 06/02/2015 0335   BUN 29* 06/02/2015 0335   CREATININE 6.06* 06/02/2015 0335   CREATININE 4.40* 06/07/2013 1102   CALCIUM 9.0 06/02/2015 0335   CALCIUM 8.6 06/09/2013 1052   GFRNONAA 7* 06/02/2015 0335   GFRAA 8* 06/02/2015 0335   CBC    Component Value Date/Time   WBC 6.2 06/02/2015 0335   RBC 4.56 06/02/2015 0335   RBC 3.26* 06/20/2013 1518   HGB 10.8* 06/02/2015 0335   HCT 34.1* 06/02/2015 0335   PLT 168 06/02/2015 0335   MCV 74.8* 06/02/2015 0335   MCH 23.7* 06/02/2015 0335   MCHC 31.7 06/02/2015 0335   RDW 21.8* 06/02/2015 0335   LYMPHSABS 1.1 05/30/2015 2145   MONOABS 0.6  05/30/2015 2145   EOSABS 0.1 05/30/2015 2145   BASOSABS 0.0 05/30/2015 2145     Assessment: 1. Lt femoral neck Fx SP ORIF 2. Sec HPTH 3. Anemia 4. HTN 5. ESRD  Plan: 1. Plan HD Tuesday if here  Shaconda Hajduk, Thurmond Butts B

## 2015-06-02 NOTE — Discharge Summary (Addendum)
Sonya [name], is a 64 y.o. female  DOB January 30, 1951  MRN [id number].  Admission date:  05/30/2015  Admitting Physician  Phillips Grout, MD  Discharge Date:  06/04/2015   Primary MD  [name], MD  Recommendations for primary care physician for things to follow:   Check CBC, BMP, next visit   Admission Diagnosis  Closed fracture of neck of left femur, initial encounter [facility]) [S72.002A]   Discharge Diagnosis  Closed fracture of neck of left femur, initial encounter [name] [S72.002A]     Principal Problem:   Fracture of femoral neck, left, closed Active Problems:   Essential hypertension   Anemia in chronic kidney disease   End stage renal disease [name]   Elective surgery      Past Medical History  Diagnosis Date  . HTN (hypertension)   . Hypothyroidism   . Uterine cancer [name] 2012  . Anemia   . CKD (chronic kidney disease)   . Dysphagia   . Hyperparathyroidism due to renal insufficiency [name]   . GERD (gastroesophageal reflux disease)   . Diverticulitis   . Vertical diplopia   . Vertigo     Past Surgical History  Procedure Laterality Date  . Cholecystectomy    . Cesarean section    . Laparoscopic total hysterectomy    . Colonoscopy  2000    TICS, IH  . Colonoscopy  2003 NUR BRBPR D50 V6    [name], IH  . Abdominal hysterectomy    . Nasal septum surgery    . Esophagogastroduodenoscopy (egd) with esophageal dilation N/A 06/08/2013    Dr. Fields:Stricture at the gastroesophageal junction/MILD NON-erosive gastritis (inflammation) was found in the gastric antrum; multiple biopsies/NO SOURCE FOR ANEMIA IDETIFIED-MOST LIKELY DUE TO ANEMIA OF CHRONIC DISEASE  . Colonoscopy N/A 09/04/2013    Procedure: COLONOSCOPY;  Surgeon: Danie Binder, MD;  [facility];  Service: Endoscopy;   Laterality: N/A;  10:30AM-moved to 9:25 [name]  . Appendectomy    . Bascilic vein transposition Right 09/17/2013    Procedure: BRACHIAL VEIN TRANSPOSITION;  Surgeon: Conrad Glenwood, MD;  Location: [place];  Service: Vascular;  Laterality: Right;  . Insertion of dialysis catheter Left 09/17/2013    Procedure: INSERTION OF DIALYSIS CATHETER;  Surgeon: Conrad [place], MD;  Location: [place];  Service: Vascular;  Laterality: Left;  . Bascilic vein transposition Right 10/29/2013    Procedure: RIGHT SECOND STAGE BRACHIAL VEIN TRANSPOSITION;  Surgeon: Conrad , MD;  Location: [facility] OR;  Service: Vascular;  Laterality: Right;  . Givens capsule study N/A 12/03/2013    Procedure: GIVENS CAPSULE STUDY;  Surgeon: Danie Binder, MD;  [facility];  Service: Endoscopy;  Laterality: N/A;  7:30  . Esophagogastroduodenoscopy N/A 05/23/2015    Procedure: ESOPHAGOGASTRODUODENOSCOPY (EGD);  Surgeon: Danie Binder, MD;  [facility];  Service: Endoscopy;  Laterality: N/A;  1230  . [place] dilation N/A 05/23/2015    Procedure: SAVORY DILATION;  Surgeon: Carlyon Prows  Rexene Edison, MD;  [facility];  Service: Endoscopy;  Laterality: N/A;  . Hip pinning,cannulated Left 05/31/2015    Procedure: CANNULATED HIP PINNING;  Surgeon: Leandrew Koyanagi, MD;  Location: [place];  Service: Orthopedics;  Laterality: Left;       HPI  from the history and physical done on the day of admission:    64 year old female with history of hemodialysis, hypertension, history of intracranial hemorrhage, admitted with left hip pain following a mechanical fall. Further evaluation demonstrated a left femoral neck fracture. Admitted and underwent hip repair on 10/29.     Hospital Course:   Fracture of femoral neck, left, closed: Secondary to a mechanical fall. No syncopal episode. Pharmacy consulted and recommending Lovenox for VTE prophylaxis (history of ESRD/ICH). Weightbearing partial on left leg per orthopedics,  seems to have tolerated the procedure well, minimal perioperative blood loss with anemia not requiring transfusion. Will be discharged to SNF.  ESRD: On hemodialysis-TTS-renal followed the patient while she was here.  Hypertension: Better controlled with increased dose of clonidine and hydralazine, continue home dose labetalol and Procardia , continue to monitor blood pressure by PCP and adjust as needed.   Anemia in chronic kidney disease: On darbepoetin per nephrology. Follow H&H outpatient post discharge.  Hypothyroidism: Continue levothyroxine  Gout: Continue with allopurinol  GERD: Continue PPI  ? History of gastroparesis: Recently seen by gastroenterology-recommendations are to start Reglan before meals-continue  History of intracranial hemorrhage (March 2016): Nonfocal exam, review charts looks like around 6-8 months ago she had an controlled blood pressure and small vessel disease related minor intracranial hemorrhage, discussed her case with neurosurgeon Dr. Newman Pies she is stable for prophylactic dose Lovenox post hip repair surgery. At this time benefits clearly outweigh the risks. This was discussed with the patient and she accepts it.  History of chronic intermittent vertigo: Continue as needed meclizine, add diazepam.  Note yesterday evening patient informed us that she was having multiple neurological issues for the last 3-4 weeks which included at times feeling a little confused, she does monitor told me that at times she has problems picking up objects. She confirmed that this has been going on for several weeks. Did a detailed neurological exam, it was entirely nonfocal, finger to nose test, shin heel to knee test, no dysdiadochokinesis, no pronator drift, no facial droop, no dysarthria. No focal weakness or sensory deficit. Likely patient has had mild encephalopathy due to poorly controlled blood pressure. CT head was repeated remains nonacute. No additional  workup.    Discharge Condition: Stable  Follow UP  Follow-up Information    Follow up with Marianna Payment, MD In 2 weeks.   Specialty:  Orthopedic Surgery   Why:  For wound re-check, For suture removal   Contact information:   [place] [id number] [phone]       Schedule an appointment as soon as possible for a visit with [name], MD.   Specialty:  Internal Medicine   Contact information:   [address] [place] [id number] [phone]        Consults obtained - Ortho, Renal  Diet and Activity recommendation: See Discharge Instructions below  Discharge Instructions        Discharge Instructions    Discharge instructions    Complete by:  As directed   1. Change dressings as needed 2. May shower but keep incisions covered and dry 3. Take lovenox to prevent blood clots 4. Take stool  softeners as needed 5. Take pain meds as needed  Follow with Primary MD [name], MD in 7 days   Get CBC, CMP, 2 view Chest X ray checked  by Primary MD next visit.    Activity: Partial weight bearing on the [name] leg as tolerated with Full fall precautions use walker/cane & assistance as needed   Disposition Home     Diet: Renal Check your Weight same time everyday, if you gain over 2 pounds, or you develop in leg swelling, experience more shortness of breath or chest pain, call your Primary MD immediately. Follow Cardiac Low Salt Diet and 1.2 lit/day fluid restriction.   On your next visit with your primary care physician please Get Medicines reviewed and adjusted.   Please request your Prim.MD to go over all Hospital Tests and Procedure/Radiological results at the follow up, please get all Hospital records sent to your Prim MD by signing hospital release before you go home.   If you experience worsening of your admission symptoms, develop shortness of breath, life threatening emergency, suicidal or homicidal thoughts  you must seek medical attention immediately by calling 911 or calling your MD immediately  if symptoms less severe.  You Must read complete instructions/literature along with all the possible adverse reactions/side effects for all the Medicines you take and that have been prescribed to you. Take any new Medicines after you have completely understood and accpet all the possible adverse reactions/side effects.   Do not drive, operating heavy machinery, perform activities at heights, swimming or participation in water activities or provide baby sitting services if your were admitted for syncope or siezures until you have seen by Primary MD or a Neurologist and advised to do so again.  Do not drive when taking Pain medications.    Do not take more than prescribed Pain, Sleep and Anxiety Medications  Special Instructions: If you have smoked or chewed Tobacco  in the last 2 yrs please stop smoking, stop any regular Alcohol  and or any Recreational drug use.  Wear Seat belts while driving.   Please note  You were cared for by a hospitalist during your hospital stay. If you have any questions about your discharge medications or the care you received while you were in the hospital after you are discharged, you can call the unit and asked to speak with the hospitalist on call if the hospitalist that took care of you is not available. Once you are discharged, your primary care physician will handle any further medical issues. Please note that NO REFILLS for any discharge medications will be authorized once you are discharged, as it is imperative that you return to your primary care physician (or establish a relationship with a primary care physician if you do not have one) for your aftercare needs so that they can reassess your need for medications and monitor your lab values.     Discharge instructions    Complete by:  As directed   1. Change dressings as needed 2. May shower but keep incisions covered  and dry 3. Take lovenox to prevent blood clots 4. Take stool softeners as needed 5. Take pain meds as needed   Follow with Primary MD [name], MD in 7 days   Get CBC, CMP, 2 view Chest X ray checked  by Primary MD next visit.    Activity: Partial weight bearing on the [name] leg as tolerated with Full fall precautions use walker/cane & assistance as needed   Disposition  Home     Diet: Renal Check your Weight same time everyday, if you gain over 2 pounds, or you develop in leg swelling, experience more shortness of breath or chest pain, call your Primary MD immediately. Follow Cardiac Low Salt Diet and 1.2 lit/day fluid restriction.   On your next visit with your primary care physician please Get Medicines reviewed and adjusted.   Please request your Prim.MD to go over all Hospital Tests and Procedure/Radiological results at the follow up, please get all Hospital records sent to your Prim MD by signing hospital release before you go home.   If you experience worsening of your admission symptoms, develop shortness of breath, life threatening emergency, suicidal or homicidal thoughts you must seek medical attention immediately by calling 911 or calling your MD immediately  if symptoms less severe.  You Must read complete instructions/literature along with all the possible adverse reactions/side effects for all the Medicines you take and that have been prescribed to you. Take any new Medicines after you have completely understood and accpet all the possible adverse reactions/side effects.   Do not drive, operating heavy machinery, perform activities at heights, swimming or participation in water activities or provide baby sitting services if your were admitted for syncope or siezures until you have seen by Primary MD or a Neurologist and advised to do so again.  Do not drive when taking Pain medications.    Do not take more than prescribed Pain, Sleep and Anxiety  Medications  Special Instructions: If you have smoked or chewed Tobacco  in the last 2 yrs please stop smoking, stop any regular Alcohol  and or any Recreational drug use.  Wear Seat belts while driving.   Please note  You were cared for by a hospitalist during your hospital stay. If you have any questions about your discharge medications or the care you received while you were in the hospital after you are discharged, you can call the unit and asked to speak with the hospitalist on call if the hospitalist that took care of you is not available. Once you are discharged, your primary care physician will handle any further medical issues. Please note that NO REFILLS for any discharge medications will be authorized once you are discharged, as it is imperative that you return to your primary care physician (or establish a relationship with a primary care physician if you do not have one) for your aftercare needs so that they can reassess your need for medications and monitor your lab values.     Increase activity slowly    Complete by:  As directed      Partial weight bearing    Complete by:  As directed   % Body Weight:  50  Laterality:  left  Extremity:  Lower             Discharge Medications       Medication List    TAKE these medications        alendronate 70 MG tablet  Commonly known as:  FOSAMAX  Take 70 mg by mouth once a week.     allopurinol 300 MG tablet  Commonly known as:  ZYLOPRIM  Take 300 mg by mouth daily.     cloNIDine 0.3 MG tablet  Commonly known as:  CATAPRES  Take 1 tablet (0.3 mg total) by mouth 3 (three) times daily.     docusate sodium 100 MG capsule  Commonly known as:  COLACE  Take 100 mg by  mouth 2 (two) times daily.     enoxaparin 30 MG/0.3ML injection  Commonly known as:  LOVENOX  Inject 0.3 mLs (30 mg total) into the skin daily.     hydrALAZINE 100 MG tablet  Commonly known as:  APRESOLINE  Take 1 tablet (100 mg total) by mouth every 6  (six) hours.     HYDROcodone-acetaminophen 7.5-325 MG tablet  Commonly known as:  NORCO  Take 1-2 tablets by mouth every 6 (six) hours as needed for moderate pain.     levothyroxine 50 MCG tablet  Commonly known as:  SYNTHROID, LEVOTHROID  Take 1 tablet (50 mcg total) by mouth daily.     lidocaine-prilocaine cream  Commonly known as:  EMLA  Apply 1 application topically as needed (on Dialysis days Tues,Thurs.,Sat.).     meclizine 25 MG tablet  Commonly known as:  ANTIVERT  Take 1 tablet by mouth 4 (four) times daily as needed. dizziness     metoCLOPramide 5 MG tablet  Commonly known as:  REGLAN  1 po before breakfast and lunch and at bedtime.     NIFEdipine 90 MG 24 hr tablet  Commonly known as:  PROCARDIA XL/ADALAT-CC  Take 90 mg by mouth daily.     omeprazole 20 MG capsule  Commonly known as:  PRILOSEC  Take 1 capsule (20 mg total) by mouth daily.     ondansetron 4 MG tablet  Commonly known as:  ZOFRAN  Take 4 mg by mouth every 8 (eight) hours as needed for nausea or vomiting.     sevelamer carbonate 800 MG tablet  Commonly known as:  RENVELA  Take 800-2,400 mg by mouth 3 (three) times daily with meals. Patient takes 1 tablet with snack and 3 tablets with meal        Major procedures and Radiology Reports - PLEASE review detailed and final reports for all details, in brief -     Dg Chest 1 View  05/30/2015  CLINICAL DATA:  Fall EXAM: CHEST 1 VIEW COMPARISON:  April 14, 2015 FINDINGS: There is no edema or consolidation. Heart is enlarged with pulmonary vascularity within normal limits. No pneumothorax. No adenopathy. No bone lesions. IMPRESSION: Cardiomegaly.  No edema or consolidation. Electronically Signed   By: Lowella Grip III M.D.   On: 05/30/2015 21:38   Ct Head Wo Contrast  06/03/2015  CLINICAL DATA:  Disoriented status post fall 3 days ago. No reported loss of consciousness. EXAM: CT HEAD WITHOUT CONTRAST TECHNIQUE: Contiguous axial images were  obtained from the base of the skull through the vertex without intravenous contrast. COMPARISON:  CT scan of April 16, 2015. FINDINGS: Bony calvarium appears intact. Mild chronic ischemic white matter disease is noted. Stable meningocele is seen involving the left sphenoid sinus and middle cranial fossa. No mass effect or midline shift is noted. Ventricular size is within normal limits. No evidence of mass lesion, hemorrhage or acute infarction is noted. IMPRESSION: Mild diffuse cortical atrophy is noted. Stable left middle cranial fossa meningocele. No significant change compared to prior exam. Electronically Signed   By: Marijo Conception, M.D.   On: 06/03/2015 15:24   Dg Bone Density  05/09/2015  EXAM: DUAL X-RAY ABSORPTIOMETRY (DXA) FOR BONE MINERAL DENSITY IMPRESSION: Ordering Physician:  Dr. Redmond School, Your patient Sonya [name] completed a BMD test on 05/09/2015 using the [place] (software version: 14.10) manufactured by [employer]. The following summarizes the results of our evaluation. PATIENT BIOGRAPHICAL: Name: Sonya [name], Sonya  [name] Patient ID: [id number] Birth Date: 1951-06-25 Height: 66.0 in. Gender: Female Exam Date: 05/09/2015 Weight: 130.0 lbs. Indications: Bilateral Oophrectomy, Low Calcium Intake, Post Menopausal, Renal Failure Fractures: Treatments: Vitamin D DENSITOMETRY RESULTS: Site          Region     Measured Date Measured Age WHO Classification Young Adult T-score BMD         %Change vs. Previous Significant Change (*) DualFemur Neck Right 05/09/2015 65.9 Osteoporosis -2.5 0.693 g/cm2 Right Forearm Radius 33% 05/09/2015 65.9 Normal -0.2 0.700 g/cm2 ASSESSMENT: BMD as determined from Femur Neck Right is 0.693 g/cm2 with a T-Score of -2.5. This patient is considered osteoporotic according to [facility]) criteria. (Patient does not meet criteria for FRAX assessment.) World Health Organization [facility]) criteria for post-menopausal, Caucasian  Women: Normal:       T-score at or above -1 SD Osteopenia:   T-score between -1 and -2.5 SD Osteoporosis: T-score at or below -2.5 SD RECOMMENDATIONS: [place] recommends that FDA-approved medial therapies be considered in postmenopausal women and men age 81 or older with a: 1. Hip or vertebral (clinical or morphometric) fracture. 2. T-Score of < -2.5 at the spine or hip. 3. Ten-year fracture probability by FRAX of 3% or greater for hip fracture or 20% or greater for major osteoporotic fracture. All treatment decisions require clinical judgment and consideration of individual patient factors, including patient preferences, co-morbidities, previous drug use, risk factors not captured in the FRAX model (e.g. falls, vitamin D deficiency, increased bone turnover, interval significant decline in bone density) and possible under-or over-estimation of fracture risk by FRAX. All patients should ensure an adequate intake of dietary calcium (1200 mg/d) and vitamin D (800 IU daily) unless contraindicated. FOLLOW-UP: People with diagnosed cases of osteoporosis or osteopenia should be regularly tested for bone mineral density. For patients eligible for Medicare, routine testing is allowed once every 2 years. Testing frequency can be increased for patients who have rapidly progressing disease, or for those who are receiving medical therapy to restore bone mass. I have reviewed this report, and agree with the above findings. [facility] Electronically Signed   By: Lowella Grip III M.D.   On: 05/09/2015 11:43   Dg C-arm 1-60 Min  05/31/2015  CLINICAL DATA:  Pinning of left femoral neck fracture. EXAM: DG C-ARM 61-120 MIN; LEFT FEMUR 2 VIEWS COMPARISON:  None. FINDINGS: Intraoperative images were obtained demonstrating placement of a total of 3 separate cannulated pins across the femoral neck which appear in good position. There is no evidence of significant displacement or angulation  across the left femoral neck fracture. IMPRESSION: Near anatomic alignment with good positioning of 3 cannulated pins across the left femoral neck fracture. Electronically Signed   By: Aletta Edouard M.D.   On: 05/31/2015 09:39   Mm Screening Breast Tomo Bilateral  05/12/2015  CLINICAL DATA:  Screening. EXAM: DIGITAL SCREENING BILATERAL MAMMOGRAM WITH 3D TOMO WITH CAD COMPARISON:  Previous exam(s). ACR Breast Density Category c: The breast tissue is heterogeneously dense, which may obscure small masses. FINDINGS: There are no findings suspicious for malignancy. Images were processed with CAD. IMPRESSION: No mammographic evidence of malignancy. A result letter of this screening mammogram will be mailed directly to the patient. RECOMMENDATION: Screening mammogram in one year. (Code:[id number]-B-01Y) BI-RADS CATEGORY  1: Negative. Electronically Signed   By: Fidela Salisbury M.D.   On: 05/12/2015 17:39   Dg Hip Unilat With Pelvis 2-3 Views Left  05/30/2015  CLINICAL DATA:  Pain following fall EXAM: DG HIP (WITH OR WITHOUT PELVIS) 2-3V LEFT COMPARISON:  None. FINDINGS: Frontal pelvis as well as frontal and lateral left hip images were obtained. There is a fracture of the left femoral neck which extends to the subcapital region laterally with slight impaction along the lateral aspect of the subcapital femoral neck region on the left. No other fracture is apparent. No dislocation. There is mild symmetric narrowing of both hip joints. There are scattered foci of arterial vascular calcification bilaterally. IMPRESSION: Nondisplaced fracture of the left femoral neck with mild impaction at the subcapital femoral neck region with laterally on the left. No other fracture. No dislocation. There is mild symmetric narrowing of both hip joints. Electronically Signed   By: Lowella Grip III M.D.   On: 05/30/2015 20:44   Dg Femur Min 2 Views Left  05/31/2015  CLINICAL DATA:  Pinning of left femoral neck fracture. EXAM: DG  C-ARM 61-120 MIN; LEFT FEMUR 2 VIEWS COMPARISON:  None. FINDINGS: Intraoperative images were obtained demonstrating placement of a total of 3 separate cannulated pins across the femoral neck which appear in good position. There is no evidence of significant displacement or angulation across the left femoral neck fracture. IMPRESSION: Near anatomic alignment with good positioning of 3 cannulated pins across the left femoral neck fracture. Electronically Signed   By: Aletta Edouard M.D.   On: 05/31/2015 09:39   Micro Results   Recent Results (from the past 240 hour(s))  Surgical pcr screen     Status: None   Collection Time: 05/31/15  6:35 AM  Result Value Ref Range Status   MRSA, PCR NEGATIVE NEGATIVE Final   Staphylococcus aureus NEGATIVE NEGATIVE Final    Comment:        The Xpert SA Assay (FDA approved for NASAL specimens in patients over 15 years of age), is one component of a comprehensive surveillance program.  Test performance has been validated by [facility] for patients greater than or equal to 74 year old. It is not intended to diagnose infection nor to guide or monitor treatment.     Today   Subjective    Sonya [name] today has no headache,no chest abdominal pain,no new weakness tingling or numbness, feels much better wants to go home today.     Objective   Blood pressure 161/62, pulse 63, temperature 97.2 F (36.2 C), temperature source Oral, resp. rate 16, height 5\' 6"  (1.676 m), weight 63.4 kg (139 lb 12.4 oz), SpO2 99 %.   Intake/Output Summary (Last 24 hours) at 06/04/15 1044 Last data filed at 06/03/15 1700  Gross per 24 hour  Intake    360 ml  Output   1416 ml  Net  -1056 ml    Exam  Awake Alert, Oriented x 3, No new F.N deficits, Normal affect [place].AT,PERRAL Supple Neck,No JVD, No cervical lymphadenopathy appriciated.  Symmetrical Chest wall movement, Good air movement bilaterally, CTAB RRR,No Gallops,Rubs or new Murmurs, No Parasternal Heave +ve  B.Sounds, Abd Soft, Non tender, No organomegaly appriciated, No rebound -guarding or rigidity. No Cyanosis, Clubbing or edema, No new Rash or bruise.    Data Review   CBC w Diff:  Lab Results  Component Value Date   WBC 4.3 06/03/2015   HGB 10.8* 06/03/2015   HCT 33.9* 06/03/2015   PLT 160 06/03/2015   LYMPHOPCT 16 05/30/2015   MONOPCT 9 05/30/2015   EOSPCT 1 05/30/2015   BASOPCT 0 05/30/2015    CMP:  Lab Results  Component Value Date   NA 125* 06/03/2015   K 5.1 06/03/2015   CL 89* 06/03/2015   CO2 22 06/03/2015   BUN 49* 06/03/2015   CREATININE 8.01* 06/03/2015   CREATININE 4.40* 06/07/2013   PROT 7.3 04/14/2015   ALBUMIN 2.5* 06/03/2015   BILITOT 0.6 04/14/2015   ALKPHOS 41 04/14/2015   AST 14* 04/14/2015   ALT 9* 04/14/2015  .   Total Time in preparing paper work, data evaluation and todays exam - 35 minutes  Thurnell Lose M.D on 06/04/2015 at 10:44 AM  [facility]   Office  [phone]

## 2015-06-02 NOTE — Progress Notes (Signed)
   Subjective:  Patient reports pain as mild.    Objective:   VITALS:   Filed Vitals:   06/01/15 0541 06/01/15 0952 06/01/15 2011 06/02/15 0557  BP: 152/53 173/68 149/64 131/52  Pulse: 64  65 59  Temp: 98.7 F (37.1 C)  98.4 F (36.9 C) 98.3 F (36.8 C)  TempSrc: Oral  Oral Oral  Resp: 17  16 16   Height:      Weight:      SpO2: 100%  99% 100%    Neurologically intact Neurovascular intact Sensation intact distally Intact pulses distally Dorsiflexion/Plantar flexion intact Incision: dressing C/D/I and no drainage No cellulitis present Compartment soft   Lab Results  Component Value Date   WBC 6.2 06/02/2015   HGB 10.8* 06/02/2015   HCT 34.1* 06/02/2015   MCV 74.8* 06/02/2015   PLT 168 06/02/2015     Assessment/Plan:  2 Days Post-Op   - doing well with PT - stable from ortho standpoint - may dc once she clears PT  Marianna Payment 06/02/2015, 7:48 AM [phone]

## 2015-06-02 NOTE — Evaluation (Signed)
Occupational Therapy Evaluation Patient Details Name: Sonya [name] MRN: [id number] DOB: August 28, 1950 Today's Date: 06/02/2015    History of Present Illness 64 y.o. female s/p fall with left hip fracture and underwent L hip ORIF.  PMH significant for ESRD, HTN, and vertigo.   Clinical Impression   Pt reports feeling very weak, extensive discussion with pt/husband re: pt ability to maintain PWB LLE and husbands ability to assist pt at d/c. Pt participated in ADL retraining session for functional mob/transfer to 3:1 taking ~4-5 steps & was max assist using RW and Max vc's for PWB, pt sates that she is unable to maintain PWB during standing and functional transfers secondary to UE weakness and decreased endurance/activity tolerance. Recommend STR at SNF at this time secondary to overall deconditiong and decreased ability to maintain PWB LLE. All further needs can be addressed at next venue.    Follow Up Recommendations  SNF;Supervision/Assistance - 24 hour    Equipment Recommendations  Other (comment) (Defer to next venue)    Recommendations for Other Services       Precautions / Restrictions Precautions Precautions: Fall Restrictions Weight Bearing Restrictions: Yes LLE Weight Bearing: Partial weight bearing LLE Partial Weight Bearing Percentage or Pounds: 50 Other Position/Activity Restrictions: Pt with difficulty maintaining PWB during transfers and functional mobility despite max vc's      Mobility Bed Mobility               General bed mobility comments: Pt up in chair  Transfers Overall transfer level: Needs assistance Equipment used: Rolling walker (2 wheeled) Transfers: Sit to/from [employer] Transfers Sit to Stand: Max assist;From elevated surface Stand pivot transfers: Max assist;From elevated surface       General transfer comment: VC's for safety, sequencing and PWB LLE - pt with difficulty maintaining despite max vc's & tactile cues    Balance  Overall balance assessment: Needs assistance Sitting-balance support: Bilateral upper extremity supported;Feet supported Sitting balance-Leahy Scale: Good     Standing balance support: Bilateral upper extremity supported Standing balance-Leahy Scale: Poor                              ADL Overall ADL's : Needs assistance/impaired     Grooming: Wash/dry hands;Wash/dry face;Set up;Sitting;Minimal assistance   Upper Body Bathing: Minimal assitance;Sitting   Lower Body Bathing: Maximal assistance;Sit to/from stand;Cueing for safety;Cueing for sequencing   Upper Body Dressing : Minimal assistance;Sitting   Lower Body Dressing: Maximal assistance;Sit to/from stand;Cueing for sequencing;Cueing for safety   Toilet Transfer: Maximal assistance;BSC;RW;Ambulation Toilet Transfer Details (indicate cue type and reason): Pt was max assist taking 4-5 steps to [facility] using RW and Max vc's for PWB, pt sates that she is unable to maintain PWB during standing and functional transfers secondary to UE weakness and decreased enducance and activity tolerance Toileting- Clothing Manipulation and Hygiene: Minimal assistance;Sitting/lateral lean       Functional mobility during ADLs: Moderate assistance;Maximal assistance;Rolling walker;Cueing for safety;Cueing for sequencing General ADL Comments: Pt reports feeling very weak, extensive discussion with pt/husband re: pt ability to maintain PWB LLE and husbands ability to assist pt at d/c. Pt participated in ADL retraining session for functional mob/transfer to 3:1 taking ~4-5 steps & was max assist using RW and Max vc's for PWB, pt sates that she is unable to maintain PWB during standing and functional transfers secondary to UE weakness and decreased enducance and activity tolerance. Recommend STR at SNF at this  time secondary to overall deconditiong and decreased ability to maintain PWB LLE.      Vision  No changes from baseline   Perception      Praxis      Pertinent Vitals/Pain Pain Assessment: 0-10 Pain Score: 5  Pain Location: L hip Pain Descriptors / Indicators: Sore;Aching Pain Intervention(s): Limited activity within patient's tolerance;Monitored during session;Repositioned;Patient requesting pain meds-RN notified     Hand Dominance Right   Extremity/Trunk Assessment Upper Extremity Assessment Upper Extremity Assessment: Generalized weakness (Pt presents with bilateral UE weakness L > R)   Lower Extremity Assessment Lower Extremity Assessment: Defer to PT evaluation       Communication Communication Communication: No difficulties   Cognition Arousal/Alertness: Awake/alert Behavior During Therapy: WFL for tasks assessed/performed Overall Cognitive Status: Within Functional Limits for tasks assessed                     General Comments       Exercises       Shoulder Instructions      Home Living Family/patient expects to be discharged to:: Private residence Living Arrangements: Spouse/significant other;Children Available Help at Discharge: Family;Available 24 hours/day Type of Home: House Home Access: Stairs to enter [employer] of Steps: 1 Entrance Stairs-Rails: None Home Layout: One level;Other (Comment)     Bathroom Shower/Tub: Tub/shower unit Shower/tub characteristics: Curtain Biochemist, clinical: Standard     Home Equipment: Environmental consultant - 4 wheels;Bedside commode;Cane - single point          Prior Functioning/Environment Level of Independence: Independent with assistive device(s)        Comments: daughter typically drives her to HD    OT Diagnosis: Generalized weakness;Acute pain   OT Problem List: Decreased strength;Decreased activity tolerance;Impaired balance (sitting and/or standing);Decreased knowledge of precautions;Decreased knowledge of use of DME or AE;Pain;Impaired UE functional use   OT Treatment/Interventions: Self-care/ADL training;DME and/or AE  instruction;Therapeutic activities;Patient/family education;Balance training;Therapeutic exercise    OT Goals(Current goals can be found in the care plan section) Acute Rehab OT Goals Patient Stated Goal: To get stronger and go home after STR OT Goal Formulation: With family Time For Goal Achievement: 06/16/15 Potential to Achieve Goals: Good  OT Frequency: Min 2X/week   Barriers to D/C: Other (comment) (Husband expresses concern re: caring for her as she is needing more assistance than anticipated)          Co-evaluation              End of Session Equipment Utilized During Treatment: Gait belt;Rolling walker Nurse Communication: Mobility status;Patient requests pain meds;Other (comment) (OT recommending SNF Rehab & family verbally agrees)  Activity Tolerance: Patient limited by fatigue;Patient limited by lethargy;Patient limited by pain Patient left: in chair;with call bell/phone within reach;with family/visitor present   Time: [id number] OT Time Calculation (min): 38 min Charges:  OT General Charges $OT Visit: 1 Procedure OT Evaluation $Initial OT Evaluation Tier I: 1 Procedure OT Treatments $Self Care/Home Management : 8-22 mins G-Codes:    Almyra Deforest , OTR/L  06/02/2015, 12:06 PM

## 2015-06-02 NOTE — Progress Notes (Signed)
Physical Therapy Treatment Patient Details Name: Sonya [name] MRN: [id number] DOB: 02-23-1951 Today's Date: 06/02/2015    History of Present Illness 64 y.o. female s/p fall with left hip fracture and underwent L hip ORIF.  PMH significant for ESRD, HTN, and vertigo.    PT Comments    The pt. was able to gait train for a longer distance during treatment today but continues to have trouble maintaining WB restrictions.  Gait is very slow.  She was able to complete all sitting exercises and did well with them.  Pt.'s family member was in room during treatment and both pt. and family member seem to be in agreement with SNF placement.     Follow Up Recommendations  Supervision for mobility/OOB;SNF     Equipment Recommendations  Wheelchair (measurements PT)       Precautions / Restrictions Precautions Precautions: Fall Restrictions Weight Bearing Restrictions: Yes LLE Weight Bearing: Partial weight bearing LLE Partial Weight Bearing Percentage or Pounds: 50 Other Position/Activity Restrictions: Pt. continues to have difficulty maintaining PWB during transfers and ambulation and requires max verbal cues to maintain restrictions    Mobility  Bed Mobility               General bed mobility comments: Pt up in chair  Transfers Overall transfer level: Needs assistance Equipment used: Rolling walker (2 wheeled) Transfers: Sit to/from Stand Sit to Stand: Max assist Stand pivot transfers: Max assist;From elevated surface       General transfer comment: verbal cues for hand placement and to maintain PWB LLE  Ambulation/Gait Ambulation/Gait assistance: Min assist;+2 safety/equipment (2nd person following with chair) Ambulation Distance (Feet): 40 Feet Assistive device: Rolling walker (2 wheeled) Gait Pattern/deviations: Step-to pattern;Decreased stride length;Trunk flexed Gait velocity: decreased   General Gait Details: gait very slow, but no LOB. Verbal cues to keep  walker a safe distance ahead of pt. but not too close or too far away.  Cues for posture and max. verbal cues to maintain WB restrictions; cues to bear weight through arms.  Rest break x2.        Balance Overall balance assessment: Needs assistance Sitting-balance support: Bilateral upper extremity supported;Feet supported Sitting balance-Leahy Scale: Good     Standing balance support: Bilateral upper extremity supported Standing balance-Leahy Scale: Poor                      Cognition Arousal/Alertness: Awake/alert Behavior During Therapy: WFL for tasks assessed/performed Overall Cognitive Status: Within Functional Limits for tasks assessed                      Exercises Total Joint Exercises Ankle Circles/Pumps: AROM;Both;20 reps Quad Sets: AROM;Left;10 reps Short Arc Quad: AROM;Left;10 reps Heel Slides: AROM;AAROM;Left;10 reps Hip ABduction/ADduction: AROM;AAROM;10 reps;Left (hip abduction)    General Comments General comments (skin integrity, edema, etc.): The pt. continues to be motivated and works hard in therapy.  Maintaining WB status was difficult during ambulation due to pt.'s weak arms.        Pertinent Vitals/Pain Pain Assessment: No/denies pain Pain Score: 5  Pain Location: L hip Pain Descriptors / Indicators: Sore;Aching Pain Intervention(s): Limited activity within patient's tolerance;Monitored during session;Repositioned;Patient requesting pain meds-RN notified    Home Living Family/patient expects to be discharged to:: Private residence Living Arrangements: Spouse/significant other;Children Available Help at Discharge: Family;Available 24 hours/day Type of Home: House Home Access: Stairs to enter Entrance Stairs-Rails: None Home Layout: One level;Other (Comment) Home Equipment: Gilford Rile -  4 wheels;Bedside commode;Cane - single point      Prior Function Level of Independence: Independent with assistive device(s)      Comments:  daughter typically drives her to HD   PT Goals (current goals can now be found in the care plan section) Acute Rehab PT Goals Patient Stated Goal: To get stronger and go home after STR Progress towards PT goals: Progressing toward goals    Frequency  Min 5X/week    PT Plan Current plan remains appropriate       End of Session Equipment Utilized During Treatment: Gait belt Activity Tolerance: Patient tolerated treatment well Patient left: in chair;with call bell/phone within reach;with family/visitor present     Time: 1226-1258 PT Time Calculation (min) (ACUTE ONLY): 32 min  Charges:  1 Gait, [place], [place] - Office   06/02/2015, 2:03 PM

## 2015-06-02 NOTE — Clinical Social Work Note (Signed)
Clinical Social Worker received standing order referral for possible ST-SNF placement.  Chart reviewed.  PT/OT recommending home with home health.  Spoke with RN Case Manager who will follow up with patient to discuss home health needs.    CSW signing off - please re consult if social work needs arise.  [name], [place]

## 2015-06-02 NOTE — Discharge Instructions (Signed)
1. Change dressings as needed 2. May shower but keep incisions covered and dry 3. Take lovenox to prevent blood clots 4. Take stool softeners as needed 5. Take pain meds as needed   Follow with Primary MD [name], MD in 7 days   Get CBC, CMP, 2 view Chest X ray checked  by Primary MD next visit.    Activity: Partial weight bearing on the L leg as tolerated with Full fall precautions use walker/cane & assistance as needed   Disposition Home     Diet: Renal Check your Weight same time everyday, if you gain over 2 pounds, or you develop in leg swelling, experience more shortness of breath or chest pain, call your Primary MD immediately. Follow Cardiac Low Salt Diet and 1.2 lit/day fluid restriction.   On your next visit with your primary care physician please Get Medicines reviewed and adjusted.   Please request your Prim.MD to go over all Hospital Tests and Procedure/Radiological results at the follow up, please get all Hospital records sent to your Prim MD by signing hospital release before you go home.   If you experience worsening of your admission symptoms, develop shortness of breath, life threatening emergency, suicidal or homicidal thoughts you must seek medical attention immediately by calling 911 or calling your MD immediately  if symptoms less severe.  You Must read complete instructions/literature along with all the possible adverse reactions/side effects for all the Medicines you take and that have been prescribed to you. Take any new Medicines after you have completely understood and accpet all the possible adverse reactions/side effects.   Do not drive, operating heavy machinery, perform activities at heights, swimming or participation in water activities or provide baby sitting services if your were admitted for syncope or siezures until you have seen by Primary MD or a Neurologist and advised to do so again.  Do not drive when taking Pain medications.    Do  not take more than prescribed Pain, Sleep and Anxiety Medications  Special Instructions: If you have smoked or chewed Tobacco  in the last 2 yrs please stop smoking, stop any regular Alcohol  and or any Recreational drug use.  Wear Seat belts while driving.   Please note  You were cared for by a hospitalist during your hospital stay. If you have any questions about your discharge medications or the care you received while you were in the hospital after you are discharged, you can call the unit and asked to speak with the hospitalist on call if the hospitalist that took care of you is not available. Once you are discharged, your primary care physician will handle any further medical issues. Please note that NO REFILLS for any discharge medications will be authorized once you are discharged, as it is imperative that you return to your primary care physician (or establish a relationship with a primary care physician if you do not have one) for your aftercare needs so that they can reassess your need for medications and monitor your lab values.

## 2015-06-03 ENCOUNTER — Inpatient Hospital Stay (HOSPITAL_COMMUNITY): Payer: Medicare Other

## 2015-06-03 DIAGNOSIS — Z419 Encounter for procedure for purposes other than remedying health state, unspecified: Secondary | ICD-10-CM | POA: Insufficient documentation

## 2015-06-03 LAB — RENAL FUNCTION PANEL
Albumin: 2.5 g/dL — ABNORMAL LOW (ref 3.5–5.0)
Anion gap: 14 (ref 5–15)
BUN: 49 mg/dL — ABNORMAL HIGH (ref 6–20)
CALCIUM: 8.6 mg/dL — AB (ref 8.9–10.3)
CHLORIDE: 89 mmol/L — AB (ref 101–111)
CO2: 22 mmol/L (ref 22–32)
CREATININE: 8.01 mg/dL — AB (ref 0.44–1.00)
GFR, EST AFRICAN AMERICAN: 5 mL/min — AB (ref >60)
GFR, EST NON AFRICAN AMERICAN: 5 mL/min — AB (ref >60)
Glucose, Bld: 128 mg/dL — ABNORMAL HIGH (ref 65–99)
Phosphorus: 7.7 mg/dL — ABNORMAL HIGH (ref 2.5–4.6)
Potassium: 5.1 mmol/L (ref 3.5–5.1)
SODIUM: 125 mmol/L — AB (ref 135–145)

## 2015-06-03 LAB — CBC
HCT: 33.9 % — ABNORMAL LOW (ref 36.0–46.0)
Hemoglobin: 10.8 g/dL — ABNORMAL LOW (ref 12.0–15.0)
MCH: 23.4 pg — ABNORMAL LOW (ref 26.0–34.0)
MCHC: 31.9 g/dL (ref 30.0–36.0)
MCV: 73.4 fL — AB (ref 78.0–100.0)
PLATELETS: 160 10*3/uL (ref 150–400)
RBC: 4.62 MIL/uL (ref 3.87–5.11)
RDW: 21.3 % — ABNORMAL HIGH (ref 11.5–15.5)
WBC: 4.3 10*3/uL (ref 4.0–10.5)

## 2015-06-03 MED ORDER — HYDRALAZINE HCL 20 MG/ML IJ SOLN
10.0000 mg | Freq: Once | INTRAMUSCULAR | Status: AC
  Administered 2015-06-03: 10 mg via INTRAVENOUS
  Filled 2015-06-03: qty 1

## 2015-06-03 MED ORDER — HYDRALAZINE HCL 20 MG/ML IJ SOLN: 10.0000 mg | Freq: Four times a day (QID) | INTRAMUSCULAR | Status: DC | PRN

## 2015-06-03 MED ORDER — DM-GUAIFENESIN ER 30-600 MG PO TB12
1.0000 | ORAL_TABLET | Freq: Two times a day (BID) | ORAL | Status: DC
  Administered 2015-06-03 – 2015-06-04 (×3): 1 via ORAL
  Filled 2015-06-03 (×5): qty 1

## 2015-06-03 NOTE — Progress Notes (Signed)
Received call from SW that patient feels like she may have had a stroke. VS obtained by tech. Notified MD, Dr Candiss Norse, of her statement, elevated BP, that she has just returned from dialysis and has not had her morning medications including hypertensive Rx's. Requested to complete a stroke screen and this RN affirmed would give AM medications shortly.

## 2015-06-03 NOTE — Progress Notes (Signed)
MD notified of continued BP elevation. Orders received.

## 2015-06-03 NOTE — Clinical Social Work Note (Signed)
CSW updated [facility] patient not discharging on 06/03/2015. CSW will follow-up on Wednesday 06/04/2015 regarding discharge.  Lubertha Sayres, [place] Clinical Social Work Department Orthopedics [phone]) and Surgical [phone])

## 2015-06-03 NOTE — Procedures (Signed)
I was present at this dialysis session. I have reviewed the session itself and made appropriate changes.   UF goal 1.5L.  2K bath.  No c/o, tolerating well.  AVF.    Pearson Grippe  MD 06/03/2015, 9:46 AM

## 2015-06-03 NOTE — Clinical Social Work Note (Signed)
CSW spoke with patient regarding bed availability at [facility] [place] for today. Patient informed CSW patient "not feeling well." Patient stated "I need to speak with the Doctor, I'm disoriented and feel like I have had another stroke."  CSW immediately contacted patient's RN regarding patient's concern. RN aware.  CSW awaiting confirmation patient medically stable to discharge on 06/03/2015.  Sonya [name], [place] Clinical Social Work Department Orthopedics [phone]) and Surgical [phone])

## 2015-06-03 NOTE — Progress Notes (Signed)
Hydralazine 10 mg IV for elevated BR.

## 2015-06-03 NOTE — Progress Notes (Signed)
PT Cancellation Note  Patient Details Name: Sonya [name] MRN: [id number] DOB: [birth year]   Cancelled Treatment:    Reason Eval/Treat Not Completed: Patient at procedure or test/unavailable.  Pt in HD all AM  PT unable to return in PM.  We will check back tomorrow.    Thanks,    Barbarann Ehlers. Raekwan Spelman, PT, DPT [phone]   06/03/2015, 4:28 PM

## 2015-06-03 NOTE — NC FL2 (Signed)
[place] LEVEL OF CARE SCREENING TOOL     IDENTIFICATION  Patient Name: Sonya [name] Birthdate: [birth year] Sex: female Admission Date (Current Location): 05/30/2015  [facility] and [place] Number:     Facility and Address:  [facility], [place] [address], [place], [place] [address]      Provider Number: [id number]  Attending Physician Name and Address:  Thurnell Lose, MD  Relative Name and Phone Number:       Current Level of Care: Hospital Recommended Level of Care: [place] Prior Approval Number:    Date Approved/Denied:   PASRR Number:   [id number]  Discharge Plan: SNF    Current Diagnoses: Patient Active Problem List   Diagnosis Date Noted  . Fracture of femoral neck, left, closed 05/30/2015  . Hip fracture ([place]) 05/30/2015  . Closed fracture of neck of left femur ([place])   . Loss of weight   . Emesis   . Nausea with vomiting 05/13/2015  . UTI (lower urinary tract infection) 04/14/2015  . Orthostatic hypotension 11/27/2014  . Accelerated hypertension 11/25/2014  . Dizziness   . Uncontrolled hypertension   . Acute encephalopathy   . Confusion   . Protein-calorie malnutrition, severe ([place]) 10/15/2014  . Dysphagia 10/15/2014  . Hypertensive emergency 10/14/2014  . Hypertensive urgency, malignant 10/14/2014  . Intracerebral bleed ([place]) 10/14/2014  . End stage renal disease ([place]) 08/31/2013  . Symptomatic anemia 08/18/2013  . Hyperparathyroidism due to renal insufficiency ([place]) 07/09/2013  . Unspecified constipation 06/21/2013  . Gastritis 06/21/2013  . Anemia 06/20/2013  . Dizzy 06/20/2013  . Chronic kidney disease 06/20/2013  . Anemia in chronic kidney disease 06/08/2013  . Regurgitation 06/08/2013  . Hypokalemia 06/08/2013  . Gout 06/08/2013  . Other dysphagia 06/07/2013  . Hypertensive urgency 06/07/2013  . [place] 04/08/2011  . Essential hypertension 04/08/2011  . Colon cancer screening  04/08/2011    Orientation ACTIVITIES/SOCIAL BLADDER RESPIRATION    Self, Time, Situation, Place  Active Continent Normal  BEHAVIORAL SYMPTOMS/MOOD NEUROLOGICAL BOWEL NUTRITION STATUS  Other (Comment) (n/a)  (n/a) Continent Diet (Renal (fluid restriction). Please see discharge summary for any changes.)  PHYSICIAN VISITS COMMUNICATION OF NEEDS Height & Weight Skin    Verbally 5\' 6"  (167.6 cm) 139 lbs. Surgical wounds (L hip)          AMBULATORY STATUS RESPIRATION    Assist independent Normal      Personal Care Assistance Level of Assistance  Bathing, Feeding, Dressing Bathing Assistance: Limited assistance Feeding assistance: Independent Dressing Assistance: Limited assistance      Functional Limitations Info   (n/a)             [place]  PT (By licensed PT), OT (By licensed OT)     PT Frequency: 5 OT Frequency: 5           Additional Factors Info  Code Status, Allergies, Psychotropic Code Status Info: FULL Allergies Info: Cephalexin Psychotropic Info: Valium         Current Medications (06/03/2015): Current Facility-Administered Medications  Medication Dose Route Frequency Provider Last Rate Last Dose  . acetaminophen (TYLENOL) tablet 650 mg  650 mg Oral Q6H PRN Naiping Ephriam Jenkins, MD       Or  . acetaminophen (TYLENOL) suppository 650 mg  650 mg Rectal Q6H PRN Naiping Ephriam Jenkins, MD      . acetaminophen (TYLENOL) tablet 1,000 mg  1,000 mg Oral Q8H Shanker Kristeen Mans, MD  1,000 mg at 06/03/15 0539  . albuterol (PROVENTIL) (2.5 MG/3ML) 0.083% nebulizer solution 2.5 mg  2.5 mg Nebulization Q2H PRN Jonetta Osgood, MD      . allopurinol (ZYLOPRIM) tablet 150 mg  150 mg Oral Daily Jonetta Osgood, MD   150 mg at 06/02/15 1033  . cloNIDine (CATAPRES) tablet 0.3 mg  0.3 mg Oral TID Thurnell Lose, MD   0.3 mg at 06/02/15 1703  . [START ON 06/07/2015] Darbepoetin Alfa (ARANESP) injection 40 mcg  40 mcg Intravenous Q Sat-HD Shanker Kristeen Mans, MD       . dextromethorphan-guaiFENesin [facility] DM) 30-600 MG per 12 hr tablet 1 tablet  1 tablet Oral BID Thurnell Lose, MD   1 tablet at 06/03/15 0851  . diazepam (VALIUM) tablet 2 mg  2 mg Oral Q12H PRN Jonetta Osgood, MD   2 mg at 06/02/15 0940  . doxercalciferol (HECTOROL) capsule 2.5 mcg  2.5 mcg Oral Once per day on Mon Wed Fri Fleet Contras, MD   2.5 mcg at 06/02/15 1033  . enoxaparin (LOVENOX) injection 30 mg  30 mg Subcutaneous Q24H Lyndee Leo, RPH   30 mg at 06/02/15 [id number]  . hydrALAZINE (APRESOLINE) tablet 100 mg  100 mg Oral TID Fleet Contras, MD   100 mg at 06/02/15 2254  . labetalol (NORMODYNE) tablet 300 mg  300 mg Oral BID Fleet Contras, MD   300 mg at 06/02/15 2254  . levothyroxine (SYNTHROID, LEVOTHROID) tablet 50 mcg  50 mcg Oral QAC breakfast Jonetta Osgood, MD   50 mcg at 06/02/15 0856  . meclizine (ANTIVERT) tablet 25 mg  25 mg Oral TID PRN Jonetta Osgood, MD   25 mg at 06/01/15 2145  . metoCLOPramide (REGLAN) tablet 5 mg  5 mg Oral TID AC Shanker Kristeen Mans, MD   5 mg at 06/02/15 1758  . morphine 2 MG/ML injection 0.5 mg  0.5 mg Intravenous Q2H PRN Leandrew Koyanagi, MD      . multivitamin (RENA-VIT) tablet 1 tablet  1 tablet Oral QHS Fleet Contras, MD   1 tablet at 06/02/15 2254  . NIFEdipine (PROCARDIA XL/ADALAT-CC) 24 hr tablet 90 mg  90 mg Oral Daily Fleet Contras, MD   90 mg at 06/02/15 1033  . ondansetron (ZOFRAN) tablet 4 mg  4 mg Oral Q6H PRN Naiping Ephriam Jenkins, MD       Or  . ondansetron [facility]) injection 4 mg  4 mg Intravenous Q6H PRN Naiping Ephriam Jenkins, MD      . oxyCODONE (Oxy IR/ROXICODONE) immediate release tablet 5-10 mg  5-10 mg Oral Q4H PRN Leandrew Koyanagi, MD   5 mg at 06/02/15 1757  . pantoprazole (PROTONIX) EC tablet 40 mg  40 mg Oral Daily Jonetta Osgood, MD   40 mg at 06/02/15 1033  . polyethylene glycol (MIRALAX / GLYCOLAX) packet 17 g  17 g Oral BID Jonetta Osgood, MD   17 g at 06/02/15 2255  . sevelamer carbonate (RENVELA) tablet 2,400  mg  2,400 mg Oral TID WC Fleet Contras, MD   2,400 mg at 06/02/15 1222   Do not use this list as official medication orders. Please verify with discharge summary.  Discharge Medications:   Medication List    TAKE these medications        alendronate 70 MG tablet  Commonly known as:  FOSAMAX  Take 70 mg by mouth once a week.     allopurinol 300  MG tablet  Commonly known as:  ZYLOPRIM  Take 300 mg by mouth daily.     cloNIDine 0.3 MG tablet  Commonly known as:  CATAPRES  Take 1 tablet (0.3 mg total) by mouth 3 (three) times daily.     docusate sodium 100 MG capsule  Commonly known as:  COLACE  Take 100 mg by mouth 2 (two) times daily.     enoxaparin 30 MG/0.3ML injection  Commonly known as:  LOVENOX  Inject 0.3 mLs (30 mg total) into the skin daily.     hydrALAZINE 100 MG tablet  Commonly known as:  APRESOLINE  Take 100 mg by mouth 3 (three) times daily.     HYDROcodone-acetaminophen 7.5-325 MG tablet  Commonly known as:  NORCO  Take 1-2 tablets by mouth every 6 (six) hours as needed for moderate pain.     levothyroxine 50 MCG tablet  Commonly known as:  SYNTHROID, LEVOTHROID  Take 1 tablet (50 mcg total) by mouth daily.     lidocaine-prilocaine cream  Commonly known as:  EMLA  Apply 1 application topically as needed (on Dialysis days Tues,Thurs.,Sat.).     meclizine 25 MG tablet  Commonly known as:  ANTIVERT  Take 1 tablet by mouth 4 (four) times daily as needed. dizziness     metoCLOPramide 5 MG tablet  Commonly known as:  REGLAN  1 po before breakfast and lunch and at bedtime.     NIFEdipine 90 MG 24 hr tablet  Commonly known as:  PROCARDIA XL/ADALAT-CC  Take 90 mg by mouth daily.     omeprazole 20 MG capsule  Commonly known as:  PRILOSEC  Take 1 capsule (20 mg total) by mouth daily.     ondansetron 4 MG tablet  Commonly known as:  ZOFRAN  Take 4 mg by mouth every 8 (eight) hours as needed for nausea or vomiting.     sevelamer carbonate 800 MG  tablet  Commonly known as:  RENVELA  Take 800-2,400 mg by mouth 3 (three) times daily with meals. Patient takes 1 tablet with snack and 3 tablets with meal        Relevant Imaging Results:  Relevant Lab Results:  Recent Labs    Additional [place], [place], [place]

## 2015-06-03 NOTE — Progress Notes (Signed)
Nutrition Brief Note  Patient identified on the Malnutrition Screening Tool (MST) Report  Wt Readings from Last 15 Encounters:  06/03/15 139 lb 12.4 oz (63.4 kg)  05/13/15 136 lb 9.6 oz (61.961 kg)  04/24/15 130 lb (58.968 kg)  04/17/15 136 lb 11 oz (62 kg)  11/27/14 146 lb (66.225 kg)  11/26/14 146 lb (66.225 kg)  11/25/14 146 lb (66.225 kg)  11/25/14 146 lb (66.225 kg)  10/22/14 129 lb 10.1 oz (58.8 kg)  10/16/14 141 lb 1.5 oz (64 kg)  08/19/14 165 lb (74.844 kg)  05/01/14 166 lb (75.297 kg)  12/07/13 171 lb (77.565 kg)  12/03/13 180 lb (81.647 kg)  10/29/13 180 lb (81.647 kg)    Body mass index is 22.57 kg/(m^2). Patient meets criteria for normal based on current BMI. Pt reports usual body weight of ~130 lbs.   Current diet order is renal with 1200 ml fluids, patient is consuming approximately 50-100% of meals at this time. Pt reports appetite is good currently and PTA with consumption of at least 2 meals a day with a prostat once daily. Pt describes no other difficulties. Plans for discharge today. Labs and medications reviewed.   No nutrition interventions warranted at this time. If nutrition issues arise, please consult RD.   Corrin Parker, MS, RD, LDN Pager # [phone] After hours/ weekend pager # [phone]

## 2015-06-03 NOTE — Progress Notes (Signed)
Patient's speech is slightly slurred and at times difficult to understand. If asked to repeat, if informed she was not understood, is able to speak clearly. Husband states is how she is, "she sometimes just mumbles."

## 2015-06-03 NOTE — Progress Notes (Signed)
PATIENT DETAILS Name: Sonya [name] Age: 64 y.o. Sex: female Date of Birth: 01-May-1951 Admit Date: 05/30/2015 Admitting Physician Phillips Grout, MD [id number] J., MD  Brief summary:  64 year old female with history of hemodialysis, hypertension, history of intracranial hemorrhage, admitted with left hip pain following a mechanical fall. Further evaluation demonstrated a left femoral neck fracture. Admitted and underwent hip repair on 10/29.  She underwent hip repair surgery and did well, she was discharged on 06/03/2015 however before being transported she complained that she was feeling confused and different. She thought she had a stroke. No focal deficits. Blood pressure was high as her blood pressure medications were held for dialysis. Likely has developed mild hypertensive encephalopathy. Blood pressure medications provided. We'll keep patient for one more day.   Subjective:  Undergoing dialysis, no headache no chest or abdominal pain.  He reported to the nurse that she felt a little dizzy, confused and thought she could have had a stroke.  Assessment/Plan:  Fracture of femoral neck, left, closed: Secondary to a mechanical fall. No syncopal episode. Pharmacy consulted and recommending Lovenox for VTE prophylaxis (history of ESRD/ICH) cleared by pharmacy and Dr. Arnoldo Morale neurosurgery. She'll weightbearing on the left side as tolerated, minimize narcotics, place on bowel regimen. Seen by physical and will be discharged to SNF once stable.   ESRD: On hemodialysis-TTS-renal following  Hypertension:Uncontrolled-but better than the previous day-continue clonidine with increased and adjusted dose, labetalol, Procardia and hydralazine. Back pressure high today as medications were held for dialysis. Last night was stable. Continue present regimen and monitor.  Anemia in chronic kidney disease: On darbepoetin per nephrology. Hemoglobin currently close to usual  baseline  Hypothyroidism: Continue levothyroxine  Gout: Continue with allopurinol  GERD: Continue PPI  ? History of gastroparesis: Recently seen by gastroenterology-recommendations are to start Reglan before meals-continue  History of intracranial hemorrhage (March 2016): Nonfocal exam  History of chronic intermittent vertigo: Continue as needed meclizine, add diazepam.     Disposition: Remain inpatient-suspect home 10/31.  Antimicrobial agents  See below  Anti-infectives    Start     Dose/Rate Route Frequency Ordered Stop   06/01/15 0200  ceFAZolin (ANCEF) IVPB 2 g/50 mL premix     2 g 100 mL/hr over 30 Minutes Intravenous  Once 05/31/15 2003 06/01/15 0239   05/31/15 0815  ceFAZolin (ANCEF) IVPB 2 g/50 mL premix     2 g 100 mL/hr over 30 Minutes Intravenous  Once 05/31/15 0807 05/31/15 0837      DVT Prophylaxis: Prophylactic Lovenox -dose per pharmacy  Code Status: Full code   Family Communication None at bedside  Procedures: 10/29>>Open treatment of proximal end of femur, neck with internal fixation.  CONSULTS:  nephrology and orthopedic surgery  Time spent 30 minutes-Greater than 50% of this time was spent in counseling, explanation of diagnosis, planning of further management, and coordination of care.  MEDICATIONS: Scheduled Meds: . acetaminophen  1,000 mg Oral Q8H  . allopurinol  150 mg Oral Daily  . cloNIDine  0.3 mg Oral TID  . [START ON 06/07/2015] darbepoetin (ARANESP) injection - DIALYSIS  40 mcg Intravenous Q Sat-HD  . dextromethorphan-guaiFENesin  1 tablet Oral BID  . doxercalciferol  2.5 mcg Oral Once per day on Mon Wed Fri  . enoxaparin (LOVENOX) injection  30 mg Subcutaneous Q24H  . hydrALAZINE  100 mg Oral TID  . labetalol  300 mg Oral BID  .  levothyroxine  50 mcg Oral QAC breakfast  . metoCLOPramide  5 mg Oral TID AC  . multivitamin  1 tablet Oral QHS  . NIFEdipine  90 mg Oral Daily  . pantoprazole  40 mg Oral Daily  . polyethylene  glycol  17 g Oral BID  . sevelamer carbonate  2,400 mg Oral TID WC   Continuous Infusions:   PRN Meds:.acetaminophen **OR** acetaminophen, albuterol, diazepam, meclizine, morphine injection, ondansetron **OR** ondansetron (ZOFRAN) IV, oxyCODONE    PHYSICAL EXAM: Vital signs in last 24 hours: Filed Vitals:   06/03/15 1030 06/03/15 1100 06/03/15 1120 06/03/15 1217  BP: 185/83 193/82 200/84 208/78  Pulse: 67 65 66 76  Temp:    98.2 F (36.8 C)  TempSrc:    Oral  Resp:   20 18  Height:      Weight:   63.4 kg (139 lb 12.4 oz)   SpO2:   99% 100%    Weight change:  Filed Weights   06/01/15 0100 06/03/15 0809 06/03/15 1120  Weight: 57.8 kg (127 lb 6.8 oz) 64.3 kg (141 lb 12.1 oz) 63.4 kg (139 lb 12.4 oz)   Body mass index is 22.57 kg/(m^2).   Gen Exam: Awake and alert with clear speech.   Neck: Supple, No JVD.  Chest: B/L Clear.   CVS: S1 S2 Regular, no murmurs.  Abdomen: soft, BS +, non tender, non distended.  Extremities: no edema, lower extremities warm to touch Neurologic: Non Focal.   Skin: No Rash.   Wounds: N/A.    Intake/Output from previous day:  Intake/Output Summary (Last 24 hours) at 06/03/15 1335 Last data filed at 06/03/15 1120  Gross per 24 hour  Intake    600 ml  Output   1416 ml  Net   -816 ml     LAB RESULTS: CBC  Recent Labs Lab 05/30/15 2145 05/31/15 0701 06/01/15 0725 06/02/15 0335 06/03/15 0535  WBC 6.8  --  5.4 6.2 4.3  HGB 10.5* 13.6 11.2* 10.8* 10.8*  HCT 34.6* 40.0 36.5 34.1* 33.9*  PLT 150  --  156 168 160  MCV 76.9*  --  76.4* 74.8* 73.4*  MCH 23.3*  --  23.4* 23.7* 23.4*  MCHC 30.3  --  30.7 31.7 31.9  RDW 22.2*  --  22.3* 21.8* 21.3*  LYMPHSABS 1.1  --   --   --   --   MONOABS 0.6  --   --   --   --   EOSABS 0.1  --   --   --   --   BASOSABS 0.0  --   --   --   --     Chemistries   Recent Labs Lab 05/30/15 2145 05/31/15 0701 06/01/15 0725 06/02/15 0335 06/03/15 0830  NA 135 135 135 133* 125*  K 4.7 4.9 4.1 4.7  5.1  CL 102  --  97* 95* 89*  CO2 23  --  29 25 22   GLUCOSE 96 79 86 78 128*  BUN 32*  --  15 29* 49*  CREATININE 6.73*  --  4.42* 6.06* 8.01*  CALCIUM 9.2  --  9.1 9.0 8.6*    CBG:  Recent Labs Lab 05/31/15 1002  GLUCAP 80    GFR Estimated Creatinine Clearance: 6.5 mL/min (by C-G formula based on Cr of 8.01).  Coagulation profile  Recent Labs Lab 05/30/15 2145  INR 1.23    Cardiac Enzymes No results for input(s): CKMB, TROPONINI, MYOGLOBIN in the last  168 hours.  Invalid input(s): CK  Invalid input(s): POCBNP No results for input(s): DDIMER in the last 72 hours. No results for input(s): HGBA1C in the last 72 hours. No results for input(s): CHOL, HDL, LDLCALC, TRIG, CHOLHDL, LDLDIRECT in the last 72 hours. No results for input(s): TSH, T4TOTAL, T3FREE, THYROIDAB in the last 72 hours.  Invalid input(s): FREET3 No results for input(s): VITAMINB12, FOLATE, FERRITIN, TIBC, IRON, RETICCTPCT in the last 72 hours. No results for input(s): LIPASE, AMYLASE in the last 72 hours.  Urine Studies No results for input(s): UHGB, CRYS in the last 72 hours.  Invalid input(s): UACOL, UAPR, USPG, UPH, UTP, UGL, UKET, UBIL, UNIT, UROB, ULEU, UEPI, UWBC, URBC, UBAC, CAST, UCOM, BILUA  MICROBIOLOGY: Recent Results (from the past 240 hour(s))  Surgical pcr screen     Status: None   Collection Time: 05/31/15  6:35 AM  Result Value Ref Range Status   MRSA, PCR NEGATIVE NEGATIVE Final   Staphylococcus aureus NEGATIVE NEGATIVE Final    Comment:        The Xpert SA Assay (FDA approved for NASAL specimens in patients over 62 years of age), is one component of a comprehensive surveillance program.  Test performance has been validated by [facility] for patients greater than or equal to 1 year old. It is not intended to diagnose infection nor to guide or monitor treatment.     RADIOLOGY STUDIES/RESULTS: Dg Chest 1 View  05/30/2015  CLINICAL DATA:  [place] EXAM: CHEST 1 VIEW  COMPARISON:  April 14, 2015 FINDINGS: There is no edema or consolidation. Heart is enlarged with pulmonary vascularity within normal limits. No pneumothorax. No adenopathy. No bone lesions. IMPRESSION: Cardiomegaly.  No edema or consolidation. Electronically Signed   By: Lowella Grip III M.D.   On: 05/30/2015 21:38   Dg Bone Density  05/09/2015  EXAM: DUAL X-RAY ABSORPTIOMETRY (DXA) FOR BONE MINERAL DENSITY IMPRESSION: Ordering Physician:  Dr. Redmond School, Your patient Sonya [name] completed a BMD test on 05/09/2015 using the [place] (software version: 14.10) manufactured by [employer]. The following summarizes the results of our evaluation. PATIENT BIOGRAPHICAL: Name: Sonya [name], Sonya [name] Patient ID: [id number] Birth Date: Feb 08, 1951 Height: 66.0 in. Gender: Female Exam Date: 05/09/2015 Weight: 130.0 lbs. Indications: Bilateral Oophrectomy, Low Calcium Intake, Post Menopausal, Renal Failure Fractures: Treatments: Vitamin D DENSITOMETRY RESULTS: Site          Region     Measured Date Measured Age WHO Classification Young Adult T-score BMD         %Change vs. Previous Significant Change (*) DualFemur Neck Right 05/09/2015 65.9 Osteoporosis -2.5 0.693 g/cm2 Right Forearm Radius 33% 05/09/2015 65.9 Normal -0.2 0.700 g/cm2 ASSESSMENT: BMD as determined from Femur Neck Right is 0.693 g/cm2 with a T-Score of -2.5. This patient is considered osteoporotic according to [name]) criteria. (Patient does not meet criteria for FRAX assessment.) World Health Organization [facility]) criteria for post-menopausal, Caucasian Women: Normal:       T-score at or above -1 SD Osteopenia:   T-score between -1 and -2.5 SD Osteoporosis: T-score at or below -2.5 SD RECOMMENDATIONS: [place] recommends that FDA-approved medial therapies be considered in postmenopausal women and men age 25 or older with a: 1. Hip or vertebral (clinical or morphometric) fracture.  2. T-Score of < -2.5 at the spine or hip. 3. Ten-year fracture probability by FRAX of 3% or greater for hip fracture or 20% or greater for major osteoporotic fracture. All  treatment decisions require clinical judgment and consideration of individual patient factors, including patient preferences, co-morbidities, previous drug use, risk factors not captured in the FRAX model (e.g. falls, vitamin D deficiency, increased bone turnover, interval significant decline in bone density) and possible under-or over-estimation of fracture risk by FRAX. All patients should ensure an adequate intake of dietary calcium (1200 mg/d) and vitamin D (800 IU daily) unless contraindicated. FOLLOW-UP: People with diagnosed cases of osteoporosis or osteopenia should be regularly tested for bone mineral density. For patients eligible for Medicare, routine testing is allowed once every 2 years. Testing frequency can be increased for patients who have rapidly progressing disease, or for those who are receiving medical therapy to restore bone mass. I have reviewed this report, and agree with the above findings. [facility] Electronically Signed   By: Lowella Grip III M.D.   On: 05/09/2015 11:43   Dg C-arm 1-60 Min  05/31/2015  CLINICAL DATA:  Pinning of left femoral neck fracture. EXAM: DG C-ARM 61-120 MIN; LEFT FEMUR 2 VIEWS COMPARISON:  None. FINDINGS: Intraoperative images were obtained demonstrating placement of a total of 3 separate cannulated pins across the femoral neck which appear in good position. There is no evidence of significant displacement or angulation across the left femoral neck fracture. IMPRESSION: Near anatomic alignment with good positioning of 3 cannulated pins across the left femoral neck fracture. Electronically Signed   By: Aletta Edouard M.D.   On: 05/31/2015 09:39   Mm Screening Breast Tomo Bilateral  05/12/2015  CLINICAL DATA:  Screening. EXAM: DIGITAL SCREENING BILATERAL MAMMOGRAM  WITH 3D TOMO WITH CAD COMPARISON:  Previous exam(s). ACR Breast Density Category c: The breast tissue is heterogeneously dense, which may obscure small masses. FINDINGS: There are no findings suspicious for malignancy. Images were processed with CAD. IMPRESSION: No mammographic evidence of malignancy. A result letter of this screening mammogram will be mailed directly to the patient. RECOMMENDATION: Screening mammogram in one year. (Code:SM-B-01Y) BI-RADS CATEGORY  1: Negative. Electronically Signed   By: Fidela Salisbury M.D.   On: 05/12/2015 17:39   Dg Hip Unilat With Pelvis 2-3 Views Left  05/30/2015  CLINICAL DATA:  Pain following fall EXAM: DG HIP (WITH OR WITHOUT PELVIS) 2-3V LEFT COMPARISON:  None. FINDINGS: Frontal pelvis as well as frontal and lateral left hip images were obtained. There is a fracture of the left femoral neck which extends to the subcapital region laterally with slight impaction along the lateral aspect of the subcapital femoral neck region on the left. No other fracture is apparent. No dislocation. There is mild symmetric narrowing of both hip joints. There are scattered foci of arterial vascular calcification bilaterally. IMPRESSION: Nondisplaced fracture of the left femoral neck with mild impaction at the subcapital femoral neck region with laterally on the left. No other fracture. No dislocation. There is mild symmetric narrowing of both hip joints. Electronically Signed   By: Lowella Grip III M.D.   On: 05/30/2015 20:44   Dg Femur Min 2 Views Left  05/31/2015  CLINICAL DATA:  Pinning of left femoral neck fracture. EXAM: DG C-ARM 61-120 MIN; LEFT FEMUR 2 VIEWS COMPARISON:  None. FINDINGS: Intraoperative images were obtained demonstrating placement of a total of 3 separate cannulated pins across the femoral neck which appear in good position. There is no evidence of significant displacement or angulation across the left femoral neck fracture. IMPRESSION: Near anatomic  alignment with good positioning of 3 cannulated pins across the left femoral neck fracture. Electronically Signed  By: Aletta Edouard M.D.   On: 05/31/2015 09:39    Thurnell Lose, MD  [facility] Pager:[phone]  If 7PM-7AM, please contact night-coverage [email or web address] Password [email or web address] 06/03/2015, 1:35 PM   LOS: 4 days

## 2015-06-04 DIAGNOSIS — E559 Vitamin D deficiency, unspecified: Secondary | ICD-10-CM | POA: Diagnosis not present

## 2015-06-04 DIAGNOSIS — N2581 Secondary hyperparathyroidism of renal origin: Secondary | ICD-10-CM | POA: Diagnosis not present

## 2015-06-04 DIAGNOSIS — Z833 Family history of diabetes mellitus: Secondary | ICD-10-CM | POA: Diagnosis not present

## 2015-06-04 DIAGNOSIS — M109 Gout, unspecified: Secondary | ICD-10-CM | POA: Diagnosis not present

## 2015-06-04 DIAGNOSIS — R1310 Dysphagia, unspecified: Secondary | ICD-10-CM | POA: Diagnosis not present

## 2015-06-04 DIAGNOSIS — Z888 Allergy status to other drugs, medicaments and biological substances status: Secondary | ICD-10-CM | POA: Diagnosis not present

## 2015-06-04 DIAGNOSIS — Z90710 Acquired absence of both cervix and uterus: Secondary | ICD-10-CM | POA: Diagnosis not present

## 2015-06-04 DIAGNOSIS — M810 Age-related osteoporosis without current pathological fracture: Secondary | ICD-10-CM | POA: Diagnosis not present

## 2015-06-04 DIAGNOSIS — I10 Essential (primary) hypertension: Secondary | ICD-10-CM | POA: Diagnosis not present

## 2015-06-04 DIAGNOSIS — M25552 Pain in left hip: Secondary | ICD-10-CM | POA: Diagnosis not present

## 2015-06-04 DIAGNOSIS — N189 Chronic kidney disease, unspecified: Secondary | ICD-10-CM | POA: Diagnosis not present

## 2015-06-04 DIAGNOSIS — R51 Headache: Secondary | ICD-10-CM | POA: Diagnosis not present

## 2015-06-04 DIAGNOSIS — Z8673 Personal history of transient ischemic attack (TIA), and cerebral infarction without residual deficits: Secondary | ICD-10-CM | POA: Diagnosis not present

## 2015-06-04 DIAGNOSIS — I517 Cardiomegaly: Secondary | ICD-10-CM | POA: Diagnosis not present

## 2015-06-04 DIAGNOSIS — D631 Anemia in chronic kidney disease: Secondary | ICD-10-CM | POA: Diagnosis not present

## 2015-06-04 DIAGNOSIS — K219 Gastro-esophageal reflux disease without esophagitis: Secondary | ICD-10-CM | POA: Diagnosis not present

## 2015-06-04 DIAGNOSIS — E43 Unspecified severe protein-calorie malnutrition: Secondary | ICD-10-CM | POA: Diagnosis not present

## 2015-06-04 DIAGNOSIS — M6281 Muscle weakness (generalized): Secondary | ICD-10-CM | POA: Diagnosis not present

## 2015-06-04 DIAGNOSIS — G9340 Encephalopathy, unspecified: Secondary | ICD-10-CM | POA: Diagnosis not present

## 2015-06-04 DIAGNOSIS — N186 End stage renal disease: Secondary | ICD-10-CM | POA: Diagnosis not present

## 2015-06-04 DIAGNOSIS — R112 Nausea with vomiting, unspecified: Secondary | ICD-10-CM | POA: Diagnosis not present

## 2015-06-04 DIAGNOSIS — H532 Diplopia: Secondary | ICD-10-CM | POA: Diagnosis not present

## 2015-06-04 DIAGNOSIS — R030 Elevated blood-pressure reading, without diagnosis of hypertension: Secondary | ICD-10-CM | POA: Diagnosis not present

## 2015-06-04 DIAGNOSIS — K5900 Constipation, unspecified: Secondary | ICD-10-CM | POA: Diagnosis not present

## 2015-06-04 DIAGNOSIS — N390 Urinary tract infection, site not specified: Secondary | ICD-10-CM | POA: Diagnosis not present

## 2015-06-04 DIAGNOSIS — G441 Vascular headache, not elsewhere classified: Secondary | ICD-10-CM | POA: Diagnosis not present

## 2015-06-04 DIAGNOSIS — Z8542 Personal history of malignant neoplasm of other parts of uterus: Secondary | ICD-10-CM | POA: Diagnosis not present

## 2015-06-04 DIAGNOSIS — K2970 Gastritis, unspecified, without bleeding: Secondary | ICD-10-CM | POA: Diagnosis not present

## 2015-06-04 DIAGNOSIS — I161 Hypertensive emergency: Secondary | ICD-10-CM | POA: Diagnosis not present

## 2015-06-04 DIAGNOSIS — I951 Orthostatic hypotension: Secondary | ICD-10-CM | POA: Diagnosis not present

## 2015-06-04 DIAGNOSIS — R1110 Vomiting, unspecified: Secondary | ICD-10-CM | POA: Diagnosis not present

## 2015-06-04 DIAGNOSIS — Z9889 Other specified postprocedural states: Secondary | ICD-10-CM | POA: Diagnosis not present

## 2015-06-04 DIAGNOSIS — Z992 Dependence on renal dialysis: Secondary | ICD-10-CM | POA: Diagnosis not present

## 2015-06-04 DIAGNOSIS — D509 Iron deficiency anemia, unspecified: Secondary | ICD-10-CM | POA: Diagnosis not present

## 2015-06-04 DIAGNOSIS — Z809 Family history of malignant neoplasm, unspecified: Secondary | ICD-10-CM | POA: Diagnosis not present

## 2015-06-04 DIAGNOSIS — K5792 Diverticulitis of intestine, part unspecified, without perforation or abscess without bleeding: Secondary | ICD-10-CM | POA: Diagnosis not present

## 2015-06-04 DIAGNOSIS — E039 Hypothyroidism, unspecified: Secondary | ICD-10-CM | POA: Diagnosis not present

## 2015-06-04 DIAGNOSIS — Z9049 Acquired absence of other specified parts of digestive tract: Secondary | ICD-10-CM | POA: Diagnosis not present

## 2015-06-04 DIAGNOSIS — R42 Dizziness and giddiness: Secondary | ICD-10-CM | POA: Diagnosis not present

## 2015-06-04 DIAGNOSIS — I739 Peripheral vascular disease, unspecified: Secondary | ICD-10-CM | POA: Diagnosis not present

## 2015-06-04 DIAGNOSIS — R262 Difficulty in walking, not elsewhere classified: Secondary | ICD-10-CM | POA: Diagnosis not present

## 2015-06-04 DIAGNOSIS — S72002D Fracture of unspecified part of neck of left femur, subsequent encounter for closed fracture with routine healing: Secondary | ICD-10-CM | POA: Diagnosis not present

## 2015-06-04 DIAGNOSIS — Z8249 Family history of ischemic heart disease and other diseases of the circulatory system: Secondary | ICD-10-CM | POA: Diagnosis not present

## 2015-06-04 DIAGNOSIS — I160 Hypertensive urgency: Secondary | ICD-10-CM | POA: Diagnosis not present

## 2015-06-04 DIAGNOSIS — R0989 Other specified symptoms and signs involving the circulatory and respiratory systems: Secondary | ICD-10-CM | POA: Diagnosis not present

## 2015-06-04 DIAGNOSIS — S72002A Fracture of unspecified part of neck of left femur, initial encounter for closed fracture: Secondary | ICD-10-CM | POA: Diagnosis not present

## 2015-06-04 DIAGNOSIS — I120 Hypertensive chronic kidney disease with stage 5 chronic kidney disease or end stage renal disease: Secondary | ICD-10-CM | POA: Diagnosis not present

## 2015-06-04 DIAGNOSIS — I619 Nontraumatic intracerebral hemorrhage, unspecified: Secondary | ICD-10-CM | POA: Diagnosis not present

## 2015-06-04 MED ORDER — HYDRALAZINE HCL 50 MG PO TABS
100.0000 mg | ORAL_TABLET | Freq: Four times a day (QID) | ORAL | Status: DC
  Administered 2015-06-04: 100 mg via ORAL
  Filled 2015-06-04: qty 2

## 2015-06-04 MED ORDER — HYDRALAZINE HCL 100 MG PO TABS: 100.0000 mg | ORAL_TABLET | Freq: Four times a day (QID) | ORAL | Status: DC

## 2015-06-04 NOTE — Discharge Planning (Signed)
Patient to be discharged to [facility] at [place]. Patient and patient's husband updated at bedside.  Facility: [facility] RN report number: [phone] Transportation: EMS  Lubertha Sayres, [place] [phone]) and Surgical [phone])

## 2015-06-04 NOTE — Clinical Social Work Note (Signed)
Clinical Social Work Assessment  Patient Details  Name: Sonya [name] MRN: [id number] Date of Birth: May 07, 1951  Date of referral:  06/04/15               Reason for consult:  Facility Placement, Discharge Planning                Permission sought to share information with:  Chartered certified accountant granted to share information::  Yes, Verbal Permission Granted  Name::        Agency::  [facility]  Relationship::     Contact Information:     Housing/Transportation Living arrangements for the past 2 months:  Single Family Home Source of Information:  Patient Patient Interpreter Needed:  None Criminal Activity/Legal Involvement Pertinent to Current Situation/Hospitalization:  No - Comment as needed Significant Relationships:  Spouse Lives with:  Spouse Do you feel safe going back to the place where you live?  No (High fall risk.) Need for family participation in patient care:  No (Coment) (Patient's husband active in patient's care.)  Care giving concerns:  Patient expressed no concerns at this time.   Social Worker assessment / plan:  CSW received referral for possible SNF placement at time of discharge. CSW met with patient who informed CSW patient would prefer to discharge to [facility] once medically stable for discharge. CSW to continue to follow and assist with discharge planning needs.  Employment status:  Retired Forensic scientist:  Medicare PT Recommendations:  [place] / Referral to community resources:  [place]  Patient/Family's Response to care:  Patient understanding and agreeable to CSW plan of care.  Patient/Family's Understanding of and Emotional Response to Diagnosis, Current Treatment, and Prognosis:  Patient understanding and agreeable to CSW plan of care.  Emotional Assessment Appearance:  Appears stated age Attitude/Demeanor/Rapport:  Complaining Affect (typically  observed):  Pleasant, Quiet Orientation:  Oriented to Self, Oriented to Place, Oriented to  Time, Oriented to Situation Alcohol / Substance use:  Not Applicable Psych involvement (Current and /or in the community):  No (Comment) (Not appropriate on this admission.)  Discharge Needs  Concerns to be addressed:  No discharge needs identified Readmission within the last 30 days:  No Current discharge risk:  None Barriers to Discharge:  No Barriers Identified   Caroline Sauger, LCSW 06/04/2015, 10:55 AM [phone]

## 2015-06-04 NOTE — Clinical Social Work Placement (Signed)
   CLINICAL SOCIAL WORK PLACEMENT  NOTE  Date:  06/04/2015  Patient Details  Name: Sonya [name] MRN: [id number] Date of Birth: January 08, 1951  Clinical Social Work is seeking post-discharge placement for this patient at the [place] level of care (*CSW will initial, date and re-position this form in  chart as items are completed):  Yes   Patient/family provided with [facility] Work Department's list of facilities offering this level of care within the geographic area requested by the patient (or if unable, by the patient's family).  Yes   Patient/family informed of their freedom to choose among providers that offer the needed level of care, that participate in Medicare, Medicaid or managed care program needed by the patient, have an available bed and are willing to accept the patient.  Yes   Patient/family informed of [facility]'s ownership interest in [facility] and [facility], as well as of the fact that they are under no obligation to receive care at these facilities.  PASRR submitted to EDS on       PASRR number received on       Existing PASRR number confirmed on 06/03/15     FL2 transmitted to all facilities in geographic area requested by pt/family on 06/03/15     FL2 transmitted to all facilities within larger geographic area on       Patient informed that his/her managed care company has contracts with or will negotiate with certain facilities, including the following:        Yes   Patient/family informed of bed offers received.  [facility] at [facility]     Physician recommends and patient chooses bed at      Patient to be transferred to [place] [facility] [place] on 06/04/15.  Patient to be transferred to facility by EMS     Patient family notified on 06/04/15 of transfer.  Name of family member notified:  Patient and husband at bedside.     PHYSICIAN       Additional Comment:     _______________________________________________ Caroline Sauger, LCSW 06/04/2015, 10:57 AM

## 2015-06-05 ENCOUNTER — Encounter (HOSPITAL_COMMUNITY): Payer: Self-pay | Admitting: Orthopaedic Surgery

## 2015-06-05 DIAGNOSIS — K5900 Constipation, unspecified: Secondary | ICD-10-CM | POA: Diagnosis not present

## 2015-06-05 DIAGNOSIS — M810 Age-related osteoporosis without current pathological fracture: Secondary | ICD-10-CM | POA: Diagnosis not present

## 2015-06-05 DIAGNOSIS — Z9889 Other specified postprocedural states: Secondary | ICD-10-CM | POA: Diagnosis not present

## 2015-06-05 DIAGNOSIS — M25552 Pain in left hip: Secondary | ICD-10-CM | POA: Diagnosis not present

## 2015-06-06 ENCOUNTER — Telehealth: Payer: Self-pay | Admitting: Gastroenterology

## 2015-06-06 DIAGNOSIS — D631 Anemia in chronic kidney disease: Secondary | ICD-10-CM | POA: Diagnosis not present

## 2015-06-06 DIAGNOSIS — N186 End stage renal disease: Secondary | ICD-10-CM | POA: Diagnosis not present

## 2015-06-06 DIAGNOSIS — N2581 Secondary hyperparathyroidism of renal origin: Secondary | ICD-10-CM | POA: Diagnosis not present

## 2015-06-06 DIAGNOSIS — Z992 Dependence on renal dialysis: Secondary | ICD-10-CM | POA: Diagnosis not present

## 2015-06-06 DIAGNOSIS — Z9889 Other specified postprocedural states: Secondary | ICD-10-CM | POA: Diagnosis not present

## 2015-06-06 DIAGNOSIS — E559 Vitamin D deficiency, unspecified: Secondary | ICD-10-CM | POA: Diagnosis not present

## 2015-06-06 DIAGNOSIS — D509 Iron deficiency anemia, unspecified: Secondary | ICD-10-CM | POA: Diagnosis not present

## 2015-06-06 DIAGNOSIS — M810 Age-related osteoporosis without current pathological fracture: Secondary | ICD-10-CM | POA: Diagnosis not present

## 2015-06-06 NOTE — Telephone Encounter (Signed)
Reminder in epic °

## 2015-06-06 NOTE — Telephone Encounter (Signed)
Please call pt. HER stomach Bx shows mild gastritis. HER NAUSEA AND VOMITING IS MOST LIKELY DUE TO MEDS AND REFLUX.  AVOID FOOD THAT TRIGGERS REFLUX.   CONTINUE REGLAN 30 MINS BEFORE BREAKFAST AND LUNCH AND AT BEDTIME. SIDE EFFECTS OF REGLAN INCLUDE AGITATION, DRAINAGE FROM BREAST, INVOLUNTARY MOVEMENT OF HEAD, NECK AND TORSO, OR CHANGES IN VISION. PLEASE CALL IF YOU ARE HAVING ANY PROBLEMS.  2. USE ONDANSETRON(ZOFRAN) AS NEEDED.  3. CONTINUE OMEPRAZOLE. TAKE 30 MINUTES PRIOR TO YOUR FIRST MEAL. PLEASE CALL IN ONE MONTH IF SYMPTOMS ARE NOT IMPROVED.  FOLLOW UP IN FEB 2017.

## 2015-06-07 DIAGNOSIS — N2581 Secondary hyperparathyroidism of renal origin: Secondary | ICD-10-CM | POA: Diagnosis not present

## 2015-06-07 DIAGNOSIS — N186 End stage renal disease: Secondary | ICD-10-CM | POA: Diagnosis not present

## 2015-06-07 DIAGNOSIS — Z992 Dependence on renal dialysis: Secondary | ICD-10-CM | POA: Diagnosis not present

## 2015-06-07 DIAGNOSIS — D509 Iron deficiency anemia, unspecified: Secondary | ICD-10-CM | POA: Diagnosis not present

## 2015-06-07 DIAGNOSIS — D631 Anemia in chronic kidney disease: Secondary | ICD-10-CM | POA: Diagnosis not present

## 2015-06-09 NOTE — Telephone Encounter (Signed)
Called. Many rings and no answer.  

## 2015-06-09 NOTE — Telephone Encounter (Signed)
Routing to DS 

## 2015-06-10 DIAGNOSIS — N2581 Secondary hyperparathyroidism of renal origin: Secondary | ICD-10-CM | POA: Diagnosis not present

## 2015-06-10 DIAGNOSIS — D509 Iron deficiency anemia, unspecified: Secondary | ICD-10-CM | POA: Diagnosis not present

## 2015-06-10 DIAGNOSIS — D631 Anemia in chronic kidney disease: Secondary | ICD-10-CM | POA: Diagnosis not present

## 2015-06-10 DIAGNOSIS — N186 End stage renal disease: Secondary | ICD-10-CM | POA: Diagnosis not present

## 2015-06-10 DIAGNOSIS — Z992 Dependence on renal dialysis: Secondary | ICD-10-CM | POA: Diagnosis not present

## 2015-06-10 NOTE — Telephone Encounter (Signed)
Tried to call pt. Many rings and no answer. Mailing a letter to call.  

## 2015-06-11 DIAGNOSIS — I10 Essential (primary) hypertension: Secondary | ICD-10-CM | POA: Diagnosis not present

## 2015-06-11 DIAGNOSIS — R0989 Other specified symptoms and signs involving the circulatory and respiratory systems: Secondary | ICD-10-CM | POA: Diagnosis not present

## 2015-06-12 ENCOUNTER — Observation Stay (HOSPITAL_COMMUNITY)
Admission: EM | Admit: 2015-06-12 | Discharge: 2015-06-16 | Disposition: A | Discharge status: Skilled Nursing Facility | Payer: Medicare Other | Attending: Internal Medicine | Admitting: Internal Medicine

## 2015-06-12 ENCOUNTER — Emergency Department (HOSPITAL_COMMUNITY): Payer: Medicare Other

## 2015-06-12 ENCOUNTER — Encounter (HOSPITAL_COMMUNITY): Payer: Self-pay | Admitting: *Deleted

## 2015-06-12 DIAGNOSIS — R42 Dizziness and giddiness: Secondary | ICD-10-CM | POA: Insufficient documentation

## 2015-06-12 DIAGNOSIS — Z9049 Acquired absence of other specified parts of digestive tract: Secondary | ICD-10-CM | POA: Insufficient documentation

## 2015-06-12 DIAGNOSIS — E039 Hypothyroidism, unspecified: Secondary | ICD-10-CM | POA: Diagnosis present

## 2015-06-12 DIAGNOSIS — I517 Cardiomegaly: Secondary | ICD-10-CM | POA: Insufficient documentation

## 2015-06-12 DIAGNOSIS — N186 End stage renal disease: Secondary | ICD-10-CM | POA: Diagnosis not present

## 2015-06-12 DIAGNOSIS — Z992 Dependence on renal dialysis: Secondary | ICD-10-CM | POA: Insufficient documentation

## 2015-06-12 DIAGNOSIS — Z833 Family history of diabetes mellitus: Secondary | ICD-10-CM | POA: Insufficient documentation

## 2015-06-12 DIAGNOSIS — Z90710 Acquired absence of both cervix and uterus: Secondary | ICD-10-CM | POA: Insufficient documentation

## 2015-06-12 DIAGNOSIS — R51 Headache: Secondary | ICD-10-CM | POA: Insufficient documentation

## 2015-06-12 DIAGNOSIS — N2581 Secondary hyperparathyroidism of renal origin: Secondary | ICD-10-CM | POA: Diagnosis not present

## 2015-06-12 DIAGNOSIS — D631 Anemia in chronic kidney disease: Secondary | ICD-10-CM | POA: Diagnosis not present

## 2015-06-12 DIAGNOSIS — R0989 Other specified symptoms and signs involving the circulatory and respiratory systems: Secondary | ICD-10-CM | POA: Diagnosis not present

## 2015-06-12 DIAGNOSIS — I10 Essential (primary) hypertension: Secondary | ICD-10-CM | POA: Diagnosis present

## 2015-06-12 DIAGNOSIS — I120 Hypertensive chronic kidney disease with stage 5 chronic kidney disease or end stage renal disease: Secondary | ICD-10-CM | POA: Diagnosis not present

## 2015-06-12 DIAGNOSIS — I160 Hypertensive urgency: Secondary | ICD-10-CM

## 2015-06-12 DIAGNOSIS — Z8673 Personal history of transient ischemic attack (TIA), and cerebral infarction without residual deficits: Secondary | ICD-10-CM | POA: Insufficient documentation

## 2015-06-12 DIAGNOSIS — Z8542 Personal history of malignant neoplasm of other parts of uterus: Secondary | ICD-10-CM | POA: Insufficient documentation

## 2015-06-12 DIAGNOSIS — I739 Peripheral vascular disease, unspecified: Secondary | ICD-10-CM | POA: Insufficient documentation

## 2015-06-12 DIAGNOSIS — N189 Chronic kidney disease, unspecified: Secondary | ICD-10-CM

## 2015-06-12 DIAGNOSIS — S72002A Fracture of unspecified part of neck of left femur, initial encounter for closed fracture: Secondary | ICD-10-CM | POA: Diagnosis present

## 2015-06-12 DIAGNOSIS — Z809 Family history of malignant neoplasm, unspecified: Secondary | ICD-10-CM | POA: Insufficient documentation

## 2015-06-12 DIAGNOSIS — Z9889 Other specified postprocedural states: Secondary | ICD-10-CM | POA: Insufficient documentation

## 2015-06-12 DIAGNOSIS — K219 Gastro-esophageal reflux disease without esophagitis: Secondary | ICD-10-CM | POA: Insufficient documentation

## 2015-06-12 DIAGNOSIS — I161 Hypertensive emergency: Secondary | ICD-10-CM | POA: Diagnosis not present

## 2015-06-12 DIAGNOSIS — Z888 Allergy status to other drugs, medicaments and biological substances status: Secondary | ICD-10-CM | POA: Insufficient documentation

## 2015-06-12 DIAGNOSIS — D509 Iron deficiency anemia, unspecified: Secondary | ICD-10-CM | POA: Insufficient documentation

## 2015-06-12 DIAGNOSIS — R519 Headache, unspecified: Secondary | ICD-10-CM | POA: Diagnosis present

## 2015-06-12 DIAGNOSIS — Z8249 Family history of ischemic heart disease and other diseases of the circulatory system: Secondary | ICD-10-CM | POA: Insufficient documentation

## 2015-06-12 HISTORY — DX: Hypertensive urgency: I16.0

## 2015-06-12 HISTORY — DX: Spina bifida, unspecified: Q05.9

## 2015-06-12 HISTORY — DX: Gastritis, unspecified, without bleeding: K29.70

## 2015-06-12 HISTORY — DX: End stage renal disease: N18.6

## 2015-06-12 HISTORY — DX: Nontraumatic intracerebral hemorrhage, unspecified: I61.9

## 2015-06-12 HISTORY — DX: Esophageal obstruction: K22.2

## 2015-06-12 HISTORY — DX: Gastro-esophageal reflux disease without esophagitis: K21.9

## 2015-06-12 HISTORY — DX: Fracture of unspecified part of neck of left femur, initial encounter for closed fracture: S72.002A

## 2015-06-12 LAB — BASIC METABOLIC PANEL
ANION GAP: 10 (ref 5–15)
BUN: 20 mg/dL (ref 6–20)
CHLORIDE: 98 mmol/L — AB (ref 101–111)
CO2: 27 mmol/L (ref 22–32)
Calcium: 9.3 mg/dL (ref 8.9–10.3)
Creatinine, Ser: 4.25 mg/dL — ABNORMAL HIGH (ref 0.44–1.00)
GFR calc Af Amer: 12 mL/min — ABNORMAL LOW (ref >60)
GFR calc non Af Amer: 10 mL/min — ABNORMAL LOW (ref >60)
GLUCOSE: 92 mg/dL (ref 65–99)
POTASSIUM: 4.5 mmol/L (ref 3.5–5.1)
Sodium: 135 mmol/L (ref 135–145)

## 2015-06-12 LAB — CBC WITH DIFFERENTIAL/PLATELET
Basophils Absolute: 0.1 10*3/uL (ref 0.0–0.1)
Basophils Relative: 1 %
EOS ABS: 0.1 10*3/uL (ref 0.0–0.7)
Eosinophils Relative: 1 %
HEMATOCRIT: 32.8 % — AB (ref 36.0–46.0)
HEMOGLOBIN: 10.1 g/dL — AB (ref 12.0–15.0)
LYMPHS ABS: 1.9 10*3/uL (ref 0.7–4.0)
LYMPHS PCT: 27 %
MCH: 23.2 pg — AB (ref 26.0–34.0)
MCHC: 30.8 g/dL (ref 30.0–36.0)
MCV: 75.4 fL — AB (ref 78.0–100.0)
MONOS PCT: 11 %
Monocytes Absolute: 0.8 10*3/uL (ref 0.1–1.0)
NEUTROS PCT: 60 %
Neutro Abs: 4.3 10*3/uL (ref 1.7–7.7)
Platelets: 303 10*3/uL (ref 150–400)
RBC: 4.35 MIL/uL (ref 3.87–5.11)
RDW: 21.2 % — ABNORMAL HIGH (ref 11.5–15.5)
WBC: 7.1 10*3/uL (ref 4.0–10.5)

## 2015-06-12 LAB — RAPID STREP SCREEN (MED CTR MEBANE ONLY): STREPTOCOCCUS, GROUP A SCREEN (DIRECT): NEGATIVE

## 2015-06-12 MED ORDER — OXYCODONE-ACETAMINOPHEN 5-325 MG PO TABS
1.0000 | ORAL_TABLET | Freq: Once | ORAL | Status: AC
  Administered 2015-06-12: 1 via ORAL
  Filled 2015-06-12: qty 1

## 2015-06-12 MED ORDER — LABETALOL HCL 5 MG/ML IV SOLN
80.0000 mg | Freq: Once | INTRAVENOUS | Status: AC
  Administered 2015-06-12: 40 mg via INTRAVENOUS
  Filled 2015-06-12: qty 16

## 2015-06-12 MED ORDER — NICARDIPINE HCL IN NACL 20-0.86 MG/200ML-% IV SOLN
3.0000 mg/h | Freq: Once | INTRAVENOUS | Status: AC
  Administered 2015-06-13: 3 mg/h via INTRAVENOUS
  Filled 2015-06-12: qty 200

## 2015-06-12 MED ORDER — LABETALOL HCL 5 MG/ML IV SOLN
40.0000 mg | Freq: Once | INTRAVENOUS | Status: AC
  Administered 2015-06-12: 40 mg via INTRAVENOUS
  Filled 2015-06-12: qty 8

## 2015-06-12 MED ORDER — CLONIDINE HCL 0.2 MG PO TABS
0.2000 mg | ORAL_TABLET | Freq: Once | ORAL | Status: AC
  Administered 2015-06-12: 0.2 mg via ORAL
  Filled 2015-06-12: qty 1

## 2015-06-12 MED ORDER — HYDROMORPHONE HCL 2 MG/ML IJ SOLN
2.0000 mg | Freq: Once | INTRAMUSCULAR | Status: AC
  Administered 2015-06-12: 2 mg via INTRAMUSCULAR
  Filled 2015-06-12: qty 1

## 2015-06-12 NOTE — ED Notes (Addendum)
Patient sent from [facility] d/t HTN. Patient had dialysis today and upon return to [facility] found to have a headache and BP elevated, however patient refuses to take BP meds. BP 243/113 en route.

## 2015-06-12 NOTE — ED Notes (Signed)
Pt and family updated on plan of care,  

## 2015-06-12 NOTE — ED Notes (Signed)
Dr Zammit at bedside,  

## 2015-06-12 NOTE — ED Notes (Signed)
Pt and family updated on plan of care, pt states that she took all her medications, that [facility] lied on her, reports "they lie to me and my family all the time" comfort measures provided,

## 2015-06-12 NOTE — ED Notes (Signed)
Patient ambulated to restroom with assistance.

## 2015-06-12 NOTE — ED Provider Notes (Signed)
CSN: [id number]     Arrival date & time 06/12/15  1523 History   First MD Initiated Contact with Patient 06/12/15 1532     Chief Complaint  Patient presents with  . Hypertension     (Consider location/radiation/quality/duration/timing/severity/associated sxs/prior Treatment) Patient is a 64 y.o. female presenting with hypertension. The history is provided by the patient (Patient states that she has a headache. She was sent over here from the nursing home for her blood pressure being up. According to the nursing home she has not taken her blood pressure medicine today. She takes clonidine and Apresoline and labetalol).  Hypertension The current episode started more than 1 week ago. The problem occurs constantly. The problem has not changed since onset.Pertinent negatives include no chest pain, no abdominal pain and no headaches. Nothing aggravates the symptoms. Nothing relieves the symptoms.    Past Medical History  Diagnosis Date  . HTN (hypertension)   . Hypothyroidism   . Uterine cancer ([place]) 2012  . Anemia   . CKD (chronic kidney disease)   . Dysphagia   . Hyperparathyroidism due to renal insufficiency ([place])   . GERD (gastroesophageal reflux disease)   . Diverticulitis   . Vertical diplopia   . Vertigo    Past Surgical History  Procedure Laterality Date  . Cholecystectomy    . Cesarean section    . Laparoscopic total hysterectomy    . Colonoscopy  2000    TICS, IH  . Colonoscopy  2003 NUR BRBPR D50 V6    [facility]/[place] TICS, IH  . Abdominal hysterectomy    . Nasal septum surgery    . Esophagogastroduodenoscopy (egd) with esophageal dilation N/A 06/08/2013    Dr. Fields:Stricture at the gastroesophageal junction/MILD NON-erosive gastritis (inflammation) was found in the gastric antrum; multiple biopsies/NO SOURCE FOR ANEMIA IDETIFIED-MOST LIKELY DUE TO ANEMIA OF CHRONIC DISEASE  . Colonoscopy N/A 09/04/2013    Procedure: COLONOSCOPY;  Surgeon: Danie Binder, MD;  [facility];  Service: Endoscopy;  Laterality: N/A;  10:30AM-moved to 9:25 [name]  . Appendectomy    . Bascilic vein transposition Right 09/17/2013    Procedure: BRACHIAL VEIN TRANSPOSITION;  Surgeon: Conrad Whitehouse, MD;  Location: [facility];  Service: Vascular;  Laterality: Right;  . Insertion of dialysis catheter Left 09/17/2013    Procedure: INSERTION OF DIALYSIS CATHETER;  Surgeon: Conrad Lula, MD;  Location: [facility];  Service: Vascular;  Laterality: Left;  . Bascilic vein transposition Right 10/29/2013    Procedure: RIGHT SECOND STAGE BRACHIAL VEIN TRANSPOSITION;  Surgeon: Conrad , MD;  Location: [facility];  Service: Vascular;  Laterality: Right;  . Givens capsule study N/A 12/03/2013    Procedure: GIVENS CAPSULE STUDY;  Surgeon: Danie Binder, MD;  [facility];  Service: Endoscopy;  Laterality: N/A;  7:30  . Esophagogastroduodenoscopy N/A 05/23/2015    Procedure: ESOPHAGOGASTRODUODENOSCOPY (EGD);  Surgeon: Danie Binder, MD;  [facility];  Service: Endoscopy;  Laterality: N/A;  1230  . Savory dilation N/A 05/23/2015    Procedure: SAVORY DILATION;  Surgeon: Danie Binder, MD;  [facility];  Service: Endoscopy;  Laterality: N/A;  . Hip pinning,cannulated Left 05/31/2015    Procedure: CANNULATED HIP PINNING;  Surgeon: Leandrew Koyanagi, MD;  Location: [facility];  Service: Orthopedics;  Laterality: Left;   Family History  Problem Relation Age of Onset  . Colon cancer Neg Hx   . Cancer Mother   .  Diabetes Mother   . Hypertension Mother   . Diabetes Father   . Hypertension Father   . Hypertension Sister    Social History  Substance Use Topics  . Smoking status: Never Smoker   . Smokeless tobacco: Never Used  . Alcohol Use: No   OB History    Gravida Para Term Preterm AB TAB SAB Ectopic Multiple Living   1 1 1             Review of Systems  Constitutional: Negative for appetite change and fatigue.  HENT: Negative for congestion, ear discharge and  sinus pressure.   Eyes: Negative for discharge.  Respiratory: Negative for cough.   Cardiovascular: Negative for chest pain.  Gastrointestinal: Negative for abdominal pain and diarrhea.  Genitourinary: Negative for frequency and hematuria.  Musculoskeletal: Negative for back pain.  Skin: Negative for rash.  Neurological: Negative for seizures and headaches.  Psychiatric/Behavioral: Negative for hallucinations.      Allergies  Cephalexin  Home Medications   Prior to Admission medications   Medication Sig Start Date End Date Taking? Authorizing Provider  allopurinol (ZYLOPRIM) 300 MG tablet Take 300 mg by mouth daily.  04/10/15  Yes Historical Provider, MD  cloNIDine (CATAPRES) 0.3 MG tablet Take 1 tablet (0.3 mg total) by mouth 3 (three) times daily. 06/02/15  Yes Thurnell Lose, MD  enoxaparin (LOVENOX) 30 MG/0.3ML injection Inject 0.3 mLs (30 mg total) into the skin daily. 05/31/15  Yes Naiping Ephriam Jenkins, MD  hydrALAZINE (APRESOLINE) 100 MG tablet Take 1 tablet (100 mg total) by mouth every 6 (six) hours. 06/04/15  Yes Thurnell Lose, MD  HYDROcodone-acetaminophen (NORCO) 7.5-325 MG tablet Take 1-2 tablets by mouth every 6 (six) hours as needed for moderate pain. Patient taking differently: Take 1 tablet by mouth every 6 (six) hours as needed for moderate pain.  05/31/15  Yes Leandrew Koyanagi, MD  labetalol (NORMODYNE) 300 MG tablet Take 300 mg by mouth 2 (two) times daily.   Yes Historical Provider, MD  levothyroxine (SYNTHROID, LEVOTHROID) 50 MCG tablet Take 1 tablet (50 mcg total) by mouth daily. 04/17/15  Yes Samuella Cota, MD  lidocaine-prilocaine (EMLA) cream Apply 1 application topically as needed (on Dialysis days Tues,Thurs.,Sat.).   Yes Historical Provider, MD  metoCLOPramide (REGLAN) 5 MG tablet 1 po before breakfast and lunch and at bedtime. Patient taking differently: Take 5 mg by mouth 4 (four) times daily -  before meals and at bedtime.  05/13/15  Yes Danie Binder, MD   NIFEdipine (PROCARDIA XL/ADALAT-CC) 90 MG 24 hr tablet Take 90 mg by mouth daily. 04/10/15  Yes Historical Provider, MD  omeprazole (PRILOSEC) 20 MG capsule Take 1 capsule (20 mg total) by mouth daily. 07/29/14  Yes Orvil Feil, NP  senna-docusate (SENOKOT-S) 8.6-50 MG tablet Take 1 tablet by mouth 2 (two) times daily as needed for mild constipation.   Yes Historical Provider, MD  alendronate (FOSAMAX) 70 MG tablet Take 70 mg by mouth once a week.  05/12/15   Historical Provider, MD  meclizine (ANTIVERT) 25 MG tablet Take 1 tablet by mouth every 6 (six) hours as needed for dizziness. dizziness 11/22/14   Historical Provider, MD   BP 228/93 mmHg  Pulse 88  Temp(Src) 98.9 F (37.2 C) (Oral)  Resp 19  Ht 5\' 6"  (1.676 m)  Wt 139 lb (63.05 kg)  BMI 22.45 kg/m2  SpO2 98% Physical Exam  Constitutional: She is oriented to person, place, and time. She appears well-developed.  HENT:  Head: Normocephalic.  Eyes: Conjunctivae and EOM are normal. No scleral icterus.  Neck: Neck supple. No thyromegaly present.  Cardiovascular: Normal rate and regular rhythm.  Exam reveals no gallop and no friction rub.   No murmur heard. Pulmonary/Chest: No stridor. She has no wheezes. She has no rales. She exhibits no tenderness.  Abdominal: She exhibits no distension. There is no tenderness. There is no rebound.  Musculoskeletal: Normal range of motion. She exhibits no edema.  Lymphadenopathy:    She has no cervical adenopathy.  Neurological: She is oriented to person, place, and time. She exhibits normal muscle tone. Coordination normal.  Skin: No rash noted. No erythema.  Psychiatric: She has a normal mood and affect. Her behavior is normal.    ED Course  Procedures (including critical care time) Labs Review Labs Reviewed  RAPID STREP SCREEN (NOT AT [facility])  CULTURE, GROUP A STREP    Imaging Review Ct Head Wo Contrast  06/12/2015  CLINICAL DATA:  64 year old female with headache and elevated blood  pressure post dialysis. Subsequent encounter. EXAM: CT HEAD WITHOUT CONTRAST TECHNIQUE: Contiguous axial images were obtained from the base of the skull through the vertex without intravenous contrast. COMPARISON:  06/03/2015 head CT.  11/25/2014 brain MR. FINDINGS: No intracranial hemorrhage. Prominent small vessel disease type changes. Remote left thalamic infarct. No CT evidence of large acute infarct. 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 Global atrophy without hydrocephalus. Vascular calcifications. IMPRESSION: No intracranial hemorrhage. Prominent small vessel disease type changes. Remote left thalamic infarct. No CT evidence of large acute infarct. 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 Global atrophy without hydrocephalus. Electronically Signed   By: Genia Del M.D.   On: 06/12/2015 16:25   I have personally reviewed and evaluated these images and lab results as part of my medical decision-making.   EKG Interpretation None      MDM   Final diagnoses:  None    Severe hypertension controlled with clonidine labetalol and Apresoline. Patient is to continue her medicines. CT head negative    [name], MD 06/12/15 2151

## 2015-06-12 NOTE — ED Notes (Addendum)
MD made aware of HTN readings still regardless of po HTN medication administration. PT sleeping at this time with no distress noted. MD stated he would talk with pt and husband again. [facility] nurse given update on pt's status and possible d/c soon back to nursing facility.

## 2015-06-12 NOTE — ED Notes (Signed)
Pt and family updated,  

## 2015-06-12 NOTE — Discharge Instructions (Signed)
Take your bp medicine as prescribed and follow up with your md next week.

## 2015-06-12 NOTE — ED Provider Notes (Signed)
Care assumed from Dr. exam it. Patient with persistent hypertension despite giving her blood pressure medicines in the ED. No response to IV labetalol. CT head is negative.  We'll start nicardipine drip and admitted for hypertensive urgency.  CRITICAL CARE Performed by: Ezequiel Essex Total critical care time: 30 minutes Critical care time was exclusive of separately billable procedures and treating other patients. Critical care was necessary to treat or prevent imminent or life-threatening deterioration. Critical care was time spent personally by me on the following activities: development of treatment plan with patient and/or surrogate as well as nursing, discussions with consultants, evaluation of patient's response to treatment, examination of patient, obtaining history from patient or surrogate, ordering and performing treatments and interventions, ordering and review of laboratory studies, ordering and review of radiographic studies, pulse oximetry and re-evaluation of patient's condition.   Ezequiel Essex, MD 06/13/15 [phone]

## 2015-06-13 ENCOUNTER — Encounter (HOSPITAL_COMMUNITY): Payer: Self-pay | Admitting: Internal Medicine

## 2015-06-13 DIAGNOSIS — R519 Headache, unspecified: Secondary | ICD-10-CM | POA: Diagnosis present

## 2015-06-13 DIAGNOSIS — Z992 Dependence on renal dialysis: Secondary | ICD-10-CM

## 2015-06-13 DIAGNOSIS — D631 Anemia in chronic kidney disease: Secondary | ICD-10-CM | POA: Diagnosis not present

## 2015-06-13 DIAGNOSIS — I10 Essential (primary) hypertension: Secondary | ICD-10-CM | POA: Diagnosis present

## 2015-06-13 DIAGNOSIS — R51 Headache: Secondary | ICD-10-CM

## 2015-06-13 DIAGNOSIS — N186 End stage renal disease: Secondary | ICD-10-CM | POA: Diagnosis not present

## 2015-06-13 LAB — BASIC METABOLIC PANEL
Anion gap: 10 (ref 5–15)
BUN: 22 mg/dL — AB (ref 6–20)
CHLORIDE: 99 mmol/L — AB (ref 101–111)
CO2: 26 mmol/L (ref 22–32)
Calcium: 9.3 mg/dL (ref 8.9–10.3)
Creatinine, Ser: 4.63 mg/dL — ABNORMAL HIGH (ref 0.44–1.00)
GFR calc Af Amer: 10 mL/min — ABNORMAL LOW (ref >60)
GFR calc non Af Amer: 9 mL/min — ABNORMAL LOW (ref >60)
GLUCOSE: 81 mg/dL (ref 65–99)
POTASSIUM: 4.2 mmol/L (ref 3.5–5.1)
Sodium: 135 mmol/L (ref 135–145)

## 2015-06-13 LAB — CBC
HCT: 32.3 % — ABNORMAL LOW (ref 36.0–46.0)
HEMOGLOBIN: 9.8 g/dL — AB (ref 12.0–15.0)
MCH: 23 pg — AB (ref 26.0–34.0)
MCHC: 30.3 g/dL (ref 30.0–36.0)
MCV: 75.6 fL — AB (ref 78.0–100.0)
Platelets: 369 10*3/uL (ref 150–400)
RBC: 4.27 MIL/uL (ref 3.87–5.11)
RDW: 21.6 % — ABNORMAL HIGH (ref 11.5–15.5)
WBC: 7.6 10*3/uL (ref 4.0–10.5)

## 2015-06-13 LAB — TSH: TSH: 0.48 u[IU]/mL (ref 0.350–4.500)

## 2015-06-13 MED ORDER — HYDROCODONE-ACETAMINOPHEN 7.5-325 MG PO TABS: 1.0000 | ORAL_TABLET | Freq: Four times a day (QID) | ORAL | Status: DC | PRN

## 2015-06-13 MED ORDER — HYDRALAZINE HCL 25 MG PO TABS
100.0000 mg | ORAL_TABLET | Freq: Four times a day (QID) | ORAL | Status: DC
  Administered 2015-06-13 – 2015-06-16 (×13): 100 mg via ORAL
  Filled 2015-06-13 (×13): qty 4

## 2015-06-13 MED ORDER — MENTHOL 3 MG MT LOZG
1.0000 | LOZENGE | OROMUCOSAL | Status: DC | PRN
  Administered 2015-06-15: 3 mg via ORAL
  Filled 2015-06-13 (×2): qty 9

## 2015-06-13 MED ORDER — MECLIZINE HCL 12.5 MG PO TABS
12.5000 mg | ORAL_TABLET | Freq: Three times a day (TID) | ORAL | Status: DC | PRN
  Administered 2015-06-13 – 2015-06-16 (×4): 12.5 mg via ORAL
  Filled 2015-06-13 (×6): qty 1

## 2015-06-13 MED ORDER — PANTOPRAZOLE SODIUM 40 MG PO TBEC
40.0000 mg | DELAYED_RELEASE_TABLET | Freq: Every day | ORAL | Status: DC
  Administered 2015-06-13 – 2015-06-16 (×4): 40 mg via ORAL
  Filled 2015-06-13 (×4): qty 1

## 2015-06-13 MED ORDER — MORPHINE SULFATE (PF) 2 MG/ML IV SOLN: 2.0000 mg | INTRAVENOUS | Status: DC | PRN

## 2015-06-13 MED ORDER — NIFEDIPINE ER OSMOTIC RELEASE 30 MG PO TB24
90.0000 mg | ORAL_TABLET | Freq: Every day | ORAL | Status: DC
  Administered 2015-06-13 – 2015-06-16 (×4): 90 mg via ORAL
  Filled 2015-06-13 (×4): qty 3

## 2015-06-13 MED ORDER — LEVOTHYROXINE SODIUM 50 MCG PO TABS
50.0000 ug | ORAL_TABLET | Freq: Every day | ORAL | Status: DC
  Administered 2015-06-13 – 2015-06-16 (×4): 50 ug via ORAL
  Filled 2015-06-13: qty 2
  Filled 2015-06-13: qty 1
  Filled 2015-06-13 (×2): qty 2

## 2015-06-13 MED ORDER — SODIUM CHLORIDE 0.9 % IJ SOLN
3.0000 mL | Freq: Two times a day (BID) | INTRAMUSCULAR | Status: DC
  Administered 2015-06-13 – 2015-06-15 (×6): 3 mL via INTRAVENOUS

## 2015-06-13 MED ORDER — OXYCODONE-ACETAMINOPHEN 5-325 MG PO TABS
1.0000 | ORAL_TABLET | ORAL | Status: DC | PRN
  Administered 2015-06-15 – 2015-06-16 (×2): 1 via ORAL
  Filled 2015-06-13 (×2): qty 1

## 2015-06-13 MED ORDER — SENNOSIDES-DOCUSATE SODIUM 8.6-50 MG PO TABS: 1.0000 | ORAL_TABLET | Freq: Two times a day (BID) | ORAL | Status: DC | PRN

## 2015-06-13 MED ORDER — LABETALOL HCL 200 MG PO TABS
300.0000 mg | ORAL_TABLET | Freq: Two times a day (BID) | ORAL | Status: DC
  Administered 2015-06-13 – 2015-06-14 (×3): 300 mg via ORAL
  Filled 2015-06-13 (×3): qty 2

## 2015-06-13 MED ORDER — CLONIDINE HCL 0.2 MG PO TABS
0.3000 mg | ORAL_TABLET | Freq: Three times a day (TID) | ORAL | Status: DC
  Administered 2015-06-13 – 2015-06-15 (×7): 0.3 mg via ORAL
  Filled 2015-06-13 (×14): qty 1

## 2015-06-13 MED ORDER — HYDRALAZINE HCL 20 MG/ML IJ SOLN
10.0000 mg | INTRAMUSCULAR | Status: DC | PRN
  Administered 2015-06-13 (×2): 10 mg via INTRAVENOUS
  Filled 2015-06-13 (×2): qty 1

## 2015-06-13 MED ORDER — ALLOPURINOL 300 MG PO TABS
300.0000 mg | ORAL_TABLET | Freq: Every day | ORAL | Status: DC
  Administered 2015-06-13 – 2015-06-15 (×3): 300 mg via ORAL
  Filled 2015-06-13 (×3): qty 1

## 2015-06-13 MED ORDER — NICARDIPINE HCL IN NACL 20-0.86 MG/200ML-% IV SOLN
3.0000 mg/h | INTRAVENOUS | Status: DC
  Administered 2015-06-13: 3 mg/h via INTRAVENOUS
  Filled 2015-06-13: qty 200

## 2015-06-13 NOTE — Progress Notes (Signed)
Patient is a 64 year old woman with a history of malignant hypertension, ESRD on HD, ICH, recent left hip replacement, who was admitted by Dr. Marin Comment this morning for headache and hypertensive urgency. Patient was briefly seen and examined. Her chart, vital signs, laboratory studies were reviewed. Agree with current management with exception/additions below.  -Patient's blood pressure has improved, so we'll discontinue the Cardene drip. -Nephrologist, Dr. Lowanda Foster was consulted and will dialyze the patient tomorrow. -Patient's TSH was within normal limits at 0.4. -Her rapid strep test was negative.

## 2015-06-13 NOTE — H&P (Signed)
[facility] History and Physical  Sonya [name] [id number] DOB: 17-Oct-1950    PCP:   [name], MD   Chief Complaint: HA, found to have severe HTN.   HPI: Sonya [name] is an 64 y.o. female with hx of ESRD on HD (T, Thrs, Sat), last dialysis today, hx of hypothyrodism, severe HTN, hx of ICH, prior admission for severe HTN, recent hip replacement, presented to the ER with SBP over 200, and HA.  She has no Vx changes, focal neurological deficit or confusion.   In the ER, evaluation showed normal WBC, Cr of 4, with normal K, and EKG showed NSR with no acute ST T changes.  Several antiHTN meds were given in the ER without improvement of her systolic BP.  These included labetalol 40mg  IV, 80mg  IV, Clonodine orally 0.3 mg. She also complained of sorethroat, and strept screen was done. Hospitalist was asked to evaluate her for accelerated HTN.   Rewiew of Systems:  Constitutional: Negative for malaise, fever and chills. No significant weight loss or weight gain Eyes: Negative for eye pain, redness and discharge, diplopia, visual changes, or flashes of light. ENMT: Negative for ear pain, hoarseness, nasal congestion, sinus pressure and  tinnitus, drooling, or problem swallowing. Cardiovascular: Negative for chest pain, palpitations, diaphoresis, dyspnea and peripheral edema. ; No orthopnea, PND Respiratory: Negative for cough, hemoptysis, wheezing and stridor. No pleuritic chestpain. Gastrointestinal: Negative for nausea, vomiting, diarrhea, constipation, abdominal pain, melena, blood in stool, hematemesis, jaundice and rectal bleeding.    Genitourinary: Negative for frequency, dysuria, incontinence,flank pain and hematuria; Musculoskeletal: Negative for back pain and neck pain. Negative for swelling and trauma.;  Skin: . Negative for pruritus, rash, abrasions, bruising and skin lesion.; ulcerations Neuro: Negative for headache, lightheadedness and neck stiffness. Negative for  weakness, altered level of consciousness , altered mental status, extremity weakness, burning feet, involuntary movement, seizure and syncope.  Psych: negative for anxiety, depression, insomnia, tearfulness, panic attacks, hallucinations, paranoia, suicidal or homicidal ideation    Past Medical History  Diagnosis Date  . HTN (hypertension)   . Hypothyroidism   . Uterine cancer ([place]) 2012  . Anemia   . CKD (chronic kidney disease)   . Dysphagia   . Hyperparathyroidism due to renal insufficiency ([place])   . GERD (gastroesophageal reflux disease)   . Diverticulitis   . Vertical diplopia   . Vertigo     Past Surgical History  Procedure Laterality Date  . Cholecystectomy    . Cesarean section    . Laparoscopic total hysterectomy    . Colonoscopy  2000    TICS, IH  . Colonoscopy  2003 NUR BRBPR D50 V6    DC/ TICS, IH  . Abdominal hysterectomy    . Nasal septum surgery    . Esophagogastroduodenoscopy (egd) with esophageal dilation N/A 06/08/2013    Dr. Fields:Stricture at the gastroesophageal junction/MILD NON-erosive gastritis (inflammation) was found in the gastric antrum; multiple biopsies/NO SOURCE FOR ANEMIA IDETIFIED-MOST LIKELY DUE TO ANEMIA OF CHRONIC DISEASE  . Colonoscopy N/A 09/04/2013    Procedure: COLONOSCOPY;  Surgeon: Danie Binder, MD;  [facility];  Service: Endoscopy;  Laterality: N/A;  10:30AM-moved to 9:25 [name]  . Appendectomy    . Bascilic vein transposition Right 09/17/2013    Procedure: BRACHIAL VEIN TRANSPOSITION;  Surgeon: Conrad [place], MD;  Location: [place];  Service: Vascular;  Laterality: Right;  . Insertion of dialysis catheter Left 09/17/2013    Procedure: INSERTION OF  DIALYSIS CATHETER;  Surgeon: Conrad Granger, MD;  Location: [place];  Service: Vascular;  Laterality: Left;  . Bascilic vein transposition Right 10/29/2013    Procedure: RIGHT SECOND STAGE BRACHIAL VEIN TRANSPOSITION;  Surgeon: Conrad [place], MD;  Location: [facility] OR;   Service: Vascular;  Laterality: Right;  . Givens capsule study N/A 12/03/2013    Procedure: GIVENS CAPSULE STUDY;  Surgeon: Danie Binder, MD;  [facility];  Service: Endoscopy;  Laterality: N/A;  7:30  . Esophagogastroduodenoscopy N/A 05/23/2015    Procedure: ESOPHAGOGASTRODUODENOSCOPY (EGD);  Surgeon: Danie Binder, MD;  [facility];  Service: Endoscopy;  Laterality: N/A;  1230  . Savory dilation N/A 05/23/2015    Procedure: SAVORY DILATION;  Surgeon: Danie Binder, MD;  [facility];  Service: Endoscopy;  Laterality: N/A;  . Hip pinning,cannulated Left 05/31/2015    Procedure: CANNULATED HIP PINNING;  Surgeon: Leandrew Koyanagi, MD;  Location: [place];  Service: Orthopedics;  Laterality: Left;    Medications:  HOME MEDS: Prior to Admission medications   Medication Sig Start Date End Date Taking? Authorizing Provider  allopurinol (ZYLOPRIM) 300 MG tablet Take 300 mg by mouth daily.  04/10/15  Yes Historical Provider, MD  cloNIDine (CATAPRES) 0.3 MG tablet Take 1 tablet (0.3 mg total) by mouth 3 (three) times daily. 06/02/15  Yes Thurnell Lose, MD  enoxaparin (LOVENOX) 30 MG/0.3ML injection Inject 0.3 mLs (30 mg total) into the skin daily. 05/31/15  Yes Naiping Ephriam Jenkins, MD  hydrALAZINE (APRESOLINE) 100 MG tablet Take 1 tablet (100 mg total) by mouth every 6 (six) hours. 06/04/15  Yes Thurnell Lose, MD  HYDROcodone-acetaminophen (NORCO) 7.5-325 MG tablet Take 1-2 tablets by mouth every 6 (six) hours as needed for moderate pain. Patient taking differently: Take 1 tablet by mouth every 6 (six) hours as needed for moderate pain.  05/31/15  Yes Leandrew Koyanagi, MD  labetalol (NORMODYNE) 300 MG tablet Take 300 mg by mouth 2 (two) times daily.   Yes Historical Provider, MD  levothyroxine (SYNTHROID, LEVOTHROID) 50 MCG tablet Take 1 tablet (50 mcg total) by mouth daily. 04/17/15  Yes Samuella Cota, MD  lidocaine-prilocaine (EMLA) cream Apply 1 application topically as  needed (on Dialysis days Tues,Thurs.,Sat.).   Yes Historical Provider, MD  metoCLOPramide (REGLAN) 5 MG tablet 1 po before breakfast and lunch and at bedtime. Patient taking differently: Take 5 mg by mouth 4 (four) times daily -  before meals and at bedtime.  05/13/15  Yes Danie Binder, MD  NIFEdipine (PROCARDIA XL/ADALAT-CC) 90 MG 24 hr tablet Take 90 mg by mouth daily. 04/10/15  Yes Historical Provider, MD  omeprazole (PRILOSEC) 20 MG capsule Take 1 capsule (20 mg total) by mouth daily. 07/29/14  Yes Orvil Feil, NP  senna-docusate (SENOKOT-S) 8.6-50 MG tablet Take 1 tablet by mouth 2 (two) times daily as needed for mild constipation.   Yes Historical Provider, MD  alendronate (FOSAMAX) 70 MG tablet Take 70 mg by mouth once a week.  05/12/15   Historical Provider, MD  meclizine (ANTIVERT) 25 MG tablet Take 1 tablet by mouth every 6 (six) hours as needed for dizziness. dizziness 11/22/14   Historical Provider, MD     Allergies:  Allergies  Allergen Reactions  . Cephalexin Itching    Social History:   reports that she has never smoked. She has never used smokeless tobacco. She reports that she does not drink alcohol or use illicit drugs.  Family History: Family History  Problem Relation Age of Onset  . Colon cancer Neg Hx   . Cancer Mother   . Diabetes Mother   . Hypertension Mother   . Diabetes Father   . Hypertension Father   . Hypertension Sister      Physical Exam: Filed Vitals:   06/12/15 2130 06/12/15 2200 06/12/15 2231 06/12/15 2259  BP: 228/93 230/93 228/94 212/94  Pulse: 88 85 85 85  Temp:      TempSrc:      Resp: 19 21 16 16   Height:      Weight:      SpO2: 98% 100% 100% 100%   Blood pressure 212/94, pulse 85, temperature 98.9 F (37.2 C), temperature source Oral, resp. rate 16, height 5\' 6"  (1.676 m), weight 63.05 kg (139 lb), SpO2 100 %.  GEN:  Pleasant  patient lying in the stretcher in no acute distress; cooperative with exam. PSYCH:  alert and oriented  x4; does not appear anxious or depressed; affect is appropriate. HEENT: Mucous membranes pink and anicteric; PERRLA; EOM intact; no cervical lymphadenopathy nor thyromegaly or carotid bruit; no JVD; There were no stridor. Neck is very supple. I was not able to see her disc margin. Breasts:: Not examined CHEST WALL: No tenderness CHEST: Normal respiration, clear to auscultation bilaterally.  HEART: Regular rate and rhythm.  There are no murmur, rub, or gallops.   BACK: No kyphosis or scoliosis; no CVA tenderness ABDOMEN: soft and non-tender; no masses, no organomegaly, normal abdominal bowel sounds; no pannus; no intertriginous candida. There is no rebound and no distention. Rectal Exam: Not done EXTREMITIES: No bone or joint deformity; age-appropriate arthropathy of the hands and knees; no edema; no ulcerations.  There is no calf tenderness. Genitalia: not examined PULSES: 2+ and symmetric SKIN: Normal hydration no rash or ulceration CNS: Cranial nerves 2-12 grossly intact no focal lateralizing neurologic deficit.  Speech is fluent; uvula elevated with phonation, facial symmetry and tongue midline. DTR are normal bilaterally, cerebella exam is intact, barbinski is negative and strengths are equaled bilaterally.  No sensory loss.   Labs on Admission:  Basic Metabolic Panel:  Recent Labs Lab 06/12/15 2257  NA 135  K 4.5  CL 98*  CO2 27  GLUCOSE 92  BUN 20  CREATININE 4.25*  CALCIUM 9.3   CBC:  Recent Labs Lab 06/12/15 2257  WBC 7.1  NEUTROABS 4.3  HGB 10.1*  HCT 32.8*  MCV 75.4*  PLT 303    Radiological Exams on Admission: Ct Head Wo Contrast  06/12/2015  CLINICAL DATA:  64 year old female with headache and elevated blood pressure post dialysis. Subsequent encounter. EXAM: CT HEAD WITHOUT CONTRAST TECHNIQUE: Contiguous axial images were obtained from the base of the skull through the vertex without intravenous contrast. COMPARISON:  06/03/2015 head CT.  11/25/2014 brain  MR. FINDINGS: No intracranial hemorrhage. Prominent small vessel disease type changes. Remote left thalamic infarct. No CT evidence of large acute infarct. 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 Global atrophy without hydrocephalus. Vascular calcifications. IMPRESSION: No intracranial hemorrhage. Prominent small vessel disease type changes. Remote left thalamic infarct. No CT evidence of large acute infarct. 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 Global atrophy without hydrocephalus. Electronically Signed   By: Genia Del M.D.   On: 06/12/2015 16:25    EKG: Independently reviewed.    Assessment/Plan Present on Admission:  . Accelerated hypertension . End stage renal disease ([place]) . Uncontrolled hypertension . HA (headache) .  HTN (hypertension), malignant  PLAN:  Will admit her to the SDU and begin Nicardipine drip to keep SBP around 180 to 190.  I will continue all her anti HTN meds including Procardia, Betablocker, and alpha blocker.  Will give her some pain meds, though her hip is not hurting her, she said.  She is stable, full code, and will be admitted to [facility] service.  If she is still in the hospital on Sat, please consult nephrology as she will need routine dialysis on Saturday.  Thank you and Good Day.   Other plans as per orders.  Code Status: FULL Haskel Khan, MD. [facility] Pager [phone] 7pm to 7am.  06/13/2015, 12:22 AM

## 2015-06-13 NOTE — Progress Notes (Signed)
Initial Nutrition Assessment  DOCUMENTATION CODES:  Not applicable  INTERVENTION:  HS snack per pt request  Monitor Po intake and reoffer snacks/supplements as warranted  NUTRITION DIAGNOSIS:  Increased nutrient needs related to chronic illness (ESRD on HD) as evidenced by estimated nutritional requirements for the condition  GOAL:  Patient will meet greater than or equal to 90% of their needs  MONITOR:  PO intake, Labs, Weight trends, I & O's  REASON FOR ASSESSMENT:  Malnutrition Screening Tool    ASSESSMENT:  64 y.o. female with hx of ESRD on HD, hypothyrodism, severe HTN, hx of ICH, diverticulitis, Anemia and recent hip replacement, presented to the ER shortly after dilaysis with SBP over 200, and HA  Pt reports that she has had no issues with her intake recently. Denies any n/v/c/d. She says she eats a protein bar each day. She hates nepro.  She states that her Dry weight after dialyis is typically 130-135 lbs.   Per documentation, she weighed 165 lbs in January of this year, She then lost a substantial amount of weight over the next few months. Recently she appears stable at 130-135 lbs. Unknown reason for this prior wt loss.   Offered patient multiple supplements and emphasized the importance of extra protein/kcal for HD patient, but she adamantly refused any type of supplement. She states she is leaving soon anyway. However, she was open to snacks. Will try to order her an HS snack tonight.   Diet Order:  Diet 2 gram sodium Room service appropriate?: Yes; Fluid consistency:: Thin  Skin:  Reviewed, no issues  Last BM:  11/11  Height:  Ht Readings from Last 1 Encounters:  06/13/15 5\' 6"  (1.676 m)   Weight:  Wt Readings from Last 1 Encounters:  06/13/15 134 lb 0.6 oz (60.8 kg)   Wt Readings from Last 10 Encounters:  06/13/15 134 lb 0.6 oz (60.8 kg)  06/03/15 139 lb 12.4 oz (63.4 kg)  05/13/15 136 lb 9.6 oz (61.961 kg)  04/24/15 130 lb (58.968 kg)  04/17/15 136  lb 11 oz (62 kg)  11/27/14 146 lb (66.225 kg)  11/26/14 146 lb (66.225 kg)  11/25/14 146 lb (66.225 kg)  11/25/14 146 lb (66.225 kg)  10/22/14 129 lb 10.1 oz (58.8 kg)   Ideal Body Weight:  59.1 kg  BMI:  Body mass index is 21.64 kg/(m^2).  Estimated Nutritional Needs:  Kcal:  1800-2000 (30-33 kcal/kg) Protein:  73-85 g Pro (1.2-1.4 g/kg IBW) Fluid:  1.8-2 liters fluid  EDUCATION NEEDS:  No education needs identified at this time  Burtis Junes RD, LDN Nutrition Pager: [phone] 06/13/2015 1:45 PM

## 2015-06-13 NOTE — NC FL2 (Signed)
[place] LEVEL OF CARE SCREENING TOOL     IDENTIFICATION  Patient Name: Sonya [name] Birthdate: 10/25/1950 Sex: female Admission Date (Current Location): 06/12/2015  [facility] and [place] Number:     Facility and Address:  [facility] [address] [place]      Provider Number: [id number]  Attending Physician Name and Address:  Rexene Alberts, MD  Relative Name and Phone Number:       Current Level of Care: Hospital Recommended Level of Care: [place] Prior Approval Number:    Date Approved/Denied:   PASRR Number: [id number]  Discharge Plan: SNF    Current Diagnoses: Patient Active Problem List   Diagnosis Date Noted  . HA (headache) 06/13/2015  . HTN (hypertension), malignant 06/13/2015  . Elective surgery   . Fracture of femoral neck, left, closed 05/30/2015  . Hip fracture ([place]) 05/30/2015  . Closed fracture of neck of left femur ([place])   . Loss of weight   . Emesis   . Nausea with vomiting 05/13/2015  . UTI (lower urinary tract infection) 04/14/2015  . Orthostatic hypotension 11/27/2014  . Accelerated hypertension 11/25/2014  . Dizziness   . Uncontrolled hypertension   . Acute encephalopathy   . Confusion   . Protein-calorie malnutrition, severe ([place]) 10/15/2014  . Dysphagia 10/15/2014  . Hypertensive emergency 10/14/2014  . Hypertensive urgency, malignant 10/14/2014  . Intracerebral bleed ([place]) 10/14/2014  . End stage renal disease ([place]) 08/31/2013  . Symptomatic anemia 08/18/2013  . Hyperparathyroidism due to renal insufficiency ([place]) 07/09/2013  . Unspecified constipation 06/21/2013  . Gastritis 06/21/2013  . Anemia 06/20/2013  . Dizzy 06/20/2013  . Chronic kidney disease 06/20/2013  . Anemia in chronic kidney disease 06/08/2013  . Regurgitation 06/08/2013  . Hypokalemia 06/08/2013  . Gout 06/08/2013  . Other dysphagia 06/07/2013  . Hypertensive urgency 06/07/2013  . [place]  04/08/2011  . Essential hypertension 04/08/2011  . Colon cancer screening 04/08/2011    Orientation ACTIVITIES/SOCIAL BLADDER RESPIRATION    Self, Time, Situation, Place  Family supportive Continent Normal  BEHAVIORAL SYMPTOMS/MOOD NEUROLOGICAL BOWEL NUTRITION STATUS  Other (Comment) (n/a)  (n/a) Continent Diet- 2 gram sodium  PHYSICIAN VISITS COMMUNICATION OF NEEDS Height & Weight Skin    Verbally 5\' 6"  (167.6 cm) 134 lbs. Surgical wounds          AMBULATORY STATUS RESPIRATION    Supervision limited Normal      Personal Care Assistance Level of Assistance  Bathing, Feeding, Dressing Bathing Assistance: Limited assistance Feeding assistance: Limited assistance Dressing Assistance: Limited assistance      Functional Limitations Info  Sight, Hearing, Speech Sight Info: Adequate Hearing Info: Adequate Speech Info: Adequate       SPECIAL CARE FACTORS FREQUENCY  PT (By licensed PT)                   Additional Factors Info  Code Status, Allergies Code Status Info: Full Allergies Info: Cephalexin           Current Medications (06/13/2015): Current Facility-Administered Medications  Medication Dose Route Frequency Provider Last Rate Last Dose  . allopurinol (ZYLOPRIM) tablet 300 mg  300 mg Oral Daily Orvan Falconer, MD   300 mg at 06/13/15 0903  . cloNIDine (CATAPRES) tablet 0.3 mg  0.3 mg Oral TID Orvan Falconer, MD   0.3 mg at 06/13/15 0903  . hydrALAZINE (APRESOLINE) tablet 100 mg  100 mg Oral Q6H Orvan Falconer, MD   100 mg at  06/13/15 0814  . HYDROcodone-acetaminophen (NORCO) 7.5-325 MG per tablet 1 tablet  1 tablet Oral Q6H PRN Orvan Falconer, MD      . labetalol (NORMODYNE) tablet 300 mg  300 mg Oral BID Orvan Falconer, MD   300 mg at 06/13/15 0903  . levothyroxine (SYNTHROID, LEVOTHROID) tablet 50 mcg  50 mcg Oral QAC breakfast Orvan Falconer, MD   50 mcg at 06/13/15 [phone]  . menthol-cetylpyridinium (CEPACOL) lozenge 3 mg  1 lozenge Oral PRN Orvan Falconer, MD      . morphine 2 MG/ML injection  2 mg  2 mg Intravenous Q4H PRN Orvan Falconer, MD      . nicardipine (CARDENE) 20mg  in 0.86% saline 220ml IV infusion (0.1 mg/ml)  3-15 mg/hr Intravenous Continuous Orvan Falconer, MD   Stopped at 06/13/15 0912  . NIFEdipine (PROCARDIA-XL/ADALAT-CC/NIFEDICAL-XL) 24 hr tablet 90 mg  90 mg Oral Daily Orvan Falconer, MD   90 mg at 06/13/15 0903  . pantoprazole (PROTONIX) EC tablet 40 mg  40 mg Oral Daily Orvan Falconer, MD   40 mg at 06/13/15 0903  . senna-docusate (Senokot-S) tablet 1 tablet  1 tablet Oral BID PRN Orvan Falconer, MD      . sodium chloride 0.9 % injection 3 mL  3 mL Intravenous Q12H Orvan Falconer, MD   3 mL at 06/13/15 0905   Do not use this list as official medication orders. Please verify with discharge summary.  Discharge Medications:   Medication List    ASK your doctor about these medications        alendronate 70 MG tablet  Commonly known as:  FOSAMAX  Take 70 mg by mouth once a week.     allopurinol 300 MG tablet  Commonly known as:  ZYLOPRIM  Take 300 mg by mouth daily.     cloNIDine 0.3 MG tablet  Commonly known as:  CATAPRES  Take 1 tablet (0.3 mg total) by mouth 3 (three) times daily.     enoxaparin 30 MG/0.3ML injection  Commonly known as:  LOVENOX  Inject 0.3 mLs (30 mg total) into the skin daily.     hydrALAZINE 100 MG tablet  Commonly known as:  APRESOLINE  Take 1 tablet (100 mg total) by mouth every 6 (six) hours.     HYDROcodone-acetaminophen 7.5-325 MG tablet  Commonly known as:  NORCO  Take 1-2 tablets by mouth every 6 (six) hours as needed for moderate pain.     labetalol 300 MG tablet  Commonly known as:  NORMODYNE  Take 300 mg by mouth 2 (two) times daily.     levothyroxine 50 MCG tablet  Commonly known as:  SYNTHROID, LEVOTHROID  Take 1 tablet (50 mcg total) by mouth daily.     lidocaine-prilocaine cream  Commonly known as:  EMLA  Apply 1 application topically as needed (on Dialysis days Tues,Thurs.,Sat.).     meclizine 25 MG tablet  Commonly known as:  ANTIVERT   Take 1 tablet by mouth every 6 (six) hours as needed for dizziness. dizziness     metoCLOPramide 5 MG tablet  Commonly known as:  REGLAN  1 po before breakfast and lunch and at bedtime.     NIFEdipine 90 MG 24 hr tablet  Commonly known as:  PROCARDIA XL/ADALAT-CC  Take 90 mg by mouth daily.     omeprazole 20 MG capsule  Commonly known as:  PRILOSEC  Take 1 capsule (20 mg total) by mouth daily.     senna-docusate 8.6-50 MG tablet  Commonly known as:  Senokot-S  Take 1 tablet by mouth 2 (two) times daily as needed for mild constipation.        Relevant Imaging Results:  Relevant Lab Results:  Recent Labs    Additional Information    Salome Arnt, [place]

## 2015-06-13 NOTE — Progress Notes (Signed)
Patient ran 5-beat run of V-tach with no complaints or symptoms noted. Dr. Caryn Section notified. No new orders at this time.

## 2015-06-13 NOTE — Consult Note (Signed)
Reason for Consult: End-stage renal disease and hypertension Referring Physician: Dr. [name] is an 64 y.o. female.  HPI: She is a patient who has history of hypertension, hypothyroidism, end-stage renal disease on maintenance hemodialysis presently came with complaints of severe headache and accelerated hypertension. According the patient the last couple of times she was having similar problem after she was discharged to [facility]. Patient states that she is feeling much better today. She had her dialysis yesterday and when she came to the emergency room she was found to have accelerated hypertension hence admitted to the hospital. Patient denies any nausea or vomiting.  Past Medical History  Diagnosis Date  . HTN (hypertension)   . Hypothyroidism   . Uterine cancer ([place]) 2012  . Anemia   . CKD (chronic kidney disease)   . Dysphagia   . Hyperparathyroidism due to renal insufficiency ([place])   . GERD (gastroesophageal reflux disease)   . Diverticulitis   . Vertical diplopia   . Vertigo   . Hypertensive urgency 06/2013 & 04/2015  . Fracture of femoral neck, left, closed 05/30/2015  . Gastritis 06/2013 & 05/2015  . Gastroesophageal reflux disease with stricture 05/2015  . ESRD (end stage renal disease) ([place])     On HD  . ICH (intracerebral hemorrhage) ([place]) 10/2014    Past Surgical History  Procedure Laterality Date  . Cholecystectomy    . Cesarean section    . Laparoscopic total hysterectomy    . Colonoscopy  2000    TICS, IH  . Colonoscopy  2003 NUR BRBPR D50 V6    DC/Sonya [name] TICS, IH  . Abdominal hysterectomy    . Nasal septum surgery    . Esophagogastroduodenoscopy (egd) with esophageal dilation N/A 06/08/2013    Dr. Fields:Stricture at the gastroesophageal junction/MILD NON-erosive gastritis (inflammation) was found in the gastric antrum; multiple biopsies/NO SOURCE FOR ANEMIA IDETIFIED-MOST LIKELY DUE TO ANEMIA OF CHRONIC DISEASE  . Colonoscopy N/A 09/04/2013     Procedure: COLONOSCOPY;  Surgeon: Sonya Binder, MD;  [facility];  Service: Endoscopy;  Laterality: N/A;  10:30AM-moved to 9:25 Sonya [name] to notify pt  . Appendectomy    . Bascilic vein transposition Right 09/17/2013    Procedure: BRACHIAL VEIN TRANSPOSITION;  Surgeon: Sonya [place], MD;  Location: [facility];  Service: Vascular;  Laterality: Right;  . Insertion of dialysis catheter Left 09/17/2013    Procedure: INSERTION OF DIALYSIS CATHETER;  Surgeon: Sonya [place], MD;  Location: [facility];  Service: Vascular;  Laterality: Left;  . Bascilic vein transposition Right 10/29/2013    Procedure: RIGHT SECOND STAGE BRACHIAL VEIN TRANSPOSITION;  Surgeon: Sonya [place], MD;  Location: [facility] OR;  Service: Vascular;  Laterality: Right;  . Givens capsule study N/A 12/03/2013    Procedure: GIVENS CAPSULE STUDY;  Surgeon: Sonya Binder, MD;  [facility];  Service: Endoscopy;  Laterality: N/A;  7:30  . Esophagogastroduodenoscopy N/A 05/23/2015    Procedure: ESOPHAGOGASTRODUODENOSCOPY (EGD);  Surgeon: Sonya Binder, MD;  [facility];  Service: Endoscopy;  Laterality: N/A;  1230  . Savory dilation N/A 05/23/2015    Procedure: SAVORY DILATION;  Surgeon: Sonya Binder, MD;  [facility];  Service: Endoscopy;  Laterality: N/A;  . Hip pinning,cannulated Left 05/31/2015    Procedure: CANNULATED HIP PINNING;  Surgeon: Sonya Koyanagi, MD;  Location: [place];  Service: Orthopedics;  Laterality: Left;    Family History  Problem Relation Age  of Onset  . Colon cancer Neg Hx   . Cancer Mother   . Diabetes Mother   . Hypertension Mother   . Diabetes Father   . Hypertension Father   . Hypertension Sister     Social History:  reports that she has never smoked. She has never used smokeless tobacco. She reports that she does not drink alcohol or use illicit drugs.  Allergies:  Allergies  Allergen Reactions  . Cephalexin Itching    Medications: I have reviewed the patient's  current medications.  Results for orders placed or performed during the hospital encounter of 06/12/15 (from the past 48 hour(s))  Rapid strep screen     Status: None   Collection Time: 06/12/15  7:22 PM  Result Value Ref Range   Streptococcus, Group A Screen (Direct) NEGATIVE NEGATIVE    Comment: (NOTE) A Rapid Antigen test may result negative if the antigen level in the sample is below the detection level of this test. The FDA has not cleared this test as a stand-alone test therefore the rapid antigen negative result has reflexed to a Group A Strep culture.   CBC with Differential/Platelet     Status: Abnormal   Collection Time: 06/12/15 10:57 PM  Result Value Ref Range   WBC 7.1 4.0 - 10.5 K/uL   RBC 4.35 3.87 - 5.11 MIL/uL   Hemoglobin 10.1 (L) 12.0 - 15.0 g/dL   HCT 36.4 (L) 93.2 - 27.9 %   MCV 75.4 (L) 78.0 - 100.0 fL   MCH 23.2 (L) 26.0 - 34.0 pg   MCHC 30.8 30.0 - 36.0 g/dL    Comment: REPEATED TO VERIFY   RDW 21.2 (H) 11.5 - 15.5 %   Platelets 303 150 - 400 K/uL   Neutrophils Relative % 60 %   Neutro Abs 4.3 1.7 - 7.7 K/uL   Lymphocytes Relative 27 %   Lymphs Abs 1.9 0.7 - 4.0 K/uL   Monocytes Relative 11 %   Monocytes Absolute 0.8 0.1 - 1.0 K/uL   Eosinophils Relative 1 %   Eosinophils Absolute 0.1 0.0 - 0.7 K/uL   Basophils Relative 1 %   Basophils Absolute 0.1 0.0 - 0.1 K/uL   RBC Morphology ELLIPTOCYTES     Comment: TARGET CELLS TEARDROP CELLS   Basic metabolic panel     Status: Abnormal   Collection Time: 06/12/15 10:57 PM  Result Value Ref Range   Sodium 135 135 - 145 mmol/L   Potassium 4.5 3.5 - 5.1 mmol/L   Chloride 98 (L) 101 - 111 mmol/L   CO2 27 22 - 32 mmol/L   Glucose, Bld 92 65 - 99 mg/dL   BUN 20 6 - 20 mg/dL   Creatinine, Ser 0.07 (H) 0.44 - 1.00 mg/dL   Calcium 9.3 8.9 - 54.0 mg/dL   GFR calc non Af Amer 10 (L) >60 mL/min   GFR calc Af Amer 12 (L) >60 mL/min    Comment: (NOTE) The eGFR has been calculated using the CKD EPI  equation. This calculation has not been validated in all clinical situations. eGFR's persistently <60 mL/min signify possible Chronic Kidney Disease.    Anion gap 10 5 - 15  TSH     Status: None   Collection Time: 06/13/15  4:42 AM  Result Value Ref Range   TSH 0.480 0.350 - 4.500 uIU/mL  Basic metabolic panel     Status: Abnormal   Collection Time: 06/13/15  4:42 AM  Result Value Ref Range  Sodium 135 135 - 145 mmol/L   Potassium 4.2 3.5 - 5.1 mmol/L   Chloride 99 (L) 101 - 111 mmol/L   CO2 26 22 - 32 mmol/L   Glucose, Bld 81 65 - 99 mg/dL   BUN 22 (H) 6 - 20 mg/dL   Creatinine, Ser 4.63 (H) 0.44 - 1.00 mg/dL   Calcium 9.3 8.9 - 10.3 mg/dL   GFR calc non Af Amer 9 (L) >60 mL/min   GFR calc Af Amer 10 (L) >60 mL/min    Comment: (NOTE) The eGFR has been calculated using the CKD EPI equation. This calculation has not been validated in all clinical situations. eGFR's persistently <60 mL/min signify possible Chronic Kidney Disease.    Anion gap 10 5 - 15  CBC     Status: Abnormal   Collection Time: 06/13/15  4:42 AM  Result Value Ref Range   WBC 7.6 4.0 - 10.5 K/uL   RBC 4.27 3.87 - 5.11 MIL/uL   Hemoglobin 9.8 (L) 12.0 - 15.0 g/dL   HCT 32.3 (L) 36.0 - 46.0 %   MCV 75.6 (L) 78.0 - 100.0 fL   MCH 23.0 (L) 26.0 - 34.0 pg   MCHC 30.3 30.0 - 36.0 g/dL   RDW 21.6 (H) 11.5 - 15.5 %   Platelets 369 150 - 400 K/uL    Ct Head Wo Contrast  06/12/2015  CLINICAL DATA:  64 year old female with headache and elevated blood pressure post dialysis. Subsequent encounter. EXAM: CT HEAD WITHOUT CONTRAST TECHNIQUE: Contiguous axial images were obtained from the base of the skull through the vertex without intravenous contrast. COMPARISON:  06/03/2015 head CT.  11/25/2014 brain MR. FINDINGS: No intracranial hemorrhage. Prominent small vessel disease type changes. Remote left thalamic infarct. No CT evidence of large acute infarct. 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  Global atrophy without hydrocephalus. Vascular calcifications. IMPRESSION: No intracranial hemorrhage. Prominent small vessel disease type changes. Remote left thalamic infarct. No CT evidence of large acute infarct. 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 Global atrophy without hydrocephalus. Electronically Signed   By: Genia Del M.D.   On: 06/12/2015 16:25    Review of Systems  Constitutional: Negative for chills.  HENT: Positive for congestion and sore throat.   Respiratory: Negative for shortness of breath.   Cardiovascular: Negative for orthopnea and PND.  Gastrointestinal: Negative for nausea and vomiting.  Neurological: Positive for weakness and headaches.   Blood pressure 171/77, pulse 83, temperature 99.7 F (37.6 C), temperature source Oral, resp. rate 22, height $RemoveBe'5\' 6"'EidXeOOpV$  (1.676 m), weight 134 lb 0.6 oz (60.8 kg), SpO2 98 %. Physical Exam  Constitutional: She is oriented to person, place, and time. No distress.  Eyes: No scleral icterus.  Neck: No JVD present.  Cardiovascular: Normal rate and regular rhythm.   Respiratory: She has no wheezes. She has no rales.  GI: She exhibits no distension. There is no tenderness.  Musculoskeletal: She exhibits no edema.  Neurological: She is alert and oriented to person, place, and time.    Assessment/Plan: Problem #1 headache: Possibly related to her hypertension and presently feeling better. Problem #2 accelerated hypertension: Patient presently on clonidine/labetalol/hydralazine/and Cardene drip. Her blood pressure seems to be better. Problem #3 history of end-stage renal disease: She is status post hemodialysis yesterday presently patient does not have any uremic signs and symptoms. Her potassium is normal. Problem #4 anemia: Her hemoglobin is below our target goal. Patient is on Epogen. Problem #5 metabolic bone disease:  Her calcium is range Problem #6 hypothyroidism: Patient on Synthroid Problem #7 history of all  and fracture of her hip Problem #8 Fluid management: Patient doesn't have significant side of fluid overload. Plan: 1] We'll continue his present management 2] we'll make arrangements for patient to get dialysis tomorrow 3] we'll use Epogen 10,000 units IV after each dialysis 4] we'll check her basic metabolic panel and phosphorus in the morning.  Sonya [name] S 06/13/2015, 8:51 AM

## 2015-06-13 NOTE — Care Management Note (Signed)
Case Management Note  Patient Details  Name: Sonya [name] MRN: [id number] Date of Birth: 10-16-1950  Subjective/Objective:                  Pt admitted from [facility] with HTN urgency. Pt will return to facility to finish rehab at discharge.  Action/Plan: CSW is aware and will arrange discharge to facility when medically stable.  Expected Discharge Date:                  Expected Discharge Plan:  Skilled Nursing Facility  In-House Referral:  Clinical Social Work  Discharge planning Services  CM Consult  Post Acute Care Choice:  NA Choice offered to:  NA  DME Arranged:    DME Agency:     HH Arranged:    [place] Agency:     Status of Service:  Completed, signed off  Medicare Important Message Given:    Date Medicare IM Given:    Medicare IM give by:    Date Additional Medicare IM Given:    Additional Medicare Important Message give by:     If discussed at [place] of Stay Meetings, dates discussed:    Additional Comments:  Joylene Draft, RN 06/13/2015, 3:18 PM

## 2015-06-13 NOTE — Care Management Obs Status (Signed)
[place] NOTIFICATION   Patient Details  Name: Sonya [name] MRN: [id number] Date of Birth: 06/03/1951   Medicare Observation Status Notification Given:  Yes    Joylene Draft, RN 06/13/2015, 3:18 PM

## 2015-06-13 NOTE — ED Notes (Signed)
Report given to the floor,

## 2015-06-13 NOTE — Clinical Social Work Note (Addendum)
Clinical Social Work Assessment  Patient Details  Name: Sonya [name] MRN: [id number] Date of Birth: Jul 12, 1951  Date of referral:  06/13/15               Reason for consult:  Discharge Planning                Permission sought to share information with:  Facility Art therapist granted to share information::  Yes, Verbal Permission Granted  Name::        Agency::  [facility]  Relationship::     Contact Information:     Housing/Transportation Living arrangements for the past 2 months:  [place], [place] of Information:  Patient, Facility Patient Interpreter Needed:  None Criminal Activity/Legal Involvement Pertinent to Current Situation/Hospitalization:  No - Comment as needed Significant Relationships:  Spouse, Adult Children Lives with:  Facility Resident Do you feel safe going back to the place where you live?  Yes Need for family participation in patient care:  Yes (Comment)  Care giving concerns:  Pt is currently resident at [facility] [place].    Social Worker assessment / plan:  CSW met with pt at bedside. Pt alert and oriented and reports she has been at [facility] for a few weeks for rehab after a hip fracture. Pt indicates that her husband and daughter are very involved and visit daily. She shared that [facility] was "okay," but "Nothing is like being at home." CSW validated feelings. Pt reports her goal is to complete rehab and get home as soon as possible. She states that therapy at [facility] has been pleased with her progress. Per Jackelyn Poling at facility, pt is skilled level of care and okay to return when medically stable. Pt goes to dialysis on Tuesday, Thursday, Saturday schedule at [facility].  Employment status:  Retired Forensic scientist:  Medicare PT Recommendations:  [place] / Referral to community resources:  Other (Comment Required) (return to [facility])  Patient/Family's Response to care:  Pt plans to return to  [facility] to complete rehab.   Patient/Family's Understanding of and Emotional Response to Diagnosis, Current Treatment, and Prognosis:  Pt aware of admission diagnosis and reports she is waiting to see MD this morning. Per facility, pt is not always compliant with medications. They have called her husband in the past to encourage her and this sometimes helps.   Emotional Assessment Appearance:  Appears stated age Attitude/Demeanor/Rapport:  Other (cooperative) Affect (typically observed):  Quiet, Appropriate Orientation:  Oriented to Self, Oriented to Place, Oriented to  Time, Oriented to Situation Alcohol / Substance use:  Not Applicable Psych involvement (Current and /or in the community):  No (Comment)  Discharge Needs  Concerns to be addressed:  Discharge Planning Concerns Readmission within the last 30 days:  Yes Current discharge risk:  None Barriers to Discharge:  Continued Medical Work up   [employer], [employer], [place] 06/13/2015, 9:00 AM [phone]

## 2015-06-14 DIAGNOSIS — E039 Hypothyroidism, unspecified: Secondary | ICD-10-CM | POA: Diagnosis present

## 2015-06-14 DIAGNOSIS — N186 End stage renal disease: Secondary | ICD-10-CM | POA: Diagnosis not present

## 2015-06-14 DIAGNOSIS — G441 Vascular headache, not elsewhere classified: Secondary | ICD-10-CM | POA: Diagnosis not present

## 2015-06-14 DIAGNOSIS — I10 Essential (primary) hypertension: Secondary | ICD-10-CM | POA: Diagnosis not present

## 2015-06-14 DIAGNOSIS — D631 Anemia in chronic kidney disease: Secondary | ICD-10-CM

## 2015-06-14 DIAGNOSIS — N189 Chronic kidney disease, unspecified: Secondary | ICD-10-CM

## 2015-06-14 DIAGNOSIS — Z992 Dependence on renal dialysis: Secondary | ICD-10-CM | POA: Diagnosis not present

## 2015-06-14 LAB — BASIC METABOLIC PANEL
ANION GAP: 8 (ref 5–15)
BUN: 33 mg/dL — ABNORMAL HIGH (ref 6–20)
CHLORIDE: 99 mmol/L — AB (ref 101–111)
CO2: 26 mmol/L (ref 22–32)
Calcium: 9.7 mg/dL (ref 8.9–10.3)
Creatinine, Ser: 6.13 mg/dL — ABNORMAL HIGH (ref 0.44–1.00)
GFR calc non Af Amer: 6 mL/min — ABNORMAL LOW (ref >60)
GFR, EST AFRICAN AMERICAN: 7 mL/min — AB (ref >60)
Glucose, Bld: 93 mg/dL (ref 65–99)
POTASSIUM: 4.6 mmol/L (ref 3.5–5.1)
Sodium: 133 mmol/L — ABNORMAL LOW (ref 135–145)

## 2015-06-14 LAB — CBC
HEMATOCRIT: 31.3 % — AB (ref 36.0–46.0)
HEMOGLOBIN: 9.9 g/dL — AB (ref 12.0–15.0)
MCH: 23.1 pg — AB (ref 26.0–34.0)
MCHC: 31.6 g/dL (ref 30.0–36.0)
MCV: 73.1 fL — AB (ref 78.0–100.0)
Platelets: 351 10*3/uL (ref 150–400)
RBC: 4.28 MIL/uL (ref 3.87–5.11)
RDW: 21 % — ABNORMAL HIGH (ref 11.5–15.5)
WBC: 4.6 10*3/uL (ref 4.0–10.5)

## 2015-06-14 LAB — MAGNESIUM: MAGNESIUM: 1.9 mg/dL (ref 1.7–2.4)

## 2015-06-14 LAB — PHOSPHORUS: Phosphorus: 4.6 mg/dL (ref 2.5–4.6)

## 2015-06-14 MED ORDER — HEPARIN SODIUM (PORCINE) 1000 UNIT/ML DIALYSIS: 1000.0000 [IU] | INTRAMUSCULAR | Status: DC | PRN

## 2015-06-14 MED ORDER — SODIUM CHLORIDE 0.9 % IV SOLN: 100.0000 mL | INTRAVENOUS | Status: DC | PRN

## 2015-06-14 MED ORDER — LIDOCAINE HCL (PF) 1 % IJ SOLN: 5.0000 mL | INTRAMUSCULAR | Status: DC | PRN

## 2015-06-14 MED ORDER — ALTEPLASE 2 MG IJ SOLR
2.0000 mg | Freq: Once | INTRAMUSCULAR | Status: DC | PRN
Start: 2015-06-14 — End: 2015-06-16

## 2015-06-14 MED ORDER — LORAZEPAM 0.5 MG PO TABS
0.5000 mg | ORAL_TABLET | Freq: Four times a day (QID) | ORAL | Status: DC | PRN
  Administered 2015-06-15: 0.5 mg via ORAL
  Filled 2015-06-14 (×2): qty 1

## 2015-06-14 MED ORDER — LABETALOL HCL 200 MG PO TABS
400.0000 mg | ORAL_TABLET | Freq: Two times a day (BID) | ORAL | Status: DC
  Administered 2015-06-14 – 2015-06-16 (×4): 400 mg via ORAL
  Filled 2015-06-14 (×4): qty 2

## 2015-06-14 MED ORDER — LISINOPRIL 10 MG PO TABS
40.0000 mg | ORAL_TABLET | Freq: Every day | ORAL | Status: DC
  Administered 2015-06-14 – 2015-06-16 (×3): 40 mg via ORAL
  Filled 2015-06-14 (×3): qty 4

## 2015-06-14 MED ORDER — PENTAFLUOROPROP-TETRAFLUOROETH EX AERO
1.0000 | INHALATION_SPRAY | CUTANEOUS | Status: DC | PRN
Start: 2015-06-14 — End: 2015-06-16

## 2015-06-14 MED ORDER — LIDOCAINE-PRILOCAINE 2.5-2.5 % EX CREA: 1.0000 "application " | TOPICAL_CREAM | CUTANEOUS | Status: DC | PRN

## 2015-06-14 NOTE — Procedures (Signed)
   HEMODIALYSIS TREATMENT NOTE:  4 hour heparin-free dialysis completed via right upper arm AVF (16g/antegrade). Goal met: Tolerated removal of 2.6 liters without interruption in ultrafiltration. All blood was reinfused. Hemostasis was achieved within 20 minutes. Report given to Sharene Skeans, RN.  [place], RN, CDN

## 2015-06-14 NOTE — Progress Notes (Addendum)
Subjective: Patient claims her headache is better but still she has some dizziness. She denies any nausea or vomiting.   Objective: Vital signs in last 24 hours: Temp:  [97.4 F (36.3 C)-98.3 F (36.8 C)] 98.3 F (36.8 C) (11/12 0757) Pulse Rate:  [66-84] 67 (11/12 0800) Resp:  [11-23] 11 (11/12 0800) BP: (141-225)/(63-110) 189/75 mmHg (11/12 0800) SpO2:  [99 %-100 %] 100 % (11/12 0800) Weight:  [137 lb 12.6 oz (62.5 kg)] 137 lb 12.6 oz (62.5 kg) (11/12 0400)  Intake/Output from previous day: 11/11 0701 - 11/12 0700 In: 240 [P.O.:240] Out: -  Intake/Output this shift:     Recent Labs  06/12/15 2257 06/13/15 0442 06/14/15 0427  HGB 10.1* 9.8* 9.9*    Recent Labs  06/13/15 0442 06/14/15 0427  WBC 7.6 4.6  RBC 4.27 4.28  HCT 32.3* 31.3*  PLT 369 351    Recent Labs  06/13/15 0442 06/14/15 0427  NA 135 133*  K 4.2 4.6  CL 99* 99*  CO2 26 26  BUN 22* 33*  CREATININE 4.63* 6.13*  GLUCOSE 81 93  CALCIUM 9.3 9.7   No results for input(s): LABPT, INR in the last 72 hours.  She is alert and in no apparent distress Chest is clear to auscultation Heart exam reveals regular rate and rhythm no murmur or S3 Abdomen: Soft positive bowel sounds Extremities no edema  Assessment/Plan: Problem #1 accelerated hypertension: Her blood pressure still high but is much better. Presently she is on labetalol 300 mg by mouth twice a day, nifedipine XL 90 mg by mouth daily and clonidine 0.3 mg by mouth 3 times a day and hydralazine. Problem #2 anemia: Her hemoglobin and hematocrit is below our target goal but stable. Problem #3 end-stage renal disease: She is status post hemodialysis on Thursday. Presently she doesn't have any uremic signs and symptoms. Problem #4 fluid management: Patient doesn't have any significant sign of fluid overload Problem #5 metabolic bone disease: Her calcium and phosphorus is range Problem #6 hypothyroidism Plan: We'll just add Lisinopril 40 mg by  mouth daily  2] we'll increase labetalol to 400 mg by mouth twice a day 3] we'll make arrangements for patient to get dialysis today    [facility] S 06/14/2015, 8:31 AM

## 2015-06-14 NOTE — Progress Notes (Signed)
[facility] PROGRESS NOTE  TOMEKI MANGANO [id number] DOB: [birth year] DOA: 06/12/2015 PCP: [name], MD    Code Status: Full code Family Communication: Discussed with patient. Discussed with husband on 06/13/15. Disposition Plan: Discharge to SNF when clinically appropriate, possibly in the next 48 hours.   Consultants:  Nephrology  Procedures:  Hemodialysis  Antibiotics:  None  HPI/Subjective: Patient denies headache, overall feels better. She does occasionally have vertigo. Meclizine was ordered but she says is not as effective as it was before. She requests as needed lorazepam which has helped in the past. She denies chest pain or shortness of breath.  Objective: Filed Vitals:   06/14/15 0636  BP: 200/80  Pulse:   Temp:   Resp:    temperature 97.4. Pulse 67. Respiratory rate 19. Blood pressure 200/80. Oxygen saturation 100% on room air.  Intake/Output Summary (Last 24 hours) at 06/14/15 0746 Last data filed at 06/13/15 1700  Gross per 24 hour  Intake    240 ml  Output      0 ml  Net    240 ml   Filed Weights   06/13/15 0122 06/13/15 0500 06/14/15 0400  Weight: 60.8 kg (134 lb 0.6 oz) 60.8 kg (134 lb 0.6 oz) 62.5 kg (137 lb 12.6 oz)    Exam:   General:  Pleasant alert 64 year old African-American woman in no acute distress.  Cardiovascular: S1, S2, with a 2/6 systolic murmur.  Respiratory: Clear anteriorly with decreased breath sounds in the bases. Breathing nonlabored.  Abdomen: Positive bowel sounds, soft, nontender, nondistended.  Musculoskeletal/extremities: No acute hot red joints. Trace of pedal edema bilaterally. Left hip staples are in place with no surrounding erythema or drainage.  Neurologic: The patient is alert and oriented 3. Cranial nerves II through XII are intact with exception of decrease in global visual acuity. Sensation grossly intact upper and lower extremities. Her speech is clear.   Data Reviewed: Basic  Metabolic Panel:  Recent Labs Lab 06/12/15 2257 06/13/15 0442 06/14/15 0427  NA 135 135 133*  K 4.5 4.2 4.6  CL 98* 99* 99*  CO2 27 26 26   GLUCOSE 92 81 93  BUN 20 22* 33*  CREATININE 4.25* 4.63* 6.13*  CALCIUM 9.3 9.3 9.7  PHOS  --   --  4.6   Liver Function Tests: No results for input(s): AST, ALT, ALKPHOS, BILITOT, PROT, ALBUMIN in the last 168 hours. No results for input(s): LIPASE, AMYLASE in the last 168 hours. No results for input(s): AMMONIA in the last 168 hours. CBC:  Recent Labs Lab 06/12/15 2257 06/13/15 0442 06/14/15 0427  WBC 7.1 7.6 4.6  NEUTROABS 4.3  --   --   HGB 10.1* 9.8* 9.9*  HCT 32.8* 32.3* 31.3*  MCV 75.4* 75.6* 73.1*  PLT 303 369 351   Cardiac Enzymes: No results for input(s): CKTOTAL, CKMB, CKMBINDEX, TROPONINI in the last 168 hours. BNP (last 3 results)  Recent Labs  10/14/14 1608  BNP 1512.0*    ProBNP (last 3 results) No results for input(s): PROBNP in the last 8760 hours.  CBG: No results for input(s): GLUCAP in the last 168 hours.  Recent Results (from the past 240 hour(s))  Rapid strep screen     Status: None   Collection Time: 06/12/15  7:22 PM  Result Value Ref Range Status   Streptococcus, Group A Screen (Direct) NEGATIVE NEGATIVE Final    Comment: (NOTE) A Rapid Antigen test may result negative if the antigen level in the sample is  below the detection level of this test. The FDA has not cleared this test as a stand-alone test therefore the rapid antigen negative result has reflexed to a Group A Strep culture.      Studies: Ct Head Wo Contrast  06/12/2015  CLINICAL DATA:  64 year old female with headache and elevated blood pressure post dialysis. Subsequent encounter. EXAM: CT HEAD WITHOUT CONTRAST TECHNIQUE: Contiguous axial images were obtained from the base of the skull through the vertex without intravenous contrast. COMPARISON:  06/03/2015 head CT.  11/25/2014 brain MR. FINDINGS: No intracranial hemorrhage.  Prominent small vessel disease type changes. Remote left thalamic infarct. No CT evidence of large acute infarct. 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 Global atrophy without hydrocephalus. Vascular calcifications. IMPRESSION: No intracranial hemorrhage. Prominent small vessel disease type changes. Remote left thalamic infarct. No CT evidence of large acute infarct. 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 Global atrophy without hydrocephalus. Electronically Signed   By: Genia Del M.D.   On: 06/12/2015 16:25    Scheduled Meds: . allopurinol  300 mg Oral Daily  . cloNIDine  0.3 mg Oral TID  . hydrALAZINE  100 mg Oral Q6H  . labetalol  300 mg Oral BID  . levothyroxine  50 mcg Oral QAC breakfast  . NIFEdipine  90 mg Oral Daily  . pantoprazole  40 mg Oral Daily  . sodium chloride  3 mL Intravenous Q12H   Continuous Infusions:   Assessment and plan: Principal Problem:   Accelerated hypertension Active Problems:   HTN (hypertension), malignant   HA (headache)   Anemia in chronic kidney disease   End stage renal disease (HCC)   Closed fracture of neck of left femur (HCC)   Hypothyroidism, adult    1. Accelerated hypertension/malignant hypertension. Patient has had malignant hypertension for years, but it seems that it is gotten worse over the past year to according to the patient. She is not sure if she has had additional evaluation for why her blood pressure has been so high. Per review of her home medications, she takes clonidine, hydralazine, labetalol, and Procardia. On admission, her systolic blood pressure ranged from 210 up to 0000000 with a diastolic blood pressure that ranged from 91-108. She was given multiple doses of IV antihypertensive medications in the ED. She was subsequently started on a Cardene drip. -The Cardene drip was titrated off on 11/11. All of her home medications were restarted. -IV hydralazine as needed was ordered. -Her  systolic blood pressure is better, but is still periodically in the 200s. -We'll continue current management. -Hemodialysis will likely help with her hypertension. -We'll ask Dr. Lowanda Foster about any history of a previous workup for secondary causes of hypertension in this patient.  Headache with associated vertigo.  She has a history of intermittent headaches and vertigo, likely from malignant hypertension. She is treated with as needed meclizine or as needed lorazepam. On admission, CT scan of her head revealed no intracranial hemorrhage, no evidence of large acute infarct, stable large left middle cranial fossa meningocele. She was started on as needed oxycodone and as needed IV morphine for pain. Meclizine was added as needed. Lorazepam will be started as needed for vertigo as well. -Her symptoms have subsided. Neurologically, she does not have any focal findings.  End-stage renal disease. Nephrology was consulted. The patient will undergo hemodialysis under the care of nephrology.  Anemia and chronic kidney disease. The patient's baseline hemoglobin is 9.8-10.8. It is currently stable and at baseline.  Hypothyroidism. The patient was continued on Synthroid. Her TSH is within normal limits at 0.48.  Recent left hip fracture, 05/2015. The patient underwent operative repair with ORIF on 05/31/15 by Dr. Erlinda Hong. Jodell Cipro are still in place, will review her chart more to see when they need to be taken out. She was receiving physical therapy at [facility] before admission. She has minimal pain. -When her blood pressure has improved, will order physical therapy evaluation and management. -For now, will ask the nursing staff to get her out of bed to the chair.  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 Oneida Alar on 05/23/15. Patient was continued on Protonix. Currently stable.    Time spent: 35 minutes.    [facility]  [facility] Pager [phone]. If 7PM-7AM, please contact night-coverage at [email or web address], password [facility] 06/14/2015, 7:46 AM

## 2015-06-15 DIAGNOSIS — I10 Essential (primary) hypertension: Secondary | ICD-10-CM | POA: Diagnosis not present

## 2015-06-15 DIAGNOSIS — G441 Vascular headache, not elsewhere classified: Secondary | ICD-10-CM | POA: Diagnosis not present

## 2015-06-15 DIAGNOSIS — N186 End stage renal disease: Secondary | ICD-10-CM | POA: Diagnosis not present

## 2015-06-15 DIAGNOSIS — N189 Chronic kidney disease, unspecified: Secondary | ICD-10-CM | POA: Diagnosis not present

## 2015-06-15 DIAGNOSIS — Z992 Dependence on renal dialysis: Secondary | ICD-10-CM | POA: Diagnosis not present

## 2015-06-15 DIAGNOSIS — D631 Anemia in chronic kidney disease: Secondary | ICD-10-CM | POA: Diagnosis not present

## 2015-06-15 LAB — CULTURE, GROUP A STREP: STREP A CULTURE: NEGATIVE

## 2015-06-15 MED ORDER — ALLOPURINOL 300 MG PO TABS
150.0000 mg | ORAL_TABLET | Freq: Every day | ORAL | Status: DC
  Administered 2015-06-16: 150 mg via ORAL
  Filled 2015-06-15: qty 1

## 2015-06-15 MED ORDER — CLONIDINE HCL 0.3 MG/24HR TD PTWK
0.3000 mg | MEDICATED_PATCH | TRANSDERMAL | Status: DC
  Administered 2015-06-15: 0.3 mg via TRANSDERMAL
  Filled 2015-06-15: qty 1

## 2015-06-15 NOTE — Progress Notes (Addendum)
[facility] PROGRESS NOTE  Sonya [name] [id number] DOB: May 29, 1951 DOA: 06/12/2015 PCP: [name], MD    Code Status: Full code Family Communication: Discussed with patient. Discussed with husband on 06/13/15. Disposition Plan: Discharge to SNF when clinically appropriate, possibly in the next 24 hours.   Consultants:  Nephrology  Procedures:  Hemodialysis  Antibiotics:  None  HPI/Subjective: Patient complained of mild left hip pain when she got up to the bedside commode. No complaints of chest pain, shortness of breath, or headache.  Objective: Filed Vitals:   06/15/15 1000  BP: 145/66  Pulse: 63  Temp:   Resp: 20   temperature 97.4. Oxygen saturation 100%.  Intake/Output Summary (Last 24 hours) at 06/15/15 1024 Last data filed at 06/14/15 1650  Gross per 24 hour  Intake    240 ml  Output   2600 ml  Net  -2360 ml   Filed Weights   06/14/15 1245 06/14/15 1700 06/15/15 0500  Weight: 62.6 kg (138 lb 0.1 oz) 60.1 kg (132 lb 7.9 oz) 62.4 kg (137 lb 9.1 oz)    Exam:   General:  Pleasant alert 64 year old African-American woman in no acute distress.  Cardiovascular: S1, S2, with a [id number] systolic murmur.  Respiratory: Clear anteriorly with decreased breath sounds in the bases. Breathing nonlabored.  Abdomen: Positive bowel sounds, soft, nontender, nondistended.  Musculoskeletal/extremities: No acute hot red joints. Trace of pedal edema bilaterally. Left hip staples are in place with no surrounding erythema or drainage; mild tenderness..  Neurologic: The patient is alert and oriented 3. Speech is clear.  Data Reviewed: Basic Metabolic Panel:  Recent Labs Lab 06/12/15 2257 06/13/15 0442 06/14/15 0427  NA 135 135 133*  K 4.5 4.2 4.6  CL 98* 99* 99*  CO2 27 26 26   GLUCOSE 92 81 93  BUN 20 22* 33*  CREATININE 4.25* 4.63* 6.13*  CALCIUM 9.3 9.3 9.7  MG  --   --  1.9  PHOS  --   --  4.6   Liver Function Tests: No results for  input(s): AST, ALT, ALKPHOS, BILITOT, PROT, ALBUMIN in the last 168 hours. No results for input(s): LIPASE, AMYLASE in the last 168 hours. No results for input(s): AMMONIA in the last 168 hours. CBC:  Recent Labs Lab 06/12/15 2257 06/13/15 0442 06/14/15 0427  WBC 7.1 7.6 4.6  NEUTROABS 4.3  --   --   HGB 10.1* 9.8* 9.9*  HCT 32.8* 32.3* 31.3*  MCV 75.4* 75.6* 73.1*  PLT 303 369 351   Cardiac Enzymes: No results for input(s): CKTOTAL, CKMB, CKMBINDEX, TROPONINI in the last 168 hours. BNP (last 3 results)  Recent Labs  10/14/14 1608  BNP 1512.0*    ProBNP (last 3 results) No results for input(s): PROBNP in the last 8760 hours.  CBG: No results for input(s): GLUCAP in the last 168 hours.  Recent Results (from the past 240 hour(s))  Rapid strep screen     Status: None   Collection Time: 06/12/15  7:22 PM  Result Value Ref Range Status   Streptococcus, Group A Screen (Direct) NEGATIVE NEGATIVE Final    Comment: (NOTE) A Rapid Antigen test may result negative if the antigen level in the sample is below the detection level of this test. The FDA has not cleared this test as a stand-alone test therefore the rapid antigen negative result has reflexed to a Group A Strep culture.      Studies: No results found.  Scheduled Meds: . [START ON 06/16/2015]  allopurinol  150 mg Oral Daily  . cloNIDine  0.3 mg Transdermal Weekly  . hydrALAZINE  100 mg Oral Q6H  . labetalol  400 mg Oral BID  . levothyroxine  50 mcg Oral QAC breakfast  . lisinopril  40 mg Oral Daily  . NIFEdipine  90 mg Oral Daily  . pantoprazole  40 mg Oral Daily  . sodium chloride  3 mL Intravenous Q12H   Continuous Infusions:   Assessment and plan: Principal Problem:   Accelerated hypertension Active Problems:   HTN (hypertension), malignant   HA (headache)   Anemia in chronic kidney disease   End stage renal disease (HCC)   Closed fracture of neck of left femur (HCC)   Hypothyroidism,  adult    1. Accelerated hypertension/malignant hypertension. Patient has had malignant hypertension for years, but it seems that it is gotten worse over the past year to according to the patient. She is not sure if she has had additional evaluation for why her blood pressure has been so high. Per review of her home medications, she takes clonidine, hydralazine, labetalol, and Procardia. On admission, her systolic blood pressure ranged from 210 up to 0000000 with a diastolic blood pressure that ranged from 91-108. She was given multiple doses of IV antihypertensive medications in the ED. She was subsequently started on a Cardene drip. -The Cardene drip was titrated off on 11/11. All of her home medications were restarted. -IV hydralazine as needed was ordered. -Nephrologist, Dr. Lowanda Foster was consulted. He does not recall any previous workup of secondary causes of hypertension in this patient, but at this point, assessment of secondary etiologies may not be diagnostic at this point due to her end-stage renal disease. -Dr. Lowanda Foster added lisinopril and increase the dose of labetalol; he also change clonidine to transdermal. The patient's blood pressure has improved progressively.  Headache with associated vertigo. Patient has a history of intermittent headaches and vertigo, likely from malignant hypertension. She is treated with as needed meclizine or as needed lorazepam. On admission, CT scan of her head revealed no intracranial hemorrhage, no evidence of large acute infarct, stable large left middle cranial fossa meningocele. She was started on as needed oxycodone and as needed IV morphine for pain. Meclizine and lorazepam were restarted as needed. -Her symptoms have subsided. Neurologically, she does not have any focal findings.  End-stage renal disease. Nephrology was consulted. The patient will undergo hemodialysis under the care of nephrology.  Anemia and chronic kidney disease. The patient's  baseline hemoglobin is 9.8-10.8. It is currently stable and at baseline.  Hypothyroidism. The patient was continued on Synthroid. Her TSH was within normal limits at 0.48.  Recent left hip fracture, 05/2015. The patient underwent operative repair with ORIF on 05/31/15 by Dr. Erlinda Hong. Jodell Cipro are still in place, will review her chart more to see when they need to be taken out. She was receiving physical therapy at [facility] before admission. She has minimal pain. -Now that her blood pressure has improved, will order physical therapy evaluation and management.   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 Oneida Alar on 05/23/15. Patient was continued on Protonix. Currently stable.    Time spent: 30 minutes.    [place] [facility] Pager [phone]. If 7PM-7AM, please contact night-coverage at [email or web address], password [facility] 06/15/2015, 10:24 AM

## 2015-06-15 NOTE — Progress Notes (Signed)
Called report to Sonya Aris, RN on dept 300. Verbalized understanding. Pt transferred to room 314 in safe and stable condition.

## 2015-06-15 NOTE — Progress Notes (Signed)
Subjective: Patient does not have any more headache. She has some pain of her hip otherwise feels much better.   Objective: Vital signs in last 24 hours: Temp:  [97 F (36.1 C)-98.1 F (36.7 C)] 97.4 F (36.3 C) (11/13 0833) Pulse Rate:  [61-73] 63 (11/13 0800) Resp:  [12-23] 14 (11/13 0800) BP: (131-179)/(64-81) 160/74 mmHg (11/13 0800) SpO2:  [99 %-100 %] 99 % (11/13 0800) Weight:  [132 lb 7.9 oz (60.1 kg)-138 lb 0.1 oz (62.6 kg)] 137 lb 9.1 oz (62.4 kg) (11/13 0500)  Intake/Output from previous day: 11/12 0701 - 11/13 0700 In: 560 [P.O.:560] Out: 2600  Intake/Output this shift:     Recent Labs  06/12/15 2257 06/13/15 0442 06/14/15 0427  HGB 10.1* 9.8* 9.9*    Recent Labs  06/13/15 0442 06/14/15 0427  WBC 7.6 4.6  RBC 4.27 4.28  HCT 32.3* 31.3*  PLT 369 351    Recent Labs  06/13/15 0442 06/14/15 0427  NA 135 133*  K 4.2 4.6  CL 99* 99*  CO2 26 26  BUN 22* 33*  CREATININE 4.63* 6.13*  GLUCOSE 81 93  CALCIUM 9.3 9.7   No results for input(s): LABPT, INR in the last 72 hours.  She is alert and in no apparent distress Chest is clear to auscultation Heart exam reveals regular rate and rhythm no murmur or S3 Abdomen: Soft positive bowel sounds Extremities no edema  Assessment/Plan: Problem #1 accelerated hypertension: Her blood pressure seems to be improving with addition of lisinopril and decreasing her labetalol. Problem #2 anemia: Her hemoglobin and hematocrit is below our target goal but stable. Patient is on Epogen. Problem #3 end-stage renal disease: She is status post hemodialysis yesterday. Presently she is asymptomatic. Problem #4 fluid management: Patient doesn't have any significant sign of fluid overload. We're able to remove about 2 and half liters. Problem #5 metabolic bone disease: Her calcium and phosphorus is range Problem #6 hypothyroidism Plan: 1: We'll continue his present management 2] we'll change Catapres to a patch. 3]  Dialysis will be on Tuesday.    Taisley Mordan S 06/15/2015, 9:40 AM

## 2015-06-16 ENCOUNTER — Encounter (HOSPITAL_COMMUNITY): Payer: Self-pay | Admitting: Internal Medicine

## 2015-06-16 DIAGNOSIS — M79651 Pain in right thigh: Secondary | ICD-10-CM | POA: Diagnosis not present

## 2015-06-16 DIAGNOSIS — G9340 Encephalopathy, unspecified: Secondary | ICD-10-CM | POA: Diagnosis not present

## 2015-06-16 DIAGNOSIS — Z8781 Personal history of (healed) traumatic fracture: Secondary | ICD-10-CM | POA: Diagnosis not present

## 2015-06-16 DIAGNOSIS — N186 End stage renal disease: Secondary | ICD-10-CM | POA: Diagnosis not present

## 2015-06-16 DIAGNOSIS — R451 Restlessness and agitation: Secondary | ICD-10-CM | POA: Diagnosis not present

## 2015-06-16 DIAGNOSIS — M79601 Pain in right arm: Secondary | ICD-10-CM | POA: Diagnosis not present

## 2015-06-16 DIAGNOSIS — Y9389 Activity, other specified: Secondary | ICD-10-CM | POA: Diagnosis not present

## 2015-06-16 DIAGNOSIS — R112 Nausea with vomiting, unspecified: Secondary | ICD-10-CM | POA: Diagnosis not present

## 2015-06-16 DIAGNOSIS — Y998 Other external cause status: Secondary | ICD-10-CM | POA: Diagnosis not present

## 2015-06-16 DIAGNOSIS — R51 Headache: Secondary | ICD-10-CM | POA: Diagnosis not present

## 2015-06-16 DIAGNOSIS — H538 Other visual disturbances: Secondary | ICD-10-CM | POA: Diagnosis not present

## 2015-06-16 DIAGNOSIS — R1310 Dysphagia, unspecified: Secondary | ICD-10-CM | POA: Diagnosis not present

## 2015-06-16 DIAGNOSIS — E875 Hyperkalemia: Secondary | ICD-10-CM | POA: Diagnosis not present

## 2015-06-16 DIAGNOSIS — Q019 Encephalocele, unspecified: Secondary | ICD-10-CM | POA: Diagnosis not present

## 2015-06-16 DIAGNOSIS — K5900 Constipation, unspecified: Secondary | ICD-10-CM | POA: Diagnosis not present

## 2015-06-16 DIAGNOSIS — N189 Chronic kidney disease, unspecified: Secondary | ICD-10-CM | POA: Diagnosis not present

## 2015-06-16 DIAGNOSIS — J810 Acute pulmonary edema: Secondary | ICD-10-CM | POA: Diagnosis not present

## 2015-06-16 DIAGNOSIS — S098XXA Other specified injuries of head, initial encounter: Secondary | ICD-10-CM | POA: Diagnosis not present

## 2015-06-16 DIAGNOSIS — Z8542 Personal history of malignant neoplasm of other parts of uterus: Secondary | ICD-10-CM | POA: Diagnosis not present

## 2015-06-16 DIAGNOSIS — Z6822 Body mass index (BMI) 22.0-22.9, adult: Secondary | ICD-10-CM | POA: Diagnosis not present

## 2015-06-16 DIAGNOSIS — Z992 Dependence on renal dialysis: Secondary | ICD-10-CM | POA: Diagnosis not present

## 2015-06-16 DIAGNOSIS — I161 Hypertensive emergency: Secondary | ICD-10-CM | POA: Diagnosis not present

## 2015-06-16 DIAGNOSIS — Z833 Family history of diabetes mellitus: Secondary | ICD-10-CM | POA: Diagnosis not present

## 2015-06-16 DIAGNOSIS — R404 Transient alteration of awareness: Secondary | ICD-10-CM | POA: Diagnosis not present

## 2015-06-16 DIAGNOSIS — I739 Peripheral vascular disease, unspecified: Secondary | ICD-10-CM | POA: Diagnosis not present

## 2015-06-16 DIAGNOSIS — M25551 Pain in right hip: Secondary | ICD-10-CM | POA: Diagnosis not present

## 2015-06-16 DIAGNOSIS — M25552 Pain in left hip: Secondary | ICD-10-CM | POA: Diagnosis not present

## 2015-06-16 DIAGNOSIS — S299XXA Unspecified injury of thorax, initial encounter: Secondary | ICD-10-CM | POA: Diagnosis not present

## 2015-06-16 DIAGNOSIS — Q018 Encephalocele of other sites: Secondary | ICD-10-CM | POA: Diagnosis not present

## 2015-06-16 DIAGNOSIS — K219 Gastro-esophageal reflux disease without esophagitis: Secondary | ICD-10-CM | POA: Diagnosis present

## 2015-06-16 DIAGNOSIS — M109 Gout, unspecified: Secondary | ICD-10-CM | POA: Diagnosis not present

## 2015-06-16 DIAGNOSIS — I509 Heart failure, unspecified: Secondary | ICD-10-CM | POA: Diagnosis not present

## 2015-06-16 DIAGNOSIS — S0003XA Contusion of scalp, initial encounter: Secondary | ICD-10-CM | POA: Diagnosis not present

## 2015-06-16 DIAGNOSIS — Y9289 Other specified places as the place of occurrence of the external cause: Secondary | ICD-10-CM | POA: Diagnosis not present

## 2015-06-16 DIAGNOSIS — J9601 Acute respiratory failure with hypoxia: Secondary | ICD-10-CM | POA: Diagnosis present

## 2015-06-16 DIAGNOSIS — N390 Urinary tract infection, site not specified: Secondary | ICD-10-CM | POA: Diagnosis not present

## 2015-06-16 DIAGNOSIS — K5792 Diverticulitis of intestine, part unspecified, without perforation or abscess without bleeding: Secondary | ICD-10-CM | POA: Diagnosis not present

## 2015-06-16 DIAGNOSIS — R079 Chest pain, unspecified: Secondary | ICD-10-CM | POA: Diagnosis not present

## 2015-06-16 DIAGNOSIS — I5033 Acute on chronic diastolic (congestive) heart failure: Secondary | ICD-10-CM | POA: Diagnosis present

## 2015-06-16 DIAGNOSIS — R279 Unspecified lack of coordination: Secondary | ICD-10-CM | POA: Diagnosis not present

## 2015-06-16 DIAGNOSIS — I150 Renovascular hypertension: Secondary | ICD-10-CM | POA: Diagnosis not present

## 2015-06-16 DIAGNOSIS — E43 Unspecified severe protein-calorie malnutrition: Secondary | ICD-10-CM | POA: Diagnosis present

## 2015-06-16 DIAGNOSIS — S0083XA Contusion of other part of head, initial encounter: Secondary | ICD-10-CM | POA: Diagnosis not present

## 2015-06-16 DIAGNOSIS — Z8249 Family history of ischemic heart disease and other diseases of the circulatory system: Secondary | ICD-10-CM | POA: Diagnosis not present

## 2015-06-16 DIAGNOSIS — S72002D Fracture of unspecified part of neck of left femur, subsequent encounter for closed fracture with routine healing: Secondary | ICD-10-CM | POA: Diagnosis not present

## 2015-06-16 DIAGNOSIS — Z8669 Personal history of other diseases of the nervous system and sense organs: Secondary | ICD-10-CM | POA: Diagnosis not present

## 2015-06-16 DIAGNOSIS — I6789 Other cerebrovascular disease: Secondary | ICD-10-CM | POA: Diagnosis not present

## 2015-06-16 DIAGNOSIS — E876 Hypokalemia: Secondary | ICD-10-CM | POA: Diagnosis present

## 2015-06-16 DIAGNOSIS — Z79899 Other long term (current) drug therapy: Secondary | ICD-10-CM | POA: Diagnosis not present

## 2015-06-16 DIAGNOSIS — I10 Essential (primary) hypertension: Secondary | ICD-10-CM | POA: Diagnosis not present

## 2015-06-16 DIAGNOSIS — R05 Cough: Secondary | ICD-10-CM | POA: Diagnosis not present

## 2015-06-16 DIAGNOSIS — H532 Diplopia: Secondary | ICD-10-CM | POA: Diagnosis not present

## 2015-06-16 DIAGNOSIS — I619 Nontraumatic intracerebral hemorrhage, unspecified: Secondary | ICD-10-CM | POA: Diagnosis not present

## 2015-06-16 DIAGNOSIS — R102 Pelvic and perineal pain: Secondary | ICD-10-CM | POA: Diagnosis not present

## 2015-06-16 DIAGNOSIS — Q059 Spina bifida, unspecified: Secondary | ICD-10-CM | POA: Diagnosis not present

## 2015-06-16 DIAGNOSIS — R410 Disorientation, unspecified: Secondary | ICD-10-CM | POA: Diagnosis not present

## 2015-06-16 DIAGNOSIS — D509 Iron deficiency anemia, unspecified: Secondary | ICD-10-CM | POA: Diagnosis not present

## 2015-06-16 DIAGNOSIS — G441 Vascular headache, not elsewhere classified: Secondary | ICD-10-CM | POA: Diagnosis not present

## 2015-06-16 DIAGNOSIS — F411 Generalized anxiety disorder: Secondary | ICD-10-CM | POA: Diagnosis not present

## 2015-06-16 DIAGNOSIS — I951 Orthostatic hypotension: Secondary | ICD-10-CM | POA: Diagnosis not present

## 2015-06-16 DIAGNOSIS — R1110 Vomiting, unspecified: Secondary | ICD-10-CM | POA: Diagnosis not present

## 2015-06-16 DIAGNOSIS — W19XXXA Unspecified fall, initial encounter: Secondary | ICD-10-CM | POA: Diagnosis not present

## 2015-06-16 DIAGNOSIS — S0990XA Unspecified injury of head, initial encounter: Secondary | ICD-10-CM | POA: Diagnosis present

## 2015-06-16 DIAGNOSIS — I674 Hypertensive encephalopathy: Secondary | ICD-10-CM | POA: Diagnosis present

## 2015-06-16 DIAGNOSIS — R42 Dizziness and giddiness: Secondary | ICD-10-CM | POA: Diagnosis not present

## 2015-06-16 DIAGNOSIS — I110 Hypertensive heart disease with heart failure: Secondary | ICD-10-CM | POA: Diagnosis not present

## 2015-06-16 DIAGNOSIS — Z862 Personal history of diseases of the blood and blood-forming organs and certain disorders involving the immune mechanism: Secondary | ICD-10-CM | POA: Diagnosis not present

## 2015-06-16 DIAGNOSIS — R531 Weakness: Secondary | ICD-10-CM | POA: Diagnosis not present

## 2015-06-16 DIAGNOSIS — M6281 Muscle weakness (generalized): Secondary | ICD-10-CM | POA: Diagnosis not present

## 2015-06-16 DIAGNOSIS — N2581 Secondary hyperparathyroidism of renal origin: Secondary | ICD-10-CM | POA: Diagnosis not present

## 2015-06-16 DIAGNOSIS — D631 Anemia in chronic kidney disease: Secondary | ICD-10-CM | POA: Diagnosis not present

## 2015-06-16 DIAGNOSIS — Z9889 Other specified postprocedural states: Secondary | ICD-10-CM | POA: Diagnosis not present

## 2015-06-16 DIAGNOSIS — W01198A Fall on same level from slipping, tripping and stumbling with subsequent striking against other object, initial encounter: Secondary | ICD-10-CM | POA: Diagnosis not present

## 2015-06-16 DIAGNOSIS — R262 Difficulty in walking, not elsewhere classified: Secondary | ICD-10-CM | POA: Diagnosis not present

## 2015-06-16 DIAGNOSIS — I132 Hypertensive heart and chronic kidney disease with heart failure and with stage 5 chronic kidney disease, or end stage renal disease: Secondary | ICD-10-CM | POA: Diagnosis present

## 2015-06-16 DIAGNOSIS — I120 Hypertensive chronic kidney disease with stage 5 chronic kidney disease or end stage renal disease: Secondary | ICD-10-CM | POA: Diagnosis not present

## 2015-06-16 DIAGNOSIS — Z743 Need for continuous supervision: Secondary | ICD-10-CM | POA: Diagnosis not present

## 2015-06-16 DIAGNOSIS — E039 Hypothyroidism, unspecified: Secondary | ICD-10-CM | POA: Diagnosis not present

## 2015-06-16 DIAGNOSIS — D649 Anemia, unspecified: Secondary | ICD-10-CM | POA: Diagnosis present

## 2015-06-16 DIAGNOSIS — I160 Hypertensive urgency: Secondary | ICD-10-CM | POA: Diagnosis not present

## 2015-06-16 DIAGNOSIS — J189 Pneumonia, unspecified organism: Secondary | ICD-10-CM | POA: Diagnosis not present

## 2015-06-16 LAB — BASIC METABOLIC PANEL
ANION GAP: 11 (ref 5–15)
BUN: 37 mg/dL — ABNORMAL HIGH (ref 6–20)
CALCIUM: 9.5 mg/dL (ref 8.9–10.3)
CO2: 27 mmol/L (ref 22–32)
Chloride: 93 mmol/L — ABNORMAL LOW (ref 101–111)
Creatinine, Ser: 6.28 mg/dL — ABNORMAL HIGH (ref 0.44–1.00)
GFR calc Af Amer: 7 mL/min — ABNORMAL LOW (ref >60)
GFR, EST NON AFRICAN AMERICAN: 6 mL/min — AB (ref >60)
Glucose, Bld: 99 mg/dL (ref 65–99)
POTASSIUM: 4.7 mmol/L (ref 3.5–5.1)
SODIUM: 131 mmol/L — AB (ref 135–145)

## 2015-06-16 MED ORDER — ALLOPURINOL 300 MG PO TABS: 150.0000 mg | ORAL_TABLET | Freq: Every day | ORAL | Status: DC

## 2015-06-16 MED ORDER — CLONIDINE HCL 0.3 MG/24HR TD PTWK: 0.3000 mg | MEDICATED_PATCH | TRANSDERMAL | Status: DC

## 2015-06-16 MED ORDER — LORAZEPAM 0.5 MG PO TABS: 0.5000 mg | ORAL_TABLET | Freq: Four times a day (QID) | ORAL | Status: DC | PRN

## 2015-06-16 MED ORDER — LISINOPRIL 40 MG PO TABS: 40.0000 mg | ORAL_TABLET | Freq: Every day | ORAL | Status: DC

## 2015-06-16 MED ORDER — LABETALOL HCL 200 MG PO TABS: 400.0000 mg | ORAL_TABLET | Freq: Two times a day (BID) | ORAL | Status: DC

## 2015-06-16 NOTE — Clinical Social Work Note (Signed)
Pt d/c today back to [facility]. Pt, pt's husband, and facility aware and agreeable. Husband to provide transport.  Benay Pike, [place]

## 2015-06-16 NOTE — Progress Notes (Signed)
Subjective: Patient offers no complaint and feels much better.   Objective: Vital signs in last 24 hours: Temp:  [97.8 F (36.6 C)-98.4 F (36.9 C)] 97.8 F (36.6 C) (11/14 0454) Pulse Rate:  [61-81] 70 (11/14 0454) Resp:  [12-20] 16 (11/14 0454) BP: (139-175)/(52-82) 139/52 mmHg (11/14 0454) SpO2:  [97 %-100 %] 97 % (11/13 2029)  Intake/Output from previous day: 11/13 0701 - 11/14 0700 In: 240 [P.O.:240] Out: -  Intake/Output this shift:     Recent Labs  06/14/15 0427  HGB 9.9*    Recent Labs  06/14/15 0427  WBC 4.6  RBC 4.28  HCT 31.3*  PLT 351    Recent Labs  06/14/15 0427  NA 133*  K 4.6  CL 99*  CO2 26  BUN 33*  CREATININE 6.13*  GLUCOSE 93  CALCIUM 9.7   No results for input(s): LABPT, INR in the last 72 hours.  She is alert and in no apparent distress Chest is clear to auscultation Heart exam reveals regular rate and rhythm no murmur or S3 Abdomen: Soft positive bowel sounds Extremities no edema  Assessment/Plan: Problem #1 accelerated hypertension: Her blood pressure is better controlled and no more headache Problem #2 anemia: Her hemoglobin and hematocrit is below our target goal but stable. Patient is on Epogen. Problem #3 end-stage renal disease: She is status post hemodialysis on Saturday. He sis asymptomatic with normal potassium Problem #4 fluid management: Patient doesn't have any significant sign of fluid overload.  Problem #5 metabolic bone disease: Her calcium and phosphorus is range Problem #6 hypothyroidism Plan: 1: We'll check her BMET and CBC in am 2] We will dialyze patient in am if she remains in the hospital other wise she can go to out patient dialysis unit for her regular schedule    Chantilly Linskey S 06/16/2015, 8:33 AM

## 2015-06-16 NOTE — Progress Notes (Signed)
Report called to April, DON at [facility].

## 2015-06-16 NOTE — Care Management Note (Signed)
Case Management Note  Patient Details  Name: Sonya [name] MRN: [id number] Date of Birth: 12/07/1950  Expected Discharge Date:                  Expected Discharge Plan:  Skilled Nursing Facility  In-House Referral:  Clinical Social Work  Discharge planning Services  CM Consult  Post Acute Care Choice:  NA Choice offered to:  NA  DME Arranged:    DME Agency:     HH Arranged:    [place] Agency:     Status of Service:  Completed, signed off  Medicare Important Message Given:    Date Medicare IM Given:    Medicare IM give by:    Date Additional Medicare IM Given:    Additional Medicare Important Message give by:     If discussed at [place] of Stay Meetings, dates discussed:    Additional Comments: Pt discharging to SNF today. CSW has arranged for return to facility. Pt/family/RN aware of DC arrangements. No CM needs. Sherald Barge, RN 06/16/2015, 12:18 PM

## 2015-06-16 NOTE — Discharge Summary (Signed)
Physician Discharge Summary  Sonya [name] YQM:[id number] DOB: 1950-12-30 DOA: 06/12/2015  PCP: [name], MD  Admit date: 06/12/2015 Discharge date: 06/16/2015  Time spent: Greater than 30 minutes  Recommendations for Outpatient Follow-up:  1. Recommend removing left his staples per previous recommendation by her orthopedic surgeon. 2. Recommend monitoring of her blood pressure closely on new regimen of blood pressure medications.  3. Patient will need transport to hemodialysis on Tuesday, Thursday, Saturdays. 4. Patient is being discharged to [facility] skilled nursing facility.   Discharge Diagnoses:  1. Accelerated hypertension/malignant hypertension. 2. Headache associated with vertigo, secondary to accelerated hypertension. 3. History of intracranial bleeding. 4. History of left middle cranial fossa meningocele; stable radiographically. 5. End-stage renal disease. 6. Microcytic anemia and anemia of chronic kidney disease. 7. Hypothyroidism. 8. 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 Darrick Penna. 8. Status post left hip fracture 05/31/15; status post ORIF on 05/31/15 by Dr. Roda Shutters.   Discharge Condition: Improved.  Diet recommendation: Heart healthy.  Filed Weights   06/14/15 1245 06/14/15 1700 06/15/15 0500  Weight: 62.6 kg (138 lb 0.1 oz) 60.1 kg (132 lb 7.9 oz) 62.4 kg (137 lb 9.1 oz)    History of present illness:  The patient is a 64 year old woman with a history of malignant hypertension, end-stage renal disease, hypothyroidism, and recent hip surgery on 05/31/15, who presented from [facility] skilled nursing facility with a chief complaint of headache and dizziness. In the ED, her systolic blood pressure was well over 200. She was admitted for further evaluation and management.  Hospital Course:   1. Accelerated hypertension/malignant hypertension. Patient has had hypertension for years, but it had  gotten worse over the past year to according to the patient. Per review of her home medications, she took clonidine, hydralazine, labetalol, and Procardia. On admission, her systolic blood pressure ranged from 210 up to 243 with a diastolic blood pressure that ranged from 91-108. She was given multiple doses of IV antihypertensive medications in the ED. She was subsequently started on a Cardene drip. -The Cardene drip was titrated off on 11/11. All of her home medications were restarted. -IV hydralazine as needed was ordered. -Nephrologist, Dr. Kristian Covey was consulted. He did not recall any previous workup of secondary causes of hypertension in this patient, but at this point, assessment of secondary etiologies would not be diagnostic due to her end-stage renal disease. -Dr. Kristian Covey added lisinopril and increased the dose of labetalol; he also changed clonidine to transdermal. The patient's blood pressure improved progressively. -Recommend close monitoring of her blood pressure and further adjustments in her medications as needed.  Headache with associated vertigo. Patient has a history of intermittent headaches and vertigo, likely from malignant hypertension. She is treated with as needed meclizine or as needed lorazepam chronically. On admission, CT scan of her head revealed no intracranial hemorrhage, no evidence of large acute infarct, stable large left middle cranial fossa meningocele. She was started on as needed oxycodone and as needed IV morphine for pain. Meclizine and lorazepam were restarted as needed. -Her symptoms have subsided. Neurologically, she did not have any focal findings.  End-stage renal disease. Nephrology was consulted. The patient will underwent hemodialysis under the care of nephrology.  Anemia and chronic kidney disease. The patient's baseline hemoglobin is 9.8-10.8. It is currently stable and at baseline.  Hypothyroidism. The patient was continued on Synthroid. Her  TSH was within normal limits at 0.48.  Recent left hip fracture, 05/2015.  The patient underwent operative repair with ORIF on 05/31/15 by Dr. Lamount Cranker are still in place.  She was receiving physical therapy at [facility] before admission. She has minimal pain. -She will return to [facility] for ongoing physical therapy.  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 Darrick Penna on 05/23/15. Patient was continued on Protonix. Currently stable.   Procedures:  Hemodialysis  Consultations:  Nephrology, Dr. Kristian Covey  Discharge Exam: Filed Vitals:   06/16/15 1059  BP: 133/62  Pulse: 65  Temp:   Resp: 18   temperature 97.8. Oxygen saturation 100% on room air.  General: Pleasant alert 64 year old African-American woman in no acute distress.  Cardiovascular: S1, S2, with a 1-2/6 systolic murmur.  Respiratory: Clear anteriorly with decreased breath sounds in the bases. Breathing nonlabored.  Abdomen: Positive bowel sounds, soft, nontender, nondistended.  Musculoskeletal/extremities: No acute hot red joints. Trace of pedal edema bilaterally. Left hip staples are in place with no surrounding erythema or drainage; mild tenderness..  Neurologic: The patient is alert and oriented 3. Speech is clear.  Discharge Instructions   Discharge Instructions    Diet - low sodium heart healthy    Complete by:  As directed      Discharge instructions    Complete by:  As directed   Remove the left hip staples per previous recommendation by patient's orthopedic surgeon.     Increase activity slowly    Complete by:  As directed           Current Discharge Medication List    START taking these medications   Details  cloNIDine (CATAPRES - DOSED IN MG/24 HR) 0.3 mg/24hr patch Place 1 patch (0.3 mg total) onto the skin once a week.    lisinopril (PRINIVIL,ZESTRIL) 40 MG tablet Take 1 tablet (40 mg total) by mouth daily.    LORazepam (ATIVAN) 0.5  MG tablet Take 1-2 tablets (0.5-1 mg total) by mouth every 6 (six) hours as needed for anxiety or sleep (START WITH 0.5 FIRST; WHEN NECESSARY FOR VERTIGO/DIZZINESS; also prn anxiety and sleep). Qty: 30 tablet, Refills: 0      CONTINUE these medications which have CHANGED   Details  allopurinol (ZYLOPRIM) 300 MG tablet Take 0.5 tablets (150 mg total) by mouth daily. Dose was decreased secondary to renal failure.    labetalol (NORMODYNE) 200 MG tablet Take 2 tablets (400 mg total) by mouth 2 (two) times daily.      CONTINUE these medications which have NOT CHANGED   Details  enoxaparin (LOVENOX) 30 MG/0.3ML injection Inject 0.3 mLs (30 mg total) into the skin daily. Qty: 28 Syringe, Refills: 0    hydrALAZINE (APRESOLINE) 100 MG tablet Take 1 tablet (100 mg total) by mouth every 6 (six) hours.    HYDROcodone-acetaminophen (NORCO) 7.5-325 MG tablet Take 1-2 tablets by mouth every 6 (six) hours as needed for moderate pain. Qty: 90 tablet, Refills: 0    levothyroxine (SYNTHROID, LEVOTHROID) 50 MCG tablet Take 1 tablet (50 mcg total) by mouth daily. Qty: 30 tablet, Refills: 0    lidocaine-prilocaine (EMLA) cream Apply 1 application topically as needed (on Dialysis days Tues,Thurs.,Sat.).    metoCLOPramide (REGLAN) 5 MG tablet 1 po before breakfast and lunch and at bedtime. Qty: 90 tablet, Refills: 11    NIFEdipine (PROCARDIA XL/ADALAT-CC) 90 MG 24 hr tablet Take 90 mg by mouth daily.    omeprazole (PRILOSEC) 20 MG capsule Take 1 capsule (20 mg total) by mouth daily. Qty:  31 capsule, Refills: 5    senna-docusate (SENOKOT-S) 8.6-50 MG tablet Take 1 tablet by mouth 2 (two) times daily as needed for mild constipation.    alendronate (FOSAMAX) 70 MG tablet Take 70 mg by mouth once a week.     meclizine (ANTIVERT) 25 MG tablet Take 1 tablet by mouth every 6 (six) hours as needed for dizziness. dizziness      STOP taking these medications     cloNIDine (CATAPRES) 0.3 MG tablet         Allergies  Allergen Reactions  . Cephalexin Itching      The results of significant diagnostics from this hospitalization (including imaging, microbiology, ancillary and laboratory) are listed below for reference.    Significant Diagnostic Studies: Dg Chest 1 View  05/30/2015  CLINICAL DATA:  Fall EXAM: CHEST 1 VIEW COMPARISON:  April 14, 2015 FINDINGS: There is no edema or consolidation. Heart is enlarged with pulmonary vascularity within normal limits. No pneumothorax. No adenopathy. No bone lesions. IMPRESSION: Cardiomegaly.  No edema or consolidation. Electronically Signed   By: Bretta Bang III M.D.   On: 05/30/2015 21:38   Ct Head Wo Contrast  06/12/2015  CLINICAL DATA:  64 year old female with headache and elevated blood pressure post dialysis. Subsequent encounter. EXAM: CT HEAD WITHOUT CONTRAST TECHNIQUE: Contiguous axial images were obtained from the base of the skull through the vertex without intravenous contrast. COMPARISON:  06/03/2015 head CT.  11/25/2014 brain MR. FINDINGS: No intracranial hemorrhage. Prominent small vessel disease type changes. Remote left thalamic infarct. No CT evidence of large acute infarct. 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 Global atrophy without hydrocephalus. Vascular calcifications. IMPRESSION: No intracranial hemorrhage. Prominent small vessel disease type changes. Remote left thalamic infarct. No CT evidence of large acute infarct. 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 Global atrophy without hydrocephalus. Electronically Signed   By: Lacy Duverney M.D.   On: 06/12/2015 16:25   Ct Head Wo Contrast  06/03/2015  CLINICAL DATA:  Disoriented status post fall 3 days ago. No reported loss of consciousness. EXAM: CT HEAD WITHOUT CONTRAST TECHNIQUE: Contiguous axial images were obtained from the base of the skull through the vertex without intravenous contrast. COMPARISON:  CT scan of April 16, 2015. FINDINGS: Bony calvarium appears intact. Mild chronic ischemic white matter disease is noted. Stable meningocele is seen involving the left sphenoid sinus and middle cranial fossa. No mass effect or midline shift is noted. Ventricular size is within normal limits. No evidence of mass lesion, hemorrhage or acute infarction is noted. IMPRESSION: Mild diffuse cortical atrophy is noted. Stable left middle cranial fossa meningocele. No significant change compared to prior exam. Electronically Signed   By: Lupita Raider, M.D.   On: 06/03/2015 15:24   Dg C-arm 1-60 Min  05/31/2015  CLINICAL DATA:  Pinning of left femoral neck fracture. EXAM: DG C-ARM 61-120 MIN; LEFT FEMUR 2 VIEWS COMPARISON:  None. FINDINGS: Intraoperative images were obtained demonstrating placement of a total of 3 separate cannulated pins across the femoral neck which appear in good position. There is no evidence of significant displacement or angulation across the left femoral neck fracture. IMPRESSION: Near anatomic alignment with good positioning of 3 cannulated pins across the left femoral neck fracture. Electronically Signed   By: Irish Lack M.D.   On: 05/31/2015 09:39   Dg Hip Unilat With Pelvis 2-3 Views Left  05/30/2015  CLINICAL DATA:  Pain following fall EXAM: DG HIP (WITH OR WITHOUT PELVIS)  2-3V LEFT COMPARISON:  None. FINDINGS: Frontal pelvis as well as frontal and lateral left hip images were obtained. There is a fracture of the left femoral neck which extends to the subcapital region laterally with slight impaction along the lateral aspect of the subcapital femoral neck region on the left. No other fracture is apparent. No dislocation. There is mild symmetric narrowing of both hip joints. There are scattered foci of arterial vascular calcification bilaterally. IMPRESSION: Nondisplaced fracture of the left femoral neck with mild impaction at the subcapital femoral neck region with laterally on the left. No other  fracture. No dislocation. There is mild symmetric narrowing of both hip joints. Electronically Signed   By: Bretta Bang III M.D.   On: 05/30/2015 20:44   Dg Femur Min 2 Views Left  05/31/2015  CLINICAL DATA:  Pinning of left femoral neck fracture. EXAM: DG C-ARM 61-120 MIN; LEFT FEMUR 2 VIEWS COMPARISON:  None. FINDINGS: Intraoperative images were obtained demonstrating placement of a total of 3 separate cannulated pins across the femoral neck which appear in good position. There is no evidence of significant displacement or angulation across the left femoral neck fracture. IMPRESSION: Near anatomic alignment with good positioning of 3 cannulated pins across the left femoral neck fracture. Electronically Signed   By: Irish Lack M.D.   On: 05/31/2015 09:39    Microbiology: Recent Results (from the past 240 hour(s))  Rapid strep screen     Status: None   Collection Time: 06/12/15  7:22 PM  Result Value Ref Range Status   Streptococcus, Group A Screen (Direct) NEGATIVE NEGATIVE Final    Comment: (NOTE) A Rapid Antigen test may result negative if the antigen level in the sample is below the detection level of this test. The FDA has not cleared this test as a stand-alone test therefore the rapid antigen negative result has reflexed to a Group A Strep culture.   Culture, Group A Strep     Status: None   Collection Time: 06/12/15  7:22 PM  Result Value Ref Range Status   Strep A Culture Negative  Final    Comment: (NOTE) Performed At: [facility] [address] [place], [place] [id number] Mila Homer MD OV:[id number]      Labs: Basic Metabolic Panel:  Recent Labs Lab 06/12/15 2257 06/13/15 0442 06/14/15 0427 06/16/15 0759  NA 135 135 133* 131*  K 4.5 4.2 4.6 4.7  CL 98* 99* 99* 93*  CO2 27 26 26 27   GLUCOSE 92 81 93 99  BUN 20 22* 33* 37*  CREATININE 4.25* 4.63* 6.13* 6.28*  CALCIUM 9.3 9.3 9.7 9.5  MG  --   --  1.9  --   PHOS  --   --  4.6  --     Liver Function Tests: No results for input(s): AST, ALT, ALKPHOS, BILITOT, PROT, ALBUMIN in the last 168 hours. No results for input(s): LIPASE, AMYLASE in the last 168 hours. No results for input(s): AMMONIA in the last 168 hours. CBC:  Recent Labs Lab 06/12/15 2257 06/13/15 0442 06/14/15 0427  WBC 7.1 7.6 4.6  NEUTROABS 4.3  --   --   HGB 10.1* 9.8* 9.9*  HCT 32.8* 32.3* 31.3*  MCV 75.4* 75.6* 73.1*  PLT 303 369 351   Cardiac Enzymes: No results for input(s): CKTOTAL, CKMB, CKMBINDEX, TROPONINI in the last 168 hours. BNP: BNP (last 3 results)  Recent Labs  10/14/14 1608  BNP 1512.0*    ProBNP (last 3 results)  No results for input(s): PROBNP in the last 8760 hours.  CBG: No results for input(s): GLUCAP in the last 168 hours.     Signed:  Lot Medford  [facility] 06/16/2015, 11:54 AM

## 2015-06-16 NOTE — Evaluation (Signed)
Physical Therapy Evaluation Patient Details Name: Sonya [name] MRN: [id number] DOB: [birth year] Today's Date: 06/16/2015   History of Present Illness  Pt is a 64 year old female with ESRD on hemodialysis was admitted due to hypertension.  She is currently residing at [facility] for rehab following a left hip fx 2 weeks ago.  She had an OTIF done.  Clinical Impression   Pt was seen for evaluation.  She was alert and oriented, very cooperative.  Currently, her mobility is significantly limited due to recent hip fx for which she is PWB left.  In addition to weakness in the Left hip from surgery, she has generalized weakness throughout.  Pain is well controlled.  She continues to need assist with transfers and gait is still very limited with a walker.  She would benefit from returning to SNF at d/c.    Follow Up Recommendations SNF    Equipment Recommendations  None recommended by PT    Recommendations for Other Services   OT    Precautions / Restrictions Precautions Precautions: Fall Restrictions LLE Weight Bearing: Partial weight bearing      Mobility  Bed Mobility               General bed mobility comments: not tested-pt up in chair and did not want to get to bed  Transfers Overall transfer level: Needs assistance Equipment used: Rolling walker (2 wheeled) Transfers: Sit to/from Stand Sit to Stand: Min assist         General transfer comment: Instructed in correct hand placement and in anterior weight shift during stance  Ambulation/Gait Ambulation/Gait assistance: Supervision Ambulation Distance (Feet): 20 Feet Assistive device: Rolling walker (2 wheeled) Gait Pattern/deviations: Step-through pattern;Decreased stride length   Gait velocity interpretation: <1.8 ft/sec, indicative of risk for recurrent falls    Stairs            Wheelchair Mobility    Modified Rankin (Stroke Patients Only)       Balance Overall balance assessment:  (not tested)                                            Pertinent Vitals/Pain Pain Assessment: No/denies pain    Home Living Family/patient expects to be discharged to:: Skilled nursing facility                      Prior Function Level of Independence: Independent with assistive device(s)         Comments: daughter typically drives her to HD     Hand Dominance   Dominant Hand: Right    Extremity/Trunk Assessment   Upper Extremity Assessment: Generalized weakness           Lower Extremity Assessment: Generalized weakness;LLE deficits/detail   LLE Deficits / Details: LLE at hip weaker than RLE by one grade  Cervical / Trunk Assessment: Kyphotic  Communication   Communication: No difficulties  Cognition Arousal/Alertness: Awake/alert Behavior During Therapy: WFL for tasks assessed/performed Overall Cognitive Status: Within Functional Limits for tasks assessed                      General Comments      Exercises General Exercises - Lower Extremity Ankle Circles/Pumps: AROM;Both;15 reps;Seated Quad Sets: AROM;Both;10 reps;Seated Long Arc Quad: AROM;Both;10 reps;Seated Hip ABduction/ADduction: Strengthening;Both;10 reps;Seated Hip Flexion/Marching: AROM;Both;10 reps;Seated  Assessment/Plan    PT Assessment All further PT needs can be met in the next venue of care  PT Diagnosis Difficulty walking   PT Problem List    PT Treatment Interventions     PT Goals (Current goals can be found in the Care Plan section) Acute Rehab PT Goals PT Goal Formulation: All assessment and education complete, DC therapy    Frequency     Barriers to discharge        Co-evaluation               End of Session Equipment Utilized During Treatment: Gait belt Activity Tolerance: Patient tolerated treatment well Patient left: in chair;with family/visitor present;with call bell/phone within reach Nurse Communication: Mobility status     Functional Assessment Tool Used: clinical judgement Functional Limitation: Mobility: Walking and moving around Mobility: Walking and Moving Around Current Status (K2706): At least 40 percent but less than 60 percent impaired, limited or restricted Mobility: Walking and Moving Around Goal Status [phone]): At least 40 percent but less than 60 percent impaired, limited or restricted Mobility: Walking and Moving Around Discharge Status [phone]): At least 40 percent but less than 60 percent impaired, limited or restricted    Time: 1126-1159 PT Time Calculation (min) (ACUTE ONLY): 33 min   Charges:   PT Evaluation $Initial PT Evaluation Tier I: 1 Procedure     PT G Codes:   PT G-Codes **NOT FOR INPATIENT CLASS** Functional Assessment Tool Used: clinical judgement Functional Limitation: Mobility: Walking and moving around Mobility: Walking and Moving Around Current Status (V6160): At least 40 percent but less than 60 percent impaired, limited or restricted Mobility: Walking and Moving Around Goal Status [phone]): At least 40 percent but less than 60 percent impaired, limited or restricted Mobility: Walking and Moving Around Discharge Status [phone]): At least 40 percent but less than 60 percent impaired, limited or restricted    Sable Feil  PT 06/16/2015, 12:17 PM [phone]

## 2015-06-16 NOTE — Progress Notes (Signed)
Patient transported to main entrance for discharge by NT via w/c.  No IV access at time of discharge.  Patient's husband to provide transportation in private car to [place].  Patient stable at time of discharge.

## 2015-06-16 NOTE — Progress Notes (Signed)
Attempted to call report to [facility].  Spoke to operator several times and on hold total of 5 minutes.  Will continue to try and call report.

## 2015-06-17 DIAGNOSIS — Z9889 Other specified postprocedural states: Secondary | ICD-10-CM | POA: Diagnosis not present

## 2015-06-17 DIAGNOSIS — K5900 Constipation, unspecified: Secondary | ICD-10-CM | POA: Diagnosis not present

## 2015-06-17 DIAGNOSIS — F411 Generalized anxiety disorder: Secondary | ICD-10-CM | POA: Diagnosis not present

## 2015-06-17 DIAGNOSIS — N186 End stage renal disease: Secondary | ICD-10-CM | POA: Diagnosis not present

## 2015-06-17 DIAGNOSIS — D631 Anemia in chronic kidney disease: Secondary | ICD-10-CM | POA: Diagnosis not present

## 2015-06-17 DIAGNOSIS — N2581 Secondary hyperparathyroidism of renal origin: Secondary | ICD-10-CM | POA: Diagnosis not present

## 2015-06-17 DIAGNOSIS — Z992 Dependence on renal dialysis: Secondary | ICD-10-CM | POA: Diagnosis not present

## 2015-06-17 DIAGNOSIS — D509 Iron deficiency anemia, unspecified: Secondary | ICD-10-CM | POA: Diagnosis not present

## 2015-06-17 DIAGNOSIS — I150 Renovascular hypertension: Secondary | ICD-10-CM | POA: Diagnosis not present

## 2015-06-17 DIAGNOSIS — M25552 Pain in left hip: Secondary | ICD-10-CM | POA: Diagnosis not present

## 2015-06-19 ENCOUNTER — Emergency Department (HOSPITAL_COMMUNITY)
Admission: EM | Admit: 2015-06-19 | Discharge: 2015-06-19 | Disposition: A | Discharge status: Home / Self Care | Payer: Medicare Other | Attending: Emergency Medicine | Admitting: Emergency Medicine

## 2015-06-19 ENCOUNTER — Encounter (HOSPITAL_COMMUNITY): Payer: Self-pay | Admitting: *Deleted

## 2015-06-19 DIAGNOSIS — Z992 Dependence on renal dialysis: Secondary | ICD-10-CM | POA: Insufficient documentation

## 2015-06-19 DIAGNOSIS — N2581 Secondary hyperparathyroidism of renal origin: Secondary | ICD-10-CM | POA: Diagnosis not present

## 2015-06-19 DIAGNOSIS — Q059 Spina bifida, unspecified: Secondary | ICD-10-CM | POA: Insufficient documentation

## 2015-06-19 DIAGNOSIS — K219 Gastro-esophageal reflux disease without esophagitis: Secondary | ICD-10-CM | POA: Insufficient documentation

## 2015-06-19 DIAGNOSIS — Z79899 Other long term (current) drug therapy: Secondary | ICD-10-CM | POA: Diagnosis not present

## 2015-06-19 DIAGNOSIS — N186 End stage renal disease: Secondary | ICD-10-CM | POA: Insufficient documentation

## 2015-06-19 DIAGNOSIS — D509 Iron deficiency anemia, unspecified: Secondary | ICD-10-CM | POA: Diagnosis not present

## 2015-06-19 DIAGNOSIS — D631 Anemia in chronic kidney disease: Secondary | ICD-10-CM | POA: Diagnosis not present

## 2015-06-19 DIAGNOSIS — E039 Hypothyroidism, unspecified: Secondary | ICD-10-CM | POA: Insufficient documentation

## 2015-06-19 DIAGNOSIS — I10 Essential (primary) hypertension: Secondary | ICD-10-CM | POA: Diagnosis not present

## 2015-06-19 DIAGNOSIS — Z862 Personal history of diseases of the blood and blood-forming organs and certain disorders involving the immune mechanism: Secondary | ICD-10-CM | POA: Insufficient documentation

## 2015-06-19 DIAGNOSIS — I120 Hypertensive chronic kidney disease with stage 5 chronic kidney disease or end stage renal disease: Secondary | ICD-10-CM | POA: Insufficient documentation

## 2015-06-19 DIAGNOSIS — Z8669 Personal history of other diseases of the nervous system and sense organs: Secondary | ICD-10-CM | POA: Insufficient documentation

## 2015-06-19 DIAGNOSIS — Z8781 Personal history of (healed) traumatic fracture: Secondary | ICD-10-CM | POA: Insufficient documentation

## 2015-06-19 DIAGNOSIS — Z8542 Personal history of malignant neoplasm of other parts of uterus: Secondary | ICD-10-CM | POA: Insufficient documentation

## 2015-06-19 LAB — I-STAT CHEM 8, ED
BUN: 17 mg/dL (ref 6–20)
CHLORIDE: 98 mmol/L — AB (ref 101–111)
Calcium, Ion: 1.13 mmol/L (ref 1.13–1.30)
Creatinine, Ser: 4.1 mg/dL — ABNORMAL HIGH (ref 0.44–1.00)
Glucose, Bld: 99 mg/dL (ref 65–99)
HEMATOCRIT: 37 % (ref 36.0–46.0)
Hemoglobin: 12.6 g/dL (ref 12.0–15.0)
Potassium: 4.6 mmol/L (ref 3.5–5.1)
SODIUM: 136 mmol/L (ref 135–145)
TCO2: 27 mmol/L (ref 0–100)

## 2015-06-19 MED ORDER — HYDRALAZINE HCL 20 MG/ML IJ SOLN
INTRAMUSCULAR | Status: AC
  Filled 2015-06-19: qty 1

## 2015-06-19 MED ORDER — LABETALOL HCL 5 MG/ML IV SOLN
40.0000 mg | Freq: Once | INTRAVENOUS | Status: AC
  Administered 2015-06-19: 40 mg via INTRAVENOUS
  Filled 2015-06-19: qty 8

## 2015-06-19 MED ORDER — HYDRALAZINE HCL 25 MG PO TABS
ORAL_TABLET | ORAL | Status: AC
  Filled 2015-06-19: qty 2

## 2015-06-19 MED ORDER — HYDRALAZINE HCL 20 MG/ML IJ SOLN
10.0000 mg | Freq: Once | INTRAMUSCULAR | Status: AC
  Administered 2015-06-19: 10 mg via INTRAVENOUS

## 2015-06-19 MED ORDER — HYDRALAZINE HCL 25 MG PO TABS
100.0000 mg | ORAL_TABLET | Freq: Once | ORAL | Status: DC
  Filled 2015-06-19 (×2): qty 4

## 2015-06-19 NOTE — ED Notes (Signed)
Notified EDP that pt sbp remains above 220 after administration of labetalol from day shift nurse. Waiting for PO hydralazine from pharmacy, ordered 10mg  ivp hydralazine now and PO hydralazine on arival from pharmacy.

## 2015-06-19 NOTE — Discharge Instructions (Signed)
Take your medicine as prescribed.  Follow up with your md next week if needed

## 2015-06-19 NOTE — ED Notes (Addendum)
Pt brought in by [facility]. Pt from [place] facility. Pt went to dialysis this morning at 0600. BP was taken at [facility] this afternoon and it was found to be 240/130. Clonidine 0.3mg  PO given by [facility] nursing [facility]. BP checked 90 mins later and it was higher, 290/138. EMS was called and manually checked BP- 240/130, HR -90, resp 16, O2 sat 96% RA. Pt drowsy but responsive to speech, oriented x4. 18g IV left lateral forearm. Pt c/o headache, weakness and slightly blurry vision.

## 2015-06-19 NOTE — ED Provider Notes (Signed)
CSN: [id number]     Arrival date & time 06/19/15  1759 History   First MD Initiated Contact with Patient 06/19/15 1816     Chief Complaint  Patient presents with  . Hypertension     (Consider location/radiation/quality/duration/timing/severity/associated sxs/prior Treatment) Patient is a 64 y.o. female presenting with hypertension. The history is provided by the patient (The patient was brought in for elevated blood pressure. She had her dialysis today. Patient initially had a little bit of a headache but this has improved).  Hypertension This is a recurrent problem. The current episode started 3 to 5 hours ago. The problem occurs constantly. The problem has not changed since onset.Pertinent negatives include no chest pain, no abdominal pain and no headaches. Nothing aggravates the symptoms. Nothing relieves the symptoms.    Past Medical History  Diagnosis Date  . HTN (hypertension)   . Hypothyroidism   . Uterine cancer ([place]) 2012  . Anemia   . CKD (chronic kidney disease)   . Dysphagia   . Hyperparathyroidism due to renal insufficiency ([place])   . GERD (gastroesophageal reflux disease)   . Diverticulitis   . Vertical diplopia   . Vertigo   . Hypertensive urgency 06/2013 & 04/2015  . Fracture of femoral neck, left, closed 05/30/2015  . Gastritis 06/2013 & 05/2015  . Gastroesophageal reflux disease with stricture 05/2015  . ESRD (end stage renal disease) ([place])     On HD  . ICH (intracerebral hemorrhage) ([place]) 10/2014  . 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 Cholecystectomy    . Cesarean section    . Laparoscopic total hysterectomy    . Colonoscopy  2000    TICS, IH  . Colonoscopy  2003 NUR BRBPR D50 V6    DC/[place] TICS, IH  . Abdominal hysterectomy    . Nasal septum surgery    . Esophagogastroduodenoscopy (egd) with esophageal dilation N/A 06/08/2013    Dr. Fields:Stricture at the gastroesophageal junction/MILD NON-erosive gastritis  (inflammation) was found in the gastric antrum; multiple biopsies/NO SOURCE FOR ANEMIA IDETIFIED-MOST LIKELY DUE TO ANEMIA OF CHRONIC DISEASE  . Colonoscopy N/A 09/04/2013    Procedure: COLONOSCOPY;  Surgeon: Danie Binder, MD;  [facility];  Service: Endoscopy;  Laterality: N/A;  10:30AM-moved to 9:25 [name]  . Appendectomy    . Bascilic vein transposition Right 09/17/2013    Procedure: BRACHIAL VEIN TRANSPOSITION;  Surgeon: Conrad Abram, MD;  Location: [place];  Service: Vascular;  Laterality: Right;  . Insertion of dialysis catheter Left 09/17/2013    Procedure: INSERTION OF DIALYSIS CATHETER;  Surgeon: Conrad Shamokin, MD;  Location: [place];  Service: Vascular;  Laterality: Left;  . Bascilic vein transposition Right 10/29/2013    Procedure: RIGHT SECOND STAGE BRACHIAL VEIN TRANSPOSITION;  Surgeon: Conrad Belmont, MD;  Location: [facility] OR;  Service: Vascular;  Laterality: Right;  . Givens capsule study N/A 12/03/2013    Procedure: GIVENS CAPSULE STUDY;  Surgeon: Danie Binder, MD;  [facility];  Service: Endoscopy;  Laterality: N/A;  7:30  . Esophagogastroduodenoscopy N/A 05/23/2015    Procedure: ESOPHAGOGASTRODUODENOSCOPY (EGD);  Surgeon: Danie Binder, MD;  [facility];  Service: Endoscopy;  Laterality: N/A;  1230  . Savory dilation N/A 05/23/2015    Procedure: SAVORY DILATION;  Surgeon: Danie Binder, MD;  [facility];  Service: Endoscopy;  Laterality: N/A;  . Hip pinning,cannulated Left 05/31/2015  Procedure: CANNULATED HIP PINNING;  Surgeon: Leandrew Koyanagi, MD;  Location: [place];  Service: Orthopedics;  Laterality: Left;   Family History  Problem Relation Age of Onset  . Colon cancer Neg Hx   . Cancer Mother   . Diabetes Mother   . Hypertension Mother   . Diabetes Father   . Hypertension Father   . Hypertension Sister    Social History  Substance Use Topics  . Smoking status: Never Smoker   . Smokeless tobacco: Never Used  .  Alcohol Use: No   OB History    Gravida Para Term Preterm AB TAB SAB Ectopic Multiple Living   1 1 1             Review of Systems  Constitutional: Negative for appetite change and fatigue.  HENT: Negative for congestion, ear discharge and sinus pressure.   Eyes: Negative for discharge.  Respiratory: Negative for cough.   Cardiovascular: Negative for chest pain.  Gastrointestinal: Negative for abdominal pain and diarrhea.  Genitourinary: Negative for frequency and hematuria.  Musculoskeletal: Negative for back pain.  Skin: Negative for rash.  Neurological: Negative for seizures and headaches.  Psychiatric/Behavioral: Negative for hallucinations.      Allergies  Cephalexin  Home Medications   Prior to Admission medications   Medication Sig Start Date End Date Taking? Authorizing Provider  allopurinol (ZYLOPRIM) 300 MG tablet Take 0.5 tablets (150 mg total) by mouth daily. Dose was decreased secondary to renal failure. 06/16/15  Yes Rexene Alberts, MD  enoxaparin (LOVENOX) 30 MG/0.3ML injection Inject 0.3 mLs (30 mg total) into the skin daily. 05/31/15  Yes Naiping Ephriam Jenkins, MD  hydrALAZINE (APRESOLINE) 100 MG tablet Take 1 tablet (100 mg total) by mouth every 6 (six) hours. 06/04/15  Yes Thurnell Lose, MD  HYDROcodone-acetaminophen (NORCO) 7.5-325 MG tablet Take 1-2 tablets by mouth every 6 (six) hours as needed for moderate pain. Patient taking differently: Take 1 tablet by mouth every 6 (six) hours as needed for moderate pain.  05/31/15  Yes Naiping Ephriam Jenkins, MD  labetalol (NORMODYNE) 200 MG tablet Take 2 tablets (400 mg total) by mouth 2 (two) times daily. 06/16/15  Yes Rexene Alberts, MD  levothyroxine (SYNTHROID, LEVOTHROID) 50 MCG tablet Take 1 tablet (50 mcg total) by mouth daily. 04/17/15  Yes Samuella Cota, MD  lidocaine-prilocaine (EMLA) cream Apply 1 application topically every Tuesday, Thursday, and Saturday at 6 PM.    Yes Historical Provider, MD  lisinopril  (PRINIVIL,ZESTRIL) 40 MG tablet Take 1 tablet (40 mg total) by mouth daily. 06/16/15  Yes Rexene Alberts, MD  LORazepam (ATIVAN) 0.5 MG tablet Take 1-2 tablets (0.5-1 mg total) by mouth every 6 (six) hours as needed for anxiety or sleep (START WITH 0.5 FIRST; WHEN NECESSARY FOR VERTIGO/DIZZINESS; also prn anxiety and sleep). 06/16/15  Yes Rexene Alberts, MD  meclizine (ANTIVERT) 25 MG tablet Take 1 tablet by mouth every 6 (six) hours as needed for dizziness. dizziness 11/22/14  Yes Historical Provider, MD  metoCLOPramide (REGLAN) 5 MG tablet 1 po before breakfast and lunch and at bedtime. Patient taking differently: Take 5 mg by mouth 4 (four) times daily -  before meals and at bedtime.  05/13/15  Yes Danie Binder, MD  NIFEdipine (PROCARDIA XL/ADALAT-CC) 90 MG 24 hr tablet Take 90 mg by mouth daily. 04/10/15  Yes Historical Provider, MD  omeprazole (PRILOSEC) 20 MG capsule Take 1 capsule (20 mg total) by mouth daily. 07/29/14  Yes Orvil Feil, NP  senna-docusate (SENOKOT-S) 8.6-50 MG tablet Take 1 tablet by mouth 2 (two) times daily.    Yes Historical Provider, MD  cloNIDine (CATAPRES - DOSED IN MG/24 HR) 0.3 mg/24hr patch Place 1 patch (0.3 mg total) onto the skin once a week. Patient not taking: Reported on 06/19/2015 06/16/15   Rexene Alberts, MD   BP 199/84 mmHg  Pulse 85  Temp(Src) 99.8 F (37.7 C) (Oral)  Resp 26  Ht 5\' 6"  (1.676 m)  Wt 140 lb (63.504 kg)  BMI 22.61 kg/m2  SpO2 95% Physical Exam  Constitutional: She is oriented to person, place, and time. She appears well-developed.  HENT:  Head: Normocephalic.  Eyes: Conjunctivae and EOM are normal. No scleral icterus.  Neck: Neck supple. No thyromegaly present.  Cardiovascular: Normal rate and regular rhythm.  Exam reveals no gallop and no friction rub.   No murmur heard. Pulmonary/Chest: No stridor. She has no wheezes. She has no rales. She exhibits no tenderness.  Abdominal: She exhibits no distension. There is no tenderness. There  is no rebound.  Musculoskeletal: Normal range of motion. She exhibits no edema.  Lymphadenopathy:    She has no cervical adenopathy.  Neurological: She is oriented to person, place, and time. She exhibits normal muscle tone. Coordination normal.  Skin: No rash noted. No erythema.  Psychiatric: She has a normal mood and affect. Her behavior is normal.    ED Course  Procedures (including critical care time) Labs Review Labs Reviewed  I-STAT CHEM 8, ED - Abnormal; Notable for the following:    Chloride 98 (*)    Creatinine, Ser 4.10 (*)    All other components within normal limits    Imaging Review No results found. I have personally reviewed and evaluated these images and lab results as part of my medical decision-making.   EKG Interpretation   Date/Time:  Thursday June 19 2015 18:07:40 EST Ventricular Rate:  88 PR Interval:  164 QRS Duration: 79 QT Interval:  372 QTC Calculation: 450 R Axis:   60 Text Interpretation:  Sinus rhythm Probable left atrial enlargement Left  ventricular hypertrophy Confirmed by Cace Osorto  MD, Rodrigus Kilker [phone]) on  06/19/2015 7:41:42 PM      MDM   Final diagnoses:  Essential hypertension    Hypertension poorly controlled. Patient's blood pressure improved with labetalol and hydralazine IV. She is to follow-up with her PCP and take her prescribed medicines    [name], MD 06/19/15 1950

## 2015-06-19 NOTE — ED Notes (Signed)
Discharge instructions given to [facility].Family given discharge instruction and explanation as well as opportunity to ask questions. Offered to ask EDP to come back for questions, family declined. No concerns voiced.

## 2015-06-20 DIAGNOSIS — I150 Renovascular hypertension: Secondary | ICD-10-CM | POA: Diagnosis not present

## 2015-06-20 DIAGNOSIS — Z9889 Other specified postprocedural states: Secondary | ICD-10-CM | POA: Diagnosis not present

## 2015-06-20 DIAGNOSIS — M25552 Pain in left hip: Secondary | ICD-10-CM | POA: Diagnosis not present

## 2015-06-20 DIAGNOSIS — N186 End stage renal disease: Secondary | ICD-10-CM | POA: Diagnosis not present

## 2015-06-20 DIAGNOSIS — K5900 Constipation, unspecified: Secondary | ICD-10-CM | POA: Diagnosis not present

## 2015-06-20 DIAGNOSIS — F411 Generalized anxiety disorder: Secondary | ICD-10-CM | POA: Diagnosis not present

## 2015-06-21 DIAGNOSIS — Z992 Dependence on renal dialysis: Secondary | ICD-10-CM | POA: Diagnosis not present

## 2015-06-21 DIAGNOSIS — K5900 Constipation, unspecified: Secondary | ICD-10-CM | POA: Diagnosis not present

## 2015-06-21 DIAGNOSIS — D509 Iron deficiency anemia, unspecified: Secondary | ICD-10-CM | POA: Diagnosis not present

## 2015-06-21 DIAGNOSIS — Z9889 Other specified postprocedural states: Secondary | ICD-10-CM | POA: Diagnosis not present

## 2015-06-21 DIAGNOSIS — N186 End stage renal disease: Secondary | ICD-10-CM | POA: Diagnosis not present

## 2015-06-21 DIAGNOSIS — F411 Generalized anxiety disorder: Secondary | ICD-10-CM | POA: Diagnosis not present

## 2015-06-21 DIAGNOSIS — D631 Anemia in chronic kidney disease: Secondary | ICD-10-CM | POA: Diagnosis not present

## 2015-06-21 DIAGNOSIS — N2581 Secondary hyperparathyroidism of renal origin: Secondary | ICD-10-CM | POA: Diagnosis not present

## 2015-06-21 DIAGNOSIS — M25552 Pain in left hip: Secondary | ICD-10-CM | POA: Diagnosis not present

## 2015-06-21 DIAGNOSIS — I150 Renovascular hypertension: Secondary | ICD-10-CM | POA: Diagnosis not present

## 2015-06-24 DIAGNOSIS — N186 End stage renal disease: Secondary | ICD-10-CM | POA: Diagnosis not present

## 2015-06-24 DIAGNOSIS — Z992 Dependence on renal dialysis: Secondary | ICD-10-CM | POA: Diagnosis not present

## 2015-06-24 DIAGNOSIS — D509 Iron deficiency anemia, unspecified: Secondary | ICD-10-CM | POA: Diagnosis not present

## 2015-06-24 DIAGNOSIS — D631 Anemia in chronic kidney disease: Secondary | ICD-10-CM | POA: Diagnosis not present

## 2015-06-24 DIAGNOSIS — N2581 Secondary hyperparathyroidism of renal origin: Secondary | ICD-10-CM | POA: Diagnosis not present

## 2015-06-25 DIAGNOSIS — E039 Hypothyroidism, unspecified: Secondary | ICD-10-CM | POA: Diagnosis not present

## 2015-06-25 DIAGNOSIS — I150 Renovascular hypertension: Secondary | ICD-10-CM | POA: Diagnosis not present

## 2015-06-25 DIAGNOSIS — Z9889 Other specified postprocedural states: Secondary | ICD-10-CM | POA: Diagnosis not present

## 2015-06-25 DIAGNOSIS — N186 End stage renal disease: Secondary | ICD-10-CM | POA: Diagnosis not present

## 2015-06-26 DIAGNOSIS — D631 Anemia in chronic kidney disease: Secondary | ICD-10-CM | POA: Diagnosis not present

## 2015-06-26 DIAGNOSIS — Z992 Dependence on renal dialysis: Secondary | ICD-10-CM | POA: Diagnosis not present

## 2015-06-26 DIAGNOSIS — D509 Iron deficiency anemia, unspecified: Secondary | ICD-10-CM | POA: Diagnosis not present

## 2015-06-26 DIAGNOSIS — N2581 Secondary hyperparathyroidism of renal origin: Secondary | ICD-10-CM | POA: Diagnosis not present

## 2015-06-26 DIAGNOSIS — N186 End stage renal disease: Secondary | ICD-10-CM | POA: Diagnosis not present

## 2015-06-27 DIAGNOSIS — I150 Renovascular hypertension: Secondary | ICD-10-CM | POA: Diagnosis not present

## 2015-06-27 DIAGNOSIS — E875 Hyperkalemia: Secondary | ICD-10-CM | POA: Diagnosis not present

## 2015-06-27 DIAGNOSIS — N186 End stage renal disease: Secondary | ICD-10-CM | POA: Diagnosis not present

## 2015-06-27 DIAGNOSIS — Z9889 Other specified postprocedural states: Secondary | ICD-10-CM | POA: Diagnosis not present

## 2015-06-28 DIAGNOSIS — N186 End stage renal disease: Secondary | ICD-10-CM | POA: Diagnosis not present

## 2015-06-28 DIAGNOSIS — N2581 Secondary hyperparathyroidism of renal origin: Secondary | ICD-10-CM | POA: Diagnosis not present

## 2015-06-28 DIAGNOSIS — D509 Iron deficiency anemia, unspecified: Secondary | ICD-10-CM | POA: Diagnosis not present

## 2015-06-28 DIAGNOSIS — Z992 Dependence on renal dialysis: Secondary | ICD-10-CM | POA: Diagnosis not present

## 2015-06-28 DIAGNOSIS — D631 Anemia in chronic kidney disease: Secondary | ICD-10-CM | POA: Diagnosis not present

## 2015-06-30 DIAGNOSIS — S72002D Fracture of unspecified part of neck of left femur, subsequent encounter for closed fracture with routine healing: Secondary | ICD-10-CM | POA: Diagnosis not present

## 2015-07-01 DIAGNOSIS — N186 End stage renal disease: Secondary | ICD-10-CM | POA: Diagnosis not present

## 2015-07-01 DIAGNOSIS — D631 Anemia in chronic kidney disease: Secondary | ICD-10-CM | POA: Diagnosis not present

## 2015-07-01 DIAGNOSIS — Z992 Dependence on renal dialysis: Secondary | ICD-10-CM | POA: Diagnosis not present

## 2015-07-01 DIAGNOSIS — D509 Iron deficiency anemia, unspecified: Secondary | ICD-10-CM | POA: Diagnosis not present

## 2015-07-01 DIAGNOSIS — N2581 Secondary hyperparathyroidism of renal origin: Secondary | ICD-10-CM | POA: Diagnosis not present

## 2015-07-02 DIAGNOSIS — N186 End stage renal disease: Secondary | ICD-10-CM | POA: Diagnosis not present

## 2015-07-02 DIAGNOSIS — Z992 Dependence on renal dialysis: Secondary | ICD-10-CM | POA: Diagnosis not present

## 2015-07-03 DIAGNOSIS — D631 Anemia in chronic kidney disease: Secondary | ICD-10-CM | POA: Diagnosis not present

## 2015-07-03 DIAGNOSIS — Z992 Dependence on renal dialysis: Secondary | ICD-10-CM | POA: Diagnosis not present

## 2015-07-03 DIAGNOSIS — N2581 Secondary hyperparathyroidism of renal origin: Secondary | ICD-10-CM | POA: Diagnosis not present

## 2015-07-03 DIAGNOSIS — N186 End stage renal disease: Secondary | ICD-10-CM | POA: Diagnosis not present

## 2015-07-03 DIAGNOSIS — D509 Iron deficiency anemia, unspecified: Secondary | ICD-10-CM | POA: Diagnosis not present

## 2015-07-05 DIAGNOSIS — Z992 Dependence on renal dialysis: Secondary | ICD-10-CM | POA: Diagnosis not present

## 2015-07-05 DIAGNOSIS — N2581 Secondary hyperparathyroidism of renal origin: Secondary | ICD-10-CM | POA: Diagnosis not present

## 2015-07-05 DIAGNOSIS — N186 End stage renal disease: Secondary | ICD-10-CM | POA: Diagnosis not present

## 2015-07-05 DIAGNOSIS — D631 Anemia in chronic kidney disease: Secondary | ICD-10-CM | POA: Diagnosis not present

## 2015-07-05 DIAGNOSIS — D509 Iron deficiency anemia, unspecified: Secondary | ICD-10-CM | POA: Diagnosis not present

## 2015-07-08 DIAGNOSIS — Z992 Dependence on renal dialysis: Secondary | ICD-10-CM | POA: Diagnosis not present

## 2015-07-08 DIAGNOSIS — N2581 Secondary hyperparathyroidism of renal origin: Secondary | ICD-10-CM | POA: Diagnosis not present

## 2015-07-08 DIAGNOSIS — N186 End stage renal disease: Secondary | ICD-10-CM | POA: Diagnosis not present

## 2015-07-08 DIAGNOSIS — D509 Iron deficiency anemia, unspecified: Secondary | ICD-10-CM | POA: Diagnosis not present

## 2015-07-08 DIAGNOSIS — D631 Anemia in chronic kidney disease: Secondary | ICD-10-CM | POA: Diagnosis not present

## 2015-07-10 DIAGNOSIS — D631 Anemia in chronic kidney disease: Secondary | ICD-10-CM | POA: Diagnosis not present

## 2015-07-10 DIAGNOSIS — F411 Generalized anxiety disorder: Secondary | ICD-10-CM | POA: Diagnosis not present

## 2015-07-10 DIAGNOSIS — D509 Iron deficiency anemia, unspecified: Secondary | ICD-10-CM | POA: Diagnosis not present

## 2015-07-10 DIAGNOSIS — K219 Gastro-esophageal reflux disease without esophagitis: Secondary | ICD-10-CM | POA: Diagnosis not present

## 2015-07-10 DIAGNOSIS — I150 Renovascular hypertension: Secondary | ICD-10-CM | POA: Diagnosis not present

## 2015-07-10 DIAGNOSIS — K5900 Constipation, unspecified: Secondary | ICD-10-CM | POA: Diagnosis not present

## 2015-07-10 DIAGNOSIS — Z992 Dependence on renal dialysis: Secondary | ICD-10-CM | POA: Diagnosis not present

## 2015-07-10 DIAGNOSIS — N186 End stage renal disease: Secondary | ICD-10-CM | POA: Diagnosis not present

## 2015-07-10 DIAGNOSIS — N2581 Secondary hyperparathyroidism of renal origin: Secondary | ICD-10-CM | POA: Diagnosis not present

## 2015-07-10 DIAGNOSIS — E875 Hyperkalemia: Secondary | ICD-10-CM | POA: Diagnosis not present

## 2015-07-12 DIAGNOSIS — N2581 Secondary hyperparathyroidism of renal origin: Secondary | ICD-10-CM | POA: Diagnosis not present

## 2015-07-12 DIAGNOSIS — N186 End stage renal disease: Secondary | ICD-10-CM | POA: Diagnosis not present

## 2015-07-12 DIAGNOSIS — D509 Iron deficiency anemia, unspecified: Secondary | ICD-10-CM | POA: Diagnosis not present

## 2015-07-12 DIAGNOSIS — Z992 Dependence on renal dialysis: Secondary | ICD-10-CM | POA: Diagnosis not present

## 2015-07-12 DIAGNOSIS — D631 Anemia in chronic kidney disease: Secondary | ICD-10-CM | POA: Diagnosis not present

## 2015-07-15 DIAGNOSIS — D509 Iron deficiency anemia, unspecified: Secondary | ICD-10-CM | POA: Diagnosis not present

## 2015-07-15 DIAGNOSIS — D631 Anemia in chronic kidney disease: Secondary | ICD-10-CM | POA: Diagnosis not present

## 2015-07-15 DIAGNOSIS — N2581 Secondary hyperparathyroidism of renal origin: Secondary | ICD-10-CM | POA: Diagnosis not present

## 2015-07-15 DIAGNOSIS — N186 End stage renal disease: Secondary | ICD-10-CM | POA: Diagnosis not present

## 2015-07-15 DIAGNOSIS — Z992 Dependence on renal dialysis: Secondary | ICD-10-CM | POA: Diagnosis not present

## 2015-07-16 DIAGNOSIS — F411 Generalized anxiety disorder: Secondary | ICD-10-CM | POA: Diagnosis not present

## 2015-07-16 DIAGNOSIS — K219 Gastro-esophageal reflux disease without esophagitis: Secondary | ICD-10-CM | POA: Diagnosis not present

## 2015-07-16 DIAGNOSIS — I150 Renovascular hypertension: Secondary | ICD-10-CM | POA: Diagnosis not present

## 2015-07-16 DIAGNOSIS — K5900 Constipation, unspecified: Secondary | ICD-10-CM | POA: Diagnosis not present

## 2015-07-16 DIAGNOSIS — E875 Hyperkalemia: Secondary | ICD-10-CM | POA: Diagnosis not present

## 2015-07-16 DIAGNOSIS — N186 End stage renal disease: Secondary | ICD-10-CM | POA: Diagnosis not present

## 2015-07-17 DIAGNOSIS — N186 End stage renal disease: Secondary | ICD-10-CM | POA: Diagnosis not present

## 2015-07-17 DIAGNOSIS — D509 Iron deficiency anemia, unspecified: Secondary | ICD-10-CM | POA: Diagnosis not present

## 2015-07-17 DIAGNOSIS — Z992 Dependence on renal dialysis: Secondary | ICD-10-CM | POA: Diagnosis not present

## 2015-07-17 DIAGNOSIS — D631 Anemia in chronic kidney disease: Secondary | ICD-10-CM | POA: Diagnosis not present

## 2015-07-17 DIAGNOSIS — N2581 Secondary hyperparathyroidism of renal origin: Secondary | ICD-10-CM | POA: Diagnosis not present

## 2015-07-19 DIAGNOSIS — D631 Anemia in chronic kidney disease: Secondary | ICD-10-CM | POA: Diagnosis not present

## 2015-07-19 DIAGNOSIS — N2581 Secondary hyperparathyroidism of renal origin: Secondary | ICD-10-CM | POA: Diagnosis not present

## 2015-07-19 DIAGNOSIS — N186 End stage renal disease: Secondary | ICD-10-CM | POA: Diagnosis not present

## 2015-07-19 DIAGNOSIS — Z992 Dependence on renal dialysis: Secondary | ICD-10-CM | POA: Diagnosis not present

## 2015-07-19 DIAGNOSIS — D509 Iron deficiency anemia, unspecified: Secondary | ICD-10-CM | POA: Diagnosis not present

## 2015-07-22 DIAGNOSIS — N2581 Secondary hyperparathyroidism of renal origin: Secondary | ICD-10-CM | POA: Diagnosis not present

## 2015-07-22 DIAGNOSIS — D631 Anemia in chronic kidney disease: Secondary | ICD-10-CM | POA: Diagnosis not present

## 2015-07-22 DIAGNOSIS — D509 Iron deficiency anemia, unspecified: Secondary | ICD-10-CM | POA: Diagnosis not present

## 2015-07-22 DIAGNOSIS — N186 End stage renal disease: Secondary | ICD-10-CM | POA: Diagnosis not present

## 2015-07-22 DIAGNOSIS — Z992 Dependence on renal dialysis: Secondary | ICD-10-CM | POA: Diagnosis not present

## 2015-07-23 ENCOUNTER — Other Ambulatory Visit (HOSPITAL_COMMUNITY): Payer: Self-pay | Admitting: Internal Medicine

## 2015-07-23 ENCOUNTER — Ambulatory Visit (HOSPITAL_COMMUNITY)
Admission: RE | Admit: 2015-07-23 | Discharge: 2015-07-23 | Disposition: A | Discharge status: Home / Self Care | Payer: Medicare Other | Source: Ambulatory Visit | Attending: Internal Medicine | Admitting: Internal Medicine

## 2015-07-23 DIAGNOSIS — R51 Headache: Secondary | ICD-10-CM | POA: Diagnosis not present

## 2015-07-23 DIAGNOSIS — G44029 Chronic cluster headache, not intractable: Secondary | ICD-10-CM

## 2015-07-23 DIAGNOSIS — W19XXXA Unspecified fall, initial encounter: Secondary | ICD-10-CM

## 2015-07-23 DIAGNOSIS — Q018 Encephalocele of other sites: Secondary | ICD-10-CM | POA: Insufficient documentation

## 2015-07-24 DIAGNOSIS — N186 End stage renal disease: Secondary | ICD-10-CM | POA: Diagnosis not present

## 2015-07-24 DIAGNOSIS — N2581 Secondary hyperparathyroidism of renal origin: Secondary | ICD-10-CM | POA: Diagnosis not present

## 2015-07-24 DIAGNOSIS — D509 Iron deficiency anemia, unspecified: Secondary | ICD-10-CM | POA: Diagnosis not present

## 2015-07-24 DIAGNOSIS — D631 Anemia in chronic kidney disease: Secondary | ICD-10-CM | POA: Diagnosis not present

## 2015-07-24 DIAGNOSIS — Z992 Dependence on renal dialysis: Secondary | ICD-10-CM | POA: Diagnosis not present

## 2015-07-26 DIAGNOSIS — Z992 Dependence on renal dialysis: Secondary | ICD-10-CM | POA: Diagnosis not present

## 2015-07-26 DIAGNOSIS — D509 Iron deficiency anemia, unspecified: Secondary | ICD-10-CM | POA: Diagnosis not present

## 2015-07-26 DIAGNOSIS — D631 Anemia in chronic kidney disease: Secondary | ICD-10-CM | POA: Diagnosis not present

## 2015-07-26 DIAGNOSIS — N2581 Secondary hyperparathyroidism of renal origin: Secondary | ICD-10-CM | POA: Diagnosis not present

## 2015-07-26 DIAGNOSIS — N186 End stage renal disease: Secondary | ICD-10-CM | POA: Diagnosis not present

## 2015-07-29 DIAGNOSIS — Z992 Dependence on renal dialysis: Secondary | ICD-10-CM | POA: Diagnosis not present

## 2015-07-29 DIAGNOSIS — N2581 Secondary hyperparathyroidism of renal origin: Secondary | ICD-10-CM | POA: Diagnosis not present

## 2015-07-29 DIAGNOSIS — D631 Anemia in chronic kidney disease: Secondary | ICD-10-CM | POA: Diagnosis not present

## 2015-07-29 DIAGNOSIS — N186 End stage renal disease: Secondary | ICD-10-CM | POA: Diagnosis not present

## 2015-07-29 DIAGNOSIS — D509 Iron deficiency anemia, unspecified: Secondary | ICD-10-CM | POA: Diagnosis not present

## 2015-07-30 DIAGNOSIS — I150 Renovascular hypertension: Secondary | ICD-10-CM | POA: Diagnosis not present

## 2015-07-30 DIAGNOSIS — E875 Hyperkalemia: Secondary | ICD-10-CM | POA: Diagnosis not present

## 2015-07-30 DIAGNOSIS — F411 Generalized anxiety disorder: Secondary | ICD-10-CM | POA: Diagnosis not present

## 2015-07-30 DIAGNOSIS — N186 End stage renal disease: Secondary | ICD-10-CM | POA: Diagnosis not present

## 2015-07-31 DIAGNOSIS — N186 End stage renal disease: Secondary | ICD-10-CM | POA: Diagnosis not present

## 2015-07-31 DIAGNOSIS — N2581 Secondary hyperparathyroidism of renal origin: Secondary | ICD-10-CM | POA: Diagnosis not present

## 2015-07-31 DIAGNOSIS — D631 Anemia in chronic kidney disease: Secondary | ICD-10-CM | POA: Diagnosis not present

## 2015-07-31 DIAGNOSIS — Z992 Dependence on renal dialysis: Secondary | ICD-10-CM | POA: Diagnosis not present

## 2015-07-31 DIAGNOSIS — D509 Iron deficiency anemia, unspecified: Secondary | ICD-10-CM | POA: Diagnosis not present

## 2015-08-01 DIAGNOSIS — S72002D Fracture of unspecified part of neck of left femur, subsequent encounter for closed fracture with routine healing: Secondary | ICD-10-CM | POA: Diagnosis not present

## 2015-08-02 DIAGNOSIS — Z992 Dependence on renal dialysis: Secondary | ICD-10-CM | POA: Diagnosis not present

## 2015-08-02 DIAGNOSIS — D509 Iron deficiency anemia, unspecified: Secondary | ICD-10-CM | POA: Diagnosis not present

## 2015-08-02 DIAGNOSIS — D631 Anemia in chronic kidney disease: Secondary | ICD-10-CM | POA: Diagnosis not present

## 2015-08-02 DIAGNOSIS — N2581 Secondary hyperparathyroidism of renal origin: Secondary | ICD-10-CM | POA: Diagnosis not present

## 2015-08-02 DIAGNOSIS — N186 End stage renal disease: Secondary | ICD-10-CM | POA: Diagnosis not present

## 2015-08-05 DIAGNOSIS — N186 End stage renal disease: Secondary | ICD-10-CM | POA: Diagnosis not present

## 2015-08-05 DIAGNOSIS — D509 Iron deficiency anemia, unspecified: Secondary | ICD-10-CM | POA: Diagnosis not present

## 2015-08-05 DIAGNOSIS — N2581 Secondary hyperparathyroidism of renal origin: Secondary | ICD-10-CM | POA: Diagnosis not present

## 2015-08-05 DIAGNOSIS — D631 Anemia in chronic kidney disease: Secondary | ICD-10-CM | POA: Diagnosis not present

## 2015-08-05 DIAGNOSIS — Z992 Dependence on renal dialysis: Secondary | ICD-10-CM | POA: Diagnosis not present

## 2015-08-07 DIAGNOSIS — Z992 Dependence on renal dialysis: Secondary | ICD-10-CM | POA: Diagnosis not present

## 2015-08-07 DIAGNOSIS — D509 Iron deficiency anemia, unspecified: Secondary | ICD-10-CM | POA: Diagnosis not present

## 2015-08-07 DIAGNOSIS — D631 Anemia in chronic kidney disease: Secondary | ICD-10-CM | POA: Diagnosis not present

## 2015-08-07 DIAGNOSIS — N2581 Secondary hyperparathyroidism of renal origin: Secondary | ICD-10-CM | POA: Diagnosis not present

## 2015-08-07 DIAGNOSIS — N186 End stage renal disease: Secondary | ICD-10-CM | POA: Diagnosis not present

## 2015-08-09 ENCOUNTER — Emergency Department (HOSPITAL_COMMUNITY)
Admission: EM | Admit: 2015-08-09 | Discharge: 2015-08-09 | Disposition: A | Discharge status: Home / Self Care | Payer: Medicare Other | Attending: Emergency Medicine | Admitting: Emergency Medicine

## 2015-08-09 ENCOUNTER — Encounter (HOSPITAL_COMMUNITY): Payer: Self-pay | Admitting: *Deleted

## 2015-08-09 ENCOUNTER — Emergency Department (HOSPITAL_COMMUNITY): Payer: Medicare Other

## 2015-08-09 DIAGNOSIS — K219 Gastro-esophageal reflux disease without esophagitis: Secondary | ICD-10-CM | POA: Diagnosis not present

## 2015-08-09 DIAGNOSIS — W19XXXA Unspecified fall, initial encounter: Secondary | ICD-10-CM

## 2015-08-09 DIAGNOSIS — I120 Hypertensive chronic kidney disease with stage 5 chronic kidney disease or end stage renal disease: Secondary | ICD-10-CM | POA: Insufficient documentation

## 2015-08-09 DIAGNOSIS — Y9289 Other specified places as the place of occurrence of the external cause: Secondary | ICD-10-CM | POA: Insufficient documentation

## 2015-08-09 DIAGNOSIS — Z8669 Personal history of other diseases of the nervous system and sense organs: Secondary | ICD-10-CM | POA: Insufficient documentation

## 2015-08-09 DIAGNOSIS — N186 End stage renal disease: Secondary | ICD-10-CM | POA: Insufficient documentation

## 2015-08-09 DIAGNOSIS — Y92009 Unspecified place in unspecified non-institutional (private) residence as the place of occurrence of the external cause: Secondary | ICD-10-CM

## 2015-08-09 DIAGNOSIS — S0083XA Contusion of other part of head, initial encounter: Secondary | ICD-10-CM | POA: Insufficient documentation

## 2015-08-09 DIAGNOSIS — Z992 Dependence on renal dialysis: Secondary | ICD-10-CM | POA: Insufficient documentation

## 2015-08-09 DIAGNOSIS — Z79899 Other long term (current) drug therapy: Secondary | ICD-10-CM | POA: Insufficient documentation

## 2015-08-09 DIAGNOSIS — Y998 Other external cause status: Secondary | ICD-10-CM | POA: Insufficient documentation

## 2015-08-09 DIAGNOSIS — Z8542 Personal history of malignant neoplasm of other parts of uterus: Secondary | ICD-10-CM | POA: Insufficient documentation

## 2015-08-09 DIAGNOSIS — Q059 Spina bifida, unspecified: Secondary | ICD-10-CM | POA: Diagnosis not present

## 2015-08-09 DIAGNOSIS — E039 Hypothyroidism, unspecified: Secondary | ICD-10-CM | POA: Diagnosis not present

## 2015-08-09 DIAGNOSIS — R51 Headache: Secondary | ICD-10-CM | POA: Diagnosis not present

## 2015-08-09 DIAGNOSIS — Z862 Personal history of diseases of the blood and blood-forming organs and certain disorders involving the immune mechanism: Secondary | ICD-10-CM | POA: Insufficient documentation

## 2015-08-09 DIAGNOSIS — W01198A Fall on same level from slipping, tripping and stumbling with subsequent striking against other object, initial encounter: Secondary | ICD-10-CM | POA: Insufficient documentation

## 2015-08-09 DIAGNOSIS — Y9389 Activity, other specified: Secondary | ICD-10-CM | POA: Insufficient documentation

## 2015-08-09 NOTE — ED Notes (Signed)
EMS called & asked if they would be willing to transport pt to [place] rather than back to [facility]. They aggred, April was called at [facility] and informed EMS was willing to transport for them.

## 2015-08-09 NOTE — ED Provider Notes (Signed)
CSN: [id number]     Arrival date & time 08/09/15  0326 History   First MD Initiated Contact with Patient 08/09/15 [phone]     Chief Complaint  Patient presents with  . Fall     (Consider location/radiation/quality/duration/timing/severity/associated sxs/prior Treatment) Patient is a 65 y.o. female presenting with fall. The history is provided by the patient.  Fall  She was getting ready to go to dialysis when she fell and hit the right side of her forehead. She denies loss of consciousness and denies other injury. She does not recall whether she tripped or just lost her balance. She is not on any anticoagulants or antiplatelet agents.  Past Medical History  Diagnosis Date  . HTN (hypertension)   . Hypothyroidism   . Uterine cancer ([place]) 2012  . Anemia   . CKD (chronic kidney disease)   . Dysphagia   . Hyperparathyroidism due to renal insufficiency ([place])   . GERD (gastroesophageal reflux disease)   . Diverticulitis   . Vertical diplopia   . Vertigo   . Hypertensive urgency 06/2013 & 04/2015  . Fracture of femoral neck, left, closed 05/30/2015  . Gastritis 06/2013 & 05/2015  . Gastroesophageal reflux disease with stricture 05/2015  . ESRD (end stage renal disease) ([place])     On HD  . ICH (intracerebral hemorrhage) ([place]) 10/2014  . 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 Cholecystectomy    . Cesarean section    . Laparoscopic total hysterectomy    . Colonoscopy  2000    TICS, IH  . Colonoscopy  2003 NUR BRBPR D50 V6    DC/[place] TICS, IH  . Abdominal hysterectomy    . Nasal septum surgery    . Esophagogastroduodenoscopy (egd) with esophageal dilation N/A 06/08/2013    Dr. Fields:Stricture at the gastroesophageal junction/MILD NON-erosive gastritis (inflammation) was found in the gastric antrum; multiple biopsies/NO SOURCE FOR ANEMIA IDETIFIED-MOST LIKELY DUE TO ANEMIA OF CHRONIC DISEASE  . Colonoscopy N/A 09/04/2013    Procedure: COLONOSCOPY;   Surgeon: Danie Binder, MD;  [facility];  Service: Endoscopy;  Laterality: N/A;  10:30AM-moved to 9:25 [name]  . Appendectomy    . Bascilic vein transposition Right 09/17/2013    Procedure: BRACHIAL VEIN TRANSPOSITION;  Surgeon: Conrad [place], MD;  Location: [place];  Service: Vascular;  Laterality: Right;  . Insertion of dialysis catheter Left 09/17/2013    Procedure: INSERTION OF DIALYSIS CATHETER;  Surgeon: Conrad Purdy, MD;  Location: [place];  Service: Vascular;  Laterality: Left;  . Bascilic vein transposition Right 10/29/2013    Procedure: RIGHT SECOND STAGE BRACHIAL VEIN TRANSPOSITION;  Surgeon: Conrad Ames, MD;  Location: [facility] OR;  Service: Vascular;  Laterality: Right;  . Givens capsule study N/A 12/03/2013    Procedure: GIVENS CAPSULE STUDY;  Surgeon: Danie Binder, MD;  [facility];  Service: Endoscopy;  Laterality: N/A;  7:30  . Esophagogastroduodenoscopy N/A 05/23/2015    Procedure: ESOPHAGOGASTRODUODENOSCOPY (EGD);  Surgeon: Danie Binder, MD;  [facility];  Service: Endoscopy;  Laterality: N/A;  1230  . Savory dilation N/A 05/23/2015    Procedure: SAVORY DILATION;  Surgeon: Danie Binder, MD;  [facility];  Service: Endoscopy;  Laterality: N/A;  . Hip pinning,cannulated Left 05/31/2015    Procedure: CANNULATED HIP PINNING;  Surgeon: Leandrew Koyanagi, MD;  Location: [place];  Service: Orthopedics;  Laterality: Left;  Family History  Problem Relation Age of Onset  . Colon cancer Neg Hx   . Cancer Mother   . Diabetes Mother   . Hypertension Mother   . Diabetes Father   . Hypertension Father   . Hypertension Sister    Social History  Substance Use Topics  . Smoking status: Never Smoker   . Smokeless tobacco: Never Used  . Alcohol Use: No   OB History    Gravida Para Term Preterm AB TAB SAB Ectopic Multiple Living   1 1 1             Review of Systems  All other systems reviewed and are negative.     Allergies   Cephalexin  Home Medications   Prior to Admission medications   Medication Sig Start Date End Date Taking? Authorizing Provider  alendronate (FOSAMAX) 70 MG tablet Take 70 mg by mouth once a week. Take with a full glass of water on an empty stomach.   Yes Historical Provider, MD  allopurinol (ZYLOPRIM) 300 MG tablet Take 0.5 tablets (150 mg total) by mouth daily. Dose was decreased secondary to renal failure. 06/16/15  Yes Rexene Alberts, MD  cloNIDine (CATAPRES - DOSED IN MG/24 HR) 0.3 mg/24hr patch Place 1 patch (0.3 mg total) onto the skin once a week. 06/16/15  Yes Rexene Alberts, MD  hydrALAZINE (APRESOLINE) 100 MG tablet Take 1 tablet (100 mg total) by mouth every 6 (six) hours. 06/04/15  Yes Thurnell Lose, MD  HYDROcodone-acetaminophen (NORCO) 7.5-325 MG tablet Take 1-2 tablets by mouth every 6 (six) hours as needed for moderate pain. Patient taking differently: Take 1 tablet by mouth every 6 (six) hours as needed for moderate pain.  05/31/15  Yes Naiping Ephriam Jenkins, MD  labetalol (NORMODYNE) 200 MG tablet Take 2 tablets (400 mg total) by mouth 2 (two) times daily. 06/16/15  Yes Rexene Alberts, MD  levothyroxine (SYNTHROID, LEVOTHROID) 50 MCG tablet Take 1 tablet (50 mcg total) by mouth daily. 04/17/15  Yes Samuella Cota, MD  lidocaine-prilocaine (EMLA) cream Apply 1 application topically every Tuesday, Thursday, and Saturday at 6 PM.    Yes Historical Provider, MD  lisinopril (PRINIVIL,ZESTRIL) 40 MG tablet Take 1 tablet (40 mg total) by mouth daily. 06/16/15  Yes Rexene Alberts, MD  LORazepam (ATIVAN) 0.5 MG tablet Take 1-2 tablets (0.5-1 mg total) by mouth every 6 (six) hours as needed for anxiety or sleep (START WITH 0.5 FIRST; WHEN NECESSARY FOR VERTIGO/DIZZINESS; also prn anxiety and sleep). 06/16/15  Yes Rexene Alberts, MD  meclizine (ANTIVERT) 25 MG tablet Take 1 tablet by mouth every 6 (six) hours as needed for dizziness. dizziness 11/22/14  Yes Historical Provider, MD  metoCLOPramide  (REGLAN) 5 MG tablet 1 po before breakfast and lunch and at bedtime. Patient taking differently: Take 5 mg by mouth 4 (four) times daily -  before meals and at bedtime.  05/13/15  Yes Danie Binder, MD  NIFEdipine (PROCARDIA XL/ADALAT-CC) 90 MG 24 hr tablet Take 90 mg by mouth daily. 04/10/15  Yes Historical Provider, MD  omeprazole (PRILOSEC) 20 MG capsule Take 1 capsule (20 mg total) by mouth daily. 07/29/14  Yes Orvil Feil, NP  senna-docusate (SENOKOT-S) 8.6-50 MG tablet Take 1 tablet by mouth 2 (two) times daily.    Yes Historical Provider, MD  sevelamer carbonate (RENVELA) 800 MG tablet Take 800 mg by mouth 3 (three) times daily with meals.   Yes Historical Provider, MD  enoxaparin (LOVENOX) 30 MG/0.3ML injection Inject  0.3 mLs (30 mg total) into the skin daily. 05/31/15   Naiping Ephriam Jenkins, MD   BP 160/66 mmHg  Pulse 65  Temp(Src) 97.7 F (36.5 C) (Oral)  Resp 18  Ht 5\' 5"  (1.651 m)  Wt 165 lb (74.844 kg)  BMI 27.46 kg/m2  SpO2 98% Physical Exam  Nursing note and vitals reviewed.  65 year old female, resting comfortably and in no acute distress. Vital signs are significant for hypertension. Oxygen saturation is 98%, which is normal. Head is normocephalic. 3 cm hematomas present on the right side of the forehead. PERRLA, EOMI. Oropharynx is clear. Neck is nontender without adenopathy or JVD. Back is nontender and there is no CVA tenderness. Lungs are clear without rales, wheezes, or rhonchi. Chest is nontender. Heart has regular rate and rhythm without murmur. Abdomen is soft, flat, nontender without masses or hepatosplenomegaly and peristalsis is normoactive. Extremities have no cyanosis or edema, full range of motion is present. AV fistula is present in the right upper arm with thrill present. Skin is warm and dry without rash. Neurologic: Mental status is normal, cranial nerves are intact, there are no motor or sensory deficits.   ED Course  Procedures (including critical care  time)  Imaging Review Ct Head Wo Contrast  08/09/2015  CLINICAL DATA:  Status post fall, hitting forehead on floor. Frontal forehead pain. Headache and dizziness. Concern for cervical spine injury. Initial encounter. EXAM: CT HEAD WITHOUT CONTRAST CT CERVICAL SPINE WITHOUT CONTRAST TECHNIQUE: Multidetector CT imaging of the head and cervical spine was performed following the standard protocol without intravenous contrast. Multiplanar CT image reconstructions of the cervical spine were also generated. COMPARISON:  CT of the head performed 07/23/2015, and MRI of the brain performed 11/25/2014 FINDINGS: CT HEAD FINDINGS There is no evidence of acute infarction, mass lesion, or intra- or extra-axial hemorrhage on CT. Scattered periventricular and subcortical white matter change likely reflects small vessel ischemic microangiopathy. Chronic lacunar infarcts are noted at the basal ganglia and thalami bilaterally. A small chronic infarct is noted at the right cerebellar hemisphere. The brainstem and fourth ventricle are within normal limits. The third and lateral ventricles are unremarkable in appearance. The cerebral hemispheres demonstrate grossly normal gray-white differentiation. No mass effect or midline shift is seen. There is no evidence of fracture; visualized osseous structures are unremarkable in appearance. The orbits are within normal limits. 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 The remaining paranasal sinuses and left mastoid air cells are well-aerated. Prominent soft tissue swelling is noted overlying the right frontal calvarium. CT CERVICAL SPINE FINDINGS There is no evidence of fracture or subluxation. Vertebral bodies demonstrate normal height and alignment. Intervertebral disc spaces are preserved. Prevertebral soft tissues are within normal limits. The visualized neural foramina  are grossly unremarkable. The thyroid gland is unremarkable in appearance. The visualized lung apices are clear. No significant soft tissue abnormalities are seen. IMPRESSION: 1. No evidence of traumatic intracranial injury or fracture. 2. No evidence of fracture or subluxation along the cervical spine. 3. Prominent soft tissue swelling overlying the right frontal calvarium. 4. Scattered small vessel ischemic microangiopathy. 5. Chronic lacunar infarcts at the basal ganglia and thalami bilaterally. Small chronic infarct at the right cerebellar hemisphere. 6. 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 Electronically Signed   By: Francoise Schaumann.D.  On: 08/09/2015 05:14   Ct Cervical Spine Wo Contrast  08/09/2015  CLINICAL DATA:  Status post fall, hitting forehead on floor. Frontal forehead pain. Headache and dizziness. Concern for cervical spine injury. Initial encounter. EXAM: CT HEAD WITHOUT CONTRAST CT CERVICAL SPINE WITHOUT CONTRAST TECHNIQUE: Multidetector CT imaging of the head and cervical spine was performed following the standard protocol without intravenous contrast. Multiplanar CT image reconstructions of the cervical spine were also generated. COMPARISON:  CT of the head performed 07/23/2015, and MRI of the brain performed 11/25/2014 FINDINGS: CT HEAD FINDINGS There is no evidence of acute infarction, mass lesion, or intra- or extra-axial hemorrhage on CT. Scattered periventricular and subcortical white matter change likely reflects small vessel ischemic microangiopathy. Chronic lacunar infarcts are noted at the basal ganglia and thalami bilaterally. A small chronic infarct is noted at the right cerebellar hemisphere. The brainstem and fourth ventricle are within normal limits. The third and lateral ventricles are unremarkable in appearance. The cerebral hemispheres demonstrate grossly normal  gray-white differentiation. No mass effect or midline shift is seen. There is no evidence of fracture; visualized osseous structures are unremarkable in appearance. The orbits are within normal limits. 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 The remaining paranasal sinuses and left mastoid air cells are well-aerated. Prominent soft tissue swelling is noted overlying the right frontal calvarium. CT CERVICAL SPINE FINDINGS There is no evidence of fracture or subluxation. Vertebral bodies demonstrate normal height and alignment. Intervertebral disc spaces are preserved. Prevertebral soft tissues are within normal limits. The visualized neural foramina are grossly unremarkable. The thyroid gland is unremarkable in appearance. The visualized lung apices are clear. No significant soft tissue abnormalities are seen. IMPRESSION: 1. No evidence of traumatic intracranial injury or fracture. 2. No evidence of fracture or subluxation along the cervical spine. 3. Prominent soft tissue swelling overlying the right frontal calvarium. 4. Scattered small vessel ischemic microangiopathy. 5. Chronic lacunar infarcts at the basal ganglia and thalami bilaterally. Small chronic infarct at the right cerebellar hemisphere. 6. 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 Electronically Signed   By: Garald Balding M.D.   On: 08/09/2015 05:14   I have personally reviewed and evaluated these images and lab results as part of my medical decision-making.  MDM   Final diagnoses:  Fall at home, initial encounter  Traumatic hematoma of forehead, initial encounter  End-stage renal disease on hemodialysis [facility])    Fall with foreign hematoma. She'll be sent for CT scan. Old records are reviewed and she does have prior  history of fall with resultant hip fracture.  CT shows no intracranial injury. She is discharged to go to her dialysis session.  Delora Fuel, MD [id number]

## 2015-08-09 NOTE — ED Notes (Signed)
Pt arrived by EMS from [place], pt fell while getting dressed, hitting her head. Pt has a hematoma to her forehead. Pt states she hit the floor.

## 2015-08-09 NOTE — Discharge Instructions (Signed)
Go directly to your dialysis center.  Contusion A contusion is a deep bruise. Contusions are the result of a blunt injury to tissues and muscle fibers under the skin. The injury causes bleeding under the skin. The skin overlying the contusion may turn blue, purple, or yellow. Minor injuries will give you a painless contusion, but more severe contusions may stay painful and swollen for a few weeks.  CAUSES  This condition is usually caused by a blow, trauma, or direct force to an area of the body. SYMPTOMS  Symptoms of this condition include:  Swelling of the injured area.  Pain and tenderness in the injured area.  Discoloration. The area may have redness and then turn blue, purple, or yellow. DIAGNOSIS  This condition is diagnosed based on a physical exam and medical history. An X-ray, CT scan, or MRI may be needed to determine if there are any associated injuries, such as broken bones (fractures). TREATMENT  Specific treatment for this condition depends on what area of the body was injured. In general, the best treatment for a contusion is resting, icing, applying pressure to (compression), and elevating the injured area. This is often called the RICE strategy. Over-the-counter anti-inflammatory medicines may also be recommended for pain control.  HOME CARE INSTRUCTIONS   Rest the injured area.  If directed, apply ice to the injured area:  Put ice in a plastic bag.  Place a towel between your skin and the bag.  Leave the ice on for 20 minutes, 2-3 times per day.  If directed, apply light compression to the injured area using an elastic bandage. Make sure the bandage is not wrapped too tightly. Remove and reapply the bandage as directed by your health care provider.  If possible, raise (elevate) the injured area above the level of your heart while you are sitting or lying down.  Take over-the-counter and prescription medicines only as told by your health care provider. SEEK MEDICAL  CARE IF:  Your symptoms do not improve after several days of treatment.  Your symptoms get worse.  You have difficulty moving the injured area. SEEK IMMEDIATE MEDICAL CARE IF:   You have severe pain.  You have numbness in a hand or foot.  Your hand or foot turns pale or cold.   This information is not intended to replace advice given to you by your health care provider. Make sure you discuss any questions you have with your health care provider.   Document Released: 04/28/2005 Document Revised: 04/09/2015 Document Reviewed: 12/04/2014 Elsevier Interactive Patient Education 2016 [place] in [place]  Falls can cause injuries and can affect people from all age groups. There are many simple things that you can do to make your home safe and to help prevent falls. WHAT CAN I DO ON THE OUTSIDE OF MY HOME?  Regularly repair the edges of walkways and driveways and fix any cracks.  Remove high doorway thresholds.  Trim any shrubbery on the main path into your home.  Use bright outdoor lighting.  Clear walkways of debris and clutter, including tools and rocks.  Regularly check that handrails are securely fastened and in good repair. Both sides of any steps should have handrails.  Install guardrails along the edges of any raised decks or porches.  Have leaves, snow, and ice cleared regularly.  Use sand or salt on walkways during winter months.  In the garage, clean up any spills right away, including grease or oil spills. WHAT CAN  I DO IN THE BATHROOM?  Use night lights.  Install grab bars by the toilet and in the tub and shower. Do not use towel bars as grab bars.  Use non-skid mats or decals on the floor of the tub or shower.  If you need to sit down while you are in the shower, use a plastic, non-slip stool.Marland Kitchen  Keep the floor dry. Immediately clean up any water that spills on the floor.  Remove soap buildup in the tub or shower on a regular  basis.  Attach bath mats securely with double-sided non-slip rug tape.  Remove throw rugs and other tripping hazards from the floor. WHAT CAN I DO IN THE BEDROOM?  Use night lights.  Make sure that a bedside light is easy to reach.  Do not use oversized bedding that drapes onto the floor.  Have a firm chair that has side arms to use for getting dressed.  Remove throw rugs and other tripping hazards from the floor. WHAT CAN I DO IN THE KITCHEN?   Clean up any spills right away.  Avoid walking on wet floors.  Place frequently used items in easy-to-reach places.  If you need to reach for something above you, use a sturdy step stool that has a grab bar.  Keep electrical cables out of the way.  Do not use floor polish or wax that makes floors slippery. If you have to use wax, make sure that it is non-skid floor wax.  Remove throw rugs and other tripping hazards from the floor. WHAT CAN I DO IN THE STAIRWAYS?  Do not leave any items on the stairs.  Make sure that there are handrails on both sides of the stairs. Fix handrails that are broken or loose. Make sure that handrails are as long as the stairways.  Check any carpeting to make sure that it is firmly attached to the stairs. Fix any carpet that is loose or worn.  Avoid having throw rugs at the top or bottom of stairways, or secure the rugs with carpet tape to prevent them from moving.  Make sure that you have a light switch at the top of the stairs and the bottom of the stairs. If you do not have them, have them installed. WHAT ARE SOME OTHER FALL PREVENTION TIPS?  Wear closed-toe shoes that fit well and support your feet. Wear shoes that have rubber soles or low heels.  When you use a stepladder, make sure that it is completely opened and that the sides are firmly locked. Have someone hold the ladder while you are using it. Do not climb a closed stepladder.  Add color or contrast paint or tape to grab bars and handrails  in your home. Place contrasting color strips on the first and last steps.  Use mobility aids as needed, such as canes, walkers, scooters, and crutches.  Turn on lights if it is dark. Replace any light bulbs that burn out.  Set up furniture so that there are clear paths. Keep the furniture in the same spot.  Fix any uneven floor surfaces.  Choose a carpet design that does not hide the edge of steps of a stairway.  Be aware of any and all pets.  Review your medicines with your healthcare provider. Some medicines can cause dizziness or changes in blood pressure, which increase your risk of falling. Talk with your health care provider about other ways that you can decrease your risk of falls. This may include working with a physical  therapist or trainer to improve your strength, balance, and endurance.   This information is not intended to replace advice given to you by your health care provider. Make sure you discuss any questions you have with your health care provider.   Document Released: 07/09/2002 Document Revised: 12/03/2014 Document Reviewed: 08/23/2014 Elsevier Interactive Patient Education [employer].

## 2015-08-09 NOTE — ED Notes (Signed)
EMS called and stated [facility] was closed [address]. Called [facility] back & they states she goes to the one on [address]. Secretary called EMS to advise truck.

## 2015-08-12 ENCOUNTER — Other Ambulatory Visit (HOSPITAL_COMMUNITY): Payer: Self-pay | Admitting: Internal Medicine

## 2015-08-12 DIAGNOSIS — R410 Disorientation, unspecified: Secondary | ICD-10-CM

## 2015-08-12 DIAGNOSIS — N186 End stage renal disease: Secondary | ICD-10-CM | POA: Diagnosis not present

## 2015-08-12 DIAGNOSIS — R51 Headache: Secondary | ICD-10-CM

## 2015-08-12 DIAGNOSIS — N2581 Secondary hyperparathyroidism of renal origin: Secondary | ICD-10-CM | POA: Diagnosis not present

## 2015-08-12 DIAGNOSIS — Z992 Dependence on renal dialysis: Secondary | ICD-10-CM | POA: Diagnosis not present

## 2015-08-12 DIAGNOSIS — D509 Iron deficiency anemia, unspecified: Secondary | ICD-10-CM | POA: Diagnosis not present

## 2015-08-12 DIAGNOSIS — D631 Anemia in chronic kidney disease: Secondary | ICD-10-CM | POA: Diagnosis not present

## 2015-08-12 DIAGNOSIS — W19XXXA Unspecified fall, initial encounter: Secondary | ICD-10-CM

## 2015-08-12 DIAGNOSIS — R519 Headache, unspecified: Secondary | ICD-10-CM

## 2015-08-14 DIAGNOSIS — N186 End stage renal disease: Secondary | ICD-10-CM | POA: Diagnosis not present

## 2015-08-14 DIAGNOSIS — D509 Iron deficiency anemia, unspecified: Secondary | ICD-10-CM | POA: Diagnosis not present

## 2015-08-14 DIAGNOSIS — N2581 Secondary hyperparathyroidism of renal origin: Secondary | ICD-10-CM | POA: Diagnosis not present

## 2015-08-14 DIAGNOSIS — D631 Anemia in chronic kidney disease: Secondary | ICD-10-CM | POA: Diagnosis not present

## 2015-08-14 DIAGNOSIS — Z992 Dependence on renal dialysis: Secondary | ICD-10-CM | POA: Diagnosis not present

## 2015-08-15 ENCOUNTER — Other Ambulatory Visit (HOSPITAL_COMMUNITY): Payer: Self-pay | Admitting: Internal Medicine

## 2015-08-15 ENCOUNTER — Ambulatory Visit (HOSPITAL_COMMUNITY)
Admission: RE | Admit: 2015-08-15 | Discharge: 2015-08-15 | Disposition: A | Discharge status: Home / Self Care | Payer: Medicare Other | Source: Ambulatory Visit | Attending: Internal Medicine | Admitting: Internal Medicine

## 2015-08-15 DIAGNOSIS — Q019 Encephalocele, unspecified: Secondary | ICD-10-CM | POA: Diagnosis not present

## 2015-08-15 DIAGNOSIS — R51 Headache: Secondary | ICD-10-CM

## 2015-08-15 DIAGNOSIS — S299XXA Unspecified injury of thorax, initial encounter: Secondary | ICD-10-CM | POA: Diagnosis not present

## 2015-08-15 DIAGNOSIS — S0003XA Contusion of scalp, initial encounter: Secondary | ICD-10-CM | POA: Insufficient documentation

## 2015-08-15 DIAGNOSIS — W19XXXA Unspecified fall, initial encounter: Secondary | ICD-10-CM

## 2015-08-15 DIAGNOSIS — R519 Headache, unspecified: Secondary | ICD-10-CM

## 2015-08-15 DIAGNOSIS — I739 Peripheral vascular disease, unspecified: Secondary | ICD-10-CM | POA: Diagnosis not present

## 2015-08-15 DIAGNOSIS — R410 Disorientation, unspecified: Secondary | ICD-10-CM | POA: Diagnosis not present

## 2015-08-16 DIAGNOSIS — D509 Iron deficiency anemia, unspecified: Secondary | ICD-10-CM | POA: Diagnosis not present

## 2015-08-16 DIAGNOSIS — Z992 Dependence on renal dialysis: Secondary | ICD-10-CM | POA: Diagnosis not present

## 2015-08-16 DIAGNOSIS — N186 End stage renal disease: Secondary | ICD-10-CM | POA: Diagnosis not present

## 2015-08-16 DIAGNOSIS — D631 Anemia in chronic kidney disease: Secondary | ICD-10-CM | POA: Diagnosis not present

## 2015-08-16 DIAGNOSIS — N2581 Secondary hyperparathyroidism of renal origin: Secondary | ICD-10-CM | POA: Diagnosis not present

## 2015-08-18 ENCOUNTER — Encounter: Payer: Self-pay | Admitting: Gastroenterology

## 2015-08-19 ENCOUNTER — Encounter (HOSPITAL_COMMUNITY): Payer: Self-pay

## 2015-08-19 ENCOUNTER — Inpatient Hospital Stay (HOSPITAL_COMMUNITY)
Admission: EM | Admit: 2015-08-19 | Discharge: 2015-08-24 | DRG: 291 | Disposition: A | Discharge status: Skilled Nursing Facility | Payer: Medicare Other | Attending: Family Medicine | Admitting: Family Medicine

## 2015-08-19 ENCOUNTER — Emergency Department (HOSPITAL_COMMUNITY): Payer: Medicare Other

## 2015-08-19 DIAGNOSIS — J189 Pneumonia, unspecified organism: Secondary | ICD-10-CM | POA: Diagnosis not present

## 2015-08-19 DIAGNOSIS — I160 Hypertensive urgency: Secondary | ICD-10-CM | POA: Diagnosis not present

## 2015-08-19 DIAGNOSIS — Z8542 Personal history of malignant neoplasm of other parts of uterus: Secondary | ICD-10-CM | POA: Diagnosis not present

## 2015-08-19 DIAGNOSIS — I132 Hypertensive heart and chronic kidney disease with heart failure and with stage 5 chronic kidney disease, or end stage renal disease: Principal | ICD-10-CM | POA: Diagnosis present

## 2015-08-19 DIAGNOSIS — I509 Heart failure, unspecified: Secondary | ICD-10-CM | POA: Diagnosis not present

## 2015-08-19 DIAGNOSIS — I6789 Other cerebrovascular disease: Secondary | ICD-10-CM | POA: Diagnosis not present

## 2015-08-19 DIAGNOSIS — I161 Hypertensive emergency: Secondary | ICD-10-CM | POA: Diagnosis present

## 2015-08-19 DIAGNOSIS — I10 Essential (primary) hypertension: Secondary | ICD-10-CM | POA: Diagnosis not present

## 2015-08-19 DIAGNOSIS — M109 Gout, unspecified: Secondary | ICD-10-CM | POA: Diagnosis not present

## 2015-08-19 DIAGNOSIS — E039 Hypothyroidism, unspecified: Secondary | ICD-10-CM | POA: Diagnosis present

## 2015-08-19 DIAGNOSIS — Z8249 Family history of ischemic heart disease and other diseases of the circulatory system: Secondary | ICD-10-CM | POA: Diagnosis not present

## 2015-08-19 DIAGNOSIS — D649 Anemia, unspecified: Secondary | ICD-10-CM | POA: Diagnosis present

## 2015-08-19 DIAGNOSIS — Z833 Family history of diabetes mellitus: Secondary | ICD-10-CM | POA: Diagnosis not present

## 2015-08-19 DIAGNOSIS — D631 Anemia in chronic kidney disease: Secondary | ICD-10-CM | POA: Diagnosis not present

## 2015-08-19 DIAGNOSIS — R0600 Dyspnea, unspecified: Secondary | ICD-10-CM | POA: Diagnosis not present

## 2015-08-19 DIAGNOSIS — R262 Difficulty in walking, not elsewhere classified: Secondary | ICD-10-CM | POA: Diagnosis not present

## 2015-08-19 DIAGNOSIS — Q019 Encephalocele, unspecified: Secondary | ICD-10-CM | POA: Diagnosis not present

## 2015-08-19 DIAGNOSIS — R1110 Vomiting, unspecified: Secondary | ICD-10-CM | POA: Diagnosis not present

## 2015-08-19 DIAGNOSIS — Z9181 History of falling: Secondary | ICD-10-CM | POA: Diagnosis not present

## 2015-08-19 DIAGNOSIS — J9601 Acute respiratory failure with hypoxia: Secondary | ICD-10-CM | POA: Diagnosis present

## 2015-08-19 DIAGNOSIS — I110 Hypertensive heart disease with heart failure: Secondary | ICD-10-CM | POA: Diagnosis not present

## 2015-08-19 DIAGNOSIS — H532 Diplopia: Secondary | ICD-10-CM | POA: Diagnosis not present

## 2015-08-19 DIAGNOSIS — K219 Gastro-esophageal reflux disease without esophagitis: Secondary | ICD-10-CM | POA: Diagnosis present

## 2015-08-19 DIAGNOSIS — I674 Hypertensive encephalopathy: Secondary | ICD-10-CM | POA: Diagnosis present

## 2015-08-19 DIAGNOSIS — R1310 Dysphagia, unspecified: Secondary | ICD-10-CM | POA: Diagnosis not present

## 2015-08-19 DIAGNOSIS — E876 Hypokalemia: Secondary | ICD-10-CM | POA: Diagnosis present

## 2015-08-19 DIAGNOSIS — I5033 Acute on chronic diastolic (congestive) heart failure: Secondary | ICD-10-CM | POA: Diagnosis present

## 2015-08-19 DIAGNOSIS — I120 Hypertensive chronic kidney disease with stage 5 chronic kidney disease or end stage renal disease: Secondary | ICD-10-CM | POA: Diagnosis not present

## 2015-08-19 DIAGNOSIS — N186 End stage renal disease: Secondary | ICD-10-CM | POA: Diagnosis not present

## 2015-08-19 DIAGNOSIS — K5792 Diverticulitis of intestine, part unspecified, without perforation or abscess without bleeding: Secondary | ICD-10-CM | POA: Diagnosis not present

## 2015-08-19 DIAGNOSIS — Z6822 Body mass index (BMI) 22.0-22.9, adult: Secondary | ICD-10-CM

## 2015-08-19 DIAGNOSIS — R51 Headache: Secondary | ICD-10-CM | POA: Diagnosis not present

## 2015-08-19 DIAGNOSIS — S72002D Fracture of unspecified part of neck of left femur, subsequent encounter for closed fracture with routine healing: Secondary | ICD-10-CM | POA: Diagnosis not present

## 2015-08-19 DIAGNOSIS — R279 Unspecified lack of coordination: Secondary | ICD-10-CM | POA: Diagnosis not present

## 2015-08-19 DIAGNOSIS — J810 Acute pulmonary edema: Secondary | ICD-10-CM | POA: Diagnosis not present

## 2015-08-19 DIAGNOSIS — Z992 Dependence on renal dialysis: Secondary | ICD-10-CM

## 2015-08-19 DIAGNOSIS — E43 Unspecified severe protein-calorie malnutrition: Secondary | ICD-10-CM | POA: Diagnosis present

## 2015-08-19 DIAGNOSIS — Z743 Need for continuous supervision: Secondary | ICD-10-CM | POA: Diagnosis not present

## 2015-08-19 DIAGNOSIS — R451 Restlessness and agitation: Secondary | ICD-10-CM | POA: Diagnosis not present

## 2015-08-19 DIAGNOSIS — D509 Iron deficiency anemia, unspecified: Secondary | ICD-10-CM | POA: Diagnosis not present

## 2015-08-19 DIAGNOSIS — J811 Chronic pulmonary edema: Secondary | ICD-10-CM | POA: Diagnosis present

## 2015-08-19 DIAGNOSIS — N2581 Secondary hyperparathyroidism of renal origin: Secondary | ICD-10-CM | POA: Diagnosis not present

## 2015-08-19 DIAGNOSIS — H538 Other visual disturbances: Secondary | ICD-10-CM | POA: Diagnosis not present

## 2015-08-19 DIAGNOSIS — M6281 Muscle weakness (generalized): Secondary | ICD-10-CM | POA: Diagnosis not present

## 2015-08-19 DIAGNOSIS — R05 Cough: Secondary | ICD-10-CM | POA: Diagnosis not present

## 2015-08-19 DIAGNOSIS — R42 Dizziness and giddiness: Secondary | ICD-10-CM | POA: Diagnosis not present

## 2015-08-19 HISTORY — DX: Unspecified protein-calorie malnutrition: E46

## 2015-08-19 LAB — BASIC METABOLIC PANEL
Anion gap: 11 (ref 5–15)
BUN: 17 mg/dL (ref 6–20)
CO2: 30 mmol/L (ref 22–32)
Calcium: 8.7 mg/dL — ABNORMAL LOW (ref 8.9–10.3)
Chloride: 96 mmol/L — ABNORMAL LOW (ref 101–111)
Creatinine, Ser: 3.75 mg/dL — ABNORMAL HIGH (ref 0.44–1.00)
GFR calc Af Amer: 13 mL/min — ABNORMAL LOW (ref >60)
GFR calc non Af Amer: 12 mL/min — ABNORMAL LOW (ref >60)
Glucose, Bld: 89 mg/dL (ref 65–99)
Potassium: 3.3 mmol/L — ABNORMAL LOW (ref 3.5–5.1)
Sodium: 137 mmol/L (ref 135–145)

## 2015-08-19 LAB — CBC WITH DIFFERENTIAL/PLATELET
Basophils Absolute: 0 10*3/uL (ref 0.0–0.1)
Basophils Relative: 1 %
Eosinophils Absolute: 0 10*3/uL (ref 0.0–0.7)
Eosinophils Relative: 0 %
HCT: 32.9 % — ABNORMAL LOW (ref 36.0–46.0)
Hemoglobin: 10.7 g/dL — ABNORMAL LOW (ref 12.0–15.0)
Lymphocytes Relative: 22 %
Lymphs Abs: 0.8 10*3/uL (ref 0.7–4.0)
MCH: 25.2 pg — ABNORMAL LOW (ref 26.0–34.0)
MCHC: 32.5 g/dL (ref 30.0–36.0)
MCV: 77.6 fL — ABNORMAL LOW (ref 78.0–100.0)
Monocytes Absolute: 0.5 10*3/uL (ref 0.1–1.0)
Monocytes Relative: 15 %
Neutro Abs: 2.1 10*3/uL (ref 1.7–7.7)
Neutrophils Relative %: 63 %
Platelets: 164 10*3/uL (ref 150–400)
RBC: 4.24 MIL/uL (ref 3.87–5.11)
RDW: 19.5 % — ABNORMAL HIGH (ref 11.5–15.5)
WBC: 3.4 10*3/uL — ABNORMAL LOW (ref 4.0–10.5)

## 2015-08-19 LAB — GLUCOSE, CAPILLARY: GLUCOSE-CAPILLARY: 100 mg/dL — AB (ref 65–99)

## 2015-08-19 LAB — BRAIN NATRIURETIC PEPTIDE: B Natriuretic Peptide: 3538 pg/mL — ABNORMAL HIGH (ref 0.0–100.0)

## 2015-08-19 LAB — TSH: TSH: 0.394 u[IU]/mL (ref 0.350–4.500)

## 2015-08-19 LAB — TROPONIN I: Troponin I: <0.03 ng/mL (ref <0.031)

## 2015-08-19 MED ORDER — LABETALOL HCL 5 MG/ML IV SOLN
20.0000 mg | Freq: Once | INTRAVENOUS | Status: AC
  Administered 2015-08-19: 20 mg via INTRAVENOUS
  Filled 2015-08-19: qty 4

## 2015-08-19 MED ORDER — LISINOPRIL 10 MG PO TABS
40.0000 mg | ORAL_TABLET | Freq: Every day | ORAL | Status: DC
  Administered 2015-08-20 – 2015-08-21 (×2): 40 mg via ORAL
  Filled 2015-08-19 (×2): qty 4

## 2015-08-19 MED ORDER — SEVELAMER CARBONATE 800 MG PO TABS
2400.0000 mg | ORAL_TABLET | Freq: Three times a day (TID) | ORAL | Status: DC
  Administered 2015-08-20 – 2015-08-22 (×5): 2400 mg via ORAL
  Filled 2015-08-19 (×6): qty 3

## 2015-08-19 MED ORDER — HYDROCOD POLST-CPM POLST ER 10-8 MG/5ML PO SUER
5.0000 mL | Freq: Once | ORAL | Status: AC
  Administered 2015-08-19: 5 mL via ORAL
  Filled 2015-08-19: qty 5

## 2015-08-19 MED ORDER — ONDANSETRON HCL 4 MG PO TABS
4.0000 mg | ORAL_TABLET | Freq: Four times a day (QID) | ORAL | Status: DC | PRN
  Filled 2015-08-19: qty 1

## 2015-08-19 MED ORDER — SODIUM CHLORIDE 0.9 % IV SOLN
1250.0000 mg | Freq: Once | INTRAVENOUS | Status: AC
  Administered 2015-08-19: 1250 mg via INTRAVENOUS
  Filled 2015-08-19: qty 1250

## 2015-08-19 MED ORDER — PANTOPRAZOLE SODIUM 40 MG PO TBEC
40.0000 mg | DELAYED_RELEASE_TABLET | Freq: Every day | ORAL | Status: DC
  Administered 2015-08-20 – 2015-08-24 (×5): 40 mg via ORAL
  Filled 2015-08-19 (×5): qty 1

## 2015-08-19 MED ORDER — ALENDRONATE SODIUM 70 MG PO TABS: 70.0000 mg | ORAL_TABLET | ORAL | Status: DC

## 2015-08-19 MED ORDER — HYDROCODONE-ACETAMINOPHEN 7.5-325 MG PO TABS
1.0000 | ORAL_TABLET | Freq: Four times a day (QID) | ORAL | Status: DC | PRN
  Filled 2015-08-19: qty 1

## 2015-08-19 MED ORDER — ACETAMINOPHEN 325 MG PO TABS
650.0000 mg | ORAL_TABLET | ORAL | Status: DC | PRN
  Administered 2015-08-19 – 2015-08-24 (×2): 650 mg via ORAL
  Filled 2015-08-19 (×2): qty 2

## 2015-08-19 MED ORDER — VANCOMYCIN HCL IN DEXTROSE 750-5 MG/150ML-% IV SOLN
750.0000 mg | INTRAVENOUS | Status: DC
  Administered 2015-08-21 – 2015-08-23 (×2): 750 mg via INTRAVENOUS
  Filled 2015-08-19 (×2): qty 150

## 2015-08-19 MED ORDER — SENNOSIDES-DOCUSATE SODIUM 8.6-50 MG PO TABS
1.0000 | ORAL_TABLET | Freq: Two times a day (BID) | ORAL | Status: DC
  Administered 2015-08-19 – 2015-08-24 (×9): 1 via ORAL
  Filled 2015-08-19 (×9): qty 1

## 2015-08-19 MED ORDER — NITROGLYCERIN 2 % TD OINT
1.0000 [in_us] | TOPICAL_OINTMENT | Freq: Three times a day (TID) | TRANSDERMAL | Status: DC
  Administered 2015-08-19 – 2015-08-20 (×3): 1 [in_us] via TOPICAL
  Filled 2015-08-19 (×3): qty 1

## 2015-08-19 MED ORDER — ONDANSETRON HCL 4 MG/2ML IJ SOLN
4.0000 mg | Freq: Four times a day (QID) | INTRAMUSCULAR | Status: DC | PRN
  Administered 2015-08-24: 4 mg via INTRAVENOUS
  Filled 2015-08-19 (×3): qty 2

## 2015-08-19 MED ORDER — NIFEDIPINE ER OSMOTIC RELEASE 30 MG PO TB24
90.0000 mg | ORAL_TABLET | Freq: Every day | ORAL | Status: DC
  Administered 2015-08-20 – 2015-08-24 (×4): 90 mg via ORAL
  Filled 2015-08-19 (×4): qty 3

## 2015-08-19 MED ORDER — LABETALOL HCL 200 MG PO TABS
600.0000 mg | ORAL_TABLET | Freq: Two times a day (BID) | ORAL | Status: DC
  Administered 2015-08-19 – 2015-08-20 (×3): 600 mg via ORAL
  Filled 2015-08-19 (×3): qty 3

## 2015-08-19 MED ORDER — CLONIDINE HCL 0.3 MG/24HR TD PTWK
0.3000 mg | MEDICATED_PATCH | TRANSDERMAL | Status: DC
  Filled 2015-08-19: qty 1

## 2015-08-19 MED ORDER — LEVOFLOXACIN IN D5W 500 MG/100ML IV SOLN
500.0000 mg | INTRAVENOUS | Status: DC
  Administered 2015-08-21 – 2015-08-23 (×2): 500 mg via INTRAVENOUS
  Filled 2015-08-19 (×3): qty 100

## 2015-08-19 MED ORDER — FUROSEMIDE 10 MG/ML IJ SOLN
80.0000 mg | Freq: Two times a day (BID) | INTRAMUSCULAR | Status: DC
  Administered 2015-08-19 – 2015-08-22 (×5): 80 mg via INTRAVENOUS
  Filled 2015-08-19 (×5): qty 8

## 2015-08-19 MED ORDER — HYDRALAZINE HCL 25 MG PO TABS
100.0000 mg | ORAL_TABLET | Freq: Four times a day (QID) | ORAL | Status: DC
  Administered 2015-08-19 – 2015-08-24 (×14): 100 mg via ORAL
  Filled 2015-08-19 (×16): qty 4

## 2015-08-19 MED ORDER — LABETALOL HCL 5 MG/ML IV SOLN: 10.0000 mg | INTRAVENOUS | Status: DC | PRN

## 2015-08-19 MED ORDER — SODIUM CHLORIDE 0.9 % IJ SOLN
3.0000 mL | Freq: Two times a day (BID) | INTRAMUSCULAR | Status: DC
  Administered 2015-08-19 – 2015-08-24 (×8): 3 mL via INTRAVENOUS

## 2015-08-19 MED ORDER — NEPRO/CARBSTEADY PO LIQD
237.0000 mL | Freq: Two times a day (BID) | ORAL | Status: DC
  Administered 2015-08-20: 237 mL via ORAL

## 2015-08-19 MED ORDER — NITROGLYCERIN IN D5W 200-5 MCG/ML-% IV SOLN
5.0000 ug/min | Freq: Once | INTRAVENOUS | Status: DC
  Filled 2015-08-19: qty 250

## 2015-08-19 MED ORDER — LEVOFLOXACIN IN D5W 750 MG/150ML IV SOLN: 750.0000 mg | INTRAVENOUS | Status: DC

## 2015-08-19 MED ORDER — LEVOFLOXACIN IN D5W 500 MG/100ML IV SOLN
500.0000 mg | Freq: Once | INTRAVENOUS | Status: AC
  Administered 2015-08-19: 500 mg via INTRAVENOUS
  Filled 2015-08-19: qty 100

## 2015-08-19 MED ORDER — LABETALOL HCL 5 MG/ML IV SOLN
10.0000 mg | Freq: Once | INTRAVENOUS | Status: AC
  Administered 2015-08-19: 10 mg via INTRAVENOUS
  Filled 2015-08-19: qty 4

## 2015-08-19 MED ORDER — NITROGLYCERIN 0.4 MG SL SUBL
0.4000 mg | SUBLINGUAL_TABLET | Freq: Once | SUBLINGUAL | Status: AC
  Administered 2015-08-19: 0.4 mg via SUBLINGUAL
  Filled 2015-08-19: qty 1

## 2015-08-19 MED ORDER — HEPARIN SODIUM (PORCINE) 5000 UNIT/ML IJ SOLN
5000.0000 [IU] | Freq: Three times a day (TID) | INTRAMUSCULAR | Status: DC
  Administered 2015-08-19 – 2015-08-24 (×11): 5000 [IU] via SUBCUTANEOUS
  Filled 2015-08-19 (×12): qty 1

## 2015-08-19 MED ORDER — LEVOTHYROXINE SODIUM 50 MCG PO TABS
50.0000 ug | ORAL_TABLET | Freq: Every day | ORAL | Status: DC
  Administered 2015-08-20 – 2015-08-24 (×5): 50 ug via ORAL
  Filled 2015-08-19 (×5): qty 1

## 2015-08-19 NOTE — ED Notes (Signed)
Dr. Marin Comment aware of fever, tylenol given.

## 2015-08-19 NOTE — Progress Notes (Signed)
ANTIBIOTIC CONSULT NOTE - INITIAL  Pharmacy Consult for Pneumonia Indication: pneumonia  Allergies  Allergen Reactions  . Cephalexin Itching    Patient Measurements: Height: 5\' 4"  (162.6 cm) Weight: 134 lb (60.782 kg) IBW/kg (Calculated) : 54.7  Vital Signs: Temp: 98.4 F (36.9 C) (01/17 1138) Temp Source: Oral (01/17 1138) BP: 247/96 mmHg (01/17 1450) Pulse Rate: 78 (01/17 1138) Intake/Output from previous day:   Intake/Output from this shift:    Labs:  Recent Labs  08/19/15 1311  WBC 3.4*  HGB 10.7*  PLT 164  CREATININE 3.75*   Estimated Creatinine Clearance: 12.7 mL/min (by C-G formula based on Cr of 3.75). No results for input(s): VANCOTROUGH, VANCOPEAK, VANCORANDOM, GENTTROUGH, GENTPEAK, GENTRANDOM, TOBRATROUGH, TOBRAPEAK, TOBRARND, AMIKACINPEAK, AMIKACINTROU, AMIKACIN in the last 72 hours.   Microbiology: No results found for this or any previous visit (from the past 720 hour(s)).  Medical History: Past Medical History  Diagnosis Date  . HTN (hypertension)   . Hypothyroidism   . Uterine cancer ([place]) 2012  . Anemia   . CKD (chronic kidney disease)   . Dysphagia   . Hyperparathyroidism due to renal insufficiency ([place])   . GERD (gastroesophageal reflux disease)   . Diverticulitis   . Vertical diplopia   . Vertigo   . Hypertensive urgency 06/2013 & 04/2015  . Fracture of femoral neck, left, closed 05/30/2015  . Gastritis 06/2013 & 05/2015  . Gastroesophageal reflux disease with stricture 05/2015  . ESRD (end stage renal disease) ([place])     On HD  . ICH (intracerebral hemorrhage) ([place]) 10/2014  . 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 Protein calorie malnutrition ([place])     Medications:  See med rec Vancomycin 1250mg  LD given in ED 1/17 1500 Levaquin 500mg  IV given in ED 1/17 1500  Assessment: 65 yo female with a medical hx of CKD, HTN, ESRD who presents to the Emergency Department complaining of blurred vision onset 2 hours ago. She had the blurred vision start  in dialysis this AM. Pt has a cough and SOB. Chest xray show more focal opacity in the medial right lung base which could represent more focal edema versus a superimposed infiltrate. Empiric tx for PNA with Levaquin and Vancomycin.  Goal of Therapy:  Vancomycin pre-HD level 15-29mcg/ml  Plan:  Continue Vancomycin 750mg  after every HD Levaquin 500mg  IV q48h Measure antibiotic drug levels at steady state Follow up culture results  Monitor V/S and labs  Isac Sarna, BS Pharm D, BCPS Clinical Pharmacist Pager [phone] 08/19/2015,4:40 PM

## 2015-08-19 NOTE — ED Provider Notes (Signed)
CSN: [id number]     Arrival date & time 08/19/15  1121 History  By signing my name below, I, Soijett Blue, attest that this documentation has been prepared under the direction and in the presence of Virgel Manifold, MD. Electronically Signed: Soijett Blue, ED Scribe. 08/19/2015. 12:00 PM.   Chief Complaint  Patient presents with  . Blurred Vision      The history is provided by the patient. No language interpreter was used.    Sonya [name] is a 65 y.o. female with a medical hx of CKD, HTN, ESRD who presents to the Emergency Department complaining of blurred vision onset 2 hours ago. She notes that she was at dialysis this morning and began to have blurred vision. She notes that she has not tried any medications for the relief of her symptoms. She denies dizziness and any other symptoms. She notes that she had a full session of dialysis today. Pt uses a wheelchair as her means of getting around.   She secondarily complains of cough x 3-4 days. She reports that she thinks that she has a touch of pneumonia and that she has been coughing x 3-4 days. She has associated symptoms of SOB. Denies n/v, body aches, and any other symptoms. Denies being on oxygen at home. Denies hx of lung or heart problems. She reports that she has not felt well since her fall on 08/09/15.  Per pt chart review: Pt was seen in the ED on 08/09/2015 for a fall and had negative CT head and CT C-spine, and was dx with a hematoma of the forehead.    Past Medical History  Diagnosis Date  . HTN (hypertension)   . Hypothyroidism   . Uterine cancer ([place]) 2012  . Anemia   . CKD (chronic kidney disease)   . Dysphagia   . Hyperparathyroidism due to renal insufficiency ([place])   . GERD (gastroesophageal reflux disease)   . Diverticulitis   . Vertical diplopia   . Vertigo   . Hypertensive urgency 06/2013 & 04/2015  . Fracture of femoral neck, left, closed 05/30/2015  . Gastritis 06/2013 & 05/2015  . Gastroesophageal reflux disease  with stricture 05/2015  . ESRD (end stage renal disease) ([place])     On HD  . ICH (intracerebral hemorrhage) ([place]) 10/2014  . 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 Protein calorie malnutrition [facility])    Past Surgical History  Procedure Laterality Date  . Cholecystectomy    . Cesarean section    . Laparoscopic total hysterectomy    . Colonoscopy  2000    TICS, IH  . Colonoscopy  2003 NUR BRBPR D50 V6    [place] TICS, IH  . Abdominal hysterectomy    . Nasal septum surgery    . Esophagogastroduodenoscopy (egd) with esophageal dilation N/A 06/08/2013    Dr. Fields:Stricture at the gastroesophageal junction/MILD NON-erosive gastritis (inflammation) was found in the gastric antrum; multiple biopsies/NO SOURCE FOR ANEMIA IDETIFIED-MOST LIKELY DUE TO ANEMIA OF CHRONIC DISEASE  . Colonoscopy N/A 09/04/2013    Procedure: COLONOSCOPY;  Surgeon: Danie Binder, MD;  [facility];  Service: Endoscopy;  Laterality: N/A;  10:30AM-moved to 9:25 [name]  . Appendectomy    . Bascilic vein transposition Right 09/17/2013    Procedure: BRACHIAL VEIN TRANSPOSITION;  Surgeon: Conrad [place], MD;  Location: [place];  Service: Vascular;  Laterality: Right;  . Insertion of dialysis catheter Left 09/17/2013    Procedure: INSERTION OF DIALYSIS CATHETER;  Surgeon:  Conrad [place], MD;  Location: [place];  Service: Vascular;  Laterality: Left;  . Bascilic vein transposition Right 10/29/2013    Procedure: RIGHT SECOND STAGE BRACHIAL VEIN TRANSPOSITION;  Surgeon: Conrad Caldwell, MD;  Location: [facility] OR;  Service: Vascular;  Laterality: Right;  . Givens capsule study N/A 12/03/2013    Procedure: GIVENS CAPSULE STUDY;  Surgeon: Danie Binder, MD;  [facility];  Service: Endoscopy;  Laterality: N/A;  7:30  . Esophagogastroduodenoscopy N/A 05/23/2015    Procedure: ESOPHAGOGASTRODUODENOSCOPY (EGD);  Surgeon: Danie Binder, MD;  [facility];  Service: Endoscopy;  Laterality: N/A;  1230  . Savory dilation N/A  05/23/2015    Procedure: SAVORY DILATION;  Surgeon: Danie Binder, MD;  [facility];  Service: Endoscopy;  Laterality: N/A;  . Hip pinning,cannulated Left 05/31/2015    Procedure: CANNULATED HIP PINNING;  Surgeon: Leandrew Koyanagi, MD;  Location: [facility];  Service: Orthopedics;  Laterality: Left;   Family History  Problem Relation Age of Onset  . Colon cancer Neg Hx   . Cancer Mother   . Diabetes Mother   . Hypertension Mother   . Diabetes Father   . Hypertension Father   . Hypertension Sister    Social History  Substance Use Topics  . Smoking status: Never Smoker   . Smokeless tobacco: Never Used  . Alcohol Use: No   OB History    Gravida Para Term Preterm AB TAB SAB Ectopic Multiple Living   1 1 1             Review of Systems  Eyes: Positive for visual disturbance.  Respiratory: Positive for cough and shortness of breath.   Gastrointestinal: Negative for nausea and vomiting.  Neurological: Negative for dizziness.  All other systems reviewed and are negative.     Allergies  Cephalexin  Home Medications   Prior to Admission medications   Medication Sig Start Date End Date Taking? Authorizing Provider  alendronate (FOSAMAX) 70 MG tablet Take 70 mg by mouth once a week. Take with a full glass of water on an empty stomach.    Historical Provider, MD  allopurinol (ZYLOPRIM) 300 MG tablet Take 0.5 tablets (150 mg total) by mouth daily. Dose was decreased secondary to renal failure. 06/16/15   Rexene Alberts, MD  cloNIDine (CATAPRES - DOSED IN MG/24 HR) 0.3 mg/24hr patch Place 1 patch (0.3 mg total) onto the skin once a week. 06/16/15   Rexene Alberts, MD  hydrALAZINE (APRESOLINE) 100 MG tablet Take 1 tablet (100 mg total) by mouth every 6 (six) hours. 06/04/15   Thurnell Lose, MD  HYDROcodone-acetaminophen (NORCO) 7.5-325 MG tablet Take 1-2 tablets by mouth every 6 (six) hours as needed for moderate pain. Patient taking differently: Take 1 tablet by mouth every 6  (six) hours as needed for moderate pain.  05/31/15   Naiping Ephriam Jenkins, MD  labetalol (NORMODYNE) 200 MG tablet Take 2 tablets (400 mg total) by mouth 2 (two) times daily. 06/16/15   Rexene Alberts, MD  levothyroxine (SYNTHROID, LEVOTHROID) 50 MCG tablet Take 1 tablet (50 mcg total) by mouth daily. 04/17/15   Samuella Cota, MD  lidocaine-prilocaine (EMLA) cream Apply 1 application topically every Tuesday, Thursday, and Saturday at 6 PM.     Historical Provider, MD  lisinopril (PRINIVIL,ZESTRIL) 40 MG tablet Take 1 tablet (40 mg total) by mouth daily. 06/16/15   Rexene Alberts, MD  LORazepam (ATIVAN) 0.5 MG tablet Take 1-2 tablets (  0.5-1 mg total) by mouth every 6 (six) hours as needed for anxiety or sleep (START WITH 0.5 FIRST; WHEN NECESSARY FOR VERTIGO/DIZZINESS; also prn anxiety and sleep). 06/16/15   Rexene Alberts, MD  meclizine (ANTIVERT) 25 MG tablet Take 1 tablet by mouth every 6 (six) hours as needed for dizziness. dizziness 11/22/14   Historical Provider, MD  metoCLOPramide (REGLAN) 5 MG tablet 1 po before breakfast and lunch and at bedtime. Patient taking differently: Take 5 mg by mouth 4 (four) times daily -  before meals and at bedtime.  05/13/15   Danie Binder, MD  NIFEdipine (PROCARDIA XL/ADALAT-CC) 90 MG 24 hr tablet Take 90 mg by mouth daily. 04/10/15   Historical Provider, MD  omeprazole (PRILOSEC) 20 MG capsule Take 1 capsule (20 mg total) by mouth daily. 07/29/14   Orvil Feil, NP  senna-docusate (SENOKOT-S) 8.6-50 MG tablet Take 1 tablet by mouth 2 (two) times daily.     Historical Provider, MD  sevelamer carbonate (RENVELA) 800 MG tablet Take 800 mg by mouth 3 (three) times daily with meals.    Historical Provider, MD   BP 234/99 mmHg  Pulse 78  Temp(Src) 98.4 F (36.9 C) (Oral)  Resp 24  Ht 5\' 4"  (1.626 m)  Wt 134 lb (60.782 kg)  BMI 22.99 kg/m2  SpO2 90% Physical Exam  Constitutional: She is oriented to person, place, and time. She appears well-developed and well-nourished.  No distress.  HENT:  Head: Normocephalic and atraumatic.  Right Ear: Hearing normal.  Left Ear: Hearing normal.  Nose: Nose normal.  Mouth/Throat: Oropharynx is clear and moist and mucous membranes are normal.  Eyes: Conjunctivae and EOM are normal. Pupils are equal, round, and reactive to light.  Neck: Normal range of motion. Neck supple.  Cardiovascular: Normal rate, regular rhythm, S1 normal, S2 normal and normal heart sounds.  Exam reveals no gallop and no friction rub.   No murmur heard. Pulmonary/Chest: Effort normal. No respiratory distress. She exhibits no tenderness.  Wet sounding cough. Crackles at bases   Abdominal: Soft. Normal appearance and bowel sounds are normal. There is no hepatosplenomegaly. There is no tenderness. There is no rebound, no guarding, no tenderness at McBurney's point and negative Murphy's sign. No hernia.  Musculoskeletal: Normal range of motion.  dialysis graft on right arm with good thrill. symmetric lower extremity edema.   Neurological: She is alert and oriented to person, place, and time. She has normal strength. No cranial nerve deficit or sensory deficit. Coordination normal. GCS eye subscore is 4. GCS verbal subscore is 5. GCS motor subscore is 6.  Difficult to get pt to participate with exam (refuses to smile, "Why do I need to squeeze your hands? I didn't say my hands were bothering me" etc.). No obvious focal deficits.   Skin: Skin is warm, dry and intact. No rash noted. No cyanosis.  Psychiatric: She has a normal mood and affect. Her speech is normal and behavior is normal. Thought content normal.  Nursing note and vitals reviewed.   ED Course  Procedures (including critical care time) DIAGNOSTIC STUDIES: Oxygen Saturation is 90% on RA, low by my interpretation.    COORDINATION OF CARE: 12:00 PM Discussed treatment plan with pt at bedside and pt agreed to plan.    Labs Review Labs Reviewed  BASIC METABOLIC PANEL - Abnormal; Notable for  the following:    Potassium 3.3 (*)    Chloride 96 (*)    Creatinine, Ser 3.75 (*)  Calcium 8.7 (*)    GFR calc non Af Amer 12 (*)    GFR calc Af Amer 13 (*)    All other components within normal limits  CBC WITH DIFFERENTIAL/PLATELET - Abnormal; Notable for the following:    WBC 3.4 (*)    Hemoglobin 10.7 (*)    HCT 32.9 (*)    MCV 77.6 (*)    MCH 25.2 (*)    RDW 19.5 (*)    All other components within normal limits  BRAIN NATRIURETIC PEPTIDE - Abnormal; Notable for the following:    B Natriuretic Peptide 3538.0 (*)    All other components within normal limits  TROPONIN I    Imaging Review Dg Chest 2 View  08/19/2015  CLINICAL DATA:  Cough and congestion for 2-3 weeks. EXAM: CHEST  2 VIEW COMPARISON:  May 30, 2015 FINDINGS: No pneumothorax. Cardiomegaly persists. The hila and mediastinum are unchanged. Diffuse bilateral interstitial opacities are identified with more focal opacity in the medial right lung base. Pleural fluid is also seen on the lateral view. No other acute abnormalities. IMPRESSION: 1. The diffuse bilateral interstitial opacities suggest pulmonary edema given cardiomegaly. There is more focal opacity in the medial right lung base which could represent more focal edema versus a superimposed infiltrate. Recommend follow-up to resolution. Pleural effusions are identified on the lateral view. Electronically Signed   By: Dorise Bullion III M.D   On: 08/19/2015 14:14   I have personally reviewed and evaluated these images and lab results as part of my medical decision-making.   EKG Interpretation   Date/Time:  Tuesday August 19 2015 11:33:11 EST Ventricular Rate:  77 PR Interval:  159 QRS Duration: 91 QT Interval:  456 QTC Calculation: 516 R Axis:   78 Text Interpretation:  Sinus rhythm Left ventricular hypertrophy Prolonged  QT interval Confirmed by Wilson Singer  MD, Samariah Hokenson ([id number]) on 08/19/2015 4:05:14 PM      MDM   Final diagnoses:  HCAP  (healthcare-associated pneumonia)  Malignant hypertension  Acute on chronic heart failure, unspecified heart failure type (HCC)  ESRD (end stage renal disease) ([place])   64yF with cough and dyspnea. Pneumonia and pulmonary edema. Abx.. Malignant hypertension. Nitro gtt. Admission.   I personally preformed the services scribed in my presence. The recorded information has been reviewed is accurate. Virgel Manifold, MD.     Virgel Manifold, MD 08/25/15 [phone]

## 2015-08-19 NOTE — H&P (Signed)
[facility] History and Physical  Sonya [name] [id number] DOB: 1950-08-21    PCP:   [name], MD   Chief Complaint: Blurry vision.   HPI: Sonya [name] is an 65 y.o. female with hx of  ESRD on HD, severe HTN and hx of hypertensive urgency and encephalopathy, hx of hypothyroidism, protein malnutrition, presented to the ER with blurry vision after HD today.  She denied HA, but was found to have severe HTN in the ER with SBP 240.  She stated she had taken all her meds including her antiHTN meds.  Work up in the ER included a CXR which showed CHF, along with possible right medial lung base infiltrate.  Her troponin was negative and her WBC was 3.1K.  Her K was 3.3.  She was given IV Van/Levoquin, along with IV labetelol in the ER, and hospitalist was asked to admit her for pulmonary edema, HTN encephalopathy, and possible HCAP.  On ROS, she had productive coughs for the past couple of weeks, but no fever or chills.   Rewiew of Systems:  Constitutional: Negative for malaise, fever and chills. No significant weight loss or weight gain Eyes: Negative for eye pain, redness and discharge, diplopia,, or flashes of light. ENMT: Negative for ear pain, hoarseness, nasal congestion, sinus pressure and sore throat. No headaches; tinnitus, drooling, or problem swallowing. Cardiovascular: Negative for chest pain, palpitations, diaphoresis, dyspnea and peripheral edema. ; No orthopnea, PND Respiratory: Negative for cough, hemoptysis, wheezing and stridor. No pleuritic chestpain. Gastrointestinal: Negative for nausea, vomiting, diarrhea, constipation, abdominal pain, melena, blood in stool, hematemesis, jaundice and rectal bleeding.    Genitourinary: Negative for frequency, dysuria, incontinence,flank pain and hematuria; Musculoskeletal: Negative for back pain and neck pain. Negative for swelling and trauma.;  Skin: . Negative for pruritus, rash, abrasions, bruising and skin lesion.;  ulcerations Neuro: Negative for headache, lightheadedness and neck stiffness. Negative for weakness, altered level of consciousness , altered mental status, extremity weakness, burning feet, involuntary movement, seizure and syncope.  Psych: negative for anxiety, depression, insomnia, tearfulness, panic attacks, hallucinations, paranoia, suicidal or homicidal ideation    Past Medical History  Diagnosis Date  . HTN (hypertension)   . Hypothyroidism   . Uterine cancer ([place]) 2012  . Anemia   . CKD (chronic kidney disease)   . Dysphagia   . Hyperparathyroidism due to renal insufficiency ([place])   . GERD (gastroesophageal reflux disease)   . Diverticulitis   . Vertical diplopia   . Vertigo   . Hypertensive urgency 06/2013 & 04/2015  . Fracture of femoral neck, left, closed 05/30/2015  . Gastritis 06/2013 & 05/2015  . Gastroesophageal reflux disease with stricture 05/2015  . ESRD (end stage renal disease) ([place])     On HD  . ICH (intracerebral hemorrhage) ([place]) 10/2014  . 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 Protein calorie malnutrition [facility])     Past Surgical History  Procedure Laterality Date  . Cholecystectomy    . Cesarean section    . Laparoscopic total hysterectomy    . Colonoscopy  2000    TICS, IH  . Colonoscopy  2003 NUR BRBPR D50 V6    [place] TICS, IH  . Abdominal hysterectomy    . Nasal septum surgery    . Esophagogastroduodenoscopy (egd) with esophageal dilation N/A 06/08/2013    Dr. Fields:Stricture at the gastroesophageal junction/MILD NON-erosive gastritis (inflammation) was found in the gastric antrum; multiple biopsies/NO SOURCE FOR ANEMIA IDETIFIED-MOST LIKELY DUE TO ANEMIA OF CHRONIC DISEASE  . Colonoscopy  N/A 09/04/2013    Procedure: COLONOSCOPY;  Surgeon: Danie Binder, MD;  [facility];  Service: Endoscopy;  Laterality: N/A;  10:30AM-moved to 9:25 [name]  . Appendectomy    . Bascilic vein transposition Right 09/17/2013    Procedure: BRACHIAL VEIN  TRANSPOSITION;  Surgeon: Conrad Valencia, MD;  Location: [place];  Service: Vascular;  Laterality: Right;  . Insertion of dialysis catheter Left 09/17/2013    Procedure: INSERTION OF DIALYSIS CATHETER;  Surgeon: Conrad [place], MD;  Location: [place];  Service: Vascular;  Laterality: Left;  . Bascilic vein transposition Right 10/29/2013    Procedure: RIGHT SECOND STAGE BRACHIAL VEIN TRANSPOSITION;  Surgeon: Conrad [place], MD;  Location: [facility] OR;  Service: Vascular;  Laterality: Right;  . Givens capsule study N/A 12/03/2013    Procedure: GIVENS CAPSULE STUDY;  Surgeon: Danie Binder, MD;  [facility];  Service: Endoscopy;  Laterality: N/A;  7:30  . Esophagogastroduodenoscopy N/A 05/23/2015    Procedure: ESOPHAGOGASTRODUODENOSCOPY (EGD);  Surgeon: Danie Binder, MD;  [facility];  Service: Endoscopy;  Laterality: N/A;  1230  . Savory dilation N/A 05/23/2015    Procedure: SAVORY DILATION;  Surgeon: Danie Binder, MD;  [facility];  Service: Endoscopy;  Laterality: N/A;  . Hip pinning,cannulated Left 05/31/2015    Procedure: CANNULATED HIP PINNING;  Surgeon: Leandrew Koyanagi, MD;  Location: [place];  Service: Orthopedics;  Laterality: Left;    Medications:  HOME MEDS: Prior to Admission medications   Medication Sig Start Date End Date Taking? Authorizing Provider  alendronate (FOSAMAX) 70 MG tablet Take 70 mg by mouth once a week. Take with a full glass of water on an empty stomach.   Yes Historical Provider, MD  allopurinol (ZYLOPRIM) 300 MG tablet Take 0.5 tablets (150 mg total) by mouth daily. Dose was decreased secondary to renal failure. 06/16/15  Yes Rexene Alberts, MD  cloNIDine (CATAPRES - DOSED IN MG/24 HR) 0.3 mg/24hr patch Place 1 patch (0.3 mg total) onto the skin once a week. 06/16/15  Yes Rexene Alberts, MD  hydrALAZINE (APRESOLINE) 100 MG tablet Take 1 tablet (100 mg total) by mouth every 6 (six) hours. 06/04/15  Yes Thurnell Lose, MD  HYDROcodone-acetaminophen  (NORCO) 7.5-325 MG tablet Take 1-2 tablets by mouth every 6 (six) hours as needed for moderate pain. Patient taking differently: Take 1 tablet by mouth every 6 (six) hours as needed for moderate pain.  05/31/15  Yes Naiping Ephriam Jenkins, MD  labetalol (NORMODYNE) 200 MG tablet Take 2 tablets (400 mg total) by mouth 2 (two) times daily. Patient taking differently: Take 200 mg by mouth every Tuesday, Thursday, and Saturday at 6 PM.  06/16/15  Yes Rexene Alberts, MD  labetalol (NORMODYNE) 200 MG tablet Take 600 mg by mouth 2 (two) times daily.   Yes Historical Provider, MD  levothyroxine (SYNTHROID, LEVOTHROID) 50 MCG tablet Take 1 tablet (50 mcg total) by mouth daily. 04/17/15  Yes Samuella Cota, MD  lidocaine-prilocaine (EMLA) cream Apply 1 application topically every Tuesday, Thursday, and Saturday at 6 PM.    Yes Historical Provider, MD  lisinopril (PRINIVIL,ZESTRIL) 40 MG tablet Take 1 tablet (40 mg total) by mouth daily. 06/16/15  Yes Rexene Alberts, MD  LORazepam (ATIVAN) 0.5 MG tablet Take 1-2 tablets (0.5-1 mg total) by mouth every 6 (six) hours as needed for anxiety or sleep (START WITH 0.5 FIRST; WHEN NECESSARY FOR VERTIGO/DIZZINESS; also prn anxiety  and sleep). Patient taking differently: Take 0.5 mg by mouth 3 (three) times daily.  06/16/15  Yes Rexene Alberts, MD  meclizine (ANTIVERT) 25 MG tablet Take 1 tablet by mouth every 6 (six) hours as needed for dizziness. dizziness 11/22/14  Yes Historical Provider, MD  metoCLOPramide (REGLAN) 5 MG tablet 1 po before breakfast and lunch and at bedtime. Patient taking differently: Take 5 mg by mouth 4 (four) times daily -  before meals and at bedtime.  05/13/15  Yes Danie Binder, MD  NIFEdipine (PROCARDIA XL/ADALAT-CC) 90 MG 24 hr tablet Take 90 mg by mouth daily. 04/10/15  Yes Historical Provider, MD  omeprazole (PRILOSEC) 20 MG capsule Take 1 capsule (20 mg total) by mouth daily. 07/29/14  Yes Orvil Feil, NP  senna-docusate (SENOKOT-S) 8.6-50 MG tablet  Take 1 tablet by mouth 2 (two) times daily.    Yes Historical Provider, MD  sevelamer carbonate (RENVELA) 800 MG tablet Take 2,400 mg by mouth 3 (three) times daily with meals.    Yes Historical Provider, MD  sodium polystyrene (KAYEXALATE) 15 GM/60ML suspension Take 15 g by mouth once. Elevated potassium   Yes Historical Provider, MD  sodium polystyrene (KAYEXALATE) powder Take 30 g by mouth once. For potassium levels   Yes Historical Provider, MD     Allergies:  Allergies  Allergen Reactions  . Cephalexin Itching    Social History:   reports that she has never smoked. She has never used smokeless tobacco. She reports that she does not drink alcohol or use illicit drugs.  Family History: Family History  Problem Relation Age of Onset  . Colon cancer Neg Hx   . Cancer Mother   . Diabetes Mother   . Hypertension Mother   . Diabetes Father   . Hypertension Father   . Hypertension Sister      Physical Exam: Filed Vitals:   08/19/15 1135 08/19/15 1138 08/19/15 1450  BP: 234/99 234/99 247/96  Pulse: 77 78   Temp:  98.4 F (36.9 C)   TempSrc:  Oral   Resp: 19 24 27   Height:  5\' 4"  (1.626 m)   Weight:  60.782 kg (134 lb)   SpO2: 89% 90%    Blood pressure 247/96, pulse 78, temperature 98.4 F (36.9 C), temperature source Oral, resp. rate 27, height 5\' 4"  (1.626 m), weight 60.782 kg (134 lb), SpO2 90 %.  GEN:  Pleasant  patient lying in the stretcher in no acute distress; cooperative with exam. PSYCH:  alert and oriented x4; does not appear anxious or depressed; affect is appropriate. HEENT: Mucous membranes pink and anicteric; PERRLA; EOM intact; no cervical lymphadenopathy nor thyromegaly or carotid bruit; no JVD; There were no stridor. Neck is very supple. Breasts:: Not examined CHEST WALL: No tenderness CHEST: Normal respiration, clear to auscultation bilaterally.  HEART: Regular rate and rhythm.  There are no murmur, rub, or gallops.   BACK: No kyphosis or scoliosis; no  CVA tenderness ABDOMEN: soft and non-tender; no masses, no organomegaly, normal abdominal bowel sounds; no pannus; no intertriginous candida. There is no rebound and no distention. Rectal Exam: Not done EXTREMITIES: No bone or joint deformity; age-appropriate arthropathy of the hands and knees; no edema; no ulcerations.  There is no calf tenderness. Genitalia: not examined PULSES: 2+ and symmetric SKIN: Normal hydration no rash or ulceration CNS: Cranial nerves 2-12 grossly intact no focal lateralizing neurologic deficit.  Speech is fluent; uvula elevated with phonation, facial symmetry and tongue midline. DTR are normal  bilaterally, cerebella exam is intact, barbinski is negative and strengths are equaled bilaterally.  No sensory loss.   Labs on Admission:  Basic Metabolic Panel:  Recent Labs Lab 08/19/15 1311  NA 137  K 3.3*  CL 96*  CO2 30  GLUCOSE 89  BUN 17  CREATININE 3.75*  CALCIUM 8.7*   CBC:  Recent Labs Lab 08/19/15 1311  WBC 3.4*  NEUTROABS 2.1  HGB 10.7*  HCT 32.9*  MCV 77.6*  PLT 164   Cardiac Enzymes:  Recent Labs Lab 08/19/15 1311  TROPONINI <0.03   Radiological Exams on Admission: Dg Chest 2 View  08/19/2015  CLINICAL DATA:  Cough and congestion for 2-3 weeks. EXAM: CHEST  2 VIEW COMPARISON:  May 30, 2015 FINDINGS: No pneumothorax. Cardiomegaly persists. The hila and mediastinum are unchanged. Diffuse bilateral interstitial opacities are identified with more focal opacity in the medial right lung base. Pleural fluid is also seen on the lateral view. No other acute abnormalities. IMPRESSION: 1. The diffuse bilateral interstitial opacities suggest pulmonary edema given cardiomegaly. There is more focal opacity in the medial right lung base which could represent more focal edema versus a superimposed infiltrate. Recommend follow-up to resolution. Pleural effusions are identified on the lateral view. Electronically Signed   By: Dorise Bullion III M.D    On: 08/19/2015 14:14    EKG: Independently reviewed.   Assessment/Plan Present on Admission:  . Pulmonary edema . Hypertensive emergency . Essential hypertension . End stage renal disease ([place]) . Protein-calorie malnutrition, severe ([place]) . Uncontrolled hypertension . Hypothyroidism, adult . Hypertensive urgency  PLAN:  HTN Urgency:  Will give all her meds, and use IV Labetalol PRN for controlling her BP.  She doesn't need IV NTG, so I will d/c it.  She has been on several antiHTN meds including Clonodine and BB.  Will give NTP to her chest wall, and bring her BP down to no higher than 180-190.  I suspect she has uncontrolled HTN.  She also has HTN pulmonary edema, and will be given higher dose of IV Lasix.   PNA:  Given her abnormal CXR and her hx of coughs, will continue with her IV antibiotics given in the ER.  ESRD on HD:  Will consult nephrology for dialysis support.  She is not really SOB, and I suspect will do even better with her BP under better controlled.   HYPOTHYROIDISM:  Continue with supplement, and check TSH.    Other plans as per orders. Code Status: FULL Haskel Khan, MD. FACP [facility] Pager [phone] 7pm to 7am.  08/19/2015, 4:25 PM

## 2015-08-19 NOTE — ED Notes (Signed)
BP reported to Dr. Marin Comment and was told ok to send pt to 300 after giving another dose of labetalol 10mg  IV.

## 2015-08-19 NOTE — ED Notes (Signed)
Ems reports pt was at dialysis and started having blurred vision after approx 2 hours of dialysis and bp was 234/103, cbg 128, hr 76, and 96% on room air.  Pt alert and oriented.  Pt denies any pain but says her vision is "not right."  Pt also has a knot on forehead and pt says she fell a few days ago.

## 2015-08-19 NOTE — Progress Notes (Signed)
Patient requested cough medication for congested cough. Order received from [place] for Tussionex 28ml x1. Order entered.

## 2015-08-19 NOTE — ED Notes (Signed)
Notified Dr. Wilson Singer of pt.

## 2015-08-19 NOTE — Progress Notes (Signed)
Patient refused to take Sevelamer Carbonate.

## 2015-08-20 DIAGNOSIS — J810 Acute pulmonary edema: Secondary | ICD-10-CM

## 2015-08-20 DIAGNOSIS — I10 Essential (primary) hypertension: Secondary | ICD-10-CM

## 2015-08-20 DIAGNOSIS — J189 Pneumonia, unspecified organism: Secondary | ICD-10-CM

## 2015-08-20 DIAGNOSIS — I160 Hypertensive urgency: Secondary | ICD-10-CM

## 2015-08-20 DIAGNOSIS — N186 End stage renal disease: Secondary | ICD-10-CM

## 2015-08-20 LAB — CBC
HEMATOCRIT: 31.1 % — AB (ref 36.0–46.0)
Hemoglobin: 9.9 g/dL — ABNORMAL LOW (ref 12.0–15.0)
MCH: 24.5 pg — AB (ref 26.0–34.0)
MCHC: 31.8 g/dL (ref 30.0–36.0)
MCV: 77 fL — AB (ref 78.0–100.0)
PLATELETS: 121 10*3/uL — AB (ref 150–400)
RBC: 4.04 MIL/uL (ref 3.87–5.11)
RDW: 19.2 % — AB (ref 11.5–15.5)
WBC: 3.4 10*3/uL — AB (ref 4.0–10.5)

## 2015-08-20 LAB — TROPONIN I: Troponin I: <0.03 ng/mL (ref <0.031)

## 2015-08-20 LAB — COMPREHENSIVE METABOLIC PANEL
ALBUMIN: 2.6 g/dL — AB (ref 3.5–5.0)
ALT: 8 U/L — AB (ref 14–54)
AST: 15 U/L (ref 15–41)
Alkaline Phosphatase: 29 U/L — ABNORMAL LOW (ref 38–126)
Anion gap: 10 (ref 5–15)
BUN: 25 mg/dL — AB (ref 6–20)
CHLORIDE: 98 mmol/L — AB (ref 101–111)
CO2: 28 mmol/L (ref 22–32)
CREATININE: 4.47 mg/dL — AB (ref 0.44–1.00)
Calcium: 8.6 mg/dL — ABNORMAL LOW (ref 8.9–10.3)
GFR calc Af Amer: 11 mL/min — ABNORMAL LOW (ref >60)
GFR, EST NON AFRICAN AMERICAN: 9 mL/min — AB (ref >60)
GLUCOSE: 91 mg/dL (ref 65–99)
POTASSIUM: 3.4 mmol/L — AB (ref 3.5–5.1)
Sodium: 136 mmol/L (ref 135–145)
Total Bilirubin: 0.7 mg/dL (ref 0.3–1.2)
Total Protein: 6 g/dL — ABNORMAL LOW (ref 6.5–8.1)

## 2015-08-20 LAB — GLUCOSE, CAPILLARY: Glucose-Capillary: 113 mg/dL — ABNORMAL HIGH (ref 65–99)

## 2015-08-20 MED ORDER — PENTAFLUOROPROP-TETRAFLUOROETH EX AERO: 1.0000 "application " | INHALATION_SPRAY | CUTANEOUS | Status: DC | PRN

## 2015-08-20 MED ORDER — LIDOCAINE HCL (PF) 1 % IJ SOLN
5.0000 mL | INTRAMUSCULAR | Status: DC | PRN
Start: 2015-08-20 — End: 2015-08-21

## 2015-08-20 MED ORDER — SODIUM CHLORIDE 0.9 % IV SOLN
100.0000 mL | INTRAVENOUS | Status: DC | PRN
Start: 2015-08-20 — End: 2015-08-21

## 2015-08-20 MED ORDER — LIDOCAINE-PRILOCAINE 2.5-2.5 % EX CREA: 1.0000 "application " | TOPICAL_CREAM | CUTANEOUS | Status: DC | PRN

## 2015-08-20 MED ORDER — SODIUM CHLORIDE 0.9 % IV SOLN: 100.0000 mL | INTRAVENOUS | Status: DC | PRN

## 2015-08-20 NOTE — Progress Notes (Signed)
PROGRESS NOTE  Sonya [name] [id number] DOB: 1950-12-03 DOA: 08/19/2015 PCP: [name], MD  Summary: 31 yof with a hx of ESRD on HD, HTN, and hypothyroidism presented with blurred vision and a cough. While in the ED, she was found to have severe hypertension. CXR revealed CHF as well as a possible right medial lung infiltrate. She was started on IV abx and admitted for further management.   Assessment/Plan: 1. Hypertensive urgency, appears resolved. Likely pulmonary edema secondary to volume overload. 2. Pulmonary edema secondary to volume overload. Management per nephrology. 3. Possible CAP. Continue IV abx empirically. Afebrile, WBC wnl.  4. Acute hypoxic respiratory failure secondary to pulmonary edema and pneumonia. 5. ESRD on HD. Plan for hemodialysis 1/19. 6. Hypothyroidism, continue synthroid.    Appears weak but nontoxic. Plan continue empiric antibiotics, hemodialysis as per nephrology. Wean oxygen as tolerated.  Check echocardiogram  Code Status: Full DVT prophylaxis: Heparin  Family Communication: Discussed with husband and daughter at bedside Disposition Plan: Pending  Murray Hodgkins, MD  [facility]  Pager [phone] If 7PM-7AM, please contact night-coverage at [email or web address], password [facility] 08/20/2015, 7:06 AM  LOS: 1 day   Consultants:    Procedures:    Antibiotics:  Levaquin 1/17>>  Vancomycin 1/17>>  HPI/Subjective: No complaints today. Seems to be feeling all right. Husband and daughter at bedside.  Objective: Filed Vitals:   08/19/15 1818 08/19/15 1839 08/19/15 2049 08/19/15 2215  BP: 215/98 217/79  185/84  Pulse: 82 81  73  Temp:  100.9 F (38.3 C)  98.6 F (37 C)  TempSrc:  Oral  Oral  Resp:  22  22  Height:  5\' 4"  (1.626 m)    Weight:  53.9 kg (118 lb 13.3 oz)    SpO2:  91% 97% 100%   No intake or output data in the 24 hours ending 08/20/15 0706   Filed Weights   08/19/15 1138 08/19/15 1839  Weight: 60.782 kg  (134 lb) 53.9 kg (118 lb 13.3 oz)    Exam:     Afebrile, hypertensive  General:  Appears calm and comfortable, slightly confused Cardiovascular: RRR, no m/r/g. No LE edema. Telemetry: SR, no arrhythmias  Respiratory: CTA bilaterally, no w/r/r. Normal respiratory effort. Musculoskeletal: grossly normal tone BUE/BLE Psychiatric: Slightly confused, speech fluent, mostly appropriate Neurologic: grossly non-focal.  New data reviewed:  Potassium 3.4  Troponins negative  WBC without change, 3.4, hemoglobin stable 9.9, MCV 77, platelet count 121  Pertinent data since admission:  Potassium 3.3  Creatinine 3.75  BNP 3538   Scheduled Meds: . [START ON 08/24/2015] cloNIDine  0.3 mg Transdermal Weekly  . feeding supplement (NEPRO CARB STEADY)  237 mL Oral BID BM  . furosemide  80 mg Intravenous BID  . heparin  5,000 Units Subcutaneous 3 times per day  . hydrALAZINE  100 mg Oral 4 times per day  . labetalol  600 mg Oral BID  . [START ON 08/21/2015] levofloxacin (LEVAQUIN) IV  500 mg Intravenous Q48H  . levothyroxine  50 mcg Oral QAC breakfast  . lisinopril  40 mg Oral Daily  . NIFEdipine  90 mg Oral Daily  . nitroGLYCERIN  1 inch Topical 3 times per day  . pantoprazole  40 mg Oral Daily  . senna-docusate  1 tablet Oral BID  . sevelamer carbonate  2,400 mg Oral TID WC  . sodium chloride  3 mL Intravenous Q12H  . [START ON 08/21/2015] vancomycin  750 mg Intravenous Q T,Th,Sa-HD   Continuous Infusions:  Principal Problem:   Pulmonary edema Active Problems:   Essential hypertension   Hypertensive urgency   End stage renal disease ([facility])   Hypertensive emergency   Protein-calorie malnutrition, severe ([place])   Uncontrolled hypertension   Hypothyroidism, adult   CAP (community acquired pneumonia)   Time spent 25 minutes    By signing my name below, I, Rosalie Doctor attest that this documentation has been prepared under the direction and in the presence of Murray Hodgkins, MD Electronically signed: Rosalie Doctor, Scribe. 08/20/2015  I personally performed the services described in this documentation. All medical record entries made by the scribe were at my direction. I have reviewed the chart and agree that the record reflects my personal performance and is accurate and complete. Murray Hodgkins, MD

## 2015-08-20 NOTE — Procedures (Signed)
   HEMODIALYSIS NURSING NOTE:  Extra HD treatment scheduled for today cancelled after discussion with Dr. Theador Hawthorne.  Pt presented to HD unit lethargic, BP 90/50, spO2 91% on 3L. Was hypertensive earlier this morning with SBP>200 and was given her regularly scheduled anti-hypertensives.  Transferred back to her room, O2 increased to 4L (sats 94%), and primary RN (Megan Bullins) notified.   Sonya Alexandria, RN, CDN

## 2015-08-20 NOTE — NC FL2 (Signed)
[place] LEVEL OF CARE SCREENING TOOL     IDENTIFICATION  Patient Name: Sonya [name] Birthdate: [birth year] Sex: female Admission Date (Current Location): 08/19/2015  [facility] and [place] Number:  [employer] and Address:  [place] [address], [place]      Provider Number: [id number]  Attending Physician Name and Address:  Samuella Cota, MD  Relative Name and Phone Number:       Current Level of Care: Hospital Recommended Level of Care: [place] Prior Approval Number:    Date Approved/Denied:   PASRR Number: [id number]  Discharge Plan: SNF    Current Diagnoses: Patient Active Problem List   Diagnosis Date Noted  . Pulmonary edema 08/19/2015  . CAP (community acquired pneumonia) 08/19/2015  . Hypothyroidism, adult 06/14/2015  . HA (headache) 06/13/2015  . HTN (hypertension), malignant 06/13/2015  . Elective surgery   . Fracture of femoral neck, left, closed 05/30/2015  . Hip fracture ([place]) 05/30/2015  . Closed fracture of neck of left femur ([place])   . Loss of weight   . Emesis   . Nausea with vomiting 05/13/2015  . UTI (lower urinary tract infection) 04/14/2015  . Orthostatic hypotension 11/27/2014  . Accelerated hypertension 11/25/2014  . Dizziness   . Uncontrolled hypertension   . Acute encephalopathy   . Confusion   . Protein-calorie malnutrition, severe ([place]) 10/15/2014  . Dysphagia 10/15/2014  . Hypertensive emergency 10/14/2014  . Hypertensive urgency, malignant 10/14/2014  . Intracerebral bleed ([place]) 10/14/2014  . End stage renal disease ([place]) 08/31/2013  . Symptomatic anemia 08/18/2013  . Hyperparathyroidism due to renal insufficiency ([place]) 07/09/2013  . Unspecified constipation 06/21/2013  . Gastritis 06/21/2013  . Anemia 06/20/2013  . Dizzy 06/20/2013  . Chronic kidney disease 06/20/2013  . Anemia in chronic kidney disease 06/08/2013  . Regurgitation 06/08/2013  .  Hypokalemia 06/08/2013  . Gout 06/08/2013  . Other dysphagia 06/07/2013  . Hypertensive urgency 06/07/2013  . [place]gmi 04/08/2011  . Essential hypertension 04/08/2011  . Colon cancer screening 04/08/2011    Orientation RESPIRATION BLADDER Height & Weight    Self, Time, Situation, Place  Normal Continent 5\' 4"  (162.6 cm) 118 lbs.  BEHAVIORAL SYMPTOMS/MOOD NEUROLOGICAL BOWEL NUTRITION STATUS  Other (Comment) (n/a)  (n/a) Continent Diet (Renal with 1200 mL fluid restriction)  AMBULATORY STATUS COMMUNICATION OF NEEDS Skin   Limited Assist Verbally Normal                       Personal Care Assistance Level of Assistance  Bathing, Feeding, Dressing Bathing Assistance: Limited assistance Feeding assistance: Limited assistance Dressing Assistance: Limited assistance     Functional Limitations Info  Sight, Hearing, Speech Sight Info: Adequate Hearing Info: Adequate Speech Info: Adequate    SPECIAL CARE FACTORS FREQUENCY                       Contractures      Additional Factors Info  Code Status, Allergies Code Status Info: Full code Allergies Info: Cephalexin           Current Medications (08/20/2015):  This is the current hospital active medication list Current Facility-Administered Medications  Medication Dose Route Frequency Provider Last Rate Last Dose  . acetaminophen (TYLENOL) tablet 650 mg  650 mg Oral Q4H PRN Orvan Falconer, MD   650 mg at 08/19/15 1816  . [START ON 08/24/2015] cloNIDine (CATAPRES - Dosed in mg/24 hr) patch  0.3 mg  0.3 mg Transdermal Weekly Orvan Falconer, MD      . feeding supplement (NEPRO CARB STEADY) liquid 237 mL  237 mL Oral BID BM Orvan Falconer, MD      . furosemide (LASIX) injection 80 mg  80 mg Intravenous BID Orvan Falconer, MD   80 mg at 08/19/15 2003  . heparin injection 5,000 Units  5,000 Units Subcutaneous 3 times per day Orvan Falconer, MD   5,000 Units at 08/20/15 0724  . hydrALAZINE (APRESOLINE) tablet 100 mg  100 mg Oral 4 times per day Orvan Falconer, MD   100 mg at 08/20/15 0723  . HYDROcodone-acetaminophen (NORCO) 7.5-325 MG per tablet 1 tablet  1 tablet Oral Q6H PRN Orvan Falconer, MD      . labetalol (NORMODYNE) tablet 600 mg  600 mg Oral BID Orvan Falconer, MD   600 mg at 08/19/15 2137  . labetalol (NORMODYNE,TRANDATE) injection 10 mg  10 mg Intravenous Q2H PRN Orvan Falconer, MD      . Derrill Memo ON 08/21/2015] levofloxacin (LEVAQUIN) IVPB 500 mg  500 mg Intravenous Q48H Orvan Falconer, MD      . levothyroxine (SYNTHROID, LEVOTHROID) tablet 50 mcg  50 mcg Oral QAC breakfast Orvan Falconer, MD      . lisinopril (PRINIVIL,ZESTRIL) tablet 40 mg  40 mg Oral Daily Orvan Falconer, MD      . NIFEdipine (PROCARDIA-XL/ADALAT-CC/NIFEDICAL-XL) 24 hr tablet 90 mg  90 mg Oral Daily Orvan Falconer, MD      . nitroGLYCERIN (NITROGLYN) 2 % ointment 1 inch  1 inch Topical 3 times per day Orvan Falconer, MD   1 inch at 08/20/15 0723  . ondansetron (ZOFRAN) tablet 4 mg  4 mg Oral Q6H PRN Orvan Falconer, MD       Or  . ondansetron [facility]) injection 4 mg  4 mg Intravenous Q6H PRN Orvan Falconer, MD      . pantoprazole (PROTONIX) EC tablet 40 mg  40 mg Oral Daily Orvan Falconer, MD      . senna-docusate (Senokot-S) tablet 1 tablet  1 tablet Oral BID Orvan Falconer, MD   1 tablet at 08/19/15 2138  . sevelamer carbonate (RENVELA) tablet 2,400 mg  2,400 mg Oral TID WC Orvan Falconer, MD   2,400 mg at 08/19/15 2005  . sodium chloride 0.9 % injection 3 mL  3 mL Intravenous Q12H Orvan Falconer, MD   3 mL at 08/19/15 2138  . [START ON 08/21/2015] vancomycin (VANCOCIN) IVPB 750 mg/150 ml premix  750 mg Intravenous Q T,Th,Sa-HD Orvan Falconer, MD         Discharge Medications: Please see discharge summary for a list of discharge medications.  Relevant Imaging Results:  Relevant Lab Results:   Additional Information dialysis. SS#: [id number]  Salome Arnt, [place]

## 2015-08-20 NOTE — Consult Note (Addendum)
RANITA SPRINGER MRN: [id number] DOB/AGE: 04/21/1951 65 y.o. Primary Care Physician:FUSCO,LAWRENCE J., MD Admit date: 08/19/2015 Chief Complaint:  Chief Complaint  Patient presents with  . Blurred Vision   HPI: Pt is 65 year old female with past medical hx of ESRD who came to ER with c/o Blurred vision  HPI dates back to yesterday when pt started having blurred vision Upon evaluation in ER pt was found to have severe HTN and was admitted for further tx. Pt seen today on 3rd floor. Pt says she is feeling the same.ON questioning about her blurred vision pt says it is same, and says she is not able to quantify /qualify it any further. NO c/o change in speech NO c/o chest pain NO c/o dyspnea No c/o fever. NO c/o nausea/vomiting. NO c/o abdominal pain.  Past Medical History  Diagnosis Date  . HTN (hypertension)   . Hypothyroidism   . Uterine cancer (HCC) 2012  . Anemia   . CKD (chronic kidney disease)   . Dysphagia   . Hyperparathyroidism due to renal insufficiency (HCC)   . GERD (gastroesophageal reflux disease)   . Diverticulitis   . Vertical diplopia   . Vertigo   . Hypertensive urgency 06/2013 & 04/2015  . Fracture of femoral neck, left, closed 05/30/2015  . Gastritis 06/2013 & 05/2015  . Gastroesophageal reflux disease with stricture 05/2015  . ESRD (end stage renal disease) (HCC)     On HD  . ICH (intracerebral hemorrhage) (HCC) 10/2014  . Meningocele (HCC)   . Protein calorie malnutrition (HCC)        Family History  Problem Relation Age of Onset  . Colon cancer Neg Hx   . Cancer Mother   . Diabetes Mother   . Hypertension Mother   . Diabetes Father   . Hypertension Father   . Hypertension Sister     Social History:  reports that she has never smoked. She has never used smokeless tobacco. She reports that she does not drink alcohol or use illicit drugs.   Allergies:  Allergies  Allergen Reactions  . Cephalexin Itching    Medications Prior to Admission   Medication Sig Dispense Refill  . alendronate (FOSAMAX) 70 MG tablet Take 70 mg by mouth once a week. Take with a full glass of water on an empty stomach.    Marland Kitchen allopurinol (ZYLOPRIM) 300 MG tablet Take 0.5 tablets (150 mg total) by mouth daily. Dose was decreased secondary to renal failure.    . cloNIDine (CATAPRES - DOSED IN MG/24 HR) 0.3 mg/24hr patch Place 1 patch (0.3 mg total) onto the skin once a week.    . hydrALAZINE (APRESOLINE) 100 MG tablet Take 1 tablet (100 mg total) by mouth every 6 (six) hours.    Marland Kitchen HYDROcodone-acetaminophen (NORCO) 7.5-325 MG tablet Take 1-2 tablets by mouth every 6 (six) hours as needed for moderate pain. (Patient taking differently: Take 1 tablet by mouth every 6 (six) hours as needed for moderate pain. ) 90 tablet 0  . labetalol (NORMODYNE) 200 MG tablet Take 2 tablets (400 mg total) by mouth 2 (two) times daily. (Patient taking differently: Take 200 mg by mouth every Tuesday, Thursday, and Saturday at 6 PM. )    . labetalol (NORMODYNE) 200 MG tablet Take 600 mg by mouth 2 (two) times daily.    Marland Kitchen levothyroxine (SYNTHROID, LEVOTHROID) 50 MCG tablet Take 1 tablet (50 mcg total) by mouth daily. 30 tablet 0  . lidocaine-prilocaine (EMLA) cream Apply 1 application  topically every Tuesday, Thursday, and Saturday at 6 PM.     . lisinopril (PRINIVIL,ZESTRIL) 40 MG tablet Take 1 tablet (40 mg total) by mouth daily.    Marland Kitchen LORazepam (ATIVAN) 0.5 MG tablet Take 1-2 tablets (0.5-1 mg total) by mouth every 6 (six) hours as needed for anxiety or sleep (START WITH 0.5 FIRST; WHEN NECESSARY FOR VERTIGO/DIZZINESS; also prn anxiety and sleep). (Patient taking differently: Take 0.5 mg by mouth 3 (three) times daily. ) 30 tablet 0  . meclizine (ANTIVERT) 25 MG tablet Take 1 tablet by mouth every 6 (six) hours as needed for dizziness. dizziness    . metoCLOPramide (REGLAN) 5 MG tablet 1 po before breakfast and lunch and at bedtime. (Patient taking differently: Take 5 mg by mouth 4 (four)  times daily -  before meals and at bedtime. ) 90 tablet 11  . NIFEdipine (PROCARDIA XL/ADALAT-CC) 90 MG 24 hr tablet Take 90 mg by mouth daily.    Marland Kitchen omeprazole (PRILOSEC) 20 MG capsule Take 1 capsule (20 mg total) by mouth daily. 31 capsule 5  . senna-docusate (SENOKOT-S) 8.6-50 MG tablet Take 1 tablet by mouth 2 (two) times daily.     . sevelamer carbonate (RENVELA) 800 MG tablet Take 2,400 mg by mouth 3 (three) times daily with meals.     . sodium polystyrene (KAYEXALATE) 15 GM/60ML suspension Take 15 g by mouth once. Elevated potassium    . sodium polystyrene (KAYEXALATE) powder Take 30 g by mouth once. For potassium levels         ZOX:WRUEA from the symptoms mentioned above,there are no other symptoms referable to all systems reviewed.  Melene Muller ON 08/24/2015] cloNIDine  0.3 mg Transdermal Weekly  . feeding supplement (NEPRO CARB STEADY)  237 mL Oral BID BM  . furosemide  80 mg Intravenous BID  . heparin  5,000 Units Subcutaneous 3 times per day  . hydrALAZINE  100 mg Oral 4 times per day  . labetalol  600 mg Oral BID  . [START ON 08/21/2015] levofloxacin (LEVAQUIN) IV  500 mg Intravenous Q48H  . levothyroxine  50 mcg Oral QAC breakfast  . lisinopril  40 mg Oral Daily  . NIFEdipine  90 mg Oral Daily  . nitroGLYCERIN  1 inch Topical 3 times per day  . pantoprazole  40 mg Oral Daily  . senna-docusate  1 tablet Oral BID  . sevelamer carbonate  2,400 mg Oral TID WC  . sodium chloride  3 mL Intravenous Q12H  . [START ON 08/21/2015] vancomycin  750 mg Intravenous Q T,Th,Sa-HD      Physical Exam: Vital signs in last 24 hours: Temp:  [98.4 F (36.9 C)-102 F (38.9 C)] 98.9 F (37.2 C) (01/18 0705) Pulse Rate:  [66-84] 66 (01/18 0705) Resp:  [19-34] 20 (01/18 0705) BP: (185-247)/(72-101) 211/72 mmHg (01/18 0705) SpO2:  [89 %-100 %] 100 % (01/18 0705) Weight:  [118 lb 13.3 oz (53.9 kg)-134 lb (60.782 kg)] 118 lb 13.3 oz (53.9 kg) (01/17 1839) Weight change:  Last BM Date:  08/18/15  Intake/Output from previous day:       Physical Exam: General- pt is awake,alert,follows commands. Resp- No acute REsp distress, Rhonchi+ at bases. CVS- S1S2 regular in rate and rhythm GIT- BS+, soft, NT, ND EXT- NO LE Edema,  No Cyanosis CNS- CN 2-12 grossly intact. Moving all 4 extremities Psych- normal mod and affect Access- AVF+   Lab Results: CBC  Recent Labs  08/19/15 1311 08/20/15 0158  WBC 3.4* 3.4*  HGB 10.7* 9.9*  HCT 32.9* 31.1*  PLT 164 121*    BMET  Recent Labs  08/19/15 1311 08/20/15 0158  NA 137 136  K 3.3* 3.4*  CL 96* 98*  CO2 30 28  GLUCOSE 89 91  BUN 17 25*  CREATININE 3.75* 4.47*  CALCIUM 8.7* 8.6*    MICRO No results found for this or any previous visit (from the past 240 hour(s)).    Lab Results  Component Value Date   PTH 473.2* 06/09/2013   CALCIUM 8.6* 08/20/2015   CAION 1.13 06/19/2015   PHOS 4.6 06/14/2015      Impression: 1)Renal  ESRD on Hd .                Pt on TTS schedule as outpt                Pt underwent HD yesterday as outpt  2)HTN  BP better though not at goal  Medication- On Calcium Channel Blockers On Alpha and beta Blockers On Vasodilators. On [employer]. On Diuretics  3)Anemia HGb at goal.  EPO during HD   4)CKD Mineral-Bone Disorder PTH elevated  Secondary Hyperparathyroidism  present Phosphorus at goal.   5)CNS- hx of cerebral bleed Primary MD following  6)Electrolytes  Hypokalemic    NOrmonatremic   7)Acid base Co2 at goal  8) Resp Pt with possible pneumonia    Pt on Levofloxacin    Plan:  Will dialyze today and in am Will use 4 k bath today as K is on lower side Will not give epo today as this si her extra tx and bp on higher side. Improvement in fluid status will help with HTN as well    Addendum Plan was to dialyze pt on 1/181/7 but the dialysis had to postponed as by the time pt reached HD unit pt was hypotensive and  lethargic. Will dialyze on 08/11/15 -TTS on her regular scheduled time   Rajvir Ernster S 08/20/2015, 8:45 AM

## 2015-08-20 NOTE — Clinical Social Work Note (Signed)
Clinical Social Work Assessment  Patient Details  Name: Sonya [name] MRN: [id number] Date of Birth: 1950/12/19  Date of referral:  08/20/15               Reason for consult:  Facility Placement                Permission sought to share information with:    Permission granted to share information::     Name::        Agency::     Relationship::     Contact Information:     Housing/Transportation Living arrangements for the past 2 months:  [place] of Information:  Patient, Facility Patient Interpreter Needed:  None Criminal Activity/Legal Involvement Pertinent to Current Situation/Hospitalization:  No - Comment as needed Significant Relationships:  Spouse, Adult Children Lives with:  Facility Resident Do you feel safe going back to the place where you live?  Yes Need for family participation in patient care:  Yes (Comment)  Care giving concerns:  None identified. Facility resident.  Social Worker assessment / plan:  Patient advised that she had been at [facility] since 10/16, she uses both a walker and wheelchair and that staff assist her with ADLs.  She stated that her husband and daughter visit her daily.  Patient advised that she plans on returning to the facility upon discharge to complete rehab. Patient also advised that she is a dialysis patient.  Debbie confirmed patient's statements.  She advised that patient can return to the facility at discharge.   Employment status:  Retired Forensic scientist:  Medicare PT Recommendations:  [place] / Referral to community resources:     Patient/Family's Response to care:  Patient is agreeable to return to [facility] to complete rehab at [facility].   Patient/Family's Understanding of and Emotional Response to Diagnosis, Current Treatment, and Prognosis:  Patient stated that she was advised that she had pneumonia.  She indicated that she became ill yesterday while receiving dialysis  treatment.    Emotional Assessment Appearance:  Appears stated age Attitude/Demeanor/Rapport:   (Pleasant and Cooperative) Affect (typically observed):  Accepting, Calm Orientation:  Oriented to Situation, Oriented to  Time, Oriented to Place, Oriented to Self Alcohol / Substance use:  Not Applicable Psych involvement (Current and /or in the community):  No (Comment)  Discharge Needs  Concerns to be addressed:  Discharge Planning Concerns Readmission within the last 30 days:    Current discharge risk:  None Barriers to Discharge:  No Barriers Identified   Ihor Gully, LCSW 08/20/2015, 11:09 AM

## 2015-08-21 ENCOUNTER — Inpatient Hospital Stay (HOSPITAL_COMMUNITY): Payer: Medicare Other

## 2015-08-21 MED ORDER — LABETALOL HCL 200 MG PO TABS
300.0000 mg | ORAL_TABLET | Freq: Two times a day (BID) | ORAL | Status: DC
  Administered 2015-08-21 – 2015-08-22 (×2): 300 mg via ORAL
  Filled 2015-08-21 (×2): qty 2
  Filled 2015-08-21: qty 1
  Filled 2015-08-21: qty 2

## 2015-08-21 MED ORDER — EPOETIN ALFA 4000 UNIT/ML IJ SOLN
INTRAMUSCULAR | Status: AC
  Administered 2015-08-21: 3000 [IU]
  Filled 2015-08-21: qty 1

## 2015-08-21 MED ORDER — EPOETIN ALFA 3000 UNIT/ML IJ SOLN
3000.0000 [IU] | INTRAMUSCULAR | Status: DC
  Administered 2015-08-23: 3000 [IU] via SUBCUTANEOUS
  Filled 2015-08-21 (×2): qty 1

## 2015-08-21 NOTE — Progress Notes (Signed)
Awaiting transport back to room

## 2015-08-21 NOTE — Progress Notes (Signed)
Dr. Sarajane Jews notified of pt's BP and anxiety.  Verbal order to give Lisinopril and Labetalol received.  Pt's husband is at bedside and states that she does upset easily.

## 2015-08-21 NOTE — Progress Notes (Signed)
Subjective: Patient complains of cough. No sputum production. She denies any fevers chills or sweating. She denies any difficulty breathing   Objective: Vital signs in last 24 hours: Temp:  [97.4 F (36.3 C)-99.1 F (37.3 C)] 98.4 F (36.9 C) (01/19 0615) Pulse Rate:  [58-75] 67 (01/19 0615) Resp:  [14-20] 20 (01/19 0615) BP: (90-202)/(49-69) 202/69 mmHg (01/19 0615) SpO2:  [91 %-100 %] 100 % (01/19 0615) Weight:  [164 lb 4.8 oz (74.526 kg)] 164 lb 4.8 oz (74.526 kg) (01/19 0700)  Intake/Output from previous day:   Intake/Output this shift:     Recent Labs  08/19/15 1311 08/20/15 0158  HGB 10.7* 9.9*    Recent Labs  08/19/15 1311 08/20/15 0158  WBC 3.4* 3.4*  RBC 4.24 4.04  HCT 32.9* 31.1*  PLT 164 121*    Recent Labs  08/19/15 1311 08/20/15 0158  NA 137 136  K 3.3* 3.4*  CL 96* 98*  CO2 30 28  BUN 17 25*  CREATININE 3.75* 4.47*  GLUCOSE 89 91  CALCIUM 8.7* 8.6*   No results for input(s): LABPT, INR in the last 72 hours.  Generally patient is alert in no apparent distress Chest she has expiratory wheezing Heart exam revealed regular rate and rhythm Extremities no edema  Assessment/Plan: Problem #1 end-stage renal disease: She is status post hemodialysis yesterday for a short duration. Presently she doesn't have any nausea or vomiting. Her potassium postdialysis was low. Problem #2 uncontrolled hypertension: Her blood pressure was very high yesterday. Unfortunately she was not given labetalol before she comes to dialysis possibly that has continued to have high blood pressure. Presently her blood pressure is better. Problem #3 anemia: Her hemoglobin is below our target goal Problem #4 cough. Presently she is a febrile and her white blood cell count is normal. Problem #5 hypothyroidism Problem #6 metabolic bone disease: Her calcium is in range. Phosphorus is not available. Plan: We'll dialyze patient today and will remove about 3 L if her blood pressure  tolerates We'll change labetalol to 300 mg by mouth twice a day We'll check a basic metabolic panel, phosphorus in the morning. We'll give her Epogen 10,000 units after his dialysis.    Ashland Osmer S 08/21/2015, 8:30 AM

## 2015-08-21 NOTE — Progress Notes (Signed)
PROGRESS NOTE  Sonya [name] [id number] DOB: 11-Oct-1950 DOA: 08/19/2015 PCP: [name], MD  Summary: 49 yof with a hx of ESRD on HD, HTN, and hypothyroidism presented with blurred vision and a cough. While in the ED, she was found to have severe hypertension. CXR revealed CHF as well as a possible right medial lung infiltrate. She was started on IV abx and admitted for further management.   Assessment/Plan: 1. Hypertensive urgency, secondary to pulmonary edema secondary to volume overload. 2. Pulmonary edema secondary to volume overload. Management per nephrology. 3. Possible CAP. Continue IV abx empirically. Afebrile, WBC wnl. Appears stable.  4. Acute hypoxic respiratory failure secondary to pulmonary edema and pneumonia. 5. ESRD on HD.   6. Hypothyroidism, continue synthroid.    Overall improved, more alert. Continue empiric antibiotics, hemodialysis as per nephrology. Wean oxygen as tolerated.  Code Status: Full DVT prophylaxis: Heparin  Family Communication:   Disposition Plan: Pending  Murray Hodgkins, MD  [facility]  Pager [phone] If 7PM-7AM, please contact night-coverage at [email or web address], password [email or web address] 08/21/2015, 6:51 AM  LOS: 2 days   Consultants:  Nephrology  Procedures:    Antibiotics:  Levaquin 1/17>>  Vancomycin 1/17>>  HPI/Subjective: Feels better, breathing ok.  Objective: Filed Vitals:   08/20/15 1658 08/20/15 2002 08/20/15 2038 08/21/15 0615  BP: 136/67 147/54  202/69  Pulse: 67 72  67  Temp: 99.1 F (37.3 C) 97.6 F (36.4 C)  98.4 F (36.9 C)  TempSrc:  Axillary  Oral  Resp:  20  20  Height:      Weight:      SpO2: 97% 91% 96% 100%   No intake or output data in the 24 hours ending 08/21/15 0651   Filed Weights   08/19/15 1138 08/19/15 1839  Weight: 60.782 kg (134 lb) 53.9 kg (118 lb 13.3 oz)    Exam:     Afebrile, hypertensive  General:  Appears calm and comfortable Cardiovascular: RRR, no m/r/g. No LE  edema. Telemetry: SR, no arrhythmias  Respiratory: CTA bilaterally, no w/r/r. Normal respiratory effort. Psychiatric: grossly normal mood and affect, speech fluent and appropriate Neurologic: grossly non-focal.  New data reviewed:  No new data  Pertinent data since admission:  Potassium 3.3  Creatinine 3.75  BNP 3538   Scheduled Meds: . [START ON 08/24/2015] cloNIDine  0.3 mg Transdermal Weekly  . epoetin (EPOGEN/PROCRIT) injection  3,000 Units Subcutaneous Q T,Th,Sa-HD  . feeding supplement (NEPRO CARB STEADY)  237 mL Oral BID BM  . furosemide  80 mg Intravenous BID  . heparin  5,000 Units Subcutaneous 3 times per day  . hydrALAZINE  100 mg Oral 4 times per day  . labetalol  600 mg Oral BID  . levofloxacin (LEVAQUIN) IV  500 mg Intravenous Q48H  . levothyroxine  50 mcg Oral QAC breakfast  . lisinopril  40 mg Oral Daily  . NIFEdipine  90 mg Oral Daily  . pantoprazole  40 mg Oral Daily  . senna-docusate  1 tablet Oral BID  . sevelamer carbonate  2,400 mg Oral TID WC  . sodium chloride  3 mL Intravenous Q12H  . vancomycin  750 mg Intravenous Q T,Th,Sa-HD   Continuous Infusions:   Principal Problem:   Pulmonary edema Active Problems:   Essential hypertension   Hypertensive urgency   End stage renal disease (HCC)   Hypertensive emergency   Protein-calorie malnutrition, severe ([place])   Uncontrolled hypertension   Hypothyroidism, adult   CAP (community acquired pneumonia)  Time spent 25 minutes    By signing my name below, I, Rosalie Doctor attest that this documentation has been prepared under the direction and in the presence of Murray Hodgkins, MD Electronically signed: Rosalie Doctor, Scribe. 08/21/2015   I personally performed the services described in this documentation. All medical record entries made by the scribe were at my direction. I have reviewed the chart and agree that the record reflects my personal performance and is accurate and  complete. Murray Hodgkins, MD

## 2015-08-21 NOTE — Progress Notes (Signed)
Pt transported back to room by [facility] RN.

## 2015-08-21 NOTE — Progress Notes (Signed)
Initial Nutrition Assessment  DOCUMENTATION CODES:  Not applicable  INTERVENTION:  Pt declined supplements. Will monitor oral intake and reassess need for supplements/snacks as warranted.  NUTRITION DIAGNOSIS:  Increased nutrient needs related to chronic illness (ESRD ON HD) as evidenced by estimated nutritional requirements for this condition.   GOAL:  Patient will meet greater than or equal to 90% of their needs  MONITOR:  PO intake, Labs, Weight trends, I & O's  REASON FOR ASSESSMENT:  Malnutrition Screening Tool    ASSESSMENT:  65 y/o female PMHx ESRD on HD, severe HTN, hypothyroidism, malnutrition, ICH, GERD, Cancer, anemia. Presents with blurry vision after HD. Found to have severe HTN, CHF, and potential lung infiltrate.  Spoke with pt while she was undergoing dialysis. Pt reports that she has a "fair" appetite. She tries to eat 3 meals each day. She eats a protein bar while on dialyisis. She takes a renal mvi.   When asked if she followed a renal diet at home She stated that if she really likes a food, she will eat it. Pt reports difficulty following the renal diet: "I dont like it". Pt is currently residing at [facility] for rehabilitation related to fracture. Encouraged pt to talk to facility RD about what changes they could make to their meals help her enjoy them more. She does say that she really enjoys meat and eats this frequently. RD reccommended that she try to eat her protein first at each meal as this is most important for her. Pt denies n/v/c/d.    Her pre dialysis wt today was 165 lbs. Pt reports this sounds about right. Pt does believe she has lost some weight recently, though unable to quantify. Pt's weight measurements during this admit due not appear to be reliable as they differ as much as 50 lbs.   Pt does not like nepro at all. She declined supplements. She was eating lunch on RD arrival. Told pt to ask for RD if she has trouble picking out meals.   NFPE: Unable  to conduct.   Diet Order:  Diet renal with fluid restriction Fluid restriction:: 1200 mL Fluid; Room service appropriate?: Yes; Fluid consistency:: Thin  Skin:  Dry, Ecchymotic  Last BM:  1/18  Height:  Ht Readings from Last 1 Encounters:  08/19/15 5\' 4"  (1.626 m)   Weight:  Wt Readings from Last 1 Encounters:  08/21/15 165 lb 6.4 oz (75.025 kg)   Wt Readings from Last 10 Encounters:  08/21/15 165 lb 6.4 oz (75.025 kg)  08/09/15 165 lb (74.844 kg)  06/19/15 140 lb (63.504 kg)  06/15/15 137 lb 9.1 oz (62.4 kg)  06/03/15 139 lb 12.4 oz (63.4 kg)  05/13/15 136 lb 9.6 oz (61.961 kg)  04/24/15 130 lb (58.968 kg)  04/17/15 136 lb 11 oz (62 kg)  11/27/14 146 lb (66.225 kg)  11/26/14 146 lb (66.225 kg)   Ideal Body Weight:  54.54 kg  BMI:  Body mass index is 28.38 kg/(m^2).  Standard bw for height: 60 KG  Estimated Nutritional Needs:  Kcal:  1800-2000 (30-33 kcals/kg sbw) Protein:  72-84 g/kg bw ( 1.2-1.4 g/kg sbw) Fluid:  1.2 liters fluid  EDUCATION NEEDS:  No education needs identified at this time  Burtis Junes RD, LDN Nutrition Pager: [phone] 08/21/2015 2:07 PM

## 2015-08-21 NOTE — Progress Notes (Signed)
Hemodialysis- pt tolerated procedure well. Ran 4 hours, Net UF=3L without issue. Remains hypertensive. Notified primary RN when calling report. Pt has no complaints.

## 2015-08-22 ENCOUNTER — Inpatient Hospital Stay (HOSPITAL_COMMUNITY): Payer: Medicare Other

## 2015-08-22 DIAGNOSIS — R0600 Dyspnea, unspecified: Secondary | ICD-10-CM

## 2015-08-22 LAB — BASIC METABOLIC PANEL
Anion gap: 9 (ref 5–15)
BUN: 17 mg/dL (ref 6–20)
CALCIUM: 8.8 mg/dL — AB (ref 8.9–10.3)
CO2: 30 mmol/L (ref 22–32)
CREATININE: 3.49 mg/dL — AB (ref 0.44–1.00)
Chloride: 97 mmol/L — ABNORMAL LOW (ref 101–111)
GFR calc Af Amer: 15 mL/min — ABNORMAL LOW (ref >60)
GFR, EST NON AFRICAN AMERICAN: 13 mL/min — AB (ref >60)
GLUCOSE: 81 mg/dL (ref 65–99)
Potassium: 3.8 mmol/L (ref 3.5–5.1)
Sodium: 136 mmol/L (ref 135–145)

## 2015-08-22 LAB — CBC
HCT: 33 % — ABNORMAL LOW (ref 36.0–46.0)
Hemoglobin: 10.2 g/dL — ABNORMAL LOW (ref 12.0–15.0)
MCH: 23.7 pg — ABNORMAL LOW (ref 26.0–34.0)
MCHC: 30.9 g/dL (ref 30.0–36.0)
MCV: 76.7 fL — ABNORMAL LOW (ref 78.0–100.0)
Platelets: 185 10*3/uL (ref 150–400)
RBC: 4.3 MIL/uL (ref 3.87–5.11)
RDW: 19.1 % — AB (ref 11.5–15.5)
WBC: 3 10*3/uL — ABNORMAL LOW (ref 4.0–10.5)

## 2015-08-22 LAB — PHOSPHORUS: PHOSPHORUS: 2.1 mg/dL — AB (ref 2.5–4.6)

## 2015-08-22 MED ORDER — CLONIDINE HCL 0.3 MG/24HR TD PTWK
0.3000 mg | MEDICATED_PATCH | TRANSDERMAL | Status: DC
  Administered 2015-08-22: 0.3 mg via TRANSDERMAL
  Filled 2015-08-22: qty 1

## 2015-08-22 MED ORDER — DOXAZOSIN MESYLATE 2 MG PO TABS
4.0000 mg | ORAL_TABLET | Freq: Every day | ORAL | Status: DC
  Administered 2015-08-22 – 2015-08-23 (×2): 4 mg via ORAL
  Filled 2015-08-22 (×2): qty 2

## 2015-08-22 MED ORDER — LISINOPRIL 10 MG PO TABS
40.0000 mg | ORAL_TABLET | Freq: Every day | ORAL | Status: DC
  Administered 2015-08-23: 40 mg via ORAL
  Filled 2015-08-22: qty 4

## 2015-08-22 MED ORDER — LABETALOL HCL 200 MG PO TABS
600.0000 mg | ORAL_TABLET | Freq: Two times a day (BID) | ORAL | Status: DC
  Administered 2015-08-22: 600 mg via ORAL
  Filled 2015-08-22: qty 3

## 2015-08-22 MED ORDER — LABETALOL HCL 200 MG PO TABS: 400.0000 mg | ORAL_TABLET | Freq: Two times a day (BID) | ORAL | Status: DC

## 2015-08-22 NOTE — Progress Notes (Signed)
PROGRESS NOTE  Sonya [name] [id number] DOB: [birth year] DOA: 08/19/2015 PCP: [name], MD  Summary: 73 yof with a hx of ESRD on HD, HTN, and hypothyroidism presented with blurred vision and a cough. While in the ED, she was found to have severe hypertension. CXR revealed CHF as well as a possible right medial lung infiltrate. She was started on IV abx and admitted for further management.   Assessment/Plan: 1. Hypertensive urgency, asymptomatic, but poor control; secondary pulmonary edema/volume overload, chronic hypertension. No evidence of new end organ dysfunction. Mental status appears to be at baseline. 2. Pulmonary edema secondary to volume overload. Improving with hemodialysis. 3. Community-acquired pneumonia. Continues to improve. Remains afebrile. Hypoxia has resolved. 4. Acute hypoxic respiratory failure secondary to pneumonia and pulmonary edema, resolved. 5. End-stage renal disease. Continue hemodialysis, last treatment 1/19. 6. Hypothyroidism. Stable. Continue Synthroid.    Overall improving, more alert. Continue empiric antibiotics, hemodialysis as per nephrology.    Will adjust antihypertensives.  Anticipate discharge back to rehab in 1-2 days if continues to improve   Code Status: Full DVT prophylaxis: Heparin  Family Communication:  Family at bedside.  Disposition Plan: Anticipate discharge back to rehab within 1-2 days.   Murray Hodgkins, MD  [facility]  Pager [phone] If 7PM-7AM, please contact night-coverage at [email or web address], password [facility] 08/22/2015, 6:44 AM  LOS: 3 days   Consultants:  Nephrology  Procedures:  HD 1/19 with removal of 3L.   Antibiotics:  Levaquin 1/17>>  Vancomycin 1/17>>  HPI/Subjective: Feels okay. Attempting to eat. Denies any nausea, vomiting, or pain.   Objective: Filed Vitals:   08/21/15 2034 08/21/15 2059 08/22/15 0055 08/22/15 0637  BP: 213/72  212/71 238/79  Pulse: 63  69 69  Temp: 98.4 F (36.9  C)   98.6 F (37 C)  TempSrc: Oral   Oral  Resp: 15   18  Height:      Weight:    74.481 kg (164 lb 3.2 oz)  SpO2: 98% 97%  93%    Intake/Output Summary (Last 24 hours) at 08/22/15 0644 Last data filed at 08/21/15 1907  Gross per 24 hour  Intake    240 ml  Output   3050 ml  Net  -2810 ml     Filed Weights   08/21/15 1015 08/21/15 1415 08/22/15 0637  Weight: 75.025 kg (165 lb 6.4 oz) 72 kg (158 lb 11.7 oz) 74.481 kg (164 lb 3.2 oz)    Exam:     Afebrile, hypertensive  General:  Appears comfortable, calm. Sitting up in bed eating lunch  Cardiovascular: Regular rate and rhythm, no murmur, rub or gallop. Minimal extremity edema. Respiratory: Clear to auscultation bilaterally, no wheezes, rales or rhonchi. Normal respiratory effort. Musculoskeletal: grossly normal tone bilateral upper and lower extremities Psychiatric: grossly normal mood and affect, speech fluent and appropriate  New data reviewed:  Creatinine improved - 3.49. BMP consistent with ESRD  WBC 3.0, Hgb 10.2, platelets have recovered  Pertinent data since admission:  Potassium 3.3  Creatinine 3.75  BNP 3538   Scheduled Meds: . cloNIDine  0.3 mg Transdermal Weekly  . epoetin (EPOGEN/PROCRIT) injection  3,000 Units Subcutaneous Q T,Th,Sa-HD  . feeding supplement (NEPRO CARB STEADY)  237 mL Oral BID BM  . furosemide  80 mg Intravenous BID  . heparin  5,000 Units Subcutaneous 3 times per day  . hydrALAZINE  100 mg Oral 4 times per day  . labetalol  300 mg Oral BID  . levofloxacin (LEVAQUIN) IV  500 mg Intravenous Q48H  . levothyroxine  50 mcg Oral QAC breakfast  . lisinopril  40 mg Oral Daily  . NIFEdipine  90 mg Oral Daily  . pantoprazole  40 mg Oral Daily  . senna-docusate  1 tablet Oral BID  . sevelamer carbonate  2,400 mg Oral TID WC  . sodium chloride  3 mL Intravenous Q12H  . vancomycin  750 mg Intravenous Q T,Th,Sa-HD   Continuous Infusions:   Principal Problem:   Pulmonary  edema Active Problems:   Essential hypertension   Hypertensive urgency   End stage renal disease ([place])   Hypertensive emergency   Protein-calorie malnutrition, severe ([place])   Uncontrolled hypertension   Hypothyroidism, adult   CAP (community acquired pneumonia)   Time spent 25 minutes   By signing my name below, I, Rosalie Doctor attest that this documentation has been prepared under the direction and in the presence of Murray Hodgkins, MD Electronically signed: Rosalie Doctor, Scribe. 08/22/2015  12:23pm  I personally performed the services described in this documentation. All medical record entries made by the scribe were at my direction. I have reviewed the chart and agree that the record reflects my personal performance and is accurate and complete. Murray Hodgkins, MD

## 2015-08-22 NOTE — Clinical Social Work Note (Signed)
CSW provided a status update to Debbie at [facility] and advised that patient would likely discharge this weekend.  Debbie advised that if patient discharged this weekend to contact her cell number at [phone].   Mekala Winger D, LCSW.

## 2015-08-22 NOTE — Progress Notes (Signed)
Pharmacy Antibiotic Follow-up Note  Sonya [name] is a 65 y.o. year-old female admitted on 08/19/2015.  The patient is currently on day 4 of Vancomycin/Levaquin for CAP in patient w ESRD on HD.  Assessment/Plan: This patient's current antibiotics will be continued without adjustments.  F/U length of therapy.  Temp (24hrs), Avg:98.2 F (36.8 C), Min:97.9 F (36.6 C), Max:98.6 F (37 C)   Recent Labs Lab 08/19/15 1311 08/20/15 0158 08/22/15 0637  WBC 3.4* 3.4* 3.0*    Recent Labs Lab 08/19/15 1311 08/20/15 0158 08/22/15 0637  CREATININE 3.75* 4.47* 3.49*   Estimated Creatinine Clearance: 15.7 mL/min (by C-G formula based on Cr of 3.49).    Allergies  Allergen Reactions  . Cephalexin Itching    Antimicrobials this admission: Vancomycin 1/19 >>  Levaquin 1/19 >>   Levels/dose changes this admission: n/a  Microbiology results: None  Thank you for allowing pharmacy to be a part of this patient's care.  Pricilla Larsson PharmD 08/22/2015 1:13 PM

## 2015-08-22 NOTE — Progress Notes (Signed)
Patient's BP continues to be high this AM. Contacted hospitalist. Order received to give morning labetalol now.

## 2015-08-22 NOTE — Progress Notes (Signed)
Subjective: Patient still complains of dry cough. She denies any fevers chills or sweating. She didn't have any difficulty in breathing. Patient also with poor appetite.  Objective: Vital signs in last 24 hours: Temp:  [97.7 F (36.5 C)-98.6 F (37 C)] 97.9 F (36.6 C) (01/20 0824) Pulse Rate:  [59-69] 65 (01/20 0824) Resp:  [15-20] 16 (01/20 0824) BP: (186-240)/(71-102) 227/79 mmHg (01/20 0824) SpO2:  [93 %-100 %] 100 % (01/20 0824) Weight:  [158 lb 11.7 oz (72 kg)-165 lb 6.4 oz (75.025 kg)] 164 lb 3.2 oz (74.481 kg) (01/20 [id number])  Intake/Output from previous day: 01/19 0701 - 01/20 0700 In: 240 [P.O.:240] Out: 3050 [Urine:50] Intake/Output this shift: Total I/O In: -  Out: 75 [Urine:75]   Recent Labs  08/19/15 1311 08/20/15 0158 08/22/15 0637  HGB 10.7* 9.9* 10.2*    Recent Labs  08/20/15 0158 08/22/15 0637  WBC 3.4* 3.0*  RBC 4.04 4.30  HCT 31.1* 33.0*  PLT 121* 185    Recent Labs  08/20/15 0158 08/22/15 0637  NA 136 136  K 3.4* 3.8  CL 98* 97*  CO2 28 30  BUN 25* 17  CREATININE 4.47* 3.49*  GLUCOSE 91 81  CALCIUM 8.6* 8.8*   No results for input(s): LABPT, INR in the last 72 hours.  Generally patient is alert in no apparent distress Chest she has expiratory wheezing Heart exam revealed regular rate and rhythm Extremities no edema  Assessment/Plan: Problem #1 end-stage renal disease: She is status post hemodialysis yesterday . Presently she is asymptomatic. Problem #2 uncontrolled hypertension: Her blood pressure still remains high. Presently she is on labetalol/hydralazine/nifedipine/lisinopril/clonidine. Problem #3 anemia: Her hemoglobin is below our target goal Problem #4 cough. Presently she is a febrile and her white blood cell count is normal. Patient on antibiotics. Still she continued to cough. Since she is on lisinopril at this moment no sure whether that is contributing. Problem #5 hypothyroidism: On Synthroid. Problem #6 metabolic bone  disease: Her calcium is in range however her phosphorus is low. Presently she is on Renvela. Plan: 1] patient doesn't require dialysis 2] We'll dialyze patient tomorrow and will remove about 3 L if her blood pressure tolerates 3] We'll change labetalol to 400 mg by mouth twice a day 4] We'll DC lisinopril 5] we'll add Cardura 4 mg by mouth daily at bedtime 6] DC Lasix 7] we'll DC Renvela.   Ronald Vinsant S 08/22/2015, 8:44 AM

## 2015-08-22 NOTE — Progress Notes (Signed)
Patient's SBP continues to be in the 200s. Does not have any more BP meds due till 0600. Contacted hospitalist. Informed her that there is an order for a clonidine patch that is to start on 1/22. Patient is not wearing a clonidine patch right now. Received order to apply clonidine patch now. Will continue to monitor closely.

## 2015-08-23 DIAGNOSIS — I161 Hypertensive emergency: Secondary | ICD-10-CM

## 2015-08-23 LAB — RENAL FUNCTION PANEL
ALBUMIN: 2.7 g/dL — AB (ref 3.5–5.0)
Anion gap: 8 (ref 5–15)
BUN: 30 mg/dL — AB (ref 6–20)
CO2: 29 mmol/L (ref 22–32)
CREATININE: 4.99 mg/dL — AB (ref 0.44–1.00)
Calcium: 8.8 mg/dL — ABNORMAL LOW (ref 8.9–10.3)
Chloride: 96 mmol/L — ABNORMAL LOW (ref 101–111)
GFR calc Af Amer: 10 mL/min — ABNORMAL LOW (ref >60)
GFR, EST NON AFRICAN AMERICAN: 8 mL/min — AB (ref >60)
GLUCOSE: 77 mg/dL (ref 65–99)
PHOSPHORUS: 2.3 mg/dL — AB (ref 2.5–4.6)
POTASSIUM: 4.1 mmol/L (ref 3.5–5.1)
Sodium: 133 mmol/L — ABNORMAL LOW (ref 135–145)

## 2015-08-23 LAB — CBC
HEMATOCRIT: 31.4 % — AB (ref 36.0–46.0)
Hemoglobin: 10 g/dL — ABNORMAL LOW (ref 12.0–15.0)
MCH: 23.9 pg — ABNORMAL LOW (ref 26.0–34.0)
MCHC: 31.8 g/dL (ref 30.0–36.0)
MCV: 75.1 fL — AB (ref 78.0–100.0)
PLATELETS: 170 10*3/uL (ref 150–400)
RBC: 4.18 MIL/uL (ref 3.87–5.11)
RDW: 19.3 % — AB (ref 11.5–15.5)
WBC: 2.4 10*3/uL — AB (ref 4.0–10.5)

## 2015-08-23 MED ORDER — PENTAFLUOROPROP-TETRAFLUOROETH EX AERO: 1.0000 "application " | INHALATION_SPRAY | CUTANEOUS | Status: DC | PRN

## 2015-08-23 MED ORDER — LABETALOL HCL 200 MG PO TABS: 400.0000 mg | ORAL_TABLET | Freq: Two times a day (BID) | ORAL | Status: DC

## 2015-08-23 MED ORDER — EPOETIN ALFA 4000 UNIT/ML IJ SOLN
INTRAMUSCULAR | Status: AC
  Administered 2015-08-23: 4000 [IU]
  Filled 2015-08-23: qty 1

## 2015-08-23 MED ORDER — SODIUM CHLORIDE 0.9 % IV SOLN: 100.0000 mL | INTRAVENOUS | Status: DC | PRN

## 2015-08-23 MED ORDER — LORAZEPAM 0.5 MG PO TABS
0.5000 mg | ORAL_TABLET | Freq: Four times a day (QID) | ORAL | Status: DC | PRN
  Administered 2015-08-23: 0.5 mg via ORAL
  Filled 2015-08-23: qty 1

## 2015-08-23 MED ORDER — LABETALOL HCL 200 MG PO TABS
600.0000 mg | ORAL_TABLET | Freq: Two times a day (BID) | ORAL | Status: DC
  Administered 2015-08-23 – 2015-08-24 (×2): 600 mg via ORAL
  Filled 2015-08-23 (×2): qty 3

## 2015-08-23 MED ORDER — HYDRALAZINE HCL 20 MG/ML IJ SOLN: 10.0000 mg | Freq: Once | INTRAMUSCULAR | Status: DC

## 2015-08-23 NOTE — Progress Notes (Signed)
Subjective: Patient is feeling better. She has some cough last night and there is no sputum production. She denies also any chest pain and no difficulty breathing.  Objective: Vital signs in last 24 hours: Temp:  [97.7 F (36.5 C)-98.2 F (36.8 C)] 98.2 F (36.8 C) (01/20 2157) Pulse Rate:  [58-68] 68 (01/20 2157) Resp:  [18-20] 20 (01/20 2157) BP: (103-218)/(49-76) 218/76 mmHg (01/20 2157) SpO2:  [96 %-100 %] 100 % (01/20 2157) Weight:  [162 lb 14.7 oz (73.9 kg)] 162 lb 14.7 oz (73.9 kg) (01/21 0446)  Intake/Output from previous day: 01/20 0701 - 01/21 0700 In: 240 [P.O.:240] Out: 75 [Urine:75] Intake/Output this shift:     Recent Labs  08/22/15 0637  HGB 10.2*    Recent Labs  08/22/15 0637  WBC 3.0*  RBC 4.30  HCT 33.0*  PLT 185    Recent Labs  08/22/15 0637  NA 136  K 3.8  CL 97*  CO2 30  BUN 17  CREATININE 3.49*  GLUCOSE 81  CALCIUM 8.8*   No results for input(s): LABPT, INR in the last 72 hours.  Generally patient is alert in no apparent distress Chest she has expiratory wheezing Heart exam revealed regular rate and rhythm Extremities no edema  Assessment/Plan: Problem #1 end-stage renal disease: She is status post hemodialysis on Thursday. Patient presently eating her breakfast she denies any nausea and vomiting. Problem #2 uncontrolled hypertension: Her blood pressure is fluctuating. Most likely from recurrent agitation. Problem #3 anemia: Her hemoglobin is below our target goal. She is on Epogen Problem #4 cough: Lisinopril was discontinued and patient is on antibiotic for pneumonia. Presently her cough is better. Problem #5 hypothyroidism: On Synthroid. Problem #6 metabolic bone disease: Her calcium is in range however her phosphorus is low. Heparin failure was discontinued yesterday. Plan: 1] we'll dialyze patient today for 4 hours and remove 3 L. 2] we'll continue his other treatment as before.    Sonya [name] S 08/23/2015, 8:31 AM

## 2015-08-23 NOTE — Progress Notes (Addendum)
Pt confused and combative. Refused to allow vitals to be taken and refused any more meds tonight.  Md notified.  New orders from Md - 95ml of Hydralazine IV.

## 2015-08-23 NOTE — Procedures (Signed)
   HEMODIALYSIS TREATMENT NOTE:  4 hour heparin-free dialysis completed via right upper arm AVF (15g/antegrade). Goal met: 3.5 liters removed without interruption in ultrafiltration. Pt was hypertensive pre-, intra-, and post-dialysis with SBP>230 and DBP>100.  She became agitated and restless 1.5 hours into HD and lorazepam was given. Pt remained restless and fidgety and ultimately removed her left forearm PIV. All blood was returned and hemostasis was achieved within 20 minutes. Report called to Janace Aris, RN.  Rockwell Alexandria, RN, CDN

## 2015-08-23 NOTE — Progress Notes (Signed)
PROGRESS NOTE  Sonya [name] [id number] DOB: 1950/08/20 DOA: 08/19/2015 PCP: [name], MD  Summary: 76 yof with a hx of ESRD on HD, HTN, and hypothyroidism presented with blurred vision and a cough. While in the ED, she was found to have severe hypertension. CXR revealed CHF as well as a possible right medial lung infiltrate. She was started on IV abx and admitted for further management.   Assessment/Plan: 1. Hypertensive urgency, remains asymptomatic, blood pressure is very labile. Today she is quite hypertensive again but is asymptomatic. Suspect underlying severe hypertension complicated by pulmonary edema and volume overload. Mental status appears to be at baseline and there is no evidence of new end organ dysfunction. 2. Pulmonary edema secondary to volume overload this appears resolved clinically.  3. Suspected acute on chronic diastolic congestive heart failure in combination with volume overload and end-stage renal disease. Improving with hemodialysis. Continue beta blocker. 4. Community acquired pneumonia. Appears clinically resolved. Hypoxia has resolved. 5. Acute hypoxic respiratory failure secondary to pneumonia. Resolved. 6. End-stage renal disease. Plans for dialysis today. 7. Hypothyroidism. Stable. Continue Synthroid.    Overall improved. Change to oral antibiotics. Dialysis today. Monitor blood pressure.   Anticipate discharge back to rehab tomorrow.  Code Status: Full DVT prophylaxis: Heparin  Family Communication: Husband at bedside.  Disposition Plan: Anticipate discharge back to rehab tomorrow.  Murray Hodgkins, MD  [facility]  Pager [phone] If 7PM-7AM, please contact night-coverage at [email or web address], password [facility] 08/23/2015, 6:37 AM  LOS: 4 days   Consultants:  Nephrology  Procedures:  HD 1/19 with removal of 3L.   ECHO Study Conclusions  - Left ventricle: The cavity size was normal. Wall thickness was increased in a pattern  of severe LVH. Systolic function was normal. The estimated ejection fraction was in the range of 60% to 65%. Wall motion was normal; there were no regional wall motion abnormalities. Features are consistent with a pseudonormal left ventricular filling pattern, with concomitant abnormal relaxation and increased filling pressure (grade 2 diastolic dysfunction). Doppler parameters are consistent with high ventricular filling pressure. - Aortic valve: Valve area (VTI): 2.18 cm^2. Valve area (Vmax): 2.41 cm^2. - Mitral valve: There was mild regurgitation. - Left atrium: The atrium was severely dilated. - Right atrium: The atrium was moderately to severely dilated. - Atrial septum: No defect or patent foramen ovale was identified. - Tricuspid valve: There was mild-moderate regurgitation. - Pulmonary arteries: Systolic pressure was moderately increased. PA peak pressure: 42 mm Hg (S). - Inferior vena cava: The vessel was dilated. The respirophasic diameter changes were blunted (< 50%), consistent with elevated central venous pressure. - Pericardium, extracardiac: There is a large left pleural effusion - Technically adequate study.  Antibiotics:  Levaquin 1/17>>  Vancomycin 1/17>>  HPI/Subjective: Feels okay. Still has a cough but breathing is all right. Denies any pain, nausea, or vomiting.   Objective: Filed Vitals:   08/22/15 1820 08/22/15 2021 08/22/15 2157 08/23/15 0446  BP: 176/63  218/76   Pulse: 68  68   Temp:   98.2 F (36.8 C)   TempSrc:   Oral   Resp:   20   Height:      Weight:    73.9 kg (162 lb 14.7 oz)  SpO2:  96% 100%     Intake/Output Summary (Last 24 hours) at 08/23/15 0637 Last data filed at 08/22/15 1700  Gross per 24 hour  Intake    240 ml  Output     75 ml  Net  165 ml     Filed Weights   08/21/15 1415 08/22/15 0637 08/23/15 0446  Weight: 72 kg (158 lb 11.7 oz) 74.481 kg (164 lb 3.2 oz) 73.9 kg (162 lb 14.7 oz)     Exam:     Afebrile, hypertensive  General:  Appears calm and comfortable Cardiovascular: RRR, no m/r/g. No LE edema. Respiratory: CTA bilaterally, no w/r/r. Normal respiratory effort. Abdomen: soft, ntnd Musculoskeletal: grossly normal tone BUE/BLE Psychiatric: grossly normal mood and affect, speech fluent and appropriate Neurologic: grossly non-focal.   New data reviewed:  ECHO noted  Pertinent data since admission:  Potassium 3.3  Creatinine 3.75  BNP 3538   Scheduled Meds: . cloNIDine  0.3 mg Transdermal Weekly  . doxazosin  4 mg Oral Daily  . epoetin (EPOGEN/PROCRIT) injection  3,000 Units Subcutaneous Q T,Th,Sa-HD  . feeding supplement (NEPRO CARB STEADY)  237 mL Oral BID BM  . heparin  5,000 Units Subcutaneous 3 times per day  . hydrALAZINE  10 mg Intravenous Once  . hydrALAZINE  100 mg Oral 4 times per day  . labetalol  600 mg Oral BID  . levofloxacin (LEVAQUIN) IV  500 mg Intravenous Q48H  . levothyroxine  50 mcg Oral QAC breakfast  . lisinopril  40 mg Oral Daily  . NIFEdipine  90 mg Oral Daily  . pantoprazole  40 mg Oral Daily  . senna-docusate  1 tablet Oral BID  . sodium chloride  3 mL Intravenous Q12H  . vancomycin  750 mg Intravenous Q T,Th,Sa-HD   Continuous Infusions:   Principal Problem:   Pulmonary edema Active Problems:   Essential hypertension   Hypertensive urgency   End stage renal disease ([place])   Hypertensive emergency   Protein-calorie malnutrition, severe ([place])   Uncontrolled hypertension   Hypothyroidism, adult   CAP (community acquired pneumonia)   Time spent 25 minutes   By signing my name below, I, Rosalie Doctor attest that this documentation has been prepared under the direction and in the presence of Murray Hodgkins, MD Electronically signed: Rosalie Doctor, Scribe. 08/23/2015   10:31am     I personally performed the services described in this documentation. All medical record entries made by the scribe  were at my direction. I have reviewed the chart and agree that the record reflects my personal performance and is accurate and complete. Murray Hodgkins, MD

## 2015-08-24 DIAGNOSIS — Z992 Dependence on renal dialysis: Secondary | ICD-10-CM | POA: Diagnosis not present

## 2015-08-24 DIAGNOSIS — I5033 Acute on chronic diastolic (congestive) heart failure: Secondary | ICD-10-CM | POA: Diagnosis not present

## 2015-08-24 DIAGNOSIS — I517 Cardiomegaly: Secondary | ICD-10-CM | POA: Diagnosis not present

## 2015-08-24 DIAGNOSIS — Z9181 History of falling: Secondary | ICD-10-CM | POA: Diagnosis not present

## 2015-08-24 DIAGNOSIS — D631 Anemia in chronic kidney disease: Secondary | ICD-10-CM | POA: Diagnosis not present

## 2015-08-24 DIAGNOSIS — I160 Hypertensive urgency: Secondary | ICD-10-CM | POA: Diagnosis not present

## 2015-08-24 DIAGNOSIS — Z743 Need for continuous supervision: Secondary | ICD-10-CM | POA: Diagnosis not present

## 2015-08-24 DIAGNOSIS — I161 Hypertensive emergency: Secondary | ICD-10-CM | POA: Diagnosis not present

## 2015-08-24 DIAGNOSIS — R279 Unspecified lack of coordination: Secondary | ICD-10-CM | POA: Diagnosis not present

## 2015-08-24 DIAGNOSIS — D649 Anemia, unspecified: Secondary | ICD-10-CM | POA: Diagnosis not present

## 2015-08-24 DIAGNOSIS — Z8781 Personal history of (healed) traumatic fracture: Secondary | ICD-10-CM | POA: Diagnosis not present

## 2015-08-24 DIAGNOSIS — Z862 Personal history of diseases of the blood and blood-forming organs and certain disorders involving the immune mechanism: Secondary | ICD-10-CM | POA: Diagnosis not present

## 2015-08-24 DIAGNOSIS — J810 Acute pulmonary edema: Secondary | ICD-10-CM | POA: Diagnosis not present

## 2015-08-24 DIAGNOSIS — I509 Heart failure, unspecified: Secondary | ICD-10-CM | POA: Diagnosis not present

## 2015-08-24 DIAGNOSIS — E43 Unspecified severe protein-calorie malnutrition: Secondary | ICD-10-CM | POA: Diagnosis not present

## 2015-08-24 DIAGNOSIS — S72002D Fracture of unspecified part of neck of left femur, subsequent encounter for closed fracture with routine healing: Secondary | ICD-10-CM | POA: Diagnosis not present

## 2015-08-24 DIAGNOSIS — Z8542 Personal history of malignant neoplasm of other parts of uterus: Secondary | ICD-10-CM | POA: Diagnosis not present

## 2015-08-24 DIAGNOSIS — M109 Gout, unspecified: Secondary | ICD-10-CM | POA: Diagnosis not present

## 2015-08-24 DIAGNOSIS — I10 Essential (primary) hypertension: Secondary | ICD-10-CM | POA: Diagnosis not present

## 2015-08-24 DIAGNOSIS — J189 Pneumonia, unspecified organism: Secondary | ICD-10-CM | POA: Diagnosis not present

## 2015-08-24 DIAGNOSIS — Z7401 Bed confinement status: Secondary | ICD-10-CM | POA: Diagnosis not present

## 2015-08-24 DIAGNOSIS — J9601 Acute respiratory failure with hypoxia: Secondary | ICD-10-CM | POA: Diagnosis not present

## 2015-08-24 DIAGNOSIS — Z681 Body mass index (BMI) 19 or less, adult: Secondary | ICD-10-CM | POA: Diagnosis not present

## 2015-08-24 DIAGNOSIS — Z79899 Other long term (current) drug therapy: Secondary | ICD-10-CM | POA: Diagnosis not present

## 2015-08-24 DIAGNOSIS — H532 Diplopia: Secondary | ICD-10-CM | POA: Diagnosis not present

## 2015-08-24 DIAGNOSIS — J811 Chronic pulmonary edema: Secondary | ICD-10-CM | POA: Diagnosis not present

## 2015-08-24 DIAGNOSIS — Z8669 Personal history of other diseases of the nervous system and sense organs: Secondary | ICD-10-CM | POA: Diagnosis not present

## 2015-08-24 DIAGNOSIS — K5792 Diverticulitis of intestine, part unspecified, without perforation or abscess without bleeding: Secondary | ICD-10-CM | POA: Diagnosis not present

## 2015-08-24 DIAGNOSIS — Z1389 Encounter for screening for other disorder: Secondary | ICD-10-CM | POA: Diagnosis not present

## 2015-08-24 DIAGNOSIS — N2581 Secondary hyperparathyroidism of renal origin: Secondary | ICD-10-CM | POA: Diagnosis not present

## 2015-08-24 DIAGNOSIS — Q019 Encephalocele, unspecified: Secondary | ICD-10-CM | POA: Diagnosis not present

## 2015-08-24 DIAGNOSIS — R51 Headache: Secondary | ICD-10-CM | POA: Diagnosis not present

## 2015-08-24 DIAGNOSIS — M6281 Muscle weakness (generalized): Secondary | ICD-10-CM | POA: Diagnosis not present

## 2015-08-24 DIAGNOSIS — R42 Dizziness and giddiness: Secondary | ICD-10-CM | POA: Diagnosis not present

## 2015-08-24 DIAGNOSIS — K219 Gastro-esophageal reflux disease without esophagitis: Secondary | ICD-10-CM | POA: Diagnosis not present

## 2015-08-24 DIAGNOSIS — I120 Hypertensive chronic kidney disease with stage 5 chronic kidney disease or end stage renal disease: Secondary | ICD-10-CM | POA: Diagnosis not present

## 2015-08-24 DIAGNOSIS — N186 End stage renal disease: Secondary | ICD-10-CM | POA: Diagnosis not present

## 2015-08-24 DIAGNOSIS — Q059 Spina bifida, unspecified: Secondary | ICD-10-CM | POA: Diagnosis not present

## 2015-08-24 DIAGNOSIS — E039 Hypothyroidism, unspecified: Secondary | ICD-10-CM | POA: Diagnosis not present

## 2015-08-24 DIAGNOSIS — D509 Iron deficiency anemia, unspecified: Secondary | ICD-10-CM | POA: Diagnosis not present

## 2015-08-24 DIAGNOSIS — R030 Elevated blood-pressure reading, without diagnosis of hypertension: Secondary | ICD-10-CM | POA: Diagnosis not present

## 2015-08-24 DIAGNOSIS — R1110 Vomiting, unspecified: Secondary | ICD-10-CM | POA: Diagnosis not present

## 2015-08-24 DIAGNOSIS — R262 Difficulty in walking, not elsewhere classified: Secondary | ICD-10-CM | POA: Diagnosis not present

## 2015-08-24 DIAGNOSIS — R1310 Dysphagia, unspecified: Secondary | ICD-10-CM | POA: Diagnosis not present

## 2015-08-24 MED ORDER — DOXAZOSIN MESYLATE 2 MG PO TABS
8.0000 mg | ORAL_TABLET | Freq: Every day | ORAL | Status: DC
  Administered 2015-08-24: 8 mg via ORAL
  Filled 2015-08-24: qty 4

## 2015-08-24 MED ORDER — DOXAZOSIN MESYLATE 8 MG PO TABS: 8.0000 mg | ORAL_TABLET | Freq: Every day | ORAL | Status: DC

## 2015-08-24 NOTE — Discharge Summary (Signed)
Physician Discharge Summary  Sonya [name] [id number] DOB: 1951-05-02 DOA: 08/19/2015  PCP: [name], MD  Admit date: 08/19/2015 Discharge date: 08/24/2015  Recommendations for Outpatient Follow-up:  1. Patient will be discharged back to SNF.  2. Titration of medications for improved blood pressure control. Goal SBP 160-180 with further reduction over the next several weeks in consultation with physician.   Follow-up Information    Follow up with [facility] In 2 weeks.   Specialty:  [place] information:   [place] [employer]      Follow up with [name], MD In 2 weeks.   Specialty:  Internal Medicine   Contact information:   [address] [place] [id number] [phone]        Discharge Diagnoses:  1. Hypertensive urgency/Malignant hypertension  2. Pulmonary edema secondary to volume overload.  3. Suspected acute on chronic diastolic congestive heart failure in combination with volume overload and end-stage renal disease. 4. Community acquired pneumonia. 5. Acute hypoxic respiratory failure secondary to pneumonia.  6. End-stage renal disease.  7. Hypothyroidism.   Discharge Condition: Improved Disposition: SNF  Diet recommendation: Heart healthy  Filed Weights   08/23/15 0446 08/23/15 1145 08/24/15 0500  Weight: 73.9 kg (162 lb 14.7 oz) 71.85 kg (158 lb 6.4 oz) 69.899 kg (154 lb 1.6 oz)    History of present illness:  56 yof with a hx of ESRD on HD, HTN, and hypothyroidism presented with blurred vision and a cough. While in the ED, she was found to have severe hypertension. CXR revealed CHF as well as a possible right medial lung infiltrate. She was started on IV abx and admitted for further management.   Hospital Course:  Blurred vision rapidly resolved and blood pressure control while labile and somewhat improved. She has a long-standing  history of severe hypertension as documented in previous office notes. Pulmonary edema resolved with hemodialysis as it acute diastolic heart failure. Acute hypoxic respiratory result of treatment as did community acquired pneumonia. She completed a course of antibiotics during this hospitalization. Discussion with her nephrologist, plan to continue her current medications with the addition of doxazosin in follow-up in the outpatient setting for continued blood pressure reduction. She is currently asymptomatic.  Individual issues as below:  1. Acute pulmonary edema secondary to volume overload. Resolved clinically with hemodialysis. 2. Suspected acute on chronic diastolic congestive heart failure in combination with volume overload and end-stage renal disease. Appears resolved with hemodialysis. 3. Acute hypoxic respiratory failure secondary to pneumonia and volume overload, resolved. 4. Community acquired pneumonia, resolved clinically. 5. Malignant hypertension. Review of records demonstrates long-standing severe hypertension. Case discussed with the patient's nephrologist Dr. Lowanda Foster who has made adjustments to her antihypertensive regimen. Patient's asymptomatic and blood pressures remained somewhat labile.  6. End-stage renal disease. No indication for hemodialysis today per discussion with nephrology.  7. Hypothyroidism.  Consultants:  Nephrology  Procedures:  HD 1/19 with removal of 3L.   ECHO Study Conclusions  - Left ventricle: The cavity size was normal. Wall thickness was increased in a pattern of severe LVH. Systolic function was normal. The estimated ejection fraction was in the range of 60% to 65%. Wall motion was normal; there were no regional wall motion abnormalities. Features are consistent with a pseudonormal left ventricular filling pattern, with concomitant abnormal relaxation and increased filling pressure (grade 2 diastolic dysfunction). Doppler  parameters are consistent with high ventricular filling pressure. - Aortic  valve: Valve area (VTI): 2.18 cm^2. Valve area (Vmax): 2.41 cm^2. - Mitral valve: There was mild regurgitation. - Left atrium: The atrium was severely dilated. - Right atrium: The atrium was moderately to severely dilated. - Atrial septum: No defect or patent foramen ovale was identified. - Tricuspid valve: There was mild-moderate regurgitation. - Pulmonary arteries: Systolic pressure was moderately increased. PA peak pressure: 42 mm Hg (S). - Inferior vena cava: The vessel was dilated. The respirophasic diameter changes were blunted (< 50%), consistent with elevated central venous pressure. - Pericardium, extracardiac: There is a large left pleural effusion - Technically adequate study.  HD 1/21 with removal of 3.5L  Antibiotics:  Levaquin 1/17>>1/19  Vancomycin 1/17>>1/21  Discharge Instructions     Current Discharge Medication List    START taking these medications   Details  doxazosin (CARDURA) 8 MG tablet Take 1 tablet (8 mg total) by mouth daily. Qty: 30 tablet, Refills: 0      CONTINUE these medications which have NOT CHANGED   Details  alendronate (FOSAMAX) 70 MG tablet Take 70 mg by mouth once a week. Take with a full glass of water on an empty stomach.    allopurinol (ZYLOPRIM) 300 MG tablet Take 0.5 tablets (150 mg total) by mouth daily. Dose was decreased secondary to renal failure.    cloNIDine (CATAPRES - DOSED IN MG/24 HR) 0.3 mg/24hr patch Place 1 patch (0.3 mg total) onto the skin once a week.    hydrALAZINE (APRESOLINE) 100 MG tablet Take 1 tablet (100 mg total) by mouth every 6 (six) hours.    HYDROcodone-acetaminophen (NORCO) 7.5-325 MG tablet Take 1-2 tablets by mouth every 6 (six) hours as needed for moderate pain. Qty: 90 tablet, Refills: 0    labetalol (NORMODYNE) 200 MG tablet Take 600 mg by mouth 2 (two) times daily.    levothyroxine (SYNTHROID,  LEVOTHROID) 50 MCG tablet Take 1 tablet (50 mcg total) by mouth daily. Qty: 30 tablet, Refills: 0    lidocaine-prilocaine (EMLA) cream Apply 1 application topically every Tuesday, Thursday, and Saturday at 6 PM.     lisinopril (PRINIVIL,ZESTRIL) 40 MG tablet Take 1 tablet (40 mg total) by mouth daily.    LORazepam (ATIVAN) 0.5 MG tablet Take 1-2 tablets (0.5-1 mg total) by mouth every 6 (six) hours as needed for anxiety or sleep (START WITH 0.5 FIRST; WHEN NECESSARY FOR VERTIGO/DIZZINESS; also prn anxiety and sleep). Qty: 30 tablet, Refills: 0    meclizine (ANTIVERT) 25 MG tablet Take 1 tablet by mouth every 6 (six) hours as needed for dizziness. dizziness    metoCLOPramide (REGLAN) 5 MG tablet 1 po before breakfast and lunch and at bedtime. Qty: 90 tablet, Refills: 11    NIFEdipine (PROCARDIA XL/ADALAT-CC) 90 MG 24 hr tablet Take 90 mg by mouth daily.    omeprazole (PRILOSEC) 20 MG capsule Take 1 capsule (20 mg total) by mouth daily. Qty: 31 capsule, Refills: 5    senna-docusate (SENOKOT-S) 8.6-50 MG tablet Take 1 tablet by mouth 2 (two) times daily.     sevelamer carbonate (RENVELA) 800 MG tablet Take 2,400 mg by mouth 3 (three) times daily with meals.       STOP taking these medications     sodium polystyrene (KAYEXALATE) 15 GM/60ML suspension      sodium polystyrene (KAYEXALATE) powder        Allergies  Allergen Reactions  . Cephalexin Itching    The results of significant diagnostics from this hospitalization (including imaging, microbiology, ancillary and laboratory)  are listed below for reference.    Significant Diagnostic Studies: Dg Chest 2 View  08/19/2015  CLINICAL DATA:  Cough and congestion for 2-3 weeks. EXAM: CHEST  2 VIEW COMPARISON:  May 30, 2015 FINDINGS: No pneumothorax. Cardiomegaly persists. The hila and mediastinum are unchanged. Diffuse bilateral interstitial opacities are identified with more focal opacity in the medial right lung base. Pleural  fluid is also seen on the lateral view. No other acute abnormalities. IMPRESSION: 1. The diffuse bilateral interstitial opacities suggest pulmonary edema given cardiomegaly. There is more focal opacity in the medial right lung base which could represent more focal edema versus a superimposed infiltrate. Recommend follow-up to resolution. Pleural effusions are identified on the lateral view. Electronically Signed   By: Dorise Bullion III M.D   On: 08/19/2015 14:14   Labs: Basic Metabolic Panel:  Recent Labs Lab 08/19/15 1311 08/20/15 0158 08/22/15 0637 08/23/15 1215  NA 137 136 136 133*  K 3.3* 3.4* 3.8 4.1  CL 96* 98* 97* 96*  CO2 30 28 30 29   GLUCOSE 89 91 81 77  BUN 17 25* 17 30*  CREATININE 3.75* 4.47* 3.49* 4.99*  CALCIUM 8.7* 8.6* 8.8* 8.8*  PHOS  --   --  2.1* 2.3*   Liver Function Tests:  Recent Labs Lab 08/20/15 0158 08/23/15 1215  AST 15  --   ALT 8*  --   ALKPHOS 29*  --   BILITOT 0.7  --   PROT 6.0*  --   ALBUMIN 2.6* 2.7*    CBC:  Recent Labs Lab 08/19/15 1311 08/20/15 0158 08/22/15 0637 08/23/15 1215  WBC 3.4* 3.4* 3.0* 2.4*  NEUTROABS 2.1  --   --   --   HGB 10.7* 9.9* 10.2* 10.0*  HCT 32.9* 31.1* 33.0* 31.4*  MCV 77.6* 77.0* 76.7* 75.1*  PLT 164 121* 185 170   Cardiac Enzymes:  Recent Labs Lab 08/19/15 1311 08/19/15 1948 08/20/15 0158 08/20/15 0644  TROPONINI <0.03 <0.03 <0.03 <0.03       Recent Labs  10/14/14 1608 08/19/15 1311  BNP 1512.0* 3538.0*     CBG:  Recent Labs Lab 08/19/15 2007 08/20/15 1652  GLUCAP 100* 113*    Principal Problem:   Pulmonary edema Active Problems:   Essential hypertension   Hypertensive urgency   End stage renal disease ([place])   Hypertensive emergency   Protein-calorie malnutrition, severe ([place])   Uncontrolled hypertension   Hypothyroidism, adult   CAP (community acquired pneumonia)   Time coordinating discharge: 35 minutes  Signed:  Murray Hodgkins, MD [facility] 08/24/2015, 8:45 AM  By signing my name below, I, Rosalie Doctor attest that this documentation has been prepared under the direction and in the presence of Murray Hodgkins, MD Electronically signed: Rosalie Doctor, Scribe.  08/24/2015   I personally performed the services described in this documentation. All medical record entries made by the scribe were at my direction. I have reviewed the chart and agree that the record reflects my personal performance and is accurate and complete. Murray Hodgkins, MD

## 2015-08-24 NOTE — Progress Notes (Signed)
Notified Debbie at [facility] of pt discharging back to facility today.  Called report to [facility] [place].  Pt to be transported via EMS with belongings to facility.  Husband is aware, at bedside.

## 2015-08-24 NOTE — Progress Notes (Signed)
CSW has sent d/c paperwork to [facility] via [employer].  CSW discussed pt's d/c with Hulan Amato.  Unit will touch base with Debbie at [facility] re: d/c.  Creta Levin, LCSW Weekend Coverage [id number]

## 2015-08-24 NOTE — Progress Notes (Signed)
PROGRESS NOTE  Sonya [name] [id number] DOB: [birth year] DOA: 08/19/2015 PCP: [name], MD  Summary: 65 yof with a hx of ESRD on HD, HTN, and hypothyroidism presented with blurred vision and a cough. While in the ED, she was found to have severe hypertension. CXR revealed CHF as well as a possible right medial lung infiltrate. She was started on IV abx and admitted for further management.   Assessment/Plan: 1. Acute pulmonary edema secondary to volume overload. Resolved clinically with hemodialysis. 2. Suspected acute on chronic diastolic congestive heart failure in combination with volume overload and end-stage renal disease. Appears resolved with hemodialysis. 3. Acute hypoxic respiratory failure secondary to pneumonia and volume overload, resolved. 4. Community acquired pneumonia, resolved clinically. 5. Malignant hypertension. Review of records demonstrates long-standing severe hypertension. Case discussed with the patient's nephrologist Dr. Lowanda Foster who has made adjustments to her antihypertensive regimen. Patient's asymptomatic and blood pressures remained somewhat labile.  6. End-stage renal disease. No indication for hemodialysis today per discussion with nephrology.  7. Hypothyroidism.   Appears stable. Alert, no hypoxia. Pneumonia and acute pulmonary issues appear resolved. Her blood pressure remains labile with wide swings. She has long-standing hypertension and a discussion with nephrology, dose adjustments have been made and will plan for close outpatient follow-up. Do not see any benefit to further hospitalization at this point   Code Status: Full DVT prophylaxis: Heparin  Family Communication: Husband at bedside.  Disposition Plan: Discharge back to SNF today.   Murray Hodgkins, MD  [facility]  Pager [phone] If 7PM-7AM, please contact night-coverage at [email or web address], password [email or web address] 08/24/2015, 6:42 AM  LOS: 5 days    Consultants:  Nephrology  Procedures:  HD 1/19 with removal of 3L.   ECHO Study Conclusions  - Left ventricle: The cavity size was normal. Wall thickness was increased in a pattern of severe LVH. Systolic function was normal. The estimated ejection fraction was in the range of 60% to 65%. Wall motion was normal; there were no regional wall motion abnormalities. Features are consistent with a pseudonormal left ventricular filling pattern, with concomitant abnormal relaxation and increased filling pressure (grade 2 diastolic dysfunction). Doppler parameters are consistent with high ventricular filling pressure. - Aortic valve: Valve area (VTI): 2.18 cm^2. Valve area (Vmax): 2.41 cm^2. - Mitral valve: There was mild regurgitation. - Left atrium: The atrium was severely dilated. - Right atrium: The atrium was moderately to severely dilated. - Atrial septum: No defect or patent foramen ovale was identified. - Tricuspid valve: There was mild-moderate regurgitation. - Pulmonary arteries: Systolic pressure was moderately increased. PA peak pressure: 42 mm Hg (S). - Inferior vena cava: The vessel was dilated. The respirophasic diameter changes were blunted (< 50%), consistent with elevated central venous pressure. - Pericardium, extracardiac: There is a large left pleural effusion - Technically adequate study.  HD 1/21 with removal of 3.5L  Antibiotics:  Levaquin 1/17>> 1/23  Vancomycin 1/17>>1/21  HPI/Subjective: Feels good, ready to leave. SOB and cough are improved. Denies any pain.    Objective: Filed Vitals:   08/23/15 1554 08/23/15 2120 08/24/15 0100 08/24/15 0500  BP: 226/103 140/55 167/68 192/100  Pulse: 65 67 67 72  Temp:  98.9 F (37.2 C)  98.7 F (37.1 C)  TempSrc:  Oral  Oral  Resp:  18    Height:      Weight:    69.899 kg (154 lb 1.6 oz)  SpO2:  98%  98%    Intake/Output Summary (Last 24 hours) at 08/24/15 [phone]  Last data  filed at 08/23/15 1816  Gross per 24 hour  Intake    360 ml  Output   3700 ml  Net  -3340 ml     Filed Weights   08/23/15 0446 08/23/15 1145 08/24/15 0500  Weight: 73.9 kg (162 lb 14.7 oz) 71.85 kg (158 lb 6.4 oz) 69.899 kg (154 lb 1.6 oz)    Exam:     Afebrile, hypertensive  General:  Appears comfortable, calm. Sitting in chair.  Cardiovascular: Regular rate and rhythm, no murmur, rub or gallop. No lower extremity edema. Respiratory: Clear to auscultation bilaterally, no wheezes, rales or rhonchi. Normal respiratory effort. Abdomen: soft, ntnd Psychiatric: grossly normal mood and affect, speech fluent and appropriate Neurologic: grossly non-focal.   New data reviewed:  No new data  Pertinent data since admission:  Potassium 3.3  Creatinine 3.75  BNP 3538   Scheduled Meds: . cloNIDine  0.3 mg Transdermal Weekly  . doxazosin  4 mg Oral Daily  . epoetin (EPOGEN/PROCRIT) injection  3,000 Units Subcutaneous Q T,Th,Sa-HD  . feeding supplement (NEPRO CARB STEADY)  237 mL Oral BID BM  . heparin  5,000 Units Subcutaneous 3 times per day  . hydrALAZINE  100 mg Oral 4 times per day  . labetalol  600 mg Oral BID  . levofloxacin (LEVAQUIN) IV  500 mg Intravenous Q48H  . levothyroxine  50 mcg Oral QAC breakfast  . lisinopril  40 mg Oral Daily  . NIFEdipine  90 mg Oral Daily  . pantoprazole  40 mg Oral Daily  . senna-docusate  1 tablet Oral BID  . sodium chloride  3 mL Intravenous Q12H   Continuous Infusions:   Principal Problem:   Pulmonary edema Active Problems:   Essential hypertension   Hypertensive urgency   End stage renal disease ([facility])   Hypertensive emergency   Protein-calorie malnutrition, severe ([place])   Uncontrolled hypertension   Hypothyroidism, adult   CAP (community acquired pneumonia)    By signing my name below, I, Rosalie Doctor attest that this documentation has been prepared under the direction and in the presence of Murray Hodgkins,  MD Electronically signed: Rosalie Doctor, Scribe. 08/24/2015   11:45am  I personally performed the services described in this documentation. All medical record entries made by the scribe were at my direction. I have reviewed the chart and agree that the record reflects my personal performance and is accurate and complete. Murray Hodgkins, MD

## 2015-08-24 NOTE — Progress Notes (Signed)
Subjective: Patient still complains of dry cough mainly at night when she lied down. She denies any difficulty breathing. No fever chills or sweating.  Objective: Vital signs in last 24 hours: Temp:  [97.9 F (36.6 C)-98.9 F (37.2 C)] 98.7 F (37.1 C) (01/22 0500) Pulse Rate:  [63-72] 72 (01/22 0500) Resp:  [16-18] 18 (01/21 2120) BP: (140-246)/(55-107) 192/100 mmHg (01/22 0500) SpO2:  [97 %-98 %] 98 % (01/22 0500) Weight:  [154 lb 1.6 oz (69.899 kg)-158 lb 6.4 oz (71.85 kg)] 154 lb 1.6 oz (69.899 kg) (01/22 0500)  Intake/Output from previous day: 01/21 0701 - 01/22 0700 In: 360 [P.O.:360] Out: 3700 [Urine:100] Intake/Output this shift:     Recent Labs  08/22/15 0637 08/23/15 1215  HGB 10.2* 10.0*    Recent Labs  08/22/15 0637 08/23/15 1215  WBC 3.0* 2.4*  RBC 4.30 4.18  HCT 33.0* 31.4*  PLT 185 170    Recent Labs  08/22/15 0637 08/23/15 1215  NA 136 133*  K 3.8 4.1  CL 97* 96*  CO2 30 29  BUN 17 30*  CREATININE 3.49* 4.99*  GLUCOSE 81 77  CALCIUM 8.8* 8.8*   No results for input(s): LABPT, INR in the last 72 hours.  Generally patient is alert in no apparent distress Chest is clear to auscultation. Heart exam revealed regular rate and rhythm Extremities no edema  Assessment/Plan: Problem #1 end-stage renal disease: She is status post hemodialysis yesterday and she is asymptomatic. Problem #2 uncontrolled hypertension: Her blood pressure is still fluctuating. Presently high. Problem #3 anemia: Her hemoglobin is below our target goal. She is on Epogen Problem #4 cough: Is improving. Problem #5 hypothyroidism: On Synthroid. Problem #6 metabolic bone disease: Her calcium is in range however phosphorus is low. Presently she is off binder. Plan: 1] patient does require dialysis today. 2] we'll increase Cardura to 8 mg by mouth daily at bedtime.Marland Kitchen    Krisinda Giovanni S 08/24/2015, 8:57 AM

## 2015-08-26 DIAGNOSIS — N186 End stage renal disease: Secondary | ICD-10-CM | POA: Diagnosis not present

## 2015-08-26 DIAGNOSIS — D509 Iron deficiency anemia, unspecified: Secondary | ICD-10-CM | POA: Diagnosis not present

## 2015-08-26 DIAGNOSIS — Z992 Dependence on renal dialysis: Secondary | ICD-10-CM | POA: Diagnosis not present

## 2015-08-26 DIAGNOSIS — D631 Anemia in chronic kidney disease: Secondary | ICD-10-CM | POA: Diagnosis not present

## 2015-08-26 DIAGNOSIS — N2581 Secondary hyperparathyroidism of renal origin: Secondary | ICD-10-CM | POA: Diagnosis not present

## 2015-08-27 DIAGNOSIS — J9601 Acute respiratory failure with hypoxia: Secondary | ICD-10-CM | POA: Diagnosis not present

## 2015-08-27 DIAGNOSIS — N186 End stage renal disease: Secondary | ICD-10-CM | POA: Diagnosis not present

## 2015-08-27 DIAGNOSIS — E039 Hypothyroidism, unspecified: Secondary | ICD-10-CM | POA: Diagnosis not present

## 2015-08-27 DIAGNOSIS — J189 Pneumonia, unspecified organism: Secondary | ICD-10-CM | POA: Diagnosis not present

## 2015-08-27 DIAGNOSIS — I161 Hypertensive emergency: Secondary | ICD-10-CM | POA: Diagnosis not present

## 2015-08-28 DIAGNOSIS — D509 Iron deficiency anemia, unspecified: Secondary | ICD-10-CM | POA: Diagnosis not present

## 2015-08-28 DIAGNOSIS — Z992 Dependence on renal dialysis: Secondary | ICD-10-CM | POA: Diagnosis not present

## 2015-08-28 DIAGNOSIS — I509 Heart failure, unspecified: Secondary | ICD-10-CM | POA: Diagnosis not present

## 2015-08-28 DIAGNOSIS — D631 Anemia in chronic kidney disease: Secondary | ICD-10-CM | POA: Diagnosis not present

## 2015-08-28 DIAGNOSIS — N186 End stage renal disease: Secondary | ICD-10-CM | POA: Diagnosis not present

## 2015-08-28 DIAGNOSIS — Z1389 Encounter for screening for other disorder: Secondary | ICD-10-CM | POA: Diagnosis not present

## 2015-08-28 DIAGNOSIS — N2581 Secondary hyperparathyroidism of renal origin: Secondary | ICD-10-CM | POA: Diagnosis not present

## 2015-08-28 DIAGNOSIS — Z681 Body mass index (BMI) 19 or less, adult: Secondary | ICD-10-CM | POA: Diagnosis not present

## 2015-08-30 DIAGNOSIS — N186 End stage renal disease: Secondary | ICD-10-CM | POA: Diagnosis not present

## 2015-08-30 DIAGNOSIS — D631 Anemia in chronic kidney disease: Secondary | ICD-10-CM | POA: Diagnosis not present

## 2015-08-30 DIAGNOSIS — Z992 Dependence on renal dialysis: Secondary | ICD-10-CM | POA: Diagnosis not present

## 2015-08-30 DIAGNOSIS — D509 Iron deficiency anemia, unspecified: Secondary | ICD-10-CM | POA: Diagnosis not present

## 2015-08-30 DIAGNOSIS — N2581 Secondary hyperparathyroidism of renal origin: Secondary | ICD-10-CM | POA: Diagnosis not present

## 2015-09-02 DIAGNOSIS — N2581 Secondary hyperparathyroidism of renal origin: Secondary | ICD-10-CM | POA: Diagnosis not present

## 2015-09-02 DIAGNOSIS — D631 Anemia in chronic kidney disease: Secondary | ICD-10-CM | POA: Diagnosis not present

## 2015-09-02 DIAGNOSIS — Z992 Dependence on renal dialysis: Secondary | ICD-10-CM | POA: Diagnosis not present

## 2015-09-02 DIAGNOSIS — N186 End stage renal disease: Secondary | ICD-10-CM | POA: Diagnosis not present

## 2015-09-02 DIAGNOSIS — D509 Iron deficiency anemia, unspecified: Secondary | ICD-10-CM | POA: Diagnosis not present

## 2015-09-04 DIAGNOSIS — N2581 Secondary hyperparathyroidism of renal origin: Secondary | ICD-10-CM | POA: Diagnosis not present

## 2015-09-04 DIAGNOSIS — Z992 Dependence on renal dialysis: Secondary | ICD-10-CM | POA: Diagnosis not present

## 2015-09-04 DIAGNOSIS — D509 Iron deficiency anemia, unspecified: Secondary | ICD-10-CM | POA: Diagnosis not present

## 2015-09-04 DIAGNOSIS — D631 Anemia in chronic kidney disease: Secondary | ICD-10-CM | POA: Diagnosis not present

## 2015-09-04 DIAGNOSIS — N186 End stage renal disease: Secondary | ICD-10-CM | POA: Diagnosis not present

## 2015-09-06 DIAGNOSIS — N186 End stage renal disease: Secondary | ICD-10-CM | POA: Diagnosis not present

## 2015-09-06 DIAGNOSIS — N2581 Secondary hyperparathyroidism of renal origin: Secondary | ICD-10-CM | POA: Diagnosis not present

## 2015-09-06 DIAGNOSIS — D631 Anemia in chronic kidney disease: Secondary | ICD-10-CM | POA: Diagnosis not present

## 2015-09-06 DIAGNOSIS — D509 Iron deficiency anemia, unspecified: Secondary | ICD-10-CM | POA: Diagnosis not present

## 2015-09-06 DIAGNOSIS — Z992 Dependence on renal dialysis: Secondary | ICD-10-CM | POA: Diagnosis not present

## 2015-09-09 DIAGNOSIS — D631 Anemia in chronic kidney disease: Secondary | ICD-10-CM | POA: Diagnosis not present

## 2015-09-09 DIAGNOSIS — Z992 Dependence on renal dialysis: Secondary | ICD-10-CM | POA: Diagnosis not present

## 2015-09-09 DIAGNOSIS — N2581 Secondary hyperparathyroidism of renal origin: Secondary | ICD-10-CM | POA: Diagnosis not present

## 2015-09-09 DIAGNOSIS — N186 End stage renal disease: Secondary | ICD-10-CM | POA: Diagnosis not present

## 2015-09-09 DIAGNOSIS — D509 Iron deficiency anemia, unspecified: Secondary | ICD-10-CM | POA: Diagnosis not present

## 2015-09-11 ENCOUNTER — Emergency Department (HOSPITAL_COMMUNITY)
Admission: EM | Admit: 2015-09-11 | Discharge: 2015-09-11 | Disposition: A | Discharge status: Home / Self Care | Payer: Medicare Other | Attending: Emergency Medicine | Admitting: Emergency Medicine

## 2015-09-11 ENCOUNTER — Emergency Department (HOSPITAL_COMMUNITY): Payer: Medicare Other

## 2015-09-11 ENCOUNTER — Encounter (HOSPITAL_COMMUNITY): Payer: Self-pay | Admitting: Cardiology

## 2015-09-11 DIAGNOSIS — Z8542 Personal history of malignant neoplasm of other parts of uterus: Secondary | ICD-10-CM | POA: Insufficient documentation

## 2015-09-11 DIAGNOSIS — I120 Hypertensive chronic kidney disease with stage 5 chronic kidney disease or end stage renal disease: Secondary | ICD-10-CM | POA: Diagnosis not present

## 2015-09-11 DIAGNOSIS — N2581 Secondary hyperparathyroidism of renal origin: Secondary | ICD-10-CM | POA: Insufficient documentation

## 2015-09-11 DIAGNOSIS — I10 Essential (primary) hypertension: Secondary | ICD-10-CM | POA: Diagnosis not present

## 2015-09-11 DIAGNOSIS — K219 Gastro-esophageal reflux disease without esophagitis: Secondary | ICD-10-CM | POA: Insufficient documentation

## 2015-09-11 DIAGNOSIS — Z79899 Other long term (current) drug therapy: Secondary | ICD-10-CM | POA: Insufficient documentation

## 2015-09-11 DIAGNOSIS — N186 End stage renal disease: Secondary | ICD-10-CM | POA: Diagnosis not present

## 2015-09-11 DIAGNOSIS — D509 Iron deficiency anemia, unspecified: Secondary | ICD-10-CM | POA: Diagnosis not present

## 2015-09-11 DIAGNOSIS — Q059 Spina bifida, unspecified: Secondary | ICD-10-CM | POA: Diagnosis not present

## 2015-09-11 DIAGNOSIS — Z862 Personal history of diseases of the blood and blood-forming organs and certain disorders involving the immune mechanism: Secondary | ICD-10-CM | POA: Insufficient documentation

## 2015-09-11 DIAGNOSIS — I517 Cardiomegaly: Secondary | ICD-10-CM | POA: Diagnosis not present

## 2015-09-11 DIAGNOSIS — E039 Hypothyroidism, unspecified: Secondary | ICD-10-CM | POA: Diagnosis not present

## 2015-09-11 DIAGNOSIS — D631 Anemia in chronic kidney disease: Secondary | ICD-10-CM | POA: Diagnosis not present

## 2015-09-11 DIAGNOSIS — Z8781 Personal history of (healed) traumatic fracture: Secondary | ICD-10-CM | POA: Insufficient documentation

## 2015-09-11 DIAGNOSIS — Z992 Dependence on renal dialysis: Secondary | ICD-10-CM | POA: Diagnosis not present

## 2015-09-11 DIAGNOSIS — Z8669 Personal history of other diseases of the nervous system and sense organs: Secondary | ICD-10-CM | POA: Insufficient documentation

## 2015-09-11 LAB — CBC WITH DIFFERENTIAL/PLATELET
BASOS ABS: 0 10*3/uL (ref 0.0–0.1)
Basophils Relative: 1 %
EOS PCT: 2 %
Eosinophils Absolute: 0.1 10*3/uL (ref 0.0–0.7)
HCT: 34.1 % — ABNORMAL LOW (ref 36.0–46.0)
HEMOGLOBIN: 10.5 g/dL — AB (ref 12.0–15.0)
LYMPHS PCT: 35 %
Lymphs Abs: 1.2 10*3/uL (ref 0.7–4.0)
MCH: 23.8 pg — ABNORMAL LOW (ref 26.0–34.0)
MCHC: 30.8 g/dL (ref 30.0–36.0)
MCV: 77.3 fL — AB (ref 78.0–100.0)
Monocytes Absolute: 0.7 10*3/uL (ref 0.1–1.0)
Monocytes Relative: 21 %
NEUTROS PCT: 41 %
Neutro Abs: 1.4 10*3/uL — ABNORMAL LOW (ref 1.7–7.7)
PLATELETS: 158 10*3/uL (ref 150–400)
RBC: 4.41 MIL/uL (ref 3.87–5.11)
RDW: 19.8 % — ABNORMAL HIGH (ref 11.5–15.5)
WBC: 3.3 10*3/uL — AB (ref 4.0–10.5)

## 2015-09-11 LAB — BASIC METABOLIC PANEL
Anion gap: 9 (ref 5–15)
BUN: 19 mg/dL (ref 6–20)
CHLORIDE: 99 mmol/L — AB (ref 101–111)
CO2: 30 mmol/L (ref 22–32)
Calcium: 8.9 mg/dL (ref 8.9–10.3)
Creatinine, Ser: 2.99 mg/dL — ABNORMAL HIGH (ref 0.44–1.00)
GFR calc Af Amer: 18 mL/min — ABNORMAL LOW (ref >60)
GFR, EST NON AFRICAN AMERICAN: 15 mL/min — AB (ref >60)
Glucose, Bld: 89 mg/dL (ref 65–99)
Potassium: 3.8 mmol/L (ref 3.5–5.1)
Sodium: 138 mmol/L (ref 135–145)

## 2015-09-11 MED ORDER — HYDRALAZINE HCL 20 MG/ML IJ SOLN
20.0000 mg | Freq: Once | INTRAMUSCULAR | Status: AC
  Administered 2015-09-11: 20 mg via INTRAVENOUS
  Filled 2015-09-11: qty 1

## 2015-09-11 MED ORDER — HYDRALAZINE HCL 25 MG PO TABS
100.0000 mg | ORAL_TABLET | Freq: Once | ORAL | Status: AC
  Administered 2015-09-11: 100 mg via ORAL
  Filled 2015-09-11: qty 4

## 2015-09-11 NOTE — ED Notes (Addendum)
Pt hypertensive at dialysis this morning.  Stopped dialysis early.  Per [facility]  only gave 1 of her regular b/p pills.  B/p 222/94 with ems.  240/100 at dialysis.   Pt states she feels fine.

## 2015-09-11 NOTE — Discharge Instructions (Signed)
Continue your blood pressure medicine as prescribed. Careful not to delay, or miss dosages. Recheck with your primary care physician and or nephrologist if episodes of hypertension continue to occur.   Hypertension Hypertension, commonly called high blood pressure, is when the force of blood pumping through your arteries is too strong. Your arteries are the blood vessels that carry blood from your heart throughout your body. A blood pressure reading consists of a higher number over a lower number, such as 110/72. The higher number (systolic) is the pressure inside your arteries when your heart pumps. The lower number (diastolic) is the pressure inside your arteries when your heart relaxes. Ideally you want your blood pressure below 120/80. Hypertension forces your heart to work harder to pump blood. Your arteries may become narrow or stiff. Having untreated or uncontrolled hypertension can cause heart attack, stroke, kidney disease, and other problems. RISK FACTORS Some risk factors for high blood pressure are controllable. Others are not.  Risk factors you cannot control include:   Race. You may be at higher risk if you are African American.  Age. Risk increases with age.  Gender. Men are at higher risk than women before age 69 years. After age 46, women are at higher risk than men. Risk factors you can control include:  Not getting enough exercise or physical activity.  Being overweight.  Getting too much fat, sugar, calories, or salt in your diet.  Drinking too much alcohol. SIGNS AND SYMPTOMS Hypertension does not usually cause signs or symptoms. Extremely high blood pressure (hypertensive crisis) may cause headache, anxiety, shortness of breath, and nosebleed. DIAGNOSIS To check if you have hypertension, your health care provider will measure your blood pressure while you are seated, with your arm held at the level of your heart. It should be measured at least twice using the same  arm. Certain conditions can cause a difference in blood pressure between your right and left arms. A blood pressure reading that is higher than normal on one occasion does not mean that you need treatment. If it is not clear whether you have high blood pressure, you may be asked to return on a different day to have your blood pressure checked again. Or, you may be asked to monitor your blood pressure at home for 1 or more weeks. TREATMENT Treating high blood pressure includes making lifestyle changes and possibly taking medicine. Living a healthy lifestyle can help lower high blood pressure. You may need to change some of your habits. Lifestyle changes may include:  Following the DASH diet. This diet is high in fruits, vegetables, and whole grains. It is low in salt, red meat, and added sugars.  Keep your sodium intake below 2,300 mg per day.  Getting at least 30-45 minutes of aerobic exercise at least 4 times per week.  Losing weight if necessary.  Not smoking.  Limiting alcoholic beverages.  Learning ways to reduce stress. Your health care provider may prescribe medicine if lifestyle changes are not enough to get your blood pressure under control, and if one of the following is true:  You are 59-61 years of age and your systolic blood pressure is above 140.  You are 21 years of age or older, and your systolic blood pressure is above 150.  Your diastolic blood pressure is above 90.  You have diabetes, and your systolic blood pressure is over [id number] or your diastolic blood pressure is over 90.  You have kidney disease and your blood pressure is above 140/90.  You have heart disease and your blood pressure is above 140/90. Your personal target blood pressure may vary depending on your medical conditions, your age, and other factors. HOME CARE INSTRUCTIONS  Have your blood pressure rechecked as directed by your health care provider.   Take medicines only as directed by your health  care provider. Follow the directions carefully. Blood pressure medicines must be taken as prescribed. The medicine does not work as well when you skip doses. Skipping doses also puts you at risk for problems.  Do not smoke.   Monitor your blood pressure at home as directed by your health care provider. SEEK MEDICAL CARE IF:   You think you are having a reaction to medicines taken.  You have recurrent headaches or feel dizzy.  You have swelling in your ankles.  You have trouble with your vision. SEEK IMMEDIATE MEDICAL CARE IF:  You develop a severe headache or confusion.  You have unusual weakness, numbness, or feel faint.  You have severe chest or abdominal pain.  You vomit repeatedly.  You have trouble breathing. MAKE SURE YOU:   Understand these instructions.  Will watch your condition.  Will get help right away if you are not doing well or get worse.   This information is not intended to replace advice given to you by your health care provider. Make sure you discuss any questions you have with your health care provider.   Document Released: 07/19/2005 Document Revised: 12/03/2014 Document Reviewed: 05/11/2013 Elsevier Interactive Patient Education [employer].

## 2015-09-11 NOTE — ED Provider Notes (Signed)
CSN: [id number]     Arrival date & time 09/11/15  1205 History  By signing my name below, I, Hansel Feinstein, attest that this documentation has been prepared under the direction and in the presence of Tanna Furry, MD. Electronically Signed: Hansel Feinstein, ED Scribe. 09/11/2015. 12:28 PM.    Chief Complaint  Patient presents with  . Hypertension   The history is provided by the patient. No language interpreter was used.    HPI Comments: Sonya [name] is a 65 y.o. female with h/o HTN, CKD on dialysis who presents to the Emergency Department due to HTN onset this morning while at dialysis. Pt states she started dialysis at 7am this morning and was told that her BP was elevated. She states she finished her dialysis treatment today and her treatments normally last 3.5 hours.  Pt reports she currently feels well. She notes she took her BP medication this morning and is otherwise compliant. She denies SOB, HA, CP.   Past Medical History  Diagnosis Date  . HTN (hypertension)   . Hypothyroidism   . Uterine cancer ([place]) 2012  . Anemia   . CKD (chronic kidney disease)   . Dysphagia   . Hyperparathyroidism due to renal insufficiency ([place])   . GERD (gastroesophageal reflux disease)   . Diverticulitis   . Vertical diplopia   . Vertigo   . Hypertensive urgency 06/2013 & 04/2015  . Fracture of femoral neck, left, closed 05/30/2015  . Gastritis 06/2013 & 05/2015  . Gastroesophageal reflux disease with stricture 05/2015  . ESRD (end stage renal disease) ([place])     On HD  . ICH (intracerebral hemorrhage) ([place]) 10/2014  . 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 Protein calorie malnutrition [facility]    Past Surgical History  Procedure Laterality Date  . Cholecystectomy    . Cesarean section    . Laparoscopic total hysterectomy    . Colonoscopy  2000    TICS, IH  . Colonoscopy  2003 NUR BRBPR D50 V6    DC/[place] TICS, IH  . Abdominal hysterectomy    . Nasal septum surgery    . Esophagogastroduodenoscopy (egd) with esophageal  dilation N/A 06/08/2013    Dr. Fields:Stricture at the gastroesophageal junction/MILD NON-erosive gastritis (inflammation) was found in the gastric antrum; multiple biopsies/NO SOURCE FOR ANEMIA IDETIFIED-MOST LIKELY DUE TO ANEMIA OF CHRONIC DISEASE  . Colonoscopy N/A 09/04/2013    Procedure: COLONOSCOPY;  Surgeon: Danie Binder, MD;  [facility];  Service: Endoscopy;  Laterality: N/A;  10:30AM-moved to 9:25 [name]  . Appendectomy    . Bascilic vein transposition Right 09/17/2013    Procedure: BRACHIAL VEIN TRANSPOSITION;  Surgeon: Conrad Luce, MD;  Location: [place];  Service: Vascular;  Laterality: Right;  . Insertion of dialysis catheter Left 09/17/2013    Procedure: INSERTION OF DIALYSIS CATHETER;  Surgeon: Conrad [place], MD;  Location: [place];  Service: Vascular;  Laterality: Left;  . Bascilic vein transposition Right 10/29/2013    Procedure: RIGHT SECOND STAGE BRACHIAL VEIN TRANSPOSITION;  Surgeon: Conrad Blanchester, MD;  Location: [facility] OR;  Service: Vascular;  Laterality: Right;  . Givens capsule study N/A 12/03/2013    Procedure: GIVENS CAPSULE STUDY;  Surgeon: Danie Binder, MD;  [facility];  Service: Endoscopy;  Laterality: N/A;  7:30  . Esophagogastroduodenoscopy N/A 05/23/2015    Procedure: ESOPHAGOGASTRODUODENOSCOPY (EGD);  Surgeon: Danie Binder, MD;  [facility];  Service: Endoscopy;  Laterality: N/A;  [place] dilation N/A 05/23/2015    Procedure: SAVORY DILATION;  Surgeon: Danie Binder, MD;  [facility];  Service: Endoscopy;  Laterality: N/A;  . Hip pinning,cannulated Left 05/31/2015    Procedure: CANNULATED HIP PINNING;  Surgeon: Leandrew Koyanagi, MD;  Location: ;  Service: Orthopedics;  Laterality: Left;   Family History  Problem Relation Age of Onset  . Colon cancer Neg Hx   . Cancer Mother   . Diabetes Mother   . Hypertension Mother   . Diabetes Father   . Hypertension Father   . Hypertension Sister     Social History  Substance Use Topics  . Smoking status: Never Smoker   . Smokeless tobacco: Never Used  . Alcohol Use: No   OB History    Gravida Para Term Preterm AB TAB SAB Ectopic Multiple Living   1 1 1             Review of Systems  Constitutional: Negative for fever, chills, diaphoresis, appetite change and fatigue.  HENT: Negative for mouth sores, sore throat and trouble swallowing.   Eyes: Negative for visual disturbance.  Respiratory: Negative for cough, chest tightness, shortness of breath and wheezing.   Cardiovascular: Negative for chest pain.  Gastrointestinal: Negative for nausea, vomiting, abdominal pain, diarrhea and abdominal distention.  Endocrine: Negative for polydipsia, polyphagia and polyuria.  Genitourinary: Negative for dysuria, frequency and hematuria.  Musculoskeletal: Negative for gait problem.  Skin: Negative for color change, pallor and rash.  Neurological: Negative for dizziness, syncope, light-headedness and headaches.  Hematological: Does not bruise/bleed easily.  Psychiatric/Behavioral: Negative for behavioral problems and confusion.   Allergies  Cephalexin  Home Medications   Prior to Admission medications   Medication Sig Start Date End Date Taking? Authorizing Provider  alendronate (FOSAMAX) 70 MG tablet Take 70 mg by mouth once a week. Take with a full glass of water on an empty stomach.    Historical Provider, MD  allopurinol (ZYLOPRIM) 300 MG tablet Take 0.5 tablets (150 mg total) by mouth daily. Dose was decreased secondary to renal failure. 06/16/15   Rexene Alberts, MD  cloNIDine (CATAPRES - DOSED IN MG/24 HR) 0.3 mg/24hr patch Place 1 patch (0.3 mg total) onto the skin once a week. 06/16/15   Rexene Alberts, MD  doxazosin (CARDURA) 8 MG tablet Take 1 tablet (8 mg total) by mouth daily. 08/24/15   Samuella Cota, MD  hydrALAZINE (APRESOLINE) 100 MG tablet Take 1 tablet (100 mg total) by mouth every 6 (six) hours. 06/04/15   Thurnell Lose, MD  HYDROcodone-acetaminophen (NORCO) 7.5-325 MG tablet Take 1-2 tablets by mouth every 6 (six) hours as needed for moderate pain. Patient taking differently: Take 1 tablet by mouth every 6 (six) hours as needed for moderate pain.  05/31/15   Leandrew Koyanagi, MD  labetalol (NORMODYNE) 200 MG tablet Take 600 mg by mouth 2 (two) times daily.    Historical Provider, MD  levothyroxine (SYNTHROID, LEVOTHROID) 50 MCG tablet Take 1 tablet (50 mcg total) by mouth daily. 04/17/15   Samuella Cota, MD  lidocaine-prilocaine (EMLA) cream Apply 1 application topically every Tuesday, Thursday, and Saturday at 6 PM.     Historical Provider, MD  lisinopril (PRINIVIL,ZESTRIL) 40 MG tablet Take 1 tablet (40 mg total) by mouth daily. 06/16/15   Rexene Alberts, MD  LORazepam (ATIVAN) 0.5 MG tablet Take 1-2 tablets (0.5-1 mg total) by mouth every 6 (six) hours as needed for anxiety or  sleep (START WITH 0.5 FIRST; WHEN NECESSARY FOR VERTIGO/DIZZINESS; also prn anxiety and sleep). Patient taking differently: Take 0.5 mg by mouth 3 (three) times daily.  06/16/15   Rexene Alberts, MD  meclizine (ANTIVERT) 25 MG tablet Take 1 tablet by mouth every 6 (six) hours as needed for dizziness. dizziness 11/22/14   Historical Provider, MD  metoCLOPramide (REGLAN) 5 MG tablet 1 po before breakfast and lunch and at bedtime. Patient taking differently: Take 5 mg by mouth 4 (four) times daily -  before meals and at bedtime.  05/13/15   Danie Binder, MD  NIFEdipine (PROCARDIA XL/ADALAT-CC) 90 MG 24 hr tablet Take 90 mg by mouth daily. 04/10/15   Historical Provider, MD  omeprazole (PRILOSEC) 20 MG capsule Take 1 capsule (20 mg total) by mouth daily. 07/29/14   Orvil Feil, NP  senna-docusate (SENOKOT-S) 8.6-50 MG tablet Take 1 tablet by mouth 2 (two) times daily.     Historical Provider, MD  sevelamer carbonate (RENVELA) 800 MG tablet Take 2,400 mg by mouth 3 (three) times daily with meals.     Historical Provider, MD   BP 190/74 mmHg   Pulse 75  Temp(Src) 98 F (36.7 C) (Oral)  Resp 15  Ht 5\' 6"  (1.676 m)  Wt 150 lb (68.04 kg)  BMI 24.22 kg/m2  SpO2 100% Physical Exam  Constitutional: She is oriented to person, place, and time. She appears well-developed and well-nourished. No distress.  HENT:  Head: Normocephalic.  Eyes: Conjunctivae are normal. Pupils are equal, round, and reactive to light. No scleral icterus.  Neck: Normal range of motion. Neck supple. No thyromegaly present.  Cardiovascular: Normal rate and regular rhythm.  Exam reveals no gallop and no friction rub.   No murmur heard. Pulmonary/Chest: Effort normal and breath sounds normal. No respiratory distress. She has no wheezes. She has no rales.  Minimally diminished at the bases, but no craclkes.   Abdominal: Soft. Bowel sounds are normal. She exhibits no distension. There is no tenderness. There is no rebound.  Musculoskeletal: Normal range of motion. She exhibits edema.  1+ lower extremity edema. AV fistula right upper arm.   Neurological: She is alert and oriented to person, place, and time.  Skin: Skin is warm and dry. No rash noted.  Psychiatric: She has a normal mood and affect. Her behavior is normal.  Nursing note and vitals reviewed.   ED Course  Procedures (including critical care time) DIAGNOSTIC STUDIES: Oxygen Saturation is 100% on RA, normal by my interpretation.    COORDINATION OF CARE: 12:25 PM Discussed treatment plan with pt at bedside and pt agreed to plan.   Labs Review Labs Reviewed  BASIC METABOLIC PANEL - Abnormal; Notable for the following:    Chloride 99 (*)    Creatinine, Ser 2.99 (*)    GFR calc non Af Amer 15 (*)    GFR calc Af Amer 18 (*)    All other components within normal limits  CBC WITH DIFFERENTIAL/PLATELET - Abnormal; Notable for the following:    WBC 3.3 (*)    Hemoglobin 10.5 (*)    HCT 34.1 (*)    MCV 77.3 (*)    MCH 23.8 (*)    RDW 19.8 (*)    Neutro Abs 1.4 (*)    All other components  within normal limits    Imaging Review Dg Chest 2 View  09/11/2015  CLINICAL DATA:  Hypertension. EXAM: CHEST  2 VIEW COMPARISON:  08/19/2015 FINDINGS: Cardiomediastinal silhouette is mildly  enlarged. Mediastinal contours appear intact. The aorta is torturous. There is no evidence of focal airspace consolidation, pleural effusion or pneumothorax. The previously seen pulmonary edema has resolved. Osseous structures are without acute abnormality. Soft tissues are grossly normal. IMPRESSION: Cardiomegaly, otherwise no evidence of acute cardiopulmonary process. Electronically Signed   By: Fidela Salisbury M.D.   On: 09/11/2015 14:03   I have personally reviewed and evaluated these images and lab results as part of my medical decision-making.   EKG Interpretation   Date/Time:  Thursday September 11 2015 12:59:33 EST Ventricular Rate:  71 PR Interval:  177 QRS Duration: 88 QT Interval:  429 QTC Calculation: 466 R Axis:   70 Text Interpretation:  Sinus rhythm LAE, consider biatrial enlargement Left  ventricular hypertrophy Confirmed by Jeneen Rinks  MD, La[place] ([id number]) on 09/11/2015  1:09:43 PM      MDM   Final diagnoses:  Essential hypertension    Patient given IV hydralazine. Given her afternoon by mouth dose of hydralazine as well. Pressures are improving. Hyperkalemic. Not complaining of headache. No change in edema. Chest x-ray does not show failure. Recheck is 182/82. I think this is appropriate number. Plan is discharge home. Continue with her antihypertensives. Have asked her to be careful not to delay or miss her medications around her dialysis. She was given only one morning doses this morning.  Tanna Furry, MD 09/11/15 1459

## 2015-09-13 DIAGNOSIS — Z992 Dependence on renal dialysis: Secondary | ICD-10-CM | POA: Diagnosis not present

## 2015-09-13 DIAGNOSIS — D509 Iron deficiency anemia, unspecified: Secondary | ICD-10-CM | POA: Diagnosis not present

## 2015-09-13 DIAGNOSIS — D631 Anemia in chronic kidney disease: Secondary | ICD-10-CM | POA: Diagnosis not present

## 2015-09-13 DIAGNOSIS — N2581 Secondary hyperparathyroidism of renal origin: Secondary | ICD-10-CM | POA: Diagnosis not present

## 2015-09-13 DIAGNOSIS — N186 End stage renal disease: Secondary | ICD-10-CM | POA: Diagnosis not present

## 2015-09-16 DIAGNOSIS — Z992 Dependence on renal dialysis: Secondary | ICD-10-CM | POA: Diagnosis not present

## 2015-09-16 DIAGNOSIS — N186 End stage renal disease: Secondary | ICD-10-CM | POA: Diagnosis not present

## 2015-09-16 DIAGNOSIS — N2581 Secondary hyperparathyroidism of renal origin: Secondary | ICD-10-CM | POA: Diagnosis not present

## 2015-09-16 DIAGNOSIS — D509 Iron deficiency anemia, unspecified: Secondary | ICD-10-CM | POA: Diagnosis not present

## 2015-09-16 DIAGNOSIS — D631 Anemia in chronic kidney disease: Secondary | ICD-10-CM | POA: Diagnosis not present

## 2015-09-18 DIAGNOSIS — D631 Anemia in chronic kidney disease: Secondary | ICD-10-CM | POA: Diagnosis not present

## 2015-09-18 DIAGNOSIS — Z992 Dependence on renal dialysis: Secondary | ICD-10-CM | POA: Diagnosis not present

## 2015-09-18 DIAGNOSIS — N2581 Secondary hyperparathyroidism of renal origin: Secondary | ICD-10-CM | POA: Diagnosis not present

## 2015-09-18 DIAGNOSIS — D509 Iron deficiency anemia, unspecified: Secondary | ICD-10-CM | POA: Diagnosis not present

## 2015-09-18 DIAGNOSIS — N186 End stage renal disease: Secondary | ICD-10-CM | POA: Diagnosis not present

## 2015-09-19 DIAGNOSIS — Z9181 History of falling: Secondary | ICD-10-CM | POA: Diagnosis not present

## 2015-09-19 DIAGNOSIS — R262 Difficulty in walking, not elsewhere classified: Secondary | ICD-10-CM | POA: Diagnosis not present

## 2015-09-19 DIAGNOSIS — I132 Hypertensive heart and chronic kidney disease with heart failure and with stage 5 chronic kidney disease, or end stage renal disease: Secondary | ICD-10-CM | POA: Diagnosis not present

## 2015-09-19 DIAGNOSIS — I5033 Acute on chronic diastolic (congestive) heart failure: Secondary | ICD-10-CM | POA: Diagnosis not present

## 2015-09-19 DIAGNOSIS — R269 Unspecified abnormalities of gait and mobility: Secondary | ICD-10-CM | POA: Diagnosis not present

## 2015-09-19 DIAGNOSIS — J811 Chronic pulmonary edema: Secondary | ICD-10-CM | POA: Diagnosis not present

## 2015-09-19 DIAGNOSIS — N186 End stage renal disease: Secondary | ICD-10-CM | POA: Diagnosis not present

## 2015-09-19 DIAGNOSIS — Z992 Dependence on renal dialysis: Secondary | ICD-10-CM | POA: Diagnosis not present

## 2015-09-20 DIAGNOSIS — D509 Iron deficiency anemia, unspecified: Secondary | ICD-10-CM | POA: Diagnosis not present

## 2015-09-20 DIAGNOSIS — N2581 Secondary hyperparathyroidism of renal origin: Secondary | ICD-10-CM | POA: Diagnosis not present

## 2015-09-20 DIAGNOSIS — Z992 Dependence on renal dialysis: Secondary | ICD-10-CM | POA: Diagnosis not present

## 2015-09-20 DIAGNOSIS — D631 Anemia in chronic kidney disease: Secondary | ICD-10-CM | POA: Diagnosis not present

## 2015-09-20 DIAGNOSIS — N186 End stage renal disease: Secondary | ICD-10-CM | POA: Diagnosis not present

## 2015-09-22 DIAGNOSIS — I5033 Acute on chronic diastolic (congestive) heart failure: Secondary | ICD-10-CM | POA: Diagnosis not present

## 2015-09-22 DIAGNOSIS — R262 Difficulty in walking, not elsewhere classified: Secondary | ICD-10-CM | POA: Diagnosis not present

## 2015-09-22 DIAGNOSIS — R269 Unspecified abnormalities of gait and mobility: Secondary | ICD-10-CM | POA: Diagnosis not present

## 2015-09-22 DIAGNOSIS — I132 Hypertensive heart and chronic kidney disease with heart failure and with stage 5 chronic kidney disease, or end stage renal disease: Secondary | ICD-10-CM | POA: Diagnosis not present

## 2015-09-22 DIAGNOSIS — N186 End stage renal disease: Secondary | ICD-10-CM | POA: Diagnosis not present

## 2015-09-22 DIAGNOSIS — Z9181 History of falling: Secondary | ICD-10-CM | POA: Diagnosis not present

## 2015-09-23 ENCOUNTER — Other Ambulatory Visit: Payer: Self-pay | Admitting: Neurology

## 2015-09-23 DIAGNOSIS — D631 Anemia in chronic kidney disease: Secondary | ICD-10-CM | POA: Diagnosis not present

## 2015-09-23 DIAGNOSIS — R269 Unspecified abnormalities of gait and mobility: Secondary | ICD-10-CM | POA: Diagnosis not present

## 2015-09-23 DIAGNOSIS — R29898 Other symptoms and signs involving the musculoskeletal system: Secondary | ICD-10-CM

## 2015-09-23 DIAGNOSIS — S0090XS Unspecified superficial injury of unspecified part of head, sequela: Secondary | ICD-10-CM | POA: Diagnosis not present

## 2015-09-23 DIAGNOSIS — N186 End stage renal disease: Secondary | ICD-10-CM | POA: Diagnosis not present

## 2015-09-23 DIAGNOSIS — M542 Cervicalgia: Secondary | ICD-10-CM | POA: Diagnosis not present

## 2015-09-23 DIAGNOSIS — I10 Essential (primary) hypertension: Secondary | ICD-10-CM | POA: Diagnosis not present

## 2015-09-23 DIAGNOSIS — Z992 Dependence on renal dialysis: Secondary | ICD-10-CM | POA: Diagnosis not present

## 2015-09-23 DIAGNOSIS — D509 Iron deficiency anemia, unspecified: Secondary | ICD-10-CM | POA: Diagnosis not present

## 2015-09-23 DIAGNOSIS — M4712 Other spondylosis with myelopathy, cervical region: Secondary | ICD-10-CM | POA: Diagnosis not present

## 2015-09-23 DIAGNOSIS — M130 Polyarthritis, unspecified: Secondary | ICD-10-CM | POA: Diagnosis not present

## 2015-09-23 DIAGNOSIS — G3184 Mild cognitive impairment, so stated: Secondary | ICD-10-CM | POA: Diagnosis not present

## 2015-09-23 DIAGNOSIS — N2581 Secondary hyperparathyroidism of renal origin: Secondary | ICD-10-CM | POA: Diagnosis not present

## 2015-09-24 DIAGNOSIS — R262 Difficulty in walking, not elsewhere classified: Secondary | ICD-10-CM | POA: Diagnosis not present

## 2015-09-24 DIAGNOSIS — I132 Hypertensive heart and chronic kidney disease with heart failure and with stage 5 chronic kidney disease, or end stage renal disease: Secondary | ICD-10-CM | POA: Diagnosis not present

## 2015-09-24 DIAGNOSIS — Z9181 History of falling: Secondary | ICD-10-CM | POA: Diagnosis not present

## 2015-09-24 DIAGNOSIS — N186 End stage renal disease: Secondary | ICD-10-CM | POA: Diagnosis not present

## 2015-09-24 DIAGNOSIS — R269 Unspecified abnormalities of gait and mobility: Secondary | ICD-10-CM | POA: Diagnosis not present

## 2015-09-24 DIAGNOSIS — I5033 Acute on chronic diastolic (congestive) heart failure: Secondary | ICD-10-CM | POA: Diagnosis not present

## 2015-09-25 DIAGNOSIS — D509 Iron deficiency anemia, unspecified: Secondary | ICD-10-CM | POA: Diagnosis not present

## 2015-09-25 DIAGNOSIS — D631 Anemia in chronic kidney disease: Secondary | ICD-10-CM | POA: Diagnosis not present

## 2015-09-25 DIAGNOSIS — N2581 Secondary hyperparathyroidism of renal origin: Secondary | ICD-10-CM | POA: Diagnosis not present

## 2015-09-25 DIAGNOSIS — F419 Anxiety disorder, unspecified: Secondary | ICD-10-CM | POA: Diagnosis not present

## 2015-09-25 DIAGNOSIS — I10 Essential (primary) hypertension: Secondary | ICD-10-CM | POA: Diagnosis not present

## 2015-09-25 DIAGNOSIS — N185 Chronic kidney disease, stage 5: Secondary | ICD-10-CM | POA: Diagnosis not present

## 2015-09-25 DIAGNOSIS — Z992 Dependence on renal dialysis: Secondary | ICD-10-CM | POA: Diagnosis not present

## 2015-09-25 DIAGNOSIS — I509 Heart failure, unspecified: Secondary | ICD-10-CM | POA: Diagnosis not present

## 2015-09-25 DIAGNOSIS — E063 Autoimmune thyroiditis: Secondary | ICD-10-CM | POA: Diagnosis not present

## 2015-09-25 DIAGNOSIS — N186 End stage renal disease: Secondary | ICD-10-CM | POA: Diagnosis not present

## 2015-09-25 DIAGNOSIS — Z681 Body mass index (BMI) 19 or less, adult: Secondary | ICD-10-CM | POA: Diagnosis not present

## 2015-09-25 DIAGNOSIS — M1991 Primary osteoarthritis, unspecified site: Secondary | ICD-10-CM | POA: Diagnosis not present

## 2015-09-25 DIAGNOSIS — Z1389 Encounter for screening for other disorder: Secondary | ICD-10-CM | POA: Diagnosis not present

## 2015-09-26 DIAGNOSIS — R262 Difficulty in walking, not elsewhere classified: Secondary | ICD-10-CM | POA: Diagnosis not present

## 2015-09-26 DIAGNOSIS — I132 Hypertensive heart and chronic kidney disease with heart failure and with stage 5 chronic kidney disease, or end stage renal disease: Secondary | ICD-10-CM | POA: Diagnosis not present

## 2015-09-26 DIAGNOSIS — R269 Unspecified abnormalities of gait and mobility: Secondary | ICD-10-CM | POA: Diagnosis not present

## 2015-09-26 DIAGNOSIS — N186 End stage renal disease: Secondary | ICD-10-CM | POA: Diagnosis not present

## 2015-09-26 DIAGNOSIS — I5033 Acute on chronic diastolic (congestive) heart failure: Secondary | ICD-10-CM | POA: Diagnosis not present

## 2015-09-26 DIAGNOSIS — Z9181 History of falling: Secondary | ICD-10-CM | POA: Diagnosis not present

## 2015-09-27 DIAGNOSIS — N2581 Secondary hyperparathyroidism of renal origin: Secondary | ICD-10-CM | POA: Diagnosis not present

## 2015-09-27 DIAGNOSIS — Z992 Dependence on renal dialysis: Secondary | ICD-10-CM | POA: Diagnosis not present

## 2015-09-27 DIAGNOSIS — N186 End stage renal disease: Secondary | ICD-10-CM | POA: Diagnosis not present

## 2015-09-27 DIAGNOSIS — D631 Anemia in chronic kidney disease: Secondary | ICD-10-CM | POA: Diagnosis not present

## 2015-09-27 DIAGNOSIS — D509 Iron deficiency anemia, unspecified: Secondary | ICD-10-CM | POA: Diagnosis not present

## 2015-09-29 DIAGNOSIS — R262 Difficulty in walking, not elsewhere classified: Secondary | ICD-10-CM | POA: Diagnosis not present

## 2015-09-29 DIAGNOSIS — N186 End stage renal disease: Secondary | ICD-10-CM | POA: Diagnosis not present

## 2015-09-29 DIAGNOSIS — I5033 Acute on chronic diastolic (congestive) heart failure: Secondary | ICD-10-CM | POA: Diagnosis not present

## 2015-09-29 DIAGNOSIS — R269 Unspecified abnormalities of gait and mobility: Secondary | ICD-10-CM | POA: Diagnosis not present

## 2015-09-29 DIAGNOSIS — Z9181 History of falling: Secondary | ICD-10-CM | POA: Diagnosis not present

## 2015-09-29 DIAGNOSIS — I132 Hypertensive heart and chronic kidney disease with heart failure and with stage 5 chronic kidney disease, or end stage renal disease: Secondary | ICD-10-CM | POA: Diagnosis not present

## 2015-09-30 DIAGNOSIS — N186 End stage renal disease: Secondary | ICD-10-CM | POA: Diagnosis not present

## 2015-09-30 DIAGNOSIS — D631 Anemia in chronic kidney disease: Secondary | ICD-10-CM | POA: Diagnosis not present

## 2015-09-30 DIAGNOSIS — Z992 Dependence on renal dialysis: Secondary | ICD-10-CM | POA: Diagnosis not present

## 2015-09-30 DIAGNOSIS — D509 Iron deficiency anemia, unspecified: Secondary | ICD-10-CM | POA: Diagnosis not present

## 2015-09-30 DIAGNOSIS — N2581 Secondary hyperparathyroidism of renal origin: Secondary | ICD-10-CM | POA: Diagnosis not present

## 2015-10-01 DIAGNOSIS — R262 Difficulty in walking, not elsewhere classified: Secondary | ICD-10-CM | POA: Diagnosis not present

## 2015-10-01 DIAGNOSIS — R269 Unspecified abnormalities of gait and mobility: Secondary | ICD-10-CM | POA: Diagnosis not present

## 2015-10-01 DIAGNOSIS — I5033 Acute on chronic diastolic (congestive) heart failure: Secondary | ICD-10-CM | POA: Diagnosis not present

## 2015-10-01 DIAGNOSIS — I132 Hypertensive heart and chronic kidney disease with heart failure and with stage 5 chronic kidney disease, or end stage renal disease: Secondary | ICD-10-CM | POA: Diagnosis not present

## 2015-10-01 DIAGNOSIS — N186 End stage renal disease: Secondary | ICD-10-CM | POA: Diagnosis not present

## 2015-10-01 DIAGNOSIS — Z9181 History of falling: Secondary | ICD-10-CM | POA: Diagnosis not present

## 2015-10-02 DIAGNOSIS — D631 Anemia in chronic kidney disease: Secondary | ICD-10-CM | POA: Diagnosis not present

## 2015-10-02 DIAGNOSIS — N186 End stage renal disease: Secondary | ICD-10-CM | POA: Diagnosis not present

## 2015-10-02 DIAGNOSIS — Z992 Dependence on renal dialysis: Secondary | ICD-10-CM | POA: Diagnosis not present

## 2015-10-02 DIAGNOSIS — N2581 Secondary hyperparathyroidism of renal origin: Secondary | ICD-10-CM | POA: Diagnosis not present

## 2015-10-02 DIAGNOSIS — D509 Iron deficiency anemia, unspecified: Secondary | ICD-10-CM | POA: Diagnosis not present

## 2015-10-03 DIAGNOSIS — I5033 Acute on chronic diastolic (congestive) heart failure: Secondary | ICD-10-CM | POA: Diagnosis not present

## 2015-10-03 DIAGNOSIS — N186 End stage renal disease: Secondary | ICD-10-CM | POA: Diagnosis not present

## 2015-10-03 DIAGNOSIS — R262 Difficulty in walking, not elsewhere classified: Secondary | ICD-10-CM | POA: Diagnosis not present

## 2015-10-03 DIAGNOSIS — I132 Hypertensive heart and chronic kidney disease with heart failure and with stage 5 chronic kidney disease, or end stage renal disease: Secondary | ICD-10-CM | POA: Diagnosis not present

## 2015-10-03 DIAGNOSIS — R269 Unspecified abnormalities of gait and mobility: Secondary | ICD-10-CM | POA: Diagnosis not present

## 2015-10-03 DIAGNOSIS — Z9181 History of falling: Secondary | ICD-10-CM | POA: Diagnosis not present

## 2015-10-04 DIAGNOSIS — N2581 Secondary hyperparathyroidism of renal origin: Secondary | ICD-10-CM | POA: Diagnosis not present

## 2015-10-04 DIAGNOSIS — D509 Iron deficiency anemia, unspecified: Secondary | ICD-10-CM | POA: Diagnosis not present

## 2015-10-04 DIAGNOSIS — N186 End stage renal disease: Secondary | ICD-10-CM | POA: Diagnosis not present

## 2015-10-04 DIAGNOSIS — Z992 Dependence on renal dialysis: Secondary | ICD-10-CM | POA: Diagnosis not present

## 2015-10-04 DIAGNOSIS — D631 Anemia in chronic kidney disease: Secondary | ICD-10-CM | POA: Diagnosis not present

## 2015-10-06 ENCOUNTER — Ambulatory Visit (HOSPITAL_COMMUNITY): Payer: Medicare Other

## 2015-10-06 DIAGNOSIS — R269 Unspecified abnormalities of gait and mobility: Secondary | ICD-10-CM | POA: Diagnosis not present

## 2015-10-06 DIAGNOSIS — Z9181 History of falling: Secondary | ICD-10-CM | POA: Diagnosis not present

## 2015-10-06 DIAGNOSIS — I132 Hypertensive heart and chronic kidney disease with heart failure and with stage 5 chronic kidney disease, or end stage renal disease: Secondary | ICD-10-CM | POA: Diagnosis not present

## 2015-10-06 DIAGNOSIS — R262 Difficulty in walking, not elsewhere classified: Secondary | ICD-10-CM | POA: Diagnosis not present

## 2015-10-06 DIAGNOSIS — N186 End stage renal disease: Secondary | ICD-10-CM | POA: Diagnosis not present

## 2015-10-06 DIAGNOSIS — I5033 Acute on chronic diastolic (congestive) heart failure: Secondary | ICD-10-CM | POA: Diagnosis not present

## 2015-10-07 DIAGNOSIS — D509 Iron deficiency anemia, unspecified: Secondary | ICD-10-CM | POA: Diagnosis not present

## 2015-10-07 DIAGNOSIS — Z992 Dependence on renal dialysis: Secondary | ICD-10-CM | POA: Diagnosis not present

## 2015-10-07 DIAGNOSIS — N186 End stage renal disease: Secondary | ICD-10-CM | POA: Diagnosis not present

## 2015-10-07 DIAGNOSIS — D631 Anemia in chronic kidney disease: Secondary | ICD-10-CM | POA: Diagnosis not present

## 2015-10-07 DIAGNOSIS — N2581 Secondary hyperparathyroidism of renal origin: Secondary | ICD-10-CM | POA: Diagnosis not present

## 2015-10-08 DIAGNOSIS — I5033 Acute on chronic diastolic (congestive) heart failure: Secondary | ICD-10-CM | POA: Diagnosis not present

## 2015-10-08 DIAGNOSIS — N186 End stage renal disease: Secondary | ICD-10-CM | POA: Diagnosis not present

## 2015-10-08 DIAGNOSIS — I132 Hypertensive heart and chronic kidney disease with heart failure and with stage 5 chronic kidney disease, or end stage renal disease: Secondary | ICD-10-CM | POA: Diagnosis not present

## 2015-10-08 DIAGNOSIS — R262 Difficulty in walking, not elsewhere classified: Secondary | ICD-10-CM | POA: Diagnosis not present

## 2015-10-08 DIAGNOSIS — R269 Unspecified abnormalities of gait and mobility: Secondary | ICD-10-CM | POA: Diagnosis not present

## 2015-10-08 DIAGNOSIS — Z9181 History of falling: Secondary | ICD-10-CM | POA: Diagnosis not present

## 2015-10-09 DIAGNOSIS — D509 Iron deficiency anemia, unspecified: Secondary | ICD-10-CM | POA: Diagnosis not present

## 2015-10-09 DIAGNOSIS — N186 End stage renal disease: Secondary | ICD-10-CM | POA: Diagnosis not present

## 2015-10-09 DIAGNOSIS — Z992 Dependence on renal dialysis: Secondary | ICD-10-CM | POA: Diagnosis not present

## 2015-10-09 DIAGNOSIS — N2581 Secondary hyperparathyroidism of renal origin: Secondary | ICD-10-CM | POA: Diagnosis not present

## 2015-10-09 DIAGNOSIS — D631 Anemia in chronic kidney disease: Secondary | ICD-10-CM | POA: Diagnosis not present

## 2015-10-10 DIAGNOSIS — N186 End stage renal disease: Secondary | ICD-10-CM | POA: Diagnosis not present

## 2015-10-10 DIAGNOSIS — I5033 Acute on chronic diastolic (congestive) heart failure: Secondary | ICD-10-CM | POA: Diagnosis not present

## 2015-10-10 DIAGNOSIS — R269 Unspecified abnormalities of gait and mobility: Secondary | ICD-10-CM | POA: Diagnosis not present

## 2015-10-10 DIAGNOSIS — I132 Hypertensive heart and chronic kidney disease with heart failure and with stage 5 chronic kidney disease, or end stage renal disease: Secondary | ICD-10-CM | POA: Diagnosis not present

## 2015-10-10 DIAGNOSIS — R262 Difficulty in walking, not elsewhere classified: Secondary | ICD-10-CM | POA: Diagnosis not present

## 2015-10-10 DIAGNOSIS — Z9181 History of falling: Secondary | ICD-10-CM | POA: Diagnosis not present

## 2015-10-11 DIAGNOSIS — D509 Iron deficiency anemia, unspecified: Secondary | ICD-10-CM | POA: Diagnosis not present

## 2015-10-11 DIAGNOSIS — N2581 Secondary hyperparathyroidism of renal origin: Secondary | ICD-10-CM | POA: Diagnosis not present

## 2015-10-11 DIAGNOSIS — D631 Anemia in chronic kidney disease: Secondary | ICD-10-CM | POA: Diagnosis not present

## 2015-10-11 DIAGNOSIS — N186 End stage renal disease: Secondary | ICD-10-CM | POA: Diagnosis not present

## 2015-10-11 DIAGNOSIS — Z992 Dependence on renal dialysis: Secondary | ICD-10-CM | POA: Diagnosis not present

## 2015-10-13 DIAGNOSIS — Z9181 History of falling: Secondary | ICD-10-CM | POA: Diagnosis not present

## 2015-10-13 DIAGNOSIS — I5033 Acute on chronic diastolic (congestive) heart failure: Secondary | ICD-10-CM | POA: Diagnosis not present

## 2015-10-13 DIAGNOSIS — R269 Unspecified abnormalities of gait and mobility: Secondary | ICD-10-CM | POA: Diagnosis not present

## 2015-10-13 DIAGNOSIS — N186 End stage renal disease: Secondary | ICD-10-CM | POA: Diagnosis not present

## 2015-10-13 DIAGNOSIS — I132 Hypertensive heart and chronic kidney disease with heart failure and with stage 5 chronic kidney disease, or end stage renal disease: Secondary | ICD-10-CM | POA: Diagnosis not present

## 2015-10-13 DIAGNOSIS — R262 Difficulty in walking, not elsewhere classified: Secondary | ICD-10-CM | POA: Diagnosis not present

## 2015-10-14 DIAGNOSIS — N186 End stage renal disease: Secondary | ICD-10-CM | POA: Diagnosis not present

## 2015-10-14 DIAGNOSIS — Z992 Dependence on renal dialysis: Secondary | ICD-10-CM | POA: Diagnosis not present

## 2015-10-14 DIAGNOSIS — N2581 Secondary hyperparathyroidism of renal origin: Secondary | ICD-10-CM | POA: Diagnosis not present

## 2015-10-14 DIAGNOSIS — D509 Iron deficiency anemia, unspecified: Secondary | ICD-10-CM | POA: Diagnosis not present

## 2015-10-14 DIAGNOSIS — D631 Anemia in chronic kidney disease: Secondary | ICD-10-CM | POA: Diagnosis not present

## 2015-10-15 DIAGNOSIS — I5033 Acute on chronic diastolic (congestive) heart failure: Secondary | ICD-10-CM | POA: Diagnosis not present

## 2015-10-15 DIAGNOSIS — N186 End stage renal disease: Secondary | ICD-10-CM | POA: Diagnosis not present

## 2015-10-15 DIAGNOSIS — I132 Hypertensive heart and chronic kidney disease with heart failure and with stage 5 chronic kidney disease, or end stage renal disease: Secondary | ICD-10-CM | POA: Diagnosis not present

## 2015-10-15 DIAGNOSIS — R269 Unspecified abnormalities of gait and mobility: Secondary | ICD-10-CM | POA: Diagnosis not present

## 2015-10-15 DIAGNOSIS — R262 Difficulty in walking, not elsewhere classified: Secondary | ICD-10-CM | POA: Diagnosis not present

## 2015-10-15 DIAGNOSIS — Z9181 History of falling: Secondary | ICD-10-CM | POA: Diagnosis not present

## 2015-10-16 DIAGNOSIS — D509 Iron deficiency anemia, unspecified: Secondary | ICD-10-CM | POA: Diagnosis not present

## 2015-10-16 DIAGNOSIS — N2581 Secondary hyperparathyroidism of renal origin: Secondary | ICD-10-CM | POA: Diagnosis not present

## 2015-10-16 DIAGNOSIS — D631 Anemia in chronic kidney disease: Secondary | ICD-10-CM | POA: Diagnosis not present

## 2015-10-16 DIAGNOSIS — Z992 Dependence on renal dialysis: Secondary | ICD-10-CM | POA: Diagnosis not present

## 2015-10-16 DIAGNOSIS — N186 End stage renal disease: Secondary | ICD-10-CM | POA: Diagnosis not present

## 2015-10-17 DIAGNOSIS — G3184 Mild cognitive impairment, so stated: Secondary | ICD-10-CM | POA: Diagnosis not present

## 2015-10-17 DIAGNOSIS — I10 Essential (primary) hypertension: Secondary | ICD-10-CM | POA: Diagnosis not present

## 2015-10-17 DIAGNOSIS — M130 Polyarthritis, unspecified: Secondary | ICD-10-CM | POA: Diagnosis not present

## 2015-10-17 DIAGNOSIS — R269 Unspecified abnormalities of gait and mobility: Secondary | ICD-10-CM | POA: Diagnosis not present

## 2015-10-17 DIAGNOSIS — M4712 Other spondylosis with myelopathy, cervical region: Secondary | ICD-10-CM | POA: Diagnosis not present

## 2015-10-17 DIAGNOSIS — S0090XS Unspecified superficial injury of unspecified part of head, sequela: Secondary | ICD-10-CM | POA: Diagnosis not present

## 2015-10-17 DIAGNOSIS — M542 Cervicalgia: Secondary | ICD-10-CM | POA: Diagnosis not present

## 2015-10-18 DIAGNOSIS — D509 Iron deficiency anemia, unspecified: Secondary | ICD-10-CM | POA: Diagnosis not present

## 2015-10-18 DIAGNOSIS — D631 Anemia in chronic kidney disease: Secondary | ICD-10-CM | POA: Diagnosis not present

## 2015-10-18 DIAGNOSIS — Z992 Dependence on renal dialysis: Secondary | ICD-10-CM | POA: Diagnosis not present

## 2015-10-18 DIAGNOSIS — N186 End stage renal disease: Secondary | ICD-10-CM | POA: Diagnosis not present

## 2015-10-18 DIAGNOSIS — N2581 Secondary hyperparathyroidism of renal origin: Secondary | ICD-10-CM | POA: Diagnosis not present

## 2015-10-20 DIAGNOSIS — R262 Difficulty in walking, not elsewhere classified: Secondary | ICD-10-CM | POA: Diagnosis not present

## 2015-10-20 DIAGNOSIS — N186 End stage renal disease: Secondary | ICD-10-CM | POA: Diagnosis not present

## 2015-10-20 DIAGNOSIS — Z9181 History of falling: Secondary | ICD-10-CM | POA: Diagnosis not present

## 2015-10-20 DIAGNOSIS — I132 Hypertensive heart and chronic kidney disease with heart failure and with stage 5 chronic kidney disease, or end stage renal disease: Secondary | ICD-10-CM | POA: Diagnosis not present

## 2015-10-20 DIAGNOSIS — R269 Unspecified abnormalities of gait and mobility: Secondary | ICD-10-CM | POA: Diagnosis not present

## 2015-10-20 DIAGNOSIS — I5033 Acute on chronic diastolic (congestive) heart failure: Secondary | ICD-10-CM | POA: Diagnosis not present

## 2015-10-21 DIAGNOSIS — D509 Iron deficiency anemia, unspecified: Secondary | ICD-10-CM | POA: Diagnosis not present

## 2015-10-21 DIAGNOSIS — D631 Anemia in chronic kidney disease: Secondary | ICD-10-CM | POA: Diagnosis not present

## 2015-10-21 DIAGNOSIS — Z992 Dependence on renal dialysis: Secondary | ICD-10-CM | POA: Diagnosis not present

## 2015-10-21 DIAGNOSIS — N186 End stage renal disease: Secondary | ICD-10-CM | POA: Diagnosis not present

## 2015-10-21 DIAGNOSIS — N2581 Secondary hyperparathyroidism of renal origin: Secondary | ICD-10-CM | POA: Diagnosis not present

## 2015-10-23 DIAGNOSIS — Z992 Dependence on renal dialysis: Secondary | ICD-10-CM | POA: Diagnosis not present

## 2015-10-23 DIAGNOSIS — N186 End stage renal disease: Secondary | ICD-10-CM | POA: Diagnosis not present

## 2015-10-23 DIAGNOSIS — N2581 Secondary hyperparathyroidism of renal origin: Secondary | ICD-10-CM | POA: Diagnosis not present

## 2015-10-23 DIAGNOSIS — D631 Anemia in chronic kidney disease: Secondary | ICD-10-CM | POA: Diagnosis not present

## 2015-10-23 DIAGNOSIS — D509 Iron deficiency anemia, unspecified: Secondary | ICD-10-CM | POA: Diagnosis not present

## 2015-10-24 DIAGNOSIS — I5033 Acute on chronic diastolic (congestive) heart failure: Secondary | ICD-10-CM | POA: Diagnosis not present

## 2015-10-24 DIAGNOSIS — R269 Unspecified abnormalities of gait and mobility: Secondary | ICD-10-CM | POA: Diagnosis not present

## 2015-10-24 DIAGNOSIS — I132 Hypertensive heart and chronic kidney disease with heart failure and with stage 5 chronic kidney disease, or end stage renal disease: Secondary | ICD-10-CM | POA: Diagnosis not present

## 2015-10-24 DIAGNOSIS — Z9181 History of falling: Secondary | ICD-10-CM | POA: Diagnosis not present

## 2015-10-24 DIAGNOSIS — N186 End stage renal disease: Secondary | ICD-10-CM | POA: Diagnosis not present

## 2015-10-24 DIAGNOSIS — R262 Difficulty in walking, not elsewhere classified: Secondary | ICD-10-CM | POA: Diagnosis not present

## 2015-10-25 DIAGNOSIS — D509 Iron deficiency anemia, unspecified: Secondary | ICD-10-CM | POA: Diagnosis not present

## 2015-10-25 DIAGNOSIS — Z992 Dependence on renal dialysis: Secondary | ICD-10-CM | POA: Diagnosis not present

## 2015-10-25 DIAGNOSIS — D631 Anemia in chronic kidney disease: Secondary | ICD-10-CM | POA: Diagnosis not present

## 2015-10-25 DIAGNOSIS — N2581 Secondary hyperparathyroidism of renal origin: Secondary | ICD-10-CM | POA: Diagnosis not present

## 2015-10-25 DIAGNOSIS — N186 End stage renal disease: Secondary | ICD-10-CM | POA: Diagnosis not present

## 2015-10-27 ENCOUNTER — Ambulatory Visit (HOSPITAL_COMMUNITY)
Admission: RE | Admit: 2015-10-27 | Discharge: 2015-10-27 | Disposition: A | Discharge status: Home / Self Care | Payer: Medicare Other | Source: Ambulatory Visit | Attending: Neurology | Admitting: Neurology

## 2015-10-27 DIAGNOSIS — N186 End stage renal disease: Secondary | ICD-10-CM | POA: Diagnosis not present

## 2015-10-27 DIAGNOSIS — S299XXA Unspecified injury of thorax, initial encounter: Secondary | ICD-10-CM | POA: Diagnosis not present

## 2015-10-27 DIAGNOSIS — R531 Weakness: Secondary | ICD-10-CM | POA: Diagnosis not present

## 2015-10-27 DIAGNOSIS — R269 Unspecified abnormalities of gait and mobility: Secondary | ICD-10-CM | POA: Diagnosis not present

## 2015-10-27 DIAGNOSIS — Y92128 Other place in nursing home as the place of occurrence of the external cause: Secondary | ICD-10-CM | POA: Diagnosis not present

## 2015-10-27 DIAGNOSIS — M542 Cervicalgia: Secondary | ICD-10-CM | POA: Diagnosis present

## 2015-10-27 DIAGNOSIS — W1830XA Fall on same level, unspecified, initial encounter: Secondary | ICD-10-CM | POA: Diagnosis not present

## 2015-10-27 DIAGNOSIS — M5136 Other intervertebral disc degeneration, lumbar region: Secondary | ICD-10-CM | POA: Insufficient documentation

## 2015-10-27 DIAGNOSIS — Z9181 History of falling: Secondary | ICD-10-CM | POA: Diagnosis not present

## 2015-10-27 DIAGNOSIS — M546 Pain in thoracic spine: Secondary | ICD-10-CM | POA: Diagnosis not present

## 2015-10-27 DIAGNOSIS — R262 Difficulty in walking, not elsewhere classified: Secondary | ICD-10-CM | POA: Diagnosis not present

## 2015-10-27 DIAGNOSIS — R29898 Other symptoms and signs involving the musculoskeletal system: Secondary | ICD-10-CM

## 2015-10-27 DIAGNOSIS — S199XXA Unspecified injury of neck, initial encounter: Secondary | ICD-10-CM | POA: Diagnosis not present

## 2015-10-27 DIAGNOSIS — I5033 Acute on chronic diastolic (congestive) heart failure: Secondary | ICD-10-CM | POA: Diagnosis not present

## 2015-10-27 DIAGNOSIS — I132 Hypertensive heart and chronic kidney disease with heart failure and with stage 5 chronic kidney disease, or end stage renal disease: Secondary | ICD-10-CM | POA: Diagnosis not present

## 2015-10-28 DIAGNOSIS — N2581 Secondary hyperparathyroidism of renal origin: Secondary | ICD-10-CM | POA: Diagnosis not present

## 2015-10-28 DIAGNOSIS — Z992 Dependence on renal dialysis: Secondary | ICD-10-CM | POA: Diagnosis not present

## 2015-10-28 DIAGNOSIS — N186 End stage renal disease: Secondary | ICD-10-CM | POA: Diagnosis not present

## 2015-10-28 DIAGNOSIS — D509 Iron deficiency anemia, unspecified: Secondary | ICD-10-CM | POA: Diagnosis not present

## 2015-10-28 DIAGNOSIS — D631 Anemia in chronic kidney disease: Secondary | ICD-10-CM | POA: Diagnosis not present

## 2015-10-29 DIAGNOSIS — N186 End stage renal disease: Secondary | ICD-10-CM | POA: Diagnosis not present

## 2015-10-29 DIAGNOSIS — Z9181 History of falling: Secondary | ICD-10-CM | POA: Diagnosis not present

## 2015-10-29 DIAGNOSIS — R262 Difficulty in walking, not elsewhere classified: Secondary | ICD-10-CM | POA: Diagnosis not present

## 2015-10-29 DIAGNOSIS — I132 Hypertensive heart and chronic kidney disease with heart failure and with stage 5 chronic kidney disease, or end stage renal disease: Secondary | ICD-10-CM | POA: Diagnosis not present

## 2015-10-29 DIAGNOSIS — R269 Unspecified abnormalities of gait and mobility: Secondary | ICD-10-CM | POA: Diagnosis not present

## 2015-10-29 DIAGNOSIS — I5033 Acute on chronic diastolic (congestive) heart failure: Secondary | ICD-10-CM | POA: Diagnosis not present

## 2015-10-30 DIAGNOSIS — D631 Anemia in chronic kidney disease: Secondary | ICD-10-CM | POA: Diagnosis not present

## 2015-10-30 DIAGNOSIS — N2581 Secondary hyperparathyroidism of renal origin: Secondary | ICD-10-CM | POA: Diagnosis not present

## 2015-10-30 DIAGNOSIS — D509 Iron deficiency anemia, unspecified: Secondary | ICD-10-CM | POA: Diagnosis not present

## 2015-10-30 DIAGNOSIS — Z992 Dependence on renal dialysis: Secondary | ICD-10-CM | POA: Diagnosis not present

## 2015-10-30 DIAGNOSIS — N186 End stage renal disease: Secondary | ICD-10-CM | POA: Diagnosis not present

## 2015-10-31 DIAGNOSIS — N186 End stage renal disease: Secondary | ICD-10-CM | POA: Diagnosis not present

## 2015-10-31 DIAGNOSIS — Z992 Dependence on renal dialysis: Secondary | ICD-10-CM | POA: Diagnosis not present

## 2015-11-01 DIAGNOSIS — D631 Anemia in chronic kidney disease: Secondary | ICD-10-CM | POA: Diagnosis not present

## 2015-11-01 DIAGNOSIS — D509 Iron deficiency anemia, unspecified: Secondary | ICD-10-CM | POA: Diagnosis not present

## 2015-11-01 DIAGNOSIS — Z992 Dependence on renal dialysis: Secondary | ICD-10-CM | POA: Diagnosis not present

## 2015-11-01 DIAGNOSIS — N2581 Secondary hyperparathyroidism of renal origin: Secondary | ICD-10-CM | POA: Diagnosis not present

## 2015-11-01 DIAGNOSIS — N186 End stage renal disease: Secondary | ICD-10-CM | POA: Diagnosis not present

## 2015-11-03 DIAGNOSIS — H35033 Hypertensive retinopathy, bilateral: Secondary | ICD-10-CM | POA: Diagnosis not present

## 2015-11-03 DIAGNOSIS — I132 Hypertensive heart and chronic kidney disease with heart failure and with stage 5 chronic kidney disease, or end stage renal disease: Secondary | ICD-10-CM | POA: Diagnosis not present

## 2015-11-03 DIAGNOSIS — Z9181 History of falling: Secondary | ICD-10-CM | POA: Diagnosis not present

## 2015-11-03 DIAGNOSIS — I5033 Acute on chronic diastolic (congestive) heart failure: Secondary | ICD-10-CM | POA: Diagnosis not present

## 2015-11-03 DIAGNOSIS — N186 End stage renal disease: Secondary | ICD-10-CM | POA: Diagnosis not present

## 2015-11-03 DIAGNOSIS — R262 Difficulty in walking, not elsewhere classified: Secondary | ICD-10-CM | POA: Diagnosis not present

## 2015-11-03 DIAGNOSIS — R269 Unspecified abnormalities of gait and mobility: Secondary | ICD-10-CM | POA: Diagnosis not present

## 2015-11-04 DIAGNOSIS — Z992 Dependence on renal dialysis: Secondary | ICD-10-CM | POA: Diagnosis not present

## 2015-11-04 DIAGNOSIS — D631 Anemia in chronic kidney disease: Secondary | ICD-10-CM | POA: Diagnosis not present

## 2015-11-04 DIAGNOSIS — N186 End stage renal disease: Secondary | ICD-10-CM | POA: Diagnosis not present

## 2015-11-04 DIAGNOSIS — D509 Iron deficiency anemia, unspecified: Secondary | ICD-10-CM | POA: Diagnosis not present

## 2015-11-04 DIAGNOSIS — N2581 Secondary hyperparathyroidism of renal origin: Secondary | ICD-10-CM | POA: Diagnosis not present

## 2015-11-05 ENCOUNTER — Other Ambulatory Visit: Payer: Self-pay | Admitting: Gastroenterology

## 2015-11-05 DIAGNOSIS — Z681 Body mass index (BMI) 19 or less, adult: Secondary | ICD-10-CM | POA: Diagnosis not present

## 2015-11-05 DIAGNOSIS — I10 Essential (primary) hypertension: Secondary | ICD-10-CM | POA: Diagnosis not present

## 2015-11-05 DIAGNOSIS — M4712 Other spondylosis with myelopathy, cervical region: Secondary | ICD-10-CM | POA: Diagnosis not present

## 2015-11-05 DIAGNOSIS — G3184 Mild cognitive impairment, so stated: Secondary | ICD-10-CM | POA: Diagnosis not present

## 2015-11-05 DIAGNOSIS — N185 Chronic kidney disease, stage 5: Secondary | ICD-10-CM | POA: Diagnosis not present

## 2015-11-05 DIAGNOSIS — F419 Anxiety disorder, unspecified: Secondary | ICD-10-CM | POA: Diagnosis not present

## 2015-11-05 DIAGNOSIS — N186 End stage renal disease: Secondary | ICD-10-CM | POA: Diagnosis not present

## 2015-11-05 DIAGNOSIS — E063 Autoimmune thyroiditis: Secondary | ICD-10-CM | POA: Diagnosis not present

## 2015-11-05 DIAGNOSIS — Z1389 Encounter for screening for other disorder: Secondary | ICD-10-CM | POA: Diagnosis not present

## 2015-11-05 DIAGNOSIS — F0150 Vascular dementia without behavioral disturbance: Secondary | ICD-10-CM | POA: Diagnosis not present

## 2015-11-05 DIAGNOSIS — R269 Unspecified abnormalities of gait and mobility: Secondary | ICD-10-CM | POA: Diagnosis not present

## 2015-11-05 DIAGNOSIS — S0090XS Unspecified superficial injury of unspecified part of head, sequela: Secondary | ICD-10-CM | POA: Diagnosis not present

## 2015-11-05 DIAGNOSIS — M130 Polyarthritis, unspecified: Secondary | ICD-10-CM | POA: Diagnosis not present

## 2015-11-05 DIAGNOSIS — M542 Cervicalgia: Secondary | ICD-10-CM | POA: Diagnosis not present

## 2015-11-06 DIAGNOSIS — D631 Anemia in chronic kidney disease: Secondary | ICD-10-CM | POA: Diagnosis not present

## 2015-11-06 DIAGNOSIS — D509 Iron deficiency anemia, unspecified: Secondary | ICD-10-CM | POA: Diagnosis not present

## 2015-11-06 DIAGNOSIS — Z992 Dependence on renal dialysis: Secondary | ICD-10-CM | POA: Diagnosis not present

## 2015-11-06 DIAGNOSIS — N186 End stage renal disease: Secondary | ICD-10-CM | POA: Diagnosis not present

## 2015-11-06 DIAGNOSIS — N2581 Secondary hyperparathyroidism of renal origin: Secondary | ICD-10-CM | POA: Diagnosis not present

## 2015-11-07 DIAGNOSIS — N186 End stage renal disease: Secondary | ICD-10-CM | POA: Diagnosis not present

## 2015-11-07 DIAGNOSIS — Z9181 History of falling: Secondary | ICD-10-CM | POA: Diagnosis not present

## 2015-11-07 DIAGNOSIS — I5033 Acute on chronic diastolic (congestive) heart failure: Secondary | ICD-10-CM | POA: Diagnosis not present

## 2015-11-07 DIAGNOSIS — R269 Unspecified abnormalities of gait and mobility: Secondary | ICD-10-CM | POA: Diagnosis not present

## 2015-11-07 DIAGNOSIS — R262 Difficulty in walking, not elsewhere classified: Secondary | ICD-10-CM | POA: Diagnosis not present

## 2015-11-07 DIAGNOSIS — I132 Hypertensive heart and chronic kidney disease with heart failure and with stage 5 chronic kidney disease, or end stage renal disease: Secondary | ICD-10-CM | POA: Diagnosis not present

## 2015-11-08 DIAGNOSIS — N186 End stage renal disease: Secondary | ICD-10-CM | POA: Diagnosis not present

## 2015-11-08 DIAGNOSIS — N2581 Secondary hyperparathyroidism of renal origin: Secondary | ICD-10-CM | POA: Diagnosis not present

## 2015-11-08 DIAGNOSIS — D509 Iron deficiency anemia, unspecified: Secondary | ICD-10-CM | POA: Diagnosis not present

## 2015-11-08 DIAGNOSIS — D631 Anemia in chronic kidney disease: Secondary | ICD-10-CM | POA: Diagnosis not present

## 2015-11-08 DIAGNOSIS — Z992 Dependence on renal dialysis: Secondary | ICD-10-CM | POA: Diagnosis not present

## 2015-11-10 DIAGNOSIS — R262 Difficulty in walking, not elsewhere classified: Secondary | ICD-10-CM | POA: Diagnosis not present

## 2015-11-10 DIAGNOSIS — I132 Hypertensive heart and chronic kidney disease with heart failure and with stage 5 chronic kidney disease, or end stage renal disease: Secondary | ICD-10-CM | POA: Diagnosis not present

## 2015-11-10 DIAGNOSIS — Z9181 History of falling: Secondary | ICD-10-CM | POA: Diagnosis not present

## 2015-11-10 DIAGNOSIS — R269 Unspecified abnormalities of gait and mobility: Secondary | ICD-10-CM | POA: Diagnosis not present

## 2015-11-10 DIAGNOSIS — I5033 Acute on chronic diastolic (congestive) heart failure: Secondary | ICD-10-CM | POA: Diagnosis not present

## 2015-11-10 DIAGNOSIS — N186 End stage renal disease: Secondary | ICD-10-CM | POA: Diagnosis not present

## 2015-11-11 DIAGNOSIS — Z992 Dependence on renal dialysis: Secondary | ICD-10-CM | POA: Diagnosis not present

## 2015-11-11 DIAGNOSIS — N2581 Secondary hyperparathyroidism of renal origin: Secondary | ICD-10-CM | POA: Diagnosis not present

## 2015-11-11 DIAGNOSIS — N186 End stage renal disease: Secondary | ICD-10-CM | POA: Diagnosis not present

## 2015-11-11 DIAGNOSIS — D509 Iron deficiency anemia, unspecified: Secondary | ICD-10-CM | POA: Diagnosis not present

## 2015-11-11 DIAGNOSIS — D631 Anemia in chronic kidney disease: Secondary | ICD-10-CM | POA: Diagnosis not present

## 2015-11-12 DIAGNOSIS — Z0000 Encounter for general adult medical examination without abnormal findings: Secondary | ICD-10-CM | POA: Diagnosis not present

## 2015-11-12 DIAGNOSIS — R269 Unspecified abnormalities of gait and mobility: Secondary | ICD-10-CM | POA: Diagnosis not present

## 2015-11-12 DIAGNOSIS — Z1389 Encounter for screening for other disorder: Secondary | ICD-10-CM | POA: Diagnosis not present

## 2015-11-12 DIAGNOSIS — Z9181 History of falling: Secondary | ICD-10-CM | POA: Diagnosis not present

## 2015-11-12 DIAGNOSIS — Z681 Body mass index (BMI) 19 or less, adult: Secondary | ICD-10-CM | POA: Diagnosis not present

## 2015-11-12 DIAGNOSIS — N186 End stage renal disease: Secondary | ICD-10-CM | POA: Diagnosis not present

## 2015-11-12 DIAGNOSIS — R262 Difficulty in walking, not elsewhere classified: Secondary | ICD-10-CM | POA: Diagnosis not present

## 2015-11-12 DIAGNOSIS — I132 Hypertensive heart and chronic kidney disease with heart failure and with stage 5 chronic kidney disease, or end stage renal disease: Secondary | ICD-10-CM | POA: Diagnosis not present

## 2015-11-12 DIAGNOSIS — I5033 Acute on chronic diastolic (congestive) heart failure: Secondary | ICD-10-CM | POA: Diagnosis not present

## 2015-11-13 DIAGNOSIS — N2581 Secondary hyperparathyroidism of renal origin: Secondary | ICD-10-CM | POA: Diagnosis not present

## 2015-11-13 DIAGNOSIS — Z992 Dependence on renal dialysis: Secondary | ICD-10-CM | POA: Diagnosis not present

## 2015-11-13 DIAGNOSIS — N186 End stage renal disease: Secondary | ICD-10-CM | POA: Diagnosis not present

## 2015-11-13 DIAGNOSIS — D509 Iron deficiency anemia, unspecified: Secondary | ICD-10-CM | POA: Diagnosis not present

## 2015-11-13 DIAGNOSIS — D631 Anemia in chronic kidney disease: Secondary | ICD-10-CM | POA: Diagnosis not present

## 2015-11-15 DIAGNOSIS — Z992 Dependence on renal dialysis: Secondary | ICD-10-CM | POA: Diagnosis not present

## 2015-11-15 DIAGNOSIS — N186 End stage renal disease: Secondary | ICD-10-CM | POA: Diagnosis not present

## 2015-11-15 DIAGNOSIS — D631 Anemia in chronic kidney disease: Secondary | ICD-10-CM | POA: Diagnosis not present

## 2015-11-15 DIAGNOSIS — D509 Iron deficiency anemia, unspecified: Secondary | ICD-10-CM | POA: Diagnosis not present

## 2015-11-15 DIAGNOSIS — N2581 Secondary hyperparathyroidism of renal origin: Secondary | ICD-10-CM | POA: Diagnosis not present

## 2015-11-18 DIAGNOSIS — D631 Anemia in chronic kidney disease: Secondary | ICD-10-CM | POA: Diagnosis not present

## 2015-11-18 DIAGNOSIS — I5033 Acute on chronic diastolic (congestive) heart failure: Secondary | ICD-10-CM | POA: Diagnosis not present

## 2015-11-18 DIAGNOSIS — N2581 Secondary hyperparathyroidism of renal origin: Secondary | ICD-10-CM | POA: Diagnosis not present

## 2015-11-18 DIAGNOSIS — D509 Iron deficiency anemia, unspecified: Secondary | ICD-10-CM | POA: Diagnosis not present

## 2015-11-18 DIAGNOSIS — Z9181 History of falling: Secondary | ICD-10-CM | POA: Diagnosis not present

## 2015-11-18 DIAGNOSIS — J811 Chronic pulmonary edema: Secondary | ICD-10-CM | POA: Diagnosis not present

## 2015-11-18 DIAGNOSIS — R2689 Other abnormalities of gait and mobility: Secondary | ICD-10-CM | POA: Diagnosis not present

## 2015-11-18 DIAGNOSIS — N186 End stage renal disease: Secondary | ICD-10-CM | POA: Diagnosis not present

## 2015-11-18 DIAGNOSIS — Z992 Dependence on renal dialysis: Secondary | ICD-10-CM | POA: Diagnosis not present

## 2015-11-18 DIAGNOSIS — I132 Hypertensive heart and chronic kidney disease with heart failure and with stage 5 chronic kidney disease, or end stage renal disease: Secondary | ICD-10-CM | POA: Diagnosis not present

## 2015-11-20 DIAGNOSIS — D509 Iron deficiency anemia, unspecified: Secondary | ICD-10-CM | POA: Diagnosis not present

## 2015-11-20 DIAGNOSIS — Z992 Dependence on renal dialysis: Secondary | ICD-10-CM | POA: Diagnosis not present

## 2015-11-20 DIAGNOSIS — D631 Anemia in chronic kidney disease: Secondary | ICD-10-CM | POA: Diagnosis not present

## 2015-11-20 DIAGNOSIS — N186 End stage renal disease: Secondary | ICD-10-CM | POA: Diagnosis not present

## 2015-11-20 DIAGNOSIS — N2581 Secondary hyperparathyroidism of renal origin: Secondary | ICD-10-CM | POA: Diagnosis not present

## 2015-11-21 DIAGNOSIS — J811 Chronic pulmonary edema: Secondary | ICD-10-CM | POA: Diagnosis not present

## 2015-11-21 DIAGNOSIS — Z9181 History of falling: Secondary | ICD-10-CM | POA: Diagnosis not present

## 2015-11-21 DIAGNOSIS — I132 Hypertensive heart and chronic kidney disease with heart failure and with stage 5 chronic kidney disease, or end stage renal disease: Secondary | ICD-10-CM | POA: Diagnosis not present

## 2015-11-21 DIAGNOSIS — N186 End stage renal disease: Secondary | ICD-10-CM | POA: Diagnosis not present

## 2015-11-21 DIAGNOSIS — I5033 Acute on chronic diastolic (congestive) heart failure: Secondary | ICD-10-CM | POA: Diagnosis not present

## 2015-11-21 DIAGNOSIS — R2689 Other abnormalities of gait and mobility: Secondary | ICD-10-CM | POA: Diagnosis not present

## 2015-11-22 DIAGNOSIS — N186 End stage renal disease: Secondary | ICD-10-CM | POA: Diagnosis not present

## 2015-11-22 DIAGNOSIS — D631 Anemia in chronic kidney disease: Secondary | ICD-10-CM | POA: Diagnosis not present

## 2015-11-22 DIAGNOSIS — N2581 Secondary hyperparathyroidism of renal origin: Secondary | ICD-10-CM | POA: Diagnosis not present

## 2015-11-22 DIAGNOSIS — D509 Iron deficiency anemia, unspecified: Secondary | ICD-10-CM | POA: Diagnosis not present

## 2015-11-22 DIAGNOSIS — Z992 Dependence on renal dialysis: Secondary | ICD-10-CM | POA: Diagnosis not present

## 2015-11-25 DIAGNOSIS — D631 Anemia in chronic kidney disease: Secondary | ICD-10-CM | POA: Diagnosis not present

## 2015-11-25 DIAGNOSIS — N186 End stage renal disease: Secondary | ICD-10-CM | POA: Diagnosis not present

## 2015-11-25 DIAGNOSIS — D509 Iron deficiency anemia, unspecified: Secondary | ICD-10-CM | POA: Diagnosis not present

## 2015-11-25 DIAGNOSIS — N2581 Secondary hyperparathyroidism of renal origin: Secondary | ICD-10-CM | POA: Diagnosis not present

## 2015-11-25 DIAGNOSIS — Z992 Dependence on renal dialysis: Secondary | ICD-10-CM | POA: Diagnosis not present

## 2015-11-26 DIAGNOSIS — I132 Hypertensive heart and chronic kidney disease with heart failure and with stage 5 chronic kidney disease, or end stage renal disease: Secondary | ICD-10-CM | POA: Diagnosis not present

## 2015-11-26 DIAGNOSIS — Z9181 History of falling: Secondary | ICD-10-CM | POA: Diagnosis not present

## 2015-11-26 DIAGNOSIS — R2689 Other abnormalities of gait and mobility: Secondary | ICD-10-CM | POA: Diagnosis not present

## 2015-11-26 DIAGNOSIS — I5033 Acute on chronic diastolic (congestive) heart failure: Secondary | ICD-10-CM | POA: Diagnosis not present

## 2015-11-26 DIAGNOSIS — J811 Chronic pulmonary edema: Secondary | ICD-10-CM | POA: Diagnosis not present

## 2015-11-26 DIAGNOSIS — N186 End stage renal disease: Secondary | ICD-10-CM | POA: Diagnosis not present

## 2015-11-27 DIAGNOSIS — N186 End stage renal disease: Secondary | ICD-10-CM | POA: Diagnosis not present

## 2015-11-27 DIAGNOSIS — Z992 Dependence on renal dialysis: Secondary | ICD-10-CM | POA: Diagnosis not present

## 2015-11-27 DIAGNOSIS — D631 Anemia in chronic kidney disease: Secondary | ICD-10-CM | POA: Diagnosis not present

## 2015-11-27 DIAGNOSIS — D509 Iron deficiency anemia, unspecified: Secondary | ICD-10-CM | POA: Diagnosis not present

## 2015-11-27 DIAGNOSIS — N2581 Secondary hyperparathyroidism of renal origin: Secondary | ICD-10-CM | POA: Diagnosis not present

## 2015-11-28 DIAGNOSIS — Z9181 History of falling: Secondary | ICD-10-CM | POA: Diagnosis not present

## 2015-11-28 DIAGNOSIS — R2689 Other abnormalities of gait and mobility: Secondary | ICD-10-CM | POA: Diagnosis not present

## 2015-11-28 DIAGNOSIS — J811 Chronic pulmonary edema: Secondary | ICD-10-CM | POA: Diagnosis not present

## 2015-11-28 DIAGNOSIS — I132 Hypertensive heart and chronic kidney disease with heart failure and with stage 5 chronic kidney disease, or end stage renal disease: Secondary | ICD-10-CM | POA: Diagnosis not present

## 2015-11-28 DIAGNOSIS — I5033 Acute on chronic diastolic (congestive) heart failure: Secondary | ICD-10-CM | POA: Diagnosis not present

## 2015-11-28 DIAGNOSIS — N186 End stage renal disease: Secondary | ICD-10-CM | POA: Diagnosis not present

## 2015-11-29 DIAGNOSIS — Z992 Dependence on renal dialysis: Secondary | ICD-10-CM | POA: Diagnosis not present

## 2015-11-29 DIAGNOSIS — N2581 Secondary hyperparathyroidism of renal origin: Secondary | ICD-10-CM | POA: Diagnosis not present

## 2015-11-29 DIAGNOSIS — D631 Anemia in chronic kidney disease: Secondary | ICD-10-CM | POA: Diagnosis not present

## 2015-11-29 DIAGNOSIS — D509 Iron deficiency anemia, unspecified: Secondary | ICD-10-CM | POA: Diagnosis not present

## 2015-11-29 DIAGNOSIS — N186 End stage renal disease: Secondary | ICD-10-CM | POA: Diagnosis not present

## 2015-11-30 DIAGNOSIS — N186 End stage renal disease: Secondary | ICD-10-CM | POA: Diagnosis not present

## 2015-11-30 DIAGNOSIS — Z992 Dependence on renal dialysis: Secondary | ICD-10-CM | POA: Diagnosis not present

## 2015-12-01 ENCOUNTER — Encounter: Payer: Self-pay | Admitting: Gastroenterology

## 2015-12-01 ENCOUNTER — Ambulatory Visit (INDEPENDENT_AMBULATORY_CARE_PROVIDER_SITE_OTHER): Payer: Medicare Other | Admitting: Gastroenterology

## 2015-12-01 VITALS — BP 115/70 | HR 65 | Temp 97.5°F | Ht 67.0 in | Wt 127.0 lb

## 2015-12-01 DIAGNOSIS — I132 Hypertensive heart and chronic kidney disease with heart failure and with stage 5 chronic kidney disease, or end stage renal disease: Secondary | ICD-10-CM | POA: Diagnosis not present

## 2015-12-01 DIAGNOSIS — I5033 Acute on chronic diastolic (congestive) heart failure: Secondary | ICD-10-CM | POA: Diagnosis not present

## 2015-12-01 DIAGNOSIS — R2689 Other abnormalities of gait and mobility: Secondary | ICD-10-CM | POA: Diagnosis not present

## 2015-12-01 DIAGNOSIS — R1912 Hyperactive bowel sounds: Secondary | ICD-10-CM

## 2015-12-01 DIAGNOSIS — N186 End stage renal disease: Secondary | ICD-10-CM | POA: Diagnosis not present

## 2015-12-01 DIAGNOSIS — J811 Chronic pulmonary edema: Secondary | ICD-10-CM | POA: Diagnosis not present

## 2015-12-01 DIAGNOSIS — R198 Other specified symptoms and signs involving the digestive system and abdomen: Secondary | ICD-10-CM | POA: Insufficient documentation

## 2015-12-01 DIAGNOSIS — R634 Abnormal weight loss: Secondary | ICD-10-CM

## 2015-12-01 DIAGNOSIS — Z9181 History of falling: Secondary | ICD-10-CM | POA: Diagnosis not present

## 2015-12-01 LAB — COMPREHENSIVE METABOLIC PANEL
ALBUMIN: 4.1 g/dL (ref 3.6–5.1)
ALK PHOS: 62 U/L (ref 33–130)
ALT: 5 U/L — AB (ref 6–29)
AST: 11 U/L (ref 10–35)
BUN: 52 mg/dL — AB (ref 7–25)
CHLORIDE: 98 mmol/L (ref 98–110)
CO2: 22 mmol/L (ref 20–31)
CREATININE: 7.15 mg/dL — AB (ref 0.50–0.99)
Calcium: 8 mg/dL — ABNORMAL LOW (ref 8.6–10.4)
Glucose, Bld: 89 mg/dL (ref 65–99)
POTASSIUM: 5 mmol/L (ref 3.5–5.3)
SODIUM: 137 mmol/L (ref 135–146)
TOTAL PROTEIN: 7.3 g/dL (ref 6.1–8.1)
Total Bilirubin: 0.4 mg/dL (ref 0.2–1.2)

## 2015-12-01 LAB — CBC WITH DIFFERENTIAL/PLATELET
BASOS ABS: 46 {cells}/uL (ref 0–200)
BASOS PCT: 1 %
Eosinophils Absolute: 138 cells/uL (ref 15–500)
Eosinophils Relative: 3 %
HCT: 40.7 % (ref 35.0–45.0)
HEMOGLOBIN: 12.5 g/dL (ref 11.7–15.5)
Lymphocytes Relative: 37 %
Lymphs Abs: 1702 cells/uL (ref 850–3900)
MCH: 25.5 pg — AB (ref 27.0–33.0)
MCHC: 30.7 g/dL — ABNORMAL LOW (ref 32.0–36.0)
MCV: 82.9 fL (ref 80.0–100.0)
MPV: 8.9 fL (ref 7.5–12.5)
Monocytes Absolute: 506 cells/uL (ref 200–950)
Monocytes Relative: 11 %
Neutro Abs: 2208 cells/uL (ref 1500–7800)
Neutrophils Relative %: 48 %
PLATELETS: 196 10*3/uL (ref 140–400)
RBC: 4.91 MIL/uL (ref 3.80–5.10)
RDW: 18.9 % — ABNORMAL HIGH (ref 11.0–15.0)
WBC: 4.6 10*3/uL (ref 3.8–10.8)

## 2015-12-01 LAB — LIPASE: LIPASE: 48 U/L (ref 7–60)

## 2015-12-01 LAB — TSH: TSH: 1.25 m[IU]/L

## 2015-12-01 LAB — SEDIMENTATION RATE: SED RATE: 4 mm/h (ref 0–30)

## 2015-12-01 NOTE — Progress Notes (Signed)
Primary Care Physician: [name], MD  Primary Gastroenterologist:  Barney Drain, MD   Chief Complaint  Patient presents with  . Abdominal Pain    HPI: Sonya [name] is a 65 y.o. female here For further evaluation of abdominal discomfort. She was last seen in October 2016 at time of EGD for dysphagia and dyspepsia. Stricture found at the GE junction, status post dilation. Reactive gastropathy noted without H. pylori.  Patient presents with complaints of growling noises after she goes to bed for the past 4 weeks. No symptoms during the day. No associated pain. No new medications except for fold acid. Started this after the growling symptoms have not been occurring. Bowel function remains normal. No blood in the stool or melena. Appetite is good. Denies vomiting.  Her weight has been up and down over the past 6 months. Anywhere from 137-165, down to 127 today.    Current Outpatient Prescriptions  Medication Sig Dispense Refill  . alendronate (FOSAMAX) 70 MG tablet Take 70 mg by mouth once a week. Take with a full glass of water on an empty stomach.    Marland Kitchen allopurinol (ZYLOPRIM) 300 MG tablet Take 0.5 tablets (150 mg total) by mouth daily. Dose was decreased secondary to renal failure.    . cloNIDine (CATAPRES - DOSED IN MG/24 HR) 0.3 mg/24hr patch Place 1 patch (0.3 mg total) onto the skin once a week.    . doxazosin (CARDURA) 8 MG tablet Take 1 tablet (8 mg total) by mouth daily. 30 tablet 0  . hydrALAZINE (APRESOLINE) 100 MG tablet Take 1 tablet (100 mg total) by mouth every 6 (six) hours.    Marland Kitchen HYDROcodone-acetaminophen (NORCO) 7.5-325 MG tablet Take 1-2 tablets by mouth every 6 (six) hours as needed for moderate pain. (Patient taking differently: Take 1 tablet by mouth every 6 (six) hours as needed for moderate pain. ) 90 tablet 0  . labetalol (NORMODYNE) 200 MG tablet Take 600 mg by mouth 2 (two) times daily.    Marland Kitchen levothyroxine (SYNTHROID, LEVOTHROID) 50 MCG tablet Take 1  tablet (50 mcg total) by mouth daily. 30 tablet 0  . lidocaine-prilocaine (EMLA) cream Apply 1 application topically every Tuesday, Thursday, and Saturday at 6 PM.     . lisinopril (PRINIVIL,ZESTRIL) 40 MG tablet Take 1 tablet (40 mg total) by mouth daily.    Marland Kitchen LORazepam (ATIVAN) 0.5 MG tablet Take 1-2 tablets (0.5-1 mg total) by mouth every 6 (six) hours as needed for anxiety or sleep (START WITH 0.5 FIRST; WHEN NECESSARY FOR VERTIGO/DIZZINESS; also prn anxiety and sleep). (Patient taking differently: Take 0.5 mg by mouth 3 (three) times daily. ) 30 tablet 0  . meclizine (ANTIVERT) 25 MG tablet Take 1 tablet by mouth every 6 (six) hours as needed for dizziness. dizziness    . metoCLOPramide (REGLAN) 5 MG tablet 1 po before breakfast and lunch and at bedtime. (Patient taking differently: Take 5 mg by mouth 4 (four) times daily -  before meals and at bedtime. ) 90 tablet 11  . NIFEdipine (PROCARDIA XL/ADALAT-CC) 90 MG 24 hr tablet Take 90 mg by mouth daily.    Marland Kitchen omeprazole (PRILOSEC) 20 MG capsule TAKE ONE CAPSULE BY MOUTH ONCE DAILY 31 capsule 5  . senna-docusate (SENOKOT-S) 8.6-50 MG tablet Take 1 tablet by mouth 2 (two) times daily.     . sevelamer carbonate (RENVELA) 800 MG tablet Take 2,400 mg by mouth 3 (three) times daily with meals.  No current facility-administered medications for this visit.    Allergies as of 12/01/2015 - Review Complete 12/01/2015  Allergen Reaction Noted  . Cephalexin Itching 04/08/2011    ROS:  General: Negative for anorexia,   fever, chills, fatigue, weakness.See history of present illness ENT: Negative for hoarseness, difficulty swallowing , nasal congestion. CV: Negative for chest pain, angina, palpitations, dyspnea on exertion, peripheral edema.  Respiratory: Negative for dyspnea at rest, dyspnea on exertion, cough, sputum, wheezing.  GI: See history of present illness. GU:  Negative for dysuria, hematuria, urinary incontinence, urinary frequency,  nocturnal urination.  Endo: See history of present illness   Physical Examination:   BP 115/70 mmHg  Pulse 65  Temp(Src) 97.5 F (36.4 C) (Oral)  Ht 5\' 7"  (1.702 m)  Wt 127 lb (57.607 kg)  BMI 19.89 kg/m2  General: Well-nourished, well-developed in no acute distress.  Eyes: No icterus. Mouth: Oropharyngeal mucosa moist and pink , no lesions erythema or exudate. Lungs: Clear to auscultation bilaterally.  Heart: Regular rate and rhythm, no murmurs rubs or gallops.  Abdomen: Bowel sounds are normal, nontender, nondistended, no hepatosplenomegaly or masses, no abdominal bruits or hernia , no rebound or guarding.   Extremities: No lower extremity edema. No clubbing or deformities. Neuro: Alert and oriented x 4   Skin: Warm and dry, no jaundice.   Psych: Alert and cooperative, normal mood and affect.  Labs:  Lab Results  Component Value Date   CREATININE 2.99* 09/11/2015   BUN 19 09/11/2015   NA 138 09/11/2015   K 3.8 09/11/2015   CL 99* 09/11/2015   CO2 30 09/11/2015   Lab Results  Component Value Date   ALT 8* 08/20/2015   AST 15 08/20/2015   ALKPHOS 29* 08/20/2015   BILITOT 0.7 08/20/2015   Lab Results  Component Value Date   LIPASE 32 04/14/2015   Lab Results  Component Value Date   WBC 3.3* 09/11/2015   HGB 10.5* 09/11/2015   HCT 34.1* 09/11/2015   MCV 77.3* 09/11/2015   PLT 158 09/11/2015    Imaging Studies: No results found.

## 2015-12-01 NOTE — Patient Instructions (Signed)
1. Please have your labs and CT scan done.

## 2015-12-02 DIAGNOSIS — N186 End stage renal disease: Secondary | ICD-10-CM | POA: Diagnosis not present

## 2015-12-02 DIAGNOSIS — D509 Iron deficiency anemia, unspecified: Secondary | ICD-10-CM | POA: Diagnosis not present

## 2015-12-02 DIAGNOSIS — D631 Anemia in chronic kidney disease: Secondary | ICD-10-CM | POA: Diagnosis not present

## 2015-12-02 DIAGNOSIS — N2581 Secondary hyperparathyroidism of renal origin: Secondary | ICD-10-CM | POA: Diagnosis not present

## 2015-12-02 DIAGNOSIS — Z992 Dependence on renal dialysis: Secondary | ICD-10-CM | POA: Diagnosis not present

## 2015-12-03 ENCOUNTER — Encounter: Payer: Self-pay | Admitting: Gastroenterology

## 2015-12-03 DIAGNOSIS — R2689 Other abnormalities of gait and mobility: Secondary | ICD-10-CM | POA: Diagnosis not present

## 2015-12-03 DIAGNOSIS — I5033 Acute on chronic diastolic (congestive) heart failure: Secondary | ICD-10-CM | POA: Diagnosis not present

## 2015-12-03 DIAGNOSIS — I132 Hypertensive heart and chronic kidney disease with heart failure and with stage 5 chronic kidney disease, or end stage renal disease: Secondary | ICD-10-CM | POA: Diagnosis not present

## 2015-12-03 DIAGNOSIS — Z9181 History of falling: Secondary | ICD-10-CM | POA: Diagnosis not present

## 2015-12-03 DIAGNOSIS — J811 Chronic pulmonary edema: Secondary | ICD-10-CM | POA: Diagnosis not present

## 2015-12-03 DIAGNOSIS — N186 End stage renal disease: Secondary | ICD-10-CM | POA: Diagnosis not present

## 2015-12-03 NOTE — Progress Notes (Signed)
cc'ed to pcp °

## 2015-12-03 NOTE — Assessment & Plan Note (Signed)
65 year old female with history of end-stage renal disease on hemodialysis, iron deficiency anemia with extensive evaluation in 2015 from a GI standpoint, who presents with vague 4 week history of nocturnal stomach growling. No associated pain, change in bowel habits, appetite concerns. Notable weight loss of 20 pounds however. EGD October 2016 with reactive gastropathy, esophageal stricture. Recommend labs. CT abdomen/pelvis for weight loss.

## 2015-12-03 NOTE — Progress Notes (Signed)
Quick Note:  Please let patient know her thyroid medication dose is appropriate based on labs, hemoglobin normal, other labs unremarkable in the setting of end-stage renal disease. Proceed with CT scan as scheduled. ______

## 2015-12-04 DIAGNOSIS — D631 Anemia in chronic kidney disease: Secondary | ICD-10-CM | POA: Diagnosis not present

## 2015-12-04 DIAGNOSIS — Z992 Dependence on renal dialysis: Secondary | ICD-10-CM | POA: Diagnosis not present

## 2015-12-04 DIAGNOSIS — D509 Iron deficiency anemia, unspecified: Secondary | ICD-10-CM | POA: Diagnosis not present

## 2015-12-04 DIAGNOSIS — N186 End stage renal disease: Secondary | ICD-10-CM | POA: Diagnosis not present

## 2015-12-04 DIAGNOSIS — N2581 Secondary hyperparathyroidism of renal origin: Secondary | ICD-10-CM | POA: Diagnosis not present

## 2015-12-04 NOTE — Progress Notes (Signed)
Quick Note:  Tried to call and line was busy. ______

## 2015-12-05 ENCOUNTER — Other Ambulatory Visit: Payer: Self-pay | Admitting: Gastroenterology

## 2015-12-05 ENCOUNTER — Ambulatory Visit (HOSPITAL_COMMUNITY)
Admission: RE | Admit: 2015-12-05 | Discharge: 2015-12-05 | Disposition: A | Discharge status: Home / Self Care | Payer: Medicare Other | Source: Ambulatory Visit | Attending: Gastroenterology | Admitting: Gastroenterology

## 2015-12-05 DIAGNOSIS — R634 Abnormal weight loss: Secondary | ICD-10-CM | POA: Diagnosis not present

## 2015-12-05 DIAGNOSIS — R1912 Hyperactive bowel sounds: Secondary | ICD-10-CM | POA: Insufficient documentation

## 2015-12-05 DIAGNOSIS — K429 Umbilical hernia without obstruction or gangrene: Secondary | ICD-10-CM | POA: Diagnosis not present

## 2015-12-05 DIAGNOSIS — K5730 Diverticulosis of large intestine without perforation or abscess without bleeding: Secondary | ICD-10-CM | POA: Insufficient documentation

## 2015-12-05 DIAGNOSIS — I2510 Atherosclerotic heart disease of native coronary artery without angina pectoris: Secondary | ICD-10-CM | POA: Insufficient documentation

## 2015-12-05 DIAGNOSIS — R198 Other specified symptoms and signs involving the digestive system and abdomen: Secondary | ICD-10-CM

## 2015-12-05 MED ORDER — IOPAMIDOL (ISOVUE-300) INJECTION 61%: 100.0000 mL | Freq: Once | INTRAVENOUS | Status: DC | PRN

## 2015-12-06 DIAGNOSIS — N2581 Secondary hyperparathyroidism of renal origin: Secondary | ICD-10-CM | POA: Diagnosis not present

## 2015-12-06 DIAGNOSIS — D509 Iron deficiency anemia, unspecified: Secondary | ICD-10-CM | POA: Diagnosis not present

## 2015-12-06 DIAGNOSIS — N186 End stage renal disease: Secondary | ICD-10-CM | POA: Diagnosis not present

## 2015-12-06 DIAGNOSIS — Z992 Dependence on renal dialysis: Secondary | ICD-10-CM | POA: Diagnosis not present

## 2015-12-06 DIAGNOSIS — D631 Anemia in chronic kidney disease: Secondary | ICD-10-CM | POA: Diagnosis not present

## 2015-12-08 DIAGNOSIS — N186 End stage renal disease: Secondary | ICD-10-CM | POA: Diagnosis not present

## 2015-12-08 DIAGNOSIS — Z9181 History of falling: Secondary | ICD-10-CM | POA: Diagnosis not present

## 2015-12-08 DIAGNOSIS — J811 Chronic pulmonary edema: Secondary | ICD-10-CM | POA: Diagnosis not present

## 2015-12-08 DIAGNOSIS — I132 Hypertensive heart and chronic kidney disease with heart failure and with stage 5 chronic kidney disease, or end stage renal disease: Secondary | ICD-10-CM | POA: Diagnosis not present

## 2015-12-08 DIAGNOSIS — R2689 Other abnormalities of gait and mobility: Secondary | ICD-10-CM | POA: Diagnosis not present

## 2015-12-08 DIAGNOSIS — I5033 Acute on chronic diastolic (congestive) heart failure: Secondary | ICD-10-CM | POA: Diagnosis not present

## 2015-12-08 NOTE — Progress Notes (Signed)
Quick Note:  Pt is aware and said the Ct was done last week. ______

## 2015-12-09 DIAGNOSIS — Z992 Dependence on renal dialysis: Secondary | ICD-10-CM | POA: Diagnosis not present

## 2015-12-09 DIAGNOSIS — N186 End stage renal disease: Secondary | ICD-10-CM | POA: Diagnosis not present

## 2015-12-09 DIAGNOSIS — D631 Anemia in chronic kidney disease: Secondary | ICD-10-CM | POA: Diagnosis not present

## 2015-12-09 DIAGNOSIS — N2581 Secondary hyperparathyroidism of renal origin: Secondary | ICD-10-CM | POA: Diagnosis not present

## 2015-12-09 DIAGNOSIS — D509 Iron deficiency anemia, unspecified: Secondary | ICD-10-CM | POA: Diagnosis not present

## 2015-12-11 ENCOUNTER — Ambulatory Visit: Payer: Medicare Other | Admitting: Gastroenterology

## 2015-12-11 DIAGNOSIS — D631 Anemia in chronic kidney disease: Secondary | ICD-10-CM | POA: Diagnosis not present

## 2015-12-11 DIAGNOSIS — N186 End stage renal disease: Secondary | ICD-10-CM | POA: Diagnosis not present

## 2015-12-11 DIAGNOSIS — Z992 Dependence on renal dialysis: Secondary | ICD-10-CM | POA: Diagnosis not present

## 2015-12-11 DIAGNOSIS — D509 Iron deficiency anemia, unspecified: Secondary | ICD-10-CM | POA: Diagnosis not present

## 2015-12-11 DIAGNOSIS — N2581 Secondary hyperparathyroidism of renal origin: Secondary | ICD-10-CM | POA: Diagnosis not present

## 2015-12-12 DIAGNOSIS — N186 End stage renal disease: Secondary | ICD-10-CM | POA: Diagnosis not present

## 2015-12-12 DIAGNOSIS — I132 Hypertensive heart and chronic kidney disease with heart failure and with stage 5 chronic kidney disease, or end stage renal disease: Secondary | ICD-10-CM | POA: Diagnosis not present

## 2015-12-12 DIAGNOSIS — R2689 Other abnormalities of gait and mobility: Secondary | ICD-10-CM | POA: Diagnosis not present

## 2015-12-12 DIAGNOSIS — Z9181 History of falling: Secondary | ICD-10-CM | POA: Diagnosis not present

## 2015-12-12 DIAGNOSIS — J811 Chronic pulmonary edema: Secondary | ICD-10-CM | POA: Diagnosis not present

## 2015-12-12 DIAGNOSIS — I5033 Acute on chronic diastolic (congestive) heart failure: Secondary | ICD-10-CM | POA: Diagnosis not present

## 2015-12-13 DIAGNOSIS — Z992 Dependence on renal dialysis: Secondary | ICD-10-CM | POA: Diagnosis not present

## 2015-12-13 DIAGNOSIS — N2581 Secondary hyperparathyroidism of renal origin: Secondary | ICD-10-CM | POA: Diagnosis not present

## 2015-12-13 DIAGNOSIS — D509 Iron deficiency anemia, unspecified: Secondary | ICD-10-CM | POA: Diagnosis not present

## 2015-12-13 DIAGNOSIS — N186 End stage renal disease: Secondary | ICD-10-CM | POA: Diagnosis not present

## 2015-12-13 DIAGNOSIS — D631 Anemia in chronic kidney disease: Secondary | ICD-10-CM | POA: Diagnosis not present

## 2015-12-15 DIAGNOSIS — I5033 Acute on chronic diastolic (congestive) heart failure: Secondary | ICD-10-CM | POA: Diagnosis not present

## 2015-12-15 DIAGNOSIS — I132 Hypertensive heart and chronic kidney disease with heart failure and with stage 5 chronic kidney disease, or end stage renal disease: Secondary | ICD-10-CM | POA: Diagnosis not present

## 2015-12-15 DIAGNOSIS — Z9181 History of falling: Secondary | ICD-10-CM | POA: Diagnosis not present

## 2015-12-15 DIAGNOSIS — N186 End stage renal disease: Secondary | ICD-10-CM | POA: Diagnosis not present

## 2015-12-15 DIAGNOSIS — J811 Chronic pulmonary edema: Secondary | ICD-10-CM | POA: Diagnosis not present

## 2015-12-15 DIAGNOSIS — R2689 Other abnormalities of gait and mobility: Secondary | ICD-10-CM | POA: Diagnosis not present

## 2015-12-15 NOTE — Progress Notes (Signed)
Quick Note:  PT is aware of results and said she never received radiation treatments for uterine cancer. ______

## 2015-12-15 NOTE — Progress Notes (Signed)
Quick Note:  Please let patient know that she had some mild wall thickening involving the mid to lower rectum which is nonspecific. Her last colonoscopy was in 2015, no appreciated abnormalities in the rectum other than hemorrhoids. Please find out if the patient has never received radiation treatment for her uterine cancer. This sometimes can cause similar findings of the rectal wall on CT. ______

## 2015-12-16 DIAGNOSIS — D509 Iron deficiency anemia, unspecified: Secondary | ICD-10-CM | POA: Diagnosis not present

## 2015-12-16 DIAGNOSIS — N2581 Secondary hyperparathyroidism of renal origin: Secondary | ICD-10-CM | POA: Diagnosis not present

## 2015-12-16 DIAGNOSIS — N186 End stage renal disease: Secondary | ICD-10-CM | POA: Diagnosis not present

## 2015-12-16 DIAGNOSIS — Z992 Dependence on renal dialysis: Secondary | ICD-10-CM | POA: Diagnosis not present

## 2015-12-16 DIAGNOSIS — D631 Anemia in chronic kidney disease: Secondary | ICD-10-CM | POA: Diagnosis not present

## 2015-12-18 DIAGNOSIS — D509 Iron deficiency anemia, unspecified: Secondary | ICD-10-CM | POA: Diagnosis not present

## 2015-12-18 DIAGNOSIS — N186 End stage renal disease: Secondary | ICD-10-CM | POA: Diagnosis not present

## 2015-12-18 DIAGNOSIS — Z992 Dependence on renal dialysis: Secondary | ICD-10-CM | POA: Diagnosis not present

## 2015-12-18 DIAGNOSIS — N2581 Secondary hyperparathyroidism of renal origin: Secondary | ICD-10-CM | POA: Diagnosis not present

## 2015-12-18 DIAGNOSIS — D631 Anemia in chronic kidney disease: Secondary | ICD-10-CM | POA: Diagnosis not present

## 2015-12-19 DIAGNOSIS — Z9181 History of falling: Secondary | ICD-10-CM | POA: Diagnosis not present

## 2015-12-19 DIAGNOSIS — I132 Hypertensive heart and chronic kidney disease with heart failure and with stage 5 chronic kidney disease, or end stage renal disease: Secondary | ICD-10-CM | POA: Diagnosis not present

## 2015-12-19 DIAGNOSIS — R2689 Other abnormalities of gait and mobility: Secondary | ICD-10-CM | POA: Diagnosis not present

## 2015-12-19 DIAGNOSIS — J811 Chronic pulmonary edema: Secondary | ICD-10-CM | POA: Diagnosis not present

## 2015-12-19 DIAGNOSIS — N186 End stage renal disease: Secondary | ICD-10-CM | POA: Diagnosis not present

## 2015-12-19 DIAGNOSIS — I5033 Acute on chronic diastolic (congestive) heart failure: Secondary | ICD-10-CM | POA: Diagnosis not present

## 2015-12-19 NOTE — Progress Notes (Signed)
Quick Note:  Dr. Oneida Alar: patient presented with "stomach growling" complaints, but noted unintentional 20 pound weight loss. CT with circumferential rectal wall thickening. Patient has h/o uterine cancer remotely but never received radiation therapy. Last TCS 2015.  Should we due complete TCS or flex sig? ______

## 2015-12-20 DIAGNOSIS — N186 End stage renal disease: Secondary | ICD-10-CM | POA: Diagnosis not present

## 2015-12-20 DIAGNOSIS — D631 Anemia in chronic kidney disease: Secondary | ICD-10-CM | POA: Diagnosis not present

## 2015-12-20 DIAGNOSIS — D509 Iron deficiency anemia, unspecified: Secondary | ICD-10-CM | POA: Diagnosis not present

## 2015-12-20 DIAGNOSIS — Z992 Dependence on renal dialysis: Secondary | ICD-10-CM | POA: Diagnosis not present

## 2015-12-20 DIAGNOSIS — N2581 Secondary hyperparathyroidism of renal origin: Secondary | ICD-10-CM | POA: Diagnosis not present

## 2015-12-23 DIAGNOSIS — N2581 Secondary hyperparathyroidism of renal origin: Secondary | ICD-10-CM | POA: Diagnosis not present

## 2015-12-23 DIAGNOSIS — D631 Anemia in chronic kidney disease: Secondary | ICD-10-CM | POA: Diagnosis not present

## 2015-12-23 DIAGNOSIS — D509 Iron deficiency anemia, unspecified: Secondary | ICD-10-CM | POA: Diagnosis not present

## 2015-12-23 DIAGNOSIS — N186 End stage renal disease: Secondary | ICD-10-CM | POA: Diagnosis not present

## 2015-12-23 DIAGNOSIS — Z992 Dependence on renal dialysis: Secondary | ICD-10-CM | POA: Diagnosis not present

## 2015-12-25 DIAGNOSIS — D631 Anemia in chronic kidney disease: Secondary | ICD-10-CM | POA: Diagnosis not present

## 2015-12-25 DIAGNOSIS — N186 End stage renal disease: Secondary | ICD-10-CM | POA: Diagnosis not present

## 2015-12-25 DIAGNOSIS — D509 Iron deficiency anemia, unspecified: Secondary | ICD-10-CM | POA: Diagnosis not present

## 2015-12-25 DIAGNOSIS — Z992 Dependence on renal dialysis: Secondary | ICD-10-CM | POA: Diagnosis not present

## 2015-12-25 DIAGNOSIS — N2581 Secondary hyperparathyroidism of renal origin: Secondary | ICD-10-CM | POA: Diagnosis not present

## 2015-12-26 DIAGNOSIS — N186 End stage renal disease: Secondary | ICD-10-CM | POA: Diagnosis not present

## 2015-12-26 DIAGNOSIS — I132 Hypertensive heart and chronic kidney disease with heart failure and with stage 5 chronic kidney disease, or end stage renal disease: Secondary | ICD-10-CM | POA: Diagnosis not present

## 2015-12-26 DIAGNOSIS — R2689 Other abnormalities of gait and mobility: Secondary | ICD-10-CM | POA: Diagnosis not present

## 2015-12-26 DIAGNOSIS — Z9181 History of falling: Secondary | ICD-10-CM | POA: Diagnosis not present

## 2015-12-26 DIAGNOSIS — I5033 Acute on chronic diastolic (congestive) heart failure: Secondary | ICD-10-CM | POA: Diagnosis not present

## 2015-12-26 DIAGNOSIS — J811 Chronic pulmonary edema: Secondary | ICD-10-CM | POA: Diagnosis not present

## 2015-12-27 DIAGNOSIS — D631 Anemia in chronic kidney disease: Secondary | ICD-10-CM | POA: Diagnosis not present

## 2015-12-27 DIAGNOSIS — N2581 Secondary hyperparathyroidism of renal origin: Secondary | ICD-10-CM | POA: Diagnosis not present

## 2015-12-27 DIAGNOSIS — Z992 Dependence on renal dialysis: Secondary | ICD-10-CM | POA: Diagnosis not present

## 2015-12-27 DIAGNOSIS — D509 Iron deficiency anemia, unspecified: Secondary | ICD-10-CM | POA: Diagnosis not present

## 2015-12-27 DIAGNOSIS — N186 End stage renal disease: Secondary | ICD-10-CM | POA: Diagnosis not present

## 2015-12-29 DIAGNOSIS — N186 End stage renal disease: Secondary | ICD-10-CM | POA: Diagnosis not present

## 2015-12-29 DIAGNOSIS — I5033 Acute on chronic diastolic (congestive) heart failure: Secondary | ICD-10-CM | POA: Diagnosis not present

## 2015-12-29 DIAGNOSIS — R2689 Other abnormalities of gait and mobility: Secondary | ICD-10-CM | POA: Diagnosis not present

## 2015-12-29 DIAGNOSIS — Z9181 History of falling: Secondary | ICD-10-CM | POA: Diagnosis not present

## 2015-12-29 DIAGNOSIS — J811 Chronic pulmonary edema: Secondary | ICD-10-CM | POA: Diagnosis not present

## 2015-12-29 DIAGNOSIS — I132 Hypertensive heart and chronic kidney disease with heart failure and with stage 5 chronic kidney disease, or end stage renal disease: Secondary | ICD-10-CM | POA: Diagnosis not present

## 2015-12-30 DIAGNOSIS — Z992 Dependence on renal dialysis: Secondary | ICD-10-CM | POA: Diagnosis not present

## 2015-12-30 DIAGNOSIS — N186 End stage renal disease: Secondary | ICD-10-CM | POA: Diagnosis not present

## 2015-12-30 DIAGNOSIS — N2581 Secondary hyperparathyroidism of renal origin: Secondary | ICD-10-CM | POA: Diagnosis not present

## 2015-12-30 DIAGNOSIS — D631 Anemia in chronic kidney disease: Secondary | ICD-10-CM | POA: Diagnosis not present

## 2015-12-30 DIAGNOSIS — D509 Iron deficiency anemia, unspecified: Secondary | ICD-10-CM | POA: Diagnosis not present

## 2015-12-31 DIAGNOSIS — I132 Hypertensive heart and chronic kidney disease with heart failure and with stage 5 chronic kidney disease, or end stage renal disease: Secondary | ICD-10-CM | POA: Diagnosis not present

## 2015-12-31 DIAGNOSIS — Z992 Dependence on renal dialysis: Secondary | ICD-10-CM | POA: Diagnosis not present

## 2015-12-31 DIAGNOSIS — N186 End stage renal disease: Secondary | ICD-10-CM | POA: Diagnosis not present

## 2015-12-31 DIAGNOSIS — Z9181 History of falling: Secondary | ICD-10-CM | POA: Diagnosis not present

## 2015-12-31 DIAGNOSIS — R2689 Other abnormalities of gait and mobility: Secondary | ICD-10-CM | POA: Diagnosis not present

## 2015-12-31 DIAGNOSIS — J811 Chronic pulmonary edema: Secondary | ICD-10-CM | POA: Diagnosis not present

## 2015-12-31 DIAGNOSIS — I5033 Acute on chronic diastolic (congestive) heart failure: Secondary | ICD-10-CM | POA: Diagnosis not present

## 2016-01-01 ENCOUNTER — Other Ambulatory Visit: Payer: Self-pay

## 2016-01-01 DIAGNOSIS — N186 End stage renal disease: Secondary | ICD-10-CM | POA: Diagnosis not present

## 2016-01-01 DIAGNOSIS — D631 Anemia in chronic kidney disease: Secondary | ICD-10-CM | POA: Diagnosis not present

## 2016-01-01 DIAGNOSIS — R634 Abnormal weight loss: Secondary | ICD-10-CM

## 2016-01-01 DIAGNOSIS — N2581 Secondary hyperparathyroidism of renal origin: Secondary | ICD-10-CM | POA: Diagnosis not present

## 2016-01-01 DIAGNOSIS — Z992 Dependence on renal dialysis: Secondary | ICD-10-CM | POA: Diagnosis not present

## 2016-01-01 DIAGNOSIS — D509 Iron deficiency anemia, unspecified: Secondary | ICD-10-CM | POA: Diagnosis not present

## 2016-01-01 NOTE — Progress Notes (Signed)
Spoke with pt and she is set for flex sig on 01/01/2016 @ 0915am.

## 2016-01-01 NOTE — Progress Notes (Signed)
REVIEWED-SCHEDULE PT FOR FLEX SIG WITHIN THE NEXT 2 WEEKS MON JUN 5 OR ON JUN 12. MAY TAKE ENEMA IN THE PREOP AREA.

## 2016-01-02 ENCOUNTER — Encounter (HOSPITAL_COMMUNITY)
Admission: RE | Disposition: A | Discharge status: Home / Self Care | Payer: Self-pay | Source: Ambulatory Visit | Attending: Gastroenterology

## 2016-01-02 ENCOUNTER — Encounter (HOSPITAL_COMMUNITY): Payer: Self-pay | Admitting: *Deleted

## 2016-01-02 ENCOUNTER — Ambulatory Visit (HOSPITAL_COMMUNITY)
Admission: RE | Admit: 2016-01-02 | Discharge: 2016-01-02 | Disposition: A | Discharge status: Home / Self Care | Payer: Medicare Other | Source: Ambulatory Visit | Attending: Gastroenterology | Admitting: Gastroenterology

## 2016-01-02 DIAGNOSIS — E039 Hypothyroidism, unspecified: Secondary | ICD-10-CM | POA: Diagnosis not present

## 2016-01-02 DIAGNOSIS — Z8542 Personal history of malignant neoplasm of other parts of uterus: Secondary | ICD-10-CM | POA: Insufficient documentation

## 2016-01-02 DIAGNOSIS — K219 Gastro-esophageal reflux disease without esophagitis: Secondary | ICD-10-CM | POA: Diagnosis not present

## 2016-01-02 DIAGNOSIS — K644 Residual hemorrhoidal skin tags: Secondary | ICD-10-CM | POA: Diagnosis not present

## 2016-01-02 DIAGNOSIS — I120 Hypertensive chronic kidney disease with stage 5 chronic kidney disease or end stage renal disease: Secondary | ICD-10-CM | POA: Diagnosis not present

## 2016-01-02 DIAGNOSIS — F419 Anxiety disorder, unspecified: Secondary | ICD-10-CM | POA: Insufficient documentation

## 2016-01-02 DIAGNOSIS — R634 Abnormal weight loss: Secondary | ICD-10-CM

## 2016-01-02 DIAGNOSIS — Z992 Dependence on renal dialysis: Secondary | ICD-10-CM | POA: Insufficient documentation

## 2016-01-02 DIAGNOSIS — K5730 Diverticulosis of large intestine without perforation or abscess without bleeding: Secondary | ICD-10-CM | POA: Insufficient documentation

## 2016-01-02 DIAGNOSIS — R933 Abnormal findings on diagnostic imaging of other parts of digestive tract: Secondary | ICD-10-CM

## 2016-01-02 DIAGNOSIS — Z881 Allergy status to other antibiotic agents status: Secondary | ICD-10-CM | POA: Diagnosis not present

## 2016-01-02 DIAGNOSIS — N186 End stage renal disease: Secondary | ICD-10-CM | POA: Diagnosis not present

## 2016-01-02 DIAGNOSIS — Z79899 Other long term (current) drug therapy: Secondary | ICD-10-CM | POA: Diagnosis not present

## 2016-01-02 HISTORY — PX: FLEXIBLE SIGMOIDOSCOPY: SHX5431

## 2016-01-02 SURGERY — SIGMOIDOSCOPY, FLEXIBLE
Anesthesia: Moderate Sedation

## 2016-01-02 MED ORDER — HYDRALAZINE HCL 20 MG/ML IJ SOLN
10.0000 mg | Freq: Once | INTRAMUSCULAR | Status: AC
  Administered 2016-01-02: 10 mg via INTRAVENOUS

## 2016-01-02 MED ORDER — FENTANYL CITRATE (PF) 100 MCG/2ML IJ SOLN
INTRAMUSCULAR | Status: DC | PRN
  Administered 2016-01-02: 50 ug via INTRAVENOUS

## 2016-01-02 MED ORDER — MEPERIDINE HCL 100 MG/ML IJ SOLN
INTRAMUSCULAR | Status: AC
  Filled 2016-01-02: qty 2

## 2016-01-02 MED ORDER — STERILE WATER FOR IRRIGATION IR SOLN
Status: DC | PRN
  Administered 2016-01-02: 2.5 mL

## 2016-01-02 MED ORDER — METOCLOPRAMIDE HCL 5 MG PO TABS
ORAL_TABLET | ORAL | Status: DC
Start: 2016-01-02 — End: 2017-07-04

## 2016-01-02 MED ORDER — HYDRALAZINE HCL 20 MG/ML IJ SOLN
INTRAMUSCULAR | Status: AC
  Filled 2016-01-02: qty 1

## 2016-01-02 MED ORDER — FENTANYL CITRATE (PF) 100 MCG/2ML IJ SOLN
INTRAMUSCULAR | Status: AC
  Filled 2016-01-02: qty 2

## 2016-01-02 MED ORDER — MIDAZOLAM HCL 5 MG/5ML IJ SOLN
INTRAMUSCULAR | Status: AC
  Filled 2016-01-02: qty 10

## 2016-01-02 MED ORDER — MIDAZOLAM HCL 5 MG/5ML IJ SOLN
INTRAMUSCULAR | Status: DC | PRN
  Administered 2016-01-02: 2 mg via INTRAVENOUS
  Administered 2016-01-02: 1 mg via INTRAVENOUS

## 2016-01-02 MED ORDER — SODIUM CHLORIDE 0.9 % IV SOLN
INTRAVENOUS | Status: DC
Start: 2016-01-02 — End: 2016-01-02
  Administered 2016-01-02: 09:00:00 via INTRAVENOUS

## 2016-01-02 NOTE — H&P (Signed)
Primary Care Physician:  [name], MD Primary Gastroenterologist:  Dr. Oneida Alar  Pre-Procedure History & Physical: HPI:  Sonya [name] is a 65 y.o. female here for Abnormal CT scan: ABNORMAL RECTUM.  Past Medical History  Diagnosis Date  . HTN (hypertension)   . Hypothyroidism   . Uterine cancer ([place]) 2012  . Anemia   . CKD (chronic kidney disease)   . Dysphagia   . Hyperparathyroidism due to renal insufficiency ([place])   . GERD (gastroesophageal reflux disease)   . Diverticulitis   . Vertical diplopia   . Vertigo   . Hypertensive urgency 06/2013 & 04/2015  . Fracture of femoral neck, left, closed 05/30/2015  . Gastritis 06/2013 & 05/2015  . Gastroesophageal reflux disease with stricture 05/2015  . ESRD (end stage renal disease) ([place])     On HD  . ICH (intracerebral hemorrhage) ([place]) 10/2014  . 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 Protein calorie malnutrition [facility])     Past Surgical History  Procedure Laterality Date  . Cholecystectomy    . Cesarean section    . Laparoscopic total hysterectomy    . Colonoscopy  2000    TICS, IH  . Colonoscopy  2003 NUR BRBPR D50 V6    DC/[facility] TICS, IH  . Abdominal hysterectomy    . Nasal septum surgery    . Esophagogastroduodenoscopy (egd) with esophageal dilation N/A 06/08/2013    Dr. Fields:Stricture at the gastroesophageal junction/MILD NON-erosive gastritis (inflammation) was found in the gastric antrum; multiple biopsies/NO SOURCE FOR ANEMIA IDETIFIED-MOST LIKELY DUE TO ANEMIA OF CHRONIC DISEASE  . Colonoscopy N/A 09/04/2013    SLF: Small ulcer in the descending colon, one colon polyp (tubular adenoma), large internal hemorrhoids, moderate diverticulosis. next tcs 10 years.  . Appendectomy    . Bascilic vein transposition Right 09/17/2013    Procedure: BRACHIAL VEIN TRANSPOSITION;  Surgeon: Conrad Nevada, MD;  Location: [facility];  Service: Vascular;  Laterality: Right;  . Insertion of dialysis catheter Left 09/17/2013    Procedure: INSERTION OF  DIALYSIS CATHETER;  Surgeon: Conrad Tolleson, MD;  Location: [facility];  Service: Vascular;  Laterality: Left;  . Bascilic vein transposition Right 10/29/2013    Procedure: RIGHT SECOND STAGE BRACHIAL VEIN TRANSPOSITION;  Surgeon: Conrad [place], MD;  Location: [facility];  Service: Vascular;  Laterality: Right;  . Givens capsule study N/A 12/03/2013    SLF: occasional gastric erosion. small bowel normal  . Esophagogastroduodenoscopy N/A 05/23/2015    [id number] non-erosive gastritis/stricture at the gastroesophageal junction  . Savory dilation N/A 05/23/2015    Procedure: SAVORY DILATION;  Surgeon: Danie Binder, MD;  [facility];  Service: Endoscopy;  Laterality: N/A;  . Hip pinning,cannulated Left 05/31/2015    Procedure: CANNULATED HIP PINNING;  Surgeon: Leandrew Koyanagi, MD;  Location: [facility];  Service: Orthopedics;  Laterality: Left;    Prior to Admission medications   Medication Sig Start Date End Date Taking? Authorizing Provider  alendronate (FOSAMAX) 70 MG tablet Take 70 mg by mouth once a week. Take with a full glass of water on an empty stomach.   Yes Historical Provider, MD  allopurinol (ZYLOPRIM) 300 MG tablet Take 0.5 tablets (150 mg total) by mouth daily. Dose was decreased secondary to renal failure. 06/16/15  Yes Rexene Alberts, MD  doxazosin (CARDURA) 8 MG tablet Take 1 tablet (8 mg total) by mouth daily. 08/24/15  Yes Samuella Cota, MD  HYDROcodone-acetaminophen ([place]) 7.5-325 MG tablet Take 1-2 tablets by mouth every  6 (six) hours as needed for moderate pain. Patient taking differently: Take 1 tablet by mouth every 6 (six) hours as needed for moderate pain.  05/31/15  Yes Leandrew Koyanagi, MD  labetalol (NORMODYNE) 200 MG tablet Take 600 mg by mouth 2 (two) times daily.   Yes Historical Provider, MD  levothyroxine (SYNTHROID, LEVOTHROID) 50 MCG tablet Take 1 tablet (50 mcg total) by mouth daily. 04/17/15  Yes Samuella Cota, MD  lidocaine-prilocaine (EMLA) cream Apply 1 application  topically every Tuesday, Thursday, and Saturday at 6 PM.    Yes Historical Provider, MD  lisinopril (PRINIVIL,ZESTRIL) 40 MG tablet Take 1 tablet (40 mg total) by mouth daily. 06/16/15  Yes Rexene Alberts, MD  LORazepam (ATIVAN) 0.5 MG tablet Take 1-2 tablets (0.5-1 mg total) by mouth every 6 (six) hours as needed for anxiety or sleep (START WITH 0.5 FIRST; WHEN NECESSARY FOR VERTIGO/DIZZINESS; also prn anxiety and sleep). Patient taking differently: Take 0.5 mg by mouth 3 (three) times daily.  06/16/15  Yes Rexene Alberts, MD  meclizine (ANTIVERT) 25 MG tablet Take 1 tablet by mouth every 6 (six) hours as needed for dizziness. dizziness 11/22/14  Yes Historical Provider, MD  metoCLOPramide (REGLAN) 5 MG tablet 1 po before breakfast and lunch and at bedtime. Patient taking differently: Take 5 mg by mouth 4 (four) times daily -  before meals and at bedtime.  05/13/15  Yes Danie Binder, MD  NIFEdipine (PROCARDIA XL/ADALAT-CC) 90 MG 24 hr tablet Take 90 mg by mouth daily. 04/10/15  Yes Historical Provider, MD  omeprazole (PRILOSEC) 20 MG capsule TAKE ONE CAPSULE BY MOUTH ONCE DAILY 11/05/15  Yes Carlis Stable, NP  senna-docusate (SENOKOT-S) 8.6-50 MG tablet Take 1 tablet by mouth 2 (two) times daily.    Yes Historical Provider, MD  sevelamer carbonate (RENVELA) 800 MG tablet Take 2,400 mg by mouth 3 (three) times daily with meals.    Yes Historical Provider, MD  cloNIDine (CATAPRES - DOSED IN MG/24 HR) 0.3 mg/24hr patch Place 1 patch (0.3 mg total) onto the skin once a week. 06/16/15   Rexene Alberts, MD  hydrALAZINE (APRESOLINE) 100 MG tablet Take 1 tablet (100 mg total) by mouth every 6 (six) hours. 06/04/15   Thurnell Lose, MD    Allergies as of 01/01/2016 - Review Complete 12/01/2015  Allergen Reaction Noted  . Cephalexin Itching 04/08/2011    Family History  Problem Relation Age of Onset  . Colon cancer Neg Hx   . Cancer Mother   . Diabetes Mother   . Hypertension Mother   . Diabetes Father    . Hypertension Father   . Hypertension Sister     Social History   Social History  . Marital Status: Married    Spouse Name: N/A  . Number of Children: N/A  . Years of Education: N/A   Occupational History  . Not on file.   Social History Main Topics  . Smoking status: Never Smoker   . Smokeless tobacco: Never Used  . Alcohol Use: No  . Drug Use: No  . Sexual Activity: Not on file   Other Topics Concern  . 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    Review of Systems: See HPI, otherwise negative ROS   Physical Exam: BP 220/89 mmHg  Pulse 76  Temp(Src) 98.2 F (36.8 C) (Oral)  Resp 16  Ht 5\' 7"  (1.702 m)  Wt 127  lb (57.607 kg)  BMI 19.89 kg/m2  SpO2 100% General:   Alert,  pleasant and cooperative in NAD Head:  Normocephalic and atraumatic. Neck:  Supple; Lungs:  Clear throughout to auscultation.    Heart:  Regular rate and rhythm. Abdomen:  Soft, nontender and nondistended. Normal bowel sounds, without guarding, and without rebound.   Neurologic:  Alert and  oriented x4;  grossly normal neurologically.  Impression/Plan:     Abnormal CT scan: ABNORMAL RECTUM  Plan:  FLEX SIG TODAY

## 2016-01-02 NOTE — Discharge Instructions (Signed)
You have SMALL INTERNAL AND LARGE EXTERNAL hemorrhoids and DIVERTICULOSIS IN YOUR LEFT COLON. YOUR RECTUM IS NORMAL.    ONLY TAKE REGLAN (METOCLOPRAMIDE) BEFORE BREAKFAST.  PLEASE CALL IN 2 WEEKS IF YOUR SYMPTOMS ARE NOT IMPROVED & WE WILL CONSIDER ADDING IMMODIUM OR BENTYL AT BEDTIME.  FOLLOW A HIGH FIBER DIET. AVOID ITEMS THAT CAUSE BLOATING & GAS. SEE INFO BELOW.  FOLLOW UP IN 3 MOS.       ENDOSCOPY Care After Read the instructions outlined below and refer to this sheet in the next week. These discharge instructions provide you with general information on caring for yourself after you leave the hospital. While your treatment has been planned according to the most current medical practices available, unavoidable complications occasionally occur. If you have any problems or questions after discharge, call DR. Makynlie Rossini, [phone].  ACTIVITY  You may resume your regular activity, but move at a slower pace for the next 24 hours.   Take frequent rest periods for the next 24 hours.   Walking will help get rid of the air and reduce the bloated feeling in your belly (abdomen).   No driving for 24 hours (because of the medicine (anesthesia) used during the test).   You may shower.   Do not sign any important legal documents or operate any machinery for 24 hours (because of the anesthesia used during the test).    NUTRITION  Drink plenty of fluids.   You may resume your normal diet as instructed by your doctor.   Begin with a light meal and progress to your normal diet. Heavy or fried foods are harder to digest and may make you feel sick to your stomach (nauseated).   Avoid alcoholic beverages for 24 hours or as instructed.    MEDICATIONS  You may resume your normal medications.   WHAT YOU CAN EXPECT TODAY  Some feelings of bloating in the abdomen.   Passage of more gas than usual.   Spotting of blood in your stool or on the toilet paper  .  IF YOU HADBIOPSIES TAKEN   DURING THE SIGMOIDOSCOPY/UPPER ENDOSCOPY:  Eat a soft diet IF YOU HAVE NAUSEA, BLOATING, ABDOMINAL PAIN, OR VOMITING.    FINDING OUT THE RESULTS OF YOUR TEST Not all test results are available during your visit. DR. Oneida Alar WILL CALL YOU WITHIN 7 DAYS OF YOUR PROCEDUE WITH YOUR RESULTS. Do not assume everything is normal if you have not heard from DR. Lenardo Westwood IN ONE WEEK, CALL HER OFFICE AT [phone].  SEEK IMMEDIATE MEDICAL ATTENTION AND CALL THE OFFICE: [phone] IF:  You have more than a spotting of blood in your stool.   Your belly is swollen (abdominal distention).   You are nauseated or vomiting.   You have a temperature over 101F.   You have abdominal pain or discomfort that is severe or gets worse throughout the day.    High-Fiber Diet A high-fiber diet changes your normal diet to include more whole grains, legumes, fruits, and vegetables. Changes in the diet involve replacing refined carbohydrates with unrefined foods. The calorie level of the diet is essentially unchanged. The Dietary Reference Intake (recommended amount) for adult males is 38 grams per day. For adult females, it is 25 grams per day. Pregnant and lactating women should consume 28 grams of fiber per day. Fiber is the intact part of a plant that is not broken down during digestion. Functional fiber is fiber that has been isolated from the plant to provide a beneficial effect  in the body. PURPOSE  Increase stool bulk.   Ease and regulate bowel movements.   Lower cholesterol.  REDUCE RISK OF COLON CANCER  INDICATIONS THAT YOU NEED MORE FIBER  Constipation and hemorrhoids.   Uncomplicated diverticulosis (intestine condition) and irritable bowel syndrome.   Weight management.   As a protective measure against hardening of the arteries (atherosclerosis), diabetes, and cancer.   GUIDELINES FOR INCREASING FIBER IN THE DIET  Start adding fiber to the diet slowly. A gradual increase of about 5 more  grams (2 slices of whole-wheat bread, 2 servings of most fruits or vegetables, or 1 bowl of high-fiber cereal) per day is best. Too rapid an increase in fiber may result in constipation, flatulence, and bloating.   Drink enough water and fluids to keep your urine clear or pale yellow. Water, juice, or caffeine-free drinks are recommended. Not drinking enough fluid may cause constipation.   Eat a variety of high-fiber foods rather than one type of fiber.   Try to increase your intake of fiber through using high-fiber foods rather than fiber pills or supplements that contain small amounts of fiber.   The goal is to change the types of food eaten. Do not supplement your present diet with high-fiber foods, but replace foods in your present diet.   INCLUDE A VARIETY OF FIBER SOURCES  Replace refined and processed grains with whole grains, canned fruits with fresh fruits, and incorporate other fiber sources. White rice, white breads, and most bakery goods contain little or no fiber.   Brown whole-grain rice, buckwheat oats, and many fruits and vegetables are all good sources of fiber. These include: broccoli, Brussels sprouts, cabbage, cauliflower, beets, sweet potatoes, white potatoes (skin on), carrots, tomatoes, eggplant, squash, berries, fresh fruits, and dried fruits.   Cereals appear to be the richest source of fiber. Cereal fiber is found in whole grains and bran. Bran is the fiber-rich outer coat of cereal grain, which is largely removed in refining. In whole-grain cereals, the bran remains. In breakfast cereals, the largest amount of fiber is found in those with "bran" in their names. The fiber content is sometimes indicated on the label.   You may need to include additional fruits and vegetables each day.   In baking, for 1 cup white flour, you may use the following substitutions:   1 cup whole-wheat flour minus 2 tablespoons.   1/2 cup white flour plus 1/2 cup whole-wheat flour.    Hemorrhoids Hemorrhoids are dilated (enlarged) veins around the rectum. Sometimes clots will form in the veins. This makes them swollen and painful. These are called thrombosed hemorrhoids. Causes of hemorrhoids include:  Constipation.   Straining to have a bowel movement.   HEAVY LIFTING  HOME CARE INSTRUCTIONS  Eat a well balanced diet and drink 6 to 8 glasses of water every day to avoid constipation. You may also use a bulk laxative.   Avoid straining to have bowel movements.   Keep anal area dry and clean.   Do not use a donut shaped pillow or sit on the toilet for long periods. This increases blood pooling and pain.   Move your bowels when your body has the urge; this will require less straining and will decrease pain and pressure.

## 2016-01-02 NOTE — Op Note (Signed)
[facility] Patient Name: Sonya [name] Procedure Date: 01/02/2016 9:32 AM MRN: [id number] Date of Birth: [birth year] Attending MD: Barney Drain , MD CSN: [id number] Age: 65 Admit Type: Outpatient Procedure:                Flexible Sigmoidoscopy Indications:              Abnormal CT of the GI tract: PROXIMAL RECTAL WALL                            THICKENED Providers:                Barney Drain, MD, Lurline Del, RN, Bonnetta Barry,                            Technician Referring MD:             Redmond School, MD Medicines:                Fentanyl 50 micrograms IV, Midazolam 4 mg IV Complications:            No immediate complications. Estimated Blood Loss:     Estimated blood loss: none. Procedure:                Pre-Anesthesia Assessment:                           - Prior to the procedure, a History and Physical                            was performed, and patient medications and                            allergies were reviewed. The patient's tolerance of                            previous anesthesia was also reviewed. The risks                            and benefits of the procedure and the sedation                            options and risks were discussed with the patient.                            All questions were answered, and informed consent                            was obtained. Prior Anticoagulants: The patient has                            taken no previous anticoagulant or antiplatelet                            agents. ASA Grade Assessment: II - A patient with  mild systemic disease. After reviewing the risks                            and benefits, the patient was deemed in                            satisfactory condition to undergo the procedure.                           After obtaining informed consent, the scope was                            passed under direct vision. The [id number]                            was  introduced through the anus and advanced to the                            the sigmoid colon. The flexible sigmoidoscopy was                            accomplished without difficulty. The patient                            tolerated the procedure well. The quality of the                            bowel preparation was adequate. Scope In: 9:52:06 AM Scope Out: 9:58:05 AM Total Procedure Duration: 0 hours 5 minutes 59 seconds  Findings:      The digital rectal exam findings include non-thrombosed external       hemorrhoids.      Many small-mouthed diverticula were found in the sigmoid colon.      Non-bleeding internal hemorrhoids were found. The hemorrhoids were       moderate. Impression:               - Non-thrombosed external hemorrhoids found on                            digital rectal exam.                           - Diverticulosis in the sigmoid colon.                           - Non-bleeding internal hemorrhoids.                           - PROXIMA RECTUM IS NORMAL Moderate Sedation:      Moderate (conscious) sedation was administered by the endoscopy nurse       and supervised by the endoscopist. The following parameters were       monitored: oxygen saturation, heart rate, blood pressure, and response       to care. Total physician intraservice time was 15 minutes. Recommendation:           -  Resume previous diet.                           - Return to my office in 3 months.                           - Patient has a contact number available for                            emergencies. The signs and symptoms of potential                            delayed complications were discussed with the                            patient. Return to normal activities tomorrow.                            Written discharge instructions were provided to the                            patient. Procedure Code(s):        --- Professional ---                           [phone], Sigmoidoscopy,  flexible; diagnostic,                            including collection of specimen(s) by brushing or                            washing, when performed (separate procedure)                           G0500, Moderate sedation services provided by the                            same physician or other qualified health care                            professional performing a gastrointestinal                            endoscopic service that sedation supports,                            requiring the presence of an independent trained                            observer to assist in the monitoring of the                            patient's level of consciousness and physiological  status; initial 15 minutes of intra-service time;                            patient age 60 years or older (additional time may                            be reported with [phone], as appropriate) Diagnosis Code(s):        --- Professional ---                           K64.4, Residual hemorrhoidal skin tags                           K64.8, Other hemorrhoids                           K57.30, Diverticulosis of large intestine without                            perforation or abscess without bleeding                           R93.3, Abnormal findings on diagnostic imaging of                            other parts of digestive tract CPT copyright 2016 [employer]. All rights reserved. The codes documented in this report are preliminary and upon coder review may  be revised to meet current compliance requirements. Barney Drain, MD Barney Drain, MD 01/02/2016 3:55:23 PM This report has been signed electronically. Number of Addenda: 0

## 2016-01-03 DIAGNOSIS — D509 Iron deficiency anemia, unspecified: Secondary | ICD-10-CM | POA: Diagnosis not present

## 2016-01-03 DIAGNOSIS — N2581 Secondary hyperparathyroidism of renal origin: Secondary | ICD-10-CM | POA: Diagnosis not present

## 2016-01-03 DIAGNOSIS — Z992 Dependence on renal dialysis: Secondary | ICD-10-CM | POA: Diagnosis not present

## 2016-01-03 DIAGNOSIS — N186 End stage renal disease: Secondary | ICD-10-CM | POA: Diagnosis not present

## 2016-01-03 DIAGNOSIS — D631 Anemia in chronic kidney disease: Secondary | ICD-10-CM | POA: Diagnosis not present

## 2016-01-05 DIAGNOSIS — J811 Chronic pulmonary edema: Secondary | ICD-10-CM | POA: Diagnosis not present

## 2016-01-05 DIAGNOSIS — I132 Hypertensive heart and chronic kidney disease with heart failure and with stage 5 chronic kidney disease, or end stage renal disease: Secondary | ICD-10-CM | POA: Diagnosis not present

## 2016-01-05 DIAGNOSIS — Z9181 History of falling: Secondary | ICD-10-CM | POA: Diagnosis not present

## 2016-01-05 DIAGNOSIS — N186 End stage renal disease: Secondary | ICD-10-CM | POA: Diagnosis not present

## 2016-01-05 DIAGNOSIS — I5033 Acute on chronic diastolic (congestive) heart failure: Secondary | ICD-10-CM | POA: Diagnosis not present

## 2016-01-05 DIAGNOSIS — R2689 Other abnormalities of gait and mobility: Secondary | ICD-10-CM | POA: Diagnosis not present

## 2016-01-06 DIAGNOSIS — N2581 Secondary hyperparathyroidism of renal origin: Secondary | ICD-10-CM | POA: Diagnosis not present

## 2016-01-06 DIAGNOSIS — D631 Anemia in chronic kidney disease: Secondary | ICD-10-CM | POA: Diagnosis not present

## 2016-01-06 DIAGNOSIS — Z992 Dependence on renal dialysis: Secondary | ICD-10-CM | POA: Diagnosis not present

## 2016-01-06 DIAGNOSIS — N186 End stage renal disease: Secondary | ICD-10-CM | POA: Diagnosis not present

## 2016-01-06 DIAGNOSIS — D509 Iron deficiency anemia, unspecified: Secondary | ICD-10-CM | POA: Diagnosis not present

## 2016-01-07 ENCOUNTER — Encounter (HOSPITAL_COMMUNITY): Payer: Self-pay | Admitting: Gastroenterology

## 2016-01-07 DIAGNOSIS — N186 End stage renal disease: Secondary | ICD-10-CM | POA: Diagnosis not present

## 2016-01-07 DIAGNOSIS — I132 Hypertensive heart and chronic kidney disease with heart failure and with stage 5 chronic kidney disease, or end stage renal disease: Secondary | ICD-10-CM | POA: Diagnosis not present

## 2016-01-07 DIAGNOSIS — J811 Chronic pulmonary edema: Secondary | ICD-10-CM | POA: Diagnosis not present

## 2016-01-07 DIAGNOSIS — I5033 Acute on chronic diastolic (congestive) heart failure: Secondary | ICD-10-CM | POA: Diagnosis not present

## 2016-01-07 DIAGNOSIS — Z9181 History of falling: Secondary | ICD-10-CM | POA: Diagnosis not present

## 2016-01-07 DIAGNOSIS — R2689 Other abnormalities of gait and mobility: Secondary | ICD-10-CM | POA: Diagnosis not present

## 2016-01-08 DIAGNOSIS — N186 End stage renal disease: Secondary | ICD-10-CM | POA: Diagnosis not present

## 2016-01-08 DIAGNOSIS — N2581 Secondary hyperparathyroidism of renal origin: Secondary | ICD-10-CM | POA: Diagnosis not present

## 2016-01-08 DIAGNOSIS — Z992 Dependence on renal dialysis: Secondary | ICD-10-CM | POA: Diagnosis not present

## 2016-01-08 DIAGNOSIS — D509 Iron deficiency anemia, unspecified: Secondary | ICD-10-CM | POA: Diagnosis not present

## 2016-01-08 DIAGNOSIS — D631 Anemia in chronic kidney disease: Secondary | ICD-10-CM | POA: Diagnosis not present

## 2016-01-10 DIAGNOSIS — N2581 Secondary hyperparathyroidism of renal origin: Secondary | ICD-10-CM | POA: Diagnosis not present

## 2016-01-10 DIAGNOSIS — Z992 Dependence on renal dialysis: Secondary | ICD-10-CM | POA: Diagnosis not present

## 2016-01-10 DIAGNOSIS — D509 Iron deficiency anemia, unspecified: Secondary | ICD-10-CM | POA: Diagnosis not present

## 2016-01-10 DIAGNOSIS — N186 End stage renal disease: Secondary | ICD-10-CM | POA: Diagnosis not present

## 2016-01-10 DIAGNOSIS — D631 Anemia in chronic kidney disease: Secondary | ICD-10-CM | POA: Diagnosis not present

## 2016-01-12 DIAGNOSIS — I132 Hypertensive heart and chronic kidney disease with heart failure and with stage 5 chronic kidney disease, or end stage renal disease: Secondary | ICD-10-CM | POA: Diagnosis not present

## 2016-01-12 DIAGNOSIS — I5033 Acute on chronic diastolic (congestive) heart failure: Secondary | ICD-10-CM | POA: Diagnosis not present

## 2016-01-12 DIAGNOSIS — R2689 Other abnormalities of gait and mobility: Secondary | ICD-10-CM | POA: Diagnosis not present

## 2016-01-12 DIAGNOSIS — J811 Chronic pulmonary edema: Secondary | ICD-10-CM | POA: Diagnosis not present

## 2016-01-12 DIAGNOSIS — N186 End stage renal disease: Secondary | ICD-10-CM | POA: Diagnosis not present

## 2016-01-12 DIAGNOSIS — Z9181 History of falling: Secondary | ICD-10-CM | POA: Diagnosis not present

## 2016-01-13 DIAGNOSIS — D631 Anemia in chronic kidney disease: Secondary | ICD-10-CM | POA: Diagnosis not present

## 2016-01-13 DIAGNOSIS — I5033 Acute on chronic diastolic (congestive) heart failure: Secondary | ICD-10-CM | POA: Diagnosis not present

## 2016-01-13 DIAGNOSIS — R2689 Other abnormalities of gait and mobility: Secondary | ICD-10-CM | POA: Diagnosis not present

## 2016-01-13 DIAGNOSIS — I132 Hypertensive heart and chronic kidney disease with heart failure and with stage 5 chronic kidney disease, or end stage renal disease: Secondary | ICD-10-CM | POA: Diagnosis not present

## 2016-01-13 DIAGNOSIS — Z992 Dependence on renal dialysis: Secondary | ICD-10-CM | POA: Diagnosis not present

## 2016-01-13 DIAGNOSIS — D509 Iron deficiency anemia, unspecified: Secondary | ICD-10-CM | POA: Diagnosis not present

## 2016-01-13 DIAGNOSIS — N186 End stage renal disease: Secondary | ICD-10-CM | POA: Diagnosis not present

## 2016-01-13 DIAGNOSIS — J811 Chronic pulmonary edema: Secondary | ICD-10-CM | POA: Diagnosis not present

## 2016-01-13 DIAGNOSIS — Z9181 History of falling: Secondary | ICD-10-CM | POA: Diagnosis not present

## 2016-01-13 DIAGNOSIS — N2581 Secondary hyperparathyroidism of renal origin: Secondary | ICD-10-CM | POA: Diagnosis not present

## 2016-01-15 DIAGNOSIS — N186 End stage renal disease: Secondary | ICD-10-CM | POA: Diagnosis not present

## 2016-01-15 DIAGNOSIS — D631 Anemia in chronic kidney disease: Secondary | ICD-10-CM | POA: Diagnosis not present

## 2016-01-15 DIAGNOSIS — Z992 Dependence on renal dialysis: Secondary | ICD-10-CM | POA: Diagnosis not present

## 2016-01-15 DIAGNOSIS — N2581 Secondary hyperparathyroidism of renal origin: Secondary | ICD-10-CM | POA: Diagnosis not present

## 2016-01-15 DIAGNOSIS — D509 Iron deficiency anemia, unspecified: Secondary | ICD-10-CM | POA: Diagnosis not present

## 2016-01-17 DIAGNOSIS — D509 Iron deficiency anemia, unspecified: Secondary | ICD-10-CM | POA: Diagnosis not present

## 2016-01-17 DIAGNOSIS — Z992 Dependence on renal dialysis: Secondary | ICD-10-CM | POA: Diagnosis not present

## 2016-01-17 DIAGNOSIS — N2581 Secondary hyperparathyroidism of renal origin: Secondary | ICD-10-CM | POA: Diagnosis not present

## 2016-01-17 DIAGNOSIS — N186 End stage renal disease: Secondary | ICD-10-CM | POA: Diagnosis not present

## 2016-01-17 DIAGNOSIS — D631 Anemia in chronic kidney disease: Secondary | ICD-10-CM | POA: Diagnosis not present

## 2016-01-20 DIAGNOSIS — D509 Iron deficiency anemia, unspecified: Secondary | ICD-10-CM | POA: Diagnosis not present

## 2016-01-20 DIAGNOSIS — D631 Anemia in chronic kidney disease: Secondary | ICD-10-CM | POA: Diagnosis not present

## 2016-01-20 DIAGNOSIS — Z992 Dependence on renal dialysis: Secondary | ICD-10-CM | POA: Diagnosis not present

## 2016-01-20 DIAGNOSIS — N186 End stage renal disease: Secondary | ICD-10-CM | POA: Diagnosis not present

## 2016-01-20 DIAGNOSIS — N2581 Secondary hyperparathyroidism of renal origin: Secondary | ICD-10-CM | POA: Diagnosis not present

## 2016-01-22 DIAGNOSIS — Z992 Dependence on renal dialysis: Secondary | ICD-10-CM | POA: Diagnosis not present

## 2016-01-22 DIAGNOSIS — N2581 Secondary hyperparathyroidism of renal origin: Secondary | ICD-10-CM | POA: Diagnosis not present

## 2016-01-22 DIAGNOSIS — D509 Iron deficiency anemia, unspecified: Secondary | ICD-10-CM | POA: Diagnosis not present

## 2016-01-22 DIAGNOSIS — N186 End stage renal disease: Secondary | ICD-10-CM | POA: Diagnosis not present

## 2016-01-22 DIAGNOSIS — D631 Anemia in chronic kidney disease: Secondary | ICD-10-CM | POA: Diagnosis not present

## 2016-01-24 DIAGNOSIS — D631 Anemia in chronic kidney disease: Secondary | ICD-10-CM | POA: Diagnosis not present

## 2016-01-24 DIAGNOSIS — N2581 Secondary hyperparathyroidism of renal origin: Secondary | ICD-10-CM | POA: Diagnosis not present

## 2016-01-24 DIAGNOSIS — D509 Iron deficiency anemia, unspecified: Secondary | ICD-10-CM | POA: Diagnosis not present

## 2016-01-24 DIAGNOSIS — Z992 Dependence on renal dialysis: Secondary | ICD-10-CM | POA: Diagnosis not present

## 2016-01-24 DIAGNOSIS — N186 End stage renal disease: Secondary | ICD-10-CM | POA: Diagnosis not present

## 2016-01-27 DIAGNOSIS — Z992 Dependence on renal dialysis: Secondary | ICD-10-CM | POA: Diagnosis not present

## 2016-01-27 DIAGNOSIS — N2581 Secondary hyperparathyroidism of renal origin: Secondary | ICD-10-CM | POA: Diagnosis not present

## 2016-01-27 DIAGNOSIS — D509 Iron deficiency anemia, unspecified: Secondary | ICD-10-CM | POA: Diagnosis not present

## 2016-01-27 DIAGNOSIS — N186 End stage renal disease: Secondary | ICD-10-CM | POA: Diagnosis not present

## 2016-01-27 DIAGNOSIS — D631 Anemia in chronic kidney disease: Secondary | ICD-10-CM | POA: Diagnosis not present

## 2016-01-29 DIAGNOSIS — Z992 Dependence on renal dialysis: Secondary | ICD-10-CM | POA: Diagnosis not present

## 2016-01-29 DIAGNOSIS — D509 Iron deficiency anemia, unspecified: Secondary | ICD-10-CM | POA: Diagnosis not present

## 2016-01-29 DIAGNOSIS — D631 Anemia in chronic kidney disease: Secondary | ICD-10-CM | POA: Diagnosis not present

## 2016-01-29 DIAGNOSIS — N2581 Secondary hyperparathyroidism of renal origin: Secondary | ICD-10-CM | POA: Diagnosis not present

## 2016-01-29 DIAGNOSIS — N186 End stage renal disease: Secondary | ICD-10-CM | POA: Diagnosis not present

## 2016-01-30 DIAGNOSIS — N186 End stage renal disease: Secondary | ICD-10-CM | POA: Diagnosis not present

## 2016-01-30 DIAGNOSIS — Z992 Dependence on renal dialysis: Secondary | ICD-10-CM | POA: Diagnosis not present

## 2016-01-31 DIAGNOSIS — Z992 Dependence on renal dialysis: Secondary | ICD-10-CM | POA: Diagnosis not present

## 2016-01-31 DIAGNOSIS — D509 Iron deficiency anemia, unspecified: Secondary | ICD-10-CM | POA: Diagnosis not present

## 2016-01-31 DIAGNOSIS — N2581 Secondary hyperparathyroidism of renal origin: Secondary | ICD-10-CM | POA: Diagnosis not present

## 2016-01-31 DIAGNOSIS — N186 End stage renal disease: Secondary | ICD-10-CM | POA: Diagnosis not present

## 2016-01-31 DIAGNOSIS — D631 Anemia in chronic kidney disease: Secondary | ICD-10-CM | POA: Diagnosis not present

## 2016-02-03 DIAGNOSIS — Z992 Dependence on renal dialysis: Secondary | ICD-10-CM | POA: Diagnosis not present

## 2016-02-03 DIAGNOSIS — N186 End stage renal disease: Secondary | ICD-10-CM | POA: Diagnosis not present

## 2016-02-03 DIAGNOSIS — N2581 Secondary hyperparathyroidism of renal origin: Secondary | ICD-10-CM | POA: Diagnosis not present

## 2016-02-03 DIAGNOSIS — D509 Iron deficiency anemia, unspecified: Secondary | ICD-10-CM | POA: Diagnosis not present

## 2016-02-03 DIAGNOSIS — D631 Anemia in chronic kidney disease: Secondary | ICD-10-CM | POA: Diagnosis not present

## 2016-02-05 DIAGNOSIS — S0090XS Unspecified superficial injury of unspecified part of head, sequela: Secondary | ICD-10-CM | POA: Diagnosis not present

## 2016-02-05 DIAGNOSIS — M130 Polyarthritis, unspecified: Secondary | ICD-10-CM | POA: Diagnosis not present

## 2016-02-05 DIAGNOSIS — G3184 Mild cognitive impairment, so stated: Secondary | ICD-10-CM | POA: Diagnosis not present

## 2016-02-05 DIAGNOSIS — R269 Unspecified abnormalities of gait and mobility: Secondary | ICD-10-CM | POA: Diagnosis not present

## 2016-02-05 DIAGNOSIS — D509 Iron deficiency anemia, unspecified: Secondary | ICD-10-CM | POA: Diagnosis not present

## 2016-02-05 DIAGNOSIS — F0150 Vascular dementia without behavioral disturbance: Secondary | ICD-10-CM | POA: Diagnosis not present

## 2016-02-05 DIAGNOSIS — D631 Anemia in chronic kidney disease: Secondary | ICD-10-CM | POA: Diagnosis not present

## 2016-02-05 DIAGNOSIS — N2581 Secondary hyperparathyroidism of renal origin: Secondary | ICD-10-CM | POA: Diagnosis not present

## 2016-02-05 DIAGNOSIS — N186 End stage renal disease: Secondary | ICD-10-CM | POA: Diagnosis not present

## 2016-02-05 DIAGNOSIS — Z992 Dependence on renal dialysis: Secondary | ICD-10-CM | POA: Diagnosis not present

## 2016-02-05 DIAGNOSIS — I10 Essential (primary) hypertension: Secondary | ICD-10-CM | POA: Diagnosis not present

## 2016-02-05 DIAGNOSIS — M542 Cervicalgia: Secondary | ICD-10-CM | POA: Diagnosis not present

## 2016-02-05 DIAGNOSIS — M4712 Other spondylosis with myelopathy, cervical region: Secondary | ICD-10-CM | POA: Diagnosis not present

## 2016-02-07 DIAGNOSIS — Z992 Dependence on renal dialysis: Secondary | ICD-10-CM | POA: Diagnosis not present

## 2016-02-07 DIAGNOSIS — N186 End stage renal disease: Secondary | ICD-10-CM | POA: Diagnosis not present

## 2016-02-07 DIAGNOSIS — N2581 Secondary hyperparathyroidism of renal origin: Secondary | ICD-10-CM | POA: Diagnosis not present

## 2016-02-07 DIAGNOSIS — D631 Anemia in chronic kidney disease: Secondary | ICD-10-CM | POA: Diagnosis not present

## 2016-02-07 DIAGNOSIS — D509 Iron deficiency anemia, unspecified: Secondary | ICD-10-CM | POA: Diagnosis not present

## 2016-02-10 DIAGNOSIS — Z992 Dependence on renal dialysis: Secondary | ICD-10-CM | POA: Diagnosis not present

## 2016-02-10 DIAGNOSIS — N186 End stage renal disease: Secondary | ICD-10-CM | POA: Diagnosis not present

## 2016-02-10 DIAGNOSIS — D631 Anemia in chronic kidney disease: Secondary | ICD-10-CM | POA: Diagnosis not present

## 2016-02-10 DIAGNOSIS — D509 Iron deficiency anemia, unspecified: Secondary | ICD-10-CM | POA: Diagnosis not present

## 2016-02-10 DIAGNOSIS — N2581 Secondary hyperparathyroidism of renal origin: Secondary | ICD-10-CM | POA: Diagnosis not present

## 2016-02-12 DIAGNOSIS — Z992 Dependence on renal dialysis: Secondary | ICD-10-CM | POA: Diagnosis not present

## 2016-02-12 DIAGNOSIS — D509 Iron deficiency anemia, unspecified: Secondary | ICD-10-CM | POA: Diagnosis not present

## 2016-02-12 DIAGNOSIS — N186 End stage renal disease: Secondary | ICD-10-CM | POA: Diagnosis not present

## 2016-02-12 DIAGNOSIS — D631 Anemia in chronic kidney disease: Secondary | ICD-10-CM | POA: Diagnosis not present

## 2016-02-12 DIAGNOSIS — N2581 Secondary hyperparathyroidism of renal origin: Secondary | ICD-10-CM | POA: Diagnosis not present

## 2016-02-14 DIAGNOSIS — N2581 Secondary hyperparathyroidism of renal origin: Secondary | ICD-10-CM | POA: Diagnosis not present

## 2016-02-14 DIAGNOSIS — D509 Iron deficiency anemia, unspecified: Secondary | ICD-10-CM | POA: Diagnosis not present

## 2016-02-14 DIAGNOSIS — D631 Anemia in chronic kidney disease: Secondary | ICD-10-CM | POA: Diagnosis not present

## 2016-02-14 DIAGNOSIS — Z992 Dependence on renal dialysis: Secondary | ICD-10-CM | POA: Diagnosis not present

## 2016-02-14 DIAGNOSIS — N186 End stage renal disease: Secondary | ICD-10-CM | POA: Diagnosis not present

## 2016-02-17 DIAGNOSIS — N186 End stage renal disease: Secondary | ICD-10-CM | POA: Diagnosis not present

## 2016-02-17 DIAGNOSIS — D631 Anemia in chronic kidney disease: Secondary | ICD-10-CM | POA: Diagnosis not present

## 2016-02-17 DIAGNOSIS — N2581 Secondary hyperparathyroidism of renal origin: Secondary | ICD-10-CM | POA: Diagnosis not present

## 2016-02-17 DIAGNOSIS — Z992 Dependence on renal dialysis: Secondary | ICD-10-CM | POA: Diagnosis not present

## 2016-02-17 DIAGNOSIS — D509 Iron deficiency anemia, unspecified: Secondary | ICD-10-CM | POA: Diagnosis not present

## 2016-02-19 DIAGNOSIS — D631 Anemia in chronic kidney disease: Secondary | ICD-10-CM | POA: Diagnosis not present

## 2016-02-19 DIAGNOSIS — N186 End stage renal disease: Secondary | ICD-10-CM | POA: Diagnosis not present

## 2016-02-19 DIAGNOSIS — Z992 Dependence on renal dialysis: Secondary | ICD-10-CM | POA: Diagnosis not present

## 2016-02-19 DIAGNOSIS — N2581 Secondary hyperparathyroidism of renal origin: Secondary | ICD-10-CM | POA: Diagnosis not present

## 2016-02-19 DIAGNOSIS — D509 Iron deficiency anemia, unspecified: Secondary | ICD-10-CM | POA: Diagnosis not present

## 2016-02-21 DIAGNOSIS — N2581 Secondary hyperparathyroidism of renal origin: Secondary | ICD-10-CM | POA: Diagnosis not present

## 2016-02-21 DIAGNOSIS — D509 Iron deficiency anemia, unspecified: Secondary | ICD-10-CM | POA: Diagnosis not present

## 2016-02-21 DIAGNOSIS — Z992 Dependence on renal dialysis: Secondary | ICD-10-CM | POA: Diagnosis not present

## 2016-02-21 DIAGNOSIS — D631 Anemia in chronic kidney disease: Secondary | ICD-10-CM | POA: Diagnosis not present

## 2016-02-21 DIAGNOSIS — N186 End stage renal disease: Secondary | ICD-10-CM | POA: Diagnosis not present

## 2016-02-24 DIAGNOSIS — N186 End stage renal disease: Secondary | ICD-10-CM | POA: Diagnosis not present

## 2016-02-24 DIAGNOSIS — N2581 Secondary hyperparathyroidism of renal origin: Secondary | ICD-10-CM | POA: Diagnosis not present

## 2016-02-24 DIAGNOSIS — D631 Anemia in chronic kidney disease: Secondary | ICD-10-CM | POA: Diagnosis not present

## 2016-02-24 DIAGNOSIS — Z992 Dependence on renal dialysis: Secondary | ICD-10-CM | POA: Diagnosis not present

## 2016-02-24 DIAGNOSIS — D509 Iron deficiency anemia, unspecified: Secondary | ICD-10-CM | POA: Diagnosis not present

## 2016-02-26 DIAGNOSIS — Z992 Dependence on renal dialysis: Secondary | ICD-10-CM | POA: Diagnosis not present

## 2016-02-26 DIAGNOSIS — N186 End stage renal disease: Secondary | ICD-10-CM | POA: Diagnosis not present

## 2016-02-26 DIAGNOSIS — N2581 Secondary hyperparathyroidism of renal origin: Secondary | ICD-10-CM | POA: Diagnosis not present

## 2016-02-26 DIAGNOSIS — D509 Iron deficiency anemia, unspecified: Secondary | ICD-10-CM | POA: Diagnosis not present

## 2016-02-26 DIAGNOSIS — D631 Anemia in chronic kidney disease: Secondary | ICD-10-CM | POA: Diagnosis not present

## 2016-02-28 DIAGNOSIS — Z992 Dependence on renal dialysis: Secondary | ICD-10-CM | POA: Diagnosis not present

## 2016-02-28 DIAGNOSIS — N2581 Secondary hyperparathyroidism of renal origin: Secondary | ICD-10-CM | POA: Diagnosis not present

## 2016-02-28 DIAGNOSIS — D631 Anemia in chronic kidney disease: Secondary | ICD-10-CM | POA: Diagnosis not present

## 2016-02-28 DIAGNOSIS — D509 Iron deficiency anemia, unspecified: Secondary | ICD-10-CM | POA: Diagnosis not present

## 2016-02-28 DIAGNOSIS — N186 End stage renal disease: Secondary | ICD-10-CM | POA: Diagnosis not present

## 2016-03-01 DIAGNOSIS — Z992 Dependence on renal dialysis: Secondary | ICD-10-CM | POA: Diagnosis not present

## 2016-03-01 DIAGNOSIS — N186 End stage renal disease: Secondary | ICD-10-CM | POA: Diagnosis not present

## 2016-03-02 DIAGNOSIS — N186 End stage renal disease: Secondary | ICD-10-CM | POA: Diagnosis not present

## 2016-03-02 DIAGNOSIS — D631 Anemia in chronic kidney disease: Secondary | ICD-10-CM | POA: Diagnosis not present

## 2016-03-02 DIAGNOSIS — Z992 Dependence on renal dialysis: Secondary | ICD-10-CM | POA: Diagnosis not present

## 2016-03-02 DIAGNOSIS — D509 Iron deficiency anemia, unspecified: Secondary | ICD-10-CM | POA: Diagnosis not present

## 2016-03-02 DIAGNOSIS — N2581 Secondary hyperparathyroidism of renal origin: Secondary | ICD-10-CM | POA: Diagnosis not present

## 2016-03-03 DIAGNOSIS — R269 Unspecified abnormalities of gait and mobility: Secondary | ICD-10-CM | POA: Diagnosis not present

## 2016-03-03 DIAGNOSIS — S0090XS Unspecified superficial injury of unspecified part of head, sequela: Secondary | ICD-10-CM | POA: Diagnosis not present

## 2016-03-03 DIAGNOSIS — M542 Cervicalgia: Secondary | ICD-10-CM | POA: Diagnosis not present

## 2016-03-03 DIAGNOSIS — I10 Essential (primary) hypertension: Secondary | ICD-10-CM | POA: Diagnosis not present

## 2016-03-03 DIAGNOSIS — G3184 Mild cognitive impairment, so stated: Secondary | ICD-10-CM | POA: Diagnosis not present

## 2016-03-03 DIAGNOSIS — M130 Polyarthritis, unspecified: Secondary | ICD-10-CM | POA: Diagnosis not present

## 2016-03-03 DIAGNOSIS — M4712 Other spondylosis with myelopathy, cervical region: Secondary | ICD-10-CM | POA: Diagnosis not present

## 2016-03-03 DIAGNOSIS — G2581 Restless legs syndrome: Secondary | ICD-10-CM | POA: Diagnosis not present

## 2016-03-03 DIAGNOSIS — F0150 Vascular dementia without behavioral disturbance: Secondary | ICD-10-CM | POA: Diagnosis not present

## 2016-03-04 DIAGNOSIS — N186 End stage renal disease: Secondary | ICD-10-CM | POA: Diagnosis not present

## 2016-03-04 DIAGNOSIS — Z992 Dependence on renal dialysis: Secondary | ICD-10-CM | POA: Diagnosis not present

## 2016-03-04 DIAGNOSIS — D509 Iron deficiency anemia, unspecified: Secondary | ICD-10-CM | POA: Diagnosis not present

## 2016-03-04 DIAGNOSIS — N2581 Secondary hyperparathyroidism of renal origin: Secondary | ICD-10-CM | POA: Diagnosis not present

## 2016-03-04 DIAGNOSIS — D631 Anemia in chronic kidney disease: Secondary | ICD-10-CM | POA: Diagnosis not present

## 2016-03-06 DIAGNOSIS — N2581 Secondary hyperparathyroidism of renal origin: Secondary | ICD-10-CM | POA: Diagnosis not present

## 2016-03-06 DIAGNOSIS — Z992 Dependence on renal dialysis: Secondary | ICD-10-CM | POA: Diagnosis not present

## 2016-03-06 DIAGNOSIS — D509 Iron deficiency anemia, unspecified: Secondary | ICD-10-CM | POA: Diagnosis not present

## 2016-03-06 DIAGNOSIS — D631 Anemia in chronic kidney disease: Secondary | ICD-10-CM | POA: Diagnosis not present

## 2016-03-06 DIAGNOSIS — N186 End stage renal disease: Secondary | ICD-10-CM | POA: Diagnosis not present

## 2016-03-09 DIAGNOSIS — N2581 Secondary hyperparathyroidism of renal origin: Secondary | ICD-10-CM | POA: Diagnosis not present

## 2016-03-09 DIAGNOSIS — D631 Anemia in chronic kidney disease: Secondary | ICD-10-CM | POA: Diagnosis not present

## 2016-03-09 DIAGNOSIS — Z992 Dependence on renal dialysis: Secondary | ICD-10-CM | POA: Diagnosis not present

## 2016-03-09 DIAGNOSIS — N186 End stage renal disease: Secondary | ICD-10-CM | POA: Diagnosis not present

## 2016-03-09 DIAGNOSIS — D509 Iron deficiency anemia, unspecified: Secondary | ICD-10-CM | POA: Diagnosis not present

## 2016-03-11 DIAGNOSIS — N186 End stage renal disease: Secondary | ICD-10-CM | POA: Diagnosis not present

## 2016-03-11 DIAGNOSIS — D631 Anemia in chronic kidney disease: Secondary | ICD-10-CM | POA: Diagnosis not present

## 2016-03-11 DIAGNOSIS — Z992 Dependence on renal dialysis: Secondary | ICD-10-CM | POA: Diagnosis not present

## 2016-03-11 DIAGNOSIS — D509 Iron deficiency anemia, unspecified: Secondary | ICD-10-CM | POA: Diagnosis not present

## 2016-03-11 DIAGNOSIS — N2581 Secondary hyperparathyroidism of renal origin: Secondary | ICD-10-CM | POA: Diagnosis not present

## 2016-03-13 DIAGNOSIS — Z992 Dependence on renal dialysis: Secondary | ICD-10-CM | POA: Diagnosis not present

## 2016-03-13 DIAGNOSIS — N186 End stage renal disease: Secondary | ICD-10-CM | POA: Diagnosis not present

## 2016-03-13 DIAGNOSIS — N2581 Secondary hyperparathyroidism of renal origin: Secondary | ICD-10-CM | POA: Diagnosis not present

## 2016-03-13 DIAGNOSIS — D631 Anemia in chronic kidney disease: Secondary | ICD-10-CM | POA: Diagnosis not present

## 2016-03-13 DIAGNOSIS — D509 Iron deficiency anemia, unspecified: Secondary | ICD-10-CM | POA: Diagnosis not present

## 2016-03-16 DIAGNOSIS — N2581 Secondary hyperparathyroidism of renal origin: Secondary | ICD-10-CM | POA: Diagnosis not present

## 2016-03-16 DIAGNOSIS — Z992 Dependence on renal dialysis: Secondary | ICD-10-CM | POA: Diagnosis not present

## 2016-03-16 DIAGNOSIS — D509 Iron deficiency anemia, unspecified: Secondary | ICD-10-CM | POA: Diagnosis not present

## 2016-03-16 DIAGNOSIS — N186 End stage renal disease: Secondary | ICD-10-CM | POA: Diagnosis not present

## 2016-03-16 DIAGNOSIS — D631 Anemia in chronic kidney disease: Secondary | ICD-10-CM | POA: Diagnosis not present

## 2016-03-18 DIAGNOSIS — D631 Anemia in chronic kidney disease: Secondary | ICD-10-CM | POA: Diagnosis not present

## 2016-03-18 DIAGNOSIS — Z992 Dependence on renal dialysis: Secondary | ICD-10-CM | POA: Diagnosis not present

## 2016-03-18 DIAGNOSIS — N186 End stage renal disease: Secondary | ICD-10-CM | POA: Diagnosis not present

## 2016-03-18 DIAGNOSIS — N2581 Secondary hyperparathyroidism of renal origin: Secondary | ICD-10-CM | POA: Diagnosis not present

## 2016-03-18 DIAGNOSIS — D509 Iron deficiency anemia, unspecified: Secondary | ICD-10-CM | POA: Diagnosis not present

## 2016-03-20 DIAGNOSIS — N2581 Secondary hyperparathyroidism of renal origin: Secondary | ICD-10-CM | POA: Diagnosis not present

## 2016-03-20 DIAGNOSIS — N186 End stage renal disease: Secondary | ICD-10-CM | POA: Diagnosis not present

## 2016-03-20 DIAGNOSIS — D509 Iron deficiency anemia, unspecified: Secondary | ICD-10-CM | POA: Diagnosis not present

## 2016-03-20 DIAGNOSIS — D631 Anemia in chronic kidney disease: Secondary | ICD-10-CM | POA: Diagnosis not present

## 2016-03-20 DIAGNOSIS — Z992 Dependence on renal dialysis: Secondary | ICD-10-CM | POA: Diagnosis not present

## 2016-03-23 DIAGNOSIS — D631 Anemia in chronic kidney disease: Secondary | ICD-10-CM | POA: Diagnosis not present

## 2016-03-23 DIAGNOSIS — Z992 Dependence on renal dialysis: Secondary | ICD-10-CM | POA: Diagnosis not present

## 2016-03-23 DIAGNOSIS — N2581 Secondary hyperparathyroidism of renal origin: Secondary | ICD-10-CM | POA: Diagnosis not present

## 2016-03-23 DIAGNOSIS — N186 End stage renal disease: Secondary | ICD-10-CM | POA: Diagnosis not present

## 2016-03-23 DIAGNOSIS — D509 Iron deficiency anemia, unspecified: Secondary | ICD-10-CM | POA: Diagnosis not present

## 2016-03-25 DIAGNOSIS — D509 Iron deficiency anemia, unspecified: Secondary | ICD-10-CM | POA: Diagnosis not present

## 2016-03-25 DIAGNOSIS — Z992 Dependence on renal dialysis: Secondary | ICD-10-CM | POA: Diagnosis not present

## 2016-03-25 DIAGNOSIS — D631 Anemia in chronic kidney disease: Secondary | ICD-10-CM | POA: Diagnosis not present

## 2016-03-25 DIAGNOSIS — N186 End stage renal disease: Secondary | ICD-10-CM | POA: Diagnosis not present

## 2016-03-25 DIAGNOSIS — N2581 Secondary hyperparathyroidism of renal origin: Secondary | ICD-10-CM | POA: Diagnosis not present

## 2016-03-27 DIAGNOSIS — N186 End stage renal disease: Secondary | ICD-10-CM | POA: Diagnosis not present

## 2016-03-27 DIAGNOSIS — D631 Anemia in chronic kidney disease: Secondary | ICD-10-CM | POA: Diagnosis not present

## 2016-03-27 DIAGNOSIS — D509 Iron deficiency anemia, unspecified: Secondary | ICD-10-CM | POA: Diagnosis not present

## 2016-03-27 DIAGNOSIS — N2581 Secondary hyperparathyroidism of renal origin: Secondary | ICD-10-CM | POA: Diagnosis not present

## 2016-03-27 DIAGNOSIS — Z992 Dependence on renal dialysis: Secondary | ICD-10-CM | POA: Diagnosis not present

## 2016-03-30 DIAGNOSIS — N2581 Secondary hyperparathyroidism of renal origin: Secondary | ICD-10-CM | POA: Diagnosis not present

## 2016-03-30 DIAGNOSIS — Z992 Dependence on renal dialysis: Secondary | ICD-10-CM | POA: Diagnosis not present

## 2016-03-30 DIAGNOSIS — N186 End stage renal disease: Secondary | ICD-10-CM | POA: Diagnosis not present

## 2016-03-30 DIAGNOSIS — D631 Anemia in chronic kidney disease: Secondary | ICD-10-CM | POA: Diagnosis not present

## 2016-03-30 DIAGNOSIS — D509 Iron deficiency anemia, unspecified: Secondary | ICD-10-CM | POA: Diagnosis not present

## 2016-04-01 DIAGNOSIS — Z992 Dependence on renal dialysis: Secondary | ICD-10-CM | POA: Diagnosis not present

## 2016-04-01 DIAGNOSIS — N2581 Secondary hyperparathyroidism of renal origin: Secondary | ICD-10-CM | POA: Diagnosis not present

## 2016-04-01 DIAGNOSIS — D509 Iron deficiency anemia, unspecified: Secondary | ICD-10-CM | POA: Diagnosis not present

## 2016-04-01 DIAGNOSIS — D631 Anemia in chronic kidney disease: Secondary | ICD-10-CM | POA: Diagnosis not present

## 2016-04-01 DIAGNOSIS — N186 End stage renal disease: Secondary | ICD-10-CM | POA: Diagnosis not present

## 2016-04-03 DIAGNOSIS — D509 Iron deficiency anemia, unspecified: Secondary | ICD-10-CM | POA: Diagnosis not present

## 2016-04-03 DIAGNOSIS — N2581 Secondary hyperparathyroidism of renal origin: Secondary | ICD-10-CM | POA: Diagnosis not present

## 2016-04-03 DIAGNOSIS — N186 End stage renal disease: Secondary | ICD-10-CM | POA: Diagnosis not present

## 2016-04-03 DIAGNOSIS — Z992 Dependence on renal dialysis: Secondary | ICD-10-CM | POA: Diagnosis not present

## 2016-04-03 DIAGNOSIS — Z23 Encounter for immunization: Secondary | ICD-10-CM | POA: Diagnosis not present

## 2016-04-03 DIAGNOSIS — D631 Anemia in chronic kidney disease: Secondary | ICD-10-CM | POA: Diagnosis not present

## 2016-04-06 DIAGNOSIS — N186 End stage renal disease: Secondary | ICD-10-CM | POA: Diagnosis not present

## 2016-04-06 DIAGNOSIS — D631 Anemia in chronic kidney disease: Secondary | ICD-10-CM | POA: Diagnosis not present

## 2016-04-06 DIAGNOSIS — Z992 Dependence on renal dialysis: Secondary | ICD-10-CM | POA: Diagnosis not present

## 2016-04-06 DIAGNOSIS — D509 Iron deficiency anemia, unspecified: Secondary | ICD-10-CM | POA: Diagnosis not present

## 2016-04-06 DIAGNOSIS — Z23 Encounter for immunization: Secondary | ICD-10-CM | POA: Diagnosis not present

## 2016-04-06 DIAGNOSIS — N2581 Secondary hyperparathyroidism of renal origin: Secondary | ICD-10-CM | POA: Diagnosis not present

## 2016-04-08 DIAGNOSIS — N2581 Secondary hyperparathyroidism of renal origin: Secondary | ICD-10-CM | POA: Diagnosis not present

## 2016-04-08 DIAGNOSIS — Z23 Encounter for immunization: Secondary | ICD-10-CM | POA: Diagnosis not present

## 2016-04-08 DIAGNOSIS — D631 Anemia in chronic kidney disease: Secondary | ICD-10-CM | POA: Diagnosis not present

## 2016-04-08 DIAGNOSIS — Z992 Dependence on renal dialysis: Secondary | ICD-10-CM | POA: Diagnosis not present

## 2016-04-08 DIAGNOSIS — D509 Iron deficiency anemia, unspecified: Secondary | ICD-10-CM | POA: Diagnosis not present

## 2016-04-08 DIAGNOSIS — N186 End stage renal disease: Secondary | ICD-10-CM | POA: Diagnosis not present

## 2016-04-10 DIAGNOSIS — Z23 Encounter for immunization: Secondary | ICD-10-CM | POA: Diagnosis not present

## 2016-04-10 DIAGNOSIS — D509 Iron deficiency anemia, unspecified: Secondary | ICD-10-CM | POA: Diagnosis not present

## 2016-04-10 DIAGNOSIS — N186 End stage renal disease: Secondary | ICD-10-CM | POA: Diagnosis not present

## 2016-04-10 DIAGNOSIS — D631 Anemia in chronic kidney disease: Secondary | ICD-10-CM | POA: Diagnosis not present

## 2016-04-10 DIAGNOSIS — Z992 Dependence on renal dialysis: Secondary | ICD-10-CM | POA: Diagnosis not present

## 2016-04-10 DIAGNOSIS — N2581 Secondary hyperparathyroidism of renal origin: Secondary | ICD-10-CM | POA: Diagnosis not present

## 2016-04-12 DIAGNOSIS — D509 Iron deficiency anemia, unspecified: Secondary | ICD-10-CM | POA: Diagnosis not present

## 2016-04-12 DIAGNOSIS — Z23 Encounter for immunization: Secondary | ICD-10-CM | POA: Diagnosis not present

## 2016-04-12 DIAGNOSIS — Z992 Dependence on renal dialysis: Secondary | ICD-10-CM | POA: Diagnosis not present

## 2016-04-12 DIAGNOSIS — D631 Anemia in chronic kidney disease: Secondary | ICD-10-CM | POA: Diagnosis not present

## 2016-04-12 DIAGNOSIS — N186 End stage renal disease: Secondary | ICD-10-CM | POA: Diagnosis not present

## 2016-04-12 DIAGNOSIS — N2581 Secondary hyperparathyroidism of renal origin: Secondary | ICD-10-CM | POA: Diagnosis not present

## 2016-04-15 DIAGNOSIS — D509 Iron deficiency anemia, unspecified: Secondary | ICD-10-CM | POA: Diagnosis not present

## 2016-04-15 DIAGNOSIS — N186 End stage renal disease: Secondary | ICD-10-CM | POA: Diagnosis not present

## 2016-04-15 DIAGNOSIS — D631 Anemia in chronic kidney disease: Secondary | ICD-10-CM | POA: Diagnosis not present

## 2016-04-15 DIAGNOSIS — N2581 Secondary hyperparathyroidism of renal origin: Secondary | ICD-10-CM | POA: Diagnosis not present

## 2016-04-15 DIAGNOSIS — Z23 Encounter for immunization: Secondary | ICD-10-CM | POA: Diagnosis not present

## 2016-04-15 DIAGNOSIS — Z992 Dependence on renal dialysis: Secondary | ICD-10-CM | POA: Diagnosis not present

## 2016-04-17 DIAGNOSIS — Z23 Encounter for immunization: Secondary | ICD-10-CM | POA: Diagnosis not present

## 2016-04-17 DIAGNOSIS — N2581 Secondary hyperparathyroidism of renal origin: Secondary | ICD-10-CM | POA: Diagnosis not present

## 2016-04-17 DIAGNOSIS — D631 Anemia in chronic kidney disease: Secondary | ICD-10-CM | POA: Diagnosis not present

## 2016-04-17 DIAGNOSIS — Z992 Dependence on renal dialysis: Secondary | ICD-10-CM | POA: Diagnosis not present

## 2016-04-17 DIAGNOSIS — N186 End stage renal disease: Secondary | ICD-10-CM | POA: Diagnosis not present

## 2016-04-17 DIAGNOSIS — D509 Iron deficiency anemia, unspecified: Secondary | ICD-10-CM | POA: Diagnosis not present

## 2016-04-20 DIAGNOSIS — D509 Iron deficiency anemia, unspecified: Secondary | ICD-10-CM | POA: Diagnosis not present

## 2016-04-20 DIAGNOSIS — N186 End stage renal disease: Secondary | ICD-10-CM | POA: Diagnosis not present

## 2016-04-20 DIAGNOSIS — Z23 Encounter for immunization: Secondary | ICD-10-CM | POA: Diagnosis not present

## 2016-04-20 DIAGNOSIS — Z992 Dependence on renal dialysis: Secondary | ICD-10-CM | POA: Diagnosis not present

## 2016-04-20 DIAGNOSIS — N2581 Secondary hyperparathyroidism of renal origin: Secondary | ICD-10-CM | POA: Diagnosis not present

## 2016-04-20 DIAGNOSIS — D631 Anemia in chronic kidney disease: Secondary | ICD-10-CM | POA: Diagnosis not present

## 2016-04-22 DIAGNOSIS — Z23 Encounter for immunization: Secondary | ICD-10-CM | POA: Diagnosis not present

## 2016-04-22 DIAGNOSIS — N186 End stage renal disease: Secondary | ICD-10-CM | POA: Diagnosis not present

## 2016-04-22 DIAGNOSIS — D631 Anemia in chronic kidney disease: Secondary | ICD-10-CM | POA: Diagnosis not present

## 2016-04-22 DIAGNOSIS — Z992 Dependence on renal dialysis: Secondary | ICD-10-CM | POA: Diagnosis not present

## 2016-04-22 DIAGNOSIS — N2581 Secondary hyperparathyroidism of renal origin: Secondary | ICD-10-CM | POA: Diagnosis not present

## 2016-04-22 DIAGNOSIS — D509 Iron deficiency anemia, unspecified: Secondary | ICD-10-CM | POA: Diagnosis not present

## 2016-04-24 DIAGNOSIS — D509 Iron deficiency anemia, unspecified: Secondary | ICD-10-CM | POA: Diagnosis not present

## 2016-04-24 DIAGNOSIS — D631 Anemia in chronic kidney disease: Secondary | ICD-10-CM | POA: Diagnosis not present

## 2016-04-24 DIAGNOSIS — N186 End stage renal disease: Secondary | ICD-10-CM | POA: Diagnosis not present

## 2016-04-24 DIAGNOSIS — N2581 Secondary hyperparathyroidism of renal origin: Secondary | ICD-10-CM | POA: Diagnosis not present

## 2016-04-24 DIAGNOSIS — Z23 Encounter for immunization: Secondary | ICD-10-CM | POA: Diagnosis not present

## 2016-04-24 DIAGNOSIS — Z992 Dependence on renal dialysis: Secondary | ICD-10-CM | POA: Diagnosis not present

## 2016-04-27 DIAGNOSIS — D631 Anemia in chronic kidney disease: Secondary | ICD-10-CM | POA: Diagnosis not present

## 2016-04-27 DIAGNOSIS — N186 End stage renal disease: Secondary | ICD-10-CM | POA: Diagnosis not present

## 2016-04-27 DIAGNOSIS — N2581 Secondary hyperparathyroidism of renal origin: Secondary | ICD-10-CM | POA: Diagnosis not present

## 2016-04-27 DIAGNOSIS — Z992 Dependence on renal dialysis: Secondary | ICD-10-CM | POA: Diagnosis not present

## 2016-04-27 DIAGNOSIS — Z23 Encounter for immunization: Secondary | ICD-10-CM | POA: Diagnosis not present

## 2016-04-27 DIAGNOSIS — D509 Iron deficiency anemia, unspecified: Secondary | ICD-10-CM | POA: Diagnosis not present

## 2016-04-29 DIAGNOSIS — D631 Anemia in chronic kidney disease: Secondary | ICD-10-CM | POA: Diagnosis not present

## 2016-04-29 DIAGNOSIS — Z23 Encounter for immunization: Secondary | ICD-10-CM | POA: Diagnosis not present

## 2016-04-29 DIAGNOSIS — N2581 Secondary hyperparathyroidism of renal origin: Secondary | ICD-10-CM | POA: Diagnosis not present

## 2016-04-29 DIAGNOSIS — Z992 Dependence on renal dialysis: Secondary | ICD-10-CM | POA: Diagnosis not present

## 2016-04-29 DIAGNOSIS — D509 Iron deficiency anemia, unspecified: Secondary | ICD-10-CM | POA: Diagnosis not present

## 2016-04-29 DIAGNOSIS — N186 End stage renal disease: Secondary | ICD-10-CM | POA: Diagnosis not present

## 2016-05-01 DIAGNOSIS — N186 End stage renal disease: Secondary | ICD-10-CM | POA: Diagnosis not present

## 2016-05-01 DIAGNOSIS — Z23 Encounter for immunization: Secondary | ICD-10-CM | POA: Diagnosis not present

## 2016-05-01 DIAGNOSIS — Z992 Dependence on renal dialysis: Secondary | ICD-10-CM | POA: Diagnosis not present

## 2016-05-01 DIAGNOSIS — D631 Anemia in chronic kidney disease: Secondary | ICD-10-CM | POA: Diagnosis not present

## 2016-05-01 DIAGNOSIS — N2581 Secondary hyperparathyroidism of renal origin: Secondary | ICD-10-CM | POA: Diagnosis not present

## 2016-05-01 DIAGNOSIS — D509 Iron deficiency anemia, unspecified: Secondary | ICD-10-CM | POA: Diagnosis not present

## 2016-05-03 DIAGNOSIS — G63 Polyneuropathy in diseases classified elsewhere: Secondary | ICD-10-CM | POA: Diagnosis not present

## 2016-05-03 DIAGNOSIS — M542 Cervicalgia: Secondary | ICD-10-CM | POA: Diagnosis not present

## 2016-05-03 DIAGNOSIS — G2581 Restless legs syndrome: Secondary | ICD-10-CM | POA: Diagnosis not present

## 2016-05-03 DIAGNOSIS — S0090XS Unspecified superficial injury of unspecified part of head, sequela: Secondary | ICD-10-CM | POA: Diagnosis not present

## 2016-05-03 DIAGNOSIS — R269 Unspecified abnormalities of gait and mobility: Secondary | ICD-10-CM | POA: Diagnosis not present

## 2016-05-03 DIAGNOSIS — M4712 Other spondylosis with myelopathy, cervical region: Secondary | ICD-10-CM | POA: Diagnosis not present

## 2016-05-03 DIAGNOSIS — G3184 Mild cognitive impairment, so stated: Secondary | ICD-10-CM | POA: Diagnosis not present

## 2016-05-03 DIAGNOSIS — M130 Polyarthritis, unspecified: Secondary | ICD-10-CM | POA: Diagnosis not present

## 2016-05-03 DIAGNOSIS — F0150 Vascular dementia without behavioral disturbance: Secondary | ICD-10-CM | POA: Diagnosis not present

## 2016-05-03 DIAGNOSIS — I10 Essential (primary) hypertension: Secondary | ICD-10-CM | POA: Diagnosis not present

## 2016-05-04 DIAGNOSIS — D631 Anemia in chronic kidney disease: Secondary | ICD-10-CM | POA: Diagnosis not present

## 2016-05-04 DIAGNOSIS — Z992 Dependence on renal dialysis: Secondary | ICD-10-CM | POA: Diagnosis not present

## 2016-05-04 DIAGNOSIS — N2581 Secondary hyperparathyroidism of renal origin: Secondary | ICD-10-CM | POA: Diagnosis not present

## 2016-05-04 DIAGNOSIS — D509 Iron deficiency anemia, unspecified: Secondary | ICD-10-CM | POA: Diagnosis not present

## 2016-05-04 DIAGNOSIS — N186 End stage renal disease: Secondary | ICD-10-CM | POA: Diagnosis not present

## 2016-05-06 DIAGNOSIS — N186 End stage renal disease: Secondary | ICD-10-CM | POA: Diagnosis not present

## 2016-05-06 DIAGNOSIS — N2581 Secondary hyperparathyroidism of renal origin: Secondary | ICD-10-CM | POA: Diagnosis not present

## 2016-05-06 DIAGNOSIS — D631 Anemia in chronic kidney disease: Secondary | ICD-10-CM | POA: Diagnosis not present

## 2016-05-06 DIAGNOSIS — D509 Iron deficiency anemia, unspecified: Secondary | ICD-10-CM | POA: Diagnosis not present

## 2016-05-06 DIAGNOSIS — Z992 Dependence on renal dialysis: Secondary | ICD-10-CM | POA: Diagnosis not present

## 2016-05-08 DIAGNOSIS — D631 Anemia in chronic kidney disease: Secondary | ICD-10-CM | POA: Diagnosis not present

## 2016-05-08 DIAGNOSIS — N186 End stage renal disease: Secondary | ICD-10-CM | POA: Diagnosis not present

## 2016-05-08 DIAGNOSIS — Z992 Dependence on renal dialysis: Secondary | ICD-10-CM | POA: Diagnosis not present

## 2016-05-08 DIAGNOSIS — D509 Iron deficiency anemia, unspecified: Secondary | ICD-10-CM | POA: Diagnosis not present

## 2016-05-08 DIAGNOSIS — N2581 Secondary hyperparathyroidism of renal origin: Secondary | ICD-10-CM | POA: Diagnosis not present

## 2016-05-11 DIAGNOSIS — D631 Anemia in chronic kidney disease: Secondary | ICD-10-CM | POA: Diagnosis not present

## 2016-05-11 DIAGNOSIS — Z992 Dependence on renal dialysis: Secondary | ICD-10-CM | POA: Diagnosis not present

## 2016-05-11 DIAGNOSIS — N186 End stage renal disease: Secondary | ICD-10-CM | POA: Diagnosis not present

## 2016-05-11 DIAGNOSIS — D509 Iron deficiency anemia, unspecified: Secondary | ICD-10-CM | POA: Diagnosis not present

## 2016-05-11 DIAGNOSIS — N2581 Secondary hyperparathyroidism of renal origin: Secondary | ICD-10-CM | POA: Diagnosis not present

## 2016-05-12 ENCOUNTER — Encounter: Payer: Self-pay | Admitting: Gastroenterology

## 2016-05-12 ENCOUNTER — Ambulatory Visit (INDEPENDENT_AMBULATORY_CARE_PROVIDER_SITE_OTHER): Payer: Medicare Other | Admitting: Gastroenterology

## 2016-05-12 VITALS — BP 109/62 | HR 69 | Temp 97.9°F | Ht 67.0 in | Wt 154.4 lb

## 2016-05-12 DIAGNOSIS — R634 Abnormal weight loss: Secondary | ICD-10-CM

## 2016-05-12 NOTE — Progress Notes (Signed)
cc'ed to pcp °

## 2016-05-12 NOTE — Assessment & Plan Note (Signed)
Improved. Back to baseline. Prior weight loss likely secondary to decreased appetite in setting of acute illness. She is doing quite well now. Recent flex sig on file after abnormal CT with normal findings. Return in 6 months.

## 2016-05-12 NOTE — Patient Instructions (Signed)
I am glad you are doing well!  We will see you back in 6 months!  Please let us know if you start losing weight again.

## 2016-05-12 NOTE — Progress Notes (Addendum)
REVIEWED-NO ADDITIONAL RECOMMENDATIONS.  Referring Provider: Redmond School, MD Primary Care Physician:  [name], MD Primary GI: Dr. Oneida Alar   Chief Complaint  Patient presents with  . Follow-up    had flex sig, doing ok    HPI:   Sonya [name] is a 65 y.o. female presenting today with a history of non-specific weight loss. EGD Oct 2016 with reactive gastropathy, esophageal stricture. Last colonoscopy in 2015. CT abd/pelvis done May 2017 with non-specific mild wall thickening in rectum. Flex sig completed with normal proximal rectum. Her weight is up almost 30 lbs since June and close to baseline.   Stomach had been growling a lot and made her nervous. Specifically at night. Started eating raisin brain and it stopped. No abdominal pain. No nausea or vomiting. Had been in the hospital in Jan with pneumonia and appetite wasn't great. Now appetite has picked back up.   Past Medical History:  Diagnosis Date  . Anemia   . CKD (chronic kidney disease)   . Diverticulitis   . Dysphagia   . ESRD (end stage renal disease) ([place])    On HD  . Fracture of femoral neck, left, closed ([place]) 05/30/2015  . Gastritis 06/2013 & 05/2015  . Gastroesophageal reflux disease with stricture 05/2015  . GERD (gastroesophageal reflux disease)   . HTN (hypertension)   . Hyperparathyroidism due to renal insufficiency ([place])   . Hypertensive urgency 06/2013 & 04/2015  . Hypothyroidism   . ICH (intracerebral hemorrhage) ([place]) 10/2014  . 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 Protein calorie malnutrition ([place])   . Uterine cancer ([place]) 2012  . Vertical diplopia   . Vertigo     Past Surgical History:  Procedure Laterality Date  . ABDOMINAL HYSTERECTOMY    . APPENDECTOMY    . BASCILIC VEIN TRANSPOSITION Right 09/17/2013   Procedure: BRACHIAL VEIN TRANSPOSITION;  Surgeon: Conrad [place], MD;  Location: [facility];  Service: Vascular;  Laterality: Right;  . BASCILIC VEIN TRANSPOSITION Right 10/29/2013   Procedure: RIGHT  SECOND STAGE BRACHIAL VEIN TRANSPOSITION;  Surgeon: Conrad Dunlap, MD;  Location: [facility];  Service: Vascular;  Laterality: Right;  . CESAREAN SECTION    . CHOLECYSTECTOMY    . COLONOSCOPY  2000   TICS, IH  . COLONOSCOPY  2003 NUR BRBPR D50 V6   DC/ TICS, IH  . COLONOSCOPY N/A 09/04/2013   SLF: Small ulcer in the descending colon, one colon polyp (tubular adenoma), large internal hemorrhoids, moderate diverticulosis. next tcs 10 years.  . ESOPHAGOGASTRODUODENOSCOPY N/A 05/23/2015   TKP:TWSF non-erosive gastritis/stricture at the gastroesophageal junction  . ESOPHAGOGASTRODUODENOSCOPY (EGD) WITH ESOPHAGEAL DILATION N/A 06/08/2013   Dr. Fields:Stricture at the gastroesophageal junction/MILD NON-erosive gastritis (inflammation) was found in the gastric antrum; multiple biopsies/NO SOURCE FOR ANEMIA IDETIFIED-MOST LIKELY DUE TO ANEMIA OF CHRONIC DISEASE  . FLEXIBLE SIGMOIDOSCOPY N/A 01/02/2016   Dr. Oneida Alar: external hemorrhoids, diverticulosis, internal hemorrhoids, proximal rectum normal  . GIVENS CAPSULE STUDY N/A 12/03/2013   SLF: occasional gastric erosion. small bowel normal  . HIP PINNING,CANNULATED Left 05/31/2015   Procedure: CANNULATED HIP PINNING;  Surgeon: Leandrew Koyanagi, MD;  Location: [facility];  Service: Orthopedics;  Laterality: Left;  . INSERTION OF DIALYSIS CATHETER Left 09/17/2013   Procedure: INSERTION OF DIALYSIS CATHETER;  Surgeon: Conrad [place], MD;  Location: [facility];  Service: Vascular;  Laterality: Left;  . LAPAROSCOPIC TOTAL HYSTERECTOMY    . NASAL SEPTUM SURGERY    . SAVORY DILATION N/A 05/23/2015   Procedure: SAVORY  DILATION;  Surgeon: Danie Binder, MD;  [facility];  Service: Endoscopy;  Laterality: N/A;    Current Outpatient Prescriptions  Medication Sig Dispense Refill  . alendronate (FOSAMAX) 70 MG tablet Take 70 mg by mouth once a week. Take with a full glass of water on an empty stomach.    Marland Kitchen allopurinol (ZYLOPRIM) 300 MG tablet Take 0.5 tablets (150 mg total)  by mouth daily. Dose was decreased secondary to renal failure.    . doxazosin (CARDURA) 8 MG tablet Take 1 tablet (8 mg total) by mouth daily. 30 tablet 0  . levothyroxine (SYNTHROID, LEVOTHROID) 50 MCG tablet Take 1 tablet (50 mcg total) by mouth daily. 30 tablet 0  . lidocaine-prilocaine (EMLA) cream Apply 1 application topically every Tuesday, Thursday, and Saturday at 6 PM.     . lisinopril (PRINIVIL,ZESTRIL) 40 MG tablet Take 1 tablet (40 mg total) by mouth daily.    . meclizine (ANTIVERT) 25 MG tablet Take 1 tablet by mouth every 6 (six) hours as needed for dizziness. dizziness    . metoCLOPramide (REGLAN) 5 MG tablet 1 po before breakfast. 90 tablet 11  . NIFEdipine (PROCARDIA XL/ADALAT-CC) 90 MG 24 hr tablet Take 90 mg by mouth daily.    Marland Kitchen omeprazole (PRILOSEC) 20 MG capsule TAKE ONE CAPSULE BY MOUTH ONCE DAILY 31 capsule 5  . senna-docusate (SENOKOT-S) 8.6-50 MG tablet Take 1 tablet by mouth 2 (two) times daily.     . sevelamer carbonate (RENVELA) 800 MG tablet Take 2,400 mg by mouth 3 (three) times daily with meals.      No current facility-administered medications for this visit.     Allergies as of 05/12/2016 - Review Complete 05/12/2016  Allergen Reaction Noted  . Cephalexin Itching 04/08/2011    Family History  Problem Relation Age of Onset  . Cancer Mother   . Diabetes Mother   . Hypertension Mother   . Diabetes Father   . Hypertension Father   . Hypertension Sister   . Colon cancer Neg Hx     Social History   Social History  . Marital status: Married    Spouse name: N/A  . Number of children: N/A  . Years of education: N/A   Social History Main Topics  . Smoking status: Never Smoker  . Smokeless tobacco: Never Used  . Alcohol use No  . Drug use: No  . Sexual activity: Not Asked   Other Topics Concern  . 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    Review of Systems: Negative unless mentioned in HPI.    Physical Exam: BP 109/62   Pulse 69   Temp 97.9 F (36.6 C) (Oral)   Ht 5\' 7"  (1.702 m)   Wt 154 lb 6.4 oz (70 kg)   BMI 24.18 kg/m  General:   Alert and oriented. No distress noted. Pleasant and cooperative.  Head:  Normocephalic and atraumatic. Eyes:  Conjuctiva clear without scleral icterus. Abdomen:  +BS, soft, non-tender and non-distended. No rebound or guarding. No HSM or masses noted. Msk:  Symmetrical without gross deformities. Normal posture. Extremities:  Without edema. Neurologic:  Alert and  oriented x4;  grossly normal neurologically. Psych:  Alert and cooperative. Normal mood and affect.

## 2016-05-13 DIAGNOSIS — D631 Anemia in chronic kidney disease: Secondary | ICD-10-CM | POA: Diagnosis not present

## 2016-05-13 DIAGNOSIS — N186 End stage renal disease: Secondary | ICD-10-CM | POA: Diagnosis not present

## 2016-05-13 DIAGNOSIS — D509 Iron deficiency anemia, unspecified: Secondary | ICD-10-CM | POA: Diagnosis not present

## 2016-05-13 DIAGNOSIS — Z992 Dependence on renal dialysis: Secondary | ICD-10-CM | POA: Diagnosis not present

## 2016-05-13 DIAGNOSIS — N2581 Secondary hyperparathyroidism of renal origin: Secondary | ICD-10-CM | POA: Diagnosis not present

## 2016-05-15 DIAGNOSIS — D631 Anemia in chronic kidney disease: Secondary | ICD-10-CM | POA: Diagnosis not present

## 2016-05-15 DIAGNOSIS — N186 End stage renal disease: Secondary | ICD-10-CM | POA: Diagnosis not present

## 2016-05-15 DIAGNOSIS — Z992 Dependence on renal dialysis: Secondary | ICD-10-CM | POA: Diagnosis not present

## 2016-05-15 DIAGNOSIS — D509 Iron deficiency anemia, unspecified: Secondary | ICD-10-CM | POA: Diagnosis not present

## 2016-05-15 DIAGNOSIS — N2581 Secondary hyperparathyroidism of renal origin: Secondary | ICD-10-CM | POA: Diagnosis not present

## 2016-05-17 ENCOUNTER — Other Ambulatory Visit (HOSPITAL_COMMUNITY): Payer: Self-pay | Admitting: Internal Medicine

## 2016-05-17 ENCOUNTER — Other Ambulatory Visit: Payer: Self-pay | Admitting: Nurse Practitioner

## 2016-05-17 DIAGNOSIS — Z1231 Encounter for screening mammogram for malignant neoplasm of breast: Secondary | ICD-10-CM

## 2016-05-18 DIAGNOSIS — N2581 Secondary hyperparathyroidism of renal origin: Secondary | ICD-10-CM | POA: Diagnosis not present

## 2016-05-18 DIAGNOSIS — D631 Anemia in chronic kidney disease: Secondary | ICD-10-CM | POA: Diagnosis not present

## 2016-05-18 DIAGNOSIS — D509 Iron deficiency anemia, unspecified: Secondary | ICD-10-CM | POA: Diagnosis not present

## 2016-05-18 DIAGNOSIS — N186 End stage renal disease: Secondary | ICD-10-CM | POA: Diagnosis not present

## 2016-05-18 DIAGNOSIS — Z992 Dependence on renal dialysis: Secondary | ICD-10-CM | POA: Diagnosis not present

## 2016-05-19 ENCOUNTER — Ambulatory Visit (HOSPITAL_COMMUNITY)
Admission: RE | Admit: 2016-05-19 | Discharge: 2016-05-19 | Disposition: A | Discharge status: Home / Self Care | Payer: Medicare Other | Source: Ambulatory Visit | Attending: Internal Medicine | Admitting: Internal Medicine

## 2016-05-19 DIAGNOSIS — Z1231 Encounter for screening mammogram for malignant neoplasm of breast: Secondary | ICD-10-CM

## 2016-05-20 DIAGNOSIS — D509 Iron deficiency anemia, unspecified: Secondary | ICD-10-CM | POA: Diagnosis not present

## 2016-05-20 DIAGNOSIS — D631 Anemia in chronic kidney disease: Secondary | ICD-10-CM | POA: Diagnosis not present

## 2016-05-20 DIAGNOSIS — N2581 Secondary hyperparathyroidism of renal origin: Secondary | ICD-10-CM | POA: Diagnosis not present

## 2016-05-20 DIAGNOSIS — Z992 Dependence on renal dialysis: Secondary | ICD-10-CM | POA: Diagnosis not present

## 2016-05-20 DIAGNOSIS — N186 End stage renal disease: Secondary | ICD-10-CM | POA: Diagnosis not present

## 2016-05-22 DIAGNOSIS — Z992 Dependence on renal dialysis: Secondary | ICD-10-CM | POA: Diagnosis not present

## 2016-05-22 DIAGNOSIS — N186 End stage renal disease: Secondary | ICD-10-CM | POA: Diagnosis not present

## 2016-05-22 DIAGNOSIS — D509 Iron deficiency anemia, unspecified: Secondary | ICD-10-CM | POA: Diagnosis not present

## 2016-05-22 DIAGNOSIS — N2581 Secondary hyperparathyroidism of renal origin: Secondary | ICD-10-CM | POA: Diagnosis not present

## 2016-05-22 DIAGNOSIS — D631 Anemia in chronic kidney disease: Secondary | ICD-10-CM | POA: Diagnosis not present

## 2016-05-25 DIAGNOSIS — Z992 Dependence on renal dialysis: Secondary | ICD-10-CM | POA: Diagnosis not present

## 2016-05-25 DIAGNOSIS — D509 Iron deficiency anemia, unspecified: Secondary | ICD-10-CM | POA: Diagnosis not present

## 2016-05-25 DIAGNOSIS — D631 Anemia in chronic kidney disease: Secondary | ICD-10-CM | POA: Diagnosis not present

## 2016-05-25 DIAGNOSIS — N186 End stage renal disease: Secondary | ICD-10-CM | POA: Diagnosis not present

## 2016-05-25 DIAGNOSIS — N2581 Secondary hyperparathyroidism of renal origin: Secondary | ICD-10-CM | POA: Diagnosis not present

## 2016-05-27 DIAGNOSIS — Z992 Dependence on renal dialysis: Secondary | ICD-10-CM | POA: Diagnosis not present

## 2016-05-27 DIAGNOSIS — D631 Anemia in chronic kidney disease: Secondary | ICD-10-CM | POA: Diagnosis not present

## 2016-05-27 DIAGNOSIS — N2581 Secondary hyperparathyroidism of renal origin: Secondary | ICD-10-CM | POA: Diagnosis not present

## 2016-05-27 DIAGNOSIS — N186 End stage renal disease: Secondary | ICD-10-CM | POA: Diagnosis not present

## 2016-05-27 DIAGNOSIS — D509 Iron deficiency anemia, unspecified: Secondary | ICD-10-CM | POA: Diagnosis not present

## 2016-05-29 DIAGNOSIS — D509 Iron deficiency anemia, unspecified: Secondary | ICD-10-CM | POA: Diagnosis not present

## 2016-05-29 DIAGNOSIS — Z992 Dependence on renal dialysis: Secondary | ICD-10-CM | POA: Diagnosis not present

## 2016-05-29 DIAGNOSIS — D631 Anemia in chronic kidney disease: Secondary | ICD-10-CM | POA: Diagnosis not present

## 2016-05-29 DIAGNOSIS — N186 End stage renal disease: Secondary | ICD-10-CM | POA: Diagnosis not present

## 2016-05-29 DIAGNOSIS — N2581 Secondary hyperparathyroidism of renal origin: Secondary | ICD-10-CM | POA: Diagnosis not present

## 2016-06-01 DIAGNOSIS — N186 End stage renal disease: Secondary | ICD-10-CM | POA: Diagnosis not present

## 2016-06-01 DIAGNOSIS — Z992 Dependence on renal dialysis: Secondary | ICD-10-CM | POA: Diagnosis not present

## 2016-06-01 DIAGNOSIS — N2581 Secondary hyperparathyroidism of renal origin: Secondary | ICD-10-CM | POA: Diagnosis not present

## 2016-06-01 DIAGNOSIS — D509 Iron deficiency anemia, unspecified: Secondary | ICD-10-CM | POA: Diagnosis not present

## 2016-06-01 DIAGNOSIS — D631 Anemia in chronic kidney disease: Secondary | ICD-10-CM | POA: Diagnosis not present

## 2016-06-03 DIAGNOSIS — N2581 Secondary hyperparathyroidism of renal origin: Secondary | ICD-10-CM | POA: Diagnosis not present

## 2016-06-03 DIAGNOSIS — D631 Anemia in chronic kidney disease: Secondary | ICD-10-CM | POA: Diagnosis not present

## 2016-06-03 DIAGNOSIS — D509 Iron deficiency anemia, unspecified: Secondary | ICD-10-CM | POA: Diagnosis not present

## 2016-06-03 DIAGNOSIS — Z992 Dependence on renal dialysis: Secondary | ICD-10-CM | POA: Diagnosis not present

## 2016-06-03 DIAGNOSIS — N186 End stage renal disease: Secondary | ICD-10-CM | POA: Diagnosis not present

## 2016-06-05 DIAGNOSIS — Z992 Dependence on renal dialysis: Secondary | ICD-10-CM | POA: Diagnosis not present

## 2016-06-05 DIAGNOSIS — N2581 Secondary hyperparathyroidism of renal origin: Secondary | ICD-10-CM | POA: Diagnosis not present

## 2016-06-05 DIAGNOSIS — N186 End stage renal disease: Secondary | ICD-10-CM | POA: Diagnosis not present

## 2016-06-05 DIAGNOSIS — D509 Iron deficiency anemia, unspecified: Secondary | ICD-10-CM | POA: Diagnosis not present

## 2016-06-05 DIAGNOSIS — D631 Anemia in chronic kidney disease: Secondary | ICD-10-CM | POA: Diagnosis not present

## 2016-06-08 DIAGNOSIS — D509 Iron deficiency anemia, unspecified: Secondary | ICD-10-CM | POA: Diagnosis not present

## 2016-06-08 DIAGNOSIS — N2581 Secondary hyperparathyroidism of renal origin: Secondary | ICD-10-CM | POA: Diagnosis not present

## 2016-06-08 DIAGNOSIS — Z992 Dependence on renal dialysis: Secondary | ICD-10-CM | POA: Diagnosis not present

## 2016-06-08 DIAGNOSIS — N186 End stage renal disease: Secondary | ICD-10-CM | POA: Diagnosis not present

## 2016-06-08 DIAGNOSIS — D631 Anemia in chronic kidney disease: Secondary | ICD-10-CM | POA: Diagnosis not present

## 2016-06-10 DIAGNOSIS — D631 Anemia in chronic kidney disease: Secondary | ICD-10-CM | POA: Diagnosis not present

## 2016-06-10 DIAGNOSIS — D509 Iron deficiency anemia, unspecified: Secondary | ICD-10-CM | POA: Diagnosis not present

## 2016-06-10 DIAGNOSIS — N2581 Secondary hyperparathyroidism of renal origin: Secondary | ICD-10-CM | POA: Diagnosis not present

## 2016-06-10 DIAGNOSIS — Z992 Dependence on renal dialysis: Secondary | ICD-10-CM | POA: Diagnosis not present

## 2016-06-10 DIAGNOSIS — N186 End stage renal disease: Secondary | ICD-10-CM | POA: Diagnosis not present

## 2016-06-12 DIAGNOSIS — Z992 Dependence on renal dialysis: Secondary | ICD-10-CM | POA: Diagnosis not present

## 2016-06-12 DIAGNOSIS — N186 End stage renal disease: Secondary | ICD-10-CM | POA: Diagnosis not present

## 2016-06-12 DIAGNOSIS — D509 Iron deficiency anemia, unspecified: Secondary | ICD-10-CM | POA: Diagnosis not present

## 2016-06-12 DIAGNOSIS — D631 Anemia in chronic kidney disease: Secondary | ICD-10-CM | POA: Diagnosis not present

## 2016-06-12 DIAGNOSIS — N2581 Secondary hyperparathyroidism of renal origin: Secondary | ICD-10-CM | POA: Diagnosis not present

## 2016-06-15 DIAGNOSIS — Z992 Dependence on renal dialysis: Secondary | ICD-10-CM | POA: Diagnosis not present

## 2016-06-15 DIAGNOSIS — D509 Iron deficiency anemia, unspecified: Secondary | ICD-10-CM | POA: Diagnosis not present

## 2016-06-15 DIAGNOSIS — D631 Anemia in chronic kidney disease: Secondary | ICD-10-CM | POA: Diagnosis not present

## 2016-06-15 DIAGNOSIS — N2581 Secondary hyperparathyroidism of renal origin: Secondary | ICD-10-CM | POA: Diagnosis not present

## 2016-06-15 DIAGNOSIS — N186 End stage renal disease: Secondary | ICD-10-CM | POA: Diagnosis not present

## 2016-06-17 DIAGNOSIS — N2581 Secondary hyperparathyroidism of renal origin: Secondary | ICD-10-CM | POA: Diagnosis not present

## 2016-06-17 DIAGNOSIS — D631 Anemia in chronic kidney disease: Secondary | ICD-10-CM | POA: Diagnosis not present

## 2016-06-17 DIAGNOSIS — D509 Iron deficiency anemia, unspecified: Secondary | ICD-10-CM | POA: Diagnosis not present

## 2016-06-17 DIAGNOSIS — Z992 Dependence on renal dialysis: Secondary | ICD-10-CM | POA: Diagnosis not present

## 2016-06-17 DIAGNOSIS — N186 End stage renal disease: Secondary | ICD-10-CM | POA: Diagnosis not present

## 2016-06-19 DIAGNOSIS — Z992 Dependence on renal dialysis: Secondary | ICD-10-CM | POA: Diagnosis not present

## 2016-06-19 DIAGNOSIS — N2581 Secondary hyperparathyroidism of renal origin: Secondary | ICD-10-CM | POA: Diagnosis not present

## 2016-06-19 DIAGNOSIS — D631 Anemia in chronic kidney disease: Secondary | ICD-10-CM | POA: Diagnosis not present

## 2016-06-19 DIAGNOSIS — N186 End stage renal disease: Secondary | ICD-10-CM | POA: Diagnosis not present

## 2016-06-19 DIAGNOSIS — D509 Iron deficiency anemia, unspecified: Secondary | ICD-10-CM | POA: Diagnosis not present

## 2016-06-22 DIAGNOSIS — D631 Anemia in chronic kidney disease: Secondary | ICD-10-CM | POA: Diagnosis not present

## 2016-06-22 DIAGNOSIS — N2581 Secondary hyperparathyroidism of renal origin: Secondary | ICD-10-CM | POA: Diagnosis not present

## 2016-06-22 DIAGNOSIS — N186 End stage renal disease: Secondary | ICD-10-CM | POA: Diagnosis not present

## 2016-06-22 DIAGNOSIS — Z992 Dependence on renal dialysis: Secondary | ICD-10-CM | POA: Diagnosis not present

## 2016-06-22 DIAGNOSIS — D509 Iron deficiency anemia, unspecified: Secondary | ICD-10-CM | POA: Diagnosis not present

## 2016-06-24 DIAGNOSIS — N186 End stage renal disease: Secondary | ICD-10-CM | POA: Diagnosis not present

## 2016-06-24 DIAGNOSIS — Z992 Dependence on renal dialysis: Secondary | ICD-10-CM | POA: Diagnosis not present

## 2016-06-24 DIAGNOSIS — N2581 Secondary hyperparathyroidism of renal origin: Secondary | ICD-10-CM | POA: Diagnosis not present

## 2016-06-24 DIAGNOSIS — D631 Anemia in chronic kidney disease: Secondary | ICD-10-CM | POA: Diagnosis not present

## 2016-06-24 DIAGNOSIS — D509 Iron deficiency anemia, unspecified: Secondary | ICD-10-CM | POA: Diagnosis not present

## 2016-06-26 DIAGNOSIS — N186 End stage renal disease: Secondary | ICD-10-CM | POA: Diagnosis not present

## 2016-06-26 DIAGNOSIS — Z992 Dependence on renal dialysis: Secondary | ICD-10-CM | POA: Diagnosis not present

## 2016-06-26 DIAGNOSIS — D631 Anemia in chronic kidney disease: Secondary | ICD-10-CM | POA: Diagnosis not present

## 2016-06-26 DIAGNOSIS — D509 Iron deficiency anemia, unspecified: Secondary | ICD-10-CM | POA: Diagnosis not present

## 2016-06-26 DIAGNOSIS — N2581 Secondary hyperparathyroidism of renal origin: Secondary | ICD-10-CM | POA: Diagnosis not present

## 2016-06-29 DIAGNOSIS — N2581 Secondary hyperparathyroidism of renal origin: Secondary | ICD-10-CM | POA: Diagnosis not present

## 2016-06-29 DIAGNOSIS — D631 Anemia in chronic kidney disease: Secondary | ICD-10-CM | POA: Diagnosis not present

## 2016-06-29 DIAGNOSIS — Z992 Dependence on renal dialysis: Secondary | ICD-10-CM | POA: Diagnosis not present

## 2016-06-29 DIAGNOSIS — N186 End stage renal disease: Secondary | ICD-10-CM | POA: Diagnosis not present

## 2016-06-29 DIAGNOSIS — D509 Iron deficiency anemia, unspecified: Secondary | ICD-10-CM | POA: Diagnosis not present

## 2016-07-01 DIAGNOSIS — I10 Essential (primary) hypertension: Secondary | ICD-10-CM | POA: Diagnosis not present

## 2016-07-01 DIAGNOSIS — Z992 Dependence on renal dialysis: Secondary | ICD-10-CM | POA: Diagnosis not present

## 2016-07-01 DIAGNOSIS — D509 Iron deficiency anemia, unspecified: Secondary | ICD-10-CM | POA: Diagnosis not present

## 2016-07-01 DIAGNOSIS — N186 End stage renal disease: Secondary | ICD-10-CM | POA: Diagnosis not present

## 2016-07-01 DIAGNOSIS — G3184 Mild cognitive impairment, so stated: Secondary | ICD-10-CM | POA: Diagnosis not present

## 2016-07-01 DIAGNOSIS — D631 Anemia in chronic kidney disease: Secondary | ICD-10-CM | POA: Diagnosis not present

## 2016-07-01 DIAGNOSIS — M79669 Pain in unspecified lower leg: Secondary | ICD-10-CM | POA: Diagnosis not present

## 2016-07-01 DIAGNOSIS — M542 Cervicalgia: Secondary | ICD-10-CM | POA: Diagnosis not present

## 2016-07-01 DIAGNOSIS — N2581 Secondary hyperparathyroidism of renal origin: Secondary | ICD-10-CM | POA: Diagnosis not present

## 2016-07-01 DIAGNOSIS — F0150 Vascular dementia without behavioral disturbance: Secondary | ICD-10-CM | POA: Diagnosis not present

## 2016-07-01 DIAGNOSIS — S0090XS Unspecified superficial injury of unspecified part of head, sequela: Secondary | ICD-10-CM | POA: Diagnosis not present

## 2016-07-01 DIAGNOSIS — R269 Unspecified abnormalities of gait and mobility: Secondary | ICD-10-CM | POA: Diagnosis not present

## 2016-07-01 DIAGNOSIS — M4712 Other spondylosis with myelopathy, cervical region: Secondary | ICD-10-CM | POA: Diagnosis not present

## 2016-07-01 DIAGNOSIS — M130 Polyarthritis, unspecified: Secondary | ICD-10-CM | POA: Diagnosis not present

## 2016-07-03 DIAGNOSIS — D509 Iron deficiency anemia, unspecified: Secondary | ICD-10-CM | POA: Diagnosis not present

## 2016-07-03 DIAGNOSIS — N2581 Secondary hyperparathyroidism of renal origin: Secondary | ICD-10-CM | POA: Diagnosis not present

## 2016-07-03 DIAGNOSIS — D631 Anemia in chronic kidney disease: Secondary | ICD-10-CM | POA: Diagnosis not present

## 2016-07-03 DIAGNOSIS — Z992 Dependence on renal dialysis: Secondary | ICD-10-CM | POA: Diagnosis not present

## 2016-07-03 DIAGNOSIS — N186 End stage renal disease: Secondary | ICD-10-CM | POA: Diagnosis not present

## 2016-07-06 DIAGNOSIS — N2581 Secondary hyperparathyroidism of renal origin: Secondary | ICD-10-CM | POA: Diagnosis not present

## 2016-07-06 DIAGNOSIS — Z992 Dependence on renal dialysis: Secondary | ICD-10-CM | POA: Diagnosis not present

## 2016-07-06 DIAGNOSIS — D631 Anemia in chronic kidney disease: Secondary | ICD-10-CM | POA: Diagnosis not present

## 2016-07-06 DIAGNOSIS — N186 End stage renal disease: Secondary | ICD-10-CM | POA: Diagnosis not present

## 2016-07-06 DIAGNOSIS — D509 Iron deficiency anemia, unspecified: Secondary | ICD-10-CM | POA: Diagnosis not present

## 2016-07-08 DIAGNOSIS — D509 Iron deficiency anemia, unspecified: Secondary | ICD-10-CM | POA: Diagnosis not present

## 2016-07-08 DIAGNOSIS — D631 Anemia in chronic kidney disease: Secondary | ICD-10-CM | POA: Diagnosis not present

## 2016-07-08 DIAGNOSIS — Z992 Dependence on renal dialysis: Secondary | ICD-10-CM | POA: Diagnosis not present

## 2016-07-08 DIAGNOSIS — N2581 Secondary hyperparathyroidism of renal origin: Secondary | ICD-10-CM | POA: Diagnosis not present

## 2016-07-08 DIAGNOSIS — N186 End stage renal disease: Secondary | ICD-10-CM | POA: Diagnosis not present

## 2016-07-13 DIAGNOSIS — R5383 Other fatigue: Secondary | ICD-10-CM | POA: Diagnosis not present

## 2016-07-13 DIAGNOSIS — N186 End stage renal disease: Secondary | ICD-10-CM | POA: Diagnosis not present

## 2016-07-13 DIAGNOSIS — E039 Hypothyroidism, unspecified: Secondary | ICD-10-CM | POA: Diagnosis not present

## 2016-07-13 DIAGNOSIS — D631 Anemia in chronic kidney disease: Secondary | ICD-10-CM | POA: Diagnosis not present

## 2016-07-13 DIAGNOSIS — N2581 Secondary hyperparathyroidism of renal origin: Secondary | ICD-10-CM | POA: Diagnosis not present

## 2016-07-13 DIAGNOSIS — Z992 Dependence on renal dialysis: Secondary | ICD-10-CM | POA: Diagnosis not present

## 2016-07-13 DIAGNOSIS — D509 Iron deficiency anemia, unspecified: Secondary | ICD-10-CM | POA: Diagnosis not present

## 2016-07-15 DIAGNOSIS — D509 Iron deficiency anemia, unspecified: Secondary | ICD-10-CM | POA: Diagnosis not present

## 2016-07-15 DIAGNOSIS — N2581 Secondary hyperparathyroidism of renal origin: Secondary | ICD-10-CM | POA: Diagnosis not present

## 2016-07-15 DIAGNOSIS — Z992 Dependence on renal dialysis: Secondary | ICD-10-CM | POA: Diagnosis not present

## 2016-07-15 DIAGNOSIS — N186 End stage renal disease: Secondary | ICD-10-CM | POA: Diagnosis not present

## 2016-07-15 DIAGNOSIS — D631 Anemia in chronic kidney disease: Secondary | ICD-10-CM | POA: Diagnosis not present

## 2016-07-17 DIAGNOSIS — D631 Anemia in chronic kidney disease: Secondary | ICD-10-CM | POA: Diagnosis not present

## 2016-07-17 DIAGNOSIS — Z992 Dependence on renal dialysis: Secondary | ICD-10-CM | POA: Diagnosis not present

## 2016-07-17 DIAGNOSIS — D509 Iron deficiency anemia, unspecified: Secondary | ICD-10-CM | POA: Diagnosis not present

## 2016-07-17 DIAGNOSIS — N186 End stage renal disease: Secondary | ICD-10-CM | POA: Diagnosis not present

## 2016-07-17 DIAGNOSIS — N2581 Secondary hyperparathyroidism of renal origin: Secondary | ICD-10-CM | POA: Diagnosis not present

## 2016-07-20 DIAGNOSIS — N186 End stage renal disease: Secondary | ICD-10-CM | POA: Diagnosis not present

## 2016-07-20 DIAGNOSIS — N2581 Secondary hyperparathyroidism of renal origin: Secondary | ICD-10-CM | POA: Diagnosis not present

## 2016-07-20 DIAGNOSIS — D631 Anemia in chronic kidney disease: Secondary | ICD-10-CM | POA: Diagnosis not present

## 2016-07-20 DIAGNOSIS — D509 Iron deficiency anemia, unspecified: Secondary | ICD-10-CM | POA: Diagnosis not present

## 2016-07-20 DIAGNOSIS — Z992 Dependence on renal dialysis: Secondary | ICD-10-CM | POA: Diagnosis not present

## 2016-07-22 DIAGNOSIS — Z992 Dependence on renal dialysis: Secondary | ICD-10-CM | POA: Diagnosis not present

## 2016-07-22 DIAGNOSIS — N186 End stage renal disease: Secondary | ICD-10-CM | POA: Diagnosis not present

## 2016-07-22 DIAGNOSIS — D631 Anemia in chronic kidney disease: Secondary | ICD-10-CM | POA: Diagnosis not present

## 2016-07-22 DIAGNOSIS — N2581 Secondary hyperparathyroidism of renal origin: Secondary | ICD-10-CM | POA: Diagnosis not present

## 2016-07-22 DIAGNOSIS — D509 Iron deficiency anemia, unspecified: Secondary | ICD-10-CM | POA: Diagnosis not present

## 2016-07-24 DIAGNOSIS — D509 Iron deficiency anemia, unspecified: Secondary | ICD-10-CM | POA: Diagnosis not present

## 2016-07-24 DIAGNOSIS — D631 Anemia in chronic kidney disease: Secondary | ICD-10-CM | POA: Diagnosis not present

## 2016-07-24 DIAGNOSIS — Z992 Dependence on renal dialysis: Secondary | ICD-10-CM | POA: Diagnosis not present

## 2016-07-24 DIAGNOSIS — N186 End stage renal disease: Secondary | ICD-10-CM | POA: Diagnosis not present

## 2016-07-24 DIAGNOSIS — N2581 Secondary hyperparathyroidism of renal origin: Secondary | ICD-10-CM | POA: Diagnosis not present

## 2016-07-27 DIAGNOSIS — D509 Iron deficiency anemia, unspecified: Secondary | ICD-10-CM | POA: Diagnosis not present

## 2016-07-27 DIAGNOSIS — N2581 Secondary hyperparathyroidism of renal origin: Secondary | ICD-10-CM | POA: Diagnosis not present

## 2016-07-27 DIAGNOSIS — Z992 Dependence on renal dialysis: Secondary | ICD-10-CM | POA: Diagnosis not present

## 2016-07-27 DIAGNOSIS — N186 End stage renal disease: Secondary | ICD-10-CM | POA: Diagnosis not present

## 2016-07-27 DIAGNOSIS — D631 Anemia in chronic kidney disease: Secondary | ICD-10-CM | POA: Diagnosis not present

## 2016-07-29 DIAGNOSIS — N2581 Secondary hyperparathyroidism of renal origin: Secondary | ICD-10-CM | POA: Diagnosis not present

## 2016-07-29 DIAGNOSIS — N186 End stage renal disease: Secondary | ICD-10-CM | POA: Diagnosis not present

## 2016-07-29 DIAGNOSIS — Z992 Dependence on renal dialysis: Secondary | ICD-10-CM | POA: Diagnosis not present

## 2016-07-29 DIAGNOSIS — D631 Anemia in chronic kidney disease: Secondary | ICD-10-CM | POA: Diagnosis not present

## 2016-07-29 DIAGNOSIS — D509 Iron deficiency anemia, unspecified: Secondary | ICD-10-CM | POA: Diagnosis not present

## 2016-07-31 DIAGNOSIS — N186 End stage renal disease: Secondary | ICD-10-CM | POA: Diagnosis not present

## 2016-07-31 DIAGNOSIS — D631 Anemia in chronic kidney disease: Secondary | ICD-10-CM | POA: Diagnosis not present

## 2016-07-31 DIAGNOSIS — Z992 Dependence on renal dialysis: Secondary | ICD-10-CM | POA: Diagnosis not present

## 2016-07-31 DIAGNOSIS — D509 Iron deficiency anemia, unspecified: Secondary | ICD-10-CM | POA: Diagnosis not present

## 2016-07-31 DIAGNOSIS — N2581 Secondary hyperparathyroidism of renal origin: Secondary | ICD-10-CM | POA: Diagnosis not present

## 2016-08-01 DIAGNOSIS — Z992 Dependence on renal dialysis: Secondary | ICD-10-CM | POA: Diagnosis not present

## 2016-08-01 DIAGNOSIS — N186 End stage renal disease: Secondary | ICD-10-CM | POA: Diagnosis not present

## 2016-08-03 DIAGNOSIS — D631 Anemia in chronic kidney disease: Secondary | ICD-10-CM | POA: Diagnosis not present

## 2016-08-03 DIAGNOSIS — Z992 Dependence on renal dialysis: Secondary | ICD-10-CM | POA: Diagnosis not present

## 2016-08-03 DIAGNOSIS — D509 Iron deficiency anemia, unspecified: Secondary | ICD-10-CM | POA: Diagnosis not present

## 2016-08-03 DIAGNOSIS — N2581 Secondary hyperparathyroidism of renal origin: Secondary | ICD-10-CM | POA: Diagnosis not present

## 2016-08-03 DIAGNOSIS — N186 End stage renal disease: Secondary | ICD-10-CM | POA: Diagnosis not present

## 2016-08-05 DIAGNOSIS — D509 Iron deficiency anemia, unspecified: Secondary | ICD-10-CM | POA: Diagnosis not present

## 2016-08-05 DIAGNOSIS — Z992 Dependence on renal dialysis: Secondary | ICD-10-CM | POA: Diagnosis not present

## 2016-08-05 DIAGNOSIS — N186 End stage renal disease: Secondary | ICD-10-CM | POA: Diagnosis not present

## 2016-08-05 DIAGNOSIS — N2581 Secondary hyperparathyroidism of renal origin: Secondary | ICD-10-CM | POA: Diagnosis not present

## 2016-08-05 DIAGNOSIS — D631 Anemia in chronic kidney disease: Secondary | ICD-10-CM | POA: Diagnosis not present

## 2016-08-07 DIAGNOSIS — D509 Iron deficiency anemia, unspecified: Secondary | ICD-10-CM | POA: Diagnosis not present

## 2016-08-07 DIAGNOSIS — N186 End stage renal disease: Secondary | ICD-10-CM | POA: Diagnosis not present

## 2016-08-07 DIAGNOSIS — N2581 Secondary hyperparathyroidism of renal origin: Secondary | ICD-10-CM | POA: Diagnosis not present

## 2016-08-07 DIAGNOSIS — Z992 Dependence on renal dialysis: Secondary | ICD-10-CM | POA: Diagnosis not present

## 2016-08-07 DIAGNOSIS — D631 Anemia in chronic kidney disease: Secondary | ICD-10-CM | POA: Diagnosis not present

## 2016-08-10 DIAGNOSIS — D509 Iron deficiency anemia, unspecified: Secondary | ICD-10-CM | POA: Diagnosis not present

## 2016-08-10 DIAGNOSIS — D631 Anemia in chronic kidney disease: Secondary | ICD-10-CM | POA: Diagnosis not present

## 2016-08-10 DIAGNOSIS — N2581 Secondary hyperparathyroidism of renal origin: Secondary | ICD-10-CM | POA: Diagnosis not present

## 2016-08-10 DIAGNOSIS — N186 End stage renal disease: Secondary | ICD-10-CM | POA: Diagnosis not present

## 2016-08-10 DIAGNOSIS — Z992 Dependence on renal dialysis: Secondary | ICD-10-CM | POA: Diagnosis not present

## 2016-08-11 DIAGNOSIS — G3184 Mild cognitive impairment, so stated: Secondary | ICD-10-CM | POA: Diagnosis not present

## 2016-08-11 DIAGNOSIS — Z79891 Long term (current) use of opiate analgesic: Secondary | ICD-10-CM | POA: Diagnosis not present

## 2016-08-11 DIAGNOSIS — M130 Polyarthritis, unspecified: Secondary | ICD-10-CM | POA: Diagnosis not present

## 2016-08-11 DIAGNOSIS — S0090XS Unspecified superficial injury of unspecified part of head, sequela: Secondary | ICD-10-CM | POA: Diagnosis not present

## 2016-08-11 DIAGNOSIS — M79669 Pain in unspecified lower leg: Secondary | ICD-10-CM | POA: Diagnosis not present

## 2016-08-11 DIAGNOSIS — I10 Essential (primary) hypertension: Secondary | ICD-10-CM | POA: Diagnosis not present

## 2016-08-11 DIAGNOSIS — F0150 Vascular dementia without behavioral disturbance: Secondary | ICD-10-CM | POA: Diagnosis not present

## 2016-08-11 DIAGNOSIS — R269 Unspecified abnormalities of gait and mobility: Secondary | ICD-10-CM | POA: Diagnosis not present

## 2016-08-11 DIAGNOSIS — M79606 Pain in leg, unspecified: Secondary | ICD-10-CM | POA: Diagnosis not present

## 2016-08-11 DIAGNOSIS — M4712 Other spondylosis with myelopathy, cervical region: Secondary | ICD-10-CM | POA: Diagnosis not present

## 2016-08-11 DIAGNOSIS — M542 Cervicalgia: Secondary | ICD-10-CM | POA: Diagnosis not present

## 2016-08-12 DIAGNOSIS — Z992 Dependence on renal dialysis: Secondary | ICD-10-CM | POA: Diagnosis not present

## 2016-08-12 DIAGNOSIS — N186 End stage renal disease: Secondary | ICD-10-CM | POA: Diagnosis not present

## 2016-08-12 DIAGNOSIS — D631 Anemia in chronic kidney disease: Secondary | ICD-10-CM | POA: Diagnosis not present

## 2016-08-12 DIAGNOSIS — N2581 Secondary hyperparathyroidism of renal origin: Secondary | ICD-10-CM | POA: Diagnosis not present

## 2016-08-12 DIAGNOSIS — D509 Iron deficiency anemia, unspecified: Secondary | ICD-10-CM | POA: Diagnosis not present

## 2016-08-14 DIAGNOSIS — N186 End stage renal disease: Secondary | ICD-10-CM | POA: Diagnosis not present

## 2016-08-14 DIAGNOSIS — N2581 Secondary hyperparathyroidism of renal origin: Secondary | ICD-10-CM | POA: Diagnosis not present

## 2016-08-14 DIAGNOSIS — D631 Anemia in chronic kidney disease: Secondary | ICD-10-CM | POA: Diagnosis not present

## 2016-08-14 DIAGNOSIS — Z992 Dependence on renal dialysis: Secondary | ICD-10-CM | POA: Diagnosis not present

## 2016-08-14 DIAGNOSIS — D509 Iron deficiency anemia, unspecified: Secondary | ICD-10-CM | POA: Diagnosis not present

## 2016-08-17 DIAGNOSIS — N2581 Secondary hyperparathyroidism of renal origin: Secondary | ICD-10-CM | POA: Diagnosis not present

## 2016-08-17 DIAGNOSIS — Z992 Dependence on renal dialysis: Secondary | ICD-10-CM | POA: Diagnosis not present

## 2016-08-17 DIAGNOSIS — D631 Anemia in chronic kidney disease: Secondary | ICD-10-CM | POA: Diagnosis not present

## 2016-08-17 DIAGNOSIS — N186 End stage renal disease: Secondary | ICD-10-CM | POA: Diagnosis not present

## 2016-08-17 DIAGNOSIS — D509 Iron deficiency anemia, unspecified: Secondary | ICD-10-CM | POA: Diagnosis not present

## 2016-08-21 DIAGNOSIS — D631 Anemia in chronic kidney disease: Secondary | ICD-10-CM | POA: Diagnosis not present

## 2016-08-21 DIAGNOSIS — N186 End stage renal disease: Secondary | ICD-10-CM | POA: Diagnosis not present

## 2016-08-21 DIAGNOSIS — D509 Iron deficiency anemia, unspecified: Secondary | ICD-10-CM | POA: Diagnosis not present

## 2016-08-21 DIAGNOSIS — N2581 Secondary hyperparathyroidism of renal origin: Secondary | ICD-10-CM | POA: Diagnosis not present

## 2016-08-21 DIAGNOSIS — Z992 Dependence on renal dialysis: Secondary | ICD-10-CM | POA: Diagnosis not present

## 2016-08-24 DIAGNOSIS — D509 Iron deficiency anemia, unspecified: Secondary | ICD-10-CM | POA: Diagnosis not present

## 2016-08-24 DIAGNOSIS — N186 End stage renal disease: Secondary | ICD-10-CM | POA: Diagnosis not present

## 2016-08-24 DIAGNOSIS — Z992 Dependence on renal dialysis: Secondary | ICD-10-CM | POA: Diagnosis not present

## 2016-08-24 DIAGNOSIS — D631 Anemia in chronic kidney disease: Secondary | ICD-10-CM | POA: Diagnosis not present

## 2016-08-24 DIAGNOSIS — N2581 Secondary hyperparathyroidism of renal origin: Secondary | ICD-10-CM | POA: Diagnosis not present

## 2016-08-26 DIAGNOSIS — N2581 Secondary hyperparathyroidism of renal origin: Secondary | ICD-10-CM | POA: Diagnosis not present

## 2016-08-26 DIAGNOSIS — D631 Anemia in chronic kidney disease: Secondary | ICD-10-CM | POA: Diagnosis not present

## 2016-08-26 DIAGNOSIS — D509 Iron deficiency anemia, unspecified: Secondary | ICD-10-CM | POA: Diagnosis not present

## 2016-08-26 DIAGNOSIS — N186 End stage renal disease: Secondary | ICD-10-CM | POA: Diagnosis not present

## 2016-08-26 DIAGNOSIS — Z992 Dependence on renal dialysis: Secondary | ICD-10-CM | POA: Diagnosis not present

## 2016-08-28 DIAGNOSIS — D509 Iron deficiency anemia, unspecified: Secondary | ICD-10-CM | POA: Diagnosis not present

## 2016-08-28 DIAGNOSIS — N186 End stage renal disease: Secondary | ICD-10-CM | POA: Diagnosis not present

## 2016-08-28 DIAGNOSIS — N2581 Secondary hyperparathyroidism of renal origin: Secondary | ICD-10-CM | POA: Diagnosis not present

## 2016-08-28 DIAGNOSIS — Z992 Dependence on renal dialysis: Secondary | ICD-10-CM | POA: Diagnosis not present

## 2016-08-28 DIAGNOSIS — D631 Anemia in chronic kidney disease: Secondary | ICD-10-CM | POA: Diagnosis not present

## 2016-08-31 DIAGNOSIS — D631 Anemia in chronic kidney disease: Secondary | ICD-10-CM | POA: Diagnosis not present

## 2016-08-31 DIAGNOSIS — Z992 Dependence on renal dialysis: Secondary | ICD-10-CM | POA: Diagnosis not present

## 2016-08-31 DIAGNOSIS — N2581 Secondary hyperparathyroidism of renal origin: Secondary | ICD-10-CM | POA: Diagnosis not present

## 2016-08-31 DIAGNOSIS — D509 Iron deficiency anemia, unspecified: Secondary | ICD-10-CM | POA: Diagnosis not present

## 2016-08-31 DIAGNOSIS — N186 End stage renal disease: Secondary | ICD-10-CM | POA: Diagnosis not present

## 2016-09-01 DIAGNOSIS — N186 End stage renal disease: Secondary | ICD-10-CM | POA: Diagnosis not present

## 2016-09-01 DIAGNOSIS — Z992 Dependence on renal dialysis: Secondary | ICD-10-CM | POA: Diagnosis not present

## 2016-09-02 DIAGNOSIS — N186 End stage renal disease: Secondary | ICD-10-CM | POA: Diagnosis not present

## 2016-09-02 DIAGNOSIS — N2581 Secondary hyperparathyroidism of renal origin: Secondary | ICD-10-CM | POA: Diagnosis not present

## 2016-09-02 DIAGNOSIS — D631 Anemia in chronic kidney disease: Secondary | ICD-10-CM | POA: Diagnosis not present

## 2016-09-02 DIAGNOSIS — D509 Iron deficiency anemia, unspecified: Secondary | ICD-10-CM | POA: Diagnosis not present

## 2016-09-02 DIAGNOSIS — Z992 Dependence on renal dialysis: Secondary | ICD-10-CM | POA: Diagnosis not present

## 2016-09-04 DIAGNOSIS — D509 Iron deficiency anemia, unspecified: Secondary | ICD-10-CM | POA: Diagnosis not present

## 2016-09-04 DIAGNOSIS — D631 Anemia in chronic kidney disease: Secondary | ICD-10-CM | POA: Diagnosis not present

## 2016-09-04 DIAGNOSIS — N2581 Secondary hyperparathyroidism of renal origin: Secondary | ICD-10-CM | POA: Diagnosis not present

## 2016-09-04 DIAGNOSIS — Z992 Dependence on renal dialysis: Secondary | ICD-10-CM | POA: Diagnosis not present

## 2016-09-04 DIAGNOSIS — N186 End stage renal disease: Secondary | ICD-10-CM | POA: Diagnosis not present

## 2016-09-07 DIAGNOSIS — N2581 Secondary hyperparathyroidism of renal origin: Secondary | ICD-10-CM | POA: Diagnosis not present

## 2016-09-07 DIAGNOSIS — Z992 Dependence on renal dialysis: Secondary | ICD-10-CM | POA: Diagnosis not present

## 2016-09-07 DIAGNOSIS — D509 Iron deficiency anemia, unspecified: Secondary | ICD-10-CM | POA: Diagnosis not present

## 2016-09-07 DIAGNOSIS — N186 End stage renal disease: Secondary | ICD-10-CM | POA: Diagnosis not present

## 2016-09-07 DIAGNOSIS — D631 Anemia in chronic kidney disease: Secondary | ICD-10-CM | POA: Diagnosis not present

## 2016-09-09 DIAGNOSIS — Z79891 Long term (current) use of opiate analgesic: Secondary | ICD-10-CM | POA: Diagnosis not present

## 2016-09-09 DIAGNOSIS — R269 Unspecified abnormalities of gait and mobility: Secondary | ICD-10-CM | POA: Diagnosis not present

## 2016-09-09 DIAGNOSIS — G3184 Mild cognitive impairment, so stated: Secondary | ICD-10-CM | POA: Diagnosis not present

## 2016-09-09 DIAGNOSIS — S0090XS Unspecified superficial injury of unspecified part of head, sequela: Secondary | ICD-10-CM | POA: Diagnosis not present

## 2016-09-09 DIAGNOSIS — N2581 Secondary hyperparathyroidism of renal origin: Secondary | ICD-10-CM | POA: Diagnosis not present

## 2016-09-09 DIAGNOSIS — G603 Idiopathic progressive neuropathy: Secondary | ICD-10-CM | POA: Diagnosis not present

## 2016-09-09 DIAGNOSIS — M4712 Other spondylosis with myelopathy, cervical region: Secondary | ICD-10-CM | POA: Diagnosis not present

## 2016-09-09 DIAGNOSIS — Z992 Dependence on renal dialysis: Secondary | ICD-10-CM | POA: Diagnosis not present

## 2016-09-09 DIAGNOSIS — D631 Anemia in chronic kidney disease: Secondary | ICD-10-CM | POA: Diagnosis not present

## 2016-09-09 DIAGNOSIS — M542 Cervicalgia: Secondary | ICD-10-CM | POA: Diagnosis not present

## 2016-09-09 DIAGNOSIS — I10 Essential (primary) hypertension: Secondary | ICD-10-CM | POA: Diagnosis not present

## 2016-09-09 DIAGNOSIS — N186 End stage renal disease: Secondary | ICD-10-CM | POA: Diagnosis not present

## 2016-09-09 DIAGNOSIS — D509 Iron deficiency anemia, unspecified: Secondary | ICD-10-CM | POA: Diagnosis not present

## 2016-09-09 DIAGNOSIS — F0150 Vascular dementia without behavioral disturbance: Secondary | ICD-10-CM | POA: Diagnosis not present

## 2016-09-09 DIAGNOSIS — M130 Polyarthritis, unspecified: Secondary | ICD-10-CM | POA: Diagnosis not present

## 2016-09-11 DIAGNOSIS — N186 End stage renal disease: Secondary | ICD-10-CM | POA: Diagnosis not present

## 2016-09-11 DIAGNOSIS — N2581 Secondary hyperparathyroidism of renal origin: Secondary | ICD-10-CM | POA: Diagnosis not present

## 2016-09-11 DIAGNOSIS — D631 Anemia in chronic kidney disease: Secondary | ICD-10-CM | POA: Diagnosis not present

## 2016-09-11 DIAGNOSIS — Z992 Dependence on renal dialysis: Secondary | ICD-10-CM | POA: Diagnosis not present

## 2016-09-11 DIAGNOSIS — D509 Iron deficiency anemia, unspecified: Secondary | ICD-10-CM | POA: Diagnosis not present

## 2016-09-14 DIAGNOSIS — D631 Anemia in chronic kidney disease: Secondary | ICD-10-CM | POA: Diagnosis not present

## 2016-09-14 DIAGNOSIS — Z992 Dependence on renal dialysis: Secondary | ICD-10-CM | POA: Diagnosis not present

## 2016-09-14 DIAGNOSIS — D509 Iron deficiency anemia, unspecified: Secondary | ICD-10-CM | POA: Diagnosis not present

## 2016-09-14 DIAGNOSIS — N2581 Secondary hyperparathyroidism of renal origin: Secondary | ICD-10-CM | POA: Diagnosis not present

## 2016-09-14 DIAGNOSIS — N186 End stage renal disease: Secondary | ICD-10-CM | POA: Diagnosis not present

## 2016-09-16 DIAGNOSIS — N186 End stage renal disease: Secondary | ICD-10-CM | POA: Diagnosis not present

## 2016-09-16 DIAGNOSIS — Z992 Dependence on renal dialysis: Secondary | ICD-10-CM | POA: Diagnosis not present

## 2016-09-16 DIAGNOSIS — N2581 Secondary hyperparathyroidism of renal origin: Secondary | ICD-10-CM | POA: Diagnosis not present

## 2016-09-16 DIAGNOSIS — D631 Anemia in chronic kidney disease: Secondary | ICD-10-CM | POA: Diagnosis not present

## 2016-09-16 DIAGNOSIS — D509 Iron deficiency anemia, unspecified: Secondary | ICD-10-CM | POA: Diagnosis not present

## 2016-09-18 DIAGNOSIS — N2581 Secondary hyperparathyroidism of renal origin: Secondary | ICD-10-CM | POA: Diagnosis not present

## 2016-09-18 DIAGNOSIS — Z992 Dependence on renal dialysis: Secondary | ICD-10-CM | POA: Diagnosis not present

## 2016-09-18 DIAGNOSIS — N186 End stage renal disease: Secondary | ICD-10-CM | POA: Diagnosis not present

## 2016-09-18 DIAGNOSIS — D509 Iron deficiency anemia, unspecified: Secondary | ICD-10-CM | POA: Diagnosis not present

## 2016-09-18 DIAGNOSIS — D631 Anemia in chronic kidney disease: Secondary | ICD-10-CM | POA: Diagnosis not present

## 2016-09-21 DIAGNOSIS — Z992 Dependence on renal dialysis: Secondary | ICD-10-CM | POA: Diagnosis not present

## 2016-09-21 DIAGNOSIS — D631 Anemia in chronic kidney disease: Secondary | ICD-10-CM | POA: Diagnosis not present

## 2016-09-21 DIAGNOSIS — D509 Iron deficiency anemia, unspecified: Secondary | ICD-10-CM | POA: Diagnosis not present

## 2016-09-21 DIAGNOSIS — N2581 Secondary hyperparathyroidism of renal origin: Secondary | ICD-10-CM | POA: Diagnosis not present

## 2016-09-21 DIAGNOSIS — N186 End stage renal disease: Secondary | ICD-10-CM | POA: Diagnosis not present

## 2016-09-23 DIAGNOSIS — N2581 Secondary hyperparathyroidism of renal origin: Secondary | ICD-10-CM | POA: Diagnosis not present

## 2016-09-23 DIAGNOSIS — D509 Iron deficiency anemia, unspecified: Secondary | ICD-10-CM | POA: Diagnosis not present

## 2016-09-23 DIAGNOSIS — Z992 Dependence on renal dialysis: Secondary | ICD-10-CM | POA: Diagnosis not present

## 2016-09-23 DIAGNOSIS — N186 End stage renal disease: Secondary | ICD-10-CM | POA: Diagnosis not present

## 2016-09-23 DIAGNOSIS — D631 Anemia in chronic kidney disease: Secondary | ICD-10-CM | POA: Diagnosis not present

## 2016-09-25 DIAGNOSIS — D631 Anemia in chronic kidney disease: Secondary | ICD-10-CM | POA: Diagnosis not present

## 2016-09-25 DIAGNOSIS — N186 End stage renal disease: Secondary | ICD-10-CM | POA: Diagnosis not present

## 2016-09-25 DIAGNOSIS — N2581 Secondary hyperparathyroidism of renal origin: Secondary | ICD-10-CM | POA: Diagnosis not present

## 2016-09-25 DIAGNOSIS — D509 Iron deficiency anemia, unspecified: Secondary | ICD-10-CM | POA: Diagnosis not present

## 2016-09-25 DIAGNOSIS — Z992 Dependence on renal dialysis: Secondary | ICD-10-CM | POA: Diagnosis not present

## 2016-09-28 DIAGNOSIS — D631 Anemia in chronic kidney disease: Secondary | ICD-10-CM | POA: Diagnosis not present

## 2016-09-28 DIAGNOSIS — Z992 Dependence on renal dialysis: Secondary | ICD-10-CM | POA: Diagnosis not present

## 2016-09-28 DIAGNOSIS — N186 End stage renal disease: Secondary | ICD-10-CM | POA: Diagnosis not present

## 2016-09-28 DIAGNOSIS — D509 Iron deficiency anemia, unspecified: Secondary | ICD-10-CM | POA: Diagnosis not present

## 2016-09-28 DIAGNOSIS — N2581 Secondary hyperparathyroidism of renal origin: Secondary | ICD-10-CM | POA: Diagnosis not present

## 2016-09-29 DIAGNOSIS — N186 End stage renal disease: Secondary | ICD-10-CM | POA: Diagnosis not present

## 2016-09-29 DIAGNOSIS — Z992 Dependence on renal dialysis: Secondary | ICD-10-CM | POA: Diagnosis not present

## 2016-09-30 DIAGNOSIS — Z992 Dependence on renal dialysis: Secondary | ICD-10-CM | POA: Diagnosis not present

## 2016-09-30 DIAGNOSIS — D509 Iron deficiency anemia, unspecified: Secondary | ICD-10-CM | POA: Diagnosis not present

## 2016-09-30 DIAGNOSIS — N186 End stage renal disease: Secondary | ICD-10-CM | POA: Diagnosis not present

## 2016-09-30 DIAGNOSIS — N2581 Secondary hyperparathyroidism of renal origin: Secondary | ICD-10-CM | POA: Diagnosis not present

## 2016-10-02 DIAGNOSIS — Z992 Dependence on renal dialysis: Secondary | ICD-10-CM | POA: Diagnosis not present

## 2016-10-02 DIAGNOSIS — D509 Iron deficiency anemia, unspecified: Secondary | ICD-10-CM | POA: Diagnosis not present

## 2016-10-02 DIAGNOSIS — N186 End stage renal disease: Secondary | ICD-10-CM | POA: Diagnosis not present

## 2016-10-02 DIAGNOSIS — N2581 Secondary hyperparathyroidism of renal origin: Secondary | ICD-10-CM | POA: Diagnosis not present

## 2016-10-05 DIAGNOSIS — Z992 Dependence on renal dialysis: Secondary | ICD-10-CM | POA: Diagnosis not present

## 2016-10-05 DIAGNOSIS — N2581 Secondary hyperparathyroidism of renal origin: Secondary | ICD-10-CM | POA: Diagnosis not present

## 2016-10-05 DIAGNOSIS — D509 Iron deficiency anemia, unspecified: Secondary | ICD-10-CM | POA: Diagnosis not present

## 2016-10-05 DIAGNOSIS — N186 End stage renal disease: Secondary | ICD-10-CM | POA: Diagnosis not present

## 2016-10-07 DIAGNOSIS — Z992 Dependence on renal dialysis: Secondary | ICD-10-CM | POA: Diagnosis not present

## 2016-10-07 DIAGNOSIS — N186 End stage renal disease: Secondary | ICD-10-CM | POA: Diagnosis not present

## 2016-10-07 DIAGNOSIS — N2581 Secondary hyperparathyroidism of renal origin: Secondary | ICD-10-CM | POA: Diagnosis not present

## 2016-10-07 DIAGNOSIS — D509 Iron deficiency anemia, unspecified: Secondary | ICD-10-CM | POA: Diagnosis not present

## 2016-10-09 DIAGNOSIS — N2581 Secondary hyperparathyroidism of renal origin: Secondary | ICD-10-CM | POA: Diagnosis not present

## 2016-10-09 DIAGNOSIS — N186 End stage renal disease: Secondary | ICD-10-CM | POA: Diagnosis not present

## 2016-10-09 DIAGNOSIS — D509 Iron deficiency anemia, unspecified: Secondary | ICD-10-CM | POA: Diagnosis not present

## 2016-10-09 DIAGNOSIS — Z992 Dependence on renal dialysis: Secondary | ICD-10-CM | POA: Diagnosis not present

## 2016-10-12 DIAGNOSIS — D509 Iron deficiency anemia, unspecified: Secondary | ICD-10-CM | POA: Diagnosis not present

## 2016-10-12 DIAGNOSIS — N2581 Secondary hyperparathyroidism of renal origin: Secondary | ICD-10-CM | POA: Diagnosis not present

## 2016-10-12 DIAGNOSIS — N186 End stage renal disease: Secondary | ICD-10-CM | POA: Diagnosis not present

## 2016-10-12 DIAGNOSIS — Z992 Dependence on renal dialysis: Secondary | ICD-10-CM | POA: Diagnosis not present

## 2016-10-14 DIAGNOSIS — N2581 Secondary hyperparathyroidism of renal origin: Secondary | ICD-10-CM | POA: Diagnosis not present

## 2016-10-14 DIAGNOSIS — Z992 Dependence on renal dialysis: Secondary | ICD-10-CM | POA: Diagnosis not present

## 2016-10-14 DIAGNOSIS — D509 Iron deficiency anemia, unspecified: Secondary | ICD-10-CM | POA: Diagnosis not present

## 2016-10-14 DIAGNOSIS — N186 End stage renal disease: Secondary | ICD-10-CM | POA: Diagnosis not present

## 2016-10-16 DIAGNOSIS — N186 End stage renal disease: Secondary | ICD-10-CM | POA: Diagnosis not present

## 2016-10-16 DIAGNOSIS — D509 Iron deficiency anemia, unspecified: Secondary | ICD-10-CM | POA: Diagnosis not present

## 2016-10-16 DIAGNOSIS — N2581 Secondary hyperparathyroidism of renal origin: Secondary | ICD-10-CM | POA: Diagnosis not present

## 2016-10-16 DIAGNOSIS — Z992 Dependence on renal dialysis: Secondary | ICD-10-CM | POA: Diagnosis not present

## 2016-10-19 DIAGNOSIS — N2581 Secondary hyperparathyroidism of renal origin: Secondary | ICD-10-CM | POA: Diagnosis not present

## 2016-10-19 DIAGNOSIS — D509 Iron deficiency anemia, unspecified: Secondary | ICD-10-CM | POA: Diagnosis not present

## 2016-10-19 DIAGNOSIS — Z992 Dependence on renal dialysis: Secondary | ICD-10-CM | POA: Diagnosis not present

## 2016-10-19 DIAGNOSIS — N186 End stage renal disease: Secondary | ICD-10-CM | POA: Diagnosis not present

## 2016-10-21 DIAGNOSIS — Z992 Dependence on renal dialysis: Secondary | ICD-10-CM | POA: Diagnosis not present

## 2016-10-21 DIAGNOSIS — D509 Iron deficiency anemia, unspecified: Secondary | ICD-10-CM | POA: Diagnosis not present

## 2016-10-21 DIAGNOSIS — N186 End stage renal disease: Secondary | ICD-10-CM | POA: Diagnosis not present

## 2016-10-21 DIAGNOSIS — N2581 Secondary hyperparathyroidism of renal origin: Secondary | ICD-10-CM | POA: Diagnosis not present

## 2016-10-23 DIAGNOSIS — N2581 Secondary hyperparathyroidism of renal origin: Secondary | ICD-10-CM | POA: Diagnosis not present

## 2016-10-23 DIAGNOSIS — N186 End stage renal disease: Secondary | ICD-10-CM | POA: Diagnosis not present

## 2016-10-23 DIAGNOSIS — Z992 Dependence on renal dialysis: Secondary | ICD-10-CM | POA: Diagnosis not present

## 2016-10-23 DIAGNOSIS — D509 Iron deficiency anemia, unspecified: Secondary | ICD-10-CM | POA: Diagnosis not present

## 2016-10-26 DIAGNOSIS — D509 Iron deficiency anemia, unspecified: Secondary | ICD-10-CM | POA: Diagnosis not present

## 2016-10-26 DIAGNOSIS — Z992 Dependence on renal dialysis: Secondary | ICD-10-CM | POA: Diagnosis not present

## 2016-10-26 DIAGNOSIS — N2581 Secondary hyperparathyroidism of renal origin: Secondary | ICD-10-CM | POA: Diagnosis not present

## 2016-10-26 DIAGNOSIS — N186 End stage renal disease: Secondary | ICD-10-CM | POA: Diagnosis not present

## 2016-10-28 DIAGNOSIS — N186 End stage renal disease: Secondary | ICD-10-CM | POA: Diagnosis not present

## 2016-10-28 DIAGNOSIS — Z992 Dependence on renal dialysis: Secondary | ICD-10-CM | POA: Diagnosis not present

## 2016-10-28 DIAGNOSIS — D509 Iron deficiency anemia, unspecified: Secondary | ICD-10-CM | POA: Diagnosis not present

## 2016-10-28 DIAGNOSIS — N2581 Secondary hyperparathyroidism of renal origin: Secondary | ICD-10-CM | POA: Diagnosis not present

## 2016-10-30 DIAGNOSIS — N186 End stage renal disease: Secondary | ICD-10-CM | POA: Diagnosis not present

## 2016-10-30 DIAGNOSIS — D509 Iron deficiency anemia, unspecified: Secondary | ICD-10-CM | POA: Diagnosis not present

## 2016-10-30 DIAGNOSIS — N2581 Secondary hyperparathyroidism of renal origin: Secondary | ICD-10-CM | POA: Diagnosis not present

## 2016-10-30 DIAGNOSIS — Z992 Dependence on renal dialysis: Secondary | ICD-10-CM | POA: Diagnosis not present

## 2016-10-31 DIAGNOSIS — D631 Anemia in chronic kidney disease: Secondary | ICD-10-CM | POA: Diagnosis not present

## 2016-10-31 DIAGNOSIS — D509 Iron deficiency anemia, unspecified: Secondary | ICD-10-CM | POA: Diagnosis not present

## 2016-10-31 DIAGNOSIS — N2581 Secondary hyperparathyroidism of renal origin: Secondary | ICD-10-CM | POA: Diagnosis not present

## 2016-10-31 DIAGNOSIS — N186 End stage renal disease: Secondary | ICD-10-CM | POA: Diagnosis not present

## 2016-10-31 DIAGNOSIS — Z992 Dependence on renal dialysis: Secondary | ICD-10-CM | POA: Diagnosis not present

## 2016-10-31 DIAGNOSIS — R112 Nausea with vomiting, unspecified: Secondary | ICD-10-CM | POA: Diagnosis not present

## 2016-11-02 DIAGNOSIS — R112 Nausea with vomiting, unspecified: Secondary | ICD-10-CM | POA: Diagnosis not present

## 2016-11-02 DIAGNOSIS — N2581 Secondary hyperparathyroidism of renal origin: Secondary | ICD-10-CM | POA: Diagnosis not present

## 2016-11-02 DIAGNOSIS — Z992 Dependence on renal dialysis: Secondary | ICD-10-CM | POA: Diagnosis not present

## 2016-11-02 DIAGNOSIS — N186 End stage renal disease: Secondary | ICD-10-CM | POA: Diagnosis not present

## 2016-11-02 DIAGNOSIS — D509 Iron deficiency anemia, unspecified: Secondary | ICD-10-CM | POA: Diagnosis not present

## 2016-11-02 DIAGNOSIS — D631 Anemia in chronic kidney disease: Secondary | ICD-10-CM | POA: Diagnosis not present

## 2016-11-04 DIAGNOSIS — N186 End stage renal disease: Secondary | ICD-10-CM | POA: Diagnosis not present

## 2016-11-04 DIAGNOSIS — D631 Anemia in chronic kidney disease: Secondary | ICD-10-CM | POA: Diagnosis not present

## 2016-11-04 DIAGNOSIS — D509 Iron deficiency anemia, unspecified: Secondary | ICD-10-CM | POA: Diagnosis not present

## 2016-11-04 DIAGNOSIS — N2581 Secondary hyperparathyroidism of renal origin: Secondary | ICD-10-CM | POA: Diagnosis not present

## 2016-11-04 DIAGNOSIS — R112 Nausea with vomiting, unspecified: Secondary | ICD-10-CM | POA: Diagnosis not present

## 2016-11-04 DIAGNOSIS — Z992 Dependence on renal dialysis: Secondary | ICD-10-CM | POA: Diagnosis not present

## 2016-11-06 DIAGNOSIS — N2581 Secondary hyperparathyroidism of renal origin: Secondary | ICD-10-CM | POA: Diagnosis not present

## 2016-11-06 DIAGNOSIS — Z992 Dependence on renal dialysis: Secondary | ICD-10-CM | POA: Diagnosis not present

## 2016-11-06 DIAGNOSIS — N186 End stage renal disease: Secondary | ICD-10-CM | POA: Diagnosis not present

## 2016-11-06 DIAGNOSIS — R112 Nausea with vomiting, unspecified: Secondary | ICD-10-CM | POA: Diagnosis not present

## 2016-11-06 DIAGNOSIS — D631 Anemia in chronic kidney disease: Secondary | ICD-10-CM | POA: Diagnosis not present

## 2016-11-06 DIAGNOSIS — D509 Iron deficiency anemia, unspecified: Secondary | ICD-10-CM | POA: Diagnosis not present

## 2016-11-09 DIAGNOSIS — Z992 Dependence on renal dialysis: Secondary | ICD-10-CM | POA: Diagnosis not present

## 2016-11-09 DIAGNOSIS — D631 Anemia in chronic kidney disease: Secondary | ICD-10-CM | POA: Diagnosis not present

## 2016-11-09 DIAGNOSIS — D509 Iron deficiency anemia, unspecified: Secondary | ICD-10-CM | POA: Diagnosis not present

## 2016-11-09 DIAGNOSIS — R112 Nausea with vomiting, unspecified: Secondary | ICD-10-CM | POA: Diagnosis not present

## 2016-11-09 DIAGNOSIS — N2581 Secondary hyperparathyroidism of renal origin: Secondary | ICD-10-CM | POA: Diagnosis not present

## 2016-11-09 DIAGNOSIS — N186 End stage renal disease: Secondary | ICD-10-CM | POA: Diagnosis not present

## 2016-11-10 ENCOUNTER — Ambulatory Visit: Payer: Medicare Other | Admitting: Gastroenterology

## 2016-11-11 DIAGNOSIS — Z992 Dependence on renal dialysis: Secondary | ICD-10-CM | POA: Diagnosis not present

## 2016-11-11 DIAGNOSIS — N2581 Secondary hyperparathyroidism of renal origin: Secondary | ICD-10-CM | POA: Diagnosis not present

## 2016-11-11 DIAGNOSIS — D509 Iron deficiency anemia, unspecified: Secondary | ICD-10-CM | POA: Diagnosis not present

## 2016-11-11 DIAGNOSIS — R112 Nausea with vomiting, unspecified: Secondary | ICD-10-CM | POA: Diagnosis not present

## 2016-11-11 DIAGNOSIS — N186 End stage renal disease: Secondary | ICD-10-CM | POA: Diagnosis not present

## 2016-11-11 DIAGNOSIS — D631 Anemia in chronic kidney disease: Secondary | ICD-10-CM | POA: Diagnosis not present

## 2016-11-13 DIAGNOSIS — Z992 Dependence on renal dialysis: Secondary | ICD-10-CM | POA: Diagnosis not present

## 2016-11-13 DIAGNOSIS — N2581 Secondary hyperparathyroidism of renal origin: Secondary | ICD-10-CM | POA: Diagnosis not present

## 2016-11-13 DIAGNOSIS — D631 Anemia in chronic kidney disease: Secondary | ICD-10-CM | POA: Diagnosis not present

## 2016-11-13 DIAGNOSIS — D509 Iron deficiency anemia, unspecified: Secondary | ICD-10-CM | POA: Diagnosis not present

## 2016-11-13 DIAGNOSIS — R112 Nausea with vomiting, unspecified: Secondary | ICD-10-CM | POA: Diagnosis not present

## 2016-11-13 DIAGNOSIS — N186 End stage renal disease: Secondary | ICD-10-CM | POA: Diagnosis not present

## 2016-11-16 DIAGNOSIS — D509 Iron deficiency anemia, unspecified: Secondary | ICD-10-CM | POA: Diagnosis not present

## 2016-11-16 DIAGNOSIS — N186 End stage renal disease: Secondary | ICD-10-CM | POA: Diagnosis not present

## 2016-11-16 DIAGNOSIS — Z992 Dependence on renal dialysis: Secondary | ICD-10-CM | POA: Diagnosis not present

## 2016-11-16 DIAGNOSIS — N2581 Secondary hyperparathyroidism of renal origin: Secondary | ICD-10-CM | POA: Diagnosis not present

## 2016-11-16 DIAGNOSIS — R112 Nausea with vomiting, unspecified: Secondary | ICD-10-CM | POA: Diagnosis not present

## 2016-11-16 DIAGNOSIS — D631 Anemia in chronic kidney disease: Secondary | ICD-10-CM | POA: Diagnosis not present

## 2016-11-18 DIAGNOSIS — N186 End stage renal disease: Secondary | ICD-10-CM | POA: Diagnosis not present

## 2016-11-18 DIAGNOSIS — N2581 Secondary hyperparathyroidism of renal origin: Secondary | ICD-10-CM | POA: Diagnosis not present

## 2016-11-18 DIAGNOSIS — Z992 Dependence on renal dialysis: Secondary | ICD-10-CM | POA: Diagnosis not present

## 2016-11-18 DIAGNOSIS — D631 Anemia in chronic kidney disease: Secondary | ICD-10-CM | POA: Diagnosis not present

## 2016-11-18 DIAGNOSIS — R112 Nausea with vomiting, unspecified: Secondary | ICD-10-CM | POA: Diagnosis not present

## 2016-11-18 DIAGNOSIS — D509 Iron deficiency anemia, unspecified: Secondary | ICD-10-CM | POA: Diagnosis not present

## 2016-11-19 ENCOUNTER — Emergency Department (HOSPITAL_COMMUNITY): Payer: Medicare Other

## 2016-11-19 ENCOUNTER — Encounter (HOSPITAL_COMMUNITY): Payer: Self-pay | Admitting: Emergency Medicine

## 2016-11-19 ENCOUNTER — Inpatient Hospital Stay (HOSPITAL_COMMUNITY)
Admission: EM | Admit: 2016-11-19 | Discharge: 2016-11-20 | DRG: 391 | Disposition: A | Discharge status: Home / Self Care | Payer: Medicare Other | Attending: Internal Medicine | Admitting: Internal Medicine

## 2016-11-19 ENCOUNTER — Inpatient Hospital Stay (HOSPITAL_COMMUNITY): Payer: Medicare Other

## 2016-11-19 DIAGNOSIS — R262 Difficulty in walking, not elsewhere classified: Secondary | ICD-10-CM | POA: Diagnosis present

## 2016-11-19 DIAGNOSIS — R34 Anuria and oliguria: Secondary | ICD-10-CM | POA: Diagnosis present

## 2016-11-19 DIAGNOSIS — Z79899 Other long term (current) drug therapy: Secondary | ICD-10-CM | POA: Diagnosis not present

## 2016-11-19 DIAGNOSIS — R200 Anesthesia of skin: Secondary | ICD-10-CM | POA: Diagnosis present

## 2016-11-19 DIAGNOSIS — K5900 Constipation, unspecified: Secondary | ICD-10-CM | POA: Diagnosis present

## 2016-11-19 DIAGNOSIS — N2581 Secondary hyperparathyroidism of renal origin: Secondary | ICD-10-CM | POA: Diagnosis present

## 2016-11-19 DIAGNOSIS — N186 End stage renal disease: Secondary | ICD-10-CM | POA: Diagnosis not present

## 2016-11-19 DIAGNOSIS — K6389 Other specified diseases of intestine: Secondary | ICD-10-CM | POA: Diagnosis not present

## 2016-11-19 DIAGNOSIS — Z8249 Family history of ischemic heart disease and other diseases of the circulatory system: Secondary | ICD-10-CM

## 2016-11-19 DIAGNOSIS — K529 Noninfective gastroenteritis and colitis, unspecified: Secondary | ICD-10-CM | POA: Diagnosis not present

## 2016-11-19 DIAGNOSIS — K222 Esophageal obstruction: Secondary | ICD-10-CM | POA: Diagnosis present

## 2016-11-19 DIAGNOSIS — R209 Unspecified disturbances of skin sensation: Secondary | ICD-10-CM

## 2016-11-19 DIAGNOSIS — I160 Hypertensive urgency: Secondary | ICD-10-CM | POA: Diagnosis not present

## 2016-11-19 DIAGNOSIS — Z9049 Acquired absence of other specified parts of digestive tract: Secondary | ICD-10-CM

## 2016-11-19 DIAGNOSIS — Z7983 Long term (current) use of bisphosphonates: Secondary | ICD-10-CM

## 2016-11-19 DIAGNOSIS — Z90710 Acquired absence of both cervix and uterus: Secondary | ICD-10-CM

## 2016-11-19 DIAGNOSIS — I517 Cardiomegaly: Secondary | ICD-10-CM | POA: Diagnosis present

## 2016-11-19 DIAGNOSIS — M109 Gout, unspecified: Secondary | ICD-10-CM | POA: Diagnosis present

## 2016-11-19 DIAGNOSIS — R112 Nausea with vomiting, unspecified: Secondary | ICD-10-CM | POA: Diagnosis not present

## 2016-11-19 DIAGNOSIS — E876 Hypokalemia: Secondary | ICD-10-CM | POA: Diagnosis not present

## 2016-11-19 DIAGNOSIS — Z8542 Personal history of malignant neoplasm of other parts of uterus: Secondary | ICD-10-CM

## 2016-11-19 DIAGNOSIS — I4581 Long QT syndrome: Secondary | ICD-10-CM | POA: Diagnosis present

## 2016-11-19 DIAGNOSIS — Z992 Dependence on renal dialysis: Secondary | ICD-10-CM | POA: Diagnosis not present

## 2016-11-19 DIAGNOSIS — I120 Hypertensive chronic kidney disease with stage 5 chronic kidney disease or end stage renal disease: Secondary | ICD-10-CM | POA: Diagnosis not present

## 2016-11-19 DIAGNOSIS — Q018 Encephalocele of other sites: Secondary | ICD-10-CM | POA: Diagnosis not present

## 2016-11-19 DIAGNOSIS — E8889 Other specified metabolic disorders: Secondary | ICD-10-CM | POA: Diagnosis present

## 2016-11-19 DIAGNOSIS — K219 Gastro-esophageal reflux disease without esophagitis: Secondary | ICD-10-CM | POA: Diagnosis present

## 2016-11-19 DIAGNOSIS — Z881 Allergy status to other antibiotic agents status: Secondary | ICD-10-CM | POA: Diagnosis not present

## 2016-11-19 DIAGNOSIS — R531 Weakness: Secondary | ICD-10-CM

## 2016-11-19 DIAGNOSIS — E039 Hypothyroidism, unspecified: Secondary | ICD-10-CM | POA: Diagnosis present

## 2016-11-19 DIAGNOSIS — Q019 Encephalocele, unspecified: Secondary | ICD-10-CM

## 2016-11-19 DIAGNOSIS — Z833 Family history of diabetes mellitus: Secondary | ICD-10-CM | POA: Diagnosis not present

## 2016-11-19 DIAGNOSIS — Z8673 Personal history of transient ischemic attack (TIA), and cerebral infarction without residual deficits: Secondary | ICD-10-CM

## 2016-11-19 DIAGNOSIS — I10 Essential (primary) hypertension: Secondary | ICD-10-CM | POA: Diagnosis not present

## 2016-11-19 HISTORY — DX: Cerebral infarction, unspecified: I63.9

## 2016-11-19 LAB — CBC WITH DIFFERENTIAL/PLATELET
Basophils Absolute: 0 10*3/uL (ref 0.0–0.1)
Basophils Relative: 0 %
EOS PCT: 1 %
Eosinophils Absolute: 0 10*3/uL (ref 0.0–0.7)
HEMATOCRIT: 40.6 % (ref 36.0–46.0)
Hemoglobin: 13.1 g/dL (ref 12.0–15.0)
LYMPHS ABS: 1.5 10*3/uL (ref 0.7–4.0)
LYMPHS PCT: 28 %
MCH: 25.2 pg — ABNORMAL LOW (ref 26.0–34.0)
MCHC: 32.3 g/dL (ref 30.0–36.0)
MCV: 78.2 fL (ref 78.0–100.0)
Monocytes Absolute: 0.4 10*3/uL (ref 0.1–1.0)
Monocytes Relative: 8 %
Neutro Abs: 3.3 10*3/uL (ref 1.7–7.7)
Neutrophils Relative %: 63 %
Platelets: 210 10*3/uL (ref 150–400)
RBC: 5.19 MIL/uL — AB (ref 3.87–5.11)
RDW: 18.1 % — AB (ref 11.5–15.5)
WBC: 5.3 10*3/uL (ref 4.0–10.5)

## 2016-11-19 LAB — COMPREHENSIVE METABOLIC PANEL
ALBUMIN: 3.7 g/dL (ref 3.5–5.0)
ALT: 11 U/L — ABNORMAL LOW (ref 14–54)
AST: 18 U/L (ref 15–41)
Alkaline Phosphatase: 67 U/L (ref 38–126)
Anion gap: 13 (ref 5–15)
BILIRUBIN TOTAL: 0.8 mg/dL (ref 0.3–1.2)
BUN: 18 mg/dL (ref 6–20)
CHLORIDE: 98 mmol/L — AB (ref 101–111)
CO2: 25 mmol/L (ref 22–32)
Calcium: 8.8 mg/dL — ABNORMAL LOW (ref 8.9–10.3)
Creatinine, Ser: 6.09 mg/dL — ABNORMAL HIGH (ref 0.44–1.00)
GFR calc Af Amer: 7 mL/min — ABNORMAL LOW (ref >60)
GFR calc non Af Amer: 6 mL/min — ABNORMAL LOW (ref >60)
GLUCOSE: 86 mg/dL (ref 65–99)
POTASSIUM: 2.8 mmol/L — AB (ref 3.5–5.1)
Sodium: 136 mmol/L (ref 135–145)
Total Protein: 7.8 g/dL (ref 6.5–8.1)

## 2016-11-19 LAB — LIPASE, BLOOD: Lipase: 50 U/L (ref 11–51)

## 2016-11-19 LAB — MAGNESIUM: MAGNESIUM: 1.7 mg/dL (ref 1.7–2.4)

## 2016-11-19 LAB — CBG MONITORING, ED: Glucose-Capillary: 84 mg/dL (ref 65–99)

## 2016-11-19 LAB — TSH: TSH: 0.563 u[IU]/mL (ref 0.350–4.500)

## 2016-11-19 LAB — MRSA PCR SCREENING: MRSA by PCR: NEGATIVE

## 2016-11-19 LAB — CREATININE, SERUM
Creatinine, Ser: 6.4 mg/dL — ABNORMAL HIGH (ref 0.44–1.00)
GFR calc Af Amer: 7 mL/min — ABNORMAL LOW (ref >60)
GFR, EST NON AFRICAN AMERICAN: 6 mL/min — AB (ref >60)

## 2016-11-19 MED ORDER — PANTOPRAZOLE SODIUM 40 MG IV SOLR
40.0000 mg | INTRAVENOUS | Status: DC
  Administered 2016-11-19: 40 mg via INTRAVENOUS
  Filled 2016-11-19: qty 40

## 2016-11-19 MED ORDER — LEVOTHYROXINE SODIUM 100 MCG IV SOLR
INTRAVENOUS | Status: AC
  Filled 2016-11-19: qty 5

## 2016-11-19 MED ORDER — ONDANSETRON HCL 4 MG/2ML IJ SOLN: 4.0000 mg | Freq: Four times a day (QID) | INTRAMUSCULAR | Status: DC | PRN

## 2016-11-19 MED ORDER — ONDANSETRON HCL 4 MG/2ML IJ SOLN
4.0000 mg | Freq: Once | INTRAMUSCULAR | Status: AC
  Administered 2016-11-19: 4 mg via INTRAVENOUS
  Filled 2016-11-19: qty 2

## 2016-11-19 MED ORDER — ONDANSETRON HCL 4 MG PO TABS
4.0000 mg | ORAL_TABLET | Freq: Four times a day (QID) | ORAL | Status: DC | PRN
  Administered 2016-11-19: 4 mg via ORAL
  Filled 2016-11-19: qty 1

## 2016-11-19 MED ORDER — SEVELAMER CARBONATE 800 MG PO TABS
2400.0000 mg | ORAL_TABLET | Freq: Three times a day (TID) | ORAL | Status: DC
  Filled 2016-11-19: qty 3

## 2016-11-19 MED ORDER — LEVOTHYROXINE SODIUM 100 MCG IV SOLR
25.0000 ug | Freq: Every day | INTRAVENOUS | Status: DC
  Administered 2016-11-19 – 2016-11-20 (×2): 25 ug via INTRAVENOUS
  Filled 2016-11-19 (×4): qty 5

## 2016-11-19 MED ORDER — ALLOPURINOL 300 MG PO TABS
150.0000 mg | ORAL_TABLET | Freq: Every day | ORAL | Status: DC
  Administered 2016-11-19 – 2016-11-20 (×2): 150 mg via ORAL
  Filled 2016-11-19 (×2): qty 1

## 2016-11-19 MED ORDER — NIFEDIPINE ER OSMOTIC RELEASE 30 MG PO TB24
90.0000 mg | ORAL_TABLET | Freq: Every day | ORAL | Status: DC
  Administered 2016-11-19 – 2016-11-20 (×2): 90 mg via ORAL
  Filled 2016-11-19 (×2): qty 3

## 2016-11-19 MED ORDER — DOXAZOSIN MESYLATE 4 MG PO TABS
8.0000 mg | ORAL_TABLET | Freq: Every day | ORAL | Status: DC
  Administered 2016-11-19 – 2016-11-20 (×2): 8 mg via ORAL
  Filled 2016-11-19: qty 4
  Filled 2016-11-19 (×4): qty 2

## 2016-11-19 MED ORDER — METOCLOPRAMIDE HCL 5 MG/ML IJ SOLN
5.0000 mg | Freq: Three times a day (TID) | INTRAMUSCULAR | Status: DC
  Administered 2016-11-19 – 2016-11-20 (×3): 5 mg via INTRAVENOUS
  Filled 2016-11-19 (×3): qty 2

## 2016-11-19 MED ORDER — PROMETHAZINE HCL 25 MG/ML IJ SOLN: 12.5000 mg | Freq: Four times a day (QID) | INTRAMUSCULAR | Status: DC | PRN

## 2016-11-19 MED ORDER — LISINOPRIL 10 MG PO TABS
40.0000 mg | ORAL_TABLET | Freq: Every day | ORAL | Status: DC
  Administered 2016-11-19 – 2016-11-20 (×2): 40 mg via ORAL
  Filled 2016-11-19 (×2): qty 4

## 2016-11-19 MED ORDER — PROMETHAZINE HCL 25 MG/ML IJ SOLN
12.5000 mg | Freq: Once | INTRAMUSCULAR | Status: AC
  Administered 2016-11-19: 12.5 mg via INTRAVENOUS
  Filled 2016-11-19: qty 1

## 2016-11-19 MED ORDER — ACETAMINOPHEN 650 MG RE SUPP: 650.0000 mg | Freq: Four times a day (QID) | RECTAL | Status: DC | PRN

## 2016-11-19 MED ORDER — ACETAMINOPHEN 325 MG PO TABS
650.0000 mg | ORAL_TABLET | Freq: Four times a day (QID) | ORAL | Status: DC | PRN
  Administered 2016-11-20: 650 mg via ORAL
  Filled 2016-11-19: qty 2

## 2016-11-19 MED ORDER — NICARDIPINE HCL IN NACL 20-0.86 MG/200ML-% IV SOLN
3.0000 mg/h | Freq: Once | INTRAVENOUS | Status: AC
  Administered 2016-11-19: 3 mg/h via INTRAVENOUS
  Filled 2016-11-19: qty 200

## 2016-11-19 MED ORDER — POTASSIUM CHLORIDE 10 MEQ/100ML IV SOLN
10.0000 meq | INTRAVENOUS | Status: AC
  Administered 2016-11-19 (×4): 10 meq via INTRAVENOUS
  Filled 2016-11-19 (×4): qty 100

## 2016-11-19 NOTE — ED Notes (Signed)
Pt taken to CT by Bambi.

## 2016-11-19 NOTE — ED Provider Notes (Addendum)
[facility] DEPT Provider Note   CSN: [id number] Arrival date & time: 11/19/16  1113  By signing my name below, I, Jaquelyn Bitter., attest that this documentation has been prepared under the direction and in the presence of Nat Christen, MD. Electronically signed: Jaquelyn Bitter., ED Scribe. 11/19/16. 3:56 PM.    History   Chief Complaint Chief Complaint  Patient presents with  . Numbness   Level 5 caveat, urgent need for intervention  HPI Sonya [name] is a 66 y.o. female who presents to the Emergency Department complaining of numbness with onset x1 day. Per nurse, pt began feeling ill x3 days ago after vomiting at dialysis. Per neighbor, pt began having bilateral hand and feet numbness this morning after she awoke. She states that she could hardly move her arms and legs which prompted her to come into the ED. Pt reports difficulty ambulating for the past x1 day. She reports subjective tactile fever, speech difficulty, confusion. Pt denies any modifying factors. She denies cough. Of note, pt does dialysis Tues, Thursday and Saturday.Neighbor states that pt was last normal x5 days ago. Pt lives at home with husband who has stage 4 cancer.     The history is provided by the patient and a friend. No language interpreter was used.    Past Medical History:  Diagnosis Date  . Anemia   . CKD (chronic kidney disease)   . Diverticulitis   . Dysphagia   . ESRD (end stage renal disease) ([place])    On HD  . Fracture of femoral neck, left, closed ([place]) 05/30/2015  . Gastritis 06/2013 & 05/2015  . Gastroesophageal reflux disease with stricture 05/2015  . GERD (gastroesophageal reflux disease)   . HTN (hypertension)   . Hyperparathyroidism due to renal insufficiency ([place])   . Hypertensive urgency 06/2013 & 04/2015  . Hypothyroidism   . ICH (intracerebral hemorrhage) ([place]) 10/2014  . 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 Protein calorie malnutrition ([place])   . Stroke ([place])   . Uterine  cancer ([place]) 2012  . Vertical diplopia   . Vertigo     Patient Active Problem List   Diagnosis Date Noted  . Abnormal weight loss 12/01/2015  . Stomach growling 12/01/2015  . Pulmonary edema 08/19/2015  . CAP (community acquired pneumonia) 08/19/2015  . Hypothyroidism, adult 06/14/2015  . HA (headache) 06/13/2015  . HTN (hypertension), malignant 06/13/2015  . Elective surgery   . Fracture of femoral neck, left, closed ([place]) 05/30/2015  . Hip fracture ([place]) 05/30/2015  . Closed fracture of neck of left femur ([place])   . Loss of weight   . Emesis   . Nausea with vomiting 05/13/2015  . UTI (lower urinary tract infection) 04/14/2015  . Orthostatic hypotension 11/27/2014  . Accelerated hypertension 11/25/2014  . Dizziness   . Uncontrolled hypertension   . Acute encephalopathy   . Confusion   . Protein-calorie malnutrition, severe ([place]) 10/15/2014  . Dysphagia 10/15/2014  . Hypertensive emergency 10/14/2014  . Hypertensive urgency, malignant 10/14/2014  . Intracerebral bleed ([place]) 10/14/2014  . End stage renal disease ([place]) 08/31/2013  . Symptomatic anemia 08/18/2013  . Hyperparathyroidism due to renal insufficiency ([place]) 07/09/2013  . Unspecified constipation 06/21/2013  . Gastritis 06/21/2013  . Anemia 06/20/2013  . Dizzy 06/20/2013  . Chronic kidney disease 06/20/2013  . Anemia in chronic kidney disease 06/08/2013  . Regurgitation 06/08/2013  . Hypokalemia 06/08/2013  . Gout 06/08/2013  . Other dysphagia 06/07/2013  . Hypertensive urgency 06/07/2013  .  [place]orb[place]gmi 04/08/2011  . Essential hypertension 04/08/2011  . Colon cancer screening 04/08/2011    Past Surgical History:  Procedure Laterality Date  . ABDOMINAL HYSTERECTOMY    . APPENDECTOMY    . BASCILIC VEIN TRANSPOSITION Right 09/17/2013   Procedure: BRACHIAL VEIN TRANSPOSITION;  Surgeon: Conrad Sargent, MD;  Location: [facility];  Service: Vascular;  Laterality: Right;  . BASCILIC VEIN TRANSPOSITION Right 10/29/2013    Procedure: RIGHT SECOND STAGE BRACHIAL VEIN TRANSPOSITION;  Surgeon: Conrad Millers[place], MD;  Location: [facility];  Service: Vascular;  Laterality: Right;  . CESAREAN SECTION    . CHOLECYSTECTOMY    . COLONOSCOPY  2000   TICS, IH  . COLONOSCOPY  2003 NUR BRBPR D50 V6   DC/[facility] TICS, IH  . COLONOSCOPY N/A 09/04/2013   SLF: Small ulcer in the descending colon, one colon polyp (tubular adenoma), large internal hemorrhoids, moderate diverticulosis. next tcs 10 years.  . ESOPHAGOGASTRODUODENOSCOPY N/A 05/23/2015   OIZ:TIWP non-erosive gastritis/stricture at the gastroesophageal junction  . ESOPHAGOGASTRODUODENOSCOPY (EGD) WITH ESOPHAGEAL DILATION N/A 06/08/2013   Dr. Fields:Stricture at the gastroesophageal junction/MILD NON-erosive gastritis (inflammation) was found in the gastric antrum; multiple biopsies/NO SOURCE FOR ANEMIA IDETIFIED-MOST LIKELY DUE TO ANEMIA OF CHRONIC DISEASE  . FLEXIBLE SIGMOIDOSCOPY N/A 01/02/2016   Dr. Oneida Alar: external hemorrhoids, diverticulosis, internal hemorrhoids, proximal rectum normal  . GIVENS CAPSULE STUDY N/A 12/03/2013   SLF: occasional gastric erosion. small bowel normal  . HIP PINNING,CANNULATED Left 05/31/2015   Procedure: CANNULATED HIP PINNING;  Surgeon: Leandrew Koyanagi, MD;  Location: [facility];  Service: Orthopedics;  Laterality: Left;  . INSERTION OF DIALYSIS CATHETER Left 09/17/2013   Procedure: INSERTION OF DIALYSIS CATHETER;  Surgeon: Conrad Tyonek, MD;  Location: [facility];  Service: Vascular;  Laterality: Left;  . LAPAROSCOPIC TOTAL HYSTERECTOMY    . NASAL SEPTUM SURGERY    . SAVORY DILATION N/A 05/23/2015   Procedure: SAVORY DILATION;  Surgeon: Danie Binder, MD;  [facility];  Service: Endoscopy;  Laterality: N/A;    OB History    Gravida Para Term Preterm AB Living   1 1 1          SAB TAB Ectopic Multiple Live Births                   Home Medications    Prior to Admission medications   Medication Sig Start Date End Date Taking? Authorizing Provider   alendronate (FOSAMAX) 70 MG tablet Take 70 mg by mouth once a week. Take with a full glass of water on an empty stomach.   Yes Historical Provider, MD  allopurinol (ZYLOPRIM) 300 MG tablet Take 0.5 tablets (150 mg total) by mouth daily. Dose was decreased secondary to renal failure. 06/16/15  Yes Rexene Alberts, MD  doxazosin (CARDURA) 8 MG tablet Take 1 tablet (8 mg total) by mouth daily. 08/24/15  Yes Samuella Cota, MD  levothyroxine (SYNTHROID, LEVOTHROID) 50 MCG tablet Take 1 tablet (50 mcg total) by mouth daily. 04/17/15  Yes Samuella Cota, MD  lidocaine-prilocaine (EMLA) cream Apply 1 application topically every Tuesday, Thursday, and Saturday at 6 PM.    Yes Historical Provider, MD  lisinopril (PRINIVIL,ZESTRIL) 40 MG tablet Take 1 tablet (40 mg total) by mouth daily. 06/16/15  Yes Rexene Alberts, MD  meclizine (ANTIVERT) 25 MG tablet Take 1 tablet by mouth every 6 (six) hours as needed for dizziness. dizziness 11/22/14  Yes Historical Provider, MD  metoCLOPramide (REGLAN) 5 MG tablet 1 po  before breakfast. 01/02/16  Yes Danie Binder, MD  NIFEdipine (PROCARDIA XL/ADALAT-CC) 90 MG 24 hr tablet Take 90 mg by mouth daily. 04/10/15  Yes Historical Provider, MD  omeprazole (PRILOSEC) 20 MG capsule TAKE ONE CAPSULE BY MOUTH ONCE DAILY 05/18/16  Yes Annitta Needs, NP  senna-docusate (SENOKOT-S) 8.6-50 MG tablet Take 1 tablet by mouth 2 (two) times daily.    Yes Historical Provider, MD  sevelamer carbonate (RENVELA) 800 MG tablet Take 2,400 mg by mouth 3 (three) times daily with meals.    Yes Historical Provider, MD    Family History Family History  Problem Relation Age of Onset  . Cancer Mother   . Diabetes Mother   . Hypertension Mother   . Diabetes Father   . Hypertension Father   . Hypertension Sister   . Colon cancer Neg Hx     Social History Social History  Substance Use Topics  . Smoking status: Never Smoker  . Smokeless tobacco: Never Used  . Alcohol use No     Allergies    Cephalexin   Review of Systems Review of Systems  Reason unable to perform ROS: Urgent need for intervention.     Physical Exam Updated Vital Signs BP (!) 245/115 Comment: Simultaneous filing. User may not have seen previous data.  Pulse 100   Temp 98.2 F (36.8 C)   Resp 16   Ht 5\' 7"  (1.702 m)   Wt 154 lb (69.9 kg)   SpO2 100%   BMI 24.12 kg/m   Physical Exam  Constitutional: She appears well-developed and well-nourished.  Restless and agitated.  HENT:  Head: Normocephalic and atraumatic.  Eyes: Conjunctivae are normal.  Neck: Neck supple.  Cardiovascular: Normal rate and regular rhythm.   Pulmonary/Chest: Effort normal and breath sounds normal.  Abdominal: Soft. Bowel sounds are normal.  Musculoskeletal: Normal range of motion.  Neurological: She is alert.  Alert & Oriented x2, pt did not know the day.   Skin: Skin is warm and dry.  Psychiatric: She has a normal mood and affect. Her behavior is normal.  Nursing note and vitals reviewed.    ED Treatments / Results   DIAGNOSTIC STUDIES: Oxygen Saturation is 100% normal by my interpretation.   COORDINATION OF CARE: 3:56 PM-Discussed next steps with pt. Pt verbalized understanding and is agreeable with the plan.    Labs (all labs ordered are listed, but only abnormal results are displayed) Labs Reviewed  CBC WITH DIFFERENTIAL/PLATELET - Abnormal; Notable for the following:       Result Value   RBC 5.19 (*)    MCH 25.2 (*)    RDW 18.1 (*)    All other components within normal limits  COMPREHENSIVE METABOLIC PANEL - Abnormal; Notable for the following:    Potassium 2.8 (*)    Chloride 98 (*)    Creatinine, Ser 6.09 (*)    Calcium 8.8 (*)    ALT 11 (*)    GFR calc non Af Amer 6 (*)    GFR calc Af Amer 7 (*)    All other components within normal limits  CBG MONITORING, ED    EKG  EKG Interpretation  Date/Time:  Friday November 19 2016 11:23:17 EDT Ventricular Rate:  97 PR Interval:    QRS  Duration: 87 QT Interval:  455 QTC Calculation: 579 R Axis:   67 Text Interpretation:  Sinus rhythm Biatrial enlargement Probable left ventricular hypertrophy Nonspecific T abnormalities, anterior leads Prolonged QT interval Baseline wander in  lead(s) II III aVF Confirmed by Lacinda Axon  MD, Rosa Wyly ([id number]) on 11/19/2016 1:54:13 PM       Radiology Ct Head Wo Contrast  Result Date: 11/19/2016 CLINICAL DATA:  Awoke this morning with foot and hand numbness bilaterally. Speech disturbance and confusion. EXAM: CT HEAD WITHOUT CONTRAST TECHNIQUE: Contiguous axial images were obtained from the base of the skull through the vertex without intravenous contrast. COMPARISON:  08/15/2015 other CT and MRI studies going as far back as 12/02/2008. FINDINGS: Brain: Extensive chronic small-vessel ischemic changes affect the cerebral hemispheric white matter. Old appearing small vessel ischemic changes affect the thalami and basal ganglia. No sign of acute infarction, mass lesion, hemorrhage, hydrocephalus or extra-axial collection. 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 The meningocele component is enlarging over time, now completely filling the left division of the sphenoid sinus. Vascular: There is atherosclerotic calcification of the major vessels at the base of the brain. 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 Orbits negative. Other: None significant IMPRESSION: No acute intracranial finding. Extensive chronic small-vessel ischemic changes throughout the brain as outlined above. 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 Has this been specifically evaluated, with treatment plan? Electronically Signed   By: Nelson Chimes M.D.   On: 11/19/2016 14:44    Procedures Procedures (including critical care  time)  Medications Ordered in ED Medications  doxazosin (CARDURA) tablet 8 mg (8 mg Oral Given 11/19/16 1500)  lisinopril (PRINIVIL,ZESTRIL) tablet 40 mg (40 mg Oral Given 11/19/16 1403)  NIFEdipine (PROCARDIA-XL/ADALAT-CC/NIFEDICAL-XL) 24 hr tablet 90 mg (90 mg Oral Given 11/19/16 1403)  promethazine (PHENERGAN) injection 12.5 mg (not administered)  nicardipine (CARDENE) 20mg  in 0.86% saline 233ml IV infusion (0.1 mg/ml) (not administered)  ondansetron (ZOFRAN) injection 4 mg (4 mg Intravenous Given 11/19/16 1403)     Initial Impression / Assessment and Plan / ED Course  I have reviewed the triage vital signs and the nursing notes.  The plan is to have a CT of pt's head and do basic labs.   Pertinent labs & imaging results that were available during my care of the patient were reviewed by me and considered in my medical decision making (see chart for details). CRITICAL CARE Performed by: Nat Christen Total critical care time: 30 minutes Critical care time was exclusive of separately billable procedures and treating other patients. Critical care was necessary to treat or prevent imminent or life-threatening deterioration. Critical care was time spent personally by me on the following activities: development of treatment plan with patient and/or surrogate as well as nursing, discussions with consultants, evaluation of patient's response to treatment, examination of patient, obtaining history from patient or surrogate, ordering and performing treatments and interventions, ordering and review of laboratory studies, ordering and review of radiographic studies, pulse oximetry and re-evaluation of patient's condition.    Complex patient with multiple health problems presents with nausea and vomiting, weakness, numbness, difficulty ambulating.  She was initially very hypertensive, but has not taken her blood pressure medicines today. Patient vomited her blood pressure medications; hence, a Cardene drip  was started. CT head shows no acute findings. She is also hypokalemic. Will admit to general medicine.  Final Clinical Impressions(s) / ED Diagnoses   Final diagnoses:  ESRD (end stage renal disease) ([place])  Hypertension, unspecified type  Nausea and vomiting, intractability of vomiting not specified, unspecified vomiting type  Weakness  Hypokalemia  New Prescriptions New Prescriptions   No medications on file   I personally performed the services described in this documentation, which was scribed in my presence. The recorded information has been reviewed and is accurate.      Nat Christen, MD 11/19/16 [place], MD 11/20/16 [phone]

## 2016-11-19 NOTE — ED Notes (Signed)
Dr Lacinda Axon notified of pt throwing up and BP continues to be elevated, advised he will enter orders for nausea and Cardene drip for BP

## 2016-11-19 NOTE — ED Notes (Signed)
Dr Cook at bedside

## 2016-11-19 NOTE — ED Notes (Signed)
Pt taken to ICU and settled in bed. First run of potassium complete at this time. Pt in NAD.

## 2016-11-19 NOTE — H&P (Signed)
History and Physical  Sonya [name] YNW:[id number] DOB: [birth year] DOA: 11/19/2016   PCP: Cassell Smiles, MD   Patient coming from: Home  Chief Complaint: N/V  HPI:  Sonya [name] is a 66 y.o. female with medical history of hypertension, ESRD (TTS), esophageal stricture, and hypothyroidism presents with 3-4 day history of nausea and vomiting with associated lower abdominal pain. The patient complains of subjective fevers and chills that started on the morning of 11/19/2016. She denies diarrhea, hematochezia, hematemesis. The patient is anuric secondary to her ESRD. On the morning of 11/19/2016, the patient also complaining of some numbness and tingling in her bilateral hands and feet. She states that this has never happened before. She denies any dysarthria, word finding difficulties, visual disturbance, or facial droop. Her numbness and tingling have completely resolved in the emergency department. Apparently, the patient has had some gait instability and generalized weakness. Unfortunately, she is not able to elaborate how long this been going on. She normally uses a cane to assist with ambulation. She denies any headache or neck pain. She denies any chest pain, shortness of breath, coughing, hemoptysis. She is compliant with dialysis and last had dialysis on 11/18/2016. The patient denies any recent travels or unusual or undercooked foods. She denies any sick contacts. She states that her daughter and husband with whom she lives do not have any similar illness.  In the emergency department, the patient was afebrile and hemodynamically stable and saturating 100% on room air. Upon presentation, her blood pressure was up to 245/115. The patient was started on a nicardipine drip. CBC was unremarkable. BMP showed potassium of 2.8 with serum creatinine 6.09. CT of the brain was negative for acute findings. 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 An attempt was made to give  the patient's home oral antihypertensive medications, but the patient had emesis in the emergency department.  KCL was ordered in ED  Assessment/Plan: Intractable nausea and vomiting/abdominal pain -Suspect nonspecific gastroenteritis -Check lipase -CT abd/pelvis -convert medications to IV -IV antiemetics and reglan -clear liquid diet -IV PPI  Sensory disturbance -MRI brain -11/19/16 CT brain negative -serum B12 -TSH  HTN Urgency -continue nicardipine drip--titrate to drop BP 20% in next 12-24 hours -restart home meds--lisinopril, Cardura, nifedipine  Hypokalemia -judicious replacement in ESRD patient -check mag  ESRD -consult nephrology for maintenance dialysis -The patient has HD Tuesday, Thursday, Saturday       Past Medical History:  Diagnosis Date  . Anemia   . CKD (chronic kidney disease)   . Diverticulitis   . Dysphagia   . ESRD (end stage renal disease) (HCC)    On HD  . Fracture of femoral neck, left, closed (HCC) 05/30/2015  . Gastritis 06/2013 & 05/2015  . Gastroesophageal reflux disease with stricture 05/2015  . GERD (gastroesophageal reflux disease)   . HTN (hypertension)   . Hyperparathyroidism due to renal insufficiency (HCC)   . Hypertensive urgency 06/2013 & 04/2015  . Hypothyroidism   . ICH (intracerebral hemorrhage) (HCC) 10/2014  . Meningocele (HCC)   . Protein calorie malnutrition (HCC)   . Stroke (HCC)   . Uterine cancer (HCC) 2012  . Vertical diplopia   . Vertigo    Past Surgical History:  Procedure Laterality Date  . ABDOMINAL HYSTERECTOMY    . APPENDECTOMY    . BASCILIC VEIN TRANSPOSITION Right 09/17/2013   Procedure: BRACHIAL VEIN TRANSPOSITION;  Surgeon: Fransisco Hertz, MD;  Location: [facility] OR;  Service:  Vascular;  Laterality: Right;  . BASCILIC VEIN TRANSPOSITION Right 10/29/2013   Procedure: RIGHT SECOND STAGE BRACHIAL VEIN TRANSPOSITION;  Surgeon: Fransisco Hertz, MD;  Location[facility] OR;  Service: Vascular;  Laterality: Right;  .  CESAREAN SECTION    . CHOLECYSTECTOMY    . COLONOSCOPY  2000   TICS, IH  . COLONOSCOPY  2003 NUR BRBPR D50 V6   [place] TICS, IH  . COLONOSCOPY N/A 09/04/2013   SLF: Small ulcer in the descending colon, one colon polyp (tubular adenoma), large internal hemorrhoids, moderate diverticulosis. next tcs 10 years.  . ESOPHAGOGASTRODUODENOSCOPY N/A 05/23/2015   NGE:XBMW non-erosive gastritis/stricture at the gastroesophageal junction  . ESOPHAGOGASTRODUODENOSCOPY (EGD) WITH ESOPHAGEAL DILATION N/A 06/08/2013   Dr. Fields:Stricture at the gastroesophageal junction/MILD NON-erosive gastritis (inflammation) was found in the gastric antrum; multiple biopsies/NO SOURCE FOR ANEMIA IDETIFIED-MOST LIKELY DUE TO ANEMIA OF CHRONIC DISEASE  . FLEXIBLE SIGMOIDOSCOPY N/A 01/02/2016   Dr. Darrick Penna: external hemorrhoids, diverticulosis, internal hemorrhoids, proximal rectum normal  . GIVENS CAPSULE STUDY N/A 12/03/2013   SLF: occasional gastric erosion. small bowel normal  . HIP PINNING,CANNULATED Left 05/31/2015   Procedure: CANNULATED HIP PINNING;  Surgeon: Tarry Kos, MD;  Location: [facility] OR;  Service: Orthopedics;  Laterality: Left;  . INSERTION OF DIALYSIS CATHETER Left 09/17/2013   Procedure: INSERTION OF DIALYSIS CATHETER;  Surgeon: Fransisco Hertz, MD;  Location: [facility] OR;  Service: Vascular;  Laterality: Left;  . LAPAROSCOPIC TOTAL HYSTERECTOMY    . NASAL SEPTUM SURGERY    . SAVORY DILATION N/A 05/23/2015   Procedure: SAVORY DILATION;  Surgeon: West Bali, MD;  Location: [facility] SUITE;  Service: Endoscopy;  Laterality: N/A;   Social History:  reports that she has never smoked. She has never used smokeless tobacco. She reports that she does not drink alcohol or use drugs.   Family History  Problem Relation Age of Onset  . Cancer Mother   . Diabetes Mother   . Hypertension Mother   . Diabetes Father   . Hypertension Father   . Hypertension Sister   . Colon cancer Neg Hx      Allergies  Allergen Reactions    . Cephalexin Itching     Prior to Admission medications   Medication Sig Start Date End Date Taking? Authorizing Provider  alendronate (FOSAMAX) 70 MG tablet Take 70 mg by mouth once a week. Take with a full glass of water on an empty stomach.   Yes Historical Provider, MD  allopurinol (ZYLOPRIM) 300 MG tablet Take 0.5 tablets (150 mg total) by mouth daily. Dose was decreased secondary to renal failure. 06/16/15  Yes Elliot Cousin, MD  doxazosin (CARDURA) 8 MG tablet Take 1 tablet (8 mg total) by mouth daily. 08/24/15  Yes Standley Brooking, MD  levothyroxine (SYNTHROID, LEVOTHROID) 50 MCG tablet Take 1 tablet (50 mcg total) by mouth daily. 04/17/15  Yes Standley Brooking, MD  lidocaine-prilocaine (EMLA) cream Apply 1 application topically every Tuesday, Thursday, and Saturday at 6 PM.    Yes Historical Provider, MD  lisinopril (PRINIVIL,ZESTRIL) 40 MG tablet Take 1 tablet (40 mg total) by mouth daily. 06/16/15  Yes Elliot Cousin, MD  meclizine (ANTIVERT) 25 MG tablet Take 1 tablet by mouth every 6 (six) hours as needed for dizziness. dizziness 11/22/14  Yes Historical Provider, MD  metoCLOPramide (REGLAN) 5 MG tablet 1 po before breakfast. 01/02/16  Yes West Bali, MD  NIFEdipine (PROCARDIA XL/ADALAT-CC) 90 MG 24 hr tablet Take 90 mg by mouth  daily. 04/10/15  Yes Historical Provider, MD  omeprazole (PRILOSEC) 20 MG capsule TAKE ONE CAPSULE BY MOUTH ONCE DAILY 05/18/16  Yes Gelene Mink, NP  senna-docusate (SENOKOT-S) 8.6-50 MG tablet Take 1 tablet by mouth 2 (two) times daily.    Yes Historical Provider, MD  sevelamer carbonate (RENVELA) 800 MG tablet Take 2,400 mg by mouth 3 (three) times daily with meals.    Yes Historical Provider, MD    Review of Systems:  Constitutional:  No weight loss, night sweats, Fevers, chills, fatigue.  Head&Eyes: No headache.  No vision loss.  No eye pain or scotoma ENT:  No Difficulty swallowing,Tooth/dental problems,Sore throat,  No ear ache, post nasal  drip,  Cardio-vascular:  No chest pain, Orthopnea, PND, swelling in lower extremities,  dizziness, palpitations  GI:  No  diarrhea, loss of appetite, hematochezia, melena, heartburn, indigestion, Resp:  No shortness of breath with exertion or at rest. No cough. No coughing up of blood .No wheezing.No chest wall deformity  Skin:  no rash or lesions.  GU:  no dysuria, change in color of urine, no urgency or frequency. No flank pain.  Musculoskeletal:  No joint pain or swelling. No decreased range of motion. No back pain.  Psych:  No change in mood or affect. No depression or anxiety. Neurologic: No headache, no dysesthesia, no focal weakness, no vision loss. No syncope  Physical Exam: Vitals:   11/19/16 1403 11/19/16 1500 11/19/16 1600 11/19/16 1630  BP: (!) 240/114 (!) 245/115 (!) 208/90 (!) 191/72  Pulse:  100 (!) 102 (!) 104  Resp:  16    Temp:      TempSrc:      SpO2:  100% 100% 98%  Weight:      Height:       General:  A&O x 3, NAD, nontoxic, pleasant/cooperative Head/Eye: No conjunctival hemorrhage, no icterus, [place]/AT, No nystagmus ENT:  No icterus,  No thrush, edentulous, no pharyngeal exudate Neck:  No masses, no lymphadenpathy, no bruits CV:  RRR, no rub, no gallop, no S3 Lung:  CTAB, good air movement, no wheeze, no rhonchi Abdomen: soft/NT, +BS, nondistended, no peritoneal signs Ext: No cyanosis, No rashes, No petechiae, No lymphangitis, No edema Neuro: CNII-XII intact, strength 4-/5 in bilateral upper and lower extremities, no dysmetria  Labs on Admission:  Basic Metabolic Panel:  Recent Labs Lab 11/19/16 1434  NA 136  K 2.8*  CL 98*  CO2 25  GLUCOSE 86  BUN 18  CREATININE 6.09*  CALCIUM 8.8*   Liver Function Tests:  Recent Labs Lab 11/19/16 1434  AST 18  ALT 11*  ALKPHOS 67  BILITOT 0.8  PROT 7.8  ALBUMIN 3.7   No results for input(s): LIPASE, AMYLASE in the last 168 hours. No results for input(s): AMMONIA in the last 168  hours. CBC:  Recent Labs Lab 11/19/16 1316  WBC 5.3  NEUTROABS 3.3  HGB 13.1  HCT 40.6  MCV 78.2  PLT 210   Coagulation Profile: No results for input(s): INR, PROTIME in the last 168 hours. Cardiac Enzymes: No results for input(s): CKTOTAL, CKMB, CKMBINDEX, TROPONINI in the last 168 hours. BNP: Invalid input(s): POCBNP CBG:  Recent Labs Lab 11/19/16 1119  GLUCAP 84   Urine analysis:    Component Value Date/Time   COLORURINE YELLOW 04/14/2015 1427   APPEARANCEUR CLEAR 04/14/2015 1427   LABSPEC 1.020 04/14/2015 1427   PHURINE 8.5 (H) 04/14/2015 1427   GLUCOSEU 100 (A) 04/14/2015 1427   HGBUR SMALL (A)  04/14/2015 1427   BILIRUBINUR NEGATIVE 04/14/2015 1427   KETONESUR NEGATIVE 04/14/2015 1427   PROTEINUR >300 (A) 04/14/2015 1427   UROBILINOGEN 0.2 04/14/2015 1427   NITRITE NEGATIVE 04/14/2015 1427   LEUKOCYTESUR TRACE (A) 04/14/2015 1427   Sepsis Labs: @LABRCNTIP (procalcitonin:4,lacticidven:4) )No results found for this or any previous visit (from the past 240 hour(s)).   Radiological Exams on Admission: Ct Head Wo Contrast  Result Date: 11/19/2016 CLINICAL DATA:  Awoke this morning with foot and hand numbness bilaterally. Speech disturbance and confusion. EXAM: CT HEAD WITHOUT CONTRAST TECHNIQUE: Contiguous axial images were obtained from the base of the skull through the vertex without intravenous contrast. COMPARISON:  08/15/2015 other CT and MRI studies going as far back as 12/02/2008. FINDINGS: Brain: Extensive chronic small-vessel ischemic changes affect the cerebral hemispheric white matter. Old appearing small vessel ischemic changes affect the thalami and basal ganglia. No sign of acute infarction, mass lesion, hemorrhage, hydrocephalus or extra-axial collection. 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 The meningocele component is enlarging over time, now completely filling the left division of the sphenoid sinus. Vascular: There is  atherosclerotic calcification of the major vessels at the base of the brain. 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 Orbits negative. Other: None significant IMPRESSION: No acute intracranial finding. Extensive chronic small-vessel ischemic changes throughout the brain as outlined above. 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 Has this been specifically evaluated, with treatment plan? Electronically Signed   By: Paulina Fusi M.D.   On: 11/19/2016 14:44    EKG: Independently reviewed. Sinus rhythm, nonspecific T-wave change    Time spent:60 minutes Code Status:   FULL Family Communication:  No Family at bedside Disposition Plan: expect 1-2 day hospitalization Consults called: nephrology DVT Prophylaxis: [place] Heparin    Sonya Heino, DO  [facility] Pager [phone]  If 7PM-7AM, please contact night-coverage [email or web address] Password [facility] 11/19/2016, 5:44 PM

## 2016-11-19 NOTE — ED Triage Notes (Signed)
Pt reports bilateral hand and body numbness when waking up.  Pt was fine when she went to bed last night at 2300.  Pt also reports she's having difficulty speaking upon waking.  Pt neighbor at bedside reports n/v and decreased appetite since Sunday.  Pt receives dialysis t/th/s and had tx yesterday. Pt alert and oriented at this time, speaking in full sentences and moving all extremities. Neighbor states her speech has changed (different today) since she saw her on Sunday. Pt has had difficulty ambulating today.  Dr. Lacinda Axon assessing pt at this time, received EKG from tech on pt.

## 2016-11-19 NOTE — ED Notes (Addendum)
Notified dr. Lacinda Axon of pt bp in 923'C systolic. Also aware of pt having nausea. Pt states she has not taken any of her bp medication today.

## 2016-11-19 NOTE — ED Notes (Signed)
Pt had BM on bsc.  Pt now back in bed on monitor.

## 2016-11-19 NOTE — ED Notes (Addendum)
Placed pt under bedpan for approximately 1 min, pt unable to void at this time, bedpan removed

## 2016-11-20 ENCOUNTER — Inpatient Hospital Stay (HOSPITAL_COMMUNITY): Payer: Medicare Other

## 2016-11-20 DIAGNOSIS — E039 Hypothyroidism, unspecified: Secondary | ICD-10-CM

## 2016-11-20 DIAGNOSIS — E876 Hypokalemia: Secondary | ICD-10-CM

## 2016-11-20 DIAGNOSIS — N186 End stage renal disease: Secondary | ICD-10-CM

## 2016-11-20 DIAGNOSIS — Q019 Encephalocele, unspecified: Secondary | ICD-10-CM

## 2016-11-20 DIAGNOSIS — K529 Noninfective gastroenteritis and colitis, unspecified: Principal | ICD-10-CM

## 2016-11-20 DIAGNOSIS — I160 Hypertensive urgency: Secondary | ICD-10-CM

## 2016-11-20 DIAGNOSIS — R112 Nausea with vomiting, unspecified: Secondary | ICD-10-CM

## 2016-11-20 LAB — CBC
HCT: 35.8 % — ABNORMAL LOW (ref 36.0–46.0)
HEMOGLOBIN: 11.5 g/dL — AB (ref 12.0–15.0)
MCH: 25 pg — AB (ref 26.0–34.0)
MCHC: 32.1 g/dL (ref 30.0–36.0)
MCV: 77.8 fL — AB (ref 78.0–100.0)
Platelets: 186 10*3/uL (ref 150–400)
RBC: 4.6 MIL/uL (ref 3.87–5.11)
RDW: 17.4 % — ABNORMAL HIGH (ref 11.5–15.5)
WBC: 5.2 10*3/uL (ref 4.0–10.5)

## 2016-11-20 LAB — BASIC METABOLIC PANEL
ANION GAP: 14 (ref 5–15)
BUN: 22 mg/dL — ABNORMAL HIGH (ref 6–20)
CHLORIDE: 102 mmol/L (ref 101–111)
CO2: 21 mmol/L — AB (ref 22–32)
CREATININE: 7.02 mg/dL — AB (ref 0.44–1.00)
Calcium: 8.4 mg/dL — ABNORMAL LOW (ref 8.9–10.3)
GFR calc non Af Amer: 5 mL/min — ABNORMAL LOW (ref >60)
GFR, EST AFRICAN AMERICAN: 6 mL/min — AB (ref >60)
Glucose, Bld: 76 mg/dL (ref 65–99)
Potassium: 3.4 mmol/L — ABNORMAL LOW (ref 3.5–5.1)
Sodium: 137 mmol/L (ref 135–145)

## 2016-11-20 LAB — VITAMIN B12: Vitamin B-12: 1149 pg/mL — ABNORMAL HIGH (ref 180–914)

## 2016-11-20 LAB — T4, FREE: Free T4: 0.96 ng/dL (ref 0.61–1.12)

## 2016-11-20 MED ORDER — PENTAFLUOROPROP-TETRAFLUOROETH EX AERO
1.0000 "application " | INHALATION_SPRAY | CUTANEOUS | Status: DC | PRN
  Filled 2016-11-20: qty 30

## 2016-11-20 MED ORDER — ALTEPLASE 2 MG IJ SOLR
2.0000 mg | Freq: Once | INTRAMUSCULAR | Status: DC | PRN
  Filled 2016-11-20: qty 2

## 2016-11-20 MED ORDER — HEPARIN SODIUM (PORCINE) 1000 UNIT/ML DIALYSIS
1000.0000 [IU] | INTRAMUSCULAR | Status: DC | PRN
  Filled 2016-11-20: qty 1

## 2016-11-20 MED ORDER — CIPROFLOXACIN HCL 250 MG PO TABS
500.0000 mg | ORAL_TABLET | Freq: Every day | ORAL | Status: DC
  Administered 2016-11-20: 500 mg via ORAL
  Filled 2016-11-20: qty 2

## 2016-11-20 MED ORDER — CIPROFLOXACIN HCL 500 MG PO TABS: 500.0000 mg | ORAL_TABLET | Freq: Every day | ORAL | 0 refills | Status: DC

## 2016-11-20 MED ORDER — IOPAMIDOL (ISOVUE-300) INJECTION 61%
INTRAVENOUS | Status: AC
  Filled 2016-11-20: qty 30

## 2016-11-20 MED ORDER — NICARDIPINE HCL IN NACL 20-0.86 MG/200ML-% IV SOLN
3.0000 mg/h | INTRAVENOUS | Status: DC
  Administered 2016-11-20: 3 mg/h via INTRAVENOUS
  Filled 2016-11-20: qty 200

## 2016-11-20 MED ORDER — LIDOCAINE-PRILOCAINE 2.5-2.5 % EX CREA
1.0000 | TOPICAL_CREAM | CUTANEOUS | Status: DC | PRN
Start: 2016-11-20 — End: 2016-11-20
  Filled 2016-11-20: qty 5

## 2016-11-20 MED ORDER — SODIUM CHLORIDE 0.9 % IV SOLN: 100.0000 mL | INTRAVENOUS | Status: DC | PRN

## 2016-11-20 MED ORDER — METRONIDAZOLE 500 MG PO TABS
500.0000 mg | ORAL_TABLET | Freq: Three times a day (TID) | ORAL | Status: DC
  Administered 2016-11-20: 500 mg via ORAL
  Filled 2016-11-20: qty 1

## 2016-11-20 MED ORDER — LIDOCAINE HCL (PF) 1 % IJ SOLN: 5.0000 mL | INTRAMUSCULAR | Status: DC | PRN

## 2016-11-20 MED ORDER — METRONIDAZOLE 500 MG PO TABS: 500.0000 mg | ORAL_TABLET | Freq: Three times a day (TID) | ORAL | 0 refills | Status: DC

## 2016-11-20 NOTE — Progress Notes (Signed)
PHARMACY NOTE:  ANTIMICROBIAL RENAL DOSAGE ADJUSTMENT  Current antimicrobial regimen includes a mismatch between antimicrobial dosage and estimated renal function.  As per policy approved by the Pharmacy & Therapeutics and Medical Executive Committees, the antimicrobial dosage will be adjusted accordingly.  Current antimicrobial dosage:  Cipro 500mg  PO bid  Indication: suspected gastroenteritis  Renal Function: Estimated Creatinine Clearance: 7.6 mL/min (A) (by C-G formula based on SCr of 7.02 mg/dL (H)). [x]      On intermittent HD, scheduled: T-Th-Sat    Antimicrobial dosage has been changed to:  Cipro 500mg  PO daily  Additional comments: Thank you for allowing pharmacy to be a part of this patient's care.  Ena Dawley, [facility] 11/20/2016 10:53 AM

## 2016-11-20 NOTE — Progress Notes (Signed)
Pt drinking PO contrast for CT 

## 2016-11-20 NOTE — Discharge Summary (Signed)
Physician Discharge Summary  Sonya [name] KGU:[id number] DOB: 1951/01/25 DOA: 11/19/2016  PCP: Glo Herring, MD  Admit date: 11/19/2016 Discharge date: 11/20/2016  Admitted From: Home Disposition:  Home  Recommendations for Outpatient Follow-up:  1. Follow up with PCP in 1-2 weeks 2. Please obtain BMP/CBC in one week 3. Consider outpatient referral to neurosurgery for further evaluation of meningoencephalocele 4. Follow-up with nephrology for next dialysis session as regularly scheduled  Discharge Condition:Stable CODE STATUS:Full code Diet recommendation: Heart Healthy  Brief/Interim Summary: 66 year old female with history of end-stage renal disease on hemodialysis, presented to the hospital with complaints of nausea and vomiting and lower abdominal pain. CT scan showed evidence of colitis. The patient was admitted to the hospital treated supportively with antiemetics. She was started on ciprofloxacin and Flagyl. Diet was slowly advanced and her symptoms have since resolved. She is now tolerating a solid diet. She'll complete a course of antibiotics.  She was noted to be markedly hypertensive in the emergency room, likely due to persistent vomiting and inability to keep down her antihypertensives. She was initially started on a Cardene infusion, subsequently was transitioned back to her regular antihypertensive medications. Since her vomiting had resolved, she was able to keep down her antihypertensives and her blood pressure has improved. She does not have any headache.  Due to transient paresthesias and numbness in her hands and feet, MRI of the brain was done that did not show any new/acute findings. There was evidence of meningoencephalocele, but this is been present since 2016. She does not appear to be symptomatic. Her paresthesias and numbness have completely resolved. She is not having any headaches. Can consider possible outpatient referral to neurosurgery for further  evaluation.  Remainder of the patient's medical issues have been stable and she feels ready to discharge home.  Discharge Diagnoses:  Active Problems:   Hypokalemia   Gout   End stage renal disease (HCC)   Hypertensive urgency, malignant   Nausea and vomiting   Hypothyroidism, adult   Meningoencephalocele (HCC)   Colitis, acute    Discharge Instructions  Discharge Instructions    Diet - low sodium heart healthy    Complete by:  As directed    Increase activity slowly    Complete by:  As directed      Allergies as of 11/20/2016      Reactions   Cephalexin Itching      Medication List    STOP taking these medications   senna-docusate 8.6-50 MG tablet Commonly known as:  Senokot-S     TAKE these medications   alendronate 70 MG tablet Commonly known as:  FOSAMAX Take 70 mg by mouth once a week. Take with a full glass of water on an empty stomach.   allopurinol 300 MG tablet Commonly known as:  ZYLOPRIM Take 0.5 tablets (150 mg total) by mouth daily. Dose was decreased secondary to renal failure.   ciprofloxacin 500 MG tablet Commonly known as:  CIPRO Take 1 tablet (500 mg total) by mouth daily. Start taking on:  11/21/2016   doxazosin 8 MG tablet Commonly known as:  CARDURA Take 1 tablet (8 mg total) by mouth daily.   levothyroxine 50 MCG tablet Commonly known as:  SYNTHROID, LEVOTHROID Take 1 tablet (50 mcg total) by mouth daily.   lidocaine-prilocaine cream Commonly known as:  EMLA Apply 1 application topically every Tuesday, Thursday, and Saturday at 6 PM.   lisinopril 40 MG tablet Commonly known as:  PRINIVIL,ZESTRIL Take 1 tablet (40  mg total) by mouth daily.   meclizine 25 MG tablet Commonly known as:  ANTIVERT Take 1 tablet by mouth every 6 (six) hours as needed for dizziness. dizziness   metoCLOPramide 5 MG tablet Commonly known as:  REGLAN 1 po before breakfast.   metroNIDAZOLE 500 MG tablet Commonly known as:  FLAGYL Take 1 tablet (500  mg total) by mouth every 8 (eight) hours.   NIFEdipine 90 MG 24 hr tablet Commonly known as:  PROCARDIA XL/ADALAT-CC Take 90 mg by mouth daily.   omeprazole 20 MG capsule Commonly known as:  PRILOSEC TAKE ONE CAPSULE BY MOUTH ONCE DAILY   sevelamer carbonate 800 MG tablet Commonly known as:  RENVELA Take 2,400 mg by mouth 3 (three) times daily with meals.      Follow-up Information    Glo Herring, MD. Schedule an appointment as soon as possible for a visit in 1 week(s).   Specialty:  Internal Medicine Why:  discuss your MRI results Contact information: [address] [place] [address] [phone]          Allergies  Allergen Reactions  . Cephalexin Itching    Consultations:  Nephrology   Procedures/Studies: Ct Abdomen Pelvis Wo Contrast  Result Date: 11/20/2016 CLINICAL DATA:  Patient with nausea, vomiting and abdominal pain for 1 day. EXAM: CT ABDOMEN AND PELVIS WITHOUT CONTRAST TECHNIQUE: Multidetector CT imaging of the abdomen and pelvis was performed following the standard protocol without IV contrast. COMPARISON:  CT abdomen pelvis 12/05/2015 FINDINGS: Lower chest: Normal heart size. Trace pericardial fluid. Dependent atelectasis. No pleural effusion. Hepatobiliary: The liver is normal in size and contour. Pancreas: Unremarkable Spleen: Unremarkable Adrenals/Urinary Tract: The adrenal glands are normal. Similar-appearing low-attenuation renal lesions bilaterally, likely representing renal cyst. Incompletely evaluated without contrast material. Urinary bladder is decompressed. Stomach/Bowel: Descending and sigmoid colonic diverticulosis. There is circumferential wall thickening of the cecum, ascending and proximal transverse colon with small amount of adjacent fat stranding. No evidence for bowel obstruction. Normal morphology of the stomach. No free fluid or free intraperitoneal air. Vascular/Lymphatic: Normal caliber abdominal aorta. Peripheral  calcified atherosclerotic plaque. Reproductive: Status post hysterectomy. Other: Fat containing periumbilical hernia. Musculoskeletal: Surgical hardware proximal left femur. Lumbar spine degenerative changes. No aggressive or acute appearing osseous lesions. IMPRESSION: Wall thickening of the cecum, ascending and proximal transverse colon concerning for colitis. Aortic atherosclerosis. Electronically Signed   By: Lovey Newcomer M.D.   On: 11/20/2016 10:31   Ct Head Wo Contrast  Result Date: 11/19/2016 CLINICAL DATA:  Awoke this morning with foot and hand numbness bilaterally. Speech disturbance and confusion. EXAM: CT HEAD WITHOUT CONTRAST TECHNIQUE: Contiguous axial images were obtained from the base of the skull through the vertex without intravenous contrast. COMPARISON:  08/15/2015 other CT and MRI studies going as far back as 12/02/2008. FINDINGS: Brain: Extensive chronic small-vessel ischemic changes affect the cerebral hemispheric white matter. Old appearing small vessel ischemic changes affect the thalami and basal ganglia. No sign of acute infarction, mass lesion, hemorrhage, hydrocephalus or extra-axial collection. 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 The meningocele component is enlarging over time, now completely filling the left division of the sphenoid sinus. Vascular: There is atherosclerotic calcification of the major vessels at the base of the brain. 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 Orbits negative. Other: None significant IMPRESSION: No acute intracranial finding. Extensive chronic small-vessel ischemic changes throughout the brain as outlined above. Chronic left sphenoid encephalocele and  meningocele with enlarging meningocele component now completely opacifying the left division of the sphenoid sinus. Has this been specifically evaluated, with treatment plan? Electronically Signed    By: Nelson Chimes M.D.   On: 11/19/2016 14:44   Mr [name] Nam Head Wo Contrast  Result Date: 11/19/2016 CLINICAL DATA:  Bilateral hand numbness and difficulty speaking EXAM: MRI HEAD WITHOUT CONTRAST MRA HEAD WITHOUT CONTRAST TECHNIQUE: Multiplanar, multiecho pulse sequences of the brain and surrounding structures were obtained without intravenous contrast. Angiographic images of the head were obtained using MRA technique without contrast. COMPARISON:  Head CT 11/19/2016 Brain MRI 11/25/2014 FINDINGS: MRI HEAD FINDINGS Brain: No focal diffusion restriction to indicate acute infarct. No intraparenchymal hemorrhage. There is diffuse confluent hyperintense T2-weighted signal within the periventricular anti white matter, most often seen in the setting of chronic microvascular ischemia. There is a left sphenoid meningoencephalocele that extends into the left division of the sphenoid sinus. This has increased in size compared to the MRI of 11/25/2014. There is also increased expansion of the occupied sphenoid sinus. No mass lesion or midline shift. No hydrocephalus or extra-axial fluid collection. The midline structures are normal. No age advanced or lobar predominant atrophy. Vascular: Major intracranial arterial and venous sinus flow voids are preserved. There are multiple scattered foci of chronic microhemorrhage within both cerebral hemispheres, the brainstem and the cerebellum. Skull and upper cervical spine: The visualized skull base, calvarium, upper cervical spine and extracranial soft tissues are normal. Sinuses/Orbits: No fluid levels or advanced mucosal thickening. No mastoid effusion. Normal orbits. MRA HEAD FINDINGS Intracranial internal carotid arteries: There is narrowing of the proximal and mid P2 segment of the right internal carotid artery. The left ICA is normal. Anterior cerebral arteries: Normal. Middle cerebral arteries: There is mild narrowing of the proximal M1 segment and the proximal M2 segments of  the left middle cerebral artery. The right is normal. Posterior communicating arteries: Present on the right. Posterior cerebral arteries: Normal. Basilar artery: Normal. Vertebral arteries: Codominant. Normal. Superior cerebellar arteries: Normal. Anterior inferior cerebellar arteries: Normal. Posterior inferior cerebellar arteries: Normal on the right. Not clearly seen on the left. IMPRESSION: 1. Advanced chronic microvascular ischemia without acute intracranial abnormality. 2. 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 3. Multifocal mild to moderate narrowing of the left middle cerebral artery M1 and M2 segments, likely atherosclerotic in origin. No intracranial occlusion. 4. Multifocal narrowing of the petrous segment of the right internal carotid artery. Electronically Signed   By: Ulyses Jarred M.D.   On: 11/19/2016 21:57   Mr Brain Wo Contrast  Result Date: 11/19/2016 CLINICAL DATA:  Bilateral hand numbness and difficulty speaking EXAM: MRI HEAD WITHOUT CONTRAST MRA HEAD WITHOUT CONTRAST TECHNIQUE: Multiplanar, multiecho pulse sequences of the brain and surrounding structures were obtained without intravenous contrast. Angiographic images of the head were obtained using MRA technique without contrast. COMPARISON:  Head CT 11/19/2016 Brain MRI 11/25/2014 FINDINGS: MRI HEAD FINDINGS Brain: No focal diffusion restriction to indicate acute infarct. No intraparenchymal hemorrhage. There is diffuse confluent hyperintense T2-weighted signal within the periventricular anti white matter, most often seen in the setting of chronic microvascular ischemia. There is a left sphenoid meningoencephalocele that extends into the left division of the sphenoid sinus. This has increased in size compared to the MRI of 11/25/2014. There is also increased expansion of the occupied sphenoid  sinus. No mass lesion or midline shift. No  hydrocephalus or extra-axial fluid collection. The midline structures are normal. No age advanced or lobar predominant atrophy. Vascular: Major intracranial arterial and venous sinus flow voids are preserved. There are multiple scattered foci of chronic microhemorrhage within both cerebral hemispheres, the brainstem and the cerebellum. Skull and upper cervical spine: The visualized skull base, calvarium, upper cervical spine and extracranial soft tissues are normal. Sinuses/Orbits: No fluid levels or advanced mucosal thickening. No mastoid effusion. Normal orbits. MRA HEAD FINDINGS Intracranial internal carotid arteries: There is narrowing of the proximal and mid P2 segment of the right internal carotid artery. The left ICA is normal. Anterior cerebral arteries: Normal. Middle cerebral arteries: There is mild narrowing of the proximal M1 segment and the proximal M2 segments of the left middle cerebral artery. The right is normal. Posterior communicating arteries: Present on the right. Posterior cerebral arteries: Normal. Basilar artery: Normal. Vertebral arteries: Codominant. Normal. Superior cerebellar arteries: Normal. Anterior inferior cerebellar arteries: Normal. Posterior inferior cerebellar arteries: Normal on the right. Not clearly seen on the left. IMPRESSION: 1. Advanced chronic microvascular ischemia without acute intracranial abnormality. 2. 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 3. Multifocal mild to moderate narrowing of the left middle cerebral artery M1 and M2 segments, likely atherosclerotic in origin. No intracranial occlusion. 4. Multifocal narrowing of the petrous segment of the right internal carotid artery. Electronically Signed   By: Ulyses Jarred M.D.   On: 11/19/2016 21:57      Subjective: Feeling  better. No further nausea or vomiting. No headache.  Discharge Exam: Vitals:   11/20/16 1533 11/20/16 1618  BP: (!) 145/73   Pulse:    Resp:    Temp:  98 F (36.7 C)   Vitals:   11/20/16 1445 11/20/16 1530 11/20/16 1533 11/20/16 1618  BP:  (!) 145/73 (!) 145/73   Pulse:      Resp:      Temp: 98.5 F (36.9 C)   98 F (36.7 C)  TempSrc: Oral   Oral  SpO2:      Weight:      Height:        General: Pt is alert, awake, not in acute distress Cardiovascular: RRR, S1/S2 +, no rubs, no gallops Respiratory: CTA bilaterally, no wheezing, no rhonchi Abdominal: Soft, NT, ND, bowel sounds + Extremities: no edema, no cyanosis    The results of significant diagnostics from this hospitalization (including imaging, microbiology, ancillary and laboratory) are listed below for reference.     Microbiology: Recent Results (from the past 240 hour(s))  MRSA PCR Screening     Status: None   Collection Time: 11/19/16  6:40 PM  Result Value Ref Range Status   MRSA by PCR NEGATIVE NEGATIVE Final    Comment:        The GeneXpert MRSA Assay (FDA approved for NASAL specimens only), is one component of a comprehensive MRSA colonization surveillance program. It is not intended to diagnose MRSA infection nor to guide or monitor treatment for MRSA infections.      Labs: BNP (last 3 results) No results for input(s): BNP in the last 8760 hours. Basic Metabolic Panel:  Recent Labs Lab 11/19/16 1434 11/19/16 2019 11/20/16 0424  NA 136  --  137  K 2.8*  --  3.4*  CL 98*  --  102  CO2 25  --  21*  GLUCOSE 86  --  76  BUN 18  --  22*  CREATININE 6.09* 6.40* 7.02*  CALCIUM 8.8*  --  8.4*  MG  --  1.7  --    Liver Function Tests:  Recent Labs Lab 11/19/16 1434  AST 18  ALT 11*  ALKPHOS 67  BILITOT 0.8  PROT 7.8  ALBUMIN 3.7    Recent Labs Lab 11/19/16 2019  LIPASE 50   No results for input(s): AMMONIA in the last 168 hours. CBC:  Recent Labs Lab 11/19/16 1316  11/20/16 0424  WBC 5.3 5.2  NEUTROABS 3.3  --   HGB 13.1 11.5*  HCT 40.6 35.8*  MCV 78.2 77.8*  PLT 210 186   Cardiac Enzymes: No results for input(s): CKTOTAL, CKMB, CKMBINDEX, TROPONINI in the last 168 hours. BNP: Invalid input(s): POCBNP CBG:  Recent Labs Lab 11/19/16 1119  GLUCAP 84   D-Dimer No results for input(s): DDIMER in the last 72 hours. Hgb A1c No results for input(s): HGBA1C in the last 72 hours. Lipid Profile No results for input(s): CHOL, HDL, LDLCALC, TRIG, CHOLHDL, LDLDIRECT in the last 72 hours. Thyroid function studies  Recent Labs  11/19/16 2019  TSH 0.563   Anemia work up  Recent Labs  11/19/16 2019  VITAMINB12 1,149*   Urinalysis    Component Value Date/Time   COLORURINE YELLOW 04/14/2015 [place] 04/14/2015 1427   LABSPEC 1.020 04/14/2015 1427   PHURINE 8.5 (H) 04/14/2015 1427   GLUCOSEU 100 (A) 04/14/2015 1427   HGBUR SMALL (A) 04/14/2015 1427   BILIRUBINUR NEGATIVE 04/14/2015 1427   KETONESUR NEGATIVE 04/14/2015 1427   PROTEINUR >300 (A) 04/14/2015 1427   UROBILINOGEN 0.2 04/14/2015 1427   NITRITE NEGATIVE 04/14/2015 1427   LEUKOCYTESUR TRACE (A) 04/14/2015 1427   Sepsis Labs Invalid input(s): PROCALCITONIN,  WBC,  LACTICIDVEN Microbiology Recent Results (from the past 240 hour(s))  MRSA PCR Screening     Status: None   Collection Time: 11/19/16  6:40 PM  Result Value Ref Range Status   MRSA by PCR NEGATIVE NEGATIVE Final    Comment:        The GeneXpert MRSA Assay (FDA approved for NASAL specimens only), is one component of a comprehensive MRSA colonization surveillance program. It is not intended to diagnose MRSA infection nor to guide or monitor treatment for MRSA infections.      Time coordinating discharge: Over 30 minutes  SIGNED:   Kathie Dike, MD  [facility] 11/20/2016, 7:11 PM Pager   If 7PM-7AM, please contact night-coverage [email or web address] Password [email or web address]

## 2016-11-20 NOTE — Progress Notes (Signed)
Pt taken to CT.

## 2016-11-20 NOTE — Consult Note (Signed)
Reason for Consult: End-stage renal disease Referring Physician: Dr. Eddie [name] is an 66 y.o. female.  HPI: She is a patient who has history of hypertension, CVA, end-stage renal disease on maintenance hemodialysis presently came with complaints of nausea, vomiting and abdominal pain since yesterday morning. Patient denies any diarrhea. She denies also any fever or chills or sweating. Presently patient states that she is feeling somewhat better. She also complains of weakness and some tingling sensation of her fingers yesterday. Her last dialysis was on Thursday. Presently she denies any difficulty breathing.  Past Medical History:  Diagnosis Date  . Anemia   . CKD (chronic kidney disease)   . Diverticulitis   . Dysphagia   . ESRD (end stage renal disease) ([place])    On HD  . Fracture of femoral neck, left, closed ([place]) 05/30/2015  . Gastritis 06/2013 & 05/2015  . Gastroesophageal reflux disease with stricture 05/2015  . GERD (gastroesophageal reflux disease)   . HTN (hypertension)   . Hyperparathyroidism due to renal insufficiency ([place])   . Hypertensive urgency 06/2013 & 04/2015  . Hypothyroidism   . ICH (intracerebral hemorrhage) ([place]) 10/2014  . 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 Protein calorie malnutrition ([place])   . Stroke ([place])   . Uterine cancer ([place]) 2012  . Vertical diplopia   . Vertigo     Past Surgical History:  Procedure Laterality Date  . ABDOMINAL HYSTERECTOMY    . APPENDECTOMY    . BASCILIC VEIN TRANSPOSITION Right 09/17/2013   Procedure: BRACHIAL VEIN TRANSPOSITION;  Surgeon: Conrad Cary, MD;  Location: [facility];  Service: Vascular;  Laterality: Right;  . BASCILIC VEIN TRANSPOSITION Right 10/29/2013   Procedure: RIGHT SECOND STAGE BRACHIAL VEIN TRANSPOSITION;  Surgeon: Conrad Haydenville, MD;  Location: [facility];  Service: Vascular;  Laterality: Right;  . CESAREAN SECTION    . CHOLECYSTECTOMY    . COLONOSCOPY  2000   TICS, IH  . COLONOSCOPY  2003 NUR BRBPR D50 V6   DC/[facility]  TICS, IH  . COLONOSCOPY N/A 09/04/2013   SLF: Small ulcer in the descending colon, one colon polyp (tubular adenoma), large internal hemorrhoids, moderate diverticulosis. next tcs 10 years.  . ESOPHAGOGASTRODUODENOSCOPY N/A 05/23/2015   TKP:TWSF non-erosive gastritis/stricture at the gastroesophageal junction  . ESOPHAGOGASTRODUODENOSCOPY (EGD) WITH ESOPHAGEAL DILATION N/A 06/08/2013   Dr. Fields:Stricture at the gastroesophageal junction/MILD NON-erosive gastritis (inflammation) was found in the gastric antrum; multiple biopsies/NO SOURCE FOR ANEMIA IDETIFIED-MOST LIKELY DUE TO ANEMIA OF CHRONIC DISEASE  . FLEXIBLE SIGMOIDOSCOPY N/A 01/02/2016   Dr. Oneida Alar: external hemorrhoids, diverticulosis, internal hemorrhoids, proximal rectum normal  . GIVENS CAPSULE STUDY N/A 12/03/2013   SLF: occasional gastric erosion. small bowel normal  . HIP PINNING,CANNULATED Left 05/31/2015   Procedure: CANNULATED HIP PINNING;  Surgeon: Leandrew Koyanagi, MD;  Location: [facility];  Service: Orthopedics;  Laterality: Left;  . INSERTION OF DIALYSIS CATHETER Left 09/17/2013   Procedure: INSERTION OF DIALYSIS CATHETER;  Surgeon: Conrad Shenandoah, MD;  Location: [facility];  Service: Vascular;  Laterality: Left;  . LAPAROSCOPIC TOTAL HYSTERECTOMY    . NASAL SEPTUM SURGERY    . SAVORY DILATION N/A 05/23/2015   Procedure: SAVORY DILATION;  Surgeon: Danie Binder, MD;  [facility];  Service: Endoscopy;  Laterality: N/A;    Family History  Problem Relation Age of Onset  . Cancer Mother   . Diabetes Mother   . Hypertension Mother   . Diabetes Father   . Hypertension Father   .  Hypertension Sister   . Colon cancer Neg Hx     Social History:  reports that she has never smoked. She has never used smokeless tobacco. She reports that she does not drink alcohol or use drugs.  Allergies:  Allergies  Allergen Reactions  . Cephalexin Itching    Medications: I have reviewed the patient's current medications.  Results for  orders placed or performed during the hospital encounter of 11/19/16 (from the past 48 hour(s))  CBG monitoring, ED     Status: None   Collection Time: 11/19/16 11:19 AM  Result Value Ref Range   Glucose-Capillary 84 65 - 99 mg/dL  CBC with Differential     Status: Abnormal   Collection Time: 11/19/16  1:16 PM  Result Value Ref Range   WBC 5.3 4.0 - 10.5 K/uL   RBC 5.19 (H) 3.87 - 5.11 MIL/uL   Hemoglobin 13.1 12.0 - 15.0 g/dL   HCT 40.6 36.0 - 46.0 %   MCV 78.2 78.0 - 100.0 fL   MCH 25.2 (L) 26.0 - 34.0 pg   MCHC 32.3 30.0 - 36.0 g/dL   RDW 18.1 (H) 11.5 - 15.5 %   Platelets 210 150 - 400 K/uL    Comment: PLATELET COUNT CONFIRMED BY SMEAR   Neutrophils Relative % 63 %   Neutro Abs 3.3 1.7 - 7.7 K/uL   Lymphocytes Relative 28 %   Lymphs Abs 1.5 0.7 - 4.0 K/uL   Monocytes Relative 8 %   Monocytes Absolute 0.4 0.1 - 1.0 K/uL   Eosinophils Relative 1 %   Eosinophils Absolute 0.0 0.0 - 0.7 K/uL   Basophils Relative 0 %   Basophils Absolute 0.0 0.0 - 0.1 K/uL   RBC Morphology ANISOCYTES     Comment: TARGET CELLS POLYCHROMASIA PRESENT BURR CELLS   Comprehensive metabolic panel     Status: Abnormal   Collection Time: 11/19/16  2:34 PM  Result Value Ref Range   Sodium 136 135 - 145 mmol/L   Potassium 2.8 (L) 3.5 - 5.1 mmol/L   Chloride 98 (L) 101 - 111 mmol/L   CO2 25 22 - 32 mmol/L   Glucose, Bld 86 65 - 99 mg/dL   BUN 18 6 - 20 mg/dL   Creatinine, Ser 6.09 (H) 0.44 - 1.00 mg/dL   Calcium 8.8 (L) 8.9 - 10.3 mg/dL   Total Protein 7.8 6.5 - 8.1 g/dL   Albumin 3.7 3.5 - 5.0 g/dL   AST 18 15 - 41 U/L   ALT 11 (L) 14 - 54 U/L   Alkaline Phosphatase 67 38 - 126 U/L   Total Bilirubin 0.8 0.3 - 1.2 mg/dL   GFR calc non Af Amer 6 (L) >60 mL/min   GFR calc Af Amer 7 (L) >60 mL/min    Comment: (NOTE) The eGFR has been calculated using the CKD EPI equation. This calculation has not been validated in all clinical situations. eGFR's persistently <60 mL/min signify possible Chronic  Kidney Disease.    Anion gap 13 5 - 15  MRSA PCR Screening     Status: None   Collection Time: 11/19/16  6:40 PM  Result Value Ref Range   MRSA by PCR NEGATIVE NEGATIVE    Comment:        The GeneXpert MRSA Assay (FDA approved for NASAL specimens only), is one component of a comprehensive MRSA colonization surveillance program. It is not intended to diagnose MRSA infection nor to guide or monitor treatment for MRSA infections.  Creatinine, serum     Status: Abnormal   Collection Time: 11/19/16  8:19 PM  Result Value Ref Range   Creatinine, Ser 6.40 (H) 0.44 - 1.00 mg/dL   GFR calc non Af Amer 6 (L) >60 mL/min   GFR calc Af Amer 7 (L) >60 mL/min    Comment: (NOTE) The eGFR has been calculated using the CKD EPI equation. This calculation has not been validated in all clinical situations. eGFR's persistently <60 mL/min signify possible Chronic Kidney Disease.   Lipase, blood     Status: None   Collection Time: 11/19/16  8:19 PM  Result Value Ref Range   Lipase 50 11 - 51 U/L  TSH     Status: None   Collection Time: 11/19/16  8:19 PM  Result Value Ref Range   TSH 0.563 0.350 - 4.500 uIU/mL    Comment: Performed by a 3rd Generation assay with a functional sensitivity of <=0.01 uIU/mL.  Magnesium     Status: None   Collection Time: 11/19/16  8:19 PM  Result Value Ref Range   Magnesium 1.7 1.7 - 2.4 mg/dL  Basic metabolic panel     Status: Abnormal   Collection Time: 11/20/16  4:24 AM  Result Value Ref Range   Sodium 137 135 - 145 mmol/L   Potassium 3.4 (L) 3.5 - 5.1 mmol/L    Comment: DELTA CHECK NOTED NO VISIBLE HEMOLYSIS    Chloride 102 101 - 111 mmol/L   CO2 21 (L) 22 - 32 mmol/L   Glucose, Bld 76 65 - 99 mg/dL   BUN 22 (H) 6 - 20 mg/dL   Creatinine, Ser 7.02 (H) 0.44 - 1.00 mg/dL   Calcium 8.4 (L) 8.9 - 10.3 mg/dL   GFR calc non Af Amer 5 (L) >60 mL/min   GFR calc Af Amer 6 (L) >60 mL/min    Comment: (NOTE) The eGFR has been calculated using the CKD EPI  equation. This calculation has not been validated in all clinical situations. eGFR's persistently <60 mL/min signify possible Chronic Kidney Disease.    Anion gap 14 5 - 15  CBC     Status: Abnormal   Collection Time: 11/20/16  4:24 AM  Result Value Ref Range   WBC 5.2 4.0 - 10.5 K/uL   RBC 4.60 3.87 - 5.11 MIL/uL    Comment: SCHISTOCYTES NOTED ON SMEAR POLYCHROMASIA PRESENT    Hemoglobin 11.5 (L) 12.0 - 15.0 g/dL   HCT 35.8 (L) 36.0 - 46.0 %   MCV 77.8 (L) 78.0 - 100.0 fL   MCH 25.0 (L) 26.0 - 34.0 pg   MCHC 32.1 30.0 - 36.0 g/dL   RDW 17.4 (H) 11.5 - 15.5 %   Platelets 186 150 - 400 K/uL    Comment: SPECIMEN CHECKED FOR CLOTS PLATELET COUNT CONFIRMED BY SMEAR     Ct Head Wo Contrast  Result Date: 11/19/2016 CLINICAL DATA:  Awoke this morning with foot and hand numbness bilaterally. Speech disturbance and confusion. EXAM: CT HEAD WITHOUT CONTRAST TECHNIQUE: Contiguous axial images were obtained from the base of the skull through the vertex without intravenous contrast. COMPARISON:  08/15/2015 other CT and MRI studies going as far back as 12/02/2008. FINDINGS: Brain: Extensive chronic small-vessel ischemic changes affect the cerebral hemispheric white matter. Old appearing small vessel ischemic changes affect the thalami and basal ganglia. No sign of acute infarction, mass lesion, hemorrhage, hydrocephalus or extra-axial collection. 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 The meningocele component is  enlarging over time, now completely filling the left division of the sphenoid sinus. Vascular: There is atherosclerotic calcification of the major vessels at the base of the brain. 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 Orbits negative. Other: None significant IMPRESSION: No acute intracranial finding. Extensive chronic small-vessel ischemic changes throughout the brain as outlined  above. 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 Has this been specifically evaluated, with treatment plan? Electronically Signed   By: Sonya Chimes M.D.   On: 11/19/2016 14:44   Mr Sonya [name] Head Wo Contrast  Result Date: 11/19/2016 CLINICAL DATA:  Bilateral hand numbness and difficulty speaking EXAM: MRI HEAD WITHOUT CONTRAST MRA HEAD WITHOUT CONTRAST TECHNIQUE: Multiplanar, multiecho pulse sequences of the brain and surrounding structures were obtained without intravenous contrast. Angiographic images of the head were obtained using MRA technique without contrast. COMPARISON:  Head CT 11/19/2016 Brain MRI 11/25/2014 FINDINGS: MRI HEAD FINDINGS Brain: No focal diffusion restriction to indicate acute infarct. No intraparenchymal hemorrhage. There is diffuse confluent hyperintense T2-weighted signal within the periventricular anti white matter, most often seen in the setting of chronic microvascular ischemia. There is a left sphenoid meningoencephalocele that extends into the left division of the sphenoid sinus. This has increased in size compared to the MRI of 11/25/2014. There is also increased expansion of the occupied sphenoid sinus. No mass lesion or midline shift. No hydrocephalus or extra-axial fluid collection. The midline structures are normal. No age advanced or lobar predominant atrophy. Vascular: Major intracranial arterial and venous sinus flow voids are preserved. There are multiple scattered foci of chronic microhemorrhage within both cerebral hemispheres, the brainstem and the cerebellum. Skull and upper cervical spine: The visualized skull base, calvarium, upper cervical spine and extracranial soft tissues are normal. Sinuses/Orbits: No fluid levels or advanced mucosal thickening. No mastoid effusion. Normal orbits. MRA HEAD FINDINGS Intracranial internal carotid arteries: There is  narrowing of the proximal and mid P2 segment of the right internal carotid artery. The left ICA is normal. Anterior cerebral arteries: Normal. Middle cerebral arteries: There is mild narrowing of the proximal M1 segment and the proximal M2 segments of the left middle cerebral artery. The right is normal. Posterior communicating arteries: Present on the right. Posterior cerebral arteries: Normal. Basilar artery: Normal. Vertebral arteries: Codominant. Normal. Superior cerebellar arteries: Normal. Anterior inferior cerebellar arteries: Normal. Posterior inferior cerebellar arteries: Normal on the right. Not clearly seen on the left. IMPRESSION: 1. Advanced chronic microvascular ischemia without acute intracranial abnormality. 2. 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 3. Multifocal mild to moderate narrowing of the left middle cerebral artery M1 and M2 segments, likely atherosclerotic in origin. No intracranial occlusion. 4. Multifocal narrowing of the petrous segment of the right internal carotid artery. Electronically Signed   By: Sonya Jarred M.D.   On: 11/19/2016 21:57   Mr Brain Wo Contrast  Result Date: 11/19/2016 CLINICAL DATA:  Bilateral hand numbness and difficulty speaking EXAM: MRI HEAD WITHOUT CONTRAST MRA HEAD WITHOUT CONTRAST TECHNIQUE: Multiplanar, multiecho pulse sequences of the brain and surrounding structures were obtained without intravenous contrast. Angiographic images of the head were obtained using MRA technique without contrast. COMPARISON:  Head CT 11/19/2016 Brain MRI 11/25/2014 FINDINGS: MRI HEAD FINDINGS Brain: No focal diffusion restriction to indicate acute infarct. No intraparenchymal hemorrhage.  There is diffuse confluent hyperintense T2-weighted signal within the periventricular anti white matter, most often seen in the setting of chronic  microvascular ischemia. There is a left sphenoid meningoencephalocele that extends into the left division of the sphenoid sinus. This has increased in size compared to the MRI of 11/25/2014. There is also increased expansion of the occupied sphenoid sinus. No mass lesion or midline shift. No hydrocephalus or extra-axial fluid collection. The midline structures are normal. No age advanced or lobar predominant atrophy. Vascular: Major intracranial arterial and venous sinus flow voids are preserved. There are multiple scattered foci of chronic microhemorrhage within both cerebral hemispheres, the brainstem and the cerebellum. Skull and upper cervical spine: The visualized skull base, calvarium, upper cervical spine and extracranial soft tissues are normal. Sinuses/Orbits: No fluid levels or advanced mucosal thickening. No mastoid effusion. Normal orbits. MRA HEAD FINDINGS Intracranial internal carotid arteries: There is narrowing of the proximal and mid P2 segment of the right internal carotid artery. The left ICA is normal. Anterior cerebral arteries: Normal. Middle cerebral arteries: There is mild narrowing of the proximal M1 segment and the proximal M2 segments of the left middle cerebral artery. The right is normal. Posterior communicating arteries: Present on the right. Posterior cerebral arteries: Normal. Basilar artery: Normal. Vertebral arteries: Codominant. Normal. Superior cerebellar arteries: Normal. Anterior inferior cerebellar arteries: Normal. Posterior inferior cerebellar arteries: Normal on the right. Not clearly seen on the left. IMPRESSION: 1. Advanced chronic microvascular ischemia without acute intracranial abnormality. 2. 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 3. Multifocal mild to moderate narrowing of the left middle cerebral artery M1 and M2 segments,  likely atherosclerotic in origin. No intracranial occlusion. 4. Multifocal narrowing of the petrous segment of the right internal carotid artery. Electronically Signed   By: Sonya Jarred M.D.   On: 11/19/2016 21:57    Review of Systems  Constitutional: Positive for malaise/fatigue. Negative for fever.  Respiratory: Negative for cough and shortness of breath.   Cardiovascular: Negative for chest pain, orthopnea and leg swelling.  Gastrointestinal: Positive for abdominal pain, nausea and vomiting. Negative for diarrhea.  Neurological: Positive for weakness.   Blood pressure (!) 157/74, pulse (!) 103, temperature 98.8 F (37.1 C), temperature source Oral, resp. rate 20, height '5\' 7"'$  (1.702 m), weight 67.5 kg (148 lb 13 oz), SpO2 99 %. Physical Exam  Constitutional: No distress.  Eyes: No scleral icterus.  Neck: No JVD present.  Cardiovascular: Normal rate and regular rhythm.   Respiratory: No respiratory distress. She has no wheezes. She has no rales.  GI: She exhibits no distension. There is no tenderness. There is no rebound.  Musculoskeletal: She exhibits no edema.    Assessment/Plan: Problem #1 nausea/vomiting and abdominal pain. Etiology at this moment not clear. Patient denies any diarrhea, no fever chills or sweating. Problem #2 hypertension: Her blood pressure was very high when she came mostly because she was not able to take her medications orally. Her blood pressure normally is controlled very well. Presently her blood pressure is somewhat better. Problem #3 end-stage renal disease: Patient is status post hemodialysis on Thursday. She is due for dialysis today. Problem #4 hypokalemia: Possibly from low dialysate associated with nausea and vomiting and poor by mouth intake. Her potassium is slightly better. Problem #5 anemia: Her hemoglobin is within our target goal. Problem #6 metabolic bone disease: Her calcium is range. Problem #  7 fluid management: Patient doesn't have  significant sign of fluid overload Plan: 1] We'll make a regimen for patient to get dialysis today possibly after CAT scan. 2] We'll use 4k//2.5 calcium bath 3] we'll check CBC and renal panel in the morning   Sri Clegg S 11/20/2016, 9:01 AM

## 2016-11-23 DIAGNOSIS — D509 Iron deficiency anemia, unspecified: Secondary | ICD-10-CM | POA: Diagnosis not present

## 2016-11-23 DIAGNOSIS — N2581 Secondary hyperparathyroidism of renal origin: Secondary | ICD-10-CM | POA: Diagnosis not present

## 2016-11-23 DIAGNOSIS — D631 Anemia in chronic kidney disease: Secondary | ICD-10-CM | POA: Diagnosis not present

## 2016-11-23 DIAGNOSIS — N186 End stage renal disease: Secondary | ICD-10-CM | POA: Diagnosis not present

## 2016-11-23 DIAGNOSIS — R112 Nausea with vomiting, unspecified: Secondary | ICD-10-CM | POA: Diagnosis not present

## 2016-11-23 DIAGNOSIS — Z992 Dependence on renal dialysis: Secondary | ICD-10-CM | POA: Diagnosis not present

## 2016-11-24 DIAGNOSIS — Z1389 Encounter for screening for other disorder: Secondary | ICD-10-CM | POA: Diagnosis not present

## 2016-11-24 DIAGNOSIS — I10 Essential (primary) hypertension: Secondary | ICD-10-CM | POA: Diagnosis not present

## 2016-11-24 DIAGNOSIS — Z6823 Body mass index (BMI) 23.0-23.9, adult: Secondary | ICD-10-CM | POA: Diagnosis not present

## 2016-11-24 DIAGNOSIS — E876 Hypokalemia: Secondary | ICD-10-CM | POA: Diagnosis not present

## 2016-11-24 DIAGNOSIS — K529 Noninfective gastroenteritis and colitis, unspecified: Secondary | ICD-10-CM | POA: Diagnosis not present

## 2016-11-25 ENCOUNTER — Emergency Department (HOSPITAL_COMMUNITY)
Admission: EM | Admit: 2016-11-25 | Discharge: 2016-11-25 | Disposition: A | Discharge status: Home / Self Care | Payer: Medicare Other | Attending: Emergency Medicine | Admitting: Emergency Medicine

## 2016-11-25 ENCOUNTER — Encounter (HOSPITAL_COMMUNITY): Payer: Self-pay

## 2016-11-25 DIAGNOSIS — N186 End stage renal disease: Secondary | ICD-10-CM | POA: Diagnosis not present

## 2016-11-25 DIAGNOSIS — E785 Hyperlipidemia, unspecified: Secondary | ICD-10-CM | POA: Diagnosis not present

## 2016-11-25 DIAGNOSIS — E860 Dehydration: Secondary | ICD-10-CM | POA: Diagnosis not present

## 2016-11-25 DIAGNOSIS — R112 Nausea with vomiting, unspecified: Secondary | ICD-10-CM | POA: Diagnosis not present

## 2016-11-25 DIAGNOSIS — D509 Iron deficiency anemia, unspecified: Secondary | ICD-10-CM | POA: Diagnosis not present

## 2016-11-25 DIAGNOSIS — N2581 Secondary hyperparathyroidism of renal origin: Secondary | ICD-10-CM | POA: Diagnosis not present

## 2016-11-25 DIAGNOSIS — I120 Hypertensive chronic kidney disease with stage 5 chronic kidney disease or end stage renal disease: Secondary | ICD-10-CM | POA: Diagnosis not present

## 2016-11-25 DIAGNOSIS — Z79899 Other long term (current) drug therapy: Secondary | ICD-10-CM | POA: Diagnosis not present

## 2016-11-25 DIAGNOSIS — R404 Transient alteration of awareness: Secondary | ICD-10-CM | POA: Diagnosis not present

## 2016-11-25 DIAGNOSIS — R42 Dizziness and giddiness: Secondary | ICD-10-CM | POA: Diagnosis not present

## 2016-11-25 DIAGNOSIS — Z992 Dependence on renal dialysis: Secondary | ICD-10-CM | POA: Insufficient documentation

## 2016-11-25 DIAGNOSIS — D631 Anemia in chronic kidney disease: Secondary | ICD-10-CM | POA: Diagnosis not present

## 2016-11-25 LAB — CBC WITH DIFFERENTIAL/PLATELET
BASOS ABS: 0.1 10*3/uL (ref 0.0–0.1)
BASOS PCT: 1 %
Eosinophils Absolute: 0.1 10*3/uL (ref 0.0–0.7)
Eosinophils Relative: 1 %
HEMATOCRIT: 38 % (ref 36.0–46.0)
HEMOGLOBIN: 12.1 g/dL (ref 12.0–15.0)
LYMPHS PCT: 29 %
Lymphs Abs: 1.9 10*3/uL (ref 0.7–4.0)
MCH: 24.8 pg — ABNORMAL LOW (ref 26.0–34.0)
MCHC: 31.8 g/dL (ref 30.0–36.0)
MCV: 78 fL (ref 78.0–100.0)
Monocytes Absolute: 0.9 10*3/uL (ref 0.1–1.0)
Monocytes Relative: 13 %
NEUTROS ABS: 3.8 10*3/uL (ref 1.7–7.7)
NEUTROS PCT: 56 %
Platelets: 267 10*3/uL (ref 150–400)
RBC: 4.87 MIL/uL (ref 3.87–5.11)
RDW: 17.6 % — ABNORMAL HIGH (ref 11.5–15.5)
WBC: 6.8 10*3/uL (ref 4.0–10.5)

## 2016-11-25 LAB — BASIC METABOLIC PANEL
ANION GAP: 13 (ref 5–15)
BUN: 11 mg/dL (ref 6–20)
CHLORIDE: 97 mmol/L — AB (ref 101–111)
CO2: 28 mmol/L (ref 22–32)
Calcium: 8.5 mg/dL — ABNORMAL LOW (ref 8.9–10.3)
Creatinine, Ser: 4.43 mg/dL — ABNORMAL HIGH (ref 0.44–1.00)
GFR calc non Af Amer: 9 mL/min — ABNORMAL LOW (ref >60)
GFR, EST AFRICAN AMERICAN: 11 mL/min — AB (ref >60)
Glucose, Bld: 98 mg/dL (ref 65–99)
POTASSIUM: 2.8 mmol/L — AB (ref 3.5–5.1)
SODIUM: 138 mmol/L (ref 135–145)

## 2016-11-25 LAB — TSH: TSH: 0.417 u[IU]/mL (ref 0.350–4.500)

## 2016-11-25 LAB — ETHANOL

## 2016-11-25 LAB — TROPONIN I: Troponin I: <0.03 ng/mL (ref <0.03)

## 2016-11-25 MED ORDER — POTASSIUM CHLORIDE CRYS ER 20 MEQ PO TBCR
40.0000 meq | EXTENDED_RELEASE_TABLET | Freq: Once | ORAL | Status: AC
  Administered 2016-11-25: 40 meq via ORAL
  Filled 2016-11-25: qty 2

## 2016-11-25 MED ORDER — MECLIZINE HCL 12.5 MG PO TABS
12.5000 mg | ORAL_TABLET | Freq: Once | ORAL | Status: AC
  Administered 2016-11-25: 12.5 mg via ORAL
  Filled 2016-11-25: qty 1

## 2016-11-25 MED ORDER — SODIUM CHLORIDE 0.9 % IV BOLUS (SEPSIS)
500.0000 mL | Freq: Once | INTRAVENOUS | Status: AC
  Administered 2016-11-25: 500 mL via INTRAVENOUS

## 2016-11-25 NOTE — ED Triage Notes (Signed)
Pt brought over by EMS from [place] for dizziness. Pt reports she has had intermittent episodes of dizziness since last Friday. Pt was seen in ED last Saturday for dizziness and BP was high at that time. Seen by dr Gerarda Fraction yesterday for dizziness and labs were done. Pt was on dialysis machine for 2 hours when dizziness started

## 2016-11-25 NOTE — Discharge Instructions (Signed)
You have been seen today in the Emergency Department (ED)  for lightheadedness  Your workup including labs and EKG show reassuring results.  Your symptoms may be due to dehydration, so it is important that you drink plenty of non-alcoholic fluids. You should discuss with your Nephrologist regarding your fluid restriction and if this can be adjusted to allow for more fluids.   Please call your regular doctor as soon as possible to schedule the next available clinic appointment to follow up with him/her regarding your visit to the ED and your symptoms.  Return to the Emergency Department (ED)  if you have any further syncopal episodes (pass out again) or develop ANY chest pain, pressure, tightness, trouble breathing, sudden sweating, or other symptoms that concern you.

## 2016-11-25 NOTE — ED Provider Notes (Signed)
[facility] DEPT Provider Note   CSN: [id number] Arrival date & time: 11/25/16  0908     History   Chief Complaint Chief Complaint  Patient presents with  . Dizziness    HPI Sonya [name] is a 66 y.o. female.  HPI   66 year old female with history of end-stage renal disease currently on Tuesday/Thursday/Saturday dialysis, hypertension, anemia, gastric reflux, hypothyroidism, vertigo brought here via EMS for evaluation of dizziness. Patient states that 6 days ago she was seen in the ED for complaints of dizziness. This started after she had "a stomach virus" she was admitted to the hospital and subsequently discharged with Cipro and Flagyl. She report that her symptoms never fully resolved. She she describes symptoms as lightheadedness, feeling like she's going to pass out with generalized weakness. She denies room spinning sensation. She denies any associated pain, no headache, no diplopia, chest pain, shortness of breath, abdominal pain, back pain, dysuria, focal numbness or weakness. She was at dialysis center today and finished approximately 2 hours of her treatment but told the staff that she is feeling dizzy and  was brought here for further evaluation. She admits that her appetite has been decreased and having been eating drinking as much.  Past Medical History:  Diagnosis Date  . Anemia   . CKD (chronic kidney disease)   . Diverticulitis   . Dysphagia   . ESRD (end stage renal disease) ([place])    On HD  . Fracture of femoral neck, left, closed ([place]) 05/30/2015  . Gastritis 06/2013 & 05/2015  . Gastroesophageal reflux disease with stricture 05/2015  . GERD (gastroesophageal reflux disease)   . HTN (hypertension)   . 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 Hypertensive urgency 06/2013 & 04/2015  . Hypothyroidism   . ICH (intracerebral hemorrhage) ([place]) 10/2014  . 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 Protein calorie malnutrition ([place])   . Stroke ([place])   . Uterine  cancer ([place]) 2012  . Vertical diplopia   . Vertigo     Patient Active Problem List   Diagnosis Date Noted  . 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 Colitis, acute 11/20/2016  . ESRD (end stage renal disease) ([place])   . Weakness   . Abnormal weight loss 12/01/2015  . Stomach growling 12/01/2015  . Pulmonary edema 08/19/2015  . CAP (community acquired pneumonia) 08/19/2015  . Hypothyroidism, adult 06/14/2015  . HA (headache) 06/13/2015  . HTN (hypertension), malignant 06/13/2015  . Elective surgery   . Fracture of femoral neck, left, closed ([place]) 05/30/2015  . Hip fracture ([place]) 05/30/2015  . Closed fracture of neck of left femur ([place])   . Loss of weight   . Emesis   . Nausea with vomiting 05/13/2015  . Nausea and vomiting 04/14/2015  . UTI (lower urinary tract infection) 04/14/2015  . Orthostatic hypotension 11/27/2014  . Accelerated hypertension 11/25/2014  . Dizziness   . Uncontrolled hypertension   . Acute encephalopathy   . Confusion   . Protein-calorie malnutrition, severe ([place]) 10/15/2014  . Dysphagia 10/15/2014  . Hypertensive emergency 10/14/2014  . Hypertensive urgency, malignant 10/14/2014  . Intracerebral bleed ([place]) 10/14/2014  . End stage renal disease ([place]) 08/31/2013  . Symptomatic anemia 08/18/2013  . Hyperparathyroidism due to renal insufficiency ([place]) 07/09/2013  . Unspecified constipation 06/21/2013  . Gastritis 06/21/2013  . Anemia 06/20/2013  . Dizzy 06/20/2013  . Chronic kidney disease 06/20/2013  . Anemia in chronic kidney disease 06/08/2013  . Regurgitation 06/08/2013  . Hypokalemia 06/08/2013  .  Gout 06/08/2013  . Other dysphagia 06/07/2013  . Hypertensive urgency 06/07/2013  . Borborygmi 04/08/2011  . Essential hypertension 04/08/2011  . Colon cancer screening 04/08/2011    Past Surgical History:  Procedure Laterality Date  . ABDOMINAL HYSTERECTOMY    . APPENDECTOMY    . BASCILIC VEIN TRANSPOSITION Right 09/17/2013   Procedure:  BRACHIAL VEIN TRANSPOSITION;  Surgeon: Conrad Magness, MD;  Location: [facility];  Service: Vascular;  Laterality: Right;  . BASCILIC VEIN TRANSPOSITION Right 10/29/2013   Procedure: RIGHT SECOND STAGE BRACHIAL VEIN TRANSPOSITION;  Surgeon: Conrad Gillham, MD;  Location: [facility];  Service: Vascular;  Laterality: Right;  . CESAREAN SECTION    . CHOLECYSTECTOMY    . COLONOSCOPY  2000   TICS, IH  . COLONOSCOPY  2003 NUR BRBPR D50 V6   DC/[place] TICS, IH  . COLONOSCOPY N/A 09/04/2013   SLF: Small ulcer in the descending colon, one colon polyp (tubular adenoma), large internal hemorrhoids, moderate diverticulosis. next tcs 10 years.  . ESOPHAGOGASTRODUODENOSCOPY N/A 05/23/2015   IPJ:ASNK non-erosive gastritis/stricture at the gastroesophageal junction  . ESOPHAGOGASTRODUODENOSCOPY (EGD) WITH ESOPHAGEAL DILATION N/A 06/08/2013   Dr. Fields:Stricture at the gastroesophageal junction/MILD NON-erosive gastritis (inflammation) was found in the gastric antrum; multiple biopsies/NO SOURCE FOR ANEMIA IDETIFIED-MOST LIKELY DUE TO ANEMIA OF CHRONIC DISEASE  . FLEXIBLE SIGMOIDOSCOPY N/A 01/02/2016   Dr. Oneida Alar: external hemorrhoids, diverticulosis, internal hemorrhoids, proximal rectum normal  . GIVENS CAPSULE STUDY N/A 12/03/2013   SLF: occasional gastric erosion. small bowel normal  . HIP PINNING,CANNULATED Left 05/31/2015   Procedure: CANNULATED HIP PINNING;  Surgeon: Leandrew Koyanagi, MD;  Location: [facility];  Service: Orthopedics;  Laterality: Left;  . INSERTION OF DIALYSIS CATHETER Left 09/17/2013   Procedure: INSERTION OF DIALYSIS CATHETER;  Surgeon: Conrad Puako, MD;  Location: [facility];  Service: Vascular;  Laterality: Left;  . LAPAROSCOPIC TOTAL HYSTERECTOMY    . NASAL SEPTUM SURGERY    . SAVORY DILATION N/A 05/23/2015   Procedure: SAVORY DILATION;  Surgeon: Danie Binder, MD;  [facility];  Service: Endoscopy;  Laterality: N/A;    OB History    Gravida Para Term Preterm AB Living   1 1 1          SAB TAB  Ectopic Multiple Live Births                   Home Medications    Prior to Admission medications   Medication Sig Start Date End Date Taking? Authorizing Provider  alendronate (FOSAMAX) 70 MG tablet Take 70 mg by mouth once a week. Take with a full glass of water on an empty stomach.    Historical Provider, MD  allopurinol (ZYLOPRIM) 300 MG tablet Take 0.5 tablets (150 mg total) by mouth daily. Dose was decreased secondary to renal failure. 06/16/15   Rexene Alberts, MD  ciprofloxacin (CIPRO) 500 MG tablet Take 1 tablet (500 mg total) by mouth daily. 11/21/16   Kathie Dike, MD  doxazosin (CARDURA) 8 MG tablet Take 1 tablet (8 mg total) by mouth daily. 08/24/15   Samuella Cota, MD  levothyroxine (SYNTHROID, LEVOTHROID) 50 MCG tablet Take 1 tablet (50 mcg total) by mouth daily. 04/17/15   Samuella Cota, MD  lidocaine-prilocaine (EMLA) cream Apply 1 application topically every Tuesday, Thursday, and Saturday at 6 PM.     Historical Provider, MD  lisinopril (PRINIVIL,ZESTRIL) 40 MG tablet Take 1 tablet (40 mg total) by mouth daily. 06/16/15   Rexene Alberts,  MD  meclizine (ANTIVERT) 25 MG tablet Take 1 tablet by mouth every 6 (six) hours as needed for dizziness. dizziness 11/22/14   Historical Provider, MD  metoCLOPramide (REGLAN) 5 MG tablet 1 po before breakfast. 01/02/16   Danie Binder, MD  metroNIDAZOLE (FLAGYL) 500 MG tablet Take 1 tablet (500 mg total) by mouth every 8 (eight) hours. 11/20/16   Kathie Dike, MD  NIFEdipine (PROCARDIA XL/ADALAT-CC) 90 MG 24 hr tablet Take 90 mg by mouth daily. 04/10/15   Historical Provider, MD  omeprazole (PRILOSEC) 20 MG capsule TAKE ONE CAPSULE BY MOUTH ONCE DAILY 05/18/16   Annitta Needs, NP  sevelamer carbonate (RENVELA) 800 MG tablet Take 2,400 mg by mouth 3 (three) times daily with meals.     Historical Provider, MD    Family History Family History  Problem Relation Age of Onset  . Cancer Mother   . Diabetes Mother   . Hypertension Mother     . Diabetes Father   . Hypertension Father   . Hypertension Sister   . Colon cancer Neg Hx     Social History Social History  Substance Use Topics  . Smoking status: Never Smoker  . Smokeless tobacco: Never Used  . Alcohol use No     Allergies   Cephalexin   Review of Systems Review of Systems  All other systems reviewed and are negative.    Physical Exam Updated Vital Signs BP 114/61 (BP Location: Left Arm)   Pulse 88   Temp 97.6 F (36.4 C) (Oral)   Resp (!) 24   Ht 5\' 4"  (1.626 m)   Wt 67.1 kg   SpO2 100%   BMI 25.40 kg/m   Physical Exam  Constitutional: She is oriented to person, place, and time. She appears well-developed and well-nourished. No distress.  HENT:  Head: Atraumatic.  Right Ear: External ear normal.  Left Ear: External ear normal.  Eyes: Conjunctivae and EOM are normal. Pupils are equal, round, and reactive to light.  Neck: Normal range of motion. Neck supple.  No nuchal rigidity  Cardiovascular: Normal rate, regular rhythm and intact distal pulses.   Pulmonary/Chest: Effort normal and breath sounds normal.  Abdominal: Soft. Bowel sounds are normal. She exhibits no distension. There is no tenderness.  Musculoskeletal: She exhibits no edema.  Weak but equal strength to all 4 extremities  Neurological: She is alert and oriented to person, place, and time.  Skin: No rash noted.  Functional AV fistula to right upper arm  Psychiatric: She has a normal mood and affect.  Nursing note and vitals reviewed.    ED Treatments / Results  Labs (all labs ordered are listed, but only abnormal results are displayed) Labs Reviewed  CBC WITH DIFFERENTIAL/PLATELET - Abnormal; Notable for the following:       Result Value   MCH 24.8 (*)    RDW 17.6 (*)    All other components within normal limits  BASIC METABOLIC PANEL - Abnormal; Notable for the following:    Potassium 2.8 (*)    Chloride 97 (*)    Creatinine, Ser 4.43 (*)    Calcium 8.5 (*)     GFR calc non Af Amer 9 (*)    GFR calc Af Amer 11 (*)    All other components within normal limits  TROPONIN I  ETHANOL  TSH  URINALYSIS, ROUTINE W REFLEX MICROSCOPIC    EKG  EKG Interpretation  Date/Time:  Thursday November 25 2016 09:13:43 EDT Ventricular Rate:  85 PR Interval:    QRS Duration: 93 QT Interval:  492 QTC Calculation: 586 R Axis:   68 Text Interpretation:  Sinus rhythm Prominent P waves, nondiagnostic Left ventricular hypertrophy Prolonged QT interval Baseline wander in lead(s) II aVF Similar to prior. No STEMI.  Confirmed by LONG MD, JOSHUA [phone]) on 11/25/2016 9:17:50 AM       Radiology No results found.  Procedures Procedures (including critical care time)  10:04 Orthostatic Vital Signs VP  Orthostatic Lying   BP- Lying: 124/63  Pulse- Lying: 90      Orthostatic Sitting  BP- Sitting: 125/66  Pulse- Sitting: 95      Orthostatic Standing at 0 minutes  BP- Standing at 0 minutes:  127/104 (patient was very dizzy upon standing which made it difficult to obtain accurate b/p )  Pulse- Standing at 0 minutes: 102     Medications Ordered in ED Medications  meclizine (ANTIVERT) tablet 12.5 mg (12.5 mg Oral Given 11/25/16 1119)  potassium chloride SA (K-DUR,KLOR-CON) CR tablet 40 mEq (40 mEq Oral Given 11/25/16 1119)  sodium chloride 0.9 % bolus 500 mL (500 mLs Intravenous New Bag/Given 11/25/16 1141)     Initial Impression / Assessment and Plan / ED Course  I have reviewed the triage vital signs and the nursing notes.  Pertinent labs & imaging results that were available during my care of the patient were reviewed by me and considered in my medical decision making (see chart for details).     BP 127/62   Pulse 90   Temp 97.6 F (36.4 C) (Oral)   Resp (!) 23   Ht 5\' 4"  (1.626 m)   Wt 67.1 kg   SpO2 100%   BMI 25.40 kg/m    Final Clinical Impressions(s) / ED Diagnoses   Final diagnoses:  Dehydration    New Prescriptions New  Prescriptions   No medications on file   10:18 AM Patient complaining of dizziness however it appears more lightheadedness. No room spinning sensation. This is likely secondary to decrease in appetite from a recent systemic infection. She was prescribed Cipro and Flagyl last Saturday but was told by her PCP to discontinue the medication.  12:37 PM Labs with low K+ at 2.8, otherwise labs are at her baseline.  Pt receive gentle IV hydration.  Elevated heart rate on orthostatic vital sign but no hypotension. Suspect dehydration causing weakness.  Pt haven't eating/drinking much.  Care discussed with DR. Long.    Domenic Moras, PA-C 11/25/16 [place], MD 11/26/16 [phone]

## 2016-11-25 NOTE — ED Notes (Signed)
Patient stated that she was dizzy upon sitting and standing.  Dizziness does not subside any

## 2016-11-27 DIAGNOSIS — D631 Anemia in chronic kidney disease: Secondary | ICD-10-CM | POA: Diagnosis not present

## 2016-11-27 DIAGNOSIS — Z992 Dependence on renal dialysis: Secondary | ICD-10-CM | POA: Diagnosis not present

## 2016-11-27 DIAGNOSIS — R112 Nausea with vomiting, unspecified: Secondary | ICD-10-CM | POA: Diagnosis not present

## 2016-11-27 DIAGNOSIS — D509 Iron deficiency anemia, unspecified: Secondary | ICD-10-CM | POA: Diagnosis not present

## 2016-11-27 DIAGNOSIS — N186 End stage renal disease: Secondary | ICD-10-CM | POA: Diagnosis not present

## 2016-11-27 DIAGNOSIS — N2581 Secondary hyperparathyroidism of renal origin: Secondary | ICD-10-CM | POA: Diagnosis not present

## 2016-11-29 DIAGNOSIS — N186 End stage renal disease: Secondary | ICD-10-CM | POA: Diagnosis not present

## 2016-11-29 DIAGNOSIS — Z992 Dependence on renal dialysis: Secondary | ICD-10-CM | POA: Diagnosis not present

## 2016-11-30 DIAGNOSIS — R112 Nausea with vomiting, unspecified: Secondary | ICD-10-CM | POA: Diagnosis not present

## 2016-11-30 DIAGNOSIS — N186 End stage renal disease: Secondary | ICD-10-CM | POA: Diagnosis not present

## 2016-11-30 DIAGNOSIS — Z992 Dependence on renal dialysis: Secondary | ICD-10-CM | POA: Diagnosis not present

## 2016-11-30 DIAGNOSIS — N2581 Secondary hyperparathyroidism of renal origin: Secondary | ICD-10-CM | POA: Diagnosis not present

## 2016-11-30 DIAGNOSIS — D631 Anemia in chronic kidney disease: Secondary | ICD-10-CM | POA: Diagnosis not present

## 2016-11-30 DIAGNOSIS — D509 Iron deficiency anemia, unspecified: Secondary | ICD-10-CM | POA: Diagnosis not present

## 2016-12-02 DIAGNOSIS — D509 Iron deficiency anemia, unspecified: Secondary | ICD-10-CM | POA: Diagnosis not present

## 2016-12-02 DIAGNOSIS — Z992 Dependence on renal dialysis: Secondary | ICD-10-CM | POA: Diagnosis not present

## 2016-12-02 DIAGNOSIS — N186 End stage renal disease: Secondary | ICD-10-CM | POA: Diagnosis not present

## 2016-12-02 DIAGNOSIS — D631 Anemia in chronic kidney disease: Secondary | ICD-10-CM | POA: Diagnosis not present

## 2016-12-02 DIAGNOSIS — R112 Nausea with vomiting, unspecified: Secondary | ICD-10-CM | POA: Diagnosis not present

## 2016-12-02 DIAGNOSIS — N2581 Secondary hyperparathyroidism of renal origin: Secondary | ICD-10-CM | POA: Diagnosis not present

## 2016-12-03 DIAGNOSIS — R509 Fever, unspecified: Secondary | ICD-10-CM | POA: Diagnosis not present

## 2016-12-03 DIAGNOSIS — E063 Autoimmune thyroiditis: Secondary | ICD-10-CM | POA: Diagnosis not present

## 2016-12-03 DIAGNOSIS — N185 Chronic kidney disease, stage 5: Secondary | ICD-10-CM | POA: Diagnosis not present

## 2016-12-03 DIAGNOSIS — Z6822 Body mass index (BMI) 22.0-22.9, adult: Secondary | ICD-10-CM | POA: Diagnosis not present

## 2016-12-03 DIAGNOSIS — I10 Essential (primary) hypertension: Secondary | ICD-10-CM | POA: Diagnosis not present

## 2016-12-04 DIAGNOSIS — R112 Nausea with vomiting, unspecified: Secondary | ICD-10-CM | POA: Diagnosis not present

## 2016-12-04 DIAGNOSIS — Z992 Dependence on renal dialysis: Secondary | ICD-10-CM | POA: Diagnosis not present

## 2016-12-04 DIAGNOSIS — N186 End stage renal disease: Secondary | ICD-10-CM | POA: Diagnosis not present

## 2016-12-04 DIAGNOSIS — D509 Iron deficiency anemia, unspecified: Secondary | ICD-10-CM | POA: Diagnosis not present

## 2016-12-04 DIAGNOSIS — D631 Anemia in chronic kidney disease: Secondary | ICD-10-CM | POA: Diagnosis not present

## 2016-12-04 DIAGNOSIS — N2581 Secondary hyperparathyroidism of renal origin: Secondary | ICD-10-CM | POA: Diagnosis not present

## 2016-12-07 ENCOUNTER — Emergency Department (HOSPITAL_COMMUNITY)
Admission: EM | Admit: 2016-12-07 | Discharge: 2016-12-07 | Disposition: A | Discharge status: Home / Self Care | Payer: Medicare Other | Attending: Emergency Medicine | Admitting: Emergency Medicine

## 2016-12-07 ENCOUNTER — Encounter (HOSPITAL_COMMUNITY): Payer: Self-pay | Admitting: Emergency Medicine

## 2016-12-07 DIAGNOSIS — I6789 Other cerebrovascular disease: Secondary | ICD-10-CM | POA: Diagnosis not present

## 2016-12-07 DIAGNOSIS — F419 Anxiety disorder, unspecified: Secondary | ICD-10-CM | POA: Insufficient documentation

## 2016-12-07 DIAGNOSIS — I120 Hypertensive chronic kidney disease with stage 5 chronic kidney disease or end stage renal disease: Secondary | ICD-10-CM | POA: Diagnosis not present

## 2016-12-07 DIAGNOSIS — Z79899 Other long term (current) drug therapy: Secondary | ICD-10-CM | POA: Insufficient documentation

## 2016-12-07 DIAGNOSIS — E039 Hypothyroidism, unspecified: Secondary | ICD-10-CM | POA: Diagnosis not present

## 2016-12-07 DIAGNOSIS — Z8542 Personal history of malignant neoplasm of other parts of uterus: Secondary | ICD-10-CM | POA: Insufficient documentation

## 2016-12-07 DIAGNOSIS — Z992 Dependence on renal dialysis: Secondary | ICD-10-CM | POA: Insufficient documentation

## 2016-12-07 DIAGNOSIS — N186 End stage renal disease: Secondary | ICD-10-CM | POA: Insufficient documentation

## 2016-12-07 DIAGNOSIS — M542 Cervicalgia: Secondary | ICD-10-CM | POA: Diagnosis not present

## 2016-12-07 DIAGNOSIS — D509 Iron deficiency anemia, unspecified: Secondary | ICD-10-CM | POA: Diagnosis not present

## 2016-12-07 DIAGNOSIS — R42 Dizziness and giddiness: Secondary | ICD-10-CM | POA: Diagnosis present

## 2016-12-07 DIAGNOSIS — R112 Nausea with vomiting, unspecified: Secondary | ICD-10-CM | POA: Diagnosis not present

## 2016-12-07 DIAGNOSIS — N2581 Secondary hyperparathyroidism of renal origin: Secondary | ICD-10-CM | POA: Diagnosis not present

## 2016-12-07 DIAGNOSIS — D631 Anemia in chronic kidney disease: Secondary | ICD-10-CM | POA: Diagnosis not present

## 2016-12-07 DIAGNOSIS — R200 Anesthesia of skin: Secondary | ICD-10-CM | POA: Diagnosis not present

## 2016-12-07 NOTE — Discharge Instructions (Signed)
Return if concern for any reason or see Dr. Gerarda Fraction. Go to dialysis on Thursday, 12/09/2016 as you normally would.

## 2016-12-07 NOTE — ED Notes (Signed)
A&O x 4. Family at bedside

## 2016-12-07 NOTE — ED Triage Notes (Signed)
Per EMS: Pt picked up from [facility] in [place] today, called out for anxiety, tingling in hands, and pain in neck.  Pt states her husband has been sick recently.  Pt was placed on 02 at dialysis, none at this time. Pt ran through whole treatment today and was given xanax with relief by dialysis center. Pt alert and oriented. 130/73, 97hr, 119 cbg

## 2016-12-07 NOTE — ED Provider Notes (Signed)
[facility] DEPT Provider Note   CSN: [id number] Arrival date & time: 12/07/16  1123     History   Chief Complaint Chief Complaint  Patient presents with  . Anxiety    HPI Sonya [name] is a 66 y.o. female. Patient reports she had a panic attack while at her hemodialysis session earlier this morning. She felt lightheaded and tingling about her body. She's had similar panic attacks before. She is concerned about her husband who is currently ill. She feels back to baseline after treatment with Xanax prior to arrival. She denies any chest pain and shortness of breath no other associated symptoms. Nothing made symptoms better or worse. HPI  Past Medical History:  Diagnosis Date  . Anemia   . CKD (chronic kidney disease)   . Diverticulitis   . Dysphagia   . ESRD (end stage renal disease) ([place])    On HD  . Fracture of femoral neck, left, closed ([place]) 05/30/2015  . Gastritis 06/2013 & 05/2015  . Gastroesophageal reflux disease with stricture 05/2015  . GERD (gastroesophageal reflux disease)   . HTN (hypertension)   . Hyperparathyroidism due to renal insufficiency ([place])   . Hypertensive urgency 06/2013 & 04/2015  . Hypothyroidism   . ICH (intracerebral hemorrhage) ([place]) 10/2014  . 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 Protein calorie malnutrition ([place])   . Stroke ([place])   . Uterine cancer ([place]) 2012  . Vertical diplopia   . Vertigo     Patient Active Problem List   Diagnosis Date Noted  . 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 Colitis, acute 11/20/2016  . ESRD (end stage renal disease) ([place])   . Weakness   . Abnormal weight loss 12/01/2015  . Stomach growling 12/01/2015  . Pulmonary edema 08/19/2015  . CAP (community acquired pneumonia) 08/19/2015  . Hypothyroidism, adult 06/14/2015  . HA (headache) 06/13/2015  . HTN (hypertension), malignant 06/13/2015  . Elective surgery   . Fracture of femoral neck, left, closed ([place]) 05/30/2015  . Hip fracture ([place]) 05/30/2015  . Closed  fracture of neck of left femur ([place])   . Loss of weight   . Emesis   . Nausea with vomiting 05/13/2015  . Nausea and vomiting 04/14/2015  . UTI (lower urinary tract infection) 04/14/2015  . Orthostatic hypotension 11/27/2014  . Accelerated hypertension 11/25/2014  . Dizziness   . Uncontrolled hypertension   . Acute encephalopathy   . Confusion   . Protein-calorie malnutrition, severe ([place]) 10/15/2014  . Dysphagia 10/15/2014  . Hypertensive emergency 10/14/2014  . Hypertensive urgency, malignant 10/14/2014  . Intracerebral bleed ([place]) 10/14/2014  . End stage renal disease ([place]) 08/31/2013  . Symptomatic anemia 08/18/2013  . Hyperparathyroidism due to renal insufficiency ([place]) 07/09/2013  . Unspecified constipation 06/21/2013  . Gastritis 06/21/2013  . Anemia 06/20/2013  . Dizzy 06/20/2013  . Chronic kidney disease 06/20/2013  . Anemia in chronic kidney disease 06/08/2013  . Regurgitation 06/08/2013  . Hypokalemia 06/08/2013  . Gout 06/08/2013  . Other dysphagia 06/07/2013  . Hypertensive urgency 06/07/2013  . Borborygmi 04/08/2011  . Essential hypertension 04/08/2011  . Colon cancer screening 04/08/2011    Past Surgical History:  Procedure Laterality Date  . ABDOMINAL HYSTERECTOMY    . APPENDECTOMY    . BASCILIC VEIN TRANSPOSITION Right 09/17/2013   Procedure: BRACHIAL VEIN TRANSPOSITION;  Surgeon: Conrad [place], MD;  Location: [facility];  Service: Vascular;  Laterality: Right;  . BASCILIC VEIN TRANSPOSITION Right 10/29/2013   Procedure: RIGHT SECOND STAGE BRACHIAL VEIN TRANSPOSITION;  Surgeon: Conrad Egypt, MD;  Location: [facility];  Service: Vascular;  Laterality: Right;  . CESAREAN SECTION    . CHOLECYSTECTOMY    . COLONOSCOPY  2000   TICS, IH  . COLONOSCOPY  2003 NUR BRBPR D50 V6   DC/[place] TICS, IH  . COLONOSCOPY N/A 09/04/2013   SLF: Small ulcer in the descending colon, one colon polyp (tubular adenoma), large internal hemorrhoids, moderate diverticulosis. next tcs 10 years.    . ESOPHAGOGASTRODUODENOSCOPY N/A 05/23/2015   POE:UMPN non-erosive gastritis/stricture at the gastroesophageal junction  . ESOPHAGOGASTRODUODENOSCOPY (EGD) WITH ESOPHAGEAL DILATION N/A 06/08/2013   Dr. Fields:Stricture at the gastroesophageal junction/MILD NON-erosive gastritis (inflammation) was found in the gastric antrum; multiple biopsies/NO SOURCE FOR ANEMIA IDETIFIED-MOST LIKELY DUE TO ANEMIA OF CHRONIC DISEASE  . FLEXIBLE SIGMOIDOSCOPY N/A 01/02/2016   Dr. Oneida Alar: external hemorrhoids, diverticulosis, internal hemorrhoids, proximal rectum normal  . GIVENS CAPSULE STUDY N/A 12/03/2013   SLF: occasional gastric erosion. small bowel normal  . HIP PINNING,CANNULATED Left 05/31/2015   Procedure: CANNULATED HIP PINNING;  Surgeon: Leandrew Koyanagi, MD;  Location: [facility];  Service: Orthopedics;  Laterality: Left;  . INSERTION OF DIALYSIS CATHETER Left 09/17/2013   Procedure: INSERTION OF DIALYSIS CATHETER;  Surgeon: Conrad [place], MD;  Location: [facility];  Service: Vascular;  Laterality: Left;  . LAPAROSCOPIC TOTAL HYSTERECTOMY    . NASAL SEPTUM SURGERY    . SAVORY DILATION N/A 05/23/2015   Procedure: SAVORY DILATION;  Surgeon: Danie Binder, MD;  [facility];  Service: Endoscopy;  Laterality: N/A;    OB History    Gravida Para Term Preterm AB Living   1 1 1          SAB TAB Ectopic Multiple Live Births                   Home Medications    Prior to Admission medications   Medication Sig Start Date End Date Taking? Authorizing Provider  alendronate (FOSAMAX) 70 MG tablet Take 70 mg by mouth once a week. Take with a full glass of water on an empty stomach.   Yes [provider]  allopurinol (ZYLOPRIM) 300 MG tablet Take 0.5 tablets (150 mg total) by mouth daily. Dose was decreased secondary to renal failure. Patient taking differently: Take 300 mg by mouth daily. Dose was decreased secondary to renal failure. 06/16/15  Yes Rexene Alberts, MD  doxycycline (VIBRAMYCIN) 100 MG  capsule Take 1 capsule by mouth 2 (two) times daily. 12/03/16  Yes [provider]  levothyroxine (SYNTHROID, LEVOTHROID) 50 MCG tablet Take 1 tablet (50 mcg total) by mouth daily. 04/17/15  Yes Samuella Cota, MD  lidocaine-prilocaine (EMLA) cream Apply 1 application topically every Tuesday, Thursday, and Saturday at 6 PM.    Yes [provider]  meclizine (ANTIVERT) 25 MG tablet Take 1 tablet by mouth every 6 (six) hours as needed for dizziness. dizziness 11/22/14  Yes [provider]  metoCLOPramide (REGLAN) 5 MG tablet 1 po before breakfast. 01/02/16  Yes Fields, Sandi L, MD  NIFEdipine (PROCARDIA XL/ADALAT-CC) 90 MG 24 hr tablet Take 90 mg by mouth daily. 04/10/15  Yes [provider]  omeprazole (PRILOSEC) 20 MG capsule TAKE ONE CAPSULE BY MOUTH ONCE DAILY 05/18/16  Yes Annitta Needs, NP  sevelamer carbonate (RENVELA) 800 MG tablet Take 2,400 mg by mouth 3 (three) times daily with meals.    Yes [provider]  ciprofloxacin (CIPRO) 500 MG tablet Take 1 tablet (500  mg total) by mouth daily. Patient not taking: Reported on 12/07/2016 11/21/16   Kathie Dike, MD  metroNIDAZOLE (FLAGYL) 500 MG tablet Take 1 tablet (500 mg total) by mouth every 8 (eight) hours. Patient not taking: Reported on 12/07/2016 11/20/16   Kathie Dike, MD    Family History Family History  Problem Relation Age of Onset  . Cancer Mother   . Diabetes Mother   . Hypertension Mother   . Diabetes Father   . Hypertension Father   . Hypertension Sister   . Colon cancer Neg Hx     Social History Social History  Substance Use Topics  . Smoking status: Never Smoker  . Smokeless tobacco: Never Used  . Alcohol use No     Allergies   Cephalexin   Review of Systems Review of Systems  Constitutional: Negative.   HENT: Negative.   Respiratory: Negative.   Cardiovascular: Negative.   Gastrointestinal: Negative.   Musculoskeletal: Negative.   Skin: Negative.     Allergic/Immunologic: Positive for immunocompromised state.       Hemodialysis patient  Neurological: Negative.   Psychiatric/Behavioral: The patient is nervous/anxious.   All other systems reviewed and are negative.    Physical Exam Updated Vital Signs BP 134/71   Pulse 91   Temp 98.5 F (36.9 C) (Oral)   Resp 15   Ht 5\' 7"  (1.702 m)   Wt 150 lb (68 kg)   SpO2 100%   BMI 23.49 kg/m   Physical Exam  Constitutional: She is oriented to person, place, and time. She appears well-developed and well-nourished.  HENT:  Head: Normocephalic and atraumatic.  Eyes: Conjunctivae are normal. Pupils are equal, round, and reactive to light.  Neck: Neck supple. No tracheal deviation present. No thyromegaly present.  Cardiovascular: Normal rate and regular rhythm.   No murmur heard. Pulmonary/Chest: Effort normal and breath sounds normal.  Abdominal: Soft. Bowel sounds are normal. She exhibits no distension. There is no tenderness.  Musculoskeletal: Normal range of motion. She exhibits no edema or tenderness.  Dialysis fistula at right upper extremity with good thrill  Neurological: She is alert and oriented to person, place, and time. Coordination normal.  Skin: Skin is warm and dry. No rash noted.  Psychiatric: She has a normal mood and affect.  Nursing note and vitals reviewed.    ED Treatments / Results  Labs (all labs ordered are listed, but only abnormal results are displayed) Labs Reviewed - No data to display  EKG  EKG Interpretation  Date/Time:  Tuesday Dec 07 2016 11:44:52 EDT Ventricular Rate:  93 PR Interval:    QRS Duration: 85 QT Interval:  467 QTC Calculation: 581 R Axis:   57 Text Interpretation:  Sinus rhythm Biatrial enlargement Probable left ventricular hypertrophy Prolonged QT interval No significant change since last tracing Confirmed by Winfred Leeds  MD, Ashantae Pangallo ([id number]) on 12/07/2016 11:59:13 AM       Radiology No results found.  Procedures Procedures  (including critical care time)  Medications Ordered in ED Medications - No data to display   Initial Impression / Assessment and Plan / ED Course  I have reviewed the triage vital signs and the nursing notes.  Pertinent labs & imaging results that were available during my care of the patient were reviewed by me and considered in my medical decision making (see chart for details).     Patient refused blood draw or further diagnostic workup. She states "I know what happened." I've instructed her to keep her  scheduled appointment with her next dialysis session in 2 days. Return if condition worsen or see her PCP Dr.Fusco  Final Clinical Impressions(s) / ED Diagnoses   Final diagnoses:  Anxiety    New Prescriptions New Prescriptions   No medications on file     Orlie Dakin, MD 12/07/16 1320

## 2016-12-09 DIAGNOSIS — R112 Nausea with vomiting, unspecified: Secondary | ICD-10-CM | POA: Diagnosis not present

## 2016-12-09 DIAGNOSIS — N2581 Secondary hyperparathyroidism of renal origin: Secondary | ICD-10-CM | POA: Diagnosis not present

## 2016-12-09 DIAGNOSIS — N186 End stage renal disease: Secondary | ICD-10-CM | POA: Diagnosis not present

## 2016-12-09 DIAGNOSIS — D631 Anemia in chronic kidney disease: Secondary | ICD-10-CM | POA: Diagnosis not present

## 2016-12-09 DIAGNOSIS — D509 Iron deficiency anemia, unspecified: Secondary | ICD-10-CM | POA: Diagnosis not present

## 2016-12-09 DIAGNOSIS — Z992 Dependence on renal dialysis: Secondary | ICD-10-CM | POA: Diagnosis not present

## 2016-12-11 DIAGNOSIS — N2581 Secondary hyperparathyroidism of renal origin: Secondary | ICD-10-CM | POA: Diagnosis not present

## 2016-12-11 DIAGNOSIS — N186 End stage renal disease: Secondary | ICD-10-CM | POA: Diagnosis not present

## 2016-12-11 DIAGNOSIS — D509 Iron deficiency anemia, unspecified: Secondary | ICD-10-CM | POA: Diagnosis not present

## 2016-12-11 DIAGNOSIS — Z992 Dependence on renal dialysis: Secondary | ICD-10-CM | POA: Diagnosis not present

## 2016-12-11 DIAGNOSIS — R112 Nausea with vomiting, unspecified: Secondary | ICD-10-CM | POA: Diagnosis not present

## 2016-12-11 DIAGNOSIS — D631 Anemia in chronic kidney disease: Secondary | ICD-10-CM | POA: Diagnosis not present

## 2016-12-13 DIAGNOSIS — N2581 Secondary hyperparathyroidism of renal origin: Secondary | ICD-10-CM | POA: Diagnosis not present

## 2016-12-13 DIAGNOSIS — Z992 Dependence on renal dialysis: Secondary | ICD-10-CM | POA: Diagnosis not present

## 2016-12-13 DIAGNOSIS — D509 Iron deficiency anemia, unspecified: Secondary | ICD-10-CM | POA: Diagnosis not present

## 2016-12-13 DIAGNOSIS — R112 Nausea with vomiting, unspecified: Secondary | ICD-10-CM | POA: Diagnosis not present

## 2016-12-13 DIAGNOSIS — N186 End stage renal disease: Secondary | ICD-10-CM | POA: Diagnosis not present

## 2016-12-13 DIAGNOSIS — D631 Anemia in chronic kidney disease: Secondary | ICD-10-CM | POA: Diagnosis not present

## 2016-12-14 DIAGNOSIS — Z992 Dependence on renal dialysis: Secondary | ICD-10-CM | POA: Diagnosis not present

## 2016-12-14 DIAGNOSIS — D631 Anemia in chronic kidney disease: Secondary | ICD-10-CM | POA: Diagnosis not present

## 2016-12-14 DIAGNOSIS — N2581 Secondary hyperparathyroidism of renal origin: Secondary | ICD-10-CM | POA: Diagnosis not present

## 2016-12-14 DIAGNOSIS — R112 Nausea with vomiting, unspecified: Secondary | ICD-10-CM | POA: Diagnosis not present

## 2016-12-14 DIAGNOSIS — N186 End stage renal disease: Secondary | ICD-10-CM | POA: Diagnosis not present

## 2016-12-14 DIAGNOSIS — D509 Iron deficiency anemia, unspecified: Secondary | ICD-10-CM | POA: Diagnosis not present

## 2016-12-15 DIAGNOSIS — Z6821 Body mass index (BMI) 21.0-21.9, adult: Secondary | ICD-10-CM | POA: Diagnosis not present

## 2016-12-15 DIAGNOSIS — R42 Dizziness and giddiness: Secondary | ICD-10-CM | POA: Diagnosis not present

## 2016-12-15 DIAGNOSIS — R5383 Other fatigue: Secondary | ICD-10-CM | POA: Diagnosis not present

## 2016-12-15 DIAGNOSIS — R634 Abnormal weight loss: Secondary | ICD-10-CM | POA: Diagnosis not present

## 2016-12-16 DIAGNOSIS — N186 End stage renal disease: Secondary | ICD-10-CM | POA: Diagnosis not present

## 2016-12-16 DIAGNOSIS — Z992 Dependence on renal dialysis: Secondary | ICD-10-CM | POA: Diagnosis not present

## 2016-12-16 DIAGNOSIS — D631 Anemia in chronic kidney disease: Secondary | ICD-10-CM | POA: Diagnosis not present

## 2016-12-16 DIAGNOSIS — D509 Iron deficiency anemia, unspecified: Secondary | ICD-10-CM | POA: Diagnosis not present

## 2016-12-16 DIAGNOSIS — N2581 Secondary hyperparathyroidism of renal origin: Secondary | ICD-10-CM | POA: Diagnosis not present

## 2016-12-16 DIAGNOSIS — R112 Nausea with vomiting, unspecified: Secondary | ICD-10-CM | POA: Diagnosis not present

## 2016-12-17 ENCOUNTER — Telehealth: Payer: Self-pay

## 2016-12-17 ENCOUNTER — Emergency Department (HOSPITAL_COMMUNITY)
Admission: EM | Admit: 2016-12-17 | Discharge: 2016-12-17 | Disposition: A | Discharge status: Home / Self Care | Payer: Medicare Other | Attending: Emergency Medicine | Admitting: Emergency Medicine

## 2016-12-17 ENCOUNTER — Encounter (HOSPITAL_COMMUNITY): Payer: Self-pay

## 2016-12-17 DIAGNOSIS — Z992 Dependence on renal dialysis: Secondary | ICD-10-CM | POA: Insufficient documentation

## 2016-12-17 DIAGNOSIS — E039 Hypothyroidism, unspecified: Secondary | ICD-10-CM | POA: Diagnosis not present

## 2016-12-17 DIAGNOSIS — R531 Weakness: Secondary | ICD-10-CM

## 2016-12-17 DIAGNOSIS — N186 End stage renal disease: Secondary | ICD-10-CM | POA: Insufficient documentation

## 2016-12-17 DIAGNOSIS — Z79899 Other long term (current) drug therapy: Secondary | ICD-10-CM | POA: Diagnosis not present

## 2016-12-17 DIAGNOSIS — Z8542 Personal history of malignant neoplasm of other parts of uterus: Secondary | ICD-10-CM | POA: Diagnosis not present

## 2016-12-17 DIAGNOSIS — R634 Abnormal weight loss: Secondary | ICD-10-CM | POA: Insufficient documentation

## 2016-12-17 DIAGNOSIS — I120 Hypertensive chronic kidney disease with stage 5 chronic kidney disease or end stage renal disease: Secondary | ICD-10-CM | POA: Insufficient documentation

## 2016-12-17 LAB — COMPREHENSIVE METABOLIC PANEL
ALK PHOS: 55 U/L (ref 38–126)
ALT: 17 U/L (ref 14–54)
ANION GAP: 12 (ref 5–15)
AST: 21 U/L (ref 15–41)
Albumin: 3.8 g/dL (ref 3.5–5.0)
BILIRUBIN TOTAL: 0.5 mg/dL (ref 0.3–1.2)
BUN: 12 mg/dL (ref 6–20)
CALCIUM: 9.5 mg/dL (ref 8.9–10.3)
CO2: 27 mmol/L (ref 22–32)
Chloride: 98 mmol/L — ABNORMAL LOW (ref 101–111)
Creatinine, Ser: 5.29 mg/dL — ABNORMAL HIGH (ref 0.44–1.00)
GFR calc non Af Amer: 8 mL/min — ABNORMAL LOW (ref >60)
GFR, EST AFRICAN AMERICAN: 9 mL/min — AB (ref >60)
Glucose, Bld: 88 mg/dL (ref 65–99)
POTASSIUM: 3.1 mmol/L — AB (ref 3.5–5.1)
Sodium: 137 mmol/L (ref 135–145)
TOTAL PROTEIN: 8.2 g/dL — AB (ref 6.5–8.1)

## 2016-12-17 LAB — CBC WITH DIFFERENTIAL/PLATELET
BASOS ABS: 0 10*3/uL (ref 0.0–0.1)
BASOS PCT: 1 %
Eosinophils Absolute: 0.1 10*3/uL (ref 0.0–0.7)
Eosinophils Relative: 1 %
HEMATOCRIT: 42.6 % (ref 36.0–46.0)
HEMOGLOBIN: 13.5 g/dL (ref 12.0–15.0)
Lymphocytes Relative: 42 %
Lymphs Abs: 2.3 10*3/uL (ref 0.7–4.0)
MCH: 25.5 pg — ABNORMAL LOW (ref 26.0–34.0)
MCHC: 31.7 g/dL (ref 30.0–36.0)
MCV: 80.5 fL (ref 78.0–100.0)
Monocytes Absolute: 0.6 10*3/uL (ref 0.1–1.0)
Monocytes Relative: 10 %
NEUTROS ABS: 2.5 10*3/uL (ref 1.7–7.7)
NEUTROS PCT: 46 %
Platelets: 216 10*3/uL (ref 150–400)
RBC: 5.29 MIL/uL — AB (ref 3.87–5.11)
RDW: 19 % — AB (ref 11.5–15.5)
WBC: 5.4 10*3/uL (ref 4.0–10.5)

## 2016-12-17 MED ORDER — SODIUM CHLORIDE 0.9 % IV BOLUS (SEPSIS)
500.0000 mL | Freq: Once | INTRAVENOUS | Status: AC
  Administered 2016-12-17: 500 mL via INTRAVENOUS

## 2016-12-17 NOTE — ED Provider Notes (Signed)
[facility] DEPT Provider Note   CSN: [id number] Arrival date & time: 12/17/16  0535  Time seen 05:55 AM   History   Chief Complaint Chief Complaint  Patient presents with  . Weakness    HPI Sonya [name] is a 66 y.o. female.  HPI patient has end-stage renal disease and states she goes to dialysis on Tuesday, Thursday (yesterday) and Saturdays. She states Tuesday she started feeling weak all the time. She states she's weak whether she sits or stands. She also has been having a loss of appetite for a long time and has been losing weight over the past month. She states she's lost from 140 down to 120 pounds. She states she "can't eat". When asked what that means she states that she has no appetite. She denies having any pain. She has not had nausea until this past week. She states she was admitted on 11/19/2016 and she had a CT scan of the abdomen which did not show any abnormality to account for her weight loss or loss of appetite. She denies fever, cough, abdominal pain, or diarrhea. She's denies dizziness such as spinning but states she's dizzy meaning she feels like she's going to pass out all the time. She states she's been taking Valium and meclizine for this dizziness for a long time. We then discussed it and she then states that dizziness and her weakness are the same thing. She uses a cane because when she walks she feels wobbly. She states she makes minimal urine during the day. She states during her dialysis they do not take fluid off of her they just "clean my kidneys". She went to her PCP 2 days ago and they told her she needed to come to the ED to be evaluated.  PCP Redmond School, MD   Past Medical History:  Diagnosis Date  . Anemia   . CKD (chronic kidney disease)   . Diverticulitis   . Dysphagia   . ESRD (end stage renal disease) ([place])    On HD  . Fracture of femoral neck, left, closed ([place]) 05/30/2015  . Gastritis 06/2013 & 05/2015  . Gastroesophageal reflux  disease with stricture 05/2015  . GERD (gastroesophageal reflux disease)   . HTN (hypertension)   . Hyperparathyroidism due to renal insufficiency ([place])   . Hypertensive urgency 06/2013 & 04/2015  . Hypothyroidism   . ICH (intracerebral hemorrhage) ([place]) 10/2014  . 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 Protein calorie malnutrition ([place])   . Stroke ([place])   . Uterine cancer ([place]) 2012  . Vertical diplopia   . Vertigo     Patient Active Problem List   Diagnosis Date Noted  . 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 Colitis, acute 11/20/2016  . ESRD (end stage renal disease) ([place])   . Weakness   . Abnormal weight loss 12/01/2015  . Stomach growling 12/01/2015  . Pulmonary edema 08/19/2015  . CAP (community acquired pneumonia) 08/19/2015  . Hypothyroidism, adult 06/14/2015  . HA (headache) 06/13/2015  . HTN (hypertension), malignant 06/13/2015  . Elective surgery   . Fracture of femoral neck, left, closed ([place]) 05/30/2015  . Hip fracture ([place]) 05/30/2015  . Closed fracture of neck of left femur ([place])   . Loss of weight   . Emesis   . Nausea with vomiting 05/13/2015  . Nausea and vomiting 04/14/2015  . UTI (lower urinary tract infection) 04/14/2015  . Orthostatic hypotension 11/27/2014  . Accelerated hypertension 11/25/2014  . Dizziness   . Uncontrolled hypertension   .  Acute encephalopathy   . Confusion   . Protein-calorie malnutrition, severe ([place]) 10/15/2014  . Dysphagia 10/15/2014  . Hypertensive emergency 10/14/2014  . Hypertensive urgency, malignant 10/14/2014  . Intracerebral bleed ([place]) 10/14/2014  . End stage renal disease ([place]) 08/31/2013  . Symptomatic anemia 08/18/2013  . Hyperparathyroidism due to renal insufficiency ([place]) 07/09/2013  . Unspecified constipation 06/21/2013  . Gastritis 06/21/2013  . Anemia 06/20/2013  . Dizzy 06/20/2013  . Chronic kidney disease 06/20/2013  . Anemia in chronic kidney disease 06/08/2013  . Regurgitation 06/08/2013  . Hypokalemia  06/08/2013  . Gout 06/08/2013  . Other dysphagia 06/07/2013  . Hypertensive urgency 06/07/2013  . Borborygmi 04/08/2011  . Essential hypertension 04/08/2011  . Colon cancer screening 04/08/2011    Past Surgical History:  Procedure Laterality Date  . ABDOMINAL HYSTERECTOMY    . APPENDECTOMY    . BASCILIC VEIN TRANSPOSITION Right 09/17/2013   Procedure: BRACHIAL VEIN TRANSPOSITION;  Surgeon: Conrad Stanly, MD;  Location: [facility];  Service: Vascular;  Laterality: Right;  . BASCILIC VEIN TRANSPOSITION Right 10/29/2013   Procedure: RIGHT SECOND STAGE BRACHIAL VEIN TRANSPOSITION;  Surgeon: Conrad Leslie, MD;  Location: [facility];  Service: Vascular;  Laterality: Right;  . CESAREAN SECTION    . CHOLECYSTECTOMY    . COLONOSCOPY  2000   TICS, IH  . COLONOSCOPY  2003 NUR BRBPR D50 V6   DC/ TICS, IH  . COLONOSCOPY N/A 09/04/2013   SLF: Small ulcer in the descending colon, one colon polyp (tubular adenoma), large internal hemorrhoids, moderate diverticulosis. next tcs 10 years.  . ESOPHAGOGASTRODUODENOSCOPY N/A 05/23/2015   KAJ:GOTL non-erosive gastritis/stricture at the gastroesophageal junction  . ESOPHAGOGASTRODUODENOSCOPY (EGD) WITH ESOPHAGEAL DILATION N/A 06/08/2013   Dr. Fields:Stricture at the gastroesophageal junction/MILD NON-erosive gastritis (inflammation) was found in the gastric antrum; multiple biopsies/NO SOURCE FOR ANEMIA IDETIFIED-MOST LIKELY DUE TO ANEMIA OF CHRONIC DISEASE  . FLEXIBLE SIGMOIDOSCOPY N/A 01/02/2016   Dr. Oneida Alar: external hemorrhoids, diverticulosis, internal hemorrhoids, proximal rectum normal  . GIVENS CAPSULE STUDY N/A 12/03/2013   SLF: occasional gastric erosion. small bowel normal  . HIP PINNING,CANNULATED Left 05/31/2015   Procedure: CANNULATED HIP PINNING;  Surgeon: Leandrew Koyanagi, MD;  Location: [facility];  Service: Orthopedics;  Laterality: Left;  . INSERTION OF DIALYSIS CATHETER Left 09/17/2013   Procedure: INSERTION OF DIALYSIS CATHETER;  Surgeon: Conrad Barnhill, MD;   Location: [facility];  Service: Vascular;  Laterality: Left;  . LAPAROSCOPIC TOTAL HYSTERECTOMY    . NASAL SEPTUM SURGERY    . SAVORY DILATION N/A 05/23/2015   Procedure: SAVORY DILATION;  Surgeon: Danie Binder, MD;  [facility];  Service: Endoscopy;  Laterality: N/A;    OB History    Gravida Para Term Preterm AB Living   1 1 1          SAB TAB Ectopic Multiple Live Births                   Home Medications    Prior to Admission medications   Medication Sig Start Date End Date Taking? Authorizing Provider  alendronate (FOSAMAX) 70 MG tablet Take 70 mg by mouth once a week. Take with a full glass of water on an empty stomach.    [provider]  allopurinol (ZYLOPRIM) 300 MG tablet Take 0.5 tablets (150 mg total) by mouth daily. Dose was decreased secondary to renal failure. Patient taking differently: Take 300 mg by mouth daily. Dose was decreased secondary to renal failure. 06/16/15  Rexene Alberts, MD  ciprofloxacin (CIPRO) 500 MG tablet Take 1 tablet (500 mg total) by mouth daily. Patient not taking: Reported on 12/07/2016 11/21/16   Kathie Dike, MD  doxycycline (VIBRAMYCIN) 100 MG capsule Take 1 capsule by mouth 2 (two) times daily. 12/03/16   [provider]  labetalol (NORMODYNE) 200 MG tablet Take 400 mg by mouth 2 (two) times daily.     [provider]  levothyroxine (SYNTHROID, LEVOTHROID) 50 MCG tablet Take 1 tablet (50 mcg total) by mouth daily. 04/17/15   Samuella Cota, MD  lidocaine-prilocaine (EMLA) cream Apply 1 application topically every Tuesday, Thursday, and Saturday at 6 PM.     [provider]  meclizine (ANTIVERT) 25 MG tablet Take 1 tablet by mouth every 6 (six) hours as needed for dizziness. dizziness 11/22/14   [provider]  metoCLOPramide (REGLAN) 5 MG tablet 1 po before breakfast. 01/02/16   Fields, Marga Melnick, MD  metroNIDAZOLE (FLAGYL) 500 MG tablet Take 1 tablet (500 mg total) by mouth every 8 (eight)  hours. Patient not taking: Reported on 12/07/2016 11/20/16   Kathie Dike, MD  NIFEdipine (PROCARDIA XL/ADALAT-CC) 90 MG 24 hr tablet Take 90 mg by mouth daily. 04/10/15   [provider]  omeprazole (PRILOSEC) 20 MG capsule TAKE ONE CAPSULE BY MOUTH ONCE DAILY 05/18/16   Annitta Needs, NP  sevelamer carbonate (RENVELA) 800 MG tablet Take 2,400 mg by mouth 3 (three) times daily with meals.     [provider]    Family History Family History  Problem Relation Age of Onset  . Cancer Mother   . Diabetes Mother   . Hypertension Mother   . Diabetes Father   . Hypertension Father   . Hypertension Sister   . Colon cancer Neg Hx     Social History Social History  Substance Use Topics  . Smoking status: Never Smoker  . Smokeless tobacco: Never Used  . Alcohol use No  lives at home Lives with spouse Uses a cane   Allergies   Cephalexin   Review of Systems Review of Systems  All other systems reviewed and are negative.    Physical Exam Updated Vital Signs BP 132/83   Pulse 88   Temp 97.8 F (36.6 C) (Oral)   Resp 17   Ht 5\' 5"  (1.651 m)   Wt 126 lb (57.2 kg)   SpO2 100%   BMI 20.97 kg/m   Vital signs normal    Physical Exam  Constitutional: She is oriented to person, place, and time. She appears well-developed and well-nourished.  Non-toxic appearance. She does not appear ill. No distress.  HENT:  Head: Normocephalic and atraumatic.  Right Ear: External ear normal.  Left Ear: External ear normal.  Nose: Nose normal. No mucosal edema or rhinorrhea.  Mouth/Throat: Oropharynx is clear and moist and mucous membranes are normal. No dental abscesses or uvula swelling.  Eyes: Conjunctivae and EOM are normal. Pupils are equal, round, and reactive to light.  Neck: Normal range of motion and full passive range of motion without pain. Neck supple.  Cardiovascular: Normal rate, regular rhythm and normal heart sounds.  Exam reveals no gallop and no friction  rub.   No murmur heard. Pulmonary/Chest: Effort normal and breath sounds normal. No respiratory distress. She has no wheezes. She has no rhonchi. She has no rales. She exhibits no tenderness and no crepitus.  Abdominal: Soft. Normal appearance and bowel sounds are normal. She exhibits no distension. There is  no tenderness. There is no rebound and no guarding.  Musculoskeletal: Normal range of motion. She exhibits no edema or tenderness.  Moves all extremities well. Has a graft in her RU arm  Neurological: She is alert and oriented to person, place, and time. She has normal strength. No cranial nerve deficit.  Skin: Skin is warm, dry and intact. No rash noted. No erythema. No pallor.  Psychiatric: She has a normal mood and affect. Her speech is normal and behavior is normal. Her mood appears not anxious.  Nursing note and vitals reviewed.    ED Treatments / Results  Labs (all labs ordered are listed, but only abnormal results are displayed) Results for orders placed or performed during the hospital encounter of 12/17/16  Comprehensive metabolic panel  Result Value Ref Range   Sodium 137 135 - 145 mmol/L   Potassium 3.1 (L) 3.5 - 5.1 mmol/L   Chloride 98 (L) 101 - 111 mmol/L   CO2 27 22 - 32 mmol/L   Glucose, Bld 88 65 - 99 mg/dL   BUN 12 6 - 20 mg/dL   Creatinine, Ser 5.29 (H) 0.44 - 1.00 mg/dL   Calcium 9.5 8.9 - 10.3 mg/dL   Total Protein 8.2 (H) 6.5 - 8.1 g/dL   Albumin 3.8 3.5 - 5.0 g/dL   AST 21 15 - 41 U/L   ALT 17 14 - 54 U/L   Alkaline Phosphatase 55 38 - 126 U/L   Total Bilirubin 0.5 0.3 - 1.2 mg/dL   GFR calc non Af Amer 8 (L) >60 mL/min   GFR calc Af Amer 9 (L) >60 mL/min   Anion gap 12 5 - 15  CBC with Differential  Result Value Ref Range   WBC 5.4 4.0 - 10.5 K/uL   RBC 5.29 (H) 3.87 - 5.11 MIL/uL   Hemoglobin 13.5 12.0 - 15.0 g/dL   HCT 42.6 36.0 - 46.0 %   MCV 80.5 78.0 - 100.0 fL   MCH 25.5 (L) 26.0 - 34.0 pg   MCHC 31.7 30.0 - 36.0 g/dL   RDW 19.0 (H) 11.5  - 15.5 %   Platelets 216 150 - 400 K/uL   Neutrophils Relative % 46 %   Neutro Abs 2.5 1.7 - 7.7 K/uL   Lymphocytes Relative 42 %   Lymphs Abs 2.3 0.7 - 4.0 K/uL   Monocytes Relative 10 %   Monocytes Absolute 0.6 0.1 - 1.0 K/uL   Eosinophils Relative 1 %   Eosinophils Absolute 0.1 0.0 - 0.7 K/uL   Basophils Relative 1 %   Basophils Absolute 0.0 0.0 - 0.1 K/uL    Laboratory interpretation all normal except hypokalemia, chronic renal failure.   EKG  EKG Interpretation  Date/Time:  Friday Dec 17 2016 06:19:47 EDT Ventricular Rate:  90 PR Interval:    QRS Duration: 74 QT Interval:  352 QTC Calculation: 431 R Axis:   64 Text Interpretation:  Sinus rhythm Biatrial enlargement Probable left ventricular hypertrophy No significant change since last tracing 07 Dec 2016 Confirmed by [facility]  MD-I, Fallon Haecker ([id number]) on 12/17/2016 6:22:14 AM       Radiology No results found.   Ct Abdomen Pelvis Wo Contrast  Result Date: 11/20/2016 CLINICAL DATA:  Patient with nausea, vomiting and abdominal pain for 1 day. MPRESSION: Wall thickening of the cecum, ascending and proximal transverse colon concerning for colitis. Aortic atherosclerosis. Electronically Signed   By: Lovey Newcomer M.D.   On: 11/20/2016 10:31   Ct Head Wo Contrast  Result Date: 11/19/2016 CLINICAL DATA:  Awoke this morning with foot and hand numbness bilaterally. Speech disturbance and confusion.IMPRESSION: No acute intracranial finding. Extensive chronic small-vessel ischemic changes throughout the brain as outlined above. 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 Has this been specifically evaluated, with treatment plan? Electronically Signed   By: Nelson Chimes M.D.   On: 11/19/2016 14:44   Mr Virgel Paling HC Contrast Mr Brain Wo Contrast  Result Date: 11/19/2016 CLINICAL DATA:  Bilateral hand numbness and difficulty  speakingIMPRESSION: 1. Advanced chronic microvascular ischemia without acute intracranial abnormality. 2. 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 3. Multifocal mild to moderate narrowing of the left middle cerebral artery M1 and M2 segments, likely atherosclerotic in origin. No intracranial occlusion. 4. Multifocal narrowing of the petrous segment of the right internal carotid artery. Electronically Signed   By: Ulyses Jarred M.D.   On: 11/19/2016 21:57   Procedures Procedures (including critical care time)  Medications Ordered in ED Medications  sodium chloride 0.9 % bolus 500 mL (0 mLs Intravenous Stopped 12/17/16 0723)     Initial Impression / Assessment and Plan / ED Course  I have reviewed the triage vital signs and the nursing notes.  Pertinent labs & imaging results that were available during my care of the patient were reviewed by me and considered in my medical decision making (see chart for details).  Patient was given a 500 mL bolus of fluid. Laboratory testing was ordered.  Recheck 7:20 AM patient has received 500 mL of fluid. She states she doesn't feel any change, she states she is just very tired. She is going to try to give [place] a urine sample to test.  Pt left at change of shift with Dr Reather Converse to check her UA. Most likely it will be okay. She can follow up with her PCP about her chronic weakness/dizziness and loss of appetite with weight loss. She had a recent CT scan showing colitis what was treated and she no longer has abdominal pain.  She is already on valium and meclizine for chronic dizziness.   Final Clinical Impressions(s) / ED Diagnoses   Final diagnoses:  Generalized weakness  Weight loss    Plan probable discharge  Rolland Porter, MD, [name], MD 12/17/16 [phone]

## 2016-12-17 NOTE — ED Triage Notes (Signed)
Pt states she gets dialysis on Tues, Thurs, Sat.  After dialysis on Tuesday she started feeling sick, nauseated with any meals, generalized weakness.   Pt states she hasn't felt like eating.

## 2016-12-17 NOTE — Telephone Encounter (Signed)
Pt came by the office, just been to ED. She said they told her to follow up with GI. She has no appetite and not able to eat hardly anything. She has lost weight and stays dizzy. No vomiting. No blood in stools, although she doesn't have a lot of stools.  She is aware that Dr. Oneida Alar is at the hospital and that we leave the office at noon today. She just wants Dr. Oneida Alar to know and see when she can be seen.

## 2016-12-17 NOTE — Telephone Encounter (Signed)
REVIEWED-NO ADDITIONAL RECOMMENDATIONS. 

## 2016-12-17 NOTE — Discharge Instructions (Signed)
Your tests today do not show a reason for your weakness and loss of appetite. You recently had an abdominal/pelvic CT scan which showed the bowel inflammation which was treated. You are not having abdominal pain now so that should be resolved. Please try to see your primary care doctor again to discuss your loss of appetite and weight loss.

## 2016-12-17 NOTE — ED Provider Notes (Signed)
Patient's care signed out to follow-up urinalysis. Patient does not produce urine. Patient had general weakness workup and has outpatient follow-up.  Melanni Benway, Paula Compton, MD 12/17/16 [phone]

## 2016-12-18 DIAGNOSIS — Z992 Dependence on renal dialysis: Secondary | ICD-10-CM | POA: Diagnosis not present

## 2016-12-18 DIAGNOSIS — R112 Nausea with vomiting, unspecified: Secondary | ICD-10-CM | POA: Diagnosis not present

## 2016-12-18 DIAGNOSIS — D631 Anemia in chronic kidney disease: Secondary | ICD-10-CM | POA: Diagnosis not present

## 2016-12-18 DIAGNOSIS — D509 Iron deficiency anemia, unspecified: Secondary | ICD-10-CM | POA: Diagnosis not present

## 2016-12-18 DIAGNOSIS — N186 End stage renal disease: Secondary | ICD-10-CM | POA: Diagnosis not present

## 2016-12-18 DIAGNOSIS — N2581 Secondary hyperparathyroidism of renal origin: Secondary | ICD-10-CM | POA: Diagnosis not present

## 2016-12-20 NOTE — Telephone Encounter (Signed)
OPV MAY 30@1115 , DX: ANOREXIA

## 2016-12-20 NOTE — Telephone Encounter (Signed)
PT is aware of the appt date and time. Erline Levine, please enter the appt.

## 2016-12-20 NOTE — Telephone Encounter (Signed)
Patient already on the schedule for that day

## 2016-12-20 NOTE — Telephone Encounter (Signed)
Pt would like to know when Dr. Oneida Alar can see her?

## 2016-12-21 DIAGNOSIS — D631 Anemia in chronic kidney disease: Secondary | ICD-10-CM | POA: Diagnosis not present

## 2016-12-21 DIAGNOSIS — Z992 Dependence on renal dialysis: Secondary | ICD-10-CM | POA: Diagnosis not present

## 2016-12-21 DIAGNOSIS — N186 End stage renal disease: Secondary | ICD-10-CM | POA: Diagnosis not present

## 2016-12-21 DIAGNOSIS — N2581 Secondary hyperparathyroidism of renal origin: Secondary | ICD-10-CM | POA: Diagnosis not present

## 2016-12-21 DIAGNOSIS — D509 Iron deficiency anemia, unspecified: Secondary | ICD-10-CM | POA: Diagnosis not present

## 2016-12-21 DIAGNOSIS — R112 Nausea with vomiting, unspecified: Secondary | ICD-10-CM | POA: Diagnosis not present

## 2016-12-23 DIAGNOSIS — N2581 Secondary hyperparathyroidism of renal origin: Secondary | ICD-10-CM | POA: Diagnosis not present

## 2016-12-23 DIAGNOSIS — D509 Iron deficiency anemia, unspecified: Secondary | ICD-10-CM | POA: Diagnosis not present

## 2016-12-23 DIAGNOSIS — D631 Anemia in chronic kidney disease: Secondary | ICD-10-CM | POA: Diagnosis not present

## 2016-12-23 DIAGNOSIS — N186 End stage renal disease: Secondary | ICD-10-CM | POA: Diagnosis not present

## 2016-12-23 DIAGNOSIS — R112 Nausea with vomiting, unspecified: Secondary | ICD-10-CM | POA: Diagnosis not present

## 2016-12-23 DIAGNOSIS — Z992 Dependence on renal dialysis: Secondary | ICD-10-CM | POA: Diagnosis not present

## 2016-12-25 DIAGNOSIS — D509 Iron deficiency anemia, unspecified: Secondary | ICD-10-CM | POA: Diagnosis not present

## 2016-12-25 DIAGNOSIS — R112 Nausea with vomiting, unspecified: Secondary | ICD-10-CM | POA: Diagnosis not present

## 2016-12-25 DIAGNOSIS — N186 End stage renal disease: Secondary | ICD-10-CM | POA: Diagnosis not present

## 2016-12-25 DIAGNOSIS — Z992 Dependence on renal dialysis: Secondary | ICD-10-CM | POA: Diagnosis not present

## 2016-12-25 DIAGNOSIS — N2581 Secondary hyperparathyroidism of renal origin: Secondary | ICD-10-CM | POA: Diagnosis not present

## 2016-12-25 DIAGNOSIS — D631 Anemia in chronic kidney disease: Secondary | ICD-10-CM | POA: Diagnosis not present

## 2016-12-28 DIAGNOSIS — N2581 Secondary hyperparathyroidism of renal origin: Secondary | ICD-10-CM | POA: Diagnosis not present

## 2016-12-28 DIAGNOSIS — R112 Nausea with vomiting, unspecified: Secondary | ICD-10-CM | POA: Diagnosis not present

## 2016-12-28 DIAGNOSIS — D509 Iron deficiency anemia, unspecified: Secondary | ICD-10-CM | POA: Diagnosis not present

## 2016-12-28 DIAGNOSIS — N186 End stage renal disease: Secondary | ICD-10-CM | POA: Diagnosis not present

## 2016-12-28 DIAGNOSIS — Z992 Dependence on renal dialysis: Secondary | ICD-10-CM | POA: Diagnosis not present

## 2016-12-28 DIAGNOSIS — D631 Anemia in chronic kidney disease: Secondary | ICD-10-CM | POA: Diagnosis not present

## 2016-12-29 ENCOUNTER — Telehealth: Payer: Self-pay

## 2016-12-29 ENCOUNTER — Ambulatory Visit (INDEPENDENT_AMBULATORY_CARE_PROVIDER_SITE_OTHER): Payer: Medicare Other | Admitting: Gastroenterology

## 2016-12-29 ENCOUNTER — Encounter: Payer: Self-pay | Admitting: Gastroenterology

## 2016-12-29 DIAGNOSIS — R1110 Vomiting, unspecified: Secondary | ICD-10-CM

## 2016-12-29 DIAGNOSIS — E43 Unspecified severe protein-calorie malnutrition: Secondary | ICD-10-CM | POA: Diagnosis not present

## 2016-12-29 DIAGNOSIS — R634 Abnormal weight loss: Secondary | ICD-10-CM

## 2016-12-29 DIAGNOSIS — IMO0001 Reserved for inherently not codable concepts without codable children: Secondary | ICD-10-CM

## 2016-12-29 MED ORDER — ALPRAZOLAM 0.5 MG PO TABS: 0.5000 mg | ORAL_TABLET | Freq: Two times a day (BID) | ORAL | 3 refills | Status: DC

## 2016-12-29 NOTE — Progress Notes (Signed)
appt made

## 2016-12-29 NOTE — Patient Instructions (Addendum)
TO BETTER CONTROL YOUR ANXIETY, USE XANAX IN THE MORNING AND IN THE EVENING. ONLY USE VALIUM IF NEEDED TO CONTROL ANXIETY.  USE RESOURCE FRUIT BEVERAGE THREE TIMES A DAY.  PLEASE CALL ME WITH HOSPICE NUMBERS SO WE CAN GET HELP FOR YOUR AT HOME.  CONTINUE OMEPRAZOLE.  TAKE 30 MINUTES PRIOR TO YOUR FIRST MEAL.  FOLLOW UP IN 6 WEEKS.

## 2016-12-29 NOTE — Assessment & Plan Note (Signed)
RECENT LABS SHOW NORMAL ALBUMIN AND CALCIUM.  CONTINUE TO MONITOR SYMPTOMS.

## 2016-12-29 NOTE — Progress Notes (Signed)
cc'ed to pcp °

## 2016-12-29 NOTE — Assessment & Plan Note (Addendum)
WEIGHT 166 LBS IN 2015 AND DOWN TO 128 LBS OCT 2017 AND NOW 134 LBS. ASSOCIATED WITH FATIGUE AND WEAKNESS, BUT PT NOT COMPLETING DIALYSIS AND HAS A HUSBAND WHO WAS HER PRIMARY SUPPORT RECENTLY DIAGNOSED WITH STAGE IV CANCER. REVIEWED LABS AND IMAGING FROM 2015 TO PRESENT. REASSURED PT NO SIGNIFICANT [ATHOLOGY ON LABS/IMAGING. RECENT TCS/EGD/FLEX SIG WITHIN THE LAST 3 YEARS. FATIGUE/WEAKNESS DUE TO INCOMPLETE DIALYSIS SESSION AND DECREASED PO INTAKE. NL TSH IN 2018.  TO BETTER CONTROL YOUR ANXIETY, USE XANAX IN THE MORNING AND IN THE EVENING. ONLY USE VALIUM IF NEEDED TO CONTROL ANXIETY. PT NOT A CANDIDATE FOR BUSPAR DUE TO ESRD. DISCUSSED WITH PHARMACY. USE RESOURCE FRUIT BEVERAGE THREE TIMES A DAY. PT CANNOT TOLERATE MILK PRODUCTS. PT WILL CALL ME WITH HOSPICE NUMBERS SO WE CAN GET HELP FOR YOUR AT HOME.  FOLLOW UP IN 6 WEEKS.  GREATER THAN 50% WAS SPENT IN COUNSELING & COORDINATION OF CARE WITH THE PATIENT: DISCUSSED DIFFERENTIAL DIAGNOSIS, PROCEDURE, BENEFITS, AND MANAGEMENT OF FATIGUE/WEAKNESS. TOTAL ENCOUNTER TIME: 40 MINS.

## 2016-12-29 NOTE — Telephone Encounter (Signed)
Pt called to give some phone numbers to Dr. Oneida Alar.  Hospice                                            [facility]  [address]                                               [phone]   Also, she said she could not find the fruit beverage Dr. Oneida Alar told her about. She can only find vanilla flavor.

## 2016-12-29 NOTE — Progress Notes (Signed)
Subjective:    Patient ID: Sonya [name], female    DOB: 07-09-1951, 65 y.o.   MRN: [id number]  [employer], MD   HPI Concerned because her dry weight is dropping. Feels weak and dizzy all the time. WANTS ME TO HELP HER FIGURE OUT WHAT'S GOING ON. APPETITE: DOWN. WANTS EAT BUT AFTER SHE FIXES CAN ONLY TAKE 2-3 BITES AND DOESN'T WANT ANYMORE. HAS LOTS OF COLD ONE MINUTE AND HOT THE NEXT. TROUBLE WITH APPETITE FOR 3-4 MOS. HOSPITALIZED FOR DEHYDRATION. THYROID NOT CHECKED? BMs: DON'T MOVE ON THEIR OWN. TAKES A DULCOLAX AND IT MAKES IT MOVE. URINATES VERY LITTLE.  2 MOS HAD NAUSEA/VOMITING-? VIRUS. NOW CAN KEEP SOMETHING DOWN. BEEN FEELING SOB: NERVES TORE TO PIECES. HUSBAND DOING FINE AND THEN HE GOT SICK AND NOW HAS STAGE IV CANCER NOW. DIAGNOSED 2 MOS AGO. LOTS OF STRESS AND WHEN NOT AT DIALYSIS THEN WORRIED ABOUT HIM. NOT GETTING ANYTHING FOR HER NERVES. HARD TO GET AN APPT W/ [facility]CO AND [facility]ELDS. SHORTS DIALYSIS DUE TO ANXIETY. VALIUM HELPS. NO HELP CURRENTLY AT THE HOUSE. LAST TWO YEARS HUSBAND HAS BEEN CARE GIVER AND NOW HE HAS STAGE IV CANCER.   PT DENIES FEVER, CHILLS, HEMATOCHEZIA, HEMATEMESIS, nausea, vomiting, melena, diarrhea,CHEST PAIN, SHORTNESS OF BREATH, CHANGE IN BOWEL IN HABITS, abdominal pain, problems swallowing, OR heartburn or indigestion.  Past Medical History:  Diagnosis Date  . Anemia   . CKD (chronic kidney disease)   . Diverticulitis   . Dysphagia   . ESRD (end stage renal disease) ([place])    On HD  . Fracture of femoral neck, left, closed ([place]) 05/30/2015  . Gastritis 06/2013 & 05/2015  . Gastroesophageal reflux disease with stricture 05/2015  . GERD (gastroesophageal reflux disease)   . HTN (hypertension)   . Hyperparathyroidism due to renal insufficiency ([place])   . Hypertensive urgency 06/2013 & 04/2015  . Hypothyroidism   . ICH (intracerebral hemorrhage) ([place]) 10/2014  . 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 Protein calorie malnutrition ([place])   . Stroke ([place])   . Uterine  cancer ([place]) 2012  . Vertical diplopia   . Vertigo    Past Surgical History:  Procedure Laterality Date  . ABDOMINAL HYSTERECTOMY    . APPENDECTOMY    . BASCILIC VEIN TRANSPOSITION Right 09/17/2013   Procedure: BRACHIAL VEIN TRANSPOSITION;  Surgeon: Conrad Scooba, MD;  Location: [facility];  Service: Vascular;  Laterality: Right;  . BASCILIC VEIN TRANSPOSITION Right 10/29/2013   Procedure: RIGHT SECOND STAGE BRACHIAL VEIN TRANSPOSITION;  Surgeon: Conrad Westville, MD;  Location: [facility];  Service: Vascular;  Laterality: Right;  . CESAREAN SECTION    . CHOLECYSTECTOMY    . COLONOSCOPY  2000   TICS, IH  . COLONOSCOPY  2003 NUR BRBPR D50 V6   DC/[place] TICS, IH  . COLONOSCOPY N/A 09/04/2013   SLF: Small ulcer in the descending colon, one colon polyp (tubular adenoma), large internal hemorrhoids, moderate diverticulosis. next tcs 10 years.  . ESOPHAGOGASTRODUODENOSCOPY N/A 05/23/2015   EUM:PNTI non-erosive gastritis/stricture at the gastroesophageal junction  . ESOPHAGOGASTRODUODENOSCOPY (EGD) WITH ESOPHAGEAL DILATION N/A 06/08/2013   Dr. Fields:Stricture at the gastroesophageal junction/MILD NON-erosive gastritis (inflammation) was found in the gastric antrum; multiple biopsies/NO SOURCE FOR ANEMIA IDETIFIED-MOST LIKELY DUE TO ANEMIA OF CHRONIC DISEASE  . FLEXIBLE SIGMOIDOSCOPY N/A 01/02/2016   Dr. Oneida Alar: external hemorrhoids, diverticulosis, internal hemorrhoids, proximal rectum normal  . GIVENS CAPSULE STUDY N/A 12/03/2013   SLF: occasional gastric erosion. small bowel normal  . HIP PINNING,CANNULATED Left  05/31/2015   Procedure: CANNULATED HIP PINNING;  Surgeon: Leandrew Koyanagi, MD;  Location: [facility];  Service: Orthopedics;  Laterality: Left;  . INSERTION OF DIALYSIS CATHETER Left 09/17/2013   Procedure: INSERTION OF DIALYSIS CATHETER;  Surgeon: Conrad Polo, MD;  Location: [facility];  Service: Vascular;  Laterality: Left;  . LAPAROSCOPIC TOTAL HYSTERECTOMY    . NASAL SEPTUM SURGERY    . SAVORY DILATION N/A  05/23/2015   Procedure: SAVORY DILATION;  Surgeon: Danie Binder, MD;  [facility];  Service: Endoscopy;  Laterality: N/A;   Allergies  Allergen Reactions  . Cephalexin Itching   Current Outpatient Prescriptions  Medication Sig Dispense Refill  . alendronate (FOSAMAX) 70 MG tablet Take 70 mg by mouth once a week. Take with a full glass of water on an empty stomach.    Marland Kitchen allopurinol (ZYLOPRIM) 300 MG tablet Take 0.5 tablets (150 mg total) by mouth daily. Dose was decreased secondary to renal failure. (Patient taking differently: Take 300 mg by mouth daily. Dose was decreased secondary to renal failure.)    . bisacodyl (DULCOLAX) 5 MG EC tablet Take 5 mg by mouth daily as needed for moderate constipation.    . cinacalcet (SENSIPAR) 30 MG tablet Take 30 mg by mouth daily.    . diazepam (VALIUM) 2 MG tablet Take 2 mg by mouth every 6 (six) hours as needed for anxiety.    .      . labetalol (NORMODYNE) 200 MG tablet Take 400 mg by mouth 2 (two) times daily.     Marland Kitchen levothyroxine (SYNTHROID, LEVOTHROID) 50 MCG tablet Take 1 tablet (50 mcg total) by mouth daily.    Marland Kitchen lidocaine-prilocaine (EMLA) cream Apply 1 application topically every Tuesday, Thursday, and Saturday at 6 PM.     . meclizine (ANTIVERT) 25 MG tablet Take 1 tablet by mouth every 6 (six) hours as needed for dizziness. dizziness    . metoCLOPramide (REGLAN) 5 MG tablet 1 po before breakfast.    . NIFEdipine (PROCARDIA XL/ADALAT-CC) 90 MG 24 hr tablet Take 90 mg by mouth daily.    Marland Kitchen omeprazole (PRILOSEC) 20 MG capsule TAKE ONE CAPSULE BY MOUTH ONCE DAILY    . sevelamer carbonate (RENVELA) 800 MG tablet Take 2,400 mg by mouth 3 (three) times daily with meals.     .      .       Review of Systems PER HPI OTHERWISE ALL SYSTEMS ARE NEGATIVE.    Objective:   Physical Exam  Constitutional: She is oriented to person, place, and time. She appears well-developed and well-nourished. No distress.  HENT:  Head: Normocephalic and  atraumatic.  Mouth/Throat: Oropharynx is clear and moist. No oropharyngeal exudate.  Eyes: Pupils are equal, round, and reactive to light. No scleral icterus.  Neck: Normal range of motion. Neck supple.  Cardiovascular: Normal rate, regular rhythm and normal heart sounds.   Pulmonary/Chest: Effort normal and breath sounds normal. No respiratory distress.  Abdominal: Soft. Bowel sounds are normal. She exhibits no distension. There is tenderness. There is no rebound and no guarding.  MILD LLQ TTP   Musculoskeletal: She exhibits no edema.  Lymphadenopathy:    She has no cervical adenopathy.  Neurological: She is alert and oriented to person, place, and time.  Psychiatric: She has a normal mood and affect.  Vitals reviewed.     Assessment & Plan:

## 2016-12-29 NOTE — Assessment & Plan Note (Signed)
SYMPTOMS CONTROLLED/RESOLVED.  CONTINUE TO MONITOR SYMPTOMS. CONTINUE OMEPRAZOLE.  TAKE 30 MINUTES PRIOR TO YOUR FIRST MEAL.

## 2016-12-30 DIAGNOSIS — Z992 Dependence on renal dialysis: Secondary | ICD-10-CM | POA: Diagnosis not present

## 2016-12-30 DIAGNOSIS — R112 Nausea with vomiting, unspecified: Secondary | ICD-10-CM | POA: Diagnosis not present

## 2016-12-30 DIAGNOSIS — N2581 Secondary hyperparathyroidism of renal origin: Secondary | ICD-10-CM | POA: Diagnosis not present

## 2016-12-30 DIAGNOSIS — D631 Anemia in chronic kidney disease: Secondary | ICD-10-CM | POA: Diagnosis not present

## 2016-12-30 DIAGNOSIS — D509 Iron deficiency anemia, unspecified: Secondary | ICD-10-CM | POA: Diagnosis not present

## 2016-12-30 DIAGNOSIS — N186 End stage renal disease: Secondary | ICD-10-CM | POA: Diagnosis not present

## 2016-12-30 NOTE — Telephone Encounter (Signed)
PLEASE CALL PT. HOSPICE REFERRAL HAS BEEN MADE. THEY WILL COME FOR A VSISIT ON JUN 1.

## 2016-12-30 NOTE — Telephone Encounter (Signed)
PT is aware.

## 2016-12-31 DIAGNOSIS — N186 End stage renal disease: Secondary | ICD-10-CM | POA: Diagnosis not present

## 2016-12-31 DIAGNOSIS — D631 Anemia in chronic kidney disease: Secondary | ICD-10-CM | POA: Diagnosis not present

## 2016-12-31 DIAGNOSIS — R112 Nausea with vomiting, unspecified: Secondary | ICD-10-CM | POA: Diagnosis not present

## 2016-12-31 DIAGNOSIS — N2581 Secondary hyperparathyroidism of renal origin: Secondary | ICD-10-CM | POA: Diagnosis not present

## 2016-12-31 DIAGNOSIS — Z992 Dependence on renal dialysis: Secondary | ICD-10-CM | POA: Diagnosis not present

## 2016-12-31 DIAGNOSIS — D509 Iron deficiency anemia, unspecified: Secondary | ICD-10-CM | POA: Diagnosis not present

## 2017-01-01 DIAGNOSIS — Z992 Dependence on renal dialysis: Secondary | ICD-10-CM | POA: Diagnosis not present

## 2017-01-01 DIAGNOSIS — D631 Anemia in chronic kidney disease: Secondary | ICD-10-CM | POA: Diagnosis not present

## 2017-01-01 DIAGNOSIS — N2581 Secondary hyperparathyroidism of renal origin: Secondary | ICD-10-CM | POA: Diagnosis not present

## 2017-01-01 DIAGNOSIS — D509 Iron deficiency anemia, unspecified: Secondary | ICD-10-CM | POA: Diagnosis not present

## 2017-01-01 DIAGNOSIS — R112 Nausea with vomiting, unspecified: Secondary | ICD-10-CM | POA: Diagnosis not present

## 2017-01-01 DIAGNOSIS — N186 End stage renal disease: Secondary | ICD-10-CM | POA: Diagnosis not present

## 2017-01-04 DIAGNOSIS — N2581 Secondary hyperparathyroidism of renal origin: Secondary | ICD-10-CM | POA: Diagnosis not present

## 2017-01-04 DIAGNOSIS — Z992 Dependence on renal dialysis: Secondary | ICD-10-CM | POA: Diagnosis not present

## 2017-01-04 DIAGNOSIS — N186 End stage renal disease: Secondary | ICD-10-CM | POA: Diagnosis not present

## 2017-01-04 DIAGNOSIS — R112 Nausea with vomiting, unspecified: Secondary | ICD-10-CM | POA: Diagnosis not present

## 2017-01-04 DIAGNOSIS — D509 Iron deficiency anemia, unspecified: Secondary | ICD-10-CM | POA: Diagnosis not present

## 2017-01-04 DIAGNOSIS — D631 Anemia in chronic kidney disease: Secondary | ICD-10-CM | POA: Diagnosis not present

## 2017-01-06 DIAGNOSIS — Z992 Dependence on renal dialysis: Secondary | ICD-10-CM | POA: Diagnosis not present

## 2017-01-06 DIAGNOSIS — N2581 Secondary hyperparathyroidism of renal origin: Secondary | ICD-10-CM | POA: Diagnosis not present

## 2017-01-06 DIAGNOSIS — N186 End stage renal disease: Secondary | ICD-10-CM | POA: Diagnosis not present

## 2017-01-06 DIAGNOSIS — D509 Iron deficiency anemia, unspecified: Secondary | ICD-10-CM | POA: Diagnosis not present

## 2017-01-06 DIAGNOSIS — D631 Anemia in chronic kidney disease: Secondary | ICD-10-CM | POA: Diagnosis not present

## 2017-01-06 DIAGNOSIS — R112 Nausea with vomiting, unspecified: Secondary | ICD-10-CM | POA: Diagnosis not present

## 2017-01-08 DIAGNOSIS — D631 Anemia in chronic kidney disease: Secondary | ICD-10-CM | POA: Diagnosis not present

## 2017-01-08 DIAGNOSIS — N2581 Secondary hyperparathyroidism of renal origin: Secondary | ICD-10-CM | POA: Diagnosis not present

## 2017-01-08 DIAGNOSIS — D509 Iron deficiency anemia, unspecified: Secondary | ICD-10-CM | POA: Diagnosis not present

## 2017-01-08 DIAGNOSIS — R112 Nausea with vomiting, unspecified: Secondary | ICD-10-CM | POA: Diagnosis not present

## 2017-01-08 DIAGNOSIS — N186 End stage renal disease: Secondary | ICD-10-CM | POA: Diagnosis not present

## 2017-01-08 DIAGNOSIS — Z992 Dependence on renal dialysis: Secondary | ICD-10-CM | POA: Diagnosis not present

## 2017-01-11 DIAGNOSIS — D509 Iron deficiency anemia, unspecified: Secondary | ICD-10-CM | POA: Diagnosis not present

## 2017-01-11 DIAGNOSIS — N2581 Secondary hyperparathyroidism of renal origin: Secondary | ICD-10-CM | POA: Diagnosis not present

## 2017-01-11 DIAGNOSIS — R112 Nausea with vomiting, unspecified: Secondary | ICD-10-CM | POA: Diagnosis not present

## 2017-01-11 DIAGNOSIS — D631 Anemia in chronic kidney disease: Secondary | ICD-10-CM | POA: Diagnosis not present

## 2017-01-11 DIAGNOSIS — Z992 Dependence on renal dialysis: Secondary | ICD-10-CM | POA: Diagnosis not present

## 2017-01-11 DIAGNOSIS — N186 End stage renal disease: Secondary | ICD-10-CM | POA: Diagnosis not present

## 2017-01-13 DIAGNOSIS — N2581 Secondary hyperparathyroidism of renal origin: Secondary | ICD-10-CM | POA: Diagnosis not present

## 2017-01-13 DIAGNOSIS — D631 Anemia in chronic kidney disease: Secondary | ICD-10-CM | POA: Diagnosis not present

## 2017-01-13 DIAGNOSIS — R112 Nausea with vomiting, unspecified: Secondary | ICD-10-CM | POA: Diagnosis not present

## 2017-01-13 DIAGNOSIS — Z992 Dependence on renal dialysis: Secondary | ICD-10-CM | POA: Diagnosis not present

## 2017-01-13 DIAGNOSIS — N186 End stage renal disease: Secondary | ICD-10-CM | POA: Diagnosis not present

## 2017-01-13 DIAGNOSIS — D509 Iron deficiency anemia, unspecified: Secondary | ICD-10-CM | POA: Diagnosis not present

## 2017-01-14 IMAGING — CT CT HEAD W/O CM
1 series · 15 of 30 positions shown, 19 images · non-contrast
Comparison: 10/15/2014

CLINICAL DATA: Dizziness and confusion. Recent left-sided external
capsule and lentiform nucleus infarction and hemorrhage.

EXAM:
CT HEAD WITHOUT CONTRAST
TECHNIQUE: Contiguous axial images were obtained from the base of the skull
through the vertex without intravenous contrast.

[Series 2: headseq 4.8 h37s · axial · 0.43mm/px · z∈[+77,+237]mm · 15 of 36 slices shown, 19 images]
[im 2/36  brain]
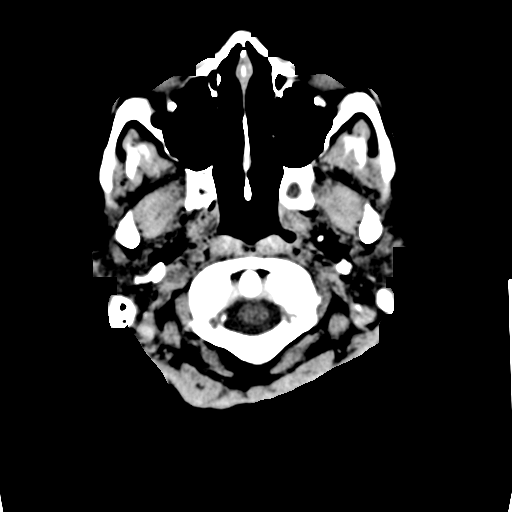
[im 2/36  bone]
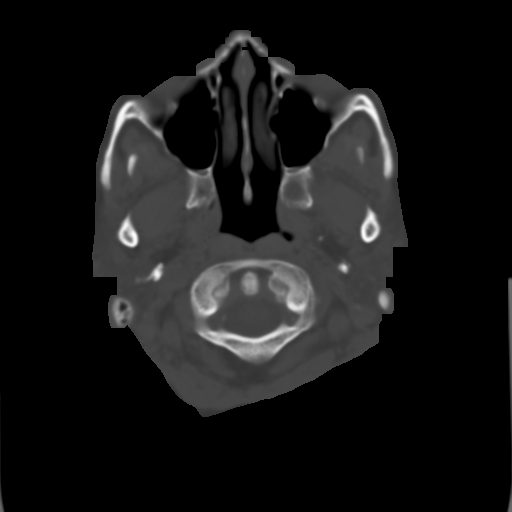
[im 4/36  brain]
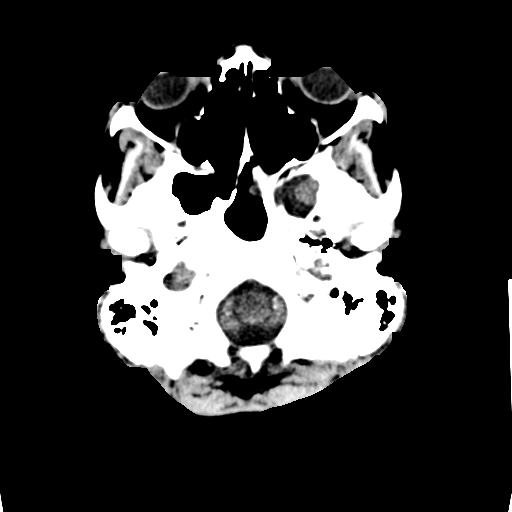
[im 7/36  brain]
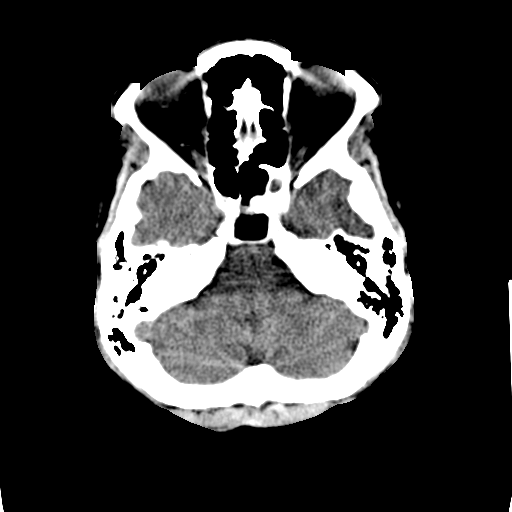
[im 9/36  brain]
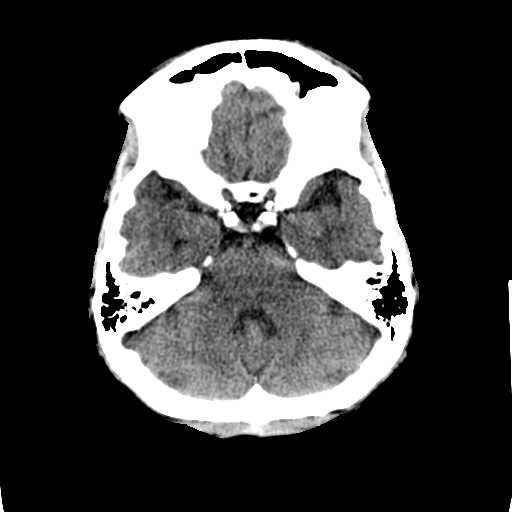
[im 11/36  brain]
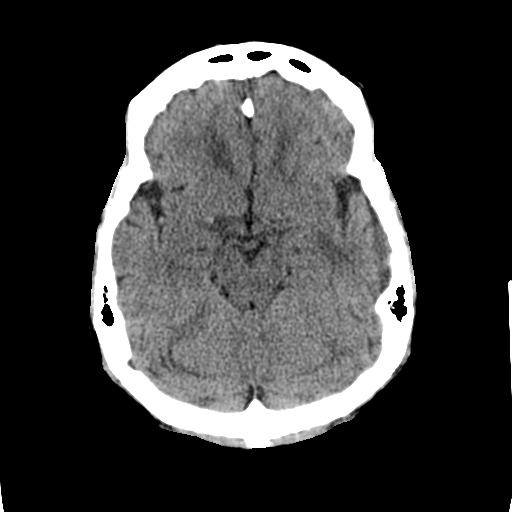
[im 11/36  bone]
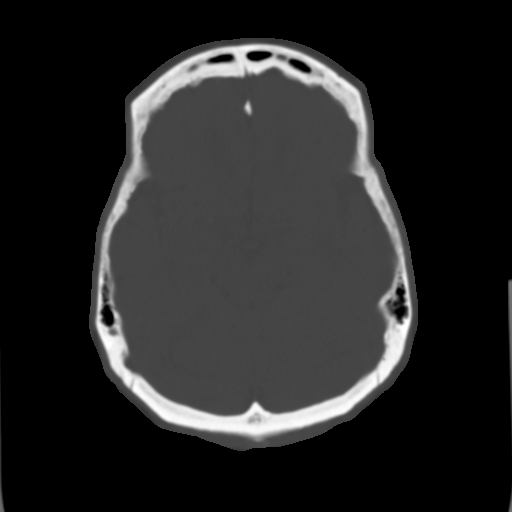
[im 14/36  brain]
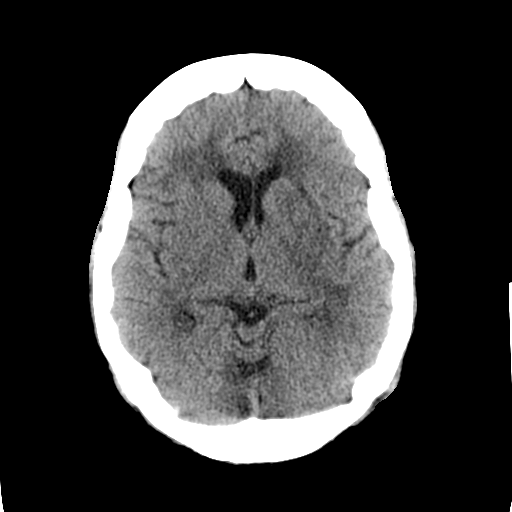
[im 16/36  brain]
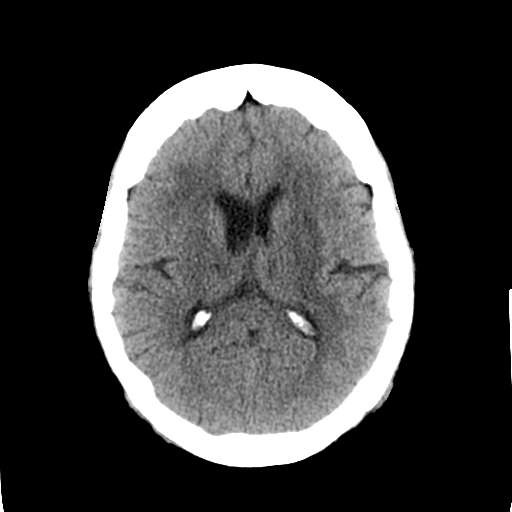
[im 19/36  brain]
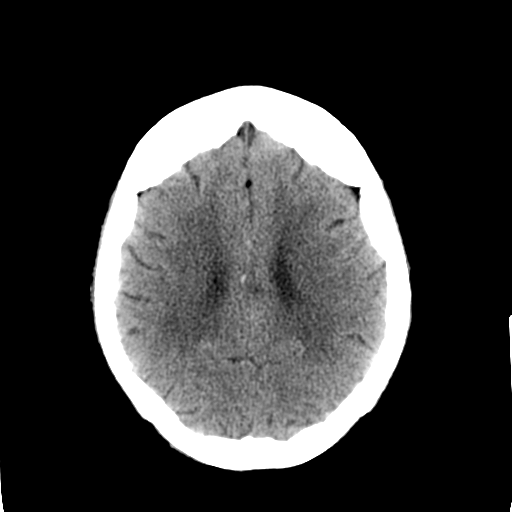
[im 20/36  brain]
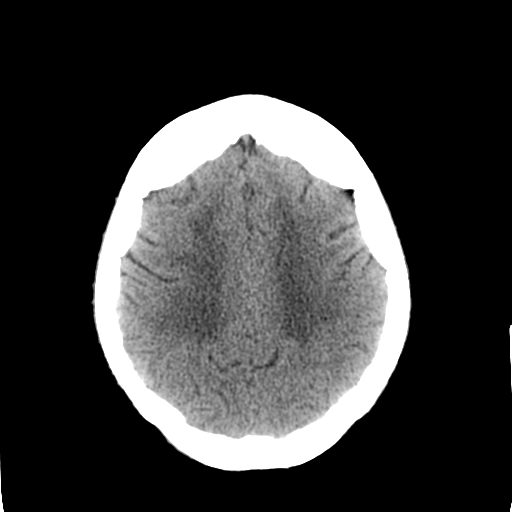
[im 20/36  bone]
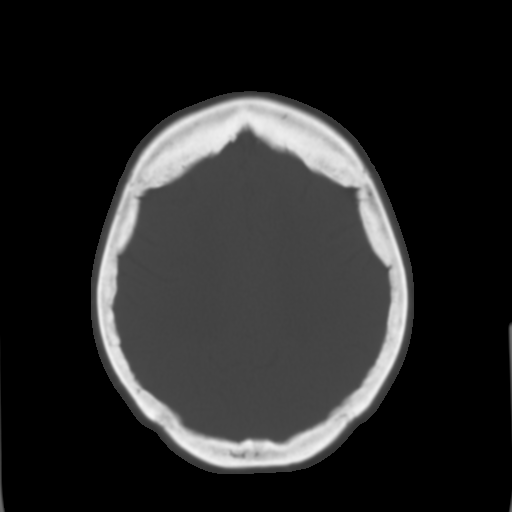
[im 22/36  brain]
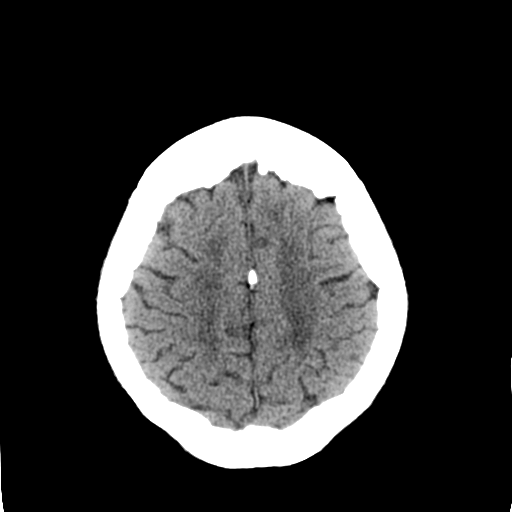
[im 25/36  brain]
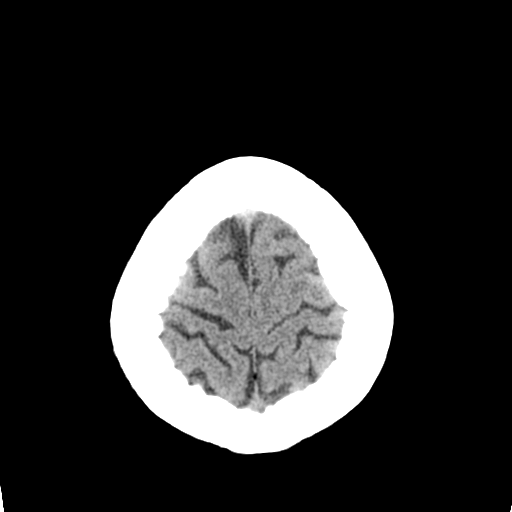
[im 27/36  brain]
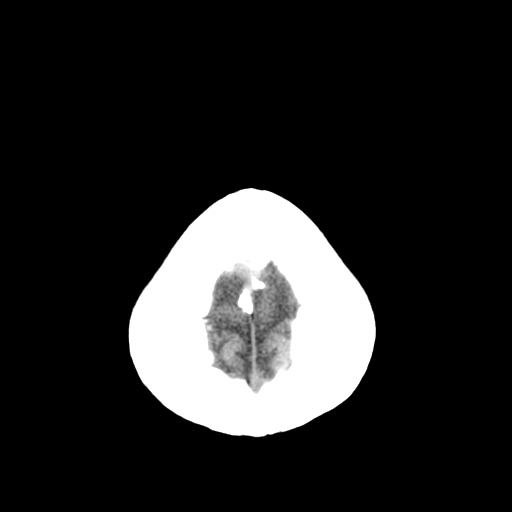
[im 29/36  brain]
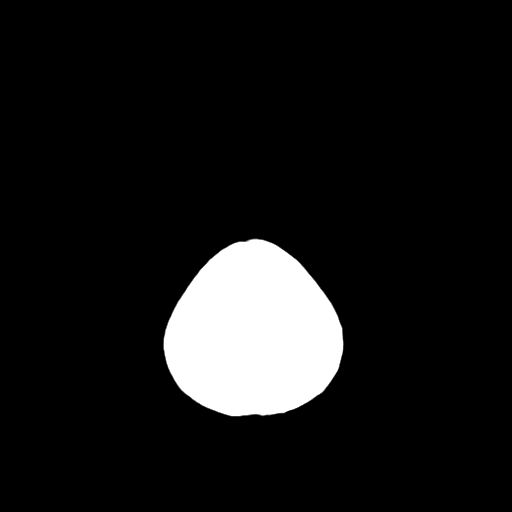
[im 29/36  bone]
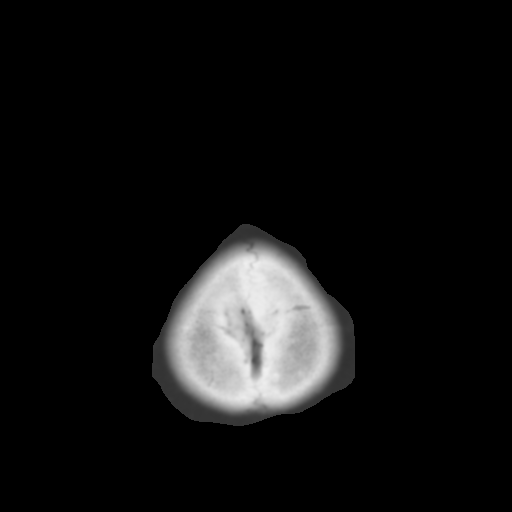
[im 32/36  brain]
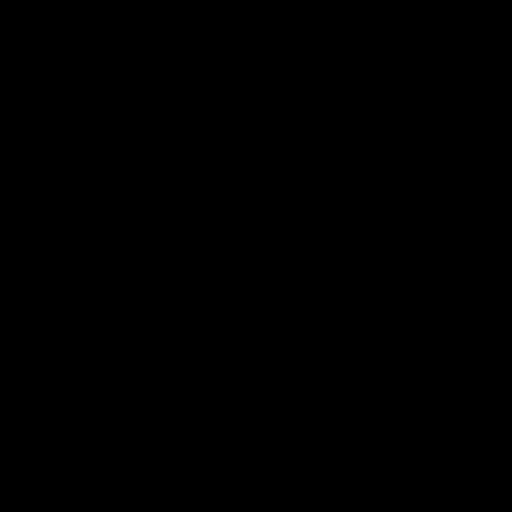
[im 34/36  brain]
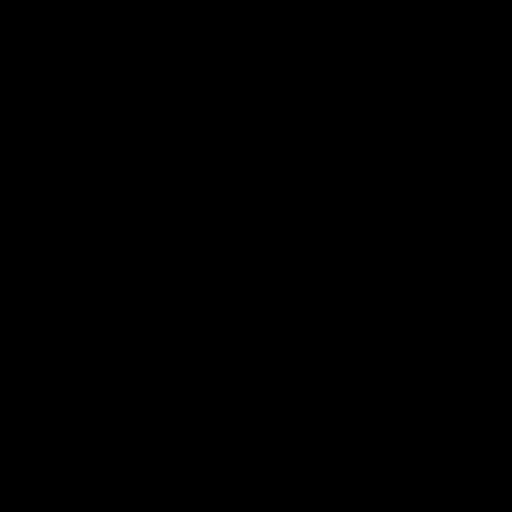

[15 of 30 positions shown; findings below may reference images not displayed]

FINDINGS: Involving and stable appearance to infarction involving the
posterior left basal ganglia and extending to involve the posterior
limb of the internal capsule and the adjacent external capsule. No
new hemorrhage or mass effect identified. Stable old lacunar
infarcts of bilateral bowel my. No cortical infarction,
hydrocephalus or extra-axial fluid collection identified. Stable
appearance of meningocele and encephalocele projecting into the left
sphenoid sinus.
IMPRESSION: No change in appearance of head CT since the most recent study.
Stable evolving left-sided white matter infarction involving the
basal ganglia, posterior limb of the internal capsule and external
capsule. No new hemorrhage or infarction identified. Stable
encephalocele into left sphenoid sinus.

## 2017-01-14 IMAGING — DX DG CHEST 2V
2 series · 2 of 2 positions shown · non-contrast
Comparison: 10/14/2014

CLINICAL DATA: Renal failure and confusion

EXAM:
CHEST  2 VIEW

[chest pa]
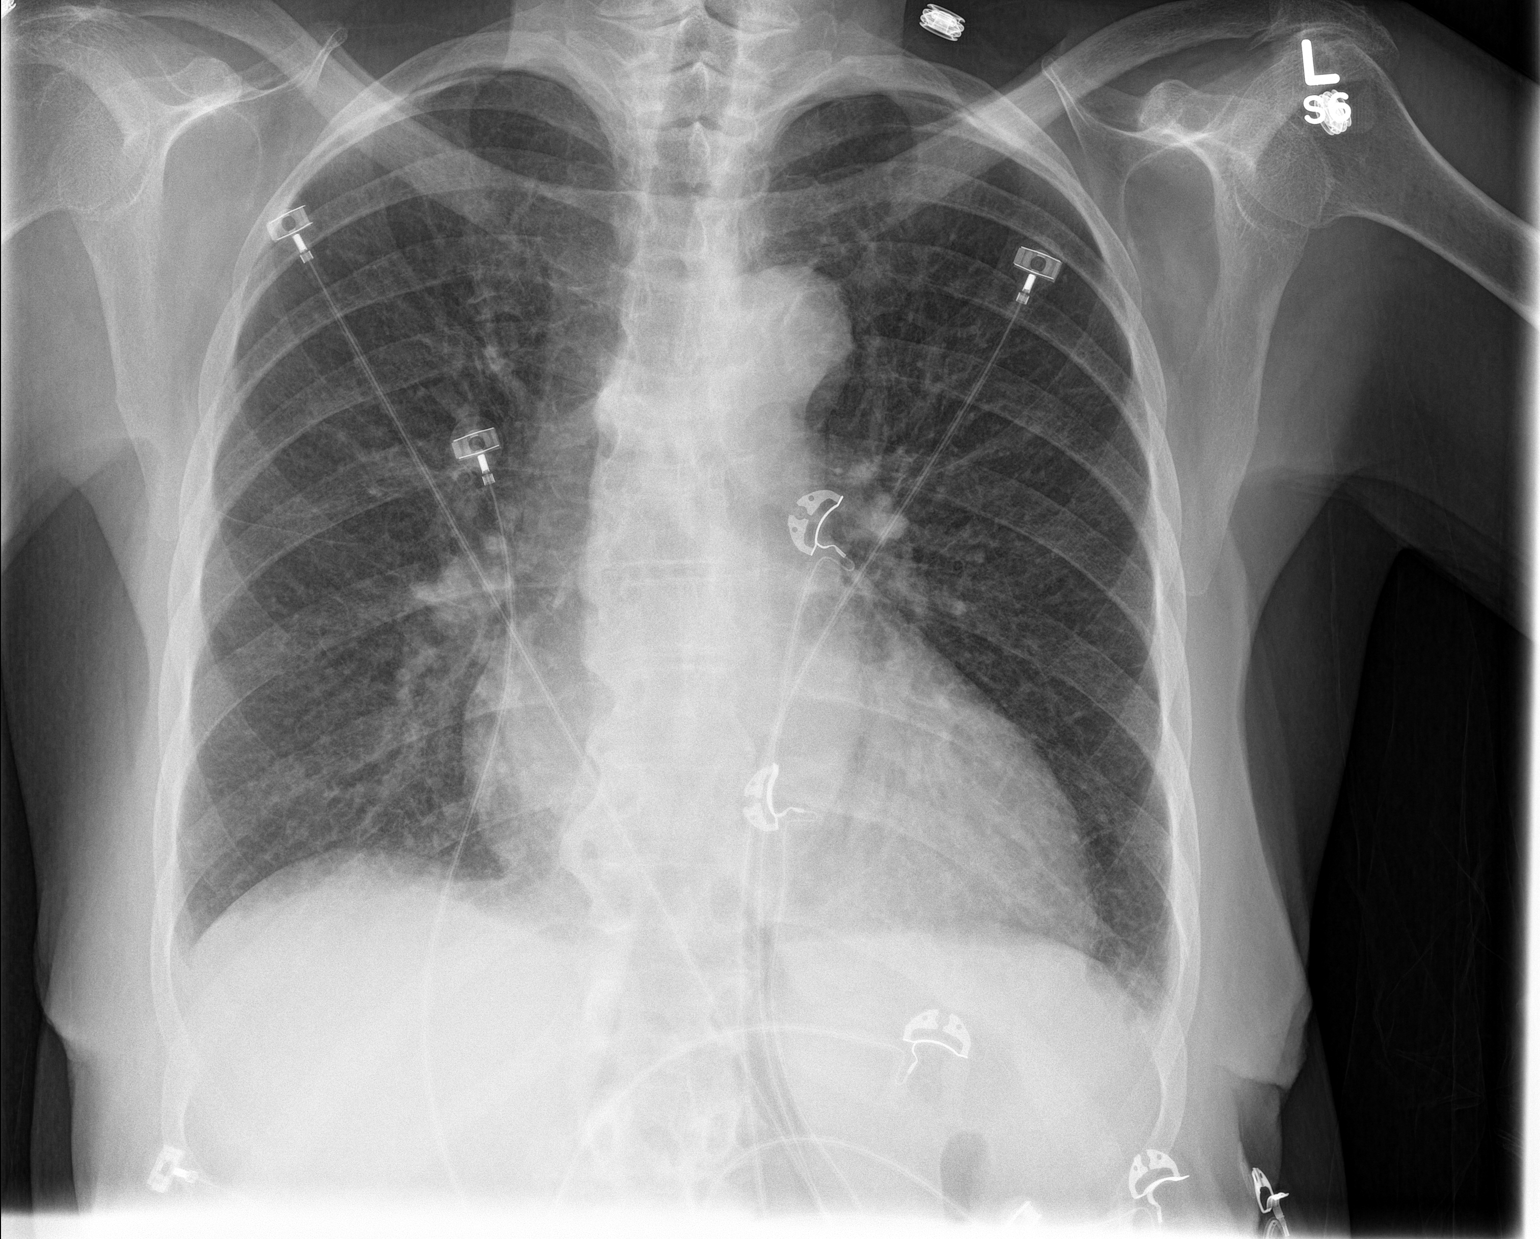

[chest lat]
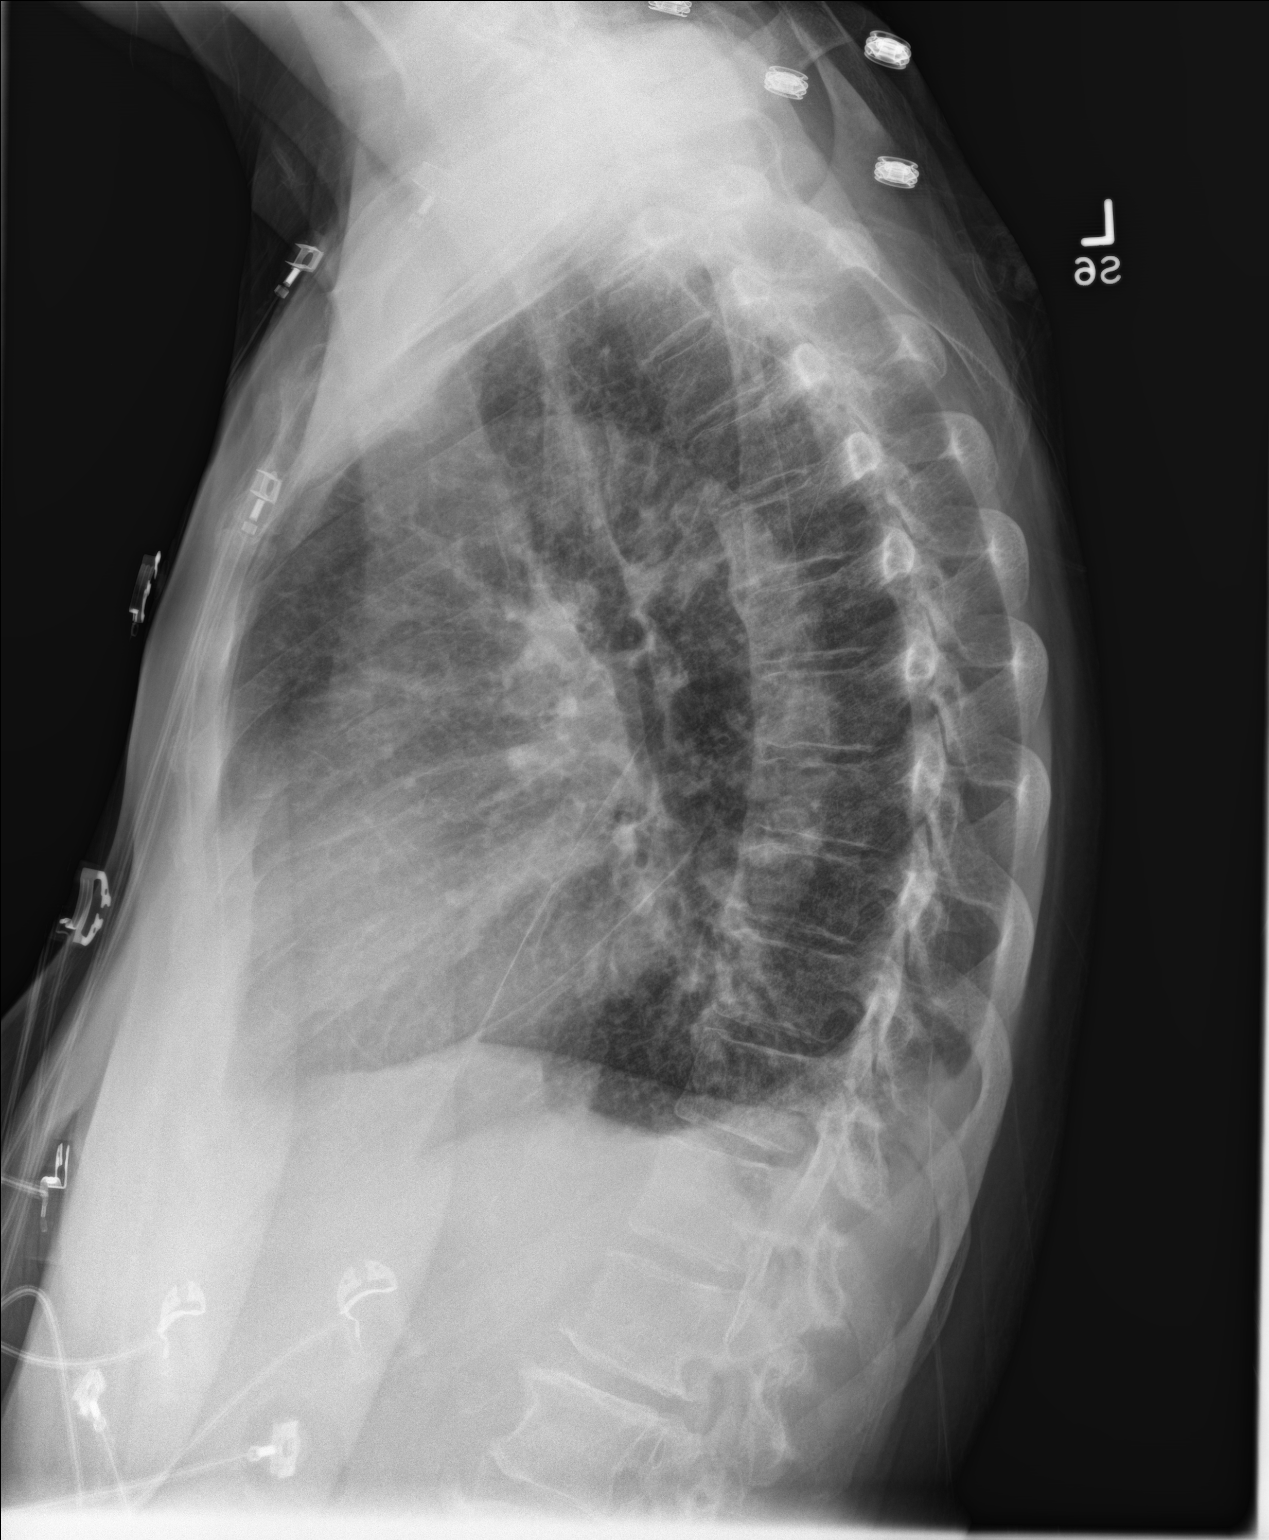

[2 of 2 positions shown; findings below may reference images not displayed]

FINDINGS: Mild cardiomegaly and aortic tortuosity which stable from prior.
Trace pleural effusions. There is no edema, consolidation, or
pneumothorax.
IMPRESSION: Trace pleural effusions.

## 2017-01-15 DIAGNOSIS — D509 Iron deficiency anemia, unspecified: Secondary | ICD-10-CM | POA: Diagnosis not present

## 2017-01-15 DIAGNOSIS — D631 Anemia in chronic kidney disease: Secondary | ICD-10-CM | POA: Diagnosis not present

## 2017-01-15 DIAGNOSIS — Z992 Dependence on renal dialysis: Secondary | ICD-10-CM | POA: Diagnosis not present

## 2017-01-15 DIAGNOSIS — N2581 Secondary hyperparathyroidism of renal origin: Secondary | ICD-10-CM | POA: Diagnosis not present

## 2017-01-15 DIAGNOSIS — N186 End stage renal disease: Secondary | ICD-10-CM | POA: Diagnosis not present

## 2017-01-15 DIAGNOSIS — R112 Nausea with vomiting, unspecified: Secondary | ICD-10-CM | POA: Diagnosis not present

## 2017-01-17 ENCOUNTER — Telehealth: Payer: Self-pay

## 2017-01-17 NOTE — Telephone Encounter (Signed)
PT said she will be by to pick up samples of the Linzess to take if she needs it. She said when she did the beverage 3 times a day it caused loose stool or diarrhea.  She will get the Linzess to have if she needs it, and she will start back on the beverage three times a day.  Samples at front of the Linzess 145 mcg #12.  She will call PCP if her dizziness and light headedness continues.

## 2017-01-17 NOTE — Telephone Encounter (Signed)
Pt called to say her weakness and light headedness is no better since she saw Dr. Oneida Alar. She had dialysis today and she is especially weak and light headed after doing dialysis. She has eaten a roast beef sandwich from [employer] this morning.  She has not had a Bm in 2 days. She said she just feels awful and what should she do. She is aware that Dr. Oneida Alar is on vacation and I will send note to Roseanne Kaufman, NP to advise!

## 2017-01-17 NOTE — Telephone Encounter (Signed)
Make sure she is drinking the resource fruit beverage three times a day.   For constipation: would she like to come pick up samples of Linzess 145 to take once each morning on an empty stomach?   Would contact PCP if persistent weakness/lightheadedness. Would keep hydrated, make sure she is eating and using the resource breeze.

## 2017-01-18 ENCOUNTER — Inpatient Hospital Stay (HOSPITAL_COMMUNITY)
Admission: EM | Admit: 2017-01-18 | Discharge: 2017-01-20 | DRG: 640 | Disposition: A | Discharge status: Home / Self Care | Payer: Medicare Other | Attending: Internal Medicine | Admitting: Internal Medicine

## 2017-01-18 ENCOUNTER — Emergency Department (HOSPITAL_COMMUNITY): Payer: Medicare Other

## 2017-01-18 ENCOUNTER — Encounter (HOSPITAL_COMMUNITY): Payer: Self-pay | Admitting: Emergency Medicine

## 2017-01-18 DIAGNOSIS — R112 Nausea with vomiting, unspecified: Secondary | ICD-10-CM | POA: Diagnosis not present

## 2017-01-18 DIAGNOSIS — G9340 Encephalopathy, unspecified: Secondary | ICD-10-CM | POA: Diagnosis not present

## 2017-01-18 DIAGNOSIS — E162 Hypoglycemia, unspecified: Secondary | ICD-10-CM | POA: Diagnosis present

## 2017-01-18 DIAGNOSIS — F329 Major depressive disorder, single episode, unspecified: Secondary | ICD-10-CM | POA: Diagnosis present

## 2017-01-18 DIAGNOSIS — Z992 Dependence on renal dialysis: Secondary | ICD-10-CM | POA: Diagnosis not present

## 2017-01-18 DIAGNOSIS — E039 Hypothyroidism, unspecified: Secondary | ICD-10-CM | POA: Diagnosis present

## 2017-01-18 DIAGNOSIS — N186 End stage renal disease: Secondary | ICD-10-CM | POA: Diagnosis not present

## 2017-01-18 DIAGNOSIS — N2581 Secondary hyperparathyroidism of renal origin: Secondary | ICD-10-CM | POA: Diagnosis not present

## 2017-01-18 DIAGNOSIS — E46 Unspecified protein-calorie malnutrition: Secondary | ICD-10-CM | POA: Diagnosis present

## 2017-01-18 DIAGNOSIS — D631 Anemia in chronic kidney disease: Secondary | ICD-10-CM | POA: Diagnosis present

## 2017-01-18 DIAGNOSIS — E876 Hypokalemia: Secondary | ICD-10-CM | POA: Diagnosis present

## 2017-01-18 DIAGNOSIS — I120 Hypertensive chronic kidney disease with stage 5 chronic kidney disease or end stage renal disease: Secondary | ICD-10-CM | POA: Diagnosis present

## 2017-01-18 DIAGNOSIS — E161 Other hypoglycemia: Secondary | ICD-10-CM | POA: Diagnosis not present

## 2017-01-18 DIAGNOSIS — R509 Fever, unspecified: Secondary | ICD-10-CM | POA: Diagnosis present

## 2017-01-18 DIAGNOSIS — R4182 Altered mental status, unspecified: Secondary | ICD-10-CM | POA: Diagnosis not present

## 2017-01-18 DIAGNOSIS — K5730 Diverticulosis of large intestine without perforation or abscess without bleeding: Secondary | ICD-10-CM | POA: Diagnosis not present

## 2017-01-18 DIAGNOSIS — E440 Moderate protein-calorie malnutrition: Secondary | ICD-10-CM | POA: Diagnosis not present

## 2017-01-18 DIAGNOSIS — Z79899 Other long term (current) drug therapy: Secondary | ICD-10-CM

## 2017-01-18 DIAGNOSIS — N189 Chronic kidney disease, unspecified: Secondary | ICD-10-CM

## 2017-01-18 DIAGNOSIS — Z8673 Personal history of transient ischemic attack (TIA), and cerebral infarction without residual deficits: Secondary | ICD-10-CM

## 2017-01-18 DIAGNOSIS — G9341 Metabolic encephalopathy: Secondary | ICD-10-CM | POA: Diagnosis present

## 2017-01-18 DIAGNOSIS — Z8542 Personal history of malignant neoplasm of other parts of uterus: Secondary | ICD-10-CM

## 2017-01-18 DIAGNOSIS — I10 Essential (primary) hypertension: Secondary | ICD-10-CM | POA: Diagnosis not present

## 2017-01-18 DIAGNOSIS — K219 Gastro-esophageal reflux disease without esophagitis: Secondary | ICD-10-CM | POA: Diagnosis present

## 2017-01-18 DIAGNOSIS — D509 Iron deficiency anemia, unspecified: Secondary | ICD-10-CM | POA: Diagnosis not present

## 2017-01-18 DIAGNOSIS — J9811 Atelectasis: Secondary | ICD-10-CM | POA: Diagnosis not present

## 2017-01-18 LAB — COMPREHENSIVE METABOLIC PANEL
ALT: 9 U/L — AB (ref 14–54)
AST: 17 U/L (ref 15–41)
Albumin: 3.4 g/dL — ABNORMAL LOW (ref 3.5–5.0)
Alkaline Phosphatase: 54 U/L (ref 38–126)
Anion gap: 15 (ref 5–15)
BUN: 20 mg/dL (ref 6–20)
CALCIUM: 8.5 mg/dL — AB (ref 8.9–10.3)
CHLORIDE: 98 mmol/L — AB (ref 101–111)
CO2: 23 mmol/L (ref 22–32)
CREATININE: 4.7 mg/dL — AB (ref 0.44–1.00)
GFR calc Af Amer: 10 mL/min — ABNORMAL LOW (ref >60)
GFR calc non Af Amer: 9 mL/min — ABNORMAL LOW (ref >60)
Glucose, Bld: 81 mg/dL (ref 65–99)
Potassium: 2.9 mmol/L — ABNORMAL LOW (ref 3.5–5.1)
Sodium: 136 mmol/L (ref 135–145)
Total Bilirubin: 0.5 mg/dL (ref 0.3–1.2)
Total Protein: 7.3 g/dL (ref 6.5–8.1)

## 2017-01-18 LAB — GLUCOSE, CAPILLARY: Glucose-Capillary: 94 mg/dL (ref 65–99)

## 2017-01-18 LAB — CBC WITH DIFFERENTIAL/PLATELET
BASOS ABS: 0 10*3/uL (ref 0.0–0.1)
BASOS PCT: 0 %
EOS ABS: 0 10*3/uL (ref 0.0–0.7)
Eosinophils Relative: 0 %
HCT: 34.5 % — ABNORMAL LOW (ref 36.0–46.0)
HEMOGLOBIN: 11.1 g/dL — AB (ref 12.0–15.0)
LYMPHS ABS: 1 10*3/uL (ref 0.7–4.0)
Lymphocytes Relative: 15 %
MCH: 25.5 pg — ABNORMAL LOW (ref 26.0–34.0)
MCHC: 32.2 g/dL (ref 30.0–36.0)
MCV: 79.1 fL (ref 78.0–100.0)
Monocytes Absolute: 0.6 10*3/uL (ref 0.1–1.0)
Monocytes Relative: 9 %
NEUTROS PCT: 76 %
Neutro Abs: 5.2 10*3/uL (ref 1.7–7.7)
Platelets: 202 10*3/uL (ref 150–400)
RBC: 4.36 MIL/uL (ref 3.87–5.11)
RDW: 18.4 % — ABNORMAL HIGH (ref 11.5–15.5)
WBC: 6.9 10*3/uL (ref 4.0–10.5)

## 2017-01-18 LAB — I-STAT CHEM 8, ED
BUN: 19 mg/dL (ref 6–20)
CALCIUM ION: 0.99 mmol/L — AB (ref 1.15–1.40)
CHLORIDE: 100 mmol/L — AB (ref 101–111)
Creatinine, Ser: 4.8 mg/dL — ABNORMAL HIGH (ref 0.44–1.00)
GLUCOSE: 78 mg/dL (ref 65–99)
HCT: 37 % (ref 36.0–46.0)
Hemoglobin: 12.6 g/dL (ref 12.0–15.0)
POTASSIUM: 2.9 mmol/L — AB (ref 3.5–5.1)
Sodium: 138 mmol/L (ref 135–145)
TCO2: 23 mmol/L (ref 0–100)

## 2017-01-18 LAB — I-STAT CG4 LACTIC ACID, ED
LACTIC ACID, VENOUS: 3.85 mmol/L — AB (ref 0.5–1.9)
Lactic Acid, Venous: 4.45 mmol/L (ref 0.5–1.9)

## 2017-01-18 LAB — CBG MONITORING, ED
GLUCOSE-CAPILLARY: 105 mg/dL — AB (ref 65–99)
GLUCOSE-CAPILLARY: 66 mg/dL (ref 65–99)
GLUCOSE-CAPILLARY: 94 mg/dL (ref 65–99)
Glucose-Capillary: 86 mg/dL (ref 65–99)
Glucose-Capillary: 78 mg/dL (ref 65–99)
Glucose-Capillary: 57 mg/dL — ABNORMAL LOW (ref 65–99)

## 2017-01-18 MED ORDER — LORAZEPAM 1 MG PO TABS
1.0000 mg | ORAL_TABLET | Freq: Once | ORAL | Status: DC
  Filled 2017-01-18: qty 1

## 2017-01-18 MED ORDER — TRAZODONE HCL 50 MG PO TABS: 50.0000 mg | ORAL_TABLET | Freq: Every evening | ORAL | Status: DC | PRN

## 2017-01-18 MED ORDER — DEXTROSE 5 % IV SOLN
500.0000 mg | Freq: Two times a day (BID) | INTRAVENOUS | Status: DC
  Administered 2017-01-19: 500 mg via INTRAVENOUS
  Filled 2017-01-18: qty 0.5

## 2017-01-18 MED ORDER — LORAZEPAM 2 MG/ML IJ SOLN
1.0000 mg | Freq: Once | INTRAMUSCULAR | Status: AC
  Administered 2017-01-18: 1 mg via INTRAVENOUS
  Filled 2017-01-18: qty 1

## 2017-01-18 MED ORDER — NIFEDIPINE ER OSMOTIC RELEASE 30 MG PO TB24: 60.0000 mg | ORAL_TABLET | Freq: Every day | ORAL | Status: DC

## 2017-01-18 MED ORDER — ALPRAZOLAM 0.5 MG PO TABS
0.5000 mg | ORAL_TABLET | Freq: Two times a day (BID) | ORAL | Status: DC
  Administered 2017-01-19 – 2017-01-20 (×3): 0.5 mg via ORAL
  Filled 2017-01-18 (×4): qty 1

## 2017-01-18 MED ORDER — DIAZEPAM 2 MG PO TABS: 2.0000 mg | ORAL_TABLET | Freq: Four times a day (QID) | ORAL | Status: DC | PRN

## 2017-01-18 MED ORDER — DEXTROSE 50 % IV SOLN
1.0000 | Freq: Once | INTRAVENOUS | Status: AC
  Administered 2017-01-18: 50 mL via INTRAVENOUS

## 2017-01-18 MED ORDER — HEPARIN SODIUM (PORCINE) 5000 UNIT/ML IJ SOLN
5000.0000 [IU] | Freq: Three times a day (TID) | INTRAMUSCULAR | Status: DC
  Filled 2017-01-18 (×2): qty 1

## 2017-01-18 MED ORDER — DEXTROSE-NACL 5-0.45 % IV SOLN
INTRAVENOUS | Status: DC
  Administered 2017-01-18: 18:00:00 via INTRAVENOUS

## 2017-01-18 MED ORDER — DEXTROSE 50 % IV SOLN
50.0000 mL | Freq: Once | INTRAVENOUS | Status: AC
  Administered 2017-01-18: 50 mL via INTRAVENOUS

## 2017-01-18 MED ORDER — ALLOPURINOL 300 MG PO TABS
150.0000 mg | ORAL_TABLET | Freq: Every day | ORAL | Status: DC
  Administered 2017-01-19 – 2017-01-20 (×2): 150 mg via ORAL
  Filled 2017-01-18 (×2): qty 1

## 2017-01-18 MED ORDER — SODIUM CHLORIDE 0.9% FLUSH
3.0000 mL | Freq: Two times a day (BID) | INTRAVENOUS | Status: DC
  Administered 2017-01-18 – 2017-01-20 (×5): 3 mL via INTRAVENOUS

## 2017-01-18 MED ORDER — SENNA 8.6 MG PO TABS
1.0000 | ORAL_TABLET | Freq: Two times a day (BID) | ORAL | Status: DC
  Administered 2017-01-19 – 2017-01-20 (×2): 8.6 mg via ORAL
  Filled 2017-01-18 (×3): qty 1

## 2017-01-18 MED ORDER — SODIUM CHLORIDE 0.9 % IV SOLN: 250.0000 mL | INTRAVENOUS | Status: DC | PRN

## 2017-01-18 MED ORDER — PANTOPRAZOLE SODIUM 40 MG PO TBEC
40.0000 mg | DELAYED_RELEASE_TABLET | Freq: Every day | ORAL | Status: DC
  Filled 2017-01-18: qty 1

## 2017-01-18 MED ORDER — DEXTROSE 10 % IV SOLN
INTRAVENOUS | Status: DC
  Administered 2017-01-18: via INTRAVENOUS

## 2017-01-18 MED ORDER — LEVOFLOXACIN IN D5W 750 MG/150ML IV SOLN
750.0000 mg | Freq: Once | INTRAVENOUS | Status: AC
  Administered 2017-01-18: 750 mg via INTRAVENOUS
  Filled 2017-01-18: qty 150

## 2017-01-18 MED ORDER — ACETAMINOPHEN 650 MG RE SUPP: 650.0000 mg | Freq: Four times a day (QID) | RECTAL | Status: DC | PRN

## 2017-01-18 MED ORDER — DEXTROSE 50 % IV SOLN
INTRAVENOUS | Status: AC
  Administered 2017-01-18: 50 mL via INTRAVENOUS
  Filled 2017-01-18: qty 50

## 2017-01-18 MED ORDER — SODIUM CHLORIDE 0.9% FLUSH: 3.0000 mL | INTRAVENOUS | Status: DC | PRN

## 2017-01-18 MED ORDER — POLYETHYLENE GLYCOL 3350 17 G PO PACK: 17.0000 g | PACK | Freq: Every day | ORAL | Status: DC | PRN

## 2017-01-18 MED ORDER — ALBUTEROL SULFATE (2.5 MG/3ML) 0.083% IN NEBU: 2.5000 mg | INHALATION_SOLUTION | RESPIRATORY_TRACT | Status: DC | PRN

## 2017-01-18 MED ORDER — LIDOCAINE-PRILOCAINE 2.5-2.5 % EX CREA: 1.0000 "application " | TOPICAL_CREAM | CUTANEOUS | Status: DC

## 2017-01-18 MED ORDER — NEPRO/CARBSTEADY PO LIQD: 237.0000 mL | Freq: Three times a day (TID) | ORAL | Status: DC

## 2017-01-18 MED ORDER — ONDANSETRON HCL 4 MG/2ML IJ SOLN: 4.0000 mg | Freq: Four times a day (QID) | INTRAMUSCULAR | Status: DC | PRN

## 2017-01-18 MED ORDER — DEXTROSE 5 % IV SOLN
2.0000 g | Freq: Once | INTRAVENOUS | Status: AC
  Administered 2017-01-18: 2 g via INTRAVENOUS
  Filled 2017-01-18: qty 2

## 2017-01-18 MED ORDER — LABETALOL HCL 200 MG PO TABS
600.0000 mg | ORAL_TABLET | Freq: Two times a day (BID) | ORAL | Status: DC
  Filled 2017-01-18: qty 3

## 2017-01-18 MED ORDER — ALENDRONATE SODIUM 70 MG PO TABS: 70.0000 mg | ORAL_TABLET | ORAL | Status: DC

## 2017-01-18 MED ORDER — ACETAMINOPHEN 325 MG PO TABS: 650.0000 mg | ORAL_TABLET | Freq: Four times a day (QID) | ORAL | Status: DC | PRN

## 2017-01-18 MED ORDER — SODIUM CHLORIDE 0.9 % IV BOLUS (SEPSIS)
500.0000 mL | Freq: Once | INTRAVENOUS | Status: AC
  Administered 2017-01-18: 500 mL via INTRAVENOUS

## 2017-01-18 MED ORDER — HALOPERIDOL LACTATE 5 MG/ML IJ SOLN: 5.0000 mg | Freq: Four times a day (QID) | INTRAMUSCULAR | Status: DC | PRN

## 2017-01-18 MED ORDER — CINACALCET HCL 30 MG PO TABS
30.0000 mg | ORAL_TABLET | Freq: Every day | ORAL | Status: DC
  Administered 2017-01-19 – 2017-01-20 (×2): 30 mg via ORAL
  Filled 2017-01-18 (×2): qty 1

## 2017-01-18 MED ORDER — LEVOTHYROXINE SODIUM 50 MCG PO TABS
50.0000 ug | ORAL_TABLET | Freq: Every day | ORAL | Status: DC
  Administered 2017-01-19 – 2017-01-20 (×2): 50 ug via ORAL
  Filled 2017-01-18 (×2): qty 1

## 2017-01-18 MED ORDER — ONDANSETRON HCL 4 MG PO TABS: 4.0000 mg | ORAL_TABLET | Freq: Four times a day (QID) | ORAL | Status: DC | PRN

## 2017-01-18 MED ORDER — SEVELAMER CARBONATE 800 MG PO TABS
2400.0000 mg | ORAL_TABLET | Freq: Three times a day (TID) | ORAL | Status: DC
  Filled 2017-01-18: qty 3

## 2017-01-18 MED ORDER — VANCOMYCIN HCL IN DEXTROSE 1-5 GM/200ML-% IV SOLN
1000.0000 mg | Freq: Once | INTRAVENOUS | Status: AC
  Administered 2017-01-18: 1000 mg via INTRAVENOUS
  Filled 2017-01-18: qty 200

## 2017-01-18 MED ORDER — MECLIZINE HCL 12.5 MG PO TABS: 25.0000 mg | ORAL_TABLET | Freq: Four times a day (QID) | ORAL | Status: DC | PRN

## 2017-01-18 MED ORDER — BISACODYL 5 MG PO TBEC: 5.0000 mg | DELAYED_RELEASE_TABLET | Freq: Every day | ORAL | Status: DC | PRN

## 2017-01-18 NOTE — ED Provider Notes (Signed)
[facility] DEPT Provider Note   CSN: [id number] Arrival date & time: 01/18/17  1412     History   Chief Complaint Chief Complaint  Patient presents with  . Altered Mental Status    HPI Sonya [name] is a 66 y.o. female. Level 5 caveat due to altered mental status. HPI Patient presents with altered mental status. Reportedly stopped her dialysis early. Confused when she got home. Patient cannot really provide much history. Patient's family member states she was okay when she went to dialysis this morning. Reportedly had sugars of 60 for EMS.   Past Medical History:  Diagnosis Date  . Anemia   . CKD (chronic kidney disease)   . Diverticulitis   . Dysphagia   . ESRD (end stage renal disease) ([place])    On HD  . Fracture of femoral neck, left, closed ([place]) 05/30/2015  . Gastritis 06/2013 & 05/2015  . Gastroesophageal reflux disease with stricture 05/2015  . GERD (gastroesophageal reflux disease)   . HTN (hypertension)   . Hyperparathyroidism due to renal insufficiency ([place])   . Hypertensive urgency 06/2013 & 04/2015  . Hypothyroidism   . ICH (intracerebral hemorrhage) ([place]) 10/2014  . 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 Protein calorie malnutrition ([place])   . Stroke ([place])   . Uterine cancer ([place]) 2012  . Vertical diplopia   . Vertigo     Patient Active Problem List   Diagnosis Date Noted  . 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 Colitis, acute 11/20/2016  . ESRD (end stage renal disease) ([place])   . Weakness   . Abnormal weight loss 12/01/2015  . Stomach growling 12/01/2015  . Pulmonary edema 08/19/2015  . CAP (community acquired pneumonia) 08/19/2015  . Hypothyroidism, adult 06/14/2015  . HA (headache) 06/13/2015  . HTN (hypertension), malignant 06/13/2015  . Elective surgery   . Fracture of femoral neck, left, closed ([place]) 05/30/2015  . Hip fracture ([place]) 05/30/2015  . Closed fracture of neck of left femur ([place])   . Emesis   . Nausea with vomiting 05/13/2015  . Nausea  and vomiting 04/14/2015  . UTI (lower urinary tract infection) 04/14/2015  . Orthostatic hypotension 11/27/2014  . Accelerated hypertension 11/25/2014  . Dizziness   . Uncontrolled hypertension   . Acute encephalopathy   . Confusion   . Protein-calorie malnutrition, severe ([place]) 10/15/2014  . Dysphagia 10/15/2014  . Hypertensive emergency 10/14/2014  . Hypertensive urgency, malignant 10/14/2014  . Intracerebral bleed ([place]) 10/14/2014  . End stage renal disease ([place]) 08/31/2013  . Symptomatic anemia 08/18/2013  . Hyperparathyroidism due to renal insufficiency ([place]) 07/09/2013  . Unspecified constipation 06/21/2013  . Gastritis 06/21/2013  . Dizzy 06/20/2013  . Chronic kidney disease 06/20/2013  . Anemia in chronic kidney disease 06/08/2013  . Regurgitation 06/08/2013  . Hypokalemia 06/08/2013  . Gout 06/08/2013  . Other dysphagia 06/07/2013  . Hypertensive urgency 06/07/2013  . Borborygmi 04/08/2011  . Essential hypertension 04/08/2011  . Colon cancer screening 04/08/2011    Past Surgical History:  Procedure Laterality Date  . ABDOMINAL HYSTERECTOMY    . APPENDECTOMY    . BASCILIC VEIN TRANSPOSITION Right 09/17/2013   Procedure: BRACHIAL VEIN TRANSPOSITION;  Surgeon: Conrad [place], MD;  Location: [facility];  Service: Vascular;  Laterality: Right;  . BASCILIC VEIN TRANSPOSITION Right 10/29/2013   Procedure: RIGHT SECOND STAGE BRACHIAL VEIN TRANSPOSITION;  Surgeon: Conrad Reform, MD;  Location: [facility];  Service: Vascular;  Laterality: Right;  . CESAREAN SECTION    . CHOLECYSTECTOMY    .  COLONOSCOPY  2000   TICS, IH  . COLONOSCOPY  2003 NUR BRBPR D50 V6   DC/[place] TICS, IH  . COLONOSCOPY N/A 09/04/2013   SLF: Small ulcer in the descending colon, one colon polyp (tubular adenoma), large internal hemorrhoids, moderate diverticulosis. next tcs 10 years.  . ESOPHAGOGASTRODUODENOSCOPY N/A 05/23/2015   HQP:RFFM non-erosive gastritis/stricture at the gastroesophageal junction  .  ESOPHAGOGASTRODUODENOSCOPY (EGD) WITH ESOPHAGEAL DILATION N/A 06/08/2013   Dr. Fields:Stricture at the gastroesophageal junction/MILD NON-erosive gastritis (inflammation) was found in the gastric antrum; multiple biopsies/NO SOURCE FOR ANEMIA IDETIFIED-MOST LIKELY DUE TO ANEMIA OF CHRONIC DISEASE  . FLEXIBLE SIGMOIDOSCOPY N/A 01/02/2016   Dr. Oneida Alar: external hemorrhoids, diverticulosis, internal hemorrhoids, proximal rectum normal  . GIVENS CAPSULE STUDY N/A 12/03/2013   SLF: occasional gastric erosion. small bowel normal  . HIP PINNING,CANNULATED Left 05/31/2015   Procedure: CANNULATED HIP PINNING;  Surgeon: Leandrew Koyanagi, MD;  Location: [facility];  Service: Orthopedics;  Laterality: Left;  . INSERTION OF DIALYSIS CATHETER Left 09/17/2013   Procedure: INSERTION OF DIALYSIS CATHETER;  Surgeon: Conrad [place], MD;  Location: [facility];  Service: Vascular;  Laterality: Left;  . LAPAROSCOPIC TOTAL HYSTERECTOMY    . NASAL SEPTUM SURGERY    . SAVORY DILATION N/A 05/23/2015   Procedure: SAVORY DILATION;  Surgeon: Danie Binder, MD;  [facility];  Service: Endoscopy;  Laterality: N/A;    OB History    Gravida Para Term Preterm AB Living   1 1 1          SAB TAB Ectopic Multiple Live Births                   Home Medications    Prior to Admission medications   Medication Sig Start Date End Date Taking? Authorizing Provider  alendronate (FOSAMAX) 70 MG tablet Take 70 mg by mouth once a week. Take with a full glass of water on an empty stomach.    [provider]  allopurinol (ZYLOPRIM) 300 MG tablet Take 0.5 tablets (150 mg total) by mouth daily. Dose was decreased secondary to renal failure. Patient taking differently: Take 300 mg by mouth daily. Dose was decreased secondary to renal failure. 06/16/15   Rexene Alberts, MD  ALPRAZolam Duanne Moron) 0.5 MG tablet Take 1 tablet (0.5 mg total) by mouth 2 (two) times daily. 12/29/16   Fields, Marga Melnick, MD  bisacodyl (DULCOLAX) 5 MG EC tablet Take 5  mg by mouth daily as needed for moderate constipation.    [provider]  cinacalcet (SENSIPAR) 30 MG tablet Take 30 mg by mouth daily.    [provider]  ciprofloxacin (CIPRO) 500 MG tablet Take 1 tablet (500 mg total) by mouth daily. Patient not taking: Reported on 12/07/2016 11/21/16   Kathie Dike, MD  diazepam (VALIUM) 2 MG tablet Take 2 mg by mouth every 6 (six) hours as needed for anxiety.    [provider]  doxycycline (VIBRAMYCIN) 100 MG capsule Take 1 capsule by mouth 2 (two) times daily. 12/03/16   [provider]  labetalol (NORMODYNE) 200 MG tablet Take 400 mg by mouth 2 (two) times daily.     [provider]  levothyroxine (SYNTHROID, LEVOTHROID) 50 MCG tablet Take 1 tablet (50 mcg total) by mouth daily. 04/17/15   Samuella Cota, MD  lidocaine-prilocaine (EMLA) cream Apply 1 application topically every Tuesday, Thursday, and Saturday at 6 PM.     [provider]  meclizine (ANTIVERT) 25 MG tablet Take  1 tablet by mouth every 6 (six) hours as needed for dizziness. dizziness 11/22/14   [provider]  metoCLOPramide (REGLAN) 5 MG tablet 1 po before breakfast. 01/02/16   Fields, Marga Melnick, MD  metroNIDAZOLE (FLAGYL) 500 MG tablet Take 1 tablet (500 mg total) by mouth every 8 (eight) hours. Patient not taking: Reported on 12/07/2016 11/20/16   Kathie Dike, MD  NIFEdipine (PROCARDIA XL/ADALAT-CC) 90 MG 24 hr tablet Take 90 mg by mouth daily. 04/10/15   [provider]  omeprazole (PRILOSEC) 20 MG capsule TAKE ONE CAPSULE BY MOUTH ONCE DAILY 05/18/16   Annitta Needs, NP  sevelamer carbonate (RENVELA) 800 MG tablet Take 2,400 mg by mouth 3 (three) times daily with meals.     [provider]    Family History Family History  Problem Relation Age of Onset  . Cancer Mother   . Diabetes Mother   . Hypertension Mother   . Diabetes Father   . Hypertension Father   . Hypertension Sister   . Colon cancer Neg Hx      Social History Social History  Substance Use Topics  . Smoking status: Never Smoker  . Smokeless tobacco: Never Used  . Alcohol use No     Allergies   Cephalexin   Review of Systems Review of Systems  Unable to perform ROS: Mental status change     Physical Exam Updated Vital Signs BP 121/82   Pulse 94   Temp (!) 100.4 F (38 C) (Rectal)   Resp (!) 22   Ht 5\' 5"  (1.651 m)   Wt 60.8 kg (134 lb)   SpO2 100%   BMI 22.30 kg/m   Physical Exam  Constitutional: She appears well-developed.  HENT:  Head: Atraumatic.  Eyes:  Right pupil around 3 mm and left pupil around 4.  Neck: Neck supple.  Cardiovascular:  Mild tachycardia  Pulmonary/Chest: Effort normal. She has no wheezes.  Abdominal: There is no tenderness.  Musculoskeletal: She exhibits no edema.  Dressed dialysis graft to right upper extremity. Good thrill.  Neurological:  Patient has decreased mental status. Really not answering questions will follow commands. Will occasionally nod yes or no to questions. Appears to be moving all extremities.  Skin: Skin is warm. Capillary refill takes less than 2 seconds.     ED Treatments / Results  Labs (all labs ordered are listed, but only abnormal results are displayed) Labs Reviewed  I-STAT CG4 LACTIC ACID, ED - Abnormal; Notable for the following:       Result Value   Lactic Acid, Venous 3.85 (*)    All other components within normal limits  I-STAT CHEM 8, ED - Abnormal; Notable for the following:    Potassium 2.9 (*)    Chloride 100 (*)    Creatinine, Ser 4.80 (*)    Calcium, Ion 0.99 (*)    All other components within normal limits  CULTURE, BLOOD (ROUTINE X 2)  CULTURE, BLOOD (ROUTINE X 2)  COMPREHENSIVE METABOLIC PANEL  CBC WITH DIFFERENTIAL/PLATELET  URINALYSIS, ROUTINE W REFLEX MICROSCOPIC  CBG MONITORING, ED    EKG  EKG Interpretation  Date/Time:  Tuesday January 18 2017 14:25:22 EDT Ventricular Rate:  105 PR Interval:    QRS  Duration: 80 QT Interval:  403 QTC Calculation: 533 R Axis:   81 Text Interpretation:  Sinus tachycardia Right atrial enlargement Borderline right axis deviation Consider left ventricular hypertrophy Nonspecific T abnormalities, lateral leads Prolonged QT interval Confirmed by Alvino Chapel  MD, Caroline Matters (  [id number]) on 01/18/2017 2:57:28 PM       Radiology Dg Chest Port 1 View  Result Date: 01/18/2017 CLINICAL DATA:  Altered mental status. EXAM: PORTABLE CHEST 1 VIEW COMPARISON:  09/11/2015 FINDINGS: The heart size and mediastinal contours are within normal limits. Both lungs are clear. The visualized skeletal structures are unremarkable. IMPRESSION: No active disease. Electronically Signed   By: Kathreen Devoid   On: 01/18/2017 14:44    Procedures Procedures (including critical care time)  Medications Ordered in ED Medications  sodium chloride 0.9 % bolus 500 mL (500 mLs Intravenous New Bag/Given 01/18/17 1445)  vancomycin (VANCOCIN) IVPB 1000 mg/200 mL premix (not administered)  aztreonam (AZACTAM) 2 g in dextrose 5 % 50 mL IVPB (not administered)  levofloxacin (LEVAQUIN) IVPB 750 mg (not administered)  sodium chloride 0.9 % bolus 500 mL (not administered)     Initial Impression / Assessment and Plan / ED Course  I have reviewed the triage vital signs and the nursing notes.  Pertinent labs & imaging results that were available during my care of the patient were reviewed by me and considered in my medical decision making (see chart for details).     Patient with altered mental status. Low-grade temperature with rectal temperature 100.4. Chest x-ray reassuring. Lactic acid elevated at 3.5. Not hypotensive. Is however altered. Head CT to be done. Did have colitis 2 months ago. Will require admission to internal medicine. Care turned over to Dr Dayna Barker  CRITICAL CARE Performed by: Mackie Pai. Total critical care time: 30 minutes Critical care time was exclusive of separately billable  procedures and treating other patients. Critical care was necessary to treat or prevent imminent or life-threatening deterioration. Critical care was time spent personally by me on the following activities: development of treatment plan with patient and/or surrogate as well as nursing, discussions with consultants, evaluation of patient's response to treatment, examination of patient, obtaining history from patient or surrogate, ordering and performing treatments and interventions, ordering and review of laboratory studies, ordering and review of radiographic studies, pulse oximetry and re-evaluation of patient's condition.]  Final Clinical Impressions(s) / ED Diagnoses   Final diagnoses:  Altered mental status, unspecified altered mental status type  End stage renal disease on dialysis [facility])    New Prescriptions New Prescriptions   No medications on file     Davonna Belling, MD 01/18/17 1540

## 2017-01-18 NOTE — Progress Notes (Signed)
Pharmacy Antibiotic Note  NIKELLE MALATESTA is a 66 y.o. female admitted on 01/18/2017 with sepsis.  Pharmacy has been consulted for vancomycin, levaquin and aztreonam dosing.  Plan: Vancomycin 1 gm IV X 1 Aztreonam 2 gm IV x 1 Levaquin 750 mg IV x 1 Will f/u admission plans/orders.  Height: 5\' 5"  (165.1 cm) Weight: 134 lb (60.8 kg) IBW/kg (Calculated) : 57  Temp (24hrs), Avg:100.4 F (38 C), Min:100.4 F (38 C), Max:100.4 F (38 C)  No results for input(s): WBC, CREATININE, LATICACIDVEN, VANCOTROUGH, VANCOPEAK, VANCORANDOM, GENTTROUGH, GENTPEAK, GENTRANDOM, TOBRATROUGH, TOBRAPEAK, TOBRARND, AMIKACINPEAK, AMIKACINTROU, AMIKACIN in the last 168 hours.  CrCl cannot be calculated (Patient's most recent lab result is older than the maximum 21 days allowed.).    Allergies  Allergen Reactions  . Cephalexin Itching    Thank you for allowing pharmacy to be a part of this patient's care.  Beverlee Nims 01/18/2017 2:46 PM

## 2017-01-18 NOTE — ED Triage Notes (Signed)
Pt AMS per famly, had dialysis today and finished early, unable to state why. Upon EMS arrival CBG 61, 1/2 amp D50 given and rose to 97. Pt states "Help me".

## 2017-01-18 NOTE — ED Notes (Signed)
Notified hospitalist pt bg is 31.  hospitalist in room attempting to feed pt.  Pt turning her head and stating she does not want it.  Pt ate 1 or 2 bites of jello and drank a sip of apple juice  Family in the room and encouraged to try and get pt to eat and drink

## 2017-01-18 NOTE — ED Notes (Signed)
Brought pt snack (graham crackers and peanut butter) and encouraged pt to eat but pt refuses to eat. RN [facility] made aware

## 2017-01-18 NOTE — H&P (Signed)
Patient Demographics:    Sonya [name], is a 66 y.o. female  MRN: [id number]   DOB - Sep 08, 1950  Admit Date - 01/18/2017  Outpatient Primary MD for the patient is Elfredia Nevins, MD   Assessment & Plan:    Principal Problem:   Hypoglycemia Active Problems:   Essential hypertension   Anemia in chronic kidney disease   ESRD (end stage renal disease) (HCC)   Encephalopathy acute   1)Persistent hypoglycemia- according to family members patient has refused to eat all day, blood sugars in the 50s and 60s despite repeated doses of D50, no oral hypoglycemics or insulin administered previously. Confusional episodes appear to be associated with hypoglycemia, despite encouragement patient is not eating. Start D10 solution at 50 ml/hr, be judicious with IV fluids as patient is anuric and hemodialysis dependent.   2)Low Grade fever- need to rule out possible sepsis, no leukocytosis, cannot exclude occult infection or bacteremia.  CT chest, abdomen and pelvis without acute findings. CT head also without acute findings. Unable to obtain UA as patient is anuric. ED provider gave IV Aztreonam,  Levaquin and vancomycin we'll continue these for now pending blood culture results. Pharmacy to help manage antibiotics dosage and frequency as patient is hemodialysis dependent  3)ESRD- patient did not complete a hemodialysis session on 01/18/2017, nephrology consult been requested from Dr. Kristian Covey. Hypokalemia to be addressed with HD as per nephrologist. Usually gets HD on Tuesdays Thursdays and Saturdays  4)HTN- stable, continue labetalol and nifedipine  5)Acute Encephalopathy- I believe patient's confusional issues are most likely due to #1 above, #2 may be playing a role as well. Anticipated mental status will improve with correction of  hypoglycemia  With History of - Reviewed by me  Past Medical History:  Diagnosis Date  . Anemia   . CKD (chronic kidney disease)   . Diverticulitis   . Dysphagia   . ESRD (end stage renal disease) (HCC)    On HD  . Fracture of femoral neck, left, closed (HCC) 05/30/2015  . Gastritis 06/2013 & 05/2015  . Gastroesophageal reflux disease with stricture 05/2015  . GERD (gastroesophageal reflux disease)   . HTN (hypertension)   . Hyperparathyroidism due to renal insufficiency (HCC)   . Hypertensive urgency 06/2013 & 04/2015  . Hypothyroidism   . ICH (intracerebral hemorrhage) (HCC) 10/2014  . Meningocele (HCC)   . Protein calorie malnutrition (HCC)   . Stroke (HCC)   . Uterine cancer (HCC) 2012  . Vertical diplopia   . Vertigo       Past Surgical History:  Procedure Laterality Date  . ABDOMINAL HYSTERECTOMY    . APPENDECTOMY    . BASCILIC VEIN TRANSPOSITION Right 09/17/2013   Procedure: BRACHIAL VEIN TRANSPOSITION;  Surgeon: Fransisco Hertz, MD;  Location: [facility] OR;  Service: Vascular;  Laterality: Right;  . BASCILIC VEIN TRANSPOSITION Right 10/29/2013   Procedure: RIGHT SECOND STAGE BRACHIAL VEIN TRANSPOSITION;  Surgeon: Fransisco Hertz, MD;  Location: [facility] OR;  Service: Vascular;  Laterality: Right;  . CESAREAN SECTION    . CHOLECYSTECTOMY    . COLONOSCOPY  2000   TICS, [facility]  . COLONOSCOPY  2003 NUR BRBPR D50 V6   DC/[facility] TICS, [facility]  . COLONOSCOPY N/A 09/04/2013   SLF: Small ulcer in the descending colon, one colon polyp (tubular adenoma), large internal hemorrhoids, moderate diverticulosis. next tcs 10 years.  . ESOPHAGOGASTRODUODENOSCOPY N/A 05/23/2015   ZOX:WRUE non-erosive gastritis/stricture at the gastroesophageal junction  . ESOPHAGOGASTRODUODENOSCOPY (EGD) WITH ESOPHAGEAL DILATION N/A 06/08/2013   Dr. Fields:Stricture at the gastroesophageal junction/MILD NON-erosive gastritis (inflammation) was found in the gastric antrum; multiple biopsies/NO SOURCE FOR ANEMIA IDETIFIED-MOST LIKELY  DUE TO ANEMIA OF CHRONIC DISEASE  . FLEXIBLE SIGMOIDOSCOPY N/A 01/02/2016   Dr. Darrick Penna: external hemorrhoids, diverticulosis, internal hemorrhoids, proximal rectum normal  . GIVENS CAPSULE STUDY N/A 12/03/2013   SLF: occasional gastric erosion. small bowel normal  . HIP PINNING,CANNULATED Left 05/31/2015   Procedure: CANNULATED HIP PINNING;  Surgeon: Tarry Kos, MD;  Location: [facility] OR;  Service: Orthopedics;  Laterality: Left;  . INSERTION OF DIALYSIS CATHETER Left 09/17/2013   Procedure: INSERTION OF DIALYSIS CATHETER;  Surgeon: Fransisco Hertz, MD;  Location: [facility] OR;  Service: Vascular;  Laterality: Left;  . LAPAROSCOPIC TOTAL HYSTERECTOMY    . NASAL SEPTUM SURGERY    . SAVORY DILATION N/A 05/23/2015   Procedure: SAVORY DILATION;  Surgeon: West Bali, MD;  Location: [facility] SUITE;  Service: Endoscopy;  Laterality: N/A;      Chief Complaint  Patient presents with  . Altered Mental Status      HPI:    Sonya [name]  is a 66 y.o. female with past medical history relevant for ESRD hemodialysis dependent on Tuesdays Thursdays and Saturdays and history of previous intracranial hemorrhage with now Presents to the ED with confusional episodes and is found to be hypoglycemic with blood sugars around 60.  Patient went for hemodialysis this morning, but did not complete a hemodialysis session went back home refused to eat all day Blood sugars in the 50s and 60s despite repeated doses of D50, no oral hypoglycemics or insulin administered previously. Confusional episodes appear to be associated with hypoglycemia, despite encouragement patient is not eating.   No fall, no trauma reported  No URI symptoms  In ED patient is found to be persistently hypoglycemic, but she also has a low-grade fever,  temp 100.4.   No vomiting or diarrhea  In ED... She received D50 iv , then had recurrent hypoglycemia    Review of systems:    In addition to the HPI above,   A full 12 point Review of 10 Systems  was done, except as stated above, all other Review of 10 Systems were negative.    Social History:  Reviewed by me    Social History  Substance Use Topics  . Smoking status: Never Smoker  . Smokeless tobacco: Never Used  . Alcohol use No       Family History :  Reviewed by me    Family History  Problem Relation Age of Onset  . Cancer Mother   . Diabetes Mother   . Hypertension Mother   . Diabetes Father   . Hypertension Father   . Hypertension Sister   . Colon cancer Neg Hx     Home Medications:   Prior to Admission medications   Medication Sig Start Date End Date Taking? Authorizing Provider  ALPRAZolam Prudy Feeler) 0.5 MG  tablet Take 1 tablet (0.5 mg total) by mouth 2 (two) times daily. 12/29/16  Yes Fields, Sandi L, MD  labetalol (NORMODYNE) 200 MG tablet Take 600 mg by mouth 2 (two) times daily.    Yes [provider]  NIFEdipine (PROCARDIA XL/ADALAT-CC) 60 MG 24 hr tablet Take 60 mg by mouth daily.  04/10/15  Yes [provider]  omeprazole (PRILOSEC) 20 MG capsule TAKE ONE CAPSULE BY MOUTH ONCE DAILY 05/18/16  Yes Gelene Mink, NP  alendronate (FOSAMAX) 70 MG tablet Take 70 mg by mouth once a week. Take with a full glass of water on an empty stomach.    [provider]  allopurinol (ZYLOPRIM) 300 MG tablet Take 0.5 tablets (150 mg total) by mouth daily. Dose was decreased secondary to renal failure. Patient taking differently: Take 300 mg by mouth daily. Dose was decreased secondary to renal failure. 06/16/15   Elliot Cousin, MD  bisacodyl (DULCOLAX) 5 MG EC tablet Take 5 mg by mouth daily as needed for moderate constipation.    [provider]  cinacalcet (SENSIPAR) 30 MG tablet Take 30 mg by mouth daily.    [provider]  ciprofloxacin (CIPRO) 500 MG tablet Take 1 tablet (500 mg total) by mouth daily. Patient not taking: Reported on 12/07/2016 11/21/16   Erick Blinks, MD  diazepam (VALIUM) 2 MG tablet Take 2 mg by mouth  every 6 (six) hours as needed for anxiety.    [provider]  doxycycline (VIBRAMYCIN) 100 MG capsule Take 1 capsule by mouth 2 (two) times daily. 12/03/16   [provider]  levothyroxine (SYNTHROID, LEVOTHROID) 50 MCG tablet Take 1 tablet (50 mcg total) by mouth daily. 04/17/15   Standley Brooking, MD  lidocaine-prilocaine (EMLA) cream Apply 1 application topically every Tuesday, Thursday, and Saturday at 6 PM.     [provider]  meclizine (ANTIVERT) 25 MG tablet Take 1 tablet by mouth every 6 (six) hours as needed for dizziness. dizziness 11/22/14   [provider]  metoCLOPramide (REGLAN) 5 MG tablet 1 po before breakfast. 01/02/16   Fields, Darleene Cleaver, MD  metroNIDAZOLE (FLAGYL) 500 MG tablet Take 1 tablet (500 mg total) by mouth every 8 (eight) hours. Patient not taking: Reported on 12/07/2016 11/20/16   Erick Blinks, MD  sevelamer carbonate (RENVELA) 800 MG tablet Take 2,400 mg by mouth 3 (three) times daily with meals.     [provider]     Allergies:     Allergies  Allergen Reactions  . Cephalexin Itching     Physical Exam:   Vitals  Blood pressure 125/60, pulse (!) 115, temperature (!) 100.4 F (38 C), temperature source Rectal, resp. rate 19, height 5\' 5"  (1.651 m), weight 60.8 kg (134 lb), SpO2 100 %.  Physical Examination: General appearance - Chronically eating appearing, and in no distress  Mental status - confused and disoriented  Eyes - sclera anicteric Neck - supple, no JVD elevation , Chest - clear  to auscultation bilaterally, symmetrical air movement,  Heart - S1 and S2 normal,  Abdomen - soft, nontender, nondistended, no masses or organomegaly Neurological - screening mental status exam normal, neck supple without rigidity, cranial nerves II through XII intact, DTR's normal and symmetric Extremities - no pedal edema noted, intact peripheral pulses , AV fistula site and upper extremity with positive thrill and  bruit Skin - warm, dry    Data Review:    CBC  Recent Labs Lab 01/18/17 1504 01/18/17 1520  WBC 6.9  --   HGB 11.1* 12.6  HCT 34.5* 37.0  PLT 202  --   MCV 79.1  --   MCH 25.5*  --   MCHC 32.2  --   RDW 18.4*  --   LYMPHSABS 1.0  --   MONOABS 0.6  --   EOSABS 0.0  --   BASOSABS 0.0  --    ------------------------------------------------------------------------------------------------------------------  Chemistries   Recent Labs Lab 01/18/17 1504 01/18/17 1520  NA 136 138  K 2.9* 2.9*  CL 98* 100*  CO2 23  --   GLUCOSE 81 78  BUN 20 19  CREATININE 4.70* 4.80*  CALCIUM 8.5*  --   AST 17  --   ALT 9*  --   ALKPHOS 54  --   BILITOT 0.5  --    ------------------------------------------------------------------------------------------------------------------ estimated creatinine clearance is 10.2 mL/min (A) (by C-G formula based on SCr of 4.8 mg/dL (H)). ------------------------------------------------------------------------------------------------------------------ No results for input(s): TSH, T4TOTAL, T3FREE, THYROIDAB in the last 72 hours.  Invalid input(s): FREET3   Coagulation profile No results for input(s): INR, PROTIME in the last 168 hours. ------------------------------------------------------------------------------------------------------------------- No results for input(s): DDIMER in the last 72 hours. -------------------------------------------------------------------------------------------------------------------  Cardiac Enzymes No results for input(s): CKMB, TROPONINI, MYOGLOBIN in the last 168 hours.  Invalid input(s): CK ------------------------------------------------------------------------------------------------------------------    Component Value Date/Time   BNP 3,538.0 (H) 08/19/2015 1311     ---------------------------------------------------------------------------------------------------------------  Urinalysis     Component Value Date/Time   COLORURINE YELLOW 04/14/2015 1427   APPEARANCEUR CLEAR 04/14/2015 1427   LABSPEC 1.020 04/14/2015 1427   PHURINE 8.5 (H) 04/14/2015 1427   GLUCOSEU 100 (A) 04/14/2015 1427   HGBUR SMALL (A) 04/14/2015 1427   BILIRUBINUR NEGATIVE 04/14/2015 1427   KETONESUR NEGATIVE 04/14/2015 1427   PROTEINUR >300 (A) 04/14/2015 1427   UROBILINOGEN 0.2 04/14/2015 1427   NITRITE NEGATIVE 04/14/2015 1427   LEUKOCYTESUR TRACE (A) 04/14/2015 1427    ----------------------------------------------------------------------------------------------------------------   Imaging Results:    Ct Abdomen Pelvis Wo Contrast  Result Date: 01/18/2017 CLINICAL DATA:  Altered mental status after dialysis today, history uterine cancer, end-stage renal disease, hypertension EXAM: CT CHEST, ABDOMEN AND PELVIS WITHOUT CONTRAST TECHNIQUE: Multidetector CT imaging of the chest, abdomen and pelvis was performed following the standard protocol without IV contrast. Sagittal and coronal MPR images reconstructed from axial data set. Oral contrast was not administered. COMPARISON:  CT abdomen and pelvis 11/20/2016 FINDINGS: CT CHEST FINDINGS Cardiovascular: Atherosclerotic calcifications aorta, coronary arteries and proximal great vessels. Minimal pericardial thickening or fluid. Mediastinum/Nodes: Esophagus unremarkable. Base of cervical region normal appearance. No thoracic adenopathy. Lungs/Pleura: Minimal scattered atelectasis in lower lungs. No acute infiltrate, pleural effusion, or pneumothorax. Musculoskeletal: Bones demineralized. CT ABDOMEN PELVIS FINDINGS Hepatobiliary: Gallbladder surgically absent. No focal hepatic abnormalities. Pancreas: Normal appearance Spleen: Normal appearance Adrenals/Urinary Tract: Unremarkable adrenal glands. BILATERAL renal cortical atrophy and small renal cysts. No hydronephrosis or hydroureter. Bladder decompressed. Stomach/Bowel: Appendix surgically absent by history.  Diverticulosis of descending and sigmoid colon without diverticulitis changes. Stomach and bowel loops otherwise normal appearance. Vascular/Lymphatic: Atherosclerotic calcifications. Aorta normal caliber. Scattered pelvic phleboliths. No adenopathy. Reproductive: Uterus surgically absent with nonvisualization of ovaries Other: Umbilical hernia containing fat.  No free air or free fluid. Musculoskeletal: Cannulated screws proximal LEFT femur. Diffuse osseous demineralization. IMPRESSION: No significant intrathoracic abnormalities. Distal colonic diverticulosis without evidence of diverticulitis. Atrophic kidneys with BILATERAL renal cysts. Umbilical hernia containing fat. Coronary artery calcifications. Aortic Atherosclerosis (ICD10-I70.0). Electronically Signed   By: Angelyn Punt.D.  On: 01/18/2017 19:48   Ct Head Wo Contrast  Result Date: 01/18/2017 CLINICAL DATA:  66 y/o  F; altered mental status. EXAM: CT HEAD WITHOUT CONTRAST TECHNIQUE: Contiguous axial images were obtained from the base of the skull through the vertex without intravenous contrast. COMPARISON:  11/19/2016 CT of head. FINDINGS: Brain: No evidence of acute infarction, hemorrhage, hydrocephalus, extra-axial collection or mass lesion/mass effect. Stable advanced chronic microvascular ischemic changes of white matter moderate brain parenchymal volume loss. Portable chronic lacunar infarcts within the basal ganglia prominent within the thalamus are stable from prior study. Vascular: Calcific atherosclerosis of carotid siphons and vertebral arteries. 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 Sinuses/Orbits: No acute finding. Other: None. IMPRESSION: 1. No acute intracranial abnormality identified. 2. Stable advanced chronic microvascular ischemic changes and moderate parenchymal volume loss of the brain. 3. 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 Electronically Signed   By: Mitzi Hansen M.D.   On: 01/18/2017 19:38   Ct Chest Wo Contrast  Result Date: 01/18/2017 CLINICAL DATA:  Altered mental status after dialysis today, history uterine cancer, end-stage renal disease, hypertension EXAM: CT CHEST, ABDOMEN AND PELVIS WITHOUT CONTRAST TECHNIQUE: Multidetector CT imaging of the chest, abdomen and pelvis was performed following the standard protocol without IV contrast. Sagittal and coronal MPR images reconstructed from axial data set. Oral contrast was not administered. COMPARISON:  CT abdomen and pelvis 11/20/2016 FINDINGS: CT CHEST FINDINGS Cardiovascular: Atherosclerotic calcifications aorta, coronary arteries and proximal great vessels. Minimal pericardial thickening or fluid. Mediastinum/Nodes: Esophagus unremarkable. Base of cervical region normal appearance. No thoracic adenopathy. Lungs/Pleura: Minimal scattered atelectasis in lower lungs. No acute infiltrate, pleural effusion, or pneumothorax. Musculoskeletal: Bones demineralized. CT ABDOMEN PELVIS FINDINGS Hepatobiliary: Gallbladder surgically absent. No focal hepatic abnormalities. Pancreas: Normal appearance Spleen: Normal appearance Adrenals/Urinary Tract: Unremarkable adrenal glands. BILATERAL renal cortical atrophy and small renal cysts. No hydronephrosis or hydroureter. Bladder decompressed. Stomach/Bowel: Appendix surgically absent by history. Diverticulosis of descending and sigmoid colon without diverticulitis changes. Stomach and bowel loops otherwise normal appearance. Vascular/Lymphatic: Atherosclerotic calcifications. Aorta normal caliber. Scattered pelvic phleboliths. No adenopathy. Reproductive: Uterus surgically absent with nonvisualization of ovaries Other: Umbilical hernia containing fat.  No free air or free fluid. Musculoskeletal: Cannulated screws proximal LEFT femur. Diffuse osseous demineralization. IMPRESSION: No significant  intrathoracic abnormalities. Distal colonic diverticulosis without evidence of diverticulitis. Atrophic kidneys with BILATERAL renal cysts. Umbilical hernia containing fat. Coronary artery calcifications. Aortic Atherosclerosis (ICD10-I70.0). Electronically Signed   By: Ulyses Southward M.D.   On: 01/18/2017 19:48   Dg Chest Port 1 View  Result Date: 01/18/2017 CLINICAL DATA:  Altered mental status. EXAM: PORTABLE CHEST 1 VIEW COMPARISON:  09/11/2015 FINDINGS: The heart size and mediastinal contours are within normal limits. Both lungs are clear. The visualized skeletal structures are unremarkable. IMPRESSION: No active disease. Electronically Signed   By: Elige Ko   On: 01/18/2017 14:44    Radiological Exams on Admission: Ct Abdomen Pelvis Wo Contrast  Result Date: 01/18/2017 CLINICAL DATA:  Altered mental status after dialysis today, history uterine cancer, end-stage renal disease, hypertension EXAM: CT CHEST, ABDOMEN AND PELVIS WITHOUT CONTRAST TECHNIQUE: Multidetector CT imaging of the chest, abdomen and pelvis was performed following the standard protocol without IV contrast. Sagittal and coronal MPR images reconstructed from axial data set. Oral contrast was not administered. COMPARISON:  CT abdomen and pelvis 11/20/2016 FINDINGS: CT CHEST FINDINGS Cardiovascular: Atherosclerotic calcifications aorta, coronary arteries and proximal  great vessels. Minimal pericardial thickening or fluid. Mediastinum/Nodes: Esophagus unremarkable. Base of cervical region normal appearance. No thoracic adenopathy. Lungs/Pleura: Minimal scattered atelectasis in lower lungs. No acute infiltrate, pleural effusion, or pneumothorax. Musculoskeletal: Bones demineralized. CT ABDOMEN PELVIS FINDINGS Hepatobiliary: Gallbladder surgically absent. No focal hepatic abnormalities. Pancreas: Normal appearance Spleen: Normal appearance Adrenals/Urinary Tract: Unremarkable adrenal glands. BILATERAL renal cortical atrophy and small renal  cysts. No hydronephrosis or hydroureter. Bladder decompressed. Stomach/Bowel: Appendix surgically absent by history. Diverticulosis of descending and sigmoid colon without diverticulitis changes. Stomach and bowel loops otherwise normal appearance. Vascular/Lymphatic: Atherosclerotic calcifications. Aorta normal caliber. Scattered pelvic phleboliths. No adenopathy. Reproductive: Uterus surgically absent with nonvisualization of ovaries Other: Umbilical hernia containing fat.  No free air or free fluid. Musculoskeletal: Cannulated screws proximal LEFT femur. Diffuse osseous demineralization. IMPRESSION: No significant intrathoracic abnormalities. Distal colonic diverticulosis without evidence of diverticulitis. Atrophic kidneys with BILATERAL renal cysts. Umbilical hernia containing fat. Coronary artery calcifications. Aortic Atherosclerosis (ICD10-I70.0). Electronically Signed   By: Ulyses Southward M.D.   On: 01/18/2017 19:48   Ct Head Wo Contrast  Result Date: 01/18/2017 CLINICAL DATA:  66 y/o  F; altered mental status. EXAM: CT HEAD WITHOUT CONTRAST TECHNIQUE: Contiguous axial images were obtained from the base of the skull through the vertex without intravenous contrast. COMPARISON:  11/19/2016 CT of head. FINDINGS: Brain: No evidence of acute infarction, hemorrhage, hydrocephalus, extra-axial collection or mass lesion/mass effect. Stable advanced chronic microvascular ischemic changes of white matter moderate brain parenchymal volume loss. Portable chronic lacunar infarcts within the basal ganglia prominent within the thalamus are stable from prior study. Vascular: Calcific atherosclerosis of carotid siphons and vertebral arteries. 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 Sinuses/Orbits: No acute finding. Other: None. IMPRESSION: 1. No acute intracranial abnormality identified. 2. Stable advanced chronic microvascular ischemic changes and moderate  parenchymal volume loss of the brain. 3. 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 Electronically Signed   By: Mitzi Hansen M.D.   On: 01/18/2017 19:38   Ct Chest Wo Contrast  Result Date: 01/18/2017 CLINICAL DATA:  Altered mental status after dialysis today, history uterine cancer, end-stage renal disease, hypertension EXAM: CT CHEST, ABDOMEN AND PELVIS WITHOUT CONTRAST TECHNIQUE: Multidetector CT imaging of the chest, abdomen and pelvis was performed following the standard protocol without IV contrast. Sagittal and coronal MPR images reconstructed from axial data set. Oral contrast was not administered. COMPARISON:  CT abdomen and pelvis 11/20/2016 FINDINGS: CT CHEST FINDINGS Cardiovascular: Atherosclerotic calcifications aorta, coronary arteries and proximal great vessels. Minimal pericardial thickening or fluid. Mediastinum/Nodes: Esophagus unremarkable. Base of cervical region normal appearance. No thoracic adenopathy. Lungs/Pleura: Minimal scattered atelectasis in lower lungs. No acute infiltrate, pleural effusion, or pneumothorax. Musculoskeletal: Bones demineralized. CT ABDOMEN PELVIS FINDINGS Hepatobiliary: Gallbladder surgically absent. No focal hepatic abnormalities. Pancreas: Normal appearance Spleen: Normal appearance Adrenals/Urinary Tract: Unremarkable adrenal glands. BILATERAL renal cortical atrophy and small renal cysts. No hydronephrosis or hydroureter. Bladder decompressed. Stomach/Bowel: Appendix surgically absent by history. Diverticulosis of descending and sigmoid colon without diverticulitis changes. Stomach and bowel loops otherwise normal appearance. Vascular/Lymphatic: Atherosclerotic calcifications. Aorta normal caliber. Scattered pelvic phleboliths. No adenopathy. Reproductive: Uterus surgically absent with nonvisualization of ovaries Other: Umbilical hernia containing fat.  No free air or free fluid.  Musculoskeletal: Cannulated screws proximal LEFT femur. Diffuse osseous demineralization. IMPRESSION: No significant intrathoracic abnormalities. Distal colonic diverticulosis without evidence of diverticulitis. Atrophic kidneys with BILATERAL renal cysts. Umbilical hernia containing fat. Coronary  artery calcifications. Aortic Atherosclerosis (ICD10-I70.0). Electronically Signed   By: Ulyses Southward M.D.   On: 01/18/2017 19:48   Dg Chest Port 1 View  Result Date: 01/18/2017 CLINICAL DATA:  Altered mental status. EXAM: PORTABLE CHEST 1 VIEW COMPARISON:  09/11/2015 FINDINGS: The heart size and mediastinal contours are within normal limits. Both lungs are clear. The visualized skeletal structures are unremarkable. IMPRESSION: No active disease. Electronically Signed   By: Elige Ko   On: 01/18/2017 14:44    DVT Prophylaxis -SCD   AM Labs Ordered, also please review Full Orders  Family Communication: Admission, patients condition and plan of care including tests being ordered have been discussed with the patient and cousins who indicate understanding and agree with the plan   Code Status - Full Code  Likely DC to  Home   Condition   stable  Areya Lemmerman M.D on 01/18/2017 at 8:22 PM   Between 7am to 7pm - Pager - [phone]  After 7pm go to [email or web address] - password [email or web address]  [facility] - Office  [phone]  Voice Recognition Reubin [place] dictation system was used to create this note, attempts have been made to correct errors. Please contact the author with questions and/or clarifications.

## 2017-01-19 DIAGNOSIS — R4182 Altered mental status, unspecified: Secondary | ICD-10-CM

## 2017-01-19 DIAGNOSIS — G9340 Encephalopathy, unspecified: Secondary | ICD-10-CM

## 2017-01-19 DIAGNOSIS — N186 End stage renal disease: Secondary | ICD-10-CM

## 2017-01-19 DIAGNOSIS — Z992 Dependence on renal dialysis: Secondary | ICD-10-CM

## 2017-01-19 DIAGNOSIS — E162 Hypoglycemia, unspecified: Principal | ICD-10-CM

## 2017-01-19 DIAGNOSIS — E440 Moderate protein-calorie malnutrition: Secondary | ICD-10-CM | POA: Insufficient documentation

## 2017-01-19 DIAGNOSIS — I10 Essential (primary) hypertension: Secondary | ICD-10-CM

## 2017-01-19 LAB — GLUCOSE, CAPILLARY
GLUCOSE-CAPILLARY: 85 mg/dL (ref 65–99)
GLUCOSE-CAPILLARY: 81 mg/dL (ref 65–99)
GLUCOSE-CAPILLARY: 93 mg/dL (ref 65–99)
Glucose-Capillary: 87 mg/dL (ref 65–99)
Glucose-Capillary: 85 mg/dL (ref 65–99)
Glucose-Capillary: 85 mg/dL (ref 65–99)

## 2017-01-19 LAB — BASIC METABOLIC PANEL
Anion gap: 11 (ref 5–15)
BUN: 26 mg/dL — ABNORMAL HIGH (ref 6–20)
CALCIUM: 8.3 mg/dL — AB (ref 8.9–10.3)
CO2: 24 mmol/L (ref 22–32)
CREATININE: 6.88 mg/dL — AB (ref 0.44–1.00)
Chloride: 97 mmol/L — ABNORMAL LOW (ref 101–111)
GFR calc non Af Amer: 6 mL/min — ABNORMAL LOW (ref >60)
GFR, EST AFRICAN AMERICAN: 6 mL/min — AB (ref >60)
Glucose, Bld: 97 mg/dL (ref 65–99)
Potassium: 4.5 mmol/L (ref 3.5–5.1)
SODIUM: 132 mmol/L — AB (ref 135–145)

## 2017-01-19 LAB — MRSA PCR SCREENING: MRSA BY PCR: NEGATIVE

## 2017-01-19 MED ORDER — DEXTROSE 5 % IV SOLN
INTRAVENOUS | Status: AC
  Filled 2017-01-19: qty 1

## 2017-01-19 MED ORDER — ORAL CARE MOUTH RINSE
15.0000 mL | Freq: Two times a day (BID) | OROMUCOSAL | Status: DC
  Administered 2017-01-19 – 2017-01-20 (×3): 15 mL via OROMUCOSAL

## 2017-01-19 MED ORDER — POTASSIUM CHLORIDE 20 MEQ PO PACK
40.0000 meq | PACK | Freq: Once | ORAL | Status: AC
  Administered 2017-01-19: 40 meq via ORAL
  Filled 2017-01-19: qty 2

## 2017-01-19 MED ORDER — LABETALOL HCL 200 MG PO TABS
200.0000 mg | ORAL_TABLET | Freq: Two times a day (BID) | ORAL | Status: DC
  Administered 2017-01-19: 200 mg via ORAL
  Filled 2017-01-19: qty 1

## 2017-01-19 MED ORDER — VANCOMYCIN HCL 500 MG IV SOLR
500.0000 mg | INTRAVENOUS | Status: DC
  Filled 2017-01-19: qty 500

## 2017-01-19 MED ORDER — PANTOPRAZOLE SODIUM 40 MG PO TBEC
40.0000 mg | DELAYED_RELEASE_TABLET | Freq: Two times a day (BID) | ORAL | Status: DC
  Administered 2017-01-19 – 2017-01-20 (×2): 40 mg via ORAL
  Filled 2017-01-19 (×2): qty 1

## 2017-01-19 MED ORDER — LEVOFLOXACIN IN D5W 500 MG/100ML IV SOLN: 500.0000 mg | INTRAVENOUS | Status: DC

## 2017-01-19 MED ORDER — PANTOPRAZOLE SODIUM 40 MG PO TBEC: 40.0000 mg | DELAYED_RELEASE_TABLET | Freq: Every day | ORAL | Status: DC

## 2017-01-19 NOTE — Plan of Care (Signed)
Problem: Pain Managment: Goal: General experience of comfort will improve Outcome: Progressing Pt has no c/o pain. No pain medication requested so far this shift. Pt alert and oriented. Will continue to monitor pt

## 2017-01-19 NOTE — Care Management Note (Signed)
Case Management Note  Patient Details  Name: Sonya [name] MRN: [id number] Date of Birth: Oct 27, 1950  Subjective/Objective:                  Pt admitted with hypoglycemia. Pt lives with her husband (who is end stage CA on hospice services). Pt ESRD on HD T/TH/Sa. Family provides strong support. Pt has PCP, transportation and insurance with drug coverage. Pt's family at bedside. Pt has no HH services pta. CM discussed services her medicare would pay for in the home. Pt not interested at this time. Pt reports needing 'someone to clean her floors'. Explained to pt she would have to pay privately for those services and pt not interested at this time. Pt communicates no needs.   Action/Plan: Anticipate DC home with self care. No CM needs, will follow to DC.   Expected Discharge Date:     01/20/2017            Expected Discharge Plan:  Home/Self Care  In-House Referral:  NA  Discharge planning Services  CM Consult  Post Acute Care Choice:  NA Choice offered to:  NA  Status of Service:  Completed, signed off  Sherald Barge, RN 01/19/2017, 11:03 AM

## 2017-01-19 NOTE — Progress Notes (Signed)
PROGRESS NOTE    Sonya [name]  ZWC:[id number] DOB: 20-Feb-1951 DOA: 01/18/2017 PCP: Redmond School, MD    Brief Narrative:  66 year old female presented with altered mental status. Patient is known to have end-stage renal disease on hemodialysis, and history of intracranial hemorrhage. The day of admission of admission patient did not complete her hemodialysis treatment, had poor oral intake, she developed hypoglycemia. On initial physical examination blood pressure 125/60, heart rate 115, temperature 100.4, respiratory rate 19. Oxygen saturation 100%. She was confused and disoriented, lungs were clear to auscultation, heart S1-S2 present rhythmic, abdomen soft, no lower extremity edema. Sodium 138, potassium 2.9, chloride 100, bicarbonate 23, glucose 78, BUN 19, creatinine 4.0, calcium ionized 0.9, lactic acid 3.8, white count 6.9, hemoglobin 11.1, hematocrit 34.5, platelets 202. Chest x-ray with increased lung markings, no infiltrates, CT of the abdomen and chest with no acute abnormalities. Head CT with no intracranial abnormalities. EKG was sinus rhythm. Patient was admitted to hospital with working diagnosis of persistent hypoglycemia complicated by fever and acute metabolic encephalopathy.   Assessment & Plan:   Principal Problem:   Hypoglycemia Active Problems:   Essential hypertension   Anemia in chronic kidney disease   ESRD (end stage renal disease) (HCC)   Encephalopathy acute  1. Metabolic encephalopathy due to hypoglycemia. Patient with very low po intake due to poor appetite, suspected low glycogen storages that combined with ESRD, ar e the cause of hypoglycemia. Patient with improved po intake, will hold on D10 infusion and will continue glucose monitoring q 4 hours.   2. Fever. Chest film with no infiltrates, no deep infection in the abdomen, no clinical signs of systemic infection, will hold on all antibiotics and will continue to follow on temperature curve and cell count.   WBc stable at 6.9.   3. ESRD on HD with hypokalemia. Patient did not complete HD treatment yesterday, but today no signs of volume overload or uremia, K at 2,9 and serum bicarbonate at 23. Will give 40 po of kcl and check bmp this pm. Continue sevelamer and sensipar.   4. HTN. Will continue labetalol 200 mg bid,.   5. Depression, Continue trazadone, alprazolam and diazepam. Patient had IV lorazepam on admission.   6. Hypothyroid. Continue levothyroxine.   DVT prophylaxis: heparin  Code Status: Full  Family Communication:  Disposition Plan:    Consultants:   Nephrology   Procedures:    Antimicrobials:      Subjective: Patient feeling better, no nausea or vomiting. At home with very poor oral intake, associated with nausea. No dyspnea, chest pain, cough.   Objective: Vitals:   01/18/17 1930 01/18/17 2112 01/19/17 0351 01/19/17 0633  BP: 124/73 (!) 118/59  (!) 92/44  Pulse:  (!) 105  92  Resp:  18  18  Temp:  98.7 F (37.1 C)  98.8 F (37.1 C)  TempSrc:  Oral  Oral  SpO2:  100% 100% 100%  Weight:  62.4 kg (137 lb 8 oz)  62.1 kg (136 lb 13.9 oz)  Height:  5\' 5"  (1.651 m)      Intake/Output Summary (Last 24 hours) at 01/19/17 0739 Last data filed at 01/19/17 0656  Gross per 24 hour  Intake           393.83 ml  Output                0 ml  Net           393.83 ml   [employer]  01/18/17 1415 01/18/17 2112 01/19/17 0633  Weight: 60.8 kg (134 lb) 62.4 kg (137 lb 8 oz) 62.1 kg (136 lb 13.9 oz)    Examination:  General exam: deconditioned E ENT. Mild pallor, no icterus, oral mucosa moist.   Respiratory system: Clear to auscultation. Respiratory effort normal. No wheezing, rales or rhonchi.  Cardiovascular system: S1 & S2 heard, RRR. No JVD, murmurs, rubs, gallops or clicks. No pedal edema. Gastrointestinal system: Abdomen is nondistended, soft and nontender. No organomegaly or masses felt. Normal bowel sounds heard. Central nervous system: Alert and  oriented. No focal neurological deficits. Extremities: Symmetric 5 x 5 power. Skin: No rashes, lesions or ulcers     Data Reviewed: I have personally reviewed following labs and imaging studies  CBC:  Recent Labs Lab 01/18/17 1504 01/18/17 1520  WBC 6.9  --   NEUTROABS 5.2  --   HGB 11.1* 12.6  HCT 34.5* 37.0  MCV 79.1  --   PLT 202  --    Basic Metabolic Panel:  Recent Labs Lab 01/18/17 1504 01/18/17 1520  NA 136 138  K 2.9* 2.9*  CL 98* 100*  CO2 23  --   GLUCOSE 81 78  BUN 20 19  CREATININE 4.70* 4.80*  CALCIUM 8.5*  --    GFR: Estimated Creatinine Clearance: 10.2 mL/min (A) (by C-G formula based on SCr of 4.8 mg/dL (H)). Liver Function Tests:  Recent Labs Lab 01/18/17 1504  AST 17  ALT 9*  ALKPHOS 54  BILITOT 0.5  PROT 7.3  ALBUMIN 3.4*   No results for input(s): LIPASE, AMYLASE in the last 168 hours. No results for input(s): AMMONIA in the last 168 hours. Coagulation Profile: No results for input(s): INR, PROTIME in the last 168 hours. Cardiac Enzymes: No results for input(s): CKTOTAL, CKMB, CKMBINDEX, TROPONINI in the last 168 hours. BNP (last 3 results) No results for input(s): PROBNP in the last 8760 hours. HbA1C: No results for input(s): HGBA1C in the last 72 hours. CBG:  Recent Labs Lab 01/18/17 1931 01/18/17 2050 01/18/17 2113 01/19/17 0223 01/19/17 0728  GLUCAP 66 94 94 85 87   Lipid Profile: No results for input(s): CHOL, HDL, LDLCALC, TRIG, CHOLHDL, LDLDIRECT in the last 72 hours. Thyroid Function Tests: No results for input(s): TSH, T4TOTAL, FREET4, T3FREE, THYROIDAB in the last 72 hours. Anemia Panel: No results for input(s): VITAMINB12, FOLATE, FERRITIN, TIBC, IRON, RETICCTPCT in the last 72 hours. Sepsis Labs:  Recent Labs Lab 01/18/17 1520 01/18/17 1819  LATICACIDVEN 3.85* 4.45*    Recent Results (from the past 240 hour(s))  Blood Culture (routine x 2)     Status: None (Preliminary result)   Collection Time:  01/18/17  3:09 PM  Result Value Ref Range Status   Specimen Description BLOOD LEFT HAND  Final   Special Requests   Final    BOTTLES DRAWN AEROBIC AND ANAEROBIC Blood Culture adequate volume   Culture PENDING  Incomplete   Report Status PENDING  Incomplete  Blood Culture (routine x 2)     Status: None (Preliminary result)   Collection Time: 01/18/17  3:09 PM  Result Value Ref Range Status   Specimen Description LEFT ANTECUBITAL  Final   Special Requests   Final    BOTTLES DRAWN AEROBIC AND ANAEROBIC Blood Culture adequate volume   Culture PENDING  Incomplete   Report Status PENDING  Incomplete         Radiology Studies: Ct Abdomen Pelvis Wo Contrast  Result Date: 01/18/2017 CLINICAL DATA:  Altered mental status after dialysis today, history uterine cancer, end-stage renal disease, hypertension EXAM: CT CHEST, ABDOMEN AND PELVIS WITHOUT CONTRAST TECHNIQUE: Multidetector CT imaging of the chest, abdomen and pelvis was performed following the standard protocol without IV contrast. Sagittal and coronal MPR images reconstructed from axial data set. Oral contrast was not administered. COMPARISON:  CT abdomen and pelvis 11/20/2016 FINDINGS: CT CHEST FINDINGS Cardiovascular: Atherosclerotic calcifications aorta, coronary arteries and proximal great vessels. Minimal pericardial thickening or fluid. Mediastinum/Nodes: Esophagus unremarkable. Base of cervical region normal appearance. No thoracic adenopathy. Lungs/Pleura: Minimal scattered atelectasis in lower lungs. No acute infiltrate, pleural effusion, or pneumothorax. Musculoskeletal: Bones demineralized. CT ABDOMEN PELVIS FINDINGS Hepatobiliary: Gallbladder surgically absent. No focal hepatic abnormalities. Pancreas: Normal appearance Spleen: Normal appearance Adrenals/Urinary Tract: Unremarkable adrenal glands. BILATERAL renal cortical atrophy and small renal cysts. No hydronephrosis or hydroureter. Bladder decompressed. Stomach/Bowel: Appendix  surgically absent by history. Diverticulosis of descending and sigmoid colon without diverticulitis changes. Stomach and bowel loops otherwise normal appearance. Vascular/Lymphatic: Atherosclerotic calcifications. Aorta normal caliber. Scattered pelvic phleboliths. No adenopathy. Reproductive: Uterus surgically absent with nonvisualization of ovaries Other: Umbilical hernia containing fat.  No free air or free fluid. Musculoskeletal: Cannulated screws proximal LEFT femur. Diffuse osseous demineralization. IMPRESSION: No significant intrathoracic abnormalities. Distal colonic diverticulosis without evidence of diverticulitis. Atrophic kidneys with BILATERAL renal cysts. Umbilical hernia containing fat. Coronary artery calcifications. Aortic Atherosclerosis (ICD10-I70.0). Electronically Signed   By: Lavonia Dana M.D.   On: 01/18/2017 19:48   Ct Head Wo Contrast  Result Date: 01/18/2017 CLINICAL DATA:  66 y/o  F; altered mental status. EXAM: CT HEAD WITHOUT CONTRAST TECHNIQUE: Contiguous axial images were obtained from the base of the skull through the vertex without intravenous contrast. COMPARISON:  11/19/2016 CT of head. FINDINGS: Brain: No evidence of acute infarction, hemorrhage, hydrocephalus, extra-axial collection or mass lesion/mass effect. Stable advanced chronic microvascular ischemic changes of white matter moderate brain parenchymal volume loss. Portable chronic lacunar infarcts within the basal ganglia prominent within the thalamus are stable from prior study. Vascular: Calcific atherosclerosis of carotid siphons and vertebral arteries. 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 Sinuses/Orbits: No acute finding. Other: None. IMPRESSION: 1. No acute intracranial abnormality identified. 2. Stable advanced chronic microvascular ischemic changes and moderate parenchymal volume loss of the brain. 3. 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 Electronically Signed   By: Kristine Garbe M.D.   On: 01/18/2017 19:38   Ct Chest Wo Contrast  Result Date: 01/18/2017 CLINICAL DATA:  Altered mental status after dialysis today, history uterine cancer, end-stage renal disease, hypertension EXAM: CT CHEST, ABDOMEN AND PELVIS WITHOUT CONTRAST TECHNIQUE: Multidetector CT imaging of the chest, abdomen and pelvis was performed following the standard protocol without IV contrast. Sagittal and coronal MPR images reconstructed from axial data set. Oral contrast was not administered. COMPARISON:  CT abdomen and pelvis 11/20/2016 FINDINGS: CT CHEST FINDINGS Cardiovascular: Atherosclerotic calcifications aorta, coronary arteries and proximal great vessels. Minimal pericardial thickening or fluid. Mediastinum/Nodes: Esophagus unremarkable. Base of cervical region normal appearance. No thoracic adenopathy. Lungs/Pleura: Minimal scattered atelectasis in lower lungs. No acute infiltrate, pleural effusion, or pneumothorax. Musculoskeletal: Bones demineralized. CT ABDOMEN PELVIS FINDINGS Hepatobiliary: Gallbladder surgically absent. No focal hepatic abnormalities. Pancreas: Normal appearance Spleen: Normal appearance Adrenals/Urinary Tract: Unremarkable adrenal glands. BILATERAL renal cortical atrophy and small renal cysts. No hydronephrosis or hydroureter. Bladder decompressed. Stomach/Bowel: Appendix surgically absent by history. Diverticulosis of descending and sigmoid colon  without diverticulitis changes. Stomach and bowel loops otherwise normal appearance. Vascular/Lymphatic: Atherosclerotic calcifications. Aorta normal caliber. Scattered pelvic phleboliths. No adenopathy. Reproductive: Uterus surgically absent with nonvisualization of ovaries Other: Umbilical hernia containing fat.  No free air or free fluid. Musculoskeletal: Cannulated screws proximal LEFT femur. Diffuse osseous demineralization.  IMPRESSION: No significant intrathoracic abnormalities. Distal colonic diverticulosis without evidence of diverticulitis. Atrophic kidneys with BILATERAL renal cysts. Umbilical hernia containing fat. Coronary artery calcifications. Aortic Atherosclerosis (ICD10-I70.0). Electronically Signed   By: Lavonia Dana M.D.   On: 01/18/2017 19:48   Dg Chest Port 1 View  Result Date: 01/18/2017 CLINICAL DATA:  Altered mental status. EXAM: PORTABLE CHEST 1 VIEW COMPARISON:  09/11/2015 FINDINGS: The heart size and mediastinal contours are within normal limits. Both lungs are clear. The visualized skeletal structures are unremarkable. IMPRESSION: No active disease. Electronically Signed   By: Kathreen Devoid   On: 01/18/2017 14:44        Scheduled Meds: . allopurinol  150 mg Oral Daily  . ALPRAZolam  0.5 mg Oral BID  . cinacalcet  30 mg Oral Q breakfast  . feeding supplement (NEPRO CARB STEADY)  237 mL Oral TID WC  . heparin  5,000 Units Subcutaneous Q8H  . labetalol  600 mg Oral BID  . levothyroxine  50 mcg Oral QAC breakfast  . [START ON 01/20/2017] lidocaine-prilocaine  1 application Topical Q [id number]  . mouth rinse  15 mL Mouth Rinse BID  . NIFEdipine  60 mg Oral Daily  . pantoprazole  40 mg Oral Daily  . senna  1 tablet Oral BID  . sevelamer carbonate  2,400 mg Oral TID WC  . sodium chloride flush  3 mL Intravenous Q12H   Continuous Infusions: . sodium chloride    . aztreonam Stopped (01/19/17 0355)  . dextrose 50 mL/hr at 01/18/17 2341     LOS: 1 day      Tawni Millers, MD [facility] Pager [phone]  If 7PM-7AM, please contact night-coverage [email or web address] Password [email or web address] 01/19/2017, 7:39 AM

## 2017-01-19 NOTE — Consult Note (Signed)
Reason for Consult: End-stage renal disease Referring Physician: Dr. Gertie Fey is an 66 y.o. female.  HPI: She is a patient who has history of hypertension, CVA, end-stage renal disease on maintenance hemodialysis presently brought to the emergency room because of confusion and altered mental status. When she was evaluated patient was found to have persistent hypoglycemia is admitted to the hospital. Presently patient states that she happen and also she doesn't remember why she was brought in the hospital. since usually she doesn't eat her breakfast before dialysis she didn't anything yesterday. Presently she claims she is feeling better and asking me to go home.   Past Medical History:  Diagnosis Date  . Anemia   . CKD (chronic kidney disease)   . Diverticulitis   . Dysphagia   . ESRD (end stage renal disease) ([place])    On HD  . Fracture of femoral neck, left, closed ([place]) 05/30/2015  . Gastritis 06/2013 & 05/2015  . Gastroesophageal reflux disease with stricture 05/2015  . GERD (gastroesophageal reflux disease)   . HTN (hypertension)   . Hyperparathyroidism due to renal insufficiency ([place])   . Hypertensive urgency 06/2013 & 04/2015  . Hypothyroidism   . ICH (intracerebral hemorrhage) ([place]) 10/2014  . 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 Protein calorie malnutrition ([place])   . Stroke ([place])   . Uterine cancer ([place]) 2012  . Vertical diplopia   . Vertigo     Past Surgical History:  Procedure Laterality Date  . ABDOMINAL HYSTERECTOMY    . APPENDECTOMY    . BASCILIC VEIN TRANSPOSITION Right 09/17/2013   Procedure: BRACHIAL VEIN TRANSPOSITION;  Surgeon: Conrad Glendo, MD;  Location: [place];  Service: Vascular;  Laterality: Right;  . BASCILIC VEIN TRANSPOSITION Right 10/29/2013   Procedure: RIGHT SECOND STAGE BRACHIAL VEIN TRANSPOSITION;  Surgeon: Conrad Dillon, MD;  Location: [place];  Service: Vascular;  Laterality: Right;  . CESAREAN SECTION    . CHOLECYSTECTOMY    . COLONOSCOPY  2000    TICS, IH  . COLONOSCOPY  2003 NUR BRBPR D50 V6   DC/ TICS, IH  . COLONOSCOPY N/A 09/04/2013   SLF: Small ulcer in the descending colon, one colon polyp (tubular adenoma), large internal hemorrhoids, moderate diverticulosis. next tcs 10 years.  . ESOPHAGOGASTRODUODENOSCOPY N/A 05/23/2015   DXA:JOIN non-erosive gastritis/stricture at the gastroesophageal junction  . ESOPHAGOGASTRODUODENOSCOPY (EGD) WITH ESOPHAGEAL DILATION N/A 06/08/2013   Dr. Fields:Stricture at the gastroesophageal junction/MILD NON-erosive gastritis (inflammation) was found in the gastric antrum; multiple biopsies/NO SOURCE FOR ANEMIA IDETIFIED-MOST LIKELY DUE TO ANEMIA OF CHRONIC DISEASE  . FLEXIBLE SIGMOIDOSCOPY N/A 01/02/2016   Dr. Oneida Alar: external hemorrhoids, diverticulosis, internal hemorrhoids, proximal rectum normal  . GIVENS CAPSULE STUDY N/A 12/03/2013   SLF: occasional gastric erosion. small bowel normal  . HIP PINNING,CANNULATED Left 05/31/2015   Procedure: CANNULATED HIP PINNING;  Surgeon: Leandrew Koyanagi, MD;  Location: [facility];  Service: Orthopedics;  Laterality: Left;  . INSERTION OF DIALYSIS CATHETER Left 09/17/2013   Procedure: INSERTION OF DIALYSIS CATHETER;  Surgeon: Conrad [place], MD;  Location: [facility];  Service: Vascular;  Laterality: Left;  . LAPAROSCOPIC TOTAL HYSTERECTOMY    . NASAL SEPTUM SURGERY    . SAVORY DILATION N/A 05/23/2015   Procedure: SAVORY DILATION;  Surgeon: Danie Binder, MD;  [facility];  Service: Endoscopy;  Laterality: N/A;    Family History  Problem Relation Age of Onset  . Cancer Mother   . Diabetes Mother   .  Hypertension Mother   . Diabetes Father   . Hypertension Father   . Hypertension Sister   . Colon cancer Neg Hx     Social History:  reports that she has never smoked. She has never used smokeless tobacco. She reports that she does not drink alcohol or use drugs.  Allergies:  Allergies  Allergen Reactions  . Cephalexin Itching    Medications: I have  reviewed the patient's current medications.  Results for orders placed or performed during the hospital encounter of 01/18/17 (from the past 48 hour(s))  CBG monitoring, ED     Status: None   Collection Time: 01/18/17  2:20 PM  Result Value Ref Range   Glucose-Capillary 86 65 - 99 mg/dL  Comprehensive metabolic panel     Status: Abnormal   Collection Time: 01/18/17  3:04 PM  Result Value Ref Range   Sodium 136 135 - 145 mmol/L   Potassium 2.9 (L) 3.5 - 5.1 mmol/L   Chloride 98 (L) 101 - 111 mmol/L   CO2 23 22 - 32 mmol/L   Glucose, Bld 81 65 - 99 mg/dL   BUN 20 6 - 20 mg/dL   Creatinine, Ser 4.70 (H) 0.44 - 1.00 mg/dL   Calcium 8.5 (L) 8.9 - 10.3 mg/dL   Total Protein 7.3 6.5 - 8.1 g/dL   Albumin 3.4 (L) 3.5 - 5.0 g/dL   AST 17 15 - 41 U/L   ALT 9 (L) 14 - 54 U/L   Alkaline Phosphatase 54 38 - 126 U/L   Total Bilirubin 0.5 0.3 - 1.2 mg/dL   GFR calc non Af Amer 9 (L) >60 mL/min   GFR calc Af Amer 10 (L) >60 mL/min    Comment: (NOTE) The eGFR has been calculated using the CKD EPI equation. This calculation has not been validated in all clinical situations. eGFR's persistently <60 mL/min signify possible Chronic Kidney Disease.    Anion gap 15 5 - 15  CBC WITH DIFFERENTIAL     Status: Abnormal   Collection Time: 01/18/17  3:04 PM  Result Value Ref Range   WBC 6.9 4.0 - 10.5 K/uL   RBC 4.36 3.87 - 5.11 MIL/uL   Hemoglobin 11.1 (L) 12.0 - 15.0 g/dL   HCT 34.5 (L) 36.0 - 46.0 %   MCV 79.1 78.0 - 100.0 fL   MCH 25.5 (L) 26.0 - 34.0 pg   MCHC 32.2 30.0 - 36.0 g/dL   RDW 18.4 (H) 11.5 - 15.5 %   Platelets 202 150 - 400 K/uL    Comment: PLATELET COUNT CONFIRMED BY SMEAR SPECIMEN CHECKED FOR CLOTS    Neutrophils Relative % 76 %   Neutro Abs 5.2 1.7 - 7.7 K/uL   Lymphocytes Relative 15 %   Lymphs Abs 1.0 0.7 - 4.0 K/uL   Monocytes Relative 9 %   Monocytes Absolute 0.6 0.1 - 1.0 K/uL   Eosinophils Relative 0 %   Eosinophils Absolute 0.0 0.0 - 0.7 K/uL   Basophils Relative  0 %   Basophils Absolute 0.0 0.0 - 0.1 K/uL  Blood Culture (routine x 2)     Status: None (Preliminary result)   Collection Time: 01/18/17  3:09 PM  Result Value Ref Range   Specimen Description BLOOD LEFT HAND    Special Requests      BOTTLES DRAWN AEROBIC AND ANAEROBIC Blood Culture adequate volume   Culture PENDING    Report Status PENDING   Blood Culture (routine x 2)  Status: None (Preliminary result)   Collection Time: 01/18/17  3:09 PM  Result Value Ref Range   Specimen Description LEFT ANTECUBITAL    Special Requests      BOTTLES DRAWN AEROBIC AND ANAEROBIC Blood Culture adequate volume   Culture PENDING    Report Status PENDING   CBG monitoring, ED     Status: None   Collection Time: 01/18/17  3:11 PM  Result Value Ref Range   Glucose-Capillary 78 65 - 99 mg/dL  I-Stat CG4 Lactic Acid, ED  (not at  [facility])     Status: Abnormal   Collection Time: 01/18/17  3:20 PM  Result Value Ref Range   Lactic Acid, Venous 3.85 (HH) 0.5 - 1.9 mmol/L  I-stat Chem 8, ED     Status: Abnormal   Collection Time: 01/18/17  3:20 PM  Result Value Ref Range   Sodium 138 135 - 145 mmol/L   Potassium 2.9 (L) 3.5 - 5.1 mmol/L   Chloride 100 (L) 101 - 111 mmol/L   BUN 19 6 - 20 mg/dL   Creatinine, Ser 4.80 (H) 0.44 - 1.00 mg/dL   Glucose, Bld 78 65 - 99 mg/dL   Calcium, Ion 0.99 (L) 1.15 - 1.40 mmol/L   TCO2 23 0 - 100 mmol/L   Hemoglobin 12.6 12.0 - 15.0 g/dL   HCT 37.0 36.0 - 46.0 %  CBG monitoring, ED     Status: Abnormal   Collection Time: 01/18/17  5:23 PM  Result Value Ref Range   Glucose-Capillary 57 (L) 65 - 99 mg/dL  CBG monitoring, ED     Status: Abnormal   Collection Time: 01/18/17  6:04 PM  Result Value Ref Range   Glucose-Capillary 105 (H) 65 - 99 mg/dL  I-Stat CG4 Lactic Acid, ED  (not at  [facility])     Status: Abnormal   Collection Time: 01/18/17  6:19 PM  Result Value Ref Range   Lactic Acid, Venous 4.45 (HH) 0.5 - 1.9 mmol/L  CBG monitoring, ED     Status: None    Collection Time: 01/18/17  7:31 PM  Result Value Ref Range   Glucose-Capillary 66 65 - 99 mg/dL  CBG monitoring, ED     Status: None   Collection Time: 01/18/17  8:50 PM  Result Value Ref Range   Glucose-Capillary 94 65 - 99 mg/dL  Glucose, capillary     Status: None   Collection Time: 01/18/17  9:13 PM  Result Value Ref Range   Glucose-Capillary 94 65 - 99 mg/dL   Comment 1 Notify RN    Comment 2 Document in Chart   Glucose, capillary     Status: None   Collection Time: 01/19/17  2:23 AM  Result Value Ref Range   Glucose-Capillary 85 65 - 99 mg/dL   Comment 1 Notify RN    Comment 2 Document in Chart     Ct Abdomen Pelvis Wo Contrast  Result Date: 01/18/2017 CLINICAL DATA:  Altered mental status after dialysis today, history uterine cancer, end-stage renal disease, hypertension EXAM: CT CHEST, ABDOMEN AND PELVIS WITHOUT CONTRAST TECHNIQUE: Multidetector CT imaging of the chest, abdomen and pelvis was performed following the standard protocol without IV contrast. Sagittal and coronal MPR images reconstructed from axial data set. Oral contrast was not administered. COMPARISON:  CT abdomen and pelvis 11/20/2016 FINDINGS: CT CHEST FINDINGS Cardiovascular: Atherosclerotic calcifications aorta, coronary arteries and proximal great vessels. Minimal pericardial thickening or fluid. Mediastinum/Nodes: Esophagus unremarkable. Base of cervical region normal appearance. No  thoracic adenopathy. Lungs/Pleura: Minimal scattered atelectasis in lower lungs. No acute infiltrate, pleural effusion, or pneumothorax. Musculoskeletal: Bones demineralized. CT ABDOMEN PELVIS FINDINGS Hepatobiliary: Gallbladder surgically absent. No focal hepatic abnormalities. Pancreas: Normal appearance Spleen: Normal appearance Adrenals/Urinary Tract: Unremarkable adrenal glands. BILATERAL renal cortical atrophy and small renal cysts. No hydronephrosis or hydroureter. Bladder decompressed. Stomach/Bowel: Appendix surgically absent  by history. Diverticulosis of descending and sigmoid colon without diverticulitis changes. Stomach and bowel loops otherwise normal appearance. Vascular/Lymphatic: Atherosclerotic calcifications. Aorta normal caliber. Scattered pelvic phleboliths. No adenopathy. Reproductive: Uterus surgically absent with nonvisualization of ovaries Other: Umbilical hernia containing fat.  No free air or free fluid. Musculoskeletal: Cannulated screws proximal LEFT femur. Diffuse osseous demineralization. IMPRESSION: No significant intrathoracic abnormalities. Distal colonic diverticulosis without evidence of diverticulitis. Atrophic kidneys with BILATERAL renal cysts. Umbilical hernia containing fat. Coronary artery calcifications. Aortic Atherosclerosis (ICD10-I70.0). Electronically Signed   By: Lavonia Dana M.D.   On: 01/18/2017 19:48   Ct Head Wo Contrast  Result Date: 01/18/2017 CLINICAL DATA:  66 y/o  F; altered mental status. EXAM: CT HEAD WITHOUT CONTRAST TECHNIQUE: Contiguous axial images were obtained from the base of the skull through the vertex without intravenous contrast. COMPARISON:  11/19/2016 CT of head. FINDINGS: Brain: No evidence of acute infarction, hemorrhage, hydrocephalus, extra-axial collection or mass lesion/mass effect. Stable advanced chronic microvascular ischemic changes of white matter moderate brain parenchymal volume loss. Portable chronic lacunar infarcts within the basal ganglia prominent within the thalamus are stable from prior study. Vascular: Calcific atherosclerosis of carotid siphons and vertebral arteries. 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 Sinuses/Orbits: No acute finding. Other: None. IMPRESSION: 1. No acute intracranial abnormality identified. 2. Stable advanced chronic microvascular ischemic changes and moderate parenchymal volume loss of the brain. 3. Chronic meningoencephalocele extending into the left lateral recess of  sphenoid sinus similar to prior study. Electronically Signed   By: Kristine Garbe M.D.   On: 01/18/2017 19:38   Ct Chest Wo Contrast  Result Date: 01/18/2017 CLINICAL DATA:  Altered mental status after dialysis today, history uterine cancer, end-stage renal disease, hypertension EXAM: CT CHEST, ABDOMEN AND PELVIS WITHOUT CONTRAST TECHNIQUE: Multidetector CT imaging of the chest, abdomen and pelvis was performed following the standard protocol without IV contrast. Sagittal and coronal MPR images reconstructed from axial data set. Oral contrast was not administered. COMPARISON:  CT abdomen and pelvis 11/20/2016 FINDINGS: CT CHEST FINDINGS Cardiovascular: Atherosclerotic calcifications aorta, coronary arteries and proximal great vessels. Minimal pericardial thickening or fluid. Mediastinum/Nodes: Esophagus unremarkable. Base of cervical region normal appearance. No thoracic adenopathy. Lungs/Pleura: Minimal scattered atelectasis in lower lungs. No acute infiltrate, pleural effusion, or pneumothorax. Musculoskeletal: Bones demineralized. CT ABDOMEN PELVIS FINDINGS Hepatobiliary: Gallbladder surgically absent. No focal hepatic abnormalities. Pancreas: Normal appearance Spleen: Normal appearance Adrenals/Urinary Tract: Unremarkable adrenal glands. BILATERAL renal cortical atrophy and small renal cysts. No hydronephrosis or hydroureter. Bladder decompressed. Stomach/Bowel: Appendix surgically absent by history. Diverticulosis of descending and sigmoid colon without diverticulitis changes. Stomach and bowel loops otherwise normal appearance. Vascular/Lymphatic: Atherosclerotic calcifications. Aorta normal caliber. Scattered pelvic phleboliths. No adenopathy. Reproductive: Uterus surgically absent with nonvisualization of ovaries Other: Umbilical hernia containing fat.  No free air or free fluid. Musculoskeletal: Cannulated screws proximal LEFT femur. Diffuse osseous demineralization. IMPRESSION: No significant  intrathoracic abnormalities. Distal colonic diverticulosis without evidence of diverticulitis. Atrophic kidneys with BILATERAL renal cysts. Umbilical hernia containing fat. Coronary artery calcifications. Aortic Atherosclerosis (ICD10-I70.0). Electronically Signed   By: Lavonia Dana M.D.   On:  01/18/2017 19:48   Dg Chest Port 1 View  Result Date: 01/18/2017 CLINICAL DATA:  Altered mental status. EXAM: PORTABLE CHEST 1 VIEW COMPARISON:  09/11/2015 FINDINGS: The heart size and mediastinal contours are within normal limits. Both lungs are clear. The visualized skeletal structures are unremarkable. IMPRESSION: No active disease. Electronically Signed   By: Kathreen Devoid   On: 01/18/2017 14:44    Review of Systems  Constitutional: Negative for chills and fever.  HENT: Negative for congestion.   Respiratory: Negative for shortness of breath.   Cardiovascular: Negative for orthopnea and leg swelling.  Gastrointestinal: Negative for diarrhea, nausea and vomiting.   Blood pressure (!) 92/44, pulse 92, temperature 98.8 F (37.1 C), temperature source Oral, resp. rate 18, height _0  (1.651 m), weight 62.1 kg (136 lb 13.9 oz), SpO2 100 %. Physical Exam  Assessment/Plan: Problem #1 hypoglycemia: Etiology not clear. Patient denies taking any hypoglycemic agent. She usually doesn't eat breakfast before coming to dialysis unit. Presently she feels better. Problem #2 confusion: Most likely secondary to her hypoglycemic episode. Presently she is oriented to person and place but not to time. Problem #3 end-stage renal disease: Patient has this moment does not have any nausea or vomiting. Appetite is good and she is ready to eat. Problem #4 hypertension: Her blood pressure seems to be somewhat low normal for her. Problem #5 hypokalemia: Most likely post dialysis Problem #6 anemia: Her hemoglobin is within our target goal. Problem #7 bone and mineral disorder: Her calcium is a range. Plan: 1] Patient doesn't  require dialysis today 2] her dialysis will be tomorrow which is her regular schedule. 3] we'll check her renal panel and CBC in the morning. 4] DC nifedipine and decrease albuterol to 200 mg by mouth twice a day  [facility] S 01/19/2017, 7:26 AM

## 2017-01-19 NOTE — Progress Notes (Signed)
Patient refused PM meds. Patient repeatedly stated that she does not take meds "this early" and "this late". Patient refused to drink any fluids. Patient confused, could not state where she was or even that she was in a hospital. Patient could not state month or year. Patient states her name and month of her birth date. Patient agitated with care. Patient did not want IV in. This RN wrapped IV to prevent patient from pulling out. Patient has not pulled at IV since. Patient refusing SCD's. When education attempted, patient stated that she did not have blood clots in her legs. Bed alarm on.

## 2017-01-19 NOTE — Progress Notes (Signed)
Pharmacy Antibiotic Note  Sonya [name] is a 66 y.o. female admitted on 01/18/2017 with sepsis.  Pharmacy has been consulted for Vancomycin and levaquin dosing. HD on T,Th, and Sat  Plan: Levaquin IV q48h Vancomycin 750mg  IV after HD F/U cxs and clinical progress Monitor V/S, labs and levels as indicated  Height: 5\' 5"  (165.1 cm) Weight: 136 lb 13.9 oz (62.1 kg) IBW/kg (Calculated) : 57  Temp (24hrs), Avg:99.3 F (37.4 C), Min:98.7 F (37.1 C), Max:100.4 F (38 C)   Recent Labs Lab 01/18/17 1504 01/18/17 1520 01/18/17 1819  WBC 6.9  --   --   CREATININE 4.70* 4.80*  --   LATICACIDVEN  --  3.85* 4.45*    Estimated Creatinine Clearance: 10.2 mL/min (A) (by C-G formula based on SCr of 4.8 mg/dL (H)).    Allergies  Allergen Reactions  . Cephalexin Itching    Antimicrobials this admission: Vancomycin 6/19 >>  Levaquin 6/19 >> Aztreonam 2 gm IV x 1 6/19  Dose adjustments this admission: N/A  Microbiology results: 6/19 BCx: pending 6/20 MRSA PCR: neg  Thank you for allowing pharmacy to be a part of this patient's care.  Isac Sarna, BS Pharm D, [place] Clinical Pharmacist Pager [phone] 01/19/2017 8:36 AM

## 2017-01-19 NOTE — Evaluation (Signed)
Physical Therapy Evaluation Patient Details Name: Sonya [name] MRN: [id number] DOB: March 27, 1951 Today's Date: 01/19/2017   History of Present Illness  Sonya [name] is a 66yo black female who comes to [facility] after AMS and sustained hypoglycemia, with poor PO intake. PMH: ESRD on HD T/R/Sa, Lt hip ORIF (Oct 2016), ICH, CVA, vertigo, and vertical diplopia. At baseline, pt is quite mobiile, tolerating limited community distance with SPC, no falls in past 3 months. Husband and daughter provide tranportation, and she is independent in ADL.   Clinical Impression  Pt admitted with above diagnosis. Pt currently with functional limitations due to the deficits listed below (see "PT Problem List"). Upon entry, the patient is received semirecumbent in bed, daughter present. The pt is awake and agreeable to participate. No acute distress noted at this time. Most recent labs reveal K+: 2.90, BG: WNL, elevated BN and LA. The pt is alert and oriented x3, pleasant, conversational, and following simple and multi-step commands consistently. Functional mobility assessment demonstrates moderate weakness, the patient tolerating distance of <180feet due to weakness in legs, having to stop and rest in hallway due to progressive BLE instability. The patient at baseline is able to tolerate distances of 500-1044ft c SPC. The patient is at high risk for falls as evidence by gait speed <1.35m/s, forward reach <5", and gait instability demonstrated throughout session. Pt will benefit from skilled PT intervention to increase independence and safety with basic mobility in preparation for discharge to the venue listed below.       Follow Up Recommendations Home health PT    Equipment Recommendations  None recommended by PT    Recommendations for Other Services       Precautions / Restrictions Precautions Precaution Comments: no score available; balance deficits at baseline      Mobility  Bed Mobility Overal bed mobility:  Needs Assistance Bed Mobility: Supine to Sit     Supine to sit: Supervision        Transfers Overall transfer level: Needs assistance Equipment used: 1 person hand held assist Transfers: Sit to/from Stand Sit to Stand: Supervision         General transfer comment: needs AD   Ambulation/Gait Ambulation/Gait assistance: Min assist Ambulation Distance (Feet): 80 Feet (107ft) Assistive device: 1 person hand held assist   Gait velocity: very slow, unsteady, furniture reaching.    General Gait Details: multiple trials for exercise, requriing rest between due to BLE fatigue in legs.   Stairs            Wheelchair Mobility    Modified Rankin (Stroke Patients Only)       Balance Overall balance assessment: History of Falls;Needs assistance Sitting-balance support: Feet supported;No upper extremity supported Sitting balance-Leahy Scale: Good     Standing balance support: During functional activity;Single extremity supported Standing balance-Leahy Scale: Fair                               Pertinent Vitals/Pain Pain Assessment: No/denies pain    Home Living Family/patient expects to be discharged to:: Private residence Living Arrangements: Spouse/significant other;Children Available Help at Discharge: Family;Available 24 hours/day Type of Home: House Home Access: Stairs to enter Entrance Stairs-Rails: Right Entrance Stairs-Number of Steps: 1 Home Layout: One level Home Equipment: Walker - 2 wheels;Cane - single point;Wheelchair - manual      Prior Function Level of Independence: Independent with assistive device(s)  Comments: husband/daughter assist with transportation; [place] in LUE      Hand Dominance        Extremity/Trunk Assessment        Lower Extremity Assessment Lower Extremity Assessment: Generalized weakness;Overall WFL for tasks assessed       Communication   Communication: No difficulties  Cognition  Arousal/Alertness: Awake/alert Behavior During Therapy: WFL for tasks assessed/performed Overall Cognitive Status: Within Functional Limits for tasks assessed                                        General Comments      Exercises Other Exercises Other Exercises: Multipl gait trials for exercise: 71ft, 44ft, 21ft, 23ft, with rest between. Increasingly weak and unsteady.    Assessment/Plan    PT Assessment Patient needs continued PT services  PT Problem List Decreased strength;Decreased activity tolerance;Decreased balance;Decreased mobility       PT Treatment Interventions DME instruction;Functional mobility training;Therapeutic activities;Therapeutic exercise;Balance training    PT Goals (Current goals can be found in the Care Plan section)  Acute Rehab PT Goals Patient Stated Goal: regain activity tolerance to community distance AMB  PT Goal Formulation: With patient Time For Goal Achievement: 02/02/17 Potential to Achieve Goals: Good    Frequency Min 2X/week   Barriers to discharge        Co-evaluation               AM-PAC PT "6 Clicks" Daily Activity  Outcome Measure Difficulty turning over in bed (including adjusting bedclothes, sheets and blankets)?: A Little Difficulty moving from lying on back to sitting on the side of the bed? : A Little Difficulty sitting down on and standing up from a chair with arms (e.g., wheelchair, bedside commode, etc,.)?: A Little Help needed moving to and from a bed to chair (including a wheelchair)?: A Lot Help needed walking in hospital room?: A Lot Help needed climbing 3-5 steps with a railing? : A Lot 6 Click Score: 15    End of Session Equipment Utilized During Treatment: Gait belt Activity Tolerance: Patient limited by fatigue;No increased pain Patient left: with call bell/phone within reach;in bed;with family/visitor present (eating lunch ) Nurse Communication: Mobility status;Other (comment) PT Visit  Diagnosis: Unsteadiness on feet (R26.81);Muscle weakness (generalized) (M62.81);Difficulty in walking, not elsewhere classified (R26.2)    Time: 3976-7341 PT Time Calculation (min) (ACUTE ONLY): 12 min   Charges:   PT Evaluation $PT Eval Moderate Complexity: 1 Procedure     PT G Codes:        12:20 PM, 01/25/2017 Etta Grandchild, PT, DPT Physical Therapist - [place] [phone] (Office)   Buccola,Allan C 01-25-2017, 12:17 PM

## 2017-01-19 NOTE — Progress Notes (Signed)
Pt alert and oriented upon assessment. Pt refusing Heparin shot. Educated on DVT prophylaxis and pt stated she didn't want the shot because she is getting up and out of bed to bathroom. RN witnessed pt up to BR with no problem. Pts BP 122/68, refusing blood pressure medication d/t her BP dropping to low 90s earlier in the shift. Pt stated she would take it in the morning.

## 2017-01-19 NOTE — Progress Notes (Signed)
Initial Nutrition Assessment  DOCUMENTATION CODES:  Non-severe (moderate) malnutrition in context of chronic illness  INTERVENTION:  Magic cup BID with meals, each supplement provides 290 kcal and 9 grams of protein  No Milk.   Is adamant that all oral supplements cause diarrhea  NUTRITION DIAGNOSIS:  Malnutrition (Moderate) related to poor appetite, nausea, chronic illness (ESRD on HD) as evidenced by mild depletion of muscle and moderate depletion of body fat.  GOAL:  Patient will meet greater than or equal to 90% of their needs  MONITOR:  PO intake, Supplement acceptance, Labs, Weight trends, I & O's  REASON FOR ASSESSMENT:  Consult Assessment of nutrition requirement/status  ASSESSMENT:  66 y/o female PMHx ESRD on HD, ICH, Dyshagia, CVA, HTN, GERD, Anemia. Presented after becoming confused shortly after her HD session. Worked up for hypoglycemia and low grade fever. Patient had refused to ear all day. Admitted for management.   Assessment largely limited due to confusion as she often contradicted herself or made incorrect/contradictory statements-(said she had been on HD only 1 year)  She reports poor intake x3-4 weeks (later stated 3-4 months). She says she couldn't eat because she would get nauseated. She later states she ate 3 meals a day. She did not follow the renal diet at home, because she says there are certain foods she just cant eat. She says she only can eat what doesn't maker her sick. When RD asked for specific foods she cannot eat, should could not name them. She took vitamins, but didn't know what they were.   RD asked her what her normal weight is, but she did not know, however she says she has lost a large amount of wt in these past few months. If she had to guess, she would say she was 150 when she lost her appetite. Per chart, her weight is highly variable, but has typically been 130-150 lbs for quite some time.   At this time, she refuses all supplement and  is adamant that all supplements cause her diarrhea. She said she will have diarrhea after drinking Nepro or Ensure. She recently tried a fruit drink (resource breeze?). She said she initially tolerated this, but then had diarrhea from it a week later. RD made a few food recommendations. She was agreeable to [employer]  Physical Exam: Mild to moderate muscle and fat wasting. No discernible edema.   Labs No recent phos, K low, BGs 65-95, albumin: 3.4 MEDS: Xanax, Sensipar, Nepro, ppi, Senna, Renvela,    Recent Labs Lab 01/18/17 1504 01/18/17 1520  NA 136 138  K 2.9* 2.9*  CL 98* 100*  CO2 23  --   BUN 20 19  CREATININE 4.70* 4.80*  CALCIUM 8.5*  --   GLUCOSE 81 78     Diet Order:  Diet renal with fluid restriction Fluid restriction: 1200 mL Fluid; Room service appropriate? Yes; Fluid consistency: Thin  Skin:  Reviewed, no issues  Last BM:  6/18  Height:  Ht Readings from Last 1 Encounters:  01/18/17 5\' 5"  (1.651 m)   Weight:  Wt Readings from Last 1 Encounters:  01/19/17 136 lb 13.9 oz (62.1 kg)   Wt Readings from Last 10 Encounters:  01/19/17 136 lb 13.9 oz (62.1 kg)  12/29/16 134 lb 3.2 oz (60.9 kg)  12/17/16 126 lb (57.2 kg)  12/07/16 150 lb (68 kg)  11/25/16 148 lb (67.1 kg)  11/20/16 148 lb 13 oz (67.5 kg)  05/12/16 154 lb 6.4 oz (70 kg)  01/02/16  127 lb (57.6 kg)  12/01/15 127 lb (57.6 kg)  09/11/15 150 lb (68 kg)   Ideal Body Weight:  56.82 kg  BMI:  Body mass index is 22.78 kg/m.  Standard Body weight for age: 58 kg Estimated Nutritional Needs:  Kcal:  1800-2000 kcals (30-33 kcal/kg sbw) Protein:  78-90g Pro 91.3-1.5 g/kg SBW) Fluid:  <1.2 Liters  EDUCATION NEEDS:  No education needs identified at this time  Burtis Junes RD, LDN, CNSC Clinical Nutrition Pager: [phone] 01/19/2017 4:12 PM

## 2017-01-20 DIAGNOSIS — E440 Moderate protein-calorie malnutrition: Secondary | ICD-10-CM

## 2017-01-20 LAB — RENAL FUNCTION PANEL
Albumin: 2.7 g/dL — ABNORMAL LOW (ref 3.5–5.0)
Anion gap: 12 (ref 5–15)
BUN: 30 mg/dL — AB (ref 6–20)
CHLORIDE: 99 mmol/L — AB (ref 101–111)
CO2: 21 mmol/L — ABNORMAL LOW (ref 22–32)
Calcium: 8.2 mg/dL — ABNORMAL LOW (ref 8.9–10.3)
Creatinine, Ser: 7.68 mg/dL — ABNORMAL HIGH (ref 0.44–1.00)
GFR calc Af Amer: 6 mL/min — ABNORMAL LOW (ref >60)
GFR, EST NON AFRICAN AMERICAN: 5 mL/min — AB (ref >60)
Glucose, Bld: 67 mg/dL (ref 65–99)
Phosphorus: 4.2 mg/dL (ref 2.5–4.6)
Potassium: 4.7 mmol/L (ref 3.5–5.1)
Sodium: 132 mmol/L — ABNORMAL LOW (ref 135–145)

## 2017-01-20 LAB — CBC
HEMATOCRIT: 33.8 % — AB (ref 36.0–46.0)
Hemoglobin: 10.9 g/dL — ABNORMAL LOW (ref 12.0–15.0)
MCH: 25.7 pg — ABNORMAL LOW (ref 26.0–34.0)
MCHC: 32.2 g/dL (ref 30.0–36.0)
MCV: 79.7 fL (ref 78.0–100.0)
Platelets: 191 10*3/uL (ref 150–400)
RBC: 4.24 MIL/uL (ref 3.87–5.11)
RDW: 18.5 % — AB (ref 11.5–15.5)
WBC: 4.8 10*3/uL (ref 4.0–10.5)

## 2017-01-20 LAB — GLUCOSE, CAPILLARY
GLUCOSE-CAPILLARY: 86 mg/dL (ref 65–99)
GLUCOSE-CAPILLARY: 77 mg/dL (ref 65–99)
Glucose-Capillary: 72 mg/dL (ref 65–99)

## 2017-01-20 MED ORDER — SODIUM CHLORIDE 0.9 % IV SOLN: 100.0000 mL | INTRAVENOUS | Status: DC | PRN

## 2017-01-20 MED ORDER — LIDOCAINE HCL (PF) 1 % IJ SOLN: 5.0000 mL | INTRAMUSCULAR | Status: DC | PRN

## 2017-01-20 MED ORDER — LIDOCAINE-PRILOCAINE 2.5-2.5 % EX CREA: 1.0000 "application " | TOPICAL_CREAM | CUTANEOUS | Status: DC | PRN

## 2017-01-20 MED ORDER — PENTAFLUOROPROP-TETRAFLUOROETH EX AERO: 1.0000 "application " | INHALATION_SPRAY | CUTANEOUS | Status: DC | PRN

## 2017-01-20 MED ORDER — PANTOPRAZOLE SODIUM 40 MG PO TBEC: 40.0000 mg | DELAYED_RELEASE_TABLET | Freq: Two times a day (BID) | ORAL | 0 refills | Status: DC

## 2017-01-20 NOTE — Care Management Important Message (Signed)
Important Message  Patient Details  Name: Sonya [name] MRN: [id number] Date of Birth: [birth year]   Medicare Important Message Given:  Yes    Sherald Barge, RN 01/20/2017, 11:10 AM

## 2017-01-20 NOTE — Consult Note (Signed)
   [facility] CM Inpatient Consult   01/20/2017  [name] 1951/04/28 [id number]    [facility] Care Management referral received.   Telephone call made to speak with patient via phone about [facility] Management program services.   Mrs. Waddington states she does not need any additional follow up with [facility] Care Management. States " I know how to take my medications and know what to do. I just really need someone to do some house work".  Declined [facility] Care Management program services.  Voicemail left for inpatient RNCM to make aware of above.   Marthenia Rolling, MSN-Ed, RN,BSN [facility] Liaison [phone]

## 2017-01-20 NOTE — Progress Notes (Signed)
Subjective: Interval History: has no complaint of nausea or vomiting. She denies also any difficulty breathing. She states that she is feeling good and no new issues..  Objective: Vital signs in last 24 hours: Temp:  [97.8 F (36.6 C)-98.5 F (36.9 C)] 98.3 F (36.8 C) (06/21 0540) Pulse Rate:  [81-87] 82 (06/21 0540) Resp:  [18] 18 (06/21 0540) BP: (93-122)/(53-68) 119/61 (06/21 0540) SpO2:  [98 %-100 %] 100 % (06/21 0540) Weight:  [61.8 kg (136 lb 3.9 oz)] 61.8 kg (136 lb 3.9 oz) (06/21 0540) Weight change: 1.018 kg (2 lb 3.9 oz)  Intake/Output from previous day: 06/20 0701 - 06/21 0700 In: 100 [P.O.:100] Out: -  Intake/Output this shift: No intake/output data recorded.  Generally patient is alert and in no apparent distress Chest is clear to auscultation Heart exam revealed regular rate and rhythm no murmur. Extremities no edema  Lab Results:  Recent Labs  01/18/17 1504 01/18/17 1520 01/20/17 0407  WBC 6.9  --  4.8  HGB 11.1* 12.6 10.9*  HCT 34.5* 37.0 33.8*  PLT 202  --  191   BMET:  Recent Labs  01/19/17 1537 01/20/17 0407  NA 132* 132*  K 4.5 4.7  CL 97* 99*  CO2 24 21*  GLUCOSE 97 67  BUN 26* 30*  CREATININE 6.88* 7.68*  CALCIUM 8.3* 8.2*   No results for input(s): PTH in the last 72 hours. Iron Studies: No results for input(s): IRON, TIBC, TRANSFERRIN, FERRITIN in the last 72 hours.  Studies/Results: Ct Abdomen Pelvis Wo Contrast  Result Date: 01/18/2017 CLINICAL DATA:  Altered mental status after dialysis today, history uterine cancer, end-stage renal disease, hypertension EXAM: CT CHEST, ABDOMEN AND PELVIS WITHOUT CONTRAST TECHNIQUE: Multidetector CT imaging of the chest, abdomen and pelvis was performed following the standard protocol without IV contrast. Sagittal and coronal MPR images reconstructed from axial data set. Oral contrast was not administered. COMPARISON:  CT abdomen and pelvis 11/20/2016 FINDINGS: CT CHEST FINDINGS Cardiovascular:  Atherosclerotic calcifications aorta, coronary arteries and proximal great vessels. Minimal pericardial thickening or fluid. Mediastinum/Nodes: Esophagus unremarkable. Base of cervical region normal appearance. No thoracic adenopathy. Lungs/Pleura: Minimal scattered atelectasis in lower lungs. No acute infiltrate, pleural effusion, or pneumothorax. Musculoskeletal: Bones demineralized. CT ABDOMEN PELVIS FINDINGS Hepatobiliary: Gallbladder surgically absent. No focal hepatic abnormalities. Pancreas: Normal appearance Spleen: Normal appearance Adrenals/Urinary Tract: Unremarkable adrenal glands. BILATERAL renal cortical atrophy and small renal cysts. No hydronephrosis or hydroureter. Bladder decompressed. Stomach/Bowel: Appendix surgically absent by history. Diverticulosis of descending and sigmoid colon without diverticulitis changes. Stomach and bowel loops otherwise normal appearance. Vascular/Lymphatic: Atherosclerotic calcifications. Aorta normal caliber. Scattered pelvic phleboliths. No adenopathy. Reproductive: Uterus surgically absent with nonvisualization of ovaries Other: Umbilical hernia containing fat.  No free air or free fluid. Musculoskeletal: Cannulated screws proximal LEFT femur. Diffuse osseous demineralization. IMPRESSION: No significant intrathoracic abnormalities. Distal colonic diverticulosis without evidence of diverticulitis. Atrophic kidneys with BILATERAL renal cysts. Umbilical hernia containing fat. Coronary artery calcifications. Aortic Atherosclerosis (ICD10-I70.0). Electronically Signed   By: Lavonia Dana M.D.   On: 01/18/2017 19:48   Ct Head Wo Contrast  Result Date: 01/18/2017 CLINICAL DATA:  66 y/o  F; altered mental status. EXAM: CT HEAD WITHOUT CONTRAST TECHNIQUE: Contiguous axial images were obtained from the base of the skull through the vertex without intravenous contrast. COMPARISON:  11/19/2016 CT of head. FINDINGS: Brain: No evidence of acute infarction, hemorrhage,  hydrocephalus, extra-axial collection or mass lesion/mass effect. Stable advanced chronic microvascular ischemic changes of white matter moderate brain  parenchymal volume loss. Portable chronic lacunar infarcts within the basal ganglia prominent within the thalamus are stable from prior study. Vascular: Calcific atherosclerosis of carotid siphons and vertebral arteries. 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 Sinuses/Orbits: No acute finding. Other: None. IMPRESSION: 1. No acute intracranial abnormality identified. 2. Stable advanced chronic microvascular ischemic changes and moderate parenchymal volume loss of the brain. 3. 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 Electronically Signed   By: Kristine Garbe M.D.   On: 01/18/2017 19:38   Ct Chest Wo Contrast  Result Date: 01/18/2017 CLINICAL DATA:  Altered mental status after dialysis today, history uterine cancer, end-stage renal disease, hypertension EXAM: CT CHEST, ABDOMEN AND PELVIS WITHOUT CONTRAST TECHNIQUE: Multidetector CT imaging of the chest, abdomen and pelvis was performed following the standard protocol without IV contrast. Sagittal and coronal MPR images reconstructed from axial data set. Oral contrast was not administered. COMPARISON:  CT abdomen and pelvis 11/20/2016 FINDINGS: CT CHEST FINDINGS Cardiovascular: Atherosclerotic calcifications aorta, coronary arteries and proximal great vessels. Minimal pericardial thickening or fluid. Mediastinum/Nodes: Esophagus unremarkable. Base of cervical region normal appearance. No thoracic adenopathy. Lungs/Pleura: Minimal scattered atelectasis in lower lungs. No acute infiltrate, pleural effusion, or pneumothorax. Musculoskeletal: Bones demineralized. CT ABDOMEN PELVIS FINDINGS Hepatobiliary: Gallbladder surgically absent. No focal hepatic abnormalities. Pancreas: Normal  appearance Spleen: Normal appearance Adrenals/Urinary Tract: Unremarkable adrenal glands. BILATERAL renal cortical atrophy and small renal cysts. No hydronephrosis or hydroureter. Bladder decompressed. Stomach/Bowel: Appendix surgically absent by history. Diverticulosis of descending and sigmoid colon without diverticulitis changes. Stomach and bowel loops otherwise normal appearance. Vascular/Lymphatic: Atherosclerotic calcifications. Aorta normal caliber. Scattered pelvic phleboliths. No adenopathy. Reproductive: Uterus surgically absent with nonvisualization of ovaries Other: Umbilical hernia containing fat.  No free air or free fluid. Musculoskeletal: Cannulated screws proximal LEFT femur. Diffuse osseous demineralization. IMPRESSION: No significant intrathoracic abnormalities. Distal colonic diverticulosis without evidence of diverticulitis. Atrophic kidneys with BILATERAL renal cysts. Umbilical hernia containing fat. Coronary artery calcifications. Aortic Atherosclerosis (ICD10-I70.0). Electronically Signed   By: Lavonia Dana M.D.   On: 01/18/2017 19:48   Dg Chest Port 1 View  Result Date: 01/18/2017 CLINICAL DATA:  Altered mental status. EXAM: PORTABLE CHEST 1 VIEW COMPARISON:  09/11/2015 FINDINGS: The heart size and mediastinal contours are within normal limits. Both lungs are clear. The visualized skeletal structures are unremarkable. IMPRESSION: No active disease. Electronically Signed   By: Kathreen Devoid   On: 01/18/2017 14:44    I have reviewed the patient's current medications.  Assessment/Plan: Problem #1 hypoglycemia: Is improving but her last blood sugar was 67 low. Patient however is asymptomatic. Etiology for her hypoglycemia is not clear. Possibility of sepsis was entertained but has this moment no source of infection is identified. Problem #2 end-stage renal disease: She is status post hemodialysis on Tuesday. She is due for dialysis today. Patient at this moment doesn't have any uremic  signs and symptoms. Problem #3 bone mineral disorder: Her calcium and phosphorus is range Problem #4 hypertension: Her blood pressure is low normal. Most of her medications has been discontinued. Problem #5 fluid management: No sign of fluid overload Problem #6 anemia: Her hemoglobin is off target goal. Plan: 1] We will make arrangements for patient to get dialysis today. 2] we'll continue his other medications as before.    LOS: 2 days   Mirakle Tomlin S 01/20/2017,7:54 AM

## 2017-01-20 NOTE — Care Management Note (Addendum)
Case Management Note  Patient Details  Name: Sonya [name] MRN: [id number] Date of Birth: 1951/07/29  Expected Discharge Date:  01/20/17               Expected Discharge Plan:  [place]  In-House Referral:  NA  Discharge planning Services  CM Consult  Post Acute Care Choice:  Home Health Choice offered to:  Patient  HH Arranged:  PT, RN [facility] Agency:  [place]  Status of Service:  Completed, signed off   Additional Comments: PT has recommended HH PT and pt agreeable. Pt has chosen [facility] from list of [facility] providers. Pt aware HH has 48hrs to make first visit. Vaughan Basta, [facility] rep, aware of referral and will obtain pt info from chart. Pt to DC home today after HD.  Pt also referred to [facility] CM services due to multiple admission in the past year.   Sherald Barge, RN 01/20/2017, 11:09 AM

## 2017-01-20 NOTE — Progress Notes (Signed)
IV removed, WNL. D/C instructions given to pt. Verbalized understanding. Pt family member at bedside to take home.

## 2017-01-20 NOTE — Procedures (Signed)
    HEMODIALYSIS TREATMENT NOTE:   3 hour heparin-free dialysis completed via right upper arm AVF (16g/antegrade). Goal met: 2 liters removed without interruption in ultrafiltration.  All blood was returned and hemostasis was achieved within 10 minutes.  Rockwell Alexandria, RN, CDN

## 2017-01-20 NOTE — Progress Notes (Signed)
RN adv pt of morning medication Heparin due at 0600am. Pt once again refused the Heparin shot. RN educated pt once again on DVT prophylaxis and importance of preventing DVT. Pt verbalized understanding and once again refused Heparin shot.

## 2017-01-20 NOTE — Discharge Summary (Addendum)
Physician Discharge Summary  Sonya [name] ESP:[id number] DOB: 1951/02/26 DOA: 01/18/2017  PCP: Redmond School, MD  Admit date: 01/18/2017 Discharge date: 01/20/2017  Admitted From: Home  Disposition:  Home   Recommendations for Outpatient Follow-up:  1. Follow up with PCP in 1- week 2. Patient has been placed on pantoprazole bid for dyspepsia and postpandrial vomiting.  Home Health: Yes  Equipment/Devices: No   Discharge Condition: Stable  CODE STATUS: Full  Diet recommendation: Heart Healthy  Brief/Interim Summary: 66 year old female presented with altered mental status. Patient is known to have end-stage renal disease on hemodialysis, and history of intracranial hemorrhage. The day of admission of admission patient did not complete her hemodialysis treatment, had poor oral intake, she developed hypoglycemia. On initial physical examination blood pressure 125/60, heart rate 115, temperature 100.4, respiratory rate 19. Oxygen saturation 100%. She was confused and disoriented, lungs were clear to auscultation, heart S1-S2 present rhythmic, abdomen soft, no lower extremity edema. Sodium 138, potassium 2.9, chloride 100, bicarbonate 23, glucose 78, BUN 19, creatinine 4.0, calcium ionized 0.9, lactic acid 3.8, white count 6.9, hemoglobin 11.1, hematocrit 34.5, platelets 202. Chest x-ray with increased lung markings, no infiltrates, CT of the abdomen and chest with no acute abnormalities. Head CT with no intracranial abnormalities. EKG was sinus rhythm. Patient was admitted to hospital with working diagnosis of persistent hypoglycemia complicated by fever and acute metabolic encephalopathy.  1. Metabolic encephalopathy due to hypoglycemia. She was admitted to the medical ward with a remote telemetry monitor, she was placed on D10 infusion, her diet was advanced with good toleration. She was successfully weaned off dextrose infusion, maintaining capillary glucose at 85, 72, 86, 77. Her mentation  significantly improved. Her hypoglycemia was deemed to be related to poor oral intake and low glycogen storages. There is no evidence of her taking antihyperglycemic agents at home, she will need a close follow-up as an outpatient, home health has been arranged for further glucose monitoring.   2. Fever. Extensive inpatient workup, no signs of systemic infection. Antibiotics were discontinued. Patient has remained afebrile.   3. End-stage renal disease on hemodialysis, with hypokalemia. Patient received hemodialysis before discharge with no further complications potassium was corrected. Continue Sensipar and Sevelamer.   4. Hypertension. Patient on labetalol at home, dose was reduced in hospital to avoid hypotension, will need close follow-up as an outpatient.  5. Depression. No confusion or agitation, patient will continue trazodone, alprazolam and diazepam, will stop meclizine to avoid drug- drug interactions.  6. Hypothyroidism. Continue levothyroxine.  7. Dyspepsia and postprandial vomiting. Patient was placed on pantoprazole twice daily with improvement of her symptoms, will continue twice a day dosing for her next 2 weeks if persistent symptoms she may need further diagnostics as an outpatient.   8. Moderate malnutrition, color protein. She was provided Magic cup twice a day with meals while she was in the hospital. Patient was adamant that oral supplements cause her diarrhea.  Discharge Diagnoses:  Principal Problem:   Hypoglycemia Active Problems:   Essential hypertension   Anemia in chronic kidney disease   ESRD (end stage renal disease) (HCC)   Encephalopathy acute   Malnutrition of moderate degree    Discharge Instructions   Allergies as of 01/20/2017      Reactions   Cephalexin Itching      Medication List    STOP taking these medications   ciprofloxacin 500 MG tablet Commonly known as:  CIPRO   doxycycline 100 MG capsule Commonly known as:  VIBRAMYCIN  meclizine 25 MG tablet Commonly known as:  ANTIVERT   metroNIDAZOLE 500 MG tablet Commonly known as:  FLAGYL   omeprazole 20 MG capsule Commonly known as:  PRILOSEC     TAKE these medications   alendronate 70 MG tablet Commonly known as:  FOSAMAX Take 70 mg by mouth once a week. Take with a full glass of water on an empty stomach.   allopurinol 300 MG tablet Commonly known as:  ZYLOPRIM Take 0.5 tablets (150 mg total) by mouth daily. Dose was decreased secondary to renal failure. What changed:  how much to take  additional instructions   ALPRAZolam 0.5 MG tablet Commonly known as:  XANAX Take 1 tablet (0.5 mg total) by mouth 2 (two) times daily.   cinacalcet 30 MG tablet Commonly known as:  SENSIPAR Take 30 mg by mouth daily.   diazepam 2 MG tablet Commonly known as:  VALIUM Take 2 mg by mouth every 6 (six) hours as needed for anxiety.   DULCOLAX 5 MG EC tablet Generic drug:  bisacodyl Take 5 mg by mouth daily as needed for moderate constipation.   labetalol 200 MG tablet Commonly known as:  NORMODYNE Take 600 mg by mouth 2 (two) times daily.   levothyroxine 50 MCG tablet Commonly known as:  SYNTHROID, LEVOTHROID Take 1 tablet (50 mcg total) by mouth daily.   lidocaine-prilocaine cream Commonly known as:  EMLA Apply 1 application topically every Tuesday, Thursday, and Saturday at 6 PM.   metoCLOPramide 5 MG tablet Commonly known as:  REGLAN 1 po before breakfast.   NIFEdipine 60 MG 24 hr tablet Commonly known as:  PROCARDIA XL/ADALAT-CC Take 60 mg by mouth daily. What changed:  Another medication with the same name was removed. Continue taking this medication, and follow the directions you see here.   pantoprazole 40 MG tablet Commonly known as:  PROTONIX Take 1 tablet (40 mg total) by mouth 2 (two) times daily.   sevelamer carbonate 800 MG tablet Commonly known as:  RENVELA Take 2,400 mg by mouth 3 (three) times daily with meals.        Allergies  Allergen Reactions  . Cephalexin Itching    Consultations:  Nephrology.   Procedures/Studies: Ct Abdomen Pelvis Wo Contrast  Result Date: 01/18/2017 CLINICAL DATA:  Altered mental status after dialysis today, history uterine cancer, end-stage renal disease, hypertension EXAM: CT CHEST, ABDOMEN AND PELVIS WITHOUT CONTRAST TECHNIQUE: Multidetector CT imaging of the chest, abdomen and pelvis was performed following the standard protocol without IV contrast. Sagittal and coronal MPR images reconstructed from axial data set. Oral contrast was not administered. COMPARISON:  CT abdomen and pelvis 11/20/2016 FINDINGS: CT CHEST FINDINGS Cardiovascular: Atherosclerotic calcifications aorta, coronary arteries and proximal great vessels. Minimal pericardial thickening or fluid. Mediastinum/Nodes: Esophagus unremarkable. Base of cervical region normal appearance. No thoracic adenopathy. Lungs/Pleura: Minimal scattered atelectasis in lower lungs. No acute infiltrate, pleural effusion, or pneumothorax. Musculoskeletal: Bones demineralized. CT ABDOMEN PELVIS FINDINGS Hepatobiliary: Gallbladder surgically absent. No focal hepatic abnormalities. Pancreas: Normal appearance Spleen: Normal appearance Adrenals/Urinary Tract: Unremarkable adrenal glands. BILATERAL renal cortical atrophy and small renal cysts. No hydronephrosis or hydroureter. Bladder decompressed. Stomach/Bowel: Appendix surgically absent by history. Diverticulosis of descending and sigmoid colon without diverticulitis changes. Stomach and bowel loops otherwise normal appearance. Vascular/Lymphatic: Atherosclerotic calcifications. Aorta normal caliber. Scattered pelvic phleboliths. No adenopathy. Reproductive: Uterus surgically absent with nonvisualization of ovaries Other: Umbilical hernia containing fat.  No free air or free fluid. Musculoskeletal: Cannulated screws proximal LEFT femur. Diffuse osseous  demineralization. IMPRESSION: No  significant intrathoracic abnormalities. Distal colonic diverticulosis without evidence of diverticulitis. Atrophic kidneys with BILATERAL renal cysts. Umbilical hernia containing fat. Coronary artery calcifications. Aortic Atherosclerosis (ICD10-I70.0). Electronically Signed   By: Lavonia Dana M.D.   On: 01/18/2017 19:48   Ct Head Wo Contrast  Result Date: 01/18/2017 CLINICAL DATA:  66 y/o  F; altered mental status. EXAM: CT HEAD WITHOUT CONTRAST TECHNIQUE: Contiguous axial images were obtained from the base of the skull through the vertex without intravenous contrast. COMPARISON:  11/19/2016 CT of head. FINDINGS: Brain: No evidence of acute infarction, hemorrhage, hydrocephalus, extra-axial collection or mass lesion/mass effect. Stable advanced chronic microvascular ischemic changes of white matter moderate brain parenchymal volume loss. Portable chronic lacunar infarcts within the basal ganglia prominent within the thalamus are stable from prior study. Vascular: Calcific atherosclerosis of carotid siphons and vertebral arteries. 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 Sinuses/Orbits: No acute finding. Other: None. IMPRESSION: 1. No acute intracranial abnormality identified. 2. Stable advanced chronic microvascular ischemic changes and moderate parenchymal volume loss of the brain. 3. 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 Electronically Signed   By: Kristine Garbe M.D.   On: 01/18/2017 19:38   Ct Chest Wo Contrast  Result Date: 01/18/2017 CLINICAL DATA:  Altered mental status after dialysis today, history uterine cancer, end-stage renal disease, hypertension EXAM: CT CHEST, ABDOMEN AND PELVIS WITHOUT CONTRAST TECHNIQUE: Multidetector CT imaging of the chest, abdomen and pelvis was performed following the standard protocol without IV contrast. Sagittal and coronal MPR  images reconstructed from axial data set. Oral contrast was not administered. COMPARISON:  CT abdomen and pelvis 11/20/2016 FINDINGS: CT CHEST FINDINGS Cardiovascular: Atherosclerotic calcifications aorta, coronary arteries and proximal great vessels. Minimal pericardial thickening or fluid. Mediastinum/Nodes: Esophagus unremarkable. Base of cervical region normal appearance. No thoracic adenopathy. Lungs/Pleura: Minimal scattered atelectasis in lower lungs. No acute infiltrate, pleural effusion, or pneumothorax. Musculoskeletal: Bones demineralized. CT ABDOMEN PELVIS FINDINGS Hepatobiliary: Gallbladder surgically absent. No focal hepatic abnormalities. Pancreas: Normal appearance Spleen: Normal appearance Adrenals/Urinary Tract: Unremarkable adrenal glands. BILATERAL renal cortical atrophy and small renal cysts. No hydronephrosis or hydroureter. Bladder decompressed. Stomach/Bowel: Appendix surgically absent by history. Diverticulosis of descending and sigmoid colon without diverticulitis changes. Stomach and bowel loops otherwise normal appearance. Vascular/Lymphatic: Atherosclerotic calcifications. Aorta normal caliber. Scattered pelvic phleboliths. No adenopathy. Reproductive: Uterus surgically absent with nonvisualization of ovaries Other: Umbilical hernia containing fat.  No free air or free fluid. Musculoskeletal: Cannulated screws proximal LEFT femur. Diffuse osseous demineralization. IMPRESSION: No significant intrathoracic abnormalities. Distal colonic diverticulosis without evidence of diverticulitis. Atrophic kidneys with BILATERAL renal cysts. Umbilical hernia containing fat. Coronary artery calcifications. Aortic Atherosclerosis (ICD10-I70.0). Electronically Signed   By: Lavonia Dana M.D.   On: 01/18/2017 19:48   Dg Chest Port 1 View  Result Date: 01/18/2017 CLINICAL DATA:  Altered mental status. EXAM: PORTABLE CHEST 1 VIEW COMPARISON:  09/11/2015 FINDINGS: The heart size and mediastinal contours  are within normal limits. Both lungs are clear. The visualized skeletal structures are unremarkable. IMPRESSION: No active disease. Electronically Signed   By: Kathreen Devoid   On: 01/18/2017 14:44       Subjective: Patient feeling well, no nausea or vomiting, no dyspnea or chest pain, no further abdominal pain or vomiting.  Discharge Exam: Vitals:   01/20/17 0930 01/20/17 1000  BP: 116/63 (!) 113/59  Pulse: 81 84  Resp:    Temp:  Vitals:   01/20/17 0910 01/20/17 0920 01/20/17 0930 01/20/17 1000  BP: 116/64 106/60 116/63 (!) 113/59  Pulse: 84 84 81 84  Resp: 17     Temp: 98.6 F (37 C)     TempSrc: Oral     SpO2: 100%     Weight: 61.9 kg (136 lb 7.4 oz)     Height:        General: Pt is alert, awake, not in acute distress Oral mucosa moist.  Cardiovascular: RRR, S1/S2 +, no rubs, no gallops Respiratory: CTA bilaterally, no wheezing, no rhonchi Abdominal: Soft, NT, ND, bowel sounds + Extremities: no edema, no cyanosis    The results of significant diagnostics from this hospitalization (including imaging, microbiology, ancillary and laboratory) are listed below for reference.     Microbiology: Recent Results (from the past 240 hour(s))  Blood Culture (routine x 2)     Status: None (Preliminary result)   Collection Time: 01/18/17  3:09 PM  Result Value Ref Range Status   Specimen Description BLOOD LEFT HAND  Final   Special Requests   Final    BOTTLES DRAWN AEROBIC AND ANAEROBIC Blood Culture adequate volume   Culture NO GROWTH 2 DAYS  Final   Report Status PENDING  Incomplete  Blood Culture (routine x 2)     Status: None (Preliminary result)   Collection Time: 01/18/17  3:09 PM  Result Value Ref Range Status   Specimen Description LEFT ANTECUBITAL  Final   Special Requests   Final    BOTTLES DRAWN AEROBIC AND ANAEROBIC Blood Culture adequate volume   Culture NO GROWTH 2 DAYS  Final   Report Status PENDING  Incomplete  MRSA PCR Screening     Status: None    Collection Time: 01/19/17  3:00 AM  Result Value Ref Range Status   MRSA by PCR NEGATIVE NEGATIVE Final    Comment:        The GeneXpert MRSA Assay (FDA approved for NASAL specimens only), is one component of a comprehensive MRSA colonization surveillance program. It is not intended to diagnose MRSA infection nor to guide or monitor treatment for MRSA infections.      Labs: BNP (last 3 results) No results for input(s): BNP in the last 8760 hours. Basic Metabolic Panel:  Recent Labs Lab 01/18/17 1504 01/18/17 1520 01/19/17 1537 01/20/17 0407  NA 136 138 132* 132*  K 2.9* 2.9* 4.5 4.7  CL 98* 100* 97* 99*  CO2 23  --  24 21*  GLUCOSE 81 78 97 67  BUN 20 19 26* 30*  CREATININE 4.70* 4.80* 6.88* 7.68*  CALCIUM 8.5*  --  8.3* 8.2*  PHOS  --   --   --  4.2   Liver Function Tests:  Recent Labs Lab 01/18/17 1504 01/20/17 0407  AST 17  --   ALT 9*  --   ALKPHOS 54  --   BILITOT 0.5  --   PROT 7.3  --   ALBUMIN 3.4* 2.7*   No results for input(s): LIPASE, AMYLASE in the last 168 hours. No results for input(s): AMMONIA in the last 168 hours. CBC:  Recent Labs Lab 01/18/17 1504 01/18/17 1520 01/20/17 0407  WBC 6.9  --  4.8  NEUTROABS 5.2  --   --   HGB 11.1* 12.6 10.9*  HCT 34.5* 37.0 33.8*  MCV 79.1  --  79.7  PLT 202  --  191   Cardiac Enzymes: No results for input(s): CKTOTAL, CKMB, CKMBINDEX,  TROPONINI in the last 168 hours. BNP: Invalid input(s): POCBNP CBG:  Recent Labs Lab 01/19/17 1942 01/19/17 2039 01/20/17 0027 01/20/17 0344 01/20/17 0754  GLUCAP 85 85 72 86 77   D-Dimer No results for input(s): DDIMER in the last 72 hours. Hgb A1c No results for input(s): HGBA1C in the last 72 hours. Lipid Profile No results for input(s): CHOL, HDL, LDLCALC, TRIG, CHOLHDL, LDLDIRECT in the last 72 hours. Thyroid function studies No results for input(s): TSH, T4TOTAL, T3FREE, THYROIDAB in the last 72 hours.  Invalid input(s): FREET3 Anemia work  up No results for input(s): VITAMINB12, FOLATE, FERRITIN, TIBC, IRON, RETICCTPCT in the last 72 hours. Urinalysis    Component Value Date/Time   COLORURINE YELLOW 04/14/2015 1427   APPEARANCEUR CLEAR 04/14/2015 1427   LABSPEC 1.020 04/14/2015 1427   PHURINE 8.5 (H) 04/14/2015 1427   GLUCOSEU 100 (A) 04/14/2015 1427   HGBUR SMALL (A) 04/14/2015 1427   BILIRUBINUR NEGATIVE 04/14/2015 1427   KETONESUR NEGATIVE 04/14/2015 1427   PROTEINUR >300 (A) 04/14/2015 1427   UROBILINOGEN 0.2 04/14/2015 1427   NITRITE NEGATIVE 04/14/2015 1427   LEUKOCYTESUR TRACE (A) 04/14/2015 1427   Sepsis Labs Invalid input(s): PROCALCITONIN,  WBC,  LACTICIDVEN Microbiology Recent Results (from the past 240 hour(s))  Blood Culture (routine x 2)     Status: None (Preliminary result)   Collection Time: 01/18/17  3:09 PM  Result Value Ref Range Status   Specimen Description BLOOD LEFT HAND  Final   Special Requests   Final    BOTTLES DRAWN AEROBIC AND ANAEROBIC Blood Culture adequate volume   Culture NO GROWTH 2 DAYS  Final   Report Status PENDING  Incomplete  Blood Culture (routine x 2)     Status: None (Preliminary result)   Collection Time: 01/18/17  3:09 PM  Result Value Ref Range Status   Specimen Description LEFT ANTECUBITAL  Final   Special Requests   Final    BOTTLES DRAWN AEROBIC AND ANAEROBIC Blood Culture adequate volume   Culture NO GROWTH 2 DAYS  Final   Report Status PENDING  Incomplete  MRSA PCR Screening     Status: None   Collection Time: 01/19/17  3:00 AM  Result Value Ref Range Status   MRSA by PCR NEGATIVE NEGATIVE Final    Comment:        The GeneXpert MRSA Assay (FDA approved for NASAL specimens only), is one component of a comprehensive MRSA colonization surveillance program. It is not intended to diagnose MRSA infection nor to guide or monitor treatment for MRSA infections.      Time coordinating discharge: 45 minutes  SIGNED:   Tawni Millers, MD  [facility] 01/20/2017, 10:11 AM Pager   If 7PM-7AM, please contact night-coverage [email or web address] Password [email or web address]

## 2017-01-21 ENCOUNTER — Encounter (HOSPITAL_COMMUNITY): Payer: Self-pay | Admitting: Emergency Medicine

## 2017-01-21 ENCOUNTER — Emergency Department (HOSPITAL_COMMUNITY)
Admission: EM | Admit: 2017-01-21 | Discharge: 2017-01-21 | Disposition: A | Discharge status: Home / Self Care | Payer: Medicare Other | Attending: Emergency Medicine | Admitting: Emergency Medicine

## 2017-01-21 DIAGNOSIS — I129 Hypertensive chronic kidney disease with stage 1 through stage 4 chronic kidney disease, or unspecified chronic kidney disease: Secondary | ICD-10-CM | POA: Diagnosis not present

## 2017-01-21 DIAGNOSIS — R404 Transient alteration of awareness: Secondary | ICD-10-CM | POA: Diagnosis not present

## 2017-01-21 DIAGNOSIS — R42 Dizziness and giddiness: Secondary | ICD-10-CM | POA: Diagnosis present

## 2017-01-21 DIAGNOSIS — N189 Chronic kidney disease, unspecified: Secondary | ICD-10-CM | POA: Insufficient documentation

## 2017-01-21 DIAGNOSIS — E039 Hypothyroidism, unspecified: Secondary | ICD-10-CM | POA: Diagnosis not present

## 2017-01-21 DIAGNOSIS — Z79899 Other long term (current) drug therapy: Secondary | ICD-10-CM | POA: Diagnosis not present

## 2017-01-21 LAB — GLUCOSE, CAPILLARY: GLUCOSE-CAPILLARY: 90 mg/dL (ref 65–99)

## 2017-01-21 LAB — CBG MONITORING, ED: Glucose-Capillary: 96 mg/dL (ref 65–99)

## 2017-01-21 LAB — HEPATITIS B SURFACE ANTIGEN: Hepatitis B Surface Ag: NEGATIVE

## 2017-01-21 NOTE — ED Triage Notes (Signed)
Pt states she was discharged from [facility] yesterday, felt ok when she went to bed, she woke with dizziness, husband states she was "talking out of her head last night" after going to bed

## 2017-01-21 NOTE — Discharge Instructions (Signed)
Make sure that you are eating and drinking regularly.  Make sure that you go to dialysis tomorrow as scheduled.  Follow-up with your doctor for checkup in 1 week.

## 2017-01-21 NOTE — ED Notes (Signed)
Pt ate 100% of breakfast.  Remains with no complaints.

## 2017-01-21 NOTE — ED Notes (Signed)
Pt is alert and oriented and denies any complaints currently.  Did express frustration about being sick and her husband having to take care of her.  States he has stage 4 pancreatic and liver cancer and has taken on the caretaker role for the past few months.  Her daughter has begun to live with them to help out.  Pt states they are coping well, but it is frustrating for her to not be able to do more.  Reports poor appetite and "staring" episodes where she "blanks out" and loses her train of thought.

## 2017-01-21 NOTE — ED Notes (Signed)
Pt provided with breakfast tray.

## 2017-01-21 NOTE — ED Provider Notes (Signed)
[facility] DEPT Provider Note   CSN: [id number] Arrival date & time: 01/21/17  2505     History   Chief Complaint Chief Complaint  Patient presents with  . Dizziness    HPI Sonya [name] is a 66 y.o. female.  She presents for evaluation of altered mental status.  According to her husband was with her yesterday after getting home from the hospital she was not sure what day it was.  This waxed and waned for several hours.  He also felt that she was more restless than usual.  The husband is ill with "stage IV cancer."  He states that this makes it hard for him to help her.  The patient is alert and conversant understands her recent hospitalization, including its recommendations and what she needs to do for follow-up care.  She plans on going to dialysis tomorrow.  Patient denies headache, chest pain, back pain, nausea, vomiting, weakness or dizziness.  There are no other no modifying factors.  HPI  Past Medical History:  Diagnosis Date  . Anemia   . CKD (chronic kidney disease)   . Diverticulitis   . Dysphagia   . ESRD (end stage renal disease) ([place])    On HD  . Fracture of femoral neck, left, closed ([place]) 05/30/2015  . Gastritis 06/2013 & 05/2015  . Gastroesophageal reflux disease with stricture 05/2015  . GERD (gastroesophageal reflux disease)   . HTN (hypertension)   . Hyperparathyroidism due to renal insufficiency ([place])   . Hypertensive urgency 06/2013 & 04/2015  . Hypothyroidism   . ICH (intracerebral hemorrhage) ([place]) 10/2014  . 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 Protein calorie malnutrition ([place])   . Stroke ([place])   . Uterine cancer ([place]) 2012  . Vertical diplopia   . Vertigo     Patient Active Problem List   Diagnosis Date Noted  . Malnutrition of moderate degree 01/19/2017  . Encephalopathy acute 01/18/2017  . Hypoglycemia 01/18/2017  . 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 Colitis, acute 11/20/2016  . ESRD (end stage renal disease) ([place])   . Weakness   .  Abnormal weight loss 12/01/2015  . Stomach growling 12/01/2015  . Pulmonary edema 08/19/2015  . CAP (community acquired pneumonia) 08/19/2015  . Hypothyroidism, adult 06/14/2015  . HA (headache) 06/13/2015  . HTN (hypertension), malignant 06/13/2015  . Elective surgery   . Fracture of femoral neck, left, closed ([place]) 05/30/2015  . Hip fracture ([place]) 05/30/2015  . Closed fracture of neck of left femur ([place])   . Emesis   . Nausea with vomiting 05/13/2015  . Nausea and vomiting 04/14/2015  . UTI (lower urinary tract infection) 04/14/2015  . Orthostatic hypotension 11/27/2014  . Accelerated hypertension 11/25/2014  . Dizziness   . Uncontrolled hypertension   . Acute encephalopathy   . Confusion   . Protein-calorie malnutrition, severe ([place]) 10/15/2014  . Dysphagia 10/15/2014  . Hypertensive emergency 10/14/2014  . Hypertensive urgency, malignant 10/14/2014  . Intracerebral bleed ([place]) 10/14/2014  . End stage renal disease ([place]) 08/31/2013  . Symptomatic anemia 08/18/2013  . Hyperparathyroidism due to renal insufficiency ([place]) 07/09/2013  . Unspecified constipation 06/21/2013  . Gastritis 06/21/2013  . Dizzy 06/20/2013  . Chronic kidney disease 06/20/2013  . Anemia in chronic kidney disease 06/08/2013  . Regurgitation 06/08/2013  . Hypokalemia 06/08/2013  . Gout 06/08/2013  . Other dysphagia 06/07/2013  . Hypertensive urgency 06/07/2013  . Borborygmi 04/08/2011  . Essential hypertension 04/08/2011  . Colon cancer screening 04/08/2011    Past Surgical  History:  Procedure Laterality Date  . ABDOMINAL HYSTERECTOMY    . APPENDECTOMY    . BASCILIC VEIN TRANSPOSITION Right 09/17/2013   Procedure: BRACHIAL VEIN TRANSPOSITION;  Surgeon: Conrad Teresita, MD;  Location: [facility];  Service: Vascular;  Laterality: Right;  . BASCILIC VEIN TRANSPOSITION Right 10/29/2013   Procedure: RIGHT SECOND STAGE BRACHIAL VEIN TRANSPOSITION;  Surgeon: Conrad Centre, MD;  Location: [facility];  Service:  Vascular;  Laterality: Right;  . CESAREAN SECTION    . CHOLECYSTECTOMY    . COLONOSCOPY  2000   TICS, IH  . COLONOSCOPY  2003 NUR BRBPR D50 V6   DC/[place] TICS, IH  . COLONOSCOPY N/A 09/04/2013   SLF: Small ulcer in the descending colon, one colon polyp (tubular adenoma), large internal hemorrhoids, moderate diverticulosis. next tcs 10 years.  . ESOPHAGOGASTRODUODENOSCOPY N/A 05/23/2015   ZLD:JTTS non-erosive gastritis/stricture at the gastroesophageal junction  . ESOPHAGOGASTRODUODENOSCOPY (EGD) WITH ESOPHAGEAL DILATION N/A 06/08/2013   Dr. Fields:Stricture at the gastroesophageal junction/MILD NON-erosive gastritis (inflammation) was found in the gastric antrum; multiple biopsies/NO SOURCE FOR ANEMIA IDETIFIED-MOST LIKELY DUE TO ANEMIA OF CHRONIC DISEASE  . FLEXIBLE SIGMOIDOSCOPY N/A 01/02/2016   Dr. Oneida Alar: external hemorrhoids, diverticulosis, internal hemorrhoids, proximal rectum normal  . GIVENS CAPSULE STUDY N/A 12/03/2013   SLF: occasional gastric erosion. small bowel normal  . HIP PINNING,CANNULATED Left 05/31/2015   Procedure: CANNULATED HIP PINNING;  Surgeon: Leandrew Koyanagi, MD;  Location: [facility];  Service: Orthopedics;  Laterality: Left;  . INSERTION OF DIALYSIS CATHETER Left 09/17/2013   Procedure: INSERTION OF DIALYSIS CATHETER;  Surgeon: Conrad [place], MD;  Location: [facility];  Service: Vascular;  Laterality: Left;  . LAPAROSCOPIC TOTAL HYSTERECTOMY    . NASAL SEPTUM SURGERY    . SAVORY DILATION N/A 05/23/2015   Procedure: SAVORY DILATION;  Surgeon: Danie Binder, MD;  [facility];  Service: Endoscopy;  Laterality: N/A;    OB History    Gravida Para Term Preterm AB Living   1 1 1          SAB TAB Ectopic Multiple Live Births                   Home Medications    Prior to Admission medications   Medication Sig Start Date End Date Taking? Authorizing Provider  alendronate (FOSAMAX) 70 MG tablet Take 70 mg by mouth once a week. Take with a full glass of water on an empty  stomach.    [provider]  allopurinol (ZYLOPRIM) 300 MG tablet Take 0.5 tablets (150 mg total) by mouth daily. Dose was decreased secondary to renal failure. Patient taking differently: Take 300 mg by mouth daily. Dose was decreased secondary to renal failure. 06/16/15   Rexene Alberts, MD  ALPRAZolam Duanne Moron) 0.5 MG tablet Take 1 tablet (0.5 mg total) by mouth 2 (two) times daily. 12/29/16   Fields, Marga Melnick, MD  bisacodyl (DULCOLAX) 5 MG EC tablet Take 5 mg by mouth daily as needed for moderate constipation.    [provider]  cinacalcet (SENSIPAR) 30 MG tablet Take 30 mg by mouth daily.    [provider]  diazepam (VALIUM) 2 MG tablet Take 2 mg by mouth every 6 (six) hours as needed for anxiety.    [provider]  labetalol (NORMODYNE) 200 MG tablet Take 600 mg by mouth 2 (two) times daily.     [provider]  levothyroxine (SYNTHROID, LEVOTHROID) 50 MCG tablet Take 1 tablet (50  mcg total) by mouth daily. 04/17/15   Samuella Cota, MD  lidocaine-prilocaine (EMLA) cream Apply 1 application topically every Tuesday, Thursday, and Saturday at 6 PM.     [provider]  metoCLOPramide (REGLAN) 5 MG tablet 1 po before breakfast. 01/02/16   Fields, Marga Melnick, MD  NIFEdipine (PROCARDIA XL/ADALAT-CC) 60 MG 24 hr tablet Take 60 mg by mouth daily.  04/10/15   [provider]  pantoprazole (PROTONIX) 40 MG tablet Take 1 tablet (40 mg total) by mouth 2 (two) times daily. 01/20/17 02/03/17  Arrien, Jimmy Picket, MD  sevelamer carbonate (RENVELA) 800 MG tablet Take 2,400 mg by mouth 3 (three) times daily with meals.     [provider]    Family History Family History  Problem Relation Age of Onset  . Cancer Mother   . Diabetes Mother   . Hypertension Mother   . Diabetes Father   . Hypertension Father   . Hypertension Sister   . Colon cancer Neg Hx     Social History Social History  Substance Use Topics  . Smoking status:  Never Smoker  . Smokeless tobacco: Never Used  . Alcohol use No     Allergies   Cephalexin   Review of Systems Review of Systems  All other systems reviewed and are negative.    Physical Exam Updated Vital Signs BP 106/62 (BP Location: Left Arm)   Pulse 84   Temp 97.9 F (36.6 C) (Oral)   Resp 18   Ht 5\' 5"  (1.651 m)   Wt 59 kg (130 lb)   SpO2 100%   BMI 21.63 kg/m   Physical Exam  Constitutional: She is oriented to person, place, and time. She appears well-developed. No distress.  Elderly, frail  HENT:  Head: Normocephalic and atraumatic.  Eyes: Conjunctivae and EOM are normal. Pupils are equal, round, and reactive to light.  Neck: Normal range of motion and phonation normal. Neck supple.  Cardiovascular: Normal rate and regular rhythm.   Pulmonary/Chest: Effort normal and breath sounds normal. She exhibits no tenderness.  Abdominal: Soft. She exhibits no distension. There is no tenderness. There is no guarding.  Musculoskeletal: Normal range of motion.  Neurological: She is alert and oriented to person, place, and time. She exhibits normal muscle tone.  Skin: Skin is warm and dry.  Psychiatric: She has a normal mood and affect. Her behavior is normal.  Nursing note and vitals reviewed.    ED Treatments / Results  Labs (all labs ordered are listed, but only abnormal results are displayed) Labs Reviewed  CBG MONITORING, ED    EKG  EKG Interpretation None       Radiology No results found.  Procedures Procedures (including critical care time)  Medications Ordered in ED Medications - No data to display   Initial Impression / Assessment and Plan / ED Course  I have reviewed the triage vital signs and the nursing notes.  Pertinent labs & imaging results that were available during my care of the patient were reviewed by me and considered in my medical decision making (see chart for details).      Patient Vitals for the past 24 hrs:  BP Temp  Temp src Pulse Resp SpO2 Height Weight  01/21/17 0643 106/62 97.9 F (36.6 C) Oral 84 18 100 % - -  01/21/17 1610 - - - - - - 5\' 5"  (1.651 m) 59 kg (130 lb)    7:45 AM Reevaluation with update and discussion. After  initial assessment and treatment, an updated evaluation reveals patient remains alert and comfortable.  She is eating breakfast heartedly, and asks for "more syrup."  Findings discussed with patient and family members, all questions were answered. Rayquon Uselman L    Final Clinical Impressions(s) / ED Diagnoses   Final diagnoses:  Transient alteration of awareness     Reported altered mental status, patient lucid during ED evaluation.  No signs or symptoms concerning for worsening condition.  Patient is afebrile today.  Nursing Notes Reviewed/ Care Coordinated Applicable Imaging Reviewed Interpretation of Laboratory Data incorporated into ED treatment  The patient appears reasonably screened and/or stabilized for discharge and I doubt any other medical condition or other [facility] requiring further screening, evaluation, or treatment in the ED at this time prior to discharge.  Plan: Home Medications-continue usual medicines; Home Treatments-rest, eat regularly; return here if the recommended treatment, does not improve the symptoms; Recommended follow up-PCP, as needed    New Prescriptions New Prescriptions   No medications on file     Daleen Bo, MD 01/21/17 [phone]

## 2017-01-23 LAB — CULTURE, BLOOD (ROUTINE X 2)
CULTURE: NO GROWTH
Culture: NO GROWTH
SPECIAL REQUESTS: ADEQUATE
Special Requests: ADEQUATE

## 2017-01-24 DIAGNOSIS — D631 Anemia in chronic kidney disease: Secondary | ICD-10-CM | POA: Diagnosis not present

## 2017-01-24 DIAGNOSIS — T82858A Stenosis of vascular prosthetic devices, implants and grafts, initial encounter: Secondary | ICD-10-CM | POA: Diagnosis not present

## 2017-01-24 DIAGNOSIS — I871 Compression of vein: Secondary | ICD-10-CM | POA: Diagnosis not present

## 2017-01-24 DIAGNOSIS — K219 Gastro-esophageal reflux disease without esophagitis: Secondary | ICD-10-CM | POA: Diagnosis not present

## 2017-01-24 DIAGNOSIS — I120 Hypertensive chronic kidney disease with stage 5 chronic kidney disease or end stage renal disease: Secondary | ICD-10-CM | POA: Diagnosis not present

## 2017-01-24 DIAGNOSIS — E440 Moderate protein-calorie malnutrition: Secondary | ICD-10-CM | POA: Diagnosis not present

## 2017-01-24 DIAGNOSIS — E039 Hypothyroidism, unspecified: Secondary | ICD-10-CM | POA: Diagnosis not present

## 2017-01-24 DIAGNOSIS — F329 Major depressive disorder, single episode, unspecified: Secondary | ICD-10-CM | POA: Diagnosis not present

## 2017-01-24 DIAGNOSIS — R42 Dizziness and giddiness: Secondary | ICD-10-CM | POA: Diagnosis not present

## 2017-01-24 DIAGNOSIS — Z8673 Personal history of transient ischemic attack (TIA), and cerebral infarction without residual deficits: Secondary | ICD-10-CM | POA: Diagnosis not present

## 2017-01-24 DIAGNOSIS — E162 Hypoglycemia, unspecified: Secondary | ICD-10-CM | POA: Diagnosis not present

## 2017-01-24 DIAGNOSIS — N186 End stage renal disease: Secondary | ICD-10-CM | POA: Diagnosis not present

## 2017-01-24 DIAGNOSIS — Z992 Dependence on renal dialysis: Secondary | ICD-10-CM | POA: Diagnosis not present

## 2017-01-24 NOTE — Telephone Encounter (Signed)
REVIEWED. AGREE. NO ADDITIONAL RECOMMENDATIONS. 

## 2017-01-25 DIAGNOSIS — D631 Anemia in chronic kidney disease: Secondary | ICD-10-CM | POA: Diagnosis not present

## 2017-01-25 DIAGNOSIS — N186 End stage renal disease: Secondary | ICD-10-CM | POA: Diagnosis not present

## 2017-01-25 DIAGNOSIS — Z992 Dependence on renal dialysis: Secondary | ICD-10-CM | POA: Diagnosis not present

## 2017-01-25 DIAGNOSIS — D509 Iron deficiency anemia, unspecified: Secondary | ICD-10-CM | POA: Diagnosis not present

## 2017-01-25 DIAGNOSIS — N2581 Secondary hyperparathyroidism of renal origin: Secondary | ICD-10-CM | POA: Diagnosis not present

## 2017-01-25 DIAGNOSIS — R112 Nausea with vomiting, unspecified: Secondary | ICD-10-CM | POA: Diagnosis not present

## 2017-01-26 DIAGNOSIS — N186 End stage renal disease: Secondary | ICD-10-CM | POA: Diagnosis not present

## 2017-01-26 DIAGNOSIS — I120 Hypertensive chronic kidney disease with stage 5 chronic kidney disease or end stage renal disease: Secondary | ICD-10-CM | POA: Diagnosis not present

## 2017-01-26 DIAGNOSIS — D631 Anemia in chronic kidney disease: Secondary | ICD-10-CM | POA: Diagnosis not present

## 2017-01-26 DIAGNOSIS — E440 Moderate protein-calorie malnutrition: Secondary | ICD-10-CM | POA: Diagnosis not present

## 2017-01-26 DIAGNOSIS — E162 Hypoglycemia, unspecified: Secondary | ICD-10-CM | POA: Diagnosis not present

## 2017-01-26 DIAGNOSIS — K219 Gastro-esophageal reflux disease without esophagitis: Secondary | ICD-10-CM | POA: Diagnosis not present

## 2017-01-27 DIAGNOSIS — D509 Iron deficiency anemia, unspecified: Secondary | ICD-10-CM | POA: Diagnosis not present

## 2017-01-27 DIAGNOSIS — N2581 Secondary hyperparathyroidism of renal origin: Secondary | ICD-10-CM | POA: Diagnosis not present

## 2017-01-27 DIAGNOSIS — N186 End stage renal disease: Secondary | ICD-10-CM | POA: Diagnosis not present

## 2017-01-27 DIAGNOSIS — D631 Anemia in chronic kidney disease: Secondary | ICD-10-CM | POA: Diagnosis not present

## 2017-01-27 DIAGNOSIS — Z992 Dependence on renal dialysis: Secondary | ICD-10-CM | POA: Diagnosis not present

## 2017-01-27 DIAGNOSIS — R112 Nausea with vomiting, unspecified: Secondary | ICD-10-CM | POA: Diagnosis not present

## 2017-01-29 DIAGNOSIS — R112 Nausea with vomiting, unspecified: Secondary | ICD-10-CM | POA: Diagnosis not present

## 2017-01-29 DIAGNOSIS — N186 End stage renal disease: Secondary | ICD-10-CM | POA: Diagnosis not present

## 2017-01-29 DIAGNOSIS — D631 Anemia in chronic kidney disease: Secondary | ICD-10-CM | POA: Diagnosis not present

## 2017-01-29 DIAGNOSIS — N2581 Secondary hyperparathyroidism of renal origin: Secondary | ICD-10-CM | POA: Diagnosis not present

## 2017-01-29 DIAGNOSIS — D509 Iron deficiency anemia, unspecified: Secondary | ICD-10-CM | POA: Diagnosis not present

## 2017-01-29 DIAGNOSIS — Z992 Dependence on renal dialysis: Secondary | ICD-10-CM | POA: Diagnosis not present

## 2017-01-30 DIAGNOSIS — Z992 Dependence on renal dialysis: Secondary | ICD-10-CM | POA: Diagnosis not present

## 2017-01-30 DIAGNOSIS — D631 Anemia in chronic kidney disease: Secondary | ICD-10-CM | POA: Diagnosis not present

## 2017-01-30 DIAGNOSIS — D509 Iron deficiency anemia, unspecified: Secondary | ICD-10-CM | POA: Diagnosis not present

## 2017-01-30 DIAGNOSIS — N2581 Secondary hyperparathyroidism of renal origin: Secondary | ICD-10-CM | POA: Diagnosis not present

## 2017-01-30 DIAGNOSIS — N186 End stage renal disease: Secondary | ICD-10-CM | POA: Diagnosis not present

## 2017-01-30 DIAGNOSIS — T782XXA Anaphylactic shock, unspecified, initial encounter: Secondary | ICD-10-CM | POA: Diagnosis not present

## 2017-01-30 DIAGNOSIS — R112 Nausea with vomiting, unspecified: Secondary | ICD-10-CM | POA: Diagnosis not present

## 2017-01-31 DIAGNOSIS — E440 Moderate protein-calorie malnutrition: Secondary | ICD-10-CM | POA: Diagnosis not present

## 2017-01-31 DIAGNOSIS — I120 Hypertensive chronic kidney disease with stage 5 chronic kidney disease or end stage renal disease: Secondary | ICD-10-CM | POA: Diagnosis not present

## 2017-01-31 DIAGNOSIS — E162 Hypoglycemia, unspecified: Secondary | ICD-10-CM | POA: Diagnosis not present

## 2017-01-31 DIAGNOSIS — D631 Anemia in chronic kidney disease: Secondary | ICD-10-CM | POA: Diagnosis not present

## 2017-01-31 DIAGNOSIS — N186 End stage renal disease: Secondary | ICD-10-CM | POA: Diagnosis not present

## 2017-01-31 DIAGNOSIS — K219 Gastro-esophageal reflux disease without esophagitis: Secondary | ICD-10-CM | POA: Diagnosis not present

## 2017-02-01 DIAGNOSIS — T782XXA Anaphylactic shock, unspecified, initial encounter: Secondary | ICD-10-CM | POA: Diagnosis not present

## 2017-02-01 DIAGNOSIS — N2581 Secondary hyperparathyroidism of renal origin: Secondary | ICD-10-CM | POA: Diagnosis not present

## 2017-02-01 DIAGNOSIS — R112 Nausea with vomiting, unspecified: Secondary | ICD-10-CM | POA: Diagnosis not present

## 2017-02-01 DIAGNOSIS — D631 Anemia in chronic kidney disease: Secondary | ICD-10-CM | POA: Diagnosis not present

## 2017-02-01 DIAGNOSIS — N186 End stage renal disease: Secondary | ICD-10-CM | POA: Diagnosis not present

## 2017-02-01 DIAGNOSIS — D509 Iron deficiency anemia, unspecified: Secondary | ICD-10-CM | POA: Diagnosis not present

## 2017-02-03 ENCOUNTER — Encounter: Payer: Self-pay | Admitting: Gastroenterology

## 2017-02-03 ENCOUNTER — Ambulatory Visit (INDEPENDENT_AMBULATORY_CARE_PROVIDER_SITE_OTHER): Payer: Medicare Other | Admitting: Gastroenterology

## 2017-02-03 VITALS — BP 102/68 | HR 86 | Temp 97.1°F | Ht 65.0 in | Wt 136.2 lb

## 2017-02-03 DIAGNOSIS — F411 Generalized anxiety disorder: Secondary | ICD-10-CM | POA: Diagnosis not present

## 2017-02-03 DIAGNOSIS — R112 Nausea with vomiting, unspecified: Secondary | ICD-10-CM | POA: Diagnosis not present

## 2017-02-03 DIAGNOSIS — D631 Anemia in chronic kidney disease: Secondary | ICD-10-CM | POA: Diagnosis not present

## 2017-02-03 DIAGNOSIS — D509 Iron deficiency anemia, unspecified: Secondary | ICD-10-CM | POA: Diagnosis not present

## 2017-02-03 DIAGNOSIS — K2930 Chronic superficial gastritis without bleeding: Secondary | ICD-10-CM | POA: Diagnosis not present

## 2017-02-03 DIAGNOSIS — R1319 Other dysphagia: Secondary | ICD-10-CM

## 2017-02-03 DIAGNOSIS — R634 Abnormal weight loss: Secondary | ICD-10-CM | POA: Diagnosis not present

## 2017-02-03 DIAGNOSIS — N2581 Secondary hyperparathyroidism of renal origin: Secondary | ICD-10-CM | POA: Diagnosis not present

## 2017-02-03 DIAGNOSIS — T782XXA Anaphylactic shock, unspecified, initial encounter: Secondary | ICD-10-CM | POA: Diagnosis not present

## 2017-02-03 DIAGNOSIS — K5903 Drug induced constipation: Secondary | ICD-10-CM

## 2017-02-03 DIAGNOSIS — N186 End stage renal disease: Secondary | ICD-10-CM | POA: Diagnosis not present

## 2017-02-03 MED ORDER — ALPRAZOLAM 0.5 MG PO TABS: 0.5000 mg | ORAL_TABLET | Freq: Two times a day (BID) | ORAL | 5 refills | Status: DC

## 2017-02-03 NOTE — Assessment & Plan Note (Addendum)
CLINICALLY IMPROVED. SYMPTOMS FAIRLY WELL CONTROLLED.  REFILL XANAX #60 [id number].

## 2017-02-03 NOTE — Assessment & Plan Note (Signed)
SYMPTOMS FAIRLY WELL CONTROLLED.  CONTINUE PROTONIX. TAKE 30 MINUTES PRIOR TO MEALS TWICE DAILY. CONTINUE TO MONITOR SYMPTOMS. 

## 2017-02-03 NOTE — Assessment & Plan Note (Signed)
SYMPTOMS FAIRLY WELL CONTROLLED.  CONTINUE TO MONITOR SYMPTOMS. 

## 2017-02-03 NOTE — Progress Notes (Signed)
Subjective:    Patient ID: Sonya [name], female    DOB: Oct 12, 1950, 66 y.o.   MRN: [id number] Redmond School, MD   HPI Husband not doing well. HANDS/FEET GET COLD WHEN ON DIALYSIS AND CAN'T FINISH SESSION. ANXIETY IS BETTER WITH XANAX. HOSPICE WAS SET UP. BLACKED OUT AFTER DIALYSIS LAST WEEK. WEIGHT UP 2 LBS.  PT DENIES FEVER, CHILLS, HEMATOCHEZIA, HEMATEMESIS, nausea, vomiting, melena, diarrhea, CHANGE IN BOWEL IN HABITS, constipation, abdominal pain, problems swallowing, OR heartburn or indigestion.  Past Medical History:  Diagnosis Date  . Anemia   . CKD (chronic kidney disease)   . Diverticulitis   . Dysphagia   . ESRD (end stage renal disease) ([place])    On HD  . Fracture of femoral neck, left, closed ([place]) 05/30/2015  . Gastritis 06/2013 & 05/2015  . Gastroesophageal reflux disease with stricture 05/2015  . GERD (gastroesophageal reflux disease)   . HTN (hypertension)   . Hyperparathyroidism due to renal insufficiency ([place])   . Hypertensive urgency 06/2013 & 04/2015  . Hypothyroidism   . ICH (intracerebral hemorrhage) ([place]) 10/2014  . 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 Protein calorie malnutrition ([place])   . Stroke ([place])   . Uterine cancer ([place]) 2012  . Vertical diplopia   . Vertigo     Past Surgical History:  Procedure Laterality Date  . ABDOMINAL HYSTERECTOMY    . APPENDECTOMY    . BASCILIC VEIN TRANSPOSITION Right 09/17/2013   Procedure: BRACHIAL VEIN TRANSPOSITION;  Surgeon: Conrad Elcho, MD;  Location: [facility];  Service: Vascular;  Laterality: Right;  . BASCILIC VEIN TRANSPOSITION Right 10/29/2013   Procedure: RIGHT SECOND STAGE BRACHIAL VEIN TRANSPOSITION;  Surgeon: Conrad Cocke, MD;  Location: [facility];  Service: Vascular;  Laterality: Right;  . CESAREAN SECTION    . CHOLECYSTECTOMY    . COLONOSCOPY  2000   TICS, IH  . COLONOSCOPY  2003 NUR BRBPR D50 V6   DC/[facility] TICS, IH  . COLONOSCOPY N/A 09/04/2013   SLF: Small ulcer in the descending colon, one colon polyp (tubular  adenoma), large internal hemorrhoids, moderate diverticulosis. next tcs 10 years.  . ESOPHAGOGASTRODUODENOSCOPY N/A 05/23/2015   ION:GEXB non-erosive gastritis/stricture at the gastroesophageal junction  . ESOPHAGOGASTRODUODENOSCOPY (EGD) WITH ESOPHAGEAL DILATION N/A 06/08/2013   Dr. Fields:Stricture at the gastroesophageal junction/MILD NON-erosive gastritis (inflammation) was found in the gastric antrum; multiple biopsies/NO SOURCE FOR ANEMIA IDETIFIED-MOST LIKELY DUE TO ANEMIA OF CHRONIC DISEASE  . FLEXIBLE SIGMOIDOSCOPY N/A 01/02/2016   Dr. Oneida Alar: external hemorrhoids, diverticulosis, internal hemorrhoids, proximal rectum normal  . GIVENS CAPSULE STUDY N/A 12/03/2013   SLF: occasional gastric erosion. small bowel normal  . HIP PINNING,CANNULATED Left 05/31/2015   Procedure: CANNULATED HIP PINNING;  Surgeon: Leandrew Koyanagi, MD;  Location: [facility];  Service: Orthopedics;  Laterality: Left;  . INSERTION OF DIALYSIS CATHETER Left 09/17/2013   Procedure: INSERTION OF DIALYSIS CATHETER;  Surgeon: Conrad [place], MD;  Location: [facility];  Service: Vascular;  Laterality: Left;  . LAPAROSCOPIC TOTAL HYSTERECTOMY    . NASAL SEPTUM SURGERY    . SAVORY DILATION N/A 05/23/2015   Procedure: SAVORY DILATION;  Surgeon: Danie Binder, MD;  [facility];  Service: Endoscopy;  Laterality: N/A;   Allergies  Allergen Reactions  . Cephalexin Itching   Current Outpatient Prescriptions  Medication Sig Dispense Refill  . alendronate (FOSAMAX) 70 MG tablet Take 70 mg by mouth once a week. Take with a full glass of water on an empty stomach.    Marland Kitchen  allopurinol (ZYLOPRIM) 300 MG tablet Take 0.5 tablets (150 mg total) by mouth daily. Dose was decreased secondary to renal failure.    Marland Kitchen ALPRAZolam (XANAX) 0.5 MG tablet Take 1 tablet (0.5 mg total) by mouth 2 (two) times daily.    . bisacodyl (DULCOLAX) 5 MG EC tablet Take 5 mg by mouth daily as needed for moderate constipation.    . cinacalcet (SENSIPAR) 30 MG tablet  Take 30 mg by mouth daily.    . diazepam (VALIUM) 2 MG tablet Take 2 mg by mouth every 6 (six) hours as needed for anxiety.    Marland Kitchen labetalol (NORMODYNE) 200 MG tablet Take 600 mg by mouth 2 (two) times daily.     Marland Kitchen levothyroxine (SYNTHROID, LEVOTHROID) 50 MCG tablet Take 1 tablet (50 mcg total) by mouth daily.    Marland Kitchen lidocaine-prilocaine (EMLA) cream Apply 1 application topically every Tuesday, Thursday, and Saturday at 6 PM.     . NIFEdipine (PROCARDIA XL/ADALAT-CC) 60 MG 24 hr tablet Take 60 mg by mouth daily.     . pantoprazole (PROTONIX) 40 MG tablet Take 1 tablet (40 mg total) by mouth 2 (two) times daily.    . sevelamer carbonate (RENVELA) 800 MG tablet Take 2,400 mg by mouth 3 (three) times daily with meals.     .       Review of Systems PER HPI OTHERWISE ALL SYSTEMS ARE NEGATIVE.    Objective:   Physical Exam  Constitutional: She is oriented to person, place, and time. She appears well-developed and well-nourished. No distress.  HENT:  Head: Normocephalic and atraumatic.  Mouth/Throat: Oropharynx is clear and moist. No oropharyngeal exudate.  Eyes: Pupils are equal, round, and reactive to light. No scleral icterus.  Neck:  LIMITED RANGE OF MOTION  Cardiovascular: Normal rate and regular rhythm.   Murmur (SYSTOLIC) heard. Pulmonary/Chest: Effort normal and breath sounds normal. No respiratory distress.  Abdominal: Soft. Bowel sounds are normal. She exhibits no distension. There is no tenderness.  Musculoskeletal: She exhibits no edema.  WALKS ASSISTED WITH A WALKER  Neurological: She is alert and oriented to person, place, and time.  NO  NEW FOCAL DEFICITS  Psychiatric: She has a normal mood and affect.  Vitals reviewed.     Assessment & Plan:

## 2017-02-03 NOTE — Assessment & Plan Note (Addendum)
SYMPTOMS FAIRLY WELL CONTROLLED.  CONTINUE TO MONITOR SYMPTOMS. DULCOLAX IF NEEDED.

## 2017-02-03 NOTE — Patient Instructions (Signed)
CONTINUE XANAX. DO NOT TAKE WITH VALIUM.  USE WOOL GLOVES AND SOCK WHILE AT DIALYSIS.   CONTINUE PROTONIX. TAKE 30 MINUTES PRIOR TO MEALS TWICE DAILY.  TAKE DULCOLAX IF NEEDED TO PREVENT  CONSTIPATION.  FOLLOW UP IN 4 MOS.

## 2017-02-03 NOTE — Progress Notes (Signed)
ON RECALL  °

## 2017-02-03 NOTE — Progress Notes (Signed)
cc'ed to pcp °

## 2017-02-03 NOTE — Assessment & Plan Note (Signed)
WEIGHT UP 2 LBS.  CONTINUE TO MONITOR SYMPTOMS.

## 2017-02-04 ENCOUNTER — Telehealth: Payer: Self-pay | Admitting: Gastroenterology

## 2017-02-04 DIAGNOSIS — K219 Gastro-esophageal reflux disease without esophagitis: Secondary | ICD-10-CM | POA: Diagnosis not present

## 2017-02-04 DIAGNOSIS — D631 Anemia in chronic kidney disease: Secondary | ICD-10-CM | POA: Diagnosis not present

## 2017-02-04 DIAGNOSIS — E440 Moderate protein-calorie malnutrition: Secondary | ICD-10-CM | POA: Diagnosis not present

## 2017-02-04 DIAGNOSIS — N186 End stage renal disease: Secondary | ICD-10-CM | POA: Diagnosis not present

## 2017-02-04 DIAGNOSIS — E162 Hypoglycemia, unspecified: Secondary | ICD-10-CM | POA: Diagnosis not present

## 2017-02-04 DIAGNOSIS — I120 Hypertensive chronic kidney disease with stage 5 chronic kidney disease or end stage renal disease: Secondary | ICD-10-CM | POA: Diagnosis not present

## 2017-02-04 MED ORDER — PANTOPRAZOLE SODIUM 40 MG PO TBEC: 40.0000 mg | DELAYED_RELEASE_TABLET | Freq: Two times a day (BID) | ORAL | 5 refills | Status: DC

## 2017-02-04 NOTE — Telephone Encounter (Signed)
Pt needs a refill on her Protonix. She uses [facility] in [place].

## 2017-02-04 NOTE — Telephone Encounter (Signed)
rx done

## 2017-02-04 NOTE — Telephone Encounter (Signed)
Forwarding to refill box.  

## 2017-02-05 DIAGNOSIS — R112 Nausea with vomiting, unspecified: Secondary | ICD-10-CM | POA: Diagnosis not present

## 2017-02-05 DIAGNOSIS — D631 Anemia in chronic kidney disease: Secondary | ICD-10-CM | POA: Diagnosis not present

## 2017-02-05 DIAGNOSIS — N186 End stage renal disease: Secondary | ICD-10-CM | POA: Diagnosis not present

## 2017-02-05 DIAGNOSIS — N2581 Secondary hyperparathyroidism of renal origin: Secondary | ICD-10-CM | POA: Diagnosis not present

## 2017-02-05 DIAGNOSIS — T782XXA Anaphylactic shock, unspecified, initial encounter: Secondary | ICD-10-CM | POA: Diagnosis not present

## 2017-02-05 DIAGNOSIS — D509 Iron deficiency anemia, unspecified: Secondary | ICD-10-CM | POA: Diagnosis not present

## 2017-02-08 DIAGNOSIS — D631 Anemia in chronic kidney disease: Secondary | ICD-10-CM | POA: Diagnosis not present

## 2017-02-08 DIAGNOSIS — D509 Iron deficiency anemia, unspecified: Secondary | ICD-10-CM | POA: Diagnosis not present

## 2017-02-08 DIAGNOSIS — N2581 Secondary hyperparathyroidism of renal origin: Secondary | ICD-10-CM | POA: Diagnosis not present

## 2017-02-08 DIAGNOSIS — Z992 Dependence on renal dialysis: Secondary | ICD-10-CM | POA: Diagnosis not present

## 2017-02-08 DIAGNOSIS — R112 Nausea with vomiting, unspecified: Secondary | ICD-10-CM | POA: Diagnosis not present

## 2017-02-08 DIAGNOSIS — N186 End stage renal disease: Secondary | ICD-10-CM | POA: Diagnosis not present

## 2017-02-08 DIAGNOSIS — T782XXA Anaphylactic shock, unspecified, initial encounter: Secondary | ICD-10-CM | POA: Diagnosis not present

## 2017-02-10 DIAGNOSIS — T782XXA Anaphylactic shock, unspecified, initial encounter: Secondary | ICD-10-CM | POA: Diagnosis not present

## 2017-02-10 DIAGNOSIS — R112 Nausea with vomiting, unspecified: Secondary | ICD-10-CM | POA: Diagnosis not present

## 2017-02-10 DIAGNOSIS — N2581 Secondary hyperparathyroidism of renal origin: Secondary | ICD-10-CM | POA: Diagnosis not present

## 2017-02-10 DIAGNOSIS — N186 End stage renal disease: Secondary | ICD-10-CM | POA: Diagnosis not present

## 2017-02-10 DIAGNOSIS — D509 Iron deficiency anemia, unspecified: Secondary | ICD-10-CM | POA: Diagnosis not present

## 2017-02-10 DIAGNOSIS — D631 Anemia in chronic kidney disease: Secondary | ICD-10-CM | POA: Diagnosis not present

## 2017-02-11 DIAGNOSIS — E162 Hypoglycemia, unspecified: Secondary | ICD-10-CM | POA: Diagnosis not present

## 2017-02-11 DIAGNOSIS — D631 Anemia in chronic kidney disease: Secondary | ICD-10-CM | POA: Diagnosis not present

## 2017-02-11 DIAGNOSIS — K219 Gastro-esophageal reflux disease without esophagitis: Secondary | ICD-10-CM | POA: Diagnosis not present

## 2017-02-11 DIAGNOSIS — N186 End stage renal disease: Secondary | ICD-10-CM | POA: Diagnosis not present

## 2017-02-11 DIAGNOSIS — E440 Moderate protein-calorie malnutrition: Secondary | ICD-10-CM | POA: Diagnosis not present

## 2017-02-11 DIAGNOSIS — I120 Hypertensive chronic kidney disease with stage 5 chronic kidney disease or end stage renal disease: Secondary | ICD-10-CM | POA: Diagnosis not present

## 2017-02-12 DIAGNOSIS — R112 Nausea with vomiting, unspecified: Secondary | ICD-10-CM | POA: Diagnosis not present

## 2017-02-12 DIAGNOSIS — D509 Iron deficiency anemia, unspecified: Secondary | ICD-10-CM | POA: Diagnosis not present

## 2017-02-12 DIAGNOSIS — D631 Anemia in chronic kidney disease: Secondary | ICD-10-CM | POA: Diagnosis not present

## 2017-02-12 DIAGNOSIS — T782XXA Anaphylactic shock, unspecified, initial encounter: Secondary | ICD-10-CM | POA: Diagnosis not present

## 2017-02-12 DIAGNOSIS — N186 End stage renal disease: Secondary | ICD-10-CM | POA: Diagnosis not present

## 2017-02-12 DIAGNOSIS — N2581 Secondary hyperparathyroidism of renal origin: Secondary | ICD-10-CM | POA: Diagnosis not present

## 2017-02-15 DIAGNOSIS — D509 Iron deficiency anemia, unspecified: Secondary | ICD-10-CM | POA: Diagnosis not present

## 2017-02-15 DIAGNOSIS — D631 Anemia in chronic kidney disease: Secondary | ICD-10-CM | POA: Diagnosis not present

## 2017-02-15 DIAGNOSIS — T782XXA Anaphylactic shock, unspecified, initial encounter: Secondary | ICD-10-CM | POA: Diagnosis not present

## 2017-02-15 DIAGNOSIS — N2581 Secondary hyperparathyroidism of renal origin: Secondary | ICD-10-CM | POA: Diagnosis not present

## 2017-02-15 DIAGNOSIS — N186 End stage renal disease: Secondary | ICD-10-CM | POA: Diagnosis not present

## 2017-02-15 DIAGNOSIS — R112 Nausea with vomiting, unspecified: Secondary | ICD-10-CM | POA: Diagnosis not present

## 2017-02-17 DIAGNOSIS — N2581 Secondary hyperparathyroidism of renal origin: Secondary | ICD-10-CM | POA: Diagnosis not present

## 2017-02-17 DIAGNOSIS — D509 Iron deficiency anemia, unspecified: Secondary | ICD-10-CM | POA: Diagnosis not present

## 2017-02-17 DIAGNOSIS — D631 Anemia in chronic kidney disease: Secondary | ICD-10-CM | POA: Diagnosis not present

## 2017-02-17 DIAGNOSIS — T782XXA Anaphylactic shock, unspecified, initial encounter: Secondary | ICD-10-CM | POA: Diagnosis not present

## 2017-02-17 DIAGNOSIS — N186 End stage renal disease: Secondary | ICD-10-CM | POA: Diagnosis not present

## 2017-02-17 DIAGNOSIS — R112 Nausea with vomiting, unspecified: Secondary | ICD-10-CM | POA: Diagnosis not present

## 2017-02-19 DIAGNOSIS — T782XXA Anaphylactic shock, unspecified, initial encounter: Secondary | ICD-10-CM | POA: Diagnosis not present

## 2017-02-19 DIAGNOSIS — D509 Iron deficiency anemia, unspecified: Secondary | ICD-10-CM | POA: Diagnosis not present

## 2017-02-19 DIAGNOSIS — D631 Anemia in chronic kidney disease: Secondary | ICD-10-CM | POA: Diagnosis not present

## 2017-02-19 DIAGNOSIS — N2581 Secondary hyperparathyroidism of renal origin: Secondary | ICD-10-CM | POA: Diagnosis not present

## 2017-02-19 DIAGNOSIS — N186 End stage renal disease: Secondary | ICD-10-CM | POA: Diagnosis not present

## 2017-02-19 DIAGNOSIS — R112 Nausea with vomiting, unspecified: Secondary | ICD-10-CM | POA: Diagnosis not present

## 2017-02-22 DIAGNOSIS — D631 Anemia in chronic kidney disease: Secondary | ICD-10-CM | POA: Diagnosis not present

## 2017-02-22 DIAGNOSIS — N2581 Secondary hyperparathyroidism of renal origin: Secondary | ICD-10-CM | POA: Diagnosis not present

## 2017-02-22 DIAGNOSIS — N186 End stage renal disease: Secondary | ICD-10-CM | POA: Diagnosis not present

## 2017-02-22 DIAGNOSIS — D509 Iron deficiency anemia, unspecified: Secondary | ICD-10-CM | POA: Diagnosis not present

## 2017-02-22 DIAGNOSIS — T782XXA Anaphylactic shock, unspecified, initial encounter: Secondary | ICD-10-CM | POA: Diagnosis not present

## 2017-02-22 DIAGNOSIS — R112 Nausea with vomiting, unspecified: Secondary | ICD-10-CM | POA: Diagnosis not present

## 2017-02-23 DIAGNOSIS — D631 Anemia in chronic kidney disease: Secondary | ICD-10-CM | POA: Diagnosis not present

## 2017-02-23 DIAGNOSIS — K219 Gastro-esophageal reflux disease without esophagitis: Secondary | ICD-10-CM | POA: Diagnosis not present

## 2017-02-23 DIAGNOSIS — E162 Hypoglycemia, unspecified: Secondary | ICD-10-CM | POA: Diagnosis not present

## 2017-02-23 DIAGNOSIS — E440 Moderate protein-calorie malnutrition: Secondary | ICD-10-CM | POA: Diagnosis not present

## 2017-02-23 DIAGNOSIS — N186 End stage renal disease: Secondary | ICD-10-CM | POA: Diagnosis not present

## 2017-02-23 DIAGNOSIS — I120 Hypertensive chronic kidney disease with stage 5 chronic kidney disease or end stage renal disease: Secondary | ICD-10-CM | POA: Diagnosis not present

## 2017-02-24 DIAGNOSIS — N186 End stage renal disease: Secondary | ICD-10-CM | POA: Diagnosis not present

## 2017-02-24 DIAGNOSIS — D509 Iron deficiency anemia, unspecified: Secondary | ICD-10-CM | POA: Diagnosis not present

## 2017-02-24 DIAGNOSIS — T782XXA Anaphylactic shock, unspecified, initial encounter: Secondary | ICD-10-CM | POA: Diagnosis not present

## 2017-02-24 DIAGNOSIS — R112 Nausea with vomiting, unspecified: Secondary | ICD-10-CM | POA: Diagnosis not present

## 2017-02-24 DIAGNOSIS — N2581 Secondary hyperparathyroidism of renal origin: Secondary | ICD-10-CM | POA: Diagnosis not present

## 2017-02-24 DIAGNOSIS — D631 Anemia in chronic kidney disease: Secondary | ICD-10-CM | POA: Diagnosis not present

## 2017-02-26 DIAGNOSIS — N2581 Secondary hyperparathyroidism of renal origin: Secondary | ICD-10-CM | POA: Diagnosis not present

## 2017-02-26 DIAGNOSIS — T782XXA Anaphylactic shock, unspecified, initial encounter: Secondary | ICD-10-CM | POA: Diagnosis not present

## 2017-02-26 DIAGNOSIS — D509 Iron deficiency anemia, unspecified: Secondary | ICD-10-CM | POA: Diagnosis not present

## 2017-02-26 DIAGNOSIS — D631 Anemia in chronic kidney disease: Secondary | ICD-10-CM | POA: Diagnosis not present

## 2017-02-26 DIAGNOSIS — N186 End stage renal disease: Secondary | ICD-10-CM | POA: Diagnosis not present

## 2017-02-26 DIAGNOSIS — R112 Nausea with vomiting, unspecified: Secondary | ICD-10-CM | POA: Diagnosis not present

## 2017-03-01 DIAGNOSIS — R112 Nausea with vomiting, unspecified: Secondary | ICD-10-CM | POA: Diagnosis not present

## 2017-03-01 DIAGNOSIS — D509 Iron deficiency anemia, unspecified: Secondary | ICD-10-CM | POA: Diagnosis not present

## 2017-03-01 DIAGNOSIS — T782XXA Anaphylactic shock, unspecified, initial encounter: Secondary | ICD-10-CM | POA: Diagnosis not present

## 2017-03-01 DIAGNOSIS — Z992 Dependence on renal dialysis: Secondary | ICD-10-CM | POA: Diagnosis not present

## 2017-03-01 DIAGNOSIS — N2581 Secondary hyperparathyroidism of renal origin: Secondary | ICD-10-CM | POA: Diagnosis not present

## 2017-03-01 DIAGNOSIS — N186 End stage renal disease: Secondary | ICD-10-CM | POA: Diagnosis not present

## 2017-03-01 DIAGNOSIS — D631 Anemia in chronic kidney disease: Secondary | ICD-10-CM | POA: Diagnosis not present

## 2017-03-02 DIAGNOSIS — N2581 Secondary hyperparathyroidism of renal origin: Secondary | ICD-10-CM | POA: Diagnosis not present

## 2017-03-02 DIAGNOSIS — D509 Iron deficiency anemia, unspecified: Secondary | ICD-10-CM | POA: Diagnosis not present

## 2017-03-02 DIAGNOSIS — N186 End stage renal disease: Secondary | ICD-10-CM | POA: Diagnosis not present

## 2017-03-02 DIAGNOSIS — Z992 Dependence on renal dialysis: Secondary | ICD-10-CM | POA: Diagnosis not present

## 2017-03-03 DIAGNOSIS — N186 End stage renal disease: Secondary | ICD-10-CM | POA: Diagnosis not present

## 2017-03-03 DIAGNOSIS — D509 Iron deficiency anemia, unspecified: Secondary | ICD-10-CM | POA: Diagnosis not present

## 2017-03-03 DIAGNOSIS — Z992 Dependence on renal dialysis: Secondary | ICD-10-CM | POA: Diagnosis not present

## 2017-03-03 DIAGNOSIS — N2581 Secondary hyperparathyroidism of renal origin: Secondary | ICD-10-CM | POA: Diagnosis not present

## 2017-03-05 DIAGNOSIS — N186 End stage renal disease: Secondary | ICD-10-CM | POA: Diagnosis not present

## 2017-03-05 DIAGNOSIS — D509 Iron deficiency anemia, unspecified: Secondary | ICD-10-CM | POA: Diagnosis not present

## 2017-03-05 DIAGNOSIS — Z992 Dependence on renal dialysis: Secondary | ICD-10-CM | POA: Diagnosis not present

## 2017-03-05 DIAGNOSIS — N2581 Secondary hyperparathyroidism of renal origin: Secondary | ICD-10-CM | POA: Diagnosis not present

## 2017-03-08 DIAGNOSIS — N186 End stage renal disease: Secondary | ICD-10-CM | POA: Diagnosis not present

## 2017-03-08 DIAGNOSIS — D509 Iron deficiency anemia, unspecified: Secondary | ICD-10-CM | POA: Diagnosis not present

## 2017-03-08 DIAGNOSIS — N2581 Secondary hyperparathyroidism of renal origin: Secondary | ICD-10-CM | POA: Diagnosis not present

## 2017-03-08 DIAGNOSIS — Z992 Dependence on renal dialysis: Secondary | ICD-10-CM | POA: Diagnosis not present

## 2017-03-09 DIAGNOSIS — D631 Anemia in chronic kidney disease: Secondary | ICD-10-CM | POA: Diagnosis not present

## 2017-03-09 DIAGNOSIS — I120 Hypertensive chronic kidney disease with stage 5 chronic kidney disease or end stage renal disease: Secondary | ICD-10-CM | POA: Diagnosis not present

## 2017-03-09 DIAGNOSIS — K219 Gastro-esophageal reflux disease without esophagitis: Secondary | ICD-10-CM | POA: Diagnosis not present

## 2017-03-09 DIAGNOSIS — E440 Moderate protein-calorie malnutrition: Secondary | ICD-10-CM | POA: Diagnosis not present

## 2017-03-09 DIAGNOSIS — N186 End stage renal disease: Secondary | ICD-10-CM | POA: Diagnosis not present

## 2017-03-09 DIAGNOSIS — E162 Hypoglycemia, unspecified: Secondary | ICD-10-CM | POA: Diagnosis not present

## 2017-03-10 DIAGNOSIS — N2581 Secondary hyperparathyroidism of renal origin: Secondary | ICD-10-CM | POA: Diagnosis not present

## 2017-03-10 DIAGNOSIS — Z992 Dependence on renal dialysis: Secondary | ICD-10-CM | POA: Diagnosis not present

## 2017-03-10 DIAGNOSIS — N186 End stage renal disease: Secondary | ICD-10-CM | POA: Diagnosis not present

## 2017-03-10 DIAGNOSIS — D509 Iron deficiency anemia, unspecified: Secondary | ICD-10-CM | POA: Diagnosis not present

## 2017-03-11 ENCOUNTER — Other Ambulatory Visit: Payer: Self-pay | Admitting: Gastroenterology

## 2017-03-12 DIAGNOSIS — D509 Iron deficiency anemia, unspecified: Secondary | ICD-10-CM | POA: Diagnosis not present

## 2017-03-12 DIAGNOSIS — Z992 Dependence on renal dialysis: Secondary | ICD-10-CM | POA: Diagnosis not present

## 2017-03-12 DIAGNOSIS — N186 End stage renal disease: Secondary | ICD-10-CM | POA: Diagnosis not present

## 2017-03-12 DIAGNOSIS — N2581 Secondary hyperparathyroidism of renal origin: Secondary | ICD-10-CM | POA: Diagnosis not present

## 2017-03-14 DIAGNOSIS — D509 Iron deficiency anemia, unspecified: Secondary | ICD-10-CM | POA: Diagnosis not present

## 2017-03-14 DIAGNOSIS — N2581 Secondary hyperparathyroidism of renal origin: Secondary | ICD-10-CM | POA: Diagnosis not present

## 2017-03-14 DIAGNOSIS — Z992 Dependence on renal dialysis: Secondary | ICD-10-CM | POA: Diagnosis not present

## 2017-03-14 DIAGNOSIS — N186 End stage renal disease: Secondary | ICD-10-CM | POA: Diagnosis not present

## 2017-03-15 DIAGNOSIS — D509 Iron deficiency anemia, unspecified: Secondary | ICD-10-CM | POA: Diagnosis not present

## 2017-03-15 DIAGNOSIS — N186 End stage renal disease: Secondary | ICD-10-CM | POA: Diagnosis not present

## 2017-03-15 DIAGNOSIS — Z992 Dependence on renal dialysis: Secondary | ICD-10-CM | POA: Diagnosis not present

## 2017-03-15 DIAGNOSIS — N2581 Secondary hyperparathyroidism of renal origin: Secondary | ICD-10-CM | POA: Diagnosis not present

## 2017-03-17 DIAGNOSIS — D509 Iron deficiency anemia, unspecified: Secondary | ICD-10-CM | POA: Diagnosis not present

## 2017-03-17 DIAGNOSIS — N2581 Secondary hyperparathyroidism of renal origin: Secondary | ICD-10-CM | POA: Diagnosis not present

## 2017-03-17 DIAGNOSIS — Z992 Dependence on renal dialysis: Secondary | ICD-10-CM | POA: Diagnosis not present

## 2017-03-17 DIAGNOSIS — N186 End stage renal disease: Secondary | ICD-10-CM | POA: Diagnosis not present

## 2017-03-19 DIAGNOSIS — N186 End stage renal disease: Secondary | ICD-10-CM | POA: Diagnosis not present

## 2017-03-19 DIAGNOSIS — D509 Iron deficiency anemia, unspecified: Secondary | ICD-10-CM | POA: Diagnosis not present

## 2017-03-19 DIAGNOSIS — N2581 Secondary hyperparathyroidism of renal origin: Secondary | ICD-10-CM | POA: Diagnosis not present

## 2017-03-19 DIAGNOSIS — Z992 Dependence on renal dialysis: Secondary | ICD-10-CM | POA: Diagnosis not present

## 2017-03-22 DIAGNOSIS — D509 Iron deficiency anemia, unspecified: Secondary | ICD-10-CM | POA: Diagnosis not present

## 2017-03-22 DIAGNOSIS — Z992 Dependence on renal dialysis: Secondary | ICD-10-CM | POA: Diagnosis not present

## 2017-03-22 DIAGNOSIS — N186 End stage renal disease: Secondary | ICD-10-CM | POA: Diagnosis not present

## 2017-03-22 DIAGNOSIS — N2581 Secondary hyperparathyroidism of renal origin: Secondary | ICD-10-CM | POA: Diagnosis not present

## 2017-03-23 DIAGNOSIS — E440 Moderate protein-calorie malnutrition: Secondary | ICD-10-CM | POA: Diagnosis not present

## 2017-03-23 DIAGNOSIS — D631 Anemia in chronic kidney disease: Secondary | ICD-10-CM | POA: Diagnosis not present

## 2017-03-23 DIAGNOSIS — N186 End stage renal disease: Secondary | ICD-10-CM | POA: Diagnosis not present

## 2017-03-23 DIAGNOSIS — E162 Hypoglycemia, unspecified: Secondary | ICD-10-CM | POA: Diagnosis not present

## 2017-03-23 DIAGNOSIS — K219 Gastro-esophageal reflux disease without esophagitis: Secondary | ICD-10-CM | POA: Diagnosis not present

## 2017-03-23 DIAGNOSIS — I120 Hypertensive chronic kidney disease with stage 5 chronic kidney disease or end stage renal disease: Secondary | ICD-10-CM | POA: Diagnosis not present

## 2017-03-24 DIAGNOSIS — Z992 Dependence on renal dialysis: Secondary | ICD-10-CM | POA: Diagnosis not present

## 2017-03-24 DIAGNOSIS — D509 Iron deficiency anemia, unspecified: Secondary | ICD-10-CM | POA: Diagnosis not present

## 2017-03-24 DIAGNOSIS — N2581 Secondary hyperparathyroidism of renal origin: Secondary | ICD-10-CM | POA: Diagnosis not present

## 2017-03-24 DIAGNOSIS — N186 End stage renal disease: Secondary | ICD-10-CM | POA: Diagnosis not present

## 2017-03-26 DIAGNOSIS — Z992 Dependence on renal dialysis: Secondary | ICD-10-CM | POA: Diagnosis not present

## 2017-03-26 DIAGNOSIS — D509 Iron deficiency anemia, unspecified: Secondary | ICD-10-CM | POA: Diagnosis not present

## 2017-03-26 DIAGNOSIS — N2581 Secondary hyperparathyroidism of renal origin: Secondary | ICD-10-CM | POA: Diagnosis not present

## 2017-03-26 DIAGNOSIS — N186 End stage renal disease: Secondary | ICD-10-CM | POA: Diagnosis not present

## 2017-03-29 DIAGNOSIS — Z992 Dependence on renal dialysis: Secondary | ICD-10-CM | POA: Diagnosis not present

## 2017-03-29 DIAGNOSIS — D509 Iron deficiency anemia, unspecified: Secondary | ICD-10-CM | POA: Diagnosis not present

## 2017-03-29 DIAGNOSIS — N2581 Secondary hyperparathyroidism of renal origin: Secondary | ICD-10-CM | POA: Diagnosis not present

## 2017-03-29 DIAGNOSIS — N186 End stage renal disease: Secondary | ICD-10-CM | POA: Diagnosis not present

## 2017-03-31 DIAGNOSIS — N186 End stage renal disease: Secondary | ICD-10-CM | POA: Diagnosis not present

## 2017-03-31 DIAGNOSIS — Z992 Dependence on renal dialysis: Secondary | ICD-10-CM | POA: Diagnosis not present

## 2017-03-31 DIAGNOSIS — D509 Iron deficiency anemia, unspecified: Secondary | ICD-10-CM | POA: Diagnosis not present

## 2017-03-31 DIAGNOSIS — N2581 Secondary hyperparathyroidism of renal origin: Secondary | ICD-10-CM | POA: Diagnosis not present

## 2017-04-01 DIAGNOSIS — N186 End stage renal disease: Secondary | ICD-10-CM | POA: Diagnosis not present

## 2017-04-01 DIAGNOSIS — Z992 Dependence on renal dialysis: Secondary | ICD-10-CM | POA: Diagnosis not present

## 2017-04-02 DIAGNOSIS — N186 End stage renal disease: Secondary | ICD-10-CM | POA: Diagnosis not present

## 2017-04-02 DIAGNOSIS — D631 Anemia in chronic kidney disease: Secondary | ICD-10-CM | POA: Diagnosis not present

## 2017-04-02 DIAGNOSIS — Z992 Dependence on renal dialysis: Secondary | ICD-10-CM | POA: Diagnosis not present

## 2017-04-02 DIAGNOSIS — D509 Iron deficiency anemia, unspecified: Secondary | ICD-10-CM | POA: Diagnosis not present

## 2017-04-02 DIAGNOSIS — N2581 Secondary hyperparathyroidism of renal origin: Secondary | ICD-10-CM | POA: Diagnosis not present

## 2017-04-05 DIAGNOSIS — D631 Anemia in chronic kidney disease: Secondary | ICD-10-CM | POA: Diagnosis not present

## 2017-04-05 DIAGNOSIS — N186 End stage renal disease: Secondary | ICD-10-CM | POA: Diagnosis not present

## 2017-04-05 DIAGNOSIS — D509 Iron deficiency anemia, unspecified: Secondary | ICD-10-CM | POA: Diagnosis not present

## 2017-04-05 DIAGNOSIS — Z992 Dependence on renal dialysis: Secondary | ICD-10-CM | POA: Diagnosis not present

## 2017-04-05 DIAGNOSIS — N2581 Secondary hyperparathyroidism of renal origin: Secondary | ICD-10-CM | POA: Diagnosis not present

## 2017-04-07 DIAGNOSIS — N186 End stage renal disease: Secondary | ICD-10-CM | POA: Diagnosis not present

## 2017-04-07 DIAGNOSIS — D509 Iron deficiency anemia, unspecified: Secondary | ICD-10-CM | POA: Diagnosis not present

## 2017-04-07 DIAGNOSIS — D631 Anemia in chronic kidney disease: Secondary | ICD-10-CM | POA: Diagnosis not present

## 2017-04-07 DIAGNOSIS — N2581 Secondary hyperparathyroidism of renal origin: Secondary | ICD-10-CM | POA: Diagnosis not present

## 2017-04-07 DIAGNOSIS — Z992 Dependence on renal dialysis: Secondary | ICD-10-CM | POA: Diagnosis not present

## 2017-04-09 DIAGNOSIS — Z992 Dependence on renal dialysis: Secondary | ICD-10-CM | POA: Diagnosis not present

## 2017-04-09 DIAGNOSIS — N186 End stage renal disease: Secondary | ICD-10-CM | POA: Diagnosis not present

## 2017-04-09 DIAGNOSIS — N2581 Secondary hyperparathyroidism of renal origin: Secondary | ICD-10-CM | POA: Diagnosis not present

## 2017-04-09 DIAGNOSIS — D509 Iron deficiency anemia, unspecified: Secondary | ICD-10-CM | POA: Diagnosis not present

## 2017-04-09 DIAGNOSIS — D631 Anemia in chronic kidney disease: Secondary | ICD-10-CM | POA: Diagnosis not present

## 2017-04-12 DIAGNOSIS — Z992 Dependence on renal dialysis: Secondary | ICD-10-CM | POA: Diagnosis not present

## 2017-04-12 DIAGNOSIS — D631 Anemia in chronic kidney disease: Secondary | ICD-10-CM | POA: Diagnosis not present

## 2017-04-12 DIAGNOSIS — N2581 Secondary hyperparathyroidism of renal origin: Secondary | ICD-10-CM | POA: Diagnosis not present

## 2017-04-12 DIAGNOSIS — D509 Iron deficiency anemia, unspecified: Secondary | ICD-10-CM | POA: Diagnosis not present

## 2017-04-12 DIAGNOSIS — N186 End stage renal disease: Secondary | ICD-10-CM | POA: Diagnosis not present

## 2017-04-14 DIAGNOSIS — D631 Anemia in chronic kidney disease: Secondary | ICD-10-CM | POA: Diagnosis not present

## 2017-04-14 DIAGNOSIS — D509 Iron deficiency anemia, unspecified: Secondary | ICD-10-CM | POA: Diagnosis not present

## 2017-04-14 DIAGNOSIS — N2581 Secondary hyperparathyroidism of renal origin: Secondary | ICD-10-CM | POA: Diagnosis not present

## 2017-04-14 DIAGNOSIS — Z992 Dependence on renal dialysis: Secondary | ICD-10-CM | POA: Diagnosis not present

## 2017-04-14 DIAGNOSIS — N186 End stage renal disease: Secondary | ICD-10-CM | POA: Diagnosis not present

## 2017-04-16 DIAGNOSIS — N186 End stage renal disease: Secondary | ICD-10-CM | POA: Diagnosis not present

## 2017-04-16 DIAGNOSIS — Z992 Dependence on renal dialysis: Secondary | ICD-10-CM | POA: Diagnosis not present

## 2017-04-16 DIAGNOSIS — N2581 Secondary hyperparathyroidism of renal origin: Secondary | ICD-10-CM | POA: Diagnosis not present

## 2017-04-16 DIAGNOSIS — D509 Iron deficiency anemia, unspecified: Secondary | ICD-10-CM | POA: Diagnosis not present

## 2017-04-16 DIAGNOSIS — D631 Anemia in chronic kidney disease: Secondary | ICD-10-CM | POA: Diagnosis not present

## 2017-04-19 DIAGNOSIS — D631 Anemia in chronic kidney disease: Secondary | ICD-10-CM | POA: Diagnosis not present

## 2017-04-19 DIAGNOSIS — N2581 Secondary hyperparathyroidism of renal origin: Secondary | ICD-10-CM | POA: Diagnosis not present

## 2017-04-19 DIAGNOSIS — Z992 Dependence on renal dialysis: Secondary | ICD-10-CM | POA: Diagnosis not present

## 2017-04-19 DIAGNOSIS — D509 Iron deficiency anemia, unspecified: Secondary | ICD-10-CM | POA: Diagnosis not present

## 2017-04-19 DIAGNOSIS — N186 End stage renal disease: Secondary | ICD-10-CM | POA: Diagnosis not present

## 2017-04-21 DIAGNOSIS — D509 Iron deficiency anemia, unspecified: Secondary | ICD-10-CM | POA: Diagnosis not present

## 2017-04-21 DIAGNOSIS — Z992 Dependence on renal dialysis: Secondary | ICD-10-CM | POA: Diagnosis not present

## 2017-04-21 DIAGNOSIS — N2581 Secondary hyperparathyroidism of renal origin: Secondary | ICD-10-CM | POA: Diagnosis not present

## 2017-04-21 DIAGNOSIS — D631 Anemia in chronic kidney disease: Secondary | ICD-10-CM | POA: Diagnosis not present

## 2017-04-21 DIAGNOSIS — N186 End stage renal disease: Secondary | ICD-10-CM | POA: Diagnosis not present

## 2017-04-23 DIAGNOSIS — N2581 Secondary hyperparathyroidism of renal origin: Secondary | ICD-10-CM | POA: Diagnosis not present

## 2017-04-23 DIAGNOSIS — D631 Anemia in chronic kidney disease: Secondary | ICD-10-CM | POA: Diagnosis not present

## 2017-04-23 DIAGNOSIS — D509 Iron deficiency anemia, unspecified: Secondary | ICD-10-CM | POA: Diagnosis not present

## 2017-04-23 DIAGNOSIS — Z992 Dependence on renal dialysis: Secondary | ICD-10-CM | POA: Diagnosis not present

## 2017-04-23 DIAGNOSIS — N186 End stage renal disease: Secondary | ICD-10-CM | POA: Diagnosis not present

## 2017-04-26 DIAGNOSIS — D631 Anemia in chronic kidney disease: Secondary | ICD-10-CM | POA: Diagnosis not present

## 2017-04-26 DIAGNOSIS — Z992 Dependence on renal dialysis: Secondary | ICD-10-CM | POA: Diagnosis not present

## 2017-04-26 DIAGNOSIS — N2581 Secondary hyperparathyroidism of renal origin: Secondary | ICD-10-CM | POA: Diagnosis not present

## 2017-04-26 DIAGNOSIS — D509 Iron deficiency anemia, unspecified: Secondary | ICD-10-CM | POA: Diagnosis not present

## 2017-04-26 DIAGNOSIS — N186 End stage renal disease: Secondary | ICD-10-CM | POA: Diagnosis not present

## 2017-04-28 DIAGNOSIS — N186 End stage renal disease: Secondary | ICD-10-CM | POA: Diagnosis not present

## 2017-04-28 DIAGNOSIS — D631 Anemia in chronic kidney disease: Secondary | ICD-10-CM | POA: Diagnosis not present

## 2017-04-28 DIAGNOSIS — D509 Iron deficiency anemia, unspecified: Secondary | ICD-10-CM | POA: Diagnosis not present

## 2017-04-28 DIAGNOSIS — N2581 Secondary hyperparathyroidism of renal origin: Secondary | ICD-10-CM | POA: Diagnosis not present

## 2017-04-28 DIAGNOSIS — Z992 Dependence on renal dialysis: Secondary | ICD-10-CM | POA: Diagnosis not present

## 2017-04-30 DIAGNOSIS — D631 Anemia in chronic kidney disease: Secondary | ICD-10-CM | POA: Diagnosis not present

## 2017-04-30 DIAGNOSIS — Z992 Dependence on renal dialysis: Secondary | ICD-10-CM | POA: Diagnosis not present

## 2017-04-30 DIAGNOSIS — N186 End stage renal disease: Secondary | ICD-10-CM | POA: Diagnosis not present

## 2017-04-30 DIAGNOSIS — D509 Iron deficiency anemia, unspecified: Secondary | ICD-10-CM | POA: Diagnosis not present

## 2017-04-30 DIAGNOSIS — N2581 Secondary hyperparathyroidism of renal origin: Secondary | ICD-10-CM | POA: Diagnosis not present

## 2017-05-01 DIAGNOSIS — N186 End stage renal disease: Secondary | ICD-10-CM | POA: Diagnosis not present

## 2017-05-01 DIAGNOSIS — Z992 Dependence on renal dialysis: Secondary | ICD-10-CM | POA: Diagnosis not present

## 2017-05-03 DIAGNOSIS — Z23 Encounter for immunization: Secondary | ICD-10-CM | POA: Diagnosis not present

## 2017-05-03 DIAGNOSIS — D509 Iron deficiency anemia, unspecified: Secondary | ICD-10-CM | POA: Diagnosis not present

## 2017-05-03 DIAGNOSIS — Z992 Dependence on renal dialysis: Secondary | ICD-10-CM | POA: Diagnosis not present

## 2017-05-03 DIAGNOSIS — N2581 Secondary hyperparathyroidism of renal origin: Secondary | ICD-10-CM | POA: Diagnosis not present

## 2017-05-03 DIAGNOSIS — D631 Anemia in chronic kidney disease: Secondary | ICD-10-CM | POA: Diagnosis not present

## 2017-05-03 DIAGNOSIS — N186 End stage renal disease: Secondary | ICD-10-CM | POA: Diagnosis not present

## 2017-05-04 ENCOUNTER — Encounter: Payer: Self-pay | Admitting: Gastroenterology

## 2017-05-05 DIAGNOSIS — D631 Anemia in chronic kidney disease: Secondary | ICD-10-CM | POA: Diagnosis not present

## 2017-05-05 DIAGNOSIS — N2581 Secondary hyperparathyroidism of renal origin: Secondary | ICD-10-CM | POA: Diagnosis not present

## 2017-05-05 DIAGNOSIS — Z23 Encounter for immunization: Secondary | ICD-10-CM | POA: Diagnosis not present

## 2017-05-05 DIAGNOSIS — D509 Iron deficiency anemia, unspecified: Secondary | ICD-10-CM | POA: Diagnosis not present

## 2017-05-05 DIAGNOSIS — N186 End stage renal disease: Secondary | ICD-10-CM | POA: Diagnosis not present

## 2017-05-05 DIAGNOSIS — Z992 Dependence on renal dialysis: Secondary | ICD-10-CM | POA: Diagnosis not present

## 2017-05-07 DIAGNOSIS — N186 End stage renal disease: Secondary | ICD-10-CM | POA: Diagnosis not present

## 2017-05-07 DIAGNOSIS — D509 Iron deficiency anemia, unspecified: Secondary | ICD-10-CM | POA: Diagnosis not present

## 2017-05-07 DIAGNOSIS — Z992 Dependence on renal dialysis: Secondary | ICD-10-CM | POA: Diagnosis not present

## 2017-05-07 DIAGNOSIS — Z23 Encounter for immunization: Secondary | ICD-10-CM | POA: Diagnosis not present

## 2017-05-07 DIAGNOSIS — D631 Anemia in chronic kidney disease: Secondary | ICD-10-CM | POA: Diagnosis not present

## 2017-05-07 DIAGNOSIS — N2581 Secondary hyperparathyroidism of renal origin: Secondary | ICD-10-CM | POA: Diagnosis not present

## 2017-05-10 DIAGNOSIS — Z992 Dependence on renal dialysis: Secondary | ICD-10-CM | POA: Diagnosis not present

## 2017-05-10 DIAGNOSIS — D509 Iron deficiency anemia, unspecified: Secondary | ICD-10-CM | POA: Diagnosis not present

## 2017-05-10 DIAGNOSIS — N186 End stage renal disease: Secondary | ICD-10-CM | POA: Diagnosis not present

## 2017-05-10 DIAGNOSIS — D631 Anemia in chronic kidney disease: Secondary | ICD-10-CM | POA: Diagnosis not present

## 2017-05-10 DIAGNOSIS — Z23 Encounter for immunization: Secondary | ICD-10-CM | POA: Diagnosis not present

## 2017-05-10 DIAGNOSIS — N2581 Secondary hyperparathyroidism of renal origin: Secondary | ICD-10-CM | POA: Diagnosis not present

## 2017-05-12 DIAGNOSIS — Z992 Dependence on renal dialysis: Secondary | ICD-10-CM | POA: Diagnosis not present

## 2017-05-12 DIAGNOSIS — N186 End stage renal disease: Secondary | ICD-10-CM | POA: Diagnosis not present

## 2017-05-12 DIAGNOSIS — D509 Iron deficiency anemia, unspecified: Secondary | ICD-10-CM | POA: Diagnosis not present

## 2017-05-12 DIAGNOSIS — D631 Anemia in chronic kidney disease: Secondary | ICD-10-CM | POA: Diagnosis not present

## 2017-05-12 DIAGNOSIS — N2581 Secondary hyperparathyroidism of renal origin: Secondary | ICD-10-CM | POA: Diagnosis not present

## 2017-05-12 DIAGNOSIS — Z23 Encounter for immunization: Secondary | ICD-10-CM | POA: Diagnosis not present

## 2017-05-15 DIAGNOSIS — D509 Iron deficiency anemia, unspecified: Secondary | ICD-10-CM | POA: Diagnosis not present

## 2017-05-15 DIAGNOSIS — D631 Anemia in chronic kidney disease: Secondary | ICD-10-CM | POA: Diagnosis not present

## 2017-05-15 DIAGNOSIS — N2581 Secondary hyperparathyroidism of renal origin: Secondary | ICD-10-CM | POA: Diagnosis not present

## 2017-05-15 DIAGNOSIS — Z992 Dependence on renal dialysis: Secondary | ICD-10-CM | POA: Diagnosis not present

## 2017-05-15 DIAGNOSIS — N186 End stage renal disease: Secondary | ICD-10-CM | POA: Diagnosis not present

## 2017-05-15 DIAGNOSIS — Z23 Encounter for immunization: Secondary | ICD-10-CM | POA: Diagnosis not present

## 2017-05-17 DIAGNOSIS — D631 Anemia in chronic kidney disease: Secondary | ICD-10-CM | POA: Diagnosis not present

## 2017-05-17 DIAGNOSIS — N186 End stage renal disease: Secondary | ICD-10-CM | POA: Diagnosis not present

## 2017-05-17 DIAGNOSIS — Z992 Dependence on renal dialysis: Secondary | ICD-10-CM | POA: Diagnosis not present

## 2017-05-17 DIAGNOSIS — D509 Iron deficiency anemia, unspecified: Secondary | ICD-10-CM | POA: Diagnosis not present

## 2017-05-17 DIAGNOSIS — Z23 Encounter for immunization: Secondary | ICD-10-CM | POA: Diagnosis not present

## 2017-05-17 DIAGNOSIS — N2581 Secondary hyperparathyroidism of renal origin: Secondary | ICD-10-CM | POA: Diagnosis not present

## 2017-05-19 DIAGNOSIS — N2581 Secondary hyperparathyroidism of renal origin: Secondary | ICD-10-CM | POA: Diagnosis not present

## 2017-05-19 DIAGNOSIS — D509 Iron deficiency anemia, unspecified: Secondary | ICD-10-CM | POA: Diagnosis not present

## 2017-05-19 DIAGNOSIS — D631 Anemia in chronic kidney disease: Secondary | ICD-10-CM | POA: Diagnosis not present

## 2017-05-19 DIAGNOSIS — Z992 Dependence on renal dialysis: Secondary | ICD-10-CM | POA: Diagnosis not present

## 2017-05-19 DIAGNOSIS — N186 End stage renal disease: Secondary | ICD-10-CM | POA: Diagnosis not present

## 2017-05-19 DIAGNOSIS — Z23 Encounter for immunization: Secondary | ICD-10-CM | POA: Diagnosis not present

## 2017-05-21 DIAGNOSIS — D631 Anemia in chronic kidney disease: Secondary | ICD-10-CM | POA: Diagnosis not present

## 2017-05-21 DIAGNOSIS — Z992 Dependence on renal dialysis: Secondary | ICD-10-CM | POA: Diagnosis not present

## 2017-05-21 DIAGNOSIS — Z23 Encounter for immunization: Secondary | ICD-10-CM | POA: Diagnosis not present

## 2017-05-21 DIAGNOSIS — N2581 Secondary hyperparathyroidism of renal origin: Secondary | ICD-10-CM | POA: Diagnosis not present

## 2017-05-21 DIAGNOSIS — D509 Iron deficiency anemia, unspecified: Secondary | ICD-10-CM | POA: Diagnosis not present

## 2017-05-21 DIAGNOSIS — N186 End stage renal disease: Secondary | ICD-10-CM | POA: Diagnosis not present

## 2017-05-24 DIAGNOSIS — Z23 Encounter for immunization: Secondary | ICD-10-CM | POA: Diagnosis not present

## 2017-05-24 DIAGNOSIS — N2581 Secondary hyperparathyroidism of renal origin: Secondary | ICD-10-CM | POA: Diagnosis not present

## 2017-05-24 DIAGNOSIS — D631 Anemia in chronic kidney disease: Secondary | ICD-10-CM | POA: Diagnosis not present

## 2017-05-24 DIAGNOSIS — Z992 Dependence on renal dialysis: Secondary | ICD-10-CM | POA: Diagnosis not present

## 2017-05-24 DIAGNOSIS — N186 End stage renal disease: Secondary | ICD-10-CM | POA: Diagnosis not present

## 2017-05-24 DIAGNOSIS — D509 Iron deficiency anemia, unspecified: Secondary | ICD-10-CM | POA: Diagnosis not present

## 2017-05-26 DIAGNOSIS — Z23 Encounter for immunization: Secondary | ICD-10-CM | POA: Diagnosis not present

## 2017-05-26 DIAGNOSIS — D631 Anemia in chronic kidney disease: Secondary | ICD-10-CM | POA: Diagnosis not present

## 2017-05-26 DIAGNOSIS — Z992 Dependence on renal dialysis: Secondary | ICD-10-CM | POA: Diagnosis not present

## 2017-05-26 DIAGNOSIS — N186 End stage renal disease: Secondary | ICD-10-CM | POA: Diagnosis not present

## 2017-05-26 DIAGNOSIS — N2581 Secondary hyperparathyroidism of renal origin: Secondary | ICD-10-CM | POA: Diagnosis not present

## 2017-05-26 DIAGNOSIS — D509 Iron deficiency anemia, unspecified: Secondary | ICD-10-CM | POA: Diagnosis not present

## 2017-05-28 DIAGNOSIS — N186 End stage renal disease: Secondary | ICD-10-CM | POA: Diagnosis not present

## 2017-05-28 DIAGNOSIS — D509 Iron deficiency anemia, unspecified: Secondary | ICD-10-CM | POA: Diagnosis not present

## 2017-05-28 DIAGNOSIS — D631 Anemia in chronic kidney disease: Secondary | ICD-10-CM | POA: Diagnosis not present

## 2017-05-28 DIAGNOSIS — N2581 Secondary hyperparathyroidism of renal origin: Secondary | ICD-10-CM | POA: Diagnosis not present

## 2017-05-28 DIAGNOSIS — Z23 Encounter for immunization: Secondary | ICD-10-CM | POA: Diagnosis not present

## 2017-05-28 DIAGNOSIS — Z992 Dependence on renal dialysis: Secondary | ICD-10-CM | POA: Diagnosis not present

## 2017-05-31 DIAGNOSIS — D509 Iron deficiency anemia, unspecified: Secondary | ICD-10-CM | POA: Diagnosis not present

## 2017-05-31 DIAGNOSIS — N2581 Secondary hyperparathyroidism of renal origin: Secondary | ICD-10-CM | POA: Diagnosis not present

## 2017-05-31 DIAGNOSIS — Z23 Encounter for immunization: Secondary | ICD-10-CM | POA: Diagnosis not present

## 2017-05-31 DIAGNOSIS — Z992 Dependence on renal dialysis: Secondary | ICD-10-CM | POA: Diagnosis not present

## 2017-05-31 DIAGNOSIS — N186 End stage renal disease: Secondary | ICD-10-CM | POA: Diagnosis not present

## 2017-05-31 DIAGNOSIS — D631 Anemia in chronic kidney disease: Secondary | ICD-10-CM | POA: Diagnosis not present

## 2017-06-01 DIAGNOSIS — N186 End stage renal disease: Secondary | ICD-10-CM | POA: Diagnosis not present

## 2017-06-01 DIAGNOSIS — Z992 Dependence on renal dialysis: Secondary | ICD-10-CM | POA: Diagnosis not present

## 2017-06-02 DIAGNOSIS — Z992 Dependence on renal dialysis: Secondary | ICD-10-CM | POA: Diagnosis not present

## 2017-06-02 DIAGNOSIS — D631 Anemia in chronic kidney disease: Secondary | ICD-10-CM | POA: Diagnosis not present

## 2017-06-02 DIAGNOSIS — N186 End stage renal disease: Secondary | ICD-10-CM | POA: Diagnosis not present

## 2017-06-02 DIAGNOSIS — D509 Iron deficiency anemia, unspecified: Secondary | ICD-10-CM | POA: Diagnosis not present

## 2017-06-02 DIAGNOSIS — N2581 Secondary hyperparathyroidism of renal origin: Secondary | ICD-10-CM | POA: Diagnosis not present

## 2017-06-04 DIAGNOSIS — D509 Iron deficiency anemia, unspecified: Secondary | ICD-10-CM | POA: Diagnosis not present

## 2017-06-04 DIAGNOSIS — D631 Anemia in chronic kidney disease: Secondary | ICD-10-CM | POA: Diagnosis not present

## 2017-06-04 DIAGNOSIS — N2581 Secondary hyperparathyroidism of renal origin: Secondary | ICD-10-CM | POA: Diagnosis not present

## 2017-06-04 DIAGNOSIS — N186 End stage renal disease: Secondary | ICD-10-CM | POA: Diagnosis not present

## 2017-06-04 DIAGNOSIS — Z992 Dependence on renal dialysis: Secondary | ICD-10-CM | POA: Diagnosis not present

## 2017-06-07 DIAGNOSIS — N186 End stage renal disease: Secondary | ICD-10-CM | POA: Diagnosis not present

## 2017-06-07 DIAGNOSIS — Z992 Dependence on renal dialysis: Secondary | ICD-10-CM | POA: Diagnosis not present

## 2017-06-07 DIAGNOSIS — D631 Anemia in chronic kidney disease: Secondary | ICD-10-CM | POA: Diagnosis not present

## 2017-06-07 DIAGNOSIS — D509 Iron deficiency anemia, unspecified: Secondary | ICD-10-CM | POA: Diagnosis not present

## 2017-06-07 DIAGNOSIS — N2581 Secondary hyperparathyroidism of renal origin: Secondary | ICD-10-CM | POA: Diagnosis not present

## 2017-06-09 DIAGNOSIS — N186 End stage renal disease: Secondary | ICD-10-CM | POA: Diagnosis not present

## 2017-06-09 DIAGNOSIS — N2581 Secondary hyperparathyroidism of renal origin: Secondary | ICD-10-CM | POA: Diagnosis not present

## 2017-06-09 DIAGNOSIS — Z992 Dependence on renal dialysis: Secondary | ICD-10-CM | POA: Diagnosis not present

## 2017-06-09 DIAGNOSIS — D631 Anemia in chronic kidney disease: Secondary | ICD-10-CM | POA: Diagnosis not present

## 2017-06-09 DIAGNOSIS — D509 Iron deficiency anemia, unspecified: Secondary | ICD-10-CM | POA: Diagnosis not present

## 2017-06-11 DIAGNOSIS — D509 Iron deficiency anemia, unspecified: Secondary | ICD-10-CM | POA: Diagnosis not present

## 2017-06-11 DIAGNOSIS — N186 End stage renal disease: Secondary | ICD-10-CM | POA: Diagnosis not present

## 2017-06-11 DIAGNOSIS — D631 Anemia in chronic kidney disease: Secondary | ICD-10-CM | POA: Diagnosis not present

## 2017-06-11 DIAGNOSIS — N2581 Secondary hyperparathyroidism of renal origin: Secondary | ICD-10-CM | POA: Diagnosis not present

## 2017-06-11 DIAGNOSIS — Z992 Dependence on renal dialysis: Secondary | ICD-10-CM | POA: Diagnosis not present

## 2017-06-14 DIAGNOSIS — Z992 Dependence on renal dialysis: Secondary | ICD-10-CM | POA: Diagnosis not present

## 2017-06-14 DIAGNOSIS — N2581 Secondary hyperparathyroidism of renal origin: Secondary | ICD-10-CM | POA: Diagnosis not present

## 2017-06-14 DIAGNOSIS — D631 Anemia in chronic kidney disease: Secondary | ICD-10-CM | POA: Diagnosis not present

## 2017-06-14 DIAGNOSIS — D509 Iron deficiency anemia, unspecified: Secondary | ICD-10-CM | POA: Diagnosis not present

## 2017-06-14 DIAGNOSIS — N186 End stage renal disease: Secondary | ICD-10-CM | POA: Diagnosis not present

## 2017-06-16 DIAGNOSIS — N186 End stage renal disease: Secondary | ICD-10-CM | POA: Diagnosis not present

## 2017-06-16 DIAGNOSIS — N2581 Secondary hyperparathyroidism of renal origin: Secondary | ICD-10-CM | POA: Diagnosis not present

## 2017-06-16 DIAGNOSIS — D631 Anemia in chronic kidney disease: Secondary | ICD-10-CM | POA: Diagnosis not present

## 2017-06-16 DIAGNOSIS — D509 Iron deficiency anemia, unspecified: Secondary | ICD-10-CM | POA: Diagnosis not present

## 2017-06-16 DIAGNOSIS — Z992 Dependence on renal dialysis: Secondary | ICD-10-CM | POA: Diagnosis not present

## 2017-06-18 DIAGNOSIS — N186 End stage renal disease: Secondary | ICD-10-CM | POA: Diagnosis not present

## 2017-06-18 DIAGNOSIS — D631 Anemia in chronic kidney disease: Secondary | ICD-10-CM | POA: Diagnosis not present

## 2017-06-18 DIAGNOSIS — N2581 Secondary hyperparathyroidism of renal origin: Secondary | ICD-10-CM | POA: Diagnosis not present

## 2017-06-18 DIAGNOSIS — D509 Iron deficiency anemia, unspecified: Secondary | ICD-10-CM | POA: Diagnosis not present

## 2017-06-18 DIAGNOSIS — Z992 Dependence on renal dialysis: Secondary | ICD-10-CM | POA: Diagnosis not present

## 2017-06-21 DIAGNOSIS — Z992 Dependence on renal dialysis: Secondary | ICD-10-CM | POA: Diagnosis not present

## 2017-06-21 DIAGNOSIS — N186 End stage renal disease: Secondary | ICD-10-CM | POA: Diagnosis not present

## 2017-06-21 DIAGNOSIS — D509 Iron deficiency anemia, unspecified: Secondary | ICD-10-CM | POA: Diagnosis not present

## 2017-06-21 DIAGNOSIS — N2581 Secondary hyperparathyroidism of renal origin: Secondary | ICD-10-CM | POA: Diagnosis not present

## 2017-06-21 DIAGNOSIS — D631 Anemia in chronic kidney disease: Secondary | ICD-10-CM | POA: Diagnosis not present

## 2017-06-23 DIAGNOSIS — N2581 Secondary hyperparathyroidism of renal origin: Secondary | ICD-10-CM | POA: Diagnosis not present

## 2017-06-23 DIAGNOSIS — D631 Anemia in chronic kidney disease: Secondary | ICD-10-CM | POA: Diagnosis not present

## 2017-06-23 DIAGNOSIS — N186 End stage renal disease: Secondary | ICD-10-CM | POA: Diagnosis not present

## 2017-06-23 DIAGNOSIS — Z992 Dependence on renal dialysis: Secondary | ICD-10-CM | POA: Diagnosis not present

## 2017-06-23 DIAGNOSIS — D509 Iron deficiency anemia, unspecified: Secondary | ICD-10-CM | POA: Diagnosis not present

## 2017-06-25 DIAGNOSIS — Z992 Dependence on renal dialysis: Secondary | ICD-10-CM | POA: Diagnosis not present

## 2017-06-25 DIAGNOSIS — D631 Anemia in chronic kidney disease: Secondary | ICD-10-CM | POA: Diagnosis not present

## 2017-06-25 DIAGNOSIS — N186 End stage renal disease: Secondary | ICD-10-CM | POA: Diagnosis not present

## 2017-06-25 DIAGNOSIS — N2581 Secondary hyperparathyroidism of renal origin: Secondary | ICD-10-CM | POA: Diagnosis not present

## 2017-06-25 DIAGNOSIS — D509 Iron deficiency anemia, unspecified: Secondary | ICD-10-CM | POA: Diagnosis not present

## 2017-06-28 DIAGNOSIS — D631 Anemia in chronic kidney disease: Secondary | ICD-10-CM | POA: Diagnosis not present

## 2017-06-28 DIAGNOSIS — N186 End stage renal disease: Secondary | ICD-10-CM | POA: Diagnosis not present

## 2017-06-28 DIAGNOSIS — Z992 Dependence on renal dialysis: Secondary | ICD-10-CM | POA: Diagnosis not present

## 2017-06-28 DIAGNOSIS — N2581 Secondary hyperparathyroidism of renal origin: Secondary | ICD-10-CM | POA: Diagnosis not present

## 2017-06-28 DIAGNOSIS — D509 Iron deficiency anemia, unspecified: Secondary | ICD-10-CM | POA: Diagnosis not present

## 2017-06-30 DIAGNOSIS — D631 Anemia in chronic kidney disease: Secondary | ICD-10-CM | POA: Diagnosis not present

## 2017-06-30 DIAGNOSIS — N2581 Secondary hyperparathyroidism of renal origin: Secondary | ICD-10-CM | POA: Diagnosis not present

## 2017-06-30 DIAGNOSIS — N186 End stage renal disease: Secondary | ICD-10-CM | POA: Diagnosis not present

## 2017-06-30 DIAGNOSIS — D509 Iron deficiency anemia, unspecified: Secondary | ICD-10-CM | POA: Diagnosis not present

## 2017-06-30 DIAGNOSIS — Z992 Dependence on renal dialysis: Secondary | ICD-10-CM | POA: Diagnosis not present

## 2017-07-01 ENCOUNTER — Other Ambulatory Visit (HOSPITAL_COMMUNITY): Payer: Self-pay | Admitting: Internal Medicine

## 2017-07-01 DIAGNOSIS — Z1231 Encounter for screening mammogram for malignant neoplasm of breast: Secondary | ICD-10-CM

## 2017-07-01 DIAGNOSIS — N186 End stage renal disease: Secondary | ICD-10-CM | POA: Diagnosis not present

## 2017-07-01 DIAGNOSIS — Z992 Dependence on renal dialysis: Secondary | ICD-10-CM | POA: Diagnosis not present

## 2017-07-04 ENCOUNTER — Other Ambulatory Visit: Payer: Self-pay | Admitting: *Deleted

## 2017-07-04 DIAGNOSIS — I871 Compression of vein: Secondary | ICD-10-CM | POA: Diagnosis not present

## 2017-07-04 DIAGNOSIS — Z992 Dependence on renal dialysis: Secondary | ICD-10-CM | POA: Diagnosis not present

## 2017-07-04 DIAGNOSIS — N186 End stage renal disease: Secondary | ICD-10-CM | POA: Diagnosis not present

## 2017-07-04 DIAGNOSIS — T82868A Thrombosis of vascular prosthetic devices, implants and grafts, initial encounter: Secondary | ICD-10-CM | POA: Diagnosis not present

## 2017-07-05 ENCOUNTER — Encounter (HOSPITAL_COMMUNITY): Payer: Self-pay | Admitting: *Deleted

## 2017-07-05 ENCOUNTER — Other Ambulatory Visit: Payer: Self-pay

## 2017-07-05 DIAGNOSIS — N186 End stage renal disease: Secondary | ICD-10-CM | POA: Diagnosis not present

## 2017-07-05 DIAGNOSIS — N2581 Secondary hyperparathyroidism of renal origin: Secondary | ICD-10-CM | POA: Diagnosis not present

## 2017-07-05 DIAGNOSIS — D509 Iron deficiency anemia, unspecified: Secondary | ICD-10-CM | POA: Diagnosis not present

## 2017-07-05 DIAGNOSIS — Z992 Dependence on renal dialysis: Secondary | ICD-10-CM | POA: Diagnosis not present

## 2017-07-05 DIAGNOSIS — D631 Anemia in chronic kidney disease: Secondary | ICD-10-CM | POA: Diagnosis not present

## 2017-07-05 MED ORDER — VANCOMYCIN HCL 10 G IV SOLR
1250.0000 mg | INTRAVENOUS | Status: DC
  Filled 2017-07-05: qty 1250

## 2017-07-05 NOTE — Progress Notes (Signed)
Pt denies SOB, chest pain, and being under the care of a cardiologist. Pt denies having a cardiac acth. Pt made aware to stop taking vitamins, fish oil and herbal medications. Do not take any NSAIDs ie: Ibuprofen, Advil, Naproxen (Aleve), Motrin, BC and Goody Powder. Pt verbalized understanding of all pre-op instructions.

## 2017-07-06 ENCOUNTER — Encounter (HOSPITAL_COMMUNITY)
Admission: RE | Disposition: A | Discharge status: Home / Self Care | Payer: Self-pay | Source: Ambulatory Visit | Attending: Vascular Surgery

## 2017-07-06 ENCOUNTER — Encounter (HOSPITAL_COMMUNITY): Payer: Self-pay

## 2017-07-06 ENCOUNTER — Ambulatory Visit (HOSPITAL_COMMUNITY)
Admission: RE | Admit: 2017-07-06 | Discharge: 2017-07-06 | Disposition: A | Discharge status: Home / Self Care | Payer: Medicare Other | Source: Ambulatory Visit | Attending: Vascular Surgery | Admitting: Vascular Surgery

## 2017-07-06 ENCOUNTER — Ambulatory Visit (HOSPITAL_COMMUNITY): Payer: Medicare Other | Admitting: Certified Registered"

## 2017-07-06 DIAGNOSIS — E039 Hypothyroidism, unspecified: Secondary | ICD-10-CM | POA: Insufficient documentation

## 2017-07-06 DIAGNOSIS — N186 End stage renal disease: Secondary | ICD-10-CM | POA: Diagnosis not present

## 2017-07-06 DIAGNOSIS — K219 Gastro-esophageal reflux disease without esophagitis: Secondary | ICD-10-CM | POA: Insufficient documentation

## 2017-07-06 DIAGNOSIS — I120 Hypertensive chronic kidney disease with stage 5 chronic kidney disease or end stage renal disease: Secondary | ICD-10-CM | POA: Diagnosis not present

## 2017-07-06 DIAGNOSIS — Z79899 Other long term (current) drug therapy: Secondary | ICD-10-CM | POA: Insufficient documentation

## 2017-07-06 DIAGNOSIS — Z8673 Personal history of transient ischemic attack (TIA), and cerebral infarction without residual deficits: Secondary | ICD-10-CM | POA: Diagnosis not present

## 2017-07-06 DIAGNOSIS — N2581 Secondary hyperparathyroidism of renal origin: Secondary | ICD-10-CM | POA: Insufficient documentation

## 2017-07-06 DIAGNOSIS — N185 Chronic kidney disease, stage 5: Secondary | ICD-10-CM

## 2017-07-06 DIAGNOSIS — Z8542 Personal history of malignant neoplasm of other parts of uterus: Secondary | ICD-10-CM | POA: Insufficient documentation

## 2017-07-06 DIAGNOSIS — Z79890 Hormone replacement therapy (postmenopausal): Secondary | ICD-10-CM | POA: Diagnosis not present

## 2017-07-06 DIAGNOSIS — Y832 Surgical operation with anastomosis, bypass or graft as the cause of abnormal reaction of the patient, or of later complication, without mention of misadventure at the time of the procedure: Secondary | ICD-10-CM | POA: Insufficient documentation

## 2017-07-06 DIAGNOSIS — F419 Anxiety disorder, unspecified: Secondary | ICD-10-CM | POA: Diagnosis not present

## 2017-07-06 DIAGNOSIS — T82868A Thrombosis of vascular prosthetic devices, implants and grafts, initial encounter: Secondary | ICD-10-CM | POA: Diagnosis not present

## 2017-07-06 HISTORY — DX: Anxiety disorder, unspecified: F41.9

## 2017-07-06 HISTORY — PX: THROMBECTOMY AND REVISION OF ARTERIOVENTOUS (AV) GORETEX  GRAFT: SHX6120

## 2017-07-06 LAB — POCT I-STAT 4, (NA,K, GLUC, HGB,HCT)
GLUCOSE: 84 mg/dL (ref 65–99)
HEMATOCRIT: 38 % (ref 36.0–46.0)
HEMOGLOBIN: 12.9 g/dL (ref 12.0–15.0)
POTASSIUM: 3.9 mmol/L (ref 3.5–5.1)
SODIUM: 141 mmol/L (ref 135–145)

## 2017-07-06 SURGERY — THROMBECTOMY AND REVISION OF ARTERIOVENTOUS (AV) GORETEX  GRAFT
Anesthesia: General | Laterality: Right

## 2017-07-06 MED ORDER — FENTANYL CITRATE (PF) 250 MCG/5ML IJ SOLN
INTRAMUSCULAR | Status: AC
  Filled 2017-07-06: qty 5

## 2017-07-06 MED ORDER — PROPOFOL 10 MG/ML IV BOLUS
INTRAVENOUS | Status: DC | PRN
  Administered 2017-07-06: 80 mg via INTRAVENOUS

## 2017-07-06 MED ORDER — FENTANYL CITRATE (PF) 100 MCG/2ML IJ SOLN: 25.0000 ug | INTRAMUSCULAR | Status: DC | PRN

## 2017-07-06 MED ORDER — 0.9 % SODIUM CHLORIDE (POUR BTL) OPTIME
TOPICAL | Status: DC | PRN
  Administered 2017-07-06: 1000 mL

## 2017-07-06 MED ORDER — ROCURONIUM BROMIDE 10 MG/ML (PF) SYRINGE
PREFILLED_SYRINGE | INTRAVENOUS | Status: AC
  Filled 2017-07-06: qty 5

## 2017-07-06 MED ORDER — PROPOFOL 10 MG/ML IV BOLUS
INTRAVENOUS | Status: AC
  Filled 2017-07-06: qty 20

## 2017-07-06 MED ORDER — FENTANYL CITRATE (PF) 100 MCG/2ML IJ SOLN
INTRAMUSCULAR | Status: DC | PRN
  Administered 2017-07-06 (×4): 50 ug via INTRAVENOUS

## 2017-07-06 MED ORDER — OXYCODONE-ACETAMINOPHEN 5-325 MG PO TABS: 1.0000 | ORAL_TABLET | Freq: Four times a day (QID) | ORAL | 0 refills | Status: AC | PRN

## 2017-07-06 MED ORDER — PHENYLEPHRINE 40 MCG/ML (10ML) SYRINGE FOR IV PUSH (FOR BLOOD PRESSURE SUPPORT)
PREFILLED_SYRINGE | INTRAVENOUS | Status: AC
  Filled 2017-07-06: qty 10

## 2017-07-06 MED ORDER — LIDOCAINE HCL (CARDIAC) 20 MG/ML IV SOLN
INTRAVENOUS | Status: DC | PRN
  Administered 2017-07-06: 60 mg via INTRATRACHEAL

## 2017-07-06 MED ORDER — OXYCODONE HCL 5 MG PO TABS
5.0000 mg | ORAL_TABLET | Freq: Once | ORAL | Status: AC | PRN
  Administered 2017-07-06: 5 mg via ORAL

## 2017-07-06 MED ORDER — OXYCODONE HCL 5 MG/5ML PO SOLN: 5.0000 mg | Freq: Once | ORAL | Status: AC | PRN

## 2017-07-06 MED ORDER — SODIUM CHLORIDE 0.9 % IV SOLN
INTRAVENOUS | Status: DC
  Administered 2017-07-06: 08:00:00 via INTRAVENOUS

## 2017-07-06 MED ORDER — PHENYLEPHRINE HCL 10 MG/ML IJ SOLN
INTRAMUSCULAR | Status: DC | PRN
  Administered 2017-07-06: 120 ug via INTRAVENOUS

## 2017-07-06 MED ORDER — LIDOCAINE-EPINEPHRINE 0.5 %-1:200000 IJ SOLN
INTRAMUSCULAR | Status: AC
  Filled 2017-07-06: qty 1

## 2017-07-06 MED ORDER — OXYCODONE HCL 5 MG PO TABS
ORAL_TABLET | ORAL | Status: AC
  Filled 2017-07-06: qty 1

## 2017-07-06 MED ORDER — MIDAZOLAM HCL 5 MG/5ML IJ SOLN
INTRAMUSCULAR | Status: DC | PRN
  Administered 2017-07-06: 1 mg via INTRAVENOUS

## 2017-07-06 MED ORDER — ONDANSETRON HCL 4 MG/2ML IJ SOLN
INTRAMUSCULAR | Status: DC | PRN
  Administered 2017-07-06: 4 mg via INTRAVENOUS

## 2017-07-06 MED ORDER — HEPARIN SODIUM (PORCINE) 1000 UNIT/ML IJ SOLN
INTRAMUSCULAR | Status: AC
  Filled 2017-07-06: qty 1

## 2017-07-06 MED ORDER — LIDOCAINE 2% (20 MG/ML) 5 ML SYRINGE
INTRAMUSCULAR | Status: AC
  Filled 2017-07-06: qty 5

## 2017-07-06 MED ORDER — VANCOMYCIN HCL 1000 MG IV SOLR
INTRAVENOUS | Status: DC | PRN
  Administered 2017-07-06: 1250 mg via INTRAVENOUS

## 2017-07-06 MED ORDER — SODIUM CHLORIDE 0.9 % IV SOLN
INTRAVENOUS | Status: DC | PRN
  Administered 2017-07-06: 09:00:00 500 mL

## 2017-07-06 MED ORDER — MIDAZOLAM HCL 2 MG/2ML IJ SOLN
INTRAMUSCULAR | Status: AC
  Filled 2017-07-06: qty 2

## 2017-07-06 SURGICAL SUPPLY — 36 items
ADH SKN CLS APL DERMABOND .7 (GAUZE/BANDAGES/DRESSINGS) ×2
ARMBAND PINK RESTRICT EXTREMIT (MISCELLANEOUS) ×6 IMPLANT
CANISTER SUCT 3000ML PPV (MISCELLANEOUS) ×3 IMPLANT
CANNULA VESSEL 3MM 2 BLNT TIP (CANNULA) ×3 IMPLANT
CATH EMB 4FR 80CM (CATHETERS) ×3 IMPLANT
CLIP LIGATING EXTRA MED SLVR (CLIP) ×3 IMPLANT
CLIP LIGATING EXTRA SM BLUE (MISCELLANEOUS) ×3 IMPLANT
DECANTER SPIKE VIAL GLASS SM (MISCELLANEOUS) ×3 IMPLANT
DERMABOND ADVANCED (GAUZE/BANDAGES/DRESSINGS) ×4
DERMABOND ADVANCED .7 DNX12 (GAUZE/BANDAGES/DRESSINGS) ×1 IMPLANT
ELECT CAUTERY BLADE 6.4 (BLADE) ×3 IMPLANT
ELECT REM PT RETURN 9FT ADLT (ELECTROSURGICAL) ×3
ELECTRODE REM PT RTRN 9FT ADLT (ELECTROSURGICAL) ×1 IMPLANT
GLOVE BIO SURGEON STRL SZ7 (GLOVE) ×2 IMPLANT
GLOVE BIOGEL PI IND STRL 6.5 (GLOVE) ×2 IMPLANT
GLOVE BIOGEL PI INDICATOR 6.5 (GLOVE) ×4
GLOVE SS BIOGEL STRL SZ 7.5 (GLOVE) ×1 IMPLANT
GLOVE SUPERSENSE BIOGEL SZ 7.5 (GLOVE) ×2
GOWN STRL REUS W/ TWL LRG LVL3 (GOWN DISPOSABLE) ×3 IMPLANT
GOWN STRL REUS W/ TWL XL LVL3 (GOWN DISPOSABLE) ×1 IMPLANT
GOWN STRL REUS W/TWL LRG LVL3 (GOWN DISPOSABLE) ×6
GOWN STRL REUS W/TWL XL LVL3 (GOWN DISPOSABLE) ×3
GRAFT GORETEX STRT 4-7X45 (Vascular Products) ×3 IMPLANT
KIT BASIN OR (CUSTOM PROCEDURE TRAY) ×3 IMPLANT
KIT ROOM TURNOVER OR (KITS) ×3 IMPLANT
LOOP VESSEL MAXI BLUE (MISCELLANEOUS) ×2 IMPLANT
NS IRRIG 1000ML POUR BTL (IV SOLUTION) ×3 IMPLANT
PACK CV ACCESS (CUSTOM PROCEDURE TRAY) ×3 IMPLANT
PAD ARMBOARD 7.5X6 YLW CONV (MISCELLANEOUS) ×6 IMPLANT
SPONGE LAP 18X18 X RAY DECT (DISPOSABLE) ×2 IMPLANT
SUT PROLENE 6 0 CC (SUTURE) ×7 IMPLANT
SUT VIC AB 3-0 SH 27 (SUTURE) ×6
SUT VIC AB 3-0 SH 27X BRD (SUTURE) ×2 IMPLANT
TOWEL GREEN STERILE (TOWEL DISPOSABLE) ×3 IMPLANT
UNDERPAD 30X30 (UNDERPADS AND DIAPERS) ×3 IMPLANT
WATER STERILE IRR 1000ML POUR (IV SOLUTION) ×3 IMPLANT

## 2017-07-06 NOTE — Anesthesia Postprocedure Evaluation (Addendum)
Anesthesia Post Note  Patient: Sonya [name]  Procedure(s) Performed: THROMBECTOMY AND REVISION OF ARTERIOVENOUS (AV) GORETEX  GRAFT RIGHT UPPER ARM (Right )     Patient location during evaluation: PACU Anesthesia Type: General Level of consciousness: awake and alert Pain management: pain level controlled Vital Signs Assessment: post-procedure vital signs reviewed and stable Respiratory status: spontaneous breathing, nonlabored ventilation, respiratory function stable and patient connected to nasal cannula oxygen Cardiovascular status: stable and blood pressure returned to baseline Postop Assessment: no apparent nausea or vomiting Anesthetic complications: no    Last Vitals:  Vitals:   07/06/17 1130 07/06/17 1202  BP: 125/70 130/75  Pulse: 95 75  Resp: 12   Temp: (!) 36.3 C   SpO2: 100% 100%    Last Pain:  Vitals:   07/06/17 1130  TempSrc:   PainSc: 4                  Jerra Huckeby

## 2017-07-06 NOTE — Anesthesia Preprocedure Evaluation (Signed)
Anesthesia Evaluation  Patient identified by MRN, date of birth, ID band Patient awake    Reviewed: Allergy & Precautions, NPO status , Patient's Chart, lab work & pertinent test results  Airway Mallampati: II  TM Distance: >3 FB Neck ROM: Full    Dental  (+) Edentulous Lower, Edentulous Upper   Pulmonary neg pulmonary ROS,    breath sounds clear to auscultation       Cardiovascular hypertension, Pt. on medications (-) angina(-) Past MI and (-) CHF  Rhythm:Regular     Neuro/Psych  Headaches, PSYCHIATRIC DISORDERS Anxiety CVA    GI/Hepatic Neg liver ROS, GERD  Medicated and Controlled,  Endo/Other  Hypothyroidism   Renal/GU ESRF and DialysisRenal disease     Musculoskeletal   Abdominal   Peds  Hematology  (+) anemia ,   Anesthesia Other Findings   Reproductive/Obstetrics                             Anesthesia Physical Anesthesia Plan  ASA: III  Anesthesia Plan: MAC   Post-op Pain Management:    Induction: Intravenous  PONV Risk Score and Plan: 2 and Ondansetron and Treatment may vary due to age or medical condition  Airway Management Planned: Nasal Cannula  Additional Equipment: None  Intra-op Plan:   Post-operative Plan:   Informed Consent: I have reviewed the patients History and Physical, chart, labs and discussed the procedure including the risks, benefits and alternatives for the proposed anesthesia with the patient or authorized representative who has indicated his/her understanding and acceptance.   Dental advisory given  Plan Discussed with: CRNA and Surgeon  Anesthesia Plan Comments:         Anesthesia Quick Evaluation

## 2017-07-06 NOTE — Anesthesia Procedure Notes (Signed)
Procedure Name: LMA Insertion Date/Time: 07/06/2017 8:39 AM Performed by: Lance Coon, CRNA Pre-anesthesia Checklist: Patient identified, Emergency Drugs available, Suction available, Patient being monitored and Timeout performed Patient Re-evaluated:Patient Re-evaluated prior to induction Oxygen Delivery Method: Circle system utilized Preoxygenation: Pre-oxygenation with 100% oxygen Induction Type: IV induction LMA: LMA inserted LMA Size: 4.0 Number of attempts: 1 Placement Confirmation: breath sounds checked- equal and bilateral and positive ETCO2 Tube secured with: Tape Dental Injury: Teeth and Oropharynx as per pre-operative assessment

## 2017-07-06 NOTE — Transfer of Care (Signed)
Immediate Anesthesia Transfer of Care Note  Patient: Sonya [name]  Procedure(s) Performed: THROMBECTOMY AND REVISION OF ARTERIOVENOUS (AV) GORETEX  GRAFT RIGHT UPPER ARM (Right )  Patient Location: PACU  Anesthesia Type:General  Level of Consciousness: awake and patient cooperative  Airway & Oxygen Therapy: Patient Spontanous Breathing  Post-op Assessment: Report given to RN and Post -op Vital signs reviewed and stable  Post vital signs: Reviewed and stable  Last Vitals:  Vitals:   07/06/17 0708 07/06/17 1022  BP: (!) 142/75   Pulse: 94   Resp: 16   Temp: 36.9 C (!) (P) 36.1 C  SpO2: 100%     Last Pain:  Vitals:   07/06/17 0708  TempSrc: Oral  PainSc:       Patients Stated Pain Goal: 3 (83/15/17 6160)  Complications: No apparent anesthesia complications

## 2017-07-06 NOTE — Op Note (Signed)
    OPERATIVE REPORT  DATE OF SURGERY: 07/06/2017  PATIENT: Sonya [name], 66 y.o. female MRN: [id number]  DOB: February 24, 1951  PRE-OPERATIVE DIAGNOSIS: End-stage renal disease with thrombosed right arm AV fistula  POST-OPERATIVE DIAGNOSIS:  Same  PROCEDURE: New right upper arm AV Gore-Tex graft  SURGEON:  Curt Jews, M.D.  PHYSICIAN ASSISTANT: Matt Eveland PA-C  ANESTHESIA: General  EBL: Minimal ml  Total I/O In: 500 [I.V.:500] Out: -   BLOOD ADMINISTERED: None  DRAINS: None  SPECIMEN: None  COUNTS CORRECT:  YES  PLAN OF CARE: PACU  PATIENT DISPOSITION:  PACU - hemodynamically stable  PROCEDURE DETAILS: Patient was taken to the operative placed supine position where the area of the right arm and right axilla were prepped and draped in usual sterile fashion.  Incision space and carried down to isolate the brachial artery and the prior venous anastomosis.  The fistula had extensive aneurysmal degeneration brachial artery was also hypertrophied.  The artery was encircled proximally distally L venous anastomosis.  Next a separate incision was made in the axilla and carried down to isolate the axillary vein which was of good caliber.  A tunnel was createdto the axilla lateral to the old fistula.  A 7 x 4 Gore-Tex graft was brought through the tunnel.  The brachial artery was occluded proximally and distal to the old venous and acted near the anastomosis.  The Gore-Tex graft was transected where the 6 mm portion was a spatulated and sewn end-to-side to the old vein at the arterial anastomosis.  This was with a running 6-0 Prolene suture.  Clamps were removed and excellent anastomosis hemostasis was encountered.  This was flushed with heparinized saline and reoccluded.  Next the graft been brought to the year prior created tunnel was cut the appropriate length and was sewn end-to-side to the axillary vein with a running 6-0 Prolene suture.  Clamps were removed and excellent thrill was  noted.  The basilic vein was ligated incision.  Basilic fistula was resected at the antecubital space and thrombus was removed from the degenerated fistula the wounds were irrigated with saline.  Hemostasis obtained with cautery.  The wounds were closed with 3-0 Vicryl in the subcutaneous and subcuticular tissue.  Sterile dressing was applied and the patient was transferred to the recovery room in stable condition   Sonya [name], M.D., [facility] 07/06/2017 10:22 AM

## 2017-07-06 NOTE — Discharge Instructions (Signed)
° °  [facility] ° °Discharge Instructions ° °AV Fistula or Graft Surgery for Dialysis Access ° °Please refer to the following instructions for your post-procedure care. Your surgeon or physician assistant will discuss any changes with you. ° °Activity ° °You may drive the day following your surgery, if you are comfortable and no longer taking prescription pain medication. Resume full activity as the soreness in your incision resolves. ° °Bathing/Showering ° °You may shower after you go home. Keep your incision dry for 48 hours. Do not soak in a bathtub, hot tub, or swim until the incision heals completely. You may not shower if you have a hemodialysis catheter. ° °Incision Care ° °Clean your incision with mild soap and water after 48 hours. Pat the area dry with a clean towel. You do not need a bandage unless otherwise instructed. Do not apply any ointments or creams to your incision. You may have skin glue on your incision. Do not peel it off. It will come off on its own in about one week. Your arm may swell a bit after surgery. To reduce swelling use pillows to elevate your arm so it is above your heart. Your doctor will tell you if you need to lightly wrap your arm with an ACE bandage. ° °Diet ° °Resume your normal diet. There are not special food restrictions following this procedure. In order to heal from your surgery, it is CRITICAL to get adequate nutrition. Your body requires vitamins, minerals, and protein. Vegetables are the best source of vitamins and minerals. Vegetables also provide the perfect balance of protein. Processed food has little nutritional value, so try to avoid this. ° °Medications ° °Resume taking all of your medications. If your incision is causing pain, you may take over-the counter pain relievers such as acetaminophen (Tylenol). If you were prescribed a stronger pain medication, please be aware these medications can cause nausea and constipation. Prevent  nausea by taking the medication with a snack or meal. Avoid constipation by drinking plenty of fluids and eating foods with high amount of fiber, such as fruits, vegetables, and grains. Do not take Tylenol if you are taking prescription pain medications. ° ° ° ° °Follow up °Your surgeon may want to see you in the office following your access surgery. If so, this will be arranged at the time of your surgery. ° °Please call us immediately for any of the following conditions: ° °Increased pain, redness, drainage (pus) from your incision site °Fever of 101 degrees or higher °Severe or worsening pain at your incision site °Hand pain or numbness. ° °Reduce your risk of vascular disease: ° °Stop smoking. If you would like help, call [facility] at 1-800-QUIT-NOW (1-800-784-8669) or [place] at [phone] ° °Manage your cholesterol °Maintain a desired weight °Control your diabetes °Keep your blood pressure down ° °Dialysis ° °It will take several weeks to several months for your new dialysis access to be ready for use. Your surgeon will determine when it is OK to use it. Your nephrologist will continue to direct your dialysis. You can continue to use your Permcath until your new access is ready for use. ° °If you have any questions, please call the office at [phone]. ° °

## 2017-07-06 NOTE — H&P (Signed)
[facility]  Patient name: Sonya [name] MRN: [id number] DOB: Feb 11, 1951 Sex: female    HPI: Sonya [name] is a 66 y.o. female here today for outpatient surgery for new right arm access.  She has had a right basilic vein transposition fistula which is thrombosed.  Unsuccessfully attempted to be opened at [facility] and therefore was catheter was placed.  She is here today for treatment  Past Medical History:  Diagnosis Date  . Anemia   . Anxiety   . CKD (chronic kidney disease)   . Diverticulitis   . Dysphagia   . ESRD (end stage renal disease) (HCC)    On HD  . Fracture of femoral neck, left, closed (HCC) 05/30/2015  . Gastritis 06/2013 & 05/2015  . Gastroesophageal reflux disease with stricture 05/2015  . GERD (gastroesophageal reflux disease)   . HTN (hypertension)   . Hyperparathyroidism due to renal insufficiency (HCC)   . Hypertensive urgency 06/2013 & 04/2015  . Hypothyroidism   . ICH (intracerebral hemorrhage) (HCC) 10/2014  . Meningocele (HCC)   . Protein calorie malnutrition (HCC)   . Stroke (HCC)   . Uterine cancer (HCC) 2012  . Vertical diplopia   . Vertigo     Family History  Problem Relation Age of Onset  . Cancer Mother   . Diabetes Mother   . Hypertension Mother   . Diabetes Father   . Hypertension Father   . Hypertension Sister   . Colon cancer Neg Hx     SOCIAL HISTORY: Social History   Tobacco Use  . Smoking status: Never Smoker  . Smokeless tobacco: Never Used  Substance Use Topics  . Alcohol use: No    Allergies  Allergen Reactions  . Cephalexin Itching    Current Facility-Administered Medications  Medication Dose Route Frequency Provider Last Rate Last Dose  . 0.9 %  sodium chloride infusion   Intravenous Continuous Tagen Brethauer, Kristen Loader, MD      . vancomycin (VANCOCIN) 1,250 mg in sodium chloride 0.9 % 250 mL IVPB  1,250 mg Intravenous To OR Laconya Clere, Kristen Loader, MD         REVIEW OF SYSTEMS:  [X]  denotes positive finding, [ ]  denotes negative finding Cardiac  Comments:  Chest pain or chest pressure:    Shortness of breath upon exertion:    Short of breath when lying flat:    Irregular heart rhythm:        Vascular    Pain in calf, thigh, or hip brought on by ambulation:    Pain in feet at night that wakes you up from your sleep:     Blood clot in your veins:    Leg swelling:           PHYSICAL EXAM: Vitals:   07/06/17 0647 07/06/17 0708  BP:  (!) 142/75  Pulse:  94  Resp:  16  Temp:  98.5 F (36.9 C)  TempSrc:  Oral  SpO2:  100%  Weight: 135 lb (61.2 kg)   Height: 5\' 5"  (1.651 m)     GENERAL: The patient is a well-nourished female, in no acute distress. The vital signs are documented above. CARDIOVASCULAR: Palpable right radial pulse.  Aneurysmal right upper arm basilic vein fistula. PULMONARY: There is good air exchange  MUSCULOSKELETAL: There are no major deformities or cyanosis. NEUROLOGIC: No focal weakness or paresthesias are detected. SKIN: There are no ulcers or rashes noted. PSYCHIATRIC: The patient has a normal  affect.  DATA:  None  MEDICAL ISSUES: Discussed with patient.  We will plan new right upper arm AV graft.  Explained that we would explore her fistula but think it would be very unlikely that this could be salvageable due to the severe aneurysmal degeneration.    Larina Earthly, MD FACS [facility] Tel [phone] Pager [phone]

## 2017-07-07 ENCOUNTER — Encounter (HOSPITAL_COMMUNITY): Payer: Self-pay | Admitting: Vascular Surgery

## 2017-07-07 DIAGNOSIS — D631 Anemia in chronic kidney disease: Secondary | ICD-10-CM | POA: Diagnosis not present

## 2017-07-07 DIAGNOSIS — N2581 Secondary hyperparathyroidism of renal origin: Secondary | ICD-10-CM | POA: Diagnosis not present

## 2017-07-07 DIAGNOSIS — N186 End stage renal disease: Secondary | ICD-10-CM | POA: Diagnosis not present

## 2017-07-07 DIAGNOSIS — Z992 Dependence on renal dialysis: Secondary | ICD-10-CM | POA: Diagnosis not present

## 2017-07-07 DIAGNOSIS — D509 Iron deficiency anemia, unspecified: Secondary | ICD-10-CM | POA: Diagnosis not present

## 2017-07-08 ENCOUNTER — Ambulatory Visit (HOSPITAL_COMMUNITY): Payer: Medicare Other

## 2017-07-09 DIAGNOSIS — N2581 Secondary hyperparathyroidism of renal origin: Secondary | ICD-10-CM | POA: Diagnosis not present

## 2017-07-09 DIAGNOSIS — N186 End stage renal disease: Secondary | ICD-10-CM | POA: Diagnosis not present

## 2017-07-09 DIAGNOSIS — Z992 Dependence on renal dialysis: Secondary | ICD-10-CM | POA: Diagnosis not present

## 2017-07-09 DIAGNOSIS — D509 Iron deficiency anemia, unspecified: Secondary | ICD-10-CM | POA: Diagnosis not present

## 2017-07-09 DIAGNOSIS — D631 Anemia in chronic kidney disease: Secondary | ICD-10-CM | POA: Diagnosis not present

## 2017-07-12 DIAGNOSIS — Z992 Dependence on renal dialysis: Secondary | ICD-10-CM | POA: Diagnosis not present

## 2017-07-12 DIAGNOSIS — D509 Iron deficiency anemia, unspecified: Secondary | ICD-10-CM | POA: Diagnosis not present

## 2017-07-12 DIAGNOSIS — N186 End stage renal disease: Secondary | ICD-10-CM | POA: Diagnosis not present

## 2017-07-12 DIAGNOSIS — N2581 Secondary hyperparathyroidism of renal origin: Secondary | ICD-10-CM | POA: Diagnosis not present

## 2017-07-12 DIAGNOSIS — D631 Anemia in chronic kidney disease: Secondary | ICD-10-CM | POA: Diagnosis not present

## 2017-07-14 DIAGNOSIS — N186 End stage renal disease: Secondary | ICD-10-CM | POA: Diagnosis not present

## 2017-07-14 DIAGNOSIS — D631 Anemia in chronic kidney disease: Secondary | ICD-10-CM | POA: Diagnosis not present

## 2017-07-14 DIAGNOSIS — Z992 Dependence on renal dialysis: Secondary | ICD-10-CM | POA: Diagnosis not present

## 2017-07-14 DIAGNOSIS — D509 Iron deficiency anemia, unspecified: Secondary | ICD-10-CM | POA: Diagnosis not present

## 2017-07-14 DIAGNOSIS — N2581 Secondary hyperparathyroidism of renal origin: Secondary | ICD-10-CM | POA: Diagnosis not present

## 2017-07-16 DIAGNOSIS — D509 Iron deficiency anemia, unspecified: Secondary | ICD-10-CM | POA: Diagnosis not present

## 2017-07-16 DIAGNOSIS — D631 Anemia in chronic kidney disease: Secondary | ICD-10-CM | POA: Diagnosis not present

## 2017-07-16 DIAGNOSIS — N2581 Secondary hyperparathyroidism of renal origin: Secondary | ICD-10-CM | POA: Diagnosis not present

## 2017-07-16 DIAGNOSIS — N186 End stage renal disease: Secondary | ICD-10-CM | POA: Diagnosis not present

## 2017-07-16 DIAGNOSIS — Z992 Dependence on renal dialysis: Secondary | ICD-10-CM | POA: Diagnosis not present

## 2017-07-19 DIAGNOSIS — D509 Iron deficiency anemia, unspecified: Secondary | ICD-10-CM | POA: Diagnosis not present

## 2017-07-19 DIAGNOSIS — N186 End stage renal disease: Secondary | ICD-10-CM | POA: Diagnosis not present

## 2017-07-19 DIAGNOSIS — Z992 Dependence on renal dialysis: Secondary | ICD-10-CM | POA: Diagnosis not present

## 2017-07-19 DIAGNOSIS — N2581 Secondary hyperparathyroidism of renal origin: Secondary | ICD-10-CM | POA: Diagnosis not present

## 2017-07-19 DIAGNOSIS — D631 Anemia in chronic kidney disease: Secondary | ICD-10-CM | POA: Diagnosis not present

## 2017-07-21 DIAGNOSIS — D509 Iron deficiency anemia, unspecified: Secondary | ICD-10-CM | POA: Diagnosis not present

## 2017-07-21 DIAGNOSIS — N2581 Secondary hyperparathyroidism of renal origin: Secondary | ICD-10-CM | POA: Diagnosis not present

## 2017-07-21 DIAGNOSIS — D631 Anemia in chronic kidney disease: Secondary | ICD-10-CM | POA: Diagnosis not present

## 2017-07-21 DIAGNOSIS — Z992 Dependence on renal dialysis: Secondary | ICD-10-CM | POA: Diagnosis not present

## 2017-07-21 DIAGNOSIS — N186 End stage renal disease: Secondary | ICD-10-CM | POA: Diagnosis not present

## 2017-07-23 DIAGNOSIS — N186 End stage renal disease: Secondary | ICD-10-CM | POA: Diagnosis not present

## 2017-07-23 DIAGNOSIS — D631 Anemia in chronic kidney disease: Secondary | ICD-10-CM | POA: Diagnosis not present

## 2017-07-23 DIAGNOSIS — Z992 Dependence on renal dialysis: Secondary | ICD-10-CM | POA: Diagnosis not present

## 2017-07-23 DIAGNOSIS — D509 Iron deficiency anemia, unspecified: Secondary | ICD-10-CM | POA: Diagnosis not present

## 2017-07-23 DIAGNOSIS — N2581 Secondary hyperparathyroidism of renal origin: Secondary | ICD-10-CM | POA: Diagnosis not present

## 2017-07-25 DIAGNOSIS — D509 Iron deficiency anemia, unspecified: Secondary | ICD-10-CM | POA: Diagnosis not present

## 2017-07-25 DIAGNOSIS — N186 End stage renal disease: Secondary | ICD-10-CM | POA: Diagnosis not present

## 2017-07-25 DIAGNOSIS — D631 Anemia in chronic kidney disease: Secondary | ICD-10-CM | POA: Diagnosis not present

## 2017-07-25 DIAGNOSIS — N2581 Secondary hyperparathyroidism of renal origin: Secondary | ICD-10-CM | POA: Diagnosis not present

## 2017-07-25 DIAGNOSIS — Z992 Dependence on renal dialysis: Secondary | ICD-10-CM | POA: Diagnosis not present

## 2017-07-28 DIAGNOSIS — N186 End stage renal disease: Secondary | ICD-10-CM | POA: Diagnosis not present

## 2017-07-28 DIAGNOSIS — D631 Anemia in chronic kidney disease: Secondary | ICD-10-CM | POA: Diagnosis not present

## 2017-07-28 DIAGNOSIS — N2581 Secondary hyperparathyroidism of renal origin: Secondary | ICD-10-CM | POA: Diagnosis not present

## 2017-07-28 DIAGNOSIS — Z992 Dependence on renal dialysis: Secondary | ICD-10-CM | POA: Diagnosis not present

## 2017-07-28 DIAGNOSIS — D509 Iron deficiency anemia, unspecified: Secondary | ICD-10-CM | POA: Diagnosis not present

## 2017-07-30 DIAGNOSIS — N186 End stage renal disease: Secondary | ICD-10-CM | POA: Diagnosis not present

## 2017-07-30 DIAGNOSIS — N2581 Secondary hyperparathyroidism of renal origin: Secondary | ICD-10-CM | POA: Diagnosis not present

## 2017-07-30 DIAGNOSIS — Z992 Dependence on renal dialysis: Secondary | ICD-10-CM | POA: Diagnosis not present

## 2017-07-30 DIAGNOSIS — D509 Iron deficiency anemia, unspecified: Secondary | ICD-10-CM | POA: Diagnosis not present

## 2017-07-30 DIAGNOSIS — D631 Anemia in chronic kidney disease: Secondary | ICD-10-CM | POA: Diagnosis not present

## 2017-08-01 DIAGNOSIS — Z992 Dependence on renal dialysis: Secondary | ICD-10-CM | POA: Diagnosis not present

## 2017-08-01 DIAGNOSIS — N186 End stage renal disease: Secondary | ICD-10-CM | POA: Diagnosis not present

## 2017-08-02 DIAGNOSIS — D631 Anemia in chronic kidney disease: Secondary | ICD-10-CM | POA: Diagnosis not present

## 2017-08-02 DIAGNOSIS — N186 End stage renal disease: Secondary | ICD-10-CM | POA: Diagnosis not present

## 2017-08-02 DIAGNOSIS — N2581 Secondary hyperparathyroidism of renal origin: Secondary | ICD-10-CM | POA: Diagnosis not present

## 2017-08-02 DIAGNOSIS — D509 Iron deficiency anemia, unspecified: Secondary | ICD-10-CM | POA: Diagnosis not present

## 2017-08-02 DIAGNOSIS — Z992 Dependence on renal dialysis: Secondary | ICD-10-CM | POA: Diagnosis not present

## 2017-08-04 DIAGNOSIS — Z992 Dependence on renal dialysis: Secondary | ICD-10-CM | POA: Diagnosis not present

## 2017-08-04 DIAGNOSIS — D509 Iron deficiency anemia, unspecified: Secondary | ICD-10-CM | POA: Diagnosis not present

## 2017-08-04 DIAGNOSIS — N186 End stage renal disease: Secondary | ICD-10-CM | POA: Diagnosis not present

## 2017-08-04 DIAGNOSIS — N2581 Secondary hyperparathyroidism of renal origin: Secondary | ICD-10-CM | POA: Diagnosis not present

## 2017-08-04 DIAGNOSIS — D631 Anemia in chronic kidney disease: Secondary | ICD-10-CM | POA: Diagnosis not present

## 2017-08-06 DIAGNOSIS — N2581 Secondary hyperparathyroidism of renal origin: Secondary | ICD-10-CM | POA: Diagnosis not present

## 2017-08-06 DIAGNOSIS — D631 Anemia in chronic kidney disease: Secondary | ICD-10-CM | POA: Diagnosis not present

## 2017-08-06 DIAGNOSIS — Z992 Dependence on renal dialysis: Secondary | ICD-10-CM | POA: Diagnosis not present

## 2017-08-06 DIAGNOSIS — N186 End stage renal disease: Secondary | ICD-10-CM | POA: Diagnosis not present

## 2017-08-06 DIAGNOSIS — D509 Iron deficiency anemia, unspecified: Secondary | ICD-10-CM | POA: Diagnosis not present

## 2017-08-09 DIAGNOSIS — Z992 Dependence on renal dialysis: Secondary | ICD-10-CM | POA: Diagnosis not present

## 2017-08-09 DIAGNOSIS — D509 Iron deficiency anemia, unspecified: Secondary | ICD-10-CM | POA: Diagnosis not present

## 2017-08-09 DIAGNOSIS — N186 End stage renal disease: Secondary | ICD-10-CM | POA: Diagnosis not present

## 2017-08-09 DIAGNOSIS — N2581 Secondary hyperparathyroidism of renal origin: Secondary | ICD-10-CM | POA: Diagnosis not present

## 2017-08-09 DIAGNOSIS — D631 Anemia in chronic kidney disease: Secondary | ICD-10-CM | POA: Diagnosis not present

## 2017-08-10 DIAGNOSIS — H02889 Meibomian gland dysfunction of unspecified eye, unspecified eyelid: Secondary | ICD-10-CM | POA: Diagnosis not present

## 2017-08-11 DIAGNOSIS — D631 Anemia in chronic kidney disease: Secondary | ICD-10-CM | POA: Diagnosis not present

## 2017-08-11 DIAGNOSIS — N186 End stage renal disease: Secondary | ICD-10-CM | POA: Diagnosis not present

## 2017-08-11 DIAGNOSIS — Z992 Dependence on renal dialysis: Secondary | ICD-10-CM | POA: Diagnosis not present

## 2017-08-11 DIAGNOSIS — D509 Iron deficiency anemia, unspecified: Secondary | ICD-10-CM | POA: Diagnosis not present

## 2017-08-11 DIAGNOSIS — N2581 Secondary hyperparathyroidism of renal origin: Secondary | ICD-10-CM | POA: Diagnosis not present

## 2017-08-13 DIAGNOSIS — N186 End stage renal disease: Secondary | ICD-10-CM | POA: Diagnosis not present

## 2017-08-13 DIAGNOSIS — N2581 Secondary hyperparathyroidism of renal origin: Secondary | ICD-10-CM | POA: Diagnosis not present

## 2017-08-13 DIAGNOSIS — D509 Iron deficiency anemia, unspecified: Secondary | ICD-10-CM | POA: Diagnosis not present

## 2017-08-13 DIAGNOSIS — Z992 Dependence on renal dialysis: Secondary | ICD-10-CM | POA: Diagnosis not present

## 2017-08-13 DIAGNOSIS — D631 Anemia in chronic kidney disease: Secondary | ICD-10-CM | POA: Diagnosis not present

## 2017-08-16 DIAGNOSIS — D509 Iron deficiency anemia, unspecified: Secondary | ICD-10-CM | POA: Diagnosis not present

## 2017-08-16 DIAGNOSIS — D631 Anemia in chronic kidney disease: Secondary | ICD-10-CM | POA: Diagnosis not present

## 2017-08-16 DIAGNOSIS — N186 End stage renal disease: Secondary | ICD-10-CM | POA: Diagnosis not present

## 2017-08-16 DIAGNOSIS — N2581 Secondary hyperparathyroidism of renal origin: Secondary | ICD-10-CM | POA: Diagnosis not present

## 2017-08-16 DIAGNOSIS — Z992 Dependence on renal dialysis: Secondary | ICD-10-CM | POA: Diagnosis not present

## 2017-08-18 ENCOUNTER — Other Ambulatory Visit: Payer: Self-pay | Admitting: Gastroenterology

## 2017-08-18 DIAGNOSIS — N186 End stage renal disease: Secondary | ICD-10-CM | POA: Diagnosis not present

## 2017-08-18 DIAGNOSIS — N2581 Secondary hyperparathyroidism of renal origin: Secondary | ICD-10-CM | POA: Diagnosis not present

## 2017-08-18 DIAGNOSIS — D631 Anemia in chronic kidney disease: Secondary | ICD-10-CM | POA: Diagnosis not present

## 2017-08-18 DIAGNOSIS — D509 Iron deficiency anemia, unspecified: Secondary | ICD-10-CM | POA: Diagnosis not present

## 2017-08-18 DIAGNOSIS — Z992 Dependence on renal dialysis: Secondary | ICD-10-CM | POA: Diagnosis not present

## 2017-08-18 NOTE — Telephone Encounter (Signed)
Patient called and stated that she needs another refill of her anxiety meds

## 2017-08-18 NOTE — Telephone Encounter (Signed)
Forwarding to Dr.Fields.  

## 2017-08-19 DIAGNOSIS — M159 Polyosteoarthritis, unspecified: Secondary | ICD-10-CM | POA: Diagnosis not present

## 2017-08-19 DIAGNOSIS — K219 Gastro-esophageal reflux disease without esophagitis: Secondary | ICD-10-CM | POA: Diagnosis not present

## 2017-08-19 DIAGNOSIS — Z681 Body mass index (BMI) 19 or less, adult: Secondary | ICD-10-CM | POA: Diagnosis not present

## 2017-08-19 DIAGNOSIS — E785 Hyperlipidemia, unspecified: Secondary | ICD-10-CM | POA: Diagnosis not present

## 2017-08-19 DIAGNOSIS — F419 Anxiety disorder, unspecified: Secondary | ICD-10-CM | POA: Diagnosis not present

## 2017-08-19 DIAGNOSIS — Z1389 Encounter for screening for other disorder: Secondary | ICD-10-CM | POA: Diagnosis not present

## 2017-08-19 DIAGNOSIS — I10 Essential (primary) hypertension: Secondary | ICD-10-CM | POA: Diagnosis not present

## 2017-08-19 DIAGNOSIS — M109 Gout, unspecified: Secondary | ICD-10-CM | POA: Diagnosis not present

## 2017-08-19 MED ORDER — ALPRAZOLAM 0.5 MG PO TABS
0.5000 mg | ORAL_TABLET | Freq: Two times a day (BID) | ORAL | 3 refills | Status: DC
Start: 2017-08-19 — End: 2017-09-29

## 2017-08-19 NOTE — Addendum Note (Signed)
Addended by: Barney Drain L on: 08/19/2017 10:00 AM   Modules accepted: Orders

## 2017-08-19 NOTE — Telephone Encounter (Signed)
PLEASE CALL PT. WILL REFILL XANAX BUT SHE WILL NEED TO GET FUTURE REFILLS FROM HER PCP OR NEPHROLOGIST.

## 2017-08-19 NOTE — Telephone Encounter (Signed)
Tried to call pt. Many rings and no answer. Mailing a letter that Xanax was refilled but she needs to get future refills from PCP or Nephrologist.

## 2017-08-20 DIAGNOSIS — D631 Anemia in chronic kidney disease: Secondary | ICD-10-CM | POA: Diagnosis not present

## 2017-08-20 DIAGNOSIS — N2581 Secondary hyperparathyroidism of renal origin: Secondary | ICD-10-CM | POA: Diagnosis not present

## 2017-08-20 DIAGNOSIS — N186 End stage renal disease: Secondary | ICD-10-CM | POA: Diagnosis not present

## 2017-08-20 DIAGNOSIS — Z992 Dependence on renal dialysis: Secondary | ICD-10-CM | POA: Diagnosis not present

## 2017-08-20 DIAGNOSIS — D509 Iron deficiency anemia, unspecified: Secondary | ICD-10-CM | POA: Diagnosis not present

## 2017-08-23 DIAGNOSIS — N186 End stage renal disease: Secondary | ICD-10-CM | POA: Diagnosis not present

## 2017-08-23 DIAGNOSIS — Z992 Dependence on renal dialysis: Secondary | ICD-10-CM | POA: Diagnosis not present

## 2017-08-23 DIAGNOSIS — D631 Anemia in chronic kidney disease: Secondary | ICD-10-CM | POA: Diagnosis not present

## 2017-08-23 DIAGNOSIS — N2581 Secondary hyperparathyroidism of renal origin: Secondary | ICD-10-CM | POA: Diagnosis not present

## 2017-08-23 DIAGNOSIS — D509 Iron deficiency anemia, unspecified: Secondary | ICD-10-CM | POA: Diagnosis not present

## 2017-08-25 DIAGNOSIS — D631 Anemia in chronic kidney disease: Secondary | ICD-10-CM | POA: Diagnosis not present

## 2017-08-25 DIAGNOSIS — N186 End stage renal disease: Secondary | ICD-10-CM | POA: Diagnosis not present

## 2017-08-25 DIAGNOSIS — Z992 Dependence on renal dialysis: Secondary | ICD-10-CM | POA: Diagnosis not present

## 2017-08-25 DIAGNOSIS — D509 Iron deficiency anemia, unspecified: Secondary | ICD-10-CM | POA: Diagnosis not present

## 2017-08-25 DIAGNOSIS — N2581 Secondary hyperparathyroidism of renal origin: Secondary | ICD-10-CM | POA: Diagnosis not present

## 2017-08-26 DIAGNOSIS — Z452 Encounter for adjustment and management of vascular access device: Secondary | ICD-10-CM | POA: Diagnosis not present

## 2017-08-27 DIAGNOSIS — D631 Anemia in chronic kidney disease: Secondary | ICD-10-CM | POA: Diagnosis not present

## 2017-08-27 DIAGNOSIS — D509 Iron deficiency anemia, unspecified: Secondary | ICD-10-CM | POA: Diagnosis not present

## 2017-08-27 DIAGNOSIS — N186 End stage renal disease: Secondary | ICD-10-CM | POA: Diagnosis not present

## 2017-08-27 DIAGNOSIS — Z992 Dependence on renal dialysis: Secondary | ICD-10-CM | POA: Diagnosis not present

## 2017-08-27 DIAGNOSIS — N2581 Secondary hyperparathyroidism of renal origin: Secondary | ICD-10-CM | POA: Diagnosis not present

## 2017-08-30 DIAGNOSIS — D509 Iron deficiency anemia, unspecified: Secondary | ICD-10-CM | POA: Diagnosis not present

## 2017-08-30 DIAGNOSIS — Z992 Dependence on renal dialysis: Secondary | ICD-10-CM | POA: Diagnosis not present

## 2017-08-30 DIAGNOSIS — N2581 Secondary hyperparathyroidism of renal origin: Secondary | ICD-10-CM | POA: Diagnosis not present

## 2017-08-30 DIAGNOSIS — N186 End stage renal disease: Secondary | ICD-10-CM | POA: Diagnosis not present

## 2017-08-30 DIAGNOSIS — D631 Anemia in chronic kidney disease: Secondary | ICD-10-CM | POA: Diagnosis not present

## 2017-09-01 DIAGNOSIS — D631 Anemia in chronic kidney disease: Secondary | ICD-10-CM | POA: Diagnosis not present

## 2017-09-01 DIAGNOSIS — D509 Iron deficiency anemia, unspecified: Secondary | ICD-10-CM | POA: Diagnosis not present

## 2017-09-01 DIAGNOSIS — Z992 Dependence on renal dialysis: Secondary | ICD-10-CM | POA: Diagnosis not present

## 2017-09-01 DIAGNOSIS — N186 End stage renal disease: Secondary | ICD-10-CM | POA: Diagnosis not present

## 2017-09-01 DIAGNOSIS — N2581 Secondary hyperparathyroidism of renal origin: Secondary | ICD-10-CM | POA: Diagnosis not present

## 2017-09-02 DIAGNOSIS — D509 Iron deficiency anemia, unspecified: Secondary | ICD-10-CM | POA: Diagnosis not present

## 2017-09-02 DIAGNOSIS — Z992 Dependence on renal dialysis: Secondary | ICD-10-CM | POA: Diagnosis not present

## 2017-09-02 DIAGNOSIS — N186 End stage renal disease: Secondary | ICD-10-CM | POA: Diagnosis not present

## 2017-09-03 DIAGNOSIS — N186 End stage renal disease: Secondary | ICD-10-CM | POA: Diagnosis not present

## 2017-09-03 DIAGNOSIS — D509 Iron deficiency anemia, unspecified: Secondary | ICD-10-CM | POA: Diagnosis not present

## 2017-09-03 DIAGNOSIS — Z992 Dependence on renal dialysis: Secondary | ICD-10-CM | POA: Diagnosis not present

## 2017-09-05 DIAGNOSIS — N186 End stage renal disease: Secondary | ICD-10-CM | POA: Diagnosis not present

## 2017-09-05 DIAGNOSIS — Z992 Dependence on renal dialysis: Secondary | ICD-10-CM | POA: Diagnosis not present

## 2017-09-05 DIAGNOSIS — D509 Iron deficiency anemia, unspecified: Secondary | ICD-10-CM | POA: Diagnosis not present

## 2017-09-06 DIAGNOSIS — Z992 Dependence on renal dialysis: Secondary | ICD-10-CM | POA: Diagnosis not present

## 2017-09-06 DIAGNOSIS — N186 End stage renal disease: Secondary | ICD-10-CM | POA: Diagnosis not present

## 2017-09-06 DIAGNOSIS — D509 Iron deficiency anemia, unspecified: Secondary | ICD-10-CM | POA: Diagnosis not present

## 2017-09-08 DIAGNOSIS — D509 Iron deficiency anemia, unspecified: Secondary | ICD-10-CM | POA: Diagnosis not present

## 2017-09-08 DIAGNOSIS — N186 End stage renal disease: Secondary | ICD-10-CM | POA: Diagnosis not present

## 2017-09-08 DIAGNOSIS — Z992 Dependence on renal dialysis: Secondary | ICD-10-CM | POA: Diagnosis not present

## 2017-09-10 DIAGNOSIS — D509 Iron deficiency anemia, unspecified: Secondary | ICD-10-CM | POA: Diagnosis not present

## 2017-09-10 DIAGNOSIS — N186 End stage renal disease: Secondary | ICD-10-CM | POA: Diagnosis not present

## 2017-09-10 DIAGNOSIS — Z992 Dependence on renal dialysis: Secondary | ICD-10-CM | POA: Diagnosis not present

## 2017-09-13 DIAGNOSIS — N186 End stage renal disease: Secondary | ICD-10-CM | POA: Diagnosis not present

## 2017-09-13 DIAGNOSIS — Z992 Dependence on renal dialysis: Secondary | ICD-10-CM | POA: Diagnosis not present

## 2017-09-13 DIAGNOSIS — D509 Iron deficiency anemia, unspecified: Secondary | ICD-10-CM | POA: Diagnosis not present

## 2017-09-15 DIAGNOSIS — Z992 Dependence on renal dialysis: Secondary | ICD-10-CM | POA: Diagnosis not present

## 2017-09-15 DIAGNOSIS — D509 Iron deficiency anemia, unspecified: Secondary | ICD-10-CM | POA: Diagnosis not present

## 2017-09-15 DIAGNOSIS — N186 End stage renal disease: Secondary | ICD-10-CM | POA: Diagnosis not present

## 2017-09-17 DIAGNOSIS — N186 End stage renal disease: Secondary | ICD-10-CM | POA: Diagnosis not present

## 2017-09-17 DIAGNOSIS — Z992 Dependence on renal dialysis: Secondary | ICD-10-CM | POA: Diagnosis not present

## 2017-09-17 DIAGNOSIS — D509 Iron deficiency anemia, unspecified: Secondary | ICD-10-CM | POA: Diagnosis not present

## 2017-09-18 ENCOUNTER — Encounter (HOSPITAL_COMMUNITY): Payer: Self-pay | Admitting: Emergency Medicine

## 2017-09-18 ENCOUNTER — Emergency Department (HOSPITAL_COMMUNITY)
Admission: EM | Admit: 2017-09-18 | Discharge: 2017-09-18 | Disposition: A | Discharge status: Home / Self Care | Payer: Medicare Other | Attending: Emergency Medicine | Admitting: Emergency Medicine

## 2017-09-18 ENCOUNTER — Other Ambulatory Visit: Payer: Self-pay

## 2017-09-18 ENCOUNTER — Emergency Department (HOSPITAL_COMMUNITY): Payer: Medicare Other

## 2017-09-18 DIAGNOSIS — F419 Anxiety disorder, unspecified: Secondary | ICD-10-CM | POA: Insufficient documentation

## 2017-09-18 DIAGNOSIS — E039 Hypothyroidism, unspecified: Secondary | ICD-10-CM | POA: Diagnosis not present

## 2017-09-18 DIAGNOSIS — Z9049 Acquired absence of other specified parts of digestive tract: Secondary | ICD-10-CM | POA: Insufficient documentation

## 2017-09-18 DIAGNOSIS — Z79899 Other long term (current) drug therapy: Secondary | ICD-10-CM | POA: Diagnosis not present

## 2017-09-18 DIAGNOSIS — R1030 Lower abdominal pain, unspecified: Secondary | ICD-10-CM | POA: Diagnosis not present

## 2017-09-18 DIAGNOSIS — Z8673 Personal history of transient ischemic attack (TIA), and cerebral infarction without residual deficits: Secondary | ICD-10-CM | POA: Diagnosis not present

## 2017-09-18 DIAGNOSIS — M25551 Pain in right hip: Secondary | ICD-10-CM

## 2017-09-18 DIAGNOSIS — Z8542 Personal history of malignant neoplasm of other parts of uterus: Secondary | ICD-10-CM | POA: Diagnosis not present

## 2017-09-18 DIAGNOSIS — N186 End stage renal disease: Secondary | ICD-10-CM | POA: Insufficient documentation

## 2017-09-18 DIAGNOSIS — I120 Hypertensive chronic kidney disease with stage 5 chronic kidney disease or end stage renal disease: Secondary | ICD-10-CM | POA: Diagnosis not present

## 2017-09-18 DIAGNOSIS — Z992 Dependence on renal dialysis: Secondary | ICD-10-CM | POA: Diagnosis not present

## 2017-09-18 DIAGNOSIS — M1611 Unilateral primary osteoarthritis, right hip: Secondary | ICD-10-CM | POA: Diagnosis not present

## 2017-09-18 DIAGNOSIS — R1031 Right lower quadrant pain: Secondary | ICD-10-CM | POA: Diagnosis present

## 2017-09-18 LAB — CBG MONITORING, ED
GLUCOSE-CAPILLARY: 53 mg/dL — AB (ref 65–99)
Glucose-Capillary: 73 mg/dL (ref 65–99)

## 2017-09-18 NOTE — ED Provider Notes (Signed)
[facility] EMERGENCY DEPARTMENT Provider Note   CSN: [id number] Arrival date & time: 09/18/17  9323     History   Chief Complaint Chief Complaint  Patient presents with  . Leg Pain    HPI IZZIE GEERS is a 67 y.o. female with a history of chronic kidney disease on dialysis, GERD, hypertension, distant history of gout and history of hypoglycemia presenting with a one-week history of right groin pain which is worsened with movement and weightbearing and asymptomatic at rest.  At baseline she ambulates with a walker and has been able to weight-bear but has persistent pain despite no known injuries or falls. She reports gradual onset of symptoms this past week. There is no radiation of pain.  She reports having a "knot" in her right groin which was evaluated by her pcp who stated it was "nothing" and don't worry abut it unless it hurts. She cannot recall the diagnosis and denies being able to find the knot at this time. She has used blue emu ointment topically with transient improvement in her symptoms.  There is no radiation of pain.  She denies low back pain, weakness or numbness in her lower extremities.  She does have a history of the left hip fracture with surgical repair within the past several years.  HPI  Past Medical History:  Diagnosis Date  . Anemia   . Anxiety   . CKD (chronic kidney disease)   . Diverticulitis   . Dysphagia   . ESRD (end stage renal disease) ([place])    On HD  . Fracture of femoral neck, left, closed ([place]) 05/30/2015  . Gastritis 06/2013 & 05/2015  . Gastroesophageal reflux disease with stricture 05/2015  . GERD (gastroesophageal reflux disease)   . HTN (hypertension)   . Hyperparathyroidism due to renal insufficiency ([place])   . Hypertensive urgency 06/2013 & 04/2015  . Hypothyroidism   . ICH (intracerebral hemorrhage) ([place]) 10/2014  . Meningocele ([place])   . Protein calorie malnutrition ([place])   . Stroke ([place])   . Uterine cancer ([place]) 2012  . Vertical  diplopia   . Vertigo     Patient Active Problem List   Diagnosis Date Noted  . Anxiety state 02/03/2017  . Malnutrition of moderate degree 01/19/2017  . Hypoglycemia 01/18/2017  . 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 ESRD (end stage renal disease) ([place])   . Abnormal weight loss 12/01/2015  . Pulmonary edema 08/19/2015  . CAP (community acquired pneumonia) 08/19/2015  . Hypothyroidism, adult 06/14/2015  . HA (headache) 06/13/2015  . HTN (hypertension), malignant 06/13/2015  . Elective surgery   . Fracture of femoral neck, left, closed ([place]) 05/30/2015  . Hip fracture ([place]) 05/30/2015  . Closed fracture of neck of left femur ([place])   . UTI (lower urinary tract infection) 04/14/2015  . Orthostatic hypotension 11/27/2014  . Acute encephalopathy   . Confusion   . Protein-calorie malnutrition, severe ([place]) 10/15/2014  . Dysphagia 10/15/2014  . Intracerebral bleed ([place]) 10/14/2014  . End stage renal disease ([place]) 08/31/2013  . Symptomatic anemia 08/18/2013  . Hyperparathyroidism due to renal insufficiency ([place]) 07/09/2013  . Constipation 06/21/2013  . Gastritis 06/21/2013  . Chronic kidney disease 06/20/2013  . Anemia in chronic kidney disease 06/08/2013  . Gout 06/08/2013  . Other dysphagia 06/07/2013  . Colon cancer screening 04/08/2011    Past Surgical History:  Procedure Laterality Date  . ABDOMINAL HYSTERECTOMY    . APPENDECTOMY    . BASCILIC VEIN TRANSPOSITION Right 09/17/2013  Procedure: BRACHIAL VEIN TRANSPOSITION;  Surgeon: Conrad Westfield, MD;  Location: [place];  Service: Vascular;  Laterality: Right;  . BASCILIC VEIN TRANSPOSITION Right 10/29/2013   Procedure: RIGHT SECOND STAGE BRACHIAL VEIN TRANSPOSITION;  Surgeon: Conrad Lonoke, MD;  Location: [place];  Service: Vascular;  Laterality: Right;  . CESAREAN SECTION    . CHOLECYSTECTOMY    . COLONOSCOPY  2000   TICS, IH  . COLONOSCOPY  2003 NUR BRBPR D50 V6   DC/[place] TICS, IH  . COLONOSCOPY N/A 09/04/2013   SLF: Small  ulcer in the descending colon, one colon polyp (tubular adenoma), large internal hemorrhoids, moderate diverticulosis. next tcs 10 years.  . ESOPHAGOGASTRODUODENOSCOPY N/A 05/23/2015   KGY:JEHU non-erosive gastritis/stricture at the gastroesophageal junction  . ESOPHAGOGASTRODUODENOSCOPY (EGD) WITH ESOPHAGEAL DILATION N/A 06/08/2013   Dr. Fields:Stricture at the gastroesophageal junction/MILD NON-erosive gastritis (inflammation) was found in the gastric antrum; multiple biopsies/NO SOURCE FOR ANEMIA IDETIFIED-MOST LIKELY DUE TO ANEMIA OF CHRONIC DISEASE  . FLEXIBLE SIGMOIDOSCOPY N/A 01/02/2016   Dr. Oneida Alar: external hemorrhoids, diverticulosis, internal hemorrhoids, proximal rectum normal  . GIVENS CAPSULE STUDY N/A 12/03/2013   SLF: occasional gastric erosion. small bowel normal  . HIP PINNING,CANNULATED Left 05/31/2015   Procedure: CANNULATED HIP PINNING;  Surgeon: Leandrew Koyanagi, MD;  Location: [place];  Service: Orthopedics;  Laterality: Left;  . INSERTION OF DIALYSIS CATHETER Left 09/17/2013   Procedure: INSERTION OF DIALYSIS CATHETER;  Surgeon: Conrad Willards, MD;  Location: [place];  Service: Vascular;  Laterality: Left;  . LAPAROSCOPIC TOTAL HYSTERECTOMY    . NASAL SEPTUM SURGERY    . SAVORY DILATION N/A 05/23/2015   Procedure: SAVORY DILATION;  Surgeon: Danie Binder, MD;  [facility];  Service: Endoscopy;  Laterality: N/A;  . THROMBECTOMY AND REVISION OF ARTERIOVENTOUS (AV) GORETEX  GRAFT Right 07/06/2017   Procedure: THROMBECTOMY AND REVISION OF ARTERIOVENOUS (AV) GORETEX  GRAFT RIGHT UPPER ARM;  Surgeon: Rosetta Posner, MD;  Location: [facility];  Service: Vascular;  Laterality: Right;    OB History    Gravida Para Term Preterm AB Living   1 1 1          SAB TAB Ectopic Multiple Live Births                   Home Medications    Prior to Admission medications   Medication Sig Start Date End Date Taking? Authorizing Provider  allopurinol (ZYLOPRIM) 300 MG tablet Take 0.5 tablets  (150 mg total) by mouth daily. Dose was decreased secondary to renal failure. Patient taking differently: Take 300 mg by mouth daily. Dose was decreased secondary to renal failure. 06/16/15  Yes Rexene Alberts, MD  ALPRAZolam Duanne Moron) 0.5 MG tablet Take 1 tablet (0.5 mg total) by mouth 2 (two) times daily. 08/19/17  Yes Fields, Sandi L, MD  atorvastatin (LIPITOR) 10 MG tablet Take 1 tablet by mouth daily. 08/22/17  Yes [provider]  cinacalcet (SENSIPAR) 30 MG tablet Take 30 mg by mouth daily.   Yes [provider]  folic acid (FOLVITE) 1 MG tablet Take 2 mg by mouth daily.   Yes [provider]  levothyroxine (SYNTHROID, LEVOTHROID) 50 MCG tablet Take 1 tablet (50 mcg total) by mouth daily. Patient taking differently: Take 50 mcg by mouth daily before breakfast.  04/17/15  Yes Samuella Cota, MD  lidocaine-prilocaine (EMLA) cream Apply 1 application topically every Tuesday, Thursday, and Saturday at 6 PM.    Yes [provider]  NIFEdipine (PROCARDIA XL/ADALAT-CC) 60 MG 24 hr tablet Take 60 mg by mouth at bedtime.  04/10/15  Yes [provider]  pantoprazole (PROTONIX) 40 MG tablet Take 1 tablet (40 mg total) by mouth 2 (two) times daily before a meal. 02/04/17 09/18/17 Yes Mahala Menghini, PA-C  Vitamin D, Ergocalciferol, (DRISDOL) 50000 units CAPS capsule Take 50,000 Units by mouth every 7 (seven) days.   Yes [provider]    Family History Family History  Problem Relation Age of Onset  . Cancer Mother   . Diabetes Mother   . Hypertension Mother   . Diabetes Father   . Hypertension Father   . Hypertension Sister   . Colon cancer Neg Hx     Social History Social History   Tobacco Use  . Smoking status: Never Smoker  . Smokeless tobacco: Never Used  Substance Use Topics  . Alcohol use: No  . Drug use: No     Allergies   Cephalexin   Review of Systems Review of Systems  Constitutional: Negative for fever.    Genitourinary: Negative.   Musculoskeletal: Positive for arthralgias. Negative for joint swelling and myalgias.  Neurological: Negative for weakness and numbness.     Physical Exam Updated Vital Signs BP 104/72 (BP Location: Right Arm)   Pulse 79   Temp 97.9 F (36.6 C) (Oral)   Resp 18   Ht 5\' 4"  (1.626 m)   Wt 68 kg (150 lb)   SpO2 100%   BMI 25.75 kg/m   Physical Exam  Constitutional: She appears well-developed and well-nourished.  HENT:  Head: Atraumatic.  Neck: Normal range of motion.  Cardiovascular:  Pulses equal bilaterally  Abdominal: Soft. Normal appearance. She exhibits no mass.  Musculoskeletal: She exhibits no tenderness.       Right hip: She exhibits decreased range of motion. She exhibits no swelling, no crepitus and no deformity.       Right knee: No tenderness found.       Lumbar back: Normal. She exhibits no bony tenderness.  No pain with palpation in patient's right lower quadrant, hip or groin.  No edema or erythema present.  She does have pain with active and passive range of motion, specifically triggered by hip external rotation.  She has no knee pain.  Her thigh is soft and nontender.  Neurological: She is alert. She has normal strength. She displays normal reflexes. No sensory deficit.  Skin: Skin is warm and dry.  Psychiatric: She has a normal mood and affect.     ED Treatments / Results  Labs (all labs ordered are listed, but only abnormal results are displayed) Labs Reviewed  CBG MONITORING, ED - Abnormal; Notable for the following components:      Result Value   Glucose-Capillary 53 (*)    All other components within normal limits    EKG  EKG Interpretation None       Radiology Dg Hip Unilat W Or Wo Pelvis 2-3 Views Right  Result Date: 09/18/2017 CLINICAL DATA:  Right groin pain 1 week.  No injury. EXAM: DG HIP (WITH OR WITHOUT PELVIS) 2-3V RIGHT COMPARISON:  05/30/2015 and CT 11/20/2016 FINDINGS: Three screws bridging the left  femoral neck into the femoral head intact. Mild symmetric degenerative change of the hips. Mild diffuse osteopenia. No acute fracture or dislocation. Mild degenerate change of the spine. IMPRESSION: No acute findings. Electronically Signed   By: Marin Olp M.D.   On: 09/18/2017 09:57  Procedures Procedures (including critical care time)  Medications Ordered in ED Medications - No data to display   Initial Impression / Assessment and Plan / ED Course  I have reviewed the triage vital signs and the nursing notes.  Pertinent labs & imaging results that were available during my care of the patient were reviewed by me and considered in my medical decision making (see chart for details).     Patient was offered pain medication.  She deferred, stating it does not hurt unless she walks. Imaging reviewed and discussed with pt and dg at bedside.  Pt also seen by Dr. Jeanell Sparrow during this visit. Exam and hx c/w mechanical joint pain.  No erythema, edema to suggest infectious or gouty source of pain. Heat, tylenol, f/u with pcp (has appt in 3 days). Pt also asked for referral to Dr Aline Brochure which was provided.  Final Clinical Impressions(s) / ED Diagnoses   Final diagnoses:  Pain of right hip joint  Primary osteoarthritis of right hip    ED Discharge Orders    None       Landis Martins 09/18/17 1055    Pattricia Boss, MD 09/18/17 1511

## 2017-09-18 NOTE — Discharge Instructions (Signed)
You may benefit from applying a heating pad set on a low setting to your hip area for 20 minutes several times daily.  The best first line treatment for arthritis pain is tylenol. I recommend taking 500 mg every 6 hours if needed for pain relief.

## 2017-09-18 NOTE — ED Triage Notes (Signed)
Patient c/o right leg pain that started x1 week ago. Denies any injury. Per patient worse with standing and walking. Patient states pain starts in groin and radiates into thisgh. Denies any swelling. Patient using blue star ointment with no relief.

## 2017-09-20 DIAGNOSIS — N186 End stage renal disease: Secondary | ICD-10-CM | POA: Diagnosis not present

## 2017-09-20 DIAGNOSIS — D509 Iron deficiency anemia, unspecified: Secondary | ICD-10-CM | POA: Diagnosis not present

## 2017-09-20 DIAGNOSIS — Z992 Dependence on renal dialysis: Secondary | ICD-10-CM | POA: Diagnosis not present

## 2017-09-22 DIAGNOSIS — N186 End stage renal disease: Secondary | ICD-10-CM | POA: Diagnosis not present

## 2017-09-22 DIAGNOSIS — Z992 Dependence on renal dialysis: Secondary | ICD-10-CM | POA: Diagnosis not present

## 2017-09-22 DIAGNOSIS — D509 Iron deficiency anemia, unspecified: Secondary | ICD-10-CM | POA: Diagnosis not present

## 2017-09-24 DIAGNOSIS — D509 Iron deficiency anemia, unspecified: Secondary | ICD-10-CM | POA: Diagnosis not present

## 2017-09-24 DIAGNOSIS — Z992 Dependence on renal dialysis: Secondary | ICD-10-CM | POA: Diagnosis not present

## 2017-09-24 DIAGNOSIS — N186 End stage renal disease: Secondary | ICD-10-CM | POA: Diagnosis not present

## 2017-09-26 ENCOUNTER — Emergency Department (HOSPITAL_COMMUNITY): Payer: Medicare Other

## 2017-09-26 ENCOUNTER — Encounter (HOSPITAL_COMMUNITY): Payer: Self-pay

## 2017-09-26 ENCOUNTER — Inpatient Hospital Stay (HOSPITAL_COMMUNITY)
Admission: EM | Admit: 2017-09-26 | Discharge: 2017-10-02 | DRG: 064 | Disposition: A | Discharge status: Home Health | Payer: Medicare Other | Attending: Internal Medicine | Admitting: Internal Medicine

## 2017-09-26 DIAGNOSIS — E872 Acidosis, unspecified: Secondary | ICD-10-CM | POA: Diagnosis present

## 2017-09-26 DIAGNOSIS — N186 End stage renal disease: Secondary | ICD-10-CM | POA: Diagnosis present

## 2017-09-26 DIAGNOSIS — I120 Hypertensive chronic kidney disease with stage 5 chronic kidney disease or end stage renal disease: Secondary | ICD-10-CM | POA: Diagnosis not present

## 2017-09-26 DIAGNOSIS — R4189 Other symptoms and signs involving cognitive functions and awareness: Secondary | ICD-10-CM | POA: Diagnosis present

## 2017-09-26 DIAGNOSIS — M109 Gout, unspecified: Secondary | ICD-10-CM | POA: Diagnosis present

## 2017-09-26 DIAGNOSIS — E871 Hypo-osmolality and hyponatremia: Secondary | ICD-10-CM | POA: Diagnosis not present

## 2017-09-26 DIAGNOSIS — Z9049 Acquired absence of other specified parts of digestive tract: Secondary | ICD-10-CM

## 2017-09-26 DIAGNOSIS — Z8249 Family history of ischemic heart disease and other diseases of the circulatory system: Secondary | ICD-10-CM

## 2017-09-26 DIAGNOSIS — Z833 Family history of diabetes mellitus: Secondary | ICD-10-CM

## 2017-09-26 DIAGNOSIS — R402142 Coma scale, eyes open, spontaneous, at arrival to emergency department: Secondary | ICD-10-CM | POA: Diagnosis present

## 2017-09-26 DIAGNOSIS — G9341 Metabolic encephalopathy: Secondary | ICD-10-CM | POA: Diagnosis not present

## 2017-09-26 DIAGNOSIS — Z881 Allergy status to other antibiotic agents status: Secondary | ICD-10-CM

## 2017-09-26 DIAGNOSIS — M79605 Pain in left leg: Secondary | ICD-10-CM | POA: Diagnosis not present

## 2017-09-26 DIAGNOSIS — I672 Cerebral atherosclerosis: Secondary | ICD-10-CM | POA: Diagnosis not present

## 2017-09-26 DIAGNOSIS — E162 Hypoglycemia, unspecified: Secondary | ICD-10-CM | POA: Diagnosis not present

## 2017-09-26 DIAGNOSIS — R402242 Coma scale, best verbal response, confused conversation, at arrival to emergency department: Secondary | ICD-10-CM | POA: Diagnosis present

## 2017-09-26 DIAGNOSIS — F0390 Unspecified dementia without behavioral disturbance: Secondary | ICD-10-CM | POA: Diagnosis present

## 2017-09-26 DIAGNOSIS — I639 Cerebral infarction, unspecified: Principal | ICD-10-CM | POA: Diagnosis present

## 2017-09-26 DIAGNOSIS — E876 Hypokalemia: Secondary | ICD-10-CM | POA: Diagnosis not present

## 2017-09-26 DIAGNOSIS — Z8673 Personal history of transient ischemic attack (TIA), and cerebral infarction without residual deficits: Secondary | ICD-10-CM

## 2017-09-26 DIAGNOSIS — D631 Anemia in chronic kidney disease: Secondary | ICD-10-CM | POA: Diagnosis present

## 2017-09-26 DIAGNOSIS — S0990XA Unspecified injury of head, initial encounter: Secondary | ICD-10-CM | POA: Diagnosis not present

## 2017-09-26 DIAGNOSIS — R402362 Coma scale, best motor response, obeys commands, at arrival to emergency department: Secondary | ICD-10-CM | POA: Diagnosis present

## 2017-09-26 DIAGNOSIS — R410 Disorientation, unspecified: Secondary | ICD-10-CM | POA: Diagnosis present

## 2017-09-26 DIAGNOSIS — F419 Anxiety disorder, unspecified: Secondary | ICD-10-CM | POA: Diagnosis present

## 2017-09-26 DIAGNOSIS — Z809 Family history of malignant neoplasm, unspecified: Secondary | ICD-10-CM

## 2017-09-26 DIAGNOSIS — N189 Chronic kidney disease, unspecified: Secondary | ICD-10-CM

## 2017-09-26 DIAGNOSIS — R4182 Altered mental status, unspecified: Secondary | ICD-10-CM | POA: Diagnosis not present

## 2017-09-26 DIAGNOSIS — R29701 NIHSS score 1: Secondary | ICD-10-CM | POA: Diagnosis present

## 2017-09-26 DIAGNOSIS — R918 Other nonspecific abnormal finding of lung field: Secondary | ICD-10-CM | POA: Diagnosis not present

## 2017-09-26 DIAGNOSIS — Z79890 Hormone replacement therapy (postmenopausal): Secondary | ICD-10-CM

## 2017-09-26 DIAGNOSIS — E161 Other hypoglycemia: Secondary | ICD-10-CM | POA: Diagnosis not present

## 2017-09-26 DIAGNOSIS — E785 Hyperlipidemia, unspecified: Secondary | ICD-10-CM | POA: Diagnosis present

## 2017-09-26 DIAGNOSIS — K222 Esophageal obstruction: Secondary | ICD-10-CM

## 2017-09-26 DIAGNOSIS — I10 Essential (primary) hypertension: Secondary | ICD-10-CM | POA: Diagnosis present

## 2017-09-26 DIAGNOSIS — I6501 Occlusion and stenosis of right vertebral artery: Secondary | ICD-10-CM | POA: Diagnosis present

## 2017-09-26 DIAGNOSIS — E039 Hypothyroidism, unspecified: Secondary | ICD-10-CM | POA: Diagnosis present

## 2017-09-26 DIAGNOSIS — G9340 Encephalopathy, unspecified: Secondary | ICD-10-CM

## 2017-09-26 DIAGNOSIS — Z90710 Acquired absence of both cervix and uterus: Secondary | ICD-10-CM

## 2017-09-26 DIAGNOSIS — K219 Gastro-esophageal reflux disease without esophagitis: Secondary | ICD-10-CM | POA: Diagnosis present

## 2017-09-26 DIAGNOSIS — N2581 Secondary hyperparathyroidism of renal origin: Secondary | ICD-10-CM | POA: Diagnosis not present

## 2017-09-26 DIAGNOSIS — Z992 Dependence on renal dialysis: Secondary | ICD-10-CM

## 2017-09-26 LAB — CBC WITH DIFFERENTIAL/PLATELET
BASOS ABS: 0 10*3/uL (ref 0.0–0.1)
Basophils Relative: 0 %
Eosinophils Absolute: 0 10*3/uL (ref 0.0–0.7)
Eosinophils Relative: 0 %
HCT: 37.2 % (ref 36.0–46.0)
Hemoglobin: 11.8 g/dL — ABNORMAL LOW (ref 12.0–15.0)
LYMPHS ABS: 1.4 10*3/uL (ref 0.7–4.0)
Lymphocytes Relative: 12 %
MCH: 23.6 pg — AB (ref 26.0–34.0)
MCHC: 31.7 g/dL (ref 30.0–36.0)
MCV: 74.5 fL — ABNORMAL LOW (ref 78.0–100.0)
MONO ABS: 0.8 10*3/uL (ref 0.1–1.0)
Monocytes Relative: 7 %
NEUTROS ABS: 9.5 10*3/uL — AB (ref 1.7–7.7)
Neutrophils Relative %: 81 %
Platelets: 253 10*3/uL (ref 150–400)
RBC: 4.99 MIL/uL (ref 3.87–5.11)
RDW: 17.7 % — AB (ref 11.5–15.5)
WBC: 11.7 10*3/uL — AB (ref 4.0–10.5)

## 2017-09-26 LAB — COMPREHENSIVE METABOLIC PANEL
ALT: 8 U/L — AB (ref 14–54)
AST: 23 U/L (ref 15–41)
Albumin: 3.2 g/dL — ABNORMAL LOW (ref 3.5–5.0)
Alkaline Phosphatase: 76 U/L (ref 38–126)
Anion gap: 19 — ABNORMAL HIGH (ref 5–15)
BUN: 50 mg/dL — AB (ref 6–20)
CHLORIDE: 96 mmol/L — AB (ref 101–111)
CO2: 21 mmol/L — ABNORMAL LOW (ref 22–32)
CREATININE: 7.2 mg/dL — AB (ref 0.44–1.00)
Calcium: 9.2 mg/dL (ref 8.9–10.3)
GFR calc non Af Amer: 5 mL/min — ABNORMAL LOW (ref >60)
GFR, EST AFRICAN AMERICAN: 6 mL/min — AB (ref >60)
Glucose, Bld: 122 mg/dL — ABNORMAL HIGH (ref 65–99)
Potassium: 3.6 mmol/L (ref 3.5–5.1)
Sodium: 136 mmol/L (ref 135–145)
TOTAL PROTEIN: 8.1 g/dL (ref 6.5–8.1)
Total Bilirubin: 0.6 mg/dL (ref 0.3–1.2)

## 2017-09-26 LAB — CBG MONITORING, ED
GLUCOSE-CAPILLARY: 82 mg/dL (ref 65–99)
Glucose-Capillary: 73 mg/dL (ref 65–99)
Glucose-Capillary: 92 mg/dL (ref 65–99)

## 2017-09-26 LAB — LACTIC ACID, PLASMA
LACTIC ACID, VENOUS: 3.1 mmol/L — AB (ref 0.5–1.9)
Lactic Acid, Venous: 2.8 mmol/L (ref 0.5–1.9)

## 2017-09-26 LAB — TROPONIN I

## 2017-09-26 LAB — LIPASE, BLOOD: Lipase: 52 U/L — ABNORMAL HIGH (ref 11–51)

## 2017-09-26 LAB — ETHANOL

## 2017-09-26 MED ORDER — ONDANSETRON HCL 4 MG/2ML IJ SOLN: 4.0000 mg | Freq: Four times a day (QID) | INTRAMUSCULAR | Status: DC | PRN

## 2017-09-26 MED ORDER — FOLIC ACID 1 MG PO TABS
2.0000 mg | ORAL_TABLET | Freq: Every day | ORAL | Status: DC
  Administered 2017-09-27 – 2017-10-02 (×6): 2 mg via ORAL
  Filled 2017-09-26 (×6): qty 2

## 2017-09-26 MED ORDER — SODIUM CHLORIDE 0.9 % IV SOLN: INTRAVENOUS | Status: DC

## 2017-09-26 MED ORDER — DEXTROSE 10 % IV SOLN
INTRAVENOUS | Status: DC
Start: 2017-09-26 — End: 2017-09-30
  Administered 2017-09-26 – 2017-09-28 (×2): via INTRAVENOUS

## 2017-09-26 MED ORDER — SODIUM CHLORIDE 0.9 % IV BOLUS (SEPSIS)
500.0000 mL | Freq: Once | INTRAVENOUS | Status: AC
  Administered 2017-09-26: 500 mL via INTRAVENOUS

## 2017-09-26 MED ORDER — NIFEDIPINE ER OSMOTIC RELEASE 30 MG PO TB24
60.0000 mg | ORAL_TABLET | Freq: Every day | ORAL | Status: DC
  Administered 2017-09-26 – 2017-09-28 (×3): 60 mg via ORAL
  Filled 2017-09-26 (×3): qty 2

## 2017-09-26 MED ORDER — ENOXAPARIN SODIUM 30 MG/0.3ML ~~LOC~~ SOLN
30.0000 mg | SUBCUTANEOUS | Status: DC
  Filled 2017-09-26: qty 0.3

## 2017-09-26 MED ORDER — ALLOPURINOL 300 MG PO TABS
300.0000 mg | ORAL_TABLET | Freq: Every day | ORAL | Status: DC
  Filled 2017-09-26 (×2): qty 1

## 2017-09-26 MED ORDER — LIDOCAINE-PRILOCAINE 2.5-2.5 % EX CREA
1.0000 "application " | TOPICAL_CREAM | CUTANEOUS | Status: DC
  Filled 2017-09-26: qty 5

## 2017-09-26 MED ORDER — ALPRAZOLAM 0.5 MG PO TABS
0.5000 mg | ORAL_TABLET | Freq: Two times a day (BID) | ORAL | Status: DC | PRN
  Administered 2017-09-26 – 2017-10-01 (×5): 0.5 mg via ORAL
  Filled 2017-09-26 (×6): qty 1

## 2017-09-26 MED ORDER — VITAMIN D (ERGOCALCIFEROL) 1.25 MG (50000 UNIT) PO CAPS: 50000.0000 [IU] | ORAL_CAPSULE | ORAL | Status: DC

## 2017-09-26 MED ORDER — LEVOTHYROXINE SODIUM 50 MCG PO TABS
50.0000 ug | ORAL_TABLET | Freq: Every day | ORAL | Status: DC
  Administered 2017-09-27 – 2017-10-02 (×6): 50 ug via ORAL
  Filled 2017-09-26 (×6): qty 1

## 2017-09-26 MED ORDER — CARBOXYMETHYLCELLULOSE SODIUM 0.25 % OP SOLN: 1.0000 [drp] | Freq: Every day | OPHTHALMIC | Status: DC | PRN

## 2017-09-26 MED ORDER — ONDANSETRON HCL 4 MG PO TABS: 4.0000 mg | ORAL_TABLET | Freq: Four times a day (QID) | ORAL | Status: DC | PRN

## 2017-09-26 MED ORDER — ATORVASTATIN CALCIUM 10 MG PO TABS
10.0000 mg | ORAL_TABLET | Freq: Every day | ORAL | Status: DC
  Administered 2017-09-27 – 2017-09-28 (×2): 10 mg via ORAL
  Filled 2017-09-26 (×3): qty 1

## 2017-09-26 MED ORDER — DEXTROSE 50 % IV SOLN: 12.5000 g | Freq: Once | INTRAVENOUS | Status: DC

## 2017-09-26 MED ORDER — CINACALCET HCL 30 MG PO TABS
30.0000 mg | ORAL_TABLET | Freq: Every day | ORAL | Status: DC
  Administered 2017-09-27 – 2017-09-29 (×3): 30 mg via ORAL
  Filled 2017-09-26 (×4): qty 1

## 2017-09-26 MED ORDER — PANTOPRAZOLE SODIUM 40 MG PO TBEC
40.0000 mg | DELAYED_RELEASE_TABLET | Freq: Two times a day (BID) | ORAL | Status: DC
  Administered 2017-09-27 – 2017-10-02 (×10): 40 mg via ORAL
  Filled 2017-09-26 (×10): qty 1

## 2017-09-26 NOTE — ED Notes (Signed)
Notified AC for dose of d 10

## 2017-09-26 NOTE — ED Notes (Signed)
CRITICAL VALUE ALERT  Critical Value:  Lactic acid 3.1  Date & Time Notied:  09/26/17, 1657  Provider Notified: Dr. Rogene Houston  Orders Received/Actions taken: no new orders at this time

## 2017-09-26 NOTE — ED Notes (Addendum)
CRITICAL VALUE STICKER  CRITICAL VALUE: lactic, 2.7  RECEIVER (on-site recipient of call):Mandrell Vangilder g, rn  DATE & TIME NOTIFIED:  09/26/17 2210   MD NOTIFIED: Olevia Bowens  RESPONSE: Acknowledged

## 2017-09-26 NOTE — ED Notes (Signed)
Pt in MRI at this time, will obtain lactic/cbg on return

## 2017-09-26 NOTE — ED Provider Notes (Signed)
[facility] EMERGENCY DEPARTMENT Provider Note   CSN: [id number] Arrival date & time: 09/26/17  1427     History   Chief Complaint Chief Complaint  Patient presents with  . Altered Mental Status    HPI Sonya [name] is a 67 y.o. female.  Patient brought in by EMS.  Patient is a known renal dialysis patient.  Normally dialyzed Tuesday Thursdays and Saturdays.  Has not missed dialysis.  Patient noted by neighbors to be confused yesterday.  Her close friend called her this morning noticed that she did not seem to be making sense although her words were normal.  She went to visit her and noted that she definitely was very confused.  EMS was called blood sugar was 45.  Patient given 1 mg of glucagon blood blood sugar up to 88.  Patient still was confused.  Patient fell a week ago and was seen here in the department February 17 for right hip pain.  Patient's family member states that she gets confused like this when her blood sugars are low but confusion did not seem to improve his blood sugar came up.  Patient able to form late normal words but just does not have a logical train of thought.      Past Medical History:  Diagnosis Date  . Anemia   . Anxiety   . CKD (chronic kidney disease)   . Diverticulitis   . Dysphagia   . ESRD (end stage renal disease) ([place])    On HD  . Fracture of femoral neck, left, closed ([place]) 05/30/2015  . Gastritis 06/2013 & 05/2015  . Gastroesophageal reflux disease with stricture 05/2015  . GERD (gastroesophageal reflux disease)   . HTN (hypertension)   . Hyperparathyroidism due to renal insufficiency ([place])   . Hypertensive urgency 06/2013 & 04/2015  . Hypothyroidism   . ICH (intracerebral hemorrhage) ([place]) 10/2014  . 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 Protein calorie malnutrition ([place])   . Stroke ([place])   . Uterine cancer ([place]) 2012  . Vertical diplopia   . Vertigo     Patient Active Problem List   Diagnosis Date Noted  . Altered mental status 09/26/2017    . Anxiety state 02/03/2017  . Malnutrition of moderate degree 01/19/2017  . Hypoglycemia 01/18/2017  . 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 ESRD (end stage renal disease) ([place])   . Abnormal weight loss 12/01/2015  . Pulmonary edema 08/19/2015  . CAP (community acquired pneumonia) 08/19/2015  . Hypothyroidism, adult 06/14/2015  . HA (headache) 06/13/2015  . HTN (hypertension), malignant 06/13/2015  . Elective surgery   . Fracture of femoral neck, left, closed ([place]) 05/30/2015  . Hip fracture ([place]) 05/30/2015  . Closed fracture of neck of left femur ([place])   . UTI (lower urinary tract infection) 04/14/2015  . Orthostatic hypotension 11/27/2014  . Acute encephalopathy   . Confusion   . Protein-calorie malnutrition, severe ([place]) 10/15/2014  . Dysphagia 10/15/2014  . Intracerebral bleed ([place]) 10/14/2014  . End stage renal disease ([place]) 08/31/2013  . Symptomatic anemia 08/18/2013  . Hyperparathyroidism due to renal insufficiency ([place]) 07/09/2013  . Constipation 06/21/2013  . Gastritis 06/21/2013  . Chronic kidney disease 06/20/2013  . Anemia in chronic kidney disease 06/08/2013  . Gout 06/08/2013  . Other dysphagia 06/07/2013  . Colon cancer screening 04/08/2011    Past Surgical History:  Procedure Laterality Date  . ABDOMINAL HYSTERECTOMY    . APPENDECTOMY    . BASCILIC VEIN TRANSPOSITION Right 09/17/2013   Procedure:  BRACHIAL VEIN TRANSPOSITION;  Surgeon: Conrad Belwood, MD;  Location: [place];  Service: Vascular;  Laterality: Right;  . BASCILIC VEIN TRANSPOSITION Right 10/29/2013   Procedure: RIGHT SECOND STAGE BRACHIAL VEIN TRANSPOSITION;  Surgeon: Conrad Seminole, MD;  Location: [place];  Service: Vascular;  Laterality: Right;  . CESAREAN SECTION    . CHOLECYSTECTOMY    . COLONOSCOPY  2000   TICS, IH  . COLONOSCOPY  2003 NUR BRBPR D50 V6   DC/[place] TICS, IH  . COLONOSCOPY N/A 09/04/2013   SLF: Small ulcer in the descending colon, one colon polyp (tubular adenoma), large internal  hemorrhoids, moderate diverticulosis. next tcs 10 years.  . ESOPHAGOGASTRODUODENOSCOPY N/A 05/23/2015   FVO:HKGO non-erosive gastritis/stricture at the gastroesophageal junction  . ESOPHAGOGASTRODUODENOSCOPY (EGD) WITH ESOPHAGEAL DILATION N/A 06/08/2013   Dr. Fields:Stricture at the gastroesophageal junction/MILD NON-erosive gastritis (inflammation) was found in the gastric antrum; multiple biopsies/NO SOURCE FOR ANEMIA IDETIFIED-MOST LIKELY DUE TO ANEMIA OF CHRONIC DISEASE  . FLEXIBLE SIGMOIDOSCOPY N/A 01/02/2016   Dr. Oneida Alar: external hemorrhoids, diverticulosis, internal hemorrhoids, proximal rectum normal  . GIVENS CAPSULE STUDY N/A 12/03/2013   SLF: occasional gastric erosion. small bowel normal  . HIP PINNING,CANNULATED Left 05/31/2015   Procedure: CANNULATED HIP PINNING;  Surgeon: Leandrew Koyanagi, MD;  Location: [place];  Service: Orthopedics;  Laterality: Left;  . INSERTION OF DIALYSIS CATHETER Left 09/17/2013   Procedure: INSERTION OF DIALYSIS CATHETER;  Surgeon: Conrad Gateway, MD;  Location: [place];  Service: Vascular;  Laterality: Left;  . LAPAROSCOPIC TOTAL HYSTERECTOMY    . NASAL SEPTUM SURGERY    . SAVORY DILATION N/A 05/23/2015   Procedure: SAVORY DILATION;  Surgeon: Danie Binder, MD;  [facility];  Service: Endoscopy;  Laterality: N/A;  . THROMBECTOMY AND REVISION OF ARTERIOVENTOUS (AV) GORETEX  GRAFT Right 07/06/2017   Procedure: THROMBECTOMY AND REVISION OF ARTERIOVENOUS (AV) GORETEX  GRAFT RIGHT UPPER ARM;  Surgeon: Rosetta Posner, MD;  Location: [facility];  Service: Vascular;  Laterality: Right;    OB History    Gravida Para Term Preterm AB Living   1 1 1          SAB TAB Ectopic Multiple Live Births                   Home Medications    Prior to Admission medications   Medication Sig Start Date End Date Taking? Authorizing Provider  allopurinol (ZYLOPRIM) 300 MG tablet Take 0.5 tablets (150 mg total) by mouth daily. Dose was decreased secondary to renal  failure. Patient taking differently: Take 300 mg by mouth daily. Dose was decreased secondary to renal failure. 06/16/15  Yes Rexene Alberts, MD  ALPRAZolam Duanne Moron) 0.5 MG tablet Take 1 tablet (0.5 mg total) by mouth 2 (two) times daily. 08/19/17  Yes Fields, Sandi L, MD  atorvastatin (LIPITOR) 10 MG tablet Take 1 tablet by mouth daily. 08/22/17  Yes [provider]  Carboxymethylcellulose Sodium (THERATEARS) 0.25 % SOLN Apply 1-2 drops to eye daily as needed (for eye irritation).   Yes [provider]  cinacalcet (SENSIPAR) 30 MG tablet Take 30 mg by mouth daily.   Yes [provider]  folic acid (FOLVITE) 1 MG tablet Take 2 mg by mouth daily.   Yes [provider]  levothyroxine (SYNTHROID, LEVOTHROID) 50 MCG tablet Take 1 tablet (50 mcg total) by mouth daily. Patient taking differently: Take 50 mcg by mouth daily before breakfast.  04/17/15  Yes Samuella Cota,  MD  lidocaine-prilocaine (EMLA) cream Apply 1 application topically every Tuesday, Thursday, and Saturday at 6 PM.    Yes [provider]  NIFEdipine (PROCARDIA XL/ADALAT-CC) 60 MG 24 hr tablet Take 60 mg by mouth at bedtime.  04/10/15  Yes [provider]  Vitamin D, Ergocalciferol, (DRISDOL) 50000 units CAPS capsule Take 50,000 Units by mouth every 7 (seven) days.   Yes [provider]  pantoprazole (PROTONIX) 40 MG tablet Take 1 tablet (40 mg total) by mouth 2 (two) times daily before a meal. 02/04/17 09/18/17  Mahala Menghini, PA-C    Family History Family History  Problem Relation Age of Onset  . Cancer Mother   . Diabetes Mother   . Hypertension Mother   . Diabetes Father   . Hypertension Father   . Hypertension Sister   . Colon cancer Neg Hx     Social History Social History   Tobacco Use  . Smoking status: Never Smoker  . Smokeless tobacco: Never Used  Substance Use Topics  . Alcohol use: No  . Drug use: No     Allergies   Cephalexin   Review of  Systems Review of Systems  Unable to perform ROS: Mental status change     Physical Exam Updated Vital Signs BP (!) 151/87   Pulse 98   Temp 98.9 F (37.2 C)   Resp 17   Ht 1.626 m (5\' 4" )   Wt 68 kg (150 lb)   SpO2 100%   BMI 25.75 kg/m   Physical Exam  Constitutional: She appears well-developed and well-nourished. No distress.  HENT:  Head: Normocephalic and atraumatic.  Mucous membranes dry.  Eyes: Conjunctivae and EOM are normal. Pupils are equal, round, and reactive to light.  Neck: Neck supple.  Cardiovascular: Normal rate, regular rhythm and normal heart sounds.  Pulmonary/Chest: Effort normal and breath sounds normal. No respiratory distress.  Abdominal: Soft. Bowel sounds are normal. There is no tenderness.  Musculoskeletal: Normal range of motion.  Neurological: She is alert.  Patient alert but confused.  Seems to be moving all extremities.  Difficult to get a specific neuro exam because it is difficult to get her to follow commands.  Patient also seems to have short-term memory deficit as well.  No obvious focal neuro deficit.  Skin: Skin is warm. No rash noted.  Nursing note and vitals reviewed.    ED Treatments / Results  Labs (all labs ordered are listed, but only abnormal results are displayed) Labs Reviewed  COMPREHENSIVE METABOLIC PANEL - Abnormal; Notable for the following components:      Result Value   Chloride 96 (*)    CO2 21 (*)    Glucose, Bld 122 (*)    BUN 50 (*)    Creatinine, Ser 7.20 (*)    Albumin 3.2 (*)    ALT 8 (*)    GFR calc non Af Amer 5 (*)    GFR calc Af Amer 6 (*)    Anion gap 19 (*)    All other components within normal limits  LIPASE, BLOOD - Abnormal; Notable for the following components:   Lipase 52 (*)    All other components within normal limits  CBC WITH DIFFERENTIAL/PLATELET - Abnormal; Notable for the following components:   WBC 11.7 (*)    Hemoglobin 11.8 (*)    MCV 74.5 (*)    MCH 23.6 (*)    RDW 17.7 (*)     Neutro Abs 9.5 (*)  All other components within normal limits  LACTIC ACID, PLASMA - Abnormal; Notable for the following components:   Lactic Acid, Venous 3.1 (*)    All other components within normal limits  LACTIC ACID, PLASMA - Abnormal; Notable for the following components:   Lactic Acid, Venous 2.8 (*)    All other components within normal limits  CULTURE, BLOOD (ROUTINE X 2)  CULTURE, BLOOD (ROUTINE X 2)  ETHANOL  TROPONIN I  RAPID URINE DRUG SCREEN, HOSP PERFORMED  URINALYSIS, ROUTINE W REFLEX MICROSCOPIC  BASIC METABOLIC PANEL  CBC  CBG MONITORING, ED  CBG MONITORING, ED  CBG MONITORING, ED    EKG  EKG Interpretation  Date/Time:  Monday September 26 2017 14:44:42 EST Ventricular Rate:  107 PR Interval:    QRS Duration: 96 QT Interval:  386 QTC Calculation: 488 R Axis:   80 Text Interpretation:  Sinus tachycardia Paired ventricular premature complexes Probable anteroseptal infarct, old Borderline ST depression, inferior leads Confirmed by Fredia Sorrow [phone]) on 09/26/2017 3:34:52 PM       Radiology Ct Head Wo Contrast  Result Date: 09/26/2017 CLINICAL DATA:  Altered mental status, recent fall EXAM: CT HEAD WITHOUT CONTRAST TECHNIQUE: Contiguous axial images were obtained from the base of the skull through the vertex without intravenous contrast. COMPARISON:  01/18/2017 FINDINGS: Brain: Similar age related brain atrophy and extensive chronic white matter microvascular ischemic changes throughout both cerebral hemispheres. No acute intracranial hemorrhage, mass lesion infarction midline shift, herniation, hydrocephalus, or extra-axial fluid collection. No focal mass effect or edema. Cisterns are patent. No cerebellar. Vascular: Intracranial atherosclerosis noted.  Hyperdense vessel. Skull: Intact skull.  Negative for fracture. Sinuses/Orbits: Symmetric orbits. 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 Other: None.  IMPRESSION: Stable head CT without contrast. No acute intracranial abnormality or significant change. 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 Electronically Signed   By: Jerilynn Mages.  Shick M.D.   On: 09/26/2017 16:20   Mr [name] Nam Head Wo Contrast  Result Date: 09/26/2017 CLINICAL DATA:  Altered mental status and weakness for 2 days EXAM: MRI HEAD WITHOUT CONTRAST MRA HEAD WITHOUT CONTRAST TECHNIQUE: Multiplanar, multiecho pulse sequences of the brain and surrounding structures were obtained without intravenous contrast. Angiographic images of the head were obtained using MRA technique without contrast. COMPARISON:  Head CT 09/26/2017 Brain MRI 11/19/2016 FINDINGS: MRI HEAD FINDINGS Brain: The midline structures are normal. Punctate focus of abnormal diffusion restriction within the medial right temporal lobe (series 4, image 68). Multifocal periventricular white matter hyperintensity, most often a result of chronic microvascular ischemia. No mass lesion. No chronic microhemorrhage or cerebral amyloid angiopathy. No hydrocephalus, age advanced atrophy or lobar predominant volume loss. There are scattered foci of chronic microhemorrhage within the brainstem, cerebellum and bilateral basal ganglia. Skull and upper cervical spine: The visualized skull base, calvarium, upper cervical spine and extracranial soft tissues are normal. Sinuses/Orbits: Meningoencephalocele extending into the left eye vision of the sphenoid sinus, unchanged. Normal orbits. MRA HEAD FINDINGS Intracranial internal carotid arteries: Normal. Anterior cerebral arteries: Normal. Middle cerebral arteries: Mild atherosclerotic irregularity. Posterior communicating arteries: Present on the right. Posterior cerebral arteries: Normal. Basilar artery: Moderate narrowing of the basilar artery, worse proximally, unchanged. Vertebral arteries: The right vertebral artery is occluded  proximal to the PICA origin. There is reconstitution of the distal V4 segment. Superior cerebellar arteries: Normal. Anterior inferior cerebellar arteries: Normal. Posterior inferior cerebellar arteries: Visualized on the left only. IMPRESSION: 1. Punctate  focus of acute ischemia within the inferomedial right temporal lobe. No associated hemorrhage. 2. 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 3. Chronic ischemic microangiopathy. 4. Unchanged left sphenoid meningoencephalocele. Electronically Signed   By: Ulyses Jarred M.D.   On: 09/26/2017 20:28   Mr Brain Wo Contrast (neuro Protocol)  Result Date: 09/26/2017 CLINICAL DATA:  Altered mental status and weakness for 2 days EXAM: MRI HEAD WITHOUT CONTRAST MRA HEAD WITHOUT CONTRAST TECHNIQUE: Multiplanar, multiecho pulse sequences of the brain and surrounding structures were obtained without intravenous contrast. Angiographic images of the head were obtained using MRA technique without contrast. COMPARISON:  Head CT 09/26/2017 Brain MRI 11/19/2016 FINDINGS: MRI HEAD FINDINGS Brain: The midline structures are normal. Punctate focus of abnormal diffusion restriction within the medial right temporal lobe (series 4, image 68). Multifocal periventricular white matter hyperintensity, most often a result of chronic microvascular ischemia. No mass lesion. No chronic microhemorrhage or cerebral amyloid angiopathy. No hydrocephalus, age advanced atrophy or lobar predominant volume loss. There are scattered foci of chronic microhemorrhage within the brainstem, cerebellum and bilateral basal ganglia. Skull and upper cervical spine: The visualized skull base, calvarium, upper cervical spine and extracranial soft tissues are normal.  Sinuses/Orbits: Meningoencephalocele extending into the left eye vision of the sphenoid sinus, unchanged. Normal orbits. MRA HEAD FINDINGS Intracranial internal carotid arteries: Normal. Anterior cerebral arteries: Normal. Middle cerebral arteries: Mild atherosclerotic irregularity. Posterior communicating arteries: Present on the right. Posterior cerebral arteries: Normal. Basilar artery: Moderate narrowing of the basilar artery, worse proximally, unchanged. Vertebral arteries: The right vertebral artery is occluded proximal to the PICA origin. There is reconstitution of the distal V4 segment. Superior cerebellar arteries: Normal. Anterior inferior cerebellar arteries: Normal. Posterior inferior cerebellar arteries: Visualized on the left only. IMPRESSION: 1. Punctate focus of acute ischemia within the inferomedial right temporal lobe. No associated hemorrhage. 2. 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 3. Chronic ischemic microangiopathy. 4. Unchanged left sphenoid meningoencephalocele. Electronically Signed   By: Ulyses Jarred M.D.   On: 09/26/2017 20:28   Dg Chest Port 1 View  Result Date: 09/26/2017 CLINICAL DATA:  Altered mental status EXAM: PORTABLE CHEST 1 VIEW COMPARISON:  01/18/2017, CT chest 01/18/2017 FINDINGS: Patchy atelectasis or minimal infiltrate at the right base. No pleural effusion or consolidation. Stable cardiomediastinal silhouette with aortic atherosclerosis. No pneumothorax. IMPRESSION: Minimal patchy opacity at the right base, favor atelectasis. Otherwise no radiographic evidence for acute cardiopulmonary abnormality. Electronically Signed   By: Donavan Foil M.D.   On: 09/26/2017 15:08    Procedures Procedures (including critical  care time)  Medications Ordered in ED Medications  enoxaparin (LOVENOX) injection 30 mg (not administered)  ondansetron (ZOFRAN) tablet 4 mg (not administered)    Or  ondansetron (ZOFRAN) injection 4 mg (not administered)  dextrose 10 % infusion (not administered)  sodium chloride 0.9 % bolus 500 mL (0 mLs Intravenous Stopped 09/26/17 1836)     Initial Impression / Assessment and Plan / ED Course  I have reviewed the triage vital signs and the nursing notes.  Pertinent labs &  imaging results that were available during my care of the patient were reviewed by me and considered in my medical decision making (see chart for details).    Upon arrival here patient's blood sugar was 80.  Clearly seem to have confusion but did not have a depressed mental status and no obvious neuro focal deficit.  Workup was done with some concerns for perhaps infection as the cause.  Patient's lactic acid was elevated but her vital signs other than an initial low-grade tachycardia without any hypotension no true fever.  Chest x-ray negative for pneumonia head CT without acute findings.  MRI was done because of the confusion which showed may be artifact or an acute temporal area punctate infarct but this would not explain her symptoms.  Discussed with neuro hospitalist at [facility] he felt that it was probably artifact.  Patient requires admission for the confusion and for the blood sugars.  Her blood sugars started to drift back down to 72.  Patient will be fed and hospitalist will provide her with some glucose.  When her blood sugars were up in the 80s there was no significant improvement in her confusion however family member does state that in general it is better than it was but she is not back to baseline.   Patient is a dialysis patient.  Potassium here today is normal.  No impending reason for dialysis but tomorrow would be her dialysis day.  Chest x-ray without evidence of any fluid over load.  Patient did appear  to be dehydrated she was given a 500 cc bolus of fluid and a maintenance rate.  While here patient's blood sugar never dropped down below 70.  Hospitalist will admit.  Final Clinical Impressions(s) / ED Diagnoses   Final diagnoses:  Confusion  End stage renal failure on dialysis [facility])  Hypoglycemia    ED Discharge Orders    None       Fredia Sorrow, MD 09/26/17 2211

## 2017-09-26 NOTE — ED Notes (Signed)
Ambulated pt to w/c and took her to the restroom because she said she felt like she had to urinate. She did not. Placed back in bed, comfort measures, updated pt/family on status at this time.

## 2017-09-26 NOTE — ED Triage Notes (Signed)
Pt went to her PCP today. Her friend took her to a PCP and found she was AMS. Was supposed to go to dialysis today. CBG was 45. Was given 1mg  Glucagon and was brought up to 88. Pt still confused and is only alerted to self. Pt fell a week ago and was here on the 17th. Pt is not usually altered per EMS. Pt's words are slightly slurred per EMS  Hr 109  BP 148/70

## 2017-09-26 NOTE — H&P (Signed)
History and Physical    Sonya [name] XKG:[id number] DOB: 1950-12-25 DOA: 09/26/2017  PCP: Redmond School, MD   Patient coming from: Home.  I have personally briefly reviewed patient's old medical records in May  Chief Complaint: Altered mental status.  HPI: Sonya [name] is a 67 y.o. female with medical history significant of anemia, anxiety, ESRD on hemodialysis Tuesday/Thursday/Saturday, diverticulosis/diverticulitis, history of dysphagia, history of fracture of left femoral neck, GERD, hypertension/history of hypertensive urgency, hyperparathyroidism due to renal insufficiency, hypothyroidism, history of intracerebral hemorrhage, meningocele, protein calorie malnutrition, stroke, history of uterine cancer, history of vertical diplopia, vertigo, history of previous hypoglycemic episodes who is brought to the emergency department via EMS after her friend noticed that she was having AMS while she was taking her to her PCP today.  EMS was called.  Her CBG was 45 mg/dL and she was given 1 mg of glucagon IM which apparently increased her glucose to 88 mg/dL.  Per EMS, she was seen here about a week ago due to a fall.She is normally not altered like this and they also noticed her words were slightly slurred.  When seen, the patient is unable to elaborate on her symptoms, but denies acute pain or acute distress.  ED Course: Initial vital signs were temperature 99 F, pulse 105, respirations 17, blood pressure 131/101 mmHg and sat 96% on nasal cannula oxygen.  Her workup shows a white count of 11.7 with 81% neutrophils, 12% lymphocytes and 7% monocytes.  Hemoglobin was 11.8 g/dL and platelets 253.  Initial lactic acid was 3.1 and follow-up lactic acid was 2.8 mmol/L after 500 mL of normal saline bolus were given.  Lipase was 52 units/L.  CMP shows a sodium of 136, potassium 3.6, chloride 96, CO2 21 mmol/L.  Her glucose 122, BUN 50, creatinine 7.2 and calcium 9.2 mg/dL.  Her LFTs  are unremarkable except for an albumin of 3.2 g/dL.  Troponin was less than 0.03 ng/mL.  Imaging: Chest radiograph showed minimal patchy opacity of the right base which is favored to be atelectasis.  Otherwise no radiographic evidence for acute cardiopulmonary disease.    CT head without contrast showed stable atrophy, chronic white matter microvascular changes and stable chronic meningocele into the left sphenoid sinus.    MR/MRA brain shows Punctate focus of acute ischemia within the inferomedial right temporal lobe. No associated hemorrhage. 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 Chronic ischemic microangiopathy. Unchanged left sphenoid meningoencephalocele. Please see images and full radiology reports for further detail.  Review of Systems: Unable to fully obtain..    Past Medical History:  Diagnosis Date  . Anemia   . Anxiety   . CKD (chronic kidney disease)   . Diverticulitis   . Dysphagia   . ESRD (end stage renal disease) ([place])    On HD  . Fracture of femoral neck, left, closed ([place]) 05/30/2015  . Gastritis 06/2013 & 05/2015  . Gastroesophageal reflux disease with stricture 05/2015  . GERD (gastroesophageal reflux disease)   . HTN (hypertension)   . Hyperparathyroidism due to renal insufficiency ([place])   . Hypertensive urgency 06/2013 & 04/2015  . Hypothyroidism   . ICH (intracerebral hemorrhage) ([place]) 10/2014  . Meningocele ([place])   .  Protein calorie malnutrition ([place])   . Stroke ([place])   . Uterine cancer ([place]) 2012  . Vertical diplopia   . Vertigo     Past Surgical History:  Procedure Laterality Date  . ABDOMINAL HYSTERECTOMY    . APPENDECTOMY    . BASCILIC VEIN TRANSPOSITION Right 09/17/2013   Procedure: BRACHIAL VEIN  TRANSPOSITION;  Surgeon: Conrad New Douglas, MD;  Location: [facility];  Service: Vascular;  Laterality: Right;  . BASCILIC VEIN TRANSPOSITION Right 10/29/2013   Procedure: RIGHT SECOND STAGE BRACHIAL VEIN TRANSPOSITION;  Surgeon: Conrad Garrison, MD;  Location: [place] [facility];  Service: Vascular;  Laterality: Right;  . CESAREAN SECTION    . CHOLECYSTECTOMY    . COLONOSCOPY  2000   TICS, IH  . COLONOSCOPY  2003 NUR BRBPR D50 V6   DC/Liborio Negron Torres TICS, IH  . COLONOSCOPY N/A 09/04/2013   SLF: Small ulcer in the descending colon, one colon polyp (tubular adenoma), large internal hemorrhoids, moderate diverticulosis. next tcs 10 years.  . ESOPHAGOGASTRODUODENOSCOPY N/A 05/23/2015   BSJ:GGEZ non-erosive gastritis/stricture at the gastroesophageal junction  . ESOPHAGOGASTRODUODENOSCOPY (EGD) WITH ESOPHAGEAL DILATION N/A 06/08/2013   Dr. Fields:Stricture at the gastroesophageal junction/MILD NON-erosive gastritis (inflammation) was found in the gastric antrum; multiple biopsies/NO SOURCE FOR ANEMIA IDETIFIED-MOST LIKELY DUE TO ANEMIA OF CHRONIC DISEASE  . FLEXIBLE SIGMOIDOSCOPY N/A 01/02/2016   Dr. Oneida Alar: external hemorrhoids, diverticulosis, internal hemorrhoids, proximal rectum normal  . GIVENS CAPSULE STUDY N/A 12/03/2013   SLF: occasional gastric erosion. small bowel normal  . HIP PINNING,CANNULATED Left 05/31/2015   Procedure: CANNULATED HIP PINNING;  Surgeon: Leandrew Koyanagi, MD;  Location: [facility];  Service: Orthopedics;  Laterality: Left;  . INSERTION OF DIALYSIS CATHETER Left 09/17/2013   Procedure: INSERTION OF DIALYSIS CATHETER;  Surgeon: Conrad Athens, MD;  Location: [facility];  Service: Vascular;  Laterality: Left;  . LAPAROSCOPIC TOTAL HYSTERECTOMY    . NASAL SEPTUM SURGERY    . SAVORY DILATION N/A 05/23/2015   Procedure: SAVORY DILATION;  Surgeon: Danie Binder, MD;  [facility];  Service: Endoscopy;  Laterality: N/A;  . THROMBECTOMY AND REVISION OF ARTERIOVENTOUS (AV) GORETEX  GRAFT Right 07/06/2017   Procedure:  THROMBECTOMY AND REVISION OF ARTERIOVENOUS (AV) GORETEX  GRAFT RIGHT UPPER ARM;  Surgeon: Rosetta Posner, MD;  Location: [facility];  Service: Vascular;  Laterality: Right;     reports that  has never smoked. she has never used smokeless tobacco. She reports that she does not drink alcohol or use drugs.  Allergies  Allergen Reactions  . Cephalexin Itching    Family History  Problem Relation Age of Onset  . Cancer Mother   . Diabetes Mother   . Hypertension Mother   . Diabetes Father   . Hypertension Father   . Hypertension Sister   . Colon cancer Neg Hx     Prior to Admission medications   Medication Sig Start Date End Date Taking? Authorizing Provider  allopurinol (ZYLOPRIM) 300 MG tablet Take 0.5 tablets (150 mg total) by mouth daily. Dose was decreased secondary to renal failure. Patient taking differently: Take 300 mg by mouth daily. Dose was decreased secondary to renal failure. 06/16/15  Yes Rexene Alberts, MD  ALPRAZolam Duanne Moron) 0.5 MG tablet Take 1 tablet (0.5 mg total) by mouth 2 (two) times daily. 08/19/17  Yes Fields, Sandi L, MD  atorvastatin (LIPITOR) 10 MG tablet Take 1 tablet by mouth daily. 08/22/17  Yes [provider]  Carboxymethylcellulose Sodium (THERATEARS) 0.25 % SOLN Apply  1-2 drops to eye daily as needed (for eye irritation).   Yes [provider]  cinacalcet (SENSIPAR) 30 MG tablet Take 30 mg by mouth daily.   Yes [provider]  folic acid (FOLVITE) 1 MG tablet Take 2 mg by mouth daily.   Yes [provider]  levothyroxine (SYNTHROID, LEVOTHROID) 50 MCG tablet Take 1 tablet (50 mcg total) by mouth daily. Patient taking differently: Take 50 mcg by mouth daily before breakfast.  04/17/15  Yes Samuella Cota, MD  lidocaine-prilocaine (EMLA) cream Apply 1 application topically every Tuesday, Thursday, and Saturday at 6 PM.    Yes [provider]  NIFEdipine (PROCARDIA XL/ADALAT-CC) 60 MG 24 hr tablet Take 60 mg by mouth  at bedtime.  04/10/15  Yes [provider]  Vitamin D, Ergocalciferol, (DRISDOL) 50000 units CAPS capsule Take 50,000 Units by mouth every 7 (seven) days.   Yes [provider]  pantoprazole (PROTONIX) 40 MG tablet Take 1 tablet (40 mg total) by mouth 2 (two) times daily before a meal. 02/04/17 09/18/17  Mahala Menghini, PA-C    Physical Exam: Vitals:   09/26/17 2030 09/26/17 2119 09/26/17 2152 09/26/17 2200  BP: (!) 141/76  (!) 151/87 (!) 150/78  Pulse: (!) 106  98 (!) 102  Resp: 20  17 20   Temp:  98.9 F (37.2 C)    TempSrc:      SpO2: 100%  100% 99%  Weight:      Height:        Constitutional: NAD, calm, comfortable Eyes: PERRL, lids and conjunctivae normal ENMT: Mucous membranes are moist. Posterior pharynx clear of any exudate or lesions. Dentures present. Neck: normal, supple, no masses, no thyromegaly Respiratory: Decreased breath sounds on bases, otherwise clear to auscultation bilaterally, no wheezing, no crackles. Normal respiratory effort. No accessory muscle use.  Cardiovascular: Regular rate and rhythm, 2/6 midsystolic murmur, no rubs / gallops. No extremity edema.  RUE AV fistula.  2+ pedal pulses. No carotid bruits.  Abdomen: Soft, no tenderness, no masses palpated. No hepatosplenomegaly. Bowel sounds positive.  Musculoskeletal: no clubbing / cyanosis. Good ROM, no contractures. Normal muscle tone.  Skin: no rashes, lesions, ulcers on limited dermatological examination. Neurologic: CN 2-12 grossly intact.  Grossly nonfocal.  Unable to fully evaluate. Psychiatric: Alert and oriented x 2, oriented to place with some difficulty, disoriented to time, date and situation.  She asks the same questions multiple times.    Labs on Admission: I have personally reviewed following labs and imaging studies  CBC: Recent Labs  Lab 09/26/17 1547  WBC 11.7*  NEUTROABS 9.5*  HGB 11.8*  HCT 37.2  MCV 74.5*  PLT 269   Basic Metabolic Panel: Recent Labs  Lab  09/26/17 1634  NA 136  K 3.6  CL 96*  CO2 21*  GLUCOSE 122*  BUN 50*  CREATININE 7.20*  CALCIUM 9.2   GFR: Estimated Creatinine Clearance: 7.1 mL/min (A) (by C-G formula based on SCr of 7.2 mg/dL (H)). Liver Function Tests: Recent Labs  Lab 09/26/17 1634  AST 23  ALT 8*  ALKPHOS 76  BILITOT 0.6  PROT 8.1  ALBUMIN 3.2*   Recent Labs  Lab 09/26/17 1634  LIPASE 52*   No results for input(s): AMMONIA in the last 168 hours. Coagulation Profile: No results for input(s): INR, PROTIME in the last 168 hours. Cardiac Enzymes: Recent Labs  Lab 09/26/17 1634  TROPONINI <0.03   BNP (last 3 results) No results for input(s): PROBNP in  the last 8760 hours. HbA1C: No results for input(s): HGBA1C in the last 72 hours. CBG: Recent Labs  Lab 09/26/17 1432 09/26/17 2057 09/26/17 2149  GLUCAP 82 73 92   Lipid Profile: No results for input(s): CHOL, HDL, LDLCALC, TRIG, CHOLHDL, LDLDIRECT in the last 72 hours. Thyroid Function Tests: No results for input(s): TSH, T4TOTAL, FREET4, T3FREE, THYROIDAB in the last 72 hours. Anemia Panel: No results for input(s): VITAMINB12, FOLATE, FERRITIN, TIBC, IRON, RETICCTPCT in the last 72 hours. Urine analysis:    Component Value Date/Time   COLORURINE YELLOW 04/14/2015 1427   APPEARANCEUR CLEAR 04/14/2015 1427   LABSPEC 1.020 04/14/2015 1427   PHURINE 8.5 (H) 04/14/2015 1427   GLUCOSEU 100 (A) 04/14/2015 1427   HGBUR SMALL (A) 04/14/2015 1427   BILIRUBINUR NEGATIVE 04/14/2015 1427   KETONESUR NEGATIVE 04/14/2015 1427   PROTEINUR >300 (A) 04/14/2015 1427   UROBILINOGEN 0.2 04/14/2015 1427   NITRITE NEGATIVE 04/14/2015 1427   LEUKOCYTESUR TRACE (A) 04/14/2015 1427    Radiological Exams on Admission: Ct Head Wo Contrast  Result Date: 09/26/2017 CLINICAL DATA:  Altered mental status, recent fall EXAM: CT HEAD WITHOUT CONTRAST TECHNIQUE: Contiguous axial images were obtained from the base of the skull through the vertex without  intravenous contrast. COMPARISON:  01/18/2017 FINDINGS: Brain: Similar age related brain atrophy and extensive chronic white matter microvascular ischemic changes throughout both cerebral hemispheres. No acute intracranial hemorrhage, mass lesion infarction midline shift, herniation, hydrocephalus, or extra-axial fluid collection. No focal mass effect or edema. Cisterns are patent. No cerebellar. Vascular: Intracranial atherosclerosis noted.  Hyperdense vessel. Skull: Intact skull.  Negative for fracture. Sinuses/Orbits: Symmetric orbits. 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 Other: None. IMPRESSION: Stable head CT without contrast. No acute intracranial abnormality or significant change. 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 Electronically Signed   By: Jerilynn Mages.  Shick M.D.   On: 09/26/2017 16:20   Mr [name] Nam Head Wo Contrast  Result Date: 09/26/2017 CLINICAL DATA:  Altered mental status and weakness for 2 days EXAM: MRI HEAD WITHOUT CONTRAST MRA HEAD WITHOUT CONTRAST TECHNIQUE: Multiplanar, multiecho pulse sequences of the brain and surrounding structures were obtained without intravenous contrast. Angiographic images of the head were obtained using MRA technique without contrast. COMPARISON:  Head CT 09/26/2017 Brain MRI 11/19/2016 FINDINGS: MRI HEAD FINDINGS Brain: The midline structures are normal. Punctate focus of abnormal diffusion restriction within the medial right temporal lobe (series 4, image 68). Multifocal periventricular white matter hyperintensity, most often a result of chronic microvascular ischemia. No mass lesion. No chronic microhemorrhage or cerebral amyloid angiopathy. No hydrocephalus, age advanced atrophy or lobar predominant volume loss. There are scattered foci of chronic microhemorrhage within the brainstem, cerebellum and bilateral basal ganglia. Skull and upper  cervical spine: The visualized skull base, calvarium, upper cervical spine and extracranial soft tissues are normal. Sinuses/Orbits: Meningoencephalocele extending into the left eye vision of the sphenoid sinus, unchanged. Normal orbits. MRA HEAD FINDINGS Intracranial internal carotid arteries: Normal. Anterior cerebral arteries: Normal. Middle cerebral arteries: Mild atherosclerotic irregularity. Posterior communicating arteries: Present on the right. Posterior cerebral arteries: Normal. Basilar artery: Moderate narrowing of the basilar artery, worse proximally, unchanged. Vertebral arteries: The right vertebral artery is occluded proximal to the PICA origin. There is reconstitution of the distal V4 segment. Superior cerebellar arteries: Normal. Anterior inferior cerebellar arteries: Normal. Posterior inferior cerebellar arteries: Visualized on the left only. IMPRESSION: 1. Punctate focus of acute ischemia within the  inferomedial right temporal lobe. No associated hemorrhage. 2. 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 3. Chronic ischemic microangiopathy. 4. Unchanged left sphenoid meningoencephalocele. Electronically Signed   By: Ulyses Jarred M.D.   On: 09/26/2017 20:28   Mr Brain Wo Contrast (neuro Protocol)  Result Date: 09/26/2017 CLINICAL DATA:  Altered mental status and weakness for 2 days EXAM: MRI HEAD WITHOUT CONTRAST MRA HEAD WITHOUT CONTRAST TECHNIQUE: Multiplanar, multiecho pulse sequences of the brain and surrounding structures were obtained without intravenous contrast. Angiographic images of the head were obtained using MRA technique without contrast. COMPARISON:  Head CT 09/26/2017 Brain MRI 11/19/2016 FINDINGS: MRI HEAD FINDINGS Brain: The  midline structures are normal. Punctate focus of abnormal diffusion restriction within the medial right temporal lobe (series 4, image 68). Multifocal periventricular white matter hyperintensity, most often a result of chronic microvascular ischemia. No mass lesion. No chronic microhemorrhage or cerebral amyloid angiopathy. No hydrocephalus, age advanced atrophy or lobar predominant volume loss. There are scattered foci of chronic microhemorrhage within the brainstem, cerebellum and bilateral basal ganglia. Skull and upper cervical spine: The visualized skull base, calvarium, upper cervical spine and extracranial soft tissues are normal. Sinuses/Orbits: Meningoencephalocele extending into the left eye vision of the sphenoid sinus, unchanged. Normal orbits. MRA HEAD FINDINGS Intracranial internal carotid arteries: Normal. Anterior cerebral arteries: Normal. Middle cerebral arteries: Mild atherosclerotic irregularity. Posterior communicating arteries: Present on the right. Posterior cerebral arteries: Normal. Basilar artery: Moderate narrowing of the basilar artery, worse proximally, unchanged. Vertebral arteries: The right vertebral artery is occluded proximal to the PICA origin. There is reconstitution of the distal V4 segment. Superior cerebellar arteries: Normal. Anterior inferior cerebellar arteries: Normal. Posterior inferior cerebellar arteries: Visualized on the left only. IMPRESSION: 1. Punctate focus of acute ischemia within the inferomedial right temporal lobe. No associated hemorrhage. 2. 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 3. Chronic ischemic microangiopathy. 4. Unchanged left sphenoid meningoencephalocele. Electronically Signed   By: Ulyses Jarred M.D.    On: 09/26/2017 20:28   Dg Chest Port 1 View  Result Date: 09/26/2017 CLINICAL DATA:  Altered mental status EXAM: PORTABLE CHEST 1 VIEW COMPARISON:  01/18/2017, CT chest 01/18/2017 FINDINGS: Patchy atelectasis or minimal infiltrate at the right base. No pleural effusion or consolidation. Stable cardiomediastinal silhouette with aortic atherosclerosis. No pneumothorax. IMPRESSION: Minimal patchy opacity at the right base, favor atelectasis. Otherwise no radiographic evidence for acute cardiopulmonary abnormality. Electronically Signed   By: Donavan Foil M.D.   On: 09/26/2017 15:08   10/20/2014 EEG adult  Interpreting Physician: Hulen Luster  History: Sonya [name] is an 67 y.o. female s/p L BG ICH admitted with altered mental status  Medications:  Scheduled: . allopurinol  300 mg Oral Daily  . docusate sodium  100 mg Oral BID  . hydrALAZINE  50 mg Oral 3 times per day  . labetalol  300 mg Oral BID  . levothyroxine  50 mcg Oral QAC breakfast  . Linaclotide  290 mcg Oral q morning - 10a  . pantoprazole  40 mg Oral Daily  .  sevelamer carbonate  2,400 mg Oral TID WC  . sodium bicarbonate  650 mg Oral BID    Conditions of Recording:  This is a 16 channel EEG carried out with the patient in the drowsy state.  Description:  The waking background activity consists of a low voltage, symmetrical,poorly organized theta activity predominantly in the 6 to 7 Hz range. There are brief periods of poorly sustained alpha activity in the posterior region. Normal sleep architecture is not observed.  There are periods of intermittent left temporal delta activity. In addition there are a few noted left temporal sharp waves phase reversing at T3.   Photic stimulation and hyperventilation were not performed.   IMPRESSION: Abnormal EEG due to generalized slowing indicating a mild to moderate cerebral disturbance (encephalopathy). In addition there is intermittent focal slowing and sharp  wave activity in the left temporal region indicating a potential focal abnormality.    Jim Like, DO [facility] [phone]   08/22/2015 echo complete  LV EF: 60% -   65%  ------------------------------------------------------------------- Indications:      Dyspnea 786.09.  ------------------------------------------------------------------- Study Conclusions  - Left ventricle: The cavity size was normal. Wall thickness was   increased in a pattern of severe LVH. Systolic function was   normal. The estimated ejection fraction was in the range of 60%   to 65%. Wall motion was normal; there were no regional wall   motion abnormalities. Features are consistent with a pseudonormal   left ventricular filling pattern, with concomitant abnormal   relaxation and increased filling pressure (grade 2 diastolic   dysfunction). Doppler parameters are consistent with high   ventricular filling pressure. - Aortic valve: Valve area (VTI): 2.18 cm^2. Valve area (Vmax):   2.41 cm^2. - Mitral valve: There was mild regurgitation. - Left atrium: The atrium was severely dilated. - Right atrium: The atrium was moderately to severely dilated. - Atrial septum: No defect or patent foramen ovale was identified. - Tricuspid valve: There was mild-moderate regurgitation. - Pulmonary arteries: Systolic pressure was moderately increased.   PA peak pressure: 42 mm Hg (S). - Inferior vena cava: The vessel was dilated. The respirophasic   diameter changes were blunted (< 50%), consistent with elevated   central venous pressure. - Pericardium, extracardiac: There is a large left pleural effusion - Technically adequate study.   EKG: Independently reviewed. Vent. rate 107 BPM PR interval * ms QRS duration 96 ms QT/QTc 386/488 ms P-R-T axes 78 80 69 Sinus tachycardia Paired ventricular premature complexes Probable anteroseptal infarct, old Borderline ST depression, inferior  leads  Assessment/Plan Principal Problem:   Altered mental status   Confusion Likely due to worsening cerebrovascular disease in the presence of hypoglycemia. Dr. Rogene Houston discuss MR/MRA findings with neurologist at [facility], who personally reviewed films. Dr. Horatio Pel did not feel that her symptoms are related to her questionable left inferotemporal finding. This could even be artifact.  However, given her new cerebrovascular findings, will get carotid Doppler in a.m. Check TSH and B12 level. Continue neurochecks and monitor for hypoglycemia.  Active Problems:   Hypoglycemia   Her daughter stated earlier, that she has had similar symptoms for hypoglycemic. Will encourage fluid oral intake. Continue dextrose 10% at 25 mL/hr. Monitor CBG.    Lactic acidosis No fever or obvious source of infection. The patient has not been hypoxic. Received limited fluids earlier due to ESRD. Currently is trending down, so will just continue to observe. Check lactic acid with morning labs.    End stage renal  disease [facility]) [place] [facility] for hemodialysis orders.    Anemia in chronic kidney disease Monitor hematocrit and hemoglobin. Erythropoietin per nephrology.    Gout Continue allopurinol.    Hyperparathyroidism due to renal insufficiency (HCC) Continue Sensipar 30 mg p.o. daily. Continue vitamin D supplementation.    Essential hypertension Continue extended release nifedipine 60 mg p.o. daily. Monitor blood pressure.    Hyperlipidemia Continue atorvastatin 10 mg p.o. daily. Monitor LFTs as needed. Fasting lipid panel follow-up per primary physician.    DVT prophylaxis: SCDs. Code Status: Full code. Family Communication:  Disposition Plan: Observation for 24-48 hours for altered mental status and hypoglycemia Consults called: Routine nephrology consult. Admission status: Observation/telemetry.   Reubin Milan MD [facility] Pager [phone].  If 7PM-7AM,  please contact night-coverage [email or web address] Password [email or web address]  09/26/2017, 10:50 PM

## 2017-09-26 NOTE — ED Notes (Signed)
MD at bedside. 

## 2017-09-27 ENCOUNTER — Encounter (HOSPITAL_COMMUNITY): Payer: Self-pay | Admitting: *Deleted

## 2017-09-27 ENCOUNTER — Other Ambulatory Visit: Payer: Self-pay

## 2017-09-27 ENCOUNTER — Observation Stay (HOSPITAL_COMMUNITY): Payer: Medicare Other

## 2017-09-27 DIAGNOSIS — R410 Disorientation, unspecified: Secondary | ICD-10-CM

## 2017-09-27 DIAGNOSIS — N186 End stage renal disease: Secondary | ICD-10-CM | POA: Diagnosis not present

## 2017-09-27 DIAGNOSIS — D631 Anemia in chronic kidney disease: Secondary | ICD-10-CM | POA: Diagnosis not present

## 2017-09-27 DIAGNOSIS — E162 Hypoglycemia, unspecified: Secondary | ICD-10-CM | POA: Diagnosis not present

## 2017-09-27 DIAGNOSIS — N2581 Secondary hyperparathyroidism of renal origin: Secondary | ICD-10-CM | POA: Diagnosis not present

## 2017-09-27 DIAGNOSIS — I639 Cerebral infarction, unspecified: Secondary | ICD-10-CM | POA: Diagnosis not present

## 2017-09-27 DIAGNOSIS — M1A30X0 Chronic gout due to renal impairment, unspecified site, without tophus (tophi): Secondary | ICD-10-CM | POA: Diagnosis not present

## 2017-09-27 DIAGNOSIS — R4182 Altered mental status, unspecified: Secondary | ICD-10-CM | POA: Diagnosis not present

## 2017-09-27 DIAGNOSIS — Z992 Dependence on renal dialysis: Secondary | ICD-10-CM | POA: Diagnosis not present

## 2017-09-27 DIAGNOSIS — E785 Hyperlipidemia, unspecified: Secondary | ICD-10-CM | POA: Diagnosis present

## 2017-09-27 DIAGNOSIS — E872 Acidosis: Secondary | ICD-10-CM | POA: Diagnosis not present

## 2017-09-27 DIAGNOSIS — I6523 Occlusion and stenosis of bilateral carotid arteries: Secondary | ICD-10-CM | POA: Diagnosis not present

## 2017-09-27 DIAGNOSIS — I10 Essential (primary) hypertension: Secondary | ICD-10-CM | POA: Diagnosis not present

## 2017-09-27 DIAGNOSIS — I613 Nontraumatic intracerebral hemorrhage in brain stem: Secondary | ICD-10-CM | POA: Diagnosis not present

## 2017-09-27 LAB — CBC
HCT: 32.4 % — ABNORMAL LOW (ref 36.0–46.0)
HEMATOCRIT: 32 % — AB (ref 36.0–46.0)
HEMOGLOBIN: 10.6 g/dL — AB (ref 12.0–15.0)
Hemoglobin: 10.5 g/dL — ABNORMAL LOW (ref 12.0–15.0)
MCH: 24.1 pg — ABNORMAL LOW (ref 26.0–34.0)
MCH: 23.5 pg — ABNORMAL LOW (ref 26.0–34.0)
MCHC: 33.1 g/dL (ref 30.0–36.0)
MCHC: 32.4 g/dL (ref 30.0–36.0)
MCV: 72.9 fL — AB (ref 78.0–100.0)
MCV: 72.5 fL — AB (ref 78.0–100.0)
PLATELETS: 237 10*3/uL (ref 150–400)
Platelets: 214 10*3/uL (ref 150–400)
RBC: 4.39 MIL/uL (ref 3.87–5.11)
RBC: 4.47 MIL/uL (ref 3.87–5.11)
RDW: 17.4 % — ABNORMAL HIGH (ref 11.5–15.5)
RDW: 17.5 % — ABNORMAL HIGH (ref 11.5–15.5)
WBC: 5.5 10*3/uL (ref 4.0–10.5)
WBC: 5 10*3/uL (ref 4.0–10.5)

## 2017-09-27 LAB — RENAL FUNCTION PANEL
Albumin: 2.9 g/dL — ABNORMAL LOW (ref 3.5–5.0)
Anion gap: 15 (ref 5–15)
BUN: 54 mg/dL — ABNORMAL HIGH (ref 6–20)
CHLORIDE: 97 mmol/L — AB (ref 101–111)
CO2: 23 mmol/L (ref 22–32)
Calcium: 8.6 mg/dL — ABNORMAL LOW (ref 8.9–10.3)
Creatinine, Ser: 7.97 mg/dL — ABNORMAL HIGH (ref 0.44–1.00)
GFR, EST AFRICAN AMERICAN: 5 mL/min — AB (ref >60)
GFR, EST NON AFRICAN AMERICAN: 5 mL/min — AB (ref >60)
Glucose, Bld: 139 mg/dL — ABNORMAL HIGH (ref 65–99)
POTASSIUM: 3.7 mmol/L (ref 3.5–5.1)
Phosphorus: 2.1 mg/dL — ABNORMAL LOW (ref 2.5–4.6)
Sodium: 135 mmol/L (ref 135–145)

## 2017-09-27 LAB — VITAMIN B12: Vitamin B-12: 289 pg/mL (ref 180–914)

## 2017-09-27 LAB — BASIC METABOLIC PANEL
ANION GAP: 15 (ref 5–15)
BUN: 53 mg/dL — ABNORMAL HIGH (ref 6–20)
CHLORIDE: 97 mmol/L — AB (ref 101–111)
CO2: 24 mmol/L (ref 22–32)
CREATININE: 7.6 mg/dL — AB (ref 0.44–1.00)
Calcium: 8.8 mg/dL — ABNORMAL LOW (ref 8.9–10.3)
GFR calc non Af Amer: 5 mL/min — ABNORMAL LOW (ref >60)
GFR, EST AFRICAN AMERICAN: 6 mL/min — AB (ref >60)
Glucose, Bld: 114 mg/dL — ABNORMAL HIGH (ref 65–99)
POTASSIUM: 3.6 mmol/L (ref 3.5–5.1)
SODIUM: 136 mmol/L (ref 135–145)

## 2017-09-27 LAB — CBG MONITORING, ED
GLUCOSE-CAPILLARY: 83 mg/dL (ref 65–99)
Glucose-Capillary: 101 mg/dL — ABNORMAL HIGH (ref 65–99)

## 2017-09-27 LAB — TSH: TSH: 0.767 u[IU]/mL (ref 0.350–4.500)

## 2017-09-27 LAB — GLUCOSE, CAPILLARY: GLUCOSE-CAPILLARY: 83 mg/dL (ref 65–99)

## 2017-09-27 LAB — LACTIC ACID, PLASMA: LACTIC ACID, VENOUS: 1.2 mmol/L (ref 0.5–1.9)

## 2017-09-27 LAB — MRSA PCR SCREENING: MRSA by PCR: NEGATIVE

## 2017-09-27 MED ORDER — SODIUM CHLORIDE 0.9 % IV SOLN: 100.0000 mL | INTRAVENOUS | Status: DC | PRN

## 2017-09-27 MED ORDER — LIDOCAINE-PRILOCAINE 2.5-2.5 % EX CREA: 1.0000 "application " | TOPICAL_CREAM | CUTANEOUS | Status: DC | PRN

## 2017-09-27 MED ORDER — ALTEPLASE 2 MG IJ SOLR
2.0000 mg | Freq: Once | INTRAMUSCULAR | Status: DC | PRN
  Filled 2017-09-27: qty 2

## 2017-09-27 MED ORDER — HEPARIN SODIUM (PORCINE) 1000 UNIT/ML DIALYSIS
1000.0000 [IU] | INTRAMUSCULAR | Status: DC | PRN
  Filled 2017-09-27: qty 1

## 2017-09-27 MED ORDER — ALLOPURINOL 300 MG PO TABS
150.0000 mg | ORAL_TABLET | Freq: Every day | ORAL | Status: DC
  Administered 2017-09-27 – 2017-10-02 (×6): 150 mg via ORAL
  Filled 2017-09-27 (×7): qty 1

## 2017-09-27 MED ORDER — POLYVINYL ALCOHOL 1.4 % OP SOLN
1.0000 [drp] | OPHTHALMIC | Status: DC | PRN
  Filled 2017-09-27: qty 15

## 2017-09-27 MED ORDER — ASPIRIN EC 81 MG PO TBEC
81.0000 mg | DELAYED_RELEASE_TABLET | Freq: Every day | ORAL | Status: DC
  Administered 2017-09-27 – 2017-10-02 (×6): 81 mg via ORAL
  Filled 2017-09-27 (×6): qty 1

## 2017-09-27 MED ORDER — PENTAFLUOROPROP-TETRAFLUOROETH EX AERO: 1.0000 "application " | INHALATION_SPRAY | CUTANEOUS | Status: DC | PRN

## 2017-09-27 MED ORDER — LIDOCAINE HCL (PF) 1 % IJ SOLN: 5.0000 mL | INTRAMUSCULAR | Status: DC | PRN

## 2017-09-27 NOTE — ED Notes (Signed)
Pt finished breakfast tray.  Pt no complaints.  No distress

## 2017-09-27 NOTE — ED Notes (Signed)
Admitting Provider at bedside (aware no urine sample provided- pt is a dialysis pt)

## 2017-09-27 NOTE — Progress Notes (Signed)
[facility] PROGRESS NOTE  Sonya [name] EAV:[id number] DOB: 02-Jan-1951 DOA: 09/26/2017 PCP: Redmond School, MD  Interim summary and HPI 67 y.o. female with medical history significant of anemia, anxiety, ESRD on hemodialysis Tuesday/Thursday/Saturday, diverticulosis/diverticulitis, history of dysphagia, history of fracture of left femoral neck, GERD, hypertension/history of hypertensive urgency, hyperparathyroidism due to renal insufficiency, hypothyroidism, history of intracerebral hemorrhage, meningocele, protein calorie malnutrition, stroke, history of uterine cancer, history of vertical diplopia and vertigo; who presented to ED with complaints of AMS, confusion and slurred speech. These symptoms in the presence of hypoglycemia. Admitted to complete work up for CVA (found in her MRI exam) and AMS.  Assessment/Plan: 1-AMS and confusion: in the setting of hypoglycemia; but with risk factors and concerns for TIA Vs stroke. -symptoms resolved after CBG's corrected -MRI demonstrated punctate acute ischemia within inferomedial right temporal lobe. -cased discussed with neurology, (Dr. Cheral Marker) who believe her symptoms are not related with images findings.  -patient started on ASA -complete work up for TIA and stroke to be completed. -TSH and B12 WNL  2-hypoglycemia -in the setting of poor PO intake -patient reported decrease appetite after HD on Saturday  -no resolved -eating again -will monitor CBG's  3-ESRD -nephrology consulted to continue HD  4-secondary hyperparathyroidism  -continue sensipar  5-anemia in chronic kidney disease: ESRD -epogen and IV iron as per nephrology discretion -HGB stable -no signs of acute bleeding   6-gout -no flare appreciated -will continue allopurinol  7-essential HTN -continue home antihypertensive regimen -HD also helping controlling BP  8-HLD -continue statins  Code Status: Full Family Communication: daughter at bedside   Disposition Plan: complete work up for TIA; start patient on ASA; follow CBG's in the next 24 hours and perform HD as she is due for treatment today.   Consultants:  Nephrology   Procedures:  See below for x-ray reports   Carotid dopplers: Minor carotid atherosclerosis. No hemodynamically significant ICA stenosis. Degree of narrowing less than 50% bilaterally by ultrasound criteria. Patent antegrade vertebral flow bilaterally in the cervical region.  Antibiotics:  None   HPI/Subjective: Afebrile, no CP, no nausea, no vomiting, no abd pain and currently oriented X2 (missed time/date).  Objective: Vitals:   09/27/17 2030 09/27/17 2100  BP: 117/65 127/68  Pulse: 98 90  Resp:    Temp:    SpO2:      Intake/Output Summary (Last 24 hours) at 09/27/2017 2117 Last data filed at 09/27/2017 1800 Gross per 24 hour  Intake 675.42 ml  Output -  Net 675.42 ml   Filed Weights   09/26/17 1448 09/27/17 1610 09/27/17 1720  Weight: 68 kg (150 lb) 55.6 kg (122 lb 9.2 oz) 55.6 kg (122 lb 9.2 oz)    Exam:   General:  Afebrile, no CP, no SOB, no nausea or vomiting.  Cardiovascular: S1 and S2, no rubs, no gallops, positive SEM  Respiratory: good air movement, no wheezing  Abdomen: soft, NT, ND, positive BS  Musculoskeletal: no edema, no cyanosis, no clubbing   Data Reviewed: Basic Metabolic Panel: Recent Labs  Lab 09/26/17 1634 09/27/17 0652 09/27/17 1045  NA 136 136 135  K 3.6 3.6 3.7  CL 96* 97* 97*  CO2 21* 24 23  GLUCOSE 122* 114* 139*  BUN 50* 53* 54*  CREATININE 7.20* 7.60* 7.97*  CALCIUM 9.2 8.8* 8.6*  PHOS  --   --  2.1*   Liver Function Tests: Recent Labs  Lab 09/26/17 1634 09/27/17 1045  AST 23  --   ALT  8*  --   ALKPHOS 76  --   BILITOT 0.6  --   PROT 8.1  --   ALBUMIN 3.2* 2.9*   Recent Labs  Lab 09/26/17 1634  LIPASE 52*   CBC: Recent Labs  Lab 09/26/17 1547 09/27/17 0652 09/27/17 1045  WBC 11.7* 5.5 5.0  NEUTROABS 9.5*  --   --    HGB 11.8* 10.6* 10.5*  HCT 37.2 32.0* 32.4*  MCV 74.5* 72.9* 72.5*  PLT 253 237 214   Cardiac Enzymes: Recent Labs  Lab 09/26/17 1634  TROPONINI <0.03   CBG: Recent Labs  Lab 09/26/17 2057 09/26/17 2149 09/27/17 0628 09/27/17 1037 09/27/17 1610  GLUCAP 73 92 83 101* 83    Recent Results (from the past 240 hour(s))  Culture, blood (Routine X 2) w Reflex to ID Panel     Status: None (Preliminary result)   Collection Time: 09/26/17  3:47 PM  Result Value Ref Range Status   Specimen Description BLOOD LEFT WRIST  Final   Special Requests   Final    BOTTLES DRAWN AEROBIC ONLY Blood Culture adequate volume   Culture   Final    NO GROWTH < 24 HOURS Performed at [facility], [address] [place], [place] [address]    Report Status PENDING  Incomplete  Culture, blood (Routine X 2) w Reflex to ID Panel     Status: None (Preliminary result)   Collection Time: 09/26/17  3:48 PM  Result Value Ref Range Status   Specimen Description BLOOD LEFT HAND  Final   Special Requests   Final    BOTTLES DRAWN AEROBIC ONLY Blood Culture adequate volume   Culture   Final    NO GROWTH < 24 HOURS Performed at [facility], [address], [place], [place] [address]    Report Status PENDING  Incomplete  MRSA PCR Screening     Status: None   Collection Time: 09/27/17  4:25 PM  Result Value Ref Range Status   MRSA by PCR NEGATIVE NEGATIVE Final    Comment:        The GeneXpert MRSA Assay (FDA approved for NASAL specimens only), is one component of a comprehensive MRSA colonization surveillance program. It is not intended to diagnose MRSA infection nor to guide or monitor treatment for MRSA infections. Performed at [facility], [address], [place], [place] [address]      Studies: Ct Head Wo Contrast  Result Date: 09/26/2017 CLINICAL DATA:  Altered mental status, recent fall EXAM: CT HEAD WITHOUT CONTRAST TECHNIQUE: Contiguous axial images were obtained from the base of the  skull through the vertex without intravenous contrast. COMPARISON:  01/18/2017 FINDINGS: Brain: Similar age related brain atrophy and extensive chronic white matter microvascular ischemic changes throughout both cerebral hemispheres. No acute intracranial hemorrhage, mass lesion infarction midline shift, herniation, hydrocephalus, or extra-axial fluid collection. No focal mass effect or edema. Cisterns are patent. No cerebellar. Vascular: Intracranial atherosclerosis noted.  Hyperdense vessel. Skull: Intact skull.  Negative for fracture. Sinuses/Orbits: Symmetric orbits. 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 Other: None. IMPRESSION: Stable head CT without contrast. No acute intracranial abnormality or significant change. 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 Electronically Signed   By: Jerilynn Mages.  Shick M.D.   On: 09/26/2017 16:20   Mr [name] Nam Head Wo Contrast  Result Date: 09/26/2017 CLINICAL DATA:  Altered mental status and weakness for 2 days EXAM: MRI HEAD  WITHOUT CONTRAST MRA HEAD WITHOUT CONTRAST TECHNIQUE: Multiplanar, multiecho pulse sequences of the brain and surrounding structures were obtained without intravenous contrast. Angiographic images of the head were obtained using MRA technique without contrast. COMPARISON:  Head CT 09/26/2017 Brain MRI 11/19/2016 FINDINGS: MRI HEAD FINDINGS Brain: The midline structures are normal. Punctate focus of abnormal diffusion restriction within the medial right temporal lobe (series 4, image 68). Multifocal periventricular white matter hyperintensity, most often a result of chronic microvascular ischemia. No mass lesion. No chronic microhemorrhage or cerebral amyloid angiopathy. No hydrocephalus, age advanced atrophy or lobar predominant volume loss. There are scattered foci of chronic microhemorrhage within the brainstem, cerebellum and bilateral  basal ganglia. Skull and upper cervical spine: The visualized skull base, calvarium, upper cervical spine and extracranial soft tissues are normal. Sinuses/Orbits: Meningoencephalocele extending into the left eye vision of the sphenoid sinus, unchanged. Normal orbits. MRA HEAD FINDINGS Intracranial internal carotid arteries: Normal. Anterior cerebral arteries: Normal. Middle cerebral arteries: Mild atherosclerotic irregularity. Posterior communicating arteries: Present on the right. Posterior cerebral arteries: Normal. Basilar artery: Moderate narrowing of the basilar artery, worse proximally, unchanged. Vertebral arteries: The right vertebral artery is occluded proximal to the PICA origin. There is reconstitution of the distal V4 segment. Superior cerebellar arteries: Normal. Anterior inferior cerebellar arteries: Normal. Posterior inferior cerebellar arteries: Visualized on the left only. IMPRESSION: 1. Punctate focus of acute ischemia within the inferomedial right temporal lobe. No associated hemorrhage. 2. 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 3. Chronic ischemic microangiopathy. 4. Unchanged left sphenoid meningoencephalocele. Electronically Signed   By: Ulyses Jarred M.D.   On: 09/26/2017 20:28   Mr Brain Wo Contrast (neuro Protocol)  Result Date: 09/26/2017 CLINICAL DATA:  Altered mental status and weakness for 2 days EXAM: MRI HEAD WITHOUT CONTRAST MRA HEAD WITHOUT CONTRAST TECHNIQUE: Multiplanar, multiecho pulse sequences of the brain and surrounding structures were obtained without intravenous contrast. Angiographic images of the head were obtained using MRA technique without contrast. COMPARISON:  Head CT 09/26/2017 Brain MRI 11/19/2016 FINDINGS: MRI  HEAD FINDINGS Brain: The midline structures are normal. Punctate focus of abnormal diffusion restriction within the medial right temporal lobe (series 4, image 68). Multifocal periventricular white matter hyperintensity, most often a result of chronic microvascular ischemia. No mass lesion. No chronic microhemorrhage or cerebral amyloid angiopathy. No hydrocephalus, age advanced atrophy or lobar predominant volume loss. There are scattered foci of chronic microhemorrhage within the brainstem, cerebellum and bilateral basal ganglia. Skull and upper cervical spine: The visualized skull base, calvarium, upper cervical spine and extracranial soft tissues are normal. Sinuses/Orbits: Meningoencephalocele extending into the left eye vision of the sphenoid sinus, unchanged. Normal orbits. MRA HEAD FINDINGS Intracranial internal carotid arteries: Normal. Anterior cerebral arteries: Normal. Middle cerebral arteries: Mild atherosclerotic irregularity. Posterior communicating arteries: Present on the right. Posterior cerebral arteries: Normal. Basilar artery: Moderate narrowing of the basilar artery, worse proximally, unchanged. Vertebral arteries: The right vertebral artery is occluded proximal to the PICA origin. There is reconstitution of the distal V4 segment. Superior cerebellar arteries: Normal. Anterior inferior cerebellar arteries: Normal. Posterior inferior cerebellar arteries: Visualized on the left only. IMPRESSION: 1. Punctate focus of acute ischemia within the inferomedial right temporal lobe. No associated hemorrhage. 2. 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 3. Chronic ischemic microangiopathy. 4. Unchanged left sphenoid meningoencephalocele. Electronically Signed    By: Ulyses Jarred M.D.   On: 09/26/2017 20:28   US Carotid Bilateral  Result Date: 09/27/2017 CLINICAL DATA:  Altered mental status, stroke symptoms EXAM: BILATERAL CAROTID DUPLEX ULTRASOUND TECHNIQUE: Pearline Cables scale imaging, color Doppler and duplex ultrasound were performed of bilateral carotid and vertebral arteries in the neck. COMPARISON:  09/26/2017 FINDINGS: Criteria: Quantification of carotid stenosis is based on velocity parameters that correlate the residual internal carotid diameter with NASCET-based stenosis levels, using the diameter of the distal internal carotid lumen as the denominator for stenosis measurement. The following velocity measurements were obtained: RIGHT ICA:  94/32 cm/sec CCA:  25/6 cm/sec SYSTOLIC ICA/CCA RATIO:  3.89 DIASTOLIC ICA/CCA RATIO:  4.9 ECA:  107 cm/sec LEFT ICA:  71/21 cm/sec CCA:  373/42 cm/sec SYSTOLIC ICA/CCA RATIO:  0.7 DIASTOLIC ICA/CCA RATIO:  8.76 ECA:  108 cm/sec RIGHT CAROTID ARTERY: Minor echogenic shadowing plaque formation. No hemodynamically significant right ICA stenosis, velocity elevation, or turbulent flow. Degree of narrowing less than 50%. RIGHT VERTEBRAL ARTERY: Antegrade in the cervical region. Please refer to the MRA report. LEFT CAROTID ARTERY: Similar scattered minor echogenic plaque formation. No hemodynamically significant left ICA stenosis, velocity elevation, or turbulent flow. LEFT VERTEBRAL ARTERY:  Antegrade in the cervical region. IMPRESSION: Minor carotid atherosclerosis. No hemodynamically significant ICA stenosis. Degree of narrowing less than 50% bilaterally by ultrasound criteria. Patent antegrade vertebral flow bilaterally in the cervical region. 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 Electronically Signed   By: Jerilynn Mages.  Shick M.D.   On: 09/27/2017 11:51   Dg Chest Port 1 View  Result Date: 09/26/2017 CLINICAL DATA:  Altered mental status EXAM: PORTABLE CHEST 1  VIEW COMPARISON:  01/18/2017, CT chest 01/18/2017 FINDINGS: Patchy atelectasis or minimal infiltrate at the right base. No pleural effusion or consolidation. Stable cardiomediastinal silhouette with aortic atherosclerosis. No pneumothorax. IMPRESSION: Minimal patchy opacity at the right base, favor atelectasis. Otherwise no radiographic evidence for acute cardiopulmonary abnormality. Electronically Signed   By: Donavan Foil M.D.   On: 09/26/2017 15:08    Scheduled Meds: . allopurinol  150 mg Oral Daily  . aspirin EC  81 mg Oral Daily  . atorvastatin  10 mg Oral Daily  . cinacalcet  30 mg Oral Q breakfast  . folic acid  2 mg Oral Daily  . levothyroxine  50 mcg Oral QAC breakfast  . lidocaine-prilocaine  1 application Topical Q O,TL,[id number]  . NIFEdipine  60 mg Oral QHS  . pantoprazole  40 mg Oral BID AC  . Vitamin D (Ergocalciferol)  50,000 Units Oral Q7 days   Continuous Infusions: . sodium chloride    . sodium chloride    . dextrose 25 mL/hr at 09/26/17 2301     Time spent: 30 minutes   [facility] Pager [phone]. If 7PM-7AM, please contact night-coverage at [email or web address], password [facility] 09/27/2017, 9:17 PM  LOS: 0 days

## 2017-09-27 NOTE — ED Notes (Signed)
Pt sitting on stretcher eating lunch.  No distress.

## 2017-09-27 NOTE — ED Notes (Signed)
Pt sitting up eating

## 2017-09-27 NOTE — Consult Note (Signed)
Reason for Consult: End-stage renal disease Referring Physician: Dr. Rachel Moulds is an 67 y.o. female.  HPI: She is a patient who has history of hypertension, intracerebellar hemorrhage with meningocele, hypothyroidism and end-stage renal disease on maintenance hemodialysis presently was brought to emergency room because of confusion.  Patient is compliant with her dialysis and her last dialysis was on Saturday.  When her blood sugar was checked by EMS was found to have severe hypoglycemia and treated with D50.  At this moment patient seems to be feeling better.  Patient was last admitted to the hospital because of similar issue of hypoglycemia.  Presently patient denies any nausea or vomiting.  She complains of poor appetite.  Denies also any difficulty breathing.  Past Medical History:  Diagnosis Date  . Anemia   . Anxiety   . CKD (chronic kidney disease)   . Diverticulitis   . Dysphagia   . ESRD (end stage renal disease) ([place])    On HD  . Fracture of femoral neck, left, closed ([place]) 05/30/2015  . Gastritis 06/2013 & 05/2015  . Gastroesophageal reflux disease with stricture 05/2015  . GERD (gastroesophageal reflux disease)   . HTN (hypertension)   . Hyperparathyroidism due to renal insufficiency ([place])   . Hypertensive urgency 06/2013 & 04/2015  . Hypothyroidism   . ICH (intracerebral hemorrhage) ([place]) 10/2014  . 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 Protein calorie malnutrition ([place])   . Stroke ([place])   . Uterine cancer ([place]) 2012  . Vertical diplopia   . Vertigo     Past Surgical History:  Procedure Laterality Date  . ABDOMINAL HYSTERECTOMY    . APPENDECTOMY    . BASCILIC VEIN TRANSPOSITION Right 09/17/2013   Procedure: BRACHIAL VEIN TRANSPOSITION;  Surgeon: Conrad Hazel Run, MD;  Location: [facility];  Service: Vascular;  Laterality: Right;  . BASCILIC VEIN TRANSPOSITION Right 10/29/2013   Procedure: RIGHT SECOND STAGE BRACHIAL VEIN TRANSPOSITION;  Surgeon: Conrad St. Robert, MD;  Location: [facility];   Service: Vascular;  Laterality: Right;  . CESAREAN SECTION    . CHOLECYSTECTOMY    . COLONOSCOPY  2000   TICS, IH  . COLONOSCOPY  2003 NUR BRBPR D50 V6   DC/[place] TICS, IH  . COLONOSCOPY N/A 09/04/2013   SLF: Small ulcer in the descending colon, one colon polyp (tubular adenoma), large internal hemorrhoids, moderate diverticulosis. next tcs 10 years.  . ESOPHAGOGASTRODUODENOSCOPY N/A 05/23/2015   EVO:JJKK non-erosive gastritis/stricture at the gastroesophageal junction  . ESOPHAGOGASTRODUODENOSCOPY (EGD) WITH ESOPHAGEAL DILATION N/A 06/08/2013   Dr. Fields:Stricture at the gastroesophageal junction/MILD NON-erosive gastritis (inflammation) was found in the gastric antrum; multiple biopsies/NO SOURCE FOR ANEMIA IDETIFIED-MOST LIKELY DUE TO ANEMIA OF CHRONIC DISEASE  . FLEXIBLE SIGMOIDOSCOPY N/A 01/02/2016   Dr. Oneida Alar: external hemorrhoids, diverticulosis, internal hemorrhoids, proximal rectum normal  . GIVENS CAPSULE STUDY N/A 12/03/2013   SLF: occasional gastric erosion. small bowel normal  . HIP PINNING,CANNULATED Left 05/31/2015   Procedure: CANNULATED HIP PINNING;  Surgeon: Leandrew Koyanagi, MD;  Location: [facility];  Service: Orthopedics;  Laterality: Left;  . INSERTION OF DIALYSIS CATHETER Left 09/17/2013   Procedure: INSERTION OF DIALYSIS CATHETER;  Surgeon: Conrad Pra[place], MD;  Location: [facility];  Service: Vascular;  Laterality: Left;  . LAPAROSCOPIC TOTAL HYSTERECTOMY    . NASAL SEPTUM SURGERY    . SAVORY DILATION N/A 05/23/2015   Procedure: SAVORY DILATION;  Surgeon: Danie Binder, MD;  [facility];  Service: Endoscopy;  Laterality: N/A;  .  THROMBECTOMY AND REVISION OF ARTERIOVENTOUS (AV) GORETEX  GRAFT Right 07/06/2017   Procedure: THROMBECTOMY AND REVISION OF ARTERIOVENOUS (AV) GORETEX  GRAFT RIGHT UPPER ARM;  Surgeon: Rosetta Posner, MD;  [facility];  Service: Vascular;  Laterality: Right;    Family History  Problem Relation Age of Onset  . Cancer Mother   . Diabetes Mother    . Hypertension Mother   . Diabetes Father   . Hypertension Father   . Hypertension Sister   . Colon cancer Neg Hx     Social History:  reports that  has never smoked. she has never used smokeless tobacco. She reports that she does not drink alcohol or use drugs.  Allergies:  Allergies  Allergen Reactions  . Cephalexin Itching    Medications: I have reviewed the patient's current medications.  Results for orders placed or performed during the hospital encounter of 09/26/17 (from the past 48 hour(s))  CBG monitoring, ED     Status: None   Collection Time: 09/26/17  2:32 PM  Result Value Ref Range   Glucose-Capillary 82 65 - 99 mg/dL  Culture, blood (Routine X 2) w Reflex to ID Panel     Status: None (Preliminary result)   Collection Time: 09/26/17  3:47 PM  Result Value Ref Range   Specimen Description BLOOD LEFT WRIST    Special Requests      BOTTLES DRAWN AEROBIC ONLY Blood Culture adequate volume   Culture      NO GROWTH < 24 HOURS Performed at [facility], [address], [place], [place] [address]    Report Status PENDING   Ethanol     Status: None   Collection Time: 09/26/17  3:47 PM  Result Value Ref Range   Alcohol, Ethyl (B) <10 <10 mg/dL    Comment: Performed at [facility], [address] [place], [place] [address]  CBC with Differential/Platelet     Status: Abnormal   Collection Time: 09/26/17  3:47 PM  Result Value Ref Range   WBC 11.7 (H) 4.0 - 10.5 K/uL   RBC 4.99 3.87 - 5.11 MIL/uL   Hemoglobin 11.8 (L) 12.0 - 15.0 g/dL   HCT 37.2 36.0 - 46.0 %   MCV 74.5 (L) 78.0 - 100.0 fL   MCH 23.6 (L) 26.0 - 34.0 pg   MCHC 31.7 30.0 - 36.0 g/dL   RDW 17.7 (H) 11.5 - 15.5 %   Platelets 253 150 - 400 K/uL   Neutrophils Relative % 81 %   Lymphocytes Relative 12 %   Monocytes Relative 7 %   Eosinophils Relative 0 %   Basophils Relative 0 %   Neutro Abs 9.5 (H) 1.7 - 7.7 K/uL   Lymphs Abs 1.4 0.7 - 4.0 K/uL   Monocytes Absolute 0.8 0.1 - 1.0 K/uL    Eosinophils Absolute 0.0 0.0 - 0.7 K/uL   Basophils Absolute 0.0 0.0 - 0.1 K/uL   RBC Morphology TARGET CELLS    WBC Morphology MILD LEFT SHIFT (1-5% METAS, OCC MYELO, OCC BANDS)     Comment: Performed at [facility], [address] [place], [place] [address]  Culture, blood (Routine X 2) w Reflex to ID Panel     Status: None (Preliminary result)   Collection Time: 09/26/17  3:48 PM  Result Value Ref Range   Specimen Description BLOOD LEFT HAND    Special Requests      BOTTLES DRAWN AEROBIC ONLY Blood Culture adequate volume   Culture  NO GROWTH < 24 HOURS Performed at [facility], [address], [place], [place] [address]    Report Status PENDING   Lactic acid, plasma     Status: Abnormal   Collection Time: 09/26/17  3:48 PM  Result Value Ref Range   Lactic Acid, Venous 3.1 (HH) 0.5 - 1.9 mmol/L    Comment: CRITICAL RESULT CALLED TO, READ BACK BY AND VERIFIED WITH: NORMAN,R AT 1655 ON 2.25.19 BY ISLEY,B Performed at [facility], [address], [place], [place] [address]   Comprehensive metabolic panel     Status: Abnormal   Collection Time: 09/26/17  4:34 PM  Result Value Ref Range   Sodium 136 135 - 145 mmol/L   Potassium 3.6 3.5 - 5.1 mmol/L   Chloride 96 (L) 101 - 111 mmol/L   CO2 21 (L) 22 - 32 mmol/L   Glucose, Bld 122 (H) 65 - 99 mg/dL   BUN 50 (H) 6 - 20 mg/dL   Creatinine, Ser 7.20 (H) 0.44 - 1.00 mg/dL   Calcium 9.2 8.9 - 10.3 mg/dL   Total Protein 8.1 6.5 - 8.1 g/dL   Albumin 3.2 (L) 3.5 - 5.0 g/dL   AST 23 15 - 41 U/L   ALT 8 (L) 14 - 54 U/L   Alkaline Phosphatase 76 38 - 126 U/L   Total Bilirubin 0.6 0.3 - 1.2 mg/dL   GFR calc non Af Amer 5 (L) >60 mL/min   GFR calc Af Amer 6 (L) >60 mL/min    Comment: (NOTE) The eGFR has been calculated using the CKD EPI equation. This calculation has not been validated in all clinical situations. eGFR's persistently <60 mL/min signify possible Chronic Kidney Disease.    Anion gap 19 (H) 5 - 15    Comment:  Performed at [facility], [address] [place], [place] [address]  Lipase, blood     Status: Abnormal   Collection Time: 09/26/17  4:34 PM  Result Value Ref Range   Lipase 52 (H) 11 - 51 U/L    Comment: Performed at [facility], [address] [place], [place] [address]  Troponin I     Status: None   Collection Time: 09/26/17  4:34 PM  Result Value Ref Range   Troponin I <0.03 <0.03 ng/mL    Comment: Performed at [facility], [address], [place], [place] [address]  Lactic acid, plasma     Status: Abnormal   Collection Time: 09/26/17  8:51 PM  Result Value Ref Range   Lactic Acid, Venous 2.8 (HH) 0.5 - 1.9 mmol/L    Comment: CRITICAL RESULT CALLED TO, READ BACK BY AND VERIFIED WITH: GIBSON,K ON 09/26/17 AT 2200 BY LOY,C Performed at [facility], [address] [place], [place] [address]   CBG monitoring, ED     Status: None   Collection Time: 09/26/17  8:57 PM  Result Value Ref Range   Glucose-Capillary 73 65 - 99 mg/dL   Comment 1 Notify RN   CBG monitoring, ED     Status: None   Collection Time: 09/26/17  9:49 PM  Result Value Ref Range   Glucose-Capillary 92 65 - 99 mg/dL  CBG monitoring, ED     Status: None   Collection Time: 09/27/17  6:28 AM  Result Value Ref Range   Glucose-Capillary 83 65 - 99 mg/dL  Basic metabolic panel     Status: Abnormal   Collection Time: 09/27/17  6:52 AM  Result Value Ref Range  Sodium 136 135 - 145 mmol/L   Potassium 3.6 3.5 - 5.1 mmol/L   Chloride 97 (L) 101 - 111 mmol/L   CO2 24 22 - 32 mmol/L   Glucose, Bld 114 (H) 65 - 99 mg/dL   BUN 53 (H) 6 - 20 mg/dL   Creatinine, Ser 7.60 (H) 0.44 - 1.00 mg/dL   Calcium 8.8 (L) 8.9 - 10.3 mg/dL   GFR calc non Af Amer 5 (L) >60 mL/min   GFR calc Af Amer 6 (L) >60 mL/min    Comment: (NOTE) The eGFR has been calculated using the CKD EPI equation. This calculation has not been validated in all clinical situations. eGFR's persistently <60 mL/min signify possible Chronic Kidney Disease.     Anion gap 15 5 - 15    Comment: Performed at [facility], [address], [place], [place] [address]  CBC     Status: Abnormal   Collection Time: 09/27/17  6:52 AM  Result Value Ref Range   WBC 5.5 4.0 - 10.5 K/uL   RBC 4.39 3.87 - 5.11 MIL/uL   Hemoglobin 10.6 (L) 12.0 - 15.0 g/dL   HCT 32.0 (L) 36.0 - 46.0 %   MCV 72.9 (L) 78.0 - 100.0 fL   MCH 24.1 (L) 26.0 - 34.0 pg   MCHC 33.1 30.0 - 36.0 g/dL   RDW 17.4 (H) 11.5 - 15.5 %   Platelets 237 150 - 400 K/uL    Comment: PLATELET COUNT CONFIRMED BY SMEAR Performed at [facility], [address] [place], [place] [address]   TSH     Status: None   Collection Time: 09/27/17  6:52 AM  Result Value Ref Range   TSH 0.767 0.350 - 4.500 uIU/mL    Comment: Performed by a 3rd Generation assay with a functional sensitivity of <=0.01 uIU/mL. Performed at [facility], [address], [place], [place] [address]   Lactic acid, plasma     Status: None   Collection Time: 09/27/17  6:52 AM  Result Value Ref Range   Lactic Acid, Venous 1.2 0.5 - 1.9 mmol/L    Comment: Performed at [facility], [address], [place], [place] [address]    Ct Head Wo Contrast  Result Date: 09/26/2017 CLINICAL DATA:  Altered mental status, recent fall EXAM: CT HEAD WITHOUT CONTRAST TECHNIQUE: Contiguous axial images were obtained from the base of the skull through the vertex without intravenous contrast. COMPARISON:  01/18/2017 FINDINGS: Brain: Similar age related brain atrophy and extensive chronic white matter microvascular ischemic changes throughout both cerebral hemispheres. No acute intracranial hemorrhage, mass lesion infarction midline shift, herniation, hydrocephalus, or extra-axial fluid collection. No focal mass effect or edema. Cisterns are patent. No cerebellar. Vascular: Intracranial atherosclerosis noted.  Hyperdense vessel. Skull: Intact skull.  Negative for fracture. Sinuses/Orbits: Symmetric orbits. 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 Other: None. IMPRESSION: Stable head CT without contrast. No acute intracranial abnormality or significant change. 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 Electronically Signed   By: Jerilynn Mages.  Shick M.D.   On: 09/26/2017 16:20   Mr [name] Nam Head Wo Contrast  Result Date: 09/26/2017 CLINICAL DATA:  Altered mental status and weakness for 2 days EXAM: MRI HEAD WITHOUT CONTRAST MRA HEAD WITHOUT CONTRAST TECHNIQUE: Multiplanar, multiecho pulse sequences of the brain and surrounding structures were obtained without intravenous contrast. Angiographic images of the head were obtained using MRA technique without contrast. COMPARISON:  Head CT  09/26/2017 Brain MRI 11/19/2016 FINDINGS: MRI HEAD FINDINGS Brain: The midline structures are normal. Punctate focus of abnormal diffusion restriction within the medial right temporal lobe (series 4, image 68). Multifocal periventricular white matter hyperintensity, most often a result of chronic microvascular ischemia. No mass lesion. No chronic microhemorrhage or cerebral amyloid angiopathy. No hydrocephalus, age advanced atrophy or lobar predominant volume loss. There are scattered foci of chronic microhemorrhage within the brainstem, cerebellum and bilateral basal ganglia. Skull and upper cervical spine: The visualized skull base, calvarium, upper cervical spine and extracranial soft tissues are normal. Sinuses/Orbits: Meningoencephalocele extending into the left eye vision of the sphenoid sinus, unchanged. Normal orbits. MRA HEAD FINDINGS Intracranial internal carotid arteries: Normal. Anterior cerebral arteries: Normal. Middle cerebral arteries: Mild atherosclerotic irregularity. Posterior communicating arteries: Present on the right. Posterior cerebral arteries: Normal. Basilar artery: Moderate narrowing of the basilar artery, worse proximally, unchanged. Vertebral arteries:  The right vertebral artery is occluded proximal to the PICA origin. There is reconstitution of the distal V4 segment. Superior cerebellar arteries: Normal. Anterior inferior cerebellar arteries: Normal. Posterior inferior cerebellar arteries: Visualized on the left only. IMPRESSION: 1. Punctate focus of acute ischemia within the inferomedial right temporal lobe. No associated hemorrhage. 2. 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 3. Chronic ischemic microangiopathy. 4. Unchanged left sphenoid meningoencephalocele. Electronically Signed   By: Ulyses Jarred M.D.   On: 09/26/2017 20:28   Mr Brain Wo Contrast (neuro Protocol)  Result Date: 09/26/2017 CLINICAL DATA:  Altered mental status and weakness for 2 days EXAM: MRI HEAD WITHOUT CONTRAST MRA HEAD WITHOUT CONTRAST TECHNIQUE: Multiplanar, multiecho pulse sequences of the brain and surrounding structures were obtained without intravenous contrast. Angiographic images of the head were obtained using MRA technique without contrast. COMPARISON:  Head CT 09/26/2017 Brain MRI 11/19/2016 FINDINGS: MRI HEAD FINDINGS Brain: The midline structures are normal. Punctate focus of abnormal diffusion restriction within the medial right temporal lobe (series 4, image 68). Multifocal periventricular white matter hyperintensity, most often a result of chronic microvascular ischemia. No mass lesion. No chronic microhemorrhage or cerebral amyloid angiopathy. No hydrocephalus, age advanced atrophy or lobar predominant volume loss. There are scattered foci of chronic microhemorrhage within the brainstem, cerebellum and bilateral basal ganglia. Skull and upper cervical spine: The visualized skull base, calvarium, upper cervical spine and  extracranial soft tissues are normal. Sinuses/Orbits: Meningoencephalocele extending into the left eye vision of the sphenoid sinus, unchanged. Normal orbits. MRA HEAD FINDINGS Intracranial internal carotid arteries: Normal. Anterior cerebral arteries: Normal. Middle cerebral arteries: Mild atherosclerotic irregularity. Posterior communicating arteries: Present on the right. Posterior cerebral arteries: Normal. Basilar artery: Moderate narrowing of the basilar artery, worse proximally, unchanged. Vertebral arteries: The right vertebral artery is occluded proximal to the PICA origin. There is reconstitution of the distal V4 segment. Superior cerebellar arteries: Normal. Anterior inferior cerebellar arteries: Normal. Posterior inferior cerebellar arteries: Visualized on the left only. IMPRESSION: 1. Punctate focus of acute ischemia within the inferomedial right temporal lobe. No associated hemorrhage. 2. 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 3. Chronic ischemic microangiopathy. 4.  Unchanged left sphenoid meningoencephalocele. Electronically Signed   By: Ulyses Jarred M.D.   On: 09/26/2017 20:28   Dg Chest Port 1 View  Result Date: 09/26/2017 CLINICAL DATA:  Altered mental status EXAM: PORTABLE CHEST 1 VIEW COMPARISON:  01/18/2017, CT chest 01/18/2017 FINDINGS: Patchy atelectasis or minimal infiltrate at the right base. No pleural effusion or consolidation. Stable cardiomediastinal silhouette with aortic atherosclerosis. No pneumothorax. IMPRESSION: Minimal patchy opacity at the right base, favor atelectasis. Otherwise no radiographic evidence for acute cardiopulmonary abnormality. Electronically Signed   By: Donavan Foil M.D.   On: 09/26/2017 15:08    Review  of Systems  Constitutional: Positive for malaise/fatigue. Negative for fever.  Respiratory: Negative for cough and shortness of breath.   Cardiovascular: Negative for orthopnea.  Gastrointestinal: Negative for abdominal pain, constipation, nausea and vomiting.       Complains of poor appetite and not eating that well   Blood pressure 107/60, pulse 90, temperature 98.9 F (37.2 C), resp. rate 18, height _0  (1.626 m), weight 68 kg (150 lb), SpO2 98 %. Physical Exam  Constitutional: She is oriented to person, place, and time. No distress.  HENT:  Mouth/Throat: No oropharyngeal exudate.  Neck: No JVD present.  Cardiovascular: Normal rate and regular rhythm.  No murmur heard. Respiratory: No respiratory distress. She has no wheezes. She has no rales.  GI: She exhibits no distension. There is no tenderness.  Musculoskeletal: She exhibits no edema.  Neurological: She is alert and oriented to person, place, and time.    Assessment/Plan: 1] altered mental status: Seems to be secondary to hypoglycemia.  Etiology for her hypoglycemia at this moment is no clear.  Patient is not taking any hypoglycemic agent.  She seems to be somewhat better.  She has previous history of CVA and also intracerebral hemorrhage from which she has recovered. 2] hypertension: Her blood pressure is reasonably controlled 3] end-stage renal disease: She is status post hemodialysis on Saturday.  Her potassium is normal.  Patient is due for dialysis today which is her regular schedule. 4] anemia: Her hemoglobin is within our target goal 5] bone and mineral disorder: Her calcium is a range 6] fluid management patient does not have any significant sign of fluid overload. 7] hypothyroidism Plan: We will make arrangement for patient to get dialysis today once she is admitted to the hospital for 3-1/2 hours 2] will remove about 24/2 L if systolic blood pressure remains above 90 3] we will check her CBC and renal panel in the  morning  Yaeko Fazekas S 09/27/2017, 8:28 AM

## 2017-09-27 NOTE — ED Notes (Signed)
Pt no distress.   Lungs clear.  Watching television.  PT asked why she is here.  Explained to pt that she is being admitted.

## 2017-09-27 NOTE — Procedures (Signed)
    HEMODIALYSIS TREATMENT NOTE:   3.5 hour heparin-free dialysis completed via right upper arm AVG (16g ante/retrograde). Goal met: 1.5L removed without interruption in ultrafiltration. All blood was returned and hemostasis was achieved within 15 minutes. Report given to L. Marcello Moores, RN.  Rockwell Alexandria, RN, CDN

## 2017-09-28 ENCOUNTER — Inpatient Hospital Stay (HOSPITAL_COMMUNITY): Payer: Medicare Other

## 2017-09-28 DIAGNOSIS — E162 Hypoglycemia, unspecified: Secondary | ICD-10-CM

## 2017-09-28 DIAGNOSIS — I672 Cerebral atherosclerosis: Secondary | ICD-10-CM | POA: Diagnosis not present

## 2017-09-28 DIAGNOSIS — E872 Acidosis: Secondary | ICD-10-CM | POA: Diagnosis present

## 2017-09-28 DIAGNOSIS — D631 Anemia in chronic kidney disease: Secondary | ICD-10-CM

## 2017-09-28 DIAGNOSIS — M109 Gout, unspecified: Secondary | ICD-10-CM | POA: Diagnosis present

## 2017-09-28 DIAGNOSIS — Z992 Dependence on renal dialysis: Secondary | ICD-10-CM

## 2017-09-28 DIAGNOSIS — Z79890 Hormone replacement therapy (postmenopausal): Secondary | ICD-10-CM | POA: Diagnosis not present

## 2017-09-28 DIAGNOSIS — E039 Hypothyroidism, unspecified: Secondary | ICD-10-CM | POA: Diagnosis present

## 2017-09-28 DIAGNOSIS — N186 End stage renal disease: Secondary | ICD-10-CM

## 2017-09-28 DIAGNOSIS — E782 Mixed hyperlipidemia: Secondary | ICD-10-CM | POA: Diagnosis not present

## 2017-09-28 DIAGNOSIS — Z9049 Acquired absence of other specified parts of digestive tract: Secondary | ICD-10-CM | POA: Diagnosis not present

## 2017-09-28 DIAGNOSIS — N2581 Secondary hyperparathyroidism of renal origin: Secondary | ICD-10-CM | POA: Diagnosis not present

## 2017-09-28 DIAGNOSIS — E871 Hypo-osmolality and hyponatremia: Secondary | ICD-10-CM | POA: Diagnosis present

## 2017-09-28 DIAGNOSIS — I639 Cerebral infarction, unspecified: Secondary | ICD-10-CM

## 2017-09-28 DIAGNOSIS — Z90710 Acquired absence of both cervix and uterus: Secondary | ICD-10-CM | POA: Diagnosis not present

## 2017-09-28 DIAGNOSIS — R4182 Altered mental status, unspecified: Secondary | ICD-10-CM | POA: Diagnosis not present

## 2017-09-28 DIAGNOSIS — E876 Hypokalemia: Secondary | ICD-10-CM | POA: Diagnosis not present

## 2017-09-28 DIAGNOSIS — E785 Hyperlipidemia, unspecified: Secondary | ICD-10-CM | POA: Diagnosis present

## 2017-09-28 DIAGNOSIS — Z881 Allergy status to other antibiotic agents status: Secondary | ICD-10-CM | POA: Diagnosis not present

## 2017-09-28 DIAGNOSIS — Z809 Family history of malignant neoplasm, unspecified: Secondary | ICD-10-CM | POA: Diagnosis not present

## 2017-09-28 DIAGNOSIS — Z8249 Family history of ischemic heart disease and other diseases of the circulatory system: Secondary | ICD-10-CM | POA: Diagnosis not present

## 2017-09-28 DIAGNOSIS — I10 Essential (primary) hypertension: Secondary | ICD-10-CM | POA: Diagnosis not present

## 2017-09-28 DIAGNOSIS — G9341 Metabolic encephalopathy: Secondary | ICD-10-CM

## 2017-09-28 DIAGNOSIS — F419 Anxiety disorder, unspecified: Secondary | ICD-10-CM | POA: Diagnosis present

## 2017-09-28 DIAGNOSIS — F0390 Unspecified dementia without behavioral disturbance: Secondary | ICD-10-CM | POA: Diagnosis present

## 2017-09-28 DIAGNOSIS — I361 Nonrheumatic tricuspid (valve) insufficiency: Secondary | ICD-10-CM | POA: Diagnosis not present

## 2017-09-28 DIAGNOSIS — G9340 Encephalopathy, unspecified: Secondary | ICD-10-CM | POA: Diagnosis not present

## 2017-09-28 DIAGNOSIS — R4189 Other symptoms and signs involving cognitive functions and awareness: Secondary | ICD-10-CM | POA: Diagnosis present

## 2017-09-28 DIAGNOSIS — K219 Gastro-esophageal reflux disease without esophagitis: Secondary | ICD-10-CM | POA: Diagnosis present

## 2017-09-28 DIAGNOSIS — Z833 Family history of diabetes mellitus: Secondary | ICD-10-CM | POA: Diagnosis not present

## 2017-09-28 LAB — CBC
HEMATOCRIT: 34.5 % — AB (ref 36.0–46.0)
Hemoglobin: 11.2 g/dL — ABNORMAL LOW (ref 12.0–15.0)
MCH: 23.6 pg — ABNORMAL LOW (ref 26.0–34.0)
MCHC: 32.5 g/dL (ref 30.0–36.0)
MCV: 72.8 fL — AB (ref 78.0–100.0)
PLATELETS: 230 10*3/uL (ref 150–400)
RBC: 4.74 MIL/uL (ref 3.87–5.11)
RDW: 17.5 % — AB (ref 11.5–15.5)
WBC: 4.7 10*3/uL (ref 4.0–10.5)

## 2017-09-28 LAB — RENAL FUNCTION PANEL
Albumin: 3 g/dL — ABNORMAL LOW (ref 3.5–5.0)
Anion gap: 13 (ref 5–15)
BUN: 19 mg/dL (ref 6–20)
CHLORIDE: 93 mmol/L — AB (ref 101–111)
CO2: 28 mmol/L (ref 22–32)
CREATININE: 4.11 mg/dL — AB (ref 0.44–1.00)
Calcium: 8.4 mg/dL — ABNORMAL LOW (ref 8.9–10.3)
GFR calc Af Amer: 12 mL/min — ABNORMAL LOW (ref >60)
GFR calc non Af Amer: 10 mL/min — ABNORMAL LOW (ref >60)
GLUCOSE: 128 mg/dL — AB (ref 65–99)
POTASSIUM: 3.4 mmol/L — AB (ref 3.5–5.1)
Phosphorus: 1.9 mg/dL — ABNORMAL LOW (ref 2.5–4.6)
Sodium: 134 mmol/L — ABNORMAL LOW (ref 135–145)

## 2017-09-28 LAB — HEMOGLOBIN A1C
Hgb A1c MFr Bld: 5.5 % (ref 4.8–5.6)
Mean Plasma Glucose: 111.15 mg/dL

## 2017-09-28 LAB — AMMONIA: Ammonia: 25 umol/L (ref 9–35)

## 2017-09-28 MED ORDER — LORAZEPAM 2 MG/ML IJ SOLN
0.5000 mg | Freq: Once | INTRAMUSCULAR | Status: AC
  Administered 2017-09-28: 0.5 mg via INTRAVENOUS
  Filled 2017-09-28: qty 1

## 2017-09-28 MED ORDER — VITAMIN B-12 100 MCG PO TABS
500.0000 ug | ORAL_TABLET | Freq: Every day | ORAL | Status: DC
  Administered 2017-09-28 – 2017-10-02 (×5): 500 ug via ORAL
  Filled 2017-09-28 (×5): qty 5

## 2017-09-28 NOTE — Progress Notes (Signed)
Non emergent voicemail left for daughter to call back to speak with patient to help redirect her and inform her of the where abouts of her belongings. Patient is presently aggressive with staff, tugging at IV site, getting out of bed unassisted and cursing at staff.

## 2017-09-28 NOTE — Progress Notes (Signed)
Sonya [name]  MRN: [id number]  DOB/AGE: 69-Jul-1952 69 y.o.  Primary Care Physician:Fusco, Lyman Bishop, MD  Admit date: 09/26/2017  Chief Complaint:  Chief Complaint  Patient presents with  . Altered Mental Status    S-Pt presented on  09/26/2017 with  Chief Complaint  Patient presents with  . Altered Mental Status  .     Pt offers no new complaints.  meds . allopurinol  150 mg Oral Daily  . aspirin EC  81 mg Oral Daily  . atorvastatin  10 mg Oral Daily  . cinacalcet  30 mg Oral Q breakfast  . folic acid  2 mg Oral Daily  . levothyroxine  50 mcg Oral QAC breakfast  . lidocaine-prilocaine  1 application Topical Q T,Th,Sat-1800  . NIFEdipine  60 mg Oral QHS  . pantoprazole  40 mg Oral BID AC  . Vitamin D (Ergocalciferol)  50,000 Units Oral Q7 days       Physical Exam: Vital signs in last 24 hours: Temp:  [98 F (36.7 C)-98.8 F (37.1 C)] 98 F (36.7 C) (02/27 0300) Pulse Rate:  [78-98] 78 (02/27 0300) Resp:  [15-20] 20 (02/27 0300) BP: (105-143)/(54-75) 128/61 (02/27 0300) SpO2:  [96 %-100 %] 96 % (02/27 0300) Weight:  [114 lb 10.2 oz (52 kg)-122 lb 9.2 oz (55.6 kg)] 114 lb 10.2 oz (52 kg) (02/27 0300) Weight change: -6.8 oz (-12.44 kg) Last BM Date: 09/27/17  Intake/Output from previous day: 02/26 0701 - 02/27 0700 In: 675.4 [P.O.:240; I.V.:435.4] Out: 1500  No intake/output data recorded.   Physical Exam: General- pt is awake,alert,follows comands. Resp- No acute REsp distress, CTA B/L NO Rhonchi CVS- S1S2 regular in rate and rhythm GIT- BS+, soft, NT, ND EXT- NO LE Edema, Cyanosis Access- AVF+   Lab Results: CBC Recent Labs    09/27/17 1045 09/28/17 0622  WBC 5.0 4.7  HGB 10.5* 11.2*  HCT 32.4* 34.5*  PLT 214 230    BMET Recent Labs    09/27/17 1045 09/28/17 0625  NA 135 134*  K 3.7 3.4*  CL 97* 93*  CO2 23 28  GLUCOSE 139* 128*  BUN 54* 19  CREATININE 7.97* 4.11*  CALCIUM 8.6* 8.4*    MICRO Recent Results (from the past  240 hour(s))  Culture, blood (Routine X 2) w Reflex to ID Panel     Status: None (Preliminary result)   Collection Time: 09/26/17  3:47 PM  Result Value Ref Range Status   Specimen Description BLOOD LEFT WRIST  Final   Special Requests   Final    BOTTLES DRAWN AEROBIC ONLY Blood Culture adequate volume   Culture   Final    NO GROWTH 2 DAYS Performed at [facility], [address], [place], [place] [address]    Report Status PENDING  Incomplete  Culture, blood (Routine X 2) w Reflex to ID Panel     Status: None (Preliminary result)   Collection Time: 09/26/17  3:48 PM  Result Value Ref Range Status   Specimen Description BLOOD LEFT HAND  Final   Special Requests   Final    BOTTLES DRAWN AEROBIC ONLY Blood Culture adequate volume   Culture   Final    NO GROWTH 2 DAYS Performed at [facility], [address], [place], [place] [address]    Report Status PENDING  Incomplete  MRSA PCR Screening     Status: None   Collection Time: 09/27/17  4:25 PM  Result Value Ref Range Status  MRSA by PCR NEGATIVE NEGATIVE Final    Comment:        The GeneXpert MRSA Assay (FDA approved for NASAL specimens only), is one component of a comprehensive MRSA colonization surveillance program. It is not intended to diagnose MRSA infection nor to guide or monitor treatment for MRSA infections. 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  Pt on Tuesday/Thursday/Saturday schedule as outpt               Pt was dialyzed yesterday .               No need of dialysis today  2)HTN BP stable Medication- On Calcium Channel Blockers   3)Anemia In  ESRD the goal for HGb is 9--11 HGb is at goal .   Will keep on epo during Hd  4)CKD Mineral-Bone Disorder PTH  elevated  Secondary Hyperparathyroidism present Phosphorus low  5)CNS- hx of cerebral bleed Primary MD following  6)Electrolytes  Hypokalemic minial  Hyponatremic sec to ESRD  7)Acid base Co2 at goal     Plan:  Will continue current care Will hold off on kcl replacement as just below the goal of  3.5 Will dialyze in am  Will use 3k bath    Coni Homesley S 09/28/2017, 9:37 AM

## 2017-09-28 NOTE — Progress Notes (Signed)
Night shift floor coverage note.  The nursing staff is reporting that the patient is very restless at this time.  Yesterday evening, the patient became restless while in hemodialysis and alprazolam 0.5 mg was given.  She had remain calm until recently.  She is now trying to get out of bed unassisted and has been showing aggressiveness towards the staff.  Lorazepam 0.5 mg IVP x1 stat was ordered.  Her symptoms may be due to sundowning after alprazolam effect has passed and/or worsening dementia.  Tennis Must, MD.

## 2017-09-28 NOTE — Plan of Care (Signed)
  Education: Knowledge of General Education information will improve 09/28/2017 0200 - Progressing by Cassandria Anger, RN   Clinical Measurements: Ability to maintain clinical measurements within normal limits will improve 09/28/2017 0200 - Progressing by Cassandria Anger, RN Will remain free from infection 09/28/2017 0200 - Progressing by Cassandria Anger, RN   Activity: Risk for activity intolerance will decrease 09/28/2017 0200 - Progressing by Cassandria Anger, RN   Nutrition: Adequate nutrition will be maintained 09/28/2017 0200 - Progressing by Cassandria Anger, RN   Coping: Level of anxiety will decrease 09/28/2017 0200 - Progressing by Cassandria Anger, RN   Pain Managment: General experience of comfort will improve 09/28/2017 0200 - Progressing by Cassandria Anger, RN

## 2017-09-28 NOTE — Progress Notes (Signed)
PROGRESS NOTE  Sonya [name] HAL:[id number] DOB: 10-24-1950 DOA: 09/26/2017 PCP: Redmond School, MD  Brief History:  67 y.o.femalewith medical history significant of anemia, anxiety, ESRD on hemodialysis Tuesday/Thursday/Saturday, diverticulosis/diverticulitis, history of dysphagia, history of fracture of left femoral neck, GERD, hypertension/history of hypertensive urgency, hyperparathyroidism due to renal insufficiency, hypothyroidism, history of intracerebral hemorrhage, meningocele, protein calorie malnutrition, stroke, history of uterine cancer, history of vertical diplopia and vertigo; who presented to ED with complaints of AMS, confusion and slurred speech. These symptoms in the presence of hypoglycemia. Admitted to complete work up for CVA (found in her MRI exam) and AMS.    Assessment/Plan: Acute ischemic stroke -PT/OT evaluation -Speech therapy eval -CT brain--no acute abnormality -MRI brain--punctate focus of ischemia inferior medial right temporal lobe -MRA brain--basilar artery patent with unchanged severe proximal narrowing.  Distal right vertebral artery narrowing/occlusion -Carotid Duplex--negative for hemodynamically significant stenosis -Echo--pending -LDL--pending -HbA1C--pending -Antiplatelet--aspirin 81 mg daily  Acute metabolic encephalopathy -Multifactorial including acute stroke, low normal B12 level, hypoglycemia, and hospital delirium -The patient remains intermittently confused and agitated requiring IV Ativan -Daughter at bedside relates intermittent episodes of confusion at baseline -Suspect patient has underlying cognitive impairment/dementia -Check ammonia -Serum B12 289  -TSH-0.767   hypoglycemia -Due to poor po intake -Improved  ESRD -Appreciate nephrology consult -Received HD 09/27/2017  Secondary hyperparathyroidism -Continue Sensipar -Management per nephrology  Essential hypertension -Continue nifedipine  -BP  control  Anemia of CKD -Baseline hemoglobin 11-12 -Hemoglobin at baseline  Hyperlipidemia -Continue statin   Disposition Plan:   Home 2/23 if stable Family Communication:   Daughter updated at bedside--Total time spent 35 minutes.  Greater than 50% spent face to face counseling and coordinating care.   Consultants:  renal  Code Status:  FULL   DVT Prophylaxis:  SCDs   Procedures: As Listed in Progress Note Above  Antibiotics: None    Subjective: Patient is intermittently confused.  She denies any headache, chest pain, shortness breath, nausea, vomiting, diarrhea, abdominal pain patient was agitated overnight.  Objective: Vitals:   09/27/17 2030 09/27/17 2100 09/27/17 2115 09/28/17 0300  BP: 117/65 127/68 (!) 141/69 128/61  Pulse: 98 90 88 78  Resp:   16 20  Temp:   98.8 F (37.1 C) 98 F (36.7 C)  TempSrc:   Oral Oral  SpO2:   100% 96%  Weight:   54.5 kg (120 lb 2.4 oz) 52 kg (114 lb 10.2 oz)  Height:        Intake/Output Summary (Last 24 hours) at 09/28/2017 1149 Last data filed at 09/27/2017 2105 Gross per 24 hour  Intake 675.42 ml  Output 1500 ml  Net -824.58 ml   Weight change: -12.44 kg (-6.8 oz) Exam:   General:  Pt is alert, follows commands appropriately, not in acute distress  HEENT: No icterus, No thrush, No neck mass, [place]/AT  Cardiovascular: RRR, S1/S2, no rubs, no gallops  Respiratory: CTA bilaterally, no wheezing, no crackles, no rhonchi  Abdomen: Soft/+BS, non tender, non distended, no guarding  Extremities: No edema, No lymphangitis, No petechiae, No rashes, no synovitis   Data Reviewed: I have personally reviewed following labs and imaging studies Basic Metabolic Panel: Recent Labs  Lab 09/26/17 1634 09/27/17 0652 09/27/17 1045 09/28/17 0625  NA 136 136 135 134*  K 3.6 3.6 3.7 3.4*  CL 96* 97* 97* 93*  CO2 21* 24 23 28   GLUCOSE 122* 114* 139* 128*  BUN 50* 53* 54* 19  CREATININE 7.20* 7.60* 7.97* 4.11*  CALCIUM 9.2  8.8* 8.6* 8.4*  PHOS  --   --  2.1* 1.9*   Liver Function Tests: Recent Labs  Lab 09/26/17 1634 09/27/17 1045 09/28/17 0625  AST 23  --   --   ALT 8*  --   --   ALKPHOS 76  --   --   BILITOT 0.6  --   --   PROT 8.1  --   --   ALBUMIN 3.2* 2.9* 3.0*   Recent Labs  Lab 09/26/17 1634  LIPASE 52*   No results for input(s): AMMONIA in the last 168 hours. Coagulation Profile: No results for input(s): INR, PROTIME in the last 168 hours. CBC: Recent Labs  Lab 09/26/17 1547 09/27/17 0652 09/27/17 1045 09/28/17 0622  WBC 11.7* 5.5 5.0 4.7  NEUTROABS 9.5*  --   --   --   HGB 11.8* 10.6* 10.5* 11.2*  HCT 37.2 32.0* 32.4* 34.5*  MCV 74.5* 72.9* 72.5* 72.8*  PLT 253 237 214 230   Cardiac Enzymes: Recent Labs  Lab 09/26/17 1634  TROPONINI <0.03   BNP: Invalid input(s): POCBNP CBG: Recent Labs  Lab 09/26/17 2057 09/26/17 2149 09/27/17 0628 09/27/17 1037 09/27/17 1610  GLUCAP 73 92 83 101* 83   HbA1C: No results for input(s): HGBA1C in the last 72 hours. Urine analysis:    Component Value Date/Time   COLORURINE YELLOW 04/14/2015 1427   APPEARANCEUR CLEAR 04/14/2015 1427   LABSPEC 1.020 04/14/2015 1427   PHURINE 8.5 (H) 04/14/2015 1427   GLUCOSEU 100 (A) 04/14/2015 1427   HGBUR SMALL (A) 04/14/2015 1427   BILIRUBINUR NEGATIVE 04/14/2015 1427   KETONESUR NEGATIVE 04/14/2015 1427   PROTEINUR >300 (A) 04/14/2015 1427   UROBILINOGEN 0.2 04/14/2015 1427   NITRITE NEGATIVE 04/14/2015 1427   LEUKOCYTESUR TRACE (A) 04/14/2015 1427   Sepsis Labs: @LABRCNTIP (procalcitonin:4,lacticidven:4) ) Recent Results (from the past 240 hour(s))  Culture, blood (Routine X 2) w Reflex to ID Panel     Status: None (Preliminary result)   Collection Time: 09/26/17  3:47 PM  Result Value Ref Range Status   Specimen Description BLOOD LEFT WRIST  Final   Special Requests   Final    BOTTLES DRAWN AEROBIC ONLY Blood Culture adequate volume   Culture   Final    NO GROWTH 2  DAYS Performed at [facility], [address] [place], [place] [address]    Report Status PENDING  Incomplete  Culture, blood (Routine X 2) w Reflex to ID Panel     Status: None (Preliminary result)   Collection Time: 09/26/17  3:48 PM  Result Value Ref Range Status   Specimen Description BLOOD LEFT HAND  Final   Special Requests   Final    BOTTLES DRAWN AEROBIC ONLY Blood Culture adequate volume   Culture   Final    NO GROWTH 2 DAYS Performed at [facility], [address] [place], [place] [address]    Report Status PENDING  Incomplete  MRSA PCR Screening     Status: None   Collection Time: 09/27/17  4:25 PM  Result Value Ref Range Status   MRSA by PCR NEGATIVE NEGATIVE Final    Comment:        The GeneXpert MRSA Assay (FDA approved for NASAL specimens only), is one component of a comprehensive MRSA colonization surveillance program. It is not intended to diagnose MRSA infection nor to guide or monitor treatment for MRSA infections. Performed at [facility],  [address] [place], [place] [address]      Scheduled Meds: . allopurinol  150 mg Oral Daily  . aspirin EC  81 mg Oral Daily  . atorvastatin  10 mg Oral Daily  . cinacalcet  30 mg Oral Q breakfast  . folic acid  2 mg Oral Daily  . levothyroxine  50 mcg Oral QAC breakfast  . lidocaine-prilocaine  1 application Topical Q U,EA,[id number]  . NIFEdipine  60 mg Oral QHS  . pantoprazole  40 mg Oral BID AC  . Vitamin D (Ergocalciferol)  50,000 Units Oral Q7 days   Continuous Infusions: . sodium chloride    . sodium chloride    . dextrose 25 mL/hr at 09/26/17 2301    Procedures/Studies: Ct Head Wo Contrast  Result Date: 09/26/2017 CLINICAL DATA:  Altered mental status, recent fall EXAM: CT HEAD WITHOUT CONTRAST TECHNIQUE: Contiguous axial images were obtained from the base of the skull through the vertex without intravenous contrast. COMPARISON:  01/18/2017 FINDINGS: Brain: Similar age related brain atrophy and  extensive chronic white matter microvascular ischemic changes throughout both cerebral hemispheres. No acute intracranial hemorrhage, mass lesion infarction midline shift, herniation, hydrocephalus, or extra-axial fluid collection. No focal mass effect or edema. Cisterns are patent. No cerebellar. Vascular: Intracranial atherosclerosis noted.  Hyperdense vessel. Skull: Intact skull.  Negative for fracture. Sinuses/Orbits: Symmetric orbits. 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 Other: None. IMPRESSION: Stable head CT without contrast. No acute intracranial abnormality or significant change. 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 Electronically Signed   By: Jerilynn Mages.  Shick M.D.   On: 09/26/2017 16:20   Mr [name] Nam Head Wo Contrast  Result Date: 09/26/2017 CLINICAL DATA:  Altered mental status and weakness for 2 days EXAM: MRI HEAD WITHOUT CONTRAST MRA HEAD WITHOUT CONTRAST TECHNIQUE: Multiplanar, multiecho pulse sequences of the brain and surrounding structures were obtained without intravenous contrast. Angiographic images of the head were obtained using MRA technique without contrast. COMPARISON:  Head CT 09/26/2017 Brain MRI 11/19/2016 FINDINGS: MRI HEAD FINDINGS Brain: The midline structures are normal. Punctate focus of abnormal diffusion restriction within the medial right temporal lobe (series 4, image 68). Multifocal periventricular white matter hyperintensity, most often a result of chronic microvascular ischemia. No mass lesion. No chronic microhemorrhage or cerebral amyloid angiopathy. No hydrocephalus, age advanced atrophy or lobar predominant volume loss. There are scattered foci of chronic microhemorrhage within the brainstem, cerebellum and bilateral basal ganglia. Skull and upper cervical spine: The visualized skull base, calvarium, upper cervical spine and extracranial soft  tissues are normal. Sinuses/Orbits: Meningoencephalocele extending into the left eye vision of the sphenoid sinus, unchanged. Normal orbits. MRA HEAD FINDINGS Intracranial internal carotid arteries: Normal. Anterior cerebral arteries: Normal. Middle cerebral arteries: Mild atherosclerotic irregularity. Posterior communicating arteries: Present on the right. Posterior cerebral arteries: Normal. Basilar artery: Moderate narrowing of the basilar artery, worse proximally, unchanged. Vertebral arteries: The right vertebral artery is occluded proximal to the PICA origin. There is reconstitution of the distal V4 segment. Superior cerebellar arteries: Normal. Anterior inferior cerebellar arteries: Normal. Posterior inferior cerebellar arteries: Visualized on the left only. IMPRESSION: 1. Punctate focus of acute ischemia within the inferomedial right temporal lobe. No associated hemorrhage. 2. 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 The basilar artery remains patent with unchanged severe proximal narrowing of the basilar artery with  moderate mid to distal narrowing. 3. Chronic ischemic microangiopathy. 4. Unchanged left sphenoid meningoencephalocele. Electronically Signed   By: Ulyses Jarred M.D.   On: 09/26/2017 20:28   Mr Brain Wo Contrast (neuro Protocol)  Result Date: 09/26/2017 CLINICAL DATA:  Altered mental status and weakness for 2 days EXAM: MRI HEAD WITHOUT CONTRAST MRA HEAD WITHOUT CONTRAST TECHNIQUE: Multiplanar, multiecho pulse sequences of the brain and surrounding structures were obtained without intravenous contrast. Angiographic images of the head were obtained using MRA technique without contrast. COMPARISON:  Head CT 09/26/2017 Brain MRI 11/19/2016 FINDINGS: MRI HEAD FINDINGS Brain: The midline structures are normal. Punctate focus of abnormal diffusion restriction within the medial right  temporal lobe (series 4, image 68). Multifocal periventricular white matter hyperintensity, most often a result of chronic microvascular ischemia. No mass lesion. No chronic microhemorrhage or cerebral amyloid angiopathy. No hydrocephalus, age advanced atrophy or lobar predominant volume loss. There are scattered foci of chronic microhemorrhage within the brainstem, cerebellum and bilateral basal ganglia. Skull and upper cervical spine: The visualized skull base, calvarium, upper cervical spine and extracranial soft tissues are normal. Sinuses/Orbits: Meningoencephalocele extending into the left eye vision of the sphenoid sinus, unchanged. Normal orbits. MRA HEAD FINDINGS Intracranial internal carotid arteries: Normal. Anterior cerebral arteries: Normal. Middle cerebral arteries: Mild atherosclerotic irregularity. Posterior communicating arteries: Present on the right. Posterior cerebral arteries: Normal. Basilar artery: Moderate narrowing of the basilar artery, worse proximally, unchanged. Vertebral arteries: The right vertebral artery is occluded proximal to the PICA origin. There is reconstitution of the distal V4 segment. Superior cerebellar arteries: Normal. Anterior inferior cerebellar arteries: Normal. Posterior inferior cerebellar arteries: Visualized on the left only. IMPRESSION: 1. Punctate focus of acute ischemia within the inferomedial right temporal lobe. No associated hemorrhage. 2. 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 3. Chronic ischemic microangiopathy. 4. Unchanged left sphenoid meningoencephalocele. Electronically Signed   By: Ulyses Jarred M.D.   On: 09/26/2017 20:28   US Carotid Bilateral  Result Date: 09/27/2017 CLINICAL DATA:  Altered  mental status, stroke symptoms EXAM: BILATERAL CAROTID DUPLEX ULTRASOUND TECHNIQUE: Pearline Cables scale imaging, color Doppler and duplex ultrasound were performed of bilateral carotid and vertebral arteries in the neck. COMPARISON:  09/26/2017 FINDINGS: Criteria: Quantification of carotid stenosis is based on velocity parameters that correlate the residual internal carotid diameter with NASCET-based stenosis levels, using the diameter of the distal internal carotid lumen as the denominator for stenosis measurement. The following velocity measurements were obtained: RIGHT ICA:  94/32 cm/sec CCA:  07/3 cm/sec SYSTOLIC ICA/CCA RATIO:  7.10 DIASTOLIC ICA/CCA RATIO:  4.9 ECA:  107 cm/sec LEFT ICA:  71/21 cm/sec CCA:  626/94 cm/sec SYSTOLIC ICA/CCA RATIO:  0.7 DIASTOLIC ICA/CCA RATIO:  8.54 ECA:  108 cm/sec RIGHT CAROTID ARTERY: Minor echogenic shadowing plaque formation. No hemodynamically significant right ICA stenosis, velocity elevation, or turbulent flow. Degree of narrowing less than 50%. RIGHT VERTEBRAL ARTERY: Antegrade in the cervical region. Please refer to the MRA report. LEFT CAROTID ARTERY: Similar scattered minor echogenic plaque formation. No hemodynamically significant left ICA stenosis, velocity elevation, or turbulent flow. LEFT VERTEBRAL ARTERY:  Antegrade in the cervical region. IMPRESSION: Minor carotid atherosclerosis. No hemodynamically significant ICA stenosis. Degree of narrowing less than 50% bilaterally by ultrasound criteria. Patent antegrade vertebral flow bilaterally in the cervical region.  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 Electronically Signed   By: Jerilynn Mages.  Shick M.D.   On: 09/27/2017 11:51   Dg Chest Port 1 View  Result Date: 09/26/2017 CLINICAL DATA:  Altered mental status EXAM: PORTABLE CHEST 1 VIEW COMPARISON:  01/18/2017, CT chest 01/18/2017 FINDINGS: Patchy atelectasis or minimal infiltrate at the right base. No  pleural effusion or consolidation. Stable cardiomediastinal silhouette with aortic atherosclerosis. No pneumothorax. IMPRESSION: Minimal patchy opacity at the right base, favor atelectasis. Otherwise no radiographic evidence for acute cardiopulmonary abnormality. Electronically Signed   By: Donavan Foil M.D.   On: 09/26/2017 15:08   Dg Hip Unilat W Or Wo Pelvis 2-3 Views Right  Result Date: 09/18/2017 CLINICAL DATA:  Right groin pain 1 week.  No injury. EXAM: DG HIP (WITH OR WITHOUT PELVIS) 2-3V RIGHT COMPARISON:  05/30/2015 and CT 11/20/2016 FINDINGS: Three screws bridging the left femoral neck into the femoral head intact. Mild symmetric degenerative change of the hips. Mild diffuse osteopenia. No acute fracture or dislocation. Mild degenerate change of the spine. IMPRESSION: No acute findings. Electronically Signed   By: Marin Olp M.D.   On: 09/18/2017 09:57    Orson Eva, DO  [facility] Pager [phone]  If 7PM-7AM, please contact night-coverage [email or web address] Password [facility] 09/28/2017, 11:49 AM   LOS: 0 days

## 2017-09-28 NOTE — Care Management Note (Signed)
Case Management Note  Patient Details  Name: Sonya [name] MRN: [id number] Date of Birth: 1951/05/03  Subjective/Objective:       Admitted with AMS. Pt from home, lives with daughter who works during the day. Pt ind with all ADL'S pta. Daughter says she thinks her mom has beginning stages of dementia. Pt has had HH in the past, daughter interested in referral being made again. Daughter interested in as much support and education about dementia as possible. She says her mom is going to need PT on her hip in the near future.             Action/Plan: DC home in next 24-48 hrs. Referral made to [facility] services, per daughter choice of provider. CM discussed with daughter options for increased care the in home. Pt has owns a home and has $, does not qualify for Medicaid. We discuss long term care policies, applying for long term medicaid once pt requires high level of care, not just more supervision and paying OOP. CM will provide summary of our discussion on DC instructions per daughters request.  Expected Discharge Date:      09/29/2017            Expected Discharge Plan:  [place]  In-House Referral:  NA  Discharge planning Services  CM Consult  Post Acute Care Choice:  Home Health Choice offered to:  Adult Children  HH Arranged:  RN, PT, Nurse's Aide, Speech Therapy, Social Work [employer] Agency:  [place]  Status of Service:  In process, will continue to follow  Sherald Barge, RN 09/28/2017, 2:24 PM

## 2017-09-28 NOTE — Discharge Instructions (Signed)
Options of payor sources for aid services: paying privately or long term insurance plan.  Call Medicare and [place] to see what services are covered under pt's specific plan.   Day services through [facility] day programs  Contact Aging, Transit and Disability Services to see what services they may be able to provide.  Question RN and SW through [facility] about community resources.     Contact [facility] to discuss if they could be of assistance.  See handout provided with DC paperwork.

## 2017-09-28 NOTE — Care Management Obs Status (Signed)
[place] NOTIFICATION   Patient Details  Name: Sonya [name] MRN: [id number] Date of Birth: 07-19-1951   Medicare Observation Status Notification Given:  Yes Patient has AMS. Called daughter 09/26/2017 and 09/27/2017 to explain MOON form. No answer and no option to leave voicemail.  Chantee Cerino, Chauncey Reading, RN 09/28/2017, 7:54 AM

## 2017-09-29 ENCOUNTER — Inpatient Hospital Stay (HOSPITAL_COMMUNITY): Payer: Medicare Other

## 2017-09-29 DIAGNOSIS — N2581 Secondary hyperparathyroidism of renal origin: Secondary | ICD-10-CM

## 2017-09-29 DIAGNOSIS — G9340 Encephalopathy, unspecified: Secondary | ICD-10-CM

## 2017-09-29 DIAGNOSIS — I361 Nonrheumatic tricuspid (valve) insufficiency: Secondary | ICD-10-CM

## 2017-09-29 DIAGNOSIS — Z992 Dependence on renal dialysis: Secondary | ICD-10-CM

## 2017-09-29 DIAGNOSIS — N186 End stage renal disease: Secondary | ICD-10-CM

## 2017-09-29 DIAGNOSIS — E782 Mixed hyperlipidemia: Secondary | ICD-10-CM

## 2017-09-29 DIAGNOSIS — I672 Cerebral atherosclerosis: Secondary | ICD-10-CM

## 2017-09-29 LAB — ECHOCARDIOGRAM COMPLETE
CHL CUP DOP CALC LVOT VTI: 32.7 cm
CHL CUP RV SYS PRESS: 30 mmHg
EERAT: 8.83
EWDT: 430 ms
FS: 35 % (ref 28–44)
Height: 64 in
IVS/LV PW RATIO, ED: 0.99
LA ID, A-P, ES: 23 mm
LA diam end sys: 23 mm
LA diam index: 1.5 cm/m2
LA vol A4C: 45.9 ml
LA vol index: 32.7 mL/m2
LAVOL: 50 mL
LDCA: 3.14 cm2
LV E/e'average: 8.83
LV TDI E'LATERAL: 10.6
LVEEMED: 8.83
LVELAT: 10.6 cm/s
LVOT peak grad rest: 9 mmHg
LVOTD: 20 mm
LVOTPV: 152 cm/s
LVOTSV: 103 mL
MV Dec: 430
MV pk A vel: 128 m/s
MV pk E vel: 93.6 m/s
MVPG: 4 mmHg
PW: 10.1 mm — AB (ref 0.6–1.1)
RV LATERAL S' VELOCITY: 17.2 cm/s
RV TAPSE: 18.1 mm
Reg peak vel: 259 cm/s
TDI e' medial: 6.64
TR max vel: 259 cm/s
Weight: 1834.23 oz

## 2017-09-29 LAB — LIPID PANEL
CHOL/HDL RATIO: 4 ratio
Cholesterol: 148 mg/dL (ref 0–200)
HDL: 37 mg/dL — AB (ref >40)
LDL Cholesterol: 93 mg/dL (ref 0–99)
TRIGLYCERIDES: 90 mg/dL (ref <150)
VLDL: 18 mg/dL (ref 0–40)

## 2017-09-29 MED ORDER — ALPRAZOLAM 0.5 MG PO TABS: 0.5000 mg | ORAL_TABLET | Freq: Two times a day (BID) | ORAL | 0 refills | Status: DC | PRN

## 2017-09-29 MED ORDER — ASPIRIN 81 MG PO TBEC: 81.0000 mg | DELAYED_RELEASE_TABLET | Freq: Every day | ORAL | 1 refills | Status: AC

## 2017-09-29 MED ORDER — ALLOPURINOL 300 MG PO TABS: 150.0000 mg | ORAL_TABLET | Freq: Every day | ORAL | 0 refills | Status: DC

## 2017-09-29 MED ORDER — NIFEDIPINE ER 30 MG PO TB24: 30.0000 mg | ORAL_TABLET | Freq: Every day | ORAL | 0 refills | Status: DC

## 2017-09-29 MED ORDER — K PHOS MONO-SOD PHOS DI & MONO 155-852-130 MG PO TABS: 500.0000 mg | ORAL_TABLET | Freq: Two times a day (BID) | ORAL | 0 refills | Status: DC

## 2017-09-29 MED ORDER — NIFEDIPINE ER OSMOTIC RELEASE 30 MG PO TB24
30.0000 mg | ORAL_TABLET | Freq: Every day | ORAL | Status: DC
  Administered 2017-09-29 – 2017-10-01 (×3): 30 mg via ORAL
  Filled 2017-09-29 (×3): qty 1

## 2017-09-29 MED ORDER — ATORVASTATIN CALCIUM 20 MG PO TABS: 20.0000 mg | ORAL_TABLET | Freq: Every day | ORAL | 1 refills | Status: DC

## 2017-09-29 MED ORDER — ATORVASTATIN CALCIUM 20 MG PO TABS
20.0000 mg | ORAL_TABLET | Freq: Every day | ORAL | Status: DC
  Administered 2017-09-29 – 2017-09-30 (×2): 20 mg via ORAL
  Filled 2017-09-29 (×2): qty 1

## 2017-09-29 MED ORDER — K PHOS MONO-SOD PHOS DI & MONO 155-852-130 MG PO TABS
500.0000 mg | ORAL_TABLET | Freq: Two times a day (BID) | ORAL | Status: AC
  Administered 2017-09-29: 500 mg via ORAL
  Filled 2017-09-29: qty 2

## 2017-09-29 NOTE — Progress Notes (Signed)
*  PRELIMINARY RESULTS* Echocardiogram 2D Echocardiogram has been performed.  Samuel Germany 09/29/2017, 11:22 AM

## 2017-09-29 NOTE — Care Management Important Message (Signed)
Important Message  Patient Details  Name: Sonya [name] MRN: [id number] Date of Birth: 1950-08-13   Medicare Important Message Given:  Yes    Sherald Barge, RN 09/29/2017, 2:27 PM

## 2017-09-29 NOTE — Progress Notes (Signed)
Physical therapist did sitting and standing blood pressures and pulses. Patient noted to be orthostatic and complained of feeling dizzy to the therapist. Dr. Carles Collet notified by Probation officer.

## 2017-09-29 NOTE — Progress Notes (Signed)
Patient's family called RN to room.  Family upset about patient being discharged this afternoon.  Family concerned about patient being discharged home since SNF was recommended by PT.  Dr. Carles Collet notified.  Case management notified.

## 2017-09-29 NOTE — Discharge Summary (Addendum)
Physician Discharge Summary  Sonya [name] VWU:[id number] DOB: 05-17-1951 DOA: 09/26/2017  PCP: Redmond School, MD  Admit date: 09/26/2017 Discharge date: 09/29/2017   ADDENDUM--PATIENT NOT DISCHARGED ON 09/29/17.  DISCHARGE DELAYED DUE TO MEDICARE APPEAL  Admitted From: Home Disposition:  Home  Recommendations for Outpatient Follow-up:  1. Follow up with PCP in 1-2 weeks 2. Please obtain BMP/CBC in one week   Home Health: yes Equipment/Devices:HHPT, ST, Aide  Discharge Condition: Stable CODE STATUS: FULL Diet recommendation: Renal diet   Brief/Interim Summary: 67 y.o.femalewith medical history significant of anemia, anxiety, ESRD on hemodialysis Tuesday/Thursday/Saturday, diverticulosis/diverticulitis, history of dysphagia, history of fracture of left femoral neck, GERD, hypertension/history of hypertensive urgency, hyperparathyroidism due to renal insufficiency, hypothyroidism, history of intracerebral hemorrhage, meningocele, protein calorie malnutrition, stroke, history of uterine cancer, history of vertical diplopiaand vertigo; who presented to ED with complaints of AMS, confusion and slurred speech. These symptoms in the presence of hypoglycemia. Admitted to complete work up for CVA (found in her MRI exam) and AMS.  Although the patient did have some sundowning, her mental status overall improved gradually throughout the hospitalization.  She was near her baseline at the time of discharge.    Discharge Diagnoses:  Acute ischemic stroke -PT/OT evaluation-->SNF -Unfortunately, due to insurance issue, family would have to pay out-of-pocket  -set up Sonya [name], Sonya [name], Sonya [name] aide, Sonya [name] SW -Speech therapy eval -CT brain--no acute abnormality -MRI brain--punctate focus of ischemia inferior medial right temporal lobe -MRA brain--basilar artery patent with unchanged severe proximal narrowing.  Distal right vertebral artery narrowing/occlusion -Carotid Duplex--negative for hemodynamically  significant stenosis -Echo--EF 65-70%, no PFO, mild TR, G1DD, no WMA -LDL--93 -HbA1C-5.5 -Antiplatelet--aspirin 81 mg daily  Acute metabolic encephalopathy -Multifactorial including acute stroke, low normal B12 level, hypoglycemia, and hospital delirium -The patient remains intermittently confused and agitated requiring IV Ativan during the hospitalization -Daughter at bedside relates intermittent episodes of confusion at baseline--pt near baseline at time of d/c -Suspect patient has underlying cognitive impairment/dementia -Check ammonia--25 -Serum B12 289  -TSH-0.767  -supplement B12 -refer to neuropsychiatry with [place] [facility] after d/c  hypoglycemia -Due to poor po intake -Improved  ESRD -Appreciate nephrology consult -Received HD 09/27/2017 and 09/29/17  Secondary hyperparathyroidism -DisContinue Sensipar per renal due to low phosphorus -Management per nephrology  Essential hypertension -Continue nifedipine --decrease dose to 30 mg daily due to orthostasis during the hospitalization -BP controlled  Anemia of CKD -Baseline hemoglobin 11-12 -Hemoglobin at baseline  Hyperlipidemia -Continue statin--increase atorvastatin to 20 mg daily  Hypophosphatemia -replete -stop sensipar per renal     Discharge Instructions  Discharge Instructions    Ambulatory referral to Neurology   Complete by:  As directed    Refer to [facility]--please schedule appointment for patient to see Dr. Marcos [name] for neuropsychiatric testing   Diet - low sodium heart healthy   Complete by:  As directed    Increase activity slowly   Complete by:  As directed      Allergies as of 09/29/2017      Reactions   Cephalexin Itching      Medication List    STOP taking these medications   cinacalcet 30 MG tablet Commonly known as:  SENSIPAR     TAKE these medications   allopurinol 300 MG tablet Commonly known as:  ZYLOPRIM Take 0.5 tablets (150 mg total) by mouth daily. What  changed:  additional instructions   ALPRAZolam 0.5 MG tablet Commonly known as:  XANAX Take 1 tablet (0.5 mg total) by mouth 2 (two) times  daily as needed for anxiety. What changed:    when to take this  reasons to take this   aspirin 81 MG EC tablet Take 1 tablet (81 mg total) by mouth daily.   atorvastatin 20 MG tablet Commonly known as:  LIPITOR Take 1 tablet (20 mg total) by mouth daily. What changed:    medication strength  how much to take   folic acid 1 MG tablet Commonly known as:  FOLVITE Take 2 mg by mouth daily.   levothyroxine 50 MCG tablet Commonly known as:  SYNTHROID, LEVOTHROID Take 1 tablet (50 mcg total) by mouth daily. What changed:  when to take this   lidocaine-prilocaine cream Commonly known as:  EMLA Apply 1 application topically every Tuesday, Thursday, and Saturday at 6 PM.   NIFEdipine 30 MG 24 hr tablet Commonly known as:  PROCARDIA-XL/ADALAT CC Take 1 tablet (30 mg total) by mouth at bedtime. What changed:    medication strength  how much to take   pantoprazole 40 MG tablet Commonly known as:  PROTONIX Take 1 tablet (40 mg total) by mouth 2 (two) times daily before a meal.   phosphorus 155-852-130 MG tablet Commonly known as:  K PHOS NEUTRAL Take 2 tablets (500 mg total) by mouth 2 (two) times daily.   THERATEARS 0.25 % Soln Generic drug:  Carboxymethylcellulose Sodium Apply 1-2 drops to eye daily as needed (for eye irritation).   Vitamin D (Ergocalciferol) 50000 units Caps capsule Commonly known as:  DRISDOL Take 50,000 Units by mouth every 7 (seven) days.      Follow-up Information    [facility].   Specialty:  Home Health Services Contact information: [place] [phone]        [facility] Follow up.   Why:  Case manager has sent referral - may call after DC to follow up Contact information: [phone] [facility]           Allergies  Allergen Reactions  . Cephalexin Itching    Consultations:  renal   Procedures/Studies: Ct Head Wo Contrast  Result Date: 09/26/2017 CLINICAL DATA:  Altered mental status, recent fall EXAM: CT HEAD WITHOUT CONTRAST TECHNIQUE: Contiguous axial images were obtained from the base of the skull through the vertex without intravenous contrast. COMPARISON:  01/18/2017 FINDINGS: Brain: Similar age related brain atrophy and extensive chronic white matter microvascular ischemic changes throughout both cerebral hemispheres. No acute intracranial hemorrhage, mass lesion infarction midline shift, herniation, hydrocephalus, or extra-axial fluid collection. No focal mass effect or edema. Cisterns are patent. No cerebellar. Vascular: Intracranial atherosclerosis noted.  Hyperdense vessel. Skull: Intact skull.  Negative for fracture. Sinuses/Orbits: Symmetric orbits. 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 Other: None. IMPRESSION: Stable head CT without contrast. No acute intracranial abnormality or significant change. 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 Electronically Signed   By: Jerilynn Mages.  Shick M.D.   On: 09/26/2017 16:20   Mr [name] Nam Head Wo Contrast  Result Date: 09/26/2017 CLINICAL DATA:  Altered mental status and weakness for 2 days EXAM: MRI HEAD WITHOUT CONTRAST MRA HEAD WITHOUT CONTRAST TECHNIQUE: Multiplanar, multiecho pulse sequences of the brain and surrounding structures were obtained without intravenous contrast. Angiographic images of the head were obtained using MRA technique without contrast. COMPARISON:  Head CT 09/26/2017 Brain MRI 11/19/2016 FINDINGS: MRI HEAD FINDINGS Brain: The midline structures are normal. Punctate focus of  abnormal diffusion restriction within the medial right temporal lobe (series 4, image 68). Multifocal periventricular white matter  hyperintensity, most often a result of chronic microvascular ischemia. No mass lesion. No chronic microhemorrhage or cerebral amyloid angiopathy. No hydrocephalus, age advanced atrophy or lobar predominant volume loss. There are scattered foci of chronic microhemorrhage within the brainstem, cerebellum and bilateral basal ganglia. Skull and upper cervical spine: The visualized skull base, calvarium, upper cervical spine and extracranial soft tissues are normal. Sinuses/Orbits: Meningoencephalocele extending into the left eye vision of the sphenoid sinus, unchanged. Normal orbits. MRA HEAD FINDINGS Intracranial internal carotid arteries: Normal. Anterior cerebral arteries: Normal. Middle cerebral arteries: Mild atherosclerotic irregularity. Posterior communicating arteries: Present on the right. Posterior cerebral arteries: Normal. Basilar artery: Moderate narrowing of the basilar artery, worse proximally, unchanged. Vertebral arteries: The right vertebral artery is occluded proximal to the PICA origin. There is reconstitution of the distal V4 segment. Superior cerebellar arteries: Normal. Anterior inferior cerebellar arteries: Normal. Posterior inferior cerebellar arteries: Visualized on the left only. IMPRESSION: 1. Punctate focus of acute ischemia within the inferomedial right temporal lobe. No associated hemorrhage. 2. 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 3. Chronic ischemic microangiopathy. 4. Unchanged left sphenoid meningoencephalocele. Electronically Signed   By: Ulyses Jarred M.D.   On: 09/26/2017 20:28   Mr Brain Wo Contrast (neuro Protocol)  Result Date: 09/26/2017 CLINICAL DATA:  Altered mental status and weakness for 2 days EXAM: MRI HEAD WITHOUT  CONTRAST MRA HEAD WITHOUT CONTRAST TECHNIQUE: Multiplanar, multiecho pulse sequences of the brain and surrounding structures were obtained without intravenous contrast. Angiographic images of the head were obtained using MRA technique without contrast. COMPARISON:  Head CT 09/26/2017 Brain MRI 11/19/2016 FINDINGS: MRI HEAD FINDINGS Brain: The midline structures are normal. Punctate focus of abnormal diffusion restriction within the medial right temporal lobe (series 4, image 68). Multifocal periventricular white matter hyperintensity, most often a result of chronic microvascular ischemia. No mass lesion. No chronic microhemorrhage or cerebral amyloid angiopathy. No hydrocephalus, age advanced atrophy or lobar predominant volume loss. There are scattered foci of chronic microhemorrhage within the brainstem, cerebellum and bilateral basal ganglia. Skull and upper cervical spine: The visualized skull base, calvarium, upper cervical spine and extracranial soft tissues are normal. Sinuses/Orbits: Meningoencephalocele extending into the left eye vision of the sphenoid sinus, unchanged. Normal orbits. MRA HEAD FINDINGS Intracranial internal carotid arteries: Normal. Anterior cerebral arteries: Normal. Middle cerebral arteries: Mild atherosclerotic irregularity. Posterior communicating arteries: Present on the right. Posterior cerebral arteries: Normal. Basilar artery: Moderate narrowing of the basilar artery, worse proximally, unchanged. Vertebral arteries: The right vertebral artery is occluded proximal to the PICA origin. There is reconstitution of the distal V4 segment. Superior cerebellar arteries: Normal. Anterior inferior cerebellar arteries: Normal. Posterior inferior cerebellar arteries: Visualized on the left only. IMPRESSION: 1. Punctate focus of acute ischemia within the inferomedial right temporal lobe. No associated hemorrhage. 2. 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 3. Chronic ischemic microangiopathy. 4. Unchanged left sphenoid meningoencephalocele. Electronically Signed   By: Ulyses Jarred M.D.   On:  09/26/2017 20:28   US Carotid Bilateral  Result Date: 09/27/2017 CLINICAL DATA:  Altered mental status, stroke symptoms EXAM: BILATERAL CAROTID DUPLEX ULTRASOUND TECHNIQUE: Pearline Cables scale imaging, color Doppler and duplex ultrasound were performed of bilateral carotid and vertebral arteries in the neck. COMPARISON:  09/26/2017 FINDINGS: Criteria: Quantification of carotid stenosis is based on velocity parameters that correlate the residual internal carotid diameter with NASCET-based stenosis levels, using the diameter of the distal internal carotid lumen as the denominator for stenosis measurement. The following velocity measurements were obtained: RIGHT ICA:  94/32 cm/sec CCA:  16/1 cm/sec SYSTOLIC ICA/CCA RATIO:  0.96 DIASTOLIC ICA/CCA RATIO:  4.9 ECA:  107 cm/sec LEFT ICA:  71/21 cm/sec CCA:  045/40 cm/sec SYSTOLIC ICA/CCA RATIO:  0.7 DIASTOLIC ICA/CCA RATIO:  9.81 ECA:  108 cm/sec RIGHT CAROTID ARTERY: Minor echogenic shadowing plaque formation. No hemodynamically significant right ICA stenosis, velocity elevation, or turbulent flow. Degree of narrowing less than 50%. RIGHT VERTEBRAL ARTERY: Antegrade in the cervical region. Please refer to the MRA report. LEFT CAROTID ARTERY: Similar scattered minor echogenic plaque formation. No hemodynamically significant left ICA stenosis, velocity elevation, or turbulent flow. LEFT VERTEBRAL ARTERY:  Antegrade in the cervical region. IMPRESSION: Minor carotid atherosclerosis. No hemodynamically significant ICA stenosis. Degree of narrowing less than 50% bilaterally by ultrasound criteria. Patent antegrade vertebral flow  bilaterally in the cervical region. 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 Electronically Signed   By: Jerilynn Mages.  Shick M.D.   On: 09/27/2017 11:51   Dg Chest Port 1 View  Result Date: 09/26/2017 CLINICAL DATA:  Altered mental status EXAM: PORTABLE CHEST 1 VIEW COMPARISON:  01/18/2017, CT chest 01/18/2017 FINDINGS: Patchy atelectasis or minimal infiltrate at the right base. No pleural effusion or consolidation. Stable cardiomediastinal silhouette with aortic atherosclerosis. No pneumothorax. IMPRESSION: Minimal patchy opacity at the right base, favor atelectasis. Otherwise no radiographic evidence for acute cardiopulmonary abnormality. Electronically Signed   By: Donavan Foil M.D.   On: 09/26/2017 15:08   Dg Hip Unilat W Or Wo Pelvis 2-3 Views Right  Result Date: 09/18/2017 CLINICAL DATA:  Right groin pain 1 week.  No injury. EXAM: DG HIP (WITH OR WITHOUT PELVIS) 2-3V RIGHT COMPARISON:  05/30/2015 and CT 11/20/2016 FINDINGS: Three screws bridging the left femoral neck into the femoral head intact. Mild symmetric degenerative change of the hips. Mild diffuse osteopenia. No acute fracture or dislocation. Mild degenerate change of the spine. IMPRESSION: No acute findings. Electronically Signed   By: Marin Olp M.D.   On: 09/18/2017 09:57        Discharge Exam: Vitals:   09/29/17 1600 09/29/17 1630  BP: 107/60 (!) 103/59  Pulse: 96 95  Resp:    Temp:    SpO2:     Vitals:   09/29/17 1500 09/29/17 1530 09/29/17 1600 09/29/17 1630  BP: 123/70 117/67 107/60 (!) 103/59  Pulse: 94 97 96 95  Resp:      Temp:      TempSrc:      SpO2:      Weight:      Height:        General: Pt is alert, awake, not in acute distress Cardiovascular: RRR, S1/S2 +, no rubs, no gallops Respiratory: CTA bilaterally, no wheezing, no rhonchi Abdominal: Soft, NT, ND, bowel sounds + Extremities: no edema, no cyanosis   The results of  significant diagnostics from this hospitalization (including imaging, microbiology, ancillary and laboratory) are listed below for reference.  Significant Diagnostic Studies: Ct Head Wo Contrast  Result Date: 09/26/2017 CLINICAL DATA:  Altered mental status, recent fall EXAM: CT HEAD WITHOUT CONTRAST TECHNIQUE: Contiguous axial images were obtained from the base of the skull through the vertex without intravenous contrast. COMPARISON:  01/18/2017 FINDINGS: Brain: Similar age related brain atrophy and extensive chronic white matter microvascular ischemic changes throughout both cerebral hemispheres. No acute intracranial hemorrhage, mass lesion infarction midline shift, herniation, hydrocephalus, or extra-axial fluid collection. No focal mass effect or edema. Cisterns are patent. No cerebellar. Vascular: Intracranial atherosclerosis noted.  Hyperdense vessel. Skull: Intact skull.  Negative for fracture. Sinuses/Orbits: Symmetric orbits. 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 Other: None. IMPRESSION: Stable head CT without contrast. No acute intracranial abnormality or significant change. 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 Electronically Signed   By: Jerilynn Mages.  Shick M.D.   On: 09/26/2017 16:20   Mr [name] Nam Head Wo Contrast  Result Date: 09/26/2017 CLINICAL DATA:  Altered mental status and weakness for 2 days EXAM: MRI HEAD WITHOUT CONTRAST MRA HEAD WITHOUT CONTRAST TECHNIQUE: Multiplanar, multiecho pulse sequences of the brain and surrounding structures were obtained without intravenous contrast. Angiographic images of the head were obtained using MRA technique without contrast. COMPARISON:  Head CT 09/26/2017 Brain MRI 11/19/2016 FINDINGS: MRI HEAD FINDINGS Brain: The midline structures are normal. Punctate focus of abnormal diffusion restriction within the medial right temporal lobe  (series 4, image 68). Multifocal periventricular white matter hyperintensity, most often a result of chronic microvascular ischemia. No mass lesion. No chronic microhemorrhage or cerebral amyloid angiopathy. No hydrocephalus, age advanced atrophy or lobar predominant volume loss. There are scattered foci of chronic microhemorrhage within the brainstem, cerebellum and bilateral basal ganglia. Skull and upper cervical spine: The visualized skull base, calvarium, upper cervical spine and extracranial soft tissues are normal. Sinuses/Orbits: Meningoencephalocele extending into the left eye vision of the sphenoid sinus, unchanged. Normal orbits. MRA HEAD FINDINGS Intracranial internal carotid arteries: Normal. Anterior cerebral arteries: Normal. Middle cerebral arteries: Mild atherosclerotic irregularity. Posterior communicating arteries: Present on the right. Posterior cerebral arteries: Normal. Basilar artery: Moderate narrowing of the basilar artery, worse proximally, unchanged. Vertebral arteries: The right vertebral artery is occluded proximal to the PICA origin. There is reconstitution of the distal V4 segment. Superior cerebellar arteries: Normal. Anterior inferior cerebellar arteries: Normal. Posterior inferior cerebellar arteries: Visualized on the left only. IMPRESSION: 1. Punctate focus of acute ischemia within the inferomedial right temporal lobe. No associated hemorrhage. 2. 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 3. Chronic ischemic microangiopathy. 4. Unchanged left sphenoid meningoencephalocele. Electronically Signed   By: Ulyses Jarred M.D.   On: 09/26/2017 20:28   Mr Brain Wo Contrast (neuro Protocol)  Result Date: 09/26/2017 CLINICAL DATA:  Altered  mental status and weakness for 2 days EXAM: MRI HEAD WITHOUT CONTRAST MRA HEAD WITHOUT CONTRAST TECHNIQUE: Multiplanar, multiecho pulse sequences of the brain and surrounding structures were obtained without intravenous contrast. Angiographic images of the head were obtained using MRA technique without contrast. COMPARISON:  Head CT 09/26/2017 Brain MRI 11/19/2016 FINDINGS: MRI HEAD FINDINGS Brain: The midline structures are normal. Punctate focus of abnormal diffusion restriction within the medial right temporal lobe (series 4, image 68). Multifocal periventricular white  matter hyperintensity, most often a result of chronic microvascular ischemia. No mass lesion. No chronic microhemorrhage or cerebral amyloid angiopathy. No hydrocephalus, age advanced atrophy or lobar predominant volume loss. There are scattered foci of chronic microhemorrhage within the brainstem, cerebellum and bilateral basal ganglia. Skull and upper cervical spine: The visualized skull base, calvarium, upper cervical spine and extracranial soft tissues are normal. Sinuses/Orbits: Meningoencephalocele extending into the left eye vision of the sphenoid sinus, unchanged. Normal orbits. MRA HEAD FINDINGS Intracranial internal carotid arteries: Normal. Anterior cerebral arteries: Normal. Middle cerebral arteries: Mild atherosclerotic irregularity. Posterior communicating arteries: Present on the right. Posterior cerebral arteries: Normal. Basilar artery: Moderate narrowing of the basilar artery, worse proximally, unchanged. Vertebral arteries: The right vertebral artery is occluded proximal to the PICA origin. There is reconstitution of the distal V4 segment. Superior cerebellar arteries: Normal. Anterior inferior cerebellar arteries: Normal. Posterior inferior cerebellar arteries: Visualized on the left only. IMPRESSION: 1. Punctate focus of acute ischemia within the inferomedial right temporal lobe. No associated hemorrhage. 2. Occlusion of the  distal right vertebral artery proximal to the PICA origin, new compared to 11/19/2016, with reconstitution at the most distal aspect of V4. 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 3. Chronic ischemic microangiopathy. 4. Unchanged left sphenoid meningoencephalocele. Electronically Signed   By: Ulyses Jarred M.D.   On: 09/26/2017 20:28   US Carotid Bilateral  Result Date: 09/27/2017 CLINICAL DATA:  Altered mental status, stroke symptoms EXAM: BILATERAL CAROTID DUPLEX ULTRASOUND TECHNIQUE: Pearline Cables scale imaging, color Doppler and duplex ultrasound were performed of bilateral carotid and vertebral arteries in the neck. COMPARISON:  09/26/2017 FINDINGS: Criteria: Quantification of carotid stenosis is based on velocity parameters that correlate the residual internal carotid diameter with NASCET-based stenosis levels, using the diameter of the distal internal carotid lumen as the denominator for stenosis measurement. The following velocity measurements were obtained: RIGHT ICA:  94/32 cm/sec CCA:  73/2 cm/sec SYSTOLIC ICA/CCA RATIO:  2.02 DIASTOLIC ICA/CCA RATIO:  4.9 ECA:  107 cm/sec LEFT ICA:  71/21 cm/sec CCA:  542/70 cm/sec SYSTOLIC ICA/CCA RATIO:  0.7 DIASTOLIC ICA/CCA RATIO:  6.23 ECA:  108 cm/sec RIGHT CAROTID ARTERY: Minor echogenic shadowing plaque formation. No hemodynamically significant right ICA stenosis, velocity elevation, or turbulent flow. Degree of narrowing less than 50%. RIGHT VERTEBRAL ARTERY: Antegrade in the cervical region. Please refer to the MRA report. LEFT CAROTID ARTERY: Similar scattered minor echogenic plaque formation. No hemodynamically significant left ICA stenosis, velocity elevation, or turbulent flow. LEFT VERTEBRAL ARTERY:  Antegrade in the cervical region. IMPRESSION: Minor carotid atherosclerosis. No hemodynamically significant ICA stenosis. Degree of narrowing less than 50% bilaterally  by ultrasound criteria. Patent antegrade vertebral flow bilaterally in the cervical region. 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 Electronically Signed   By: Jerilynn Mages.  Shick M.D.   On: 09/27/2017 11:51   Dg Chest Port 1 View  Result Date: 09/26/2017 CLINICAL DATA:  Altered mental status EXAM: PORTABLE CHEST 1 VIEW COMPARISON:  01/18/2017, CT chest 01/18/2017 FINDINGS: Patchy atelectasis or minimal infiltrate at the right base. No pleural effusion or consolidation. Stable cardiomediastinal silhouette with aortic atherosclerosis. No pneumothorax. IMPRESSION: Minimal patchy opacity at the right base, favor atelectasis. Otherwise no radiographic evidence for acute cardiopulmonary abnormality. Electronically Signed   By: Donavan Foil M.D.   On: 09/26/2017 15:08   Dg Hip Unilat W Or Wo Pelvis 2-3 Views Right  Result Date: 09/18/2017 CLINICAL DATA:  Right groin pain 1 week.  No injury. EXAM: DG HIP (WITH OR WITHOUT PELVIS) 2-3V RIGHT COMPARISON:  05/30/2015 and CT 11/20/2016 FINDINGS: Three screws bridging the left femoral neck into the femoral head intact. Mild symmetric degenerative change of the hips. Mild diffuse osteopenia. No acute fracture or dislocation. Mild degenerate change of the spine. IMPRESSION: No acute findings. Electronically Signed   By: Marin Olp M.D.   On: 09/18/2017 09:57     Microbiology: Recent Results (from the past 240 hour(s))  Culture, blood (Routine X 2) w Reflex to ID Panel     Status: None (Preliminary result)   Collection Time: 09/26/17  3:47 PM  Result Value Ref Range Status   Specimen Description BLOOD LEFT WRIST  Final   Special Requests   Final    BOTTLES DRAWN AEROBIC ONLY Blood Culture adequate volume   Culture   Final    NO GROWTH 3 DAYS Performed at [facility], [address], [place], [place] [address]    Report Status PENDING  Incomplete  Culture, blood (Routine X 2) w Reflex to ID  Panel     Status: None (Preliminary result)   Collection Time: 09/26/17  3:48 PM  Result Value Ref Range Status   Specimen Description BLOOD LEFT HAND  Final   Special Requests   Final    BOTTLES DRAWN AEROBIC ONLY Blood Culture adequate volume   Culture   Final    NO GROWTH 3 DAYS Performed at [facility], [address] [place], [place] [address]    Report Status PENDING  Incomplete  MRSA PCR Screening     Status: None   Collection Time: 09/27/17  4:25 PM  Result Value Ref Range Status   MRSA by PCR NEGATIVE NEGATIVE Final    Comment:        The GeneXpert MRSA Assay (FDA approved for NASAL specimens only), is one component of a comprehensive MRSA colonization surveillance program. It is not intended to diagnose MRSA infection nor to guide or monitor treatment for MRSA infections. Performed at [facility], [address] [place], [place] [address]      Labs: Basic Metabolic Panel: Recent Labs  Lab 09/26/17 1634 09/27/17 0652 09/27/17 1045 09/28/17 0625  NA 136 136 135 134*  K 3.6 3.6 3.7 3.4*  CL 96* 97* 97* 93*  CO2 21* 24 23 28   GLUCOSE 122* 114* 139* 128*  BUN 50* 53* 54* 19  CREATININE 7.20* 7.60* 7.97* 4.11*  CALCIUM 9.2 8.8* 8.6* 8.4*  PHOS  --   --  2.1* 1.9*   Liver Function Tests: Recent Labs  Lab 09/26/17 1634 09/27/17 1045 09/28/17 0625  AST 23  --   --   ALT 8*  --   --   ALKPHOS 76  --   --   BILITOT 0.6  --   --   PROT 8.1  --   --   ALBUMIN 3.2* 2.9* 3.0*   Recent Labs  Lab 09/26/17 1634  LIPASE 52*   Recent Labs  Lab 09/28/17 1301  AMMONIA 25   CBC: Recent Labs  Lab 09/26/17 1547 09/27/17 0652 09/27/17 1045 09/28/17 0622  WBC 11.7* 5.5 5.0 4.7  NEUTROABS 9.5*  --   --   --   HGB 11.8* 10.6* 10.5* 11.2*  HCT 37.2 32.0* 32.4* 34.5*  MCV 74.5* 72.9* 72.5* 72.8*  PLT 253 237 214 230   Cardiac Enzymes: Recent Labs  Lab  09/26/17 1634  TROPONINI <0.03   BNP: Invalid input(s): POCBNP CBG: Recent Labs  Lab  09/26/17 2057 09/26/17 2149 09/27/17 0628 09/27/17 1037 09/27/17 1610  GLUCAP 73 92 83 101* 83    Time coordinating discharge:  Greater than 30 minutes  Signed:  Orson Eva, DO [facility] Pager: [phone] 09/29/2017, 5:03 PM

## 2017-09-29 NOTE — Procedures (Signed)
    HEMODIALYSIS TREATMENT NOTE:  3 hour 25 minute dialysis session completed via right upper arm AVG (15g/antegrade). Goal met: 2 liters removed without interruption in ultrafiltration.  Treatment was discontinued 5 minutes early due to an episode of symptomatic hypotension including non-responsiveness and blank stare.  Symptoms abated as all blood was reinfused.  Pt was alert and responding appropriately with BP 137/59 after blood return.  AVG needles were removed and hemostasis was achieved within 10 minutes.    Family/friends expressed great concern about pt being discharged home even with [facility] services.  They state that, when home alone, pt will "sit in one place because she's afraid to move [and fall]."  They wish to speak with a pt advocate, SW, or CM about options.  Concerns relayed to Gershon Cull, RN.   Rockwell Alexandria, RN, CDN

## 2017-09-29 NOTE — Care Management Note (Signed)
Case Management Note  Patient Details  Name: Sonya [name] MRN: [id number] Date of Birth: [birth year]  Expected Discharge Date:    09/29/17              Expected Discharge Plan:  [place]  In-House Referral:  NA  Discharge planning Services  CM Consult  Post Acute Care Choice:  Home Health Choice offered to:  Adult Children  HH Arranged:  RN, PT, Nurse's Aide, Speech Therapy, Social Work [employer] Agency:  [facility]  Status of Service:  Completed, signed off  If discussed at [employer], dates discussed:    Additional Comments: Anticipate DC home today after HD. PT has recommended SNF. Pt does not have 3 night stay - this was explained to daughter, unable to pay OOP. Daughter appears very overwhelmed with her mothers care at this time. She works two jobs, is unable to be home with her during the day. CM has made referral to [facility] for PACE services. CM has made f/u call and left VM. [facility] rep, aware of DC home today and will pull pt info from chart.   Sherald Barge, RN 09/29/2017, 2:22 PM

## 2017-09-29 NOTE — Evaluation (Signed)
Clinical/Bedside Swallow Evaluation Patient Details  Name: Sonya [name] MRN: [id number] Date of Birth: Oct 20, 1950  Today's Date: 09/29/2017 Time: SLP Start Time (ACUTE ONLY): 9485 SLP Stop Time (ACUTE ONLY): 1836 SLP Time Calculation (min) (ACUTE ONLY): 23 min  Past Medical History:  Past Medical History:  Diagnosis Date  . Anemia   . Anxiety   . CKD (chronic kidney disease)   . Diverticulitis   . Dysphagia   . ESRD (end stage renal disease) ([place])    On HD  . Fracture of femoral neck, left, closed ([place]) 05/30/2015  . Gastritis 06/2013 & 05/2015  . Gastroesophageal reflux disease with stricture 05/2015  . GERD (gastroesophageal reflux disease)   . HTN (hypertension)   . Hyperparathyroidism due to renal insufficiency ([place])   . Hypertensive urgency 06/2013 & 04/2015  . Hypothyroidism   . ICH (intracerebral hemorrhage) ([place]) 10/2014  . 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 Protein calorie malnutrition ([place])   . Stroke ([place])   . Uterine cancer ([place]) 2012  . Vertical diplopia   . Vertigo    Past Surgical History:  Past Surgical History:  Procedure Laterality Date  . ABDOMINAL HYSTERECTOMY    . APPENDECTOMY    . BASCILIC VEIN TRANSPOSITION Right 09/17/2013   Procedure: BRACHIAL VEIN TRANSPOSITION;  Surgeon: Conrad Mauckport, MD;  Location: [facility];  Service: Vascular;  Laterality: Right;  . BASCILIC VEIN TRANSPOSITION Right 10/29/2013   Procedure: RIGHT SECOND STAGE BRACHIAL VEIN TRANSPOSITION;  Surgeon: Conrad No Name, MD;  Location: [place];  Service: Vascular;  Laterality: Right;  . CESAREAN SECTION    . CHOLECYSTECTOMY    . COLONOSCOPY  2000   TICS, IH  . COLONOSCOPY  2003 NUR BRBPR D50 V6   DC/[place] TICS, IH  . COLONOSCOPY N/A 09/04/2013   SLF: Small ulcer in the descending colon, one colon polyp (tubular adenoma), large internal hemorrhoids, moderate diverticulosis. next tcs 10 years.  . ESOPHAGOGASTRODUODENOSCOPY N/A 05/23/2015   IOE:VOJJ non-erosive gastritis/stricture at the gastroesophageal  junction  . ESOPHAGOGASTRODUODENOSCOPY (EGD) WITH ESOPHAGEAL DILATION N/A 06/08/2013   Dr. Fields:Stricture at the gastroesophageal junction/MILD NON-erosive gastritis (inflammation) was found in the gastric antrum; multiple biopsies/NO SOURCE FOR ANEMIA IDETIFIED-MOST LIKELY DUE TO ANEMIA OF CHRONIC DISEASE  . FLEXIBLE SIGMOIDOSCOPY N/A 01/02/2016   Dr. Oneida Alar: external hemorrhoids, diverticulosis, internal hemorrhoids, proximal rectum normal  . GIVENS CAPSULE STUDY N/A 12/03/2013   SLF: occasional gastric erosion. small bowel normal  . HIP PINNING,CANNULATED Left 05/31/2015   Procedure: CANNULATED HIP PINNING;  Surgeon: Leandrew Koyanagi, MD;  Location: [facility];  Service: Orthopedics;  Laterality: Left;  . INSERTION OF DIALYSIS CATHETER Left 09/17/2013   Procedure: INSERTION OF DIALYSIS CATHETER;  Surgeon: Conrad Eden, MD;  Location: [place];  Service: Vascular;  Laterality: Left;  . LAPAROSCOPIC TOTAL HYSTERECTOMY    . NASAL SEPTUM SURGERY    . SAVORY DILATION N/A 05/23/2015   Procedure: SAVORY DILATION;  Surgeon: Danie Binder, MD;  [facility];  Service: Endoscopy;  Laterality: N/A;  . THROMBECTOMY AND REVISION OF ARTERIOVENTOUS (AV) GORETEX  GRAFT Right 07/06/2017   Procedure: THROMBECTOMY AND REVISION OF ARTERIOVENOUS (AV) GORETEX  GRAFT RIGHT UPPER ARM;  Surgeon: Rosetta Posner, MD;  Location[facility];  Service: Vascular;  Laterality: Right;   HPI:  67 y.o. female with medical history significant of anemia, anxiety, ESRD on hemodialysis Tuesday/Thursday/Saturday, diverticulosis/diverticulitis, history of dysphagia, history of fracture of left femoral neck, GERD, hypertension/history of hypertensive urgency, hyperparathyroidism due to  renal insufficiency, hypothyroidism, history of intracerebral hemorrhage, meningocele, protein calorie malnutrition, stroke, history of uterine cancer, history of vertical diplopia and vertigo; who presented to ED with complaints of AMS, confusion and slurred  speech. These symptoms in the presence of hypoglycemia. Admitted to complete work up for CVA (found in her MRI exam) and AMS.  Although the patient did have some sundowning, her mental status overall improved gradually throughout the hospitalization.  She was near her baseline at the time of discharge.   Assessment / Plan / Recommendation Clinical Impression  Clinical swallow evaluation completed at bedside. Pt denies difficulty swallowing, but agreeable to swallow evaluation. Pt wears well fitting U/L dentures. Her dinner tray is nearby and barely touched, however she reports that she ate a late lunch and is not very hungry. Oral motor examination WNL. Pt shows no overt signs or symptoms of aspiration clinically during self presentations of regular textures and thin liquids. Pt with mild palatal residue following saltine crackers, which cleared with liquid wash. Pt repeatedly insisted that she had no difficulty swallowing, but complained of being cold (brought her a warm blanket). Recommend continue diet as ordered (regular textures and thin liquids) and no further SLP follow up indicated at this time. OK for po medications whole with water. Information relayed to RN who also confirmed that Pt swallowed medications today without issue.   SLP Visit Diagnosis: Dysphagia, unspecified (R13.10)    Aspiration Risk  No limitations    Diet Recommendation Regular;Thin liquid   Liquid Administration via: Cup;Straw Medication Administration: Whole meds with liquid Supervision: Patient able to self feed Postural Changes: Seated upright at 90 degrees;Remain upright for at least 30 minutes after po intake    Other  Recommendations Oral Care Recommendations: Oral care BID Other Recommendations: Clarify dietary restrictions   Follow up Recommendations None      Frequency and Duration            Prognosis Prognosis for Safe Diet Advancement: Good      Swallow Study   General Date of Onset:  09/26/17 HPI: 67 y.o. female with medical history significant of anemia, anxiety, ESRD on hemodialysis Tuesday/Thursday/Saturday, diverticulosis/diverticulitis, history of dysphagia, history of fracture of left femoral neck, GERD, hypertension/history of hypertensive urgency, hyperparathyroidism due to renal insufficiency, hypothyroidism, history of intracerebral hemorrhage, meningocele, protein calorie malnutrition, stroke, history of uterine cancer, history of vertical diplopia and vertigo; who presented to ED with complaints of AMS, confusion and slurred speech. These symptoms in the presence of hypoglycemia. Admitted to complete work up for CVA (found in her MRI exam) and AMS.  Although the patient did have some sundowning, her mental status overall improved gradually throughout the hospitalization.  She was near her baseline at the time of discharge. Type of Study: Bedside Swallow Evaluation Previous Swallow Assessment: None on record Diet Prior to this Study: Regular;Thin liquids Temperature Spikes Noted: No Respiratory Status: Room air History of Recent Intubation: No Behavior/Cognition: Alert;Cooperative;Pleasant mood Oral Cavity Assessment: Within Functional Limits Oral Care Completed by SLP: Recent completion by staff Oral Cavity - Dentition: Dentures, top;Dentures, bottom Vision: Functional for self-feeding Self-Feeding Abilities: Able to feed self Patient Positioning: Upright in bed Baseline Vocal Quality: Normal Volitional Cough: Strong Volitional Swallow: Able to elicit    Oral/Motor/Sensory Function Overall Oral Motor/Sensory Function: Within functional limits   Ice Chips Ice chips: Within functional limits Presentation: Spoon   Thin Liquid Thin Liquid: Within functional limits Presentation: Straw;Self Fed    Nectar Thick Nectar Thick Liquid: Not tested  Honey Thick Honey Thick Liquid: Not tested   Puree Puree: Within functional limits Presentation: Spoon    Solid   Thank you,  Genene Churn, CCC-SLP [phone]    Solid: Within functional limits Presentation: Self Fed Other Comments: mild soft palate residue which cleared with liquid wash        Kainan Patty 09/29/2017,6:43 PM

## 2017-09-29 NOTE — Evaluation (Signed)
Physical Therapy Evaluation Patient Details Name: Sonya [name] [name] MRN: [id number] DOB: 12-30-1950 Today's Date: 09/29/2017   History of Present Illness  Sonya [name] [name] is a 67yo black female who comes to [facility] with AMS, slurred speech on 2/25, BG noted to be 43 in ED. PMH: ESRD on HD T/Th/Sa, 2016 Lt femoral fracture s/p ORIF, HTN, hypoTSH, meningocele, intracranial hemorrhage, CVA, uterina CA.    Clinical Impression  Sonya admitted with above diagnosis. Sonya currently with functional limitations due to the deficits listed below (see "Sonya Problem List"). Upon entry, the patient is received recumbent in bed, family present. The Sonya is awake and agreeable to participate. Sonya [name] [name]/o of chronic progressive Right hip pain, still awaiting appointment with orthopedist next month. Functional mobility assessment demonstrates heavy strength impairment the Sonya now requiring Mod-assist physical assistance for transfers, and AMB is limited to 6 feet with orthostatic episode at the end of this distance, whereas the patient performed these at a higher level of independence PTA. Sonya has falls history, 1x in past 3 months, progressively more inactive d/t low motivation and chronic progressive Right hip pain. Sonya is not safe to DC to home at this time, but requires 24/7 supervision for safety, and physical assistance with ADL. Sonya will benefit from skilled Sonya intervention to increase independence and safety with basic mobility in preparation for discharge to the venue listed below.       09/29/17 1047  Therapy Vitals  Patient Position (if appropriate) Orthostatic Vitals  Orthostatic Sitting  BP- Sitting 128/65  Pulse- Sitting 94  Orthostatic Standing at 0 minutes  BP- Standing at 0 minutes (!) 86/45  Pulse- Standing at 0 minutes 115       Follow Up Recommendations SNF    Equipment Recommendations  None recommended by Sonya    Recommendations for Other Services       Precautions / Restrictions Precautions Precautions:  Fall Restrictions Weight Bearing Restrictions: No      Mobility  Bed Mobility Overal bed mobility: Needs Assistance Bed Mobility: Supine to Sit     Supine to sit: Min guard;Supervision     General bed mobility comments: recumbent to EOB  Transfers Overall transfer level: Needs assistance Equipment used: Rolling walker (2 wheeled) Transfers: Sit to/from Stand Sit to Stand: Mod assist            Ambulation/Gait Ambulation/Gait assistance: Min guard Ambulation Distance (Feet): 6 Feet Assistive device: Rolling walker (2 wheeled)   Gait velocity: 57ft excursion requires abotu 2 minutes to perform    General Gait Details: very slow, significant structural right knee valgus noted, with flexed knee stance.   Stairs            Wheelchair Mobility    Modified Rankin (Stroke Patients Only)       Balance Overall balance assessment: Mild deficits observed, not formally tested;History of Falls(feels dizzy and weak after 6 feet adn reports as though she is going to collapse: Sonya with orthostatic BP. )                                           Pertinent Vitals/Pain Pain Assessment: Faces Faces Pain Scale: Hurts little more Pain Location: Right hip  Pain Descriptors / Indicators: Aching Pain Intervention(s): Limited activity within patient's tolerance;Monitored during session;Repositioned    Home Living Family/patient expects to be discharged to:: Private residence Living Arrangements: Children  Available Help at Discharge: Family;Available 24 hours/day(daughter works 9-5. ) Type of Home: House Home Access: Stairs to enter   [employer] of Steps: 3 Home Layout: Two level;Laundry or work area in basement;Able to live on main level with bedroom/bathroom Home Equipment: Environmental consultant - 2 wheels;Cane - single point;Wheelchair - [employer] - 4 wheels;Bedside commode      Prior Function Level of Independence: Independent with assistive  device(s)         Comments: daughter assists with transportation; rollator for limited community distances      Hand Dominance        Extremity/Trunk Assessment        Lower Extremity Assessment Lower Extremity Assessment: Generalized weakness    Cervical / Trunk Assessment Cervical / Trunk Assessment: Kyphotic(forweard flexed)  Communication   Communication: No difficulties  Cognition Arousal/Alertness: Awake/alert Behavior During Therapy: WFL for tasks assessed/performed Overall Cognitive Status: Within Functional Limits for tasks assessed                                        General Comments      Exercises     Assessment/Plan    Sonya Assessment Patient needs continued Sonya services  Sonya Problem List Decreased activity tolerance;Decreased strength;Decreased balance;Decreased mobility       Sonya Treatment Interventions Functional mobility training;Therapeutic activities;Balance training;Therapeutic exercise;Patient/family education;Cognitive remediation    Sonya Goals (Current goals can be found in the Care Plan section)  Acute Rehab Sonya Goals Patient Stated Goal: regain strength  Sonya Goal Formulation: With patient Time For Goal Achievement: 10/13/17 Potential to Achieve Goals: Fair    Frequency 7X/week   Barriers to discharge Decreased caregiver support;Inaccessible home environment 3 steps without rail to enter    Co-evaluation               AM-PAC Sonya "6 Clicks" Daily Activity  Outcome Measure Difficulty turning over in bed (including adjusting bedclothes, sheets and blankets)?: A Lot Difficulty moving from lying on back to sitting on the side of the bed? : A Lot Difficulty sitting down on and standing up from a chair with arms (e.g., wheelchair, bedside commode, etc,.)?: Unable Help needed moving to and from a bed to chair (including a wheelchair)?: Total Help needed walking in hospital room?: Total Help needed climbing 3-5 steps  with a railing? : Total 6 Click Score: 8    End of Session Equipment Utilized During Treatment: Gait belt Activity Tolerance: Patient limited by fatigue;Treatment limited secondary to medical complications (Comment) Patient left: in bed;with family/visitor present(with ultrasound in room ) Nurse Communication: Mobility status Sonya Visit Diagnosis: Muscle weakness (generalized) (M62.81);History of falling (Z91.81);Difficulty in walking, not elsewhere classified (R26.2)    Time: 1610-9604 Sonya Time Calculation (min) (ACUTE ONLY): 33 min   Charges:   Sonya Evaluation $Sonya Eval Moderate Complexity: 1 Mod Sonya Treatments $Therapeutic Activity: 8-22 mins   Sonya G Codes:        11:02 AM, 10-Oct-2017 Rosamaria Lints, Sonya, DPT Physical Therapist at [facility] [phone] (office)     Sonya [name] [name] 10/10/17, 10:58 AM

## 2017-09-29 NOTE — Progress Notes (Signed)
Subjective: Interval History: has no complaint of nausea or vomiting.  She denies also any difficulty breathing..  Objective: Vital signs in last 24 hours: Temp:  [98 F (36.7 C)-98.9 F (37.2 C)] 98.6 F (37 C) (02/28 0544) Pulse Rate:  [92-98] 93 (02/28 0544) Resp:  [18-20] 20 (02/28 0544) BP: (117-148)/(59-78) 122/61 (02/28 0544) SpO2:  [96 %-100 %] 100 % (02/28 0544) Weight change:   Intake/Output from previous day: 02/27 0701 - 02/28 0700 In: 1052.9 [P.O.:120; I.V.:932.9] Out: -  Intake/Output this shift: No intake/output data recorded.  General appearance: alert, cooperative and no distress Resp: clear to auscultation bilaterally Cardio: irregularly irregular rhythm Extremities: No edema  Lab Results: Recent Labs    09/27/17 1045 09/28/17 0622  WBC 5.0 4.7  HGB 10.5* 11.2*  HCT 32.4* 34.5*  PLT 214 230   BMET:  Recent Labs    09/27/17 1045 09/28/17 0625  NA 135 134*  K 3.7 3.4*  CL 97* 93*  CO2 23 28  GLUCOSE 139* 128*  BUN 54* 19  CREATININE 7.97* 4.11*  CALCIUM 8.6* 8.4*   No results for input([name]): PTH in the last 72 hours. Iron Studies: No results for input([name]): IRON, TIBC, TRANSFERRIN, FERRITIN in the last 72 hours.  Studies/Results: US Carotid Bilateral  Result Date: 09/27/2017 CLINICAL DATA:  Altered mental status, stroke symptoms EXAM: BILATERAL CAROTID DUPLEX ULTRASOUND TECHNIQUE: Pearline Cables scale imaging, color Doppler and duplex ultrasound were performed of bilateral carotid and vertebral arteries in the neck. COMPARISON:  09/26/2017 FINDINGS: Criteria: Quantification of carotid stenosis is based on velocity parameters that correlate the residual internal carotid diameter with NASCET-based stenosis levels, using the diameter of the distal internal carotid lumen as the denominator for stenosis measurement. The following velocity measurements were obtained: RIGHT ICA:  94/32 cm/sec CCA:  24/2 cm/sec SYSTOLIC ICA/CCA RATIO:  6.83 DIASTOLIC ICA/CCA RATIO:   4.9 ECA:  107 cm/sec LEFT ICA:  71/21 cm/sec CCA:  419/62 cm/sec SYSTOLIC ICA/CCA RATIO:  0.7 DIASTOLIC ICA/CCA RATIO:  2.29 ECA:  108 cm/sec RIGHT CAROTID ARTERY: Minor echogenic shadowing plaque formation. No hemodynamically significant right ICA stenosis, velocity elevation, or turbulent flow. Degree of narrowing less than 50%. RIGHT VERTEBRAL ARTERY: Antegrade in the cervical region. Please refer to the MRA report. LEFT CAROTID ARTERY: Similar scattered minor echogenic plaque formation. No hemodynamically significant left ICA stenosis, velocity elevation, or turbulent flow. LEFT VERTEBRAL ARTERY:  Antegrade in the cervical region. IMPRESSION: Minor carotid atherosclerosis. No hemodynamically significant ICA stenosis. Degree of narrowing less than 50% bilaterally by ultrasound criteria. Patent antegrade vertebral flow bilaterally in the cervical region. 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 Electronically Signed   By: Jerilynn Mages.  Shick M.D.   On: 09/27/2017 11:51    I have reviewed the patient'[name] current medications.  Assessment/Plan: 1] altered mental status: Presently she is oriented to place and person.  Her mental status has returned to her baseline. 2] end-stage renal disease: She is status post hemodialysis yesterday.  Presently she did not have any nausea or vomiting. 3] anemia: Her hemoglobin is within our target goal 4] bone and mineral disorder: Patient is not on any binder.  Her phosphorus went down to 1.9 mainly because of her nutrition. 5] hypertension: Her blood pressure is reasonably controlled 6] hypothyroidism: She is on Synthroid 7] fluid status: No sign of fluid overload Plan: 1]Patient advised to increase her phosphorus intake 2] will DC Sensipar 3] We will dialyze patient today  4] K-phos neutra 500 mg po one dose    LOS: 1 day   Sonya [name] 09/29/2017,8:58 AM

## 2017-09-30 LAB — HEPATITIS B SURFACE ANTIGEN: Hepatitis B Surface Ag: NEGATIVE

## 2017-09-30 MED ORDER — ATORVASTATIN CALCIUM 40 MG PO TABS
40.0000 mg | ORAL_TABLET | Freq: Every day | ORAL | Status: DC
  Administered 2017-10-01: 40 mg via ORAL
  Filled 2017-09-30: qty 1

## 2017-09-30 NOTE — Care Management (Signed)
Note read about family's concerns about DC plan. On 09/29/17 CM spoke with daughter at length about why pt's medicare will not pay for STR.  CM contacted [facility] program about expediting services, [facility] referral made until PACE program can be started. Daughter verbalized understanding. CM has made CSW consult to discuss option (again) to pay privately as this would be only payor source for STR.

## 2017-09-30 NOTE — Progress Notes (Signed)
Sonya [name]  MRN: [id number]  DOB/AGE: 1951-03-05 67 y.o.  Primary Care Physician:Fusco, Lyman Bishop, MD  Admit date: 09/26/2017  Chief Complaint:  Chief Complaint  Patient presents with  . Altered Mental Status    S-Pt presented on  09/26/2017 with  Chief Complaint  Patient presents with  . Altered Mental Status  .     Pt offers no new complaints.     Pt main concern was " When will I get dialyzed tomorrow?"   meds . allopurinol  150 mg Oral Daily  . aspirin EC  81 mg Oral Daily  . atorvastatin  20 mg Oral Daily  . folic acid  2 mg Oral Daily  . levothyroxine  50 mcg Oral QAC breakfast  . lidocaine-prilocaine  1 application Topical Q T,Th,Sat-1800  . NIFEdipine  30 mg Oral QHS  . pantoprazole  40 mg Oral BID AC  . vitamin B-12  500 mcg Oral Daily  . Vitamin D (Ergocalciferol)  50,000 Units Oral Q7 days       Physical Exam: Vital signs in last 24 hours: Temp:  [98.7 F (37.1 C)-99.2 F (37.3 C)] 99.2 F (37.3 C) (03/01 0345) Pulse Rate:  [88-100] 92 (03/01 0345) Resp:  [18] 18 (03/01 0345) BP: (98-137)/(51-70) 121/62 (03/01 0345) SpO2:  [100 %] 100 % (03/01 0345) Weight:  [110 lb 14.3 oz (50.3 kg)] 110 lb 14.3 oz (50.3 kg) (03/01 0345) Weight change:  Last BM Date: 09/28/17  Intake/Output from previous day: 02/28 0701 - 03/01 0700 In: 120 [P.O.:120] Out: 2000  Total I/O In: 250 [P.O.:250] Out: -    Physical Exam: General- pt is awake,alert,follows comands. Resp- No acute REsp distress, CTA B/L NO Rhonchi CVS- S1S2 regular in rate and rhythm GIT- BS+, soft, NT, ND EXT- NO LE Edema, Cyanosis Access- AVF+   Lab Results: CBC Recent Labs    09/28/17 0622  WBC 4.7  HGB 11.2*  HCT 34.5*  PLT 230    BMET Recent Labs    09/28/17 0625  NA 134*  K 3.4*  CL 93*  CO2 28  GLUCOSE 128*  BUN 19  CREATININE 4.11*  CALCIUM 8.4*    MICRO Recent Results (from the past 240 hour(s))  Culture, blood (Routine X 2) w Reflex to ID Panel      Status: None (Preliminary result)   Collection Time: 09/26/17  3:47 PM  Result Value Ref Range Status   Specimen Description BLOOD LEFT WRIST  Final   Special Requests   Final    BOTTLES DRAWN AEROBIC ONLY Blood Culture adequate volume   Culture   Final    NO GROWTH 4 DAYS Performed at [facility], [address] [place], [place] [address]    Report Status PENDING  Incomplete  Culture, blood (Routine X 2) w Reflex to ID Panel     Status: None (Preliminary result)   Collection Time: 09/26/17  3:48 PM  Result Value Ref Range Status   Specimen Description BLOOD LEFT HAND  Final   Special Requests   Final    BOTTLES DRAWN AEROBIC ONLY Blood Culture adequate volume   Culture   Final    NO GROWTH 4 DAYS Performed at [facility], [address] [place], [place] [address]    Report Status PENDING  Incomplete  MRSA PCR Screening     Status: None   Collection Time: 09/27/17  4:25 PM  Result Value Ref Range Status   MRSA by PCR NEGATIVE NEGATIVE Final  Comment:        The GeneXpert MRSA Assay (FDA approved for NASAL specimens only), is one component of a comprehensive MRSA colonization surveillance program. It is not intended to diagnose MRSA infection nor to guide or monitor treatment for MRSA infections. 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  Pt on Tuesday/Thursday/Saturday schedule as outpt               Pt was dialyzed yesterday .               No need of dialysis today  2)HTN BP stable Medication- On Calcium Channel Blockers   3)Anemia In  ESRD the goal for HGb is 9--11 HGb is at goal . NO need of epo during Hd as hgb more than  11  4)CKD Mineral-Bone Disorder PTH elevated  Secondary Hyperparathyroidism  present Phosphorus low  5)CNS- hx of cerebral bleed Primary MD following  6)Electrolytes  Hypokalemic minimal  Hyponatremic sec to ESRD  7)Acid base Co2 at goal     Plan:  Will continue current care Will dialyze in am  Will use 3k bath    [name] S 09/30/2017, 1:27 PM

## 2017-09-30 NOTE — Progress Notes (Signed)
Physical Therapy Treatment Patient Details Name: Sonya [name] MRN: [id number] DOB: 04-05-1951 Today's Date: 09/30/2017    History of Present Illness Sonya [name] is a 67yo black female who comes to [facility] with AMS, slurred speech on 2/25, BG noted to be 43 in ED. PMH: ESRD on HD T/Th/Sa, 2016 Lt femoral fracture s/p ORIF, HTN, hypoTSH, meningocele, intracranial hemorrhage, CVA, uterina CA.      PT Comments    Patient requires less assistance for supine to sit, scooting to bedside, transfers and gait, requires assistance to move RW forward when taking steps in room and tolerated sitting up in chair after therapy.  Patient and her daughter instructed in HEP with understanding acknowledged and encouraged to do 2x/daily x 10 reps each or as tolerated when return home.  Patient will benefit from continued physical therapy in hospital and recommended venue below to increase strength, balance, endurance for safe ADLs and gait.   Follow Up Recommendations  SNF;Supervision for mobility/OOB     Equipment Recommendations       Recommendations for Other Services       Precautions / Restrictions Precautions Precautions: Fall Restrictions Weight Bearing Restrictions: No    Mobility  Bed Mobility Overal bed mobility: Needs Assistance Bed Mobility: Supine to Sit     Supine to sit: Min guard        Transfers Overall transfer level: Needs assistance Equipment used: Rolling walker (2 wheeled) Transfers: Sit to/from [facility] Sit to Stand: Min assist Stand pivot transfers: Min guard          Ambulation/Gait Ambulation/Gait assistance: Min guard Ambulation Distance (Feet): 20 Feet Assistive device: Rolling walker (2 wheeled) Gait Pattern/deviations: Decreased step length - right;Decreased step length - left;Decreased stride length   Gait velocity interpretation: Below normal speed for age/gender General Gait Details: slow labored cadence, required assistance to  move RW forward, right knee valgus, no loss of balance, limited secondary to fatigue   Stairs            Wheelchair Mobility    Modified Rankin (Stroke Patients Only)       Balance Overall balance assessment: Needs assistance Sitting-balance support: No upper extremity supported;Feet supported Sitting balance-Leahy Scale: Good     Standing balance support: Bilateral upper extremity supported;During functional activity Standing balance-Leahy Scale: Fair                              Cognition Arousal/Alertness: Awake/alert Behavior During Therapy: WFL for tasks assessed/performed Overall Cognitive Status: Within Functional Limits for tasks assessed                                        Exercises General Exercises - Lower Extremity Ankle Circles/Pumps: Supine;AROM;Strengthening;Both;5 reps Quad Sets: Supine;AROM;Strengthening;Both;5 reps Short Arc Quad: Supine;AROM;Strengthening;Right;5 reps Long Arc Quad: Seated;AROM;Strengthening;Both;5 reps Heel Slides: Supine;AROM;Strengthening;Both;5 reps Hip Flexion/Marching: Seated;AROM;Strengthening;Both;5 reps    General Comments        Pertinent Vitals/Pain Pain Assessment: No/denies pain    Home Living                      Prior Function            PT Goals (current goals can now be found in the care plan section) Acute Rehab PT Goals Patient Stated Goal: regain strength  PT Goal Formulation:  With patient/family Time For Goal Achievement: 10/13/17 Potential to Achieve Goals: Good Progress towards PT goals: Progressing toward goals    Frequency    Min 3X/week      PT Plan Current plan remains appropriate    Co-evaluation              AM-PAC PT "6 Clicks" Daily Activity  Outcome Measure  Difficulty turning over in bed (including adjusting bedclothes, sheets and blankets)?: A Little Difficulty moving from lying on back to sitting on the side of the bed? :  A Little Difficulty sitting down on and standing up from a chair with arms (e.g., wheelchair, bedside commode, etc,.)?: A Little Help needed moving to and from a bed to chair (including a wheelchair)?: A Little Help needed walking in hospital room?: A Lot Help needed climbing 3-5 steps with a railing? : A Lot 6 Click Score: 16    End of Session   Activity Tolerance: Patient tolerated treatment well;Patient limited by fatigue Patient left: in chair;with call bell/phone within reach;with family/visitor present Nurse Communication: Mobility status PT Visit Diagnosis: Muscle weakness (generalized) (M62.81);History of falling (Z91.81);Difficulty in walking, not elsewhere classified (R26.2)     Time: 4356-8616 PT Time Calculation (min) (ACUTE ONLY): 22 min  Charges:  $Therapeutic Activity: 8-22 mins                    G Codes:       11:59 AM, October 27, 2017 Sonya [name], MPT Physical Therapist with [facility] [address] [phone] office [phone] mobile phone

## 2017-09-30 NOTE — Clinical Social Work Note (Signed)
Notified by RN CM, Janett Billow, that initially she entered a referral for CSW to speak with pt/famiy about private pay at [facility] but pt/family stating that they are not interested in that option. Will cancel the referral.

## 2017-09-30 NOTE — Progress Notes (Signed)
PROGRESS NOTE  Sonya [name] DJT:[id number] DOB: 05/01/1951 DOA: 09/26/2017 PCP: Redmond School, MD  Brief History:  67 y.o.femalewith medical history significant of anemia, anxiety, ESRD on hemodialysis Tuesday/Thursday/Saturday, diverticulosis/diverticulitis, history of dysphagia, history of fracture of left femoral neck, GERD, hypertension/history of hypertensive urgency, hyperparathyroidism due to renal insufficiency, hypothyroidism, history of intracerebral hemorrhage, meningocele, protein calorie malnutrition, stroke, history of uterine cancer, history of vertical diplopiaand vertigo; who presented to ED with complaints of AMS, confusion and slurred speech. These symptoms in the presence of hypoglycemia. Admitted to complete work up for CVA (found in her MRI exam) and AMS.  Although the patient did have some sundowning, her mental status overall improved gradually throughout the hospitalization.  She was near her baseline mental status at the time of discharge. Family did not agree with discharge home.  They are appealing to Medicare.    Assessment/Plan: Acute ischemic stroke -PT/OT evaluation-->SNF -Unfortunately, due to insurance issue, family would have to pay out-of-pocket which they are not able to do -set up [place], [place] aide, [place] SW -Speech therapy eval--regular diet with thin liquids -CT brain--no acute abnormality -MRI brain--punctate focus of ischemia inferior medial right temporal lobe -MRA brain--basilar artery patent with unchanged severe proximal narrowing. Distal right vertebral artery narrowing/occlusion -Carotid Duplex--negative for hemodynamically significant stenosis -Echo--EF 65-70%, no PFO, mild TR, G1DD, no WMA -LDL--93 -HbA1C-5.5 -Antiplatelet--aspirin 81 mg daily  Acute metabolic encephalopathy -Multifactorial including acute stroke, low normal B12 level, hypoglycemia, and hospital delirium -The patient remains intermittently confused and  agitated requiring IV Ativan during the hospitalization -Daughter at bedside relates intermittent episodes of confusion at baseline--pt near baseline mental status -Suspect patient has underlying cognitive impairment/dementia at baseline -Check ammonia--25 -Serum B12 289 -TSH-0.767 -supplement B12 -refer to neuropsychiatry with Annabella after d/c  hypoglycemia -Due to poorpointake -Improved  ESRD -Appreciate nephrology consult -Received HD 09/27/2017 and 09/29/17  Secondary hyperparathyroidism -DisContinue Sensipar per renal due to low phosphorus -Management per nephrology  Essential hypertension -Continue nifedipine--decrease dose to 30 mg daily due to orthostasis during the hospitalization -BP controlled  Anemia of CKD -Baseline hemoglobin 11-12 -Hemoglobin at baseline  Hyperlipidemia -Continue statin--increase atorvastatin to 20 mg daily  Hypophosphatemia -replete -stop sensipar per renal    Disposition Plan:   Home vs SNF pending Medicare appeal Family Communication:  Daughter updated at bedside  Consultants:  renal Code Status:  FULL   DVT Prophylaxis:  SCDs   Procedures: As Listed in Progress Note Above  Antibiotics: None    Subjective: Patient denies fevers, chills, headache, chest pain, dyspnea, nausea, vomiting, diarrhea, abdominal pain, dysuria, hematuria, hematochezia, and melena.   Objective: Vitals:   09/29/17 1705 09/29/17 2034 09/30/17 0345 09/30/17 1500  BP: 128/68 (!) 111/58 121/62 108/63  Pulse: 88 100 92 94  Resp:  18 18 18   Temp:  98.7 F (37.1 C) 99.2 F (37.3 C) 98.5 F (36.9 C)  TempSrc:  Oral Oral Oral  SpO2:  100% 100% 100%  Weight:   50.3 kg (110 lb 14.3 oz)   Height:        Intake/Output Summary (Last 24 hours) at 09/30/2017 1752 Last data filed at 09/30/2017 1500 Gross per 24 hour  Intake 490 ml  Output -  Net 490 ml   Weight change:  Exam:   General:  Pt is alert, follows commands appropriately,  not in acute distress  HEENT: No icterus, No thrush, No neck mass, [place]/AT  Cardiovascular: RRR, S1/S2,  no rubs, no gallops  Respiratory: CTA bilaterally, no wheezing, no crackles, no rhonchi  Abdomen: Soft/+BS, non tender, non distended, no guarding  Extremities: No edema, No lymphangitis, No petechiae, No rashes, no synovitis   Data Reviewed: I have personally reviewed following labs and imaging studies Basic Metabolic Panel: Recent Labs  Lab 09/26/17 1634 09/27/17 0652 09/27/17 1045 09/28/17 0625  NA 136 136 135 134*  K 3.6 3.6 3.7 3.4*  CL 96* 97* 97* 93*  CO2 21* 24 23 28   GLUCOSE 122* 114* 139* 128*  BUN 50* 53* 54* 19  CREATININE 7.20* 7.60* 7.97* 4.11*  CALCIUM 9.2 8.8* 8.6* 8.4*  PHOS  --   --  2.1* 1.9*   Liver Function Tests: Recent Labs  Lab 09/26/17 1634 09/27/17 1045 09/28/17 0625  AST 23  --   --   ALT 8*  --   --   ALKPHOS 76  --   --   BILITOT 0.6  --   --   PROT 8.1  --   --   ALBUMIN 3.2* 2.9* 3.0*   Recent Labs  Lab 09/26/17 1634  LIPASE 52*   Recent Labs  Lab 09/28/17 1301  AMMONIA 25   Coagulation Profile: No results for input(s): INR, PROTIME in the last 168 hours. CBC: Recent Labs  Lab 09/26/17 1547 09/27/17 0652 09/27/17 1045 09/28/17 0622  WBC 11.7* 5.5 5.0 4.7  NEUTROABS 9.5*  --   --   --   HGB 11.8* 10.6* 10.5* 11.2*  HCT 37.2 32.0* 32.4* 34.5*  MCV 74.5* 72.9* 72.5* 72.8*  PLT 253 237 214 230   Cardiac Enzymes: Recent Labs  Lab 09/26/17 1634  TROPONINI <0.03   BNP: Invalid input(s): POCBNP CBG: Recent Labs  Lab 09/26/17 2057 09/26/17 2149 09/27/17 0628 09/27/17 1037 09/27/17 1610  GLUCAP 73 92 83 101* 83   HbA1C: Recent Labs    09/28/17 1301  HGBA1C 5.5   Urine analysis:    Component Value Date/Time   COLORURINE YELLOW 04/14/2015 1427   APPEARANCEUR CLEAR 04/14/2015 1427   LABSPEC 1.020 04/14/2015 1427   PHURINE 8.5 (H) 04/14/2015 1427   GLUCOSEU 100 (A) 04/14/2015 1427   HGBUR SMALL (A)  04/14/2015 1427   BILIRUBINUR NEGATIVE 04/14/2015 1427   KETONESUR NEGATIVE 04/14/2015 1427   PROTEINUR >300 (A) 04/14/2015 1427   UROBILINOGEN 0.2 04/14/2015 1427   NITRITE NEGATIVE 04/14/2015 1427   LEUKOCYTESUR TRACE (A) 04/14/2015 1427   Sepsis Labs: @LABRCNTIP (procalcitonin:4,lacticidven:4) ) Recent Results (from the past 240 hour(s))  Culture, blood (Routine X 2) w Reflex to ID Panel     Status: None (Preliminary result)   Collection Time: 09/26/17  3:47 PM  Result Value Ref Range Status   Specimen Description BLOOD LEFT WRIST  Final   Special Requests   Final    BOTTLES DRAWN AEROBIC ONLY Blood Culture adequate volume   Culture   Final    NO GROWTH 4 DAYS Performed at [facility], [address], [place], [place] [address]    Report Status PENDING  Incomplete  Culture, blood (Routine X 2) w Reflex to ID Panel     Status: None (Preliminary result)   Collection Time: 09/26/17  3:48 PM  Result Value Ref Range Status   Specimen Description BLOOD LEFT HAND  Final   Special Requests   Final    BOTTLES DRAWN AEROBIC ONLY Blood Culture adequate volume   Culture   Final    NO GROWTH 4 DAYS Performed at  [facility], [address] [place], [place] [address]    Report Status PENDING  Incomplete  MRSA PCR Screening     Status: None   Collection Time: 09/27/17  4:25 PM  Result Value Ref Range Status   MRSA by PCR NEGATIVE NEGATIVE Final    Comment:        The GeneXpert MRSA Assay (FDA approved for NASAL specimens only), is one component of a comprehensive MRSA colonization surveillance program. It is not intended to diagnose MRSA infection nor to guide or monitor treatment for MRSA infections. Performed at [facility], [address] [place], [place] [address]      Scheduled Meds: . allopurinol  150 mg Oral Daily  . aspirin EC  81 mg Oral Daily  . atorvastatin  20 mg Oral Daily  . folic acid  2 mg Oral Daily  . levothyroxine  50 mcg Oral QAC breakfast  .  lidocaine-prilocaine  1 application Topical Q D,EY,[id number]  . NIFEdipine  30 mg Oral QHS  . pantoprazole  40 mg Oral BID AC  . vitamin B-12  500 mcg Oral Daily  . Vitamin D (Ergocalciferol)  50,000 Units Oral Q7 days   Continuous Infusions: . sodium chloride    . sodium chloride      Procedures/Studies: Ct Head Wo Contrast  Result Date: 09/26/2017 CLINICAL DATA:  Altered mental status, recent fall EXAM: CT HEAD WITHOUT CONTRAST TECHNIQUE: Contiguous axial images were obtained from the base of the skull through the vertex without intravenous contrast. COMPARISON:  01/18/2017 FINDINGS: Brain: Similar age related brain atrophy and extensive chronic white matter microvascular ischemic changes throughout both cerebral hemispheres. No acute intracranial hemorrhage, mass lesion infarction midline shift, herniation, hydrocephalus, or extra-axial fluid collection. No focal mass effect or edema. Cisterns are patent. No cerebellar. Vascular: Intracranial atherosclerosis noted.  Hyperdense vessel. Skull: Intact skull.  Negative for fracture. Sinuses/Orbits: Symmetric orbits. 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 Other: None. IMPRESSION: Stable head CT without contrast. No acute intracranial abnormality or significant change. 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 Electronically Signed   By: Jerilynn Mages.  Shick M.D.   On: 09/26/2017 16:20   Mr [name] Nam Head Wo Contrast  Result Date: 09/26/2017 CLINICAL DATA:  Altered mental status and weakness for 2 days EXAM: MRI HEAD WITHOUT CONTRAST MRA HEAD WITHOUT CONTRAST TECHNIQUE: Multiplanar, multiecho pulse sequences of the brain and surrounding structures were obtained without intravenous contrast. Angiographic images of the head were obtained using MRA technique without contrast. COMPARISON:  Head CT 09/26/2017 Brain MRI 11/19/2016 FINDINGS: MRI HEAD  FINDINGS Brain: The midline structures are normal. Punctate focus of abnormal diffusion restriction within the medial right temporal lobe (series 4, image 68). Multifocal periventricular white matter hyperintensity, most often a result of chronic microvascular ischemia. No mass lesion. No chronic microhemorrhage or cerebral amyloid angiopathy. No hydrocephalus, age advanced atrophy or lobar predominant volume loss. There are scattered foci of chronic microhemorrhage within the brainstem, cerebellum and bilateral basal ganglia. Skull and upper cervical spine: The visualized skull base, calvarium, upper cervical spine and extracranial soft tissues are normal. Sinuses/Orbits: Meningoencephalocele extending into the left eye vision of the sphenoid sinus, unchanged. Normal orbits. MRA HEAD FINDINGS Intracranial internal carotid arteries: Normal. Anterior cerebral arteries: Normal. Middle cerebral arteries: Mild atherosclerotic irregularity. Posterior communicating arteries: Present on the right. Posterior cerebral arteries: Normal. Basilar artery: Moderate narrowing of the basilar artery, worse proximally, unchanged. Vertebral arteries:  The right vertebral artery is occluded proximal to the PICA origin. There is reconstitution of the distal V4 segment. Superior cerebellar arteries: Normal. Anterior inferior cerebellar arteries: Normal. Posterior inferior cerebellar arteries: Visualized on the left only. IMPRESSION: 1. Punctate focus of acute ischemia within the inferomedial right temporal lobe. No associated hemorrhage. 2. 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 3. Chronic ischemic microangiopathy. 4. Unchanged left sphenoid meningoencephalocele. Electronically Signed    By: Ulyses Jarred M.D.   On: 09/26/2017 20:28   Mr Brain Wo Contrast (neuro Protocol)  Result Date: 09/26/2017 CLINICAL DATA:  Altered mental status and weakness for 2 days EXAM: MRI HEAD WITHOUT CONTRAST MRA HEAD WITHOUT CONTRAST TECHNIQUE: Multiplanar, multiecho pulse sequences of the brain and surrounding structures were obtained without intravenous contrast. Angiographic images of the head were obtained using MRA technique without contrast. COMPARISON:  Head CT 09/26/2017 Brain MRI 11/19/2016 FINDINGS: MRI HEAD FINDINGS Brain: The midline structures are normal. Punctate focus of abnormal diffusion restriction within the medial right temporal lobe (series 4, image 68). Multifocal periventricular white matter hyperintensity, most often a result of chronic microvascular ischemia. No mass lesion. No chronic microhemorrhage or cerebral amyloid angiopathy. No hydrocephalus, age advanced atrophy or lobar predominant volume loss. There are scattered foci of chronic microhemorrhage within the brainstem, cerebellum and bilateral basal ganglia. Skull and upper cervical spine: The visualized skull base, calvarium, upper cervical spine and extracranial soft tissues are normal. Sinuses/Orbits: Meningoencephalocele extending into the left eye vision of the sphenoid sinus, unchanged. Normal orbits. MRA HEAD FINDINGS Intracranial internal carotid arteries: Normal. Anterior cerebral arteries: Normal. Middle cerebral arteries: Mild atherosclerotic irregularity. Posterior communicating arteries: Present on the right. Posterior cerebral arteries: Normal. Basilar artery: Moderate narrowing of the basilar artery, worse proximally, unchanged. Vertebral arteries: The right vertebral artery is occluded proximal to the PICA origin. There is reconstitution of the distal V4 segment. Superior cerebellar arteries: Normal. Anterior inferior cerebellar arteries: Normal. Posterior inferior cerebellar arteries: Visualized on the left only.  IMPRESSION: 1. Punctate focus of acute ischemia within the inferomedial right temporal lobe. No associated hemorrhage. 2. 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 3. Chronic ischemic microangiopathy. 4. Unchanged left sphenoid meningoencephalocele. Electronically Signed   By: Ulyses Jarred M.D.   On: 09/26/2017 20:28   US Carotid Bilateral  Result Date: 09/27/2017 CLINICAL DATA:  Altered mental status, stroke symptoms EXAM: BILATERAL CAROTID DUPLEX ULTRASOUND TECHNIQUE: Pearline Cables scale imaging, color Doppler and duplex ultrasound were performed of bilateral carotid and vertebral arteries in the neck. COMPARISON:  09/26/2017 FINDINGS: Criteria: Quantification of carotid stenosis is based on velocity parameters that correlate the residual internal carotid diameter with NASCET-based stenosis levels, using the diameter of the distal internal carotid lumen as the denominator for stenosis measurement. The following velocity measurements were obtained: RIGHT ICA:  94/32 cm/sec CCA:  17/4 cm/sec SYSTOLIC ICA/CCA RATIO:  0.81 DIASTOLIC ICA/CCA RATIO:  4.9 ECA:  107 cm/sec LEFT ICA:  71/21 cm/sec CCA:  448/18 cm/sec SYSTOLIC ICA/CCA RATIO:  0.7 DIASTOLIC ICA/CCA RATIO:  5.63 ECA:  108 cm/sec RIGHT CAROTID  ARTERY: Minor echogenic shadowing plaque formation. No hemodynamically significant right ICA stenosis, velocity elevation, or turbulent flow. Degree of narrowing less than 50%. RIGHT VERTEBRAL ARTERY: Antegrade in the cervical region. Please refer to the MRA report. LEFT CAROTID ARTERY: Similar scattered minor echogenic plaque formation. No hemodynamically significant left ICA stenosis, velocity elevation, or turbulent flow. LEFT VERTEBRAL ARTERY:  Antegrade in the cervical  region. IMPRESSION: Minor carotid atherosclerosis. No hemodynamically significant ICA stenosis. Degree of narrowing less than 50% bilaterally by ultrasound criteria. Patent antegrade vertebral flow bilaterally in the cervical region. 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 Electronically Signed   By: Jerilynn Mages.  Shick M.D.   On: 09/27/2017 11:51   Dg Chest Port 1 View  Result Date: 09/26/2017 CLINICAL DATA:  Altered mental status EXAM: PORTABLE CHEST 1 VIEW COMPARISON:  01/18/2017, CT chest 01/18/2017 FINDINGS: Patchy atelectasis or minimal infiltrate at the right base. No pleural effusion or consolidation. Stable cardiomediastinal silhouette with aortic atherosclerosis. No pneumothorax. IMPRESSION: Minimal patchy opacity at the right base, favor atelectasis. Otherwise no radiographic evidence for acute cardiopulmonary abnormality. Electronically Signed   By: Donavan Foil M.D.   On: 09/26/2017 15:08   Dg Hip Unilat W Or Wo Pelvis 2-3 Views Right  Result Date: 09/18/2017 CLINICAL DATA:  Right groin pain 1 week.  No injury. EXAM: DG HIP (WITH OR WITHOUT PELVIS) 2-3V RIGHT COMPARISON:  05/30/2015 and CT 11/20/2016 FINDINGS: Three screws bridging the left femoral neck into the femoral head intact. Mild symmetric degenerative change of the hips. Mild diffuse osteopenia. No acute fracture or dislocation. Mild degenerate change of the spine. IMPRESSION: No acute findings. Electronically Signed   By: Marin Olp M.D.   On: 09/18/2017 09:57    Orson Eva, DO  [facility] Pager [phone]  If 7PM-7AM, please contact night-coverage [email or web address] Password [email or web address] 09/30/2017, 5:52 PM   LOS: 2 days

## 2017-09-30 NOTE — Care Management (Signed)
CM spoke with daughter at bedside who says she does not have the money to pay privately and wishes to appeal DC. She has not yet contacted [employer] to begin appeal, is aware she has until 12pm today to do so.

## 2017-09-30 NOTE — Progress Notes (Signed)
Patient asking questions about why she is on IV fluids and why she is on a heart monitor. I paged Dr. Carles Collet about it and he D/C'd them since the patient is stable without them.

## 2017-10-01 LAB — CULTURE, BLOOD (ROUTINE X 2)
Culture: NO GROWTH
Culture: NO GROWTH
SPECIAL REQUESTS: ADEQUATE
SPECIAL REQUESTS: ADEQUATE

## 2017-10-01 MED ORDER — ATORVASTATIN CALCIUM 40 MG PO TABS: 40.0000 mg | ORAL_TABLET | Freq: Every day | ORAL | 1 refills | Status: DC

## 2017-10-01 MED ORDER — BISACODYL 10 MG RE SUPP
10.0000 mg | Freq: Once | RECTAL | Status: AC
  Administered 2017-10-01: 10 mg via RECTAL
  Filled 2017-10-01: qty 1

## 2017-10-01 MED ORDER — SENNA 8.6 MG PO TABS
2.0000 | ORAL_TABLET | Freq: Every day | ORAL | Status: DC
  Administered 2017-10-01 – 2017-10-02 (×2): 17.2 mg via ORAL
  Filled 2017-10-01 (×2): qty 2

## 2017-10-01 MED ORDER — POLYETHYLENE GLYCOL 3350 17 G PO PACK
17.0000 g | PACK | Freq: Every day | ORAL | Status: DC
  Administered 2017-10-01: 17 g via ORAL
  Filled 2017-10-01 (×2): qty 1

## 2017-10-01 NOTE — Discharge Summary (Addendum)
Physician Discharge Summary  Sonya [name] OMV:[id number] DOB: [birth year] DOA: 09/26/2017  PCP: Redmond School, MD  Admit date: 09/26/2017 Discharge date: 10/02/2017  Admitted From: Home Disposition:  Home   Recommendations for Outpatient Follow-up:  1. Follow up with PCP in 1-2 weeks 2. Please obtain BMP/CBC in one week   Home Health:Yes Equipment/Devices:HHPT, HHRN, SLP, Aide  Discharge Condition: Stable CODE STATUS: FULL Diet recommendation: renal   Brief/Interim Summary: 67 y.o.femalewith medical history significant of anemia, anxiety, ESRD on hemodialysis Tuesday/Thursday/Saturday, diverticulosis/diverticulitis, history of dysphagia, history of fracture of left femoral neck, GERD, hypertension/history of hypertensive urgency, hyperparathyroidism due to renal insufficiency, hypothyroidism, history of intracerebral hemorrhage, meningocele, protein calorie malnutrition, stroke, history of uterine cancer, history of vertical diplopiaand vertigo; who presented to ED with complaints of AMS, confusion and slurred speech. These symptoms in the presence of hypoglycemia. Admitted to complete work up for CVA (found in her MRI exam) and AMS.Although the patient did have some sundowning, her mental status overall improved gradually throughout the hospitalization. She was near her baselinemental statusat the time of discharge. Family did not agree with discharge home. They are appealed the discharge to Medicare.  The patient's Medicare appeal was denied.  Throughout appeals process, the patient's medical condition continued to stabilize and improved.  She continued to receive physical therapy who recommended skilled nursing facility.    Discharge Diagnoses:  Acute ischemic stroke -PT/OT evaluation-->SNF -Unfortunately, due to insurance issue, family would have to pay out-of-pocketwhich they are not able to do -set up [place], [place], [place] aide, [place] SW -Speech therapy eval--regular diet  with thin liquids -CT brain--no acute abnormality -MRI brain--punctate focus of ischemia inferior medial right temporal lobe -MRA brain--basilar artery patent with unchanged severe proximal narrowing. Distal right vertebral artery narrowing/occlusion -Carotid Duplex--negative for hemodynamically significant stenosis -Echo--EF 65-70%, no PFO, mild TR, G1DD, no WMA -LDL--93 -HbA1C-5.5 -Antiplatelet--aspirin 81 mg daily  Acute metabolic encephalopathy -Multifactorial including acute stroke, low normal B12 level, hypoglycemia, and hospital delirium -The patient remains intermittently confused and agitated requiring IV Ativanduring the hospitalization initially-->this ultimately improved -Daughter at bedside relates intermittent episodes of confusion at baseline--pt near baselinemental status -Suspect patient has underlying cognitive impairment/dementiaat baseline -Patient continued to improve clinically even after the initial discharge while she was waiting for her Medicare appeal. -Check ammonia--25 -Serum B12 289 -TSH-0.767 -supplement B12 -refer to neuropsychiatry with [place] after d/c  hypoglycemia -Due to poorpointake -Improved  ESRD -Appreciate nephrology consult -Received HD 09/27/2017, 09/29/17, and 10/01/17  Secondary hyperparathyroidism -DisContinue Sensiparper renal due to low phosphorus -Management per nephrology  Essential hypertension -Continue nifedipine--decrease dose to 30 mg daily due to orthostasis during the hospitalization -BP controlled  Anemia of CKD -Baseline hemoglobin 11-12 -Hemoglobin at baseline  Hyperlipidemia -Continue statin--increase atorvastatin to 40 mg daily -LDL--93  Hypophosphatemia -repleteed -stop sensipar per renal      Discharge Instructions  Discharge Instructions    Ambulatory referral to Neurology   Complete by:  As directed    Refer to [facility]--please schedule appointment for patient to see  Dr. Marcos Eke for neuropsychiatric testing   Diet - low sodium heart healthy   Complete by:  As directed    Increase activity slowly   Complete by:  As directed      Allergies as of 10/02/2017      Reactions   Cephalexin Itching      Medication List    STOP taking these medications   cinacalcet 30 MG tablet Commonly known as:  SENSIPAR  pantoprazole 40 MG tablet Commonly known as:  PROTONIX     TAKE these medications   allopurinol 300 MG tablet Commonly known as:  ZYLOPRIM Take 0.5 tablets (150 mg total) by mouth daily. What changed:  additional instructions   ALPRAZolam 0.5 MG tablet Commonly known as:  XANAX Take 1 tablet (0.5 mg total) by mouth 2 (two) times daily as needed for anxiety. What changed:    when to take this  reasons to take this   aspirin 81 MG EC tablet Take 1 tablet (81 mg total) by mouth daily.   atorvastatin 40 MG tablet Commonly known as:  LIPITOR Take 1 tablet (40 mg total) by mouth daily at 6 PM. What changed:    medication strength  how much to take  when to take this   folic acid 1 MG tablet Commonly known as:  FOLVITE Take 2 mg by mouth daily.   levothyroxine 50 MCG tablet Commonly known as:  SYNTHROID, LEVOTHROID Take 1 tablet (50 mcg total) by mouth daily. What changed:  when to take this   lidocaine-prilocaine cream Commonly known as:  EMLA Apply 1 application topically every Tuesday, Thursday, and Saturday at 6 PM.   NIFEdipine 30 MG 24 hr tablet Commonly known as:  PROCARDIA-XL/ADALAT CC Take 1 tablet (30 mg total) by mouth at bedtime. What changed:    medication strength  how much to take   THERATEARS 0.25 % Soln Generic drug:  Carboxymethylcellulose Sodium Apply 1-2 drops to eye daily as needed (for eye irritation).   Vitamin D (Ergocalciferol) 50000 units Caps capsule Commonly known as:  DRISDOL Take 50,000 Units by mouth every 7 (seven) days.      Follow-up Information    [facility]   Specialty:  Home Health Services Contact information: [place] [id number] [phone]        [facility].   Why:  Case manager has sent referral - may call after DC to follow up Contact information: [phone] [facility]         Allergies  Allergen Reactions  . Cephalexin Itching    Consultations:  renal   Procedures/Studies: Ct Head Wo Contrast  Result Date: 09/26/2017 CLINICAL DATA:  Altered mental status, recent fall EXAM: CT HEAD WITHOUT CONTRAST TECHNIQUE: Contiguous axial images were obtained from the base of the skull through the vertex without intravenous contrast. COMPARISON:  01/18/2017 FINDINGS: Brain: Similar age related brain atrophy and extensive chronic white matter microvascular ischemic changes throughout both cerebral hemispheres. No acute intracranial hemorrhage, mass lesion infarction midline shift, herniation, hydrocephalus, or extra-axial fluid collection. No focal mass effect or edema. Cisterns are patent. No cerebellar. Vascular: Intracranial atherosclerosis noted.  Hyperdense vessel. Skull: Intact skull.  Negative for fracture. Sinuses/Orbits: Symmetric orbits. 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 Other: None. IMPRESSION: Stable head CT without contrast. No acute intracranial abnormality or significant change. 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 Electronically Signed   By: Jerilynn Mages.  Shick M.D.   On: 09/26/2017 16:20   Mr [name] Nam Head Wo Contrast  Result Date: 09/26/2017 CLINICAL DATA:  Altered mental status and weakness for 2 days EXAM: MRI HEAD WITHOUT CONTRAST MRA HEAD WITHOUT CONTRAST TECHNIQUE: Multiplanar, multiecho pulse sequences of the brain and surrounding structures were obtained without intravenous contrast. Angiographic images of the head were  obtained using MRA technique without contrast. COMPARISON:  Head CT 09/26/2017 Brain MRI 11/19/2016 FINDINGS: MRI HEAD FINDINGS Brain: The midline structures are normal. Punctate focus of abnormal diffusion restriction within the medial right temporal lobe (series 4, image 68). Multifocal periventricular white matter hyperintensity, most often a result of chronic microvascular ischemia. No mass lesion. No chronic microhemorrhage or cerebral amyloid angiopathy. No hydrocephalus, age advanced atrophy or lobar predominant volume loss. There are scattered foci of chronic microhemorrhage within the brainstem, cerebellum and bilateral basal ganglia. Skull and upper cervical spine: The visualized skull base, calvarium, upper cervical spine and extracranial soft tissues are normal. Sinuses/Orbits: Meningoencephalocele extending into the left eye vision of the sphenoid sinus, unchanged. Normal orbits. MRA HEAD FINDINGS Intracranial internal carotid arteries: Normal. Anterior cerebral arteries: Normal. Middle cerebral arteries: Mild atherosclerotic irregularity. Posterior communicating arteries: Present on the right. Posterior cerebral arteries: Normal. Basilar artery: Moderate narrowing of the basilar artery, worse proximally, unchanged. Vertebral arteries: The right vertebral artery is occluded proximal to the PICA origin. There is reconstitution of the distal V4 segment. Superior cerebellar arteries: Normal. Anterior inferior cerebellar arteries: Normal. Posterior inferior cerebellar arteries: Visualized on the left only. IMPRESSION: 1. Punctate focus of acute ischemia within the inferomedial right temporal lobe. No associated hemorrhage. 2. 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 The basilar artery remains patent with unchanged severe proximal narrowing of the basilar artery with moderate mid to distal  narrowing. 3. Chronic ischemic microangiopathy. 4. Unchanged left sphenoid meningoencephalocele. Electronically Signed   By: Ulyses Jarred M.D.   On: 09/26/2017 20:28   Mr Brain Wo Contrast (neuro Protocol)  Result Date: 09/26/2017 CLINICAL DATA:  Altered mental status and weakness for 2 days EXAM: MRI HEAD WITHOUT CONTRAST MRA HEAD WITHOUT CONTRAST TECHNIQUE: Multiplanar, multiecho pulse sequences of the brain and surrounding structures were obtained without intravenous contrast. Angiographic images of the head were obtained using MRA technique without contrast. COMPARISON:  Head CT 09/26/2017 Brain MRI 11/19/2016 FINDINGS: MRI HEAD FINDINGS Brain: The midline structures are normal. Punctate focus of abnormal diffusion restriction within the medial right temporal lobe (series 4, image 68). Multifocal periventricular white matter hyperintensity, most often a result of chronic microvascular ischemia. No mass lesion. No chronic microhemorrhage or cerebral amyloid angiopathy. No hydrocephalus, age advanced atrophy or lobar predominant volume loss. There are scattered foci of chronic microhemorrhage within the brainstem, cerebellum and bilateral basal ganglia. Skull and upper cervical spine: The visualized skull base, calvarium, upper cervical spine and extracranial soft tissues are normal. Sinuses/Orbits: Meningoencephalocele extending into the left eye vision of the sphenoid sinus, unchanged. Normal orbits. MRA HEAD FINDINGS Intracranial internal carotid arteries: Normal. Anterior cerebral arteries: Normal. Middle cerebral arteries: Mild atherosclerotic irregularity. Posterior communicating arteries: Present on the right. Posterior cerebral arteries: Normal. Basilar artery: Moderate narrowing of the basilar artery, worse proximally, unchanged. Vertebral arteries: The right vertebral artery is occluded proximal to the PICA origin. There is reconstitution of the distal V4 segment. Superior cerebellar arteries:  Normal. Anterior inferior cerebellar arteries: Normal. Posterior inferior cerebellar arteries: Visualized on the left only. IMPRESSION: 1. Punctate focus of acute ischemia within the inferomedial right temporal lobe. No associated hemorrhage. 2. 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 3. Chronic ischemic  microangiopathy. 4. Unchanged left sphenoid meningoencephalocele. Electronically Signed   By: Ulyses Jarred M.D.   On: 09/26/2017 20:28   US Carotid Bilateral  Result Date: 09/27/2017 CLINICAL DATA:  Altered mental status, stroke symptoms EXAM: BILATERAL CAROTID DUPLEX ULTRASOUND TECHNIQUE: Pearline Cables scale imaging, color Doppler and duplex ultrasound were performed of bilateral carotid and vertebral arteries in the neck. COMPARISON:  09/26/2017 FINDINGS: Criteria: Quantification of carotid stenosis is based on velocity parameters that correlate the residual internal carotid diameter with NASCET-based stenosis levels, using the diameter of the distal internal carotid lumen as the denominator for stenosis measurement. The following velocity measurements were obtained: RIGHT ICA:  94/32 cm/sec CCA:  34/1 cm/sec SYSTOLIC ICA/CCA RATIO:  9.62 DIASTOLIC ICA/CCA RATIO:  4.9 ECA:  107 cm/sec LEFT ICA:  71/21 cm/sec CCA:  229/79 cm/sec SYSTOLIC ICA/CCA RATIO:  0.7 DIASTOLIC ICA/CCA RATIO:  8.92 ECA:  108 cm/sec RIGHT CAROTID ARTERY: Minor echogenic shadowing plaque formation. No hemodynamically significant right ICA stenosis, velocity elevation, or turbulent flow. Degree of narrowing less than 50%. RIGHT VERTEBRAL ARTERY: Antegrade in the cervical region. Please refer to the MRA report. LEFT CAROTID ARTERY: Similar scattered minor echogenic plaque formation. No hemodynamically  significant left ICA stenosis, velocity elevation, or turbulent flow. LEFT VERTEBRAL ARTERY:  Antegrade in the cervical region. IMPRESSION: Minor carotid atherosclerosis. No hemodynamically significant ICA stenosis. Degree of narrowing less than 50% bilaterally by ultrasound criteria. Patent antegrade vertebral flow bilaterally in the cervical region. 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 Electronically Signed   By: Jerilynn Mages.  Shick M.D.   On: 09/27/2017 11:51   Dg Chest Port 1 View  Result Date: 09/26/2017 CLINICAL DATA:  Altered mental status EXAM: PORTABLE CHEST 1 VIEW COMPARISON:  01/18/2017, CT chest 01/18/2017 FINDINGS: Patchy atelectasis or minimal infiltrate at the right base. No pleural effusion or consolidation. Stable cardiomediastinal silhouette with aortic atherosclerosis. No pneumothorax. IMPRESSION: Minimal patchy opacity at the right base, favor atelectasis. Otherwise no radiographic evidence for acute cardiopulmonary abnormality. Electronically Signed   By: Donavan Foil M.D.   On: 09/26/2017 15:08   Dg Hip Unilat W Or Wo Pelvis 2-3 Views Right  Result Date: 09/18/2017 CLINICAL DATA:  Right groin pain 1 week.  No injury. EXAM: DG HIP (WITH OR WITHOUT PELVIS) 2-3V RIGHT COMPARISON:  05/30/2015 and CT 11/20/2016 FINDINGS: Three screws bridging the left femoral neck into the femoral head intact. Mild symmetric degenerative change of the hips. Mild diffuse osteopenia. No acute fracture or dislocation. Mild degenerate change of the spine. IMPRESSION: No acute findings. Electronically Signed   By: Marin Olp M.D.   On: 09/18/2017 09:57        Discharge Exam: Vitals:   10/01/17 2131 10/02/17 0535  BP: 130/66 101/68  Pulse: 91 88  Resp: 16 17  Temp: 99.1 F (37.3 C) 98.5 F (36.9 C)  SpO2: 100% 100%   Vitals:   10/01/17 1600 10/01/17 1615 10/01/17 2131 10/02/17 0535  BP: 114/61 123/65 130/66 101/68  Pulse:  97 95 91 88  Resp:  16 16 17   Temp:  98 F (36.7 C) 99.1 F (37.3 C) 98.5 F (36.9 C)  TempSrc:  Oral Oral Oral  SpO2:  99% 100% 100%  Weight:  53.9 kg (118 lb 13.3 oz)  49.9 kg (110 lb 0.2 oz)  Height:        General: Pt is alert, awake, not in acute distress Cardiovascular: RRR, S1/S2 +, no rubs, no  gallops Respiratory: CTA bilaterally, no wheezing, no rhonchi Abdominal: Soft, NT, ND, bowel sounds + Extremities: no edema, no cyanosis   The results of significant diagnostics from this hospitalization (including imaging, microbiology, ancillary and laboratory) are listed below for reference.    Significant Diagnostic Studies: Ct Head Wo Contrast  Result Date: 09/26/2017 CLINICAL DATA:  Altered mental status, recent fall EXAM: CT HEAD WITHOUT CONTRAST TECHNIQUE: Contiguous axial images were obtained from the base of the skull through the vertex without intravenous contrast. COMPARISON:  01/18/2017 FINDINGS: Brain: Similar age related brain atrophy and extensive chronic white matter microvascular ischemic changes throughout both cerebral hemispheres. No acute intracranial hemorrhage, mass lesion infarction midline shift, herniation, hydrocephalus, or extra-axial fluid collection. No focal mass effect or edema. Cisterns are patent. No cerebellar. Vascular: Intracranial atherosclerosis noted.  Hyperdense vessel. Skull: Intact skull.  Negative for fracture. Sinuses/Orbits: Symmetric orbits. 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 Other: None. IMPRESSION: Stable head CT without contrast. No acute intracranial abnormality or significant change. 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 Electronically Signed   By: Jerilynn Mages.  Shick M.D.   On: 09/26/2017 16:20   Mr [name] Nam Head Wo Contrast  Result Date: 09/26/2017 CLINICAL DATA:  Altered mental status and weakness for 2 days EXAM: MRI  HEAD WITHOUT CONTRAST MRA HEAD WITHOUT CONTRAST TECHNIQUE: Multiplanar, multiecho pulse sequences of the brain and surrounding structures were obtained without intravenous contrast. Angiographic images of the head were obtained using MRA technique without contrast. COMPARISON:  Head CT 09/26/2017 Brain MRI 11/19/2016 FINDINGS: MRI HEAD FINDINGS Brain: The midline structures are normal. Punctate focus of abnormal diffusion restriction within the medial right temporal lobe (series 4, image 68). Multifocal periventricular white matter hyperintensity, most often a result of chronic microvascular ischemia. No mass lesion. No chronic microhemorrhage or cerebral amyloid angiopathy. No hydrocephalus, age advanced atrophy or lobar predominant volume loss. There are scattered foci of chronic microhemorrhage within the brainstem, cerebellum and bilateral basal ganglia. Skull and upper cervical spine: The visualized skull base, calvarium, upper cervical spine and extracranial soft tissues are normal. Sinuses/Orbits: Meningoencephalocele extending into the left eye vision of the sphenoid sinus, unchanged. Normal orbits. MRA HEAD FINDINGS Intracranial internal carotid arteries: Normal. Anterior cerebral arteries: Normal. Middle cerebral arteries: Mild atherosclerotic irregularity. Posterior communicating arteries: Present on the right. Posterior cerebral arteries: Normal. Basilar artery: Moderate narrowing of the basilar artery, worse proximally, unchanged. Vertebral arteries: The right vertebral artery is occluded proximal to the PICA origin. There is reconstitution of the distal V4 segment. Superior cerebellar arteries: Normal. Anterior inferior cerebellar arteries: Normal. Posterior inferior cerebellar arteries: Visualized on the left only. IMPRESSION: 1. Punctate focus of acute ischemia within the inferomedial right temporal lobe. No associated hemorrhage. 2. Occlusion of the distal right vertebral artery proximal to the PICA  origin, new compared to 11/19/2016, with reconstitution at the most distal aspect of V4. 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 3. Chronic ischemic microangiopathy. 4. Unchanged left sphenoid meningoencephalocele. Electronically Signed   By: Ulyses Jarred M.D.   On: 09/26/2017 20:28   Mr Brain Wo Contrast (neuro Protocol)  Result Date: 09/26/2017 CLINICAL DATA:  Altered mental status and weakness for 2 days EXAM: MRI HEAD WITHOUT CONTRAST MRA HEAD WITHOUT CONTRAST TECHNIQUE: Multiplanar, multiecho pulse sequences of the brain and surrounding structures were obtained without intravenous contrast. Angiographic images of the head  were obtained using MRA technique without contrast. COMPARISON:  Head CT 09/26/2017 Brain MRI 11/19/2016 FINDINGS: MRI HEAD FINDINGS Brain: The midline structures are normal. Punctate focus of abnormal diffusion restriction within the medial right temporal lobe (series 4, image 68). Multifocal periventricular white matter hyperintensity, most often a result of chronic microvascular ischemia. No mass lesion. No chronic microhemorrhage or cerebral amyloid angiopathy. No hydrocephalus, age advanced atrophy or lobar predominant volume loss. There are scattered foci of chronic microhemorrhage within the brainstem, cerebellum and bilateral basal ganglia. Skull and upper cervical spine: The visualized skull base, calvarium, upper cervical spine and extracranial soft tissues are normal. Sinuses/Orbits: Meningoencephalocele extending into the left eye vision of the sphenoid sinus, unchanged. Normal orbits. MRA HEAD FINDINGS Intracranial internal carotid arteries: Normal. Anterior cerebral arteries: Normal. Middle cerebral arteries: Mild atherosclerotic irregularity. Posterior communicating arteries: Present on the right. Posterior cerebral arteries: Normal. Basilar artery: Moderate narrowing of the  basilar artery, worse proximally, unchanged. Vertebral arteries: The right vertebral artery is occluded proximal to the PICA origin. There is reconstitution of the distal V4 segment. Superior cerebellar arteries: Normal. Anterior inferior cerebellar arteries: Normal. Posterior inferior cerebellar arteries: Visualized on the left only. IMPRESSION: 1. Punctate focus of acute ischemia within the inferomedial right temporal lobe. No associated hemorrhage. 2. 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 3. Chronic ischemic microangiopathy. 4. Unchanged left sphenoid meningoencephalocele. Electronically Signed   By: Ulyses Jarred M.D.   On: 09/26/2017 20:28   US Carotid Bilateral  Result Date: 09/27/2017 CLINICAL DATA:  Altered mental status, stroke symptoms EXAM: BILATERAL CAROTID DUPLEX ULTRASOUND TECHNIQUE: Pearline Cables scale imaging, color Doppler and duplex ultrasound were performed of bilateral carotid and vertebral arteries in the neck. COMPARISON:  09/26/2017 FINDINGS: Criteria: Quantification of carotid stenosis is based on velocity parameters that correlate the residual internal carotid diameter with NASCET-based stenosis levels, using the diameter of the distal internal carotid lumen as the denominator for stenosis measurement. The following velocity measurements were obtained: RIGHT ICA:  94/32 cm/sec CCA:  54/6 cm/sec SYSTOLIC ICA/CCA RATIO:  5.03 DIASTOLIC ICA/CCA RATIO:  4.9 ECA:  107 cm/sec LEFT ICA:  71/21 cm/sec CCA:  546/56 cm/sec SYSTOLIC ICA/CCA RATIO:  0.7 DIASTOLIC ICA/CCA RATIO:  8.12 ECA:  108 cm/sec RIGHT CAROTID ARTERY: Minor echogenic shadowing plaque formation. No hemodynamically significant right ICA stenosis, velocity elevation, or turbulent flow.  Degree of narrowing less than 50%. RIGHT VERTEBRAL ARTERY: Antegrade in the cervical region. Please refer to the MRA report. LEFT CAROTID ARTERY: Similar scattered minor echogenic plaque formation. No hemodynamically significant left ICA stenosis, velocity elevation, or turbulent flow. LEFT VERTEBRAL ARTERY:  Antegrade in the cervical region. IMPRESSION: Minor carotid atherosclerosis. No hemodynamically significant ICA stenosis. Degree of narrowing less than 50% bilaterally by ultrasound criteria. Patent antegrade vertebral flow bilaterally in the cervical region. 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 Electronically Signed   By: Jerilynn Mages.  Shick M.D.   On: 09/27/2017 11:51   Dg Chest Port 1 View  Result Date: 09/26/2017 CLINICAL DATA:  Altered mental status EXAM: PORTABLE CHEST 1 VIEW COMPARISON:  01/18/2017, CT chest 01/18/2017 FINDINGS: Patchy atelectasis or minimal infiltrate at the right base. No pleural effusion or consolidation. Stable cardiomediastinal silhouette with aortic atherosclerosis. No pneumothorax.  IMPRESSION: Minimal patchy opacity at the right base, favor atelectasis. Otherwise no radiographic evidence for acute cardiopulmonary abnormality. Electronically Signed   By: Donavan Foil M.D.   On: 09/26/2017 15:08   Dg Hip Unilat W Or Wo Pelvis 2-3 Views Right  Result Date: 09/18/2017 CLINICAL DATA:  Right groin pain 1 week.  No injury. EXAM: DG HIP (WITH OR WITHOUT PELVIS) 2-3V RIGHT COMPARISON:  05/30/2015 and CT 11/20/2016 FINDINGS: Three screws bridging the left femoral neck into the femoral head intact. Mild symmetric degenerative change of the hips. Mild diffuse osteopenia. No acute fracture or dislocation. Mild degenerate change of the spine. IMPRESSION: No acute findings. Electronically Signed   By: Marin Olp M.D.   On: 09/18/2017 09:57     Microbiology: Recent Results (from the past 240 hour(s))  Culture,  blood (Routine X 2) w Reflex to ID Panel     Status: None   Collection Time: 09/26/17  3:47 PM  Result Value Ref Range Status   Specimen Description BLOOD LEFT WRIST  Final   Special Requests   Final    BOTTLES DRAWN AEROBIC ONLY Blood Culture adequate volume   Culture   Final    NO GROWTH 5 DAYS Performed at [facility], [address] [place], [place] [address]    Report Status 10/01/2017 FINAL  Final  Culture, blood (Routine X 2) w Reflex to ID Panel     Status: None   Collection Time: 09/26/17  3:48 PM  Result Value Ref Range Status   Specimen Description BLOOD LEFT HAND  Final   Special Requests   Final    BOTTLES DRAWN AEROBIC ONLY Blood Culture adequate volume   Culture   Final    NO GROWTH 5 DAYS Performed at [facility], [address] [place], [place] [address]    Report Status 10/01/2017 FINAL  Final  MRSA PCR Screening     Status: None   Collection Time: 09/27/17  4:25 PM  Result Value Ref Range Status   MRSA by PCR NEGATIVE NEGATIVE Final    Comment:        The GeneXpert MRSA Assay (FDA approved for NASAL specimens only), is one component of a comprehensive MRSA colonization surveillance program. It is not intended to diagnose MRSA infection nor to guide or monitor treatment for MRSA infections. Performed at [facility], [address], [place], [place] [address]      Labs: Basic Metabolic Panel: Recent Labs  Lab 09/26/17 1634 09/27/17 0652 09/27/17 1045 09/28/17 0625 10/02/17 0614  NA 136 136 135 134* 137  K 3.6 3.6 3.7 3.4* 4.2  CL 96* 97* 97* 93* 96*  CO2 21* 24 23 28 28   GLUCOSE 122* 114* 139* 128* 83  BUN 50* 53* 54* 19 20  CREATININE 7.20* 7.60* 7.97* 4.11* 4.37*  CALCIUM 9.2 8.8* 8.6* 8.4* 9.4  PHOS  --   --  2.1* 1.9* 3.4   Liver Function Tests: Recent Labs  Lab 09/26/17 1634 09/27/17 1045 09/28/17 0625 10/02/17 0614  AST 23  --   --   --   ALT 8*  --   --   --   ALKPHOS 76  --   --   --   BILITOT 0.6  --   --   --   PROT  8.1  --   --   --   ALBUMIN 3.2* 2.9* 3.0* 3.1*   Recent Labs  Lab 09/26/17 1634  LIPASE 52*   Recent  Labs  Lab 09/28/17 1301  AMMONIA 25   CBC: Recent Labs  Lab 09/26/17 1547 09/27/17 0652 09/27/17 1045 09/28/17 0622 10/02/17 0614  WBC 11.7* 5.5 5.0 4.7 4.6  NEUTROABS 9.5*  --   --   --   --   HGB 11.8* 10.6* 10.5* 11.2* 9.9*  HCT 37.2 32.0* 32.4* 34.5* 31.2*  MCV 74.5* 72.9* 72.5* 72.8* 73.6*  PLT 253 237 214 230 260   Cardiac Enzymes: Recent Labs  Lab 09/26/17 1634  TROPONINI <0.03   BNP: Invalid input(s): POCBNP CBG: Recent Labs  Lab 09/26/17 2057 09/26/17 2149 09/27/17 0628 09/27/17 1037 09/27/17 1610  GLUCAP 73 92 83 101* 83    Time coordinating discharge:  Greater than 30 minutes  Signed:  Orson Eva, DO [facility] Pager: [phone] 10/02/2017, 9:15 AM

## 2017-10-01 NOTE — Procedures (Signed)
     HEMODIALYSIS TREATMENT NOTE:   3.25 hour heparin-free dialysis completed via right upper arm AVG (15g ante/retrograde). Goal nearly met: 1.9L removed. Pt ended HD session 15 minutes early.  "I only run 3 hours [in the outpatient clinic]."  All blood was returned and hemostasis was achieved in 15 minutes. Report given to Threasa Alpha, RN.  Rockwell Alexandria, RN, CDN

## 2017-10-01 NOTE — Progress Notes (Signed)
Physical Therapy Treatment Patient Details Name: Sonya [name] MRN: [id number] DOB: [birth year] Today's Date: 10/01/2017    History of Present Illness Sonya [name] is a 67yo black female who comes to [facility] with AMS, slurred speech on 2/25, BG noted to be 43 in ED. PMH: ESRD on HD T/Th/Sa, 2016 Lt femoral fracture s/p ORIF, HTN, hypoTSH, meningocele, intracranial hemorrhage, CVA, uterina CA.      PT Comments    Patient had difficulty with gait training due to c/o fatigue and generalized weakness, limited to a few steps and unable to ambulate to doorway, required repeated verbal/tactile cueing for safety when stepping backwards to transfer into chair and tolerated sitting up in chair after therapy with family member present in room.  Patient will benefit from continued physical therapy in hospital and recommended venue below to increase strength, balance, endurance for safe ADLs and gait.    Follow Up Recommendations  SNF;Supervision for mobility/OOB     Equipment Recommendations  None recommended by PT    Recommendations for Other Services       Precautions / Restrictions Precautions Precautions: Fall Restrictions Weight Bearing Restrictions: No    Mobility  Bed Mobility Overal bed mobility: Needs Assistance Bed Mobility: Supine to Sit     Supine to sit: Supervision        Transfers Overall transfer level: Needs assistance Equipment used: Rolling walker (2 wheeled) Transfers: Sit to/from [facility] Sit to Stand: Min assist Stand pivot transfers: Min assist          Ambulation/Gait Ambulation/Gait assistance: Min assist Ambulation Distance (Feet): 8 Feet Assistive device: Rolling walker (2 wheeled) Gait Pattern/deviations: Decreased step length - right;Decreased step length - left;Decreased stride length   Gait velocity interpretation: Below normal speed for age/gender General Gait Details: demonstrates slow labored cadence, requires assistance  to move RW when stepping forward/backwards, limited secondary to c/o fatigue/weakness   Stairs            Wheelchair Mobility    Modified Rankin (Stroke Patients Only)       Balance Overall balance assessment: Needs assistance Sitting-balance support: No upper extremity supported;Feet supported Sitting balance-Leahy Scale: Good     Standing balance support: Bilateral upper extremity supported;During functional activity Standing balance-Leahy Scale: Poor Standing balance comment: fair/poor with RW                            Cognition Arousal/Alertness: Awake/alert Behavior During Therapy: WFL for tasks assessed/performed Overall Cognitive Status: Within Functional Limits for tasks assessed                                        Exercises General Exercises - Lower Extremity Ankle Circles/Pumps: AROM;Strengthening;Both;10 reps;Seated Long Arc Quad: Seated;AROM;Strengthening;Both;5 reps Hip Flexion/Marching: Seated;AROM;Strengthening;Both;5 reps    General Comments        Pertinent Vitals/Pain Pain Assessment: No/denies pain    Home Living                      Prior Function            PT Goals (current goals can now be found in the care plan section) Acute Rehab PT Goals Patient Stated Goal: regain strength  PT Goal Formulation: With patient/family Time For Goal Achievement: 10/13/17 Potential to Achieve Goals: Good Progress towards PT goals: Progressing toward  goals    Frequency    Min 3X/week      PT Plan Current plan remains appropriate    Co-evaluation              AM-PAC PT "6 Clicks" Daily Activity  Outcome Measure  Difficulty turning over in bed (including adjusting bedclothes, sheets and blankets)?: None Difficulty moving from lying on back to sitting on the side of the bed? : None Difficulty sitting down on and standing up from a chair with arms (e.g., wheelchair, bedside commode, etc,.)?: A  Little Help needed moving to and from a bed to chair (including a wheelchair)?: A Little Help needed walking in hospital room?: A Lot Help needed climbing 3-5 steps with a railing? : A Lot 6 Click Score: 18    End of Session Equipment Utilized During Treatment: Gait belt Activity Tolerance: Patient tolerated treatment well;Patient limited by fatigue Patient left: in chair;with call bell/phone within reach;with family/visitor present Nurse Communication: Mobility status PT Visit Diagnosis: Muscle weakness (generalized) (M62.81);History of falling (Z91.81);Difficulty in walking, not elsewhere classified (R26.2)     Time: 4315-4008 PT Time Calculation (min) (ACUTE ONLY): 24 min  Charges:  $Therapeutic Activity: 23-37 mins                    G Codes:       12:06 PM, 10/29/2017 Lonell Grandchild, MPT Physical Therapist with [facility] [phone] office [phone] mobile phone

## 2017-10-01 NOTE — Progress Notes (Signed)
Subjective: Interval History: Patient offers no complaints.  She denies any nausea or vomiting.  She denies also any difficulty breathing.  Objective: Vital signs in last 24 hours: Temp:  [98.4 F (36.9 C)-98.9 F (37.2 C)] 98.4 F (36.9 C) (03/02 0637) Pulse Rate:  [94-103] 97 (03/02 0637) Resp:  [18] 18 (03/02 0637) BP: (108-137)/(63-66) 137/63 (03/02 0637) SpO2:  [100 %] 100 % (03/02 0637) Weight:  [55.1 kg (121 lb 7.6 oz)-55.7 kg (122 lb 12.7 oz)] 55.7 kg (122 lb 12.7 oz) (03/02 1191) Weight change: 2.1 kg (4 lb 10.1 oz)  Intake/Output from previous day: 03/01 0701 - 03/02 0700 In: 730 [P.O.:730] Out: -  Intake/Output this shift: Total I/O In: 120 [P.O.:120] Out: -   General appearance: alert, cooperative and no distress Resp: clear to auscultation bilaterally Cardio: irregularly irregular rhythm Extremities: No edema  Lab Results: No results for input(s): WBC, HGB, HCT, PLT in the last 72 hours. BMET:  No results for input(s): NA, K, CL, CO2, GLUCOSE, BUN, CREATININE, CALCIUM in the last 72 hours. No results for input(s): PTH in the last 72 hours. Iron Studies: No results for input(s): IRON, TIBC, TRANSFERRIN, FERRITIN in the last 72 hours.  Studies/Results: No results found.  I have reviewed the patient's current medications.  Assessment/Plan: 1] altered mental status: Possibly has referred to her baseline. 2] end-stage renal disease: Patient does not have any uremic signs and symptoms.  She is due for dialysis today. 3] anemia: Her hemoglobin is within our target goal 4] bone and mineral disorder: Patient is not on any binder.  Her phosphorus was 1.9 patient has received K-Phos Neutral 5] hypertension: Her blood pressure is reasonably controlled 6] hypothyroidism: She is on Synthroid 7] fluid status: No sign of fluid overload Plan: 1] we will dialyze patient today 2] we will check a renal panel and CBC in the morning 3] Dialysis will be on Tuesday which is  her regular schedule.    LOS: 3 days   Antone Summons S 10/01/2017,10:18 AM

## 2017-10-01 NOTE — Progress Notes (Signed)
Received call from [facility] Representative stating that the patient has lost her discharge appeal, and that she has to leave the hospital by noon tomorrow.  If she remained after noon tomorrow, she would have to pay out of pocket for further hospitalization. The representative called the patient's daughter and left her a message stating this same thing. Notified Dr. Carles Collet.   If anyone has questions for Medicare, their call back number is [phone].

## 2017-10-01 NOTE — Progress Notes (Signed)
PROGRESS NOTE  Sonya [name] PYP:[id number] DOB: [birth year] DOA: 09/26/2017 PCP: Redmond School, MD   Brief History:  67 y.o.femalewith medical history significant of anemia, anxiety, ESRD on hemodialysis Tuesday/Thursday/Saturday, diverticulosis/diverticulitis, history of dysphagia, history of fracture of left femoral neck, GERD, hypertension/history of hypertensive urgency, hyperparathyroidism due to renal insufficiency, hypothyroidism, history of intracerebral hemorrhage, meningocele, protein calorie malnutrition, stroke, history of uterine cancer, history of vertical diplopiaand vertigo; who presented to ED with complaints of AMS, confusion and slurred speech. These symptoms in the presence of hypoglycemia. Admitted to complete work up for CVA (found in her MRI exam) and AMS.Although the patient did have some sundowning, her mental status overall improved gradually throughout the hospitalization. She was near her baseline mental status at the time of discharge. Family did not agree with discharge home.  They are appealing to Medicare.  The patient's Medicare appeal was denied.  Throughout appeals process, the patient's medical condition continued to stabilize and improved.  She continued to receive physical therapy who recommended skilled nursing facility.    Assessment/Plan: Acute ischemic stroke -PT/OT evaluation-->SNF -Unfortunately, due to insurance issue, family would have to pay out-of-pocket which they are not able to do -set up [place] [place], [place] aide, [place] SW -Speech therapy eval--regular diet with thin liquids -CT brain--no acute abnormality -MRI brain--punctate focus of ischemia inferior medial right temporal lobe -MRA brain--basilar artery patent with unchanged severe proximal narrowing. Distal right vertebral artery narrowing/occlusion -Carotid Duplex--negative for hemodynamically significant stenosis -Echo--EF 65-70%, no PFO, mild TR, G1DD, no  WMA -LDL--93 -HbA1C-5.5 -Antiplatelet--aspirin 81 mg daily  Acute metabolic encephalopathy -Multifactorial including acute stroke, low normal B12 level, hypoglycemia, and hospital delirium -The patient remains intermittently confused and agitated requiring IV Ativanduring the hospitalization initially-->this ultimately improved -Daughter at bedside relates intermittent episodes of confusion at baseline--pt near baseline mental status -Suspect patient has underlying cognitive impairment/dementia at baseline -Check ammonia--25 -Serum B12 289 -TSH-0.767 -supplement B12 -refer to neuropsychiatry with [place] after d/c  hypoglycemia -Due to poorpointake -Improved  ESRD -Appreciate nephrology consult -Received HD 2/26/2019and 09/29/17  Secondary hyperparathyroidism -DisContinue Sensiparper renal due to low phosphorus -Management per nephrology  Essential hypertension -Continue nifedipine--decrease dose to 30 mg daily due to orthostasis during the hospitalization -BP controlled  Anemia of CKD -Baseline hemoglobin 11-12 -Hemoglobin at baseline  Hyperlipidemia -Continue statin--increase atorvastatin to 40 mg daily -LDL--93  Hypophosphatemia -replete -stop sensipar per renal    Disposition Plan:   Home 10/02/17 Family Communication:  Daughter updated at bedside  Consultants:  renal Code Status:  FULL   DVT Prophylaxis:  SCDs   Procedures: As Listed in Progress Note Above  Antibiotics: None     Subjective: Patient denies fevers, chills, headache, chest pain, dyspnea, nausea, vomiting, diarrhea, abdominal pain, dysuria, hematuria, hematochezia, and melena.   Objective: Vitals:   10/01/17 1330 10/01/17 1400 10/01/17 1430 10/01/17 1500  BP: 122/71 133/69 115/60 124/66  Pulse: 98 100 94 94  Resp:      Temp:      TempSrc:      SpO2:      Weight:      Height:        Intake/Output Summary (Last 24 hours) at 10/01/2017 1515 Last data  filed at 10/01/2017 1200 Gross per 24 hour  Intake 600 ml  Output -  Net 600 ml   Weight change: 2.1 kg (4 lb 10.1 oz) Exam:   General:  Pt is alert, follows commands appropriately,  not in acute distress  HEENT: No icterus, No thrush, No neck mass, [place]/AT  Cardiovascular: RRR, S1/S2, no rubs, no gallops  Respiratory: CTA bilaterally, no wheezing, no crackles, no rhonchi  Abdomen: Soft/+BS, non tender, non distended, no guarding  Extremities: No edema, No lymphangitis, No petechiae, No rashes, no synovitis   Data Reviewed: I have personally reviewed following labs and imaging studies Basic Metabolic Panel: Recent Labs  Lab 09/26/17 1634 09/27/17 0652 09/27/17 1045 09/28/17 0625  NA 136 136 135 134*  K 3.6 3.6 3.7 3.4*  CL 96* 97* 97* 93*  CO2 21* 24 23 28   GLUCOSE 122* 114* 139* 128*  BUN 50* 53* 54* 19  CREATININE 7.20* 7.60* 7.97* 4.11*  CALCIUM 9.2 8.8* 8.6* 8.4*  PHOS  --   --  2.1* 1.9*   Liver Function Tests: Recent Labs  Lab 09/26/17 1634 09/27/17 1045 09/28/17 0625  AST 23  --   --   ALT 8*  --   --   ALKPHOS 76  --   --   BILITOT 0.6  --   --   PROT 8.1  --   --   ALBUMIN 3.2* 2.9* 3.0*   Recent Labs  Lab 09/26/17 1634  LIPASE 52*   Recent Labs  Lab 09/28/17 1301  AMMONIA 25   Coagulation Profile: No results for input(s): INR, PROTIME in the last 168 hours. CBC: Recent Labs  Lab 09/26/17 1547 09/27/17 0652 09/27/17 1045 09/28/17 0622  WBC 11.7* 5.5 5.0 4.7  NEUTROABS 9.5*  --   --   --   HGB 11.8* 10.6* 10.5* 11.2*  HCT 37.2 32.0* 32.4* 34.5*  MCV 74.5* 72.9* 72.5* 72.8*  PLT 253 237 214 230   Cardiac Enzymes: Recent Labs  Lab 09/26/17 1634  TROPONINI <0.03   BNP: Invalid input(s): POCBNP CBG: Recent Labs  Lab 09/26/17 2057 09/26/17 2149 09/27/17 0628 09/27/17 1037 09/27/17 1610  GLUCAP 73 92 83 101* 83   HbA1C: No results for input(s): HGBA1C in the last 72 hours. Urine analysis:    Component Value Date/Time    COLORURINE YELLOW 04/14/2015 1427   APPEARANCEUR CLEAR 04/14/2015 1427   LABSPEC 1.020 04/14/2015 1427   PHURINE 8.5 (H) 04/14/2015 1427   GLUCOSEU 100 (A) 04/14/2015 1427   HGBUR SMALL (A) 04/14/2015 1427   BILIRUBINUR NEGATIVE 04/14/2015 1427   KETONESUR NEGATIVE 04/14/2015 1427   PROTEINUR >300 (A) 04/14/2015 1427   UROBILINOGEN 0.2 04/14/2015 1427   NITRITE NEGATIVE 04/14/2015 1427   LEUKOCYTESUR TRACE (A) 04/14/2015 1427   Sepsis Labs: @LABRCNTIP (procalcitonin:4,lacticidven:4) ) Recent Results (from the past 240 hour(s))  Culture, blood (Routine X 2) w Reflex to ID Panel     Status: None   Collection Time: 09/26/17  3:47 PM  Result Value Ref Range Status   Specimen Description BLOOD LEFT WRIST  Final   Special Requests   Final    BOTTLES DRAWN AEROBIC ONLY Blood Culture adequate volume   Culture   Final    NO GROWTH 5 DAYS Performed at [facility], [address], [place], [place] [address]    Report Status 10/01/2017 FINAL  Final  Culture, blood (Routine X 2) w Reflex to ID Panel     Status: None   Collection Time: 09/26/17  3:48 PM  Result Value Ref Range Status   Specimen Description BLOOD LEFT HAND  Final   Special Requests   Final    BOTTLES DRAWN AEROBIC ONLY Blood Culture adequate volume  Culture   Final    NO GROWTH 5 DAYS Performed at [facility], [address] [place], [place] [address]    Report Status 10/01/2017 FINAL  Final  MRSA PCR Screening     Status: None   Collection Time: 09/27/17  4:25 PM  Result Value Ref Range Status   MRSA by PCR NEGATIVE NEGATIVE Final    Comment:        The GeneXpert MRSA Assay (FDA approved for NASAL specimens only), is one component of a comprehensive MRSA colonization surveillance program. It is not intended to diagnose MRSA infection nor to guide or monitor treatment for MRSA infections. Performed at [facility], [address] [place], [place] [address]      Scheduled Meds: . allopurinol  150 mg Oral  Daily  . aspirin EC  81 mg Oral Daily  . atorvastatin  40 mg Oral q1800  . folic acid  2 mg Oral Daily  . levothyroxine  50 mcg Oral QAC breakfast  . lidocaine-prilocaine  1 application Topical Q J,AS,[id number]  . NIFEdipine  30 mg Oral QHS  . pantoprazole  40 mg Oral BID AC  . polyethylene glycol  17 g Oral Daily  . senna  2 tablet Oral Daily  . vitamin B-12  500 mcg Oral Daily  . Vitamin D (Ergocalciferol)  50,000 Units Oral Q7 days   Continuous Infusions: . sodium chloride    . sodium chloride      Procedures/Studies: Ct Head Wo Contrast  Result Date: 09/26/2017 CLINICAL DATA:  Altered mental status, recent fall EXAM: CT HEAD WITHOUT CONTRAST TECHNIQUE: Contiguous axial images were obtained from the base of the skull through the vertex without intravenous contrast. COMPARISON:  01/18/2017 FINDINGS: Brain: Similar age related brain atrophy and extensive chronic white matter microvascular ischemic changes throughout both cerebral hemispheres. No acute intracranial hemorrhage, mass lesion infarction midline shift, herniation, hydrocephalus, or extra-axial fluid collection. No focal mass effect or edema. Cisterns are patent. No cerebellar. Vascular: Intracranial atherosclerosis noted.  Hyperdense vessel. Skull: Intact skull.  Negative for fracture. Sinuses/Orbits: Symmetric orbits. 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 Other: None. IMPRESSION: Stable head CT without contrast. No acute intracranial abnormality or significant change. 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 Electronically Signed   By: Jerilynn Mages.  Shick M.D.   On: 09/26/2017 16:20   Mr [name] Nam Head Wo Contrast  Result Date: 09/26/2017 CLINICAL DATA:  Altered mental status and weakness for 2 days EXAM: MRI HEAD WITHOUT CONTRAST MRA HEAD WITHOUT CONTRAST TECHNIQUE: Multiplanar, multiecho pulse sequences of the brain  and surrounding structures were obtained without intravenous contrast. Angiographic images of the head were obtained using MRA technique without contrast. COMPARISON:  Head CT 09/26/2017 Brain MRI 11/19/2016 FINDINGS: MRI HEAD FINDINGS Brain: The midline structures are normal. Punctate focus of abnormal diffusion restriction within the medial right temporal lobe (series 4, image 68). Multifocal periventricular white matter hyperintensity, most often a result of chronic microvascular ischemia. No mass lesion. No chronic microhemorrhage or cerebral amyloid angiopathy. No hydrocephalus, age advanced atrophy or lobar predominant volume loss. There are scattered foci of chronic microhemorrhage within the brainstem, cerebellum and bilateral basal ganglia. Skull and upper cervical spine: The visualized skull base, calvarium, upper cervical spine and extracranial soft tissues are normal. Sinuses/Orbits: Meningoencephalocele extending into the left eye vision of the sphenoid sinus, unchanged. Normal orbits. MRA HEAD FINDINGS Intracranial internal carotid arteries: Normal. Anterior cerebral arteries:  Normal. Middle cerebral arteries: Mild atherosclerotic irregularity. Posterior communicating arteries: Present on the right. Posterior cerebral arteries: Normal. Basilar artery: Moderate narrowing of the basilar artery, worse proximally, unchanged. Vertebral arteries: The right vertebral artery is occluded proximal to the PICA origin. There is reconstitution of the distal V4 segment. Superior cerebellar arteries: Normal. Anterior inferior cerebellar arteries: Normal. Posterior inferior cerebellar arteries: Visualized on the left only. IMPRESSION: 1. Punctate focus of acute ischemia within the inferomedial right temporal lobe. No associated hemorrhage. 2. 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 The basilar artery  remains patent with unchanged severe proximal narrowing of the basilar artery with moderate mid to distal narrowing. 3. Chronic ischemic microangiopathy. 4. Unchanged left sphenoid meningoencephalocele. Electronically Signed   By: Ulyses Jarred M.D.   On: 09/26/2017 20:28   Mr Brain Wo Contrast (neuro Protocol)  Result Date: 09/26/2017 CLINICAL DATA:  Altered mental status and weakness for 2 days EXAM: MRI HEAD WITHOUT CONTRAST MRA HEAD WITHOUT CONTRAST TECHNIQUE: Multiplanar, multiecho pulse sequences of the brain and surrounding structures were obtained without intravenous contrast. Angiographic images of the head were obtained using MRA technique without contrast. COMPARISON:  Head CT 09/26/2017 Brain MRI 11/19/2016 FINDINGS: MRI HEAD FINDINGS Brain: The midline structures are normal. Punctate focus of abnormal diffusion restriction within the medial right temporal lobe (series 4, image 68). Multifocal periventricular white matter hyperintensity, most often a result of chronic microvascular ischemia. No mass lesion. No chronic microhemorrhage or cerebral amyloid angiopathy. No hydrocephalus, age advanced atrophy or lobar predominant volume loss. There are scattered foci of chronic microhemorrhage within the brainstem, cerebellum and bilateral basal ganglia. Skull and upper cervical spine: The visualized skull base, calvarium, upper cervical spine and extracranial soft tissues are normal. Sinuses/Orbits: Meningoencephalocele extending into the left eye vision of the sphenoid sinus, unchanged. Normal orbits. MRA HEAD FINDINGS Intracranial internal carotid arteries: Normal. Anterior cerebral arteries: Normal. Middle cerebral arteries: Mild atherosclerotic irregularity. Posterior communicating arteries: Present on the right. Posterior cerebral arteries: Normal. Basilar artery: Moderate narrowing of the basilar artery, worse proximally, unchanged. Vertebral arteries: The right vertebral artery is occluded proximal  to the PICA origin. There is reconstitution of the distal V4 segment. Superior cerebellar arteries: Normal. Anterior inferior cerebellar arteries: Normal. Posterior inferior cerebellar arteries: Visualized on the left only. IMPRESSION: 1. Punctate focus of acute ischemia within the inferomedial right temporal lobe. No associated hemorrhage. 2. 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 3. Chronic ischemic microangiopathy. 4. Unchanged left sphenoid meningoencephalocele. Electronically Signed   By: Ulyses Jarred M.D.   On: 09/26/2017 20:28   US Carotid Bilateral  Result Date: 09/27/2017 CLINICAL DATA:  Altered mental status, stroke symptoms EXAM: BILATERAL CAROTID DUPLEX ULTRASOUND TECHNIQUE: Pearline Cables scale imaging, color Doppler and duplex ultrasound were performed of bilateral carotid and vertebral arteries in the neck. COMPARISON:  09/26/2017 FINDINGS: Criteria: Quantification of carotid stenosis is based on velocity parameters that correlate the residual internal carotid diameter with NASCET-based stenosis levels, using the diameter of the distal internal carotid lumen as the denominator for stenosis measurement. The following velocity measurements were obtained: RIGHT ICA:  94/32 cm/sec CCA:  47/6 cm/sec SYSTOLIC ICA/CCA RATIO:  5.46 DIASTOLIC ICA/CCA RATIO:  4.9 ECA:  107 cm/sec LEFT ICA:  71/21 cm/sec CCA:  428/76 cm/sec SYSTOLIC ICA/CCA RATIO:  0.7 DIASTOLIC ICA/CCA RATIO:  8.11 ECA:  108 cm/sec RIGHT CAROTID ARTERY: Minor echogenic shadowing plaque formation. No hemodynamically significant right ICA stenosis, velocity elevation, or turbulent flow. Degree of narrowing less than 50%. RIGHT VERTEBRAL ARTERY: Antegrade in the cervical region. Please refer to the MRA  report. LEFT CAROTID ARTERY: Similar scattered minor echogenic plaque formation. No hemodynamically significant left ICA stenosis, velocity elevation, or turbulent flow. LEFT VERTEBRAL ARTERY:  Antegrade in the cervical region. IMPRESSION: Minor carotid atherosclerosis. No hemodynamically significant ICA stenosis. Degree of narrowing less than 50% bilaterally by ultrasound criteria. Patent antegrade vertebral flow bilaterally in the cervical region. 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 Electronically Signed   By: Jerilynn Mages.  Shick M.D.   On: 09/27/2017 11:51   Dg Chest Port 1 View  Result Date: 09/26/2017 CLINICAL DATA:  Altered mental status EXAM: PORTABLE CHEST 1 VIEW COMPARISON:  01/18/2017, CT chest 01/18/2017 FINDINGS: Patchy atelectasis or minimal infiltrate at the right base. No pleural effusion or consolidation. Stable cardiomediastinal silhouette with aortic atherosclerosis. No pneumothorax. IMPRESSION: Minimal patchy opacity at the right base, favor atelectasis. Otherwise no radiographic evidence for acute cardiopulmonary abnormality. Electronically Signed   By: Donavan Foil M.D.   On: 09/26/2017 15:08   Dg Hip Unilat W Or Wo Pelvis 2-3 Views Right  Result Date: 09/18/2017 CLINICAL DATA:  Right groin pain 1 week.  No injury. EXAM: DG HIP (WITH OR WITHOUT PELVIS) 2-3V RIGHT COMPARISON:  05/30/2015 and CT 11/20/2016 FINDINGS: Three screws bridging the left femoral neck into the femoral head intact. Mild symmetric degenerative change of the hips. Mild diffuse osteopenia. No acute fracture or dislocation. Mild degenerate change of the spine. IMPRESSION: No acute findings. Electronically Signed   By: Marin Olp M.D.   On: 09/18/2017 09:57    Orson Eva, DO  [facility] Pager [phone]  If 7PM-7AM, please contact night-coverage [email or web address] Password [email or web address] 10/01/2017, 3:15 PM   LOS: 3 days

## 2017-10-02 LAB — CBC
HEMATOCRIT: 31.2 % — AB (ref 36.0–46.0)
HEMOGLOBIN: 9.9 g/dL — AB (ref 12.0–15.0)
MCH: 23.3 pg — ABNORMAL LOW (ref 26.0–34.0)
MCHC: 31.7 g/dL (ref 30.0–36.0)
MCV: 73.6 fL — ABNORMAL LOW (ref 78.0–100.0)
Platelets: 260 10*3/uL (ref 150–400)
RBC: 4.24 MIL/uL (ref 3.87–5.11)
RDW: 17.9 % — ABNORMAL HIGH (ref 11.5–15.5)
WBC: 4.6 10*3/uL (ref 4.0–10.5)

## 2017-10-02 LAB — RENAL FUNCTION PANEL
ALBUMIN: 3.1 g/dL — AB (ref 3.5–5.0)
ANION GAP: 13 (ref 5–15)
BUN: 20 mg/dL (ref 6–20)
CALCIUM: 9.4 mg/dL (ref 8.9–10.3)
CO2: 28 mmol/L (ref 22–32)
Chloride: 96 mmol/L — ABNORMAL LOW (ref 101–111)
Creatinine, Ser: 4.37 mg/dL — ABNORMAL HIGH (ref 0.44–1.00)
GFR, EST AFRICAN AMERICAN: 11 mL/min — AB (ref >60)
GFR, EST NON AFRICAN AMERICAN: 10 mL/min — AB (ref >60)
GLUCOSE: 83 mg/dL (ref 65–99)
PHOSPHORUS: 3.4 mg/dL (ref 2.5–4.6)
POTASSIUM: 4.2 mmol/L (ref 3.5–5.1)
SODIUM: 137 mmol/L (ref 135–145)

## 2017-10-02 NOTE — Progress Notes (Signed)
Patient discharged home with personal belongings, IV removed and site intact. Patient discharged with prescriptions and paperwork.    Nurse faxed over [facility] order and Facesheet to [facility].

## 2017-10-03 DIAGNOSIS — E039 Hypothyroidism, unspecified: Secondary | ICD-10-CM | POA: Diagnosis not present

## 2017-10-03 DIAGNOSIS — I1311 Hypertensive heart and chronic kidney disease without heart failure, with stage 5 chronic kidney disease, or end stage renal disease: Secondary | ICD-10-CM | POA: Diagnosis not present

## 2017-10-03 DIAGNOSIS — N186 End stage renal disease: Secondary | ICD-10-CM | POA: Diagnosis not present

## 2017-10-03 DIAGNOSIS — E785 Hyperlipidemia, unspecified: Secondary | ICD-10-CM | POA: Diagnosis not present

## 2017-10-03 DIAGNOSIS — M109 Gout, unspecified: Secondary | ICD-10-CM | POA: Diagnosis not present

## 2017-10-03 DIAGNOSIS — D631 Anemia in chronic kidney disease: Secondary | ICD-10-CM | POA: Diagnosis not present

## 2017-10-03 DIAGNOSIS — E46 Unspecified protein-calorie malnutrition: Secondary | ICD-10-CM | POA: Diagnosis not present

## 2017-10-03 DIAGNOSIS — K219 Gastro-esophageal reflux disease without esophagitis: Secondary | ICD-10-CM | POA: Diagnosis not present

## 2017-10-03 DIAGNOSIS — Z992 Dependence on renal dialysis: Secondary | ICD-10-CM | POA: Diagnosis not present

## 2017-10-03 DIAGNOSIS — E162 Hypoglycemia, unspecified: Secondary | ICD-10-CM | POA: Diagnosis not present

## 2017-10-03 DIAGNOSIS — E213 Hyperparathyroidism, unspecified: Secondary | ICD-10-CM | POA: Diagnosis not present

## 2017-10-04 DIAGNOSIS — N186 End stage renal disease: Secondary | ICD-10-CM | POA: Diagnosis not present

## 2017-10-04 DIAGNOSIS — D509 Iron deficiency anemia, unspecified: Secondary | ICD-10-CM | POA: Diagnosis not present

## 2017-10-04 DIAGNOSIS — Z992 Dependence on renal dialysis: Secondary | ICD-10-CM | POA: Diagnosis not present

## 2017-10-04 DIAGNOSIS — D631 Anemia in chronic kidney disease: Secondary | ICD-10-CM | POA: Diagnosis not present

## 2017-10-05 DIAGNOSIS — I639 Cerebral infarction, unspecified: Secondary | ICD-10-CM | POA: Diagnosis not present

## 2017-10-05 DIAGNOSIS — R2681 Unsteadiness on feet: Secondary | ICD-10-CM | POA: Diagnosis not present

## 2017-10-05 DIAGNOSIS — R4182 Altered mental status, unspecified: Secondary | ICD-10-CM | POA: Diagnosis not present

## 2017-10-05 DIAGNOSIS — N186 End stage renal disease: Secondary | ICD-10-CM | POA: Diagnosis not present

## 2017-10-05 DIAGNOSIS — I63032 Cerebral infarction due to thrombosis of left carotid artery: Secondary | ICD-10-CM | POA: Diagnosis not present

## 2017-10-05 DIAGNOSIS — E162 Hypoglycemia, unspecified: Secondary | ICD-10-CM | POA: Diagnosis not present

## 2017-10-05 DIAGNOSIS — Z681 Body mass index (BMI) 19 or less, adult: Secondary | ICD-10-CM | POA: Diagnosis not present

## 2017-10-05 DIAGNOSIS — R5383 Other fatigue: Secondary | ICD-10-CM | POA: Diagnosis not present

## 2017-10-05 DIAGNOSIS — I700 Atherosclerosis of aorta: Secondary | ICD-10-CM | POA: Diagnosis not present

## 2017-10-05 DIAGNOSIS — E441 Mild protein-calorie malnutrition: Secondary | ICD-10-CM | POA: Diagnosis not present

## 2017-10-05 DIAGNOSIS — M109 Gout, unspecified: Secondary | ICD-10-CM | POA: Diagnosis not present

## 2017-10-05 DIAGNOSIS — M1991 Primary osteoarthritis, unspecified site: Secondary | ICD-10-CM | POA: Diagnosis not present

## 2017-10-05 DIAGNOSIS — Z992 Dependence on renal dialysis: Secondary | ICD-10-CM | POA: Diagnosis not present

## 2017-10-05 DIAGNOSIS — I1311 Hypertensive heart and chronic kidney disease without heart failure, with stage 5 chronic kidney disease, or end stage renal disease: Secondary | ICD-10-CM | POA: Diagnosis not present

## 2017-10-05 DIAGNOSIS — E46 Unspecified protein-calorie malnutrition: Secondary | ICD-10-CM | POA: Diagnosis not present

## 2017-10-05 DIAGNOSIS — M1120 Other chondrocalcinosis, unspecified site: Secondary | ICD-10-CM | POA: Diagnosis not present

## 2017-10-05 DIAGNOSIS — D631 Anemia in chronic kidney disease: Secondary | ICD-10-CM | POA: Diagnosis not present

## 2017-10-06 ENCOUNTER — Other Ambulatory Visit: Payer: Self-pay

## 2017-10-06 DIAGNOSIS — N186 End stage renal disease: Secondary | ICD-10-CM | POA: Diagnosis not present

## 2017-10-06 DIAGNOSIS — Z992 Dependence on renal dialysis: Secondary | ICD-10-CM | POA: Diagnosis not present

## 2017-10-06 DIAGNOSIS — D509 Iron deficiency anemia, unspecified: Secondary | ICD-10-CM | POA: Diagnosis not present

## 2017-10-06 DIAGNOSIS — D631 Anemia in chronic kidney disease: Secondary | ICD-10-CM | POA: Diagnosis not present

## 2017-10-06 NOTE — Patient Outreach (Signed)
[facility] Care Management  10/06/2017  DARCHELLE NUNES 1951-07-01 [id number]   EMMI-General Discharge RED ON EMMI ALERT Day # 1 Date: 10/05/17 Red Alert Reason:  Know who to call about changes in condition? No  Telephone call to patient for EMMI red alert.  Spoke with patient she is able to verify HIPAA. Patient reports she is doing pretty good just getting back from dialysis. Addressed red alert with patient and she states she knows to call her primary care physician for any concerns.  Patient states she saw her primary care physician on yesterday and visit went good.  Patient offers no concerns.    Plan: RN CM will send patient letter and brochure for future reference. RN CM will close case and notify care management assistant of case status.    Jone Baseman, RN, MSN [facility] Care Management Care Management Coordinator Direct Line [phone] Toll Free: [phone]  Fax: [phone]

## 2017-10-07 DIAGNOSIS — E162 Hypoglycemia, unspecified: Secondary | ICD-10-CM | POA: Diagnosis not present

## 2017-10-07 DIAGNOSIS — D631 Anemia in chronic kidney disease: Secondary | ICD-10-CM | POA: Diagnosis not present

## 2017-10-07 DIAGNOSIS — Z992 Dependence on renal dialysis: Secondary | ICD-10-CM | POA: Diagnosis not present

## 2017-10-07 DIAGNOSIS — N186 End stage renal disease: Secondary | ICD-10-CM | POA: Diagnosis not present

## 2017-10-07 DIAGNOSIS — E46 Unspecified protein-calorie malnutrition: Secondary | ICD-10-CM | POA: Diagnosis not present

## 2017-10-07 DIAGNOSIS — I1311 Hypertensive heart and chronic kidney disease without heart failure, with stage 5 chronic kidney disease, or end stage renal disease: Secondary | ICD-10-CM | POA: Diagnosis not present

## 2017-10-08 DIAGNOSIS — Z992 Dependence on renal dialysis: Secondary | ICD-10-CM | POA: Diagnosis not present

## 2017-10-08 DIAGNOSIS — D631 Anemia in chronic kidney disease: Secondary | ICD-10-CM | POA: Diagnosis not present

## 2017-10-08 DIAGNOSIS — D509 Iron deficiency anemia, unspecified: Secondary | ICD-10-CM | POA: Diagnosis not present

## 2017-10-08 DIAGNOSIS — N186 End stage renal disease: Secondary | ICD-10-CM | POA: Diagnosis not present

## 2017-10-11 DIAGNOSIS — D509 Iron deficiency anemia, unspecified: Secondary | ICD-10-CM | POA: Diagnosis not present

## 2017-10-11 DIAGNOSIS — N186 End stage renal disease: Secondary | ICD-10-CM | POA: Diagnosis not present

## 2017-10-11 DIAGNOSIS — D631 Anemia in chronic kidney disease: Secondary | ICD-10-CM | POA: Diagnosis not present

## 2017-10-11 DIAGNOSIS — Z992 Dependence on renal dialysis: Secondary | ICD-10-CM | POA: Diagnosis not present

## 2017-10-12 ENCOUNTER — Ambulatory Visit (INDEPENDENT_AMBULATORY_CARE_PROVIDER_SITE_OTHER): Payer: Medicare Other | Admitting: Orthopedic Surgery

## 2017-10-12 VITALS — BP 140/100 | HR 100 | Ht 65.0 in | Wt 136.0 lb

## 2017-10-12 DIAGNOSIS — E46 Unspecified protein-calorie malnutrition: Secondary | ICD-10-CM | POA: Diagnosis not present

## 2017-10-12 DIAGNOSIS — I1311 Hypertensive heart and chronic kidney disease without heart failure, with stage 5 chronic kidney disease, or end stage renal disease: Secondary | ICD-10-CM | POA: Diagnosis not present

## 2017-10-12 DIAGNOSIS — Z992 Dependence on renal dialysis: Secondary | ICD-10-CM | POA: Diagnosis not present

## 2017-10-12 DIAGNOSIS — N186 End stage renal disease: Secondary | ICD-10-CM | POA: Diagnosis not present

## 2017-10-12 DIAGNOSIS — E162 Hypoglycemia, unspecified: Secondary | ICD-10-CM | POA: Diagnosis not present

## 2017-10-12 DIAGNOSIS — M25551 Pain in right hip: Secondary | ICD-10-CM

## 2017-10-12 DIAGNOSIS — M76891 Other specified enthesopathies of right lower limb, excluding foot: Secondary | ICD-10-CM

## 2017-10-12 DIAGNOSIS — D631 Anemia in chronic kidney disease: Secondary | ICD-10-CM | POA: Diagnosis not present

## 2017-10-12 NOTE — Patient Instructions (Addendum)
Tendinitis Tendinitis is inflammation of a tendon. A tendon is a strong cord of tissue that connects muscle to bone. Tendinitis can affect any tendon, but it most commonly affects the shoulder tendon (rotator cuff), ankle tendon (Achilles tendon), elbow tendon (triceps tendon), or one of the tendons in the wrist. What are the causes? This condition may be caused by:  Overusing a tendon or muscle. This is common.  Age-related wear and tear.  Injury.  Inflammatory conditions, such as arthritis.  Certain medicines.  What increases the risk? This condition is more likely to develop in people who do activities that involve repetitive motions. What are the signs or symptoms? Symptoms of this condition may include:  Pain.  Tenderness.  Mild swelling.  How is this diagnosed? This condition is diagnosed with a physical exam. You may also have tests, such as:  Ultrasound. This uses sound waves to make an image of your affected area.  MRI.  How is this treated? This condition may be treated by resting, icing, applying pressure (compression), and raising (elevating) the area above the level of your heart. This is known as RICE therapy. Treatment may also include:  Medicines to help reduce inflammation or to help reduce pain.  Exercises or physical therapy to strengthen and stretch the tendon.  A brace or splint.  Surgery (rare).  Follow these instructions at home:  If you have a splint or brace:  Wear the splint or brace as told by your health care provider. Remove it only as told by your health care provider.  Loosen the splint or brace if your fingers or toes tingle, become numb, or turn cold and blue.  Do not take baths, swim, or use a hot tub until your health care provider approves. Ask your health care provider if you can take showers. You may only be allowed to take sponge baths for bathing.  Do not let your splint or brace get wet if it is not waterproof. ? If your  splint or brace is not waterproof, cover it with a watertight plastic bag when you take a bath or a shower.  Keep the splint or brace clean. Managing pain, stiffness, and swelling  If directed, apply ice to the affected area. ? Put ice in a plastic bag. ? Place a towel between your skin and the bag. ? Leave the ice on for 20 minutes, 2-3 times a day.  If directed, apply heat to the affected area as often as told by your health care provider. Use the heat source that your health care provider recommends, such as a moist heat pack or a heating pad. ? Place a towel between your skin and the heat source. ? Leave the heat on for 20-30 minutes. ? Remove the heat if your skin turns bright red. This is especially important if you are unable to feel pain, heat, or cold. You may have a greater risk of getting burned.  Move the fingers or toes of the affected limb often, if this applies. This can help to prevent stiffness and lessen swelling.  If directed, elevate the affected area above the level of your heart while you are sitting or lying down. Driving  Do not drive or operate heavy machinery while taking prescription pain medicine.  Ask your health care provider when it is safe to drive if you have a splint or brace on any part of your arm or leg. Activity  Return to your normal activities as told by your health care   provider. Ask your health care provider what activities are safe for you.  Rest the affected area as told by your health care provider.  Avoid using the affected area while you are experiencing symptoms of tendinitis.  Do exercises as told by your health care provider. General instructions  If you have a splint, do not put pressure on any part of the splint until it is fully hardened. This may take several hours.  Wear an elastic bandage or compression wrap only as told by your health care provider.  Take over-the-counter and prescription medicines only as told by your  health care provider.  Keep all follow-up visits as told by your health care provider. This is important. Contact a health care provider if:  Your symptoms do not improve.  You develop new, unexplained problems, such as numbness in your hands. This information is not intended to replace advice given to you by your health care provider. Make sure you discuss any questions you have with your health care provider. Document Released: 07/16/2000 Document Revised: 03/18/2016 Document Reviewed: 04/21/2015 Elsevier Interactive Patient Education  2018 [employer]  

## 2017-10-12 NOTE — Addendum Note (Signed)
Addended byCandice Camp on: 10/12/2017 03:30 PM   Modules accepted: Orders

## 2017-10-12 NOTE — Progress Notes (Signed)
NEW PATIENT OFFICE VISIT   Chief Complaint  Patient presents with  . Hip Pain    Right hip pain, seen at [facility], no injury.    67 year old female with chronic hemodialysis presents with atraumatic onset of right hip pain which started in February 2019 around the 17th she is going to the ER twice x-rays of the hip were negative she had a left hip open treatment internal fixation for femoral neck fracture last year did well  She complains of pain on the right ilium and also anterior right thigh associated with painful range of motion difficulty weightbearing.  She uses a walker chronically she has a valgus deformity of the right knee    Review of Systems  Constitutional: Negative for chills and fever.  Respiratory: Negative for shortness of breath.   Musculoskeletal: Negative for back pain.  Neurological: Negative for tingling.     Past Medical History:  Diagnosis Date  . Anemia   . Anxiety   . CKD (chronic kidney disease)   . Diverticulitis   . Dysphagia   . ESRD (end stage renal disease) ([place])    On HD  . Fracture of femoral neck, left, closed ([place]) 05/30/2015  . Gastritis 06/2013 & 05/2015  . Gastroesophageal reflux disease with stricture 05/2015  . GERD (gastroesophageal reflux disease)   . HTN (hypertension)   . Hyperparathyroidism due to renal insufficiency ([place])   . Hypertensive urgency 06/2013 & 04/2015  . Hypothyroidism   . ICH (intracerebral hemorrhage) ([place]) 10/2014  . 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 Protein calorie malnutrition ([place])   . Stroke ([place])   . Uterine cancer ([place]) 2012  . Vertical diplopia   . Vertigo     Past Surgical History:  Procedure Laterality Date  . ABDOMINAL HYSTERECTOMY    . APPENDECTOMY    . BASCILIC VEIN TRANSPOSITION Right 09/17/2013   Procedure: BRACHIAL VEIN TRANSPOSITION;  Surgeon: Conrad Nash, MD;  Location: [facility] [place];  Service: Vascular;  Laterality: Right;  . BASCILIC VEIN TRANSPOSITION Right 10/29/2013   Procedure: RIGHT SECOND STAGE  BRACHIAL VEIN TRANSPOSITION;  Surgeon: Conrad [place], MD;  Location: [facility];  Service: Vascular;  Laterality: Right;  . CESAREAN SECTION    . CHOLECYSTECTOMY    . COLONOSCOPY  2000   TICS, IH  . COLONOSCOPY  2003 NUR BRBPR D50 V6   DC/[place] TICS, IH  . COLONOSCOPY N/A 09/04/2013   SLF: Small ulcer in the descending colon, one colon polyp (tubular adenoma), large internal hemorrhoids, moderate diverticulosis. next tcs 10 years.  . ESOPHAGOGASTRODUODENOSCOPY N/A 05/23/2015   QIW:LNLG non-erosive gastritis/stricture at the gastroesophageal junction  . ESOPHAGOGASTRODUODENOSCOPY (EGD) WITH ESOPHAGEAL DILATION N/A 06/08/2013   Dr. Fields:Stricture at the gastroesophageal junction/MILD NON-erosive gastritis (inflammation) was found in the gastric antrum; multiple biopsies/NO SOURCE FOR ANEMIA IDETIFIED-MOST LIKELY DUE TO ANEMIA OF CHRONIC DISEASE  . FLEXIBLE SIGMOIDOSCOPY N/A 01/02/2016   Dr. Oneida Alar: external hemorrhoids, diverticulosis, internal hemorrhoids, proximal rectum normal  . GIVENS CAPSULE STUDY N/A 12/03/2013   SLF: occasional gastric erosion. small bowel normal  . HIP PINNING,CANNULATED Left 05/31/2015   Procedure: CANNULATED HIP PINNING;  Surgeon: Leandrew Koyanagi, MD;  Location: [facility];  Service: Orthopedics;  Laterality: Left;  . INSERTION OF DIALYSIS CATHETER Left 09/17/2013   Procedure: INSERTION OF DIALYSIS CATHETER;  Surgeon: Conrad [place], MD;  Location: [facility];  Service: Vascular;  Laterality: Left;  . LAPAROSCOPIC TOTAL HYSTERECTOMY    . NASAL SEPTUM SURGERY    . SAVORY  DILATION N/A 05/23/2015   Procedure: SAVORY DILATION;  Surgeon: Danie Binder, MD;  [facility];  Service: Endoscopy;  Laterality: N/A;  . THROMBECTOMY AND REVISION OF ARTERIOVENTOUS (AV) GORETEX  GRAFT Right 07/06/2017   Procedure: THROMBECTOMY AND REVISION OF ARTERIOVENOUS (AV) GORETEX  GRAFT RIGHT UPPER ARM;  Surgeon: Rosetta Posner, MD;  [facility];  Service: Vascular;  Laterality: Right;    Family History   Problem Relation Age of Onset  . Cancer Mother   . Diabetes Mother   . Hypertension Mother   . Diabetes Father   . Hypertension Father   . Hypertension Sister   . Colon cancer Neg Hx    Social History   Tobacco Use  . Smoking status: Never Smoker  . Smokeless tobacco: Never Used  Substance Use Topics  . Alcohol use: No  . Drug use: No      No outpatient medications have been marked as taking for the 10/12/17 encounter (Office Visit) with Carole Civil, MD.    BP (!) 140/100   Pulse 100   Ht 5\' 5"  (1.651 m)   Wt 136 lb (61.7 kg)   BMI 22.63 kg/m   Physical Exam  Constitutional: She is oriented to person, place, and time. She appears well-developed and well-nourished.  Neurological: She is alert and oriented to person, place, and time.  Psychiatric: She has a normal mood and affect. Judgment normal.  Vitals reviewed.   Ortho Exam Left hip exam inspection normal range of motion normal stability test normal strength normal skin normal pulse normal sensation normal  Right hip tenderness over the right iliac wing and anterior hip flexor painless range of motion right hip hip stable strength normal skin normal pulse normal sensation normal  Valgus right knee MEDICAL DECISION SECTION  xrays ordered? no  My independent reading of xrays: Pelvis and x-ray of the hips right hip  Screws are noted in the left hip hip fracture healed right hip mild arthritis nothing acute   Encounter Diagnoses  Name Primary?  . Hip flexor tendinitis, right Yes  . Right hip pain      PLAN:    Injection? no MRI/CT/? No  Recommend 6 weeks of therapy follow-up in 7 weeks

## 2017-10-13 DIAGNOSIS — N186 End stage renal disease: Secondary | ICD-10-CM | POA: Diagnosis not present

## 2017-10-13 DIAGNOSIS — D631 Anemia in chronic kidney disease: Secondary | ICD-10-CM | POA: Diagnosis not present

## 2017-10-13 DIAGNOSIS — D509 Iron deficiency anemia, unspecified: Secondary | ICD-10-CM | POA: Diagnosis not present

## 2017-10-13 DIAGNOSIS — Z992 Dependence on renal dialysis: Secondary | ICD-10-CM | POA: Diagnosis not present

## 2017-10-14 ENCOUNTER — Telehealth: Payer: Self-pay | Admitting: Orthopedic Surgery

## 2017-10-14 DIAGNOSIS — D631 Anemia in chronic kidney disease: Secondary | ICD-10-CM | POA: Diagnosis not present

## 2017-10-14 DIAGNOSIS — N186 End stage renal disease: Secondary | ICD-10-CM | POA: Diagnosis not present

## 2017-10-14 DIAGNOSIS — I1311 Hypertensive heart and chronic kidney disease without heart failure, with stage 5 chronic kidney disease, or end stage renal disease: Secondary | ICD-10-CM | POA: Diagnosis not present

## 2017-10-14 DIAGNOSIS — Z992 Dependence on renal dialysis: Secondary | ICD-10-CM | POA: Diagnosis not present

## 2017-10-14 DIAGNOSIS — E162 Hypoglycemia, unspecified: Secondary | ICD-10-CM | POA: Diagnosis not present

## 2017-10-14 DIAGNOSIS — M76891 Other specified enthesopathies of right lower limb, excluding foot: Secondary | ICD-10-CM

## 2017-10-14 DIAGNOSIS — E46 Unspecified protein-calorie malnutrition: Secondary | ICD-10-CM | POA: Diagnosis not present

## 2017-10-14 NOTE — Telephone Encounter (Signed)
Call from Tressia Danas, physical therapist with [facility], direct ph# [phone] - states was made aware that Dr Aline Brochure ordered out-patient therapy, although patient may not have mentioned at time of office visit 10/12/17 that she is already receiving home therapy. Said patient also has dialysis 3 days per week, and therefore, may have a difficult time attending out-patient therapy.  Please call and please advise.

## 2017-10-15 DIAGNOSIS — Z992 Dependence on renal dialysis: Secondary | ICD-10-CM | POA: Diagnosis not present

## 2017-10-15 DIAGNOSIS — D509 Iron deficiency anemia, unspecified: Secondary | ICD-10-CM | POA: Diagnosis not present

## 2017-10-15 DIAGNOSIS — D631 Anemia in chronic kidney disease: Secondary | ICD-10-CM | POA: Diagnosis not present

## 2017-10-15 DIAGNOSIS — N186 End stage renal disease: Secondary | ICD-10-CM | POA: Diagnosis not present

## 2017-10-17 DIAGNOSIS — E162 Hypoglycemia, unspecified: Secondary | ICD-10-CM | POA: Diagnosis not present

## 2017-10-17 DIAGNOSIS — N186 End stage renal disease: Secondary | ICD-10-CM | POA: Diagnosis not present

## 2017-10-17 DIAGNOSIS — I1311 Hypertensive heart and chronic kidney disease without heart failure, with stage 5 chronic kidney disease, or end stage renal disease: Secondary | ICD-10-CM | POA: Diagnosis not present

## 2017-10-17 DIAGNOSIS — E46 Unspecified protein-calorie malnutrition: Secondary | ICD-10-CM | POA: Diagnosis not present

## 2017-10-17 DIAGNOSIS — D631 Anemia in chronic kidney disease: Secondary | ICD-10-CM | POA: Diagnosis not present

## 2017-10-17 DIAGNOSIS — Z992 Dependence on renal dialysis: Secondary | ICD-10-CM | POA: Diagnosis not present

## 2017-10-18 DIAGNOSIS — D509 Iron deficiency anemia, unspecified: Secondary | ICD-10-CM | POA: Diagnosis not present

## 2017-10-18 DIAGNOSIS — N186 End stage renal disease: Secondary | ICD-10-CM | POA: Diagnosis not present

## 2017-10-18 DIAGNOSIS — D631 Anemia in chronic kidney disease: Secondary | ICD-10-CM | POA: Diagnosis not present

## 2017-10-18 DIAGNOSIS — Z992 Dependence on renal dialysis: Secondary | ICD-10-CM | POA: Diagnosis not present

## 2017-10-18 NOTE — Telephone Encounter (Signed)
Since home health is already working with her, have asked them to continue current and add in the right hip flexor tendonitis stretching that Dr Aline Brochure has asked for.

## 2017-10-19 DIAGNOSIS — Z992 Dependence on renal dialysis: Secondary | ICD-10-CM | POA: Diagnosis not present

## 2017-10-19 DIAGNOSIS — E46 Unspecified protein-calorie malnutrition: Secondary | ICD-10-CM | POA: Diagnosis not present

## 2017-10-19 DIAGNOSIS — E162 Hypoglycemia, unspecified: Secondary | ICD-10-CM | POA: Diagnosis not present

## 2017-10-19 DIAGNOSIS — D631 Anemia in chronic kidney disease: Secondary | ICD-10-CM | POA: Diagnosis not present

## 2017-10-19 DIAGNOSIS — I1311 Hypertensive heart and chronic kidney disease without heart failure, with stage 5 chronic kidney disease, or end stage renal disease: Secondary | ICD-10-CM | POA: Diagnosis not present

## 2017-10-19 DIAGNOSIS — N186 End stage renal disease: Secondary | ICD-10-CM | POA: Diagnosis not present

## 2017-10-20 DIAGNOSIS — D509 Iron deficiency anemia, unspecified: Secondary | ICD-10-CM | POA: Diagnosis not present

## 2017-10-20 DIAGNOSIS — N186 End stage renal disease: Secondary | ICD-10-CM | POA: Diagnosis not present

## 2017-10-20 DIAGNOSIS — Z992 Dependence on renal dialysis: Secondary | ICD-10-CM | POA: Diagnosis not present

## 2017-10-20 DIAGNOSIS — D631 Anemia in chronic kidney disease: Secondary | ICD-10-CM | POA: Diagnosis not present

## 2017-10-21 DIAGNOSIS — D631 Anemia in chronic kidney disease: Secondary | ICD-10-CM | POA: Diagnosis not present

## 2017-10-21 DIAGNOSIS — E162 Hypoglycemia, unspecified: Secondary | ICD-10-CM | POA: Diagnosis not present

## 2017-10-21 DIAGNOSIS — N186 End stage renal disease: Secondary | ICD-10-CM | POA: Diagnosis not present

## 2017-10-21 DIAGNOSIS — E46 Unspecified protein-calorie malnutrition: Secondary | ICD-10-CM | POA: Diagnosis not present

## 2017-10-21 DIAGNOSIS — Z992 Dependence on renal dialysis: Secondary | ICD-10-CM | POA: Diagnosis not present

## 2017-10-21 DIAGNOSIS — I1311 Hypertensive heart and chronic kidney disease without heart failure, with stage 5 chronic kidney disease, or end stage renal disease: Secondary | ICD-10-CM | POA: Diagnosis not present

## 2017-10-22 DIAGNOSIS — N186 End stage renal disease: Secondary | ICD-10-CM | POA: Diagnosis not present

## 2017-10-22 DIAGNOSIS — D509 Iron deficiency anemia, unspecified: Secondary | ICD-10-CM | POA: Diagnosis not present

## 2017-10-22 DIAGNOSIS — Z992 Dependence on renal dialysis: Secondary | ICD-10-CM | POA: Diagnosis not present

## 2017-10-22 DIAGNOSIS — D631 Anemia in chronic kidney disease: Secondary | ICD-10-CM | POA: Diagnosis not present

## 2017-10-24 DIAGNOSIS — Z992 Dependence on renal dialysis: Secondary | ICD-10-CM | POA: Diagnosis not present

## 2017-10-24 DIAGNOSIS — N186 End stage renal disease: Secondary | ICD-10-CM | POA: Diagnosis not present

## 2017-10-24 DIAGNOSIS — E46 Unspecified protein-calorie malnutrition: Secondary | ICD-10-CM | POA: Diagnosis not present

## 2017-10-24 DIAGNOSIS — E162 Hypoglycemia, unspecified: Secondary | ICD-10-CM | POA: Diagnosis not present

## 2017-10-24 DIAGNOSIS — D631 Anemia in chronic kidney disease: Secondary | ICD-10-CM | POA: Diagnosis not present

## 2017-10-24 DIAGNOSIS — I1311 Hypertensive heart and chronic kidney disease without heart failure, with stage 5 chronic kidney disease, or end stage renal disease: Secondary | ICD-10-CM | POA: Diagnosis not present

## 2017-10-25 DIAGNOSIS — D509 Iron deficiency anemia, unspecified: Secondary | ICD-10-CM | POA: Diagnosis not present

## 2017-10-25 DIAGNOSIS — Z992 Dependence on renal dialysis: Secondary | ICD-10-CM | POA: Diagnosis not present

## 2017-10-25 DIAGNOSIS — D631 Anemia in chronic kidney disease: Secondary | ICD-10-CM | POA: Diagnosis not present

## 2017-10-25 DIAGNOSIS — N186 End stage renal disease: Secondary | ICD-10-CM | POA: Diagnosis not present

## 2017-10-26 DIAGNOSIS — I1311 Hypertensive heart and chronic kidney disease without heart failure, with stage 5 chronic kidney disease, or end stage renal disease: Secondary | ICD-10-CM | POA: Diagnosis not present

## 2017-10-26 DIAGNOSIS — N186 End stage renal disease: Secondary | ICD-10-CM | POA: Diagnosis not present

## 2017-10-26 DIAGNOSIS — E162 Hypoglycemia, unspecified: Secondary | ICD-10-CM | POA: Diagnosis not present

## 2017-10-26 DIAGNOSIS — D631 Anemia in chronic kidney disease: Secondary | ICD-10-CM | POA: Diagnosis not present

## 2017-10-26 DIAGNOSIS — Z992 Dependence on renal dialysis: Secondary | ICD-10-CM | POA: Diagnosis not present

## 2017-10-26 DIAGNOSIS — E46 Unspecified protein-calorie malnutrition: Secondary | ICD-10-CM | POA: Diagnosis not present

## 2017-10-27 DIAGNOSIS — Z992 Dependence on renal dialysis: Secondary | ICD-10-CM | POA: Diagnosis not present

## 2017-10-27 DIAGNOSIS — D509 Iron deficiency anemia, unspecified: Secondary | ICD-10-CM | POA: Diagnosis not present

## 2017-10-27 DIAGNOSIS — N186 End stage renal disease: Secondary | ICD-10-CM | POA: Diagnosis not present

## 2017-10-27 DIAGNOSIS — D631 Anemia in chronic kidney disease: Secondary | ICD-10-CM | POA: Diagnosis not present

## 2017-10-29 DIAGNOSIS — Z992 Dependence on renal dialysis: Secondary | ICD-10-CM | POA: Diagnosis not present

## 2017-10-29 DIAGNOSIS — N186 End stage renal disease: Secondary | ICD-10-CM | POA: Diagnosis not present

## 2017-10-29 DIAGNOSIS — D631 Anemia in chronic kidney disease: Secondary | ICD-10-CM | POA: Diagnosis not present

## 2017-10-29 DIAGNOSIS — D509 Iron deficiency anemia, unspecified: Secondary | ICD-10-CM | POA: Diagnosis not present

## 2017-10-30 DIAGNOSIS — N186 End stage renal disease: Secondary | ICD-10-CM | POA: Diagnosis not present

## 2017-10-30 DIAGNOSIS — Z992 Dependence on renal dialysis: Secondary | ICD-10-CM | POA: Diagnosis not present

## 2017-10-31 DIAGNOSIS — I1311 Hypertensive heart and chronic kidney disease without heart failure, with stage 5 chronic kidney disease, or end stage renal disease: Secondary | ICD-10-CM | POA: Diagnosis not present

## 2017-10-31 DIAGNOSIS — N186 End stage renal disease: Secondary | ICD-10-CM | POA: Diagnosis not present

## 2017-10-31 DIAGNOSIS — E46 Unspecified protein-calorie malnutrition: Secondary | ICD-10-CM | POA: Diagnosis not present

## 2017-10-31 DIAGNOSIS — E162 Hypoglycemia, unspecified: Secondary | ICD-10-CM | POA: Diagnosis not present

## 2017-10-31 DIAGNOSIS — Z992 Dependence on renal dialysis: Secondary | ICD-10-CM | POA: Diagnosis not present

## 2017-10-31 DIAGNOSIS — D631 Anemia in chronic kidney disease: Secondary | ICD-10-CM | POA: Diagnosis not present

## 2017-11-01 DIAGNOSIS — N2581 Secondary hyperparathyroidism of renal origin: Secondary | ICD-10-CM | POA: Diagnosis not present

## 2017-11-01 DIAGNOSIS — D509 Iron deficiency anemia, unspecified: Secondary | ICD-10-CM | POA: Diagnosis not present

## 2017-11-01 DIAGNOSIS — N186 End stage renal disease: Secondary | ICD-10-CM | POA: Diagnosis not present

## 2017-11-01 DIAGNOSIS — D631 Anemia in chronic kidney disease: Secondary | ICD-10-CM | POA: Diagnosis not present

## 2017-11-01 DIAGNOSIS — Z992 Dependence on renal dialysis: Secondary | ICD-10-CM | POA: Diagnosis not present

## 2017-11-02 DIAGNOSIS — D631 Anemia in chronic kidney disease: Secondary | ICD-10-CM | POA: Diagnosis not present

## 2017-11-02 DIAGNOSIS — E46 Unspecified protein-calorie malnutrition: Secondary | ICD-10-CM | POA: Diagnosis not present

## 2017-11-02 DIAGNOSIS — E162 Hypoglycemia, unspecified: Secondary | ICD-10-CM | POA: Diagnosis not present

## 2017-11-02 DIAGNOSIS — Z992 Dependence on renal dialysis: Secondary | ICD-10-CM | POA: Diagnosis not present

## 2017-11-02 DIAGNOSIS — N186 End stage renal disease: Secondary | ICD-10-CM | POA: Diagnosis not present

## 2017-11-02 DIAGNOSIS — I1311 Hypertensive heart and chronic kidney disease without heart failure, with stage 5 chronic kidney disease, or end stage renal disease: Secondary | ICD-10-CM | POA: Diagnosis not present

## 2017-11-03 DIAGNOSIS — D509 Iron deficiency anemia, unspecified: Secondary | ICD-10-CM | POA: Diagnosis not present

## 2017-11-03 DIAGNOSIS — N186 End stage renal disease: Secondary | ICD-10-CM | POA: Diagnosis not present

## 2017-11-03 DIAGNOSIS — Z992 Dependence on renal dialysis: Secondary | ICD-10-CM | POA: Diagnosis not present

## 2017-11-03 DIAGNOSIS — N2581 Secondary hyperparathyroidism of renal origin: Secondary | ICD-10-CM | POA: Diagnosis not present

## 2017-11-03 DIAGNOSIS — D631 Anemia in chronic kidney disease: Secondary | ICD-10-CM | POA: Diagnosis not present

## 2017-11-05 DIAGNOSIS — D509 Iron deficiency anemia, unspecified: Secondary | ICD-10-CM | POA: Diagnosis not present

## 2017-11-05 DIAGNOSIS — Z992 Dependence on renal dialysis: Secondary | ICD-10-CM | POA: Diagnosis not present

## 2017-11-05 DIAGNOSIS — D631 Anemia in chronic kidney disease: Secondary | ICD-10-CM | POA: Diagnosis not present

## 2017-11-05 DIAGNOSIS — N2581 Secondary hyperparathyroidism of renal origin: Secondary | ICD-10-CM | POA: Diagnosis not present

## 2017-11-05 DIAGNOSIS — N186 End stage renal disease: Secondary | ICD-10-CM | POA: Diagnosis not present

## 2017-11-08 DIAGNOSIS — N186 End stage renal disease: Secondary | ICD-10-CM | POA: Diagnosis not present

## 2017-11-08 DIAGNOSIS — N2581 Secondary hyperparathyroidism of renal origin: Secondary | ICD-10-CM | POA: Diagnosis not present

## 2017-11-08 DIAGNOSIS — D509 Iron deficiency anemia, unspecified: Secondary | ICD-10-CM | POA: Diagnosis not present

## 2017-11-08 DIAGNOSIS — D631 Anemia in chronic kidney disease: Secondary | ICD-10-CM | POA: Diagnosis not present

## 2017-11-08 DIAGNOSIS — Z992 Dependence on renal dialysis: Secondary | ICD-10-CM | POA: Diagnosis not present

## 2017-11-09 DIAGNOSIS — E46 Unspecified protein-calorie malnutrition: Secondary | ICD-10-CM | POA: Diagnosis not present

## 2017-11-09 DIAGNOSIS — D631 Anemia in chronic kidney disease: Secondary | ICD-10-CM | POA: Diagnosis not present

## 2017-11-09 DIAGNOSIS — N186 End stage renal disease: Secondary | ICD-10-CM | POA: Diagnosis not present

## 2017-11-09 DIAGNOSIS — E162 Hypoglycemia, unspecified: Secondary | ICD-10-CM | POA: Diagnosis not present

## 2017-11-09 DIAGNOSIS — Z992 Dependence on renal dialysis: Secondary | ICD-10-CM | POA: Diagnosis not present

## 2017-11-09 DIAGNOSIS — I1311 Hypertensive heart and chronic kidney disease without heart failure, with stage 5 chronic kidney disease, or end stage renal disease: Secondary | ICD-10-CM | POA: Diagnosis not present

## 2017-11-10 DIAGNOSIS — D509 Iron deficiency anemia, unspecified: Secondary | ICD-10-CM | POA: Diagnosis not present

## 2017-11-10 DIAGNOSIS — D631 Anemia in chronic kidney disease: Secondary | ICD-10-CM | POA: Diagnosis not present

## 2017-11-10 DIAGNOSIS — Z992 Dependence on renal dialysis: Secondary | ICD-10-CM | POA: Diagnosis not present

## 2017-11-10 DIAGNOSIS — N186 End stage renal disease: Secondary | ICD-10-CM | POA: Diagnosis not present

## 2017-11-10 DIAGNOSIS — N2581 Secondary hyperparathyroidism of renal origin: Secondary | ICD-10-CM | POA: Diagnosis not present

## 2017-11-12 DIAGNOSIS — D631 Anemia in chronic kidney disease: Secondary | ICD-10-CM | POA: Diagnosis not present

## 2017-11-12 DIAGNOSIS — D509 Iron deficiency anemia, unspecified: Secondary | ICD-10-CM | POA: Diagnosis not present

## 2017-11-12 DIAGNOSIS — N186 End stage renal disease: Secondary | ICD-10-CM | POA: Diagnosis not present

## 2017-11-12 DIAGNOSIS — Z992 Dependence on renal dialysis: Secondary | ICD-10-CM | POA: Diagnosis not present

## 2017-11-12 DIAGNOSIS — N2581 Secondary hyperparathyroidism of renal origin: Secondary | ICD-10-CM | POA: Diagnosis not present

## 2017-11-13 DIAGNOSIS — D509 Iron deficiency anemia, unspecified: Secondary | ICD-10-CM | POA: Diagnosis not present

## 2017-11-13 DIAGNOSIS — D631 Anemia in chronic kidney disease: Secondary | ICD-10-CM | POA: Diagnosis not present

## 2017-11-13 DIAGNOSIS — N2581 Secondary hyperparathyroidism of renal origin: Secondary | ICD-10-CM | POA: Diagnosis not present

## 2017-11-13 DIAGNOSIS — Z992 Dependence on renal dialysis: Secondary | ICD-10-CM | POA: Diagnosis not present

## 2017-11-13 DIAGNOSIS — N186 End stage renal disease: Secondary | ICD-10-CM | POA: Diagnosis not present

## 2017-11-15 DIAGNOSIS — N2581 Secondary hyperparathyroidism of renal origin: Secondary | ICD-10-CM | POA: Diagnosis not present

## 2017-11-15 DIAGNOSIS — Z992 Dependence on renal dialysis: Secondary | ICD-10-CM | POA: Diagnosis not present

## 2017-11-15 DIAGNOSIS — D509 Iron deficiency anemia, unspecified: Secondary | ICD-10-CM | POA: Diagnosis not present

## 2017-11-15 DIAGNOSIS — N186 End stage renal disease: Secondary | ICD-10-CM | POA: Diagnosis not present

## 2017-11-15 DIAGNOSIS — D631 Anemia in chronic kidney disease: Secondary | ICD-10-CM | POA: Diagnosis not present

## 2017-11-17 DIAGNOSIS — D631 Anemia in chronic kidney disease: Secondary | ICD-10-CM | POA: Diagnosis not present

## 2017-11-17 DIAGNOSIS — Z992 Dependence on renal dialysis: Secondary | ICD-10-CM | POA: Diagnosis not present

## 2017-11-17 DIAGNOSIS — D509 Iron deficiency anemia, unspecified: Secondary | ICD-10-CM | POA: Diagnosis not present

## 2017-11-17 DIAGNOSIS — N2581 Secondary hyperparathyroidism of renal origin: Secondary | ICD-10-CM | POA: Diagnosis not present

## 2017-11-17 DIAGNOSIS — N186 End stage renal disease: Secondary | ICD-10-CM | POA: Diagnosis not present

## 2017-11-19 DIAGNOSIS — Z992 Dependence on renal dialysis: Secondary | ICD-10-CM | POA: Diagnosis not present

## 2017-11-19 DIAGNOSIS — N2581 Secondary hyperparathyroidism of renal origin: Secondary | ICD-10-CM | POA: Diagnosis not present

## 2017-11-19 DIAGNOSIS — D509 Iron deficiency anemia, unspecified: Secondary | ICD-10-CM | POA: Diagnosis not present

## 2017-11-19 DIAGNOSIS — D631 Anemia in chronic kidney disease: Secondary | ICD-10-CM | POA: Diagnosis not present

## 2017-11-19 DIAGNOSIS — N186 End stage renal disease: Secondary | ICD-10-CM | POA: Diagnosis not present

## 2017-11-22 DIAGNOSIS — D631 Anemia in chronic kidney disease: Secondary | ICD-10-CM | POA: Diagnosis not present

## 2017-11-22 DIAGNOSIS — Z992 Dependence on renal dialysis: Secondary | ICD-10-CM | POA: Diagnosis not present

## 2017-11-22 DIAGNOSIS — D509 Iron deficiency anemia, unspecified: Secondary | ICD-10-CM | POA: Diagnosis not present

## 2017-11-22 DIAGNOSIS — N186 End stage renal disease: Secondary | ICD-10-CM | POA: Diagnosis not present

## 2017-11-22 DIAGNOSIS — N2581 Secondary hyperparathyroidism of renal origin: Secondary | ICD-10-CM | POA: Diagnosis not present

## 2017-11-24 DIAGNOSIS — D509 Iron deficiency anemia, unspecified: Secondary | ICD-10-CM | POA: Diagnosis not present

## 2017-11-24 DIAGNOSIS — N2581 Secondary hyperparathyroidism of renal origin: Secondary | ICD-10-CM | POA: Diagnosis not present

## 2017-11-24 DIAGNOSIS — Z992 Dependence on renal dialysis: Secondary | ICD-10-CM | POA: Diagnosis not present

## 2017-11-24 DIAGNOSIS — D631 Anemia in chronic kidney disease: Secondary | ICD-10-CM | POA: Diagnosis not present

## 2017-11-24 DIAGNOSIS — N186 End stage renal disease: Secondary | ICD-10-CM | POA: Diagnosis not present

## 2017-11-26 DIAGNOSIS — N186 End stage renal disease: Secondary | ICD-10-CM | POA: Diagnosis not present

## 2017-11-26 DIAGNOSIS — Z992 Dependence on renal dialysis: Secondary | ICD-10-CM | POA: Diagnosis not present

## 2017-11-26 DIAGNOSIS — D509 Iron deficiency anemia, unspecified: Secondary | ICD-10-CM | POA: Diagnosis not present

## 2017-11-26 DIAGNOSIS — D631 Anemia in chronic kidney disease: Secondary | ICD-10-CM | POA: Diagnosis not present

## 2017-11-26 DIAGNOSIS — N2581 Secondary hyperparathyroidism of renal origin: Secondary | ICD-10-CM | POA: Diagnosis not present

## 2017-11-28 ENCOUNTER — Telehealth: Payer: Self-pay | Admitting: Orthopedic Surgery

## 2017-11-28 NOTE — Telephone Encounter (Signed)
Patient had left message on answeing machine.  She states she is doing fine

## 2017-11-29 DIAGNOSIS — N2581 Secondary hyperparathyroidism of renal origin: Secondary | ICD-10-CM | POA: Diagnosis not present

## 2017-11-29 DIAGNOSIS — D509 Iron deficiency anemia, unspecified: Secondary | ICD-10-CM | POA: Diagnosis not present

## 2017-11-29 DIAGNOSIS — D631 Anemia in chronic kidney disease: Secondary | ICD-10-CM | POA: Diagnosis not present

## 2017-11-29 DIAGNOSIS — N186 End stage renal disease: Secondary | ICD-10-CM | POA: Diagnosis not present

## 2017-11-29 DIAGNOSIS — Z992 Dependence on renal dialysis: Secondary | ICD-10-CM | POA: Diagnosis not present

## 2017-11-30 ENCOUNTER — Ambulatory Visit: Payer: Self-pay | Admitting: Orthopedic Surgery

## 2017-11-30 DIAGNOSIS — N186 End stage renal disease: Secondary | ICD-10-CM | POA: Diagnosis not present

## 2017-11-30 DIAGNOSIS — D509 Iron deficiency anemia, unspecified: Secondary | ICD-10-CM | POA: Diagnosis not present

## 2017-11-30 DIAGNOSIS — Z992 Dependence on renal dialysis: Secondary | ICD-10-CM | POA: Diagnosis not present

## 2017-11-30 DIAGNOSIS — D631 Anemia in chronic kidney disease: Secondary | ICD-10-CM | POA: Diagnosis not present

## 2017-12-01 DIAGNOSIS — Z992 Dependence on renal dialysis: Secondary | ICD-10-CM | POA: Diagnosis not present

## 2017-12-01 DIAGNOSIS — N186 End stage renal disease: Secondary | ICD-10-CM | POA: Diagnosis not present

## 2017-12-01 DIAGNOSIS — D631 Anemia in chronic kidney disease: Secondary | ICD-10-CM | POA: Diagnosis not present

## 2017-12-01 DIAGNOSIS — D509 Iron deficiency anemia, unspecified: Secondary | ICD-10-CM | POA: Diagnosis not present

## 2017-12-03 DIAGNOSIS — D631 Anemia in chronic kidney disease: Secondary | ICD-10-CM | POA: Diagnosis not present

## 2017-12-03 DIAGNOSIS — N186 End stage renal disease: Secondary | ICD-10-CM | POA: Diagnosis not present

## 2017-12-03 DIAGNOSIS — D509 Iron deficiency anemia, unspecified: Secondary | ICD-10-CM | POA: Diagnosis not present

## 2017-12-03 DIAGNOSIS — Z992 Dependence on renal dialysis: Secondary | ICD-10-CM | POA: Diagnosis not present

## 2017-12-06 DIAGNOSIS — D631 Anemia in chronic kidney disease: Secondary | ICD-10-CM | POA: Diagnosis not present

## 2017-12-06 DIAGNOSIS — D509 Iron deficiency anemia, unspecified: Secondary | ICD-10-CM | POA: Diagnosis not present

## 2017-12-06 DIAGNOSIS — Z992 Dependence on renal dialysis: Secondary | ICD-10-CM | POA: Diagnosis not present

## 2017-12-06 DIAGNOSIS — N186 End stage renal disease: Secondary | ICD-10-CM | POA: Diagnosis not present

## 2017-12-08 DIAGNOSIS — N186 End stage renal disease: Secondary | ICD-10-CM | POA: Diagnosis not present

## 2017-12-08 DIAGNOSIS — D631 Anemia in chronic kidney disease: Secondary | ICD-10-CM | POA: Diagnosis not present

## 2017-12-08 DIAGNOSIS — Z992 Dependence on renal dialysis: Secondary | ICD-10-CM | POA: Diagnosis not present

## 2017-12-08 DIAGNOSIS — D509 Iron deficiency anemia, unspecified: Secondary | ICD-10-CM | POA: Diagnosis not present

## 2017-12-09 ENCOUNTER — Emergency Department (HOSPITAL_COMMUNITY)
Admission: EM | Admit: 2017-12-09 | Discharge: 2017-12-09 | Disposition: A | Discharge status: Home / Self Care | Payer: Medicare Other | Attending: Emergency Medicine | Admitting: Emergency Medicine

## 2017-12-09 ENCOUNTER — Other Ambulatory Visit: Payer: Self-pay

## 2017-12-09 ENCOUNTER — Encounter (HOSPITAL_COMMUNITY): Payer: Self-pay

## 2017-12-09 DIAGNOSIS — N186 End stage renal disease: Secondary | ICD-10-CM | POA: Diagnosis not present

## 2017-12-09 DIAGNOSIS — E875 Hyperkalemia: Secondary | ICD-10-CM | POA: Insufficient documentation

## 2017-12-09 DIAGNOSIS — E039 Hypothyroidism, unspecified: Secondary | ICD-10-CM | POA: Insufficient documentation

## 2017-12-09 DIAGNOSIS — Z7982 Long term (current) use of aspirin: Secondary | ICD-10-CM | POA: Insufficient documentation

## 2017-12-09 DIAGNOSIS — Z992 Dependence on renal dialysis: Secondary | ICD-10-CM | POA: Insufficient documentation

## 2017-12-09 DIAGNOSIS — Z79899 Other long term (current) drug therapy: Secondary | ICD-10-CM | POA: Diagnosis not present

## 2017-12-09 DIAGNOSIS — I120 Hypertensive chronic kidney disease with stage 5 chronic kidney disease or end stage renal disease: Secondary | ICD-10-CM | POA: Diagnosis not present

## 2017-12-09 LAB — CBC
HCT: 34.1 % — ABNORMAL LOW (ref 36.0–46.0)
Hemoglobin: 10.4 g/dL — ABNORMAL LOW (ref 12.0–15.0)
MCH: 25.8 pg — AB (ref 26.0–34.0)
MCHC: 30.5 g/dL (ref 30.0–36.0)
MCV: 84.6 fL (ref 78.0–100.0)
Platelets: 262 10*3/uL (ref 150–400)
RBC: 4.03 MIL/uL (ref 3.87–5.11)
RDW: 18.9 % — ABNORMAL HIGH (ref 11.5–15.5)
WBC: 4.9 10*3/uL (ref 4.0–10.5)

## 2017-12-09 LAB — BASIC METABOLIC PANEL
Anion gap: 13 (ref 5–15)
BUN: 55 mg/dL — ABNORMAL HIGH (ref 6–20)
CALCIUM: 9.2 mg/dL (ref 8.9–10.3)
CO2: 25 mmol/L (ref 22–32)
Chloride: 97 mmol/L — ABNORMAL LOW (ref 101–111)
Creatinine, Ser: 7.78 mg/dL — ABNORMAL HIGH (ref 0.44–1.00)
GFR calc non Af Amer: 5 mL/min — ABNORMAL LOW (ref >60)
GFR, EST AFRICAN AMERICAN: 5 mL/min — AB (ref >60)
Glucose, Bld: 88 mg/dL (ref 65–99)
Potassium: 5.6 mmol/L — ABNORMAL HIGH (ref 3.5–5.1)
SODIUM: 135 mmol/L (ref 135–145)

## 2017-12-09 MED ORDER — SODIUM POLYSTYRENE SULFONATE 15 GM/60ML PO SUSP
45.0000 g | Freq: Once | ORAL | Status: AC
  Administered 2017-12-09: 45 g via ORAL
  Filled 2017-12-09: qty 180

## 2017-12-09 NOTE — ED Provider Notes (Signed)
[facility] EMERGENCY DEPARTMENT Provider Note   CSN: [id number] Arrival date & time: 12/09/17  1928     History   Chief Complaint Chief Complaint  Patient presents with  . Abnormal Lab    HPI Sonya [name] is a 67 y.o. female.  HPI   67 year old female on dialysis presents today due to hyperkalemia.  She states that she had dialysis yesterday and had labs drawn.  Dr. Lowanda Foster called her and told her that her potassium was elevated and she needed to come to the ED.  She denies any syncope, chest pain, change in fluid intake or missing dialysis.  She goes to dialysis on Tuesday Thursday and Saturday.  Past Medical History:  Diagnosis Date  . Anemia   . Anxiety   . CKD (chronic kidney disease)   . Diverticulitis   . Dysphagia   . ESRD (end stage renal disease) ([place])    On HD  . Fracture of femoral neck, left, closed ([place]) 05/30/2015  . Gastritis 06/2013 & 05/2015  . Gastroesophageal reflux disease with stricture 05/2015  . GERD (gastroesophageal reflux disease)   . HTN (hypertension)   . Hyperparathyroidism due to renal insufficiency ([place])   . Hypertensive urgency 06/2013 & 04/2015  . Hypothyroidism   . ICH (intracerebral hemorrhage) ([place]) 10/2014  . 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 Protein calorie malnutrition ([place])   . Stroke ([place])   . Uterine cancer ([place]) 2012  . Vertical diplopia   . Vertigo     Patient Active Problem List   Diagnosis Date Noted  . Atherosclerotic cerebrovascular disease   . End stage renal failure on dialysis ([place])   . Altered mental state 09/28/2017  . Acute ischemic stroke ([place]) 09/28/2017  . Acute metabolic encephalopathy 40/98/1191  . Hyperlipidemia 09/27/2017  . Altered mental status 09/26/2017  . Lactic acidosis 09/26/2017  . Anxiety state 02/03/2017  . Malnutrition of moderate degree 01/19/2017  . Hypoglycemia 01/18/2017  . 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 ESRD (end stage renal disease) ([place])   . Abnormal weight loss 12/01/2015    . Pulmonary edema 08/19/2015  . CAP (community acquired pneumonia) 08/19/2015  . Hypothyroidism, adult 06/14/2015  . HA (headache) 06/13/2015  . HTN (hypertension), malignant 06/13/2015  . Elective surgery   . Fracture of femoral neck, left, closed ([place]) 05/30/2015  . Hip fracture ([place]) 05/30/2015  . Closed fracture of neck of left femur ([place])   . Gastroesophageal reflux disease with stricture 05/03/2015  . UTI (lower urinary tract infection) 04/14/2015  . Orthostatic hypotension 11/27/2014  . 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 Acute encephalopathy   . Confusion   . Protein-calorie malnutrition, severe ([place]) 10/15/2014  . Dysphagia 10/15/2014  . Intracerebral bleed ([place]) 10/14/2014  . End stage renal disease ([place]) 08/31/2013  . Symptomatic anemia 08/18/2013  . Hyperparathyroidism due to renal insufficiency ([place]) 07/09/2013  . Constipation 06/21/2013  . Gastritis 06/21/2013  . Chronic kidney disease 06/20/2013  . Anemia in chronic kidney disease 06/08/2013  . Gout 06/08/2013  . Other dysphagia 06/07/2013  . Essential hypertension 04/08/2011  . Colon cancer screening 04/08/2011    Past Surgical History:  Procedure Laterality Date  . ABDOMINAL HYSTERECTOMY    . APPENDECTOMY    . BASCILIC VEIN TRANSPOSITION Right 09/17/2013   Procedure: BRACHIAL VEIN TRANSPOSITION;  Surgeon: Conrad Dunlap, MD;  Location: [facility];  Service: Vascular;  Laterality: Right;  . BASCILIC VEIN TRANSPOSITION Right 10/29/2013   Procedure: RIGHT SECOND STAGE  BRACHIAL VEIN TRANSPOSITION;  Surgeon: Conrad Covington, MD;  Location: [facility];  Service: Vascular;  Laterality: Right;  . CESAREAN SECTION    . CHOLECYSTECTOMY    . COLONOSCOPY  2000   TICS, IH  . COLONOSCOPY  2003 NUR BRBPR D50 V6   DC/[place] TICS, IH  . COLONOSCOPY N/A 09/04/2013   SLF: Small ulcer in the descending colon, one colon polyp (tubular adenoma), large internal hemorrhoids, moderate diverticulosis. next tcs 10  years.  . ESOPHAGOGASTRODUODENOSCOPY N/A 05/23/2015   PFX:TKWI non-erosive gastritis/stricture at the gastroesophageal junction  . ESOPHAGOGASTRODUODENOSCOPY (EGD) WITH ESOPHAGEAL DILATION N/A 06/08/2013   Dr. Fields:Stricture at the gastroesophageal junction/MILD NON-erosive gastritis (inflammation) was found in the gastric antrum; multiple biopsies/NO SOURCE FOR ANEMIA IDETIFIED-MOST LIKELY DUE TO ANEMIA OF CHRONIC DISEASE  . FLEXIBLE SIGMOIDOSCOPY N/A 01/02/2016   Dr. Oneida Alar: external hemorrhoids, diverticulosis, internal hemorrhoids, proximal rectum normal  . GIVENS CAPSULE STUDY N/A 12/03/2013   SLF: occasional gastric erosion. small bowel normal  . HIP PINNING,CANNULATED Left 05/31/2015   Procedure: CANNULATED HIP PINNING;  Surgeon: Leandrew Koyanagi, MD;  Location: [facility];  Service: Orthopedics;  Laterality: Left;  . INSERTION OF DIALYSIS CATHETER Left 09/17/2013   Procedure: INSERTION OF DIALYSIS CATHETER;  Surgeon: Conrad [place], MD;  Location: [facility];  Service: Vascular;  Laterality: Left;  . LAPAROSCOPIC TOTAL HYSTERECTOMY    . NASAL SEPTUM SURGERY    . SAVORY DILATION N/A 05/23/2015   Procedure: SAVORY DILATION;  Surgeon: Danie Binder, MD;  [facility];  Service: Endoscopy;  Laterality: N/A;  . THROMBECTOMY AND REVISION OF ARTERIOVENTOUS (AV) GORETEX  GRAFT Right 07/06/2017   Procedure: THROMBECTOMY AND REVISION OF ARTERIOVENOUS (AV) GORETEX  GRAFT RIGHT UPPER ARM;  Surgeon: Rosetta Posner, MD;  [facility];  Service: Vascular;  Laterality: Right;     OB History    Gravida  1   Para  1   Term  1   Preterm      AB      Living        SAB      TAB      Ectopic      Multiple      Live Births               Home Medications    Prior to Admission medications   Medication Sig Start Date End Date Taking? Authorizing Provider  allopurinol (ZYLOPRIM) 300 MG tablet Take 0.5 tablets (150 mg total) by mouth daily. 09/29/17  Yes Tat, Shanon Brow, MD  ALPRAZolam Duanne Moron)  0.5 MG tablet Take 1 tablet (0.5 mg total) by mouth 2 (two) times daily as needed for anxiety. 09/29/17  Yes TatShanon Brow, MD  aspirin EC 81 MG EC tablet Take 1 tablet (81 mg total) by mouth daily. 09/29/17  Yes Tat, Shanon Brow, MD  atorvastatin (LIPITOR) 40 MG tablet Take 1 tablet (40 mg total) by mouth daily at 6 PM. 10/01/17  Yes Tat, Shanon Brow, MD  folic acid (FOLVITE) 1 MG tablet Take 2 mg by mouth daily.   Yes [provider]  levothyroxine (SYNTHROID, LEVOTHROID) 50 MCG tablet Take 1 tablet (50 mcg total) by mouth daily. Patient taking differently: Take 50 mcg by mouth daily before breakfast.  04/17/15  Yes Samuella Cota, MD  lidocaine-prilocaine (EMLA) cream Apply 1 application topically every Tuesday, Thursday, and Saturday at 6 PM.    Yes [provider]  NIFEdipine (PROCARDIA-XL/ADALAT CC) 30 MG 24 hr tablet  Take 1 tablet (30 mg total) by mouth at bedtime. 09/29/17  Yes Tat, Shanon Brow, MD  Vitamin D, Ergocalciferol, (DRISDOL) 50000 units CAPS capsule Take 50,000 Units by mouth every 7 (seven) days.   Yes [provider]  Carboxymethylcellulose Sodium (THERATEARS) 0.25 % SOLN Apply 1-2 drops to eye daily as needed (for eye irritation).    [provider]    Family History Family History  Problem Relation Age of Onset  . Cancer Mother   . Diabetes Mother   . Hypertension Mother   . Diabetes Father   . Hypertension Father   . Hypertension Sister   . Colon cancer Neg Hx     Social History Social History   Tobacco Use  . Smoking status: Never Smoker  . Smokeless tobacco: Never Used  Substance Use Topics  . Alcohol use: No  . Drug use: No     Allergies   Cephalexin   Review of Systems Review of Systems  All other systems reviewed and are negative.    Physical Exam Updated Vital Signs BP (!) 157/85   Pulse 87   Temp 98.1 F (36.7 C) (Oral)   Resp (!) 21   Ht 1.676 m (5\' 6" )   Wt 61.7 kg (136 lb)   SpO2 92%   BMI 21.95 kg/m   Physical  Exam  Constitutional: She is oriented to person, place, and time. She appears well-developed and well-nourished.  HENT:  Head: Normocephalic and atraumatic.  Eyes: Pupils are equal, round, and reactive to light.  Neck: Normal range of motion.  Cardiovascular: Normal rate and regular rhythm.  Pulmonary/Chest: Effort normal and breath sounds normal.  Abdominal: Soft. Bowel sounds are normal.  Musculoskeletal:  Fistula with thrill right upper arm  Neurological: She is alert and oriented to person, place, and time.  Skin: Skin is warm. Capillary refill takes less than 2 seconds.  Psychiatric: She has a normal mood and affect.  Nursing note and vitals reviewed.    ED Treatments / Results  Labs (all labs ordered are listed, but only abnormal results are displayed) Labs Reviewed  BASIC METABOLIC PANEL - Abnormal; Notable for the following components:      Result Value   Potassium 5.6 (*)    Chloride 97 (*)    BUN 55 (*)    Creatinine, Ser 7.78 (*)    GFR calc non Af Amer 5 (*)    GFR calc Af Amer 5 (*)    All other components within normal limits  CBC - Abnormal; Notable for the following components:   Hemoglobin 10.4 (*)    HCT 34.1 (*)    MCH 25.8 (*)    RDW 18.9 (*)    All other components within normal limits    EKG EKG Interpretation  Date/Time:  Friday Dec 09 2017 21:36:59 EDT Ventricular Rate:  83 PR Interval:    QRS Duration: 69 QT Interval:  364 QTC Calculation: 428 R Axis:   67 Text Interpretation:  Sinus rhythm Probable left atrial enlargement Probable left ventricular hypertrophy Confirmed by Pattricia Boss [phone]) on 12/09/2017 9:52:00 PM   Radiology No results found.  Procedures Procedures (including critical care time)  Medications Ordered in ED Medications  sodium polystyrene (KAYEXALATE) 15 GM/60ML suspension 45 g (has no administration in time range)     Initial Impression / Assessment and Plan / ED Course  I have reviewed the triage vital  signs and the nursing notes.  Pertinent labs & imaging results that  were available during my care of the patient were reviewed by me and considered in my medical decision making (see chart for details).     This is a 67 year old lady on dialysis presents today with hyperkalemia.  Potassium here is 5.6.  EKG without acute changes.  Patient will have dialysis tomorrow.  She is given a dose of Kayexalate.  Final Clinical Impressions(s) / ED Diagnoses   Final diagnoses:  Hyperkalemia  ESRD (end stage renal disease) [facility])    ED Discharge Orders    None       Pattricia Boss, MD 12/09/17 2253

## 2017-12-09 NOTE — ED Triage Notes (Signed)
Pt is on dialysis T, Th, and Sat. Last dialyzed yesterday- Dr called pt and was told her potassium was to high and she needed to be seen in ED. Pt denies any symptoms or pain at this time.

## 2017-12-09 NOTE — Discharge Instructions (Addendum)
Go to dialysis tomorrow as scheduled. 

## 2017-12-10 DIAGNOSIS — Z992 Dependence on renal dialysis: Secondary | ICD-10-CM | POA: Diagnosis not present

## 2017-12-10 DIAGNOSIS — D631 Anemia in chronic kidney disease: Secondary | ICD-10-CM | POA: Diagnosis not present

## 2017-12-10 DIAGNOSIS — D509 Iron deficiency anemia, unspecified: Secondary | ICD-10-CM | POA: Diagnosis not present

## 2017-12-10 DIAGNOSIS — N186 End stage renal disease: Secondary | ICD-10-CM | POA: Diagnosis not present

## 2017-12-13 DIAGNOSIS — D631 Anemia in chronic kidney disease: Secondary | ICD-10-CM | POA: Diagnosis not present

## 2017-12-13 DIAGNOSIS — N186 End stage renal disease: Secondary | ICD-10-CM | POA: Diagnosis not present

## 2017-12-13 DIAGNOSIS — D509 Iron deficiency anemia, unspecified: Secondary | ICD-10-CM | POA: Diagnosis not present

## 2017-12-13 DIAGNOSIS — Z992 Dependence on renal dialysis: Secondary | ICD-10-CM | POA: Diagnosis not present

## 2017-12-15 DIAGNOSIS — D631 Anemia in chronic kidney disease: Secondary | ICD-10-CM | POA: Diagnosis not present

## 2017-12-15 DIAGNOSIS — D509 Iron deficiency anemia, unspecified: Secondary | ICD-10-CM | POA: Diagnosis not present

## 2017-12-15 DIAGNOSIS — N186 End stage renal disease: Secondary | ICD-10-CM | POA: Diagnosis not present

## 2017-12-15 DIAGNOSIS — Z992 Dependence on renal dialysis: Secondary | ICD-10-CM | POA: Diagnosis not present

## 2017-12-17 DIAGNOSIS — D631 Anemia in chronic kidney disease: Secondary | ICD-10-CM | POA: Diagnosis not present

## 2017-12-17 DIAGNOSIS — Z992 Dependence on renal dialysis: Secondary | ICD-10-CM | POA: Diagnosis not present

## 2017-12-17 DIAGNOSIS — D509 Iron deficiency anemia, unspecified: Secondary | ICD-10-CM | POA: Diagnosis not present

## 2017-12-17 DIAGNOSIS — N186 End stage renal disease: Secondary | ICD-10-CM | POA: Diagnosis not present

## 2017-12-20 DIAGNOSIS — N186 End stage renal disease: Secondary | ICD-10-CM | POA: Diagnosis not present

## 2017-12-20 DIAGNOSIS — D631 Anemia in chronic kidney disease: Secondary | ICD-10-CM | POA: Diagnosis not present

## 2017-12-20 DIAGNOSIS — Z992 Dependence on renal dialysis: Secondary | ICD-10-CM | POA: Diagnosis not present

## 2017-12-20 DIAGNOSIS — D509 Iron deficiency anemia, unspecified: Secondary | ICD-10-CM | POA: Diagnosis not present

## 2017-12-21 ENCOUNTER — Other Ambulatory Visit: Payer: Self-pay | Admitting: Gastroenterology

## 2017-12-22 DIAGNOSIS — D509 Iron deficiency anemia, unspecified: Secondary | ICD-10-CM | POA: Diagnosis not present

## 2017-12-22 DIAGNOSIS — D631 Anemia in chronic kidney disease: Secondary | ICD-10-CM | POA: Diagnosis not present

## 2017-12-22 DIAGNOSIS — Z992 Dependence on renal dialysis: Secondary | ICD-10-CM | POA: Diagnosis not present

## 2017-12-22 DIAGNOSIS — N186 End stage renal disease: Secondary | ICD-10-CM | POA: Diagnosis not present

## 2017-12-22 NOTE — Telephone Encounter (Signed)
Routing to SLF to approve Xanax (I've never seen this patient).

## 2017-12-22 NOTE — Telephone Encounter (Signed)
PLEASE CALL PT. SHE SHOULD SEE HER PCP FOR REFILLS.IF SHE HAS ANXIETY SHE MAY NEED TO SEE A MENTAL HEALTH SPECIALIST. LAST OPV AT [facility] JUL 2018.

## 2017-12-23 NOTE — Telephone Encounter (Signed)
PT is aware.

## 2017-12-24 DIAGNOSIS — D631 Anemia in chronic kidney disease: Secondary | ICD-10-CM | POA: Diagnosis not present

## 2017-12-24 DIAGNOSIS — N186 End stage renal disease: Secondary | ICD-10-CM | POA: Diagnosis not present

## 2017-12-24 DIAGNOSIS — D509 Iron deficiency anemia, unspecified: Secondary | ICD-10-CM | POA: Diagnosis not present

## 2017-12-24 DIAGNOSIS — Z992 Dependence on renal dialysis: Secondary | ICD-10-CM | POA: Diagnosis not present

## 2017-12-27 DIAGNOSIS — D631 Anemia in chronic kidney disease: Secondary | ICD-10-CM | POA: Diagnosis not present

## 2017-12-27 DIAGNOSIS — N186 End stage renal disease: Secondary | ICD-10-CM | POA: Diagnosis not present

## 2017-12-27 DIAGNOSIS — D509 Iron deficiency anemia, unspecified: Secondary | ICD-10-CM | POA: Diagnosis not present

## 2017-12-27 DIAGNOSIS — Z992 Dependence on renal dialysis: Secondary | ICD-10-CM | POA: Diagnosis not present

## 2017-12-29 DIAGNOSIS — N186 End stage renal disease: Secondary | ICD-10-CM | POA: Diagnosis not present

## 2017-12-29 DIAGNOSIS — D631 Anemia in chronic kidney disease: Secondary | ICD-10-CM | POA: Diagnosis not present

## 2017-12-29 DIAGNOSIS — D509 Iron deficiency anemia, unspecified: Secondary | ICD-10-CM | POA: Diagnosis not present

## 2017-12-29 DIAGNOSIS — Z992 Dependence on renal dialysis: Secondary | ICD-10-CM | POA: Diagnosis not present

## 2017-12-30 DIAGNOSIS — N186 End stage renal disease: Secondary | ICD-10-CM | POA: Diagnosis not present

## 2017-12-30 DIAGNOSIS — Z992 Dependence on renal dialysis: Secondary | ICD-10-CM | POA: Diagnosis not present

## 2017-12-31 ENCOUNTER — Other Ambulatory Visit (HOSPITAL_COMMUNITY)
Admission: RE | Admit: 2017-12-31 | Discharge: 2017-12-31 | Disposition: A | Discharge status: Home / Self Care | Payer: Medicare Other | Source: Other Acute Inpatient Hospital | Attending: Medical | Admitting: Medical

## 2017-12-31 DIAGNOSIS — D509 Iron deficiency anemia, unspecified: Secondary | ICD-10-CM | POA: Diagnosis not present

## 2017-12-31 DIAGNOSIS — D631 Anemia in chronic kidney disease: Secondary | ICD-10-CM | POA: Diagnosis not present

## 2017-12-31 DIAGNOSIS — E875 Hyperkalemia: Secondary | ICD-10-CM | POA: Insufficient documentation

## 2017-12-31 DIAGNOSIS — Z992 Dependence on renal dialysis: Secondary | ICD-10-CM | POA: Diagnosis not present

## 2017-12-31 DIAGNOSIS — N186 End stage renal disease: Secondary | ICD-10-CM | POA: Diagnosis not present

## 2017-12-31 LAB — POTASSIUM: Potassium: 7 mmol/L (ref 3.5–5.1)

## 2018-01-02 ENCOUNTER — Telehealth: Payer: Self-pay | Admitting: Gastroenterology

## 2018-01-02 ENCOUNTER — Other Ambulatory Visit: Payer: Self-pay | Admitting: Gastroenterology

## 2018-01-02 NOTE — Telephone Encounter (Signed)
Spoke with pts daughter and [facility]. [facility] is requesting a refill for Xanax for pt. Medication has been refilled by SF.

## 2018-01-02 NOTE — Telephone Encounter (Signed)
Pt's daughter called to follow up on patient's refill on xanax through [facility]. She said someone had called her and told her that her insurance doesn't cover it anymore and the Pharmacy hasn't heard from [place]. Please advise and call [phone]

## 2018-01-03 DIAGNOSIS — Z992 Dependence on renal dialysis: Secondary | ICD-10-CM | POA: Diagnosis not present

## 2018-01-03 DIAGNOSIS — D509 Iron deficiency anemia, unspecified: Secondary | ICD-10-CM | POA: Diagnosis not present

## 2018-01-03 DIAGNOSIS — N186 End stage renal disease: Secondary | ICD-10-CM | POA: Diagnosis not present

## 2018-01-03 DIAGNOSIS — D631 Anemia in chronic kidney disease: Secondary | ICD-10-CM | POA: Diagnosis not present

## 2018-01-05 DIAGNOSIS — D631 Anemia in chronic kidney disease: Secondary | ICD-10-CM | POA: Diagnosis not present

## 2018-01-05 DIAGNOSIS — D509 Iron deficiency anemia, unspecified: Secondary | ICD-10-CM | POA: Diagnosis not present

## 2018-01-05 DIAGNOSIS — Z992 Dependence on renal dialysis: Secondary | ICD-10-CM | POA: Diagnosis not present

## 2018-01-05 DIAGNOSIS — N186 End stage renal disease: Secondary | ICD-10-CM | POA: Diagnosis not present

## 2018-01-06 DIAGNOSIS — I69398 Other sequelae of cerebral infarction: Secondary | ICD-10-CM | POA: Diagnosis not present

## 2018-01-06 DIAGNOSIS — Z992 Dependence on renal dialysis: Secondary | ICD-10-CM | POA: Diagnosis not present

## 2018-01-06 DIAGNOSIS — I69311 Memory deficit following cerebral infarction: Secondary | ICD-10-CM | POA: Diagnosis not present

## 2018-01-06 DIAGNOSIS — I120 Hypertensive chronic kidney disease with stage 5 chronic kidney disease or end stage renal disease: Secondary | ICD-10-CM | POA: Diagnosis not present

## 2018-01-06 DIAGNOSIS — N185 Chronic kidney disease, stage 5: Secondary | ICD-10-CM | POA: Diagnosis not present

## 2018-01-06 DIAGNOSIS — R2689 Other abnormalities of gait and mobility: Secondary | ICD-10-CM | POA: Diagnosis not present

## 2018-01-07 ENCOUNTER — Other Ambulatory Visit (HOSPITAL_COMMUNITY)
Admission: RE | Admit: 2018-01-07 | Discharge: 2018-01-07 | Disposition: A | Discharge status: Home / Self Care | Payer: Medicare Other | Source: Other Acute Inpatient Hospital | Attending: Medical | Admitting: Medical

## 2018-01-07 DIAGNOSIS — N186 End stage renal disease: Secondary | ICD-10-CM | POA: Diagnosis not present

## 2018-01-07 DIAGNOSIS — Z992 Dependence on renal dialysis: Secondary | ICD-10-CM | POA: Diagnosis not present

## 2018-01-07 DIAGNOSIS — D631 Anemia in chronic kidney disease: Secondary | ICD-10-CM | POA: Diagnosis not present

## 2018-01-07 DIAGNOSIS — D509 Iron deficiency anemia, unspecified: Secondary | ICD-10-CM | POA: Diagnosis not present

## 2018-01-07 DIAGNOSIS — E875 Hyperkalemia: Secondary | ICD-10-CM | POA: Diagnosis not present

## 2018-01-07 LAB — POTASSIUM: Potassium: 3.9 mmol/L (ref 3.5–5.1)

## 2018-01-09 DIAGNOSIS — Z6821 Body mass index (BMI) 21.0-21.9, adult: Secondary | ICD-10-CM | POA: Diagnosis not present

## 2018-01-09 DIAGNOSIS — I672 Cerebral atherosclerosis: Secondary | ICD-10-CM | POA: Diagnosis not present

## 2018-01-09 DIAGNOSIS — F419 Anxiety disorder, unspecified: Secondary | ICD-10-CM | POA: Diagnosis not present

## 2018-01-09 DIAGNOSIS — N185 Chronic kidney disease, stage 5: Secondary | ICD-10-CM | POA: Diagnosis not present

## 2018-01-09 DIAGNOSIS — R269 Unspecified abnormalities of gait and mobility: Secondary | ICD-10-CM | POA: Diagnosis not present

## 2018-01-09 DIAGNOSIS — Z1389 Encounter for screening for other disorder: Secondary | ICD-10-CM | POA: Diagnosis not present

## 2018-01-09 DIAGNOSIS — J449 Chronic obstructive pulmonary disease, unspecified: Secondary | ICD-10-CM | POA: Diagnosis not present

## 2018-01-09 DIAGNOSIS — Z0001 Encounter for general adult medical examination with abnormal findings: Secondary | ICD-10-CM | POA: Diagnosis not present

## 2018-01-10 ENCOUNTER — Telehealth: Payer: Self-pay | Admitting: Gastroenterology

## 2018-01-10 ENCOUNTER — Telehealth: Payer: Self-pay

## 2018-01-10 DIAGNOSIS — D631 Anemia in chronic kidney disease: Secondary | ICD-10-CM | POA: Diagnosis not present

## 2018-01-10 DIAGNOSIS — Z992 Dependence on renal dialysis: Secondary | ICD-10-CM | POA: Diagnosis not present

## 2018-01-10 DIAGNOSIS — D509 Iron deficiency anemia, unspecified: Secondary | ICD-10-CM | POA: Diagnosis not present

## 2018-01-10 DIAGNOSIS — N186 End stage renal disease: Secondary | ICD-10-CM | POA: Diagnosis not present

## 2018-01-10 NOTE — Telephone Encounter (Signed)
PLEASE CALL PT. I AM ON MY LAST CASE AND WILL CALL HER THIS AFTERNOON. SHE HAS NOT BEEN SEEN BY OUR OFC IN ALMOST A YEAR. IF HER PCP CANNOT PRESCRIBE THEM THEN SHE SHOULD TALK TO HER KIDNEY DOCTOR.  I PRESCRIBED THE XANAX AS A ONE TIME RX TO GET HER THROUGH THE ANXIETY AND STRESS SHE WAS UNDER WITH HER HUSBAND. SHE WILL NEED TO ESTABLISH WITH A MENTAL HEALTH PROVIDER IF SHE THINKS SHE NEEDS MEDS FOR HER ANXIETY. HER NEPHROLOGIST CAN ALSO MAKE THAT REFERRAL.

## 2018-01-10 NOTE — Telephone Encounter (Addendum)
Called patient TO DISCUSS CONCERNS. Takes XANAX BID BUT OUT FOR 2 WEEKS. HAS ANXIETY PRIOR TO DIALYSIS. WILL TALK WITH DR. Hinda Lenis.  TALKED TO DR. Hinda Lenis. HE DOESN'T WRITE FOR ANXIOLYTICS OR NARCOTICS. HE WILL CONSIDER XANAX. [place] CAN'T WRITE FOR Duanne Moron. HE WAS UNAWARE ABOUT DR. FUSCO.

## 2018-01-10 NOTE — Telephone Encounter (Signed)
Pt called asking for SF. I told her SF wasn't in the office today. She would like for the nurse to call her back regarding her medications. [phone]

## 2018-01-10 NOTE — Telephone Encounter (Signed)
This is second phone call this morning. Pt said to tell Dr. Oneida Alar that she checked with PCP about getting her Xanax. The pharmacist told her that they cannot fill a prescription for controlled drug written by dr. Gerarda Fraction. She said her nerves are getting in bad shape again and she wants Dr. Oneida Alar to call her.

## 2018-01-10 NOTE — Telephone Encounter (Signed)
PT is aware, said her kidney doctor will not prescribe them. She will wait and discuss with Dr. Oneida Alar when she calls this afternoon.

## 2018-01-10 NOTE — Telephone Encounter (Signed)
Pt left Vm that Dr. Oneida Alar told her she will need to get her nerve medicine from PCP. She said that PCP will not fill it. Said she needs Dr. Oneida Alar to call her please.

## 2018-01-10 NOTE — Telephone Encounter (Signed)
REVIEWED.  

## 2018-01-11 DIAGNOSIS — R2689 Other abnormalities of gait and mobility: Secondary | ICD-10-CM | POA: Diagnosis not present

## 2018-01-11 DIAGNOSIS — I69311 Memory deficit following cerebral infarction: Secondary | ICD-10-CM | POA: Diagnosis not present

## 2018-01-11 DIAGNOSIS — I69398 Other sequelae of cerebral infarction: Secondary | ICD-10-CM | POA: Diagnosis not present

## 2018-01-11 DIAGNOSIS — Z992 Dependence on renal dialysis: Secondary | ICD-10-CM | POA: Diagnosis not present

## 2018-01-11 DIAGNOSIS — I120 Hypertensive chronic kidney disease with stage 5 chronic kidney disease or end stage renal disease: Secondary | ICD-10-CM | POA: Diagnosis not present

## 2018-01-11 DIAGNOSIS — N185 Chronic kidney disease, stage 5: Secondary | ICD-10-CM | POA: Diagnosis not present

## 2018-01-11 NOTE — Telephone Encounter (Signed)
REVIEWED-NO ADDITIONAL RECOMMENDATIONS. 

## 2018-01-11 NOTE — Telephone Encounter (Signed)
Called patient TO DISCUSS CONCERNS. DR. Gerarda Fraction DID REFILL Duanne Moron AT [facility]. SHE MAY CALL WITH QUESTIONS OR CONCERNS. SHE WILL  LET ME KNOW IF SHE HAS DIFFICULTY OBTAINING THE RX.

## 2018-01-11 NOTE — Telephone Encounter (Signed)
Pt called and asked me did Dr. Oneida Alar talk to Dr. Hinda Lenis about the Xanax.  I told her that she did, and he does not usually prescribe that but he will consider it since Dr. Gerarda Fraction can no longer write for it.  She said " I don't need someone to consider doing it, I need someone to write it for me",   I need to know, so if he will not do it I can make an appointment and come back in and see Dr. Oneida Alar and get her to write it for me".   I told her that Dr. Oneida Alar just wrote it to help her out when she lost her husband, and Dr. Oneida Alar does not prescribe Xanax.   She asked for Dr. Rhona Leavens phone number, and I gave it to her.

## 2018-01-12 DIAGNOSIS — N186 End stage renal disease: Secondary | ICD-10-CM | POA: Diagnosis not present

## 2018-01-12 DIAGNOSIS — D631 Anemia in chronic kidney disease: Secondary | ICD-10-CM | POA: Diagnosis not present

## 2018-01-12 DIAGNOSIS — D509 Iron deficiency anemia, unspecified: Secondary | ICD-10-CM | POA: Diagnosis not present

## 2018-01-12 DIAGNOSIS — Z992 Dependence on renal dialysis: Secondary | ICD-10-CM | POA: Diagnosis not present

## 2018-01-14 DIAGNOSIS — D631 Anemia in chronic kidney disease: Secondary | ICD-10-CM | POA: Diagnosis not present

## 2018-01-14 DIAGNOSIS — N186 End stage renal disease: Secondary | ICD-10-CM | POA: Diagnosis not present

## 2018-01-14 DIAGNOSIS — D509 Iron deficiency anemia, unspecified: Secondary | ICD-10-CM | POA: Diagnosis not present

## 2018-01-14 DIAGNOSIS — Z992 Dependence on renal dialysis: Secondary | ICD-10-CM | POA: Diagnosis not present

## 2018-01-17 DIAGNOSIS — D631 Anemia in chronic kidney disease: Secondary | ICD-10-CM | POA: Diagnosis not present

## 2018-01-17 DIAGNOSIS — D509 Iron deficiency anemia, unspecified: Secondary | ICD-10-CM | POA: Diagnosis not present

## 2018-01-17 DIAGNOSIS — Z992 Dependence on renal dialysis: Secondary | ICD-10-CM | POA: Diagnosis not present

## 2018-01-17 DIAGNOSIS — N186 End stage renal disease: Secondary | ICD-10-CM | POA: Diagnosis not present

## 2018-01-18 DIAGNOSIS — Z992 Dependence on renal dialysis: Secondary | ICD-10-CM | POA: Diagnosis not present

## 2018-01-18 DIAGNOSIS — N185 Chronic kidney disease, stage 5: Secondary | ICD-10-CM | POA: Diagnosis not present

## 2018-01-18 DIAGNOSIS — R2689 Other abnormalities of gait and mobility: Secondary | ICD-10-CM | POA: Diagnosis not present

## 2018-01-18 DIAGNOSIS — I69311 Memory deficit following cerebral infarction: Secondary | ICD-10-CM | POA: Diagnosis not present

## 2018-01-18 DIAGNOSIS — I120 Hypertensive chronic kidney disease with stage 5 chronic kidney disease or end stage renal disease: Secondary | ICD-10-CM | POA: Diagnosis not present

## 2018-01-18 DIAGNOSIS — I69398 Other sequelae of cerebral infarction: Secondary | ICD-10-CM | POA: Diagnosis not present

## 2018-01-19 DIAGNOSIS — Z992 Dependence on renal dialysis: Secondary | ICD-10-CM | POA: Diagnosis not present

## 2018-01-19 DIAGNOSIS — D631 Anemia in chronic kidney disease: Secondary | ICD-10-CM | POA: Diagnosis not present

## 2018-01-19 DIAGNOSIS — N186 End stage renal disease: Secondary | ICD-10-CM | POA: Diagnosis not present

## 2018-01-19 DIAGNOSIS — D509 Iron deficiency anemia, unspecified: Secondary | ICD-10-CM | POA: Diagnosis not present

## 2018-01-20 DIAGNOSIS — D519 Vitamin B12 deficiency anemia, unspecified: Secondary | ICD-10-CM | POA: Diagnosis not present

## 2018-01-20 DIAGNOSIS — J449 Chronic obstructive pulmonary disease, unspecified: Secondary | ICD-10-CM | POA: Diagnosis not present

## 2018-01-20 DIAGNOSIS — G629 Polyneuropathy, unspecified: Secondary | ICD-10-CM | POA: Diagnosis not present

## 2018-01-20 DIAGNOSIS — I69398 Other sequelae of cerebral infarction: Secondary | ICD-10-CM | POA: Diagnosis not present

## 2018-01-20 DIAGNOSIS — D492 Neoplasm of unspecified behavior of bone, soft tissue, and skin: Secondary | ICD-10-CM | POA: Diagnosis not present

## 2018-01-20 DIAGNOSIS — I120 Hypertensive chronic kidney disease with stage 5 chronic kidney disease or end stage renal disease: Secondary | ICD-10-CM | POA: Diagnosis not present

## 2018-01-20 DIAGNOSIS — N185 Chronic kidney disease, stage 5: Secondary | ICD-10-CM | POA: Diagnosis not present

## 2018-01-20 DIAGNOSIS — R2689 Other abnormalities of gait and mobility: Secondary | ICD-10-CM | POA: Diagnosis not present

## 2018-01-20 DIAGNOSIS — Z6821 Body mass index (BMI) 21.0-21.9, adult: Secondary | ICD-10-CM | POA: Diagnosis not present

## 2018-01-20 DIAGNOSIS — Z992 Dependence on renal dialysis: Secondary | ICD-10-CM | POA: Diagnosis not present

## 2018-01-20 DIAGNOSIS — I69311 Memory deficit following cerebral infarction: Secondary | ICD-10-CM | POA: Diagnosis not present

## 2018-01-21 ENCOUNTER — Other Ambulatory Visit (HOSPITAL_COMMUNITY)
Admission: RE | Admit: 2018-01-21 | Discharge: 2018-01-21 | Disposition: A | Discharge status: Home / Self Care | Payer: Medicare Other | Source: Other Acute Inpatient Hospital | Attending: Medical | Admitting: Medical

## 2018-01-21 DIAGNOSIS — N186 End stage renal disease: Secondary | ICD-10-CM | POA: Diagnosis not present

## 2018-01-21 DIAGNOSIS — D631 Anemia in chronic kidney disease: Secondary | ICD-10-CM | POA: Diagnosis not present

## 2018-01-21 DIAGNOSIS — D509 Iron deficiency anemia, unspecified: Secondary | ICD-10-CM | POA: Diagnosis not present

## 2018-01-21 DIAGNOSIS — E875 Hyperkalemia: Secondary | ICD-10-CM | POA: Insufficient documentation

## 2018-01-21 DIAGNOSIS — Z992 Dependence on renal dialysis: Secondary | ICD-10-CM | POA: Diagnosis not present

## 2018-01-21 LAB — POTASSIUM: POTASSIUM: 2.9 mmol/L — AB (ref 3.5–5.1)

## 2018-01-23 DIAGNOSIS — N185 Chronic kidney disease, stage 5: Secondary | ICD-10-CM | POA: Diagnosis not present

## 2018-01-23 DIAGNOSIS — I120 Hypertensive chronic kidney disease with stage 5 chronic kidney disease or end stage renal disease: Secondary | ICD-10-CM | POA: Diagnosis not present

## 2018-01-23 DIAGNOSIS — R2689 Other abnormalities of gait and mobility: Secondary | ICD-10-CM | POA: Diagnosis not present

## 2018-01-23 DIAGNOSIS — I69311 Memory deficit following cerebral infarction: Secondary | ICD-10-CM | POA: Diagnosis not present

## 2018-01-23 DIAGNOSIS — I69398 Other sequelae of cerebral infarction: Secondary | ICD-10-CM | POA: Diagnosis not present

## 2018-01-23 DIAGNOSIS — Z992 Dependence on renal dialysis: Secondary | ICD-10-CM | POA: Diagnosis not present

## 2018-01-24 DIAGNOSIS — D509 Iron deficiency anemia, unspecified: Secondary | ICD-10-CM | POA: Diagnosis not present

## 2018-01-24 DIAGNOSIS — N186 End stage renal disease: Secondary | ICD-10-CM | POA: Diagnosis not present

## 2018-01-24 DIAGNOSIS — Z992 Dependence on renal dialysis: Secondary | ICD-10-CM | POA: Diagnosis not present

## 2018-01-24 DIAGNOSIS — D631 Anemia in chronic kidney disease: Secondary | ICD-10-CM | POA: Diagnosis not present

## 2018-01-25 DIAGNOSIS — Z992 Dependence on renal dialysis: Secondary | ICD-10-CM | POA: Diagnosis not present

## 2018-01-25 DIAGNOSIS — I120 Hypertensive chronic kidney disease with stage 5 chronic kidney disease or end stage renal disease: Secondary | ICD-10-CM | POA: Diagnosis not present

## 2018-01-25 DIAGNOSIS — I69398 Other sequelae of cerebral infarction: Secondary | ICD-10-CM | POA: Diagnosis not present

## 2018-01-25 DIAGNOSIS — R2689 Other abnormalities of gait and mobility: Secondary | ICD-10-CM | POA: Diagnosis not present

## 2018-01-25 DIAGNOSIS — I69311 Memory deficit following cerebral infarction: Secondary | ICD-10-CM | POA: Diagnosis not present

## 2018-01-25 DIAGNOSIS — N185 Chronic kidney disease, stage 5: Secondary | ICD-10-CM | POA: Diagnosis not present

## 2018-01-26 DIAGNOSIS — Z992 Dependence on renal dialysis: Secondary | ICD-10-CM | POA: Diagnosis not present

## 2018-01-26 DIAGNOSIS — D631 Anemia in chronic kidney disease: Secondary | ICD-10-CM | POA: Diagnosis not present

## 2018-01-26 DIAGNOSIS — N186 End stage renal disease: Secondary | ICD-10-CM | POA: Diagnosis not present

## 2018-01-26 DIAGNOSIS — D509 Iron deficiency anemia, unspecified: Secondary | ICD-10-CM | POA: Diagnosis not present

## 2018-01-27 DIAGNOSIS — I69311 Memory deficit following cerebral infarction: Secondary | ICD-10-CM | POA: Diagnosis not present

## 2018-01-27 DIAGNOSIS — I69398 Other sequelae of cerebral infarction: Secondary | ICD-10-CM | POA: Diagnosis not present

## 2018-01-27 DIAGNOSIS — I120 Hypertensive chronic kidney disease with stage 5 chronic kidney disease or end stage renal disease: Secondary | ICD-10-CM | POA: Diagnosis not present

## 2018-01-27 DIAGNOSIS — N185 Chronic kidney disease, stage 5: Secondary | ICD-10-CM | POA: Diagnosis not present

## 2018-01-27 DIAGNOSIS — R2689 Other abnormalities of gait and mobility: Secondary | ICD-10-CM | POA: Diagnosis not present

## 2018-01-27 DIAGNOSIS — Z992 Dependence on renal dialysis: Secondary | ICD-10-CM | POA: Diagnosis not present

## 2018-01-28 DIAGNOSIS — D631 Anemia in chronic kidney disease: Secondary | ICD-10-CM | POA: Diagnosis not present

## 2018-01-28 DIAGNOSIS — N186 End stage renal disease: Secondary | ICD-10-CM | POA: Diagnosis not present

## 2018-01-28 DIAGNOSIS — D509 Iron deficiency anemia, unspecified: Secondary | ICD-10-CM | POA: Diagnosis not present

## 2018-01-28 DIAGNOSIS — Z992 Dependence on renal dialysis: Secondary | ICD-10-CM | POA: Diagnosis not present

## 2018-01-29 DIAGNOSIS — Z992 Dependence on renal dialysis: Secondary | ICD-10-CM | POA: Diagnosis not present

## 2018-01-29 DIAGNOSIS — N186 End stage renal disease: Secondary | ICD-10-CM | POA: Diagnosis not present

## 2018-01-30 DIAGNOSIS — I69311 Memory deficit following cerebral infarction: Secondary | ICD-10-CM | POA: Diagnosis not present

## 2018-01-30 DIAGNOSIS — R2689 Other abnormalities of gait and mobility: Secondary | ICD-10-CM | POA: Diagnosis not present

## 2018-01-30 DIAGNOSIS — N185 Chronic kidney disease, stage 5: Secondary | ICD-10-CM | POA: Diagnosis not present

## 2018-01-30 DIAGNOSIS — Z992 Dependence on renal dialysis: Secondary | ICD-10-CM | POA: Diagnosis not present

## 2018-01-30 DIAGNOSIS — I120 Hypertensive chronic kidney disease with stage 5 chronic kidney disease or end stage renal disease: Secondary | ICD-10-CM | POA: Diagnosis not present

## 2018-01-30 DIAGNOSIS — I69398 Other sequelae of cerebral infarction: Secondary | ICD-10-CM | POA: Diagnosis not present

## 2018-01-31 DIAGNOSIS — N2581 Secondary hyperparathyroidism of renal origin: Secondary | ICD-10-CM | POA: Diagnosis not present

## 2018-01-31 DIAGNOSIS — D509 Iron deficiency anemia, unspecified: Secondary | ICD-10-CM | POA: Diagnosis not present

## 2018-01-31 DIAGNOSIS — D631 Anemia in chronic kidney disease: Secondary | ICD-10-CM | POA: Diagnosis not present

## 2018-01-31 DIAGNOSIS — Z992 Dependence on renal dialysis: Secondary | ICD-10-CM | POA: Diagnosis not present

## 2018-01-31 DIAGNOSIS — N186 End stage renal disease: Secondary | ICD-10-CM | POA: Diagnosis not present

## 2018-02-02 DIAGNOSIS — D509 Iron deficiency anemia, unspecified: Secondary | ICD-10-CM | POA: Diagnosis not present

## 2018-02-02 DIAGNOSIS — Z992 Dependence on renal dialysis: Secondary | ICD-10-CM | POA: Diagnosis not present

## 2018-02-02 DIAGNOSIS — N186 End stage renal disease: Secondary | ICD-10-CM | POA: Diagnosis not present

## 2018-02-02 DIAGNOSIS — N2581 Secondary hyperparathyroidism of renal origin: Secondary | ICD-10-CM | POA: Diagnosis not present

## 2018-02-02 DIAGNOSIS — D631 Anemia in chronic kidney disease: Secondary | ICD-10-CM | POA: Diagnosis not present

## 2018-02-04 DIAGNOSIS — N2581 Secondary hyperparathyroidism of renal origin: Secondary | ICD-10-CM | POA: Diagnosis not present

## 2018-02-04 DIAGNOSIS — Z992 Dependence on renal dialysis: Secondary | ICD-10-CM | POA: Diagnosis not present

## 2018-02-04 DIAGNOSIS — D509 Iron deficiency anemia, unspecified: Secondary | ICD-10-CM | POA: Diagnosis not present

## 2018-02-04 DIAGNOSIS — D631 Anemia in chronic kidney disease: Secondary | ICD-10-CM | POA: Diagnosis not present

## 2018-02-04 DIAGNOSIS — N186 End stage renal disease: Secondary | ICD-10-CM | POA: Diagnosis not present

## 2018-02-06 DIAGNOSIS — I69311 Memory deficit following cerebral infarction: Secondary | ICD-10-CM | POA: Diagnosis not present

## 2018-02-06 DIAGNOSIS — Z992 Dependence on renal dialysis: Secondary | ICD-10-CM | POA: Diagnosis not present

## 2018-02-06 DIAGNOSIS — I69398 Other sequelae of cerebral infarction: Secondary | ICD-10-CM | POA: Diagnosis not present

## 2018-02-06 DIAGNOSIS — N185 Chronic kidney disease, stage 5: Secondary | ICD-10-CM | POA: Diagnosis not present

## 2018-02-06 DIAGNOSIS — I120 Hypertensive chronic kidney disease with stage 5 chronic kidney disease or end stage renal disease: Secondary | ICD-10-CM | POA: Diagnosis not present

## 2018-02-06 DIAGNOSIS — R2689 Other abnormalities of gait and mobility: Secondary | ICD-10-CM | POA: Diagnosis not present

## 2018-02-07 DIAGNOSIS — Z992 Dependence on renal dialysis: Secondary | ICD-10-CM | POA: Diagnosis not present

## 2018-02-07 DIAGNOSIS — D631 Anemia in chronic kidney disease: Secondary | ICD-10-CM | POA: Diagnosis not present

## 2018-02-07 DIAGNOSIS — N186 End stage renal disease: Secondary | ICD-10-CM | POA: Diagnosis not present

## 2018-02-07 DIAGNOSIS — D509 Iron deficiency anemia, unspecified: Secondary | ICD-10-CM | POA: Diagnosis not present

## 2018-02-07 DIAGNOSIS — N2581 Secondary hyperparathyroidism of renal origin: Secondary | ICD-10-CM | POA: Diagnosis not present

## 2018-02-09 DIAGNOSIS — D631 Anemia in chronic kidney disease: Secondary | ICD-10-CM | POA: Diagnosis not present

## 2018-02-09 DIAGNOSIS — N186 End stage renal disease: Secondary | ICD-10-CM | POA: Diagnosis not present

## 2018-02-09 DIAGNOSIS — N2581 Secondary hyperparathyroidism of renal origin: Secondary | ICD-10-CM | POA: Diagnosis not present

## 2018-02-09 DIAGNOSIS — Z992 Dependence on renal dialysis: Secondary | ICD-10-CM | POA: Diagnosis not present

## 2018-02-09 DIAGNOSIS — D509 Iron deficiency anemia, unspecified: Secondary | ICD-10-CM | POA: Diagnosis not present

## 2018-02-11 DIAGNOSIS — D509 Iron deficiency anemia, unspecified: Secondary | ICD-10-CM | POA: Diagnosis not present

## 2018-02-11 DIAGNOSIS — N2581 Secondary hyperparathyroidism of renal origin: Secondary | ICD-10-CM | POA: Diagnosis not present

## 2018-02-11 DIAGNOSIS — D631 Anemia in chronic kidney disease: Secondary | ICD-10-CM | POA: Diagnosis not present

## 2018-02-11 DIAGNOSIS — Z992 Dependence on renal dialysis: Secondary | ICD-10-CM | POA: Diagnosis not present

## 2018-02-11 DIAGNOSIS — N186 End stage renal disease: Secondary | ICD-10-CM | POA: Diagnosis not present

## 2018-02-14 DIAGNOSIS — D509 Iron deficiency anemia, unspecified: Secondary | ICD-10-CM | POA: Diagnosis not present

## 2018-02-14 DIAGNOSIS — D631 Anemia in chronic kidney disease: Secondary | ICD-10-CM | POA: Diagnosis not present

## 2018-02-14 DIAGNOSIS — Z992 Dependence on renal dialysis: Secondary | ICD-10-CM | POA: Diagnosis not present

## 2018-02-14 DIAGNOSIS — N2581 Secondary hyperparathyroidism of renal origin: Secondary | ICD-10-CM | POA: Diagnosis not present

## 2018-02-14 DIAGNOSIS — N186 End stage renal disease: Secondary | ICD-10-CM | POA: Diagnosis not present

## 2018-02-16 DIAGNOSIS — Z992 Dependence on renal dialysis: Secondary | ICD-10-CM | POA: Diagnosis not present

## 2018-02-16 DIAGNOSIS — N2581 Secondary hyperparathyroidism of renal origin: Secondary | ICD-10-CM | POA: Diagnosis not present

## 2018-02-16 DIAGNOSIS — D509 Iron deficiency anemia, unspecified: Secondary | ICD-10-CM | POA: Diagnosis not present

## 2018-02-16 DIAGNOSIS — D631 Anemia in chronic kidney disease: Secondary | ICD-10-CM | POA: Diagnosis not present

## 2018-02-16 DIAGNOSIS — N186 End stage renal disease: Secondary | ICD-10-CM | POA: Diagnosis not present

## 2018-02-18 DIAGNOSIS — Z992 Dependence on renal dialysis: Secondary | ICD-10-CM | POA: Diagnosis not present

## 2018-02-18 DIAGNOSIS — D509 Iron deficiency anemia, unspecified: Secondary | ICD-10-CM | POA: Diagnosis not present

## 2018-02-18 DIAGNOSIS — N186 End stage renal disease: Secondary | ICD-10-CM | POA: Diagnosis not present

## 2018-02-18 DIAGNOSIS — D631 Anemia in chronic kidney disease: Secondary | ICD-10-CM | POA: Diagnosis not present

## 2018-02-18 DIAGNOSIS — N2581 Secondary hyperparathyroidism of renal origin: Secondary | ICD-10-CM | POA: Diagnosis not present

## 2018-02-21 DIAGNOSIS — N186 End stage renal disease: Secondary | ICD-10-CM | POA: Diagnosis not present

## 2018-02-21 DIAGNOSIS — D509 Iron deficiency anemia, unspecified: Secondary | ICD-10-CM | POA: Diagnosis not present

## 2018-02-21 DIAGNOSIS — D631 Anemia in chronic kidney disease: Secondary | ICD-10-CM | POA: Diagnosis not present

## 2018-02-21 DIAGNOSIS — N2581 Secondary hyperparathyroidism of renal origin: Secondary | ICD-10-CM | POA: Diagnosis not present

## 2018-02-21 DIAGNOSIS — Z992 Dependence on renal dialysis: Secondary | ICD-10-CM | POA: Diagnosis not present

## 2018-02-23 DIAGNOSIS — N186 End stage renal disease: Secondary | ICD-10-CM | POA: Diagnosis not present

## 2018-02-23 DIAGNOSIS — Z992 Dependence on renal dialysis: Secondary | ICD-10-CM | POA: Diagnosis not present

## 2018-02-23 DIAGNOSIS — D631 Anemia in chronic kidney disease: Secondary | ICD-10-CM | POA: Diagnosis not present

## 2018-02-23 DIAGNOSIS — D509 Iron deficiency anemia, unspecified: Secondary | ICD-10-CM | POA: Diagnosis not present

## 2018-02-23 DIAGNOSIS — N2581 Secondary hyperparathyroidism of renal origin: Secondary | ICD-10-CM | POA: Diagnosis not present

## 2018-02-25 DIAGNOSIS — D509 Iron deficiency anemia, unspecified: Secondary | ICD-10-CM | POA: Diagnosis not present

## 2018-02-25 DIAGNOSIS — N2581 Secondary hyperparathyroidism of renal origin: Secondary | ICD-10-CM | POA: Diagnosis not present

## 2018-02-25 DIAGNOSIS — D631 Anemia in chronic kidney disease: Secondary | ICD-10-CM | POA: Diagnosis not present

## 2018-02-25 DIAGNOSIS — Z992 Dependence on renal dialysis: Secondary | ICD-10-CM | POA: Diagnosis not present

## 2018-02-25 DIAGNOSIS — N186 End stage renal disease: Secondary | ICD-10-CM | POA: Diagnosis not present

## 2018-02-28 DIAGNOSIS — D509 Iron deficiency anemia, unspecified: Secondary | ICD-10-CM | POA: Diagnosis not present

## 2018-02-28 DIAGNOSIS — Z992 Dependence on renal dialysis: Secondary | ICD-10-CM | POA: Diagnosis not present

## 2018-02-28 DIAGNOSIS — N186 End stage renal disease: Secondary | ICD-10-CM | POA: Diagnosis not present

## 2018-02-28 DIAGNOSIS — D631 Anemia in chronic kidney disease: Secondary | ICD-10-CM | POA: Diagnosis not present

## 2018-02-28 DIAGNOSIS — N2581 Secondary hyperparathyroidism of renal origin: Secondary | ICD-10-CM | POA: Diagnosis not present

## 2018-03-01 DIAGNOSIS — Z992 Dependence on renal dialysis: Secondary | ICD-10-CM | POA: Diagnosis not present

## 2018-03-01 DIAGNOSIS — N186 End stage renal disease: Secondary | ICD-10-CM | POA: Diagnosis not present

## 2018-03-02 DIAGNOSIS — Z992 Dependence on renal dialysis: Secondary | ICD-10-CM | POA: Diagnosis not present

## 2018-03-02 DIAGNOSIS — D631 Anemia in chronic kidney disease: Secondary | ICD-10-CM | POA: Diagnosis not present

## 2018-03-02 DIAGNOSIS — N186 End stage renal disease: Secondary | ICD-10-CM | POA: Diagnosis not present

## 2018-03-02 DIAGNOSIS — D509 Iron deficiency anemia, unspecified: Secondary | ICD-10-CM | POA: Diagnosis not present

## 2018-03-02 DIAGNOSIS — N2581 Secondary hyperparathyroidism of renal origin: Secondary | ICD-10-CM | POA: Diagnosis not present

## 2018-03-03 ENCOUNTER — Other Ambulatory Visit: Payer: Self-pay

## 2018-03-04 DIAGNOSIS — N2581 Secondary hyperparathyroidism of renal origin: Secondary | ICD-10-CM | POA: Diagnosis not present

## 2018-03-04 DIAGNOSIS — D631 Anemia in chronic kidney disease: Secondary | ICD-10-CM | POA: Diagnosis not present

## 2018-03-04 DIAGNOSIS — D509 Iron deficiency anemia, unspecified: Secondary | ICD-10-CM | POA: Diagnosis not present

## 2018-03-04 DIAGNOSIS — N186 End stage renal disease: Secondary | ICD-10-CM | POA: Diagnosis not present

## 2018-03-04 DIAGNOSIS — Z992 Dependence on renal dialysis: Secondary | ICD-10-CM | POA: Diagnosis not present

## 2018-03-07 DIAGNOSIS — D631 Anemia in chronic kidney disease: Secondary | ICD-10-CM | POA: Diagnosis not present

## 2018-03-07 DIAGNOSIS — D509 Iron deficiency anemia, unspecified: Secondary | ICD-10-CM | POA: Diagnosis not present

## 2018-03-07 DIAGNOSIS — N2581 Secondary hyperparathyroidism of renal origin: Secondary | ICD-10-CM | POA: Diagnosis not present

## 2018-03-07 DIAGNOSIS — Z992 Dependence on renal dialysis: Secondary | ICD-10-CM | POA: Diagnosis not present

## 2018-03-07 DIAGNOSIS — N186 End stage renal disease: Secondary | ICD-10-CM | POA: Diagnosis not present

## 2018-03-09 DIAGNOSIS — D509 Iron deficiency anemia, unspecified: Secondary | ICD-10-CM | POA: Diagnosis not present

## 2018-03-09 DIAGNOSIS — D631 Anemia in chronic kidney disease: Secondary | ICD-10-CM | POA: Diagnosis not present

## 2018-03-09 DIAGNOSIS — N2581 Secondary hyperparathyroidism of renal origin: Secondary | ICD-10-CM | POA: Diagnosis not present

## 2018-03-09 DIAGNOSIS — Z992 Dependence on renal dialysis: Secondary | ICD-10-CM | POA: Diagnosis not present

## 2018-03-09 DIAGNOSIS — N186 End stage renal disease: Secondary | ICD-10-CM | POA: Diagnosis not present

## 2018-03-11 DIAGNOSIS — N186 End stage renal disease: Secondary | ICD-10-CM | POA: Diagnosis not present

## 2018-03-11 DIAGNOSIS — D509 Iron deficiency anemia, unspecified: Secondary | ICD-10-CM | POA: Diagnosis not present

## 2018-03-11 DIAGNOSIS — D631 Anemia in chronic kidney disease: Secondary | ICD-10-CM | POA: Diagnosis not present

## 2018-03-11 DIAGNOSIS — N2581 Secondary hyperparathyroidism of renal origin: Secondary | ICD-10-CM | POA: Diagnosis not present

## 2018-03-11 DIAGNOSIS — Z992 Dependence on renal dialysis: Secondary | ICD-10-CM | POA: Diagnosis not present

## 2018-03-14 DIAGNOSIS — D509 Iron deficiency anemia, unspecified: Secondary | ICD-10-CM | POA: Diagnosis not present

## 2018-03-14 DIAGNOSIS — N2581 Secondary hyperparathyroidism of renal origin: Secondary | ICD-10-CM | POA: Diagnosis not present

## 2018-03-14 DIAGNOSIS — D631 Anemia in chronic kidney disease: Secondary | ICD-10-CM | POA: Diagnosis not present

## 2018-03-14 DIAGNOSIS — N186 End stage renal disease: Secondary | ICD-10-CM | POA: Diagnosis not present

## 2018-03-14 DIAGNOSIS — Z992 Dependence on renal dialysis: Secondary | ICD-10-CM | POA: Diagnosis not present

## 2018-03-15 DIAGNOSIS — R9389 Abnormal findings on diagnostic imaging of other specified body structures: Secondary | ICD-10-CM | POA: Diagnosis not present

## 2018-03-15 DIAGNOSIS — M79605 Pain in left leg: Secondary | ICD-10-CM | POA: Diagnosis not present

## 2018-03-15 DIAGNOSIS — N186 End stage renal disease: Secondary | ICD-10-CM | POA: Diagnosis not present

## 2018-03-15 DIAGNOSIS — M79604 Pain in right leg: Secondary | ICD-10-CM | POA: Diagnosis not present

## 2018-03-16 DIAGNOSIS — D509 Iron deficiency anemia, unspecified: Secondary | ICD-10-CM | POA: Diagnosis not present

## 2018-03-16 DIAGNOSIS — N2581 Secondary hyperparathyroidism of renal origin: Secondary | ICD-10-CM | POA: Diagnosis not present

## 2018-03-16 DIAGNOSIS — Z992 Dependence on renal dialysis: Secondary | ICD-10-CM | POA: Diagnosis not present

## 2018-03-16 DIAGNOSIS — D631 Anemia in chronic kidney disease: Secondary | ICD-10-CM | POA: Diagnosis not present

## 2018-03-16 DIAGNOSIS — N186 End stage renal disease: Secondary | ICD-10-CM | POA: Diagnosis not present

## 2018-03-18 DIAGNOSIS — N2581 Secondary hyperparathyroidism of renal origin: Secondary | ICD-10-CM | POA: Diagnosis not present

## 2018-03-18 DIAGNOSIS — D509 Iron deficiency anemia, unspecified: Secondary | ICD-10-CM | POA: Diagnosis not present

## 2018-03-18 DIAGNOSIS — D631 Anemia in chronic kidney disease: Secondary | ICD-10-CM | POA: Diagnosis not present

## 2018-03-18 DIAGNOSIS — N186 End stage renal disease: Secondary | ICD-10-CM | POA: Diagnosis not present

## 2018-03-18 DIAGNOSIS — Z992 Dependence on renal dialysis: Secondary | ICD-10-CM | POA: Diagnosis not present

## 2018-03-21 DIAGNOSIS — D509 Iron deficiency anemia, unspecified: Secondary | ICD-10-CM | POA: Diagnosis not present

## 2018-03-21 DIAGNOSIS — N186 End stage renal disease: Secondary | ICD-10-CM | POA: Diagnosis not present

## 2018-03-21 DIAGNOSIS — Z992 Dependence on renal dialysis: Secondary | ICD-10-CM | POA: Diagnosis not present

## 2018-03-21 DIAGNOSIS — N2581 Secondary hyperparathyroidism of renal origin: Secondary | ICD-10-CM | POA: Diagnosis not present

## 2018-03-21 DIAGNOSIS — D631 Anemia in chronic kidney disease: Secondary | ICD-10-CM | POA: Diagnosis not present

## 2018-03-23 DIAGNOSIS — N2581 Secondary hyperparathyroidism of renal origin: Secondary | ICD-10-CM | POA: Diagnosis not present

## 2018-03-23 DIAGNOSIS — D509 Iron deficiency anemia, unspecified: Secondary | ICD-10-CM | POA: Diagnosis not present

## 2018-03-23 DIAGNOSIS — N186 End stage renal disease: Secondary | ICD-10-CM | POA: Diagnosis not present

## 2018-03-23 DIAGNOSIS — D631 Anemia in chronic kidney disease: Secondary | ICD-10-CM | POA: Diagnosis not present

## 2018-03-23 DIAGNOSIS — Z992 Dependence on renal dialysis: Secondary | ICD-10-CM | POA: Diagnosis not present

## 2018-03-25 DIAGNOSIS — N186 End stage renal disease: Secondary | ICD-10-CM | POA: Diagnosis not present

## 2018-03-25 DIAGNOSIS — Z992 Dependence on renal dialysis: Secondary | ICD-10-CM | POA: Diagnosis not present

## 2018-03-25 DIAGNOSIS — D509 Iron deficiency anemia, unspecified: Secondary | ICD-10-CM | POA: Diagnosis not present

## 2018-03-25 DIAGNOSIS — D631 Anemia in chronic kidney disease: Secondary | ICD-10-CM | POA: Diagnosis not present

## 2018-03-25 DIAGNOSIS — N2581 Secondary hyperparathyroidism of renal origin: Secondary | ICD-10-CM | POA: Diagnosis not present

## 2018-03-28 ENCOUNTER — Other Ambulatory Visit: Payer: Self-pay | Admitting: *Deleted

## 2018-03-28 ENCOUNTER — Other Ambulatory Visit: Payer: Self-pay

## 2018-03-28 ENCOUNTER — Encounter: Payer: Self-pay | Admitting: *Deleted

## 2018-03-28 ENCOUNTER — Ambulatory Visit (INDEPENDENT_AMBULATORY_CARE_PROVIDER_SITE_OTHER): Payer: Medicare Other | Admitting: Vascular Surgery

## 2018-03-28 ENCOUNTER — Encounter: Payer: Self-pay | Admitting: Vascular Surgery

## 2018-03-28 DIAGNOSIS — I739 Peripheral vascular disease, unspecified: Secondary | ICD-10-CM | POA: Diagnosis not present

## 2018-03-28 DIAGNOSIS — Z992 Dependence on renal dialysis: Secondary | ICD-10-CM | POA: Diagnosis not present

## 2018-03-28 DIAGNOSIS — N186 End stage renal disease: Secondary | ICD-10-CM | POA: Diagnosis not present

## 2018-03-28 DIAGNOSIS — D509 Iron deficiency anemia, unspecified: Secondary | ICD-10-CM | POA: Diagnosis not present

## 2018-03-28 DIAGNOSIS — D631 Anemia in chronic kidney disease: Secondary | ICD-10-CM | POA: Diagnosis not present

## 2018-03-28 DIAGNOSIS — N2581 Secondary hyperparathyroidism of renal origin: Secondary | ICD-10-CM | POA: Diagnosis not present

## 2018-03-28 NOTE — Progress Notes (Signed)
Patient name: Sonya [name] MRN: [id number] DOB: March 14, 1951 Sex: female  REASON FOR CONSULT: Rest pain bilateral lower extremities  HPI: Sonya [name] is a 67 y.o. female with end-stage renal disease on hemodialysis, hypertension, hyperlipidemia, and previous CVA that presents for evaluation of rest pain in her bilateral lower extremities.  Patient states that she has had pain in her bilateral calfs and feet for the last several months that has been worsening.  She states it keeps her up all night long she is been having significant difficulty sleeping.  Pain is all the time. Both feet are equally painful.  She states she has to get up out of bed and dangle her legs.  She denies any previous lower extremity interventions and denies a previous arteriogram for diagnostic purposes.  She currently walks with a walker following her stroke but remains functional according to her family.  She denies tobacco abuse.  She denies being a diabetic.  No tissue loss at this time.    She had noninvasive imaging that was done on 03/15/2018 that shows monophasic arterial waveforms in her right lower extremity with a toe brachial index of 0.18.  She had biphasic dorsalis pedis waveform in the left with a occluded posterior tibial and a toe brachial index of 0.26.  Past Medical History:  Diagnosis Date  . Anemia   . Anxiety   . CKD (chronic kidney disease)   . Diverticulitis   . Dysphagia   . ESRD (end stage renal disease) ([place])    On HD  . Fracture of femoral neck, left, closed ([place]) 05/30/2015  . Gastritis 06/2013 & 05/2015  . Gastroesophageal reflux disease with stricture 05/2015  . GERD (gastroesophageal reflux disease)   . HTN (hypertension)   . Hyperparathyroidism due to renal insufficiency ([place])   . Hypertensive urgency 06/2013 & 04/2015  . Hypothyroidism   . ICH (intracerebral hemorrhage) ([place]) 10/2014  . Meningocele ([place])   . Protein calorie malnutrition ([place])   . Stroke ([place])   . Uterine  cancer ([place]) 2012  . Vertical diplopia   . Vertigo     Past Surgical History:  Procedure Laterality Date  . ABDOMINAL HYSTERECTOMY    . APPENDECTOMY    . BASCILIC VEIN TRANSPOSITION Right 09/17/2013   Procedure: BRACHIAL VEIN TRANSPOSITION;  Surgeon: Conrad [place], MD;  Location: [facility];  Service: Vascular;  Laterality: Right;  . BASCILIC VEIN TRANSPOSITION Right 10/29/2013   Procedure: RIGHT SECOND STAGE BRACHIAL VEIN TRANSPOSITION;  Surgeon: Conrad [place], MD;  Location: [facility];  Service: Vascular;  Laterality: Right;  . CESAREAN SECTION    . CHOLECYSTECTOMY    . COLONOSCOPY  2000   TICS, IH  . COLONOSCOPY  2003 NUR BRBPR D50 V6   DC/[place] TICS, IH  . COLONOSCOPY N/A 09/04/2013   SLF: Small ulcer in the descending colon, one colon polyp (tubular adenoma), large internal hemorrhoids, moderate diverticulosis. next tcs 10 years.  . ESOPHAGOGASTRODUODENOSCOPY N/A 05/23/2015   BTD:VVOH non-erosive gastritis/stricture at the gastroesophageal junction  . ESOPHAGOGASTRODUODENOSCOPY (EGD) WITH ESOPHAGEAL DILATION N/A 06/08/2013   Dr. Fields:Stricture at the gastroesophageal junction/MILD NON-erosive gastritis (inflammation) was found in the gastric antrum; multiple biopsies/NO SOURCE FOR ANEMIA IDETIFIED-MOST LIKELY DUE TO ANEMIA OF CHRONIC DISEASE  . FLEXIBLE SIGMOIDOSCOPY N/A 01/02/2016   Dr. [place] Alar: external hemorrhoids, diverticulosis, internal hemorrhoids, proximal rectum normal  . GIVENS CAPSULE STUDY N/A 12/03/2013   SLF: occasional gastric erosion. small bowel normal  . HIP PINNING,CANNULATED Left 05/31/2015  Procedure: CANNULATED HIP PINNING;  Surgeon: Leandrew Koyanagi, MD;  Location: [facility];  Service: Orthopedics;  Laterality: Left;  . INSERTION OF DIALYSIS CATHETER Left 09/17/2013   Procedure: INSERTION OF DIALYSIS CATHETER;  Surgeon: Conrad [place], MD;  Location: [facility];  Service: Vascular;  Laterality: Left;  . LAPAROSCOPIC TOTAL HYSTERECTOMY    . NASAL SEPTUM SURGERY    . SAVORY DILATION N/A  05/23/2015   Procedure: SAVORY DILATION;  Surgeon: Danie Binder, MD;  [facility];  Service: Endoscopy;  Laterality: N/A;  . THROMBECTOMY AND REVISION OF ARTERIOVENTOUS (AV) GORETEX  GRAFT Right 07/06/2017   Procedure: THROMBECTOMY AND REVISION OF ARTERIOVENOUS (AV) GORETEX  GRAFT RIGHT UPPER ARM;  Surgeon: Rosetta Posner, MD;  Location[facility];  Service: Vascular;  Laterality: Right;    Family History  Problem Relation Age of Onset  . Cancer Mother   . Diabetes Mother   . Hypertension Mother   . Diabetes Father   . Hypertension Father   . Hypertension Sister   . Colon cancer Neg Hx     SOCIAL HISTORY: Social History   Socioeconomic History  . Marital status: Widowed    Spouse name: Not on file  . Number of children: Not on file  . Years of education: Not on file  . Highest education level: Not on file  Occupational History  . Not on file  Social Needs  . Financial resource strain: Not on file  . Food insecurity:    Worry: Not on file    Inability: Not on file  . Transportation needs:    Medical: Not on file    Non-medical: Not on file  Tobacco Use  . Smoking status: Never Smoker  . Smokeless tobacco: Never Used  Substance and Sexual Activity  . Alcohol use: No  . Drug use: No  . Sexual activity: Not on file  Lifestyle  . Physical activity:    Days per week: Not on file    Minutes per session: Not on file  . Stress: Not on file  Relationships  . Social connections:    Talks on phone: Not on file    Gets together: Not on file    Attends religious service: Not on file    Active member of club or organization: Not on file    Attends meetings of clubs or organizations: Not on file    Relationship status: Not on file  . Intimate partner violence:    Fear of current or ex partner: Not on file    Emotionally abused: Not on file    Physically abused: Not on file    Forced sexual activity: Not on file  Other Topics Concern  . Not on file  Social  History Narrative   WORKS AS A CNA. SHE IS [name].    Allergies  Allergen Reactions  . Cephalexin Itching    Current Outpatient Medications  Medication Sig Dispense Refill  . ALLOPURINOL PO Take by mouth.    . ALPRAZolam (XANAX) 0.5 MG tablet Take 1 tablet (0.5 mg total) by mouth 2 (two) times daily as needed for anxiety. 10 tablet 0  . aspirin EC 81 MG EC tablet Take 1 tablet (81 mg total) by mouth daily. 30 tablet 1  . atorvastatin (LIPITOR) 40 MG tablet Take 1 tablet (40 mg total) by mouth daily at 6 PM. 30 tablet 1  . Carboxymethylcellulose Sodium (THERATEARS) 0.25 % SOLN Apply 1-2 drops to eye daily  as needed (for eye irritation).    . folic acid (FOLVITE) 1 MG tablet Take 2 mg by mouth daily.    Marland Kitchen gabapentin (NEURONTIN) 100 MG capsule Take 100 mg by mouth at bedtime.    Marland Kitchen levothyroxine (SYNTHROID, LEVOTHROID) 50 MCG tablet Take 1 tablet (50 mcg total) by mouth daily. (Patient taking differently: Take 50 mcg by mouth daily before breakfast. ) 30 tablet 0  . lidocaine-prilocaine (EMLA) cream Apply 1 application topically every Tuesday, Thursday, and Saturday at 6 PM.     . NIFEdipine (ADALAT CC) 90 MG 24 hr tablet 90 mg daily.  3  . NIFEdipine (PROCARDIA-XL/ADALAT CC) 30 MG 24 hr tablet Take 1 tablet (30 mg total) by mouth at bedtime. 30 tablet 0  . pentoxifylline (TRENTAL) 400 MG CR tablet Take 400 mg by mouth 3 (three) times daily with meals.  11  . Vitamin D, Ergocalciferol, (DRISDOL) 50000 units CAPS capsule Take 50,000 Units by mouth every 7 (seven) days.     No current facility-administered medications for this visit.     REVIEW OF SYSTEMS:  [X]  denotes positive finding, [ ]  denotes negative finding Cardiac  Comments:  Chest pain or chest pressure:    Shortness of breath upon exertion:    Short of breath when lying flat:    Irregular heart rhythm:        Vascular    Pain in calf, thigh, or hip brought on by ambulation: x   Pain in feet at night that  wakes you up from your sleep:  x   Blood clot in your veins:    Leg swelling:         Pulmonary    Oxygen at home:    Productive cough:     Wheezing:         Neurologic    Sudden weakness in arms or legs:     Sudden numbness in arms or legs:     Sudden onset of difficulty speaking or slurred speech:    Temporary loss of vision in one eye:     Problems with dizziness:         Gastrointestinal    Blood in stool:     Vomited blood:         Genitourinary    Burning when urinating:     Blood in urine:        Psychiatric    Major depression:         Hematologic    Bleeding problems:    Problems with blood clotting too easily:        Skin    Rashes or ulcers:        Constitutional    Fever or chills:      PHYSICAL EXAM: Vitals:   03/28/18 1227  BP: 115/62  Pulse: 79  Resp: 18  Temp: 98.5 F (36.9 C)  TempSrc: Oral  SpO2: 100%  Weight: 66.7 kg  Height: 5\' 5"  (1.651 m)    GENERAL: The patient is a well-nourished female, in no acute distress. The vital signs are documented above. CARDIAC: There is a regular rate and rhythm.  VASCULAR:  2+ palpable radial pulse to the bilateral upper extremities. 1+ palpable femoral pulses to the bilateral groins. No palpable popliteal pulses bilaterally. Monophasic right dorsalis pedis and posterior tibial signal.  No tissue loss in the right foot. Biphasic left dorsalis pedis signal and no posterior tibial signal.  No tissue loss in the left foot. PULMONARY:  There is good air exchange bilaterally without wheezing or rales. ABDOMEN: Soft and non-tender with normal pitched bowel sounds.  MUSCULOSKELETAL: There are no major deformities or cyanosis. NEUROLOGIC: No focal weakness or paresthesias are detected. SKIN: There are no ulcers or rashes noted. PSYCHIATRIC: The patient has a normal affect.  DATA:   In review of her noninvasive imaging from [facility] she has a right ABI of 1.27 with a toe brachial index of 0.18 and  monophasic waveforms at the ankle.  On the left they report an ABI of 0.78 with a toe brachial index of 0.26 and biphasic dorsalis pedis waveforms and an occluded posterior tibial.  Suspect that her ABIs were noncompressible and are inaccurate.  Assessment/Plan:  In talking with Ms. Dalgleish she describes severe rest pain in her bilateral lower extremities.  I suspect her ABIs were noncompressible at the outside hospital and she has severely reduced toe brachial index in both feet.  We have recommended aortogram with bilateral lower extremity arteriogram to further evaluate.  We discussed accessing her left groin and intervening on her right leg first given she has more blunted waveforms distally on the right with a more severely reduced toe brachial index.  Discussed that if she has an endovascular option on the left we will have to bring her back another time to access the other groin and work on the left leg.  The patient is agreeable to move forward.  We will post for the next available Wednesday given she dialyzes Tuesday Thursday Saturday.  She is already on aspirin and Lipitor and does not smoke.    Marty Heck, MD [facility] Office: [phone] Pager: [place]

## 2018-03-30 ENCOUNTER — Encounter: Payer: Self-pay | Admitting: Internal Medicine

## 2018-03-30 DIAGNOSIS — D509 Iron deficiency anemia, unspecified: Secondary | ICD-10-CM | POA: Diagnosis not present

## 2018-03-30 DIAGNOSIS — Z992 Dependence on renal dialysis: Secondary | ICD-10-CM | POA: Diagnosis not present

## 2018-03-30 DIAGNOSIS — N186 End stage renal disease: Secondary | ICD-10-CM | POA: Diagnosis not present

## 2018-03-30 DIAGNOSIS — N2581 Secondary hyperparathyroidism of renal origin: Secondary | ICD-10-CM | POA: Diagnosis not present

## 2018-03-30 DIAGNOSIS — D631 Anemia in chronic kidney disease: Secondary | ICD-10-CM | POA: Diagnosis not present

## 2018-04-01 DIAGNOSIS — N2581 Secondary hyperparathyroidism of renal origin: Secondary | ICD-10-CM | POA: Diagnosis not present

## 2018-04-01 DIAGNOSIS — N186 End stage renal disease: Secondary | ICD-10-CM | POA: Diagnosis not present

## 2018-04-01 DIAGNOSIS — Z992 Dependence on renal dialysis: Secondary | ICD-10-CM | POA: Diagnosis not present

## 2018-04-01 DIAGNOSIS — D631 Anemia in chronic kidney disease: Secondary | ICD-10-CM | POA: Diagnosis not present

## 2018-04-01 DIAGNOSIS — D509 Iron deficiency anemia, unspecified: Secondary | ICD-10-CM | POA: Diagnosis not present

## 2018-04-04 DIAGNOSIS — D509 Iron deficiency anemia, unspecified: Secondary | ICD-10-CM | POA: Diagnosis not present

## 2018-04-04 DIAGNOSIS — N186 End stage renal disease: Secondary | ICD-10-CM | POA: Diagnosis not present

## 2018-04-04 DIAGNOSIS — Z992 Dependence on renal dialysis: Secondary | ICD-10-CM | POA: Diagnosis not present

## 2018-04-04 DIAGNOSIS — Z23 Encounter for immunization: Secondary | ICD-10-CM | POA: Diagnosis not present

## 2018-04-04 DIAGNOSIS — D631 Anemia in chronic kidney disease: Secondary | ICD-10-CM | POA: Diagnosis not present

## 2018-04-05 ENCOUNTER — Other Ambulatory Visit: Payer: Self-pay

## 2018-04-05 ENCOUNTER — Ambulatory Visit (HOSPITAL_COMMUNITY)
Admission: RE | Admit: 2018-04-05 | Discharge: 2018-04-05 | Disposition: A | Discharge status: Home / Self Care | Payer: Medicare Other | Source: Ambulatory Visit | Attending: Vascular Surgery | Admitting: Vascular Surgery

## 2018-04-05 ENCOUNTER — Encounter (HOSPITAL_COMMUNITY)
Admission: RE | Disposition: A | Discharge status: Home / Self Care | Payer: Self-pay | Source: Ambulatory Visit | Attending: Vascular Surgery

## 2018-04-05 DIAGNOSIS — Z9889 Other specified postprocedural states: Secondary | ICD-10-CM | POA: Insufficient documentation

## 2018-04-05 DIAGNOSIS — H532 Diplopia: Secondary | ICD-10-CM | POA: Insufficient documentation

## 2018-04-05 DIAGNOSIS — F419 Anxiety disorder, unspecified: Secondary | ICD-10-CM | POA: Diagnosis not present

## 2018-04-05 DIAGNOSIS — I120 Hypertensive chronic kidney disease with stage 5 chronic kidney disease or end stage renal disease: Secondary | ICD-10-CM | POA: Diagnosis not present

## 2018-04-05 DIAGNOSIS — N2581 Secondary hyperparathyroidism of renal origin: Secondary | ICD-10-CM | POA: Diagnosis not present

## 2018-04-05 DIAGNOSIS — I70223 Atherosclerosis of native arteries of extremities with rest pain, bilateral legs: Secondary | ICD-10-CM | POA: Insufficient documentation

## 2018-04-05 DIAGNOSIS — Z79899 Other long term (current) drug therapy: Secondary | ICD-10-CM | POA: Insufficient documentation

## 2018-04-05 DIAGNOSIS — Z7982 Long term (current) use of aspirin: Secondary | ICD-10-CM | POA: Diagnosis not present

## 2018-04-05 DIAGNOSIS — Z79890 Hormone replacement therapy (postmenopausal): Secondary | ICD-10-CM | POA: Diagnosis not present

## 2018-04-05 DIAGNOSIS — Z881 Allergy status to other antibiotic agents status: Secondary | ICD-10-CM | POA: Insufficient documentation

## 2018-04-05 DIAGNOSIS — Z8249 Family history of ischemic heart disease and other diseases of the circulatory system: Secondary | ICD-10-CM | POA: Diagnosis not present

## 2018-04-05 DIAGNOSIS — N186 End stage renal disease: Secondary | ICD-10-CM | POA: Insufficient documentation

## 2018-04-05 DIAGNOSIS — K219 Gastro-esophageal reflux disease without esophagitis: Secondary | ICD-10-CM | POA: Insufficient documentation

## 2018-04-05 DIAGNOSIS — Z992 Dependence on renal dialysis: Secondary | ICD-10-CM | POA: Insufficient documentation

## 2018-04-05 DIAGNOSIS — Z8719 Personal history of other diseases of the digestive system: Secondary | ICD-10-CM | POA: Diagnosis not present

## 2018-04-05 DIAGNOSIS — E039 Hypothyroidism, unspecified: Secondary | ICD-10-CM | POA: Insufficient documentation

## 2018-04-05 DIAGNOSIS — Z90710 Acquired absence of both cervix and uterus: Secondary | ICD-10-CM | POA: Insufficient documentation

## 2018-04-05 DIAGNOSIS — Z9049 Acquired absence of other specified parts of digestive tract: Secondary | ICD-10-CM | POA: Diagnosis not present

## 2018-04-05 DIAGNOSIS — Z809 Family history of malignant neoplasm, unspecified: Secondary | ICD-10-CM | POA: Insufficient documentation

## 2018-04-05 DIAGNOSIS — E785 Hyperlipidemia, unspecified: Secondary | ICD-10-CM | POA: Diagnosis not present

## 2018-04-05 DIAGNOSIS — Z8673 Personal history of transient ischemic attack (TIA), and cerebral infarction without residual deficits: Secondary | ICD-10-CM | POA: Insufficient documentation

## 2018-04-05 DIAGNOSIS — Z8542 Personal history of malignant neoplasm of other parts of uterus: Secondary | ICD-10-CM | POA: Diagnosis not present

## 2018-04-05 HISTORY — PX: LOWER EXTREMITY ANGIOGRAPHY: CATH118251

## 2018-04-05 HISTORY — PX: PERIPHERAL VASCULAR ATHERECTOMY: CATH118256

## 2018-04-05 HISTORY — PX: PERIPHERAL VASCULAR BALLOON ANGIOPLASTY: CATH118281

## 2018-04-05 HISTORY — PX: ABDOMINAL AORTOGRAM: CATH118222

## 2018-04-05 LAB — POCT I-STAT, CHEM 8
BUN: 41 mg/dL — AB (ref 8–23)
CHLORIDE: 96 mmol/L — AB (ref 98–111)
CREATININE: 7.7 mg/dL — AB (ref 0.44–1.00)
Calcium, Ion: 1.22 mmol/L (ref 1.15–1.40)
GLUCOSE: 82 mg/dL (ref 70–99)
HEMATOCRIT: 45 % (ref 36.0–46.0)
HEMOGLOBIN: 15.3 g/dL — AB (ref 12.0–15.0)
POTASSIUM: 3.5 mmol/L (ref 3.5–5.1)
Sodium: 134 mmol/L — ABNORMAL LOW (ref 135–145)
TCO2: 27 mmol/L (ref 22–32)

## 2018-04-05 LAB — POCT ACTIVATED CLOTTING TIME
ACTIVATED CLOTTING TIME: 213 s
Activated Clotting Time: 241 seconds

## 2018-04-05 SURGERY — ABDOMINAL AORTOGRAM
Anesthesia: LOCAL | Laterality: Right

## 2018-04-05 MED ORDER — CLOPIDOGREL BISULFATE 300 MG PO TABS
ORAL_TABLET | ORAL | Status: AC
  Filled 2018-04-05: qty 1

## 2018-04-05 MED ORDER — MIDAZOLAM HCL 2 MG/2ML IJ SOLN
INTRAMUSCULAR | Status: AC
  Filled 2018-04-05: qty 2

## 2018-04-05 MED ORDER — CLOPIDOGREL BISULFATE 75 MG PO TABS: 75.0000 mg | ORAL_TABLET | Freq: Every day | ORAL | Status: DC

## 2018-04-05 MED ORDER — SODIUM CHLORIDE 0.9% FLUSH: 3.0000 mL | Freq: Two times a day (BID) | INTRAVENOUS | Status: DC

## 2018-04-05 MED ORDER — IODIXANOL 320 MG/ML IV SOLN
INTRAVENOUS | Status: DC | PRN
  Administered 2018-04-05: 165 mL via INTRAVENOUS

## 2018-04-05 MED ORDER — CLOPIDOGREL BISULFATE 300 MG PO TABS
ORAL_TABLET | ORAL | Status: DC | PRN
  Administered 2018-04-05: 300 mg via ORAL

## 2018-04-05 MED ORDER — LABETALOL HCL 5 MG/ML IV SOLN: 10.0000 mg | INTRAVENOUS | Status: DC | PRN

## 2018-04-05 MED ORDER — VERAPAMIL HCL 2.5 MG/ML IV SOLN
INTRAVENOUS | Status: AC
  Filled 2018-04-05: qty 2

## 2018-04-05 MED ORDER — HYDRALAZINE HCL 20 MG/ML IJ SOLN: 5.0000 mg | INTRAMUSCULAR | Status: DC | PRN

## 2018-04-05 MED ORDER — LIDOCAINE HCL (PF) 1 % IJ SOLN
INTRAMUSCULAR | Status: AC
  Filled 2018-04-05: qty 30

## 2018-04-05 MED ORDER — HEPARIN SODIUM (PORCINE) 1000 UNIT/ML IJ SOLN
INTRAMUSCULAR | Status: DC | PRN
  Administered 2018-04-05: 2000 [IU] via INTRAVENOUS
  Administered 2018-04-05: 6000 [IU] via INTRAVENOUS

## 2018-04-05 MED ORDER — LIDOCAINE HCL (PF) 1 % IJ SOLN
INTRAMUSCULAR | Status: DC | PRN
  Administered 2018-04-05: 15 mL

## 2018-04-05 MED ORDER — SODIUM CHLORIDE 0.9 % IV SOLN: 250.0000 mL | INTRAVENOUS | Status: DC | PRN

## 2018-04-05 MED ORDER — FENTANYL CITRATE (PF) 100 MCG/2ML IJ SOLN
INTRAMUSCULAR | Status: AC
  Filled 2018-04-05: qty 2

## 2018-04-05 MED ORDER — SODIUM CHLORIDE 0.9 % WEIGHT BASED INFUSION: 1.0000 mL/kg/h | INTRAVENOUS | Status: DC

## 2018-04-05 MED ORDER — HEPARIN SODIUM (PORCINE) 1000 UNIT/ML IJ SOLN
INTRAMUSCULAR | Status: AC
  Filled 2018-04-05: qty 1

## 2018-04-05 MED ORDER — NITROGLYCERIN 1 MG/10 ML FOR IR/CATH LAB
INTRA_ARTERIAL | Status: AC
  Filled 2018-04-05: qty 10

## 2018-04-05 MED ORDER — NITROGLYCERIN IN D5W 200-5 MCG/ML-% IV SOLN
INTRAVENOUS | Status: AC
  Filled 2018-04-05: qty 250

## 2018-04-05 MED ORDER — NITROGLYCERIN 1 MG/10 ML FOR IR/CATH LAB
INTRA_ARTERIAL | Status: DC | PRN
  Administered 2018-04-05: 300 mL
  Administered 2018-04-05 (×2): 200 ug

## 2018-04-05 MED ORDER — MIDAZOLAM HCL 2 MG/2ML IJ SOLN
INTRAMUSCULAR | Status: DC | PRN
  Administered 2018-04-05 (×3): 1 mg via INTRAVENOUS

## 2018-04-05 MED ORDER — ACETAMINOPHEN 325 MG PO TABS: 650.0000 mg | ORAL_TABLET | ORAL | Status: DC | PRN

## 2018-04-05 MED ORDER — CLOPIDOGREL BISULFATE 75 MG PO TABS: 300.0000 mg | ORAL_TABLET | Freq: Once | ORAL | Status: DC

## 2018-04-05 MED ORDER — FENTANYL CITRATE (PF) 100 MCG/2ML IJ SOLN
INTRAMUSCULAR | Status: DC | PRN
  Administered 2018-04-05 (×3): 25 ug via INTRAVENOUS

## 2018-04-05 MED ORDER — HEPARIN (PORCINE) IN NACL 1000-0.9 UT/500ML-% IV SOLN
INTRAVENOUS | Status: AC
  Filled 2018-04-05: qty 500

## 2018-04-05 MED ORDER — ONDANSETRON HCL 4 MG/2ML IJ SOLN: 4.0000 mg | Freq: Four times a day (QID) | INTRAMUSCULAR | Status: DC | PRN

## 2018-04-05 MED ORDER — SODIUM CHLORIDE 0.9% FLUSH: 3.0000 mL | INTRAVENOUS | Status: DC | PRN

## 2018-04-05 MED ORDER — CLOPIDOGREL BISULFATE 75 MG PO TABS: 75.0000 mg | ORAL_TABLET | Freq: Every day | ORAL | 11 refills | Status: DC

## 2018-04-05 MED ORDER — VIPERSLIDE LUBRICANT OPTIME
TOPICAL | Status: DC | PRN
  Administered 2018-04-05: 11:00:00 via SURGICAL_CAVITY

## 2018-04-05 MED ORDER — HEPARIN (PORCINE) IN NACL 1000-0.9 UT/500ML-% IV SOLN
INTRAVENOUS | Status: DC | PRN
  Administered 2018-04-05 (×2): 500 mL

## 2018-04-05 MED ORDER — ASPIRIN EC 81 MG PO TBEC: 81.0000 mg | DELAYED_RELEASE_TABLET | Freq: Every day | ORAL | Status: DC

## 2018-04-05 SURGICAL SUPPLY — 22 items
BAG SNAP BAND KOVER 36X36 (MISCELLANEOUS) ×4 IMPLANT
BALLN COYOTE OTW 2X100X150 (BALLOONS) ×4
BALLOON COYOTE OTW 2X100X150 (BALLOONS) ×2 IMPLANT
CATH CXI SUPP ANG 2.6FR 150CM (CATHETERS) ×3 IMPLANT
CATH OMNI FLUSH 5F 65CM (CATHETERS) ×3 IMPLANT
COVER DOME SNAP 22 D (MISCELLANEOUS) ×2 IMPLANT
CROWN STEALTH MICRO-30 1.25MM (CATHETERS) ×3 IMPLANT
DEVICE CLOSURE MYNXGRIP 6/7F (Vascular Products) ×2 IMPLANT
DEVICE TORQUE .014-.018 (MISCELLANEOUS) ×2 IMPLANT
KIT ENCORE 26 ADVANTAGE (KITS) ×3 IMPLANT
KIT MICROPUNCTURE NIT STIFF (SHEATH) ×3 IMPLANT
KIT PV (KITS) ×4 IMPLANT
LUBRICANT VIPERSLIDE CORONARY (MISCELLANEOUS) ×2 IMPLANT
SHEATH FLEX ANSEL ANG 6F 45CM (SHEATH) ×6 IMPLANT
SHEATH PINNACLE 5F 10CM (SHEATH) ×2 IMPLANT
SYR MEDRAD MARK V 150ML (SYRINGE) ×4 IMPLANT
TORQUE DEVICE .014-.018 (MISCELLANEOUS) ×4
TRANSDUCER W/STOPCOCK (MISCELLANEOUS) ×4 IMPLANT
TRAY PV CATH (CUSTOM PROCEDURE TRAY) ×4 IMPLANT
WIRE BENTSON .035X145CM (WIRE) ×3 IMPLANT
WIRE ROSEN-J .035X260CM (WIRE) ×3 IMPLANT
WIRE VIPER WIRECTO 0.014 (WIRE) ×4 IMPLANT

## 2018-04-05 NOTE — Discharge Instructions (Signed)

## 2018-04-05 NOTE — Op Note (Signed)
Patient name: Sonya [name] MRN: [id number] DOB: 1951-06-18 Sex: female  04/05/2018 Pre-operative Diagnosis: Critical limb ischemia of the bilateral lower extremities with rest pain Post-operative diagnosis:  Same Surgeon:  Marty Heck, MD Procedure Performed: 1.  Ultrasound guided access of the left common femoral artery 2.  Aortogram with bilateral lower extremity arteriogram including runoff 3.  Right peroneal atherectomy with balloon angioplasty (1.25 micro CSI atherectomy catheter, 2.0 mm x 100 mm Coyote) 4.  Right posterior tibial artery balloon angioplasty (2.0 mm x 100 mm Coyote) 5.  116 minutes of monitored conscious sedation    Indications: Patient is a 67 year old female who was recently seen in clinic with rest pain of her bilateral lower extremities.  She has end-stage renal disease and had a severely reduced monophasic arterial waveforms in her right lower extremity as well as severely reduced toe brachial index bilaterally.  We recommended aortogram with bilateral lower extremity runoff and possible intervention.  We decided access her left groin and intervene on the right first since she had a lower TBI on the right side and there was concern that she may be developing a wound on the back of her heel.  Findings:  1.  No significant aortoiliac disease 2.  In the right lower extremity she had widely patent SFA and popliteal artery.  It appeared that her anterior tibial artery was occluded shortly after takeoff.  She had a patent peroneal and posterior tibial artery that was heavily calcified and diseased proximally with inflow into the foot.  The posterior tibial artery appeared to have a short chronic total occlusion. 3.  In the left lower extremity she had a widely patent SFA and popliteal artery.  It appeared that she had single vessel tibial runoff via the anterior tibial artery with a high grade stenosis proximally.  Her posterior tibial and peroneal artery  appeared occluded.   Procedure:  The patient was identified in the holding area and taken to room 8.  The patient was then placed supine on the table and prepped and draped in the usual sterile fashion.  A time out was called.  Ultrasound was used to evaluate the left common femoral artery.  It was patent .  A digital ultrasound image was acquired.  A micropuncture needle was used to access the left common femoral artery under ultrasound guidance.  An 018 wire was advanced without resistance and a micropuncture sheath was placed.  The 018 wire was removed and a benson wire was placed.  The micropuncture sheath was exchanged for a 5 french sheath.  An omniflush catheter was advanced over the wire to the level of L-1.  An abdominal angiogram was obtained with bilateral lower extremity runoff.  Next, using the omniflush catheter and a benson wire, the aortic bifurcation was crossed and the catheter was placed into theright external iliac artery.   We then exchanged for a Rosen wire and used added support to place a long 6 [place] Ansell sheath in the left groin.  Patient was given 6000 units of IV heparin at this time and ACTs were monitored throughout the case to maintain between 250 - 300.  We then used a 0.018 a wire with a CXI catheter and crossed into the below-knee popliteal artery.  Initially a wire went down the peroneal artery and we felt confident we were in the true lumen.  As result we ended up atherectomized in the proximal peroneal artery just distal to its takeoff using a 1.25  CSI catheter at low and medium speed after exchanging for Viper wire.  We then used a 2 mm x 100 mm Coyote balloon to angioplasty this lesion.  Nitroglycerin was given throughout the case.  Final injected arteriogram showed significant improvement in the peroneal artery proximally.  There was one minor diseased segment in the mid artery that we got this again with a 2 mm x 100 mm Coyote.  We then turned our attention to the  posterior tibial and using our CSI catheter and a V 18.  Ultimately had to put an aggressive curve on the wire in order to cannulate the posterior tibial artery given its abrupt 90 degree takeoff.  Once we are down the artery we initially felt like we are in the true lumen but upon crossing the short chronic total occlusion did a hand-injection distally with CSI catheter and appear to be in a subintimal plane.  We brought a wire and catheter back injected contrast until we confirmed the true lumen and then on second pass the wire behaved correctly.  Final and injected shot distally showed a CSI catheter was indeed in the true lumen.  We left our wire down and then exchanged and used a 2 mm x 100 mmCoyote balloon and performed angioplasty sequentially of the posterior tibial artery to normal artery distally.  I did not feel an atherectomy was appropriate here given that we had initially been in a subintimal plane.  A final right lower extremity arteriogram showed patent filling of the peroneal and posterior tibial artery to empty much quicker to the foot.  There was filling through collaterals of the dorsalis pedis in the foot as well as the posterior tibial with a nice plantar arch.  This point time exchanged for short 6 French sheath in the left groin.  A mync closure device was deployed left groin.    Contrast: 165 mL   Marty Heck, MD [facility] Office: [phone] Pager: [place]

## 2018-04-05 NOTE — H&P (Signed)
History and Physical Interval Note:  04/05/2018 9:53 AM  Sonya [name]  has presented today for surgery, with the diagnosis of poor flow in leg - pvd  The various methods of treatment have been discussed with the patient and family. After consideration of risks, benefits and other options for treatment, the patient has consented to  Procedure(s): ABDOMINAL AORTOGRAM W/LOWER EXTREMITY (N/A) as a surgical intervention .  The patient's history has been reviewed, patient examined, no change in status, stable for surgery.  I have reviewed the patient's chart and labs.  Questions were answered to the patient's satisfaction.    Aortogram, BLE arteriogram, possible intervention.  Marty Heck  Patient name: Sonya [name]         MRN: [id number]        DOB: [birth year]        Sex: female  REASON FOR CONSULT: Rest pain bilateral lower extremities  HPI: Sonya [name] is a 67 y.o. female with end-stage renal disease on hemodialysis, hypertension, hyperlipidemia, and previous CVA that presents for evaluation of rest pain in her bilateral lower extremities.  Patient states that she has had pain in her bilateral calfs and feet for the last several months that has been worsening.  She states it keeps her up all night long she is been having significant difficulty sleeping.  Pain is all the time. Both feet are equally painful.  She states she has to get up out of bed and dangle her legs.  She denies any previous lower extremity interventions and denies a previous arteriogram for diagnostic purposes.  She currently walks with a walker following her stroke but remains functional according to her family.  She denies tobacco abuse.  She denies being a diabetic.  No tissue loss at this time.    She had noninvasive imaging that was done on 03/15/2018 that shows monophasic arterial waveforms in her right lower extremity with a toe brachial index of 0.18.  She had biphasic dorsalis pedis waveform in the left with  a occluded posterior tibial and a toe brachial index of 0.26.      Past Medical History:  Diagnosis Date  . Anemia   . Anxiety   . CKD (chronic kidney disease)   . Diverticulitis   . Dysphagia   . ESRD (end stage renal disease) ([place])    On HD  . Fracture of femoral neck, left, closed ([place]) 05/30/2015  . Gastritis 06/2013 & 05/2015  . Gastroesophageal reflux disease with stricture 05/2015  . GERD (gastroesophageal reflux disease)   . HTN (hypertension)   . Hyperparathyroidism due to renal insufficiency ([place])   . Hypertensive urgency 06/2013 & 04/2015  . Hypothyroidism   . ICH (intracerebral hemorrhage) ([place]) 10/2014  . Meningocele ([place])   . Protein calorie malnutrition ([place])   . Stroke ([place])   . Uterine cancer ([place]) 2012  . Vertical diplopia   . Vertigo     Past Surgical History:  Procedure Laterality Date  . ABDOMINAL HYSTERECTOMY    . APPENDECTOMY    . BASCILIC VEIN TRANSPOSITION Right 09/17/2013   Procedure: BRACHIAL VEIN TRANSPOSITION;  Surgeon: Conrad [place], MD;  Location: [facility];  Service: Vascular;  Laterality: Right;  . BASCILIC VEIN TRANSPOSITION Right 10/29/2013   Procedure: RIGHT SECOND STAGE BRACHIAL VEIN TRANSPOSITION;  Surgeon: Conrad [place], MD;  Location: [facility];  Service: Vascular;  Laterality: Right;  . CESAREAN SECTION    . CHOLECYSTECTOMY    . COLONOSCOPY  2000  TICS, IH  . COLONOSCOPY  2003 NUR BRBPR D50 V6   DC/[place] TICS, IH  . COLONOSCOPY N/A 09/04/2013   SLF: Small ulcer in the descending colon, one colon polyp (tubular adenoma), large internal hemorrhoids, moderate diverticulosis. next tcs 10 years.  . ESOPHAGOGASTRODUODENOSCOPY N/A 05/23/2015   OZD:GUYQ non-erosive gastritis/stricture at the gastroesophageal junction  . ESOPHAGOGASTRODUODENOSCOPY (EGD) WITH ESOPHAGEAL DILATION N/A 06/08/2013   Dr. Fields:Stricture at the gastroesophageal junction/MILD NON-erosive gastritis (inflammation) was found in the gastric antrum;  multiple biopsies/NO SOURCE FOR ANEMIA IDETIFIED-MOST LIKELY DUE TO ANEMIA OF CHRONIC DISEASE  . FLEXIBLE SIGMOIDOSCOPY N/A 01/02/2016   Dr. Oneida Alar: external hemorrhoids, diverticulosis, internal hemorrhoids, proximal rectum normal  . GIVENS CAPSULE STUDY N/A 12/03/2013   SLF: occasional gastric erosion. small bowel normal  . HIP PINNING,CANNULATED Left 05/31/2015   Procedure: CANNULATED HIP PINNING;  Surgeon: Leandrew Koyanagi, MD;  Location: [facility];  Service: Orthopedics;  Laterality: Left;  . INSERTION OF DIALYSIS CATHETER Left 09/17/2013   Procedure: INSERTION OF DIALYSIS CATHETER;  Surgeon: Conrad Newberry, MD;  Location: [facility];  Service: Vascular;  Laterality: Left;  . LAPAROSCOPIC TOTAL HYSTERECTOMY    . NASAL SEPTUM SURGERY    . SAVORY DILATION N/A 05/23/2015   Procedure: SAVORY DILATION;  Surgeon: Danie Binder, MD;  [facility];  Service: Endoscopy;  Laterality: N/A;  . THROMBECTOMY AND REVISION OF ARTERIOVENTOUS (AV) GORETEX  GRAFT Right 07/06/2017   Procedure: THROMBECTOMY AND REVISION OF ARTERIOVENOUS (AV) GORETEX  GRAFT RIGHT UPPER ARM;  Surgeon: Rosetta Posner, MD;  [facility];  Service: Vascular;  Laterality: Right;         Family History  Problem Relation Age of Onset  . Cancer Mother   . Diabetes Mother   . Hypertension Mother   . Diabetes Father   . Hypertension Father   . Hypertension Sister   . Colon cancer Neg Hx     SOCIAL HISTORY: Social History        Socioeconomic History  . Marital status: Widowed    Spouse name: Not on file  . Number of children: Not on file  . Years of education: Not on file  . Highest education level: Not on file  Occupational History  . Not on file  Social Needs  . Financial resource strain: Not on file  . Food insecurity:    Worry: Not on file    Inability: Not on file  . Transportation needs:    Medical: Not on file    Non-medical: Not on file  Tobacco Use  . Smoking status: Never  Smoker  . Smokeless tobacco: Never Used  Substance and Sexual Activity  . Alcohol use: No  . Drug use: No  . Sexual activity: Not on file  Lifestyle  . Physical activity:    Days per week: Not on file    Minutes per session: Not on file  . Stress: Not on file  Relationships  . Social connections:    Talks on phone: Not on file    Gets together: Not on file    Attends religious service: Not on file    Active member of club or organization: Not on file    Attends meetings of clubs or organizations: Not on file    Relationship status: Not on file  . Intimate partner violence:    Fear of current or ex partner: Not on file    Emotionally abused: Not on file    Physically abused:  Not on file    Forced sexual activity: Not on file  Other Topics Concern  . Not on file  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        Allergies  Allergen Reactions  . Cephalexin Itching          Current Outpatient Medications  Medication Sig Dispense Refill  . ALLOPURINOL PO Take by mouth.    . ALPRAZolam (XANAX) 0.5 MG tablet Take 1 tablet (0.5 mg total) by mouth 2 (two) times daily as needed for anxiety. 10 tablet 0  . aspirin EC 81 MG EC tablet Take 1 tablet (81 mg total) by mouth daily. 30 tablet 1  . atorvastatin (LIPITOR) 40 MG tablet Take 1 tablet (40 mg total) by mouth daily at 6 PM. 30 tablet 1  . Carboxymethylcellulose Sodium (THERATEARS) 0.25 % SOLN Apply 1-2 drops to eye daily as needed (for eye irritation).    . folic acid (FOLVITE) 1 MG tablet Take 2 mg by mouth daily.    Marland Kitchen gabapentin (NEURONTIN) 100 MG capsule Take 100 mg by mouth at bedtime.    Marland Kitchen levothyroxine (SYNTHROID, LEVOTHROID) 50 MCG tablet Take 1 tablet (50 mcg total) by mouth daily. (Patient taking differently: Take 50 mcg by mouth daily before breakfast. ) 30 tablet 0  . lidocaine-prilocaine (EMLA) cream Apply 1 application topically every Tuesday,  Thursday, and Saturday at 6 PM.     . NIFEdipine (ADALAT CC) 90 MG 24 hr tablet 90 mg daily.  3  . NIFEdipine (PROCARDIA-XL/ADALAT CC) 30 MG 24 hr tablet Take 1 tablet (30 mg total) by mouth at bedtime. 30 tablet 0  . pentoxifylline (TRENTAL) 400 MG CR tablet Take 400 mg by mouth 3 (three) times daily with meals.  11  . Vitamin D, Ergocalciferol, (DRISDOL) 50000 units CAPS capsule Take 50,000 Units by mouth every 7 (seven) days.     No current facility-administered medications for this visit.     REVIEW OF SYSTEMS:  [X]  denotes positive finding, [ ]  denotes negative finding Cardiac  Comments:  Chest pain or chest pressure:    Shortness of breath upon exertion:    Short of breath when lying flat:    Irregular heart rhythm:        Vascular    Pain in calf, thigh, or hip brought on by ambulation: x   Pain in feet at night that wakes you up from your sleep:  x   Blood clot in your veins:    Leg swelling:         Pulmonary    Oxygen at home:    Productive cough:     Wheezing:         Neurologic    Sudden weakness in arms or legs:     Sudden numbness in arms or legs:     Sudden onset of difficulty speaking or slurred speech:    Temporary loss of vision in one eye:     Problems with dizziness:         Gastrointestinal    Blood in stool:     Vomited blood:         Genitourinary    Burning when urinating:     Blood in urine:        Psychiatric    Major depression:         Hematologic    Bleeding problems:    Problems with blood clotting too easily:  Skin    Rashes or ulcers:        Constitutional    Fever or chills:      PHYSICAL EXAM:    Vitals:   03/28/18 1227  BP: 115/62  Pulse: 79  Resp: 18  Temp: 98.5 F (36.9 C)  TempSrc: Oral  SpO2: 100%  Weight: 66.7 kg  Height: 5\' 5"  (1.651 m)    GENERAL: The patient is a well-nourished female, in no  acute distress. The vital signs are documented above. CARDIAC: There is a regular rate and rhythm.  VASCULAR:  2+ palpable radial pulse to the bilateral upper extremities. 1+ palpable femoral pulses to the bilateral groins. No palpable popliteal pulses bilaterally. Monophasic right dorsalis pedis and posterior tibial signal.  No tissue loss in the right foot. Biphasic left dorsalis pedis signal and no posterior tibial signal.  No tissue loss in the left foot. PULMONARY: There is good air exchange bilaterally without wheezing or rales. ABDOMEN: Soft and non-tender with normal pitched bowel sounds.  MUSCULOSKELETAL: There are no major deformities or cyanosis. NEUROLOGIC: No focal weakness or paresthesias are detected. SKIN: There are no ulcers or rashes noted. PSYCHIATRIC: The patient has a normal affect.  DATA:   In review of her noninvasive imaging from [facility] she has a right ABI of 1.27 with a toe brachial index of 0.18 and monophasic waveforms at the ankle.  On the left they report an ABI of 0.78 with a toe brachial index of 0.26 and biphasic dorsalis pedis waveforms and an occluded posterior tibial.  Suspect that her ABIs were noncompressible and are inaccurate.  Assessment/Plan:  In talking with Ms. Nienow she describes severe rest pain in her bilateral lower extremities.  I suspect her ABIs were noncompressible at the outside hospital and she has severely reduced toe brachial index in both feet.  We have recommended aortogram with bilateral lower extremity arteriogram to further evaluate.  We discussed accessing her left groin and intervening on her right leg first given she has more blunted waveforms distally on the right with a more severely reduced toe brachial index.  Discussed that if she has an endovascular option on the left we will have to bring her back another time to access the other groin and work on the left leg.  The patient is agreeable to move forward.  We will  post for the next available Wednesday given she dialyzes Tuesday Thursday Saturday.  She is already on aspirin and Lipitor and does not smoke.    Marty Heck, MD [facility] Office: [phone] Pager: [place]

## 2018-04-06 ENCOUNTER — Telehealth: Payer: Self-pay | Admitting: Vascular Surgery

## 2018-04-06 ENCOUNTER — Encounter (HOSPITAL_COMMUNITY): Payer: Self-pay | Admitting: Vascular Surgery

## 2018-04-06 DIAGNOSIS — D631 Anemia in chronic kidney disease: Secondary | ICD-10-CM | POA: Diagnosis not present

## 2018-04-06 DIAGNOSIS — Z23 Encounter for immunization: Secondary | ICD-10-CM | POA: Diagnosis not present

## 2018-04-06 DIAGNOSIS — D509 Iron deficiency anemia, unspecified: Secondary | ICD-10-CM | POA: Diagnosis not present

## 2018-04-06 DIAGNOSIS — Z992 Dependence on renal dialysis: Secondary | ICD-10-CM | POA: Diagnosis not present

## 2018-04-06 DIAGNOSIS — N186 End stage renal disease: Secondary | ICD-10-CM | POA: Diagnosis not present

## 2018-04-06 NOTE — Telephone Encounter (Signed)
sch appt spk to pt 05/02/18 3pm ABI 315pm f/u MD

## 2018-04-07 ENCOUNTER — Encounter (HOSPITAL_COMMUNITY): Payer: Self-pay | Admitting: Vascular Surgery

## 2018-04-07 ENCOUNTER — Other Ambulatory Visit: Payer: Self-pay

## 2018-04-07 DIAGNOSIS — I739 Peripheral vascular disease, unspecified: Secondary | ICD-10-CM

## 2018-04-08 DIAGNOSIS — N186 End stage renal disease: Secondary | ICD-10-CM | POA: Diagnosis not present

## 2018-04-08 DIAGNOSIS — Z992 Dependence on renal dialysis: Secondary | ICD-10-CM | POA: Diagnosis not present

## 2018-04-08 DIAGNOSIS — D631 Anemia in chronic kidney disease: Secondary | ICD-10-CM | POA: Diagnosis not present

## 2018-04-08 DIAGNOSIS — Z23 Encounter for immunization: Secondary | ICD-10-CM | POA: Diagnosis not present

## 2018-04-08 DIAGNOSIS — D509 Iron deficiency anemia, unspecified: Secondary | ICD-10-CM | POA: Diagnosis not present

## 2018-04-11 DIAGNOSIS — N186 End stage renal disease: Secondary | ICD-10-CM | POA: Diagnosis not present

## 2018-04-11 DIAGNOSIS — Z23 Encounter for immunization: Secondary | ICD-10-CM | POA: Diagnosis not present

## 2018-04-11 DIAGNOSIS — D509 Iron deficiency anemia, unspecified: Secondary | ICD-10-CM | POA: Diagnosis not present

## 2018-04-11 DIAGNOSIS — Z992 Dependence on renal dialysis: Secondary | ICD-10-CM | POA: Diagnosis not present

## 2018-04-11 DIAGNOSIS — D631 Anemia in chronic kidney disease: Secondary | ICD-10-CM | POA: Diagnosis not present

## 2018-04-13 DIAGNOSIS — Z992 Dependence on renal dialysis: Secondary | ICD-10-CM | POA: Diagnosis not present

## 2018-04-13 DIAGNOSIS — Z23 Encounter for immunization: Secondary | ICD-10-CM | POA: Diagnosis not present

## 2018-04-13 DIAGNOSIS — D631 Anemia in chronic kidney disease: Secondary | ICD-10-CM | POA: Diagnosis not present

## 2018-04-13 DIAGNOSIS — D509 Iron deficiency anemia, unspecified: Secondary | ICD-10-CM | POA: Diagnosis not present

## 2018-04-13 DIAGNOSIS — N186 End stage renal disease: Secondary | ICD-10-CM | POA: Diagnosis not present

## 2018-04-15 DIAGNOSIS — D509 Iron deficiency anemia, unspecified: Secondary | ICD-10-CM | POA: Diagnosis not present

## 2018-04-15 DIAGNOSIS — Z992 Dependence on renal dialysis: Secondary | ICD-10-CM | POA: Diagnosis not present

## 2018-04-15 DIAGNOSIS — N186 End stage renal disease: Secondary | ICD-10-CM | POA: Diagnosis not present

## 2018-04-15 DIAGNOSIS — D631 Anemia in chronic kidney disease: Secondary | ICD-10-CM | POA: Diagnosis not present

## 2018-04-15 DIAGNOSIS — Z23 Encounter for immunization: Secondary | ICD-10-CM | POA: Diagnosis not present

## 2018-04-17 ENCOUNTER — Encounter: Payer: Medicare Other | Admitting: Vascular Surgery

## 2018-04-18 DIAGNOSIS — N186 End stage renal disease: Secondary | ICD-10-CM | POA: Diagnosis not present

## 2018-04-18 DIAGNOSIS — Z23 Encounter for immunization: Secondary | ICD-10-CM | POA: Diagnosis not present

## 2018-04-18 DIAGNOSIS — Z992 Dependence on renal dialysis: Secondary | ICD-10-CM | POA: Diagnosis not present

## 2018-04-18 DIAGNOSIS — D509 Iron deficiency anemia, unspecified: Secondary | ICD-10-CM | POA: Diagnosis not present

## 2018-04-18 DIAGNOSIS — D631 Anemia in chronic kidney disease: Secondary | ICD-10-CM | POA: Diagnosis not present

## 2018-04-20 DIAGNOSIS — Z23 Encounter for immunization: Secondary | ICD-10-CM | POA: Diagnosis not present

## 2018-04-20 DIAGNOSIS — D631 Anemia in chronic kidney disease: Secondary | ICD-10-CM | POA: Diagnosis not present

## 2018-04-20 DIAGNOSIS — N186 End stage renal disease: Secondary | ICD-10-CM | POA: Diagnosis not present

## 2018-04-20 DIAGNOSIS — D509 Iron deficiency anemia, unspecified: Secondary | ICD-10-CM | POA: Diagnosis not present

## 2018-04-20 DIAGNOSIS — Z992 Dependence on renal dialysis: Secondary | ICD-10-CM | POA: Diagnosis not present

## 2018-04-22 DIAGNOSIS — D631 Anemia in chronic kidney disease: Secondary | ICD-10-CM | POA: Diagnosis not present

## 2018-04-22 DIAGNOSIS — N186 End stage renal disease: Secondary | ICD-10-CM | POA: Diagnosis not present

## 2018-04-22 DIAGNOSIS — D509 Iron deficiency anemia, unspecified: Secondary | ICD-10-CM | POA: Diagnosis not present

## 2018-04-22 DIAGNOSIS — Z23 Encounter for immunization: Secondary | ICD-10-CM | POA: Diagnosis not present

## 2018-04-22 DIAGNOSIS — Z992 Dependence on renal dialysis: Secondary | ICD-10-CM | POA: Diagnosis not present

## 2018-04-23 DIAGNOSIS — Z23 Encounter for immunization: Secondary | ICD-10-CM | POA: Diagnosis not present

## 2018-04-23 DIAGNOSIS — D631 Anemia in chronic kidney disease: Secondary | ICD-10-CM | POA: Diagnosis not present

## 2018-04-23 DIAGNOSIS — D509 Iron deficiency anemia, unspecified: Secondary | ICD-10-CM | POA: Diagnosis not present

## 2018-04-23 DIAGNOSIS — Z992 Dependence on renal dialysis: Secondary | ICD-10-CM | POA: Diagnosis not present

## 2018-04-23 DIAGNOSIS — N186 End stage renal disease: Secondary | ICD-10-CM | POA: Diagnosis not present

## 2018-04-25 DIAGNOSIS — D509 Iron deficiency anemia, unspecified: Secondary | ICD-10-CM | POA: Diagnosis not present

## 2018-04-25 DIAGNOSIS — Z992 Dependence on renal dialysis: Secondary | ICD-10-CM | POA: Diagnosis not present

## 2018-04-25 DIAGNOSIS — Z23 Encounter for immunization: Secondary | ICD-10-CM | POA: Diagnosis not present

## 2018-04-25 DIAGNOSIS — D631 Anemia in chronic kidney disease: Secondary | ICD-10-CM | POA: Diagnosis not present

## 2018-04-25 DIAGNOSIS — N186 End stage renal disease: Secondary | ICD-10-CM | POA: Diagnosis not present

## 2018-04-27 DIAGNOSIS — Z23 Encounter for immunization: Secondary | ICD-10-CM | POA: Diagnosis not present

## 2018-04-27 DIAGNOSIS — N186 End stage renal disease: Secondary | ICD-10-CM | POA: Diagnosis not present

## 2018-04-27 DIAGNOSIS — D631 Anemia in chronic kidney disease: Secondary | ICD-10-CM | POA: Diagnosis not present

## 2018-04-27 DIAGNOSIS — D509 Iron deficiency anemia, unspecified: Secondary | ICD-10-CM | POA: Diagnosis not present

## 2018-04-27 DIAGNOSIS — Z992 Dependence on renal dialysis: Secondary | ICD-10-CM | POA: Diagnosis not present

## 2018-04-29 DIAGNOSIS — D509 Iron deficiency anemia, unspecified: Secondary | ICD-10-CM | POA: Diagnosis not present

## 2018-04-29 DIAGNOSIS — Z23 Encounter for immunization: Secondary | ICD-10-CM | POA: Diagnosis not present

## 2018-04-29 DIAGNOSIS — Z992 Dependence on renal dialysis: Secondary | ICD-10-CM | POA: Diagnosis not present

## 2018-04-29 DIAGNOSIS — D631 Anemia in chronic kidney disease: Secondary | ICD-10-CM | POA: Diagnosis not present

## 2018-04-29 DIAGNOSIS — N186 End stage renal disease: Secondary | ICD-10-CM | POA: Diagnosis not present

## 2018-05-01 DIAGNOSIS — Z992 Dependence on renal dialysis: Secondary | ICD-10-CM | POA: Diagnosis not present

## 2018-05-01 DIAGNOSIS — N186 End stage renal disease: Secondary | ICD-10-CM | POA: Diagnosis not present

## 2018-05-02 ENCOUNTER — Ambulatory Visit (INDEPENDENT_AMBULATORY_CARE_PROVIDER_SITE_OTHER): Payer: Medicare Other | Admitting: Vascular Surgery

## 2018-05-02 ENCOUNTER — Encounter: Payer: Self-pay | Admitting: Vascular Surgery

## 2018-05-02 ENCOUNTER — Other Ambulatory Visit: Payer: Self-pay

## 2018-05-02 ENCOUNTER — Ambulatory Visit (HOSPITAL_COMMUNITY)
Admission: RE | Admit: 2018-05-02 | Discharge: 2018-05-02 | Disposition: A | Discharge status: Home / Self Care | Payer: Medicare Other | Source: Ambulatory Visit | Attending: Vascular Surgery | Admitting: Vascular Surgery

## 2018-05-02 VITALS — BP 155/83 | HR 84 | Resp 18 | Ht 65.0 in | Wt 135.0 lb

## 2018-05-02 DIAGNOSIS — N2581 Secondary hyperparathyroidism of renal origin: Secondary | ICD-10-CM | POA: Diagnosis not present

## 2018-05-02 DIAGNOSIS — I10 Essential (primary) hypertension: Secondary | ICD-10-CM | POA: Insufficient documentation

## 2018-05-02 DIAGNOSIS — Z9862 Peripheral vascular angioplasty status: Secondary | ICD-10-CM | POA: Diagnosis not present

## 2018-05-02 DIAGNOSIS — D631 Anemia in chronic kidney disease: Secondary | ICD-10-CM | POA: Diagnosis not present

## 2018-05-02 DIAGNOSIS — I739 Peripheral vascular disease, unspecified: Secondary | ICD-10-CM

## 2018-05-02 DIAGNOSIS — N186 End stage renal disease: Secondary | ICD-10-CM | POA: Diagnosis not present

## 2018-05-02 DIAGNOSIS — Z992 Dependence on renal dialysis: Secondary | ICD-10-CM | POA: Diagnosis not present

## 2018-05-02 NOTE — Progress Notes (Signed)
Patient name: Sonya [name] MRN: [id number] DOB: 02/15/1951 Sex: female  REASON FOR VISIT: s/p right leg endovascular intervention HPI: Sonya [name] is a 67 y.o. female who presents for interval follow-up after bilateral lower extremity arteriogram and right leg intervention.  Ultimately she underwent a right peroneal atherectomy with a posterior tibial subintimal angioplasty in her right leg.  We also evaluate the runoff in her left leg and she has single-vessel runoff via her anterior tibial artery.  Since intervention she states that her legs feel much better.  She particularly denies any pain in her left leg.  States the right leg is improved.  She is taking the aspirin and Plavix. Her only complaint today is some mild aching in both feet.  Past Medical History:  Diagnosis Date  . Anemia   . Anxiety   . CKD (chronic kidney disease)   . Diverticulitis   . Dysphagia   . ESRD (end stage renal disease) ([place])    On HD  . Fracture of femoral neck, left, closed ([place]) 05/30/2015  . Gastritis 06/2013 & 05/2015  . Gastroesophageal reflux disease with stricture 05/2015  . GERD (gastroesophageal reflux disease)   . HTN (hypertension)   . Hyperparathyroidism due to renal insufficiency ([place])   . Hypertensive urgency 06/2013 & 04/2015  . Hypothyroidism   . ICH (intracerebral hemorrhage) ([place]) 10/2014  . 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 Protein calorie malnutrition ([place])   . Stroke ([place])   . Uterine cancer ([place]) 2012  . Vertical diplopia   . Vertigo     Past Surgical History:  Procedure Laterality Date  . ABDOMINAL AORTOGRAM N/A 04/05/2018   Procedure: ABDOMINAL AORTOGRAM;  Surgeon: Marty Heck, MD;  Location: [facility];  Service: Cardiovascular;  Laterality: N/A;  . ABDOMINAL HYSTERECTOMY    . APPENDECTOMY    . BASCILIC VEIN TRANSPOSITION Right 09/17/2013   Procedure: BRACHIAL VEIN TRANSPOSITION;  Surgeon: Conrad Cadott, MD;  Location: [facility];  Service: Vascular;  Laterality:  Right;  . BASCILIC VEIN TRANSPOSITION Right 10/29/2013   Procedure: RIGHT SECOND STAGE BRACHIAL VEIN TRANSPOSITION;  Surgeon: Conrad Echo, MD;  Location: [facility];  Service: Vascular;  Laterality: Right;  . CESAREAN SECTION    . CHOLECYSTECTOMY    . COLONOSCOPY  2000   TICS, IH  . COLONOSCOPY  2003 NUR BRBPR D50 V6   DC/[place] TICS, IH  . COLONOSCOPY N/A 09/04/2013   SLF: Small ulcer in the descending colon, one colon polyp (tubular adenoma), large internal hemorrhoids, moderate diverticulosis. next tcs 10 years.  . ESOPHAGOGASTRODUODENOSCOPY N/A 05/23/2015   WUJ:WJXB non-erosive gastritis/stricture at the gastroesophageal junction  . ESOPHAGOGASTRODUODENOSCOPY (EGD) WITH ESOPHAGEAL DILATION N/A 06/08/2013   Dr. Fields:Stricture at the gastroesophageal junction/MILD NON-erosive gastritis (inflammation) was found in the gastric antrum; multiple biopsies/NO SOURCE FOR ANEMIA IDETIFIED-MOST LIKELY DUE TO ANEMIA OF CHRONIC DISEASE  . FLEXIBLE SIGMOIDOSCOPY N/A 01/02/2016   Dr. Oneida Alar: external hemorrhoids, diverticulosis, internal hemorrhoids, proximal rectum normal  . GIVENS CAPSULE STUDY N/A 12/03/2013   SLF: occasional gastric erosion. small bowel normal  . HIP PINNING,CANNULATED Left 05/31/2015   Procedure: CANNULATED HIP PINNING;  Surgeon: Leandrew Koyanagi, MD;  Location: [facility];  Service: Orthopedics;  Laterality: Left;  . INSERTION OF DIALYSIS CATHETER Left 09/17/2013   Procedure: INSERTION OF DIALYSIS CATHETER;  Surgeon: Conrad [place], MD;  Location: [facility];  Service: Vascular;  Laterality: Left;  . LAPAROSCOPIC TOTAL HYSTERECTOMY    . LOWER EXTREMITY ANGIOGRAPHY Bilateral  04/05/2018   Procedure: Lower Extremity Angiography;  Surgeon: Marty Heck, MD;  Location: [facility];  Service: Cardiovascular;  Laterality: Bilateral;  . NASAL SEPTUM SURGERY    . PERIPHERAL VASCULAR ATHERECTOMY Right 04/05/2018   Procedure: PERIPHERAL VASCULAR ATHERECTOMY;  Surgeon: Marty Heck, MD;  Location: [facility];  Service: Cardiovascular;  Laterality: Right;  Peroneal   . PERIPHERAL VASCULAR BALLOON ANGIOPLASTY Right 04/05/2018   Procedure: PERIPHERAL VASCULAR BALLOON ANGIOPLASTY;  Surgeon: Marty Heck, MD;  Location: [facility];  Service: Cardiovascular;  Laterality: Right;  Posterior tibial  . SAVORY DILATION N/A 05/23/2015   Procedure: SAVORY DILATION;  Surgeon: Danie Binder, MD;  [facility];  Service: Endoscopy;  Laterality: N/A;  . THROMBECTOMY AND REVISION OF ARTERIOVENTOUS (AV) GORETEX  GRAFT Right 07/06/2017   Procedure: THROMBECTOMY AND REVISION OF ARTERIOVENOUS (AV) GORETEX  GRAFT RIGHT UPPER ARM;  Surgeon: Rosetta Posner, MD;  [facility];  Service: Vascular;  Laterality: Right;    Family History  Problem Relation Age of Onset  . Cancer Mother   . Diabetes Mother   . Hypertension Mother   . Diabetes Father   . Hypertension Father   . Hypertension Sister   . Colon cancer Neg Hx     SOCIAL HISTORY: Social History   Tobacco Use  . Smoking status: Never Smoker  . Smokeless tobacco: Never Used  Substance Use Topics  . Alcohol use: No    Allergies  Allergen Reactions  . Cephalexin Itching    Current Outpatient Medications  Medication Sig Dispense Refill  . allopurinol (ZYLOPRIM) 100 MG tablet Take 100 mg by mouth every Wednesday.     . ALPRAZolam (XANAX) 0.5 MG tablet Take 1 tablet (0.5 mg total) by mouth 2 (two) times daily as needed for anxiety. (Patient taking differently: Take 0.5 mg by mouth 2 (two) times daily. ) 10 tablet 0  . aspirin EC 81 MG EC tablet Take 1 tablet (81 mg total) by mouth daily. 30 tablet 1  . atorvastatin (LIPITOR) 10 MG tablet Take 10 mg by mouth daily.    . clopidogrel (PLAVIX) 75 MG tablet Take 1 tablet (75 mg total) by mouth daily. 30 tablet 11  . diphenhydrAMINE (BENADRYL) 25 mg capsule Take 25 mg by mouth 2 (two) times daily.    . folic acid (FOLVITE) 1 MG tablet Take 1 mg by mouth 2 (two) times  daily.     Marland Kitchen gabapentin (NEURONTIN) 100 MG capsule Take 100 mg by mouth at bedtime.    Marland Kitchen levothyroxine (SYNTHROID, LEVOTHROID) 50 MCG tablet Take 1 tablet (50 mcg total) by mouth daily. (Patient taking differently: Take 50 mcg by mouth daily before breakfast. ) 30 tablet 0  . lidocaine-prilocaine (EMLA) cream Apply 1 application topically every Tuesday, Thursday, and Saturday at 6 PM.     . NIFEdipine (ADALAT CC) 90 MG 24 hr tablet Take 90 mg by mouth daily.   3  . pentoxifylline (TRENTAL) 400 MG CR tablet Take 400 mg by mouth 3 (three) times daily with meals.  11  . Vitamin D, Ergocalciferol, (DRISDOL) 50000 units CAPS capsule Take 50,000 Units by mouth every Monday.      No current facility-administered medications for this visit.     REVIEW OF SYSTEMS:  [X]  denotes positive finding, [ ]  denotes negative finding Cardiac  Comments:  Chest pain or chest pressure:    Shortness of breath upon exertion:  Short of breath when lying flat:    Irregular heart rhythm:        Vascular    Pain in calf, thigh, or hip brought on by ambulation:    Pain in feet at night that wakes you up from your sleep:   Denies  Blood clot in your veins:    Leg swelling:         Pulmonary    Oxygen at home:    Productive cough:     Wheezing:         Neurologic    Sudden weakness in arms or legs:     Sudden numbness in arms or legs:     Sudden onset of difficulty speaking or slurred speech:    Temporary loss of vision in one eye:     Problems with dizziness:         Gastrointestinal    Blood in stool:     Vomited blood:         Genitourinary    Burning when urinating:     Blood in urine:        Psychiatric    Major depression:         Hematologic    Bleeding problems:    Problems with blood clotting too easily:        Skin    Rashes or ulcers:        Constitutional    Fever or chills:      PHYSICAL EXAM: Vitals:   05/02/18 1540  BP: (!) 155/83  Pulse: 84  Resp: 18  SpO2: 100%    Weight: 61.2 kg  Height: 5\' 5"  (1.651 m)    GENERAL: The patient is a well-nourished female, in no acute distress. The vital signs are documented above. CARDIAC: There is a regular rate and rhythm.  VASCULAR:  Left groin with no hematoma  Palpable femoral pulses in both groins Triphasic signals at R DP/PT Biphasic signal L DP No tissue loss PULMONARY: There is good air exchange bilaterally without wheezing or rales. MUSCULOSKELETAL: There are no major deformities or cyanosis. NEUROLOGIC: No focal weakness or paresthesias are detected. SKIN: There are no ulcers or rashes noted.   DATA:   Noninvasive imaging today was independently reviewed by me and shows triphasic waveforms at the right ankle with a toe pressure of 127 and a TBI of 0.85.  She has a toe pressure of 127 in the left foot as well with a TBI of 0.85 and biphasic waveform.    Assessment/Plan:  I reviewed her noninvasive imaging and she has significant improvement in her right lower extremity perfusion after intervention.  Previously she had monophasic waveforms at the ankle with a toe brachial index of 0.18.  She now has triphasic waveforms in the right ankle with a toe brachial index of 0.85.  She states the pain in both legs has resolved (even though we did not intervene on the left leg).  Her only complaint this time is some aching in both of her feet which does not appear consistent with rest pain.  Moreover with toe pressures of 127 bilaterally.  I will see her back in 6 months with repeat ABIs.  Should her left leg deteriorate she would need an intervention on her anterior tibial artery given single vessel tibial run-off.     Marty Heck, MD [facility] Office: [phone] Pager: [place]

## 2018-05-04 DIAGNOSIS — N186 End stage renal disease: Secondary | ICD-10-CM | POA: Diagnosis not present

## 2018-05-04 DIAGNOSIS — N2581 Secondary hyperparathyroidism of renal origin: Secondary | ICD-10-CM | POA: Diagnosis not present

## 2018-05-04 DIAGNOSIS — Z992 Dependence on renal dialysis: Secondary | ICD-10-CM | POA: Diagnosis not present

## 2018-05-04 DIAGNOSIS — D631 Anemia in chronic kidney disease: Secondary | ICD-10-CM | POA: Diagnosis not present

## 2018-05-06 DIAGNOSIS — N186 End stage renal disease: Secondary | ICD-10-CM | POA: Diagnosis not present

## 2018-05-06 DIAGNOSIS — N2581 Secondary hyperparathyroidism of renal origin: Secondary | ICD-10-CM | POA: Diagnosis not present

## 2018-05-06 DIAGNOSIS — D631 Anemia in chronic kidney disease: Secondary | ICD-10-CM | POA: Diagnosis not present

## 2018-05-06 DIAGNOSIS — Z992 Dependence on renal dialysis: Secondary | ICD-10-CM | POA: Diagnosis not present

## 2018-05-09 DIAGNOSIS — N186 End stage renal disease: Secondary | ICD-10-CM | POA: Diagnosis not present

## 2018-05-09 DIAGNOSIS — D631 Anemia in chronic kidney disease: Secondary | ICD-10-CM | POA: Diagnosis not present

## 2018-05-09 DIAGNOSIS — N2581 Secondary hyperparathyroidism of renal origin: Secondary | ICD-10-CM | POA: Diagnosis not present

## 2018-05-09 DIAGNOSIS — Z992 Dependence on renal dialysis: Secondary | ICD-10-CM | POA: Diagnosis not present

## 2018-05-11 DIAGNOSIS — N2581 Secondary hyperparathyroidism of renal origin: Secondary | ICD-10-CM | POA: Diagnosis not present

## 2018-05-11 DIAGNOSIS — D631 Anemia in chronic kidney disease: Secondary | ICD-10-CM | POA: Diagnosis not present

## 2018-05-11 DIAGNOSIS — N186 End stage renal disease: Secondary | ICD-10-CM | POA: Diagnosis not present

## 2018-05-11 DIAGNOSIS — Z992 Dependence on renal dialysis: Secondary | ICD-10-CM | POA: Diagnosis not present

## 2018-05-13 DIAGNOSIS — Z992 Dependence on renal dialysis: Secondary | ICD-10-CM | POA: Diagnosis not present

## 2018-05-13 DIAGNOSIS — N186 End stage renal disease: Secondary | ICD-10-CM | POA: Diagnosis not present

## 2018-05-13 DIAGNOSIS — N2581 Secondary hyperparathyroidism of renal origin: Secondary | ICD-10-CM | POA: Diagnosis not present

## 2018-05-13 DIAGNOSIS — D631 Anemia in chronic kidney disease: Secondary | ICD-10-CM | POA: Diagnosis not present

## 2018-05-16 ENCOUNTER — Inpatient Hospital Stay (HOSPITAL_COMMUNITY)
Admission: EM | Admit: 2018-05-16 | Discharge: 2018-05-24 | DRG: 480 | Disposition: A | Discharge status: Skilled Nursing Facility | Payer: Medicare Other | Attending: Internal Medicine | Admitting: Internal Medicine

## 2018-05-16 ENCOUNTER — Emergency Department (HOSPITAL_COMMUNITY): Payer: Medicare Other

## 2018-05-16 ENCOUNTER — Encounter (HOSPITAL_COMMUNITY): Payer: Self-pay | Admitting: Emergency Medicine

## 2018-05-16 ENCOUNTER — Other Ambulatory Visit: Payer: Self-pay

## 2018-05-16 DIAGNOSIS — E440 Moderate protein-calorie malnutrition: Secondary | ICD-10-CM | POA: Diagnosis present

## 2018-05-16 DIAGNOSIS — S72351A Displaced comminuted fracture of shaft of right femur, initial encounter for closed fracture: Secondary | ICD-10-CM

## 2018-05-16 DIAGNOSIS — E039 Hypothyroidism, unspecified: Secondary | ICD-10-CM | POA: Diagnosis present

## 2018-05-16 DIAGNOSIS — I5030 Unspecified diastolic (congestive) heart failure: Secondary | ICD-10-CM | POA: Diagnosis not present

## 2018-05-16 DIAGNOSIS — S72411A Displaced unspecified condyle fracture of lower end of right femur, initial encounter for closed fracture: Secondary | ICD-10-CM | POA: Diagnosis present

## 2018-05-16 DIAGNOSIS — R4182 Altered mental status, unspecified: Secondary | ICD-10-CM

## 2018-05-16 DIAGNOSIS — Z8542 Personal history of malignant neoplasm of other parts of uterus: Secondary | ICD-10-CM | POA: Diagnosis not present

## 2018-05-16 DIAGNOSIS — Z7401 Bed confinement status: Secondary | ICD-10-CM | POA: Diagnosis not present

## 2018-05-16 DIAGNOSIS — Z992 Dependence on renal dialysis: Secondary | ICD-10-CM

## 2018-05-16 DIAGNOSIS — D631 Anemia in chronic kidney disease: Secondary | ICD-10-CM | POA: Diagnosis present

## 2018-05-16 DIAGNOSIS — Y92002 Bathroom of unspecified non-institutional (private) residence single-family (private) house as the place of occurrence of the external cause: Secondary | ICD-10-CM

## 2018-05-16 DIAGNOSIS — Z8673 Personal history of transient ischemic attack (TIA), and cerebral infarction without residual deficits: Secondary | ICD-10-CM

## 2018-05-16 DIAGNOSIS — N186 End stage renal disease: Secondary | ICD-10-CM

## 2018-05-16 DIAGNOSIS — R52 Pain, unspecified: Secondary | ICD-10-CM | POA: Diagnosis not present

## 2018-05-16 DIAGNOSIS — F0390 Unspecified dementia without behavioral disturbance: Secondary | ICD-10-CM | POA: Diagnosis present

## 2018-05-16 DIAGNOSIS — S72451A Displaced supracondylar fracture without intracondylar extension of lower end of right femur, initial encounter for closed fracture: Principal | ICD-10-CM | POA: Diagnosis present

## 2018-05-16 DIAGNOSIS — S72401A Unspecified fracture of lower end of right femur, initial encounter for closed fracture: Secondary | ICD-10-CM | POA: Diagnosis not present

## 2018-05-16 DIAGNOSIS — I959 Hypotension, unspecified: Secondary | ICD-10-CM | POA: Diagnosis not present

## 2018-05-16 DIAGNOSIS — W1811XA Fall from or off toilet without subsequent striking against object, initial encounter: Secondary | ICD-10-CM | POA: Diagnosis present

## 2018-05-16 DIAGNOSIS — I10 Essential (primary) hypertension: Secondary | ICD-10-CM | POA: Diagnosis present

## 2018-05-16 DIAGNOSIS — R262 Difficulty in walking, not elsewhere classified: Secondary | ICD-10-CM | POA: Diagnosis present

## 2018-05-16 DIAGNOSIS — Z6822 Body mass index (BMI) 22.0-22.9, adult: Secondary | ICD-10-CM

## 2018-05-16 DIAGNOSIS — E785 Hyperlipidemia, unspecified: Secondary | ICD-10-CM | POA: Diagnosis present

## 2018-05-16 DIAGNOSIS — S72351D Displaced comminuted fracture of shaft of right femur, subsequent encounter for closed fracture with routine healing: Secondary | ICD-10-CM | POA: Diagnosis not present

## 2018-05-16 DIAGNOSIS — E782 Mixed hyperlipidemia: Secondary | ICD-10-CM

## 2018-05-16 DIAGNOSIS — Z79899 Other long term (current) drug therapy: Secondary | ICD-10-CM | POA: Diagnosis not present

## 2018-05-16 DIAGNOSIS — I739 Peripheral vascular disease, unspecified: Secondary | ICD-10-CM | POA: Diagnosis not present

## 2018-05-16 DIAGNOSIS — R42 Dizziness and giddiness: Secondary | ICD-10-CM | POA: Diagnosis not present

## 2018-05-16 DIAGNOSIS — E8889 Other specified metabolic disorders: Secondary | ICD-10-CM | POA: Diagnosis present

## 2018-05-16 DIAGNOSIS — D62 Acute posthemorrhagic anemia: Secondary | ICD-10-CM | POA: Diagnosis not present

## 2018-05-16 DIAGNOSIS — M25561 Pain in right knee: Secondary | ICD-10-CM | POA: Diagnosis not present

## 2018-05-16 DIAGNOSIS — S7291XA Unspecified fracture of right femur, initial encounter for closed fracture: Secondary | ICD-10-CM | POA: Diagnosis not present

## 2018-05-16 DIAGNOSIS — K219 Gastro-esophageal reflux disease without esophagitis: Secondary | ICD-10-CM | POA: Diagnosis present

## 2018-05-16 DIAGNOSIS — G9341 Metabolic encephalopathy: Secondary | ICD-10-CM | POA: Diagnosis not present

## 2018-05-16 DIAGNOSIS — I69328 Other speech and language deficits following cerebral infarction: Secondary | ICD-10-CM | POA: Diagnosis not present

## 2018-05-16 DIAGNOSIS — S82831A Other fracture of upper and lower end of right fibula, initial encounter for closed fracture: Secondary | ICD-10-CM | POA: Diagnosis not present

## 2018-05-16 DIAGNOSIS — W19XXXA Unspecified fall, initial encounter: Secondary | ICD-10-CM | POA: Diagnosis not present

## 2018-05-16 DIAGNOSIS — Z419 Encounter for procedure for purposes other than remedying health state, unspecified: Secondary | ICD-10-CM

## 2018-05-16 DIAGNOSIS — Z7982 Long term (current) use of aspirin: Secondary | ICD-10-CM

## 2018-05-16 DIAGNOSIS — I6389 Other cerebral infarction: Secondary | ICD-10-CM | POA: Diagnosis not present

## 2018-05-16 DIAGNOSIS — N2581 Secondary hyperparathyroidism of renal origin: Secondary | ICD-10-CM | POA: Diagnosis present

## 2018-05-16 DIAGNOSIS — M6281 Muscle weakness (generalized): Secondary | ICD-10-CM | POA: Diagnosis present

## 2018-05-16 DIAGNOSIS — S299XXA Unspecified injury of thorax, initial encounter: Secondary | ICD-10-CM | POA: Diagnosis not present

## 2018-05-16 DIAGNOSIS — I63541 Cerebral infarction due to unspecified occlusion or stenosis of right cerebellar artery: Secondary | ICD-10-CM | POA: Diagnosis not present

## 2018-05-16 DIAGNOSIS — Z7902 Long term (current) use of antithrombotics/antiplatelets: Secondary | ICD-10-CM

## 2018-05-16 DIAGNOSIS — I63019 Cerebral infarction due to thrombosis of unspecified vertebral artery: Secondary | ICD-10-CM | POA: Diagnosis present

## 2018-05-16 DIAGNOSIS — I69391 Dysphagia following cerebral infarction: Secondary | ICD-10-CM | POA: Diagnosis not present

## 2018-05-16 DIAGNOSIS — R1310 Dysphagia, unspecified: Secondary | ICD-10-CM | POA: Diagnosis present

## 2018-05-16 DIAGNOSIS — I63233 Cerebral infarction due to unspecified occlusion or stenosis of bilateral carotid arteries: Secondary | ICD-10-CM | POA: Diagnosis not present

## 2018-05-16 DIAGNOSIS — S7290XA Unspecified fracture of unspecified femur, initial encounter for closed fracture: Secondary | ICD-10-CM

## 2018-05-16 DIAGNOSIS — M810 Age-related osteoporosis without current pathological fracture: Secondary | ICD-10-CM | POA: Diagnosis present

## 2018-05-16 DIAGNOSIS — F419 Anxiety disorder, unspecified: Secondary | ICD-10-CM | POA: Diagnosis present

## 2018-05-16 DIAGNOSIS — I120 Hypertensive chronic kidney disease with stage 5 chronic kidney disease or end stage renal disease: Secondary | ICD-10-CM | POA: Diagnosis present

## 2018-05-16 DIAGNOSIS — I6340 Cerebral infarction due to embolism of unspecified cerebral artery: Secondary | ICD-10-CM

## 2018-05-16 DIAGNOSIS — Z881 Allergy status to other antibiotic agents status: Secondary | ICD-10-CM

## 2018-05-16 DIAGNOSIS — M109 Gout, unspecified: Secondary | ICD-10-CM | POA: Diagnosis present

## 2018-05-16 DIAGNOSIS — M2550 Pain in unspecified joint: Secondary | ICD-10-CM | POA: Diagnosis not present

## 2018-05-16 DIAGNOSIS — S82831D Other fracture of upper and lower end of right fibula, subsequent encounter for closed fracture with routine healing: Secondary | ICD-10-CM | POA: Diagnosis not present

## 2018-05-16 DIAGNOSIS — Z9181 History of falling: Secondary | ICD-10-CM | POA: Diagnosis not present

## 2018-05-16 DIAGNOSIS — I639 Cerebral infarction, unspecified: Secondary | ICD-10-CM | POA: Diagnosis not present

## 2018-05-16 DIAGNOSIS — I679 Cerebrovascular disease, unspecified: Secondary | ICD-10-CM | POA: Diagnosis not present

## 2018-05-16 DIAGNOSIS — R41841 Cognitive communication deficit: Secondary | ICD-10-CM | POA: Diagnosis present

## 2018-05-16 DIAGNOSIS — S0990XA Unspecified injury of head, initial encounter: Secondary | ICD-10-CM | POA: Diagnosis not present

## 2018-05-16 DIAGNOSIS — I1311 Hypertensive heart and chronic kidney disease without heart failure, with stage 5 chronic kidney disease, or end stage renal disease: Secondary | ICD-10-CM | POA: Diagnosis present

## 2018-05-16 DIAGNOSIS — M542 Cervicalgia: Secondary | ICD-10-CM | POA: Diagnosis not present

## 2018-05-16 LAB — PROTIME-INR
INR: 1.2
Prothrombin Time: 15.1 seconds (ref 11.4–15.2)

## 2018-05-16 LAB — COMPREHENSIVE METABOLIC PANEL
ALK PHOS: 92 U/L (ref 38–126)
ALT: 8 U/L (ref 0–44)
ANION GAP: 15 (ref 5–15)
AST: 13 U/L — ABNORMAL LOW (ref 15–41)
Albumin: 3.6 g/dL (ref 3.5–5.0)
BILIRUBIN TOTAL: 0.7 mg/dL (ref 0.3–1.2)
BUN: 37 mg/dL — ABNORMAL HIGH (ref 8–23)
CALCIUM: 8.1 mg/dL — AB (ref 8.9–10.3)
CO2: 21 mmol/L — AB (ref 22–32)
CREATININE: 9.38 mg/dL — AB (ref 0.44–1.00)
Chloride: 100 mmol/L (ref 98–111)
GFR, EST AFRICAN AMERICAN: 4 mL/min — AB (ref >60)
GFR, EST NON AFRICAN AMERICAN: 4 mL/min — AB (ref >60)
Glucose, Bld: 111 mg/dL — ABNORMAL HIGH (ref 70–99)
Potassium: 3.4 mmol/L — ABNORMAL LOW (ref 3.5–5.1)
SODIUM: 136 mmol/L (ref 135–145)
TOTAL PROTEIN: 7.8 g/dL (ref 6.5–8.1)

## 2018-05-16 LAB — CBC WITH DIFFERENTIAL/PLATELET
Abs Immature Granulocytes: 0.01 10*3/uL (ref 0.00–0.07)
BASOS ABS: 0 10*3/uL (ref 0.0–0.1)
Basophils Relative: 1 %
EOS PCT: 1 %
Eosinophils Absolute: 0.1 10*3/uL (ref 0.0–0.5)
HEMATOCRIT: 37.5 % (ref 36.0–46.0)
HEMOGLOBIN: 11.2 g/dL — AB (ref 12.0–15.0)
Immature Granulocytes: 0 %
LYMPHS ABS: 0.9 10*3/uL (ref 0.7–4.0)
LYMPHS PCT: 18 %
MCH: 25 pg — ABNORMAL LOW (ref 26.0–34.0)
MCHC: 29.9 g/dL — ABNORMAL LOW (ref 30.0–36.0)
MCV: 83.7 fL (ref 80.0–100.0)
Monocytes Absolute: 0.4 10*3/uL (ref 0.1–1.0)
Monocytes Relative: 7 %
NEUTROS PCT: 73 %
Neutro Abs: 3.5 10*3/uL (ref 1.7–7.7)
Platelets: 275 10*3/uL (ref 150–400)
RBC: 4.48 MIL/uL (ref 3.87–5.11)
RDW: 16.8 % — ABNORMAL HIGH (ref 11.5–15.5)
WBC: 4.9 10*3/uL (ref 4.0–10.5)
nRBC: 0 % (ref 0.0–0.2)

## 2018-05-16 LAB — SURGICAL PCR SCREEN
MRSA, PCR: NEGATIVE
Staphylococcus aureus: NEGATIVE

## 2018-05-16 LAB — RENAL FUNCTION PANEL
Albumin: 3.2 g/dL — ABNORMAL LOW (ref 3.5–5.0)
Anion gap: 12 (ref 5–15)
BUN: 42 mg/dL — AB (ref 8–23)
CHLORIDE: 101 mmol/L (ref 98–111)
CO2: 24 mmol/L (ref 22–32)
Calcium: 8.1 mg/dL — ABNORMAL LOW (ref 8.9–10.3)
Creatinine, Ser: 10.3 mg/dL — ABNORMAL HIGH (ref 0.44–1.00)
GFR calc Af Amer: 4 mL/min — ABNORMAL LOW (ref >60)
GFR, EST NON AFRICAN AMERICAN: 3 mL/min — AB (ref >60)
Glucose, Bld: 101 mg/dL — ABNORMAL HIGH (ref 70–99)
POTASSIUM: 4.5 mmol/L (ref 3.5–5.1)
Phosphorus: 4.6 mg/dL (ref 2.5–4.6)
Sodium: 137 mmol/L (ref 135–145)

## 2018-05-16 LAB — TYPE AND SCREEN
ABO/RH(D): B POS
Antibody Screen: NEGATIVE

## 2018-05-16 LAB — APTT: aPTT: 32 seconds (ref 24–36)

## 2018-05-16 LAB — SAMPLE TO BLOOD BANK

## 2018-05-16 MED ORDER — ATORVASTATIN CALCIUM 10 MG PO TABS
10.0000 mg | ORAL_TABLET | Freq: Every day | ORAL | Status: DC
  Administered 2018-05-16 – 2018-05-17 (×2): 10 mg via ORAL
  Filled 2018-05-16 (×2): qty 1

## 2018-05-16 MED ORDER — ASPIRIN EC 81 MG PO TBEC
81.0000 mg | DELAYED_RELEASE_TABLET | Freq: Every day | ORAL | Status: DC
  Administered 2018-05-16 – 2018-05-24 (×7): 81 mg via ORAL
  Filled 2018-05-16 (×7): qty 1

## 2018-05-16 MED ORDER — FENTANYL CITRATE (PF) 100 MCG/2ML IJ SOLN
50.0000 ug | Freq: Once | INTRAMUSCULAR | Status: AC
  Administered 2018-05-16: 50 ug via INTRAVENOUS
  Filled 2018-05-16: qty 2

## 2018-05-16 MED ORDER — ONDANSETRON HCL 4 MG/2ML IJ SOLN
4.0000 mg | Freq: Once | INTRAMUSCULAR | Status: AC
  Administered 2018-05-16: 4 mg via INTRAVENOUS
  Filled 2018-05-16: qty 2

## 2018-05-16 MED ORDER — ACETAMINOPHEN 325 MG PO TABS
650.0000 mg | ORAL_TABLET | Freq: Four times a day (QID) | ORAL | Status: DC | PRN
  Administered 2018-05-21: 650 mg via ORAL
  Filled 2018-05-16 (×2): qty 2

## 2018-05-16 MED ORDER — LIDOCAINE-PRILOCAINE 2.5-2.5 % EX CREA
1.0000 "application " | TOPICAL_CREAM | CUTANEOUS | Status: DC
  Filled 2018-05-16: qty 5

## 2018-05-16 MED ORDER — PENTOXIFYLLINE ER 400 MG PO TBCR
400.0000 mg | EXTENDED_RELEASE_TABLET | Freq: Three times a day (TID) | ORAL | Status: DC
  Administered 2018-05-16 – 2018-05-24 (×15): 400 mg via ORAL
  Filled 2018-05-16 (×26): qty 1

## 2018-05-16 MED ORDER — NIFEDIPINE ER OSMOTIC RELEASE 90 MG PO TB24
90.0000 mg | ORAL_TABLET | Freq: Every day | ORAL | Status: DC
  Filled 2018-05-16: qty 1

## 2018-05-16 MED ORDER — ONDANSETRON HCL 4 MG/2ML IJ SOLN
4.0000 mg | Freq: Four times a day (QID) | INTRAMUSCULAR | Status: DC | PRN
  Administered 2018-05-17: 4 mg via INTRAVENOUS

## 2018-05-16 MED ORDER — MUPIROCIN 2 % EX OINT: 1.0000 "application " | TOPICAL_OINTMENT | Freq: Two times a day (BID) | CUTANEOUS | Status: DC

## 2018-05-16 MED ORDER — MORPHINE SULFATE (PF) 2 MG/ML IV SOLN
2.0000 mg | Freq: Once | INTRAVENOUS | Status: AC
  Administered 2018-05-16: 2 mg via INTRAVENOUS
  Filled 2018-05-16: qty 1

## 2018-05-16 MED ORDER — VITAMIN D (ERGOCALCIFEROL) 1.25 MG (50000 UNIT) PO CAPS: 50000.0000 [IU] | ORAL_CAPSULE | ORAL | Status: DC

## 2018-05-16 MED ORDER — GABAPENTIN 100 MG PO CAPS
100.0000 mg | ORAL_CAPSULE | Freq: Every day | ORAL | Status: DC
  Administered 2018-05-16 – 2018-05-23 (×7): 100 mg via ORAL
  Filled 2018-05-16 (×7): qty 1

## 2018-05-16 MED ORDER — HYDROMORPHONE HCL 1 MG/ML IJ SOLN
0.5000 mg | INTRAMUSCULAR | Status: DC | PRN
  Administered 2018-05-16: 0.5 mg via INTRAVENOUS
  Filled 2018-05-16: qty 1

## 2018-05-16 MED ORDER — HEPARIN SODIUM (PORCINE) 5000 UNIT/ML IJ SOLN
5000.0000 [IU] | Freq: Three times a day (TID) | INTRAMUSCULAR | Status: DC
  Administered 2018-05-16 – 2018-05-20 (×9): 5000 [IU] via SUBCUTANEOUS
  Filled 2018-05-16 (×10): qty 1

## 2018-05-16 MED ORDER — CHLORHEXIDINE GLUCONATE CLOTH 2 % EX PADS
6.0000 | MEDICATED_PAD | Freq: Every day | CUTANEOUS | Status: DC
  Administered 2018-05-17 – 2018-05-21 (×4): 6 via TOPICAL

## 2018-05-16 MED ORDER — ALLOPURINOL 100 MG PO TABS
100.0000 mg | ORAL_TABLET | ORAL | Status: DC
  Administered 2018-05-24: 100 mg via ORAL
  Filled 2018-05-16 (×3): qty 1

## 2018-05-16 MED ORDER — HYDROMORPHONE HCL 1 MG/ML IJ SOLN
1.0000 mg | Freq: Once | INTRAMUSCULAR | Status: AC
  Administered 2018-05-16: 1 mg via INTRAVENOUS
  Filled 2018-05-16: qty 1

## 2018-05-16 MED ORDER — ALPRAZOLAM 0.5 MG PO TABS
0.5000 mg | ORAL_TABLET | Freq: Two times a day (BID) | ORAL | Status: DC | PRN
  Administered 2018-05-17 – 2018-05-18 (×2): 0.5 mg via ORAL
  Filled 2018-05-16 (×2): qty 1

## 2018-05-16 MED ORDER — FOLIC ACID 1 MG PO TABS
1.0000 mg | ORAL_TABLET | Freq: Two times a day (BID) | ORAL | Status: DC
  Administered 2018-05-16: 1 mg via ORAL
  Filled 2018-05-16: qty 1

## 2018-05-16 MED ORDER — LEVOTHYROXINE SODIUM 50 MCG PO TABS
50.0000 ug | ORAL_TABLET | Freq: Every day | ORAL | Status: DC
  Administered 2018-05-18 – 2018-05-24 (×5): 50 ug via ORAL
  Filled 2018-05-16 (×7): qty 1

## 2018-05-16 MED ORDER — HYDROCODONE-ACETAMINOPHEN 5-325 MG PO TABS
1.0000 | ORAL_TABLET | Freq: Four times a day (QID) | ORAL | Status: DC | PRN
  Administered 2018-05-16: 2 via ORAL
  Administered 2018-05-17 – 2018-05-24 (×11): 1 via ORAL
  Filled 2018-05-16 (×4): qty 1
  Filled 2018-05-16: qty 2
  Filled 2018-05-16 (×8): qty 1

## 2018-05-16 MED ORDER — MORPHINE SULFATE (PF) 4 MG/ML IV SOLN
4.0000 mg | Freq: Once | INTRAVENOUS | Status: AC
  Administered 2018-05-16: 4 mg via INTRAVENOUS
  Filled 2018-05-16: qty 1

## 2018-05-16 NOTE — ED Provider Notes (Signed)
Discussed clinical scenario with Dr. Arther Abbott.  He referred this patient to the orthopedic surgeon in [place].  Discussed with Dr. Marcelino Scot.  He will accept patient for consultation.  Will call hospitalist to arrange transfer   Nat Christen, MD 05/16/18 [phone]

## 2018-05-16 NOTE — Progress Notes (Addendum)
Patient ID: Sonya [name], female   DOB: 1951/02/25, 67 y.o.   MRN: [id number]  Right distal femur fracture in the ER  Patient with multiple medical problems pathologic fracture on dialysis status post recent revascularization of her lower extremity presents with a pathologic right femur fracture from renal disease.  I assessed the fracture and recommended the patient be sent to [facility] for definitive management

## 2018-05-16 NOTE — Significant Event (Signed)
Called about transfer about 67 year old with past medical history relevant for ESRD on Tuesday/Thursday/Saturday dialysis, gout, hyperlipidemia, hypothyroidism, hypertension, peripheral vascular disease status post balloon angioplasty and atherectomy on 04/05/2018 who presented to [facility] with mechanical fall found to have right distal femoral diaphysis fracture.  Patient is being transferred for orthopedic surgery evaluation to [facility] by Dr. Marcelino Scot.  Nephrology has been consulted Ms. Patrice Paradise will dialyze the patient prior to the surgery.

## 2018-05-16 NOTE — Progress Notes (Signed)
Patient transferred from [place] ED via carelink. Patient is awake, oriented, and her vitals are stable. She states her pain in 7/10 to the right leg. She was assisted into the bed from the stretcher by staff. Her daughter is at bedside. Will apply ice to right leg, administer pain meds per orders, and continue to monitor closely. Patient and family oriented to room, call bell, and nursing staff phone numbers.

## 2018-05-16 NOTE — Progress Notes (Signed)
Orthopedic Tech Progress Note Patient Details:  Sonya [name] [birth year] [id number]  Ortho Devices Type of Ortho Device: Knee Immobilizer Ortho Device/Splint Interventions: Application   Post Interventions Patient Tolerated: Well Instructions Provided: Care of device, Adjustment of device   [place] Pink 05/16/2018, 6:38 PM

## 2018-05-16 NOTE — Consult Note (Signed)
Orthopaedic Trauma Service (OTS) Consult   Patient ID: Sonya [name] MRN: [id number] DOB/AGE: 1950-11-08 67 y.o.   Reason for Consult: R distal femur fracture Referring Physician: Rolland Porter, MD    HPI: Sonya [name] is an 67 y.o. black female with a complex medical history including end-stage renal disease on hemodialysis Tuesday Thursday and Saturdays, recent bilateral lower extremity arteriogram with right-sided balloon angioplasty about 4 weeks ago for bilateral lower extremity limb ischemia and pain at rest who was at home this morning getting ready to go to dialysis when her right leg gave out from her while getting up off the commode.  Patient had immediate onset of pain and inability to bear weight.  She was brought to [facility] where she was found to have a right supracondylar distal femur fracture.  Due to patient's medical comorbidities orthopedic service at [facility] requested transfer to [facility] hospital for definitive treatment due to the availability of our resources as well as the fact that this is a complicated fracture requiring treatment by a fellowship trained orthopedic traumatologist.  Patient was transferred to the hospitalist service here at [facility].  Patient seen and evaluated by the orthopedic trauma service on arrival to the orthopedic floor.  Patient is already been seen and evaluated by renal team and is scheduled to have hemodialysis tomorrow.  Patient denies any additional injuries elsewhere.  She has been doing fairly well overall since her vascular procedure about a month ago.   Patient seen and evaluated 5 N. room 29.  Family is at bedside.  She complains only of right-sided leg pain.  Pain is exacerbated with motion.  Her right leg is resting in a flexed abducted and internally rotated position.  Her right leg is drawn.  She is not in any type of immobilizer.  Patient has not attempted to use bedpan.  Patient states that she does not urinate  anymore but does relieve herself when she has a bowel movements.  Denies incontinence   Patient did sustain a left femoral neck fracture back in 2016.  She does use assistive devices for mobilization   Past Medical History:  Diagnosis Date  . Anemia   . Anxiety   . CKD (chronic kidney disease)   . Diverticulitis   . Dysphagia   . ESRD (end stage renal disease) ([place])    On HD  . Fracture of femoral neck, left, closed ([place]) 05/30/2015  . Gastritis 06/2013 & 05/2015  . Gastroesophageal reflux disease with stricture 05/2015  . GERD (gastroesophageal reflux disease)   . HTN (hypertension)   . Hyperparathyroidism due to renal insufficiency ([place])   . Hypertensive urgency 06/2013 & 04/2015  . Hypothyroidism   . ICH (intracerebral hemorrhage) ([place]) 10/2014  . Meningocele ([place])   . Protein calorie malnutrition ([place])   . Stroke ([place])   . Uterine cancer ([place]) 2012  . Vertical diplopia   . Vertigo     Past Surgical History:  Procedure Laterality Date  . ABDOMINAL AORTOGRAM N/A 04/05/2018   Procedure: ABDOMINAL AORTOGRAM;  Surgeon: Marty Heck, MD;  Location: [place] [facility];  Service: Cardiovascular;  Laterality: N/A;  . ABDOMINAL HYSTERECTOMY    . APPENDECTOMY    . BASCILIC VEIN TRANSPOSITION Right 09/17/2013   Procedure: BRACHIAL VEIN TRANSPOSITION;  Surgeon: Conrad [place], MD;  Location: [place];  Service: Vascular;  Laterality: Right;  . BASCILIC VEIN TRANSPOSITION Right 10/29/2013   Procedure: RIGHT SECOND STAGE BRACHIAL VEIN TRANSPOSITION;  Surgeon: Conrad Elgin, MD;  Location: [place];  Service: Vascular;  Laterality: Right;  . CESAREAN SECTION    . CHOLECYSTECTOMY    . COLONOSCOPY  2000   TICS, IH  . COLONOSCOPY  2003 NUR BRBPR D50 V6   DC/[place] TICS, IH  . COLONOSCOPY N/A 09/04/2013   SLF: Small ulcer in the descending colon, one colon polyp (tubular adenoma), large internal hemorrhoids, moderate diverticulosis. next tcs 10 years.  . ESOPHAGOGASTRODUODENOSCOPY N/A 05/23/2015    [facility]:MCNO non-erosive gastritis/stricture at the gastroesophageal junction  . ESOPHAGOGASTRODUODENOSCOPY (EGD) WITH ESOPHAGEAL DILATION N/A 06/08/2013   Dr. Fields:Stricture at the gastroesophageal junction/MILD NON-erosive gastritis (inflammation) was found in the gastric antrum; multiple biopsies/NO SOURCE FOR ANEMIA IDETIFIED-MOST LIKELY DUE TO ANEMIA OF CHRONIC DISEASE  . FLEXIBLE SIGMOIDOSCOPY N/A 01/02/2016   Dr. Oneida Alar: external hemorrhoids, diverticulosis, internal hemorrhoids, proximal rectum normal  . GIVENS CAPSULE STUDY N/A 12/03/2013   SLF: occasional gastric erosion. small bowel normal  . HIP PINNING,CANNULATED Left 05/31/2015   Procedure: CANNULATED HIP PINNING;  Surgeon: Leandrew Koyanagi, MD;  Location: [place];  Service: Orthopedics;  Laterality: Left;  . INSERTION OF DIALYSIS CATHETER Left 09/17/2013   Procedure: INSERTION OF DIALYSIS CATHETER;  Surgeon: Conrad [place], MD;  Location: [place];  Service: Vascular;  Laterality: Left;  . LAPAROSCOPIC TOTAL HYSTERECTOMY    . LOWER EXTREMITY ANGIOGRAPHY Bilateral 04/05/2018   Procedure: Lower Extremity Angiography;  Surgeon: Marty Heck, MD;  Location: [place] [facility];  Service: Cardiovascular;  Laterality: Bilateral;  . NASAL SEPTUM SURGERY    . PERIPHERAL VASCULAR ATHERECTOMY Right 04/05/2018   Procedure: PERIPHERAL VASCULAR ATHERECTOMY;  Surgeon: Marty Heck, MD;  Location: [place] [facility];  Service: Cardiovascular;  Laterality: Right;  Peroneal   . PERIPHERAL VASCULAR BALLOON ANGIOPLASTY Right 04/05/2018   Procedure: PERIPHERAL VASCULAR BALLOON ANGIOPLASTY;  Surgeon: Marty Heck, MD;  Location: [place] [facility];  Service: Cardiovascular;  Laterality: Right;  Posterior tibial  . SAVORY DILATION N/A 05/23/2015   Procedure: SAVORY DILATION;  Surgeon: Danie Binder, MD;  [facility];  Service: Endoscopy;  Laterality: N/A;  . THROMBECTOMY AND REVISION OF ARTERIOVENTOUS (AV) GORETEX  GRAFT Right 07/06/2017    Procedure: THROMBECTOMY AND REVISION OF ARTERIOVENOUS (AV) GORETEX  GRAFT RIGHT UPPER ARM;  Surgeon: Rosetta Posner, MD;  Location: [facility];  Service: Vascular;  Laterality: Right;    Family History  Problem Relation Age of Onset  . Cancer Mother   . Diabetes Mother   . Hypertension Mother   . Diabetes Father   . Hypertension Father   . Hypertension Sister   . Colon cancer Neg Hx     Social History:  reports that she has never smoked. She has never used smokeless tobacco. She reports that she does not drink alcohol or use drugs.  Allergies:  Allergies  Allergen Reactions  . Cephalexin Itching    Medications: I have reviewed the patient's current medications.  Current Meds  Medication Sig  . allopurinol (ZYLOPRIM) 100 MG tablet Take 100 mg by mouth every Wednesday.   . ALPRAZolam (XANAX) 0.5 MG tablet Take 1 tablet (0.5 mg total) by mouth 2 (two) times daily as needed for anxiety. (Patient taking differently: Take 0.5 mg by mouth 2 (two) times daily. )  . aspirin EC 81 MG EC tablet Take 1 tablet (81 mg total) by mouth daily.  Marland Kitchen atorvastatin (LIPITOR) 10 MG tablet Take 10 mg by mouth daily.  Marland Kitchen  cinacalcet (SENSIPAR) 30 MG tablet Take 30 mg by mouth daily.  . clopidogrel (PLAVIX) 75 MG tablet Take 1 tablet (75 mg total) by mouth daily.  . diphenhydrAMINE (BENADRYL) 25 mg capsule Take 25 mg by mouth 2 (two) times daily.  . folic acid (FOLVITE) 1 MG tablet Take 1 mg by mouth 2 (two) times daily.   Marland Kitchen gabapentin (NEURONTIN) 100 MG capsule Take 100 mg by mouth at bedtime.  Marland Kitchen levothyroxine (SYNTHROID, LEVOTHROID) 50 MCG tablet Take 1 tablet (50 mcg total) by mouth daily. (Patient taking differently: Take 50 mcg by mouth daily before breakfast. )  . lidocaine-prilocaine (EMLA) cream Apply 1 application topically every Tuesday, Thursday, and Saturday at 6 PM.   . NIFEdipine (ADALAT CC) 90 MG 24 hr tablet Take 90 mg by mouth daily.   . pentoxifylline (TRENTAL) 400 MG CR tablet Take 400 mg by  mouth 3 (three) times daily with meals.  . Vitamin D, Ergocalciferol, (DRISDOL) 50000 units CAPS capsule Take 50,000 Units by mouth every Monday.      Results for orders placed or performed during the hospital encounter of 05/16/18 (from the past 48 hour(s))  Comprehensive metabolic panel     Status: Abnormal   Collection Time: 05/16/18  6:20 AM  Result Value Ref Range   Sodium 136 135 - 145 mmol/L   Potassium 3.4 (L) 3.5 - 5.1 mmol/L   Chloride 100 98 - 111 mmol/L   CO2 21 (L) 22 - 32 mmol/L   Glucose, Bld 111 (H) 70 - 99 mg/dL   BUN 37 (H) 8 - 23 mg/dL   Creatinine, Ser 9.38 (H) 0.44 - 1.00 mg/dL   Calcium 8.1 (L) 8.9 - 10.3 mg/dL   Total Protein 7.8 6.5 - 8.1 g/dL   Albumin 3.6 3.5 - 5.0 g/dL   AST 13 (L) 15 - 41 U/L   ALT 8 0 - 44 U/L   Alkaline Phosphatase 92 38 - 126 U/L   Total Bilirubin 0.7 0.3 - 1.2 mg/dL   GFR calc non Af Amer 4 (L) >60 mL/min   GFR calc Af Amer 4 (L) >60 mL/min    Comment: (NOTE) The eGFR has been calculated using the CKD EPI equation. This calculation has not been validated in all clinical situations. eGFR's persistently <60 mL/min signify possible Chronic Kidney Disease.    Anion gap 15 5 - 15    Comment: Performed at [facility], [address] [place], [place] [address]  CBC with Differential     Status: Abnormal   Collection Time: 05/16/18  6:20 AM  Result Value Ref Range   WBC 4.9 4.0 - 10.5 K/uL   RBC 4.48 3.87 - 5.11 MIL/uL   Hemoglobin 11.2 (L) 12.0 - 15.0 g/dL   HCT 37.5 36.0 - 46.0 %   MCV 83.7 80.0 - 100.0 fL   MCH 25.0 (L) 26.0 - 34.0 pg   MCHC 29.9 (L) 30.0 - 36.0 g/dL   RDW 16.8 (H) 11.5 - 15.5 %   Platelets 275 150 - 400 K/uL   nRBC 0.0 0.0 - 0.2 %   Neutrophils Relative % 73 %   Neutro Abs 3.5 1.7 - 7.7 K/uL   Lymphocytes Relative 18 %   Lymphs Abs 0.9 0.7 - 4.0 K/uL   Monocytes Relative 7 %   Monocytes Absolute 0.4 0.1 - 1.0 K/uL   Eosinophils Relative 1 %   Eosinophils Absolute 0.1 0.0 - 0.5 K/uL   Basophils  Relative 1 %  Basophils Absolute 0.0 0.0 - 0.1 K/uL   Immature Granulocytes 0 %   Abs Immature Granulocytes 0.01 0.00 - 0.07 K/uL    Comment: Performed at [facility], [address] [place], [place] [address]  Protime-INR     Status: None   Collection Time: 05/16/18  6:20 AM  Result Value Ref Range   Prothrombin Time 15.1 11.4 - 15.2 seconds   INR 1.20     Comment: Performed at [facility], [address], [place], [place] [address]  APTT     Status: None   Collection Time: 05/16/18  6:20 AM  Result Value Ref Range   aPTT 32 24 - 36 seconds    Comment: Performed at [facility], [address], [place], [place] [address]  Sample to Blood Bank     Status: None   Collection Time: 05/16/18  6:20 AM  Result Value Ref Range   Blood Bank Specimen SAMPLE AVAILABLE FOR TESTING    Sample Expiration      05/17/2018 Performed at [facility], [address] [place], [place] [address]     Dg Chest 1 View  Result Date: 05/16/2018 CLINICAL DATA:  Fall this morning. EXAM: CHEST  1 VIEW COMPARISON:  09/26/2017 FINDINGS: The heart size and mediastinal contours are within normal limits. Both lungs are clear. The visualized skeletal structures are unremarkable. Calcification of the aorta. IMPRESSION: No active disease. Electronically Signed   By: Lucienne Capers M.D.   On: 05/16/2018 06:10   Dg Knee Complete 4 Views Right  Result Date: 05/16/2018 CLINICAL DATA:  67 y/o F; fall from toilet with right posterior knee pain. EXAM: RIGHT KNEE - COMPLETE 4+ VIEW; DG HIP (WITH OR WITHOUT PELVIS) 2-3V RIGHT COMPARISON:  None. FINDINGS: Right hip: Pin fixation of the left femur neck. Hip joints, pubic symphysis, and sacroiliac joints are well maintained. No pelvic fracture or diastasis. No acute fracture of the right proximal femur identified. Vascular calcifications noted. Right knee: Acute comminuted 1/2 shaft's width displaced fracture of the distal femoral diaphysis within anterior and lateral apex  angulation. No knee joint dislocation. Osteoarthrosis of the knee joint with lateral greater than medial femorotibial compartment joint space narrowing and periarticular osteophytes. No joint effusion. Vascular calcifications noted. IMPRESSION: Acute comminuted 1/2 shaft's width displaced fracture of the right distal femoral diaphysis within anterior and lateral apex angulation. Electronically Signed   By: Kristine Garbe M.D.   On: 05/16/2018 06:10   Dg Hip Unilat W Or Wo Pelvis 2-3 Views Right  Result Date: 05/16/2018 CLINICAL DATA:  67 y/o F; fall from toilet with right posterior knee pain. EXAM: RIGHT KNEE - COMPLETE 4+ VIEW; DG HIP (WITH OR WITHOUT PELVIS) 2-3V RIGHT COMPARISON:  None. FINDINGS: Right hip: Pin fixation of the left femur neck. Hip joints, pubic symphysis, and sacroiliac joints are well maintained. No pelvic fracture or diastasis. No acute fracture of the right proximal femur identified. Vascular calcifications noted. Right knee: Acute comminuted 1/2 shaft's width displaced fracture of the distal femoral diaphysis within anterior and lateral apex angulation. No knee joint dislocation. Osteoarthrosis of the knee joint with lateral greater than medial femorotibial compartment joint space narrowing and periarticular osteophytes. No joint effusion. Vascular calcifications noted. IMPRESSION: Acute comminuted 1/2 shaft's width displaced fracture of the right distal femoral diaphysis within anterior and lateral apex angulation. Electronically Signed   By: Kristine Garbe M.D.   On: 05/16/2018 06:10    Review of Systems  Constitutional: Negative for chills and fever.  Cardiovascular: Negative for chest pain and palpitations.  Gastrointestinal: Negative for nausea and vomiting.   Blood pressure (!) 158/69, pulse 93, temperature 98.8 F (37.1 C), temperature source Oral, resp. rate 16, height '5\' 5"'$  (1.651 m), weight 61.2 kg, SpO2 100 %. Physical Exam  Constitutional:   Pleasant black female older than stated age, cooperative, no acute distress  Cardiovascular:  S1 and S2, systolic murmur  Pulmonary/Chest: No respiratory distress.  Decreased breath sounds bilaterally  Abdominal: Soft. There is no tenderness.  +BS  Musculoskeletal:  Pelvis--no traumatic wounds or rash, no ecchymosis, stable to manual stress, nontender  Right Lower Extremity Inspection: R leg is shortened, flexed at knee and internally rotated at hip and knee  No open wounds  Bony eval: TTP R distal femur Lower leg nontender Hip nontender  Soft tissue: No significant swelling noted  No open wounds  Sensation: DPN, SPN, TN sensation intact Motor: EHL, FHL, lesser toe motor intact Ankle flexion, extension, inv and ever intact Vascular:  + DP pulse noted  good color distally    Color symmetric to contra-lateral side   Psychiatric: She has a normal mood and affect. Her speech is normal and behavior is normal.     Assessment/Plan:  67 year old black female with complex medical history with acute right supracondylar distal femur fracture  -Right supracondylar distal femur fracture related to end-stage renal disease and metabolic bone disease  Plan for OR tomorrow afternoon for ORIF right distal femur  Will likely allow patient to be partial weightbearing postoperatively with unrestricted range of motion   With the assistance of nurse and [employer] we did apply a knee immobilizer to the right leg for comfort and to put the patient in a better position until surgery.  Patient tolerated this well.  She was given 2 mg of morphine IV.  She did have some discomfort with me pulling on her ankle complaining of ankle pain and not thigh pain.  Need to monitor this and x-ray ankle if pain persists.  Did not feel any crepitus or gross motion at her ankle.   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

## 2018-05-16 NOTE — Progress Notes (Signed)
Orthopaedic Trauma Service   Full consult to follow Complex medical history  R supracondylar distal femur fracture OR tomorrow for ORIF with Dr. Doreatha Martin, early afternoon    Jari Pigg, PA-C [facility] [phone] (C) [phone] (O) 05/16/2018 4:20 PM

## 2018-05-16 NOTE — Consult Note (Signed)
Reason for Consult:ESRD, HTN Referring Physician: Dr. [name] is an 67 y.o. female.  HPI: 67 yr female on HD at [facility], TTS with Dr. Hinda Lenis.  Extensive Hx with ESRD from HTN, Anemia , HPTH, uterine Ca, IC bleed, L femur fx, GERD with stricture, hypthyroid, CVA, Divertic dz, Meningocoele, who fell today going out of home and has R femur rx. Tx from [facility] for Orthopedic repair. Was to have HD today.   No prob at HD, Has RUA AVG.  Denies SOB, cough, D, N, V, Cramps, itching, fevers, chills. Does not remember other leg fx.   Constitutional: no C/o, but hx malnutrition Eyes: negative Ears, nose, mouth, throat, and face: no teeth Respiratory: negative Cardiovascular: edema of legs Gastrointestinal: negative Genitourinary:negative Integument/breast: negative Musculoskeletal:see HPI Neurological: family notes memory issues Behavioral/Psych: anxiety by hx Allergic/Immunologic: Keflex   Dialyzes at  on [facility] since 2015. Primary Nephrologist [place]. .  Access RUA AVG.  Past Medical History:  Diagnosis Date  . Anemia   . Anxiety   . CKD (chronic kidney disease)   . Diverticulitis   . Dysphagia   . ESRD (end stage renal disease) ([place])    On HD  . Fracture of femoral neck, left, closed ([place]) 05/30/2015  . Gastritis 06/2013 & 05/2015  . Gastroesophageal reflux disease with stricture 05/2015  . GERD (gastroesophageal reflux disease)   . HTN (hypertension)   . Hyperparathyroidism due to renal insufficiency ([place])   . Hypertensive urgency 06/2013 & 04/2015  . Hypothyroidism   . ICH (intracerebral hemorrhage) ([place]) 10/2014  . 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 Protein calorie malnutrition ([place])   . Stroke ([place])   . Uterine cancer ([place]) 2012  . Vertical diplopia   . Vertigo     Past Surgical History:  Procedure Laterality Date  . ABDOMINAL AORTOGRAM N/A 04/05/2018   Procedure: ABDOMINAL AORTOGRAM;  Surgeon: Marty Heck, MD;  Location: [place] [facility];   Service: Cardiovascular;  Laterality: N/A;  . ABDOMINAL HYSTERECTOMY    . APPENDECTOMY    . BASCILIC VEIN TRANSPOSITION Right 09/17/2013   Procedure: BRACHIAL VEIN TRANSPOSITION;  Surgeon: Conrad Lebanon, MD;  Location: [place];  Service: Vascular;  Laterality: Right;  . BASCILIC VEIN TRANSPOSITION Right 10/29/2013   Procedure: RIGHT SECOND STAGE BRACHIAL VEIN TRANSPOSITION;  Surgeon: Conrad Shamrock, MD;  Location: [place];  Service: Vascular;  Laterality: Right;  . CESAREAN SECTION    . CHOLECYSTECTOMY    . COLONOSCOPY  2000   TICS, IH  . COLONOSCOPY  2003 NUR BRBPR D50 V6   [facility]/[place] TICS, IH  . COLONOSCOPY N/A 09/04/2013   SLF: Small ulcer in the descending colon, one colon polyp (tubular adenoma), large internal hemorrhoids, moderate diverticulosis. next tcs 10 years.  . ESOPHAGOGASTRODUODENOSCOPY N/A 05/23/2015   TDS:KAJG non-erosive gastritis/stricture at the gastroesophageal junction  . ESOPHAGOGASTRODUODENOSCOPY (EGD) WITH ESOPHAGEAL DILATION N/A 06/08/2013   Dr. Fields:Stricture at the gastroesophageal junction/MILD NON-erosive gastritis (inflammation) was found in the gastric antrum; multiple biopsies/NO SOURCE FOR ANEMIA IDETIFIED-MOST LIKELY DUE TO ANEMIA OF CHRONIC DISEASE  . FLEXIBLE SIGMOIDOSCOPY N/A 01/02/2016   Dr. Oneida Alar: external hemorrhoids, diverticulosis, internal hemorrhoids, proximal rectum normal  . GIVENS CAPSULE STUDY N/A 12/03/2013   SLF: occasional gastric erosion. small bowel normal  . HIP PINNING,CANNULATED Left 05/31/2015   Procedure: CANNULATED HIP PINNING;  Surgeon: Leandrew Koyanagi, MD;  Location: [place];  Service: Orthopedics;  Laterality: Left;  . INSERTION OF DIALYSIS CATHETER Left 09/17/2013  Procedure: INSERTION OF DIALYSIS CATHETER;  Surgeon: Conrad Tunnelton, MD;  Location: [place];  Service: Vascular;  Laterality: Left;  . LAPAROSCOPIC TOTAL HYSTERECTOMY    . LOWER EXTREMITY ANGIOGRAPHY Bilateral 04/05/2018   Procedure: Lower Extremity Angiography;  Surgeon: Marty Heck, MD;  Location: [place] [facility];  Service: Cardiovascular;  Laterality: Bilateral;  . NASAL SEPTUM SURGERY    . PERIPHERAL VASCULAR ATHERECTOMY Right 04/05/2018   Procedure: PERIPHERAL VASCULAR ATHERECTOMY;  Surgeon: Marty Heck, MD;  Location: [place] [facility];  Service: Cardiovascular;  Laterality: Right;  Peroneal   . PERIPHERAL VASCULAR BALLOON ANGIOPLASTY Right 04/05/2018   Procedure: PERIPHERAL VASCULAR BALLOON ANGIOPLASTY;  Surgeon: Marty Heck, MD;  Location: [place] [facility];  Service: Cardiovascular;  Laterality: Right;  Posterior tibial  . SAVORY DILATION N/A 05/23/2015   Procedure: SAVORY DILATION;  Surgeon: Danie Binder, MD;  [facility];  Service: Endoscopy;  Laterality: N/A;  . THROMBECTOMY AND REVISION OF ARTERIOVENTOUS (AV) GORETEX  GRAFT Right 07/06/2017   Procedure: THROMBECTOMY AND REVISION OF ARTERIOVENOUS (AV) GORETEX  GRAFT RIGHT UPPER ARM;  Surgeon: Rosetta Posner, MD;  [facility];  Service: Vascular;  Laterality: Right;    Family History  Problem Relation Age of Onset  . Cancer Mother   . Diabetes Mother   . Hypertension Mother   . Diabetes Father   . Hypertension Father   . Hypertension Sister   . Colon cancer Neg Hx     Social History:  reports that she has never smoked. She has never used smokeless tobacco. She reports that she does not drink alcohol or use drugs.  Allergies:  Allergies  Allergen Reactions  . Cephalexin Itching    Medications:  I have reviewed the patient's current medications. Prior to Admission:  Medications Prior to Admission  Medication Sig Dispense Refill Last Dose  . allopurinol (ZYLOPRIM) 100 MG tablet Take 100 mg by mouth every Wednesday.    05/15/2018 at Unknown time  . ALPRAZolam (XANAX) 0.5 MG tablet Take 1 tablet (0.5 mg total) by mouth 2 (two) times daily as needed for anxiety. (Patient taking differently: Take 0.5 mg by mouth 2 (two) times daily. ) 10 tablet 0 Past Week at Unknown  time  . aspirin EC 81 MG EC tablet Take 1 tablet (81 mg total) by mouth daily. 30 tablet 1 05/15/2018 at Unknown time  . atorvastatin (LIPITOR) 10 MG tablet Take 10 mg by mouth daily.   05/15/2018 at Unknown time  . cinacalcet (SENSIPAR) 30 MG tablet Take 30 mg by mouth daily.     . clopidogrel (PLAVIX) 75 MG tablet Take 1 tablet (75 mg total) by mouth daily. 30 tablet 11 05/15/2018 at 0600  . diphenhydrAMINE (BENADRYL) 25 mg capsule Take 25 mg by mouth 2 (two) times daily.   05/15/2018 at Unknown time  . folic acid (FOLVITE) 1 MG tablet Take 1 mg by mouth 2 (two) times daily.    05/15/2018 at Unknown time  . gabapentin (NEURONTIN) 100 MG capsule Take 100 mg by mouth at bedtime.   05/15/2018 at Unknown time  . levothyroxine (SYNTHROID, LEVOTHROID) 50 MCG tablet Take 1 tablet (50 mcg total) by mouth daily. (Patient taking differently: Take 50 mcg by mouth daily before breakfast. ) 30 tablet 0 05/15/2018 at Unknown time  . lidocaine-prilocaine (EMLA) cream Apply 1 application topically every Tuesday, Thursday, and Saturday at 6 PM.    Past Week at Unknown time  .  NIFEdipine (ADALAT CC) 90 MG 24 hr tablet Take 90 mg by mouth daily.   3 05/15/2018 at Unknown time  . pentoxifylline (TRENTAL) 400 MG CR tablet Take 400 mg by mouth 3 (three) times daily with meals.  11 05/15/2018 at Unknown time  . Vitamin D, Ergocalciferol, (DRISDOL) 50000 units CAPS capsule Take 50,000 Units by mouth every Monday.    05/15/2018 at Unknown time    Results for orders placed or performed during the hospital encounter of 05/16/18 (from the past 48 hour(s))  Comprehensive metabolic panel     Status: Abnormal   Collection Time: 05/16/18  6:20 AM  Result Value Ref Range   Sodium 136 135 - 145 mmol/L   Potassium 3.4 (L) 3.5 - 5.1 mmol/L   Chloride 100 98 - 111 mmol/L   CO2 21 (L) 22 - 32 mmol/L   Glucose, Bld 111 (H) 70 - 99 mg/dL   BUN 37 (H) 8 - 23 mg/dL   Creatinine, Ser 9.38 (H) 0.44 - 1.00 mg/dL   Calcium 8.1 (L)  8.9 - 10.3 mg/dL   Total Protein 7.8 6.5 - 8.1 g/dL   Albumin 3.6 3.5 - 5.0 g/dL   AST 13 (L) 15 - 41 U/L   ALT 8 0 - 44 U/L   Alkaline Phosphatase 92 38 - 126 U/L   Total Bilirubin 0.7 0.3 - 1.2 mg/dL   GFR calc non Af Amer 4 (L) >60 mL/min   GFR calc Af Amer 4 (L) >60 mL/min    Comment: (NOTE) The eGFR has been calculated using the CKD EPI equation. This calculation has not been validated in all clinical situations. eGFR's persistently <60 mL/min signify possible Chronic Kidney Disease.    Anion gap 15 5 - 15    Comment: Performed at [facility], [address], [place], [place] [address]  CBC with Differential     Status: Abnormal   Collection Time: 05/16/18  6:20 AM  Result Value Ref Range   WBC 4.9 4.0 - 10.5 K/uL   RBC 4.48 3.87 - 5.11 MIL/uL   Hemoglobin 11.2 (L) 12.0 - 15.0 g/dL   HCT 37.5 36.0 - 46.0 %   MCV 83.7 80.0 - 100.0 fL   MCH 25.0 (L) 26.0 - 34.0 pg   MCHC 29.9 (L) 30.0 - 36.0 g/dL   RDW 16.8 (H) 11.5 - 15.5 %   Platelets 275 150 - 400 K/uL   nRBC 0.0 0.0 - 0.2 %   Neutrophils Relative % 73 %   Neutro Abs 3.5 1.7 - 7.7 K/uL   Lymphocytes Relative 18 %   Lymphs Abs 0.9 0.7 - 4.0 K/uL   Monocytes Relative 7 %   Monocytes Absolute 0.4 0.1 - 1.0 K/uL   Eosinophils Relative 1 %   Eosinophils Absolute 0.1 0.0 - 0.5 K/uL   Basophils Relative 1 %   Basophils Absolute 0.0 0.0 - 0.1 K/uL   Immature Granulocytes 0 %   Abs Immature Granulocytes 0.01 0.00 - 0.07 K/uL    Comment: Performed at [facility], [address], [place], [place] [address]  Protime-INR     Status: None   Collection Time: 05/16/18  6:20 AM  Result Value Ref Range   Prothrombin Time 15.1 11.4 - 15.2 seconds   INR 1.20     Comment: Performed at [facility], [address] [place], [place] [address]  APTT     Status: None   Collection Time: 05/16/18  6:20 AM  Result Value  Ref Range   aPTT 32 24 - 36 seconds    Comment: Performed at [facility], [address], [place], [place] [address]   Sample to Blood Bank     Status: None   Collection Time: 05/16/18  6:20 AM  Result Value Ref Range   Blood Bank Specimen SAMPLE AVAILABLE FOR TESTING    Sample Expiration      05/17/2018 Performed at [facility], [address] [place], [place] [address]     Dg Chest 1 View  Result Date: 05/16/2018 CLINICAL DATA:  Fall this morning. EXAM: CHEST  1 VIEW COMPARISON:  09/26/2017 FINDINGS: The heart size and mediastinal contours are within normal limits. Both lungs are clear. The visualized skeletal structures are unremarkable. Calcification of the aorta. IMPRESSION: No active disease. Electronically Signed   By: Lucienne Capers M.D.   On: 05/16/2018 06:10   Dg Knee Complete 4 Views Right  Result Date: 05/16/2018 CLINICAL DATA:  67 y/o F; fall from toilet with right posterior knee pain. EXAM: RIGHT KNEE - COMPLETE 4+ VIEW; DG HIP (WITH OR WITHOUT PELVIS) 2-3V RIGHT COMPARISON:  None. FINDINGS: Right hip: Pin fixation of the left femur neck. Hip joints, pubic symphysis, and sacroiliac joints are well maintained. No pelvic fracture or diastasis. No acute fracture of the right proximal femur identified. Vascular calcifications noted. Right knee: Acute comminuted 1/2 shaft's width displaced fracture of the distal femoral diaphysis within anterior and lateral apex angulation. No knee joint dislocation. Osteoarthrosis of the knee joint with lateral greater than medial femorotibial compartment joint space narrowing and periarticular osteophytes. No joint effusion. Vascular calcifications noted. IMPRESSION: Acute comminuted 1/2 shaft's width displaced fracture of the right distal femoral diaphysis within anterior and lateral apex angulation. Electronically Signed   By: Kristine Garbe M.D.   On: 05/16/2018 06:10   Dg Hip Unilat W Or Wo Pelvis 2-3 Views Right  Result Date: 05/16/2018 CLINICAL DATA:  67 y/o F; fall from toilet with right posterior knee pain. EXAM: RIGHT KNEE - COMPLETE 4+ VIEW;  DG HIP (WITH OR WITHOUT PELVIS) 2-3V RIGHT COMPARISON:  None. FINDINGS: Right hip: Pin fixation of the left femur neck. Hip joints, pubic symphysis, and sacroiliac joints are well maintained. No pelvic fracture or diastasis. No acute fracture of the right proximal femur identified. Vascular calcifications noted. Right knee: Acute comminuted 1/2 shaft's width displaced fracture of the distal femoral diaphysis within anterior and lateral apex angulation. No knee joint dislocation. Osteoarthrosis of the knee joint with lateral greater than medial femorotibial compartment joint space narrowing and periarticular osteophytes. No joint effusion. Vascular calcifications noted. IMPRESSION: Acute comminuted 1/2 shaft's width displaced fracture of the right distal femoral diaphysis within anterior and lateral apex angulation. Electronically Signed   By: Kristine Garbe M.D.   On: 05/16/2018 06:10    ROS Blood pressure (!) 158/69, pulse 93, temperature 98.8 F (37.1 C), temperature source Oral, resp. rate 16, height _0  (1.651 m), weight 61.2 kg, SpO2 100 %. Physical Exam Physical Examination: General appearance - chronically ill appearing and NAD Mental status - ? Some confusion Eyes - catarracts, HTN changees Mouth - edentulous Neck - adenopathy noted PCL Lymphatics - posterior cervical nodes Chest - decreased air entry noted bilat Heart - S1 and S2 normal, systolic murmur HQ7/5 at 2nd left intercostal space, cont M at ULSB, goes down with comp of AVG Abdomen - soft, liver down 4 cm, pos bs Musculoskeletal - R Leg rotated in Extremities - pedal edema 1 +,  DP 1+ Skin - normal coloration and turgor, no rashes, no suspicious skin lesions noted  Assessment/Plan: 1 Hip fx.per Ortho 2 ESRD: some vol xs , plan HD tomorrow 3 Hypertension: not an issue, see how does periop period 4. Anemia of ESRD: ok no meds needed now 5. Metabolic Bone Disease: ? PTH 6 Hx CVA 7 Hx ICB 8 Divertic dz P Hd  tomorrow, meds, check PTH,    Sonya [name] 05/16/2018, 4:00 PM

## 2018-05-16 NOTE — ED Provider Notes (Signed)
[facility] EMERGENCY DEPARTMENT Provider Note   CSN: [id number] Arrival date & time: 05/16/18  0448  Time seen 05:00 AM   History   Chief Complaint Chief Complaint  Patient presents with  . Fall    HPI Sonya [name] is a 67 y.o. female.  HPI patient states she got up to go to the bathroom and was using her walker.  She was sitting on the toilet and when she tried to stand up her right leg gave out on her and she fell onto her right leg.  She states she has pain behind her right knee and has been unable to use her leg since she fell.  She states she feels like her leg is "locked up".  She denies hitting her head.  She denies any other injury from the fall.  She denies having shortness of breath, she has some nausea.  Patient has end-stage renal disease and is supposed to have dialysis at 6 AM today at [facility].  Patient had a fracture of her of her left hip before.  PCP [employer]mond [employer], MD     Past Medical History:  Diagnosis Date  . Anemia   . Anxiety   . CKD (chronic kidney disease)   . Diverticulitis   . Dysphagia   . ESRD (end stage renal disease) ([place])    On HD  . Fracture of femoral neck, left, closed ([place]) 05/30/2015  . Gastritis 06/2013 & 05/2015  . Gastroesophageal reflux disease with stricture 05/2015  . GERD (gastroesophageal reflux disease)   . HTN (hypertension)   . Hyperparathyroidism due to renal insufficiency ([place])   . Hypertensive urgency 06/2013 & 04/2015  . Hypothyroidism   . ICH (intracerebral hemorrhage) ([place]) 10/2014  . 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 Protein calorie malnutrition ([place])   . Stroke ([place])   . Uterine cancer ([place]) 2012  . Vertical diplopia   . Vertigo     Patient Active Problem List   Diagnosis Date Noted  . PAD (peripheral artery disease) ([place]) 03/28/2018  . Atherosclerotic cerebrovascular disease   . End stage renal failure on dialysis ([place])   . Altered mental state 09/28/2017  . Acute ischemic stroke ([place]) 09/28/2017  . Acute  metabolic encephalopathy 65/10/5463  . Hyperlipidemia 09/27/2017  . Altered mental status 09/26/2017  . Lactic acidosis 09/26/2017  . Anxiety state 02/03/2017  . Malnutrition of moderate degree 01/19/2017  . Hypoglycemia 01/18/2017  . 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 ESRD (end stage renal disease) ([place])   . Abnormal weight loss 12/01/2015  . Pulmonary edema 08/19/2015  . CAP (community acquired pneumonia) 08/19/2015  . Hypothyroidism, adult 06/14/2015  . HA (headache) 06/13/2015  . HTN (hypertension), malignant 06/13/2015  . Elective surgery   . Fracture of femoral neck, left, closed ([place]) 05/30/2015  . Hip fracture ([place]) 05/30/2015  . Closed fracture of neck of left femur ([place])   . Gastroesophageal reflux disease with stricture 05/03/2015  . UTI (lower urinary tract infection) 04/14/2015  . Orthostatic hypotension 11/27/2014  . 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 Acute encephalopathy   . Confusion   . Protein-calorie malnutrition, severe ([place]) 10/15/2014  . Dysphagia 10/15/2014  . Intracerebral bleed ([place]) 10/14/2014  . End stage renal disease ([place]) 08/31/2013  . Symptomatic anemia 08/18/2013  . Hyperparathyroidism due to renal insufficiency ([place]) 07/09/2013  . Constipation 06/21/2013  . Gastritis 06/21/2013  . Chronic kidney disease 06/20/2013  . Anemia in chronic kidney disease 06/08/2013  . Gout  06/08/2013  . Other dysphagia 06/07/2013  . Essential hypertension 04/08/2011  . Colon cancer screening 04/08/2011    Past Surgical History:  Procedure Laterality Date  . ABDOMINAL AORTOGRAM N/A 04/05/2018   Procedure: ABDOMINAL AORTOGRAM;  Surgeon: Marty Heck, MD;  Location: [facility];  Service: Cardiovascular;  Laterality: N/A;  . ABDOMINAL HYSTERECTOMY    . APPENDECTOMY    . BASCILIC VEIN TRANSPOSITION Right 09/17/2013   Procedure: BRACHIAL VEIN TRANSPOSITION;  Surgeon: Conrad Asherton, MD;  Location: [facility];   Service: Vascular;  Laterality: Right;  . BASCILIC VEIN TRANSPOSITION Right 10/29/2013   Procedure: RIGHT SECOND STAGE BRACHIAL VEIN TRANSPOSITION;  Surgeon: Conrad Laurinburg, MD;  Location: [facility];  Service: Vascular;  Laterality: Right;  . CESAREAN SECTION    . CHOLECYSTECTOMY    . COLONOSCOPY  2000   TICS, IH  . COLONOSCOPY  2003 NUR BRBPR D50 V6   DC/[place] TICS, IH  . COLONOSCOPY N/A 09/04/2013   SLF: Small ulcer in the descending colon, one colon polyp (tubular adenoma), large internal hemorrhoids, moderate diverticulosis. next tcs 10 years.  . ESOPHAGOGASTRODUODENOSCOPY N/A 05/23/2015   BMW:UXLK non-erosive gastritis/stricture at the gastroesophageal junction  . ESOPHAGOGASTRODUODENOSCOPY (EGD) WITH ESOPHAGEAL DILATION N/A 06/08/2013   Dr. Fields:Stricture at the gastroesophageal junction/MILD NON-erosive gastritis (inflammation) was found in the gastric antrum; multiple biopsies/NO SOURCE FOR ANEMIA IDETIFIED-MOST LIKELY DUE TO ANEMIA OF CHRONIC DISEASE  . FLEXIBLE SIGMOIDOSCOPY N/A 01/02/2016   Dr. Oneida Alar: external hemorrhoids, diverticulosis, internal hemorrhoids, proximal rectum normal  . GIVENS CAPSULE STUDY N/A 12/03/2013   SLF: occasional gastric erosion. small bowel normal  . HIP PINNING,CANNULATED Left 05/31/2015   Procedure: CANNULATED HIP PINNING;  Surgeon: Leandrew Koyanagi, MD;  Location: [facility];  Service: Orthopedics;  Laterality: Left;  . INSERTION OF DIALYSIS CATHETER Left 09/17/2013   Procedure: INSERTION OF DIALYSIS CATHETER;  Surgeon: Conrad Timberlane, MD;  Location: [facility];  Service: Vascular;  Laterality: Left;  . LAPAROSCOPIC TOTAL HYSTERECTOMY    . LOWER EXTREMITY ANGIOGRAPHY Bilateral 04/05/2018   Procedure: Lower Extremity Angiography;  Surgeon: Marty Heck, MD;  Location: [facility];  Service: Cardiovascular;  Laterality: Bilateral;  . NASAL SEPTUM SURGERY    . PERIPHERAL VASCULAR ATHERECTOMY Right 04/05/2018   Procedure: PERIPHERAL VASCULAR ATHERECTOMY;  Surgeon: Marty Heck, MD;  Location: [facility];  Service: Cardiovascular;  Laterality: Right;  Peroneal   . PERIPHERAL VASCULAR BALLOON ANGIOPLASTY Right 04/05/2018   Procedure: PERIPHERAL VASCULAR BALLOON ANGIOPLASTY;  Surgeon: Marty Heck, MD;  Location: [facility];  Service: Cardiovascular;  Laterality: Right;  Posterior tibial  . SAVORY DILATION N/A 05/23/2015   Procedure: SAVORY DILATION;  Surgeon: Danie Binder, MD;  [facility];  Service: Endoscopy;  Laterality: N/A;  . THROMBECTOMY AND REVISION OF ARTERIOVENTOUS (AV) GORETEX  GRAFT Right 07/06/2017   Procedure: THROMBECTOMY AND REVISION OF ARTERIOVENOUS (AV) GORETEX  GRAFT RIGHT UPPER ARM;  Surgeon: Rosetta Posner, MD;  Location: [facility];  Service: Vascular;  Laterality: Right;     OB History    Gravida  1   Para  1   Term  1   Preterm      AB      Living        SAB      TAB      Ectopic      Multiple      Live Births  Home Medications    Prior to Admission medications   Medication Sig Start Date End Date Taking? Authorizing Provider  allopurinol (ZYLOPRIM) 100 MG tablet Take 100 mg by mouth every Wednesday.     [provider]  ALPRAZolam Duanne Moron) 0.5 MG tablet Take 1 tablet (0.5 mg total) by mouth 2 (two) times daily as needed for anxiety. Patient taking differently: Take 0.5 mg by mouth 2 (two) times daily.  09/29/17   Orson Eva, MD  aspirin EC 81 MG EC tablet Take 1 tablet (81 mg total) by mouth daily. 09/29/17   Orson Eva, MD  atorvastatin (LIPITOR) 10 MG tablet Take 10 mg by mouth daily.    [provider]  clopidogrel (PLAVIX) 75 MG tablet Take 1 tablet (75 mg total) by mouth daily. 04/05/18 04/05/19  Marty Heck, MD  diphenhydrAMINE (BENADRYL) 25 mg capsule Take 25 mg by mouth 2 (two) times daily.    [provider]  folic acid (FOLVITE) 1 MG tablet Take 1 mg by mouth 2 (two) times daily.     [provider]  gabapentin  (NEURONTIN) 100 MG capsule Take 100 mg by mouth at bedtime.    [provider]  levothyroxine (SYNTHROID, LEVOTHROID) 50 MCG tablet Take 1 tablet (50 mcg total) by mouth daily. Patient taking differently: Take 50 mcg by mouth daily before breakfast.  04/17/15   Samuella Cota, MD  lidocaine-prilocaine (EMLA) cream Apply 1 application topically every Tuesday, Thursday, and Saturday at 6 PM.     [provider]  NIFEdipine (ADALAT CC) 90 MG 24 hr tablet Take 90 mg by mouth daily.  02/08/18   [provider]  pentoxifylline (TRENTAL) 400 MG CR tablet Take 400 mg by mouth 3 (three) times daily with meals. 03/17/18   [provider]  Vitamin D, Ergocalciferol, (DRISDOL) 50000 units CAPS capsule Take 50,000 Units by mouth every Monday.     [provider]    Family History Family History  Problem Relation Age of Onset  . Cancer Mother   . Diabetes Mother   . Hypertension Mother   . Diabetes Father   . Hypertension Father   . Hypertension Sister   . Colon cancer Neg Hx     Social History Social History   Tobacco Use  . Smoking status: Never Smoker  . Smokeless tobacco: Never Used  Substance Use Topics  . Alcohol use: No  . Drug use: No  uses a walker Lives at home   Allergies   Cephalexin   Review of Systems Review of Systems  All other systems reviewed and are negative.    Physical Exam Updated Vital Signs BP 137/70   Pulse 94   Temp 99.2 F (37.3 C) (Oral)   Resp 15   Ht 5\' 5"  (1.651 m)   Wt 61.2 kg   SpO2 100%   BMI 22.45 kg/m   Vital signs normal    Physical Exam  Constitutional: She is oriented to person, place, and time. She appears well-developed and well-nourished.  Non-toxic appearance. She does not appear ill. No distress.  HENT:  Head: Normocephalic and atraumatic.  Right Ear: External ear normal.  Left Ear: External ear normal.  Nose: Nose normal. No mucosal edema or rhinorrhea.  Mouth/Throat:  Oropharynx is clear and moist and mucous membranes are normal. No dental abscesses or uvula swelling.  Eyes: Pupils are equal, round, and reactive to light. Conjunctivae and EOM are normal.  Neck: Normal range of motion  and full passive range of motion without pain. Neck supple.  Cardiovascular: Normal rate, regular rhythm and normal heart sounds. Exam reveals no gallop and no friction rub.  No murmur heard. Pulmonary/Chest: Effort normal and breath sounds normal. No respiratory distress. She has no wheezes. She has no rhonchi. She has no rales. She exhibits no tenderness and no crepitus.  Abdominal: Normal appearance.  Musculoskeletal: She exhibits tenderness and deformity. She exhibits no edema.  Patient's right leg is abnormal, her knee is flexed and displaced medially, she is nontender to palpation in her hip area but the hip joint does not feel right.  She has some mild enlargement of the right knee without obvious effusion.  Neurological: She is alert and oriented to person, place, and time. She has normal strength. No cranial nerve deficit.  Skin: Skin is warm, dry and intact. No rash noted. No erythema. No pallor.  Psychiatric: She has a normal mood and affect. Her speech is normal and behavior is normal. Her mood appears not anxious.  Nursing note and vitals reviewed.      ED Treatments / Results  Labs (all labs ordered are listed, but only abnormal results are displayed) Results for orders placed or performed during the hospital encounter of 05/16/18  Comprehensive metabolic panel  Result Value Ref Range   Sodium 136 135 - 145 mmol/L   Potassium 3.4 (L) 3.5 - 5.1 mmol/L   Chloride 100 98 - 111 mmol/L   CO2 21 (L) 22 - 32 mmol/L   Glucose, Bld 111 (H) 70 - 99 mg/dL   BUN 37 (H) 8 - 23 mg/dL   Creatinine, Ser 9.38 (H) 0.44 - 1.00 mg/dL   Calcium 8.1 (L) 8.9 - 10.3 mg/dL   Total Protein 7.8 6.5 - 8.1 g/dL   Albumin 3.6 3.5 - 5.0 g/dL   AST 13 (L) 15 - 41 U/L   ALT 8 0 - 44  U/L   Alkaline Phosphatase 92 38 - 126 U/L   Total Bilirubin 0.7 0.3 - 1.2 mg/dL   GFR calc non Af Amer 4 (L) >60 mL/min   GFR calc Af Amer 4 (L) >60 mL/min   Anion gap 15 5 - 15  CBC with Differential  Result Value Ref Range   WBC 4.9 4.0 - 10.5 K/uL   RBC 4.48 3.87 - 5.11 MIL/uL   Hemoglobin 11.2 (L) 12.0 - 15.0 g/dL   HCT 37.5 36.0 - 46.0 %   MCV 83.7 80.0 - 100.0 fL   MCH 25.0 (L) 26.0 - 34.0 pg   MCHC 29.9 (L) 30.0 - 36.0 g/dL   RDW 16.8 (H) 11.5 - 15.5 %   Platelets 275 150 - 400 K/uL   nRBC 0.0 0.0 - 0.2 %   Neutrophils Relative % 73 %   Neutro Abs 3.5 1.7 - 7.7 K/uL   Lymphocytes Relative 18 %   Lymphs Abs 0.9 0.7 - 4.0 K/uL   Monocytes Relative 7 %   Monocytes Absolute 0.4 0.1 - 1.0 K/uL   Eosinophils Relative 1 %   Eosinophils Absolute 0.1 0.0 - 0.5 K/uL   Basophils Relative 1 %   Basophils Absolute 0.0 0.0 - 0.1 K/uL   Immature Granulocytes 0 %   Abs Immature Granulocytes 0.01 0.00 - 0.07 K/uL  Protime-INR  Result Value Ref Range   Prothrombin Time 15.1 11.4 - 15.2 seconds   INR 1.20   APTT  Result Value Ref Range   aPTT 32 24 - 36 seconds  Laboratory interpretation all normal except renal failure, mild hypokalemia, mild anemia    EKG EKG Interpretation  Date/Time:  Tuesday May 16 2018 04:58:27 EDT Ventricular Rate:  97 PR Interval:    QRS Duration: 74 QT Interval:  380 QTC Calculation: 483 R Axis:   63 Text Interpretation:  Sinus rhythm LAE, consider biatrial enlargement Consider anterior infarct Since last tracing 09 Dec 2017 peaked T waves are no longer present Confirmed by Rolland Porter [phone]) on 05/16/2018 5:54:00 AM   Radiology Dg Chest 1 View  Result Date: 05/16/2018 CLINICAL DATA:  Fall this morning. EXAM: CHEST  1 VIEW COMPARISON:  09/26/2017 FINDINGS: The heart size and mediastinal contours are within normal limits. Both lungs are clear. The visualized skeletal structures are unremarkable. Calcification of the aorta. IMPRESSION: No  active disease. Electronically Signed   By: Lucienne Capers M.D.   On: 05/16/2018 06:10   Dg Knee Complete 4 Views Right  Result Date: 05/16/2018 CLINICAL DATA:  67 y/o F; fall from toilet with right posterior knee pain. EXAM: RIGHT KNEE - COMPLETE 4+ VIEW; DG HIP (WITH OR WITHOUT PELVIS) 2-3V RIGHT COMPARISON:  None. FINDINGS: Right hip: Pin fixation of the left femur neck. Hip joints, pubic symphysis, and sacroiliac joints are well maintained. No pelvic fracture or diastasis. No acute fracture of the right proximal femur identified. Vascular calcifications noted. Right knee: Acute comminuted 1/2 shaft's width displaced fracture of the distal femoral diaphysis within anterior and lateral apex angulation. No knee joint dislocation. Osteoarthrosis of the knee joint with lateral greater than medial femorotibial compartment joint space narrowing and periarticular osteophytes. No joint effusion. Vascular calcifications noted. IMPRESSION: Acute comminuted 1/2 shaft's width displaced fracture of the right distal femoral diaphysis within anterior and lateral apex angulation. Electronically Signed   By: Kristine Garbe M.D.   On: 05/16/2018 06:10   Dg Hip Unilat W Or Wo Pelvis 2-3 Views Right  Result Date: 05/16/2018 CLINICAL DATA:  67 y/o F; fall from toilet with right posterior knee pain. EXAM: RIGHT KNEE - COMPLETE 4+ VIEW; DG HIP (WITH OR WITHOUT PELVIS) 2-3V RIGHT COMPARISON:  None. FINDINGS: Right hip: Pin fixation of the left femur neck. Hip joints, pubic symphysis, and sacroiliac joints are well maintained. No pelvic fracture or diastasis. No acute fracture of the right proximal femur identified. Vascular calcifications noted. Right knee: Acute comminuted 1/2 shaft's width displaced fracture of the distal femoral diaphysis within anterior and lateral apex angulation. No knee joint dislocation. Osteoarthrosis of the knee joint with lateral greater than medial femorotibial compartment joint space  narrowing and periarticular osteophytes. No joint effusion. Vascular calcifications noted. IMPRESSION: Acute comminuted 1/2 shaft's width displaced fracture of the right distal femoral diaphysis within anterior and lateral apex angulation. Electronically Signed   By: Kristine Garbe M.D.   On: 05/16/2018 06:10    Procedures Procedures (including critical care time)  Medications Ordered in ED Medications  ondansetron (ZOFRAN) injection 4 mg (has no administration in time range)  fentaNYL (SUBLIMAZE) injection 50 mcg (50 mcg Intravenous Given 05/16/18 0532)  ondansetron (ZOFRAN) injection 4 mg (4 mg Intravenous Given 05/16/18 0532)  HYDROmorphone (DILAUDID) injection 1 mg (1 mg Intravenous Given 05/16/18 0639)  ondansetron (ZOFRAN) injection 4 mg (4 mg Intravenous Given 05/16/18 0639)     Initial Impression / Assessment and Plan / ED Course  I have reviewed the triage vital signs and the nursing notes.  Pertinent labs & imaging results that were available during my care of the patient were  reviewed by me and considered in my medical decision making (see chart for details).     Patient was given pain and nausea medication.  X-rays of her hip and her knee were obtained.  I suspect patient could had a dislocated hip and has referred pain to her knee.  Or she could have a primary knee problem but I suspect it is more likely to originate in her hip.  6:08 AM I have reviewed her x-rays and patient has a femur fracture that is just distal to be midline.  There is some displacement.  Her hip appears to be located appropriately.  I talked to the patient and her family and she would prefer to stay here and have Dr. Aline Brochure fix her fracture.  I also looked in Dr. Lowanda Foster is on call today for dialysis.  Lab work was ordered, she is requesting more pain medicine and was given Dilaudid.  06:54 AM Dr Aline Brochure, Orthopedics, is on his way to the hospital, will look at her xrays to see if he can  repair her fracture.   07:15 AM Dr Lacinda Axon will see if Dr Aline Brochure is going to repair this fx or if she needs to be referred back to Dr Erlinda Hong.   Final Clinical Impressions(s) / ED Diagnoses   Final diagnoses:  Closed displaced comminuted fracture of shaft of right femur, initial encounter ([place])  ESRD (end stage renal disease) ([place])    Plan admission  Rolland Porter, MD, [name], MD 05/16/18 [phone]

## 2018-05-16 NOTE — ED Triage Notes (Signed)
Pt comes from home via [facility]. Pt states she was sitting on the toilet, stood up and fell back down to the floor. Pt C/O right knee pain.

## 2018-05-16 NOTE — ED Notes (Signed)
Dr Tat at bedside 

## 2018-05-16 NOTE — H&P (Signed)
History and Physical  Sonya [name] WPY:[id number] DOB: June 12, 1951 DOA: 05/16/2018   PCP: Redmond School, MD   Patient coming from: Home  Chief Complaint: mechanical fall  HPI:  Sonya [name] is a 67 y.o. female with medical history of  anemia of CKD, anxiety, ESRD on hemodialysis Tuesday/Thursday/Saturday, diverticulosis/diverticulitis, history of dysphagia, history of fracture of left femoral neck, GERD, hypertension, hyperparathyroidism due to renal insufficiency, hypothyroidism, intracerebral hemorrhage, meningocele, protein calorie malnutrition, ischemic stroke, history of uterine cancer presenting after mechanical fall going to the bathroom.  The patient states that she was using her walker.  She was trying to get up off the commode when her right leg "gave out", and the patient fell onto her right side.  The patient had extreme pain and was unable to bear any weight.  EMS was activated.  In the emergency department, x-rays of the hip revealed an acute comminuted fracture of the right femoral diaphysis with half shaft width displacement.  The patient denied any syncope, chest discomfort, shortness breath, or aura.  The patient had been in her usual state of health prior to mechanical fall.  She denied any fevers, chills, nausea, vomiting, diarrhea, abdominal pain, hematochezia, melena. In the emergency department, the patient had a low-grade temperature of 99.8 F.  Otherwise she was hemodynamically stable saturating 100% room air.  BMP showed potassium 3.4, bicarbonate 21, serum creatinine 9.3.  LFTs were unremarkable.  Hemoglobin was 11.2 which is at her baseline.  Chest x-ray was negative.  Hip x-rays as discussed above.  Assessment/Plan: Acute comminuted fracture of the right femur -EDP spoke with ortho, Dr. Altamese [place] who agreed to see pt in consult after transfer to [facility] -pain control -PT eval after surgery -holding plavix  ESRD -notified [facility] of  consult--Dr. Jimmy Footman -due for HD 05/16/18  Secondary hyperparathyroidism -Management per nephrology  Essential hypertension -Continue nifedipine -BP controlled  Anemia of CKD -Baseline hemoglobin 11-12 -Hemoglobin at baseline  History of ischemic stroke -continue ASA -holding plavix for anticipation of surgery -continue statin  Hyperlipidemia -Continue statin--increase atorvastatin to40 mg daily 09/29/17---LDL--93  Hypothyroidism -continue synthroid         Past Medical History:  Diagnosis Date  . Anemia   . Anxiety   . CKD (chronic kidney disease)   . Diverticulitis   . Dysphagia   . ESRD (end stage renal disease) ([place])    On HD  . Fracture of femoral neck, left, closed ([place]) 05/30/2015  . Gastritis 06/2013 & 05/2015  . Gastroesophageal reflux disease with stricture 05/2015  . GERD (gastroesophageal reflux disease)   . HTN (hypertension)   . Hyperparathyroidism due to renal insufficiency ([place])   . Hypertensive urgency 06/2013 & 04/2015  . Hypothyroidism   . ICH (intracerebral hemorrhage) ([place]) 10/2014  . Meningocele ([place])   . Protein calorie malnutrition ([place])   . Stroke ([place])   . Uterine cancer ([place]) 2012  . Vertical diplopia   . Vertigo    Past Surgical History:  Procedure Laterality Date  . ABDOMINAL AORTOGRAM N/A 04/05/2018   Procedure: ABDOMINAL AORTOGRAM;  Surgeon: Marty Heck, MD;  Location: [place] [facility];  Service: Cardiovascular;  Laterality: N/A;  . ABDOMINAL HYSTERECTOMY    . APPENDECTOMY    . BASCILIC VEIN TRANSPOSITION Right 09/17/2013   Procedure: BRACHIAL VEIN TRANSPOSITION;  Surgeon: Conrad Davison, MD;  Location: [place];  Service: Vascular;  Laterality: Right;  . BASCILIC VEIN TRANSPOSITION Right 10/29/2013  Procedure: RIGHT SECOND STAGE BRACHIAL VEIN TRANSPOSITION;  Surgeon: Conrad [place], MD;  Location: [place];  Service: Vascular;  Laterality: Right;  . CESAREAN SECTION    . CHOLECYSTECTOMY    . COLONOSCOPY  2000    TICS, IH  . COLONOSCOPY  2003 NUR BRBPR D50 V6   DC/[place] TICS, IH  . COLONOSCOPY N/A 09/04/2013   SLF: Small ulcer in the descending colon, one colon polyp (tubular adenoma), large internal hemorrhoids, moderate diverticulosis. next tcs 10 years.  . ESOPHAGOGASTRODUODENOSCOPY N/A 05/23/2015   WUJ:WJXB non-erosive gastritis/stricture at the gastroesophageal junction  . ESOPHAGOGASTRODUODENOSCOPY (EGD) WITH ESOPHAGEAL DILATION N/A 06/08/2013   Dr. Fields:Stricture at the gastroesophageal junction/MILD NON-erosive gastritis (inflammation) was found in the gastric antrum; multiple biopsies/NO SOURCE FOR ANEMIA IDETIFIED-MOST LIKELY DUE TO ANEMIA OF CHRONIC DISEASE  . FLEXIBLE SIGMOIDOSCOPY N/A 01/02/2016   Dr. Oneida Alar: external hemorrhoids, diverticulosis, internal hemorrhoids, proximal rectum normal  . GIVENS CAPSULE STUDY N/A 12/03/2013   SLF: occasional gastric erosion. small bowel normal  . HIP PINNING,CANNULATED Left 05/31/2015   Procedure: CANNULATED HIP PINNING;  Surgeon: Leandrew Koyanagi, MD;  Location: [place];  Service: Orthopedics;  Laterality: Left;  . INSERTION OF DIALYSIS CATHETER Left 09/17/2013   Procedure: INSERTION OF DIALYSIS CATHETER;  Surgeon: Conrad Vona, MD;  Location: [place];  Service: Vascular;  Laterality: Left;  . LAPAROSCOPIC TOTAL HYSTERECTOMY    . LOWER EXTREMITY ANGIOGRAPHY Bilateral 04/05/2018   Procedure: Lower Extremity Angiography;  Surgeon: Marty Heck, MD;  Location: [place] [facility];  Service: Cardiovascular;  Laterality: Bilateral;  . NASAL SEPTUM SURGERY    . PERIPHERAL VASCULAR ATHERECTOMY Right 04/05/2018   Procedure: PERIPHERAL VASCULAR ATHERECTOMY;  Surgeon: Marty Heck, MD;  Location: [place] [facility];  Service: Cardiovascular;  Laterality: Right;  Peroneal   . PERIPHERAL VASCULAR BALLOON ANGIOPLASTY Right 04/05/2018   Procedure: PERIPHERAL VASCULAR BALLOON ANGIOPLASTY;  Surgeon: Marty Heck, MD;  Location: [place] [facility];  Service:  Cardiovascular;  Laterality: Right;  Posterior tibial  . SAVORY DILATION N/A 05/23/2015   Procedure: SAVORY DILATION;  Surgeon: Danie Binder, MD;  [facility];  Service: Endoscopy;  Laterality: N/A;  . THROMBECTOMY AND REVISION OF ARTERIOVENTOUS (AV) GORETEX  GRAFT Right 07/06/2017   Procedure: THROMBECTOMY AND REVISION OF ARTERIOVENOUS (AV) GORETEX  GRAFT RIGHT UPPER ARM;  Surgeon: Rosetta Posner, MD;  Location: [place] [facility];  Service: Vascular;  Laterality: Right;   Social History:  reports that she has never smoked. She has never used smokeless tobacco. She reports that she does not drink alcohol or use drugs.   Family History  Problem Relation Age of Onset  . Cancer Mother   . Diabetes Mother   . Hypertension Mother   . Diabetes Father   . Hypertension Father   . Hypertension Sister   . Colon cancer Neg Hx      Allergies  Allergen Reactions  . Cephalexin Itching     Prior to Admission medications   Medication Sig Start Date End Date Taking? Authorizing Provider  allopurinol (ZYLOPRIM) 100 MG tablet Take 100 mg by mouth every Wednesday.     [provider]  ALPRAZolam Duanne Moron) 0.5 MG tablet Take 1 tablet (0.5 mg total) by mouth 2 (two) times daily as needed for anxiety. Patient taking differently: Take 0.5 mg by mouth 2 (two) times daily.  09/29/17   Orson Eva, MD  aspirin EC 81 MG EC tablet Take 1 tablet (81 mg total)  by mouth daily. 09/29/17   Orson Eva, MD  atorvastatin (LIPITOR) 10 MG tablet Take 10 mg by mouth daily.    [provider]  clopidogrel (PLAVIX) 75 MG tablet Take 1 tablet (75 mg total) by mouth daily. 04/05/18 04/05/19  Marty Heck, MD  diphenhydrAMINE (BENADRYL) 25 mg capsule Take 25 mg by mouth 2 (two) times daily.    [provider]  folic acid (FOLVITE) 1 MG tablet Take 1 mg by mouth 2 (two) times daily.     [provider]  gabapentin (NEURONTIN) 100 MG capsule Take 100 mg by mouth at bedtime.    [provider]  levothyroxine (SYNTHROID, LEVOTHROID) 50 MCG tablet Take 1 tablet (50 mcg total) by mouth daily. Patient taking differently: Take 50 mcg by mouth daily before breakfast.  04/17/15   Samuella Cota, MD  lidocaine-prilocaine (EMLA) cream Apply 1 application topically every Tuesday, Thursday, and Saturday at 6 PM.     [provider]  NIFEdipine (ADALAT CC) 90 MG 24 hr tablet Take 90 mg by mouth daily.  02/08/18   [provider]  pentoxifylline (TRENTAL) 400 MG CR tablet Take 400 mg by mouth 3 (three) times daily with meals. 03/17/18   [provider]  Vitamin D, Ergocalciferol, (DRISDOL) 50000 units CAPS capsule Take 50,000 Units by mouth every Monday.     [provider]    Review of Systems:  Constitutional:  No weight loss, night sweats, Fevers, chills, fatigue.  Head&Eyes: No headache.  No vision loss.  No eye pain or scotoma ENT:  No Difficulty swallowing,Tooth/dental problems,Sore throat,  No ear ache, post nasal drip,  Cardio-vascular:  No chest pain, Orthopnea, PND, swelling in lower extremities,  dizziness, palpitations  GI:  No  abdominal pain, nausea, vomiting, diarrhea, loss of appetite, hematochezia, melena, heartburn, indigestion, Resp:  No shortness of breath with exertion or at rest. No cough. No coughing up of blood .No wheezing.No chest wall deformity  Skin:  no rash or lesions.  GU:  no dysuria, change in color of urine, no urgency or frequency. No flank pain.  Musculoskeletal:  Right hip/leg pain Psych:  No change in mood or affect. No depression or anxiety. Neurologic: No headache, no dysesthesia, no focal weakness, no vision loss. No syncope  Physical Exam: Vitals:   05/16/18 0638 05/16/18 0700 05/16/18 0730 05/16/18 0830  BP: 128/68 124/63 130/64 136/66  Pulse: 87 84 83 85  Resp: (!) 21 11 15 16   Temp:      TempSrc:      SpO2: 100% 90% 94% 99%  Weight:      Height:       General:  A&O x 3, NAD,  nontoxic, pleasant/cooperative Head/Eye: No conjunctival hemorrhage, no icterus, [place]/AT, No nystagmus ENT:  No icterus,  No thrush, good dentition, no pharyngeal exudate Neck:  No masses, no lymphadenpathy, no bruits CV:  RRR, no rub, no gallop, no S3 Lung:  CTAB, good air movement, no wheeze, no rhonchi Abdomen: soft/NT, +BS, nondistended, no peritoneal signs Ext: No cyanosis, No rashes, No petechiae, No lymphangitis, 2 + RLE edema Neuro: CNII-XII intact, strength 4/5 in bilateral upper and lower extremities, no dysmetria  Labs on Admission:  Basic Metabolic Panel: Recent Labs  Lab 05/16/18 0620  NA 136  K 3.4*  CL 100  CO2 21*  GLUCOSE 111*  BUN 37*  CREATININE 9.38*  CALCIUM 8.1*   Liver Function Tests: Recent Labs  Lab 05/16/18 [phone]  AST 13*  ALT 8  ALKPHOS 92  BILITOT 0.7  PROT 7.8  ALBUMIN 3.6   No results for input(s): LIPASE, AMYLASE in the last 168 hours. No results for input(s): AMMONIA in the last 168 hours. CBC: Recent Labs  Lab 05/16/18 0620  WBC 4.9  NEUTROABS 3.5  HGB 11.2*  HCT 37.5  MCV 83.7  PLT 275   Coagulation Profile: Recent Labs  Lab 05/16/18 0620  INR 1.20   Cardiac Enzymes: No results for input(s): CKTOTAL, CKMB, CKMBINDEX, TROPONINI in the last 168 hours. BNP: Invalid input(s): POCBNP CBG: No results for input(s): GLUCAP in the last 168 hours. Urine analysis:    Component Value Date/Time   COLORURINE YELLOW 04/14/2015 1427   APPEARANCEUR CLEAR 04/14/2015 1427   LABSPEC 1.020 04/14/2015 1427   PHURINE 8.5 (H) 04/14/2015 1427   GLUCOSEU 100 (A) 04/14/2015 1427   HGBUR SMALL (A) 04/14/2015 1427   BILIRUBINUR NEGATIVE 04/14/2015 1427   KETONESUR NEGATIVE 04/14/2015 1427   PROTEINUR >300 (A) 04/14/2015 1427   UROBILINOGEN 0.2 04/14/2015 1427   NITRITE NEGATIVE 04/14/2015 1427   LEUKOCYTESUR TRACE (A) 04/14/2015 1427   Sepsis Labs: @LABRCNTIP (procalcitonin:4,lacticidven:4) )No results found for this or any previous visit  (from the past 240 hour(s)).   Radiological Exams on Admission: Dg Chest 1 View  Result Date: 05/16/2018 CLINICAL DATA:  Fall this morning. EXAM: CHEST  1 VIEW COMPARISON:  09/26/2017 FINDINGS: The heart size and mediastinal contours are within normal limits. Both lungs are clear. The visualized skeletal structures are unremarkable. Calcification of the aorta. IMPRESSION: No active disease. Electronically Signed   By: Lucienne Capers M.D.   On: 05/16/2018 06:10   Dg Knee Complete 4 Views Right  Result Date: 05/16/2018 CLINICAL DATA:  67 y/o F; fall from toilet with right posterior knee pain. EXAM: RIGHT KNEE - COMPLETE 4+ VIEW; DG HIP (WITH OR WITHOUT PELVIS) 2-3V RIGHT COMPARISON:  None. FINDINGS: Right hip: Pin fixation of the left femur neck. Hip joints, pubic symphysis, and sacroiliac joints are well maintained. No pelvic fracture or diastasis. No acute fracture of the right proximal femur identified. Vascular calcifications noted. Right knee: Acute comminuted 1/2 shaft's width displaced fracture of the distal femoral diaphysis within anterior and lateral apex angulation. No knee joint dislocation. Osteoarthrosis of the knee joint with lateral greater than medial femorotibial compartment joint space narrowing and periarticular osteophytes. No joint effusion. Vascular calcifications noted. IMPRESSION: Acute comminuted 1/2 shaft's width displaced fracture of the right distal femoral diaphysis within anterior and lateral apex angulation. Electronically Signed   By: Kristine Garbe M.D.   On: 05/16/2018 06:10   Dg Hip Unilat W Or Wo Pelvis 2-3 Views Right  Result Date: 05/16/2018 CLINICAL DATA:  67 y/o F; fall from toilet with right posterior knee pain. EXAM: RIGHT KNEE - COMPLETE 4+ VIEW; DG HIP (WITH OR WITHOUT PELVIS) 2-3V RIGHT COMPARISON:  None. FINDINGS: Right hip: Pin fixation of the left femur neck. Hip joints, pubic symphysis, and sacroiliac joints are well maintained. No pelvic  fracture or diastasis. No acute fracture of the right proximal femur identified. Vascular calcifications noted. Right knee: Acute comminuted 1/2 shaft's width displaced fracture of the distal femoral diaphysis within anterior and lateral apex angulation. No knee joint dislocation. Osteoarthrosis of the knee joint with lateral greater than medial femorotibial compartment joint space narrowing and periarticular osteophytes. No joint effusion. Vascular calcifications noted. IMPRESSION: Acute comminuted 1/2 shaft's width displaced fracture of the right distal femoral diaphysis within anterior and  lateral apex angulation. Electronically Signed   By: Kristine Garbe M.D.   On: 05/16/2018 06:10    EKG: Independently reviewed. Sinus, no ST-T changes    Time spent:60 minutes Code Status:   FULL Family Communication:  Daughter updated at bedside Disposition Plan: expect 2-3 day hospitalization Consults called: Ortho--[place]; renal--Deterding DVT Prophylaxis: [place] Heparin    Orson Eva, DO  [facility] Pager [phone]  If 7PM-7AM, please contact night-coverage [email or web address] Password [email or web address] 05/16/2018, 10:16 AM

## 2018-05-16 NOTE — H&P (View-Only) (Signed)
Orthopaedic Trauma Service (OTS) Consult   Patient ID: Sonya [name] MRN: [id number] DOB/AGE: Apr 01, 1951 68 y.o.   Reason for Consult: R distal femur fracture Referring Physician: Rolland Porter, MD    HPI: Sonya [name] is an 67 y.o. black female with a complex medical history including end-stage renal disease on hemodialysis Tuesday Thursday and Saturdays, recent bilateral lower extremity arteriogram with right-sided balloon angioplasty about 4 weeks ago for bilateral lower extremity limb ischemia and pain at rest who was at home this morning getting ready to go to dialysis when her right leg gave out from her while getting up off the commode.  Patient had immediate onset of pain and inability to bear weight.  She was brought to [facility] where she was found to have a right supracondylar distal femur fracture.  Due to patient's medical comorbidities orthopedic service at [facility] requested transfer to [facility] hospital for definitive treatment due to the availability of our resources as well as the fact that this is a complicated fracture requiring treatment by a fellowship trained orthopedic traumatologist.  Patient was transferred to the hospitalist service here at [facility]  Patient seen and evaluated by the orthopedic trauma service on arrival to the orthopedic floor.  Patient is already been seen and evaluated by renal team and is scheduled to have hemodialysis tomorrow.  Patient denies any additional injuries elsewhere.  She has been doing fairly well overall since her vascular procedure about a month ago.   Patient seen and evaluated 5 N. room 29.  Family is at bedside.  She complains only of right-sided leg pain.  Pain is exacerbated with motion.  Her right leg is resting in a flexed abducted and internally rotated position.  Her right leg is drawn.  She is not in any type of immobilizer.  Patient has not attempted to use bedpan.  Patient states that she does not urinate  anymore but does relieve herself when she has a bowel movements.  Denies incontinence   Patient did sustain a left femoral neck fracture back in 2016.  She does use assistive devices for mobilization   Past Medical History:  Diagnosis Date  . Anemia   . Anxiety   . CKD (chronic kidney disease)   . Diverticulitis   . Dysphagia   . ESRD (end stage renal disease) ([place])    On HD  . Fracture of femoral neck, left, closed ([place]) 05/30/2015  . Gastritis 06/2013 & 05/2015  . Gastroesophageal reflux disease with stricture 05/2015  . GERD (gastroesophageal reflux disease)   . HTN (hypertension)   . Hyperparathyroidism due to renal insufficiency ([place])   . Hypertensive urgency 06/2013 & 04/2015  . Hypothyroidism   . ICH (intracerebral hemorrhage) ([place]) 10/2014  . Meningocele ([place])   . Protein calorie malnutrition ([place])   . Stroke ([place])   . Uterine cancer ([place]) 2012  . Vertical diplopia   . Vertigo     Past Surgical History:  Procedure Laterality Date  . ABDOMINAL AORTOGRAM N/A 04/05/2018   Procedure: ABDOMINAL AORTOGRAM;  Surgeon: Marty Heck, MD;  Location: [place] CV [facility];  Service: Cardiovascular;  Laterality: N/A;  . ABDOMINAL HYSTERECTOMY    . APPENDECTOMY    . BASCILIC VEIN TRANSPOSITION Right 09/17/2013   Procedure: BRACHIAL VEIN TRANSPOSITION;  Surgeon: Conrad Redgranite, MD;  Location: [place];  Service: Vascular;  Laterality: Right;  . BASCILIC VEIN TRANSPOSITION Right 10/29/2013   Procedure: RIGHT SECOND STAGE BRACHIAL VEIN TRANSPOSITION;  Surgeon: Conrad Prescott, MD;  Location: [place];  Service: Vascular;  Laterality: Right;  . CESAREAN SECTION    . CHOLECYSTECTOMY    . COLONOSCOPY  2000   TICS, IH  . COLONOSCOPY  2003 NUR BRBPR D50 V6   DC/[place] TICS, IH  . COLONOSCOPY N/A 09/04/2013   SLF: Small ulcer in the descending colon, one colon polyp (tubular adenoma), large internal hemorrhoids, moderate diverticulosis. next tcs 10 years.  . ESOPHAGOGASTRODUODENOSCOPY N/A 05/23/2015    HYI:FOYD non-erosive gastritis/stricture at the gastroesophageal junction  . ESOPHAGOGASTRODUODENOSCOPY (EGD) WITH ESOPHAGEAL DILATION N/A 06/08/2013   Dr. Fields:Stricture at the gastroesophageal junction/MILD NON-erosive gastritis (inflammation) was found in the gastric antrum; multiple biopsies/NO SOURCE FOR ANEMIA IDETIFIED-MOST LIKELY DUE TO ANEMIA OF CHRONIC DISEASE  . FLEXIBLE SIGMOIDOSCOPY N/A 01/02/2016   Dr. Oneida Alar: external hemorrhoids, diverticulosis, internal hemorrhoids, proximal rectum normal  . GIVENS CAPSULE STUDY N/A 12/03/2013   SLF: occasional gastric erosion. small bowel normal  . HIP PINNING,CANNULATED Left 05/31/2015   Procedure: CANNULATED HIP PINNING;  Surgeon: Leandrew Koyanagi, MD;  Location: [place];  Service: Orthopedics;  Laterality: Left;  . INSERTION OF DIALYSIS CATHETER Left 09/17/2013   Procedure: INSERTION OF DIALYSIS CATHETER;  Surgeon: Conrad Forsyth, MD;  Location: [place];  Service: Vascular;  Laterality: Left;  . LAPAROSCOPIC TOTAL HYSTERECTOMY    . LOWER EXTREMITY ANGIOGRAPHY Bilateral 04/05/2018   Procedure: Lower Extremity Angiography;  Surgeon: Marty Heck, MD;  Location: [place] [facility];  Service: Cardiovascular;  Laterality: Bilateral;  . NASAL SEPTUM SURGERY    . PERIPHERAL VASCULAR ATHERECTOMY Right 04/05/2018   Procedure: PERIPHERAL VASCULAR ATHERECTOMY;  Surgeon: Marty Heck, MD;  Location: [place] [facility];  Service: Cardiovascular;  Laterality: Right;  Peroneal   . PERIPHERAL VASCULAR BALLOON ANGIOPLASTY Right 04/05/2018   Procedure: PERIPHERAL VASCULAR BALLOON ANGIOPLASTY;  Surgeon: Marty Heck, MD;  Location: [place] [facility];  Service: Cardiovascular;  Laterality: Right;  Posterior tibial  . SAVORY DILATION N/A 05/23/2015   Procedure: SAVORY DILATION;  Surgeon: Danie Binder, MD;  [facility];  Service: Endoscopy;  Laterality: N/A;  . THROMBECTOMY AND REVISION OF ARTERIOVENTOUS (AV) GORETEX  GRAFT Right 07/06/2017    Procedure: THROMBECTOMY AND REVISION OF ARTERIOVENOUS (AV) GORETEX  GRAFT RIGHT UPPER ARM;  Surgeon: Rosetta Posner, MD;  Location: [facility];  Service: Vascular;  Laterality: Right;    Family History  Problem Relation Age of Onset  . Cancer Mother   . Diabetes Mother   . Hypertension Mother   . Diabetes Father   . Hypertension Father   . Hypertension Sister   . Colon cancer Neg Hx     Social History:  reports that she has never smoked. She has never used smokeless tobacco. She reports that she does not drink alcohol or use drugs.  Allergies:  Allergies  Allergen Reactions  . Cephalexin Itching    Medications: I have reviewed the patient's current medications.  Current Meds  Medication Sig  . allopurinol (ZYLOPRIM) 100 MG tablet Take 100 mg by mouth every Wednesday.   . ALPRAZolam (XANAX) 0.5 MG tablet Take 1 tablet (0.5 mg total) by mouth 2 (two) times daily as needed for anxiety. (Patient taking differently: Take 0.5 mg by mouth 2 (two) times daily. )  . aspirin EC 81 MG EC tablet Take 1 tablet (81 mg total) by mouth daily.  Marland Kitchen atorvastatin (LIPITOR) 10 MG tablet Take 10 mg by mouth daily.  Marland Kitchen  cinacalcet (SENSIPAR) 30 MG tablet Take 30 mg by mouth daily.  . clopidogrel (PLAVIX) 75 MG tablet Take 1 tablet (75 mg total) by mouth daily.  . diphenhydrAMINE (BENADRYL) 25 mg capsule Take 25 mg by mouth 2 (two) times daily.  . folic acid (FOLVITE) 1 MG tablet Take 1 mg by mouth 2 (two) times daily.   Marland Kitchen gabapentin (NEURONTIN) 100 MG capsule Take 100 mg by mouth at bedtime.  Marland Kitchen levothyroxine (SYNTHROID, LEVOTHROID) 50 MCG tablet Take 1 tablet (50 mcg total) by mouth daily. (Patient taking differently: Take 50 mcg by mouth daily before breakfast. )  . lidocaine-prilocaine (EMLA) cream Apply 1 application topically every Tuesday, Thursday, and Saturday at 6 PM.   . NIFEdipine (ADALAT CC) 90 MG 24 hr tablet Take 90 mg by mouth daily.   . pentoxifylline (TRENTAL) 400 MG CR tablet Take 400 mg by  mouth 3 (three) times daily with meals.  . Vitamin D, Ergocalciferol, (DRISDOL) 50000 units CAPS capsule Take 50,000 Units by mouth every Monday.      Results for orders placed or performed during the hospital encounter of 05/16/18 (from the past 48 hour(s))  Comprehensive metabolic panel     Status: Abnormal   Collection Time: 05/16/18  6:20 AM  Result Value Ref Range   Sodium 136 135 - 145 mmol/L   Potassium 3.4 (L) 3.5 - 5.1 mmol/L   Chloride 100 98 - 111 mmol/L   CO2 21 (L) 22 - 32 mmol/L   Glucose, Bld 111 (H) 70 - 99 mg/dL   BUN 37 (H) 8 - 23 mg/dL   Creatinine, Ser 9.38 (H) 0.44 - 1.00 mg/dL   Calcium 8.1 (L) 8.9 - 10.3 mg/dL   Total Protein 7.8 6.5 - 8.1 g/dL   Albumin 3.6 3.5 - 5.0 g/dL   AST 13 (L) 15 - 41 U/L   ALT 8 0 - 44 U/L   Alkaline Phosphatase 92 38 - 126 U/L   Total Bilirubin 0.7 0.3 - 1.2 mg/dL   GFR calc non Af Amer 4 (L) >60 mL/min   GFR calc Af Amer 4 (L) >60 mL/min    Comment: (NOTE) The eGFR has been calculated using the CKD EPI equation. This calculation has not been validated in all clinical situations. eGFR's persistently <60 mL/min signify possible Chronic Kidney Disease.    Anion gap 15 5 - 15    Comment: Performed at [facility], [address], [place], [place] [address]  CBC with Differential     Status: Abnormal   Collection Time: 05/16/18  6:20 AM  Result Value Ref Range   WBC 4.9 4.0 - 10.5 K/uL   RBC 4.48 3.87 - 5.11 MIL/uL   Hemoglobin 11.2 (L) 12.0 - 15.0 g/dL   HCT 37.5 36.0 - 46.0 %   MCV 83.7 80.0 - 100.0 fL   MCH 25.0 (L) 26.0 - 34.0 pg   MCHC 29.9 (L) 30.0 - 36.0 g/dL   RDW 16.8 (H) 11.5 - 15.5 %   Platelets 275 150 - 400 K/uL   nRBC 0.0 0.0 - 0.2 %   Neutrophils Relative % 73 %   Neutro Abs 3.5 1.7 - 7.7 K/uL   Lymphocytes Relative 18 %   Lymphs Abs 0.9 0.7 - 4.0 K/uL   Monocytes Relative 7 %   Monocytes Absolute 0.4 0.1 - 1.0 K/uL   Eosinophils Relative 1 %   Eosinophils Absolute 0.1 0.0 - 0.5 K/uL   Basophils  Relative 1 %  Basophils Absolute 0.0 0.0 - 0.1 K/uL   Immature Granulocytes 0 %   Abs Immature Granulocytes 0.01 0.00 - 0.07 K/uL    Comment: Performed at [facility], [address] [place], [place] [address]  Protime-INR     Status: None   Collection Time: 05/16/18  6:20 AM  Result Value Ref Range   Prothrombin Time 15.1 11.4 - 15.2 seconds   INR 1.20     Comment: Performed at [facility], [address] [place], [place] [address]  APTT     Status: None   Collection Time: 05/16/18  6:20 AM  Result Value Ref Range   aPTT 32 24 - 36 seconds    Comment: Performed at [facility], [address] [place], [place] [address]  Sample to Blood Bank     Status: None   Collection Time: 05/16/18  6:20 AM  Result Value Ref Range   Blood Bank Specimen SAMPLE AVAILABLE FOR TESTING    Sample Expiration      05/17/2018 Performed at [facility], [address], [place],  [address]     Dg Chest 1 View  Result Date: 05/16/2018 CLINICAL DATA:  Fall this morning. EXAM: CHEST  1 VIEW COMPARISON:  09/26/2017 FINDINGS: The heart size and mediastinal contours are within normal limits. Both lungs are clear. The visualized skeletal structures are unremarkable. Calcification of the aorta. IMPRESSION: No active disease. Electronically Signed   By: Lucienne Capers M.D.   On: 05/16/2018 06:10   Dg Knee Complete 4 Views Right  Result Date: 05/16/2018 CLINICAL DATA:  67 y/o F; fall from toilet with right posterior knee pain. EXAM: RIGHT KNEE - COMPLETE 4+ VIEW; DG HIP (WITH OR WITHOUT PELVIS) 2-3V RIGHT COMPARISON:  None. FINDINGS: Right hip: Pin fixation of the left femur neck. Hip joints, pubic symphysis, and sacroiliac joints are well maintained. No pelvic fracture or diastasis. No acute fracture of the right proximal femur identified. Vascular calcifications noted. Right knee: Acute comminuted 1/2 shaft's width displaced fracture of the distal femoral diaphysis within anterior and lateral apex  angulation. No knee joint dislocation. Osteoarthrosis of the knee joint with lateral greater than medial femorotibial compartment joint space narrowing and periarticular osteophytes. No joint effusion. Vascular calcifications noted. IMPRESSION: Acute comminuted 1/2 shaft's width displaced fracture of the right distal femoral diaphysis within anterior and lateral apex angulation. Electronically Signed   By: Kristine Garbe M.D.   On: 05/16/2018 06:10   Dg Hip Unilat W Or Wo Pelvis 2-3 Views Right  Result Date: 05/16/2018 CLINICAL DATA:  67 y/o F; fall from toilet with right posterior knee pain. EXAM: RIGHT KNEE - COMPLETE 4+ VIEW; DG HIP (WITH OR WITHOUT PELVIS) 2-3V RIGHT COMPARISON:  None. FINDINGS: Right hip: Pin fixation of the left femur neck. Hip joints, pubic symphysis, and sacroiliac joints are well maintained. No pelvic fracture or diastasis. No acute fracture of the right proximal femur identified. Vascular calcifications noted. Right knee: Acute comminuted 1/2 shaft's width displaced fracture of the distal femoral diaphysis within anterior and lateral apex angulation. No knee joint dislocation. Osteoarthrosis of the knee joint with lateral greater than medial femorotibial compartment joint space narrowing and periarticular osteophytes. No joint effusion. Vascular calcifications noted. IMPRESSION: Acute comminuted 1/2 shaft's width displaced fracture of the right distal femoral diaphysis within anterior and lateral apex angulation. Electronically Signed   By: Kristine Garbe M.D.   On: 05/16/2018 06:10    Review of Systems  Constitutional: Negative for chills and fever.  Cardiovascular: Negative for chest pain and palpitations.  Gastrointestinal: Negative for nausea and vomiting.   Blood pressure (!) 158/69, pulse 93, temperature 98.8 F (37.1 C), temperature source Oral, resp. rate 16, height '5\' 5"'$  (1.651 m), weight 61.2 kg, SpO2 100 %. Physical Exam  Constitutional:   Pleasant black female older than stated age, cooperative, no acute distress  Cardiovascular:  S1 and S2, systolic murmur  Pulmonary/Chest: No respiratory distress.  Decreased breath sounds bilaterally  Abdominal: Soft. There is no tenderness.  +BS  Musculoskeletal:  Pelvis--no traumatic wounds or rash, no ecchymosis, stable to manual stress, nontender  Right Lower Extremity Inspection: R leg is shortened, flexed at knee and internally rotated at hip and knee  No open wounds  Bony eval: TTP R distal femur Lower leg nontender Hip nontender  Soft tissue: No significant swelling noted  No open wounds  Sensation: DPN, SPN, TN sensation intact Motor: EHL, FHL, lesser toe motor intact Ankle flexion, extension, inv and ever intact Vascular:  + DP pulse noted  good color distally    Color symmetric to contra-lateral side   Psychiatric: She has a normal mood and affect. Her speech is normal and behavior is normal.     Assessment/Plan:  67 year old black female with complex medical history with acute right supracondylar distal femur fracture  -Right supracondylar distal femur fracture related to end-stage renal disease and metabolic bone disease  Plan for OR tomorrow afternoon for ORIF right distal femur  Will likely allow patient to be partial weightbearing postoperatively with unrestricted range of motion   With the assistance of nurse and [employer] we did apply a knee immobilizer to the right leg for comfort and to put the patient in a better position until surgery.  Patient tolerated this well.  She was given 2 mg of morphine IV.  She did have some discomfort with me pulling on her ankle complaining of ankle pain and not thigh pain.  Need to monitor this and x-ray ankle if pain persists.  Did not feel any crepitus or gross motion at her ankle.   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

## 2018-05-17 ENCOUNTER — Encounter (HOSPITAL_COMMUNITY): Payer: Self-pay | Admitting: Surgery

## 2018-05-17 ENCOUNTER — Inpatient Hospital Stay (HOSPITAL_COMMUNITY): Payer: Medicare Other

## 2018-05-17 ENCOUNTER — Inpatient Hospital Stay (HOSPITAL_COMMUNITY): Payer: Medicare Other | Admitting: Anesthesiology

## 2018-05-17 ENCOUNTER — Encounter (HOSPITAL_COMMUNITY)
Admission: EM | Disposition: A | Discharge status: Skilled Nursing Facility | Payer: Self-pay | Source: Home / Self Care | Attending: Internal Medicine

## 2018-05-17 DIAGNOSIS — S72411A Displaced unspecified condyle fracture of lower end of right femur, initial encounter for closed fracture: Secondary | ICD-10-CM

## 2018-05-17 DIAGNOSIS — Z992 Dependence on renal dialysis: Secondary | ICD-10-CM

## 2018-05-17 HISTORY — PX: ORIF FEMUR FRACTURE: SHX2119

## 2018-05-17 LAB — RENAL FUNCTION PANEL
ALBUMIN: 2.9 g/dL — AB (ref 3.5–5.0)
ANION GAP: 14 (ref 5–15)
ANION GAP: 17 — AB (ref 5–15)
Albumin: 2.9 g/dL — ABNORMAL LOW (ref 3.5–5.0)
BUN: 50 mg/dL — ABNORMAL HIGH (ref 8–23)
BUN: 19 mg/dL (ref 8–23)
CALCIUM: 7.5 mg/dL — AB (ref 8.9–10.3)
CHLORIDE: 102 mmol/L (ref 98–111)
CO2: 22 mmol/L (ref 22–32)
CO2: 21 mmol/L — ABNORMAL LOW (ref 22–32)
CREATININE: 11.17 mg/dL — AB (ref 0.44–1.00)
Calcium: 7.8 mg/dL — ABNORMAL LOW (ref 8.9–10.3)
Chloride: 101 mmol/L (ref 98–111)
Creatinine, Ser: 5.43 mg/dL — ABNORMAL HIGH (ref 0.44–1.00)
GFR calc Af Amer: 8 mL/min — ABNORMAL LOW (ref >60)
GFR calc non Af Amer: 7 mL/min — ABNORMAL LOW (ref >60)
GFR, EST AFRICAN AMERICAN: 4 mL/min — AB (ref >60)
GFR, EST NON AFRICAN AMERICAN: 3 mL/min — AB (ref >60)
GLUCOSE: 106 mg/dL — AB (ref 70–99)
Glucose, Bld: 91 mg/dL (ref 70–99)
PHOSPHORUS: 5.6 mg/dL — AB (ref 2.5–4.6)
PHOSPHORUS: 5.6 mg/dL — AB (ref 2.5–4.6)
POTASSIUM: 4.6 mmol/L (ref 3.5–5.1)
Potassium: 4.7 mmol/L (ref 3.5–5.1)
SODIUM: 137 mmol/L (ref 135–145)
Sodium: 140 mmol/L (ref 135–145)

## 2018-05-17 LAB — PROTIME-INR
INR: 1.32
Prothrombin Time: 16.2 seconds — ABNORMAL HIGH (ref 11.4–15.2)

## 2018-05-17 LAB — CBC
HEMATOCRIT: 34.3 % — AB (ref 36.0–46.0)
HEMOGLOBIN: 10.1 g/dL — AB (ref 12.0–15.0)
MCH: 24.9 pg — ABNORMAL LOW (ref 26.0–34.0)
MCHC: 29.4 g/dL — ABNORMAL LOW (ref 30.0–36.0)
MCV: 84.5 fL (ref 80.0–100.0)
NRBC: 0 % (ref 0.0–0.2)
Platelets: 272 10*3/uL (ref 150–400)
RBC: 4.06 MIL/uL (ref 3.87–5.11)
RDW: 16.4 % — ABNORMAL HIGH (ref 11.5–15.5)
WBC: 5.8 10*3/uL (ref 4.0–10.5)

## 2018-05-17 LAB — HEPATITIS B SURFACE ANTIGEN: Hepatitis B Surface Ag: NEGATIVE

## 2018-05-17 SURGERY — OPEN REDUCTION INTERNAL FIXATION (ORIF) DISTAL FEMUR FRACTURE
Anesthesia: General | Site: Leg Upper | Laterality: Right

## 2018-05-17 MED ORDER — VANCOMYCIN HCL 1000 MG IV SOLR
INTRAVENOUS | Status: DC | PRN
  Administered 2018-05-17: 1000 mg via TOPICAL

## 2018-05-17 MED ORDER — FENTANYL CITRATE (PF) 100 MCG/2ML IJ SOLN
INTRAMUSCULAR | Status: DC | PRN
  Administered 2018-05-17: 100 ug via INTRAVENOUS

## 2018-05-17 MED ORDER — ROCURONIUM BROMIDE 50 MG/5ML IV SOSY
PREFILLED_SYRINGE | INTRAVENOUS | Status: AC
  Filled 2018-05-17: qty 20

## 2018-05-17 MED ORDER — BACITRACIN ZINC 500 UNIT/GM EX OINT
TOPICAL_OINTMENT | CUTANEOUS | Status: AC
  Filled 2018-05-17: qty 28.35

## 2018-05-17 MED ORDER — PENTAFLUOROPROP-TETRAFLUOROETH EX AERO: 1.0000 "application " | INHALATION_SPRAY | CUTANEOUS | Status: DC | PRN

## 2018-05-17 MED ORDER — FENTANYL CITRATE (PF) 100 MCG/2ML IJ SOLN: 25.0000 ug | INTRAMUSCULAR | Status: DC | PRN

## 2018-05-17 MED ORDER — ROCURONIUM BROMIDE 50 MG/5ML IV SOSY
PREFILLED_SYRINGE | INTRAVENOUS | Status: DC | PRN
  Administered 2018-05-17: 20 mg via INTRAVENOUS
  Administered 2018-05-17: 30 mg via INTRAVENOUS

## 2018-05-17 MED ORDER — RENA-VITE PO TABS
1.0000 | ORAL_TABLET | Freq: Every day | ORAL | Status: DC
  Administered 2018-05-17 – 2018-05-23 (×6): 1 via ORAL
  Filled 2018-05-17 (×6): qty 1

## 2018-05-17 MED ORDER — ROCURONIUM BROMIDE 50 MG/5ML IV SOSY
PREFILLED_SYRINGE | INTRAVENOUS | Status: AC
  Filled 2018-05-17: qty 5

## 2018-05-17 MED ORDER — PHENYLEPHRINE 40 MCG/ML (10ML) SYRINGE FOR IV PUSH (FOR BLOOD PRESSURE SUPPORT)
PREFILLED_SYRINGE | INTRAVENOUS | Status: AC
  Filled 2018-05-17: qty 20

## 2018-05-17 MED ORDER — LIDOCAINE 2% (20 MG/ML) 5 ML SYRINGE
INTRAMUSCULAR | Status: DC | PRN
  Administered 2018-05-17: 40 mg via INTRAVENOUS

## 2018-05-17 MED ORDER — 0.9 % SODIUM CHLORIDE (POUR BTL) OPTIME
TOPICAL | Status: DC | PRN
  Administered 2018-05-17: 1000 mL

## 2018-05-17 MED ORDER — NIFEDIPINE ER OSMOTIC RELEASE 90 MG PO TB24
90.0000 mg | ORAL_TABLET | Freq: Every day | ORAL | Status: DC
  Administered 2018-05-17: 90 mg via ORAL
  Filled 2018-05-17: qty 1

## 2018-05-17 MED ORDER — SODIUM CHLORIDE 0.9 % IV SOLN
INTRAVENOUS | Status: DC | PRN
  Administered 2018-05-17: 50 ug/min via INTRAVENOUS

## 2018-05-17 MED ORDER — NEOSTIGMINE METHYLSULFATE 10 MG/10ML IV SOLN
INTRAVENOUS | Status: DC | PRN
  Administered 2018-05-17: 3 mg via INTRAVENOUS

## 2018-05-17 MED ORDER — VANCOMYCIN HCL 1000 MG IV SOLR
INTRAVENOUS | Status: AC
  Filled 2018-05-17: qty 1000

## 2018-05-17 MED ORDER — SODIUM CHLORIDE 0.9 % IV SOLN: 100.0000 mL | INTRAVENOUS | Status: DC | PRN

## 2018-05-17 MED ORDER — LIDOCAINE HCL (PF) 1 % IJ SOLN: 5.0000 mL | INTRAMUSCULAR | Status: DC | PRN

## 2018-05-17 MED ORDER — SODIUM CHLORIDE 0.9 % IV SOLN
INTRAVENOUS | Status: DC | PRN
  Administered 2018-05-17: 14:00:00 via INTRAVENOUS

## 2018-05-17 MED ORDER — CEFAZOLIN SODIUM-DEXTROSE 2-4 GM/100ML-% IV SOLN: 2.0000 g | Freq: Three times a day (TID) | INTRAVENOUS | Status: DC

## 2018-05-17 MED ORDER — PROPOFOL 10 MG/ML IV BOLUS
INTRAVENOUS | Status: AC
  Filled 2018-05-17: qty 40

## 2018-05-17 MED ORDER — ONDANSETRON HCL 4 MG/2ML IJ SOLN: 4.0000 mg | Freq: Once | INTRAMUSCULAR | Status: DC | PRN

## 2018-05-17 MED ORDER — MEPERIDINE HCL 50 MG/ML IJ SOLN: 6.2500 mg | INTRAMUSCULAR | Status: DC | PRN

## 2018-05-17 MED ORDER — HEPARIN SODIUM (PORCINE) 1000 UNIT/ML DIALYSIS: 1000.0000 [IU] | INTRAMUSCULAR | Status: DC | PRN

## 2018-05-17 MED ORDER — ALTEPLASE 2 MG IJ SOLR: 2.0000 mg | Freq: Once | INTRAMUSCULAR | Status: DC | PRN

## 2018-05-17 MED ORDER — FENTANYL CITRATE (PF) 250 MCG/5ML IJ SOLN
INTRAMUSCULAR | Status: AC
  Filled 2018-05-17: qty 5

## 2018-05-17 MED ORDER — BACITRACIN 500 UNIT/GM EX OINT: TOPICAL_OINTMENT | CUTANEOUS | Status: DC | PRN

## 2018-05-17 MED ORDER — LIDOCAINE-PRILOCAINE 2.5-2.5 % EX CREA: 1.0000 "application " | TOPICAL_CREAM | CUTANEOUS | Status: DC | PRN

## 2018-05-17 MED ORDER — HEPARIN SODIUM (PORCINE) 1000 UNIT/ML DIALYSIS: 40.0000 [IU]/kg | Freq: Once | INTRAMUSCULAR | Status: DC

## 2018-05-17 MED ORDER — PROPOFOL 10 MG/ML IV BOLUS
INTRAVENOUS | Status: DC | PRN
  Administered 2018-05-17: 80 mg via INTRAVENOUS

## 2018-05-17 MED ORDER — DEXAMETHASONE SODIUM PHOSPHATE 4 MG/ML IJ SOLN
INTRAMUSCULAR | Status: DC | PRN
  Administered 2018-05-17: 5 mg via INTRAVENOUS

## 2018-05-17 MED ORDER — LIDOCAINE 2% (20 MG/ML) 5 ML SYRINGE
INTRAMUSCULAR | Status: AC
  Filled 2018-05-17: qty 5

## 2018-05-17 MED ORDER — CEFAZOLIN SODIUM-DEXTROSE 2-3 GM-%(50ML) IV SOLR
INTRAVENOUS | Status: DC | PRN
  Administered 2018-05-17: 2 g via INTRAVENOUS

## 2018-05-17 MED ORDER — GLYCOPYRROLATE 0.2 MG/ML IJ SOLN
INTRAMUSCULAR | Status: DC | PRN
  Administered 2018-05-17: 0.4 mg via INTRAVENOUS

## 2018-05-17 SURGICAL SUPPLY — 66 items
BIT DRILL 3.3 LONG (BIT) ×2 IMPLANT
BIT DRILL 4.3 (BIT) ×2
BIT DRILL 4.3MM (BIT) ×1
BIT DRILL 4.3X300MM (BIT) IMPLANT
BIT DRILL QC 3.3X195 (BIT) ×2 IMPLANT
BLADE CLIPPER SURG (BLADE) IMPLANT
BNDG CMPR MED 10X6 ELC LF (GAUZE/BANDAGES/DRESSINGS) ×1
BNDG COHESIVE 6X5 TAN STRL LF (GAUZE/BANDAGES/DRESSINGS) ×1 IMPLANT
BNDG ELASTIC 6X10 VLCR STRL LF (GAUZE/BANDAGES/DRESSINGS) ×3 IMPLANT
BRUSH SCRUB SURG 4.25 DISP (MISCELLANEOUS) ×4 IMPLANT
CANISTER SUCT 3000ML PPV (MISCELLANEOUS) ×3 IMPLANT
CAP LOCK NCB (Cap) ×16 IMPLANT
CHLORAPREP W/TINT 26ML (MISCELLANEOUS) ×3 IMPLANT
COVER SURGICAL LIGHT HANDLE (MISCELLANEOUS) ×3 IMPLANT
COVER WAND RF STERILE (DRAPES) ×3 IMPLANT
DRAPE C-ARM 42X72 X-RAY (DRAPES) ×3 IMPLANT
DRAPE C-ARMOR (DRAPES) ×3 IMPLANT
DRAPE ORTHO SPLIT 77X108 STRL (DRAPES) ×6
DRAPE PROXIMA HALF (DRAPES) ×6 IMPLANT
DRAPE SURG 17X23 STRL (DRAPES) ×3 IMPLANT
DRAPE SURG ORHT 6 SPLT 77X108 (DRAPES) ×2 IMPLANT
DRAPE U-SHAPE 47X51 STRL (DRAPES) ×3 IMPLANT
DRSG ADAPTIC 3X8 NADH LF (GAUZE/BANDAGES/DRESSINGS) ×2 IMPLANT
DRSG MEPILEX BORDER 4X12 (GAUZE/BANDAGES/DRESSINGS) IMPLANT
DRSG MEPILEX BORDER 4X4 (GAUZE/BANDAGES/DRESSINGS) IMPLANT
DRSG MEPILEX BORDER 4X8 (GAUZE/BANDAGES/DRESSINGS) ×4 IMPLANT
DRSG PAD ABDOMINAL 8X10 ST (GAUZE/BANDAGES/DRESSINGS) ×9 IMPLANT
ELECT REM PT RETURN 9FT ADLT (ELECTROSURGICAL) ×3
ELECTRODE REM PT RTRN 9FT ADLT (ELECTROSURGICAL) ×1 IMPLANT
GAUZE SPONGE 4X4 12PLY STRL (GAUZE/BANDAGES/DRESSINGS) ×3 IMPLANT
GLOVE BIO SURGEON STRL SZ7.5 (GLOVE) ×12 IMPLANT
GLOVE BIOGEL PI IND STRL 7.5 (GLOVE) ×1 IMPLANT
GLOVE BIOGEL PI INDICATOR 7.5 (GLOVE) ×2
GOWN STRL REUS W/ TWL LRG LVL3 (GOWN DISPOSABLE) ×3 IMPLANT
GOWN STRL REUS W/TWL LRG LVL3 (GOWN DISPOSABLE) ×9
K-WIRE 2.0 (WIRE) ×3
K-WIRE FXSTD 280X2XNS SS (WIRE) ×1
KIT BASIN OR (CUSTOM PROCEDURE TRAY) ×3 IMPLANT
KIT TURNOVER KIT B (KITS) ×3 IMPLANT
KWIRE FXSTD 280X2XNS SS (WIRE) IMPLANT
NS IRRIG 1000ML POUR BTL (IV SOLUTION) ×3 IMPLANT
PACK TOTAL JOINT (CUSTOM PROCEDURE TRAY) ×3 IMPLANT
PAD ARMBOARD 7.5X6 YLW CONV (MISCELLANEOUS) ×6 IMPLANT
PAD CAST 4YDX4 CTTN HI CHSV (CAST SUPPLIES) ×1 IMPLANT
PADDING CAST COTTON 4X4 STRL (CAST SUPPLIES) ×3
PADDING CAST COTTON 6X4 STRL (CAST SUPPLIES) ×3 IMPLANT
PLATE DISTAL FEMUR 15H 317M RT (Plate) ×2 IMPLANT
SCREW 5.0 80MM (Screw) ×8 IMPLANT
SCREW NCB 3.5X75X5X6.2XST (Screw) IMPLANT
SCREW NCB 4.0MX34M (Screw) ×2 IMPLANT
SCREW NCB 5.0X36MM (Screw) ×4 IMPLANT
SCREW NCB 5.0X75MM (Screw) ×6 IMPLANT
SPONGE LAP 18X18 X RAY DECT (DISPOSABLE) IMPLANT
STAPLER VISISTAT 35W (STAPLE) ×3 IMPLANT
SUCTION FRAZIER HANDLE 10FR (MISCELLANEOUS) ×2
SUCTION TUBE FRAZIER 10FR DISP (MISCELLANEOUS) ×1 IMPLANT
SUT ETHILON 3 0 PS 1 (SUTURE) ×6 IMPLANT
SUT VIC AB 0 CT1 27 (SUTURE)
SUT VIC AB 0 CT1 27XBRD ANBCTR (SUTURE) IMPLANT
SUT VIC AB 1 CT1 27 (SUTURE)
SUT VIC AB 1 CT1 27XBRD ANBCTR (SUTURE) IMPLANT
SUT VIC AB 2-0 CT1 27 (SUTURE) ×6
SUT VIC AB 2-0 CT1 TAPERPNT 27 (SUTURE) ×2 IMPLANT
TOWEL OR 17X26 10 PK STRL BLUE (TOWEL DISPOSABLE) ×6 IMPLANT
TRAY FOLEY MTR SLVR 16FR STAT (SET/KITS/TRAYS/PACK) IMPLANT
WATER STERILE IRR 1000ML POUR (IV SOLUTION) ×6 IMPLANT

## 2018-05-17 NOTE — Anesthesia Procedure Notes (Signed)
Procedure Name: Intubation Date/Time: 05/17/2018 2:36 PM Performed by: Oletta Lamas, CRNA Pre-anesthesia Checklist: Patient identified, Emergency Drugs available, Suction available and Patient being monitored Patient Re-evaluated:Patient Re-evaluated prior to induction Oxygen Delivery Method: Circle System Utilized Preoxygenation: Pre-oxygenation with 100% oxygen Induction Type: IV induction Ventilation: Mask ventilation without difficulty Laryngoscope Size: Miller and 2 Grade View: Grade I Tube type: Oral Tube size: 7.0 mm Number of attempts: 1 Airway Equipment and Method: Stylet and Oral airway Placement Confirmation: ETT inserted through vocal cords under direct vision,  positive ETCO2 and breath sounds checked- equal and bilateral Secured at: 21 cm Tube secured with: Tape Dental Injury: Teeth and Oropharynx as per pre-operative assessment

## 2018-05-17 NOTE — Plan of Care (Signed)

## 2018-05-17 NOTE — Progress Notes (Signed)
Subjective: Interval History: has no complaint , thinks things will go well today.  Objective: Vital signs in last 24 hours: Temp:  [98.1 F (36.7 C)-98.8 F (37.1 C)] 98.2 F (36.8 C) (10/16 0745) Pulse Rate:  [82-93] 82 (10/16 0800) Resp:  [11-20] 18 (10/16 0745) BP: (128-158)/(63-85) 143/73 (10/16 0800) SpO2:  [99 %-100 %] 99 % (10/16 0745) Weight:  [66.4 kg] 66.4 kg (10/16 0745) Weight change:   Intake/Output from previous day: No intake/output data recorded. Intake/Output this shift: No intake/output data recorded.  General appearance: alert, cooperative and no distress Resp: clear to auscultation bilaterally Cardio: regular rate and rhythm, S1, S2 normal and systolic murmur: systolic ejection 2/6, crescendo and decrescendo at 2nd left intercostal space, cont M , goes down with comp of AVG GI: soft, liver down 5 cm, pos bs Extremities: AVG RUA, R leg rotated in  Lab Results: Recent Labs    05/16/18 0620 05/17/18 0320  WBC 4.9 5.8  HGB 11.2* 10.1*  HCT 37.5 34.3*  PLT 275 272   BMET:  Recent Labs    05/16/18 0620 05/16/18 1702  NA 136 137  K 3.4* 4.5  CL 100 101  CO2 21* 24  GLUCOSE 111* 101*  BUN 37* 42*  CREATININE 9.38* 10.30*  CALCIUM 8.1* 8.1*   No results for input(s): PTH in the last 72 hours. Iron Studies: No results for input(s): IRON, TIBC, TRANSFERRIN, FERRITIN in the last 72 hours.  Studies/Results: Dg Chest 1 View  Result Date: 05/16/2018 CLINICAL DATA:  Fall this morning. EXAM: CHEST  1 VIEW COMPARISON:  09/26/2017 FINDINGS: The heart size and mediastinal contours are within normal limits. Both lungs are clear. The visualized skeletal structures are unremarkable. Calcification of the aorta. IMPRESSION: No active disease. Electronically Signed   By: Lucienne Capers M.D.   On: 05/16/2018 06:10   Dg Knee Complete 4 Views Right  Result Date: 05/16/2018 CLINICAL DATA:  67 y/o F; fall from toilet with right posterior knee pain. EXAM: RIGHT  KNEE - COMPLETE 4+ VIEW; DG HIP (WITH OR WITHOUT PELVIS) 2-3V RIGHT COMPARISON:  None. FINDINGS: Right hip: Pin fixation of the left femur neck. Hip joints, pubic symphysis, and sacroiliac joints are well maintained. No pelvic fracture or diastasis. No acute fracture of the right proximal femur identified. Vascular calcifications noted. Right knee: Acute comminuted 1/2 shaft's width displaced fracture of the distal femoral diaphysis within anterior and lateral apex angulation. No knee joint dislocation. Osteoarthrosis of the knee joint with lateral greater than medial femorotibial compartment joint space narrowing and periarticular osteophytes. No joint effusion. Vascular calcifications noted. IMPRESSION: Acute comminuted 1/2 shaft's width displaced fracture of the right distal femoral diaphysis within anterior and lateral apex angulation. Electronically Signed   By: Kristine Garbe M.D.   On: 05/16/2018 06:10   Dg Hip Unilat W Or Wo Pelvis 2-3 Views Right  Result Date: 05/16/2018 CLINICAL DATA:  68 y/o F; fall from toilet with right posterior knee pain. EXAM: RIGHT KNEE - COMPLETE 4+ VIEW; DG HIP (WITH OR WITHOUT PELVIS) 2-3V RIGHT COMPARISON:  None. FINDINGS: Right hip: Pin fixation of the left femur neck. Hip joints, pubic symphysis, and sacroiliac joints are well maintained. No pelvic fracture or diastasis. No acute fracture of the right proximal femur identified. Vascular calcifications noted. Right knee: Acute comminuted 1/2 shaft's width displaced fracture of the distal femoral diaphysis within anterior and lateral apex angulation. No knee joint dislocation. Osteoarthrosis of the knee joint with lateral greater than medial femorotibial  compartment joint space narrowing and periarticular osteophytes. No joint effusion. Vascular calcifications noted. IMPRESSION: Acute comminuted 1/2 shaft's width displaced fracture of the right distal femoral diaphysis within anterior and lateral apex angulation.  Electronically Signed   By: Kristine Garbe M.D.   On: 05/16/2018 06:10    I have reviewed the patient's current medications.  Assessment/Plan: 1 ESRD for HD 2 Anemia lower , will use esa 3 HTN 4 HPTH review meds 5 Hip fx. Per ortho 6 Hx ICB 7 Divertic dz P HD, OR for hip, esa, follow labs    LOS: 1 day   Sonya [name] 05/17/2018,8:28 AM

## 2018-05-17 NOTE — Transfer of Care (Signed)
Immediate Anesthesia Transfer of Care Note  Patient: Sonya [name]  Procedure(s) Performed: OPEN REDUCTION INTERNAL FIXATION (ORIF) DISTAL FEMUR FRACTURE (Right Leg Upper)  Patient Location: PACU  Anesthesia Type:General  Level of Consciousness: awake, alert , oriented and patient cooperative  Airway & Oxygen Therapy: Patient Spontanous Breathing  Post-op Assessment: Report given to RN and Post -op Vital signs reviewed and stable  Post vital signs: Reviewed and stable  Last Vitals:  Vitals Value Taken Time  BP 100/50 05/17/2018  4:37 PM  Temp    Pulse 93 05/17/2018  4:42 PM  Resp 19 05/17/2018  4:42 PM  SpO2 100 % 05/17/2018  4:42 PM  Vitals shown include unvalidated device data.  Last Pain:  Vitals:   05/17/18 1229  TempSrc: Oral  PainSc: 5       Patients Stated Pain Goal: 1 (09/24/34 1224)  Complications: No apparent anesthesia complications

## 2018-05-17 NOTE — Progress Notes (Signed)
Initial Nutrition Assessment  DOCUMENTATION CODES:   Not applicable  INTERVENTION:    Add PO supplements as needed when diet is advanced after procedure.  NUTRITION DIAGNOSIS:   Inadequate oral intake related to inability to eat as evidenced by NPO status.  GOAL:   Patient will meet greater than or equal to 90% of their needs  MONITOR:   Diet advancement, PO intake, Labs, I & O's  REASON FOR ASSESSMENT:   Consult Hip fracture protocol  ASSESSMENT:   67 yo female with PMH of HTN, hypothyroidism, uterine cancer, anemia, CKD, dysphagia, GERD with stricture, ESRD on HD, and malnutrition who was admitted on 10/15 with R hip fx.   Currently in the OR for ORIF. Unable to speak with patient or complete nutrition focused physical exam. Hx of malnutrition. Labs reviewed. Phosphorus 5.6 (H) Medications reviewed and include Rena-vit. Per review of weight encounters, weight fluctuates, likely related to fluid status. Unknown EDW.  NUTRITION - FOCUSED PHYSICAL EXAM:  unable to complete NFPE at this time  Diet Order:   Diet Order            Diet NPO time specified  Diet effective midnight              EDUCATION NEEDS:   No education needs have been identified at this time  Skin:  Skin Assessment: Reviewed RN Assessment  Last BM:  10/15  Height:   Ht Readings from Last 1 Encounters:  05/16/18 5\' 5"  (1.651 m)    Weight:   Wt Readings from Last 1 Encounters:  05/17/18 66.4 kg    Ideal Body Weight:  56.8 kg  BMI:  Body mass index is 24.36 kg/m.  Estimated Nutritional Needs:   Kcal:  1950-2150  Protein:  90-100 gm  Fluid:  UOP + 1 L    Molli Barrows, RD, LDN, [employer] Pager [phone] After Hours Pager [phone]

## 2018-05-17 NOTE — Progress Notes (Signed)
Returns from dialysis

## 2018-05-17 NOTE — Progress Notes (Signed)
PROGRESS NOTE  Sonya [name] JKK:[id number] DOB: [birth year] DOA: 05/16/2018 PCP: Redmond School, MD  HPI/Recap of past 24 hours: Sonya [name] is a 67 y.o. female with medical history of anemia of CKD, anxiety, ESRD on hemodialysis Tuesday/Thursday/Saturday, diverticulosis/diverticulitis, history of dysphagia, history of fracture of left femoral neck, GERD, hypertension, hyperparathyroidism due to renal insufficiency, hypothyroidism, intracerebral hemorrhage, meningocele, protein calorie malnutrition, ischemic stroke, history of uterine cancer presenting after mechanical fall going to the bathroom.  The patient states that she was using her walker.  She was trying to get up off the commode when her right leg "gave out", and the patient fell onto her right side.  The patient had extreme pain and was unable to bear any weight.  EMS was activated.  In the emergency department, x-rays of the hip revealed an acute comminuted fracture of the right femoral diaphysis with half shaft width displacement. Pt admitted for further management.   Today, saw pt post op. Denies any new complaints, just reported she feels cold. Post op pain well controlled.  Assessment/Plan: Active Problems:   Essential hypertension   Hyperparathyroidism due to renal insufficiency (HCC)   Hypothyroidism, adult   Hyperlipidemia   End stage renal failure on dialysis [facility])   Fracture of condyle of right femur, closed, initial encounter ([place])   Closed fracture of right femur, unspecified fracture morphology, initial encounter ([place])  Acute comminuted fracture of the right femur s/p ORIF on 05/17/18 Orthopedics on board Pain management and DVT ppx as per ortho PT eval after surgery  ESRD Nephrology on board HD as per nephrology  Secondary hyperparathyroidism Management per nephrology  Essential hypertension Continue nifedipine  Anemia of CKD Baseline hemoglobin 11-12 Hemoglobin at baseline  History of  ischemic stroke Continue ASA Holding plavix due to recent surgery Continue statin  Hyperlipidemia Continue statin--increased atorvastatin to40 mg daily  Hypothyroidism Continue synthroid    Code Status: Full   Family Communication: Discussed with daughter and other family members at bedside  Disposition Plan: TBD    Consultants:  Orthopedics  Procedures:  ORIF on 05/17/18  Antimicrobials:  None  DVT prophylaxis: Heparin   Objective: Vitals:   05/17/18 1725 05/17/18 1730 05/17/18 1740 05/17/18 1817  BP: (!) 137/58  (!) 143/65   Pulse: 85 87 90 88  Resp: 17 18 18 16   Temp:    97.8 F (36.6 C)  TempSrc:    Oral  SpO2: 100% 100% 96% 96%  Weight:      Height:        Intake/Output Summary (Last 24 hours) at 05/17/2018 1854 Last data filed at 05/17/2018 1547 Gross per 24 hour  Intake -  Output 1772 ml  Net -1772 ml   Filed Weights   05/16/18 0505 05/17/18 0745 05/17/18 1153  Weight: 61.2 kg 66.4 kg 64.4 kg    Exam:   General: NAD   Cardiovascular: S1, S2 present  Respiratory: CTAB  Abdomen: Soft, NT, ND, BS present  Musculoskeletal: RLE wrapped in ACE c/d/i, LLE no pedal edema noted  Skin: Normal  Psychiatry: Normal mood   Data Reviewed: CBC: Recent Labs  Lab 05/16/18 0620 05/17/18 0320  WBC 4.9 5.8  NEUTROABS 3.5  --   HGB 11.2* 10.1*  HCT 37.5 34.3*  MCV 83.7 84.5  PLT 275 716   Basic Metabolic Panel: Recent Labs  Lab 05/16/18 0620 05/16/18 1702 05/17/18 0740  NA 136 137 137  K 3.4* 4.5 4.7  CL 100 101 101  CO2  21* 24 22  GLUCOSE 111* 101* 91  BUN 37* 42* 50*  CREATININE 9.38* 10.30* 11.17*  CALCIUM 8.1* 8.1* 7.5*  PHOS  --  4.6 5.6*   GFR: Estimated Creatinine Clearance: 4.3 mL/min (A) (by C-G formula based on SCr of 11.17 mg/dL (H)). Liver Function Tests: Recent Labs  Lab 05/16/18 0620 05/16/18 1702 05/17/18 0740  AST 13*  --   --   ALT 8  --   --   ALKPHOS 92  --   --   BILITOT 0.7  --   --     PROT 7.8  --   --   ALBUMIN 3.6 3.2* 2.9*   No results for input(s): LIPASE, AMYLASE in the last 168 hours. No results for input(s): AMMONIA in the last 168 hours. Coagulation Profile: Recent Labs  Lab 05/16/18 0620 05/17/18 0320  INR 1.20 1.32   Cardiac Enzymes: No results for input(s): CKTOTAL, CKMB, CKMBINDEX, TROPONINI in the last 168 hours. BNP (last 3 results) No results for input(s): PROBNP in the last 8760 hours. HbA1C: No results for input(s): HGBA1C in the last 72 hours. CBG: No results for input(s): GLUCAP in the last 168 hours. Lipid Profile: No results for input(s): CHOL, HDL, LDLCALC, TRIG, CHOLHDL, LDLDIRECT in the last 72 hours. Thyroid Function Tests: No results for input(s): TSH, T4TOTAL, FREET4, T3FREE, THYROIDAB in the last 72 hours. Anemia Panel: No results for input(s): VITAMINB12, FOLATE, FERRITIN, TIBC, IRON, RETICCTPCT in the last 72 hours. Urine analysis:    Component Value Date/Time   COLORURINE YELLOW 04/14/2015 1427   APPEARANCEUR CLEAR 04/14/2015 1427   LABSPEC 1.020 04/14/2015 1427   PHURINE 8.5 (H) 04/14/2015 1427   GLUCOSEU 100 (A) 04/14/2015 1427   HGBUR SMALL (A) 04/14/2015 1427   BILIRUBINUR NEGATIVE 04/14/2015 1427   KETONESUR NEGATIVE 04/14/2015 1427   PROTEINUR >300 (A) 04/14/2015 1427   UROBILINOGEN 0.2 04/14/2015 1427   NITRITE NEGATIVE 04/14/2015 1427   LEUKOCYTESUR TRACE (A) 04/14/2015 1427   Sepsis Labs: @LABRCNTIP (procalcitonin:4,lacticidven:4)  ) Recent Results (from the past 240 hour(s))  Surgical PCR screen     Status: None   Collection Time: 05/16/18  6:11 PM  Result Value Ref Range Status   MRSA, PCR NEGATIVE NEGATIVE Final   Staphylococcus aureus NEGATIVE NEGATIVE Final    Comment: (NOTE) The Xpert SA Assay (FDA approved for NASAL specimens in patients 70 years of age and older), is one component of a comprehensive surveillance program. It is not intended to diagnose infection nor to guide or monitor  treatment. Performed at [facility], [place] [address] [place],  [address]       Studies: Dg C-arm 1-60 Min  Result Date: 05/17/2018 CLINICAL DATA:  ORIF right femur fracture EXAM: DG C-ARM 61-120 MIN; RIGHT FEMUR 2 VIEWS FLUOROSCOPY TIME:  1 minutes, 44 seconds COMPARISON:  Right femur radiographs-05/16/2018 FINDINGS: Seven spot intraoperative fluoroscopic images the distal aspect the right femur are provided for review Initial images demonstrate known comminuted displaced distal femur fracture. Subsequent images demonstrate sideplate fixation of the femur fracture with multiple transfixing cancellous screws. Improved alignment of the fracture fragments with minimal residual displacement. Expected adjacent subcutaneous emphysema. No radiopaque foreign body. IMPRESSION: Post sideplate fixation of comminuted distal femur fracture without evidence of complication. Electronically Signed   By: Sandi Mariscal M.D.   On: 05/17/2018 15:57   Dg Femur, Min 2 Views Right  Result Date: 05/17/2018 CLINICAL DATA:  ORIF right femur fracture EXAM: DG C-ARM 61-120 MIN;  RIGHT FEMUR 2 VIEWS FLUOROSCOPY TIME:  1 minutes, 44 seconds COMPARISON:  Right femur radiographs-05/16/2018 FINDINGS: Seven spot intraoperative fluoroscopic images the distal aspect the right femur are provided for review Initial images demonstrate known comminuted displaced distal femur fracture. Subsequent images demonstrate sideplate fixation of the femur fracture with multiple transfixing cancellous screws. Improved alignment of the fracture fragments with minimal residual displacement. Expected adjacent subcutaneous emphysema. No radiopaque foreign body. IMPRESSION: Post sideplate fixation of comminuted distal femur fracture without evidence of complication. Electronically Signed   By: Sandi Mariscal M.D.   On: 05/17/2018 15:57    Scheduled Meds: . allopurinol  100 mg Oral Q Wed  . aspirin EC  81 mg Oral Daily  . atorvastatin  10 mg  Oral q1800  . Chlorhexidine Gluconate Cloth  6 each Topical Q0600  . gabapentin  100 mg Oral QHS  . heparin  5,000 Units Subcutaneous Q8H  . levothyroxine  50 mcg Oral QAC breakfast  . lidocaine-prilocaine  1 application Topical Q S,RP,[id number]  . multivitamin  1 tablet Oral QHS  . NIFEdipine  90 mg Oral QHS  . pentoxifylline  400 mg Oral TID WC    Continuous Infusions:   LOS: 1 day     Alma Friendly, MD [facility]   If 7PM-7AM, please contact night-coverage [email or web address] 05/17/2018, 6:54 PM

## 2018-05-17 NOTE — Anesthesia Preprocedure Evaluation (Signed)
Anesthesia Evaluation  Patient identified by MRN, date of birth, ID band Patient awake    Reviewed: Allergy & Precautions, NPO status , Patient's Chart, lab work & pertinent test results  Airway Mallampati: I       Dental  (+) Edentulous Lower, Edentulous Upper   Pulmonary    Pulmonary exam normal breath sounds clear to auscultation       Cardiovascular hypertension, Pt. on medications Normal cardiovascular exam Rhythm:Regular Rate:Normal     Neuro/Psych    GI/Hepatic   Endo/Other  Hypothyroidism   Renal/GU      Musculoskeletal   Abdominal Normal abdominal exam  (+)   Peds  Hematology  (+) anemia ,   Anesthesia Other Findings   Reproductive/Obstetrics                             Anesthesia Physical Anesthesia Plan  ASA: II  Anesthesia Plan: General   Post-op Pain Management:    Induction: Intravenous  PONV Risk Score and Plan: Ondansetron  Airway Management Planned: Oral ETT  Additional Equipment:   Intra-op Plan:   Post-operative Plan: Extubation in OR  Informed Consent: I have reviewed the patients History and Physical, chart, labs and discussed the procedure including the risks, benefits and alternatives for the proposed anesthesia with the patient or authorized representative who has indicated his/her understanding and acceptance.   Dental advisory given  Plan Discussed with: CRNA and Surgeon  Anesthesia Plan Comments:         Anesthesia Quick Evaluation

## 2018-05-17 NOTE — Procedures (Signed)
I was present at this session.  I have reviewed the session itself and made appropriate changes.  HD via RUA avg, access prss ok. bp tol vol off.   Jeneen Rinks Sasha Rueth 10/16/20198:28 AM

## 2018-05-17 NOTE — Interval H&P Note (Signed)
History and Physical Interval Note:  05/17/2018 1:33 PM  Sonya [name]  has presented today for surgery, with the diagnosis of right distal femur fracture  The various methods of treatment have been discussed with the patient and family. After consideration of risks, benefits and other options for treatment, the patient has consented to  Procedure(s): OPEN REDUCTION INTERNAL FIXATION (ORIF) DISTAL FEMUR FRACTURE (Right) as a surgical intervention .  The patient's history has been reviewed, patient examined, no change in status, stable for surgery.  I have reviewed the patient's chart and labs.  Questions were answered to the patient's satisfaction.     Lennette Bihari P [place]

## 2018-05-17 NOTE — Op Note (Signed)
OrthopaedicSurgeryOperativeNote [phone]) Date of Surgery: 05/17/2018  Admit Date: 05/16/2018   Diagnoses: Pre-Op Diagnoses: Right supracondylar distal femur fracture   Post-Op Diagnosis: Same  Procedures: CPT 27511-Open reduction internal fixation of right distal femur fracture  Surgeons: Primary: Shona Needles, MD   Location:[facility] OR ROOM 07   Anesthesia: General  Antibiotics:Ancef 2g preop   Tourniquettime:* No tourniquets in log * .  [id number]:809 mL   Complications:None  Specimens:None  Implants: Implant Name Type Inv. Item Serial No. Manufacturer Lot No. LRB No. Used Action  CAP LOCK NCB - [id number] Cap CAP LOCK NCB  [employer](ORTH,TRAU,BIO,SG)  Right 8 Implanted  PLATE DISTAL FEMUR 39J 317M RT - [id number] Plate PLATE DISTAL FEMUR 02I 317M RT  [employer](ORTH,TRAU,BIO,SG)  Right 1 Implanted  SCREW 5.0 80MM - [id number] Screw SCREW 5.0 80MM  [employer](ORTH,TRAU,BIO,SG)  Right 4 Implanted  SCREW NCB 5.0X75MM - [id number] Screw SCREW NCB 5.0X75MM  [employer](ORTH,TRAU,BIO,SG)  Right 2 Implanted  SCREW NCB 5.0X36MM - [id number] Screw SCREW NCB 5.0X36MM  [employer](ORTH,TRAU,BIO,SG)  Right 2 Implanted  SCREW NCB 4.0MX34M - [id number] Screw SCREW NCB 4.0MX34M  [employer](ORTH,TRAU,BIO,SG)  Right 1 Implanted    IndicationsforSurgery: 67 year old female with extra articular distal femur fracture from a ground-level fall.  Patient has a history of end-stage renal disease with dialysis.  She also has a history of peripheral artery disease after a angioplasty within the last month . Discussed risks and benefits of proceeding with surgical fixation.  Risks included but not limited to bleeding, infection, malunion, nonunion, posttraumatic arthritis, stiffness, need for revision surgery, and even the possibility of a stroke heart attack or death.  In light of these wrist the patient wishes to proceed with surgery consent was  obtained.  Operative Findings: Open reduction internal fixation of right distal femur fracture using [employer] 15-hole NCB Distal femoral locking plate  Procedure: The patient was identified in the preoperative holding area. Consent was confirmed with the patient and their family and all questions were answered. The operative extremity was marked after confirmation with the patient. The patientwas then brought back to the operating room by our anesthesia colleagues. The patient was placed under general anesthesia and was carefully transferred over to a radiolucent flattop table. They were secured to the bed and all bony prominences were padded.  A bump was placed under the operative hip. The operative extremity was then prepped and draped in usual sterile fashion. A timeout was performed to verify the patient, the procedure and extremity. Preoperative antibiotics were dosed.  Fluoroscopy was used to identify the fracture. A lateral approach was made to the distal femur. It was taken down through the skin and the IT band was incised in line with the incision. The distal portion of the vastus lateralis was reflected off the IT band and the distal articular block of the lateral femoral condyle was exposed.  An attempt was made to keep the fracture from being stripped or devitalized. I used fluoroscopy to identify the correct length plate to bridge the whole femoral shaft. I chose a 15-hole plate. The aiming arm was attached to the plate and slide submuscularly under the vastus to the proximal femur. The distal portion of the plate was placed flush against the lateral cortex of the distal articular block. A 3.59mm drill bit was used to position the proximal portion of the plate. A perfect lateral was obtained to show appropriate positioning of the plate.  The distal segment was drilled and 5.16mm  cortical screws were placed in the distal fracture segment. A total of 2 screws were placed initially. A 5.0  mm screw was placed percutaneously in the shaft to reduce the coronal alignment. A total of three shaft screws were placed to allow for a significant working length and micromotion of the fracture. Locking caps were placed in the shaft screws except for the most proximal screw to prevent a stress riser. The targeting guide was removed. Four more screws were placed in the distal segment. Fluoroscopy was used to confirm adequate reduction of the fracture and appropriate position of the plate and screws. Locking caps were placed on all of the distal screws.  The incisions were irrigated with normal saline. A gram of vancomycin powder was placed in the incision around the plate. The IT band was closed with 0-vicryl. The skin was closed with 2-vicryl, 3-0 nylon. The incisions were dressed with a Mepilex dressing and bactracin ointment, adaptec, 4x4s and sterile cast padding and the leg was wrapped with a ACE wrap. The patient was then transferred to the regular floor bed and taken to the PACU in stable condition.  Post Op Plan/Instructions: Patient will be weightbearing as tolerated. She will receive ancef postoperatively. Sucutaneous heparin postoperatively as she is on dialysis and contraindicated for lovenox.  I was present and performed the entire surgery.  Katha Hamming, MD [facility]

## 2018-05-18 ENCOUNTER — Encounter (HOSPITAL_COMMUNITY): Payer: Self-pay | Admitting: Student

## 2018-05-18 LAB — CBC WITH DIFFERENTIAL/PLATELET
Abs Immature Granulocytes: 0.03 10*3/uL (ref 0.00–0.07)
BASOS ABS: 0 10*3/uL (ref 0.0–0.1)
BASOS PCT: 0 %
EOS ABS: 0 10*3/uL (ref 0.0–0.5)
EOS PCT: 0 %
HCT: 28.3 % — ABNORMAL LOW (ref 36.0–46.0)
Hemoglobin: 8.5 g/dL — ABNORMAL LOW (ref 12.0–15.0)
Immature Granulocytes: 0 %
LYMPHS ABS: 0.6 10*3/uL — AB (ref 0.7–4.0)
Lymphocytes Relative: 8 %
MCH: 25.1 pg — ABNORMAL LOW (ref 26.0–34.0)
MCHC: 30 g/dL (ref 30.0–36.0)
MCV: 83.5 fL (ref 80.0–100.0)
MONOS PCT: 12 %
Monocytes Absolute: 1 10*3/uL (ref 0.1–1.0)
Neutro Abs: 6.4 10*3/uL (ref 1.7–7.7)
Neutrophils Relative %: 80 %
PLATELETS: 233 10*3/uL (ref 150–400)
RBC: 3.39 MIL/uL — ABNORMAL LOW (ref 3.87–5.11)
RDW: 16.1 % — AB (ref 11.5–15.5)
WBC: 8 10*3/uL (ref 4.0–10.5)
nRBC: 0 % (ref 0.0–0.2)

## 2018-05-18 LAB — PTH, INTACT AND CALCIUM
CALCIUM TOTAL (PTH): 7.8 mg/dL — AB (ref 8.7–10.3)
PTH: 851 pg/mL — ABNORMAL HIGH (ref 15–65)

## 2018-05-18 LAB — BASIC METABOLIC PANEL
Anion gap: 15 (ref 5–15)
BUN: 23 mg/dL (ref 8–23)
CALCIUM: 7.6 mg/dL — AB (ref 8.9–10.3)
CO2: 23 mmol/L (ref 22–32)
CREATININE: 5.97 mg/dL — AB (ref 0.44–1.00)
Chloride: 98 mmol/L (ref 98–111)
GFR calc Af Amer: 8 mL/min — ABNORMAL LOW (ref >60)
GFR, EST NON AFRICAN AMERICAN: 7 mL/min — AB (ref >60)
GLUCOSE: 110 mg/dL — AB (ref 70–99)
Potassium: 4.1 mmol/L (ref 3.5–5.1)
Sodium: 136 mmol/L (ref 135–145)

## 2018-05-18 LAB — VITAMIN D 25 HYDROXY (VIT D DEFICIENCY, FRACTURES): Vit D, 25-Hydroxy: 53.5 ng/mL (ref 30.0–100.0)

## 2018-05-18 MED ORDER — DARBEPOETIN ALFA 150 MCG/0.3ML IJ SOSY
150.0000 ug | PREFILLED_SYRINGE | INTRAMUSCULAR | Status: DC
  Administered 2018-05-19: 150 ug via INTRAVENOUS
  Filled 2018-05-18: qty 0.3

## 2018-05-18 MED ORDER — CALCIUM ACETATE (PHOS BINDER) 667 MG PO CAPS
667.0000 mg | ORAL_CAPSULE | Freq: Three times a day (TID) | ORAL | Status: DC
  Administered 2018-05-18 – 2018-05-24 (×14): 667 mg via ORAL
  Filled 2018-05-18 (×15): qty 1

## 2018-05-18 MED ORDER — SODIUM CHLORIDE 0.9 % IV SOLN
INTRAVENOUS | Status: DC
  Administered 2018-05-18: 18:00:00 via INTRAVENOUS

## 2018-05-18 MED ORDER — CLOPIDOGREL BISULFATE 75 MG PO TABS
75.0000 mg | ORAL_TABLET | Freq: Every day | ORAL | Status: DC
  Administered 2018-05-19: 75 mg via ORAL
  Filled 2018-05-18: qty 1

## 2018-05-18 MED ORDER — CHLORHEXIDINE GLUCONATE CLOTH 2 % EX PADS: 6.0000 | MEDICATED_PAD | Freq: Every day | CUTANEOUS | Status: DC

## 2018-05-18 MED ORDER — ATORVASTATIN CALCIUM 40 MG PO TABS
40.0000 mg | ORAL_TABLET | Freq: Every day | ORAL | Status: DC
  Administered 2018-05-18 – 2018-05-21 (×3): 40 mg via ORAL
  Filled 2018-05-18 (×3): qty 1

## 2018-05-18 MED ORDER — CEFAZOLIN SODIUM-DEXTROSE 1-4 GM/50ML-% IV SOLN
1.0000 g | Freq: Once | INTRAVENOUS | Status: AC
  Administered 2018-05-18: 1 g via INTRAVENOUS
  Filled 2018-05-18: qty 50

## 2018-05-18 NOTE — Progress Notes (Signed)
Subjective: Interval History: has no complaint .  Objective: Vital signs in last 24 hours: Temp:  [97.3 F (36.3 C)-98.9 F (37.2 C)] 98.7 F (37.1 C) (10/17 0920) Pulse Rate:  [85-102] 99 (10/17 0920) Resp:  [15-20] 18 (10/17 0920) BP: (62-145)/(37-84) 117/51 (10/17 0920) SpO2:  [96 %-100 %] 99 % (10/17 0920) Weight:  [64.4 kg] 64.4 kg (10/16 1153) Weight change:   Intake/Output from previous day: 10/16 0701 - 10/17 0700 In: -  Out: 1772 [Blood:100] Intake/Output this shift: No intake/output data recorded.  General appearance: alert, cooperative, no distress and slowed mentation Resp: clear to auscultation bilaterally Cardio: S1, S2 normal and systolic murmur: systolic ejection 2/6, crescendo and decrescendo at 2nd left intercostal space GI: soft, non-tender; bowel sounds normal; no masses,  no organomegaly Extremities: AVG RUA,  leg incision R  Lab Results: Recent Labs    05/17/18 0320 05/18/18 0246  WBC 5.8 8.0  HGB 10.1* 8.5*  HCT 34.3* 28.3*  PLT 272 233   BMET:  Recent Labs    05/17/18 1840 05/18/18 0246  NA 140 136  K 4.6 4.1  CL 102 98  CO2 21* 23  GLUCOSE 106* 110*  BUN 19 23  CREATININE 5.43* 5.97*  CALCIUM 7.8* 7.6*   No results for input(s): PTH in the last 72 hours. Iron Studies: No results for input(s): IRON, TIBC, TRANSFERRIN, FERRITIN in the last 72 hours.  Studies/Results: Dg C-arm 1-60 Min  Result Date: 05/17/2018 CLINICAL DATA:  ORIF right femur fracture EXAM: DG C-ARM 61-120 MIN; RIGHT FEMUR 2 VIEWS FLUOROSCOPY TIME:  1 minutes, 44 seconds COMPARISON:  Right femur radiographs-05/16/2018 FINDINGS: Seven spot intraoperative fluoroscopic images the distal aspect the right femur are provided for review Initial images demonstrate known comminuted displaced distal femur fracture. Subsequent images demonstrate sideplate fixation of the femur fracture with multiple transfixing cancellous screws. Improved alignment of the fracture fragments with  minimal residual displacement. Expected adjacent subcutaneous emphysema. No radiopaque foreign body. IMPRESSION: Post sideplate fixation of comminuted distal femur fracture without evidence of complication. Electronically Signed   By: Sandi Mariscal M.D.   On: 05/17/2018 15:57   Dg Femur, Min 2 Views Right  Result Date: 05/17/2018 CLINICAL DATA:  ORIF right femur fracture EXAM: DG C-ARM 61-120 MIN; RIGHT FEMUR 2 VIEWS FLUOROSCOPY TIME:  1 minutes, 44 seconds COMPARISON:  Right femur radiographs-05/16/2018 FINDINGS: Seven spot intraoperative fluoroscopic images the distal aspect the right femur are provided for review Initial images demonstrate known comminuted displaced distal femur fracture. Subsequent images demonstrate sideplate fixation of the femur fracture with multiple transfixing cancellous screws. Improved alignment of the fracture fragments with minimal residual displacement. Expected adjacent subcutaneous emphysema. No radiopaque foreign body. IMPRESSION: Post sideplate fixation of comminuted distal femur fracture without evidence of complication. Electronically Signed   By: Sandi Mariscal M.D.   On: 05/17/2018 15:57   Dg Femur Port, Min 2 Views Right  Result Date: 05/17/2018 CLINICAL DATA:  Postop femur fracture EXAM: RIGHT FEMUR PORTABLE 2 VIEW COMPARISON:  05/17/2018, 05/16/2018 FINDINGS: Vascular calcifications. Right femoral head projects in joint. Interval surgical plate and multiple screw fixation of the right femur for comminuted distal femoral fracture. Decreased displacement of fracture fragments. Gas in the soft tissues consistent with recent surgery. IMPRESSION: Interval surgical plate and multiple screw fixation of comminuted distal femoral fracture with decreased displacement and angulation. Electronically Signed   By: Donavan Foil M.D.   On: 05/17/2018 21:09    I have reviewed the patient's current medications.  Assessment/Plan: 1 ESRD for HD tomorrow 2 Anemia start esa, check  Fe 3 HPTH check 4 femur fx. Per Ortho 5 Hx CVA 6 Hx ICB P HD, esa, check Fe, Pth   LOS: 2 days   Jeneen Rinks Mohamed [place] 05/18/2018,9:57 AM

## 2018-05-18 NOTE — Evaluation (Signed)
Physical Therapy Evaluation Patient Details Name: Sonya [name] MRN: [id number] DOB: Jan 23, 1951 Today's Date: 05/18/2018   History of Present Illness  is a 67 y.o. female with medical history of  anemia of CKD, anxiety, ESRD on hemodialysis Tuesday/Thursday/Saturday, diverticulosis/diverticulitis, history of dysphagia, history of fracture of left femoral neck, GERD, hypertension, hyperparathyroidism due to renal insufficiency, hypothyroidism, intracerebral hemorrhage, meningocele, protein calorie malnutrition, ischemic stroke, history of uterine cancer presenting after mechanical fall going to the bathroom.  s/p 05/17/18 ORIF of condyle fx of R femur  Clinical Impression  Patient is s/p above surgery resulting in functional limitations due to the deficits listed below (see PT Problem List). Pt limited in safe mobility by increased pain with movement and decreased cognition. Pt is maxAx2 for bed mobility and unable to attain fully upright with maxAx2.  Patient will benefit from skilled PT to increase their independence and safety with mobility to allow discharge to the venue listed below.       Follow Up Recommendations SNF    Equipment Recommendations  None recommended by PT    Recommendations for Other Services OT consult     Precautions / Restrictions Precautions Precautions: Fall Restrictions Weight Bearing Restrictions: Yes RLE Weight Bearing: Weight bearing as tolerated      Mobility  Bed Mobility Overal bed mobility: Needs Assistance Bed Mobility: Supine to Sit;Sit to Supine     Supine to sit: Mod assist;+2 for physical assistance Sit to supine: Max assist;+2 for physical assistance   General bed mobility comments: modAx2 for sitting EoB, pt able to initiate L LE movement and trunk movement but stops as soon as painful L LE moved,   Transfers Overall transfer level: Needs assistance Equipment used: Rolling walker (2 wheeled) Transfers: Sit to/from Stand Sit to  Stand: Max assist;+2 physical assistance         General transfer comment: maxAx2 for sit to stand to RW, pt unable to reach fully upright secondary to pain       Balance Overall balance assessment: Needs assistance Sitting-balance support: Feet supported;Bilateral upper extremity supported Sitting balance-Leahy Scale: Poor Sitting balance - Comments: requires UE support to maintain balance EoB                                     Pertinent Vitals/Pain Pain Assessment: 0-10 Pain Score: 8  Pain Location: R leg Pain Descriptors / Indicators: Grimacing;Guarding Pain Intervention(s): Limited activity within patient's tolerance;Monitored during session;Repositioned;Patient requesting pain meds-RN notified    Home Living Family/patient expects to be discharged to:: Private residence Living Arrangements: Children Available Help at Discharge: Family;Available PRN/intermittently Type of Home: House Home Access: Stairs to enter Entrance Stairs-Rails: None Entrance Stairs-Number of Steps: 1 Home Layout: Two level;Laundry or work area in basement;Able to live on main level with bedroom/bathroom Home Equipment: Environmental consultant - 2 wheels;Cane - single point;Wheelchair - [employer] - 4 wheels;Bedside commode      Prior Function Level of Independence: Independent with assistive device(s)         Comments: daughter assists with transportation; wheelchair for community mobility and RW for home ambulation      Hand Dominance        Extremity/Trunk Assessment   Upper Extremity Assessment Upper Extremity Assessment: Overall WFL for tasks assessed    Lower Extremity Assessment Lower Extremity Assessment: LLE deficits/detail;Generalized weakness;Difficult to assess due to impaired cognition LLE Deficits / Details: L knee wrapped in extension  from hip to ankle, limiting ROM   LLE: Unable to fully assess due to immobilization    Cervical / Trunk Assessment Cervical /  Trunk Assessment: Kyphotic  Communication   Communication: No difficulties  Cognition Arousal/Alertness: Awake/alert Behavior During Therapy: Flat affect Overall Cognitive Status: Impaired/Different from baseline Area of Impairment: Orientation;Memory;Awareness                 Orientation Level: Place;Situation   Memory: Decreased short-term memory     Awareness: Emergent   General Comments: daughter reports confusion is new but she has had limited home meds since she was admitted      General Comments General comments (skin integrity, edema, etc.): Daughter present during session, pt with c/o of dizziness in seated BP 89/51 attempted standing but unable to attain secondary to pain, second BP in sitting 86/53        Assessment/Plan    PT Assessment Patient needs continued PT services  PT Problem List Decreased strength;Decreased activity tolerance;Decreased balance;Decreased mobility;Decreased cognition;Pain       PT Treatment Interventions DME instruction;Gait training;Functional mobility training;Therapeutic activities;Therapeutic exercise;Balance training;Cognitive remediation;Patient/family education    PT Goals (Current goals can be found in the Care Plan section)  Acute Rehab PT Goals Patient Stated Goal: not be in pain PT Goal Formulation: With patient Time For Goal Achievement: 06/01/18    Frequency Min 3X/week    AM-PAC PT "6 Clicks" Daily Activity  Outcome Measure Difficulty turning over in bed (including adjusting bedclothes, sheets and blankets)?: Unable Difficulty moving from lying on back to sitting on the side of the bed? : Unable Difficulty sitting down on and standing up from a chair with arms (e.g., wheelchair, bedside commode, etc,.)?: Unable Help needed moving to and from a bed to chair (including a wheelchair)?: Total Help needed walking in hospital room?: Total Help needed climbing 3-5 steps with a railing? : Total 6 Click Score: 6     End of Session Equipment Utilized During Treatment: Gait belt Activity Tolerance: Patient limited by pain Patient left: in bed;with call bell/phone within reach;with bed alarm set;with family/visitor present Nurse Communication: Mobility status;Weight bearing status PT Visit Diagnosis: Unsteadiness on feet (R26.81);Other abnormalities of gait and mobility (R26.89);Muscle weakness (generalized) (M62.81);Difficulty in walking, not elsewhere classified (R26.2);Pain Pain - Right/Left: Right Pain - part of body: Leg    Time: 4782-9562 PT Time Calculation (min) (ACUTE ONLY): 28 min   Charges:   PT Evaluation $PT Eval Moderate Complexity: 1 Mod PT Treatments $Therapeutic Activity: 8-22 mins        Mava Suares B. Beverely Risen PT, DPT Acute Rehabilitation Services Pager [phone] Office [phone]   Elon Alas Fleet 05/18/2018, 3:10 PM

## 2018-05-18 NOTE — Progress Notes (Signed)
PROGRESS NOTE  RUT BETTERTON HDQ:[id number] DOB: [birth year] DOA: 05/16/2018 PCP: Redmond School, MD  HPI/Recap of past 24 hours: Sonya [name] is a 67 y.o. female with medical history of anemia of CKD, anxiety, ESRD on hemodialysis Tuesday/Thursday/Saturday, diverticulosis/diverticulitis, history of dysphagia, history of fracture of left femoral neck, GERD, hypertension, hyperparathyroidism due to renal insufficiency, hypothyroidism, intracerebral hemorrhage, meningocele, protein calorie malnutrition, ischemic stroke, history of uterine cancer presenting after mechanical fall going to the bathroom.  The patient states that she was using her walker.  She was trying to get up off the commode when her right leg "gave out", and the patient fell onto her right side.  The patient had extreme pain and was unable to bear any weight.  EMS was activated.  In the emergency department, x-rays of the hip revealed an acute comminuted fracture of the right femoral diaphysis with half shaft width displacement. Pt admitted for further management.   Today, pt complained of post-op pain, advised to ask for pain meds. Denies any chest pain, SOB, abdominal pain, fever/chills.  Assessment/Plan: Active Problems:   Essential hypertension   Hyperparathyroidism due to renal insufficiency (HCC)   Hypothyroidism, adult   Hyperlipidemia   End stage renal failure on dialysis [facility])   Fracture of condyle of right femur, closed, initial encounter ([employer])   Closed fracture of right femur, unspecified fracture morphology, initial encounter ([facility])  Acute comminuted fracture of the right femur s/p ORIF on 05/17/18 Orthopedics on board Pain management and DVT ppx as per ortho PT eval after surgery  ESRD Nephrology on board HD as per nephrology  Secondary hyperparathyroidism Management per nephrology  Essential hypertension BP soft Held nifedipine  Anemia of CKD/Acute blood loss post op Baseline hemoglobin  11-12 Hemoglobin 8.5, no signs of bleeding Daily cbc  History of ischemic stroke Continue ASA Holding plavix due to recent surgery Continue statin  Hyperlipidemia Continue statin--increased atorvastatin to40 mg daily  Hypothyroidism Continue synthroid    Code Status: Full   Family Communication: None at bedside  Disposition Plan: SNF   Consultants:  Orthopedics  Procedures:  ORIF on 05/17/18  Antimicrobials:  None  DVT prophylaxis: Heparin   Objective: Vitals:   05/18/18 0056 05/18/18 0447 05/18/18 0920 05/18/18 1349  BP: 132/64 (!) 110/57 (!) 117/51 (!) 86/53  Pulse: 93 96 99 (!) 115  Resp:   18 16  Temp: 98.9 F (37.2 C) 98.2 F (36.8 C) 98.7 F (37.1 C) 98.5 F (36.9 C)  TempSrc: Oral Oral Oral Oral  SpO2: 100% 99% 99% 100%  Weight:      Height:        Intake/Output Summary (Last 24 hours) at 05/18/2018 1719 Last data filed at 05/18/2018 1100 Gross per 24 hour  Intake 240 ml  Output -  Net 240 ml   Filed Weights   05/16/18 0505 05/17/18 0745 05/17/18 1153  Weight: 61.2 kg 66.4 kg 64.4 kg    Exam:   General: NAD, appears older than stated age  Cardiovascular: S1, S2 present  Respiratory: CTAB  Abdomen: Soft, NT, ND, BS present  Musculoskeletal: RLE wrapped in ACE c/d/i, LLE no pedal edema noted  Skin: Normal  Psychiatry: Normal mood   Data Reviewed: CBC: Recent Labs  Lab 05/16/18 0620 05/17/18 0320 05/18/18 0246  WBC 4.9 5.8 8.0  NEUTROABS 3.5  --  6.4  HGB 11.2* 10.1* 8.5*  HCT 37.5 34.3* 28.3*  MCV 83.7 84.5 83.5  PLT 275 272 119   Basic Metabolic  Panel: Recent Labs  Lab 05/16/18 0620 05/16/18 1702 05/17/18 0740 05/17/18 0820 05/17/18 1840 05/18/18 0246  NA 136 137 137  --  140 136  K 3.4* 4.5 4.7  --  4.6 4.1  CL 100 101 101  --  102 98  CO2 21* 24 22  --  21* 23  GLUCOSE 111* 101* 91  --  106* 110*  BUN 37* 42* 50*  --  19 23  CREATININE 9.38* 10.30* 11.17*  --  5.43* 5.97*  CALCIUM 8.1*  8.1* 7.5* 7.8* 7.8* 7.6*  PHOS  --  4.6 5.6*  --  5.6*  --    GFR: Estimated Creatinine Clearance: 8.1 mL/min (A) (by C-G formula based on SCr of 5.97 mg/dL (H)). Liver Function Tests: Recent Labs  Lab 05/16/18 0620 05/16/18 1702 05/17/18 0740 05/17/18 1840  AST 13*  --   --   --   ALT 8  --   --   --   ALKPHOS 92  --   --   --   BILITOT 0.7  --   --   --   PROT 7.8  --   --   --   ALBUMIN 3.6 3.2* 2.9* 2.9*   No results for input(s): LIPASE, AMYLASE in the last 168 hours. No results for input(s): AMMONIA in the last 168 hours. Coagulation Profile: Recent Labs  Lab 05/16/18 0620 05/17/18 0320  INR 1.20 1.32   Cardiac Enzymes: No results for input(s): CKTOTAL, CKMB, CKMBINDEX, TROPONINI in the last 168 hours. BNP (last 3 results) No results for input(s): PROBNP in the last 8760 hours. HbA1C: No results for input(s): HGBA1C in the last 72 hours. CBG: No results for input(s): GLUCAP in the last 168 hours. Lipid Profile: No results for input(s): CHOL, HDL, LDLCALC, TRIG, CHOLHDL, LDLDIRECT in the last 72 hours. Thyroid Function Tests: No results for input(s): TSH, T4TOTAL, FREET4, T3FREE, THYROIDAB in the last 72 hours. Anemia Panel: No results for input(s): VITAMINB12, FOLATE, FERRITIN, TIBC, IRON, RETICCTPCT in the last 72 hours. Urine analysis:    Component Value Date/Time   COLORURINE YELLOW 04/14/2015 1427   APPEARANCEUR CLEAR 04/14/2015 1427   LABSPEC 1.020 04/14/2015 1427   PHURINE 8.5 (H) 04/14/2015 1427   GLUCOSEU 100 (A) 04/14/2015 1427   HGBUR SMALL (A) 04/14/2015 1427   BILIRUBINUR NEGATIVE 04/14/2015 1427   KETONESUR NEGATIVE 04/14/2015 1427   PROTEINUR >300 (A) 04/14/2015 1427   UROBILINOGEN 0.2 04/14/2015 1427   NITRITE NEGATIVE 04/14/2015 1427   LEUKOCYTESUR TRACE (A) 04/14/2015 1427   Sepsis Labs: @LABRCNTIP (procalcitonin:4,lacticidven:4)  ) Recent Results (from the past 240 hour(s))  Surgical PCR screen     Status: None   Collection Time:  05/16/18  6:11 PM  Result Value Ref Range Status   MRSA, PCR NEGATIVE NEGATIVE Final   Staphylococcus aureus NEGATIVE NEGATIVE Final    Comment: (NOTE) The Xpert SA Assay (FDA approved for NASAL specimens in patients 73 years of age and older), is one component of a comprehensive surveillance program. It is not intended to diagnose infection nor to guide or monitor treatment. Performed at [facility], [place] [address], [place], [place] [address]       Studies: Dg Femur [place], [place] 2 Views Right  Result Date: 05/17/2018 CLINICAL DATA:  Postop femur fracture EXAM: RIGHT FEMUR PORTABLE 2 VIEW COMPARISON:  05/17/2018, 05/16/2018 FINDINGS: Vascular calcifications. Right femoral head projects in joint. Interval surgical plate and multiple screw fixation of the right femur for  comminuted distal femoral fracture. Decreased displacement of fracture fragments. Gas in the soft tissues consistent with recent surgery. IMPRESSION: Interval surgical plate and multiple screw fixation of comminuted distal femoral fracture with decreased displacement and angulation. Electronically Signed   By: Donavan Foil M.D.   On: 05/17/2018 21:09    Scheduled Meds: . allopurinol  100 mg Oral Q Wed  . aspirin EC  81 mg Oral Daily  . atorvastatin  10 mg Oral q1800  . calcium acetate  667 mg Oral TID WC  . Chlorhexidine Gluconate Cloth  6 each Topical Q0600  . [START ON 05/19/2018] darbepoetin (ARANESP) injection - DIALYSIS  150 mcg Intravenous Q Fri-HD  . gabapentin  100 mg Oral QHS  . heparin  5,000 Units Subcutaneous Q8H  . levothyroxine  50 mcg Oral QAC breakfast  . lidocaine-prilocaine  1 application Topical Q X,JD,[id number]  . multivitamin  1 tablet Oral QHS  . pentoxifylline  400 mg Oral TID WC    Continuous Infusions:   LOS: 2 days     Alma Friendly, MD [facility]   If 7PM-7AM, please contact night-coverage [email or web address] 05/18/2018, 5:19 PM

## 2018-05-18 NOTE — Progress Notes (Signed)
PHARMACIST - PHYSICIAN COMMUNICATION  CONCERNING:  Cefazolin 1 gram iv Q 8 x 3 doses post - op   RECOMMENDATION: One dose of cefazolin scheduled 24 hours post pre-op dose  DESCRIPTION: 66 year old female with ESRD Cefazolin adjusted for renal dysfunction  Thank you. Anette Guarneri, PharmD

## 2018-05-18 NOTE — Evaluation (Signed)
Occupational Therapy Evaluation Patient Details Name: Sonya [name] MRN: [id number] DOB: August 30, 1950 Today's Date: 05/18/2018    History of Present Illness is a 67 y.o. female with medical history of  anemia of CKD, anxiety, ESRD on hemodialysis Tuesday/Thursday/Saturday, diverticulosis/diverticulitis, history of dysphagia, history of fracture of left femoral neck, GERD, hypertension, hyperparathyroidism due to renal insufficiency, hypothyroidism, intracerebral hemorrhage, meningocele, protein calorie malnutrition, ischemic stroke, history of uterine cancer presenting after mechanical fall going to the bathroom.  s/p 05/17/18 ORIF of condyle fx of R femur   Clinical Impression   Pt admitted with above diagnoses. Met pt lying supine in bed, daughter present. Pt limited 2/2 pain and altered mental status. Pt is oriented to self, deficits noted in command following throughout evaluation. Daughter reports pt was becoming forgetful at home, but this status of confusion is new. Pt needing much encouragement and motivation to participate, states "it hurts" when everything moved. Attempted to assess BUE strength, pt not willing to move arms. Passively raised arms to ~100 degrees, pt letting arms fall, but uses them in transfer and repositioning. Supine <> sit completed with max A +2 with support needed at trunk and BLEs. Pt limits mobility once experiencing pain in RLE. Tolerated sitting EOB for 1-3 minutes. Heavy reliance on BUEs. Reports feelings of dizziness upon sitting EOB, returned to bed and dizziness had subsided. SNF d/c recommendation appropriate at this time to address functional deficits in transfer and BADL. Will continue to follow pt acutely to progress functional mobility in BADL activity.    Follow Up Recommendations  SNF    Equipment Recommendations  Other (comment)(TBD per pt progress)    Recommendations for Other Services       Precautions / Restrictions Precautions Precautions:  Fall Restrictions Weight Bearing Restrictions: Yes RLE Weight Bearing: Weight bearing as tolerated      Mobility Bed Mobility Overal bed mobility: Needs Assistance Bed Mobility: Supine to Sit;Sit to Supine     Supine to sit: Max assist;+2 for physical assistance Sit to supine: Max assist;+2 for physical assistance   General bed mobility comments: pt needing max A +2 for EOB, support at RLE needed (Cannot tolerate dangling EOB). Pt initiates LLE movement in t/f. Once in sitting, pt reports dizziness, max A+2 sit > supine  Transfers Overall transfer level: Needs assistance Equipment used: Rolling walker (2 wheeled) Transfers: Sit to/from Stand Sit to Stand: Max assist;+2 physical assistance         General transfer comment: maxAx2 for sit to stand to RW, pt unable to reach fully upright secondary to pain     Balance Overall balance assessment: Needs assistance Sitting-balance support: Feet supported;Bilateral upper extremity supported Sitting balance-Leahy Scale: Poor Sitting balance - Comments: unable to tolerate RLE fully on ground, requires UE support to maintain balance                                   ADL either performed or assessed with clinical judgement   ADL Overall ADL's : Needs assistance/impaired Eating/Feeding: Moderate assistance;Bed level Eating/Feeding Details (indicate cue type and reason): daughter cutting up bites and preparing plate for pt to eat, states she has not had to do this at baseline. Pt eventually needing total A for feeding 2/2 confusion Grooming: Moderate assistance;Bed level   Upper Body Bathing: Maximal assistance;Bed level   Lower Body Bathing: Total assistance;Bed level;+2 for physical assistance;+2 for safety/equipment   Upper Body Dressing :  Maximal assistance;Bed level   Lower Body Dressing: Total assistance;Bed level   Toilet Transfer: Total assistance   Toileting- Clothing Manipulation and Hygiene: Total  assistance   Tub/ Shower Transfer: Total assistance   Functional mobility during ADLs: Total assistance;+2 for physical assistance;+2 for safety/equipment;Cueing for safety;Cueing for sequencing General ADL Comments: Pt presents with marked difficulty in functional mobility 2/2 pain and cognitive status. Pt frequently stating "it hurts" and ceasing to engage in activity without significant encouragement from OT and daughter. Pt is currently total A for OOB ADL activity at this time.      Vision Baseline Vision/History: Wears glasses Wears Glasses: Reading only Patient Visual Report: Other (comment)(difficult to assess per cognition)       Perception     Praxis      Pertinent Vitals/Pain Pain Assessment: Faces Pain Score: 8  Faces Pain Scale: Hurts even more Pain Location: R leg Pain Descriptors / Indicators: Grimacing;Guarding Pain Intervention(s): Limited activity within patient's tolerance;Monitored during session;Premedicated before session;Repositioned     Hand Dominance     Extremity/Trunk Assessment Upper Extremity Assessment Upper Extremity Assessment: Difficult to assess due to impaired cognition(pt would not follow commands for UE strength assessment, stating "no it hurts". Would not hold arms up when moved passively, but used arms for some support with t/f)   Lower Extremity Assessment Lower Extremity Assessment: LLE deficits/detail;Generalized weakness;Difficult to assess due to impaired cognition LLE Deficits / Details: L knee wrapped in extension from hip to ankle, limiting ROM   LLE: Unable to fully assess due to immobilization   Cervical / Trunk Assessment Cervical / Trunk Assessment: Kyphotic   Communication Communication Communication: No difficulties   Cognition Arousal/Alertness: Awake/alert Behavior During Therapy: Flat affect Overall Cognitive Status: Impaired/Different from baseline Area of Impairment: Orientation;Memory;Awareness;Following  commands                 Orientation Level: Place;Situation   Memory: Decreased short-term memory Following Commands: Follows one step commands with increased time;Follows one step commands inconsistently   Awareness: Emergent   General Comments: daughter reports pt was becomming forgetful with smaller things, but this state fo confusion is new   General Comments  Daughter present during session, pt with c/o of dizziness in seated BP 89/51 attempted standing but unable to attain secondary to pain, second BP in sitting 86/53    Exercises     Shoulder Instructions      Home Living Family/patient expects to be discharged to:: Private residence Living Arrangements: Children Available Help at Discharge: Family;Available PRN/intermittently Type of Home: House Home Access: Stairs to enter [employer] of Steps: 1 Entrance Stairs-Rails: None Home Layout: Two level;Laundry or work area in basement;Able to live on main level with bedroom/bathroom;Other (Comment)(two level per PT note- pt reports one level (questionable cognition))     Bathroom Shower/Tub: Teacher, early years/pre: Standard Bathroom Accessibility: Yes   Home Equipment: Walker - 2 wheels;Cane - single point;Wheelchair - [employer] - 4 wheels;Bedside commode          Prior Functioning/Environment Level of Independence: Independent with assistive device(s)        Comments: daughter reports intermittent assist with community mobility, but reports pt is ind with ADLS        OT Problem List: Decreased strength;Decreased knowledge of use of DME or AE;Decreased range of motion;Decreased knowledge of precautions;Decreased activity tolerance;Decreased cognition;Impaired balance (sitting and/or standing);Decreased safety awareness;Pain      OT Treatment/Interventions: Self-care/ADL training;Therapeutic activities;DME and/or AE instruction;Patient/family education;Balance  training    OT  Goals(Current goals can be found in the care plan section) Acute Rehab OT Goals Patient Stated Goal: to control pain OT Goal Formulation: With patient/family Time For Goal Achievement: 06/01/18 Potential to Achieve Goals: Good  OT Frequency: Min 2X/week   Barriers to D/C:            Co-evaluation              AM-PAC PT "6 Clicks" Daily Activity     Outcome Measure Help from another person eating meals?: A Lot(needsA cutting, cues to continue eating, ends up needing fed) Help from another person taking care of personal grooming?: A Lot Help from another person toileting, which includes using toliet, bedpan, or urinal?: Total(bed level) Help from another person bathing (including washing, rinsing, drying)?: Total(bed level) Help from another person to put on and taking off regular upper body clothing?: Total Help from another person to put on and taking off regular lower body clothing?: Total 6 Click Score: 8   End of Session Nurse Communication: Mobility status  Activity Tolerance: Patient tolerated treatment well Patient left: in bed;with call bell/phone within reach;with bed alarm set;with family/visitor present  OT Visit Diagnosis: Other abnormalities of gait and mobility (R26.89);Muscle weakness (generalized) (M62.81);Other symptoms and signs involving cognitive function;Pain Pain - Right/Left: Right Pain - part of body: Hip;Leg                Time: 1610-9604 OT Time Calculation (min): 18 min Charges:  OT General Charges $OT Visit: 1 Visit OT Evaluation $OT Eval Moderate Complexity: 1 Mod OT Treatments $Self Care/Home Management : 8-22 mins  Zenovia Jarred, MSOT, OTR/L [employer] OT/ Acute Relief OT Office [phone]  Zenovia Jarred 05/18/2018, 5:40 PM

## 2018-05-18 NOTE — Progress Notes (Signed)
Orthopaedic Trauma Progress Note  S: Patient confused this AM. Keeps repeat herself asking why her leg is hurting  O:  Vitals:   05/18/18 0056 05/18/18 0447  BP: 132/64 (!) 110/57  Pulse: 93 96  Resp:    Temp: 98.9 F (37.2 C) 98.2 F (36.8 C)  SpO2: 100% 99%    Gen: NAD, Awake and alert, not oriented to place or time RLE: Dressing clean, dry and intact. Compartments soft and compressible. Warm and well perfused foot. Moves ankle and toes. Endorses sensation to foot.  Imaging: Stable postop imaging  Labs:  Results for orders placed or performed during the hospital encounter of 05/16/18 (from the past 24 hour(s))  Renal function panel     Status: Abnormal   Collection Time: 05/17/18  6:40 PM  Result Value Ref Range   Sodium 140 135 - 145 mmol/L   Potassium 4.6 3.5 - 5.1 mmol/L   Chloride 102 98 - 111 mmol/L   CO2 21 (L) 22 - 32 mmol/L   Glucose, Bld 106 (H) 70 - 99 mg/dL   BUN 19 8 - 23 mg/dL   Creatinine, Ser 5.43 (H) 0.44 - 1.00 mg/dL   Calcium 7.8 (L) 8.9 - 10.3 mg/dL   Phosphorus 5.6 (H) 2.5 - 4.6 mg/dL   Albumin 2.9 (L) 3.5 - 5.0 g/dL   GFR calc non Af Amer 7 (L) >60 mL/min   GFR calc Af Amer 8 (L) >60 mL/min   Anion gap 17 (H) 5 - 15  CBC with Differential/Platelet     Status: Abnormal   Collection Time: 05/18/18  2:46 AM  Result Value Ref Range   WBC 8.0 4.0 - 10.5 K/uL   RBC 3.39 (L) 3.87 - 5.11 MIL/uL   Hemoglobin 8.5 (L) 12.0 - 15.0 g/dL   HCT 28.3 (L) 36.0 - 46.0 %   MCV 83.5 80.0 - 100.0 fL   MCH 25.1 (L) 26.0 - 34.0 pg   MCHC 30.0 30.0 - 36.0 g/dL   RDW 16.1 (H) 11.5 - 15.5 %   Platelets 233 150 - 400 K/uL   nRBC 0.0 0.0 - 0.2 %   Neutrophils Relative % 80 %   Neutro Abs 6.4 1.7 - 7.7 K/uL   Lymphocytes Relative 8 %   Lymphs Abs 0.6 (L) 0.7 - 4.0 K/uL   Monocytes Relative 12 %   Monocytes Absolute 1.0 0.1 - 1.0 K/uL   Eosinophils Relative 0 %   Eosinophils Absolute 0.0 0.0 - 0.5 K/uL   Basophils Relative 0 %   Basophils Absolute 0.0 0.0 - 0.1  K/uL   Immature Granulocytes 0 %   Abs Immature Granulocytes 0.03 0.00 - 0.07 K/uL  Basic metabolic panel     Status: Abnormal   Collection Time: 05/18/18  2:46 AM  Result Value Ref Range   Sodium 136 135 - 145 mmol/L   Potassium 4.1 3.5 - 5.1 mmol/L   Chloride 98 98 - 111 mmol/L   CO2 23 22 - 32 mmol/L   Glucose, Bld 110 (H) 70 - 99 mg/dL   BUN 23 8 - 23 mg/dL   Creatinine, Ser 5.97 (H) 0.44 - 1.00 mg/dL   Calcium 7.6 (L) 8.9 - 10.3 mg/dL   GFR calc non Af Amer 7 (L) >60 mL/min   GFR calc Af Amer 8 (L) >60 mL/min   Anion gap 15 5 - 15    Assessment: 67 year old female with a ground-level fall  Injuries: Right distal femur fracture status post  ORIF  Weightbearing: Weightbearing as tolerated no range of motion restrictions  Insicional and dressing care: Dressing to be changed on postoperative day 2  Orthopedic device(s): None needed  CV/Blood loss: Hemoglobin dropped from 10.1--?8.5.  Patient not tachycardic and not hypotensive.  Continue to monitor repeat hemoglobin tomorrow.  Pain management: 1.  Norco 1-2 tabs every 6 hours as needed 2.  Dilaudid 0.5 mg every 2 hours as needed  Would recommend limiting narcotic use in elderly patient to decrease risk of delirium  VTE prophylaxis: Heparin subcutaneous every 8 hours due to renal disease while in house, okay to resume aspirin and plavix for VTE prophylaxis on discharge  ID: Ancef 2 g every 8 hours for postoperative surgical prophylaxis-appears to be discontinued by pharmacist for unknown reason  Foley/Lines: No foley, KVO IVF  Medical co-morbidities: 1. ESRD-dialysis per renal team 2. HTN-continue home medications 3. Stroke history-Okay to restart plavix from orthopaedic perspective  Impediments to Fracture Healing: ESRD and osteoporosis  Dispo: PT/OT eval, likely SNF  Follow - up plan: 2-3 weeks for suture removal and x-rays in office   Shona Needles, MD [facility] [phone] (phone)

## 2018-05-18 NOTE — Plan of Care (Signed)

## 2018-05-19 LAB — CBC WITH DIFFERENTIAL/PLATELET
Abs Immature Granulocytes: 0.03 10*3/uL (ref 0.00–0.07)
BASOS PCT: 0 %
Basophils Absolute: 0 10*3/uL (ref 0.0–0.1)
EOS ABS: 0 10*3/uL (ref 0.0–0.5)
EOS PCT: 0 %
HCT: 24.5 % — ABNORMAL LOW (ref 36.0–46.0)
Hemoglobin: 7.6 g/dL — ABNORMAL LOW (ref 12.0–15.0)
Immature Granulocytes: 0 %
Lymphocytes Relative: 10 %
Lymphs Abs: 0.8 10*3/uL (ref 0.7–4.0)
MCH: 25.2 pg — AB (ref 26.0–34.0)
MCHC: 31 g/dL (ref 30.0–36.0)
MCV: 81.4 fL (ref 80.0–100.0)
MONO ABS: 0.9 10*3/uL (ref 0.1–1.0)
MONOS PCT: 11 %
Neutro Abs: 6.7 10*3/uL (ref 1.7–7.7)
Neutrophils Relative %: 79 %
PLATELETS: 228 10*3/uL (ref 150–400)
RBC: 3.01 MIL/uL — ABNORMAL LOW (ref 3.87–5.11)
RDW: 15.7 % — AB (ref 11.5–15.5)
WBC: 8.6 10*3/uL (ref 4.0–10.5)
nRBC: 0 % (ref 0.0–0.2)

## 2018-05-19 LAB — RENAL FUNCTION PANEL
Albumin: 2.7 g/dL — ABNORMAL LOW (ref 3.5–5.0)
Anion gap: 15 (ref 5–15)
BUN: 44 mg/dL — AB (ref 8–23)
CHLORIDE: 96 mmol/L — AB (ref 98–111)
CO2: 25 mmol/L (ref 22–32)
Calcium: 8 mg/dL — ABNORMAL LOW (ref 8.9–10.3)
Creatinine, Ser: 7.58 mg/dL — ABNORMAL HIGH (ref 0.44–1.00)
GFR calc Af Amer: 6 mL/min — ABNORMAL LOW (ref >60)
GFR calc non Af Amer: 5 mL/min — ABNORMAL LOW (ref >60)
GLUCOSE: 99 mg/dL (ref 70–99)
Phosphorus: 5.7 mg/dL — ABNORMAL HIGH (ref 2.5–4.6)
Potassium: 4.2 mmol/L (ref 3.5–5.1)
Sodium: 136 mmol/L (ref 135–145)

## 2018-05-19 LAB — IRON AND TIBC: Iron: 14 ug/dL — ABNORMAL LOW (ref 28–170)

## 2018-05-19 MED ORDER — NEPRO/CARBSTEADY PO LIQD
237.0000 mL | Freq: Three times a day (TID) | ORAL | Status: DC
  Administered 2018-05-19: 237 mL via ORAL
  Filled 2018-05-19 (×3): qty 237

## 2018-05-19 MED ORDER — SENNOSIDES-DOCUSATE SODIUM 8.6-50 MG PO TABS
1.0000 | ORAL_TABLET | Freq: Two times a day (BID) | ORAL | Status: DC
  Administered 2018-05-19 – 2018-05-23 (×6): 1 via ORAL
  Filled 2018-05-19 (×7): qty 1

## 2018-05-19 MED ORDER — LIDOCAINE-PRILOCAINE 2.5-2.5 % EX CREA: 1.0000 "application " | TOPICAL_CREAM | CUTANEOUS | Status: DC | PRN

## 2018-05-19 MED ORDER — HEPARIN SODIUM (PORCINE) 1000 UNIT/ML DIALYSIS: 1000.0000 [IU] | INTRAMUSCULAR | Status: DC | PRN

## 2018-05-19 MED ORDER — POLYETHYLENE GLYCOL 3350 17 G PO PACK
17.0000 g | PACK | Freq: Two times a day (BID) | ORAL | Status: DC
  Administered 2018-05-19 – 2018-05-23 (×6): 17 g via ORAL
  Filled 2018-05-19 (×7): qty 1

## 2018-05-19 MED ORDER — HEPARIN SODIUM (PORCINE) 1000 UNIT/ML DIALYSIS: 100.0000 [IU]/kg | INTRAMUSCULAR | Status: DC | PRN

## 2018-05-19 MED ORDER — SODIUM CHLORIDE 0.9 % IV SOLN: 100.0000 mL | INTRAVENOUS | Status: DC | PRN

## 2018-05-19 MED ORDER — DARBEPOETIN ALFA 150 MCG/0.3ML IJ SOSY
PREFILLED_SYRINGE | INTRAMUSCULAR | Status: AC
  Filled 2018-05-19: qty 0.3

## 2018-05-19 MED ORDER — ENSURE ENLIVE PO LIQD: 237.0000 mL | Freq: Two times a day (BID) | ORAL | Status: DC

## 2018-05-19 MED ORDER — MIRTAZAPINE 15 MG PO TABS
7.5000 mg | ORAL_TABLET | Freq: Every day | ORAL | Status: DC
  Administered 2018-05-19: 7.5 mg via ORAL
  Filled 2018-05-19: qty 1

## 2018-05-19 MED ORDER — LIDOCAINE HCL (PF) 1 % IJ SOLN: 5.0000 mL | INTRAMUSCULAR | Status: DC | PRN

## 2018-05-19 MED ORDER — PENTAFLUOROPROP-TETRAFLUOROETH EX AERO: 1.0000 "application " | INHALATION_SPRAY | CUTANEOUS | Status: DC | PRN

## 2018-05-19 NOTE — Progress Notes (Signed)
Subjective: Interval History: has no complaints, knows she I s confused.  Objective: Vital signs in last 24 hours: Temp:  [97.6 F (36.4 C)-99 F (37.2 C)] 98 F (36.7 C) (10/18 0728) Pulse Rate:  [99-124] 113 (10/18 0830) Resp:  [13-18] 16 (10/18 0728) BP: (86-131)/(51-69) 107/58 (10/18 0830) SpO2:  [97 %-100 %] 100 % (10/18 0728) Weight:  [66.7 kg] 66.7 kg (10/18 0728) Weight change:   Intake/Output from previous day: 10/17 0701 - 10/18 0700 In: 290 [P.O.:240; IV Piggyback:50] Out: -  Intake/Output this shift: No intake/output data recorded.  General appearance: alert and confused OX1 Resp: clear to auscultation bilaterally Cardio: S1, S2 normal and systolic murmur: systolic ejection 2/6, crescendo and decrescendo at 2nd left intercostal space GI: soft, non-tender; bowel sounds normal; no masses,  no organomegaly Extremities: dressing R thigh,. AVG RUA  Lab Results: Recent Labs    05/18/18 0246 05/19/18 0116  WBC 8.0 8.6  HGB 8.5* 7.6*  HCT 28.3* 24.5*  PLT 233 228   BMET:  Recent Labs    05/18/18 0246 05/19/18 0657  NA 136 136  K 4.1 4.2  CL 98 96*  CO2 23 25  GLUCOSE 110* 99  BUN 23 44*  CREATININE 5.97* 7.58*  CALCIUM 7.6* 8.0*   Recent Labs    05/17/18 0820  PTH 851*  Comment   Iron Studies: No results for input(s): IRON, TIBC, TRANSFERRIN, FERRITIN in the last 72 hours.  Studies/Results: Dg C-arm 1-60 Min  Result Date: 05/17/2018 CLINICAL DATA:  ORIF right femur fracture EXAM: DG C-ARM 61-120 MIN; RIGHT FEMUR 2 VIEWS FLUOROSCOPY TIME:  1 minutes, 44 seconds COMPARISON:  Right femur radiographs-05/16/2018 FINDINGS: Seven spot intraoperative fluoroscopic images the distal aspect the right femur are provided for review Initial images demonstrate known comminuted displaced distal femur fracture. Subsequent images demonstrate sideplate fixation of the femur fracture with multiple transfixing cancellous screws. Improved alignment of the fracture  fragments with minimal residual displacement. Expected adjacent subcutaneous emphysema. No radiopaque foreign body. IMPRESSION: Post sideplate fixation of comminuted distal femur fracture without evidence of complication. Electronically Signed   By: Sandi Mariscal M.D.   On: 05/17/2018 15:57   Dg Femur, Min 2 Views Right  Result Date: 05/17/2018 CLINICAL DATA:  ORIF right femur fracture EXAM: DG C-ARM 61-120 MIN; RIGHT FEMUR 2 VIEWS FLUOROSCOPY TIME:  1 minutes, 44 seconds COMPARISON:  Right femur radiographs-05/16/2018 FINDINGS: Seven spot intraoperative fluoroscopic images the distal aspect the right femur are provided for review Initial images demonstrate known comminuted displaced distal femur fracture. Subsequent images demonstrate sideplate fixation of the femur fracture with multiple transfixing cancellous screws. Improved alignment of the fracture fragments with minimal residual displacement. Expected adjacent subcutaneous emphysema. No radiopaque foreign body. IMPRESSION: Post sideplate fixation of comminuted distal femur fracture without evidence of complication. Electronically Signed   By: Sandi Mariscal M.D.   On: 05/17/2018 15:57   Dg Femur Port, Min 2 Views Right  Result Date: 05/17/2018 CLINICAL DATA:  Postop femur fracture EXAM: RIGHT FEMUR PORTABLE 2 VIEW COMPARISON:  05/17/2018, 05/16/2018 FINDINGS: Vascular calcifications. Right femoral head projects in joint. Interval surgical plate and multiple screw fixation of the right femur for comminuted distal femoral fracture. Decreased displacement of fracture fragments. Gas in the soft tissues consistent with recent surgery. IMPRESSION: Interval surgical plate and multiple screw fixation of comminuted distal femoral fracture with decreased displacement and angulation. Electronically Signed   By: Donavan Foil M.D.   On: 05/17/2018 21:09    I have  reviewed the patient's current medications.  Assessment/Plan: 1 ESRD for HD.   2 HTN lower bp 3  Anemia post op.  Esa, check Fe 4 HPTH check 5 confusion. Meds, ? Some baseline issues 6 ICB 7 CVA 8 Hx uterine CA 9 Femur Fx P HD, esa, check Fe , PTH, limit CNS active meds.    LOS: 3 days   Jeneen Rinks Kieana Livesay 05/19/2018,8:52 AM

## 2018-05-19 NOTE — Progress Notes (Signed)
PROGRESS NOTE  Sonya [name] BSJ:[id number] DOB: 1951/03/16 DOA: 05/16/2018 PCP: Redmond School, MD  HPI/Recap of past 24 hours: Sonya [name] is a 67 y.o. female with medical history of anemia of CKD, anxiety, ESRD on hemodialysis Tuesday/Thursday/Saturday, diverticulosis/diverticulitis, history of dysphagia, history of fracture of left femoral neck, GERD, hypertension, hyperparathyroidism due to renal insufficiency, hypothyroidism, intracerebral hemorrhage, meningocele, protein calorie malnutrition, ischemic stroke, history of uterine cancer presenting after mechanical fall going to the bathroom.  The patient states that she was using her walker.  She was trying to get up off the commode when her right leg "gave out", and the patient fell onto her right side.  The patient had extreme pain and was unable to bear any weight.  EMS was activated.  In the emergency department, x-rays of the hip revealed an acute comminuted fracture of the right femoral diaphysis with half shaft width displacement. Pt admitted for further management.   Today, saw pt after HD, denies any new complaints. Looks comfortable.  Assessment/Plan: Active Problems:   Essential hypertension   Hyperparathyroidism due to renal insufficiency (HCC)   Hypothyroidism, adult   Hyperlipidemia   End stage renal failure on dialysis [facility])   Fracture of condyle of right femur, closed, initial encounter ([place])   Closed fracture of right femur, unspecified fracture morphology, initial encounter ([place])  Acute comminuted fracture of the right femur s/p ORIF on 05/17/18 Orthopedics on board Pain management and DVT ppx as per ortho PT eval after surgery  ESRD Nephrology on board HD as per nephrology  Secondary hyperparathyroidism Management per nephrology  Essential hypertension BP soft Held nifedipine  Anemia of CKD/Acute blood loss post op Baseline hemoglobin 11-12 Hemoglobin 7.6, no signs of bleeding Type and  screen done Monitor closely, daily cbc  History of ischemic stroke Continue ASA Held plavix due to drop in hemoglobin Continue statin  Hyperlipidemia Continue statin--increased atorvastatin to40 mg daily  Hypothyroidism Continue synthroid    Code Status: Full   Family Communication: Daughter, family friends at bedside  Disposition Plan: SNF   Consultants:  Orthopedics  Procedures:  ORIF on 05/17/18  Antimicrobials:  None  DVT prophylaxis: Heparin   Objective: Vitals:   05/19/18 1130 05/19/18 1141 05/19/18 1205 05/19/18 1506  BP: 128/66 (!) 145/75 (!) 116/59 101/63  Pulse: 92 100 (!) 103 (!) 115  Resp:  16 16 16   Temp:  98.1 F (36.7 C)  98.4 F (36.9 C)  TempSrc:  Oral  Oral  SpO2:  100% 100% 99%  Weight:  65.5 kg    Height:        Intake/Output Summary (Last 24 hours) at 05/19/2018 1508 Last data filed at 05/19/2018 1141 Gross per 24 hour  Intake 50 ml  Output 1154 ml  Net -1104 ml   Filed Weights   05/17/18 1153 05/19/18 0728 05/19/18 1141  Weight: 64.4 kg 66.7 kg 65.5 kg    Exam:   General: NAD, appears older than stated age  Cardiovascular: S1, S2 present  Respiratory: CTAB  Abdomen: Soft, NT, ND, BS present  Musculoskeletal: RLE wrapped in ACE c/d/i, LLE no pedal edema noted  Skin: Normal  Psychiatry: Normal mood   Data Reviewed: CBC: Recent Labs  Lab 05/16/18 0620 05/17/18 0320 05/18/18 0246 05/19/18 0116  WBC 4.9 5.8 8.0 8.6  NEUTROABS 3.5  --  6.4 6.7  HGB 11.2* 10.1* 8.5* 7.6*  HCT 37.5 34.3* 28.3* 24.5*  MCV 83.7 84.5 83.5 81.4  PLT 275 272 233 228  Basic Metabolic Panel: Recent Labs  Lab 05/16/18 1702 05/17/18 0740 05/17/18 0820 05/17/18 1840 05/18/18 0246 05/19/18 0657  NA 137 137  --  140 136 136  K 4.5 4.7  --  4.6 4.1 4.2  CL 101 101  --  102 98 96*  CO2 24 22  --  21* 23 25  GLUCOSE 101* 91  --  106* 110* 99  BUN 42* 50*  --  19 23 44*  CREATININE 10.30* 11.17*  --  5.43* 5.97* 7.58*   CALCIUM 8.1* 7.5* 7.8* 7.8* 7.6* 8.0*  PHOS 4.6 5.6*  --  5.6*  --  5.7*   GFR: Estimated Creatinine Clearance: 6.4 mL/min (A) (by C-G formula based on SCr of 7.58 mg/dL (H)). Liver Function Tests: Recent Labs  Lab 05/16/18 0620 05/16/18 1702 05/17/18 0740 05/17/18 1840 05/19/18 0657  AST 13*  --   --   --   --   ALT 8  --   --   --   --   ALKPHOS 92  --   --   --   --   BILITOT 0.7  --   --   --   --   PROT 7.8  --   --   --   --   ALBUMIN 3.6 3.2* 2.9* 2.9* 2.7*   No results for input(s): LIPASE, AMYLASE in the last 168 hours. No results for input(s): AMMONIA in the last 168 hours. Coagulation Profile: Recent Labs  Lab 05/16/18 0620 05/17/18 0320  INR 1.20 1.32   Cardiac Enzymes: No results for input(s): CKTOTAL, CKMB, CKMBINDEX, TROPONINI in the last 168 hours. BNP (last 3 results) No results for input(s): PROBNP in the last 8760 hours. HbA1C: No results for input(s): HGBA1C in the last 72 hours. CBG: No results for input(s): GLUCAP in the last 168 hours. Lipid Profile: No results for input(s): CHOL, HDL, LDLCALC, TRIG, CHOLHDL, LDLDIRECT in the last 72 hours. Thyroid Function Tests: No results for input(s): TSH, T4TOTAL, FREET4, T3FREE, THYROIDAB in the last 72 hours. Anemia Panel: Recent Labs    05/19/18 0713  TIBC NOT CALCULATED  IRON 14*   Urine analysis:    Component Value Date/Time   COLORURINE YELLOW 04/14/2015 1427   APPEARANCEUR CLEAR 04/14/2015 1427   LABSPEC 1.020 04/14/2015 1427   PHURINE 8.5 (H) 04/14/2015 1427   GLUCOSEU 100 (A) 04/14/2015 1427   HGBUR SMALL (A) 04/14/2015 1427   BILIRUBINUR NEGATIVE 04/14/2015 1427   KETONESUR NEGATIVE 04/14/2015 1427   PROTEINUR >300 (A) 04/14/2015 1427   UROBILINOGEN 0.2 04/14/2015 1427   NITRITE NEGATIVE 04/14/2015 1427   LEUKOCYTESUR TRACE (A) 04/14/2015 1427   Sepsis Labs: @LABRCNTIP (procalcitonin:4,lacticidven:4)  ) Recent Results (from the past 240 hour(s))  Surgical PCR screen      Status: None   Collection Time: 05/16/18  6:11 PM  Result Value Ref Range Status   MRSA, PCR NEGATIVE NEGATIVE Final   Staphylococcus aureus NEGATIVE NEGATIVE Final    Comment: (NOTE) The Xpert SA Assay (FDA approved for NASAL specimens in patients 76 years of age and older), is one component of a comprehensive surveillance program. It is not intended to diagnose infection nor to guide or monitor treatment. Performed at [facility], [place] [address], [place], [place] [address]       Studies: No results found.  Scheduled Meds: . allopurinol  100 mg Oral Q Wed  . aspirin EC  81 mg Oral Daily  . atorvastatin  40 mg  Oral q1800  . calcium acetate  667 mg Oral TID WC  . Chlorhexidine Gluconate Cloth  6 each Topical Q0600  . clopidogrel  75 mg Oral Daily  . darbepoetin (ARANESP) injection - DIALYSIS  150 mcg Intravenous Q Fri-HD  . gabapentin  100 mg Oral QHS  . heparin  5,000 Units Subcutaneous Q8H  . levothyroxine  50 mcg Oral QAC breakfast  . lidocaine-prilocaine  1 application Topical Q T,UY,[id number]  . multivitamin  1 tablet Oral QHS  . pentoxifylline  400 mg Oral TID WC  . polyethylene glycol  17 g Oral BID  . senna-docusate  1 tablet Oral BID    Continuous Infusions:   LOS: 3 days     Sonya Friendly, MD [facility]   If 7PM-7AM, please contact night-coverage [email or web address] 05/19/2018, 3:08 PM

## 2018-05-19 NOTE — Care Management Note (Signed)
Case Management Note  Patient Details  Name: Sonya [name] MRN: [id number] Date of Birth: 26-May-1951  Subjective/Objective:   Acute comminuted fracture of the right femur s/p ORIF on 05/17/18                 Action/Plan: NCM spoke to pt and dtr, Freda at bedside. Requesting SNF rehab. CSW referral for SNF.   Expected Discharge Date:                  Expected Discharge Plan:  Skilled Nursing Facility  In-House Referral:  Clinical Social Work  Discharge planning Services  CM Consult  Post Acute Care Choice:  NA Choice offered to:  NA  DME Arranged:  N/A DME Agency:  NA  HH Arranged:  NA HH Agency:  NA  Status of Service:  Completed, signed off  If discussed at [place] of Stay Meetings, dates discussed:    Additional Comments:  Erenest Rasher, RN 05/19/2018, 5:13 PM

## 2018-05-19 NOTE — Progress Notes (Signed)
Physical Therapy Cancellation Note   05/19/18 9787  PT Visit Information  Last PT Received On 05/19/18  Reason Eval/Treat Not Completed Patient at procedure or test/unavailable (Pt in HD. PT will continue to follow acutely.)   Earney Navy, PTA Acute Rehabilitation Services Pager: [phone] Office: [phone]

## 2018-05-19 NOTE — Progress Notes (Signed)
Pt transported to Dialysis ?

## 2018-05-19 NOTE — Progress Notes (Signed)
Orthopaedic Trauma Progress Note  S: Still confused. Pain better.  O:  Vitals:   05/19/18 1506 05/19/18 1947  BP: 101/63 120/72  Pulse: (!) 115 (!) 109  Resp: 16   Temp: 98.4 F (36.9 C) 98.7 F (37.1 C)  SpO2: 99% 100%    Gen: NAD, Awake and alert RLE: Incisions clean, dry and intact. Compartments soft and compressible. Warm and well perfused foot. Moves ankle and toes. Endorses sensation to foot.  Imaging: Stable postop imaging  Labs:  Results for orders placed or performed during the hospital encounter of 05/16/18 (from the past 24 hour(s))  CBC with Differential/Platelet     Status: Abnormal   Collection Time: 05/19/18  1:16 AM  Result Value Ref Range   WBC 8.6 4.0 - 10.5 K/uL   RBC 3.01 (L) 3.87 - 5.11 MIL/uL   Hemoglobin 7.6 (L) 12.0 - 15.0 g/dL   HCT 24.5 (L) 36.0 - 46.0 %   MCV 81.4 80.0 - 100.0 fL   MCH 25.2 (L) 26.0 - 34.0 pg   MCHC 31.0 30.0 - 36.0 g/dL   RDW 15.7 (H) 11.5 - 15.5 %   Platelets 228 150 - 400 K/uL   nRBC 0.0 0.0 - 0.2 %   Neutrophils Relative % 79 %   Neutro Abs 6.7 1.7 - 7.7 K/uL   Lymphocytes Relative 10 %   Lymphs Abs 0.8 0.7 - 4.0 K/uL   Monocytes Relative 11 %   Monocytes Absolute 0.9 0.1 - 1.0 K/uL   Eosinophils Relative 0 %   Eosinophils Absolute 0.0 0.0 - 0.5 K/uL   Basophils Relative 0 %   Basophils Absolute 0.0 0.0 - 0.1 K/uL   Immature Granulocytes 0 %   Abs Immature Granulocytes 0.03 0.00 - 0.07 K/uL  Renal function panel     Status: Abnormal   Collection Time: 05/19/18  6:57 AM  Result Value Ref Range   Sodium 136 135 - 145 mmol/L   Potassium 4.2 3.5 - 5.1 mmol/L   Chloride 96 (L) 98 - 111 mmol/L   CO2 25 22 - 32 mmol/L   Glucose, Bld 99 70 - 99 mg/dL   BUN 44 (H) 8 - 23 mg/dL   Creatinine, Ser 7.58 (H) 0.44 - 1.00 mg/dL   Calcium 8.0 (L) 8.9 - 10.3 mg/dL   Phosphorus 5.7 (H) 2.5 - 4.6 mg/dL   Albumin 2.7 (L) 3.5 - 5.0 g/dL   GFR calc non Af Amer 5 (L) >60 mL/min   GFR calc Af Amer 6 (L) >60 mL/min   Anion gap 15 5  - 15  Iron and TIBC     Status: Abnormal   Collection Time: 05/19/18  7:13 AM  Result Value Ref Range   Iron 14 (L) 28 - 170 ug/dL   TIBC NOT CALCULATED 250 - 450 ug/dL   Saturation Ratios NOT CALCULATED 10.4 - 31.8 %   UIBC NOT CALCULATED ug/dL    Assessment: 67 year old female with a ground-level fall  Injuries: Right distal femur fracture status post ORIF  Weightbearing: Weightbearing as tolerated no range of motion restrictions  Insicional and dressing care: Dry dressing PRN  Orthopedic device(s): None needed  CV/Blood loss: Hemoglobin dropped from 10.1-->7.6.  Continue to monitor repeat hemoglobin tomorrow.  Pain management: 1.  Norco 1-2 tabs every 6 hours as needed 2.  Dilaudid 0.5 mg every 2 hours as needed  Would recommend limiting narcotic use in elderly patient to decrease risk of delirium  VTE prophylaxis: Heparin subcutaneous  every 8 hours due to renal disease while in house, okay to resume aspirin and plavix for VTE prophylaxis on discharge  ID: [id number] postoperative-completed  Foley/Lines: No foley, KVO IVF  Medical co-morbidities: 1. ESRD-dialysis per renal team 2. HTN-continue home medications 3. Stroke history-Okay to restart plavix from orthopaedic perspective  Impediments to Fracture Healing: ESRD and osteoporosis  Dispo: PT/OT eval, likely SNF  Follow - up plan: 2-3 weeks for suture removal and x-rays in office   Shona Needles, MD [facility] [phone] (phone)

## 2018-05-19 NOTE — Progress Notes (Signed)
Physical Therapy Treatment Patient Details Name: Sonya [name] MRN: [id number] DOB: 05/31/1951 Today's Date: 05/19/2018    History of Present Illness is a 67 y.o. female with medical history of  anemia of CKD, anxiety, ESRD on hemodialysis Tuesday/Thursday/Saturday, diverticulosis/diverticulitis, history of dysphagia, history of fracture of left femoral neck, GERD, hypertension, hyperparathyroidism due to renal insufficiency, hypothyroidism, intracerebral hemorrhage, meningocele, protein calorie malnutrition, ischemic stroke, history of uterine cancer presenting after mechanical fall going to the bathroom.  s/p 05/17/18 ORIF of condyle fx of R femur    PT Comments    Patient seen for mobility progression. Pt continues to be confused and with difficulty following cues. Pt requires +2 assistance for bed mobility and +1 assist for sitting balance EOB. Unable to progress to standing this session. Daughter present throughout. Continue to progress as tolerated with anticipated d/c to SNF for further skilled PT services.     Follow Up Recommendations  SNF     Equipment Recommendations  None recommended by PT    Recommendations for Other Services OT consult     Precautions / Restrictions Precautions Precautions: Fall Restrictions Weight Bearing Restrictions: Yes RLE Weight Bearing: Weight bearing as tolerated    Mobility  Bed Mobility Overal bed mobility: Needs Assistance Bed Mobility: Supine to Sit;Sit to Supine     Supine to sit: +2 for physical assistance;Total assist;HOB elevated Sit to supine: +2 for physical assistance;Total assist   General bed mobility comments: cues for sequencing and assistance required for all aspects of bed mobility  Transfers                 General transfer comment: deferred for patient/therapist safety; when attempting to stand pt is not participating/following cues and fatigued  Ambulation/Gait                 Stairs              Wheelchair Mobility    Modified Rankin (Stroke Patients Only)       Balance Overall balance assessment: Needs assistance Sitting-balance support: Feet supported;Bilateral upper extremity supported Sitting balance-Leahy Scale: Poor Sitting balance - Comments: requires min guard/min A for sitting balance EOB                                     Cognition Arousal/Alertness: Awake/alert Behavior During Therapy: Flat affect Overall Cognitive Status: Impaired/Different from baseline Area of Impairment: Orientation;Memory;Awareness;Following commands;Problem solving                 Orientation Level: Situation;Time   Memory: Decreased short-term memory Following Commands: Follows one step commands with increased time;Follows one step commands inconsistently   Awareness: Emergent Problem Solving: Slow processing;Decreased initiation;Difficulty sequencing;Requires verbal cues;Requires tactile cues General Comments: daughter present and reports pt is confused and pt answering uh huh to most questions asked      Exercises      General Comments General comments (skin integrity, edema, etc.): daughter present throughout; BP in sitting 90s/50s      Pertinent Vitals/Pain Pain Assessment: Faces Faces Pain Scale: Hurts whole lot Pain Location: R LE Pain Descriptors / Indicators: Grimacing;Guarding;Moaning Pain Intervention(s): Limited activity within patient's tolerance;Monitored during session;Repositioned    Home Living                      Prior Function            PT  Goals (current goals can now be found in the care plan section) Progress towards PT goals: Progressing toward goals    Frequency    Min 3X/week      PT Plan Current plan remains appropriate    Co-evaluation              AM-PAC PT "6 Clicks" Daily Activity  Outcome Measure  Difficulty turning over in bed (including adjusting bedclothes, sheets and  blankets)?: Unable Difficulty moving from lying on back to sitting on the side of the bed? : Unable Difficulty sitting down on and standing up from a chair with arms (e.g., wheelchair, bedside commode, etc,.)?: Unable Help needed moving to and from a bed to chair (including a wheelchair)?: Total Help needed walking in hospital room?: Total Help needed climbing 3-5 steps with a railing? : Total 6 Click Score: 6    End of Session Equipment Utilized During Treatment: Gait belt Activity Tolerance: Patient limited by pain;Patient limited by fatigue Patient left: in bed;with call bell/phone within reach;with bed alarm set;with family/visitor present Nurse Communication: Mobility status PT Visit Diagnosis: Unsteadiness on feet (R26.81);Other abnormalities of gait and mobility (R26.89);Muscle weakness (generalized) (M62.81);Difficulty in walking, not elsewhere classified (R26.2);Pain Pain - Right/Left: Right Pain - part of body: Leg     Time: 7092-9574 PT Time Calculation (min) (ACUTE ONLY): 24 min  Charges:  $Therapeutic Activity: 23-37 mins                     Earney Navy, PTA Acute Rehabilitation Services Pager: [phone] Office: [phone]     Darliss Cheney 05/19/2018, 4:42 PM

## 2018-05-19 NOTE — Procedures (Signed)
I was present at this session.  I have reviewed the session itself and made appropriate changes.  HD via RUA avg, unfortunately both needles up.  Did have some bleeding from access. bp ok.   Jeneen Rinks Saud Bail 10/18/20198:50 AM

## 2018-05-19 NOTE — Progress Notes (Signed)
Pt's daughter concerned about pt not eating much for the past couple of days and was asking about a feeding tube. RN told daughter that staff would encourage her to eat and drink and RN will let MD know. MD paged and new orders received. RN went back into room to inform daughter of MD orders but she had gone. RN will pass along to night shift RN in case she comes back in tonight or tomorrow.

## 2018-05-19 NOTE — Care Management Important Message (Signed)
Important Message  Patient Details  Name: Sonya [name] MRN: [id number] Date of Birth: 09-Jun-1951   Medicare Important Message Given:  Yes    Erenest Rasher, RN 05/19/2018, 5:12 PM

## 2018-05-20 ENCOUNTER — Inpatient Hospital Stay (HOSPITAL_COMMUNITY): Payer: Medicare Other

## 2018-05-20 LAB — CBC WITH DIFFERENTIAL/PLATELET
Abs Immature Granulocytes: 0.02 10*3/uL (ref 0.00–0.07)
BASOS ABS: 0 10*3/uL (ref 0.0–0.1)
BASOS PCT: 0 %
Eosinophils Absolute: 0 10*3/uL (ref 0.0–0.5)
Eosinophils Relative: 0 %
HCT: 22.4 % — ABNORMAL LOW (ref 36.0–46.0)
Hemoglobin: 6.9 g/dL — CL (ref 12.0–15.0)
Immature Granulocytes: 0 %
LYMPHS ABS: 1.6 10*3/uL (ref 0.7–4.0)
Lymphocytes Relative: 20 %
MCH: 24.9 pg — ABNORMAL LOW (ref 26.0–34.0)
MCHC: 30.8 g/dL (ref 30.0–36.0)
MCV: 80.9 fL (ref 80.0–100.0)
Monocytes Absolute: 0.9 10*3/uL (ref 0.1–1.0)
Monocytes Relative: 11 %
NEUTROS ABS: 5.3 10*3/uL (ref 1.7–7.7)
NEUTROS PCT: 69 %
NRBC: 0 % (ref 0.0–0.2)
PLATELETS: 241 10*3/uL (ref 150–400)
RBC: 2.77 MIL/uL — ABNORMAL LOW (ref 3.87–5.11)
RDW: 15.6 % — ABNORMAL HIGH (ref 11.5–15.5)
WBC: 7.8 10*3/uL (ref 4.0–10.5)

## 2018-05-20 LAB — HIV ANTIBODY (ROUTINE TESTING W REFLEX): HIV Screen 4th Generation wRfx: NONREACTIVE

## 2018-05-20 LAB — PTH, INTACT AND CALCIUM
CALCIUM TOTAL (PTH): 7.9 mg/dL — AB (ref 8.7–10.3)
PTH: 743 pg/mL — ABNORMAL HIGH (ref 15–65)

## 2018-05-20 LAB — PREPARE RBC (CROSSMATCH)

## 2018-05-20 MED ORDER — SODIUM CHLORIDE 0.9 % IV SOLN
125.0000 mg | INTRAVENOUS | Status: DC
  Administered 2018-05-22: 125 mg via INTRAVENOUS
  Filled 2018-05-20 (×3): qty 10

## 2018-05-20 MED ORDER — SODIUM CHLORIDE 0.9% IV SOLUTION
Freq: Once | INTRAVENOUS | Status: AC
  Administered 2018-05-20: 07:00:00 via INTRAVENOUS

## 2018-05-20 MED ORDER — CALCITRIOL 0.5 MCG PO CAPS
1.0000 ug | ORAL_CAPSULE | ORAL | Status: DC
  Administered 2018-05-22 – 2018-05-24 (×2): 1 ug via ORAL
  Filled 2018-05-20: qty 2

## 2018-05-20 MED ORDER — MEGESTROL ACETATE 40 MG PO TABS
40.0000 mg | ORAL_TABLET | Freq: Every day | ORAL | Status: DC
  Administered 2018-05-20 – 2018-05-24 (×5): 40 mg via ORAL
  Filled 2018-05-20 (×6): qty 1

## 2018-05-20 MED ORDER — BOOST / RESOURCE BREEZE PO LIQD CUSTOM
1.0000 | Freq: Three times a day (TID) | ORAL | Status: DC
  Administered 2018-05-21 – 2018-05-24 (×9): 1 via ORAL

## 2018-05-20 NOTE — Progress Notes (Signed)
Received a call for lab for a critical lab value for hemoglobin which was 6.9. Notified MD. Awaiting orders and/or response.

## 2018-05-20 NOTE — Progress Notes (Signed)
Subjective: Interval History: has complaints just doesn't feel good.  Objective: Vital signs in last 24 hours: Temp:  [98.1 F (36.7 C)-98.7 F (37.1 C)] 98.4 F (36.9 C) (10/19 0412) Pulse Rate:  [92-122] 122 (10/19 0412) Resp:  [16] 16 (10/18 1506) BP: (101-145)/(59-75) 134/74 (10/19 0412) SpO2:  [99 %-100 %] 100 % (10/19 0412) Weight:  [65.5 kg] 65.5 kg (10/18 1141) Weight change:   Intake/Output from previous day: 10/18 0701 - 10/19 0700 In: -  Out: 1154  Intake/Output this shift: No intake/output data recorded.  General appearance: cooperative, no distress and confused Resp: diminished breath sounds bilaterally Cardio: S1, S2 normal and systolic murmur: systolic ejection 2/6, crescendo and decrescendo at 2nd left intercostal space, 2nd M at apex Gr 2/6 GI: soft, mild tender Extremities: AVG RUA B&T, R thigh swollen.  Lab Results: Recent Labs    05/19/18 0116 05/20/18 0426  WBC 8.6 7.8  HGB 7.6* 6.9*  HCT 24.5* 22.4*  PLT 228 241   BMET:  Recent Labs    05/18/18 0246 05/19/18 0657 05/19/18 0713  NA 136 136  --   K 4.1 4.2  --   CL 98 96*  --   CO2 23 25  --   GLUCOSE 110* 99  --   BUN 23 44*  --   CREATININE 5.97* 7.58*  --   CALCIUM 7.6* 8.0* 7.9*   Recent Labs    05/19/18 0713  PTH 743*  Comment   Iron Studies:  Recent Labs    05/19/18 0713  IRON 14*  TIBC NOT CALCULATED    Studies/Results: No results found.  I have reviewed the patient's current medications.  Assessment/Plan: 1 ESRD stable 2 Anemia lower on esa, Fe low add iv Fe, to get TX 3 HPTH add vit D 4 HTN bp not requiring meds 5 Femur fx 6 Confusion ? Baseline issues P esa/Fe, TX per primary., HD MWF    LOS: 4 days   Jeneen Rinks Jvon Meroney 05/20/2018,9:32 AM

## 2018-05-20 NOTE — Progress Notes (Addendum)
PROGRESS NOTE  AALA RANSOM ELF:[id number] DOB: Jun 25, 1951 DOA: 05/16/2018 PCP: Redmond School, MD  HPI/Recap of past 24 hours: Sonya [name] is a 67 y.o. female with medical history of anemia of CKD, anxiety, ESRD on hemodialysis Tuesday/Thursday/Saturday, diverticulosis/diverticulitis, history of dysphagia, history of fracture of left femoral neck, GERD, hypertension, hyperparathyroidism due to renal insufficiency, hypothyroidism, intracerebral hemorrhage, meningocele, protein calorie malnutrition, ischemic stroke, history of uterine cancer presenting after mechanical fall going to the bathroom.  The patient states that she was using her walker.  She was trying to get up off the commode when her right leg "gave out", and the patient fell onto her right side.  The patient had extreme pain and was unable to bear any weight.  EMS was activated.  In the emergency department, x-rays of the hip revealed an acute comminuted fracture of the right femoral diaphysis with half shaft width displacement. Pt admitted for further management.   Today, pt noted to be more confused, alert, awake, oriented to DOB, keeps repeating herself. Very poor appetite. Denies any headache, chest pain, SOB, abdominal pain, fever/chills. Daughter at bedside.  Assessment/Plan: Active Problems:   Essential hypertension   Hyperparathyroidism due to renal insufficiency (HCC)   Hypothyroidism, adult   Hyperlipidemia   End stage renal failure on dialysis [facility])   Fracture of condyle of right femur, closed, initial encounter ([place])   Closed fracture of right femur, unspecified fracture morphology, initial encounter ([place])  Acute comminuted fracture of the right femur s/p ORIF on 05/17/18 Orthopedics on board Pain management and DVT ppx as per ortho PT eval after surgery  Acute metabolic encephalopathy ?? Underlying dementia, confused, no signs of infection Afebrile, no leukocytosis CT head unremarkable MRI  pending Monitor closely  ESRD Nephrology on board HD as per nephrology  Secondary hyperparathyroidism Management per nephrology  Essential hypertension BP stable Held nifedipine  Anemia of CKD/Acute blood loss post op Baseline hemoglobin 11-12 Hemoglobin 7.6-->6.9, no signs of bleeding Type and screen done, nephrology to decide if ok to transfuse Monitor closely, daily cbc  History of ischemic stroke Continue ASA Held plavix due to drop in hemoglobin Continue statin  Hyperlipidemia Continue statin--increased atorvastatin to40 mg daily  Hypothyroidism Continue synthroid  Poor appetite/?malnourished  Dietician on board, appreciate recs Started Megace as daughter stated it worked in the past D/C mirtazapine for now Supplements    Code Status: Full   Family Communication: Daughter at bedside  Disposition Plan: SNF   Consultants:  Orthopedics  Procedures:  ORIF on 05/17/18  Antimicrobials:  None  DVT prophylaxis: Held Heparin   Objective: Vitals:   05/20/18 0412 05/20/18 1237 05/20/18 1245 05/20/18 1315  BP: 134/74 135/64 136/62 132/62  Pulse: (!) 122 (!) 106 (!) 105 (!) 105  Resp:  18 16 18   Temp: 98.4 F (36.9 C) 99.3 F (37.4 C) 97.9 F (36.6 C) (!) 97.4 F (36.3 C)  TempSrc: Axillary Oral Axillary Axillary  SpO2: 100% 100% 100% 100%  Weight:      Height:        Intake/Output Summary (Last 24 hours) at 05/20/2018 1514 Last data filed at 05/20/2018 1245 Gross per 24 hour  Intake 450 ml  Output -  Net 450 ml   Filed Weights   05/17/18 1153 05/19/18 0728 05/19/18 1141  Weight: 64.4 kg 66.7 kg 65.5 kg    Exam:   General: NAD, appears older than stated age, confused  Cardiovascular: S1, S2 present  Respiratory: CTAB  Abdomen: Soft, NT,  ND, BS present  Musculoskeletal: RLE wrapped in ACE c/d/i, LLE no pedal edema noted  Skin: Normal  Psychiatry: Fair mood   Data Reviewed: CBC: Recent Labs  Lab  05/16/18 0620 05/17/18 0320 05/18/18 0246 05/19/18 0116 05/20/18 0426  WBC 4.9 5.8 8.0 8.6 7.8  NEUTROABS 3.5  --  6.4 6.7 5.3  HGB 11.2* 10.1* 8.5* 7.6* 6.9*  HCT 37.5 34.3* 28.3* 24.5* 22.4*  MCV 83.7 84.5 83.5 81.4 80.9  PLT 275 272 233 228 161   Basic Metabolic Panel: Recent Labs  Lab 05/16/18 1702 05/17/18 0740 05/17/18 0820 05/17/18 1840 05/18/18 0246 05/19/18 0657 05/19/18 0713  NA 137 137  --  140 136 136  --   K 4.5 4.7  --  4.6 4.1 4.2  --   CL 101 101  --  102 98 96*  --   CO2 24 22  --  21* 23 25  --   GLUCOSE 101* 91  --  106* 110* 99  --   BUN 42* 50*  --  19 23 44*  --   CREATININE 10.30* 11.17*  --  5.43* 5.97* 7.58*  --   CALCIUM 8.1* 7.5* 7.8* 7.8* 7.6* 8.0* 7.9*  PHOS 4.6 5.6*  --  5.6*  --  5.7*  --    GFR: Estimated Creatinine Clearance: 6.4 mL/min (A) (by C-G formula based on SCr of 7.58 mg/dL (H)). Liver Function Tests: Recent Labs  Lab 05/16/18 0620 05/16/18 1702 05/17/18 0740 05/17/18 1840 05/19/18 0657  AST 13*  --   --   --   --   ALT 8  --   --   --   --   ALKPHOS 92  --   --   --   --   BILITOT 0.7  --   --   --   --   PROT 7.8  --   --   --   --   ALBUMIN 3.6 3.2* 2.9* 2.9* 2.7*   No results for input(s): LIPASE, AMYLASE in the last 168 hours. No results for input(s): AMMONIA in the last 168 hours. Coagulation Profile: Recent Labs  Lab 05/16/18 0620 05/17/18 0320  INR 1.20 1.32   Cardiac Enzymes: No results for input(s): CKTOTAL, CKMB, CKMBINDEX, TROPONINI in the last 168 hours. BNP (last 3 results) No results for input(s): PROBNP in the last 8760 hours. HbA1C: No results for input(s): HGBA1C in the last 72 hours. CBG: No results for input(s): GLUCAP in the last 168 hours. Lipid Profile: No results for input(s): CHOL, HDL, LDLCALC, TRIG, CHOLHDL, LDLDIRECT in the last 72 hours. Thyroid Function Tests: No results for input(s): TSH, T4TOTAL, FREET4, T3FREE, THYROIDAB in the last 72 hours. Anemia Panel: Recent Labs     05/19/18 0713  TIBC NOT CALCULATED  IRON 14*   Urine analysis:    Component Value Date/Time   COLORURINE YELLOW 04/14/2015 1427   APPEARANCEUR CLEAR 04/14/2015 1427   LABSPEC 1.020 04/14/2015 1427   PHURINE 8.5 (H) 04/14/2015 1427   GLUCOSEU 100 (A) 04/14/2015 1427   HGBUR SMALL (A) 04/14/2015 1427   BILIRUBINUR NEGATIVE 04/14/2015 1427   KETONESUR NEGATIVE 04/14/2015 1427   PROTEINUR >300 (A) 04/14/2015 1427   UROBILINOGEN 0.2 04/14/2015 1427   NITRITE NEGATIVE 04/14/2015 1427   LEUKOCYTESUR TRACE (A) 04/14/2015 1427   Sepsis Labs: @LABRCNTIP (procalcitonin:4,lacticidven:4)  ) Recent Results (from the past 240 hour(s))  Surgical PCR screen     Status: None   Collection Time: 05/16/18  6:11 PM  Result Value Ref Range Status   MRSA, PCR NEGATIVE NEGATIVE Final   Staphylococcus aureus NEGATIVE NEGATIVE Final    Comment: (NOTE) The Xpert SA Assay (FDA approved for NASAL specimens in patients 20 years of age and older), is one component of a comprehensive surveillance program. It is not intended to diagnose infection nor to guide or monitor treatment. Performed at [facility], [place] [address], [place], [place] [address]       Studies: Ct Head Wo Contrast  Result Date: 05/20/2018 CLINICAL DATA:  Altered mental status. EXAM: CT HEAD WITHOUT CONTRAST TECHNIQUE: Contiguous axial images were obtained from the base of the skull through the vertex without intravenous contrast. COMPARISON:  MRI brain 09/26/2017. FINDINGS: Brain: Moderate atrophy and white matter disease is similar the prior exam. There is volume loss in the medial right temporal lobe. Although the patient had a recent infarct, there has been chronic loss of volume on the right. Chronic ischemic changes extend into the brainstem. No acute hemorrhage or mass lesion is present. No acute infarcts are evident. The ventricles are proportionate to the degree of atrophy. No significant extra-axial fluid collection is  present. Vascular: Atherosclerotic calcifications are present within the cavernous internal carotid arteries bilaterally and at the dural margin of both vertebral arteries. There is no hyperdense vessel. Skull: Calvarium is intact. No focal lytic or blastic lesions are present. Hyperostosis is present. No significant extracranial soft tissue lesion is noted. Sinuses/Orbits: There is chronic opacification of the left sphenoid sinus with a known meningoencephalocele. The paranasal sinuses and mastoid air cells are otherwise clear. Globes and orbits are within normal limits. IMPRESSION: 1. No acute intracranial abnormality or significant interval change. 2. Stable Vance atrophy and white matter disease. This likely reflects the sequela of chronic microvascular ischemia. 3. Asymmetric volume loss and remote ischemia involving the medial right temporal lobe. 4. Chronic left sphenoid sinus opacification. This represents a known meningoencephalocele . Electronically Signed   By: San Morelle M.D.   On: 05/20/2018 12:43    Scheduled Meds: . allopurinol  100 mg Oral Q Wed  . aspirin EC  81 mg Oral Daily  . atorvastatin  40 mg Oral q1800  . [START ON 05/22/2018] calcitRIOL  1 mcg Oral Q M,W,F-HD  . calcium acetate  667 mg Oral TID WC  . Chlorhexidine Gluconate Cloth  6 each Topical Q0600  . darbepoetin (ARANESP) injection - DIALYSIS  150 mcg Intravenous Q Fri-HD  . feeding supplement  1 Container Oral TID BM  . gabapentin  100 mg Oral QHS  . heparin  5,000 Units Subcutaneous Q8H  . levothyroxine  50 mcg Oral QAC breakfast  . lidocaine-prilocaine  1 application Topical Q H,CW,CBJ-6283  . mirtazapine  7.5 mg Oral QHS  . multivitamin  1 tablet Oral QHS  . pentoxifylline  400 mg Oral TID WC  . polyethylene glycol  17 g Oral BID  . senna-docusate  1 tablet Oral BID    Continuous Infusions: . [START ON 05/22/2018] ferric gluconate (FERRLECIT/NULECIT) IV       LOS: 4 days     Alma Friendly, MD [facility]   If 7PM-7AM, please contact night-coverage [email or web address] 05/20/2018, 3:14 PM

## 2018-05-20 NOTE — Plan of Care (Signed)
  Problem: Clinical Measurements: Goal: Ability to maintain clinical measurements within normal limits will improve Outcome: Progressing Goal: Will remain free from infection Outcome: Progressing   Problem: Safety: Goal: Ability to remain free from injury will improve Outcome: Progressing   Problem: Skin Integrity: Goal: Risk for impaired skin integrity will decrease Outcome: Progressing   Problem: Education: Goal: Knowledge of General Education information will improve Description Including pain rating scale, medication(s)/side effects and non-pharmacologic comfort measures Outcome: Not Progressing   Problem: Nutrition: Goal: Adequate nutrition will be maintained Outcome: Not Progressing Note:  Pt encouraged to increase oral intake.

## 2018-05-20 NOTE — Progress Notes (Signed)
Patient completed blood transfusion at 2100. Pt refused VS and meds. Going for MRI of brain as ordered. [facility] 1 notified (Dr Threasa Alpha)

## 2018-05-20 NOTE — Progress Notes (Signed)
Nutrition Follow-up  DOCUMENTATION CODES:  Non-severe (moderate) malnutrition in context of chronic illness (ESRD on HD) as evidenced by moderate muscle/fat loss  INTERVENTION:  Recommend D/Cing consistent carbohydrate restrictions have be 90-110  Boost Breeze po TID, each supplement provides 250 kcal and 9 grams of protein  Daughter reports patient had immense success w/ megestrol acetate for appetite stimulation in past  Please note-daughter reports patient not taking phos binder at home  NUTRITION DIAGNOSIS:  Inadequate oral intake related to lethargy/confusion as evidenced by energy intake < or equal to 50% for > or equal to 5 days.  GOAL:  Patient will meet greater than or equal to 90% of their needs  MONITOR:  PO intake, Supplement acceptance, Diet advancement, Labs, Weight trends, I & O's  REASON FOR ASSESSMENT:  Consult Assessment of nutrition requirement/status, Poor PO intake  ASSESSMENT:  67 yo female with PMH of HTN, hypothyroidism, uterine cancer, anemia, CKD, dysphagia, GERD with stricture, ESRD on HD, and malnutrition who was admitted on 10/15 with R hip fx.   RD consulted for poor intake. Per records, the only documented meal intakes since admission are: 25-50% of 2 meals Thursday and 75% of breakfast this morning.    Pt is lethargic/slow to respond. She does endorse she is eating poorly. When RD asked why, she replied "I just dont know". Her daughter is at bedside and reports pt's current mental state is far from baseline. She apparently has had this lethargy/confusion since surgery. Daughter cites this as reason patient is eating poorly. She says that even if the pt didn't like the food, if she was in her right mind she would still force herself to eat because she knows she needs the nutrition. Daughter reports pt's last full meal was dinner Wednesday. She then had "handfuls of food" Thursday. She says she has not ate well since.   At baseline, patient is  apparently quite independent. Performs all ADLs on own. She typically eats fairly well ( eats 2-3 meals each day). Pt has been on HD for 4-5 year and Daughter reports pt has done fairly well with it other than fatigue the day after each session. She says pt does not struggle w/ n/v/d. She does have constipation and takes miralax for this. She does not drink supplements (doesn't like them at all) or take any vitamins. Daughter notes the patient does try to follow a renal diet. She does not check BG because DM well managed. Daughter notes pt had been placed on megace early this year d/t poor appetite. She did so well on this medication that nephrologist asked pt to stop this a few months ago as she was "gaining too much weight"  This report is consistent with wt history. Pt was 114 lbs this past Feb and 118 this past MArch. She had gained over the course of this year and actually was admitted this admission at weight of 146.4 lbs- a gain 30 lbs x8 months. Todays bed wt is 145.4 lbs. Driest wt currently 144.4 lbs. In distant past, pt actually was >190 lbs 5 years ago  At this time, RD limited in what he can provide as it appears mental state is limiting factory-daughter did not believe diet liberalization would have any impact when she is this confused. Daughter did not pt liked the breeze supplement-will order this.   Labs: Hgb: 6.9, Albumin: 2.7, Phos: 5.7, BGs 90-110 Meds: Calcitriol, Remeron started last night, phoslo, Nepro (refusing), renal mvi,   NUTRITION - FOCUSED PHYSICAL  EXAM:   Most Recent Value  Orbital Region  Moderate depletion  Upper Arm Region  Mild depletion  Thoracic and Lumbar Region  Moderate depletion  Buccal Region  Moderate depletion  Temple Region  Moderate depletion  Clavicle Bone Region  Mild depletion  Clavicle and Acromion Bone Region  Mild depletion  Scapular Bone Region  Unable to assess  Dorsal Hand  Mild depletion  Patellar Region  No depletion  Anterior Thigh  Region  No depletion  Posterior Calf Region  No depletion  Hair  Reviewed  Eyes  Reviewed  Mouth  Reviewed  Skin  Reviewed  Nails  Reviewed     Diet Order:   Diet Order            Diet renal/carb modified with fluid restriction Diet-HS Snack? Nothing; Fluid restriction: 1200 mL Fluid; Room service appropriate? Yes; Fluid consistency: Thin  Diet effective now             EDUCATION NEEDS:  No education needs have been identified at this time  Skin:  Skin Assessment: Skin Integrity Issues: Skin Integrity Issues:: Incisions Incisions: Surgical incision to R leg  Last BM:  10/15  Height:   Ht Readings from Last 1 Encounters:  05/16/18 5\' 5"  (1.651 m)   Weight:  Wt Readings from Last 1 Encounters:  05/19/18 65.5 kg   Wt Readings from Last 10 Encounters:  05/19/18 65.5 kg  05/02/18 61.2 kg  04/05/18 61.2 kg  03/28/18 66.7 kg  12/09/17 61.7 kg  10/12/17 61.7 kg  10/02/17 49.9 kg  09/18/17 68 kg  07/06/17 61.2 kg  02/03/17 61.8 kg   Ideal Body Weight:  56.8 kg  BMI:  Body mass index is 24.03 kg/m.  Estimated Nutritional Needs:  Kcal:  1950-2150 kcals (30-33 kcal/kg bw) Protein:  85-100g Pro (1.3-1.5g/kg bw) Fluid:  <1.2 L fluid restricition  Burtis Junes RD, LDN, CNSC Clinical Nutrition Available Tues-Sat via Pager: [phone] 05/20/2018 11:23 AM

## 2018-05-21 DIAGNOSIS — I639 Cerebral infarction, unspecified: Secondary | ICD-10-CM

## 2018-05-21 DIAGNOSIS — S7291XA Unspecified fracture of right femur, initial encounter for closed fracture: Secondary | ICD-10-CM

## 2018-05-21 LAB — RENAL FUNCTION PANEL
ANION GAP: 14 (ref 5–15)
Albumin: 2.5 g/dL — ABNORMAL LOW (ref 3.5–5.0)
BUN: 48 mg/dL — ABNORMAL HIGH (ref 8–23)
CHLORIDE: 99 mmol/L (ref 98–111)
CO2: 25 mmol/L (ref 22–32)
Calcium: 8.5 mg/dL — ABNORMAL LOW (ref 8.9–10.3)
Creatinine, Ser: 6.56 mg/dL — ABNORMAL HIGH (ref 0.44–1.00)
GFR calc Af Amer: 7 mL/min — ABNORMAL LOW (ref >60)
GFR calc non Af Amer: 6 mL/min — ABNORMAL LOW (ref >60)
GLUCOSE: 81 mg/dL (ref 70–99)
POTASSIUM: 3.8 mmol/L (ref 3.5–5.1)
Phosphorus: 3.8 mg/dL (ref 2.5–4.6)
SODIUM: 138 mmol/L (ref 135–145)

## 2018-05-21 LAB — BPAM RBC
Blood Product Expiration Date: 201911122359
Blood Product Expiration Date: 201911162359
ISSUE DATE / TIME: 201910191252
ISSUE DATE / TIME: 201910191732
UNIT TYPE AND RH: 7300
Unit Type and Rh: 7300

## 2018-05-21 LAB — CBC WITH DIFFERENTIAL/PLATELET
Abs Immature Granulocytes: 0.05 10*3/uL (ref 0.00–0.07)
Basophils Absolute: 0 10*3/uL (ref 0.0–0.1)
Basophils Relative: 1 %
EOS PCT: 1 %
Eosinophils Absolute: 0.1 10*3/uL (ref 0.0–0.5)
HEMATOCRIT: 30.1 % — AB (ref 36.0–46.0)
HEMOGLOBIN: 9.7 g/dL — AB (ref 12.0–15.0)
Immature Granulocytes: 1 %
Lymphocytes Relative: 18 %
Lymphs Abs: 1.3 10*3/uL (ref 0.7–4.0)
MCH: 26.3 pg (ref 26.0–34.0)
MCHC: 32.2 g/dL (ref 30.0–36.0)
MCV: 81.6 fL (ref 80.0–100.0)
MONO ABS: 0.7 10*3/uL (ref 0.1–1.0)
MONOS PCT: 10 %
Neutro Abs: 5 10*3/uL (ref 1.7–7.7)
Neutrophils Relative %: 69 %
Platelets: 241 10*3/uL (ref 150–400)
RBC: 3.69 MIL/uL — AB (ref 3.87–5.11)
RDW: 15.6 % — ABNORMAL HIGH (ref 11.5–15.5)
WBC: 7.1 10*3/uL (ref 4.0–10.5)
nRBC: 0 % (ref 0.0–0.2)

## 2018-05-21 LAB — TYPE AND SCREEN
ABO/RH(D): B POS
Antibody Screen: NEGATIVE
Unit division: 0
Unit division: 0

## 2018-05-21 MED ORDER — CLOPIDOGREL BISULFATE 75 MG PO TABS
75.0000 mg | ORAL_TABLET | Freq: Every day | ORAL | Status: DC
  Administered 2018-05-21 – 2018-05-24 (×4): 75 mg via ORAL
  Filled 2018-05-21 (×4): qty 1

## 2018-05-21 MED ORDER — LABETALOL HCL 5 MG/ML IV SOLN
5.0000 mg | INTRAVENOUS | Status: DC | PRN
  Filled 2018-05-21: qty 4

## 2018-05-21 MED ORDER — CHLORHEXIDINE GLUCONATE CLOTH 2 % EX PADS
6.0000 | MEDICATED_PAD | Freq: Every day | CUTANEOUS | Status: DC
  Administered 2018-05-22 – 2018-05-24 (×2): 6 via TOPICAL

## 2018-05-21 MED ORDER — HEPARIN SODIUM (PORCINE) 5000 UNIT/ML IJ SOLN
5000.0000 [IU] | Freq: Three times a day (TID) | INTRAMUSCULAR | Status: DC
  Administered 2018-05-21 – 2018-05-24 (×10): 5000 [IU] via SUBCUTANEOUS
  Filled 2018-05-21 (×10): qty 1

## 2018-05-21 NOTE — Consult Note (Addendum)
NEURO HOSPITALIST  CONSULT     Requesting Physician: Dr. Horris Latino    Chief Complaint: Mechanical fall  History obtained from:  Patient and daughter  HPI:                                                                                                                                         Sonya [name] is an 67 y.o. female  With PMH significant for stroke, ICH ( 2016), HTN, ESRD (on dialysis), uterine cancer (2012) who presented to [place] after a mechanical fall. Was transferred to [facility] for orthopedics. Neurology consulted for punctate stroke seen on MRI.   The patient states that on 10/15 she was using the bathroom, was trying to get up off the commode and her legs gave out. She reports that both legs felt weak, but she also states that she does not remember much from that day. She says " I was using the bathroom, tried to get up. [place] and needed help, so I told my daughter to call EMS.  Daughter stated that at that time she did not notice any facial droop, or slurred speech. After the ORIF 10/16 she noticed confusion, slurred speech and she has had trouble with fine motor movements with her left hand (left hand is dominant) that has gotten better. She reports that her mother had not had any medications for 2 days d/t the anticipation of surgery.  Hospital  course:  Transferred from [facility] for orthopedics. MRI: multiple small foci acute ischemia. 1. In right parietal lobe and 2-3 in right cerebellum. CT head: no hemorrhage. 05/17/18: ORIF of right distal femur  Stroke history:  09/2017: patient presented to [facility] for [place] and was found to have CVA on MRI. punctate focus of ischemia inferior medial right temporal lobe 10/2014: ICH, no residual deficits  Date last known well: Date: 05/17/2018 Time last known well: Unable to determine tPA Given: No: outside of window  Modified Rankin: Rankin Score=2   Past Medical History:   Diagnosis Date  . Anemia   . Anxiety   . CKD (chronic kidney disease)   . Diverticulitis   . Dysphagia   . ESRD (end stage renal disease) ([place])    On HD  . Fracture of femoral neck, left, closed ([place]) 05/30/2015  . Gastritis 06/2013 & 05/2015  . Gastroesophageal reflux disease with stricture 05/2015  . GERD (gastroesophageal reflux disease)   . HTN (hypertension)   . Hyperparathyroidism due to renal insufficiency ([place])   . Hypertensive urgency 06/2013 & 04/2015  . Hypothyroidism   . ICH (intracerebral hemorrhage) ([place]) 10/2014  . Meningocele (  [place])   . Protein calorie malnutrition ([place])   . Stroke ([place])   . Uterine cancer ([place]) 2012  . Vertical diplopia   . Vertigo     Past Surgical History:  Procedure Laterality Date  . ABDOMINAL AORTOGRAM N/A 04/05/2018   Procedure: ABDOMINAL AORTOGRAM;  Surgeon: Marty Heck, MD;  Location: [facility];  Service: Cardiovascular;  Laterality: N/A;  . ABDOMINAL HYSTERECTOMY    . APPENDECTOMY    . BASCILIC VEIN TRANSPOSITION Right 09/17/2013   Procedure: BRACHIAL VEIN TRANSPOSITION;  Surgeon: Conrad Pembroke, MD;  Location: [facility];  Service: Vascular;  Laterality: Right;  . BASCILIC VEIN TRANSPOSITION Right 10/29/2013   Procedure: RIGHT SECOND STAGE BRACHIAL VEIN TRANSPOSITION;  Surgeon: Conrad Menominee, MD;  Location: [facility];  Service: Vascular;  Laterality: Right;  . CESAREAN SECTION    . CHOLECYSTECTOMY    . COLONOSCOPY  2000   TICS, IH  . COLONOSCOPY  2003 NUR BRBPR D50 V6   DC/[place] TICS, IH  . COLONOSCOPY N/A 09/04/2013   SLF: Small ulcer in the descending colon, one colon polyp (tubular adenoma), large internal hemorrhoids, moderate diverticulosis. next tcs 10 years.  . ESOPHAGOGASTRODUODENOSCOPY N/A 05/23/2015   [facility]:URKY non-erosive gastritis/stricture at the gastroesophageal junction  . ESOPHAGOGASTRODUODENOSCOPY (EGD) WITH ESOPHAGEAL DILATION N/A 06/08/2013   Dr. Fields:Stricture at the gastroesophageal junction/MILD NON-erosive  gastritis (inflammation) was found in the gastric antrum; multiple biopsies/NO SOURCE FOR ANEMIA IDETIFIED-MOST LIKELY DUE TO ANEMIA OF CHRONIC DISEASE  . FLEXIBLE SIGMOIDOSCOPY N/A 01/02/2016   Dr. Oneida Alar: external hemorrhoids, diverticulosis, internal hemorrhoids, proximal rectum normal  . GIVENS CAPSULE STUDY N/A 12/03/2013   SLF: occasional gastric erosion. small bowel normal  . HIP PINNING,CANNULATED Left 05/31/2015   Procedure: CANNULATED HIP PINNING;  Surgeon: Leandrew Koyanagi, MD;  Location: [place];  Service: Orthopedics;  Laterality: Left;  . INSERTION OF DIALYSIS CATHETER Left 09/17/2013   Procedure: INSERTION OF DIALYSIS CATHETER;  Surgeon: Conrad Martinsburg, MD;  Location: [facility];  Service: Vascular;  Laterality: Left;  . LAPAROSCOPIC TOTAL HYSTERECTOMY    . LOWER EXTREMITY ANGIOGRAPHY Bilateral 04/05/2018   Procedure: Lower Extremity Angiography;  Surgeon: Marty Heck, MD;  Location: [place] [facility];  Service: Cardiovascular;  Laterality: Bilateral;  . NASAL SEPTUM SURGERY    . ORIF FEMUR FRACTURE Right 05/17/2018   Procedure: OPEN REDUCTION INTERNAL FIXATION (ORIF) DISTAL FEMUR FRACTURE;  Surgeon: Shona Needles, MD;  Location: [place];  Service: Orthopedics;  Laterality: Right;  . PERIPHERAL VASCULAR ATHERECTOMY Right 04/05/2018   Procedure: PERIPHERAL VASCULAR ATHERECTOMY;  Surgeon: Marty Heck, MD;  Location: [facility];  Service: Cardiovascular;  Laterality: Right;  Peroneal   . PERIPHERAL VASCULAR BALLOON ANGIOPLASTY Right 04/05/2018   Procedure: PERIPHERAL VASCULAR BALLOON ANGIOPLASTY;  Surgeon: Marty Heck, MD;  Location: [facility];  Service: Cardiovascular;  Laterality: Right;  Posterior tibial  . SAVORY DILATION N/A 05/23/2015   Procedure: SAVORY DILATION;  Surgeon: Danie Binder, MD;  [facility];  Service: Endoscopy;  Laterality: N/A;  . THROMBECTOMY AND REVISION OF ARTERIOVENTOUS (AV) GORETEX  GRAFT Right 07/06/2017   Procedure:  THROMBECTOMY AND REVISION OF ARTERIOVENOUS (AV) GORETEX  GRAFT RIGHT UPPER ARM;  Surgeon: Rosetta Posner, MD;  Location: [facility];  Service: Vascular;  Laterality: Right;    Family History  Problem Relation Age of Onset  . Cancer Mother   . Diabetes Mother   . Hypertension Mother   . Diabetes  Father   . Hypertension Father   . Hypertension Sister   . Colon cancer Neg Hx       Social History:  reports that she has never smoked. She has never used smokeless tobacco. She reports that she does not drink alcohol or use drugs.  Allergies:  Allergies  Allergen Reactions  . Cephalexin Itching    Medications:                                                                                                                           Scheduled: . allopurinol  100 mg Oral Q Wed  . aspirin EC  81 mg Oral Daily  . atorvastatin  40 mg Oral q1800  . [START ON 05/22/2018] calcitRIOL  1 mcg Oral Q M,W,F-HD  . calcium acetate  667 mg Oral TID WC  . Chlorhexidine Gluconate Cloth  6 each Topical Q0600  . clopidogrel  75 mg Oral Daily  . darbepoetin (ARANESP) injection - DIALYSIS  150 mcg Intravenous Q Fri-HD  . feeding supplement  1 Container Oral TID BM  . gabapentin  100 mg Oral QHS  . levothyroxine  50 mcg Oral QAC breakfast  . lidocaine-prilocaine  1 application Topical Q N,LG,[id number]  . megestrol  40 mg Oral Daily  . multivitamin  1 tablet Oral QHS  . pentoxifylline  400 mg Oral TID WC  . polyethylene glycol  17 g Oral BID  . senna-docusate  1 tablet Oral BID   Continuous: . [START ON 05/22/2018] ferric gluconate (FERRLECIT/NULECIT) IV     DEY:CXKGYJEHUDJSH, ALPRAZolam, HYDROcodone-acetaminophen, ondansetron (ZOFRAN) IV   ROS:                                                                                                                                       ROS was performed and is negative except as noted in HPI    General Examination:  Blood pressure (!) 170/80, pulse (!) 104, temperature 98 F (36.7 C), temperature source Axillary, resp. rate 16, height 5\' 5"  (1.651 m), weight 65.5 kg, SpO2 100 %.  HEENT-  Normocephalic, no lesions, without obvious abnormality.  Normal external eye and conjunctiva.  Cardiovascular- S1-S2 audible, pulses palpable throughout   Lungs-no rhonchi or wheezing noted, no excessive working breathing.  Saturations within normal limits on RA Abdomen- All 4 quadrants palpated and nontender Extremities- Warm, dry and intact Musculoskeletal-no joint tenderness, deformity or swelling Skin-warm and dry, no hyperpigmentation, vitiligo, or suspicious lesions  Neurological Examination Mental Status: Alert, oriented to person/ place/ age/ birth date/president/ year thought content appropriate.  Speech fluent without evidence of aphasia.  Slight dysarthria. Able to follow  commands without difficulty. Cranial Nerves: II:  Visual fields grossly normal,  III,IV, VI: ptosis not present, extra-ocular motions intact bilaterally, pupils equal, round, reactive to light and accommodation V,VII: smile asymmetric, slight left facial droop, facial light touch sensation normal bilaterally VIII: hearing normal bilaterally IX,X: uvula rises symmetrically XI: bilateral shoulder shrug XII: midline tongue extension Motor: Right : Upper extremity   4/5  Left:   Upper extremity   4/5  Lower extremity   2/5   Lower extremity   4/5 BUE are equally weak, RLE ROM limited by recent surgery Tone and bulk:normal tone throughout; no atrophy noted Sensory: light touch intact throughout, bilaterally Deep Tendon Reflexes: 2+ and symmetric biceps and patella Plantars: Right: downgoing   Left: downgoing Cerebellar: normal finger-to-nose, unable to perform HTS d/t surgery Gait: deferred Other: Intermittent subtle jerking of distal RUE and RLE, nonsynchronous, is noted on exam.     Lab Results: Basic Metabolic Panel: Recent Labs  Lab 05/16/18 1702 05/17/18 0740  05/17/18 1840 05/18/18 0246 05/19/18 0657 05/19/18 0713 05/21/18 0707  NA 137 137  --  140 136 136  --  138  K 4.5 4.7  --  4.6 4.1 4.2  --  3.8  CL 101 101  --  102 98 96*  --  99  CO2 24 22  --  21* 23 25  --  25  GLUCOSE 101* 91  --  106* 110* 99  --  81  BUN 42* 50*  --  19 23 44*  --  48*  CREATININE 10.30* 11.17*  --  5.43* 5.97* 7.58*  --  6.56*  CALCIUM 8.1* 7.5*   < > 7.8* 7.6* 8.0* 7.9* 8.5*  PHOS 4.6 5.6*  --  5.6*  --  5.7*  --  3.8   < > = values in this interval not displayed.    CBC: Recent Labs  Lab 05/16/18 0620 05/17/18 0320 05/18/18 0246 05/19/18 0116 05/20/18 0426 05/21/18 0707  WBC 4.9 5.8 8.0 8.6 7.8 7.1  NEUTROABS 3.5  --  6.4 6.7 5.3 5.0  HGB 11.2* 10.1* 8.5* 7.6* 6.9* 9.7*  HCT 37.5 34.3* 28.3* 24.5* 22.4* 30.1*  MCV 83.7 84.5 83.5 81.4 80.9 81.6  PLT 275 272 233 228 241 241    Imaging: Ct Head Wo Contrast  Result Date: 05/20/2018 CLINICAL DATA:  Altered mental status. EXAM: CT HEAD WITHOUT CONTRAST TECHNIQUE: Contiguous axial images were obtained from the base of the skull through the vertex without intravenous contrast. COMPARISON:  MRI brain 09/26/2017. FINDINGS: Brain: Moderate atrophy and white matter disease is similar the prior exam. There is volume loss in the medial right temporal lobe. Although the patient had a recent infarct, there has been chronic loss of volume on  the right. Chronic ischemic changes extend into the brainstem. No acute hemorrhage or mass lesion is present. No acute infarcts are evident. The ventricles are proportionate to the degree of atrophy. No significant extra-axial fluid collection is present. Vascular: Atherosclerotic calcifications are present within the cavernous internal carotid arteries bilaterally and at the dural margin of both vertebral arteries. There is no hyperdense vessel. Skull: Calvarium is intact. No focal lytic or  blastic lesions are present. Hyperostosis is present. No significant extracranial soft tissue lesion is noted. Sinuses/Orbits: There is chronic opacification of the left sphenoid sinus with a known meningoencephalocele. The paranasal sinuses and mastoid air cells are otherwise clear. Globes and orbits are within normal limits. IMPRESSION: 1. No acute intracranial abnormality or significant interval change. 2. Stable Vance atrophy and white matter disease. This likely reflects the sequela of chronic microvascular ischemia. 3. Asymmetric volume loss and remote ischemia involving the medial right temporal lobe. 4. Chronic left sphenoid sinus opacification. This represents a known meningoencephalocele . Electronically Signed   By: San Morelle M.D.   On: 05/20/2018 12:43   Mr Brain Wo Contrast  Result Date: 05/20/2018 CLINICAL DATA:  Altered mental status EXAM: MRI HEAD WITHOUT CONTRAST TECHNIQUE: Multiplanar, multiecho pulse sequences of the brain and surrounding structures were obtained without intravenous contrast. COMPARISON:  Head CT 05/20/2018 Brain MRI 09/26/2017 FINDINGS: BRAIN: There is a small focus of diffusion restriction within the right parietal lobe, measuring approximately 4 mm. There are 2-3 punctate foci of diffusion restriction in the inferior right cerebellar hemisphere. The midline structures are normal. No midline shift or other mass effect. Bilateral old thalamic infarcts. Diffuse confluent hyperintense T2-weighted signal within the periventricular, deep and juxtacortical white matter, most commonly due to chronic ischemic microangiopathy. The cerebral and cerebellar volume are age-appropriate. There are numerous chronic microhemorrhages in a predominantly central distribution. VASCULAR: Major intracranial arterial and venous sinus flow voids are normal. SKULL AND UPPER CERVICAL SPINE: Calvarial bone marrow signal is normal. There is no skull base mass. Visualized upper cervical spine  and soft tissues are normal. SINUSES/ORBITS: Right-greater-than-left mastoid effusions. Unchanged left sphenoid sinus meningoencephalocele. The orbits are normal. IMPRESSION: 1. Multiple small foci of acute ischemia: 1 in the right parietal lobe and 2-3 in the right cerebellum. No acute hemorrhage or mass effect. 2. 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 3. 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 4. Unchanged left sphenoid sinus meningoencephalocele. Electronically Signed   By: Ulyses Jarred M.D.   On: 05/20/2018 23:18   Dg Chest Port 1 View  Result Date: 05/20/2018 CLINICAL DATA:  Altered mental status. EXAM: PORTABLE CHEST 1 VIEW COMPARISON:  Radiograph of May 16, 2018. FINDINGS: The heart size and mediastinal contours are within normal limits. Both lungs are clear. The visualized skeletal structures are unremarkable. IMPRESSION: No active disease. Electronically Signed   By: Marijo Conception, M.D.   On: 05/20/2018 15:52    Laurey Morale, MSN, NP-C [facility] [phone] 05/21/2018, 2:32 PM    Assessment: 67 y.o. female with a PMH significant for stroke, ICH (2016), HTN, ESRD (on dialysis), uterine cancer (2012) who presented to [place] after a mechanical fall. Was transferred to [facility] for orthopedic surgery. Neurology consulted for punctate stroke seen on MRI.  1) Strokes appear most likely to be cardio-embolic given locations. TEE for sources of embolus could be obtained, but given history of ICH, TEE results would not likely change management with ASA and Plavix, which would carry a significantly  lower risk of recurrent hemorrhage than anticoagulation.  2) A similar argument can be made regarding possible loop recorder- would eval if anticoagulation was warranted, but given history of ICH, if she has intermittent a-fib anticoagulation risks would most likely outweigh benefits.  3) Stroke Risk Factors -  prior stroke, hypertension  4) Intermittent subtle jerking of distal RUE and RLE, nonsynchronous, is noted on exam. This may represent asterixis, fidgeting or myoclonus/subclinical seizure activity. If due to seizure, then this may explain her worsened mentation, which has been noted by her daughter to be persistent since her operative intervention. EEG is indicated to further evaluate.    Recommendations: -- BP goal: Would treat according to standard protocol as the small strokes appear on MRI to be more likely subacute or late acute than hyperacute.  -- Consider TEE and loop recorder for anticoagulation to prevent stroke, but see Assessment above regarding this. -- Continue ASA 81 mg and Plavix 75mg  -- High intensity statin if LDL > 70 (was on 10 mg prior to admission) -- HgbA1c, fasting lipid panel -- PT consult, OT consult, Speech consult --Telemetry monitoring --Frequent neuro checks --Stroke swallow screen --Has had recent stroke work up in February, therefore repeating vascular imaging unlikely to be of benefit. -- EEG --Please page stroke NP  Or  PA  Or MD from 8am -4 pm  as this patient from this time will be  followed by the stroke.   You can look them up on [email or web address]  Password [email or web address]   I have seen and evaluated the patient. I have amended the assessment and recommendations above.  Electronically signed: Dr. Kerney Elbe

## 2018-05-21 NOTE — Plan of Care (Signed)
  Problem: Activity: Goal: Risk for activity intolerance will decrease Outcome: Progressing   Problem: Pain Managment: Goal: General experience of comfort will improve Outcome: Progressing   Problem: Safety: Goal: Ability to remain free from injury will improve Outcome: Progressing   

## 2018-05-21 NOTE — Plan of Care (Signed)

## 2018-05-21 NOTE — Progress Notes (Signed)
PROGRESS NOTE  Sonya [name] ZOX:[id number] DOB: Dec 01, 1950 DOA: 05/16/2018 PCP: Redmond School, MD  HPI/Recap of past 24 hours: Sonya [name] is a 67 y.o. female with medical history of anemia of CKD, anxiety, ESRD on hemodialysis Tuesday/Thursday/Saturday, diverticulosis/diverticulitis, history of dysphagia, history of fracture of left femoral neck, GERD, hypertension, hyperparathyroidism due to renal insufficiency, hypothyroidism, intracerebral hemorrhage, meningocele, protein calorie malnutrition, ischemic stroke, history of uterine cancer presenting after mechanical fall going to the bathroom.  The patient states that she was using her walker.  She was trying to get up off the commode when her right leg "gave out", and the patient fell onto her right side.  The patient had extreme pain and was unable to bear any weight.  EMS was activated.  In the emergency department, x-rays of the hip revealed an acute comminuted fracture of the right femoral diaphysis with half shaft width displacement. Pt admitted for further management.   Today, pt noted to be more awake, still confused. Slightly increasing appetite. Reports her feet is hurting, denies any new complaints.  Assessment/Plan: Active Problems:   Essential hypertension   Hyperparathyroidism due to renal insufficiency (HCC)   Hypothyroidism, adult   Hyperlipidemia   End stage renal failure on dialysis [facility])   Fracture of condyle of right femur, closed, initial encounter ([place])   Closed fracture of right femur, unspecified fracture morphology, initial encounter ([place])  Acute comminuted fracture of the right femur s/p ORIF on 05/17/18 Orthopedics on board Pain management and DVT ppx as per ortho PT eval after surgery  Acute metabolic encephalopathy likely 2/2 acute ischemia ?? Underlying dementia, confused, no signs of infection Afebrile, no leukocytosis CT head unremarkable MRI showed multiple small foci of acute ischemia, one  in R parietal lobe, 2-3 in right cerebellum. No acute hemorrhage or mass effect Lipid panel, A1c pending Neurology consulted, appreciate recs Continue ASA, lipitor, restarted plavix Monitor closely  ESRD Nephrology on board HD as per nephrology  Anemia of CKD/Acute blood loss post op Baseline hemoglobin 11-12 Hemoglobin 7.6-->6.9, s/p 2U of PRBC on 05/20/18 IV iron on HD days Monitor closely, daily cbc  Secondary hyperparathyroidism Management per nephrology  Essential hypertension BP stable Held nifedipine for permissive HTN  History of ischemic stroke in 09/2017 Management as above  Hyperlipidemia Continue statin  Hypothyroidism Continue synthroid  Poor appetite/?malnourished  Dietician on board, appreciate recs Started Megace as daughter stated it worked in the past Dietary supplements    Code Status: Full   Family Communication: Spoke to Daughter  Disposition Plan: SNF   Consultants:  Orthopedics  Neurology  Procedures:  ORIF on 05/17/18  Antimicrobials:  None  DVT prophylaxis: Heparin   Objective: Vitals:   05/20/18 1740 05/20/18 1800 05/20/18 2036 05/21/18 0436  BP: (!) 142/64 (!) 144/64 (!) 150/77 (!) 170/80  Pulse: 99 (!) 101 (!) 106 (!) 104  Resp: 16 16    Temp: (!) 97.4 F (36.3 C) (!) 97.4 F (36.3 C) 98.4 F (36.9 C) 98 F (36.7 C)  TempSrc: Axillary Axillary Axillary Axillary  SpO2: 99% 100% 98% 100%  Weight:      Height:        Intake/Output Summary (Last 24 hours) at 05/21/2018 1529 Last data filed at 05/21/2018 1012 Gross per 24 hour  Intake 1026 ml  Output -  Net 1026 ml   Filed Weights   05/17/18 1153 05/19/18 0728 05/19/18 1141  Weight: 64.4 kg 66.7 kg 65.5 kg    Exam:  General: NAD, appears older than stated age, confused  Cardiovascular: S1, S2 present  Respiratory: CTAB  Abdomen: Soft, NT, ND, BS present  Musculoskeletal: No bilateral pedal edema noted  Skin: Normal  Psychiatry: Fair  mood  Neurology: Unable to follow commands appropriately, but no obvious focal neurologic deficit noted   Data Reviewed: CBC: Recent Labs  Lab 05/16/18 0620 05/17/18 0320 05/18/18 0246 05/19/18 0116 05/20/18 0426 05/21/18 0707  WBC 4.9 5.8 8.0 8.6 7.8 7.1  NEUTROABS 3.5  --  6.4 6.7 5.3 5.0  HGB 11.2* 10.1* 8.5* 7.6* 6.9* 9.7*  HCT 37.5 34.3* 28.3* 24.5* 22.4* 30.1*  MCV 83.7 84.5 83.5 81.4 80.9 81.6  PLT 275 272 233 228 241 093   Basic Metabolic Panel: Recent Labs  Lab 05/16/18 1702 05/17/18 0740  05/17/18 1840 05/18/18 0246 05/19/18 0657 05/19/18 0713 05/21/18 0707  NA 137 137  --  140 136 136  --  138  K 4.5 4.7  --  4.6 4.1 4.2  --  3.8  CL 101 101  --  102 98 96*  --  99  CO2 24 22  --  21* 23 25  --  25  GLUCOSE 101* 91  --  106* 110* 99  --  81  BUN 42* 50*  --  19 23 44*  --  48*  CREATININE 10.30* 11.17*  --  5.43* 5.97* 7.58*  --  6.56*  CALCIUM 8.1* 7.5*   < > 7.8* 7.6* 8.0* 7.9* 8.5*  PHOS 4.6 5.6*  --  5.6*  --  5.7*  --  3.8   < > = values in this interval not displayed.   GFR: Estimated Creatinine Clearance: 7.4 mL/min (A) (by C-G formula based on SCr of 6.56 mg/dL (H)). Liver Function Tests: Recent Labs  Lab 05/16/18 0620 05/16/18 1702 05/17/18 0740 05/17/18 1840 05/19/18 0657 05/21/18 0707  AST 13*  --   --   --   --   --   ALT 8  --   --   --   --   --   ALKPHOS 92  --   --   --   --   --   BILITOT 0.7  --   --   --   --   --   PROT 7.8  --   --   --   --   --   ALBUMIN 3.6 3.2* 2.9* 2.9* 2.7* 2.5*   No results for input(s): LIPASE, AMYLASE in the last 168 hours. No results for input(s): AMMONIA in the last 168 hours. Coagulation Profile: Recent Labs  Lab 05/16/18 0620 05/17/18 0320  INR 1.20 1.32   Cardiac Enzymes: No results for input(s): CKTOTAL, CKMB, CKMBINDEX, TROPONINI in the last 168 hours. BNP (last 3 results) No results for input(s): PROBNP in the last 8760 hours. HbA1C: No results for input(s): HGBA1C in the last  72 hours. CBG: No results for input(s): GLUCAP in the last 168 hours. Lipid Profile: No results for input(s): CHOL, HDL, LDLCALC, TRIG, CHOLHDL, LDLDIRECT in the last 72 hours. Thyroid Function Tests: No results for input(s): TSH, T4TOTAL, FREET4, T3FREE, THYROIDAB in the last 72 hours. Anemia Panel: Recent Labs    05/19/18 0713  TIBC NOT CALCULATED  IRON 14*   Urine analysis:    Component Value Date/Time   COLORURINE YELLOW 04/14/2015 1427   APPEARANCEUR CLEAR 04/14/2015 1427   LABSPEC 1.020 04/14/2015 1427   PHURINE 8.5 (H) 04/14/2015 1427   GLUCOSEU  100 (A) 04/14/2015 1427   HGBUR SMALL (A) 04/14/2015 1427   BILIRUBINUR NEGATIVE 04/14/2015 1427   KETONESUR NEGATIVE 04/14/2015 1427   PROTEINUR >300 (A) 04/14/2015 1427   UROBILINOGEN 0.2 04/14/2015 1427   NITRITE NEGATIVE 04/14/2015 1427   LEUKOCYTESUR TRACE (A) 04/14/2015 1427   Sepsis Labs: @LABRCNTIP (procalcitonin:4,lacticidven:4)  ) Recent Results (from the past 240 hour(s))  Surgical PCR screen     Status: None   Collection Time: 05/16/18  6:11 PM  Result Value Ref Range Status   MRSA, PCR NEGATIVE NEGATIVE Final   Staphylococcus aureus NEGATIVE NEGATIVE Final    Comment: (NOTE) The Xpert SA Assay (FDA approved for NASAL specimens in patients 58 years of age and older), is one component of a comprehensive surveillance program. It is not intended to diagnose infection nor to guide or monitor treatment. Performed at [facility], [place] [address], [place], [place] [address]       Studies: Mr Brain 87 Contrast  Result Date: 05/20/2018 CLINICAL DATA:  Altered mental status EXAM: MRI HEAD WITHOUT CONTRAST TECHNIQUE: Multiplanar, multiecho pulse sequences of the brain and surrounding structures were obtained without intravenous contrast. COMPARISON:  Head CT 05/20/2018 Brain MRI 09/26/2017 FINDINGS: BRAIN: There is a small focus of diffusion restriction within the right parietal lobe, measuring approximately  4 mm. There are 2-3 punctate foci of diffusion restriction in the inferior right cerebellar hemisphere. The midline structures are normal. No midline shift or other mass effect. Bilateral old thalamic infarcts. Diffuse confluent hyperintense T2-weighted signal within the periventricular, deep and juxtacortical white matter, most commonly due to chronic ischemic microangiopathy. The cerebral and cerebellar volume are age-appropriate. There are numerous chronic microhemorrhages in a predominantly central distribution. VASCULAR: Major intracranial arterial and venous sinus flow voids are normal. SKULL AND UPPER CERVICAL SPINE: Calvarial bone marrow signal is normal. There is no skull base mass. Visualized upper cervical spine and soft tissues are normal. SINUSES/ORBITS: Right-greater-than-left mastoid effusions. Unchanged left sphenoid sinus meningoencephalocele. The orbits are normal. IMPRESSION: 1. Multiple small foci of acute ischemia: 1 in the right parietal lobe and 2-3 in the right cerebellum. No acute hemorrhage or mass effect. 2. 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 3. 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 4. Unchanged left sphenoid sinus meningoencephalocele. Electronically Signed   By: Ulyses Jarred M.D.   On: 05/20/2018 23:18   Dg Chest Port 1 View  Result Date: 05/20/2018 CLINICAL DATA:  Altered mental status. EXAM: PORTABLE CHEST 1 VIEW COMPARISON:  Radiograph of May 16, 2018. FINDINGS: The heart size and mediastinal contours are within normal limits. Both lungs are clear. The visualized skeletal structures are unremarkable. IMPRESSION: No active disease. Electronically Signed   By: Marijo Conception, M.D.   On: 05/20/2018 15:52    Scheduled Meds: . allopurinol  100 mg Oral Q Wed  . aspirin EC  81 mg Oral Daily  . atorvastatin  40 mg Oral q1800  . [START ON 05/22/2018] calcitRIOL  1 mcg Oral Q M,W,F-HD  . calcium  acetate  667 mg Oral TID WC  . Chlorhexidine Gluconate Cloth  6 each Topical Q0600  . clopidogrel  75 mg Oral Daily  . darbepoetin (ARANESP) injection - DIALYSIS  150 mcg Intravenous Q Fri-HD  . feeding supplement  1 Container Oral TID BM  . gabapentin  100 mg Oral QHS  . levothyroxine  50 mcg Oral QAC breakfast  . lidocaine-prilocaine  1 application Topical Q C,HE,NID-7824  .  megestrol  40 mg Oral Daily  . multivitamin  1 tablet Oral QHS  . pentoxifylline  400 mg Oral TID WC  . polyethylene glycol  17 g Oral BID  . senna-docusate  1 tablet Oral BID    Continuous Infusions: . [START ON 05/22/2018] ferric gluconate (FERRLECIT/NULECIT) IV       LOS: 5 days     Alma Friendly, MD [facility]   If 7PM-7AM, please contact night-coverage [email or web address] 05/21/2018, 3:29 PM

## 2018-05-21 NOTE — Progress Notes (Signed)
Subjective: Interval History: has no complaint .  Objective: Vital signs in last 24 hours: Temp:  [97.4 F (36.3 C)-99.3 F (37.4 C)] 98 F (36.7 C) (10/20 0436) Pulse Rate:  [99-106] 104 (10/20 0436) Resp:  [16-18] 16 (10/19 1800) BP: (132-170)/(62-80) 170/80 (10/20 0436) SpO2:  [98 %-100 %] 100 % (10/20 0436) Weight change:   Intake/Output from previous day: 10/19 0701 - 10/20 0700 In: 1003 [P.O.:200; I.V.:500; Blood:303] Out: -  Intake/Output this shift: No intake/output data recorded.  General appearance: cooperative, no distress and Ox1 Resp: diminished breath sounds bilaterally Cardio: S1, S2 normal and systolic murmur: systolic ejection 2/6, crescendo and decrescendo at 2nd left intercostal space, 2nd M at apex GI: soft, pos bs, liver down 4 cm Extremities: AVG RUA, R femur area swollen, warm  Lab Results: Recent Labs    05/20/18 0426 05/21/18 0707  WBC 7.8 7.1  HGB 6.9* 9.7*  HCT 22.4* 30.1*  PLT 241 241   BMET:  Recent Labs    05/19/18 0657 05/19/18 0713  NA 136  --   K 4.2  --   CL 96*  --   CO2 25  --   GLUCOSE 99  --   BUN 44*  --   CREATININE 7.58*  --   CALCIUM 8.0* 7.9*   Recent Labs    05/19/18 0713  PTH 743*  Comment   Iron Studies:  Recent Labs    05/19/18 0713  IRON 14*  TIBC NOT CALCULATED    Studies/Results: Ct Head Wo Contrast  Result Date: 05/20/2018 CLINICAL DATA:  Altered mental status. EXAM: CT HEAD WITHOUT CONTRAST TECHNIQUE: Contiguous axial images were obtained from the base of the skull through the vertex without intravenous contrast. COMPARISON:  MRI brain 09/26/2017. FINDINGS: Brain: Moderate atrophy and white matter disease is similar the prior exam. There is volume loss in the medial right temporal lobe. Although the patient had a recent infarct, there has been chronic loss of volume on the right. Chronic ischemic changes extend into the brainstem. No acute hemorrhage or mass lesion is present. No acute infarcts  are evident. The ventricles are proportionate to the degree of atrophy. No significant extra-axial fluid collection is present. Vascular: Atherosclerotic calcifications are present within the cavernous internal carotid arteries bilaterally and at the dural margin of both vertebral arteries. There is no hyperdense vessel. Skull: Calvarium is intact. No focal lytic or blastic lesions are present. Hyperostosis is present. No significant extracranial soft tissue lesion is noted. Sinuses/Orbits: There is chronic opacification of the left sphenoid sinus with a known meningoencephalocele. The paranasal sinuses and mastoid air cells are otherwise clear. Globes and orbits are within normal limits. IMPRESSION: 1. No acute intracranial abnormality or significant interval change. 2. Stable Vance atrophy and white matter disease. This likely reflects the sequela of chronic microvascular ischemia. 3. Asymmetric volume loss and remote ischemia involving the medial right temporal lobe. 4. Chronic left sphenoid sinus opacification. This represents a known meningoencephalocele . Electronically Signed   By: San Morelle M.D.   On: 05/20/2018 12:43   Mr Brain Wo Contrast  Result Date: 05/20/2018 CLINICAL DATA:  Altered mental status EXAM: MRI HEAD WITHOUT CONTRAST TECHNIQUE: Multiplanar, multiecho pulse sequences of the brain and surrounding structures were obtained without intravenous contrast. COMPARISON:  Head CT 05/20/2018 Brain MRI 09/26/2017 FINDINGS: BRAIN: There is a small focus of diffusion restriction within the right parietal lobe, measuring approximately 4 mm. There are 2-3 punctate foci of diffusion restriction in the inferior  right cerebellar hemisphere. The midline structures are normal. No midline shift or other mass effect. Bilateral old thalamic infarcts. Diffuse confluent hyperintense T2-weighted signal within the periventricular, deep and juxtacortical white matter, most commonly due to chronic ischemic  microangiopathy. The cerebral and cerebellar volume are age-appropriate. There are numerous chronic microhemorrhages in a predominantly central distribution. VASCULAR: Major intracranial arterial and venous sinus flow voids are normal. SKULL AND UPPER CERVICAL SPINE: Calvarial bone marrow signal is normal. There is no skull base mass. Visualized upper cervical spine and soft tissues are normal. SINUSES/ORBITS: Right-greater-than-left mastoid effusions. Unchanged left sphenoid sinus meningoencephalocele. The orbits are normal. IMPRESSION: 1. Multiple small foci of acute ischemia: 1 in the right parietal lobe and 2-3 in the right cerebellum. No acute hemorrhage or mass effect. 2. 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 3. 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 4. Unchanged left sphenoid sinus meningoencephalocele. Electronically Signed   By: Ulyses Jarred M.D.   On: 05/20/2018 23:18   Dg Chest Port 1 View  Result Date: 05/20/2018 CLINICAL DATA:  Altered mental status. EXAM: PORTABLE CHEST 1 VIEW COMPARISON:  Radiograph of May 16, 2018. FINDINGS: The heart size and mediastinal contours are within normal limits. Both lungs are clear. The visualized skeletal structures are unremarkable. IMPRESSION: No active disease. Electronically Signed   By: Marijo Conception, M.D.   On: 05/20/2018 15:52    I have reviewed the patient's current medications.  Assessment/Plan: 1 ESRD for HD in am 2 Anemia TX , on esa,/Fe, suspect blood loss into thigh 3 HPTH vit D 4 confusion no diff than on admit, ? CVA causing fall or other recent' 5 HTN rising see how does after HD, may need to restart Nifedipine 6 Femur fx 7 Hx ICB 8 Hx CVAs P Hd, esa, Fe, vit D,    LOS: 5 days   Sonya [name] 05/21/2018,8:58 AM

## 2018-05-21 NOTE — Plan of Care (Signed)
  Problem: Clinical Measurements: Goal: Will remain free from infection Outcome: Progressing Goal: Cardiovascular complication will be avoided Outcome: Progressing   Problem: Clinical Measurements: Goal: Cardiovascular complication will be avoided Outcome: Progressing   Problem: Activity: Goal: Risk for activity intolerance will decrease Outcome: Progressing   Problem: Nutrition: Goal: Adequate nutrition will be maintained Outcome: Progressing   Problem: Coping: Goal: Level of anxiety will decrease Outcome: Progressing   Problem: Elimination: Goal: Will not experience complications related to bowel motility Outcome: Progressing Goal: Will not experience complications related to urinary retention Outcome: Progressing

## 2018-05-22 ENCOUNTER — Inpatient Hospital Stay (HOSPITAL_COMMUNITY): Payer: Medicare Other

## 2018-05-22 DIAGNOSIS — Z8673 Personal history of transient ischemic attack (TIA), and cerebral infarction without residual deficits: Secondary | ICD-10-CM

## 2018-05-22 DIAGNOSIS — I679 Cerebrovascular disease, unspecified: Secondary | ICD-10-CM

## 2018-05-22 DIAGNOSIS — I6340 Cerebral infarction due to embolism of unspecified cerebral artery: Secondary | ICD-10-CM

## 2018-05-22 LAB — CBC WITH DIFFERENTIAL/PLATELET
Abs Immature Granulocytes: 0.04 10*3/uL (ref 0.00–0.07)
BASOS ABS: 0 10*3/uL (ref 0.0–0.1)
BASOS PCT: 0 %
EOS ABS: 0.2 10*3/uL (ref 0.0–0.5)
Eosinophils Relative: 2 %
HCT: 29.5 % — ABNORMAL LOW (ref 36.0–46.0)
Hemoglobin: 9.4 g/dL — ABNORMAL LOW (ref 12.0–15.0)
IMMATURE GRANULOCYTES: 1 %
Lymphocytes Relative: 24 %
Lymphs Abs: 1.7 10*3/uL (ref 0.7–4.0)
MCH: 26 pg (ref 26.0–34.0)
MCHC: 31.9 g/dL (ref 30.0–36.0)
MCV: 81.7 fL (ref 80.0–100.0)
Monocytes Absolute: 0.7 10*3/uL (ref 0.1–1.0)
Monocytes Relative: 10 %
NEUTROS PCT: 63 %
NRBC: 0.4 % — AB (ref 0.0–0.2)
Neutro Abs: 4.4 10*3/uL (ref 1.7–7.7)
PLATELETS: 252 10*3/uL (ref 150–400)
RBC: 3.61 MIL/uL — ABNORMAL LOW (ref 3.87–5.11)
RDW: 15.8 % — AB (ref 11.5–15.5)
WBC: 7 10*3/uL (ref 4.0–10.5)

## 2018-05-22 LAB — RENAL FUNCTION PANEL
ANION GAP: 12 (ref 5–15)
Albumin: 2.3 g/dL — ABNORMAL LOW (ref 3.5–5.0)
BUN: 65 mg/dL — ABNORMAL HIGH (ref 8–23)
CALCIUM: 8.5 mg/dL — AB (ref 8.9–10.3)
CHLORIDE: 97 mmol/L — AB (ref 98–111)
CO2: 26 mmol/L (ref 22–32)
Creatinine, Ser: 7.58 mg/dL — ABNORMAL HIGH (ref 0.44–1.00)
GFR, EST AFRICAN AMERICAN: 6 mL/min — AB (ref >60)
GFR, EST NON AFRICAN AMERICAN: 5 mL/min — AB (ref >60)
Glucose, Bld: 91 mg/dL (ref 70–99)
Phosphorus: 3.5 mg/dL (ref 2.5–4.6)
Potassium: 4.2 mmol/L (ref 3.5–5.1)
SODIUM: 135 mmol/L (ref 135–145)

## 2018-05-22 LAB — LIPID PANEL
Cholesterol: 111 mg/dL (ref 0–200)
HDL: 36 mg/dL — AB (ref >40)
LDL Cholesterol: 41 mg/dL (ref 0–99)
TRIGLYCERIDES: 172 mg/dL — AB (ref <150)
Total CHOL/HDL Ratio: 3.1 RATIO
VLDL: 34 mg/dL (ref 0–40)

## 2018-05-22 MED ORDER — IOPAMIDOL (ISOVUE-370) INJECTION 76%
50.0000 mL | Freq: Once | INTRAVENOUS | Status: AC | PRN
  Administered 2018-05-22: 50 mL via INTRAVENOUS

## 2018-05-22 MED ORDER — HEPARIN SODIUM (PORCINE) 1000 UNIT/ML DIALYSIS
100.0000 [IU]/kg | INTRAMUSCULAR | Status: DC | PRN
  Filled 2018-05-22: qty 7

## 2018-05-22 MED ORDER — CALCITRIOL 1 MCG/ML IV SOLN
INTRAVENOUS | Status: AC
  Administered 2018-05-22: 1 ug
  Filled 2018-05-22: qty 1

## 2018-05-22 MED ORDER — SODIUM CHLORIDE 0.9 % IV SOLN: 100.0000 mL | INTRAVENOUS | Status: DC | PRN

## 2018-05-22 MED ORDER — PENTAFLUOROPROP-TETRAFLUOROETH EX AERO: 1.0000 "application " | INHALATION_SPRAY | CUTANEOUS | Status: DC | PRN

## 2018-05-22 MED ORDER — HEPARIN SODIUM (PORCINE) 1000 UNIT/ML DIALYSIS
1000.0000 [IU] | INTRAMUSCULAR | Status: DC | PRN
  Filled 2018-05-22: qty 1

## 2018-05-22 MED ORDER — IOPAMIDOL (ISOVUE-370) INJECTION 76%
INTRAVENOUS | Status: AC
  Filled 2018-05-22: qty 50

## 2018-05-22 MED ORDER — ALTEPLASE 2 MG IJ SOLR
2.0000 mg | Freq: Once | INTRAMUSCULAR | Status: DC | PRN
  Filled 2018-05-22: qty 2

## 2018-05-22 MED ORDER — LIDOCAINE-PRILOCAINE 2.5-2.5 % EX CREA
1.0000 "application " | TOPICAL_CREAM | CUTANEOUS | Status: DC | PRN
  Filled 2018-05-22: qty 5

## 2018-05-22 MED ORDER — ATORVASTATIN CALCIUM 10 MG PO TABS
10.0000 mg | ORAL_TABLET | Freq: Every day | ORAL | Status: DC
  Administered 2018-05-22 – 2018-05-23 (×2): 10 mg via ORAL
  Filled 2018-05-22 (×2): qty 1

## 2018-05-22 MED ORDER — LIDOCAINE HCL (PF) 1 % IJ SOLN: 5.0000 mL | INTRAMUSCULAR | Status: DC | PRN

## 2018-05-22 NOTE — Progress Notes (Signed)
Subjective:  Seen on HD- no c/o's  Objective Vital signs in last 24 hours: Vitals:   05/22/18 0800 05/22/18 0830 05/22/18 0900 05/22/18 0930  BP: (!) 147/72 (!) 148/70 135/69 (!) 94/55  Pulse: 78 86 95 94  Resp: 16 17 16    Temp:      TempSrc:      SpO2: 100% 100%  100%  Weight:      Height:       Weight change:  No intake or output data in the 24 hours ending 05/22/18 1026  Assessment/ Plan: Pt is a 67 y.o. yo female with ESRD who was admitted on 05/16/2018 with femoral neck fx- s/p ORIF 10/16  Assessment/Plan: 1. Hip fx- s/p ORIF 10/16- PT 2. ESRD - normally TTS [facility] f/b Dr. Hinda Lenis.  Have off schedule here- done Fri and now Monday 3. Possible dementia- work up here [employer]- not sure what disposition is  3. Anemia- req transfusion this hosp- last hgb in the 9's- ESA ordered as well as iron 4. Secondary hyperparathyroidism- on calcitriol and phoslo - phos at goal - last PTH 743 5. HTN/volume- BP seems to drop much in HD- off of HD 140-160- only on IV prn meds here- on nifed 90 as OP has not been given here  [place]: Basic Metabolic Panel: Recent Labs  Lab 05/19/18 0657 05/19/18 0713 05/21/18 0707 05/22/18 0557  NA 136  --  138 135  K 4.2  --  3.8 4.2  CL 96*  --  99 97*  CO2 25  --  25 26  GLUCOSE 99  --  81 91  BUN 44*  --  48* 65*  CREATININE 7.58*  --  6.56* 7.58*  CALCIUM 8.0* 7.9* 8.5* 8.5*  PHOS 5.7*  --  3.8 3.5   Liver Function Tests: Recent Labs  Lab 05/16/18 0620  05/19/18 0657 05/21/18 0707 05/22/18 0557  AST 13*  --   --   --   --   ALT 8  --   --   --   --   ALKPHOS 92  --   --   --   --   BILITOT 0.7  --   --   --   --   PROT 7.8  --   --   --   --   ALBUMIN 3.6   < > 2.7* 2.5* 2.3*   < > = values in this interval not displayed.   No results for input(s): LIPASE, AMYLASE in the last 168 hours. No results for input(s): AMMONIA in the last 168 hours. CBC: Recent Labs  Lab 05/18/18 0246 05/19/18 0116  05/20/18 0426 05/21/18 0707 05/22/18 0557  WBC 8.0 8.6 7.8 7.1 7.0  NEUTROABS 6.4 6.7 5.3 5.0 4.4  HGB 8.5* 7.6* 6.9* 9.7* 9.4*  HCT 28.3* 24.5* 22.4* 30.1* 29.5*  MCV 83.5 81.4 80.9 81.6 81.7  PLT 233 228 241 241 252   Cardiac Enzymes: No results for input(s): CKTOTAL, CKMB, CKMBINDEX, TROPONINI in the last 168 hours. CBG: No results for input(s): GLUCAP in the last 168 hours.  Iron Studies: No results for input(s): IRON, TIBC, TRANSFERRIN, FERRITIN in the last 72 hours. Studies/Results: Ct Head Wo Contrast  Result Date: 05/20/2018 CLINICAL DATA:  Altered mental status. EXAM: CT HEAD WITHOUT CONTRAST TECHNIQUE: Contiguous axial images were obtained from the base of the skull through the vertex without intravenous contrast. COMPARISON:  MRI brain 09/26/2017. FINDINGS: Brain: Moderate atrophy  and white matter disease is similar the prior exam. There is volume loss in the medial right temporal lobe. Although the patient had a recent infarct, there has been chronic loss of volume on the right. Chronic ischemic changes extend into the brainstem. No acute hemorrhage or mass lesion is present. No acute infarcts are evident. The ventricles are proportionate to the degree of atrophy. No significant extra-axial fluid collection is present. Vascular: Atherosclerotic calcifications are present within the cavernous internal carotid arteries bilaterally and at the dural margin of both vertebral arteries. There is no hyperdense vessel. Skull: Calvarium is intact. No focal lytic or blastic lesions are present. Hyperostosis is present. No significant extracranial soft tissue lesion is noted. Sinuses/Orbits: There is chronic opacification of the left sphenoid sinus with a known meningoencephalocele. The paranasal sinuses and mastoid air cells are otherwise clear. Globes and orbits are within normal limits. IMPRESSION: 1. No acute intracranial abnormality or significant interval change. 2. Stable Vance atrophy  and white matter disease. This likely reflects the sequela of chronic microvascular ischemia. 3. Asymmetric volume loss and remote ischemia involving the medial right temporal lobe. 4. Chronic left sphenoid sinus opacification. This represents a known meningoencephalocele . Electronically Signed   By: San Morelle M.D.   On: 05/20/2018 12:43   Mr Brain Wo Contrast  Result Date: 05/20/2018 CLINICAL DATA:  Altered mental status EXAM: MRI HEAD WITHOUT CONTRAST TECHNIQUE: Multiplanar, multiecho pulse sequences of the brain and surrounding structures were obtained without intravenous contrast. COMPARISON:  Head CT 05/20/2018 Brain MRI 09/26/2017 FINDINGS: BRAIN: There is a small focus of diffusion restriction within the right parietal lobe, measuring approximately 4 mm. There are 2-3 punctate foci of diffusion restriction in the inferior right cerebellar hemisphere. The midline structures are normal. No midline shift or other mass effect. Bilateral old thalamic infarcts. Diffuse confluent hyperintense T2-weighted signal within the periventricular, deep and juxtacortical white matter, most commonly due to chronic ischemic microangiopathy. The cerebral and cerebellar volume are age-appropriate. There are numerous chronic microhemorrhages in a predominantly central distribution. VASCULAR: Major intracranial arterial and venous sinus flow voids are normal. SKULL AND UPPER CERVICAL SPINE: Calvarial bone marrow signal is normal. There is no skull base mass. Visualized upper cervical spine and soft tissues are normal. SINUSES/ORBITS: Right-greater-than-left mastoid effusions. Unchanged left sphenoid sinus meningoencephalocele. The orbits are normal. IMPRESSION: 1. Multiple small foci of acute ischemia: 1 in the right parietal lobe and 2-3 in the right cerebellum. No acute hemorrhage or mass effect. 2. 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 3. Chronic small vessel ischemia and  multiple old small vessel infarcts. 4. Unchanged left sphenoid sinus meningoencephalocele. Electronically Signed   By: Ulyses Jarred M.D.   On: 05/20/2018 23:18   Dg Chest Port 1 View  Result Date: 05/20/2018 CLINICAL DATA:  Altered mental status. EXAM: PORTABLE CHEST 1 VIEW COMPARISON:  Radiograph of May 16, 2018. FINDINGS: The heart size and mediastinal contours are within normal limits. Both lungs are clear. The visualized skeletal structures are unremarkable. IMPRESSION: No active disease. Electronically Signed   By: Marijo Conception, M.D.   On: 05/20/2018 15:52   Medications: Infusions: . sodium chloride    . sodium chloride    . ferric gluconate (FERRLECIT/NULECIT) IV      Scheduled Medications: . allopurinol  100 mg Oral Q Wed  . aspirin EC  81 mg Oral Daily  . atorvastatin  40 mg Oral q1800  . calcitRIOL  1 mcg Oral Q M,W,F-HD  . calcium  acetate  667 mg Oral TID WC  . Chlorhexidine Gluconate Cloth  6 each Topical Q0600  . clopidogrel  75 mg Oral Daily  . darbepoetin (ARANESP) injection - DIALYSIS  150 mcg Intravenous Q Fri-HD  . feeding supplement  1 Container Oral TID BM  . gabapentin  100 mg Oral QHS  . heparin injection (subcutaneous)  5,000 Units Subcutaneous Q8H  . levothyroxine  50 mcg Oral QAC breakfast  . lidocaine-prilocaine  1 application Topical Q L,AG,[id number]  . megestrol  40 mg Oral Daily  . multivitamin  1 tablet Oral QHS  . pentoxifylline  400 mg Oral TID WC  . polyethylene glycol  17 g Oral BID  . senna-docusate  1 tablet Oral BID    have reviewed scheduled and prn medications.  Physical Exam: General: pleasant- no c/o's Heart: RRR Lungs: mostly clear Abdomen: soft, non tender Extremities: no apprec edema Dialysis Access: right upper arm AVG    05/22/2018,10:26 AM  LOS: 6 days

## 2018-05-22 NOTE — NC FL2 (Signed)
[place] LEVEL OF CARE SCREENING TOOL     IDENTIFICATION  Patient Name: Sonya [name] Birthdate: August 11, 1950 Sex: female Admission Date (Current Location): 05/16/2018  [facility] and [place] Number:  [employer] and Address:  [facility] [place], [place] [address] [place], [place] [address]      Provider Number: [id number]  Attending Physician Name and Address:  Alma Friendly, MD  Relative Name and Phone Number:  Joanne Chars (daughter) [phone]    Current Level of Care: Hospital Recommended Level of Care: [place] Prior Approval Number:    Date Approved/Denied:   PASRR Number: [id number]  Discharge Plan: SNF    Current Diagnoses: Patient Active Problem List   Diagnosis Date Noted  . Cerebral embolism with cerebral infarction 05/22/2018  . Fracture of condyle of right femur, closed, initial encounter ([place]) 05/16/2018  . Closed fracture of right femur, unspecified fracture morphology, initial encounter ([place]) 05/16/2018  . Closed displaced comminuted fracture of shaft of right femur ([place])   . PAD (peripheral artery disease) ([place]) 03/28/2018  . Atherosclerotic cerebrovascular disease   . End stage renal failure on dialysis ([place])   . Altered mental state 09/28/2017  . Acute ischemic stroke ([place]) 09/28/2017  . Acute metabolic encephalopathy 35/59/7416  . Hyperlipidemia 09/27/2017  . Altered mental status 09/26/2017  . Lactic acidosis 09/26/2017  . Anxiety state 02/03/2017  . Malnutrition of moderate degree 01/19/2017  . Hypoglycemia 01/18/2017  . 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 ESRD (end stage renal disease) ([place])   . Abnormal weight loss 12/01/2015  . Pulmonary edema 08/19/2015  . CAP (community acquired pneumonia) 08/19/2015  . Hypothyroidism, adult 06/14/2015  . HA (headache) 06/13/2015  . HTN (hypertension), malignant 06/13/2015  . Elective surgery   . Fracture of femoral neck, left, closed  ([place]) 05/30/2015  . Hip fracture ([place]) 05/30/2015  . Closed fracture of neck of left femur ([place])   . Gastroesophageal reflux disease with stricture 05/03/2015  . UTI (lower urinary tract infection) 04/14/2015  . Orthostatic hypotension 11/27/2014  . 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 Acute encephalopathy   . Confusion   . Protein-calorie malnutrition, severe ([place]) 10/15/2014  . Dysphagia 10/15/2014  . Intracerebral bleed ([place]) 10/14/2014  . End stage renal disease ([place]) 08/31/2013  . Symptomatic anemia 08/18/2013  . Hyperparathyroidism due to renal insufficiency ([place]) 07/09/2013  . Constipation 06/21/2013  . Gastritis 06/21/2013  . Chronic kidney disease 06/20/2013  . Anemia in chronic kidney disease 06/08/2013  . Gout 06/08/2013  . Other dysphagia 06/07/2013  . Essential hypertension 04/08/2011  . Colon cancer screening 04/08/2011    Orientation RESPIRATION BLADDER Height & Weight     Self  Normal Continent Weight: 138 lb 3.7 oz (62.7 kg) Height:  5\' 5"  (165.1 cm)  BEHAVIORAL SYMPTOMS/MOOD NEUROLOGICAL BOWEL NUTRITION STATUS      Continent Diet(see discharge summary)  AMBULATORY STATUS COMMUNICATION OF NEEDS Skin     Verbally Surgical wounds(closed surgical incision left leg)                       Personal Care Assistance Level of Assistance  Bathing, Feeding, Dressing, Total care Bathing Assistance: Limited assistance Feeding assistance: Independent Dressing Assistance: Maximum assistance Total Care Assistance: Limited assistance   Functional Limitations Info  Sight, Hearing, Speech Sight Info: Adequate Hearing Info: Adequate Speech Info: Adequate    SPECIAL CARE FACTORS FREQUENCY  PT (By licensed PT), OT (By  licensed OT)     PT Frequency: 3x weekly OT Frequency: 3x weekly            Contractures Contractures Info: Not present    Additional Factors Info  Allergies   Allergies Info: Allergies:  Cephalexin            Current Medications (05/22/2018):  This is the current hospital active medication list Current Facility-Administered Medications  Medication Dose Route Frequency Provider Last Rate Last Dose  . acetaminophen (TYLENOL) tablet 650 mg  650 mg Oral Q6H PRN Haddix, Thomasene Lot, MD   650 mg at 05/21/18 1141  . allopurinol (ZYLOPRIM) tablet 100 mg  100 mg Oral Q Wed Haddix, Thomasene Lot, MD      . ALPRAZolam Duanne Moron) tablet 0.5 mg  0.5 mg Oral BID PRN Haddix, Thomasene Lot, MD   0.5 mg at 05/18/18 1458  . aspirin EC tablet 81 mg  81 mg Oral Daily Haddix, Thomasene Lot, MD   81 mg at 05/21/18 6761  . atorvastatin (LIPITOR) tablet 10 mg  10 mg Oral q1800 Donzetta Starch, NP      . calcitRIOL (ROCALTROL) capsule 1 mcg  1 mcg Oral Q M,W,F-HD Mauricia Area, MD   1 mcg at 05/22/18 1256  . calcium acetate (PHOSLO) capsule 667 mg  667 mg Oral TID WC Mauricia Area, MD   667 mg at 05/21/18 1652  . Chlorhexidine Gluconate Cloth 2 % PADS 6 each  6 each Topical [id number] Mauricia Area, MD   6 each at 05/22/18 [phone]  . clopidogrel (PLAVIX) tablet 75 mg  75 mg Oral Daily Alma Friendly, MD   75 mg at 05/21/18 2458  . Darbepoetin Alfa (ARANESP) injection 150 mcg  150 mcg Intravenous Q Solon Palm, MD   150 mcg at 05/19/18 1058  . feeding supplement (BOOST / RESOURCE BREEZE) liquid 1 Container  1 Container Oral TID BM Alma Friendly, MD   1 Container at 05/21/18 1420  . ferric gluconate (NULECIT) 125 mg in sodium chloride 0.9 % 100 mL IVPB  125 mg Intravenous Q M,W,F-HD Mauricia Area, MD 110 mL/hr at 05/22/18 1256 125 mg at 05/22/18 1256  . gabapentin (NEURONTIN) capsule 100 mg  100 mg Oral QHS Haddix, Thomasene Lot, MD   100 mg at 05/21/18 2350  . heparin injection 5,000 Units  5,000 Units Subcutaneous Q8H Alma Friendly, MD   5,000 Units at 05/22/18 0998  . HYDROcodone-acetaminophen (NORCO/VICODIN) 5-325 MG per tablet 1-2 tablet  1-2 tablet Oral Q6H PRN Haddix, Thomasene Lot, MD   1 tablet at 05/21/18 1444   . labetalol (NORMODYNE,TRANDATE) injection 5 mg  5 mg Intravenous Q2H PRN Alma Friendly, MD      . levothyroxine (SYNTHROID, LEVOTHROID) tablet 50 mcg  50 mcg Oral QAC breakfast Haddix, Thomasene Lot, MD   50 mcg at 05/21/18 0747  . lidocaine-prilocaine (EMLA) cream 1 application  1 application Topical Q P,JA,[id number] Haddix, Thomasene Lot, MD      . megestrol (MEGACE) tablet 40 mg  40 mg Oral Daily Alma Friendly, MD   40 mg at 05/21/18 6734  . multivitamin (RENA-VIT) tablet 1 tablet  1 tablet Oral QHS Haddix, Thomasene Lot, MD   1 tablet at 05/21/18 2350  . ondansetron (ZOFRAN) injection 4 mg  4 mg Intravenous Q6H PRN Haddix, Thomasene Lot, MD   4 mg at 05/17/18 1546  . pentoxifylline (TRENTAL) CR tablet 400 mg  400 mg Oral TID WC Haddix, Lennette Bihari  P, MD   400 mg at 05/21/18 1652  . polyethylene glycol (MIRALAX / GLYCOLAX) packet 17 g  17 g Oral BID Alma Friendly, MD   17 g at 05/21/18 2349  . senna-docusate (Senokot-S) tablet 1 tablet  1 tablet Oral BID Alma Friendly, MD   1 tablet at 05/21/18 2350     Discharge Medications: Please see discharge summary for a list of discharge medications.  Relevant Imaging Results:  Relevant Lab Results:   Additional Information SSN: [id number]  Alberteen Sam, LCSW

## 2018-05-22 NOTE — Progress Notes (Addendum)
CSW reached out to patient daughter earlier today  to discuss SNF options, no answer, lvm.   CSW reached out once again this evening, no answer straight to voicemail.   CSW attempted home phone listed for daughter, continued to ring out with no voicemail.  Will continue to follow up.   [place], [place]

## 2018-05-22 NOTE — Progress Notes (Signed)
Physical Therapy Treatment Patient Details Name: Sonya [name] MRN: [id number] DOB: 17-Apr-1951 Today's Date: 05/22/2018    History of Present Illness is a 67 y.o. female with medical history of  anemia of CKD, anxiety, ESRD on hemodialysis Tuesday/Thursday/Saturday, diverticulosis/diverticulitis, history of dysphagia, history of fracture of left femoral neck, GERD, hypertension, hyperparathyroidism due to renal insufficiency, hypothyroidism, intracerebral hemorrhage, meningocele, protein calorie malnutrition, ischemic stroke, history of uterine cancer presenting after mechanical fall going to the bathroom.  s/p 05/17/18 ORIF of condyle fx of R femur    PT Comments    Patient seen for mobility progression. Pt continues to demonstrate generalized weakness and unable to progress to standing with RW this session. Pt requires max A +2 with max cues for functional transfers OOB with use of Stedy standing frame. Continue to progress as tolerated with anticipated d/c to SNF for further skilled PT services.     Follow Up Recommendations  SNF     Equipment Recommendations  None recommended by PT    Recommendations for Other Services OT consult     Precautions / Restrictions Precautions Precautions: Fall Restrictions Weight Bearing Restrictions: Yes RLE Weight Bearing: Weight bearing as tolerated    Mobility  Bed Mobility Overal bed mobility: Needs Assistance Bed Mobility: Supine to Sit     Supine to sit: HOB elevated;Max assist     General bed mobility comments: assist to bring R LE/hips to EOB and to elevate trunk into sitting; cues for sequencing   Transfers Overall transfer level: Needs assistance   Transfers: Sit to/from Stand Sit to Stand: Max assist;+2 physical assistance;+2 safety/equipment         General transfer comment: max cues for hand placement, sequencing, and safe use of AD; assistance required to power up into standing with facilitation at glutes for hip  extension  Ambulation/Gait                 Stairs             Wheelchair Mobility    Modified Rankin (Stroke Patients Only)       Balance Overall balance assessment: Needs assistance Sitting-balance support: Feet supported;Bilateral upper extremity supported Sitting balance-Leahy Scale: Poor Sitting balance - Comments: requires min guard/min A for sitting balance EOB and on BSC                                    Cognition Arousal/Alertness: Awake/alert Behavior During Therapy: Flat affect Overall Cognitive Status: Impaired/Different from baseline Area of Impairment: Memory;Awareness;Following commands;Problem solving                     Memory: Decreased short-term memory Following Commands: Follows one step commands with increased time;Follows one step commands inconsistently   Awareness: Emergent Problem Solving: Decreased initiation;Difficulty sequencing;Requires verbal cues;Requires tactile cues        Exercises      General Comments General comments (skin integrity, edema, etc.): daughter present throughout      Pertinent Vitals/Pain Pain Assessment: Faces Faces Pain Scale: Hurts even more Pain Location: R LE with flexion/weight bearing  Pain Descriptors / Indicators: Grimacing;Guarding;Moaning Pain Intervention(s): Limited activity within patient's tolerance;Monitored during session;Repositioned    Home Living                      Prior Function            PT Goals (  current goals can now be found in the care plan section) Acute Rehab PT Goals PT Goal Formulation: With patient Time For Goal Achievement: 06/01/18 Progress towards PT goals: Progressing toward goals    Frequency    Min 3X/week      PT Plan Current plan remains appropriate    Co-evaluation              AM-PAC PT "6 Clicks" Daily Activity  Outcome Measure  Difficulty turning over in bed (including adjusting bedclothes,  sheets and blankets)?: Unable Difficulty moving from lying on back to sitting on the side of the bed? : Unable Difficulty sitting down on and standing up from a chair with arms (e.g., wheelchair, bedside commode, etc,.)?: Unable Help needed moving to and from a bed to chair (including a wheelchair)?: A Lot Help needed walking in hospital room?: Total Help needed climbing 3-5 steps with a railing? : Total 6 Click Score: 7    End of Session Equipment Utilized During Treatment: Gait belt Activity Tolerance: Patient limited by pain;Patient limited by fatigue Patient left: with call bell/phone within reach;with family/visitor present;in chair Nurse Communication: Mobility status PT Visit Diagnosis: Unsteadiness on feet (R26.81);Other abnormalities of gait and mobility (R26.89);Muscle weakness (generalized) (M62.81);Difficulty in walking, not elsewhere classified (R26.2);Pain Pain - Right/Left: Right Pain - part of body: Leg     Time: 1350-1415 PT Time Calculation (min) (ACUTE ONLY): 25 min  Charges:  $Therapeutic Activity: 23-37 mins                     Sonya [name], PTA Acute Rehabilitation Services Pager: [phone] Office: [phone]     Darliss Cheney 05/22/2018, 3:32 PM

## 2018-05-22 NOTE — Consult Note (Signed)
   [facility] CM Inpatient Consult   05/22/2018  [name] 08/16/1950 [id number]  Chart reviewed for high risk for unplanned readmissions.  Sonya L Moormanis a 67 y.o.femalewith medical history of anemia of CKD, anxiety, ESRD on hemodialysis Tuesday/Thursday/Saturday, diverticulosis/diverticulitis, history of dysphagia, history of fracture of left femoral neck, GERD, hypertension, hyperparathyroidism due to renal insufficiency, hypothyroidism, intracerebral hemorrhage, meningocele, protein calorie malnutrition, ischemic stroke, history of uterine cancer presenting after mechanical fall going to the bathroom. The patient states that she was using her walker. She was trying to get up off the commode when her right leg "gave out", and the patient fell onto her right side. The patient had extreme pain and was unable to bear any weight.  Met with the patient and her daughter Sonya [name] regarding post hospital follow up at the skilled nursing facility as currently planned.She states she would like follow up at the facility.  Currently, there is no definite disposition noted.  Made inpatient RNCM, that the patient has signed up for transitional follow up at a SNF. Checking to make sure patient is not in a bundle. Consent form signed.  Folder with [facility] information given to daughter.  Will follow for disposition and needs.  For questions, please contact:  Natividad Brood, RN BSN [facility] Liaison  [phone] business mobile phone Toll free office [phone]

## 2018-05-22 NOTE — Progress Notes (Signed)
Physical Therapy Cancellation Note  Pt in HD. PT will continue to follow acutely.    05/22/18 0838  PT Visit Information  Last PT Received On 05/22/18  Reason Eval/Treat Not Completed Patient at procedure or test/unavailable   Earney Navy, PTA Acute Rehabilitation Services Pager: [phone] Office: [phone]

## 2018-05-22 NOTE — Procedures (Signed)
Patient was seen on dialysis and the procedure was supervised.  BFR 355  Via AVG BP is  79/50.   Patient appears to be tolerating treatment well- low BP- adjust cuff and give albumin   Louis Meckel 05/22/2018

## 2018-05-22 NOTE — Progress Notes (Signed)
Pt had HD today; EEG will be done 10/22 as schedule permits.

## 2018-05-22 NOTE — Evaluation (Signed)
Clinical/Bedside Swallow Evaluation Patient Details  Name: Sonya [name] MRN: [id number] Date of Birth: 21-Jan-1951  Today's Date: 05/22/2018 Time: SLP Start Time (ACUTE ONLY): 1404 SLP Stop Time (ACUTE ONLY): 1445 SLP Time Calculation (min) (ACUTE ONLY): 41 min  Past Medical History:  Past Medical History:  Diagnosis Date  . Anemia   . Anxiety   . CKD (chronic kidney disease)   . Diverticulitis   . Dysphagia   . ESRD (end stage renal disease) (HCC)    On HD  . Fracture of femoral neck, left, closed (HCC) 05/30/2015  . Gastritis 06/2013 & 05/2015  . Gastroesophageal reflux disease with stricture 05/2015  . GERD (gastroesophageal reflux disease)   . HTN (hypertension)   . Hyperparathyroidism due to renal insufficiency (HCC)   . Hypertensive urgency 06/2013 & 04/2015  . Hypothyroidism   . ICH (intracerebral hemorrhage) (HCC) 10/2014  . Meningocele (HCC)   . Protein calorie malnutrition (HCC)   . Stroke (HCC)   . Uterine cancer (HCC) 2012  . Vertical diplopia   . Vertigo    Past Surgical History:  Past Surgical History:  Procedure Laterality Date  . ABDOMINAL AORTOGRAM N/A 04/05/2018   Procedure: ABDOMINAL AORTOGRAM;  Surgeon: Cephus Shelling, MD;  Location: [facility] INVASIVE CV LAB;  Service: Cardiovascular;  Laterality: N/A;  . ABDOMINAL HYSTERECTOMY    . APPENDECTOMY    . BASCILIC VEIN TRANSPOSITION Right 09/17/2013   Procedure: BRACHIAL VEIN TRANSPOSITION;  Surgeon: Fransisco Hertz, MD;  Location: [facility] OR;  Service: Vascular;  Laterality: Right;  . BASCILIC VEIN TRANSPOSITION Right 10/29/2013   Procedure: RIGHT SECOND STAGE BRACHIAL VEIN TRANSPOSITION;  Surgeon: Fransisco Hertz, MD;  Location: [facility] OR;  Service: Vascular;  Laterality: Right;  . CESAREAN SECTION    . CHOLECYSTECTOMY    . COLONOSCOPY  2000   TICS, IH  . COLONOSCOPY  2003 NUR BRBPR D50 V6   DC/[place] TICS, IH  . COLONOSCOPY N/A 09/04/2013   SLF: Small ulcer in the descending colon, one colon polyp (tubular adenoma), large  internal hemorrhoids, moderate diverticulosis. next tcs 10 years.  . ESOPHAGOGASTRODUODENOSCOPY N/A 05/23/2015   XBM:WUXL non-erosive gastritis/stricture at the gastroesophageal junction  . ESOPHAGOGASTRODUODENOSCOPY (EGD) WITH ESOPHAGEAL DILATION N/A 06/08/2013   Dr. Fields:Stricture at the gastroesophageal junction/MILD NON-erosive gastritis (inflammation) was found in the gastric antrum; multiple biopsies/NO SOURCE FOR ANEMIA IDETIFIED-MOST LIKELY DUE TO ANEMIA OF CHRONIC DISEASE  . FLEXIBLE SIGMOIDOSCOPY N/A 01/02/2016   Dr. Darrick Penna: external hemorrhoids, diverticulosis, internal hemorrhoids, proximal rectum normal  . GIVENS CAPSULE STUDY N/A 12/03/2013   SLF: occasional gastric erosion. small bowel normal  . HIP PINNING,CANNULATED Left 05/31/2015   Procedure: CANNULATED HIP PINNING;  Surgeon: Tarry Kos, MD;  Location: [facility] OR;  Service: Orthopedics;  Laterality: Left;  . INSERTION OF DIALYSIS CATHETER Left 09/17/2013   Procedure: INSERTION OF DIALYSIS CATHETER;  Surgeon: Fransisco Hertz, MD;  Location: [facility] OR;  Service: Vascular;  Laterality: Left;  . LAPAROSCOPIC TOTAL HYSTERECTOMY    . LOWER EXTREMITY ANGIOGRAPHY Bilateral 04/05/2018   Procedure: Lower Extremity Angiography;  Surgeon: Cephus Shelling, MD;  Location: [facility] INVASIVE CV LAB;  Service: Cardiovascular;  Laterality: Bilateral;  . NASAL SEPTUM SURGERY    . ORIF FEMUR FRACTURE Right 05/17/2018   Procedure: OPEN REDUCTION INTERNAL FIXATION (ORIF) DISTAL FEMUR FRACTURE;  Surgeon: Roby Lofts, MD;  Location: [facility] OR;  Service: Orthopedics;  Laterality: Right;  . PERIPHERAL VASCULAR ATHERECTOMY Right 04/05/2018   Procedure: PERIPHERAL VASCULAR ATHERECTOMY;  Surgeon:  Cephus Shelling, MD;  Location: [facility] INVASIVE CV LAB;  Service: Cardiovascular;  Laterality: Right;  Peroneal   . PERIPHERAL VASCULAR BALLOON ANGIOPLASTY Right 04/05/2018   Procedure: PERIPHERAL VASCULAR BALLOON ANGIOPLASTY;  Surgeon: Cephus Shelling, MD;  Location: [facility]  INVASIVE CV LAB;  Service: Cardiovascular;  Laterality: Right;  Posterior tibial  . SAVORY DILATION N/A 05/23/2015   Procedure: SAVORY DILATION;  Surgeon: West Bali, MD;  Location: [facility] ENDO SUITE;  Service: Endoscopy;  Laterality: N/A;  . THROMBECTOMY AND REVISION OF ARTERIOVENTOUS (AV) GORETEX  GRAFT Right 07/06/2017   Procedure: THROMBECTOMY AND REVISION OF ARTERIOVENOUS (AV) GORETEX  GRAFT RIGHT UPPER ARM;  Surgeon: Larina Earthly, MD;  Location: [facility] OR;  Service: Vascular;  Laterality: Right;   HPI:  67 year old female admitted 05/16/18 following a fall. ORIF 05/17/18. PMH: anemia, CKD, anxiety, ESRD (HD T/Th/Sa), diverticulitis, dysphagia, left femoral neck fx, GERD, HTN, hyperparathyroidism.renal insufficiency, hypothyroidism, ICH, meningocele, malnutrition, CVA, uterine cancer. MRI = Multiple small foci of acute ischemia: 1 in the right parietal lobe and 2-3 in the right cerebellum. No acute hemorrhage or mass effect. CXR = no active disease.       Assessment / Plan / Recommendation Clinical Impression  Pt was seen during lunch of regular solids and thin liquids. Pt requires encouragement to self feed. Daughter reports pt has a tendency to hold solids. No overt s/s aspiration observed on any consistency, however, pt requires a significant amount of time to chew and swallow solids. Recommend downgrading diet to Dys 2 and thin liquids for energy conservation. Encouraged pt/daughter to check oral cavity for oral residue/holding during and after meals to insure clearing. Safe swallow precautions posted at [facility] ST will follow for diet tolerance assessment and education. RN and MD informed of results and recommendations.    SLP Visit Diagnosis: Dysphagia, unspecified (R13.10)    Aspiration Risk  Mild aspiration risk    Diet Recommendation Dysphagia 2 (Fine chop);Thin liquid   Liquid Administration via: Straw;Cup Medication Administration: Whole meds with liquid Supervision: Staff to assist  with self feeding;Full supervision/cueing for compensatory strategies Compensations: Minimize environmental distractions;Slow rate;Small sips/bites;Lingual sweep for clearance of pocketing Postural Changes: Seated upright at 90 degrees    Other  Recommendations Oral Care Recommendations: Oral care BID   Follow up Recommendations (TBD)      Frequency and Duration min 1 x/week  1 week;2 weeks       Prognosis Prognosis for Safe Diet Advancement: Fair      Swallow Study   General Date of Onset: 05/16/18 HPI: 67 year old female admitted 05/16/18 following a fall. ORIF 05/17/18. PMH: anemia, CKD, anxiety, ESRD (HD T/Th/Sa), diverticulitis, dysphagia, left femoral neck fx, GERD, HTN, hyperparathyroidism.renal insufficiency, hypothyroidism, ICH, meningocele, malnutrition, CVA, uterine cancer. MRI = Multiple small foci of acute ischemia: 1 in the right parietal lobe and 2-3 in the right cerebellum. No acute hemorrhage or mass effect. CXR = no active disease.     Type of Study: Bedside Swallow Evaluation Previous Swallow Assessment: BSE at [facility] Feb 2019, with recommendation for regular diet/thin liquids Diet Prior to this Study: Regular;Thin liquids Temperature Spikes Noted: No Respiratory Status: Room air History of Recent Intubation: No Behavior/Cognition: Alert;Cooperative;Pleasant mood Oral Cavity Assessment: Within Functional Limits Oral Care Completed by SLP: No Oral Cavity - Dentition: Edentulous Vision: Functional for self-feeding Self-Feeding Abilities: Able to feed self;Needs assist;Needs set up Patient Positioning: Upright in chair Baseline Vocal Quality: Normal Volitional Cough: Strong Volitional Swallow: Able to  elicit    Oral/Motor/Sensory Function Overall Oral Motor/Sensory Function: Within functional limits   Ice Chips Ice chips: Not tested   Thin Liquid Thin Liquid: Within functional limits Presentation: Straw    Nectar Thick Nectar Thick Liquid: Not tested   Honey  Thick Honey Thick Liquid: Not tested   Puree Puree: Within functional limits Presentation: Spoon   Solid     Solid: Impaired Oral Phase Impairments: Impaired mastication Oral Phase Functional Implications: Prolonged oral transit;Impaired mastication;Oral residue;Oral holding     Janira Mandell B. Murvin Natal, [facility], CCC-SLP Speech Language Pathologist [phone]  Leigh Aurora 05/22/2018,2:50 PM

## 2018-05-22 NOTE — Progress Notes (Signed)
PROGRESS NOTE  Sonya [name] TWS:[id number] DOB: 1950-09-23 DOA: 05/16/2018 PCP: Redmond School, MD  HPI/Recap of past 24 hours: Sonya [name] is a 67 y.o. female with medical history of anemia of CKD, anxiety, ESRD on hemodialysis Tuesday/Thursday/Saturday, diverticulosis/diverticulitis, history of dysphagia, history of fracture of left femoral neck, GERD, hypertension, hyperparathyroidism due to renal insufficiency, hypothyroidism, intracerebral hemorrhage, meningocele, protein calorie malnutrition, ischemic stroke, history of uterine cancer presenting after mechanical fall going to the bathroom.  The patient states that she was using her walker.  She was trying to get up off the commode when her right leg "gave out", and the patient fell onto her right side.  The patient had extreme pain and was unable to bear any weight.  EMS was activated.  In the emergency department, x-rays of the hip revealed an acute comminuted fracture of the right femoral diaphysis with half shaft width displacement. Pt admitted for further management.   Today, pt denies any new complaints. Daughter at bedside. Reports slightly improved appetite.  Assessment/Plan: Active Problems:   Essential hypertension   Hyperparathyroidism due to renal insufficiency (HCC)   Hypothyroidism, adult   Hyperlipidemia   End stage renal failure on dialysis [facility])   Fracture of condyle of right femur, closed, initial encounter ([place])   Closed fracture of right femur, unspecified fracture morphology, initial encounter ([place])   Cerebral embolism with cerebral infarction  Acute comminuted fracture of the right femur s/p ORIF on 05/17/18 Orthopedics on board Pain management and DVT ppx as per ortho PT/OT on board  Acute metabolic encephalopathy likely 2/2 acute ischemia with ?underlying dementia Afebrile, no leukocytosis CT head unremarkable MRI showed multiple small foci of acute ischemia, one in R parietal lobe, 2-3 in right  cerebellum. No acute hemorrhage or mass effect LDL 41, A1c pending ECHO pending CTA head and neck pending EEG pending Neurology consulted, appreciate recs Continue ASA, lipitor, restarted plavix Freq neuro checks, cardiac telemetry Monitor closely  ESRD Nephrology on board HD as per nephrology  Anemia of CKD/Acute blood loss post op Baseline hemoglobin 11-12 Hemoglobin 7.6-->6.9, s/p 2U of PRBC on 05/20/18 IV iron on HD days Monitor closely, daily cbc  Secondary hyperparathyroidism Management per nephrology  Essential hypertension BP labile, usually soft during HD Held nifedipine for ??permissive HTN  Hyperlipidemia Continue statin  Hypothyroidism Continue synthroid  Poor appetite/?malnourished  Dietician on board, appreciate recs Started Megace as daughter stated it worked in the past Dietary supplements    Code Status: Full   Family Communication: Daughter at bedside  Disposition Plan: SNF   Consultants:  Orthopedics  Neurology  Procedures:  ORIF on 05/17/18  Antimicrobials:  None  DVT prophylaxis: Heparin   Objective: Vitals:   05/22/18 1115 05/22/18 1145 05/22/18 1152 05/22/18 1324  BP: (!) 108/48 (!) 116/53 128/89 (!) 145/61  Pulse: 96 89 (!) 107 (!) 103  Resp:   18 14  Temp:   98.4 F (36.9 C) 98.2 F (36.8 C)  TempSrc:   Oral Oral  SpO2:   100% 100%  Weight:   62.7 kg   Height:        Intake/Output Summary (Last 24 hours) at 05/22/2018 1536 Last data filed at 05/22/2018 1152 Gross per 24 hour  Intake -  Output 1900 ml  Net -1900 ml   Filed Weights   05/19/18 1141 05/22/18 0745 05/22/18 1152  Weight: 65.5 kg 64.4 kg 62.7 kg    Exam:   General: NAD, appears older than stated age, confused  Cardiovascular: S1, S2 present  Respiratory: CTAB  Abdomen: Soft, NT, ND, BS present  Musculoskeletal: No bilateral pedal edema noted  Skin: Normal  Psychiatry: Fair mood  Neurology: LUE/LLE 4/5, RUE 4/5, LLE  limited by recent surgery    Data Reviewed: CBC: Recent Labs  Lab 05/18/18 0246 05/19/18 0116 05/20/18 0426 05/21/18 0707 05/22/18 0557  WBC 8.0 8.6 7.8 7.1 7.0  NEUTROABS 6.4 6.7 5.3 5.0 4.4  HGB 8.5* 7.6* 6.9* 9.7* 9.4*  HCT 28.3* 24.5* 22.4* 30.1* 29.5*  MCV 83.5 81.4 80.9 81.6 81.7  PLT 233 228 241 241 254   Basic Metabolic Panel: Recent Labs  Lab 05/17/18 0740  05/17/18 1840 05/18/18 0246 05/19/18 0657 05/19/18 0713 05/21/18 0707 05/22/18 0557  NA 137  --  140 136 136  --  138 135  K 4.7  --  4.6 4.1 4.2  --  3.8 4.2  CL 101  --  102 98 96*  --  99 97*  CO2 22  --  21* 23 25  --  25 26  GLUCOSE 91  --  106* 110* 99  --  81 91  BUN 50*  --  19 23 44*  --  48* 65*  CREATININE 11.17*  --  5.43* 5.97* 7.58*  --  6.56* 7.58*  CALCIUM 7.5*   < > 7.8* 7.6* 8.0* 7.9* 8.5* 8.5*  PHOS 5.6*  --  5.6*  --  5.7*  --  3.8 3.5   < > = values in this interval not displayed.   GFR: Estimated Creatinine Clearance: 6.4 mL/min (A) (by C-G formula based on SCr of 7.58 mg/dL (H)). Liver Function Tests: Recent Labs  Lab 05/16/18 0620  05/17/18 0740 05/17/18 1840 05/19/18 0657 05/21/18 0707 05/22/18 0557  AST 13*  --   --   --   --   --   --   ALT 8  --   --   --   --   --   --   ALKPHOS 92  --   --   --   --   --   --   BILITOT 0.7  --   --   --   --   --   --   PROT 7.8  --   --   --   --   --   --   ALBUMIN 3.6   < > 2.9* 2.9* 2.7* 2.5* 2.3*   < > = values in this interval not displayed.   No results for input(s): LIPASE, AMYLASE in the last 168 hours. No results for input(s): AMMONIA in the last 168 hours. Coagulation Profile: Recent Labs  Lab 05/16/18 0620 05/17/18 0320  INR 1.20 1.32   Cardiac Enzymes: No results for input(s): CKTOTAL, CKMB, CKMBINDEX, TROPONINI in the last 168 hours. BNP (last 3 results) No results for input(s): PROBNP in the last 8760 hours. HbA1C: No results for input(s): HGBA1C in the last 72 hours. CBG: No results for input(s):  GLUCAP in the last 168 hours. Lipid Profile: Recent Labs    05/22/18 0557  CHOL 111  HDL 36*  LDLCALC 41  TRIG 172*  CHOLHDL 3.1   Thyroid Function Tests: No results for input(s): TSH, T4TOTAL, FREET4, T3FREE, THYROIDAB in the last 72 hours. Anemia Panel: No results for input(s): VITAMINB12, FOLATE, FERRITIN, TIBC, IRON, RETICCTPCT in the last 72 hours. Urine analysis:    Component Value Date/Time   COLORURINE YELLOW 04/14/2015 [place]  04/14/2015 1427   LABSPEC 1.020 04/14/2015 1427   PHURINE 8.5 (H) 04/14/2015 1427   GLUCOSEU 100 (A) 04/14/2015 1427   HGBUR SMALL (A) 04/14/2015 1427   BILIRUBINUR NEGATIVE 04/14/2015 1427   KETONESUR NEGATIVE 04/14/2015 1427   PROTEINUR >300 (A) 04/14/2015 1427   UROBILINOGEN 0.2 04/14/2015 1427   NITRITE NEGATIVE 04/14/2015 1427   LEUKOCYTESUR TRACE (A) 04/14/2015 1427   Sepsis Labs: @LABRCNTIP (procalcitonin:4,lacticidven:4)  ) Recent Results (from the past 240 hour(s))  Surgical PCR screen     Status: None   Collection Time: 05/16/18  6:11 PM  Result Value Ref Range Status   MRSA, PCR NEGATIVE NEGATIVE Final   Staphylococcus aureus NEGATIVE NEGATIVE Final    Comment: (NOTE) The Xpert SA Assay (FDA approved for NASAL specimens in patients 65 years of age and older), is one component of a comprehensive surveillance program. It is not intended to diagnose infection nor to guide or monitor treatment. Performed at [facility], [place] [address], [place],  [address]       Studies: No results found.  Scheduled Meds: . allopurinol  100 mg Oral Q Wed  . aspirin EC  81 mg Oral Daily  . atorvastatin  10 mg Oral q1800  . calcitRIOL  1 mcg Oral Q M,W,F-HD  . calcium acetate  667 mg Oral TID WC  . Chlorhexidine Gluconate Cloth  6 each Topical Q0600  . clopidogrel  75 mg Oral Daily  . darbepoetin (ARANESP) injection - DIALYSIS  150 mcg Intravenous Q Fri-HD  . feeding supplement  1 Container Oral TID BM    . gabapentin  100 mg Oral QHS  . heparin injection (subcutaneous)  5,000 Units Subcutaneous Q8H  . levothyroxine  50 mcg Oral QAC breakfast  . lidocaine-prilocaine  1 application Topical Q O,HF,GBM-2111  . megestrol  40 mg Oral Daily  . multivitamin  1 tablet Oral QHS  . pentoxifylline  400 mg Oral TID WC  . polyethylene glycol  17 g Oral BID  . senna-docusate  1 tablet Oral BID    Continuous Infusions: . ferric gluconate (FERRLECIT/NULECIT) IV Stopped (05/22/18 1451)     LOS: 6 days     Sonya Friendly, MD [facility]   If 7PM-7AM, please contact night-coverage [email or web address] 05/22/2018, 3:36 PM

## 2018-05-22 NOTE — Progress Notes (Addendum)
STROKE TEAM PROGRESS NOTE   INTERVAL HISTORY Her daughter and family are at the bedside.  She is just back from HD. Lived alone PTA. [place] getting off toilet after voiding, denied valsalva. Did not become dizzy, has recollection of event, no syncope or LOC. Does not remember being week. Agreeable to rehab at outlying facility as she cannot care for herself at home. Family agreeable as well.   Vitals:   05/22/18 1115 05/22/18 1145 05/22/18 1152 05/22/18 1324  BP: (!) 108/48 (!) 116/53 128/89 (!) 145/61  Pulse: 96 89 (!) 107 (!) 103  Resp:   18 14  Temp:   98.4 F (36.9 C) 98.2 F (36.8 C)  TempSrc:   Oral Oral  SpO2:   100% 100%  Weight:   62.7 kg   Height:        CBC:  Recent Labs  Lab 05/21/18 0707 05/22/18 0557  WBC 7.1 7.0  NEUTROABS 5.0 4.4  HGB 9.7* 9.4*  HCT 30.1* 29.5*  MCV 81.6 81.7  PLT 241 237    Basic Metabolic Panel:  Recent Labs  Lab 05/21/18 0707 05/22/18 0557  NA 138 135  K 3.8 4.2  CL 99 97*  CO2 25 26  GLUCOSE 81 91  BUN 48* 65*  CREATININE 6.56* 7.58*  CALCIUM 8.5* 8.5*  PHOS 3.8 3.5   Lipid Panel:     Component Value Date/Time   CHOL 111 05/22/2018 0557   TRIG 172 (H) 05/22/2018 0557   HDL 36 (L) 05/22/2018 0557   CHOLHDL 3.1 05/22/2018 0557   VLDL 34 05/22/2018 0557   LDLCALC 41 05/22/2018 0557   HgbA1c:  Lab Results  Component Value Date   HGBA1C 5.5 09/28/2017   Urine Drug Screen: No results found for: LABOPIA, COCAINSCRNUR, LABBENZ, AMPHETMU, THCU, LABBARB  Alcohol Level     Component Value Date/Time   ETH <10 09/26/2017 1547    IMAGING Mr Brain Wo Contrast  Result Date: 05/20/2018 CLINICAL DATA:  Altered mental status EXAM: MRI HEAD WITHOUT CONTRAST TECHNIQUE: Multiplanar, multiecho pulse sequences of the brain and surrounding structures were obtained without intravenous contrast. COMPARISON:  Head CT 05/20/2018 Brain MRI 09/26/2017 FINDINGS: BRAIN: There is a small focus of diffusion restriction within the right parietal  lobe, measuring approximately 4 mm. There are 2-3 punctate foci of diffusion restriction in the inferior right cerebellar hemisphere. The midline structures are normal. No midline shift or other mass effect. Bilateral old thalamic infarcts. Diffuse confluent hyperintense T2-weighted signal within the periventricular, deep and juxtacortical white matter, most commonly due to chronic ischemic microangiopathy. The cerebral and cerebellar volume are age-appropriate. There are numerous chronic microhemorrhages in a predominantly central distribution. VASCULAR: Major intracranial arterial and venous sinus flow voids are normal. SKULL AND UPPER CERVICAL SPINE: Calvarial bone marrow signal is normal. There is no skull base mass. Visualized upper cervical spine and soft tissues are normal. SINUSES/ORBITS: Right-greater-than-left mastoid effusions. Unchanged left sphenoid sinus meningoencephalocele. The orbits are normal. IMPRESSION: 1. Multiple small foci of acute ischemia: 1 in the right parietal lobe and 2-3 in the right cerebellum. No acute hemorrhage or mass effect. 2. 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 3. 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 4. Unchanged left sphenoid sinus meningoencephalocele. Electronically Signed   By: Ulyses Jarred M.D.   On: 05/20/2018 23:18   Dg Chest Port 1 View  Result Date: 05/20/2018 CLINICAL DATA:  Altered mental status. EXAM: PORTABLE CHEST 1 VIEW COMPARISON:  Radiograph of  May 16, 2018. FINDINGS: The heart size and mediastinal contours are within normal limits. Both lungs are clear. The visualized skeletal structures are unremarkable. IMPRESSION: No active disease. Electronically Signed   By: Marijo Conception, M.D.   On: 05/20/2018 15:52    PHYSICAL EXAM HEENT-  Normocephalic, no lesions, without obvious abnormality.  Normal external eye and conjunctiva.  Cardiovascular- S1-S2 audible, pulses palpable  throughout   Lungs-no rhonchi or wheezing noted, no excessive working breathing.  Saturations within normal limits on RA Extremities- Warm, dry and intact Musculoskeletal-no joint tenderness, deformity or swelling Skin-warm and dry. R lateral thigh dressing CD&I  Neurological Examination Mental Status: Alert, oriented to person/ age/hospital. Thinks she is at [facility] and the year is 1919. thought content appropriate.  able to name, repeat, follow commands. Speech hesitant with naming. Slight dysarthria.  Cranial Nerves: II:  Visual fields grossly normal,  III,IV, VI: ptosis not present, extra-ocular motions intact bilaterally, pupils equal, round, reactive to light and accommodation V,VII: smile asymmetric, slight left facial droop, facial light touch sensation normal bilaterally VIII: hearing normal bilaterally IX,X: uvula rises symmetrically XI: bilateral shoulder shrug XII: midline tongue extension Motor: Right :  Upper extremity   4/5              Left:   Upper extremity   4+/5             Lower extremity   2/5                          Lower extremity   4+/5 L arm drift. RLE ROM limited by recent surgery Tone and bulk:normal tone throughout; no atrophy noted Sensory: light touch intact throughout, bilaterally Plantars: Right: downgoing                                Left: downgoing Cerebellar: clumsy R finger-to-nose, L normal.  unable to perform HTS d/t surgery Gait: deferred Other: no subtle jerking of distal RUE and RLE is noted on today's exam.   ASSESSMENT/PLAN Ms. LENI PANKONIN is a 67 y.o. female with history of ESRD on HD, HTN, ICH in 2016, Uterine cancer presenting to [facility] following fall off BSC w/ fx R femur. S/P ORIF 05/17/2018 with confusion, slurred speech and L hand weakness.   Stroke:  3 punctate right brain infarcts felt to be due to hypotension in setting of known large vessel disease. Potentially occurred just prior to fall. Doubt embolic source but  cannot rule out.   CT head No acute stroke. Stable advanced small vessel disease and atrophy. Asymmetric R medial temporal lobe atrophy. Chronic L sphenic sinus opacification, known meningoencephalocele.  MRI  3 punctate infarcts (2-3 R cerebellum, 1 R parietal) numerous chronic microhemmorrhages. Small vessel disease. Atrophy. L sphenic sinus meningoencephalocele.  CTA head & neck pending   2D Echo  pending   EEG pending   LDL 41  HgbA1c pending   Heparin 5000 units sq tid for VTE prophylaxis  aspirin 81 mg daily and clopidogrel 75 mg daily prior to admission, now on aspirin 81 mg daily and clopidogrel 75 mg daily. Continue DAPT at d/c  Cardiac telemetry ordered this am, not on in room as just back from HD. Agree with need for IP tele to look for possible embolic source  30 day monitor to rule out potential embolic source. Discussed w/ pt and  family.  Therapy recommendations:  Pending. WBAT.  Disposition:  SNF (rehab at SNF, lived alone PTA with no one able to provide care at d/c)  Hypertension  Variable - BP as low as 82/59 up to 145/51  Given large vessel disease, recommend keeping SBP >100 during HD . Long-term BP goal normotensive  Hyperlipidemia  Home meds:  lipitor 10  Now on lipitor 40  LDL 41, goal < 70  Decrease lipitor back to 10mg  daily  Continue statin at discharge    Other Stroke Risk Factors  Advanced age  Hx stroke/TIA  09/2017: patient presented to [facility] for AMS and was found to have punctate inferior medial right temporal lobe w/ B IVA siphon & VA disease.  10/2014: L external capsule ICH, no residual deficits. EEG showed L temporal sharpe waves at that time. Not treated w/ antiepileptics  Other Active Problems  ESRD on HD TTS  R femur fx s/p mechanical fall s/p ORIF 05/17/18  Hospital day # Meta, MSN, APRN, ANVP-BC, AGPCNP-BC Advanced Practice Stroke Nurse [place] for Schedule & Pager  information 05/22/2018 1:58 PM   ATTENDING NOTE: I reviewed above note and agree with the assessment and plan. Pt was seen and examined.   67 year old female with history of ESRD on hemodialysis, hypertension, anemia, PAD status post stent admitted for fall status post right femur ORIF and subsequently developed confusion and slurred speech and bilateral upper extremity weakness right more than left.  Patient had history of stroke.  On 10/14/2014 patient admitted for dizziness and hyper tensive urgency.  CT showed subacute left external capsule small ICH which confirmed on MRI.  On 10/17/2014 patient readmitted for confusion after discharge but CT no change, however EEG showed general slowing but left temporal intermittent slowing and sharp waves.  No AEDs given.  In 09/2017 patient admitted for altered mental status, confusion and slurred speech.  CT negative.  MRI showed punctate right inferior mesial temporal lobe infarct.  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  Carotid Doppler negative EF 65 to 70%.  LDL 93 and A1c 5.5.  Patient discharged with aspirin.  Recently, patient was diagnosed with PAD of lower extremities and status post stent placement, patient was put on aspirin and Plavix DAPT treatment.  On this admission, as per granddaughter, patient was on toilet, when she got up she had a fall and had a femur fracture.  Prior to surgery, patient seems neurologically intact as per granddaughter.  However, after surgery, patient was drowsy sleepy, not able to do much.  Yesterday patient seemed more awake alert, however it was found to have weakness of upper extremities, right more than left.  CT no acute abnormality.  MRI showed 2 right cerebellum and 1 right parietal punctate infarcts.  CTA head and neck pending.  2D echo pending.  An EEG pending. LDL 41 and A1c pending.  Recommend to check orthostatic vital once patient able to bear  weight.  On exam, patient sitting in chair, orientated to place and people, however not to time, with mild perseverations.  Limited neuro exam due to not quite cooperative on exam.  However, blinking to visual threat bilaterally, PERRL, EOMI.  No significant facial asymmetry, tongue midline.  Right upper extremity pronator drift, with 3+/5 proximal, 3/5 wrist extension and finger grip.  Left upper extremity 4-4+/5.  Left lower extremity 3/5 proximal and 4/5 distal.  Right lower  extremity proximal not able to check due to pain status post surgery, but distally only 3+/5.  Sensation and coordination not cooperative, gait not tested.  Bilateral upper extremity DTR 2+, no Hoffmann.  Bilateral lower extremity ankle reflexes diminished, negative Babinski.  Patient stroke on MRI could be due to hypoperfusion on standing up given right VA occlusion and bilateral siphon severe stenosis in CTA in 09/2017.  However, patient MRI so far cannot explain patient neurological deficit at this time.  Would recommend repeat limited MRI with DWI and ADC only, and also MRI C-spine to rule out spinal cord compression after fall.  Will also do LE venous Doppler to rule out DVT post lower extremity surgery.  Continue aspirin and Plavix DAPT for stroke prevention, continue statin.  Will follow.   Rosalin Hawking, MD PhD Stroke Neurology 05/22/2018 6:09 PM   I spent  35 minutes in total face-to-face time with the patient, more than 50% of which was spent in counseling and coordination of care, reviewing test results, images and medication, and discussing the diagnosis of stroke, history of stroke, treatment plan and potential prognosis. This patient's care requiresreview of multiple databases, neurological assessment, discussion with family, other specialists and medical decision making of high complexity.    To contact Stroke Continuity provider, please refer to [email or web address] After hours, contact General Neurology

## 2018-05-23 ENCOUNTER — Inpatient Hospital Stay (HOSPITAL_COMMUNITY): Payer: Medicare Other

## 2018-05-23 DIAGNOSIS — I5030 Unspecified diastolic (congestive) heart failure: Secondary | ICD-10-CM

## 2018-05-23 DIAGNOSIS — W19XXXA Unspecified fall, initial encounter: Secondary | ICD-10-CM

## 2018-05-23 DIAGNOSIS — I63541 Cerebral infarction due to unspecified occlusion or stenosis of right cerebellar artery: Secondary | ICD-10-CM

## 2018-05-23 DIAGNOSIS — I639 Cerebral infarction, unspecified: Secondary | ICD-10-CM

## 2018-05-23 DIAGNOSIS — S72351A Displaced comminuted fracture of shaft of right femur, initial encounter for closed fracture: Secondary | ICD-10-CM

## 2018-05-23 DIAGNOSIS — I63233 Cerebral infarction due to unspecified occlusion or stenosis of bilateral carotid arteries: Secondary | ICD-10-CM

## 2018-05-23 LAB — ECHOCARDIOGRAM COMPLETE
Height: 65 in
Weight: 2211.65 oz

## 2018-05-23 LAB — HEMOGLOBIN A1C
HEMOGLOBIN A1C: 5.2 % (ref 4.8–5.6)
Mean Plasma Glucose: 103 mg/dL

## 2018-05-23 NOTE — Procedures (Signed)
ELECTROENCEPHALOGRAM REPORT   Patient: Sonya [name]       Room #: 5N29C EEG No. ID: [id number] Age: 67 y.o.        Sex: female Referring Physician: Horris Latino Report Date:  05/23/2018        Interpreting Physician: Alexis Goodell  History: [name] is an 67 y.o. female with confusion  Medications:  Zyloprim, ASA, Lipitor, Rocaltrol, Phoslo, Plavix, Aranesp, Neurontin, Synthroid, Megace, MVI, Trental, Miralax, Senokot  Conditions of Recording:  This is a 21 channel routine scalp EEG performed with bipolar and monopolar montages arranged in accordance to the international 10/20 system of electrode placement. One channel was dedicated to EKG recording.  The patient is in the awake and drowsy states.  Description:  The waking background activity consists of a low voltage, symmetrical, fairly well organized, 8 Hz alpha activity, seen from the parieto-occipital and posterior temporal regions.  Low voltage fast activity, poorly organized, is seen anteriorly and is at times superimposed on more posterior regions.  A mixture of theta and alpha rhythms are seen from the central and temporal regions. The patient drowses with slowing to irregular, low voltage theta and beta activity.  Frequent intermittent periodic discharges of triphasic morphology are noted bi-hemispherically.    Stage II sleep is not obtained. Hyperventilation and intermittent photic stimulation were not performed.   IMPRESSION: This is an abnormal electroencephalogram due to the presence of periodic discharges of triphasic morphology.  These are seen most commonly in encephalopathic states.  Although most often seen with hepatic encephalopathies, they are not isolated to this clinical scenario.   Alexis Goodell, MD Neurology [phone] 05/23/2018, 1:14 PM

## 2018-05-23 NOTE — Clinical Social Work Note (Signed)
Clinical Social Work Assessment  Patient Details  Name: Sonya [name] MRN: [id number] Date of Birth: [birth year]  Date of referral:  05/23/18               Reason for consult:  Discharge Planning, Care Management Concerns                Permission sought to share information with:  Case Manager, Facility Sport and exercise psychologist, Family Supports Permission granted to share information::  Yes, Verbal Permission Granted  Name::     Oceanographer::  SNFs  Relationship::  Daughter  Contact Information:  [phone]  Housing/Transportation Living arrangements for the past 2 months:  [place] of Information:  Patient Patient Interpreter Needed:  None Criminal Activity/Legal Involvement Pertinent to Current Situation/Hospitalization:  No - Comment as needed Significant Relationships:  Adult Children Lives with:  Self Do you feel safe going back to the place where you live?  No Need for family participation in patient care:  Yes (Comment)  Care giving concerns:  CSW received referral for possible SNF placement at time of discharge. Spoke with patient's daughter Joanne Chars regarding possibility of SNF placement . Patient's  daughter  is currently unable to care for her at their home given patient's current needs and fall risk.  Patient's daughter  expressed understanding of PT recommendation and are agreeable to SNF placement at time of discharge. CSW to continue to follow and assist with discharge planning needs.     Social Worker assessment / plan:  Spoke with patient's daughter concerning possibility of rehab at [facility] before returning home.    Employment status:  Retired Forensic scientist:  Medicare PT Recommendations:  [place] / Referral to community resources:  [place]  Patient/Family's Response to care:  Patient's daughter  recognizes need for rehab before returning home and are agreeable to a SNF with reported preference  for  [facility]  . CSW explained insurance authorization process.  CSW also explained possible barriers to SNF placements including patient's dialysis schedule and transportation needed. Patient's family reported that they want patient to get stronger to be able to come back home.    Patient/Family's Understanding of and Emotional Response to Diagnosis, Current Treatment, and Prognosis:  Patient/family is realistic regarding therapy needs and expressed being hopeful for SNF placement. Patient expressed understanding of CSW role and discharge process as well as medical condition. No questions/concerns about plan or treatment.    Emotional Assessment Appearance:  Appears stated age Attitude/Demeanor/Rapport:  Unable to Assess Affect (typically observed):  Unable to Assess Orientation:  Oriented to Self Alcohol / Substance use:  Not Applicable Psych involvement (Current and /or in the community):  No (Comment)  Discharge Needs  Concerns to be addressed:  Discharge Planning Concerns Readmission within the last 30 days:  No Current discharge risk:  Dependent with Mobility Barriers to Discharge:  Continued Medical Work up   [employer], LCSW 05/23/2018, 10:04 AM

## 2018-05-23 NOTE — Progress Notes (Signed)
PROGRESS NOTE  Sonya [name] SWN:[id number] DOB: [birth year] DOA: 05/16/2018 PCP: Redmond School, MD  HPI/Recap of past 24 hours: Sonya [name] is a 67 y.o. female with medical history of anemia of CKD, anxiety, ESRD on hemodialysis Tuesday/Thursday/Saturday, diverticulosis/diverticulitis, history of dysphagia, history of fracture of left femoral neck, GERD, hypertension, hyperparathyroidism due to renal insufficiency, hypothyroidism, intracerebral hemorrhage, meningocele, protein calorie malnutrition, ischemic stroke, history of uterine cancer presenting after mechanical fall going to the bathroom.  The patient states that she was using her walker.  She was trying to get up off the commode when her right leg "gave out", and the patient fell onto her right side.  The patient had extreme pain and was unable to bear any weight.  EMS was activated.  In the emergency department, x-rays of the hip revealed an acute comminuted fracture of the right femoral diaphysis with half shaft width displacement. Pt admitted for further management.   Today, pt seems to be more awake, alert, able to move a little better with PT/OT. Seems to be getting close to her baseline. Noted R thigh swelling and pain.  Assessment/Plan: Active Problems:   Essential hypertension   Hyperparathyroidism due to renal insufficiency (HCC)   Hypothyroidism, adult   Hyperlipidemia   End stage renal failure on dialysis [facility])   Fracture of condyle of right femur, closed, initial encounter ([place])   Closed fracture of right femur, unspecified fracture morphology, initial encounter ([place])   Cerebral embolism with cerebral infarction  Acute comminuted fracture of the right femur s/p ORIF on 05/17/18 Orthopedics on board Pain management and DVT ppx as per ortho PT/OT on board  Acute metabolic encephalopathy likely 2/2 acute ischemia with ?underlying dementia Afebrile, no leukocytosis CT head unremarkable MRI showed multiple small  foci of acute ischemia, one in R parietal lobe, 2-3 in right cerebellum. No acute hemorrhage or mass effect LDL 41,   A1c 5.2 ECHO done on 05/23/18 showed EF of 65-70%, Grade 1DD, PA peak pressure 36 mm Hg. Overall, image quality was poor CTA head and neck showed no emergent large vessel occlusion. Chronically occluded RIGHT V4 segment. Multifocal severe ICA stenosis. Multifocal cerebral artery severe stenosis. EEG showed presence of periodic discharges of triphasic morphology.  These are seen most commonly in encephalopathic states.  Although most often seen with hepatic encephalopathies, they are not isolated to this clinical scenario. BLE venous duplex: Negative for DVT MRI spine w/o contrast: Negative for age cervical spine MRI with no spinal stenosis or cervical spinal cord abnormality  Neurology consulted, appreciate recs Continue ASA, lipitor, restarted plavix Continue cardiac telemetry Monitor closely  ESRD Nephrology on board HD as per nephrology  Anemia of CKD/Acute blood loss post op Baseline hemoglobin 11-12 Hemoglobin 7.6-->6.9, s/p 2U of PRBC on 05/20/18 IV iron on HD days Monitor closely, daily cbc  Secondary hyperparathyroidism Management per nephrology  Essential hypertension BP labile, usually soft during HD Held nifedipine for ??permissive HTN  Hyperlipidemia Continue statin  Hypothyroidism Continue synthroid  Poor appetite/?malnourished  Dietician on board, appreciate recs Started Megace as daughter stated it worked in the past Dietary supplements    Code Status: Full   Family Communication: Daughter at bedside  Disposition Plan: SNF   Consultants:  Orthopedics  Neurology  Procedures:  ORIF on 05/17/18  Antimicrobials:  None  DVT prophylaxis: Heparin   Objective: Vitals:   05/22/18 1152 05/22/18 1324 05/22/18 1900 05/23/18 0504  BP: 128/89 (!) 145/61 139/66 (!) 160/72  Pulse: (!) 107 Marland Kitchen)  103 96 94  Resp: 18 14 16 12     Temp: 98.4 F (36.9 C) 98.2 F (36.8 C) 98.8 F (37.1 C) 98.8 F (37.1 C)  TempSrc: Oral Oral Oral Oral  SpO2: 100% 100% 100% 100%  Weight: 62.7 kg     Height:        Intake/Output Summary (Last 24 hours) at 05/23/2018 1231 Last data filed at 05/22/2018 1730 Gross per 24 hour  Intake 120 ml  Output -  Net 120 ml   Filed Weights   05/19/18 1141 05/22/18 0745 05/22/18 1152  Weight: 65.5 kg 64.4 kg 62.7 kg    Exam:   General: NAD, appears older than stated age  Cardiovascular: S1, S2 present  Respiratory: CTAB  Abdomen: Soft, NT, ND, BS present  Musculoskeletal: No bilateral pedal edema noted  Skin: Normal  Psychiatry: [place] mood  Neurology: LUE/LLE 4/5, RUE 4/5, LLE limited by recent surgery    Data Reviewed: CBC: Recent Labs  Lab 05/18/18 0246 05/19/18 0116 05/20/18 0426 05/21/18 0707 05/22/18 0557  WBC 8.0 8.6 7.8 7.1 7.0  NEUTROABS 6.4 6.7 5.3 5.0 4.4  HGB 8.5* 7.6* 6.9* 9.7* 9.4*  HCT 28.3* 24.5* 22.4* 30.1* 29.5*  MCV 83.5 81.4 80.9 81.6 81.7  PLT 233 228 241 241 829   Basic Metabolic Panel: Recent Labs  Lab 05/17/18 0740  05/17/18 1840 05/18/18 0246 05/19/18 0657 05/19/18 0713 05/21/18 0707 05/22/18 0557  NA 137  --  140 136 136  --  138 135  K 4.7  --  4.6 4.1 4.2  --  3.8 4.2  CL 101  --  102 98 96*  --  99 97*  CO2 22  --  21* 23 25  --  25 26  GLUCOSE 91  --  106* 110* 99  --  81 91  BUN 50*  --  19 23 44*  --  48* 65*  CREATININE 11.17*  --  5.43* 5.97* 7.58*  --  6.56* 7.58*  CALCIUM 7.5*   < > 7.8* 7.6* 8.0* 7.9* 8.5* 8.5*  PHOS 5.6*  --  5.6*  --  5.7*  --  3.8 3.5   < > = values in this interval not displayed.   GFR: Estimated Creatinine Clearance: 6.4 mL/min (A) (by C-G formula based on SCr of 7.58 mg/dL (H)). Liver Function Tests: Recent Labs  Lab 05/17/18 0740 05/17/18 1840 05/19/18 0657 05/21/18 0707 05/22/18 0557  ALBUMIN 2.9* 2.9* 2.7* 2.5* 2.3*   No results for input(s): LIPASE, AMYLASE in the last 168  hours. No results for input(s): AMMONIA in the last 168 hours. Coagulation Profile: Recent Labs  Lab 05/17/18 0320  INR 1.32   Cardiac Enzymes: No results for input(s): CKTOTAL, CKMB, CKMBINDEX, TROPONINI in the last 168 hours. BNP (last 3 results) No results for input(s): PROBNP in the last 8760 hours. HbA1C: Recent Labs    05/22/18 0557  HGBA1C 5.2   CBG: No results for input(s): GLUCAP in the last 168 hours. Lipid Profile: Recent Labs    05/22/18 0557  CHOL 111  HDL 36*  LDLCALC 41  TRIG 172*  CHOLHDL 3.1   Thyroid Function Tests: No results for input(s): TSH, T4TOTAL, FREET4, T3FREE, THYROIDAB in the last 72 hours. Anemia Panel: No results for input(s): VITAMINB12, FOLATE, FERRITIN, TIBC, IRON, RETICCTPCT in the last 72 hours. Urine analysis:    Component Value Date/Time   COLORURINE YELLOW 04/14/2015 1427   APPEARANCEUR CLEAR 04/14/2015 1427   LABSPEC 1.020  04/14/2015 1427   PHURINE 8.5 (H) 04/14/2015 1427   GLUCOSEU 100 (A) 04/14/2015 1427   HGBUR SMALL (A) 04/14/2015 1427   BILIRUBINUR NEGATIVE 04/14/2015 1427   KETONESUR NEGATIVE 04/14/2015 1427   PROTEINUR >300 (A) 04/14/2015 1427   UROBILINOGEN 0.2 04/14/2015 1427   NITRITE NEGATIVE 04/14/2015 1427   LEUKOCYTESUR TRACE (A) 04/14/2015 1427   Sepsis Labs: @LABRCNTIP (procalcitonin:4,lacticidven:4)  ) Recent Results (from the past 240 hour(s))  Surgical PCR screen     Status: None   Collection Time: 05/16/18  6:11 PM  Result Value Ref Range Status   MRSA, PCR NEGATIVE NEGATIVE Final   Staphylococcus aureus NEGATIVE NEGATIVE Final    Comment: (NOTE) The Xpert SA Assay (FDA approved for NASAL specimens in patients 82 years of age and older), is one component of a comprehensive surveillance program. It is not intended to diagnose infection nor to guide or monitor treatment. Performed at [facility], [place] [address] [place], [place] [address]       Studies: Ct Angio Head W Or Wo  Contrast  Result Date: 05/22/2018 CLINICAL DATA:  Follow up stroke. History of intracranial hemorrhage, hypertension, end-stage renal disease on dialysis, uterine cancer. EXAM: CT ANGIOGRAPHY HEAD AND NECK TECHNIQUE: Multidetector CT imaging of the head and neck was performed using the standard protocol during bolus administration of intravenous contrast. Multiplanar CT image reconstructions and MIPs were obtained to evaluate the vascular anatomy. Carotid stenosis measurements (when applicable) are obtained utilizing NASCET criteria, using the distal internal carotid diameter as the denominator. CONTRAST:  85mL ISOVUE-370 IOPAMIDOL (ISOVUE-370) INJECTION 76% COMPARISON:  MRI head May 20, 2018 and MRA head September 26, 2017 FINDINGS: CT HEAD FINDINGS BRAIN: No intraparenchymal hemorrhage, mass effect nor midline shift. No advanced parenchymal brain volume loss for age. No hydrocephalus. Confluent supratentorial white matter hypodensities. Old bilateral basal ganglia and thalami lacunar infarcts. Acute large vascular territory infarcts. No abnormal extra-axial fluid collections. Scattered punctate, possibly intraparenchymal calcifications. LEFT temporal lobe encephalomalacia associated with large meningoencephalocele extending to LEFT sphenoid sinus. Basal cisterns are patent. VASCULAR: Severe calcific atherosclerosis of the carotid siphons. SKULL: No skull fracture. No significant scalp soft tissue swelling. SINUSES/ORBITS: Trace paranasal sinus mucosal thickening. Small RIGHT mastoid effusion. Asymmetrically smaller LEFT optic nerve, query blindness. OTHER: Patient is edentulous. CTA NECK FINDINGS: AORTIC ARCH: Normal appearance of the thoracic arch, normal branch pattern. Mild calcific atherosclerosis aortic arch. The origins of the innominate, left Common carotid artery and subclavian artery are widely patent. RIGHT CAROTID SYSTEM: Common carotid artery is patent. Calcific atherosclerosis resulting in less  than 50% stenosis by NASCET criteria. Patent internal carotid artery. LEFT CAROTID SYSTEM: Common carotid artery is patent. Normal appearance of the carotid bifurcation without hemodynamically significant stenosis by NASCET criteria. Normal appearance of the internal carotid artery. VERTEBRAL ARTERIES:Thready irregular RIGHT vertebral artery from the origin distally, occluded at distal V3 segment. Multifocal severe stenosis and calcific atherosclerosis LEFT vertebral artery. SKELETON: No acute osseous process though bone windows have not been submitted. OTHER NECK: Soft tissues of the neck are nonacute though, not tailored for evaluation. UPPER CHEST: Included lung apices are clear. No superior mediastinal lymphadenopathy. CTA HEAD FINDINGS: ANTERIOR CIRCULATION: Patent cervical internal carotid arteries, petrous, cavernous and supra clinoid internal carotid arteries. Severe stenosis RIGHT petrous segment due to atherosclerosis. Moderate bilateral cavernous stenosis and severe bilateral paraclinoid stenosis. Moderate stenosis patent anterior communicating artery. Patent anterior and middle cerebral arteries, moderate luminal irregularity compatible with atherosclerosis. Severe stenosis LEFT M2 segment, LEFT A2  segment. No large vessel occlusion, contrast extravasation or aneurysm. POSTERIOR CIRCULATION: Occluded RIGHT vertebral artery with minimal retrograde flow distal RIGHT V4 segment. Multifocal severe stenosis LEFT V4 segment. Moderate stenosis diminutive basilar artery. Small RIGHT and possible LEFT posterior communicating artery present. Patent posterior cerebral arteries, moderate luminal irregularity compatible with atherosclerosis. Severe stenosis RIGHT P1 and LEFT P2 segments. No contrast extravasation or aneurysm. VENOUS SINUSES: Major dural venous sinuses are patent though not tailored for evaluation on this angiographic examination. ANATOMIC VARIANTS: None. DELAYED PHASE: No abnormal intracranial  enhancement. MIP images reviewed. IMPRESSION: CT HEAD: 1. No acute intracranial process; known acute small infarct not apparent. 2. Severe chronic small vessel ischemic changes and multiple old small infarcts. 3. Large LEFT temporal pseudomeningocele. CTA NECK: 1. Less than 50% stenosis bilateral ICA. 2. Chronic RIGHT vertebral artery dissection, occluded distal RIGHT V3 segment. 3. Severe atherosclerosis versus chronic dissection LEFT vertebral artery with multifocal severe stenosis. CTA HEAD: 1. No emergent large vessel occlusion. Chronically occluded RIGHT V4 segment. 2. Multifocal severe ICA stenosis. Multifocal cerebral artery severe stenosis. Electronically Signed   By: Elon Alas M.D.   On: 05/22/2018 22:03   Ct Angio Neck W Or Wo Contrast  Result Date: 05/22/2018 CLINICAL DATA:  Follow up stroke. History of intracranial hemorrhage, hypertension, end-stage renal disease on dialysis, uterine cancer. EXAM: CT ANGIOGRAPHY HEAD AND NECK TECHNIQUE: Multidetector CT imaging of the head and neck was performed using the standard protocol during bolus administration of intravenous contrast. Multiplanar CT image reconstructions and MIPs were obtained to evaluate the vascular anatomy. Carotid stenosis measurements (when applicable) are obtained utilizing NASCET criteria, using the distal internal carotid diameter as the denominator. CONTRAST:  13mL ISOVUE-370 IOPAMIDOL (ISOVUE-370) INJECTION 76% COMPARISON:  MRI head May 20, 2018 and MRA head September 26, 2017 FINDINGS: CT HEAD FINDINGS BRAIN: No intraparenchymal hemorrhage, mass effect nor midline shift. No advanced parenchymal brain volume loss for age. No hydrocephalus. Confluent supratentorial white matter hypodensities. Old bilateral basal ganglia and thalami lacunar infarcts. Acute large vascular territory infarcts. No abnormal extra-axial fluid collections. Scattered punctate, possibly intraparenchymal calcifications. LEFT temporal lobe  encephalomalacia associated with large meningoencephalocele extending to LEFT sphenoid sinus. Basal cisterns are patent. VASCULAR: Severe calcific atherosclerosis of the carotid siphons. SKULL: No skull fracture. No significant scalp soft tissue swelling. SINUSES/ORBITS: Trace paranasal sinus mucosal thickening. Small RIGHT mastoid effusion. Asymmetrically smaller LEFT optic nerve, query blindness. OTHER: Patient is edentulous. CTA NECK FINDINGS: AORTIC ARCH: Normal appearance of the thoracic arch, normal branch pattern. Mild calcific atherosclerosis aortic arch. The origins of the innominate, left Common carotid artery and subclavian artery are widely patent. RIGHT CAROTID SYSTEM: Common carotid artery is patent. Calcific atherosclerosis resulting in less than 50% stenosis by NASCET criteria. Patent internal carotid artery. LEFT CAROTID SYSTEM: Common carotid artery is patent. Normal appearance of the carotid bifurcation without hemodynamically significant stenosis by NASCET criteria. Normal appearance of the internal carotid artery. VERTEBRAL ARTERIES:Thready irregular RIGHT vertebral artery from the origin distally, occluded at distal V3 segment. Multifocal severe stenosis and calcific atherosclerosis LEFT vertebral artery. SKELETON: No acute osseous process though bone windows have not been submitted. OTHER NECK: Soft tissues of the neck are nonacute though, not tailored for evaluation. UPPER CHEST: Included lung apices are clear. No superior mediastinal lymphadenopathy. CTA HEAD FINDINGS: ANTERIOR CIRCULATION: Patent cervical internal carotid arteries, petrous, cavernous and supra clinoid internal carotid arteries. Severe stenosis RIGHT petrous segment due to atherosclerosis. Moderate bilateral cavernous stenosis and severe bilateral paraclinoid stenosis. Moderate stenosis patent  anterior communicating artery. Patent anterior and middle cerebral arteries, moderate luminal irregularity compatible with  atherosclerosis. Severe stenosis LEFT M2 segment, LEFT A2 segment. No large vessel occlusion, contrast extravasation or aneurysm. POSTERIOR CIRCULATION: Occluded RIGHT vertebral artery with minimal retrograde flow distal RIGHT V4 segment. Multifocal severe stenosis LEFT V4 segment. Moderate stenosis diminutive basilar artery. Small RIGHT and possible LEFT posterior communicating artery present. Patent posterior cerebral arteries, moderate luminal irregularity compatible with atherosclerosis. Severe stenosis RIGHT P1 and LEFT P2 segments. No contrast extravasation or aneurysm. VENOUS SINUSES: Major dural venous sinuses are patent though not tailored for evaluation on this angiographic examination. ANATOMIC VARIANTS: None. DELAYED PHASE: No abnormal intracranial enhancement. MIP images reviewed. IMPRESSION: CT HEAD: 1. No acute intracranial process; known acute small infarct not apparent. 2. Severe chronic small vessel ischemic changes and multiple old small infarcts. 3. Large LEFT temporal pseudomeningocele. CTA NECK: 1. Less than 50% stenosis bilateral ICA. 2. Chronic RIGHT vertebral artery dissection, occluded distal RIGHT V3 segment. 3. Severe atherosclerosis versus chronic dissection LEFT vertebral artery with multifocal severe stenosis. CTA HEAD: 1. No emergent large vessel occlusion. Chronically occluded RIGHT V4 segment. 2. Multifocal severe ICA stenosis. Multifocal cerebral artery severe stenosis. Electronically Signed   By: Elon Alas M.D.   On: 05/22/2018 22:03   Mr Brain Wo Contrast  Result Date: 05/23/2018 CLINICAL DATA:  67 year old female with altered mental status, weakness and dizziness with recent fall. Several small acute infarcts in the right cerebellum and parietal lobe on recent brain MRI. Query new infarct or spinal cord compression from fall. History of dialysis, sphenoid sinus meningocele EXAM: MRI HEAD WITHOUT CONTRAST MRI CERVICAL SPINE WITHOUT CONTRAST TECHNIQUE: Multiplanar,  multiecho pulse sequences of the brain and surrounding structures, and cervical spine, to include the craniocervical junction and cervicothoracic junction, were obtained without intravenous contrast. COMPARISON:  CTA head and neck 05/22/2018. Brain MRI 05/20/2018 and earlier. FINDINGS: MRI HEAD FINDINGS Brain: Stable small foci of restricted diffusion in the right cerebellar tonsil and along the posterior right sylvian fissure on series 5. No new restricted diffusion to suggest acute infarction. Chronic patchy and confluent cerebral white matter T2 and FLAIR hyperintensity. Chronic T2 heterogeneity throughout the deep gray matter nuclei compatible with chronic lacunar infarcts. Patchy and confluent chronic T2 hyperintensity in the pons. Numerous chronic micro hemorrhages scattered primarily in the deep gray matter, brainstem, and cerebellum are stable. Stable cortical encephalomalacia in the anterior left temporal lobe in association with the large left skull base meningoencephalocele (series 15, image 21) which has not significantly changed since 2018. No new signal abnormality. No midline shift, mass effect, evidence of mass lesion, ventriculomegaly, extra-axial collection or acute intracranial hemorrhage. Cervicomedullary junction and pituitary are within normal limits. Vascular: Major intracranial vascular flow voids are stable from the recent CTA findings including evidence of distal right vertebral artery poor flow or occlusion on series 10, image 3. The other major vascular flow voids are preserved and appear stable since 2018. Skull and upper cervical spine: Cervical spine described below. Visualized bone marrow signal is within normal limits. Sinuses/Orbits: Stable and negative orbits. Large CSF containing meningoencephalocele occupying the left sphenoid sinus with small protrusion of left inferior temporal lobe is stable. Other paranasal sinuses remain well pneumatized. Other: Chronic right mastoid  effusion is stable since 2018. Negative visible nasopharynx. Visible internal auditory structures appear normal. Scalp and face soft tissues appear negative. MRI CERVICAL SPINE FINDINGS Alignment: Normal, preserved cervical lordosis. Vertebrae: No marrow edema or evidence of acute osseous abnormality. Visualized  bone marrow signal is within normal limits. Cord: Spinal cord signal is within normal limits at all visualized levels. Posterior Fossa, vertebral arteries, paraspinal tissues: No abnormal signal in the cervical spine ligamentous complex. Cervicomedullary junction is within normal limits. Absent right vertebral artery flow void in keeping with the CTA finding yesterday. Otherwise negative neck soft tissues. Negative visible lung apices. Disc levels: No cervical spinal stenosis despite intermittent mild disc bulging and ligament flavum hypertrophy. Mild bilateral cervical facet hypertrophy. No convincing cervical neural foraminal stenosis. Negative visible upper thoracic levels. IMPRESSION: 1. No new intracranial abnormality. Two of the small lacunar infarcts seen on 05/20/2018 remain visible. 2. Negative for age cervical spine MRI with no spinal stenosis or cervical spinal cord abnormality. 3. Nonacute right vertebral artery poor flow and occlusion as demonstrated by CTA yesterday. 4. 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 5. 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 Electronically Signed   By: Genevie Ann M.D.   On: 05/23/2018 11:39   Mr Cervical Spine Wo Contrast  Result Date: 05/23/2018 CLINICAL DATA:  67 year old female with altered mental status, weakness and dizziness with recent fall. Several small acute infarcts in the right cerebellum and parietal lobe on recent brain MRI. Query new infarct or spinal cord  compression from fall. History of dialysis, sphenoid sinus meningocele EXAM: MRI HEAD WITHOUT CONTRAST MRI CERVICAL SPINE WITHOUT CONTRAST TECHNIQUE: Multiplanar, multiecho pulse sequences of the brain and surrounding structures, and cervical spine, to include the craniocervical junction and cervicothoracic junction, were obtained without intravenous contrast. COMPARISON:  CTA head and neck 05/22/2018. Brain MRI 05/20/2018 and earlier. FINDINGS: MRI HEAD FINDINGS Brain: Stable small foci of restricted diffusion in the right cerebellar tonsil and along the posterior right sylvian fissure on series 5. No new restricted diffusion to suggest acute infarction. Chronic patchy and confluent cerebral white matter T2 and FLAIR hyperintensity. Chronic T2 heterogeneity throughout the deep gray matter nuclei compatible with chronic lacunar infarcts. Patchy and confluent chronic T2 hyperintensity in the pons. Numerous chronic micro hemorrhages scattered primarily in the deep gray matter, brainstem, and cerebellum are stable. Stable cortical encephalomalacia in the anterior left temporal lobe in association with the large left skull base meningoencephalocele (series 15, image 21) which has not significantly changed since 2018. No new signal abnormality. No midline shift, mass effect, evidence of mass lesion, ventriculomegaly, extra-axial collection or acute intracranial hemorrhage. Cervicomedullary junction and pituitary are within normal limits. Vascular: Major intracranial vascular flow voids are stable from the recent CTA findings including evidence of distal right vertebral artery poor flow or occlusion on series 10, image 3. The other major vascular flow voids are preserved and appear stable since 2018. Skull and upper cervical spine: Cervical spine described below. Visualized bone marrow signal is within normal limits. Sinuses/Orbits: Stable and negative orbits. Large CSF containing meningoencephalocele occupying the left  sphenoid sinus with small protrusion of left inferior temporal lobe is stable. Other paranasal sinuses remain well pneumatized. Other: Chronic right mastoid effusion is stable since 2018. Negative visible nasopharynx. Visible internal auditory structures appear normal. Scalp and face soft tissues appear negative. MRI CERVICAL SPINE FINDINGS Alignment: Normal, preserved cervical lordosis. Vertebrae: No marrow edema or evidence of acute osseous abnormality. Visualized bone marrow signal is within normal limits. Cord: Spinal cord signal is within normal limits at all visualized levels. Posterior Fossa, vertebral arteries, paraspinal tissues: No  abnormal signal in the cervical spine ligamentous complex. Cervicomedullary junction is within normal limits. Absent right vertebral artery flow void in keeping with the CTA finding yesterday. Otherwise negative neck soft tissues. Negative visible lung apices. Disc levels: No cervical spinal stenosis despite intermittent mild disc bulging and ligament flavum hypertrophy. Mild bilateral cervical facet hypertrophy. No convincing cervical neural foraminal stenosis. Negative visible upper thoracic levels. IMPRESSION: 1. No new intracranial abnormality. Two of the small lacunar infarcts seen on 05/20/2018 remain visible. 2. Negative for age cervical spine MRI with no spinal stenosis or cervical spinal cord abnormality. 3. Nonacute right vertebral artery poor flow and occlusion as demonstrated by CTA yesterday. 4. 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 5. 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 Electronically Signed   By: Genevie Ann M.D.   On: 05/23/2018 11:39    Scheduled Meds: . allopurinol  100 mg Oral Q Wed  . aspirin EC  81 mg Oral Daily  . atorvastatin  10 mg Oral q1800  . calcitRIOL  1  mcg Oral Q M,W,F-HD  . calcium acetate  667 mg Oral TID WC  . Chlorhexidine Gluconate Cloth  6 each Topical Q0600  . clopidogrel  75 mg Oral Daily  . darbepoetin (ARANESP) injection - DIALYSIS  150 mcg Intravenous Q Fri-HD  . feeding supplement  1 Container Oral TID BM  . gabapentin  100 mg Oral QHS  . heparin injection (subcutaneous)  5,000 Units Subcutaneous Q8H  . levothyroxine  50 mcg Oral QAC breakfast  . lidocaine-prilocaine  1 application Topical Q W,YS,HUO-3729  . megestrol  40 mg Oral Daily  . multivitamin  1 tablet Oral QHS  . pentoxifylline  400 mg Oral TID WC  . polyethylene glycol  17 g Oral BID  . senna-docusate  1 tablet Oral BID    Continuous Infusions: . ferric gluconate (FERRLECIT/NULECIT) IV Stopped (05/22/18 1451)     LOS: 7 days     Alma Friendly, MD [facility]   If 7PM-7AM, please contact night-coverage [email or web address] 05/23/2018, 12:31 PM

## 2018-05-23 NOTE — Progress Notes (Signed)
Occupational Therapy Treatment Patient Details Name: Sonya [name] MRN: [id number] DOB: 01-04-1951 Today's Date: 05/23/2018    History of present illness is a 67 y.o. female with medical history of  anemia of CKD, anxiety, ESRD on hemodialysis Tuesday/Thursday/Saturday, diverticulosis/diverticulitis, history of dysphagia, history of fracture of left femoral neck, GERD, hypertension, hyperparathyroidism due to renal insufficiency, hypothyroidism, intracerebral hemorrhage, meningocele, protein calorie malnutrition, ischemic stroke, history of uterine cancer presenting after mechanical fall going to the bathroom.  s/p 05/17/18 ORIF of condyle fx of R femur   OT comments  Pt progressing towards established OT goals. Pt performing stand pivot transfer to [facility] with Max A +2 and RW. Pt demonstrating increased participation and WBing through UE for support. Pt oriented to place and situation; continue to require increased time and cues during ADLs. Pt requiring Max A +2 for toilet hygiene. Continue to recommend dc to SNF and will continue to follow acutely as admitted.    Follow Up Recommendations  SNF    Equipment Recommendations  Other (comment)(TBD per pt progress)    Recommendations for Other Services      Precautions / Restrictions Precautions Precautions: Fall Restrictions Weight Bearing Restrictions: Yes RLE Weight Bearing: Weight bearing as tolerated       Mobility Bed Mobility Overal bed mobility: Needs Assistance Bed Mobility: Supine to Sit     Supine to sit: HOB elevated;Mod assist;+2 for physical assistance     General bed mobility comments: Mod A to bring RLE and hips towards EOB and then elevate trunk  Transfers Overall transfer level: Needs assistance Equipment used: Rolling walker (2 wheeled) Transfers: Sit to/from [facility] Sit to Stand: Max assist;+2 physical assistance;+2 safety/equipment Stand pivot transfers: Max assist;+2 physical  assistance       General transfer comment: Max A +2 to power up into standing and then maintain balance. Pt with tendency to leave RLE behind and requiring cues to bring RLE back    Balance Overall balance assessment: Needs assistance Sitting-balance support: Feet supported;Bilateral upper extremity supported Sitting balance-Leahy Scale: Poor Sitting balance - Comments: requires min guard/min A for sitting balance EOB and on BSC                                   ADL either performed or assessed with clinical judgement   ADL Overall ADL's : Needs assistance/impaired                         Toilet Transfer: Maximal assistance;+2 for physical assistance;Stand-pivot;BSC;RW Toilet Transfer Details (indicate cue type and reason): Max A +2 to stand and pivot to [facility] (turning to right) and pt abel to weight bear through UE using RW. Toileting- Clothing Manipulation and Hygiene: Maximal assistance;+2 for physical assistance;Sit to/from stand Toileting - Clothing Manipulation Details (indicate cue type and reason): Max A to maintain standing and then second person performing toilet hygiene     Functional mobility during ADLs: +2 for physical assistance;+2 for safety/equipment;Maximal assistance;Cueing for safety;Cueing for sequencing;Rolling walker(stand pivot only) General ADL Comments: Pt performing stand pivot to [facility] and then maintaining standing with Max A for toilet hygiene. Pt with increased participation and following of cues. Pt asking "Did I do good?" Encouraging pt that she worked very hard and did much better today     Manufacturing systems engineer  Cognition Arousal/Alertness: Awake/alert Behavior During Therapy: WFL for tasks assessed/performed Overall Cognitive Status: Impaired/Different from baseline Area of Impairment: Memory;Awareness;Following commands;Problem solving                     Memory: Decreased short-term  memory Following Commands: Follows one step commands with increased time;Follows one step commands inconsistently   Awareness: Emergent Problem Solving: Decreased initiation;Difficulty sequencing;Requires verbal cues;Requires tactile cues General Comments: Pt oriented to place and situation as well as herself and her daughter. Pt anking appropiate questions throughout session. Requiring increased time and cues        Exercises Exercises: General Lower Extremity General Exercises - Lower Extremity Short Arc Quad: AROM;Both;10 reps;Seated   Shoulder Instructions       General Comments Daughter present throughout    Pertinent Vitals/ Pain       Pain Assessment: Faces Faces Pain Scale: Hurts even more Pain Location: R LE with flexion/weight bearing  Pain Descriptors / Indicators: Grimacing;Guarding;Moaning Pain Intervention(s): Monitored during session;Limited activity within patient's tolerance;Repositioned  Home Living                                          Prior Functioning/Environment              Frequency  Min 2X/week        Progress Toward Goals  OT Goals(current goals can now be found in the care plan section)  Progress towards OT goals: Progressing toward goals  Acute Rehab OT Goals Patient Stated Goal: to control pain OT Goal Formulation: With patient/family Time For Goal Achievement: 06/01/18 Potential to Achieve Goals: Good  Plan Discharge plan remains appropriate    Co-evaluation                 AM-PAC PT "6 Clicks" Daily Activity     Outcome Measure   Help from another person eating meals?: A Little Help from another person taking care of personal grooming?: A Little Help from another person toileting, which includes using toliet, bedpan, or urinal?: A Lot Help from another person bathing (including washing, rinsing, drying)?: A Lot Help from another person to put on and taking off regular upper body clothing?: A  Lot Help from another person to put on and taking off regular lower body clothing?: A Lot 6 Click Score: 14    End of Session Equipment Utilized During Treatment: Gait belt;Rolling walker  OT Visit Diagnosis: Other abnormalities of gait and mobility (R26.89);Muscle weakness (generalized) (M62.81);Other symptoms and signs involving cognitive function;Pain Pain - Right/Left: Right Pain - part of body: Hip;Leg   Activity Tolerance Patient tolerated treatment well   Patient Left with call bell/phone within reach;with family/visitor present;in chair   Nurse Communication Mobility status        Time: 4268-3419 OT Time Calculation (min): 24 min  Charges: OT General Charges $OT Visit: 1 Visit OT Treatments $Self Care/Home Management : 23-37 mins  [place], OTR/L Acute Rehab Pager: [phone] Office: [address] 05/23/2018, 4:51 PM

## 2018-05-23 NOTE — Anesthesia Postprocedure Evaluation (Signed)
Anesthesia Post Note  Patient: Sonya [name]  Procedure(s) Performed: OPEN REDUCTION INTERNAL FIXATION (ORIF) DISTAL FEMUR FRACTURE (Right Leg Upper)     Patient location during evaluation: PACU Anesthesia Type: General Level of consciousness: awake Pain management: pain level controlled Vital Signs Assessment: post-procedure vital signs reviewed and stable Respiratory status: spontaneous breathing Cardiovascular status: stable Postop Assessment: no apparent nausea or vomiting Anesthetic complications: no    Last Vitals:  Vitals:   05/22/18 1324 05/22/18 1900  BP: (!) 145/61 139/66  Pulse: (!) 103 96  Resp: 14 16  Temp: 36.8 C 37.1 C  SpO2: 100% 100%    Last Pain:  Vitals:   05/22/18 2300  TempSrc:   PainSc: 0-No pain   Pain Goal: Patients Stated Pain Goal: 1 (05/17/18 0435)               Huston Foley

## 2018-05-23 NOTE — Progress Notes (Signed)
Bilateral lower extremity venous duplex completed. There is no evidence of a DVT or Baker's cyst. Toma Copier, RVS 05/23/2018 10:35 AM

## 2018-05-23 NOTE — Plan of Care (Signed)
  Problem: Clinical Measurements: Goal: Ability to maintain clinical measurements within normal limits will improve Outcome: Progressing Goal: Will remain free from infection Outcome: Progressing   Problem: Education: Goal: Knowledge of secondary prevention will improve Outcome: Progressing   Problem: Nutrition: Goal: Risk of aspiration will decrease Outcome: Progressing   Problem: Ischemic Stroke/TIA Tissue Perfusion: Goal: Complications of ischemic stroke/TIA will be minimized Outcome: Progressing

## 2018-05-23 NOTE — Progress Notes (Addendum)
STROKE TEAM PROGRESS NOTE   INTERVAL HISTORY Daughter at the bedside. Pt states she feels better today than yesterday, though still with some R sided weakness (always a little weak at baseline per pt but not this bad). No new stroke or cord compression. No DVT, no seizure. Plans for SNF at d/c underway.   Vitals:   05/22/18 1324 05/22/18 1900 05/23/18 0504 05/23/18 1328  BP: (!) 145/61 139/66 (!) 160/72 138/62  Pulse: (!) 103 96 94 80  Resp: 14 16 12 16   Temp: 98.2 F (36.8 C) 98.8 F (37.1 C) 98.8 F (37.1 C) 98.7 F (37.1 C)  TempSrc: Oral Oral Oral Oral  SpO2: 100% 100% 100% 100%  Weight:      Height:        CBC:  Recent Labs  Lab 05/21/18 0707 05/22/18 0557  WBC 7.1 7.0  NEUTROABS 5.0 4.4  HGB 9.7* 9.4*  HCT 30.1* 29.5*  MCV 81.6 81.7  PLT 241 751    Basic Metabolic Panel:  Recent Labs  Lab 05/21/18 0707 05/22/18 0557  NA 138 135  K 3.8 4.2  CL 99 97*  CO2 25 26  GLUCOSE 81 91  BUN 48* 65*  CREATININE 6.56* 7.58*  CALCIUM 8.5* 8.5*  PHOS 3.8 3.5   Lipid Panel:     Component Value Date/Time   CHOL 111 05/22/2018 0557   TRIG 172 (H) 05/22/2018 0557   HDL 36 (L) 05/22/2018 0557   CHOLHDL 3.1 05/22/2018 0557   VLDL 34 05/22/2018 0557   LDLCALC 41 05/22/2018 0557   HgbA1c:  Lab Results  Component Value Date   HGBA1C 5.2 05/22/2018   Urine Drug Screen: No results found for: LABOPIA, COCAINSCRNUR, LABBENZ, AMPHETMU, THCU, LABBARB  Alcohol Level     Component Value Date/Time   ETH <10 09/26/2017 1547    IMAGING Ct Angio Head W Or Wo Contrast  Result Date: 05/22/2018 CLINICAL DATA:  Follow up stroke. History of intracranial hemorrhage, hypertension, end-stage renal disease on dialysis, uterine cancer. EXAM: CT ANGIOGRAPHY HEAD AND NECK TECHNIQUE: Multidetector CT imaging of the head and neck was performed using the standard protocol during bolus administration of intravenous contrast. Multiplanar CT image reconstructions and MIPs were obtained to  evaluate the vascular anatomy. Carotid stenosis measurements (when applicable) are obtained utilizing NASCET criteria, using the distal internal carotid diameter as the denominator. CONTRAST:  18mL ISOVUE-370 IOPAMIDOL (ISOVUE-370) INJECTION 76% COMPARISON:  MRI head May 20, 2018 and MRA head September 26, 2017 FINDINGS: CT HEAD FINDINGS BRAIN: No intraparenchymal hemorrhage, mass effect nor midline shift. No advanced parenchymal brain volume loss for age. No hydrocephalus. Confluent supratentorial white matter hypodensities. Old bilateral basal ganglia and thalami lacunar infarcts. Acute large vascular territory infarcts. No abnormal extra-axial fluid collections. Scattered punctate, possibly intraparenchymal calcifications. LEFT temporal lobe encephalomalacia associated with large meningoencephalocele extending to LEFT sphenoid sinus. Basal cisterns are patent. VASCULAR: Severe calcific atherosclerosis of the carotid siphons. SKULL: No skull fracture. No significant scalp soft tissue swelling. SINUSES/ORBITS: Trace paranasal sinus mucosal thickening. Small RIGHT mastoid effusion. Asymmetrically smaller LEFT optic nerve, query blindness. OTHER: Patient is edentulous. CTA NECK FINDINGS: AORTIC ARCH: Normal appearance of the thoracic arch, normal branch pattern. Mild calcific atherosclerosis aortic arch. The origins of the innominate, left Common carotid artery and subclavian artery are widely patent. RIGHT CAROTID SYSTEM: Common carotid artery is patent. Calcific atherosclerosis resulting in less than 50% stenosis by NASCET criteria. Patent internal carotid artery. LEFT CAROTID SYSTEM: Common carotid artery is patent.  Normal appearance of the carotid bifurcation without hemodynamically significant stenosis by NASCET criteria. Normal appearance of the internal carotid artery. VERTEBRAL ARTERIES:Thready irregular RIGHT vertebral artery from the origin distally, occluded at distal V3 segment. Multifocal severe  stenosis and calcific atherosclerosis LEFT vertebral artery. SKELETON: No acute osseous process though bone windows have not been submitted. OTHER NECK: Soft tissues of the neck are nonacute though, not tailored for evaluation. UPPER CHEST: Included lung apices are clear. No superior mediastinal lymphadenopathy. CTA HEAD FINDINGS: ANTERIOR CIRCULATION: Patent cervical internal carotid arteries, petrous, cavernous and supra clinoid internal carotid arteries. Severe stenosis RIGHT petrous segment due to atherosclerosis. Moderate bilateral cavernous stenosis and severe bilateral paraclinoid stenosis. Moderate stenosis patent anterior communicating artery. Patent anterior and middle cerebral arteries, moderate luminal irregularity compatible with atherosclerosis. Severe stenosis LEFT M2 segment, LEFT A2 segment. No large vessel occlusion, contrast extravasation or aneurysm. POSTERIOR CIRCULATION: Occluded RIGHT vertebral artery with minimal retrograde flow distal RIGHT V4 segment. Multifocal severe stenosis LEFT V4 segment. Moderate stenosis diminutive basilar artery. Small RIGHT and possible LEFT posterior communicating artery present. Patent posterior cerebral arteries, moderate luminal irregularity compatible with atherosclerosis. Severe stenosis RIGHT P1 and LEFT P2 segments. No contrast extravasation or aneurysm. VENOUS SINUSES: Major dural venous sinuses are patent though not tailored for evaluation on this angiographic examination. ANATOMIC VARIANTS: None. DELAYED PHASE: No abnormal intracranial enhancement. MIP images reviewed. IMPRESSION: CT HEAD: 1. No acute intracranial process; known acute small infarct not apparent. 2. Severe chronic small vessel ischemic changes and multiple old small infarcts. 3. Large LEFT temporal pseudomeningocele. CTA NECK: 1. Less than 50% stenosis bilateral ICA. 2. Chronic RIGHT vertebral artery dissection, occluded distal RIGHT V3 segment. 3. Severe atherosclerosis versus chronic  dissection LEFT vertebral artery with multifocal severe stenosis. CTA HEAD: 1. No emergent large vessel occlusion. Chronically occluded RIGHT V4 segment. 2. Multifocal severe ICA stenosis. Multifocal cerebral artery severe stenosis. Electronically Signed   By: Elon Alas M.D.   On: 05/22/2018 22:03   Ct Angio Neck W Or Wo Contrast  Result Date: 05/22/2018 CLINICAL DATA:  Follow up stroke. History of intracranial hemorrhage, hypertension, end-stage renal disease on dialysis, uterine cancer. EXAM: CT ANGIOGRAPHY HEAD AND NECK TECHNIQUE: Multidetector CT imaging of the head and neck was performed using the standard protocol during bolus administration of intravenous contrast. Multiplanar CT image reconstructions and MIPs were obtained to evaluate the vascular anatomy. Carotid stenosis measurements (when applicable) are obtained utilizing NASCET criteria, using the distal internal carotid diameter as the denominator. CONTRAST:  10mL ISOVUE-370 IOPAMIDOL (ISOVUE-370) INJECTION 76% COMPARISON:  MRI head May 20, 2018 and MRA head September 26, 2017 FINDINGS: CT HEAD FINDINGS BRAIN: No intraparenchymal hemorrhage, mass effect nor midline shift. No advanced parenchymal brain volume loss for age. No hydrocephalus. Confluent supratentorial white matter hypodensities. Old bilateral basal ganglia and thalami lacunar infarcts. Acute large vascular territory infarcts. No abnormal extra-axial fluid collections. Scattered punctate, possibly intraparenchymal calcifications. LEFT temporal lobe encephalomalacia associated with large meningoencephalocele extending to LEFT sphenoid sinus. Basal cisterns are patent. VASCULAR: Severe calcific atherosclerosis of the carotid siphons. SKULL: No skull fracture. No significant scalp soft tissue swelling. SINUSES/ORBITS: Trace paranasal sinus mucosal thickening. Small RIGHT mastoid effusion. Asymmetrically smaller LEFT optic nerve, query blindness. OTHER: Patient is edentulous.  CTA NECK FINDINGS: AORTIC ARCH: Normal appearance of the thoracic arch, normal branch pattern. Mild calcific atherosclerosis aortic arch. The origins of the innominate, left Common carotid artery and subclavian artery are widely patent. RIGHT CAROTID SYSTEM: Common carotid artery is patent.  Calcific atherosclerosis resulting in less than 50% stenosis by NASCET criteria. Patent internal carotid artery. LEFT CAROTID SYSTEM: Common carotid artery is patent. Normal appearance of the carotid bifurcation without hemodynamically significant stenosis by NASCET criteria. Normal appearance of the internal carotid artery. VERTEBRAL ARTERIES:Thready irregular RIGHT vertebral artery from the origin distally, occluded at distal V3 segment. Multifocal severe stenosis and calcific atherosclerosis LEFT vertebral artery. SKELETON: No acute osseous process though bone windows have not been submitted. OTHER NECK: Soft tissues of the neck are nonacute though, not tailored for evaluation. UPPER CHEST: Included lung apices are clear. No superior mediastinal lymphadenopathy. CTA HEAD FINDINGS: ANTERIOR CIRCULATION: Patent cervical internal carotid arteries, petrous, cavernous and supra clinoid internal carotid arteries. Severe stenosis RIGHT petrous segment due to atherosclerosis. Moderate bilateral cavernous stenosis and severe bilateral paraclinoid stenosis. Moderate stenosis patent anterior communicating artery. Patent anterior and middle cerebral arteries, moderate luminal irregularity compatible with atherosclerosis. Severe stenosis LEFT M2 segment, LEFT A2 segment. No large vessel occlusion, contrast extravasation or aneurysm. POSTERIOR CIRCULATION: Occluded RIGHT vertebral artery with minimal retrograde flow distal RIGHT V4 segment. Multifocal severe stenosis LEFT V4 segment. Moderate stenosis diminutive basilar artery. Small RIGHT and possible LEFT posterior communicating artery present. Patent posterior cerebral arteries, moderate  luminal irregularity compatible with atherosclerosis. Severe stenosis RIGHT P1 and LEFT P2 segments. No contrast extravasation or aneurysm. VENOUS SINUSES: Major dural venous sinuses are patent though not tailored for evaluation on this angiographic examination. ANATOMIC VARIANTS: None. DELAYED PHASE: No abnormal intracranial enhancement. MIP images reviewed. IMPRESSION: CT HEAD: 1. No acute intracranial process; known acute small infarct not apparent. 2. Severe chronic small vessel ischemic changes and multiple old small infarcts. 3. Large LEFT temporal pseudomeningocele. CTA NECK: 1. Less than 50% stenosis bilateral ICA. 2. Chronic RIGHT vertebral artery dissection, occluded distal RIGHT V3 segment. 3. Severe atherosclerosis versus chronic dissection LEFT vertebral artery with multifocal severe stenosis. CTA HEAD: 1. No emergent large vessel occlusion. Chronically occluded RIGHT V4 segment. 2. Multifocal severe ICA stenosis. Multifocal cerebral artery severe stenosis. Electronically Signed   By: Elon Alas M.D.   On: 05/22/2018 22:03   Mr Brain Wo Contrast  Result Date: 05/23/2018 CLINICAL DATA:  67 year old female with altered mental status, weakness and dizziness with recent fall. Several small acute infarcts in the right cerebellum and parietal lobe on recent brain MRI. Query new infarct or spinal cord compression from fall. History of dialysis, sphenoid sinus meningocele EXAM: MRI HEAD WITHOUT CONTRAST MRI CERVICAL SPINE WITHOUT CONTRAST TECHNIQUE: Multiplanar, multiecho pulse sequences of the brain and surrounding structures, and cervical spine, to include the craniocervical junction and cervicothoracic junction, were obtained without intravenous contrast. COMPARISON:  CTA head and neck 05/22/2018. Brain MRI 05/20/2018 and earlier. FINDINGS: MRI HEAD FINDINGS Brain: Stable small foci of restricted diffusion in the right cerebellar tonsil and along the posterior right sylvian fissure on series 5.  No new restricted diffusion to suggest acute infarction. Chronic patchy and confluent cerebral white matter T2 and FLAIR hyperintensity. Chronic T2 heterogeneity throughout the deep gray matter nuclei compatible with chronic lacunar infarcts. Patchy and confluent chronic T2 hyperintensity in the pons. Numerous chronic micro hemorrhages scattered primarily in the deep gray matter, brainstem, and cerebellum are stable. Stable cortical encephalomalacia in the anterior left temporal lobe in association with the large left skull base meningoencephalocele (series 15, image 21) which has not significantly changed since 2018. No new signal abnormality. No midline shift, mass effect, evidence of mass lesion, ventriculomegaly, extra-axial collection or acute intracranial hemorrhage. Cervicomedullary  junction and pituitary are within normal limits. Vascular: Major intracranial vascular flow voids are stable from the recent CTA findings including evidence of distal right vertebral artery poor flow or occlusion on series 10, image 3. The other major vascular flow voids are preserved and appear stable since 2018. Skull and upper cervical spine: Cervical spine described below. Visualized bone marrow signal is within normal limits. Sinuses/Orbits: Stable and negative orbits. Large CSF containing meningoencephalocele occupying the left sphenoid sinus with small protrusion of left inferior temporal lobe is stable. Other paranasal sinuses remain well pneumatized. Other: Chronic right mastoid effusion is stable since 2018. Negative visible nasopharynx. Visible internal auditory structures appear normal. Scalp and face soft tissues appear negative. MRI CERVICAL SPINE FINDINGS Alignment: Normal, preserved cervical lordosis. Vertebrae: No marrow edema or evidence of acute osseous abnormality. Visualized bone marrow signal is within normal limits. Cord: Spinal cord signal is within normal limits at all visualized levels. Posterior Fossa,  vertebral arteries, paraspinal tissues: No abnormal signal in the cervical spine ligamentous complex. Cervicomedullary junction is within normal limits. Absent right vertebral artery flow void in keeping with the CTA finding yesterday. Otherwise negative neck soft tissues. Negative visible lung apices. Disc levels: No cervical spinal stenosis despite intermittent mild disc bulging and ligament flavum hypertrophy. Mild bilateral cervical facet hypertrophy. No convincing cervical neural foraminal stenosis. Negative visible upper thoracic levels. IMPRESSION: 1. No new intracranial abnormality. Two of the small lacunar infarcts seen on 05/20/2018 remain visible. 2. Negative for age cervical spine MRI with no spinal stenosis or cervical spinal cord abnormality. 3. Nonacute right vertebral artery poor flow and occlusion as demonstrated by CTA yesterday. 4. 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 5. 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 Electronically Signed   By: Genevie Ann M.D.   On: 05/23/2018 11:39   Mr Cervical Spine Wo Contrast  Result Date: 05/23/2018 CLINICAL DATA:  67 year old female with altered mental status, weakness and dizziness with recent fall. Several small acute infarcts in the right cerebellum and parietal lobe on recent brain MRI. Query new infarct or spinal cord compression from fall. History of dialysis, sphenoid sinus meningocele EXAM: MRI HEAD WITHOUT CONTRAST MRI CERVICAL SPINE WITHOUT CONTRAST TECHNIQUE: Multiplanar, multiecho pulse sequences of the brain and surrounding structures, and cervical spine, to include the craniocervical junction and cervicothoracic junction, were obtained without intravenous contrast. COMPARISON:  CTA head and neck 05/22/2018. Brain MRI 05/20/2018 and earlier. FINDINGS: MRI HEAD  FINDINGS Brain: Stable small foci of restricted diffusion in the right cerebellar tonsil and along the posterior right sylvian fissure on series 5. No new restricted diffusion to suggest acute infarction. Chronic patchy and confluent cerebral white matter T2 and FLAIR hyperintensity. Chronic T2 heterogeneity throughout the deep gray matter nuclei compatible with chronic lacunar infarcts. Patchy and confluent chronic T2 hyperintensity in the pons. Numerous chronic micro hemorrhages scattered primarily in the deep gray matter, brainstem, and cerebellum are stable. Stable cortical encephalomalacia in the anterior left temporal lobe in association with the large left skull base meningoencephalocele (series 15, image 21) which has not significantly changed since 2018. No new signal abnormality. No midline shift, mass effect, evidence of mass lesion, ventriculomegaly, extra-axial collection or acute intracranial hemorrhage. Cervicomedullary junction and pituitary are within normal limits. Vascular: Major intracranial vascular flow voids are stable from the recent CTA findings including evidence of distal right vertebral  artery poor flow or occlusion on series 10, image 3. The other major vascular flow voids are preserved and appear stable since 2018. Skull and upper cervical spine: Cervical spine described below. Visualized bone marrow signal is within normal limits. Sinuses/Orbits: Stable and negative orbits. Large CSF containing meningoencephalocele occupying the left sphenoid sinus with small protrusion of left inferior temporal lobe is stable. Other paranasal sinuses remain well pneumatized. Other: Chronic right mastoid effusion is stable since 2018. Negative visible nasopharynx. Visible internal auditory structures appear normal. Scalp and face soft tissues appear negative. MRI CERVICAL SPINE FINDINGS Alignment: Normal, preserved cervical lordosis. Vertebrae: No marrow edema or evidence of acute osseous abnormality.  Visualized bone marrow signal is within normal limits. Cord: Spinal cord signal is within normal limits at all visualized levels. Posterior Fossa, vertebral arteries, paraspinal tissues: No abnormal signal in the cervical spine ligamentous complex. Cervicomedullary junction is within normal limits. Absent right vertebral artery flow void in keeping with the CTA finding yesterday. Otherwise negative neck soft tissues. Negative visible lung apices. Disc levels: No cervical spinal stenosis despite intermittent mild disc bulging and ligament flavum hypertrophy. Mild bilateral cervical facet hypertrophy. No convincing cervical neural foraminal stenosis. Negative visible upper thoracic levels. IMPRESSION: 1. No new intracranial abnormality. Two of the small lacunar infarcts seen on 05/20/2018 remain visible. 2. Negative for age cervical spine MRI with no spinal stenosis or cervical spinal cord abnormality. 3. Nonacute right vertebral artery poor flow and occlusion as demonstrated by CTA yesterday. 4. 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 5. 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 Electronically Signed   By: Genevie Ann M.D.   On: 05/23/2018 11:39   EEG This is an abnormal electroencephalogram due to the presence of periodic discharges of triphasic morphology.  These are seen most commonly in encephalopathic states.  Although most often seen with hepatic encephalopathies, they are not isolated to this clinical scenario.  Bilateral lower extremity venous duplex  There is no evidence of a DVT or Baker's cyst.  2D Echocardiogram  - Procedure narrative: Transthoracic echocardiography. Image quality was poor. The study was technically difficult, as a result of poor sound wave transmission. - Left ventricle: The cavity size was  normal. Systolic function was vigorous. The estimated ejection fraction was in the range of 65% to 70%. There was dynamic obstruction at restin the mid cavity, with a peak velocity of 290 cm/sec and a peak gradient of 34 mm Hg. Wall motion was normal; there were no regional wall motion abnormalities. Doppler parameters are consistent with abnormal left ventricular relaxation (grade 1 diastolic dysfunction). - Pulmonary arteries: Systolic pressure was mildly increased. PA peak pressure: 36 mm Hg (S).   PHYSICAL EXAM HEENT-  Normocephalic, no lesions, without obvious abnormality.  Normal external eye and conjunctiva.  Cardiovascular- S1-S2 audible, pulses palpable throughout   Lungs-no rhonchi or wheezing noted, no excessive working breathing.   Extremities- Warm, dry and intact Musculoskeletal-no joint tenderness, deformity or swelling Skin-warm and dry. R lateral thigh dressing CD&I, R leg edema  Neurological Examination Mental Status: Alert, oriented to person/ age/hospital. Thought content appropriate.  able to name, repeat, follow commands. Slight dysarthria.  Cranial Nerves: II:  Visual fields grossly normal,  III,IV, VI: ptosis not present, EOMI, PERRL V,VII: smile asymmetric, slight left facial droop, facial light touch sensation normal bilaterally VIII: hearing normal bilaterally IX,X: uvula  rises symmetrically XI: bilateral shoulder shrug XII: midline tongue extension Motor: Right :  Upper extremity   4+/5              Left:   Upper extremity   5/5             Lower extremity   2/5 (surgery)        Lower extremity   5/5 Mild distal RUE hand drift, arm no longer drifts. RLE ROM limited by recent surgery Tone and bulk:normal tone throughout; no atrophy noted Sensory: light touch intact throughout, bilaterally Plantars: Right: downgoing                                Left: downgoing Cerebellar: mildly clumsy R finger-to-nose, L normal.  unable to perform HTS d/t surgery Gait:  deferred   ASSESSMENT/PLAN Ms. YARITZEL STANGE is a 67 y.o. female with history of ESRD on HD, HTN, ICH in 2016, Uterine cancer presenting to [facility] following fall off BSC w/ fx R femur. S/P ORIF 05/17/2018 with confusion, slurred speech and R>L hand weakness following.  Stroke:  3 punctate right brain infarcts felt to be due to hypotension in setting of known large vessel disease (R VA occlusion, B severe siphon dz). Potentially occurred just prior to fall. Doubt embolic source but cannot rule out. MRI cannot explain patient neurological deficit at this time  CT head No acute stroke. Stable advanced small vessel disease and atrophy. Asymmetric R medial temporal lobe atrophy. Chronic L sphenic sinus opacification, known meningoencephalocele.  MRI  3 punctate infarcts (2-3 R cerebellum, 1 R parietal) numerous chronic microhemmorrhages. Small vessel disease. Atrophy. L sphenic sinus meningoencephalocele.  CTA head no acute infarct. Severe Small vessel disease. Atrophy. Large L pseudomeningocele.   CTA neck < 50% B ICA. Chronic R VA dissection, occluded R V3. Severe atherosclerosis vs dissection L VA w/ mult severe stenosis.  repeat limited MRI with DWI and ADC only - no additional new stroke  MRI C-spine to rule out spinal cord compression after fall no cord compression  2D Echo  EF 65-70%. No source of embolus   EEG no seizures  LE venous Doppler to rule out DVT post lower extremity surgery negative   LDL 41  HgbA1c 5.2  Heparin 5000 units sq tid for VTE prophylaxis  aspirin 81 mg daily and clopidogrel 75 mg daily prior to admission, now on aspirin 81 mg daily and clopidogrel 75 mg daily. Continue DAPT at d/c  Cardiac telemetry ordered this am, not on in room as just back from HD. Agree with need for IP tele to look for possible embolic source  30 day monitor to rule out potential embolic source. Discussed w/ pt and family.  Therapy recommendations:  SNF WBAT.  Disposition:   SNF (rehab at SNF, lived alone PTA with no one able to provide care at d/c)  Medically ok for d/c from stroke standpoint  Follow-up Stroke Clinic at [facility] in 4 weeks. Office will call with appointment date and time. Order placed.  Stroke will sign off. Call for quesitons  Hypertension  BP 130-160s today, much higher than yesterdays BP during HD  Given large vessel disease, recommend keeping SBP >100 during HD  check orthostatic vital signs . Long-term BP goal normotensive  Hyperlipidemia  Home meds:  lipitor 10  Now on lipitor 10  LDL 41, goal < 70  Continue statin at  discharge  Other Stroke Risk Factors  Advanced age  Hx stroke/TIA  09/2017: patient presented to [facility] for altered mental status, confusion and slurred speech.  MRI showed punctate inferior medial right temporal lobe w/ B IVA siphon & VA disease. 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  Carotid Doppler negative EF 65 to 70%.  LDL 93 and A1c 5.5.  Patient discharged with aspirin.   10/14/2014: dizziness and hyper tensive urgency.  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 On 10/17/2014 patient readmitted for confusion after discharge but CT no change, however EEG showed general slowing but left temporal intermittent slowing and sharp waves.  Not treated w/ antiepileptics  PAD - Recent diagnosis - of lower extremities and status post stent placement, patient was put on aspirin and Plavix DAPT treatment.  Other Active Problems  ESRD on HD TTS  R femur fx s/p mechanical fall s/p ORIF 05/17/18  Anemia of CKD/acute blood loss post op requiring blood transfusion 2u 6.9->9.7->9.4  Secondary hyperparathyroidism  Hypothyroidism  Hospital day # [place], MSN, APRN, ANVP-BC, AGPCNP-BC Advanced Practice Stroke Nurse [place] for Schedule & Pager information 05/23/2018 4:23 PM    ATTENDING NOTE: I reviewed above note and agree with the assessment and plan. Pt was seen and examined.   67 year old female with history of ESRD on HD TTS, hypertension, anemia, PAD status post stent admitted for fall status post right femur ORIF and subsequently developed confusion and slurred speech and bilateral upper extremity weakness right more than left.  Patient had history of stroke.  On 10/14/2014 patient admitted for dizziness and hyper tensive urgency.  CT showed subacute left external capsule small ICH which confirmed on MRI.  On 10/17/2014 patient readmitted for confusion after discharge but CT no change, however EEG showed general slowing but left temporal intermittent slowing and sharp waves.  No AEDs given.  In 09/2017 patient admitted for altered mental status, confusion and slurred speech.  CT negative.  MRI showed punctate right inferior mesial temporal lobe infarct.  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  Carotid Doppler negative EF 65 to 70%.  LDL 93 and A1c 5.5.  Patient discharged with aspirin.  Recently, patient was diagnosed with PAD of lower extremities and status post stent placement, patient was put on aspirin and Plavix DAPT treatment.  On this admission, as per granddaughter, patient was on toilet, when she got up she had a fall and had a femur fracture.  Prior to surgery, patient seems neurologically intact as per granddaughter.  However, after surgery, patient was drowsy sleepy, not able to do much.  Yesterday patient seemed more awake alert, however it was found to have weakness of upper extremities, right more than left.  CT no acute abnormality.  MRI showed 2 right cerebellum and 1 right parietal punctate infarcts.  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  2D echo EF 65-70%.  An EEG no seizure but encephalopathy. LDL 41 and A1c 5.2.   Recommend to check orthostatic vital once patient able to bear weight.   On exam, patient sitting in chair, orientated to place, time and people, more awake alert than yesterday. Visual field full, PERRL, EOMI.  No significant facial asymmetry, tongue midline.  Right upper extremity mild pronator drift, with  4/5 proximal, 3/5 wrist extension and finger grip.  Left upper extremity 4+/5.  Left lower extremity 3/5 proximal and 4/5 distal.  Right lower extremity proximal not able to check due to pain status post surgery, but distally only 3+/5.  Sensation and coordination not cooperative, gait not tested.  Bilateral upper extremity DTR 2+, no Hoffmann.  Bilateral lower extremity ankle reflexes diminished, negative Babinski.  Patient stroke on MRI likely due to hypoperfusion on standing up given right VA occlusion and bilateral distal ICA and siphon severe stenosis in MRA in 09/2017 and CTA this time. Repeat limited MRI no new stroke, and also MRI C-spine no spinal cord compression. LE venous Doppler no DVT. Pt right sided weakness likely due to recrudescence from prior left BG ICH in 2016 in the setting of encephalopahty.  Continue aspirin and Plavix DAPT for stroke prevention, continue statin. PT/OT. Avoid low BP, BP goal 130-150 given intracranial stenosis. Recommend 30 day cardiac event monitoring to rule out afib given stroke in multiple locations.  Neurology will sign off. Please call with questions. Pt will follow up with stroke clinic NP at [facility] in about 4 weeks. Thanks for the consult.  Rosalin Hawking, MD PhD Stroke Neurology 05/23/2018 5:18 PM    To contact Stroke Continuity provider, please refer to [email or web address] After hours, contact General Neurology

## 2018-05-23 NOTE — Progress Notes (Signed)
[facility] is able to accommodate dialysis schedule and made bed offer for patient. Patient has SNF placement when medically ready to discharge.   [place], [place]

## 2018-05-23 NOTE — Progress Notes (Signed)
Speech Language Pathology Treatment: Dysphagia  Patient Details Name: Sonya [name] MRN: [id number] DOB: [birth year] Today's Date: 05/23/2018 Time: 1410-1430 SLP Time Calculation (min) (ACUTE ONLY): 20 min  Assessment / Plan / Recommendation Clinical Impression  Pt seen at bedside for follow up after BSE completed 05/22/18. Pt resting in bed, daughter present. Pt was much more alert and conversant today (likely fatigued after dialysis yesterday). Pt was observed with dys 2 solids, puree, and thin liquids. Pt requires cues to clear pocketed solids. No overt s/s aspiration observed or reported on any consistency. Recommend continuing with current (dys 2 - finely chopped) diet with thin liquids, primarily for energy conservation and lack of dentition.    Acute ST goals met. Will sign off at this time. Continued ST at SNF may be beneficial to maximize pt safety for return to independent living.    HPI HPI: 67 year old female admitted 05/16/18 following a fall. ORIF 05/17/18. PMH: anemia, CKD, anxiety, ESRD (HD T/Th/Sa), diverticulitis, dysphagia, left femoral neck fx, GERD, HTN, hyperparathyroidism.renal insufficiency, hypothyroidism, ICH, meningocele, malnutrition, CVA, uterine cancer. MRI = Multiple small foci of acute ischemia: 1 in the right parietal lobe and 2-3 in the right cerebellum. No acute hemorrhage or mass effect. CXR = no active disease.          SLP Plan  Discharge acute SLP treatment due to All goals met       Recommendations  Diet recommendations: Dysphagia 2 (fine chop);Thin liquid Liquids provided via: Cup;Straw Medication Administration: Whole meds with liquid Supervision: Patient able to self feed;Staff to assist with self feeding Compensations: Minimize environmental distractions;Slow rate;Small sips/bites;Lingual sweep for clearance of pocketing Postural Changes and/or Swallow Maneuvers: Seated upright 90 degrees                Oral Care Recommendations: Oral  care BID Follow up Recommendations: 24 hour supervision/assistance SLP Visit Diagnosis: Dysphagia, unspecified (R13.10) Plan: Discharge SLP treatment due to (comment);All goals met       GO               Cortasia Screws B. Murvin [facility], CCC-SLP Speech Language Pathologist [phone]  [place] 05/23/2018, 2:38 PM

## 2018-05-23 NOTE — Progress Notes (Signed)
EEG completed, results pending. 

## 2018-05-23 NOTE — Progress Notes (Signed)
Subjective:  Had more work up for MS today and LE dopplers, no DVT.  Seems that dialysis went OK yest- were able to remove 1900 even with lowish BP- BP fine today - no new c/o's  Objective Vital signs in last 24 hours: Vitals:   05/22/18 1152 05/22/18 1324 05/22/18 1900 05/23/18 0504  BP: 128/89 (!) 145/61 139/66 (!) 160/72  Pulse: (!) 107 (!) 103 96 94  Resp: 18 14 16 12   Temp: 98.4 F (36.9 C) 98.2 F (36.8 C) 98.8 F (37.1 C) 98.8 F (37.1 C)  TempSrc: Oral Oral Oral Oral  SpO2: 100% 100% 100% 100%  Weight: 62.7 kg     Height:       Weight change:   Intake/Output Summary (Last 24 hours) at 05/23/2018 1304 Last data filed at 05/22/2018 1730 Gross per 24 hour  Intake 120 ml  Output -  Net 120 ml    Assessment/ Plan: Pt is a 67 y.o. yo female with ESRD who was admitted on 05/16/2018 with femoral neck fx- s/p ORIF 10/16  Assessment/Plan: 1. Hip fx- s/p ORIF 10/16- PT 2. ESRD - normally TTS [facility] f/b Dr. Hinda Lenis.  Have off schedule here- done Fri and now Monday, plan for next tomorrow, then maybe Sat to get back on schedule 3. Possible dementia- work up here [employer]- not sure what disposition is  3. Anemia- req transfusion this hosp- last hgb in the 9's- ESA ordered as well as iron 4. Secondary hyperparathyroidism- on calcitriol and phoslo - phos at goal - last PTH 743 5. HTN/volume- BP seems to drop much in HD- off of HD 140-160- only on IV prn meds here- on nifed 90 as OP has not been given here  [place]: Basic Metabolic Panel: Recent Labs  Lab 05/19/18 0657 05/19/18 0713 05/21/18 0707 05/22/18 0557  NA 136  --  138 135  K 4.2  --  3.8 4.2  CL 96*  --  99 97*  CO2 25  --  25 26  GLUCOSE 99  --  81 91  BUN 44*  --  48* 65*  CREATININE 7.58*  --  6.56* 7.58*  CALCIUM 8.0* 7.9* 8.5* 8.5*  PHOS 5.7*  --  3.8 3.5   Liver Function Tests: Recent Labs  Lab 05/19/18 0657 05/21/18 0707 05/22/18 0557  ALBUMIN 2.7* 2.5* 2.3*    No results for input(s): LIPASE, AMYLASE in the last 168 hours. No results for input(s): AMMONIA in the last 168 hours. CBC: Recent Labs  Lab 05/18/18 0246 05/19/18 0116 05/20/18 0426 05/21/18 0707 05/22/18 0557  WBC 8.0 8.6 7.8 7.1 7.0  NEUTROABS 6.4 6.7 5.3 5.0 4.4  HGB 8.5* 7.6* 6.9* 9.7* 9.4*  HCT 28.3* 24.5* 22.4* 30.1* 29.5*  MCV 83.5 81.4 80.9 81.6 81.7  PLT 233 228 241 241 252   Cardiac Enzymes: No results for input(s): CKTOTAL, CKMB, CKMBINDEX, TROPONINI in the last 168 hours. CBG: No results for input(s): GLUCAP in the last 168 hours.  Iron Studies: No results for input(s): IRON, TIBC, TRANSFERRIN, FERRITIN in the last 72 hours. Studies/Results: Ct Angio Head W Or Wo Contrast  Result Date: 05/22/2018 CLINICAL DATA:  Follow up stroke. History of intracranial hemorrhage, hypertension, end-stage renal disease on dialysis, uterine cancer. EXAM: CT ANGIOGRAPHY HEAD AND NECK TECHNIQUE: Multidetector CT imaging of the head and neck was performed using the standard protocol during bolus administration of intravenous contrast. Multiplanar CT image reconstructions and MIPs were obtained  to evaluate the vascular anatomy. Carotid stenosis measurements (when applicable) are obtained utilizing NASCET criteria, using the distal internal carotid diameter as the denominator. CONTRAST:  38mL ISOVUE-370 IOPAMIDOL (ISOVUE-370) INJECTION 76% COMPARISON:  MRI head May 20, 2018 and MRA head September 26, 2017 FINDINGS: CT HEAD FINDINGS BRAIN: No intraparenchymal hemorrhage, mass effect nor midline shift. No advanced parenchymal brain volume loss for age. No hydrocephalus. Confluent supratentorial white matter hypodensities. Old bilateral basal ganglia and thalami lacunar infarcts. Acute large vascular territory infarcts. No abnormal extra-axial fluid collections. Scattered punctate, possibly intraparenchymal calcifications. LEFT temporal lobe encephalomalacia associated with large  meningoencephalocele extending to LEFT sphenoid sinus. Basal cisterns are patent. VASCULAR: Severe calcific atherosclerosis of the carotid siphons. SKULL: No skull fracture. No significant scalp soft tissue swelling. SINUSES/ORBITS: Trace paranasal sinus mucosal thickening. Small RIGHT mastoid effusion. Asymmetrically smaller LEFT optic nerve, query blindness. OTHER: Patient is edentulous. CTA NECK FINDINGS: AORTIC ARCH: Normal appearance of the thoracic arch, normal branch pattern. Mild calcific atherosclerosis aortic arch. The origins of the innominate, left Common carotid artery and subclavian artery are widely patent. RIGHT CAROTID SYSTEM: Common carotid artery is patent. Calcific atherosclerosis resulting in less than 50% stenosis by NASCET criteria. Patent internal carotid artery. LEFT CAROTID SYSTEM: Common carotid artery is patent. Normal appearance of the carotid bifurcation without hemodynamically significant stenosis by NASCET criteria. Normal appearance of the internal carotid artery. VERTEBRAL ARTERIES:Thready irregular RIGHT vertebral artery from the origin distally, occluded at distal V3 segment. Multifocal severe stenosis and calcific atherosclerosis LEFT vertebral artery. SKELETON: No acute osseous process though bone windows have not been submitted. OTHER NECK: Soft tissues of the neck are nonacute though, not tailored for evaluation. UPPER CHEST: Included lung apices are clear. No superior mediastinal lymphadenopathy. CTA HEAD FINDINGS: ANTERIOR CIRCULATION: Patent cervical internal carotid arteries, petrous, cavernous and supra clinoid internal carotid arteries. Severe stenosis RIGHT petrous segment due to atherosclerosis. Moderate bilateral cavernous stenosis and severe bilateral paraclinoid stenosis. Moderate stenosis patent anterior communicating artery. Patent anterior and middle cerebral arteries, moderate luminal irregularity compatible with atherosclerosis. Severe stenosis LEFT M2 segment,  LEFT A2 segment. No large vessel occlusion, contrast extravasation or aneurysm. POSTERIOR CIRCULATION: Occluded RIGHT vertebral artery with minimal retrograde flow distal RIGHT V4 segment. Multifocal severe stenosis LEFT V4 segment. Moderate stenosis diminutive basilar artery. Small RIGHT and possible LEFT posterior communicating artery present. Patent posterior cerebral arteries, moderate luminal irregularity compatible with atherosclerosis. Severe stenosis RIGHT P1 and LEFT P2 segments. No contrast extravasation or aneurysm. VENOUS SINUSES: Major dural venous sinuses are patent though not tailored for evaluation on this angiographic examination. ANATOMIC VARIANTS: None. DELAYED PHASE: No abnormal intracranial enhancement. MIP images reviewed. IMPRESSION: CT HEAD: 1. No acute intracranial process; known acute small infarct not apparent. 2. Severe chronic small vessel ischemic changes and multiple old small infarcts. 3. Large LEFT temporal pseudomeningocele. CTA NECK: 1. Less than 50% stenosis bilateral ICA. 2. Chronic RIGHT vertebral artery dissection, occluded distal RIGHT V3 segment. 3. Severe atherosclerosis versus chronic dissection LEFT vertebral artery with multifocal severe stenosis. CTA HEAD: 1. No emergent large vessel occlusion. Chronically occluded RIGHT V4 segment. 2. Multifocal severe ICA stenosis. Multifocal cerebral artery severe stenosis. Electronically Signed   By: Elon Alas M.D.   On: 05/22/2018 22:03   Ct Angio Neck W Or Wo Contrast  Result Date: 05/22/2018 CLINICAL DATA:  Follow up stroke. History of intracranial hemorrhage, hypertension, end-stage renal disease on dialysis, uterine cancer. EXAM: CT ANGIOGRAPHY HEAD AND NECK TECHNIQUE: Multidetector CT imaging of the head  and neck was performed using the standard protocol during bolus administration of intravenous contrast. Multiplanar CT image reconstructions and MIPs were obtained to evaluate the vascular anatomy. Carotid stenosis  measurements (when applicable) are obtained utilizing NASCET criteria, using the distal internal carotid diameter as the denominator. CONTRAST:  50mL ISOVUE-370 IOPAMIDOL (ISOVUE-370) INJECTION 76% COMPARISON:  MRI head May 20, 2018 and MRA head September 26, 2017 FINDINGS: CT HEAD FINDINGS BRAIN: No intraparenchymal hemorrhage, mass effect nor midline shift. No advanced parenchymal brain volume loss for age. No hydrocephalus. Confluent supratentorial white matter hypodensities. Old bilateral basal ganglia and thalami lacunar infarcts. Acute large vascular territory infarcts. No abnormal extra-axial fluid collections. Scattered punctate, possibly intraparenchymal calcifications. LEFT temporal lobe encephalomalacia associated with large meningoencephalocele extending to LEFT sphenoid sinus. Basal cisterns are patent. VASCULAR: Severe calcific atherosclerosis of the carotid siphons. SKULL: No skull fracture. No significant scalp soft tissue swelling. SINUSES/ORBITS: Trace paranasal sinus mucosal thickening. Small RIGHT mastoid effusion. Asymmetrically smaller LEFT optic nerve, query blindness. OTHER: Patient is edentulous. CTA NECK FINDINGS: AORTIC ARCH: Normal appearance of the thoracic arch, normal branch pattern. Mild calcific atherosclerosis aortic arch. The origins of the innominate, left Common carotid artery and subclavian artery are widely patent. RIGHT CAROTID SYSTEM: Common carotid artery is patent. Calcific atherosclerosis resulting in less than 50% stenosis by NASCET criteria. Patent internal carotid artery. LEFT CAROTID SYSTEM: Common carotid artery is patent. Normal appearance of the carotid bifurcation without hemodynamically significant stenosis by NASCET criteria. Normal appearance of the internal carotid artery. VERTEBRAL ARTERIES:Thready irregular RIGHT vertebral artery from the origin distally, occluded at distal V3 segment. Multifocal severe stenosis and calcific atherosclerosis LEFT vertebral  artery. SKELETON: No acute osseous process though bone windows have not been submitted. OTHER NECK: Soft tissues of the neck are nonacute though, not tailored for evaluation. UPPER CHEST: Included lung apices are clear. No superior mediastinal lymphadenopathy. CTA HEAD FINDINGS: ANTERIOR CIRCULATION: Patent cervical internal carotid arteries, petrous, cavernous and supra clinoid internal carotid arteries. Severe stenosis RIGHT petrous segment due to atherosclerosis. Moderate bilateral cavernous stenosis and severe bilateral paraclinoid stenosis. Moderate stenosis patent anterior communicating artery. Patent anterior and middle cerebral arteries, moderate luminal irregularity compatible with atherosclerosis. Severe stenosis LEFT M2 segment, LEFT A2 segment. No large vessel occlusion, contrast extravasation or aneurysm. POSTERIOR CIRCULATION: Occluded RIGHT vertebral artery with minimal retrograde flow distal RIGHT V4 segment. Multifocal severe stenosis LEFT V4 segment. Moderate stenosis diminutive basilar artery. Small RIGHT and possible LEFT posterior communicating artery present. Patent posterior cerebral arteries, moderate luminal irregularity compatible with atherosclerosis. Severe stenosis RIGHT P1 and LEFT P2 segments. No contrast extravasation or aneurysm. VENOUS SINUSES: Major dural venous sinuses are patent though not tailored for evaluation on this angiographic examination. ANATOMIC VARIANTS: None. DELAYED PHASE: No abnormal intracranial enhancement. MIP images reviewed. IMPRESSION: CT HEAD: 1. No acute intracranial process; known acute small infarct not apparent. 2. Severe chronic small vessel ischemic changes and multiple old small infarcts. 3. Large LEFT temporal pseudomeningocele. CTA NECK: 1. Less than 50% stenosis bilateral ICA. 2. Chronic RIGHT vertebral artery dissection, occluded distal RIGHT V3 segment. 3. Severe atherosclerosis versus chronic dissection LEFT vertebral artery with multifocal  severe stenosis. CTA HEAD: 1. No emergent large vessel occlusion. Chronically occluded RIGHT V4 segment. 2. Multifocal severe ICA stenosis. Multifocal cerebral artery severe stenosis. Electronically Signed   By: Elon Alas M.D.   On: 05/22/2018 22:03   Mr Brain Wo Contrast  Result Date: 05/23/2018 CLINICAL DATA:  67 year old female with altered mental status, weakness and  dizziness with recent fall. Several small acute infarcts in the right cerebellum and parietal lobe on recent brain MRI. Query new infarct or spinal cord compression from fall. History of dialysis, sphenoid sinus meningocele EXAM: MRI HEAD WITHOUT CONTRAST MRI CERVICAL SPINE WITHOUT CONTRAST TECHNIQUE: Multiplanar, multiecho pulse sequences of the brain and surrounding structures, and cervical spine, to include the craniocervical junction and cervicothoracic junction, were obtained without intravenous contrast. COMPARISON:  CTA head and neck 05/22/2018. Brain MRI 05/20/2018 and earlier. FINDINGS: MRI HEAD FINDINGS Brain: Stable small foci of restricted diffusion in the right cerebellar tonsil and along the posterior right sylvian fissure on series 5. No new restricted diffusion to suggest acute infarction. Chronic patchy and confluent cerebral white matter T2 and FLAIR hyperintensity. Chronic T2 heterogeneity throughout the deep gray matter nuclei compatible with chronic lacunar infarcts. Patchy and confluent chronic T2 hyperintensity in the pons. Numerous chronic micro hemorrhages scattered primarily in the deep gray matter, brainstem, and cerebellum are stable. Stable cortical encephalomalacia in the anterior left temporal lobe in association with the large left skull base meningoencephalocele (series 15, image 21) which has not significantly changed since 2018. No new signal abnormality. No midline shift, mass effect, evidence of mass lesion, ventriculomegaly, extra-axial collection or acute intracranial hemorrhage. Cervicomedullary  junction and pituitary are within normal limits. Vascular: Major intracranial vascular flow voids are stable from the recent CTA findings including evidence of distal right vertebral artery poor flow or occlusion on series 10, image 3. The other major vascular flow voids are preserved and appear stable since 2018. Skull and upper cervical spine: Cervical spine described below. Visualized bone marrow signal is within normal limits. Sinuses/Orbits: Stable and negative orbits. Large CSF containing meningoencephalocele occupying the left sphenoid sinus with small protrusion of left inferior temporal lobe is stable. Other paranasal sinuses remain well pneumatized. Other: Chronic right mastoid effusion is stable since 2018. Negative visible nasopharynx. Visible internal auditory structures appear normal. Scalp and face soft tissues appear negative. MRI CERVICAL SPINE FINDINGS Alignment: Normal, preserved cervical lordosis. Vertebrae: No marrow edema or evidence of acute osseous abnormality. Visualized bone marrow signal is within normal limits. Cord: Spinal cord signal is within normal limits at all visualized levels. Posterior Fossa, vertebral arteries, paraspinal tissues: No abnormal signal in the cervical spine ligamentous complex. Cervicomedullary junction is within normal limits. Absent right vertebral artery flow void in keeping with the CTA finding yesterday. Otherwise negative neck soft tissues. Negative visible lung apices. Disc levels: No cervical spinal stenosis despite intermittent mild disc bulging and ligament flavum hypertrophy. Mild bilateral cervical facet hypertrophy. No convincing cervical neural foraminal stenosis. Negative visible upper thoracic levels. IMPRESSION: 1. No new intracranial abnormality. Two of the small lacunar infarcts seen on 05/20/2018 remain visible. 2. Negative for age cervical spine MRI with no spinal stenosis or cervical spinal cord abnormality. 3. Nonacute right vertebral artery  poor flow and occlusion as demonstrated by CTA yesterday. 4. 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 5. 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 Electronically Signed   By: Genevie Ann M.D.   On: 05/23/2018 11:39   Mr Cervical Spine Wo Contrast  Result Date: 05/23/2018 CLINICAL DATA:  67 year old female with altered mental status, weakness and dizziness with recent fall. Several small acute infarcts in the right cerebellum and parietal lobe on recent brain MRI. Query new infarct or spinal cord compression  from fall. History of dialysis, sphenoid sinus meningocele EXAM: MRI HEAD WITHOUT CONTRAST MRI CERVICAL SPINE WITHOUT CONTRAST TECHNIQUE: Multiplanar, multiecho pulse sequences of the brain and surrounding structures, and cervical spine, to include the craniocervical junction and cervicothoracic junction, were obtained without intravenous contrast. COMPARISON:  CTA head and neck 05/22/2018. Brain MRI 05/20/2018 and earlier. FINDINGS: MRI HEAD FINDINGS Brain: Stable small foci of restricted diffusion in the right cerebellar tonsil and along the posterior right sylvian fissure on series 5. No new restricted diffusion to suggest acute infarction. Chronic patchy and confluent cerebral white matter T2 and FLAIR hyperintensity. Chronic T2 heterogeneity throughout the deep gray matter nuclei compatible with chronic lacunar infarcts. Patchy and confluent chronic T2 hyperintensity in the pons. Numerous chronic micro hemorrhages scattered primarily in the deep gray matter, brainstem, and cerebellum are stable. Stable cortical encephalomalacia in the anterior left temporal lobe in association with the large left skull base meningoencephalocele (series 15, image 21) which has not significantly changed since 2018. No new signal abnormality.  No midline shift, mass effect, evidence of mass lesion, ventriculomegaly, extra-axial collection or acute intracranial hemorrhage. Cervicomedullary junction and pituitary are within normal limits. Vascular: Major intracranial vascular flow voids are stable from the recent CTA findings including evidence of distal right vertebral artery poor flow or occlusion on series 10, image 3. The other major vascular flow voids are preserved and appear stable since 2018. Skull and upper cervical spine: Cervical spine described below. Visualized bone marrow signal is within normal limits. Sinuses/Orbits: Stable and negative orbits. Large CSF containing meningoencephalocele occupying the left sphenoid sinus with small protrusion of left inferior temporal lobe is stable. Other paranasal sinuses remain well pneumatized. Other: Chronic right mastoid effusion is stable since 2018. Negative visible nasopharynx. Visible internal auditory structures appear normal. Scalp and face soft tissues appear negative. MRI CERVICAL SPINE FINDINGS Alignment: Normal, preserved cervical lordosis. Vertebrae: No marrow edema or evidence of acute osseous abnormality. Visualized bone marrow signal is within normal limits. Cord: Spinal cord signal is within normal limits at all visualized levels. Posterior Fossa, vertebral arteries, paraspinal tissues: No abnormal signal in the cervical spine ligamentous complex. Cervicomedullary junction is within normal limits. Absent right vertebral artery flow void in keeping with the CTA finding yesterday. Otherwise negative neck soft tissues. Negative visible lung apices. Disc levels: No cervical spinal stenosis despite intermittent mild disc bulging and ligament flavum hypertrophy. Mild bilateral cervical facet hypertrophy. No convincing cervical neural foraminal stenosis. Negative visible upper thoracic levels. IMPRESSION: 1. No new intracranial abnormality. Two of the small lacunar infarcts seen on 05/20/2018  remain visible. 2. Negative for age cervical spine MRI with no spinal stenosis or cervical spinal cord abnormality. 3. Nonacute right vertebral artery poor flow and occlusion as demonstrated by CTA yesterday. 4. 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 5. 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 Electronically Signed   By: Genevie Ann M.D.   On: 05/23/2018 11:39   Medications: Infusions: . ferric gluconate (FERRLECIT/NULECIT) IV Stopped (05/22/18 1451)    Scheduled Medications: . allopurinol  100 mg Oral Q Wed  . aspirin EC  81 mg Oral Daily  . atorvastatin  10 mg Oral q1800  . calcitRIOL  1 mcg Oral Q M,W,F-HD  . calcium acetate  667 mg Oral TID WC  . Chlorhexidine Gluconate Cloth  6 each Topical Q0600  . clopidogrel  75 mg Oral Daily  . darbepoetin (ARANESP) injection - DIALYSIS  150 mcg Intravenous Q Fri-HD  . feeding supplement  1 Container Oral TID BM  . gabapentin  100 mg Oral QHS  . heparin injection (subcutaneous)  5,000 Units Subcutaneous Q8H  . levothyroxine  50 mcg Oral QAC breakfast  . lidocaine-prilocaine  1 application Topical Q E,HO,[id number]  . megestrol  40 mg Oral Daily  . multivitamin  1 tablet Oral QHS  . pentoxifylline  400 mg Oral TID WC  . polyethylene glycol  17 g Oral BID  . senna-docusate  1 tablet Oral BID    have reviewed scheduled and prn medications.  Physical Exam: General: pleasant- no c/o's Heart: RRR Lungs: mostly clear Abdomen: soft, non tender Extremities: no apprec edema Dialysis Access: right upper arm AVG    05/23/2018,1:04 PM  LOS: 7 days

## 2018-05-23 NOTE — Progress Notes (Signed)
  Echocardiogram 2D Echocardiogram has been performed.  Sonya [name] 05/23/2018, 11:05 AM

## 2018-05-23 NOTE — Progress Notes (Signed)
CSW attempted to contact family and friend listed as contacts for patient.   Daughter's number continues to go straight to voicemail, friend's number's voicemail is full, secondary number is not to her.   Will continue to attempt connect with goal of reviewing SNF options and choosing SNF facility. Advising nurse if family arrives to please contact social worker immediately. Thank you.   [place], [place]

## 2018-05-24 DIAGNOSIS — E785 Hyperlipidemia, unspecified: Secondary | ICD-10-CM | POA: Diagnosis not present

## 2018-05-24 DIAGNOSIS — M109 Gout, unspecified: Secondary | ICD-10-CM | POA: Diagnosis present

## 2018-05-24 DIAGNOSIS — I69391 Dysphagia following cerebral infarction: Secondary | ICD-10-CM | POA: Diagnosis not present

## 2018-05-24 DIAGNOSIS — K219 Gastro-esophageal reflux disease without esophagitis: Secondary | ICD-10-CM | POA: Diagnosis present

## 2018-05-24 DIAGNOSIS — S72451D Displaced supracondylar fracture without intracondylar extension of lower end of right femur, subsequent encounter for closed fracture with routine healing: Secondary | ICD-10-CM | POA: Diagnosis not present

## 2018-05-24 DIAGNOSIS — R41841 Cognitive communication deficit: Secondary | ICD-10-CM | POA: Diagnosis present

## 2018-05-24 DIAGNOSIS — S72351D Displaced comminuted fracture of shaft of right femur, subsequent encounter for closed fracture with routine healing: Secondary | ICD-10-CM | POA: Diagnosis not present

## 2018-05-24 DIAGNOSIS — D631 Anemia in chronic kidney disease: Secondary | ICD-10-CM | POA: Diagnosis present

## 2018-05-24 DIAGNOSIS — E782 Mixed hyperlipidemia: Secondary | ICD-10-CM | POA: Diagnosis not present

## 2018-05-24 DIAGNOSIS — S72351A Displaced comminuted fracture of shaft of right femur, initial encounter for closed fracture: Secondary | ICD-10-CM | POA: Diagnosis not present

## 2018-05-24 DIAGNOSIS — N2581 Secondary hyperparathyroidism of renal origin: Secondary | ICD-10-CM | POA: Diagnosis present

## 2018-05-24 DIAGNOSIS — I63019 Cerebral infarction due to thrombosis of unspecified vertebral artery: Secondary | ICD-10-CM | POA: Diagnosis present

## 2018-05-24 DIAGNOSIS — M2550 Pain in unspecified joint: Secondary | ICD-10-CM | POA: Diagnosis not present

## 2018-05-24 DIAGNOSIS — I63511 Cerebral infarction due to unspecified occlusion or stenosis of right middle cerebral artery: Secondary | ICD-10-CM | POA: Diagnosis not present

## 2018-05-24 DIAGNOSIS — M6281 Muscle weakness (generalized): Secondary | ICD-10-CM | POA: Diagnosis present

## 2018-05-24 DIAGNOSIS — I6340 Cerebral infarction due to embolism of unspecified cerebral artery: Secondary | ICD-10-CM | POA: Diagnosis not present

## 2018-05-24 DIAGNOSIS — I1311 Hypertensive heart and chronic kidney disease without heart failure, with stage 5 chronic kidney disease, or end stage renal disease: Secondary | ICD-10-CM | POA: Diagnosis present

## 2018-05-24 DIAGNOSIS — I69328 Other speech and language deficits following cerebral infarction: Secondary | ICD-10-CM | POA: Diagnosis not present

## 2018-05-24 DIAGNOSIS — E039 Hypothyroidism, unspecified: Secondary | ICD-10-CM | POA: Diagnosis present

## 2018-05-24 DIAGNOSIS — N186 End stage renal disease: Secondary | ICD-10-CM | POA: Diagnosis not present

## 2018-05-24 DIAGNOSIS — Z7401 Bed confinement status: Secondary | ICD-10-CM | POA: Diagnosis not present

## 2018-05-24 DIAGNOSIS — R262 Difficulty in walking, not elsewhere classified: Secondary | ICD-10-CM | POA: Diagnosis present

## 2018-05-24 DIAGNOSIS — R1310 Dysphagia, unspecified: Secondary | ICD-10-CM | POA: Diagnosis present

## 2018-05-24 DIAGNOSIS — R52 Pain, unspecified: Secondary | ICD-10-CM | POA: Diagnosis not present

## 2018-05-24 DIAGNOSIS — Z992 Dependence on renal dialysis: Secondary | ICD-10-CM | POA: Diagnosis not present

## 2018-05-24 DIAGNOSIS — I10 Essential (primary) hypertension: Secondary | ICD-10-CM | POA: Diagnosis present

## 2018-05-24 DIAGNOSIS — D509 Iron deficiency anemia, unspecified: Secondary | ICD-10-CM | POA: Diagnosis not present

## 2018-05-24 DIAGNOSIS — Z9181 History of falling: Secondary | ICD-10-CM | POA: Diagnosis not present

## 2018-05-24 DIAGNOSIS — I679 Cerebrovascular disease, unspecified: Secondary | ICD-10-CM | POA: Diagnosis not present

## 2018-05-24 LAB — CBC WITH DIFFERENTIAL/PLATELET
Abs Immature Granulocytes: 0.05 10*3/uL (ref 0.00–0.07)
BASOS ABS: 0 10*3/uL (ref 0.0–0.1)
Basophils Relative: 0 %
Eosinophils Absolute: 0.3 10*3/uL (ref 0.0–0.5)
Eosinophils Relative: 3 %
HEMATOCRIT: 28.9 % — AB (ref 36.0–46.0)
HEMOGLOBIN: 9.1 g/dL — AB (ref 12.0–15.0)
IMMATURE GRANULOCYTES: 1 %
LYMPHS ABS: 1.7 10*3/uL (ref 0.7–4.0)
LYMPHS PCT: 23 %
MCH: 26.2 pg (ref 26.0–34.0)
MCHC: 31.5 g/dL (ref 30.0–36.0)
MCV: 83.3 fL (ref 80.0–100.0)
Monocytes Absolute: 0.8 10*3/uL (ref 0.1–1.0)
Monocytes Relative: 11 %
NEUTROS PCT: 62 %
NRBC: 0.5 % — AB (ref 0.0–0.2)
Neutro Abs: 4.5 10*3/uL (ref 1.7–7.7)
Platelets: 291 10*3/uL (ref 150–400)
RBC: 3.47 MIL/uL — ABNORMAL LOW (ref 3.87–5.11)
RDW: 16.4 % — AB (ref 11.5–15.5)
WBC: 7.4 10*3/uL (ref 4.0–10.5)

## 2018-05-24 LAB — RENAL FUNCTION PANEL
ANION GAP: 12 (ref 5–15)
Albumin: 2.4 g/dL — ABNORMAL LOW (ref 3.5–5.0)
BUN: 38 mg/dL — ABNORMAL HIGH (ref 8–23)
CHLORIDE: 96 mmol/L — AB (ref 98–111)
CO2: 24 mmol/L (ref 22–32)
Calcium: 8.7 mg/dL — ABNORMAL LOW (ref 8.9–10.3)
Creatinine, Ser: 5.5 mg/dL — ABNORMAL HIGH (ref 0.44–1.00)
GFR calc non Af Amer: 7 mL/min — ABNORMAL LOW (ref >60)
GFR, EST AFRICAN AMERICAN: 8 mL/min — AB (ref >60)
Glucose, Bld: 83 mg/dL (ref 70–99)
Phosphorus: 2.4 mg/dL — ABNORMAL LOW (ref 2.5–4.6)
Potassium: 3.4 mmol/L — ABNORMAL LOW (ref 3.5–5.1)
Sodium: 132 mmol/L — ABNORMAL LOW (ref 135–145)

## 2018-05-24 MED ORDER — RENA-VITE PO TABS: 1.0000 | ORAL_TABLET | Freq: Every day | ORAL | 0 refills | Status: DC

## 2018-05-24 MED ORDER — ALPRAZOLAM 0.5 MG PO TABS: 0.5000 mg | ORAL_TABLET | Freq: Two times a day (BID) | ORAL | 0 refills | Status: DC | PRN

## 2018-05-24 MED ORDER — SENNOSIDES-DOCUSATE SODIUM 8.6-50 MG PO TABS: 1.0000 | ORAL_TABLET | Freq: Two times a day (BID) | ORAL | 0 refills | Status: DC

## 2018-05-24 MED ORDER — HYDROCODONE-ACETAMINOPHEN 5-325 MG PO TABS: 1.0000 | ORAL_TABLET | Freq: Four times a day (QID) | ORAL | 0 refills | Status: DC | PRN

## 2018-05-24 MED ORDER — POLYETHYLENE GLYCOL 3350 17 G PO PACK: 17.0000 g | PACK | Freq: Two times a day (BID) | ORAL | 0 refills | Status: DC

## 2018-05-24 MED ORDER — MEGESTROL ACETATE 40 MG PO TABS: 40.0000 mg | ORAL_TABLET | Freq: Every day | ORAL | 0 refills | Status: DC

## 2018-05-24 MED ORDER — CALCIUM ACETATE (PHOS BINDER) 667 MG PO CAPS: 667.0000 mg | ORAL_CAPSULE | Freq: Three times a day (TID) | ORAL | 0 refills | Status: AC

## 2018-05-24 NOTE — Clinical Social Work Placement (Signed)
CLINICAL SOCIAL WORK PLACEMENT  NOTE  Date:  05/24/2018  Patient Details  Name: Sonya [name] MRN: [id number] Date of Birth: 1951-03-11  Clinical Social Work is seeking post-discharge placement for this patient at the Skilled  Nursing Facility level of care (*CSW will initial, date and re-position this form in  chart as items are completed):  Yes   Patient/family provided with [facility] Clinical Social Work Department's list of facilities offering this level of care within the geographic area requested by the patient (or if unable, by the patient's family).  Yes   Patient/family informed of their freedom to choose among providers that offer the needed level of care, that participate in Medicare, Medicaid or managed care program needed by the patient, have an available bed and are willing to accept the patient.      Patient/family informed of [facility]'s ownership interest in [facility] and [facility], as well as of the fact that they are under no obligation to receive care at these facilities.  PASRR submitted to EDS on       PASRR number received on 05/22/18     Existing PASRR number confirmed on       FL2 transmitted to all facilities in geographic area requested by pt/family on 05/22/18     FL2 transmitted to all facilities within larger geographic area on       Patient informed that his/her managed care company has contracts with or will negotiate with certain facilities, including the following:        Yes   Patient/family informed of bed offers received.  Patient chooses bed at [facility]     Physician recommends and patient chooses bed at      Patient to be transferred to [facility] [place] on 05/24/18.  Patient to be transferred to facility by [facility]     Patient family notified on 05/24/18 of transfer.  Name of family member notified:  Lucille Passy (daughter) at [phone]     PHYSICIAN       Additional Comment:     _______________________________________________ Gildardo Griffes, LCSW 05/24/2018, 2:42 PM

## 2018-05-24 NOTE — Progress Notes (Signed)
Physical Therapy Treatment Patient Details Name: Sonya [name] MRN: [id number] DOB: February 26, 1951 Today's Date: 05/24/2018    History of Present Illness is a 67 y.o. female with medical history of  anemia of CKD, anxiety, ESRD on hemodialysis Tuesday/Thursday/Saturday, diverticulosis/diverticulitis, history of dysphagia, history of fracture of left femoral neck, GERD, hypertension, hyperparathyroidism due to renal insufficiency, hypothyroidism, intracerebral hemorrhage, meningocele, protein calorie malnutrition, ischemic stroke, history of uterine cancer presenting after mechanical fall going to the bathroom.  s/p 05/17/18 ORIF of condyle fx of R femur    PT Comments    Patient seen for mobility progression. Pt up in chair upon arrival and more alert than previous sessions. Pt is able to stand with max A +2 and RW. Pt's mobility continues to be limited by pain and generalized weakness. Continue to progress as tolerated with anticipated d/c to SNF for further skilled PT services.     Follow Up Recommendations  SNF     Equipment Recommendations  None recommended by PT    Recommendations for Other Services OT consult     Precautions / Restrictions Precautions Precautions: Fall Restrictions Weight Bearing Restrictions: Yes RLE Weight Bearing: Weight bearing as tolerated    Mobility  Bed Mobility               General bed mobility comments: pt OOB in chair upon arrival   Transfers Overall transfer level: Needs assistance Equipment used: Rolling walker (2 wheeled) Transfers: Sit to/from Stand Sit to Stand: Max assist;+2 physical assistance         General transfer comment: step by step cues for hand placement/positioning and +2 assist to power up into standing and maintain standing with facilitation for hip extension; pt able to stand nearly upright first trial and partial stand second trial   Ambulation/Gait                 Stairs             Wheelchair  Mobility    Modified Rankin (Stroke Patients Only)       Balance Overall balance assessment: Needs assistance Sitting-balance support: Feet supported;Bilateral upper extremity supported Sitting balance-Leahy Scale: Fair       Standing balance-Leahy Scale: Zero                              Cognition Arousal/Alertness: Awake/alert Behavior During Therapy: WFL for tasks assessed/performed Overall Cognitive Status: Impaired/Different from baseline Area of Impairment: Memory;Awareness;Following commands;Problem solving                     Memory: Decreased short-term memory Following Commands: Follows one step commands with increased time;Follows one step commands inconsistently   Awareness: Emergent Problem Solving: Decreased initiation;Difficulty sequencing;Requires verbal cues;Requires tactile cues;Slow processing        Exercises      General Comments General comments (skin integrity, edema, etc.): daughter present      Pertinent Vitals/Pain Pain Assessment: Faces Faces Pain Scale: Hurts whole lot Pain Location: R LE   Pain Descriptors / Indicators: Grimacing;Guarding;Moaning Pain Intervention(s): Limited activity within patient's tolerance;Monitored during session;Repositioned;RN gave pain meds during session    Home Living                      Prior Function            PT Goals (current goals can now be found in the care plan section)  Acute Rehab PT Goals Patient Stated Goal: to control pain Progress towards PT goals: Progressing toward goals    Frequency    Min 3X/week      PT Plan Current plan remains appropriate    Co-evaluation              AM-PAC PT "6 Clicks" Daily Activity  Outcome Measure  Difficulty turning over in bed (including adjusting bedclothes, sheets and blankets)?: Unable Difficulty moving from lying on back to sitting on the side of the bed? : Unable Difficulty sitting down on and standing  up from a chair with arms (e.g., wheelchair, bedside commode, etc,.)?: Unable Help needed moving to and from a bed to chair (including a wheelchair)?: A Lot Help needed walking in hospital room?: Total Help needed climbing 3-5 steps with a railing? : Total 6 Click Score: 7    End of Session Equipment Utilized During Treatment: Gait belt Activity Tolerance: Patient limited by pain;Patient limited by fatigue Patient left: with call bell/phone within reach;with family/visitor present;in chair Nurse Communication: Mobility status PT Visit Diagnosis: Unsteadiness on feet (R26.81);Other abnormalities of gait and mobility (R26.89);Muscle weakness (generalized) (M62.81);Difficulty in walking, not elsewhere classified (R26.2);Pain Pain - Right/Left: Right Pain - part of body: Leg     Time: 1209-1232 PT Time Calculation (min) (ACUTE ONLY): 23 min  Charges:  $Therapeutic Activity: 23-37 mins                     Earney Navy, PTA Acute Rehabilitation Services Pager: [phone] Office: [phone]     Darliss Cheney 05/24/2018, 4:06 PM

## 2018-05-24 NOTE — Progress Notes (Signed)
Subjective:  Apparently ready for D/C- she is not aware   Objective Vital signs in last 24 hours: Vitals:   05/23/18 0504 05/23/18 1328 05/23/18 2051 05/24/18 0449  BP: (!) 160/72 138/62 (!) 165/65 (!) 159/73  Pulse: 94 80 84 92  Resp: 12 16  14   Temp: 98.8 F (37.1 C) 98.7 F (37.1 C) 98.9 F (37.2 C) 99.1 F (37.3 C)  TempSrc: Oral Oral Oral Oral  SpO2: 100% 100% 100% 100%  Weight:      Height:       Weight change:   Intake/Output Summary (Last 24 hours) at 05/24/2018 9147 Last data filed at 05/23/2018 1800 Gross per 24 hour  Intake 480 ml  Output -  Net 480 ml    Assessment/ Plan: Pt is a 67 y.o. yo female with ESRD who was admitted on 05/16/2018 with femoral neck fx- s/p ORIF 10/16  Assessment/Plan: 1. Hip fx- s/p ORIF 10/16- PT 2. ESRD - normally TTS [facility] f/b Dr. Hinda Lenis.  Have off schedule here- done Fri and  Monday, now that plan is for discharge today - her labs do not indicate need for HD today so she can go to SNF and resume her regular HD tomorrow at her home unit, will still get 3 treatments in this week  3. Possible dementia- work up here [employer]- not sure what disposition is  3. Anemia- req transfusion this hosp- last hgb in the 9's- ESA ordered as well as iron 4. Secondary hyperparathyroidism- on calcitriol and phoslo - phos at goal even low - last PTH 743 5. HTN/volume- BP seems to drop much in HD- off of HD 140-160- only on IV prn meds here- on nifed 90 as OP has not been given here- would not restart   Louis Meckel    Labs: Basic Metabolic Panel: Recent Labs  Lab 05/21/18 0707 05/22/18 0557 05/24/18 0210  NA 138 135 132*  K 3.8 4.2 3.4*  CL 99 97* 96*  CO2 25 26 24   GLUCOSE 81 91 83  BUN 48* 65* 38*  CREATININE 6.56* 7.58* 5.50*  CALCIUM 8.5* 8.5* 8.7*  PHOS 3.8 3.5 2.4*   Liver Function Tests: Recent Labs  Lab 05/21/18 0707 05/22/18 0557 05/24/18 0210  ALBUMIN 2.5* 2.3* 2.4*   No results for input(s):  LIPASE, AMYLASE in the last 168 hours. No results for input(s): AMMONIA in the last 168 hours. CBC: Recent Labs  Lab 05/19/18 0116 05/20/18 0426 05/21/18 0707 05/22/18 0557 05/24/18 0210  WBC 8.6 7.8 7.1 7.0 7.4  NEUTROABS 6.7 5.3 5.0 4.4 4.5  HGB 7.6* 6.9* 9.7* 9.4* 9.1*  HCT 24.5* 22.4* 30.1* 29.5* 28.9*  MCV 81.4 80.9 81.6 81.7 83.3  PLT 228 241 241 252 291   Cardiac Enzymes: No results for input(s): CKTOTAL, CKMB, CKMBINDEX, TROPONINI in the last 168 hours. CBG: No results for input(s): GLUCAP in the last 168 hours.  Iron Studies: No results for input(s): IRON, TIBC, TRANSFERRIN, FERRITIN in the last 72 hours. Studies/Results: Ct Angio Head W Or Wo Contrast  Result Date: 05/22/2018 CLINICAL DATA:  Follow up stroke. History of intracranial hemorrhage, hypertension, end-stage renal disease on dialysis, uterine cancer. EXAM: CT ANGIOGRAPHY HEAD AND NECK TECHNIQUE: Multidetector CT imaging of the head and neck was performed using the standard protocol during bolus administration of intravenous contrast. Multiplanar CT image reconstructions and MIPs were obtained to evaluate the vascular anatomy. Carotid stenosis measurements (when applicable) are obtained utilizing NASCET criteria, using the distal internal  carotid diameter as the denominator. CONTRAST:  30mL ISOVUE-370 IOPAMIDOL (ISOVUE-370) INJECTION 76% COMPARISON:  MRI head May 20, 2018 and MRA head September 26, 2017 FINDINGS: CT HEAD FINDINGS BRAIN: No intraparenchymal hemorrhage, mass effect nor midline shift. No advanced parenchymal brain volume loss for age. No hydrocephalus. Confluent supratentorial white matter hypodensities. Old bilateral basal ganglia and thalami lacunar infarcts. Acute large vascular territory infarcts. No abnormal extra-axial fluid collections. Scattered punctate, possibly intraparenchymal calcifications. LEFT temporal lobe encephalomalacia associated with large meningoencephalocele extending to LEFT  sphenoid sinus. Basal cisterns are patent. VASCULAR: Severe calcific atherosclerosis of the carotid siphons. SKULL: No skull fracture. No significant scalp soft tissue swelling. SINUSES/ORBITS: Trace paranasal sinus mucosal thickening. Small RIGHT mastoid effusion. Asymmetrically smaller LEFT optic nerve, query blindness. OTHER: Patient is edentulous. CTA NECK FINDINGS: AORTIC ARCH: Normal appearance of the thoracic arch, normal branch pattern. Mild calcific atherosclerosis aortic arch. The origins of the innominate, left Common carotid artery and subclavian artery are widely patent. RIGHT CAROTID SYSTEM: Common carotid artery is patent. Calcific atherosclerosis resulting in less than 50% stenosis by NASCET criteria. Patent internal carotid artery. LEFT CAROTID SYSTEM: Common carotid artery is patent. Normal appearance of the carotid bifurcation without hemodynamically significant stenosis by NASCET criteria. Normal appearance of the internal carotid artery. VERTEBRAL ARTERIES:Thready irregular RIGHT vertebral artery from the origin distally, occluded at distal V3 segment. Multifocal severe stenosis and calcific atherosclerosis LEFT vertebral artery. SKELETON: No acute osseous process though bone windows have not been submitted. OTHER NECK: Soft tissues of the neck are nonacute though, not tailored for evaluation. UPPER CHEST: Included lung apices are clear. No superior mediastinal lymphadenopathy. CTA HEAD FINDINGS: ANTERIOR CIRCULATION: Patent cervical internal carotid arteries, petrous, cavernous and supra clinoid internal carotid arteries. Severe stenosis RIGHT petrous segment due to atherosclerosis. Moderate bilateral cavernous stenosis and severe bilateral paraclinoid stenosis. Moderate stenosis patent anterior communicating artery. Patent anterior and middle cerebral arteries, moderate luminal irregularity compatible with atherosclerosis. Severe stenosis LEFT M2 segment, LEFT A2 segment. No large vessel  occlusion, contrast extravasation or aneurysm. POSTERIOR CIRCULATION: Occluded RIGHT vertebral artery with minimal retrograde flow distal RIGHT V4 segment. Multifocal severe stenosis LEFT V4 segment. Moderate stenosis diminutive basilar artery. Small RIGHT and possible LEFT posterior communicating artery present. Patent posterior cerebral arteries, moderate luminal irregularity compatible with atherosclerosis. Severe stenosis RIGHT P1 and LEFT P2 segments. No contrast extravasation or aneurysm. VENOUS SINUSES: Major dural venous sinuses are patent though not tailored for evaluation on this angiographic examination. ANATOMIC VARIANTS: None. DELAYED PHASE: No abnormal intracranial enhancement. MIP images reviewed. IMPRESSION: CT HEAD: 1. No acute intracranial process; known acute small infarct not apparent. 2. Severe chronic small vessel ischemic changes and multiple old small infarcts. 3. Large LEFT temporal pseudomeningocele. CTA NECK: 1. Less than 50% stenosis bilateral ICA. 2. Chronic RIGHT vertebral artery dissection, occluded distal RIGHT V3 segment. 3. Severe atherosclerosis versus chronic dissection LEFT vertebral artery with multifocal severe stenosis. CTA HEAD: 1. No emergent large vessel occlusion. Chronically occluded RIGHT V4 segment. 2. Multifocal severe ICA stenosis. Multifocal cerebral artery severe stenosis. Electronically Signed   By: Elon Alas M.D.   On: 05/22/2018 22:03   Ct Angio Neck W Or Wo Contrast  Result Date: 05/22/2018 CLINICAL DATA:  Follow up stroke. History of intracranial hemorrhage, hypertension, end-stage renal disease on dialysis, uterine cancer. EXAM: CT ANGIOGRAPHY HEAD AND NECK TECHNIQUE: Multidetector CT imaging of the head and neck was performed using the standard protocol during bolus administration of intravenous contrast. Multiplanar CT image reconstructions and  MIPs were obtained to evaluate the vascular anatomy. Carotid stenosis measurements (when applicable)  are obtained utilizing NASCET criteria, using the distal internal carotid diameter as the denominator. CONTRAST:  2mL ISOVUE-370 IOPAMIDOL (ISOVUE-370) INJECTION 76% COMPARISON:  MRI head May 20, 2018 and MRA head September 26, 2017 FINDINGS: CT HEAD FINDINGS BRAIN: No intraparenchymal hemorrhage, mass effect nor midline shift. No advanced parenchymal brain volume loss for age. No hydrocephalus. Confluent supratentorial white matter hypodensities. Old bilateral basal ganglia and thalami lacunar infarcts. Acute large vascular territory infarcts. No abnormal extra-axial fluid collections. Scattered punctate, possibly intraparenchymal calcifications. LEFT temporal lobe encephalomalacia associated with large meningoencephalocele extending to LEFT sphenoid sinus. Basal cisterns are patent. VASCULAR: Severe calcific atherosclerosis of the carotid siphons. SKULL: No skull fracture. No significant scalp soft tissue swelling. SINUSES/ORBITS: Trace paranasal sinus mucosal thickening. Small RIGHT mastoid effusion. Asymmetrically smaller LEFT optic nerve, query blindness. OTHER: Patient is edentulous. CTA NECK FINDINGS: AORTIC ARCH: Normal appearance of the thoracic arch, normal branch pattern. Mild calcific atherosclerosis aortic arch. The origins of the innominate, left Common carotid artery and subclavian artery are widely patent. RIGHT CAROTID SYSTEM: Common carotid artery is patent. Calcific atherosclerosis resulting in less than 50% stenosis by NASCET criteria. Patent internal carotid artery. LEFT CAROTID SYSTEM: Common carotid artery is patent. Normal appearance of the carotid bifurcation without hemodynamically significant stenosis by NASCET criteria. Normal appearance of the internal carotid artery. VERTEBRAL ARTERIES:Thready irregular RIGHT vertebral artery from the origin distally, occluded at distal V3 segment. Multifocal severe stenosis and calcific atherosclerosis LEFT vertebral artery. SKELETON: No acute  osseous process though bone windows have not been submitted. OTHER NECK: Soft tissues of the neck are nonacute though, not tailored for evaluation. UPPER CHEST: Included lung apices are clear. No superior mediastinal lymphadenopathy. CTA HEAD FINDINGS: ANTERIOR CIRCULATION: Patent cervical internal carotid arteries, petrous, cavernous and supra clinoid internal carotid arteries. Severe stenosis RIGHT petrous segment due to atherosclerosis. Moderate bilateral cavernous stenosis and severe bilateral paraclinoid stenosis. Moderate stenosis patent anterior communicating artery. Patent anterior and middle cerebral arteries, moderate luminal irregularity compatible with atherosclerosis. Severe stenosis LEFT M2 segment, LEFT A2 segment. No large vessel occlusion, contrast extravasation or aneurysm. POSTERIOR CIRCULATION: Occluded RIGHT vertebral artery with minimal retrograde flow distal RIGHT V4 segment. Multifocal severe stenosis LEFT V4 segment. Moderate stenosis diminutive basilar artery. Small RIGHT and possible LEFT posterior communicating artery present. Patent posterior cerebral arteries, moderate luminal irregularity compatible with atherosclerosis. Severe stenosis RIGHT P1 and LEFT P2 segments. No contrast extravasation or aneurysm. VENOUS SINUSES: Major dural venous sinuses are patent though not tailored for evaluation on this angiographic examination. ANATOMIC VARIANTS: None. DELAYED PHASE: No abnormal intracranial enhancement. MIP images reviewed. IMPRESSION: CT HEAD: 1. No acute intracranial process; known acute small infarct not apparent. 2. Severe chronic small vessel ischemic changes and multiple old small infarcts. 3. Large LEFT temporal pseudomeningocele. CTA NECK: 1. Less than 50% stenosis bilateral ICA. 2. Chronic RIGHT vertebral artery dissection, occluded distal RIGHT V3 segment. 3. Severe atherosclerosis versus chronic dissection LEFT vertebral artery with multifocal severe stenosis. CTA HEAD: 1. No  emergent large vessel occlusion. Chronically occluded RIGHT V4 segment. 2. Multifocal severe ICA stenosis. Multifocal cerebral artery severe stenosis. Electronically Signed   By: Elon Alas M.D.   On: 05/22/2018 22:03   Mr Brain Wo Contrast  Result Date: 05/23/2018 CLINICAL DATA:  67 year old female with altered mental status, weakness and dizziness with recent fall. Several small acute infarcts in the right cerebellum and parietal lobe on recent brain MRI.  Query new infarct or spinal cord compression from fall. History of dialysis, sphenoid sinus meningocele EXAM: MRI HEAD WITHOUT CONTRAST MRI CERVICAL SPINE WITHOUT CONTRAST TECHNIQUE: Multiplanar, multiecho pulse sequences of the brain and surrounding structures, and cervical spine, to include the craniocervical junction and cervicothoracic junction, were obtained without intravenous contrast. COMPARISON:  CTA head and neck 05/22/2018. Brain MRI 05/20/2018 and earlier. FINDINGS: MRI HEAD FINDINGS Brain: Stable small foci of restricted diffusion in the right cerebellar tonsil and along the posterior right sylvian fissure on series 5. No new restricted diffusion to suggest acute infarction. Chronic patchy and confluent cerebral white matter T2 and FLAIR hyperintensity. Chronic T2 heterogeneity throughout the deep gray matter nuclei compatible with chronic lacunar infarcts. Patchy and confluent chronic T2 hyperintensity in the pons. Numerous chronic micro hemorrhages scattered primarily in the deep gray matter, brainstem, and cerebellum are stable. Stable cortical encephalomalacia in the anterior left temporal lobe in association with the large left skull base meningoencephalocele (series 15, image 21) which has not significantly changed since 2018. No new signal abnormality. No midline shift, mass effect, evidence of mass lesion, ventriculomegaly, extra-axial collection or acute intracranial hemorrhage. Cervicomedullary junction and pituitary are within  normal limits. Vascular: Major intracranial vascular flow voids are stable from the recent CTA findings including evidence of distal right vertebral artery poor flow or occlusion on series 10, image 3. The other major vascular flow voids are preserved and appear stable since 2018. Skull and upper cervical spine: Cervical spine described below. Visualized bone marrow signal is within normal limits. Sinuses/Orbits: Stable and negative orbits. Large CSF containing meningoencephalocele occupying the left sphenoid sinus with small protrusion of left inferior temporal lobe is stable. Other paranasal sinuses remain well pneumatized. Other: Chronic right mastoid effusion is stable since 2018. Negative visible nasopharynx. Visible internal auditory structures appear normal. Scalp and face soft tissues appear negative. MRI CERVICAL SPINE FINDINGS Alignment: Normal, preserved cervical lordosis. Vertebrae: No marrow edema or evidence of acute osseous abnormality. Visualized bone marrow signal is within normal limits. Cord: Spinal cord signal is within normal limits at all visualized levels. Posterior Fossa, vertebral arteries, paraspinal tissues: No abnormal signal in the cervical spine ligamentous complex. Cervicomedullary junction is within normal limits. Absent right vertebral artery flow void in keeping with the CTA finding yesterday. Otherwise negative neck soft tissues. Negative visible lung apices. Disc levels: No cervical spinal stenosis despite intermittent mild disc bulging and ligament flavum hypertrophy. Mild bilateral cervical facet hypertrophy. No convincing cervical neural foraminal stenosis. Negative visible upper thoracic levels. IMPRESSION: 1. No new intracranial abnormality. Two of the small lacunar infarcts seen on 05/20/2018 remain visible. 2. Negative for age cervical spine MRI with no spinal stenosis or cervical spinal cord abnormality. 3. Nonacute right vertebral artery poor flow and occlusion as  demonstrated by CTA yesterday. 4. 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 5. 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 Electronically Signed   By: Genevie Ann M.D.   On: 05/23/2018 11:39   Mr Cervical Spine Wo Contrast  Result Date: 05/23/2018 CLINICAL DATA:  67 year old female with altered mental status, weakness and dizziness with recent fall. Several small acute infarcts in the right cerebellum and parietal lobe on recent brain MRI. Query new infarct or spinal cord compression from fall. History of dialysis, sphenoid sinus meningocele EXAM: MRI HEAD WITHOUT CONTRAST MRI CERVICAL SPINE WITHOUT CONTRAST TECHNIQUE:  Multiplanar, multiecho pulse sequences of the brain and surrounding structures, and cervical spine, to include the craniocervical junction and cervicothoracic junction, were obtained without intravenous contrast. COMPARISON:  CTA head and neck 05/22/2018. Brain MRI 05/20/2018 and earlier. FINDINGS: MRI HEAD FINDINGS Brain: Stable small foci of restricted diffusion in the right cerebellar tonsil and along the posterior right sylvian fissure on series 5. No new restricted diffusion to suggest acute infarction. Chronic patchy and confluent cerebral white matter T2 and FLAIR hyperintensity. Chronic T2 heterogeneity throughout the deep gray matter nuclei compatible with chronic lacunar infarcts. Patchy and confluent chronic T2 hyperintensity in the pons. Numerous chronic micro hemorrhages scattered primarily in the deep gray matter, brainstem, and cerebellum are stable. Stable cortical encephalomalacia in the anterior left temporal lobe in association with the large left skull base meningoencephalocele (series 15, image 21) which has not significantly changed since 2018. No new signal abnormality. No midline shift, mass  effect, evidence of mass lesion, ventriculomegaly, extra-axial collection or acute intracranial hemorrhage. Cervicomedullary junction and pituitary are within normal limits. Vascular: Major intracranial vascular flow voids are stable from the recent CTA findings including evidence of distal right vertebral artery poor flow or occlusion on series 10, image 3. The other major vascular flow voids are preserved and appear stable since 2018. Skull and upper cervical spine: Cervical spine described below. Visualized bone marrow signal is within normal limits. Sinuses/Orbits: Stable and negative orbits. Large CSF containing meningoencephalocele occupying the left sphenoid sinus with small protrusion of left inferior temporal lobe is stable. Other paranasal sinuses remain well pneumatized. Other: Chronic right mastoid effusion is stable since 2018. Negative visible nasopharynx. Visible internal auditory structures appear normal. Scalp and face soft tissues appear negative. MRI CERVICAL SPINE FINDINGS Alignment: Normal, preserved cervical lordosis. Vertebrae: No marrow edema or evidence of acute osseous abnormality. Visualized bone marrow signal is within normal limits. Cord: Spinal cord signal is within normal limits at all visualized levels. Posterior Fossa, vertebral arteries, paraspinal tissues: No abnormal signal in the cervical spine ligamentous complex. Cervicomedullary junction is within normal limits. Absent right vertebral artery flow void in keeping with the CTA finding yesterday. Otherwise negative neck soft tissues. Negative visible lung apices. Disc levels: No cervical spinal stenosis despite intermittent mild disc bulging and ligament flavum hypertrophy. Mild bilateral cervical facet hypertrophy. No convincing cervical neural foraminal stenosis. Negative visible upper thoracic levels. IMPRESSION: 1. No new intracranial abnormality. Two of the small lacunar infarcts seen on 05/20/2018 remain visible. 2.  Negative for age cervical spine MRI with no spinal stenosis or cervical spinal cord abnormality. 3. Nonacute right vertebral artery poor flow and occlusion as demonstrated by CTA yesterday. 4. 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 5. 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 Electronically Signed   By: Genevie Ann M.D.   On: 05/23/2018 11:39   Medications: Infusions: . ferric gluconate (FERRLECIT/NULECIT) IV Stopped (05/22/18 1451)    Scheduled Medications: . allopurinol  100 mg Oral Q Wed  . aspirin EC  81 mg Oral Daily  . atorvastatin  10 mg Oral q1800  . calcitRIOL  1 mcg Oral Q M,W,F-HD  . calcium acetate  667 mg Oral TID WC  . Chlorhexidine Gluconate Cloth  6 each Topical Q0600  . clopidogrel  75 mg Oral Daily  . darbepoetin (ARANESP) injection - DIALYSIS  150 mcg Intravenous Q Fri-HD  .  feeding supplement  1 Container Oral TID BM  . gabapentin  100 mg Oral QHS  . heparin injection (subcutaneous)  5,000 Units Subcutaneous Q8H  . levothyroxine  50 mcg Oral QAC breakfast  . lidocaine-prilocaine  1 application Topical Q D,VV,[id number]  . megestrol  40 mg Oral Daily  . multivitamin  1 tablet Oral QHS  . pentoxifylline  400 mg Oral TID WC  . polyethylene glycol  17 g Oral BID  . senna-docusate  1 tablet Oral BID    have reviewed scheduled and prn medications.  Physical Exam: General: pleasant- no c/o's Heart: RRR Lungs: mostly clear Abdomen: soft, non tender Extremities: no apprec edema Dialysis Access: right upper arm AVG    05/24/2018,9:09 AM  LOS: 8 days

## 2018-05-24 NOTE — Progress Notes (Signed)
Pt is A&O x3, sitting up in the chair today, mod x2 assist with transfers.  Report was given to [place] [facility] [place]. Discharged pt to SNF via [facility], a copy of discharge instructions was given to pt's daughter.

## 2018-05-24 NOTE — Care Management Important Message (Signed)
Important Message  Patient Details  Name: Sonya [name] MRN: [id number] Date of Birth: 08/25/1950   Medicare Important Message Given:  Yes    Leandro Berkowitz 05/24/2018, 10:02 AM

## 2018-05-24 NOTE — Discharge Summary (Signed)
Physician Discharge Summary  Sonya [name] YTK:[id number] DOB: 04-09-1951 DOA: 05/16/2018  PCP: Redmond School, MD  Admit date: 05/16/2018 Discharge date: 05/24/2018  Admitted From: Home  Disposition:  SNF  Recommendations for Outpatient Follow-up:  1. Follow up with PCP in 1-2 weeks 2. Please obtain BMP/CBC in one week 3. Needs to follow up with neurology post stroke, and for meningoencephalocele 4. Follow up with sx post sx     Discharge Condition: stable.  CODE STATUS: full code Diet recommendation: Heart Healthy  Brief/Interim Summary: Sonya [name] a 67 y.o.femalewith medical history of anemia of CKD, anxiety, ESRD on hemodialysis Tuesday/Thursday/Saturday, diverticulosis/diverticulitis, history of dysphagia, history of fracture of left femoral neck, GERD, hypertension, hyperparathyroidism due to renal insufficiency, hypothyroidism, intracerebral hemorrhage, meningocele, protein calorie malnutrition, ischemic stroke, history of uterine cancer presenting after mechanical fall going to the bathroom. The patient states that she was using her walker. She was trying to get up off the commode when her right leg "gave out", and the patient fell onto her right side. The patient had extreme pain and was unable to bear any weight. EMS was activated. In the emergency department, x-rays of the hip revealed an acute comminuted fracture of the right femoral diaphysis with half shaft width displacement. Pt admitted for further management.  Alert, mild pain at site of surgery. Had BM yesterday.   Assessment/Plan: Active Problems:   Essential hypertension   Hyperparathyroidism due to renal insufficiency (HCC)   Hypothyroidism, adult   Hyperlipidemia   End stage renal failure on dialysis [facility])   Fracture of condyle of right femur, closed, initial encounter ([place])   Closed fracture of right femur, unspecified fracture morphology, initial encounter ([place])   Cerebral embolism with  cerebral infarction  Acute comminuted fracture of the right femur s/p ORIF on 05/17/18 Orthopedics on board Pain management and DVT ppx as per ortho PT/OT on board stable for transfer to SNF.  Post sx 75-17  Acute metabolic encephalopathy likely 2/2 acute ischemia with ?underlying dementia Afebrile, no leukocytosis CT head unremarkable MRI showed multiple small foci of acute ischemia, one in R parietal lobe, 2-3 in right cerebellum. No acute hemorrhage or mass effect LDL 41,   A1c 5.2 ECHO done on 05/23/18 showed EF of 65-70%, Grade 1DD, PA peak pressure 36 mm Hg. Overall, image quality was poor CTA head and neck showed no emergent large vessel occlusion. Chronically occluded RIGHT V4 segment. Multifocal severe ICA stenosis. Multifocal cerebral artery severe stenosis. EEG showed presence ofperiodic discharges oftriphasicmorphology. These are seen most commonly in encephalopathic states. Although most often seen with hepatic encephalopathies, they arenot isolated to this clinical scenario. BLE venous duplex: Negative for DVT MRI spine w/o contrast: Negative for age cervical spine MRI with no spinal stenosis or cervical spinal cord abnormality  Neurology consulted, appreciate recs Continue ASA, lipitor, restarted plavix Continue cardiac telemetry Avoid hypotension.   ESRD Nephrology on board HD as per nephrology  Anemia of CKD/Acute blood loss post op Baseline hemoglobin 11-12 Hemoglobin 7.6-->6.9, s/p 2U of PRBC on 05/20/18 IV iron on HD days Monitor closely, daily cbc  Secondary hyperparathyroidism Management per nephrology  Essential hypertension BP labile, usually soft during HD Held nifedipine for ??permissive HTN  Hyperlipidemia Continue statin  Hypothyroidism Continue synthroid  Poor appetite/?malnourished  Dietician on board, appreciate recs Started Megace as daughter stated it worked in the past Dietary supplements    Discharge Diagnoses:   Active Problems:   Essential hypertension   Hyperparathyroidism due to renal insufficiency (  [place])   Hypothyroidism, adult   Hyperlipidemia   End stage renal failure on dialysis ([place])   Fracture of condyle of right femur, closed, initial encounter ([place])   Closed fracture of right femur, unspecified fracture morphology, initial encounter [facility])   Cerebral embolism with cerebral infarction    Discharge Instructions  Discharge Instructions    Ambulatory referral to Neurology   Complete by:  As directed    Follow up with stroke clinic NP (Jessica Vanschaick or Cecille Rubin, if both not available, consider Dr. Antony Contras, Dr. Bess Harvest, or Dr. Sarina Ill) at [facility] in about 4 weeks.   Diet - low sodium heart healthy   Complete by:  As directed    Increase activity slowly   Complete by:  As directed      Allergies as of 05/24/2018      Reactions   Cephalexin Itching      Medication List    STOP taking these medications   diphenhydrAMINE 25 mg capsule Commonly known as:  BENADRYL   NIFEdipine 90 MG 24 hr tablet Commonly known as:  ADALAT CC     TAKE these medications   allopurinol 100 MG tablet Commonly known as:  ZYLOPRIM Take 100 mg by mouth every Wednesday.   ALPRAZolam 0.5 MG tablet Commonly known as:  XANAX Take 1 tablet (0.5 mg total) by mouth 2 (two) times daily as needed for anxiety. What changed:    when to take this  reasons to take this   aspirin 81 MG EC tablet Take 1 tablet (81 mg total) by mouth daily.   atorvastatin 10 MG tablet Commonly known as:  LIPITOR Take 10 mg by mouth daily.   calcium acetate 667 MG capsule Commonly known as:  PHOSLO Take 1 capsule (667 mg total) by mouth 3 (three) times daily with meals.   cinacalcet 30 MG tablet Commonly known as:  SENSIPAR Take 30 mg by mouth daily.   clopidogrel 75 MG tablet Commonly known as:  PLAVIX Take 1 tablet (75 mg total) by mouth daily.   folic acid  1 MG tablet Commonly known as:  FOLVITE Take 1 mg by mouth 2 (two) times daily.   gabapentin 100 MG capsule Commonly known as:  NEURONTIN Take 100 mg by mouth at bedtime.   HYDROcodone-acetaminophen 5-325 MG tablet Commonly known as:  NORCO/VICODIN Take 1-2 tablets by mouth every 6 (six) hours as needed for moderate pain.   levothyroxine 50 MCG tablet Commonly known as:  SYNTHROID, LEVOTHROID Take 1 tablet (50 mcg total) by mouth daily. What changed:  when to take this   lidocaine-prilocaine cream Commonly known as:  EMLA Apply 1 application topically every Tuesday, Thursday, and Saturday at 6 PM.   megestrol 40 MG tablet Commonly known as:  MEGACE Take 1 tablet (40 mg total) by mouth daily. Start taking on:  05/25/2018   multivitamin Tabs tablet Take 1 tablet by mouth at bedtime.   pentoxifylline 400 MG CR tablet Commonly known as:  TRENTAL Take 400 mg by mouth 3 (three) times daily with meals.   polyethylene glycol packet Commonly known as:  MIRALAX / GLYCOLAX Take 17 g by mouth 2 (two) times daily.   senna-docusate 8.6-50 MG tablet Commonly known as:  Senokot-S Take 1 tablet by mouth 2 (two) times daily.   Vitamin D (Ergocalciferol) 50000 units Caps capsule Commonly known as:  DRISDOL Take 50,000 Units by mouth every Monday.      Follow-up Information  [facility] Follow up in 4 week(s).   Specialty:  Neurology Why:  stroke clinic. office will call with appt date and time. Contact information: [place] [phone]         Allergies  Allergen Reactions  . Cephalexin Itching    Consultations: Nephrology Ortho Neurology   Procedures/Studies: Ct Angio Head W Or Wo Contrast  Result Date: 05/22/2018 CLINICAL DATA:  Follow up stroke. History of intracranial hemorrhage, hypertension, end-stage renal disease on dialysis, uterine cancer. EXAM: CT ANGIOGRAPHY HEAD AND NECK  TECHNIQUE: Multidetector CT imaging of the head and neck was performed using the standard protocol during bolus administration of intravenous contrast. Multiplanar CT image reconstructions and MIPs were obtained to evaluate the vascular anatomy. Carotid stenosis measurements (when applicable) are obtained utilizing NASCET criteria, using the distal internal carotid diameter as the denominator. CONTRAST:  15mL ISOVUE-370 IOPAMIDOL (ISOVUE-370) INJECTION 76% COMPARISON:  MRI head May 20, 2018 and MRA head September 26, 2017 FINDINGS: CT HEAD FINDINGS BRAIN: No intraparenchymal hemorrhage, mass effect nor midline shift. No advanced parenchymal brain volume loss for age. No hydrocephalus. Confluent supratentorial white matter hypodensities. Old bilateral basal ganglia and thalami lacunar infarcts. Acute large vascular territory infarcts. No abnormal extra-axial fluid collections. Scattered punctate, possibly intraparenchymal calcifications. LEFT temporal lobe encephalomalacia associated with large meningoencephalocele extending to LEFT sphenoid sinus. Basal cisterns are patent. VASCULAR: Severe calcific atherosclerosis of the carotid siphons. SKULL: No skull fracture. No significant scalp soft tissue swelling. SINUSES/ORBITS: Trace paranasal sinus mucosal thickening. Small RIGHT mastoid effusion. Asymmetrically smaller LEFT optic nerve, query blindness. OTHER: Patient is edentulous. CTA NECK FINDINGS: AORTIC ARCH: Normal appearance of the thoracic arch, normal branch pattern. Mild calcific atherosclerosis aortic arch. The origins of the innominate, left Common carotid artery and subclavian artery are widely patent. RIGHT CAROTID SYSTEM: Common carotid artery is patent. Calcific atherosclerosis resulting in less than 50% stenosis by NASCET criteria. Patent internal carotid artery. LEFT CAROTID SYSTEM: Common carotid artery is patent. Normal appearance of the carotid bifurcation without hemodynamically significant  stenosis by NASCET criteria. Normal appearance of the internal carotid artery. VERTEBRAL ARTERIES:Thready irregular RIGHT vertebral artery from the origin distally, occluded at distal V3 segment. Multifocal severe stenosis and calcific atherosclerosis LEFT vertebral artery. SKELETON: No acute osseous process though bone windows have not been submitted. OTHER NECK: Soft tissues of the neck are nonacute though, not tailored for evaluation. UPPER CHEST: Included lung apices are clear. No superior mediastinal lymphadenopathy. CTA HEAD FINDINGS: ANTERIOR CIRCULATION: Patent cervical internal carotid arteries, petrous, cavernous and supra clinoid internal carotid arteries. Severe stenosis RIGHT petrous segment due to atherosclerosis. Moderate bilateral cavernous stenosis and severe bilateral paraclinoid stenosis. Moderate stenosis patent anterior communicating artery. Patent anterior and middle cerebral arteries, moderate luminal irregularity compatible with atherosclerosis. Severe stenosis LEFT M2 segment, LEFT A2 segment. No large vessel occlusion, contrast extravasation or aneurysm. POSTERIOR CIRCULATION: Occluded RIGHT vertebral artery with minimal retrograde flow distal RIGHT V4 segment. Multifocal severe stenosis LEFT V4 segment. Moderate stenosis diminutive basilar artery. Small RIGHT and possible LEFT posterior communicating artery present. Patent posterior cerebral arteries, moderate luminal irregularity compatible with atherosclerosis. Severe stenosis RIGHT P1 and LEFT P2 segments. No contrast extravasation or aneurysm. VENOUS SINUSES: Major dural venous sinuses are patent though not tailored for evaluation on this angiographic examination. ANATOMIC VARIANTS: None. DELAYED PHASE: No abnormal intracranial enhancement. MIP images reviewed. IMPRESSION: CT HEAD: 1. No acute intracranial process; known acute small infarct not apparent. 2. Severe chronic small  vessel ischemic changes and multiple old small infarcts.  3. Large LEFT temporal pseudomeningocele. CTA NECK: 1. Less than 50% stenosis bilateral ICA. 2. Chronic RIGHT vertebral artery dissection, occluded distal RIGHT V3 segment. 3. Severe atherosclerosis versus chronic dissection LEFT vertebral artery with multifocal severe stenosis. CTA HEAD: 1. No emergent large vessel occlusion. Chronically occluded RIGHT V4 segment. 2. Multifocal severe ICA stenosis. Multifocal cerebral artery severe stenosis. Electronically Signed   By: Elon Alas M.D.   On: 05/22/2018 22:03   Dg Chest 1 View  Result Date: 05/16/2018 CLINICAL DATA:  Fall this morning. EXAM: CHEST  1 VIEW COMPARISON:  09/26/2017 FINDINGS: The heart size and mediastinal contours are within normal limits. Both lungs are clear. The visualized skeletal structures are unremarkable. Calcification of the aorta. IMPRESSION: No active disease. Electronically Signed   By: Lucienne Capers M.D.   On: 05/16/2018 06:10   Ct Head Wo Contrast  Result Date: 05/20/2018 CLINICAL DATA:  Altered mental status. EXAM: CT HEAD WITHOUT CONTRAST TECHNIQUE: Contiguous axial images were obtained from the base of the skull through the vertex without intravenous contrast. COMPARISON:  MRI brain 09/26/2017. FINDINGS: Brain: Moderate atrophy and white matter disease is similar the prior exam. There is volume loss in the medial right temporal lobe. Although the patient had a recent infarct, there has been chronic loss of volume on the right. Chronic ischemic changes extend into the brainstem. No acute hemorrhage or mass lesion is present. No acute infarcts are evident. The ventricles are proportionate to the degree of atrophy. No significant extra-axial fluid collection is present. Vascular: Atherosclerotic calcifications are present within the cavernous internal carotid arteries bilaterally and at the dural margin of both vertebral arteries. There is no hyperdense vessel. Skull: Calvarium is intact. No focal lytic or blastic  lesions are present. Hyperostosis is present. No significant extracranial soft tissue lesion is noted. Sinuses/Orbits: There is chronic opacification of the left sphenoid sinus with a known meningoencephalocele. The paranasal sinuses and mastoid air cells are otherwise clear. Globes and orbits are within normal limits. IMPRESSION: 1. No acute intracranial abnormality or significant interval change. 2. Stable Vance atrophy and white matter disease. This likely reflects the sequela of chronic microvascular ischemia. 3. Asymmetric volume loss and remote ischemia involving the medial right temporal lobe. 4. Chronic left sphenoid sinus opacification. This represents a known meningoencephalocele . Electronically Signed   By: San Morelle M.D.   On: 05/20/2018 12:43   Ct Angio Neck W Or Wo Contrast  Result Date: 05/22/2018 CLINICAL DATA:  Follow up stroke. History of intracranial hemorrhage, hypertension, end-stage renal disease on dialysis, uterine cancer. EXAM: CT ANGIOGRAPHY HEAD AND NECK TECHNIQUE: Multidetector CT imaging of the head and neck was performed using the standard protocol during bolus administration of intravenous contrast. Multiplanar CT image reconstructions and MIPs were obtained to evaluate the vascular anatomy. Carotid stenosis measurements (when applicable) are obtained utilizing NASCET criteria, using the distal internal carotid diameter as the denominator. CONTRAST:  46mL ISOVUE-370 IOPAMIDOL (ISOVUE-370) INJECTION 76% COMPARISON:  MRI head May 20, 2018 and MRA head September 26, 2017 FINDINGS: CT HEAD FINDINGS BRAIN: No intraparenchymal hemorrhage, mass effect nor midline shift. No advanced parenchymal brain volume loss for age. No hydrocephalus. Confluent supratentorial white matter hypodensities. Old bilateral basal ganglia and thalami lacunar infarcts. Acute large vascular territory infarcts. No abnormal extra-axial fluid collections. Scattered punctate, possibly  intraparenchymal calcifications. LEFT temporal lobe encephalomalacia associated with large meningoencephalocele extending to LEFT sphenoid sinus. Basal cisterns are patent. VASCULAR: Severe  calcific atherosclerosis of the carotid siphons. SKULL: No skull fracture. No significant scalp soft tissue swelling. SINUSES/ORBITS: Trace paranasal sinus mucosal thickening. Small RIGHT mastoid effusion. Asymmetrically smaller LEFT optic nerve, query blindness. OTHER: Patient is edentulous. CTA NECK FINDINGS: AORTIC ARCH: Normal appearance of the thoracic arch, normal branch pattern. Mild calcific atherosclerosis aortic arch. The origins of the innominate, left Common carotid artery and subclavian artery are widely patent. RIGHT CAROTID SYSTEM: Common carotid artery is patent. Calcific atherosclerosis resulting in less than 50% stenosis by NASCET criteria. Patent internal carotid artery. LEFT CAROTID SYSTEM: Common carotid artery is patent. Normal appearance of the carotid bifurcation without hemodynamically significant stenosis by NASCET criteria. Normal appearance of the internal carotid artery. VERTEBRAL ARTERIES:Thready irregular RIGHT vertebral artery from the origin distally, occluded at distal V3 segment. Multifocal severe stenosis and calcific atherosclerosis LEFT vertebral artery. SKELETON: No acute osseous process though bone windows have not been submitted. OTHER NECK: Soft tissues of the neck are nonacute though, not tailored for evaluation. UPPER CHEST: Included lung apices are clear. No superior mediastinal lymphadenopathy. CTA HEAD FINDINGS: ANTERIOR CIRCULATION: Patent cervical internal carotid arteries, petrous, cavernous and supra clinoid internal carotid arteries. Severe stenosis RIGHT petrous segment due to atherosclerosis. Moderate bilateral cavernous stenosis and severe bilateral paraclinoid stenosis. Moderate stenosis patent anterior communicating artery. Patent anterior and middle cerebral arteries,  moderate luminal irregularity compatible with atherosclerosis. Severe stenosis LEFT M2 segment, LEFT A2 segment. No large vessel occlusion, contrast extravasation or aneurysm. POSTERIOR CIRCULATION: Occluded RIGHT vertebral artery with minimal retrograde flow distal RIGHT V4 segment. Multifocal severe stenosis LEFT V4 segment. Moderate stenosis diminutive basilar artery. Small RIGHT and possible LEFT posterior communicating artery present. Patent posterior cerebral arteries, moderate luminal irregularity compatible with atherosclerosis. Severe stenosis RIGHT P1 and LEFT P2 segments. No contrast extravasation or aneurysm. VENOUS SINUSES: Major dural venous sinuses are patent though not tailored for evaluation on this angiographic examination. ANATOMIC VARIANTS: None. DELAYED PHASE: No abnormal intracranial enhancement. MIP images reviewed. IMPRESSION: CT HEAD: 1. No acute intracranial process; known acute small infarct not apparent. 2. Severe chronic small vessel ischemic changes and multiple old small infarcts. 3. Large LEFT temporal pseudomeningocele. CTA NECK: 1. Less than 50% stenosis bilateral ICA. 2. Chronic RIGHT vertebral artery dissection, occluded distal RIGHT V3 segment. 3. Severe atherosclerosis versus chronic dissection LEFT vertebral artery with multifocal severe stenosis. CTA HEAD: 1. No emergent large vessel occlusion. Chronically occluded RIGHT V4 segment. 2. Multifocal severe ICA stenosis. Multifocal cerebral artery severe stenosis. Electronically Signed   By: Elon Alas M.D.   On: 05/22/2018 22:03   Mr Brain Wo Contrast  Result Date: 05/23/2018 CLINICAL DATA:  67 year old female with altered mental status, weakness and dizziness with recent fall. Several small acute infarcts in the right cerebellum and parietal lobe on recent brain MRI. Query new infarct or spinal cord compression from fall. History of dialysis, sphenoid sinus meningocele EXAM: MRI HEAD WITHOUT CONTRAST MRI CERVICAL  SPINE WITHOUT CONTRAST TECHNIQUE: Multiplanar, multiecho pulse sequences of the brain and surrounding structures, and cervical spine, to include the craniocervical junction and cervicothoracic junction, were obtained without intravenous contrast. COMPARISON:  CTA head and neck 05/22/2018. Brain MRI 05/20/2018 and earlier. FINDINGS: MRI HEAD FINDINGS Brain: Stable small foci of restricted diffusion in the right cerebellar tonsil and along the posterior right sylvian fissure on series 5. No new restricted diffusion to suggest acute infarction. Chronic patchy and confluent cerebral white matter T2 and FLAIR hyperintensity. Chronic T2 heterogeneity throughout the deep gray matter nuclei compatible  with chronic lacunar infarcts. Patchy and confluent chronic T2 hyperintensity in the pons. Numerous chronic micro hemorrhages scattered primarily in the deep gray matter, brainstem, and cerebellum are stable. Stable cortical encephalomalacia in the anterior left temporal lobe in association with the large left skull base meningoencephalocele (series 15, image 21) which has not significantly changed since 2018. No new signal abnormality. No midline shift, mass effect, evidence of mass lesion, ventriculomegaly, extra-axial collection or acute intracranial hemorrhage. Cervicomedullary junction and pituitary are within normal limits. Vascular: Major intracranial vascular flow voids are stable from the recent CTA findings including evidence of distal right vertebral artery poor flow or occlusion on series 10, image 3. The other major vascular flow voids are preserved and appear stable since 2018. Skull and upper cervical spine: Cervical spine described below. Visualized bone marrow signal is within normal limits. Sinuses/Orbits: Stable and negative orbits. Large CSF containing meningoencephalocele occupying the left sphenoid sinus with small protrusion of left inferior temporal lobe is stable. Other paranasal sinuses remain well  pneumatized. Other: Chronic right mastoid effusion is stable since 2018. Negative visible nasopharynx. Visible internal auditory structures appear normal. Scalp and face soft tissues appear negative. MRI CERVICAL SPINE FINDINGS Alignment: Normal, preserved cervical lordosis. Vertebrae: No marrow edema or evidence of acute osseous abnormality. Visualized bone marrow signal is within normal limits. Cord: Spinal cord signal is within normal limits at all visualized levels. Posterior Fossa, vertebral arteries, paraspinal tissues: No abnormal signal in the cervical spine ligamentous complex. Cervicomedullary junction is within normal limits. Absent right vertebral artery flow void in keeping with the CTA finding yesterday. Otherwise negative neck soft tissues. Negative visible lung apices. Disc levels: No cervical spinal stenosis despite intermittent mild disc bulging and ligament flavum hypertrophy. Mild bilateral cervical facet hypertrophy. No convincing cervical neural foraminal stenosis. Negative visible upper thoracic levels. IMPRESSION: 1. No new intracranial abnormality. Two of the small lacunar infarcts seen on 05/20/2018 remain visible. 2. Negative for age cervical spine MRI with no spinal stenosis or cervical spinal cord abnormality. 3. Nonacute right vertebral artery poor flow and occlusion as demonstrated by CTA yesterday. 4. 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 5. 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 Electronically Signed   By: Genevie Ann M.D.   On: 05/23/2018 11:39   Mr Brain Wo Contrast  Result Date: 05/20/2018 CLINICAL DATA:  Altered mental status EXAM: MRI HEAD WITHOUT CONTRAST TECHNIQUE: Multiplanar, multiecho pulse sequences of the brain and surrounding structures were obtained without intravenous contrast.  COMPARISON:  Head CT 05/20/2018 Brain MRI 09/26/2017 FINDINGS: BRAIN: There is a small focus of diffusion restriction within the right parietal lobe, measuring approximately 4 mm. There are 2-3 punctate foci of diffusion restriction in the inferior right cerebellar hemisphere. The midline structures are normal. No midline shift or other mass effect. Bilateral old thalamic infarcts. Diffuse confluent hyperintense T2-weighted signal within the periventricular, deep and juxtacortical white matter, most commonly due to chronic ischemic microangiopathy. The cerebral and cerebellar volume are age-appropriate. There are numerous chronic microhemorrhages in a predominantly central distribution. VASCULAR: Major intracranial arterial and venous sinus flow voids are normal. SKULL AND UPPER CERVICAL SPINE: Calvarial bone marrow signal is normal. There is no skull base mass. Visualized upper cervical spine and soft tissues are normal. SINUSES/ORBITS: Right-greater-than-left mastoid effusions. Unchanged left sphenoid sinus meningoencephalocele. The orbits are normal. IMPRESSION: 1. Multiple small foci  of acute ischemia: 1 in the right parietal lobe and 2-3 in the right cerebellum. No acute hemorrhage or mass effect. 2. 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 3. 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 4. Unchanged left sphenoid sinus meningoencephalocele. Electronically Signed   By: Ulyses Jarred M.D.   On: 05/20/2018 23:18   Mr Cervical Spine Wo Contrast  Result Date: 05/23/2018 CLINICAL DATA:  67 year old female with altered mental status, weakness and dizziness with recent fall. Several small acute infarcts in the right cerebellum and parietal lobe on recent brain MRI. Query new infarct or spinal cord compression from fall. History of dialysis, sphenoid sinus meningocele EXAM: MRI HEAD WITHOUT CONTRAST MRI CERVICAL SPINE WITHOUT CONTRAST TECHNIQUE:  Multiplanar, multiecho pulse sequences of the brain and surrounding structures, and cervical spine, to include the craniocervical junction and cervicothoracic junction, were obtained without intravenous contrast. COMPARISON:  CTA head and neck 05/22/2018. Brain MRI 05/20/2018 and earlier. FINDINGS: MRI HEAD FINDINGS Brain: Stable small foci of restricted diffusion in the right cerebellar tonsil and along the posterior right sylvian fissure on series 5. No new restricted diffusion to suggest acute infarction. Chronic patchy and confluent cerebral white matter T2 and FLAIR hyperintensity. Chronic T2 heterogeneity throughout the deep gray matter nuclei compatible with chronic lacunar infarcts. Patchy and confluent chronic T2 hyperintensity in the pons. Numerous chronic micro hemorrhages scattered primarily in the deep gray matter, brainstem, and cerebellum are stable. Stable cortical encephalomalacia in the anterior left temporal lobe in association with the large left skull base meningoencephalocele (series 15, image 21) which has not significantly changed since 2018. No new signal abnormality. No midline shift, mass effect, evidence of mass lesion, ventriculomegaly, extra-axial collection or acute intracranial hemorrhage. Cervicomedullary junction and pituitary are within normal limits. Vascular: Major intracranial vascular flow voids are stable from the recent CTA findings including evidence of distal right vertebral artery poor flow or occlusion on series 10, image 3. The other major vascular flow voids are preserved and appear stable since 2018. Skull and upper cervical spine: Cervical spine described below. Visualized bone marrow signal is within normal limits. Sinuses/Orbits: Stable and negative orbits. Large CSF containing meningoencephalocele occupying the left sphenoid sinus with small protrusion of left inferior temporal lobe is stable. Other paranasal sinuses remain well pneumatized. Other: Chronic right  mastoid effusion is stable since 2018. Negative visible nasopharynx. Visible internal auditory structures appear normal. Scalp and face soft tissues appear negative. MRI CERVICAL SPINE FINDINGS Alignment: Normal, preserved cervical lordosis. Vertebrae: No marrow edema or evidence of acute osseous abnormality. Visualized bone marrow signal is within normal limits. Cord: Spinal cord signal is within normal limits at all visualized levels. Posterior Fossa, vertebral arteries, paraspinal tissues: No abnormal signal in the cervical spine ligamentous complex. Cervicomedullary junction is within normal limits. Absent right vertebral artery flow void in keeping with the CTA finding yesterday. Otherwise negative neck soft tissues. Negative visible lung apices. Disc levels: No cervical spinal stenosis despite intermittent mild disc bulging and ligament flavum hypertrophy. Mild bilateral cervical facet hypertrophy. No convincing cervical neural foraminal stenosis. Negative visible upper thoracic levels. IMPRESSION: 1. No new intracranial abnormality. Two of the small lacunar infarcts seen on 05/20/2018 remain visible. 2. Negative for age cervical spine MRI with no spinal stenosis or cervical spinal cord abnormality. 3. Nonacute right vertebral artery poor flow and occlusion as demonstrated by CTA yesterday. 4. Advanced chronic small vessel disease in the brain including numerous chronic micro hemorrhages in the deep gray  matter, brainstem, and cerebellum. 5. 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 Electronically Signed   By: Genevie Ann M.D.   On: 05/23/2018 11:39   Dg Chest Port 1 View  Result Date: 05/20/2018 CLINICAL DATA:  Altered mental status. EXAM: PORTABLE CHEST 1 VIEW COMPARISON:  Radiograph of May 16, 2018. FINDINGS: The heart size and mediastinal contours are within normal limits. Both lungs are clear. The visualized skeletal  structures are unremarkable. IMPRESSION: No active disease. Electronically Signed   By: Marijo Conception, M.D.   On: 05/20/2018 15:52   Dg Knee Complete 4 Views Right  Result Date: 05/16/2018 CLINICAL DATA:  67 y/o F; fall from toilet with right posterior knee pain. EXAM: RIGHT KNEE - COMPLETE 4+ VIEW; DG HIP (WITH OR WITHOUT PELVIS) 2-3V RIGHT COMPARISON:  None. FINDINGS: Right hip: Pin fixation of the left femur neck. Hip joints, pubic symphysis, and sacroiliac joints are well maintained. No pelvic fracture or diastasis. No acute fracture of the right proximal femur identified. Vascular calcifications noted. Right knee: Acute comminuted 1/2 shaft's width displaced fracture of the distal femoral diaphysis within anterior and lateral apex angulation. No knee joint dislocation. Osteoarthrosis of the knee joint with lateral greater than medial femorotibial compartment joint space narrowing and periarticular osteophytes. No joint effusion. Vascular calcifications noted. IMPRESSION: Acute comminuted 1/2 shaft's width displaced fracture of the right distal femoral diaphysis within anterior and lateral apex angulation. Electronically Signed   By: Kristine Garbe M.D.   On: 05/16/2018 06:10   Dg C-arm 1-60 Min  Result Date: 05/17/2018 CLINICAL DATA:  ORIF right femur fracture EXAM: DG C-ARM 61-120 MIN; RIGHT FEMUR 2 VIEWS FLUOROSCOPY TIME:  1 minutes, 44 seconds COMPARISON:  Right femur radiographs-05/16/2018 FINDINGS: Seven spot intraoperative fluoroscopic images the distal aspect the right femur are provided for review Initial images demonstrate known comminuted displaced distal femur fracture. Subsequent images demonstrate sideplate fixation of the femur fracture with multiple transfixing cancellous screws. Improved alignment of the fracture fragments with minimal residual displacement. Expected adjacent subcutaneous emphysema. No radiopaque foreign body. IMPRESSION: Post sideplate fixation of  comminuted distal femur fracture without evidence of complication. Electronically Signed   By: Sandi Mariscal M.D.   On: 05/17/2018 15:57   Dg Hip Unilat W Or Wo Pelvis 2-3 Views Right  Result Date: 05/16/2018 CLINICAL DATA:  67 y/o F; fall from toilet with right posterior knee pain. EXAM: RIGHT KNEE - COMPLETE 4+ VIEW; DG HIP (WITH OR WITHOUT PELVIS) 2-3V RIGHT COMPARISON:  None. FINDINGS: Right hip: Pin fixation of the left femur neck. Hip joints, pubic symphysis, and sacroiliac joints are well maintained. No pelvic fracture or diastasis. No acute fracture of the right proximal femur identified. Vascular calcifications noted. Right knee: Acute comminuted 1/2 shaft's width displaced fracture of the distal femoral diaphysis within anterior and lateral apex angulation. No knee joint dislocation. Osteoarthrosis of the knee joint with lateral greater than medial femorotibial compartment joint space narrowing and periarticular osteophytes. No joint effusion. Vascular calcifications noted. IMPRESSION: Acute comminuted 1/2 shaft's width displaced fracture of the right distal femoral diaphysis within anterior and lateral apex angulation. Electronically Signed   By: Kristine Garbe M.D.   On: 05/16/2018 06:10   Dg Femur, Min 2 Views Right  Result Date: 05/17/2018 CLINICAL DATA:  ORIF right femur fracture EXAM: DG C-ARM 61-120 MIN; RIGHT FEMUR 2 VIEWS FLUOROSCOPY TIME:  1 minutes, 44 seconds COMPARISON:  Right femur radiographs-05/16/2018 FINDINGS: Seven spot  intraoperative fluoroscopic images the distal aspect the right femur are provided for review Initial images demonstrate known comminuted displaced distal femur fracture. Subsequent images demonstrate sideplate fixation of the femur fracture with multiple transfixing cancellous screws. Improved alignment of the fracture fragments with minimal residual displacement. Expected adjacent subcutaneous emphysema. No radiopaque foreign body. IMPRESSION: Post  sideplate fixation of comminuted distal femur fracture without evidence of complication. Electronically Signed   By: Sandi Mariscal M.D.   On: 05/17/2018 15:57   Dg Femur Port, Min 2 Views Right  Result Date: 05/17/2018 CLINICAL DATA:  Postop femur fracture EXAM: RIGHT FEMUR PORTABLE 2 VIEW COMPARISON:  05/17/2018, 05/16/2018 FINDINGS: Vascular calcifications. Right femoral head projects in joint. Interval surgical plate and multiple screw fixation of the right femur for comminuted distal femoral fracture. Decreased displacement of fracture fragments. Gas in the soft tissues consistent with recent surgery. IMPRESSION: Interval surgical plate and multiple screw fixation of comminuted distal femoral fracture with decreased displacement and angulation. Electronically Signed   By: Donavan Foil M.D.   On: 05/17/2018 21:09   (Echo, Carotid, EGD, Colonoscopy, ERCP)    Subjective:   Discharge Exam: Vitals:   05/23/18 2051 05/24/18 0449  BP: (!) 165/65 (!) 159/73  Pulse: 84 92  Resp:  14  Temp: 98.9 F (37.2 C) 99.1 F (37.3 C)  SpO2: 100% 100%   Vitals:   05/23/18 0504 05/23/18 1328 05/23/18 2051 05/24/18 0449  BP: (!) 160/72 138/62 (!) 165/65 (!) 159/73  Pulse: 94 80 84 92  Resp: 12 16  14   Temp: 98.8 F (37.1 C) 98.7 F (37.1 C) 98.9 F (37.2 C) 99.1 F (37.3 C)  TempSrc: Oral Oral Oral Oral  SpO2: 100% 100% 100% 100%  Weight:      Height:        General: Pt is alert, awake, not in acute distress Cardiovascular: RRR, S1/S2 +, no rubs, no gallops Respiratory: CTA bilaterally, no wheezing, no rhonchi Abdominal: Soft, NT, ND, bowel sounds + Extremities: no edema, no cyanosis    The results of significant diagnostics from this hospitalization (including imaging, microbiology, ancillary and laboratory) are listed below for reference.     Microbiology: Recent Results (from the past 240 hour(s))  Surgical PCR screen     Status: None   Collection Time: 05/16/18  6:11 PM  Result  Value Ref Range Status   MRSA, PCR NEGATIVE NEGATIVE Final   Staphylococcus aureus NEGATIVE NEGATIVE Final    Comment: (NOTE) The Xpert SA Assay (FDA approved for NASAL specimens in patients 90 years of age and older), is one component of a comprehensive surveillance program. It is not intended to diagnose infection nor to guide or monitor treatment. Performed at [facility], [place] [address], [place],  [address]      Labs: BNP (last 3 results) No results for input(s): BNP in the last 8760 hours. Basic Metabolic Panel: Recent Labs  Lab 05/17/18 1840 05/18/18 0246 05/19/18 0657 05/19/18 0713 05/21/18 0707 05/22/18 0557 05/24/18 0210  NA 140 136 136  --  138 135 132*  K 4.6 4.1 4.2  --  3.8 4.2 3.4*  CL 102 98 96*  --  99 97* 96*  CO2 21* 23 25  --  25 26 24   GLUCOSE 106* 110* 99  --  81 91 83  BUN 19 23 44*  --  48* 65* 38*  CREATININE 5.43* 5.97* 7.58*  --  6.56* 7.58* 5.50*  CALCIUM 7.8* 7.6* 8.0* 7.9*  8.5* 8.5* 8.7*  PHOS 5.6*  --  5.7*  --  3.8 3.5 2.4*   Liver Function Tests: Recent Labs  Lab 05/17/18 1840 05/19/18 0657 05/21/18 0707 05/22/18 0557 05/24/18 0210  ALBUMIN 2.9* 2.7* 2.5* 2.3* 2.4*   No results for input(s): LIPASE, AMYLASE in the last 168 hours. No results for input(s): AMMONIA in the last 168 hours. CBC: Recent Labs  Lab 05/19/18 0116 05/20/18 0426 05/21/18 0707 05/22/18 0557 05/24/18 0210  WBC 8.6 7.8 7.1 7.0 7.4  NEUTROABS 6.7 5.3 5.0 4.4 4.5  HGB 7.6* 6.9* 9.7* 9.4* 9.1*  HCT 24.5* 22.4* 30.1* 29.5* 28.9*  MCV 81.4 80.9 81.6 81.7 83.3  PLT 228 241 241 252 291   Cardiac Enzymes: No results for input(s): CKTOTAL, CKMB, CKMBINDEX, TROPONINI in the last 168 hours. BNP: Invalid input(s): POCBNP CBG: No results for input(s): GLUCAP in the last 168 hours. D-Dimer No results for input(s): DDIMER in the last 72 hours. Hgb A1c Recent Labs    05/22/18 0557  HGBA1C 5.2   Lipid Profile Recent Labs    05/22/18 0557   CHOL 111  HDL 36*  LDLCALC 41  TRIG 172*  CHOLHDL 3.1   Thyroid function studies No results for input(s): TSH, T4TOTAL, T3FREE, THYROIDAB in the last 72 hours.  Invalid input(s): FREET3 Anemia work up No results for input(s): VITAMINB12, FOLATE, FERRITIN, TIBC, IRON, RETICCTPCT in the last 72 hours. Urinalysis    Component Value Date/Time   COLORURINE YELLOW 04/14/2015 1427   APPEARANCEUR CLEAR 04/14/2015 1427   LABSPEC 1.020 04/14/2015 1427   PHURINE 8.5 (H) 04/14/2015 1427   GLUCOSEU 100 (A) 04/14/2015 1427   HGBUR SMALL (A) 04/14/2015 1427   BILIRUBINUR NEGATIVE 04/14/2015 1427   KETONESUR NEGATIVE 04/14/2015 1427   PROTEINUR >300 (A) 04/14/2015 1427   UROBILINOGEN 0.2 04/14/2015 1427   NITRITE NEGATIVE 04/14/2015 1427   LEUKOCYTESUR TRACE (A) 04/14/2015 1427   Sepsis Labs Invalid input(s): PROCALCITONIN,  WBC,  LACTICIDVEN Microbiology Recent Results (from the past 240 hour(s))  Surgical PCR screen     Status: None   Collection Time: 05/16/18  6:11 PM  Result Value Ref Range Status   MRSA, PCR NEGATIVE NEGATIVE Final   Staphylococcus aureus NEGATIVE NEGATIVE Final    Comment: (NOTE) The Xpert SA Assay (FDA approved for NASAL specimens in patients 5 years of age and older), is one component of a comprehensive surveillance program. It is not intended to diagnose infection nor to guide or monitor treatment. Performed at [facility], [place] [address] [place], [place] [address]      Time coordinating discharge: 35 minutes.   SIGNED:   Elmarie Shiley, MD  [facility] 05/24/2018, 1:12 PM Pager [phone]  If 7PM-7AM, please contact night-coverage [email or web address] Password [email or web address]

## 2018-05-24 NOTE — Plan of Care (Signed)
  Problem: Clinical Measurements: Goal: Ability to maintain clinical measurements within normal limits will improve Outcome: Progressing Goal: Will remain free from infection Outcome: Progressing   Problem: Nutrition: Goal: Adequate nutrition will be maintained Outcome: Progressing   Problem: Elimination: Goal: Will not experience complications related to bowel motility Outcome: Progressing   Problem: Safety: Goal: Ability to remain free from injury will improve Outcome: Progressing Note:  Pt remains free of injury. Bed locked and in lowest position. Bed alarm activated. Call bell within reach. Will continue to monitor.    Problem: Nutrition: Goal: Risk of aspiration will decrease Outcome: Progressing Goal: Dietary intake will improve Outcome: Progressing   Problem: Ischemic Stroke/TIA Tissue Perfusion: Goal: Complications of ischemic stroke/TIA will be minimized Outcome: Progressing

## 2018-05-24 NOTE — Progress Notes (Signed)
Patient will DC to: [facility] [place] date: 05/24/18 Family notified: Joanne Chars (daughter) Transport [id number]  Per MD patient ready for DC to [facility] . RN, patient, patient's family, and facility notified of DC. Discharge Summary sent to facility. RN given number for report [phone] [id number]. DC packet on chart. Ambulance transport requested for patient.  CSW signing off.  [place], [place]

## 2018-05-25 ENCOUNTER — Other Ambulatory Visit: Payer: Self-pay

## 2018-05-25 DIAGNOSIS — Z992 Dependence on renal dialysis: Secondary | ICD-10-CM | POA: Diagnosis not present

## 2018-05-25 DIAGNOSIS — N2581 Secondary hyperparathyroidism of renal origin: Secondary | ICD-10-CM | POA: Diagnosis not present

## 2018-05-25 DIAGNOSIS — N186 End stage renal disease: Secondary | ICD-10-CM | POA: Diagnosis not present

## 2018-05-25 DIAGNOSIS — D631 Anemia in chronic kidney disease: Secondary | ICD-10-CM | POA: Diagnosis not present

## 2018-05-25 DIAGNOSIS — I1311 Hypertensive heart and chronic kidney disease without heart failure, with stage 5 chronic kidney disease, or end stage renal disease: Secondary | ICD-10-CM | POA: Diagnosis not present

## 2018-05-25 DIAGNOSIS — I6340 Cerebral infarction due to embolism of unspecified cerebral artery: Secondary | ICD-10-CM | POA: Diagnosis not present

## 2018-05-25 DIAGNOSIS — S72451D Displaced supracondylar fracture without intracondylar extension of lower end of right femur, subsequent encounter for closed fracture with routine healing: Secondary | ICD-10-CM | POA: Diagnosis not present

## 2018-05-27 DIAGNOSIS — N186 End stage renal disease: Secondary | ICD-10-CM | POA: Diagnosis not present

## 2018-05-27 DIAGNOSIS — D631 Anemia in chronic kidney disease: Secondary | ICD-10-CM | POA: Diagnosis not present

## 2018-05-27 DIAGNOSIS — N2581 Secondary hyperparathyroidism of renal origin: Secondary | ICD-10-CM | POA: Diagnosis not present

## 2018-05-27 DIAGNOSIS — Z992 Dependence on renal dialysis: Secondary | ICD-10-CM | POA: Diagnosis not present

## 2018-05-30 DIAGNOSIS — Z992 Dependence on renal dialysis: Secondary | ICD-10-CM | POA: Diagnosis not present

## 2018-05-30 DIAGNOSIS — S72451D Displaced supracondylar fracture without intracondylar extension of lower end of right femur, subsequent encounter for closed fracture with routine healing: Secondary | ICD-10-CM | POA: Diagnosis not present

## 2018-05-30 DIAGNOSIS — I1311 Hypertensive heart and chronic kidney disease without heart failure, with stage 5 chronic kidney disease, or end stage renal disease: Secondary | ICD-10-CM | POA: Diagnosis not present

## 2018-05-30 DIAGNOSIS — D631 Anemia in chronic kidney disease: Secondary | ICD-10-CM | POA: Diagnosis not present

## 2018-05-30 DIAGNOSIS — N186 End stage renal disease: Secondary | ICD-10-CM | POA: Diagnosis not present

## 2018-05-30 DIAGNOSIS — N2581 Secondary hyperparathyroidism of renal origin: Secondary | ICD-10-CM | POA: Diagnosis not present

## 2018-05-30 DIAGNOSIS — I6340 Cerebral infarction due to embolism of unspecified cerebral artery: Secondary | ICD-10-CM | POA: Diagnosis not present

## 2018-06-01 DIAGNOSIS — D631 Anemia in chronic kidney disease: Secondary | ICD-10-CM | POA: Diagnosis not present

## 2018-06-01 DIAGNOSIS — Z992 Dependence on renal dialysis: Secondary | ICD-10-CM | POA: Diagnosis not present

## 2018-06-01 DIAGNOSIS — N186 End stage renal disease: Secondary | ICD-10-CM | POA: Diagnosis not present

## 2018-06-01 DIAGNOSIS — N2581 Secondary hyperparathyroidism of renal origin: Secondary | ICD-10-CM | POA: Diagnosis not present

## 2018-06-02 DIAGNOSIS — Z992 Dependence on renal dialysis: Secondary | ICD-10-CM | POA: Diagnosis not present

## 2018-06-02 DIAGNOSIS — D631 Anemia in chronic kidney disease: Secondary | ICD-10-CM | POA: Diagnosis not present

## 2018-06-02 DIAGNOSIS — D509 Iron deficiency anemia, unspecified: Secondary | ICD-10-CM | POA: Diagnosis not present

## 2018-06-02 DIAGNOSIS — N186 End stage renal disease: Secondary | ICD-10-CM | POA: Diagnosis not present

## 2018-06-03 DIAGNOSIS — N186 End stage renal disease: Secondary | ICD-10-CM | POA: Diagnosis not present

## 2018-06-03 DIAGNOSIS — Z992 Dependence on renal dialysis: Secondary | ICD-10-CM | POA: Diagnosis not present

## 2018-06-03 DIAGNOSIS — D631 Anemia in chronic kidney disease: Secondary | ICD-10-CM | POA: Diagnosis not present

## 2018-06-03 DIAGNOSIS — D509 Iron deficiency anemia, unspecified: Secondary | ICD-10-CM | POA: Diagnosis not present

## 2018-06-05 ENCOUNTER — Other Ambulatory Visit: Payer: Self-pay | Admitting: *Deleted

## 2018-06-05 DIAGNOSIS — I1311 Hypertensive heart and chronic kidney disease without heart failure, with stage 5 chronic kidney disease, or end stage renal disease: Secondary | ICD-10-CM | POA: Diagnosis not present

## 2018-06-05 DIAGNOSIS — N186 End stage renal disease: Secondary | ICD-10-CM | POA: Diagnosis not present

## 2018-06-05 DIAGNOSIS — S72451D Displaced supracondylar fracture without intracondylar extension of lower end of right femur, subsequent encounter for closed fracture with routine healing: Secondary | ICD-10-CM | POA: Diagnosis not present

## 2018-06-05 DIAGNOSIS — D631 Anemia in chronic kidney disease: Secondary | ICD-10-CM | POA: Diagnosis not present

## 2018-06-05 NOTE — Patient Outreach (Signed)
[facility]) Care Management  06/05/2018  Sonya [name] [birth year] [id number]   CSW received referral from [place], Natividad Brood that patient discharged 10/23 from [facility] to [facility] with fracture of the right femur s/p ORIF. [facility] consent was signed 05/22/18 with [place], Natividad Brood. Patient goes to dialysis on Tuesday/Thursday/Saturday schedule. CSW met with patient at SNF to discuss discharge plans. Patient reports that her daughter lives with her and her cousin was transporting her to dialysis prior to hospitalization and hopes that she will be able to continue to do that once she returns home. Patient states that she fell in the bathroom getting ready to go to dialysis and that's when she fractured her hip. Patient reports that she had been to [facility] before and had a good experience which led her to want to return and states that she has been receiving great care and progressing well with therapy. Patient had just returned from therapy when CSW met with her and she states that they are working on helping her get up and down but she has already noticed improvement. CSW provided patient with CSW business card and encouraged her to call if any questions came up but CSW plans to visit again in 2 weeks.    Raynaldo Opitz, LCSW [facility]  Clinical Social Worker cell #: [phone]

## 2018-06-06 DIAGNOSIS — Z992 Dependence on renal dialysis: Secondary | ICD-10-CM | POA: Diagnosis not present

## 2018-06-06 DIAGNOSIS — D509 Iron deficiency anemia, unspecified: Secondary | ICD-10-CM | POA: Diagnosis not present

## 2018-06-06 DIAGNOSIS — D631 Anemia in chronic kidney disease: Secondary | ICD-10-CM | POA: Diagnosis not present

## 2018-06-06 DIAGNOSIS — N186 End stage renal disease: Secondary | ICD-10-CM | POA: Diagnosis not present

## 2018-06-08 DIAGNOSIS — D509 Iron deficiency anemia, unspecified: Secondary | ICD-10-CM | POA: Diagnosis not present

## 2018-06-08 DIAGNOSIS — Z992 Dependence on renal dialysis: Secondary | ICD-10-CM | POA: Diagnosis not present

## 2018-06-08 DIAGNOSIS — N186 End stage renal disease: Secondary | ICD-10-CM | POA: Diagnosis not present

## 2018-06-08 DIAGNOSIS — D631 Anemia in chronic kidney disease: Secondary | ICD-10-CM | POA: Diagnosis not present

## 2018-06-10 DIAGNOSIS — D631 Anemia in chronic kidney disease: Secondary | ICD-10-CM | POA: Diagnosis not present

## 2018-06-10 DIAGNOSIS — D509 Iron deficiency anemia, unspecified: Secondary | ICD-10-CM | POA: Diagnosis not present

## 2018-06-10 DIAGNOSIS — Z992 Dependence on renal dialysis: Secondary | ICD-10-CM | POA: Diagnosis not present

## 2018-06-10 DIAGNOSIS — N186 End stage renal disease: Secondary | ICD-10-CM | POA: Diagnosis not present

## 2018-06-12 DIAGNOSIS — N186 End stage renal disease: Secondary | ICD-10-CM | POA: Diagnosis not present

## 2018-06-12 DIAGNOSIS — S72451D Displaced supracondylar fracture without intracondylar extension of lower end of right femur, subsequent encounter for closed fracture with routine healing: Secondary | ICD-10-CM | POA: Diagnosis not present

## 2018-06-12 DIAGNOSIS — I1311 Hypertensive heart and chronic kidney disease without heart failure, with stage 5 chronic kidney disease, or end stage renal disease: Secondary | ICD-10-CM | POA: Diagnosis not present

## 2018-06-12 DIAGNOSIS — Z992 Dependence on renal dialysis: Secondary | ICD-10-CM | POA: Diagnosis not present

## 2018-06-13 DIAGNOSIS — D631 Anemia in chronic kidney disease: Secondary | ICD-10-CM | POA: Diagnosis not present

## 2018-06-13 DIAGNOSIS — Z992 Dependence on renal dialysis: Secondary | ICD-10-CM | POA: Diagnosis not present

## 2018-06-13 DIAGNOSIS — D509 Iron deficiency anemia, unspecified: Secondary | ICD-10-CM | POA: Diagnosis not present

## 2018-06-13 DIAGNOSIS — N186 End stage renal disease: Secondary | ICD-10-CM | POA: Diagnosis not present

## 2018-06-15 DIAGNOSIS — D509 Iron deficiency anemia, unspecified: Secondary | ICD-10-CM | POA: Diagnosis not present

## 2018-06-15 DIAGNOSIS — N186 End stage renal disease: Secondary | ICD-10-CM | POA: Diagnosis not present

## 2018-06-15 DIAGNOSIS — D631 Anemia in chronic kidney disease: Secondary | ICD-10-CM | POA: Diagnosis not present

## 2018-06-15 DIAGNOSIS — Z992 Dependence on renal dialysis: Secondary | ICD-10-CM | POA: Diagnosis not present

## 2018-06-17 DIAGNOSIS — D631 Anemia in chronic kidney disease: Secondary | ICD-10-CM | POA: Diagnosis not present

## 2018-06-17 DIAGNOSIS — Z992 Dependence on renal dialysis: Secondary | ICD-10-CM | POA: Diagnosis not present

## 2018-06-17 DIAGNOSIS — D509 Iron deficiency anemia, unspecified: Secondary | ICD-10-CM | POA: Diagnosis not present

## 2018-06-17 DIAGNOSIS — N186 End stage renal disease: Secondary | ICD-10-CM | POA: Diagnosis not present

## 2018-06-19 DIAGNOSIS — S72351D Displaced comminuted fracture of shaft of right femur, subsequent encounter for closed fracture with routine healing: Secondary | ICD-10-CM | POA: Diagnosis not present

## 2018-06-20 ENCOUNTER — Ambulatory Visit: Payer: Medicare Other | Admitting: *Deleted

## 2018-06-20 DIAGNOSIS — D631 Anemia in chronic kidney disease: Secondary | ICD-10-CM | POA: Diagnosis not present

## 2018-06-20 DIAGNOSIS — Z992 Dependence on renal dialysis: Secondary | ICD-10-CM | POA: Diagnosis not present

## 2018-06-20 DIAGNOSIS — D509 Iron deficiency anemia, unspecified: Secondary | ICD-10-CM | POA: Diagnosis not present

## 2018-06-20 DIAGNOSIS — N186 End stage renal disease: Secondary | ICD-10-CM | POA: Diagnosis not present

## 2018-06-21 ENCOUNTER — Other Ambulatory Visit: Payer: Self-pay | Admitting: *Deleted

## 2018-06-21 DIAGNOSIS — E785 Hyperlipidemia, unspecified: Secondary | ICD-10-CM | POA: Diagnosis not present

## 2018-06-21 DIAGNOSIS — R52 Pain, unspecified: Secondary | ICD-10-CM | POA: Diagnosis not present

## 2018-06-21 DIAGNOSIS — I679 Cerebrovascular disease, unspecified: Secondary | ICD-10-CM | POA: Diagnosis not present

## 2018-06-21 NOTE — Patient Outreach (Signed)
[facility] ([facility]) Care Management  06/21/2018  Sonya [name] 01/08/1951 [id number]   CSW met with patient at [facility] to follow-up on her stay. Patient is now in room A-5 and reports that she has been making good progress with therapy - they are working on her walking and she has a rolling walker in her room. Patient states that she still plans to return home upon discharge and is very motivated though they have not started talking to her about when they plan on her discharging. CSW will plan to follow-up with patient in 2 more weeks.     , LCSW [facility]  Clinical Social Worker cell #: [phone]  

## 2018-06-22 DIAGNOSIS — D509 Iron deficiency anemia, unspecified: Secondary | ICD-10-CM | POA: Diagnosis not present

## 2018-06-22 DIAGNOSIS — D631 Anemia in chronic kidney disease: Secondary | ICD-10-CM | POA: Diagnosis not present

## 2018-06-22 DIAGNOSIS — N186 End stage renal disease: Secondary | ICD-10-CM | POA: Diagnosis not present

## 2018-06-22 DIAGNOSIS — Z992 Dependence on renal dialysis: Secondary | ICD-10-CM | POA: Diagnosis not present

## 2018-06-24 DIAGNOSIS — D631 Anemia in chronic kidney disease: Secondary | ICD-10-CM | POA: Diagnosis not present

## 2018-06-24 DIAGNOSIS — N186 End stage renal disease: Secondary | ICD-10-CM | POA: Diagnosis not present

## 2018-06-24 DIAGNOSIS — D509 Iron deficiency anemia, unspecified: Secondary | ICD-10-CM | POA: Diagnosis not present

## 2018-06-24 DIAGNOSIS — Z992 Dependence on renal dialysis: Secondary | ICD-10-CM | POA: Diagnosis not present

## 2018-06-27 ENCOUNTER — Ambulatory Visit: Payer: No Typology Code available for payment source | Admitting: Adult Health

## 2018-06-28 ENCOUNTER — Ambulatory Visit: Payer: Medicare Other | Admitting: Adult Health

## 2018-06-28 DIAGNOSIS — N186 End stage renal disease: Secondary | ICD-10-CM | POA: Diagnosis not present

## 2018-06-28 DIAGNOSIS — D509 Iron deficiency anemia, unspecified: Secondary | ICD-10-CM | POA: Diagnosis not present

## 2018-06-28 DIAGNOSIS — Z992 Dependence on renal dialysis: Secondary | ICD-10-CM | POA: Diagnosis not present

## 2018-06-28 DIAGNOSIS — D631 Anemia in chronic kidney disease: Secondary | ICD-10-CM | POA: Diagnosis not present

## 2018-06-30 DIAGNOSIS — D631 Anemia in chronic kidney disease: Secondary | ICD-10-CM | POA: Diagnosis not present

## 2018-06-30 DIAGNOSIS — N186 End stage renal disease: Secondary | ICD-10-CM | POA: Diagnosis not present

## 2018-06-30 DIAGNOSIS — Z992 Dependence on renal dialysis: Secondary | ICD-10-CM | POA: Diagnosis not present

## 2018-06-30 DIAGNOSIS — D509 Iron deficiency anemia, unspecified: Secondary | ICD-10-CM | POA: Diagnosis not present

## 2018-07-01 DIAGNOSIS — Z992 Dependence on renal dialysis: Secondary | ICD-10-CM | POA: Diagnosis not present

## 2018-07-01 DIAGNOSIS — D631 Anemia in chronic kidney disease: Secondary | ICD-10-CM | POA: Diagnosis not present

## 2018-07-01 DIAGNOSIS — D509 Iron deficiency anemia, unspecified: Secondary | ICD-10-CM | POA: Diagnosis not present

## 2018-07-01 DIAGNOSIS — N186 End stage renal disease: Secondary | ICD-10-CM | POA: Diagnosis not present

## 2018-07-02 DIAGNOSIS — Z992 Dependence on renal dialysis: Secondary | ICD-10-CM | POA: Diagnosis not present

## 2018-07-02 DIAGNOSIS — D631 Anemia in chronic kidney disease: Secondary | ICD-10-CM | POA: Diagnosis not present

## 2018-07-02 DIAGNOSIS — N186 End stage renal disease: Secondary | ICD-10-CM | POA: Diagnosis not present

## 2018-07-02 DIAGNOSIS — D509 Iron deficiency anemia, unspecified: Secondary | ICD-10-CM | POA: Diagnosis not present

## 2018-07-04 DIAGNOSIS — N186 End stage renal disease: Secondary | ICD-10-CM | POA: Diagnosis not present

## 2018-07-04 DIAGNOSIS — D631 Anemia in chronic kidney disease: Secondary | ICD-10-CM | POA: Diagnosis not present

## 2018-07-04 DIAGNOSIS — D509 Iron deficiency anemia, unspecified: Secondary | ICD-10-CM | POA: Diagnosis not present

## 2018-07-04 DIAGNOSIS — Z992 Dependence on renal dialysis: Secondary | ICD-10-CM | POA: Diagnosis not present

## 2018-07-06 DIAGNOSIS — Z992 Dependence on renal dialysis: Secondary | ICD-10-CM | POA: Diagnosis not present

## 2018-07-06 DIAGNOSIS — N186 End stage renal disease: Secondary | ICD-10-CM | POA: Diagnosis not present

## 2018-07-06 DIAGNOSIS — D509 Iron deficiency anemia, unspecified: Secondary | ICD-10-CM | POA: Diagnosis not present

## 2018-07-06 DIAGNOSIS — D631 Anemia in chronic kidney disease: Secondary | ICD-10-CM | POA: Diagnosis not present

## 2018-07-08 DIAGNOSIS — D631 Anemia in chronic kidney disease: Secondary | ICD-10-CM | POA: Diagnosis not present

## 2018-07-08 DIAGNOSIS — D509 Iron deficiency anemia, unspecified: Secondary | ICD-10-CM | POA: Diagnosis not present

## 2018-07-08 DIAGNOSIS — Z992 Dependence on renal dialysis: Secondary | ICD-10-CM | POA: Diagnosis not present

## 2018-07-08 DIAGNOSIS — N186 End stage renal disease: Secondary | ICD-10-CM | POA: Diagnosis not present

## 2018-07-10 ENCOUNTER — Other Ambulatory Visit: Payer: Self-pay | Admitting: *Deleted

## 2018-07-11 DIAGNOSIS — Z992 Dependence on renal dialysis: Secondary | ICD-10-CM | POA: Diagnosis not present

## 2018-07-11 DIAGNOSIS — D631 Anemia in chronic kidney disease: Secondary | ICD-10-CM | POA: Diagnosis not present

## 2018-07-11 DIAGNOSIS — N186 End stage renal disease: Secondary | ICD-10-CM | POA: Diagnosis not present

## 2018-07-11 DIAGNOSIS — D509 Iron deficiency anemia, unspecified: Secondary | ICD-10-CM | POA: Diagnosis not present

## 2018-07-12 DIAGNOSIS — I679 Cerebrovascular disease, unspecified: Secondary | ICD-10-CM | POA: Diagnosis not present

## 2018-07-12 DIAGNOSIS — R52 Pain, unspecified: Secondary | ICD-10-CM | POA: Diagnosis not present

## 2018-07-12 DIAGNOSIS — E785 Hyperlipidemia, unspecified: Secondary | ICD-10-CM | POA: Diagnosis not present

## 2018-07-13 DIAGNOSIS — N186 End stage renal disease: Secondary | ICD-10-CM | POA: Diagnosis not present

## 2018-07-13 DIAGNOSIS — D509 Iron deficiency anemia, unspecified: Secondary | ICD-10-CM | POA: Diagnosis not present

## 2018-07-13 DIAGNOSIS — D631 Anemia in chronic kidney disease: Secondary | ICD-10-CM | POA: Diagnosis not present

## 2018-07-13 DIAGNOSIS — Z992 Dependence on renal dialysis: Secondary | ICD-10-CM | POA: Diagnosis not present

## 2018-07-15 DIAGNOSIS — D509 Iron deficiency anemia, unspecified: Secondary | ICD-10-CM | POA: Diagnosis not present

## 2018-07-15 DIAGNOSIS — Z992 Dependence on renal dialysis: Secondary | ICD-10-CM | POA: Diagnosis not present

## 2018-07-15 DIAGNOSIS — D631 Anemia in chronic kidney disease: Secondary | ICD-10-CM | POA: Diagnosis not present

## 2018-07-15 DIAGNOSIS — N186 End stage renal disease: Secondary | ICD-10-CM | POA: Diagnosis not present

## 2018-07-18 DIAGNOSIS — D509 Iron deficiency anemia, unspecified: Secondary | ICD-10-CM | POA: Diagnosis not present

## 2018-07-18 DIAGNOSIS — Z992 Dependence on renal dialysis: Secondary | ICD-10-CM | POA: Diagnosis not present

## 2018-07-18 DIAGNOSIS — D631 Anemia in chronic kidney disease: Secondary | ICD-10-CM | POA: Diagnosis not present

## 2018-07-18 DIAGNOSIS — N186 End stage renal disease: Secondary | ICD-10-CM | POA: Diagnosis not present

## 2018-07-19 DIAGNOSIS — S72351D Displaced comminuted fracture of shaft of right femur, subsequent encounter for closed fracture with routine healing: Secondary | ICD-10-CM | POA: Diagnosis not present

## 2018-07-20 DIAGNOSIS — Z992 Dependence on renal dialysis: Secondary | ICD-10-CM | POA: Diagnosis not present

## 2018-07-20 DIAGNOSIS — D631 Anemia in chronic kidney disease: Secondary | ICD-10-CM | POA: Diagnosis not present

## 2018-07-20 DIAGNOSIS — N186 End stage renal disease: Secondary | ICD-10-CM | POA: Diagnosis not present

## 2018-07-20 DIAGNOSIS — D509 Iron deficiency anemia, unspecified: Secondary | ICD-10-CM | POA: Diagnosis not present

## 2018-07-22 DIAGNOSIS — D509 Iron deficiency anemia, unspecified: Secondary | ICD-10-CM | POA: Diagnosis not present

## 2018-07-22 DIAGNOSIS — D631 Anemia in chronic kidney disease: Secondary | ICD-10-CM | POA: Diagnosis not present

## 2018-07-22 DIAGNOSIS — Z992 Dependence on renal dialysis: Secondary | ICD-10-CM | POA: Diagnosis not present

## 2018-07-22 DIAGNOSIS — N186 End stage renal disease: Secondary | ICD-10-CM | POA: Diagnosis not present

## 2018-07-24 DIAGNOSIS — N186 End stage renal disease: Secondary | ICD-10-CM | POA: Diagnosis not present

## 2018-07-24 DIAGNOSIS — D631 Anemia in chronic kidney disease: Secondary | ICD-10-CM | POA: Diagnosis not present

## 2018-07-24 DIAGNOSIS — Z992 Dependence on renal dialysis: Secondary | ICD-10-CM | POA: Diagnosis not present

## 2018-07-24 DIAGNOSIS — D509 Iron deficiency anemia, unspecified: Secondary | ICD-10-CM | POA: Diagnosis not present

## 2018-07-24 NOTE — Patient Outreach (Signed)
[facility]) Care Management  07/24/2018  Sonya [name] 01-29-1951 [id number]   [place] went to meet with patient at [facility] but CSW was informed that patient had discharged the previous weekend. CSW called to follow-up on patient discharging home from [facility]. Patient reports that she has been doing well at home and has home health coming out, declined need for [facility] serivces at this time and her cousin is still able to transport her to dialysis. CSW informed patient of [place] should she need help with transportation in the future. CSW also encouraged patient to reach out to [facility] should any needs arise. CSW signing off.    Raynaldo Opitz, LCSW [facility]  Clinical Social Worker cell #: [phone]

## 2018-07-27 DIAGNOSIS — D631 Anemia in chronic kidney disease: Secondary | ICD-10-CM | POA: Diagnosis not present

## 2018-07-27 DIAGNOSIS — Z992 Dependence on renal dialysis: Secondary | ICD-10-CM | POA: Diagnosis not present

## 2018-07-27 DIAGNOSIS — N186 End stage renal disease: Secondary | ICD-10-CM | POA: Diagnosis not present

## 2018-07-27 DIAGNOSIS — D509 Iron deficiency anemia, unspecified: Secondary | ICD-10-CM | POA: Diagnosis not present

## 2018-07-29 DIAGNOSIS — Z992 Dependence on renal dialysis: Secondary | ICD-10-CM | POA: Diagnosis not present

## 2018-07-29 DIAGNOSIS — D631 Anemia in chronic kidney disease: Secondary | ICD-10-CM | POA: Diagnosis not present

## 2018-07-29 DIAGNOSIS — D509 Iron deficiency anemia, unspecified: Secondary | ICD-10-CM | POA: Diagnosis not present

## 2018-07-29 DIAGNOSIS — N186 End stage renal disease: Secondary | ICD-10-CM | POA: Diagnosis not present

## 2018-08-01 DIAGNOSIS — N186 End stage renal disease: Secondary | ICD-10-CM | POA: Diagnosis not present

## 2018-08-01 DIAGNOSIS — D509 Iron deficiency anemia, unspecified: Secondary | ICD-10-CM | POA: Diagnosis not present

## 2018-08-01 DIAGNOSIS — D631 Anemia in chronic kidney disease: Secondary | ICD-10-CM | POA: Diagnosis not present

## 2018-08-01 DIAGNOSIS — Z992 Dependence on renal dialysis: Secondary | ICD-10-CM | POA: Diagnosis not present

## 2018-08-02 DIAGNOSIS — N2581 Secondary hyperparathyroidism of renal origin: Secondary | ICD-10-CM | POA: Diagnosis not present

## 2018-08-02 DIAGNOSIS — N186 End stage renal disease: Secondary | ICD-10-CM | POA: Diagnosis not present

## 2018-08-02 DIAGNOSIS — D631 Anemia in chronic kidney disease: Secondary | ICD-10-CM | POA: Diagnosis not present

## 2018-08-02 DIAGNOSIS — M109 Gout, unspecified: Secondary | ICD-10-CM | POA: Diagnosis not present

## 2018-08-02 DIAGNOSIS — Z992 Dependence on renal dialysis: Secondary | ICD-10-CM | POA: Diagnosis not present

## 2018-08-02 DIAGNOSIS — E039 Hypothyroidism, unspecified: Secondary | ICD-10-CM | POA: Diagnosis not present

## 2018-08-02 DIAGNOSIS — I679 Cerebrovascular disease, unspecified: Secondary | ICD-10-CM | POA: Diagnosis not present

## 2018-08-02 DIAGNOSIS — D509 Iron deficiency anemia, unspecified: Secondary | ICD-10-CM | POA: Diagnosis not present

## 2018-08-03 DIAGNOSIS — D631 Anemia in chronic kidney disease: Secondary | ICD-10-CM | POA: Diagnosis not present

## 2018-08-03 DIAGNOSIS — Z992 Dependence on renal dialysis: Secondary | ICD-10-CM | POA: Diagnosis not present

## 2018-08-03 DIAGNOSIS — N186 End stage renal disease: Secondary | ICD-10-CM | POA: Diagnosis not present

## 2018-08-03 DIAGNOSIS — N2581 Secondary hyperparathyroidism of renal origin: Secondary | ICD-10-CM | POA: Diagnosis not present

## 2018-08-03 DIAGNOSIS — D509 Iron deficiency anemia, unspecified: Secondary | ICD-10-CM | POA: Diagnosis not present

## 2018-08-05 DIAGNOSIS — N2581 Secondary hyperparathyroidism of renal origin: Secondary | ICD-10-CM | POA: Diagnosis not present

## 2018-08-05 DIAGNOSIS — Z992 Dependence on renal dialysis: Secondary | ICD-10-CM | POA: Diagnosis not present

## 2018-08-05 DIAGNOSIS — N186 End stage renal disease: Secondary | ICD-10-CM | POA: Diagnosis not present

## 2018-08-05 DIAGNOSIS — D509 Iron deficiency anemia, unspecified: Secondary | ICD-10-CM | POA: Diagnosis not present

## 2018-08-05 DIAGNOSIS — D631 Anemia in chronic kidney disease: Secondary | ICD-10-CM | POA: Diagnosis not present

## 2018-08-08 DIAGNOSIS — Z992 Dependence on renal dialysis: Secondary | ICD-10-CM | POA: Diagnosis not present

## 2018-08-08 DIAGNOSIS — N186 End stage renal disease: Secondary | ICD-10-CM | POA: Diagnosis not present

## 2018-08-08 DIAGNOSIS — D509 Iron deficiency anemia, unspecified: Secondary | ICD-10-CM | POA: Diagnosis not present

## 2018-08-08 DIAGNOSIS — D631 Anemia in chronic kidney disease: Secondary | ICD-10-CM | POA: Diagnosis not present

## 2018-08-08 DIAGNOSIS — N2581 Secondary hyperparathyroidism of renal origin: Secondary | ICD-10-CM | POA: Diagnosis not present

## 2018-08-10 DIAGNOSIS — D631 Anemia in chronic kidney disease: Secondary | ICD-10-CM | POA: Diagnosis not present

## 2018-08-10 DIAGNOSIS — N2581 Secondary hyperparathyroidism of renal origin: Secondary | ICD-10-CM | POA: Diagnosis not present

## 2018-08-10 DIAGNOSIS — N186 End stage renal disease: Secondary | ICD-10-CM | POA: Diagnosis not present

## 2018-08-10 DIAGNOSIS — Z992 Dependence on renal dialysis: Secondary | ICD-10-CM | POA: Diagnosis not present

## 2018-08-10 DIAGNOSIS — D509 Iron deficiency anemia, unspecified: Secondary | ICD-10-CM | POA: Diagnosis not present

## 2018-08-11 NOTE — Progress Notes (Signed)
[facility] [address] [place]. [place] [address] [phone]       OFFICE FOLLOW UP NOTE  Ms. Sonya [name] Date of Birth:  28-Jun-1951 Medical Record Number:  [id number]   Reason for Referral:  hospital stroke follow up  CHIEF COMPLAINT:  Chief Complaint  Patient presents with  . Referral    Referral for hospital stroke room 9 pt with caregiver Butch Penny pt is at [facility] facility Pt is in dialysis tue , thu and saturday will be going home soon with family    HPI: Sonya [name] is being seen today for initial visit in the office for multiple right brain infarcts likely secondary to hypotension in the setting of known large vessel disease but cardiac source cannot be ruled out on 05/20/2018. History obtained from patient, facility caregiver and chart review. Reviewed all radiology images and labs personally.  Sonya [name] is a 68 y.o. female with history of ESRD on HD, HTN, ICH in 2016, Uterine cancer who presented to [facility] following fall off BSC w/ fx R femur. S/P ORIF 05/17/2018 with confusion, slurred speech and R>L hand weakness following.  CT head reviewed was negative for acute infarct.  MRI reviewed and showed 3 punctate infarcts (2-3 right cerebellum, 1 right parietal) and numerous chronic microhemorrhages.  CTA head shows severe small vessel disease.  CTA neck showed less than 50% bilateral ICA stenosis, chronic right VA dissection, occluded right V3, severe arthrosclerosis versus dissection left VA with multiple severe stenosis.  2D echo showed an EF of 65 to 70% without cardiac source of embolus identified.  EEG was negative for seizures.  Lower extremity venous Dopplers negative for DVT.  LDL 41 and A1c 5.2.  Right brain infarcts felt to be due to hypotension in setting of known large vessel disease which potentially occurred just prior to her fall with low suspicion of embolic source but could not be ruled out therefore recommended  30-day cardiac event monitor outpatient.  Previously on DAPT and recommended continue at discharge.  BP goal 1 30-1 50 given intracranial stenosis.  Patient discharged to SNF for continued therapies. History of strokes with most recent on 2/19 with right inferior mesial temporal lobe infarct.  Additional stroke on 10/14/2014 with subacute left external capsule small ICH.  Patient is being seen today for hospital follow-up and is accompanied by an aide at her current facility [facility].  She continues to receive PT/OT/ST with continued bilateral lower extremity weakness and dysarthria but does endorse improvement.  She states she will be discharged home on Wednesday where she will be residing with her daughter and plans on obtaining home therapies at that time.  She is currently sitting in wheelchair but is able to ambulate with physical therapy and rolling walker.  She does continue to endorse some right leg weakness due to right femur fracture status post ORIF.  She continues on aspirin and Plavix without side effects of bleeding or bruising.  Continues on atorvastatin without side effects of myalgias.  Blood pressure today slightly elevated at 159/83 but patient and aide both state typically satisfactory levels.  No further concerns at this time.  Denies new or worsening stroke/TIA symptoms.    ROS:   14 system review of systems performed and negative with exception of weakness and slurred speech  PMH:  Past Medical History:  Diagnosis Date  . Anemia   . Anxiety   . CKD (chronic kidney disease)   .  Diverticulitis   . Dysphagia   . ESRD (end stage renal disease) ([place])    On HD  . Fracture of femoral neck, left, closed ([place]) 05/30/2015  . Gastritis 06/2013 & 05/2015  . Gastroesophageal reflux disease with stricture 05/2015  . GERD (gastroesophageal reflux disease)   . HTN (hypertension)   . Hyperparathyroidism due to renal insufficiency ([place])   . Hypertensive urgency 06/2013 &  04/2015  . Hypothyroidism   . ICH (intracerebral hemorrhage) ([place]) 10/2014  . 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 Protein calorie malnutrition ([place])   . Stroke ([place])   . Uterine cancer ([place]) 2012  . Vertical diplopia   . Vertigo     PSH:  Past Surgical History:  Procedure Laterality Date  . ABDOMINAL AORTOGRAM N/A 04/05/2018   Procedure: ABDOMINAL AORTOGRAM;  Surgeon: Marty Heck, MD;  Location: [place] [facility];  Service: Cardiovascular;  Laterality: N/A;  . ABDOMINAL HYSTERECTOMY    . APPENDECTOMY    . BASCILIC VEIN TRANSPOSITION Right 09/17/2013   Procedure: BRACHIAL VEIN TRANSPOSITION;  Surgeon: Conrad Hi-Nella, MD;  Location: [place];  Service: Vascular;  Laterality: Right;  . BASCILIC VEIN TRANSPOSITION Right 10/29/2013   Procedure: RIGHT SECOND STAGE BRACHIAL VEIN TRANSPOSITION;  Surgeon: Conrad Kelley, MD;  Location: [place];  Service: Vascular;  Laterality: Right;  . CESAREAN SECTION    . CHOLECYSTECTOMY    . COLONOSCOPY  2000   TICS, IH  . COLONOSCOPY  2003 NUR BRBPR D50 V6   DC/[place] TICS, IH  . COLONOSCOPY N/A 09/04/2013   SLF: Small ulcer in the descending colon, one colon polyp (tubular adenoma), large internal hemorrhoids, moderate diverticulosis. next tcs 10 years.  . ESOPHAGOGASTRODUODENOSCOPY N/A 05/23/2015   WJX:BJYN non-erosive gastritis/stricture at the gastroesophageal junction  . ESOPHAGOGASTRODUODENOSCOPY (EGD) WITH ESOPHAGEAL DILATION N/A 06/08/2013   Dr. Fields:Stricture at the gastroesophageal junction/MILD NON-erosive gastritis (inflammation) was found in the gastric antrum; multiple biopsies/NO SOURCE FOR ANEMIA IDETIFIED-MOST LIKELY DUE TO ANEMIA OF CHRONIC DISEASE  . FLEXIBLE SIGMOIDOSCOPY N/A 01/02/2016   Dr. Oneida Alar: external hemorrhoids, diverticulosis, internal hemorrhoids, proximal rectum normal  . GIVENS CAPSULE STUDY N/A 12/03/2013   SLF: occasional gastric erosion. small bowel normal  . HIP PINNING,CANNULATED Left 05/31/2015   Procedure: CANNULATED HIP PINNING;   Surgeon: Leandrew Koyanagi, MD;  Location: [place];  Service: Orthopedics;  Laterality: Left;  . INSERTION OF DIALYSIS CATHETER Left 09/17/2013   Procedure: INSERTION OF DIALYSIS CATHETER;  Surgeon: Conrad Waterbury, MD;  Location: [place];  Service: Vascular;  Laterality: Left;  . LAPAROSCOPIC TOTAL HYSTERECTOMY    . LOWER EXTREMITY ANGIOGRAPHY Bilateral 04/05/2018   Procedure: Lower Extremity Angiography;  Surgeon: Marty Heck, MD;  Location: [place] [facility];  Service: Cardiovascular;  Laterality: Bilateral;  . NASAL SEPTUM SURGERY    . ORIF FEMUR FRACTURE Right 05/17/2018   Procedure: OPEN REDUCTION INTERNAL FIXATION (ORIF) DISTAL FEMUR FRACTURE;  Surgeon: Shona Needles, MD;  Location: [place];  Service: Orthopedics;  Laterality: Right;  . PERIPHERAL VASCULAR ATHERECTOMY Right 04/05/2018   Procedure: PERIPHERAL VASCULAR ATHERECTOMY;  Surgeon: Marty Heck, MD;  Location: [place] [facility];  Service: Cardiovascular;  Laterality: Right;  Peroneal   . PERIPHERAL VASCULAR BALLOON ANGIOPLASTY Right 04/05/2018   Procedure: PERIPHERAL VASCULAR BALLOON ANGIOPLASTY;  Surgeon: Marty Heck, MD;  Location: [place] [facility];  Service: Cardiovascular;  Laterality: Right;  Posterior tibial  . SAVORY DILATION N/A 05/23/2015   Procedure: SAVORY DILATION;  Surgeon: Carlyon Prows  Rexene Edison, MD;  [facility]: [facility];  Service: Endoscopy;  Laterality: N/A;  . THROMBECTOMY AND REVISION OF ARTERIOVENTOUS (AV) GORETEX  GRAFT Right 07/06/2017   Procedure: THROMBECTOMY AND REVISION OF ARTERIOVENOUS (AV) GORETEX  GRAFT RIGHT UPPER ARM;  Surgeon: Rosetta Posner, MD;  Location: [facility];  Service: Vascular;  Laterality: Right;    Social History:  Social History   Socioeconomic History  . Marital status: Widowed    Spouse name: Not on file  . Number of children: Not on file  . Years of education: Not on file  . Highest education level: Not on file  Occupational History  . Not on file  Social Needs  .  Financial resource strain: Not on file  . Food insecurity:    Worry: Not on file    Inability: Not on file  . Transportation needs:    Medical: Not on file    Non-medical: Not on file  Tobacco Use  . Smoking status: Never Smoker  . Smokeless tobacco: Never Used  Substance and Sexual Activity  . Alcohol use: No  . Drug use: No  . Sexual activity: Not on file  Lifestyle  . Physical activity:    Days per week: Not on file    Minutes per session: Not on file  . Stress: Not on file  Relationships  . Social connections:    Talks on phone: Not on file    Gets together: Not on file    Attends religious service: Not on file    Active member of club or organization: Not on file    Attends meetings of clubs or organizations: Not on file    Relationship status: Not on file  . Intimate partner violence:    Fear of current or ex partner: Not on file    Emotionally abused: Not on file    Physically abused: Not on file    Forced sexual activity: Not on file  Other Topics Concern  . Not on file  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    Family History:  Family History  Problem Relation Age of Onset  . Cancer Mother   . Diabetes Mother   . Hypertension Mother   . Diabetes Father   . Hypertension Father   . Hypertension Sister   . Colon cancer Neg Hx     Medications:   Current Outpatient Medications on File Prior to Visit  Medication Sig Dispense Refill  . allopurinol (ZYLOPRIM) 100 MG tablet Take 100 mg by mouth every Wednesday.     . ALPRAZolam (XANAX) 0.5 MG tablet Take 1 tablet (0.5 mg total) by mouth 2 (two) times daily as needed for anxiety. 10 tablet 0  . aspirin EC 81 MG EC tablet Take 1 tablet (81 mg total) by mouth daily. 30 tablet 1  . atorvastatin (LIPITOR) 10 MG tablet Take 10 mg by mouth daily.    . calcium acetate (PHOSLO) 667 MG capsule Take 1 capsule (667 mg total) by mouth 3 (three) times daily with meals. 90 capsule 0  .  cinacalcet (SENSIPAR) 30 MG tablet Take 30 mg by mouth daily.    . clopidogrel (PLAVIX) 75 MG tablet Take 1 tablet (75 mg total) by mouth daily. 30 tablet 11  . folic acid (FOLVITE) 1 MG tablet Take 1 mg by mouth 2 (two) times daily.     Marland Kitchen gabapentin (NEURONTIN) 100 MG capsule Take 100 mg by mouth  at bedtime.    Marland Kitchen HYDROcodone-acetaminophen (NORCO/VICODIN) 5-325 MG tablet Take 1-2 tablets by mouth every 6 (six) hours as needed for moderate pain. 30 tablet 0  . levothyroxine (SYNTHROID, LEVOTHROID) 50 MCG tablet Take 1 tablet (50 mcg total) by mouth daily. (Patient taking differently: Take 50 mcg by mouth daily before breakfast. ) 30 tablet 0  . lidocaine-prilocaine (EMLA) cream Apply 1 application topically every Tuesday, Thursday, and Saturday at 6 PM.     . megestrol (MEGACE) 40 MG tablet Take 1 tablet (40 mg total) by mouth daily. 20 tablet 0  . multivitamin (RENA-VIT) TABS tablet Take 1 tablet by mouth at bedtime. 30 each 0  . pentoxifylline (TRENTAL) 400 MG CR tablet Take 400 mg by mouth 3 (three) times daily with meals.  11  . polyethylene glycol (MIRALAX / GLYCOLAX) packet Take 17 g by mouth 2 (two) times daily. 14 each 0  . senna-docusate (SENOKOT-S) 8.6-50 MG tablet Take 1 tablet by mouth 2 (two) times daily. 20 tablet 0  . Vitamin D, Ergocalciferol, (DRISDOL) 50000 units CAPS capsule Take 50,000 Units by mouth every Monday.      No current facility-administered medications on file prior to visit.     Allergies:   Allergies  Allergen Reactions  . Cephalexin Itching     Physical Exam  Vitals:   08/14/18 0901  BP: (!) 159/83  Pulse: 76  Weight: 120 lb 12.8 oz (54.8 kg)  Height: 5\' 5"  (1.651 m)   Body mass index is 20.1 kg/m. No exam data present  General: Frail pleasant elderly African-American female, seated, in no evident distress Head: head normocephalic and atraumatic.   Neck: supple with no carotid or supraclavicular bruits Cardiovascular: regular rate and rhythm,  no murmurs Musculoskeletal: no deformity Skin:  no rash/petichiae Vascular:  Normal pulses all extremities  Neurologic Exam Mental Status: Awake and fully alert.  Mild dysarthria.  Oriented to place and time. Recent and remote memory intact. Attention span, concentration and fund of knowledge appropriate. Mood and affect appropriate.  Cranial Nerves: Fundoscopic exam reveals sharp disc margins. Pupils equal, briskly reactive to light. Extraocular movements full without nystagmus. Visual fields full to confrontation. Hearing intact. Facial sensation intact. Face, tongue, palate moves normally and symmetrically.  Motor: Normal bulk and tone.  Very mild RUE weakness and 4/5 bilateral lower extremity hip flexor weakness Sensory.: intact to touch , pinprick , position and vibratory sensation.  Coordination: Rapid alternating movements normal in all extremities. Finger-to-nose performed accurately bilaterally.unable to perform heel-to-shin accurately. Gait and Station: Patient currently sitting in wheelchair and as she does not have rolling walker at today's appointment, gait assessment deferred Reflexes: 1+ and symmetric. Toes downgoing.    NIHSS  1 Modified Rankin  3    Diagnostic Data (Labs, Imaging, Testing)  CT HEAD WO CONTRAST 05/20/2018 IMPRESSION: 1. No acute intracranial abnormality or significant interval change. 2. Stable Vance atrophy and white matter disease. This likely reflects the sequela of chronic microvascular ischemia. 3. Asymmetric volume loss and remote ischemia involving the medial right temporal lobe. 4. Chronic left sphenoid sinus opacification. This represents a known meningoencephalocele .  CT ANGIO HEAD W OR WO CONTRAST CT ANGIO NECK W OR WO CONTRAST 05/22/2018 CTA NECK: 1. Less than 50% stenosis bilateral ICA. 2. Chronic RIGHT vertebral artery dissection, occluded distal RIGHT V3 segment. 3. Severe atherosclerosis versus chronic dissection LEFT  vertebral artery with multifocal severe stenosis. CTA HEAD: 1. No emergent large vessel occlusion. Chronically occluded RIGHT V4 segment.  2. Multifocal severe ICA stenosis. Multifocal cerebral artery severe stenosis.  MR BRAIN WO CONTRAST 05/20/2018 IMPRESSION: 1. Multiple small foci of acute ischemia: 1 in the right parietal lobe and 2-3 in the right cerebellum. No acute hemorrhage or mass effect. 2. 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 3. 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 4. Unchanged left sphenoid sinus meningoencephalocele.  ECHOCARDIOGRAM 05/23/2018 Study Conclusions - Procedure narrative: Transthoracic echocardiography. Image   quality was poor. The study was technically difficult, as a   result of poor sound wave transmission. - Left ventricle: The cavity size was normal. Systolic function was   vigorous. The estimated ejection fraction was in the range of 65%   to 70%. There was dynamic obstruction at restin the mid cavity,   with a peak velocity of 290 cm/sec and a peak gradient of 34 mm   Hg. Wall motion was normal; there were no regional wall motion   abnormalities. Doppler parameters are consistent with abnormal   left ventricular relaxation (grade 1 diastolic dysfunction). - Pulmonary arteries: Systolic pressure was mildly increased. PA   peak pressure: 36 mm Hg (S).    ASSESSMENT: Sonya [name] is a 69 y.o. year old female here with 3 punctate right brain infarcts on 05/20/2018 secondary to hypotension in setting of known large vessel disease but embolic source cannot be ruled out. Vascular risk factors include ESRD on HD, HTN, HLD, ICH in 2016, recent infarct 2019, and recent right femur fracture status post mechanical fall status post ORIF on 05/17/2017.  Patient is being seen today for hospital follow-up with continued deficits of dysarthria and bilateral lower extremity hip flexor  weakness but overall has been stable and does endorse improvement.    PLAN:  1. Right brain infarcts: Continue aspirin 325 mg daily and clopidogrel 75 mg daily  and atorvastatin for secondary stroke prevention.  Recommended undergoing 30-day cardiac event monitor as embolic source cannot be ruled out during admission.  Referral placed to cardiology. maintain strict control of hypertension with SBP goal 1 30-1 50, diabetes with hemoglobin A1c goal below 6.5% and cholesterol with LDL cholesterol (bad cholesterol) goal below 70 mg/dL.  I also advised the patient to eat a healthy diet with plenty of whole grains, cereals, fruits and vegetables, exercise regularly with at least 30 minutes of continuous activity daily and maintain ideal body weight. 2. Post stroke deficits: Recommended continuation of therapies at facility along with ensuring home health PT/OT/ST when she was discharged home 3. HTN: Advised to continue current treatment regimen.  Today's BP 159/83.  Advised to continue to monitor at home along with continued follow-up with PCP for management.  Educated patient on ensuring SBP goal 1 30-1 50 4. HLD: Advised to continue current treatment regimen along with continued follow-up with PCP for future prescribing and monitoring of lipid panel     Follow up in 3 months or call earlier if needed   Greater than 50% of time during this 25 minute visit was spent on counseling, explanation of diagnosis of right brain infarcts, reviewing risk factor management of HLD and HTN, planning of further management along with potential future management, and discussion with patient and family answering all questions.    Venancio Poisson, AGNP-BC  [facility] [address] [place], [place] [address]  Phone [phone] Fax [phone] Note: This document was prepared with digital dictation and possible smart phrase technology. Any transcriptional errors that result  from this process  are unintentional.

## 2018-08-12 DIAGNOSIS — N186 End stage renal disease: Secondary | ICD-10-CM | POA: Diagnosis not present

## 2018-08-12 DIAGNOSIS — D509 Iron deficiency anemia, unspecified: Secondary | ICD-10-CM | POA: Diagnosis not present

## 2018-08-12 DIAGNOSIS — D631 Anemia in chronic kidney disease: Secondary | ICD-10-CM | POA: Diagnosis not present

## 2018-08-12 DIAGNOSIS — N2581 Secondary hyperparathyroidism of renal origin: Secondary | ICD-10-CM | POA: Diagnosis not present

## 2018-08-12 DIAGNOSIS — Z992 Dependence on renal dialysis: Secondary | ICD-10-CM | POA: Diagnosis not present

## 2018-08-14 ENCOUNTER — Ambulatory Visit (INDEPENDENT_AMBULATORY_CARE_PROVIDER_SITE_OTHER): Payer: Medicare Other | Admitting: Adult Health

## 2018-08-14 ENCOUNTER — Encounter: Payer: Self-pay | Admitting: Adult Health

## 2018-08-14 ENCOUNTER — Encounter

## 2018-08-14 VITALS — BP 159/83 | HR 76 | Ht 65.0 in | Wt 120.8 lb

## 2018-08-14 DIAGNOSIS — E782 Mixed hyperlipidemia: Secondary | ICD-10-CM | POA: Diagnosis not present

## 2018-08-14 DIAGNOSIS — I63511 Cerebral infarction due to unspecified occlusion or stenosis of right middle cerebral artery: Secondary | ICD-10-CM | POA: Diagnosis not present

## 2018-08-14 DIAGNOSIS — I10 Essential (primary) hypertension: Secondary | ICD-10-CM

## 2018-08-14 NOTE — Patient Instructions (Signed)
Continue aspirin 325 mg daily and clopidogrel 75 mg daily  and lipitor  for secondary stroke prevention  Continue to follow up with PCP regarding cholesterol and blood pressure management   Continue therapies at current facility and ensure home health therapies once you return home  You will undergo 30 cardiac event monitor to rule out atrial fibrillation  Continue to monitor blood pressure at home  Maintain strict control of hypertension with blood pressure top number 130-150, diabetes with hemoglobin A1c goal below 6.5% and cholesterol with LDL cholesterol (bad cholesterol) goal below 70 mg/dL. I also advised the patient to eat a healthy diet with plenty of whole grains, cereals, fruits and vegetables, exercise regularly and maintain ideal body weight.  Followup in the future with me in 3 months or call earlier if needed       Thank you for coming to see [place] at [facility]. I hope we have been able to provide you high quality care today.  You may receive a patient satisfaction survey over the next few weeks. We would appreciate your feedback and comments so that we may continue to improve ourselves and the health of our patients.

## 2018-08-15 DIAGNOSIS — N186 End stage renal disease: Secondary | ICD-10-CM | POA: Diagnosis not present

## 2018-08-15 DIAGNOSIS — Z992 Dependence on renal dialysis: Secondary | ICD-10-CM | POA: Diagnosis not present

## 2018-08-15 DIAGNOSIS — I1311 Hypertensive heart and chronic kidney disease without heart failure, with stage 5 chronic kidney disease, or end stage renal disease: Secondary | ICD-10-CM | POA: Diagnosis not present

## 2018-08-15 DIAGNOSIS — N2581 Secondary hyperparathyroidism of renal origin: Secondary | ICD-10-CM | POA: Diagnosis not present

## 2018-08-15 DIAGNOSIS — S72451D Displaced supracondylar fracture without intracondylar extension of lower end of right femur, subsequent encounter for closed fracture with routine healing: Secondary | ICD-10-CM | POA: Diagnosis not present

## 2018-08-15 DIAGNOSIS — I6340 Cerebral infarction due to embolism of unspecified cerebral artery: Secondary | ICD-10-CM | POA: Diagnosis not present

## 2018-08-15 DIAGNOSIS — D509 Iron deficiency anemia, unspecified: Secondary | ICD-10-CM | POA: Diagnosis not present

## 2018-08-15 DIAGNOSIS — D631 Anemia in chronic kidney disease: Secondary | ICD-10-CM | POA: Diagnosis not present

## 2018-08-16 ENCOUNTER — Telehealth: Payer: Self-pay

## 2018-08-16 NOTE — Telephone Encounter (Signed)
I spoke with daughter , Joanne Chars, explained that [employer] will call her to verify address and mail 30 day event monitor

## 2018-08-17 DIAGNOSIS — E039 Hypothyroidism, unspecified: Secondary | ICD-10-CM | POA: Diagnosis not present

## 2018-08-17 DIAGNOSIS — Z95820 Peripheral vascular angioplasty status with implants and grafts: Secondary | ICD-10-CM | POA: Diagnosis not present

## 2018-08-17 DIAGNOSIS — M6281 Muscle weakness (generalized): Secondary | ICD-10-CM | POA: Diagnosis not present

## 2018-08-17 DIAGNOSIS — D509 Iron deficiency anemia, unspecified: Secondary | ICD-10-CM | POA: Diagnosis not present

## 2018-08-17 DIAGNOSIS — M25819 Other specified joint disorders, unspecified shoulder: Secondary | ICD-10-CM | POA: Diagnosis not present

## 2018-08-17 DIAGNOSIS — K5712 Diverticulitis of small intestine without perforation or abscess without bleeding: Secondary | ICD-10-CM | POA: Diagnosis not present

## 2018-08-17 DIAGNOSIS — N2581 Secondary hyperparathyroidism of renal origin: Secondary | ICD-10-CM | POA: Diagnosis not present

## 2018-08-17 DIAGNOSIS — N186 End stage renal disease: Secondary | ICD-10-CM | POA: Diagnosis not present

## 2018-08-17 DIAGNOSIS — F0280 Dementia in other diseases classified elsewhere without behavioral disturbance: Secondary | ICD-10-CM | POA: Diagnosis not present

## 2018-08-17 DIAGNOSIS — D631 Anemia in chronic kidney disease: Secondary | ICD-10-CM | POA: Diagnosis not present

## 2018-08-17 DIAGNOSIS — Z86718 Personal history of other venous thrombosis and embolism: Secondary | ICD-10-CM | POA: Diagnosis not present

## 2018-08-17 DIAGNOSIS — M109 Gout, unspecified: Secondary | ICD-10-CM | POA: Diagnosis not present

## 2018-08-17 DIAGNOSIS — I679 Cerebrovascular disease, unspecified: Secondary | ICD-10-CM | POA: Diagnosis not present

## 2018-08-17 DIAGNOSIS — Z9049 Acquired absence of other specified parts of digestive tract: Secondary | ICD-10-CM | POA: Diagnosis not present

## 2018-08-17 DIAGNOSIS — K219 Gastro-esophageal reflux disease without esophagitis: Secondary | ICD-10-CM | POA: Diagnosis not present

## 2018-08-17 DIAGNOSIS — Z992 Dependence on renal dialysis: Secondary | ICD-10-CM | POA: Diagnosis not present

## 2018-08-17 DIAGNOSIS — Z8542 Personal history of malignant neoplasm of other parts of uterus: Secondary | ICD-10-CM | POA: Diagnosis not present

## 2018-08-17 DIAGNOSIS — E43 Unspecified severe protein-calorie malnutrition: Secondary | ICD-10-CM | POA: Diagnosis not present

## 2018-08-17 DIAGNOSIS — Z9181 History of falling: Secondary | ICD-10-CM | POA: Diagnosis not present

## 2018-08-17 DIAGNOSIS — I69398 Other sequelae of cerebral infarction: Secondary | ICD-10-CM | POA: Diagnosis not present

## 2018-08-17 DIAGNOSIS — Z90710 Acquired absence of both cervix and uterus: Secondary | ICD-10-CM | POA: Diagnosis not present

## 2018-08-17 DIAGNOSIS — Z98891 History of uterine scar from previous surgery: Secondary | ICD-10-CM | POA: Diagnosis not present

## 2018-08-17 DIAGNOSIS — F419 Anxiety disorder, unspecified: Secondary | ICD-10-CM | POA: Diagnosis not present

## 2018-08-17 DIAGNOSIS — S72351D Displaced comminuted fracture of shaft of right femur, subsequent encounter for closed fracture with routine healing: Secondary | ICD-10-CM | POA: Diagnosis not present

## 2018-08-17 DIAGNOSIS — I1311 Hypertensive heart and chronic kidney disease without heart failure, with stage 5 chronic kidney disease, or end stage renal disease: Secondary | ICD-10-CM | POA: Diagnosis not present

## 2018-08-18 DIAGNOSIS — N2581 Secondary hyperparathyroidism of renal origin: Secondary | ICD-10-CM | POA: Diagnosis not present

## 2018-08-18 DIAGNOSIS — N186 End stage renal disease: Secondary | ICD-10-CM | POA: Diagnosis not present

## 2018-08-18 DIAGNOSIS — D509 Iron deficiency anemia, unspecified: Secondary | ICD-10-CM | POA: Diagnosis not present

## 2018-08-18 DIAGNOSIS — D631 Anemia in chronic kidney disease: Secondary | ICD-10-CM | POA: Diagnosis not present

## 2018-08-18 DIAGNOSIS — Z992 Dependence on renal dialysis: Secondary | ICD-10-CM | POA: Diagnosis not present

## 2018-08-18 NOTE — Progress Notes (Signed)
I agree with the above plan 

## 2018-08-21 DIAGNOSIS — D509 Iron deficiency anemia, unspecified: Secondary | ICD-10-CM | POA: Diagnosis not present

## 2018-08-21 DIAGNOSIS — D631 Anemia in chronic kidney disease: Secondary | ICD-10-CM | POA: Diagnosis not present

## 2018-08-21 DIAGNOSIS — Z992 Dependence on renal dialysis: Secondary | ICD-10-CM | POA: Diagnosis not present

## 2018-08-21 DIAGNOSIS — N2581 Secondary hyperparathyroidism of renal origin: Secondary | ICD-10-CM | POA: Diagnosis not present

## 2018-08-21 DIAGNOSIS — N186 End stage renal disease: Secondary | ICD-10-CM | POA: Diagnosis not present

## 2018-08-22 ENCOUNTER — Encounter (INDEPENDENT_AMBULATORY_CARE_PROVIDER_SITE_OTHER): Payer: Medicare Other

## 2018-08-22 DIAGNOSIS — I498 Other specified cardiac arrhythmias: Secondary | ICD-10-CM | POA: Diagnosis not present

## 2018-08-22 DIAGNOSIS — I1311 Hypertensive heart and chronic kidney disease without heart failure, with stage 5 chronic kidney disease, or end stage renal disease: Secondary | ICD-10-CM | POA: Diagnosis not present

## 2018-08-22 DIAGNOSIS — S72351D Displaced comminuted fracture of shaft of right femur, subsequent encounter for closed fracture with routine healing: Secondary | ICD-10-CM | POA: Diagnosis not present

## 2018-08-22 DIAGNOSIS — M6281 Muscle weakness (generalized): Secondary | ICD-10-CM | POA: Diagnosis not present

## 2018-08-22 DIAGNOSIS — D631 Anemia in chronic kidney disease: Secondary | ICD-10-CM | POA: Diagnosis not present

## 2018-08-22 DIAGNOSIS — I4891 Unspecified atrial fibrillation: Secondary | ICD-10-CM | POA: Diagnosis not present

## 2018-08-22 DIAGNOSIS — N186 End stage renal disease: Secondary | ICD-10-CM | POA: Diagnosis not present

## 2018-08-22 DIAGNOSIS — I69398 Other sequelae of cerebral infarction: Secondary | ICD-10-CM | POA: Diagnosis not present

## 2018-08-23 ENCOUNTER — Other Ambulatory Visit: Payer: Self-pay | Admitting: *Deleted

## 2018-08-23 DIAGNOSIS — D509 Iron deficiency anemia, unspecified: Secondary | ICD-10-CM | POA: Diagnosis not present

## 2018-08-23 DIAGNOSIS — D631 Anemia in chronic kidney disease: Secondary | ICD-10-CM | POA: Diagnosis not present

## 2018-08-23 DIAGNOSIS — T82838A Hemorrhage of vascular prosthetic devices, implants and grafts, initial encounter: Secondary | ICD-10-CM | POA: Diagnosis not present

## 2018-08-23 DIAGNOSIS — N186 End stage renal disease: Secondary | ICD-10-CM | POA: Diagnosis not present

## 2018-08-23 DIAGNOSIS — R58 Hemorrhage, not elsewhere classified: Secondary | ICD-10-CM | POA: Diagnosis not present

## 2018-08-23 DIAGNOSIS — N2581 Secondary hyperparathyroidism of renal origin: Secondary | ICD-10-CM | POA: Diagnosis not present

## 2018-08-23 DIAGNOSIS — Z992 Dependence on renal dialysis: Secondary | ICD-10-CM | POA: Diagnosis not present

## 2018-08-23 DIAGNOSIS — I498 Other specified cardiac arrhythmias: Secondary | ICD-10-CM

## 2018-08-24 DIAGNOSIS — I69398 Other sequelae of cerebral infarction: Secondary | ICD-10-CM | POA: Diagnosis not present

## 2018-08-24 DIAGNOSIS — N186 End stage renal disease: Secondary | ICD-10-CM | POA: Diagnosis not present

## 2018-08-24 DIAGNOSIS — S72351D Displaced comminuted fracture of shaft of right femur, subsequent encounter for closed fracture with routine healing: Secondary | ICD-10-CM | POA: Diagnosis not present

## 2018-08-24 DIAGNOSIS — M6281 Muscle weakness (generalized): Secondary | ICD-10-CM | POA: Diagnosis not present

## 2018-08-24 DIAGNOSIS — D631 Anemia in chronic kidney disease: Secondary | ICD-10-CM | POA: Diagnosis not present

## 2018-08-24 DIAGNOSIS — I1311 Hypertensive heart and chronic kidney disease without heart failure, with stage 5 chronic kidney disease, or end stage renal disease: Secondary | ICD-10-CM | POA: Diagnosis not present

## 2018-08-25 DIAGNOSIS — N186 End stage renal disease: Secondary | ICD-10-CM | POA: Diagnosis not present

## 2018-08-25 DIAGNOSIS — Z992 Dependence on renal dialysis: Secondary | ICD-10-CM | POA: Diagnosis not present

## 2018-08-25 DIAGNOSIS — D631 Anemia in chronic kidney disease: Secondary | ICD-10-CM | POA: Diagnosis not present

## 2018-08-25 DIAGNOSIS — D509 Iron deficiency anemia, unspecified: Secondary | ICD-10-CM | POA: Diagnosis not present

## 2018-08-25 DIAGNOSIS — N2581 Secondary hyperparathyroidism of renal origin: Secondary | ICD-10-CM | POA: Diagnosis not present

## 2018-08-28 DIAGNOSIS — D631 Anemia in chronic kidney disease: Secondary | ICD-10-CM | POA: Diagnosis not present

## 2018-08-28 DIAGNOSIS — N2581 Secondary hyperparathyroidism of renal origin: Secondary | ICD-10-CM | POA: Diagnosis not present

## 2018-08-28 DIAGNOSIS — Z992 Dependence on renal dialysis: Secondary | ICD-10-CM | POA: Diagnosis not present

## 2018-08-28 DIAGNOSIS — D509 Iron deficiency anemia, unspecified: Secondary | ICD-10-CM | POA: Diagnosis not present

## 2018-08-28 DIAGNOSIS — N186 End stage renal disease: Secondary | ICD-10-CM | POA: Diagnosis not present

## 2018-08-29 DIAGNOSIS — I1311 Hypertensive heart and chronic kidney disease without heart failure, with stage 5 chronic kidney disease, or end stage renal disease: Secondary | ICD-10-CM | POA: Diagnosis not present

## 2018-08-29 DIAGNOSIS — M6281 Muscle weakness (generalized): Secondary | ICD-10-CM | POA: Diagnosis not present

## 2018-08-29 DIAGNOSIS — N186 End stage renal disease: Secondary | ICD-10-CM | POA: Diagnosis not present

## 2018-08-29 DIAGNOSIS — I69398 Other sequelae of cerebral infarction: Secondary | ICD-10-CM | POA: Diagnosis not present

## 2018-08-29 DIAGNOSIS — S72351D Displaced comminuted fracture of shaft of right femur, subsequent encounter for closed fracture with routine healing: Secondary | ICD-10-CM | POA: Diagnosis not present

## 2018-08-29 DIAGNOSIS — D631 Anemia in chronic kidney disease: Secondary | ICD-10-CM | POA: Diagnosis not present

## 2018-08-30 DIAGNOSIS — N186 End stage renal disease: Secondary | ICD-10-CM | POA: Diagnosis not present

## 2018-08-30 DIAGNOSIS — D509 Iron deficiency anemia, unspecified: Secondary | ICD-10-CM | POA: Diagnosis not present

## 2018-08-30 DIAGNOSIS — N2581 Secondary hyperparathyroidism of renal origin: Secondary | ICD-10-CM | POA: Diagnosis not present

## 2018-08-30 DIAGNOSIS — Z992 Dependence on renal dialysis: Secondary | ICD-10-CM | POA: Diagnosis not present

## 2018-08-30 DIAGNOSIS — D631 Anemia in chronic kidney disease: Secondary | ICD-10-CM | POA: Diagnosis not present

## 2018-08-31 DIAGNOSIS — Z681 Body mass index (BMI) 19 or less, adult: Secondary | ICD-10-CM | POA: Diagnosis not present

## 2018-08-31 DIAGNOSIS — R5383 Other fatigue: Secondary | ICD-10-CM | POA: Diagnosis not present

## 2018-08-31 DIAGNOSIS — N186 End stage renal disease: Secondary | ICD-10-CM | POA: Diagnosis not present

## 2018-08-31 DIAGNOSIS — D631 Anemia in chronic kidney disease: Secondary | ICD-10-CM | POA: Diagnosis not present

## 2018-08-31 DIAGNOSIS — M6281 Muscle weakness (generalized): Secondary | ICD-10-CM | POA: Diagnosis not present

## 2018-08-31 DIAGNOSIS — I1311 Hypertensive heart and chronic kidney disease without heart failure, with stage 5 chronic kidney disease, or end stage renal disease: Secondary | ICD-10-CM | POA: Diagnosis not present

## 2018-08-31 DIAGNOSIS — J449 Chronic obstructive pulmonary disease, unspecified: Secondary | ICD-10-CM | POA: Diagnosis not present

## 2018-08-31 DIAGNOSIS — I69398 Other sequelae of cerebral infarction: Secondary | ICD-10-CM | POA: Diagnosis not present

## 2018-08-31 DIAGNOSIS — I10 Essential (primary) hypertension: Secondary | ICD-10-CM | POA: Diagnosis not present

## 2018-08-31 DIAGNOSIS — S72351D Displaced comminuted fracture of shaft of right femur, subsequent encounter for closed fracture with routine healing: Secondary | ICD-10-CM | POA: Diagnosis not present

## 2018-09-01 DIAGNOSIS — N186 End stage renal disease: Secondary | ICD-10-CM | POA: Diagnosis not present

## 2018-09-01 DIAGNOSIS — Z992 Dependence on renal dialysis: Secondary | ICD-10-CM | POA: Diagnosis not present

## 2018-09-04 DIAGNOSIS — Z992 Dependence on renal dialysis: Secondary | ICD-10-CM | POA: Diagnosis not present

## 2018-09-04 DIAGNOSIS — N186 End stage renal disease: Secondary | ICD-10-CM | POA: Diagnosis not present

## 2018-09-04 DIAGNOSIS — D509 Iron deficiency anemia, unspecified: Secondary | ICD-10-CM | POA: Diagnosis not present

## 2018-09-04 DIAGNOSIS — D631 Anemia in chronic kidney disease: Secondary | ICD-10-CM | POA: Diagnosis not present

## 2018-09-05 DIAGNOSIS — N186 End stage renal disease: Secondary | ICD-10-CM | POA: Diagnosis not present

## 2018-09-05 DIAGNOSIS — M6281 Muscle weakness (generalized): Secondary | ICD-10-CM | POA: Diagnosis not present

## 2018-09-05 DIAGNOSIS — I69398 Other sequelae of cerebral infarction: Secondary | ICD-10-CM | POA: Diagnosis not present

## 2018-09-05 DIAGNOSIS — I1311 Hypertensive heart and chronic kidney disease without heart failure, with stage 5 chronic kidney disease, or end stage renal disease: Secondary | ICD-10-CM | POA: Diagnosis not present

## 2018-09-05 DIAGNOSIS — S72351D Displaced comminuted fracture of shaft of right femur, subsequent encounter for closed fracture with routine healing: Secondary | ICD-10-CM | POA: Diagnosis not present

## 2018-09-05 DIAGNOSIS — Z111 Encounter for screening for respiratory tuberculosis: Secondary | ICD-10-CM | POA: Diagnosis not present

## 2018-09-05 DIAGNOSIS — D631 Anemia in chronic kidney disease: Secondary | ICD-10-CM | POA: Diagnosis not present

## 2018-09-06 DIAGNOSIS — D509 Iron deficiency anemia, unspecified: Secondary | ICD-10-CM | POA: Diagnosis not present

## 2018-09-06 DIAGNOSIS — D631 Anemia in chronic kidney disease: Secondary | ICD-10-CM | POA: Diagnosis not present

## 2018-09-06 DIAGNOSIS — Z992 Dependence on renal dialysis: Secondary | ICD-10-CM | POA: Diagnosis not present

## 2018-09-06 DIAGNOSIS — N186 End stage renal disease: Secondary | ICD-10-CM | POA: Diagnosis not present

## 2018-09-07 DIAGNOSIS — I69398 Other sequelae of cerebral infarction: Secondary | ICD-10-CM | POA: Diagnosis not present

## 2018-09-07 DIAGNOSIS — D631 Anemia in chronic kidney disease: Secondary | ICD-10-CM | POA: Diagnosis not present

## 2018-09-07 DIAGNOSIS — I1311 Hypertensive heart and chronic kidney disease without heart failure, with stage 5 chronic kidney disease, or end stage renal disease: Secondary | ICD-10-CM | POA: Diagnosis not present

## 2018-09-07 DIAGNOSIS — S72351D Displaced comminuted fracture of shaft of right femur, subsequent encounter for closed fracture with routine healing: Secondary | ICD-10-CM | POA: Diagnosis not present

## 2018-09-07 DIAGNOSIS — M6281 Muscle weakness (generalized): Secondary | ICD-10-CM | POA: Diagnosis not present

## 2018-09-07 DIAGNOSIS — N186 End stage renal disease: Secondary | ICD-10-CM | POA: Diagnosis not present

## 2018-09-08 DIAGNOSIS — Z992 Dependence on renal dialysis: Secondary | ICD-10-CM | POA: Diagnosis not present

## 2018-09-08 DIAGNOSIS — N186 End stage renal disease: Secondary | ICD-10-CM | POA: Diagnosis not present

## 2018-09-08 DIAGNOSIS — D509 Iron deficiency anemia, unspecified: Secondary | ICD-10-CM | POA: Diagnosis not present

## 2018-09-08 DIAGNOSIS — D631 Anemia in chronic kidney disease: Secondary | ICD-10-CM | POA: Diagnosis not present

## 2018-09-11 DIAGNOSIS — D509 Iron deficiency anemia, unspecified: Secondary | ICD-10-CM | POA: Diagnosis not present

## 2018-09-11 DIAGNOSIS — Z992 Dependence on renal dialysis: Secondary | ICD-10-CM | POA: Diagnosis not present

## 2018-09-11 DIAGNOSIS — N186 End stage renal disease: Secondary | ICD-10-CM | POA: Diagnosis not present

## 2018-09-11 DIAGNOSIS — D631 Anemia in chronic kidney disease: Secondary | ICD-10-CM | POA: Diagnosis not present

## 2018-09-12 DIAGNOSIS — Z992 Dependence on renal dialysis: Secondary | ICD-10-CM | POA: Diagnosis not present

## 2018-09-12 DIAGNOSIS — E785 Hyperlipidemia, unspecified: Secondary | ICD-10-CM | POA: Diagnosis not present

## 2018-09-12 DIAGNOSIS — I2510 Atherosclerotic heart disease of native coronary artery without angina pectoris: Secondary | ICD-10-CM | POA: Diagnosis not present

## 2018-09-12 DIAGNOSIS — Z7982 Long term (current) use of aspirin: Secondary | ICD-10-CM | POA: Diagnosis not present

## 2018-09-12 DIAGNOSIS — I959 Hypotension, unspecified: Secondary | ICD-10-CM | POA: Diagnosis not present

## 2018-09-12 DIAGNOSIS — N186 End stage renal disease: Secondary | ICD-10-CM | POA: Diagnosis not present

## 2018-09-12 DIAGNOSIS — M6281 Muscle weakness (generalized): Secondary | ICD-10-CM | POA: Diagnosis not present

## 2018-09-12 DIAGNOSIS — I4891 Unspecified atrial fibrillation: Secondary | ICD-10-CM | POA: Diagnosis not present

## 2018-09-12 DIAGNOSIS — R262 Difficulty in walking, not elsewhere classified: Secondary | ICD-10-CM | POA: Diagnosis not present

## 2018-09-12 DIAGNOSIS — I120 Hypertensive chronic kidney disease with stage 5 chronic kidney disease or end stage renal disease: Secondary | ICD-10-CM | POA: Diagnosis not present

## 2018-09-12 DIAGNOSIS — S72351D Displaced comminuted fracture of shaft of right femur, subsequent encounter for closed fracture with routine healing: Secondary | ICD-10-CM | POA: Diagnosis not present

## 2018-09-12 DIAGNOSIS — E039 Hypothyroidism, unspecified: Secondary | ICD-10-CM | POA: Diagnosis not present

## 2018-09-12 DIAGNOSIS — G459 Transient cerebral ischemic attack, unspecified: Secondary | ICD-10-CM | POA: Diagnosis not present

## 2018-09-12 DIAGNOSIS — Z79899 Other long term (current) drug therapy: Secondary | ICD-10-CM | POA: Diagnosis not present

## 2018-09-12 DIAGNOSIS — M109 Gout, unspecified: Secondary | ICD-10-CM | POA: Diagnosis not present

## 2018-09-12 DIAGNOSIS — I639 Cerebral infarction, unspecified: Secondary | ICD-10-CM | POA: Diagnosis not present

## 2018-09-12 DIAGNOSIS — F419 Anxiety disorder, unspecified: Secondary | ICD-10-CM | POA: Diagnosis not present

## 2018-09-12 DIAGNOSIS — D631 Anemia in chronic kidney disease: Secondary | ICD-10-CM | POA: Diagnosis not present

## 2018-09-12 DIAGNOSIS — Z881 Allergy status to other antibiotic agents status: Secondary | ICD-10-CM | POA: Diagnosis not present

## 2018-09-12 DIAGNOSIS — R531 Weakness: Secondary | ICD-10-CM | POA: Diagnosis not present

## 2018-09-12 DIAGNOSIS — I1311 Hypertensive heart and chronic kidney disease without heart failure, with stage 5 chronic kidney disease, or end stage renal disease: Secondary | ICD-10-CM | POA: Diagnosis not present

## 2018-09-12 DIAGNOSIS — Z7902 Long term (current) use of antithrombotics/antiplatelets: Secondary | ICD-10-CM | POA: Diagnosis not present

## 2018-09-12 DIAGNOSIS — I69398 Other sequelae of cerebral infarction: Secondary | ICD-10-CM | POA: Diagnosis not present

## 2018-09-13 DIAGNOSIS — I4891 Unspecified atrial fibrillation: Secondary | ICD-10-CM | POA: Diagnosis not present

## 2018-09-13 DIAGNOSIS — G459 Transient cerebral ischemic attack, unspecified: Secondary | ICD-10-CM | POA: Diagnosis not present

## 2018-09-13 DIAGNOSIS — I10 Essential (primary) hypertension: Secondary | ICD-10-CM | POA: Diagnosis not present

## 2018-09-13 DIAGNOSIS — I700 Atherosclerosis of aorta: Secondary | ICD-10-CM | POA: Diagnosis not present

## 2018-09-13 DIAGNOSIS — N186 End stage renal disease: Secondary | ICD-10-CM | POA: Diagnosis not present

## 2018-09-13 DIAGNOSIS — R410 Disorientation, unspecified: Secondary | ICD-10-CM | POA: Diagnosis not present

## 2018-09-13 DIAGNOSIS — R000 Tachycardia, unspecified: Secondary | ICD-10-CM | POA: Diagnosis not present

## 2018-09-13 DIAGNOSIS — M109 Gout, unspecified: Secondary | ICD-10-CM | POA: Diagnosis not present

## 2018-09-13 DIAGNOSIS — Z992 Dependence on renal dialysis: Secondary | ICD-10-CM | POA: Diagnosis not present

## 2018-09-13 DIAGNOSIS — I349 Nonrheumatic mitral valve disorder, unspecified: Secondary | ICD-10-CM | POA: Diagnosis not present

## 2018-09-14 DIAGNOSIS — R000 Tachycardia, unspecified: Secondary | ICD-10-CM | POA: Diagnosis not present

## 2018-09-14 DIAGNOSIS — I471 Supraventricular tachycardia: Secondary | ICD-10-CM | POA: Diagnosis not present

## 2018-09-14 DIAGNOSIS — D638 Anemia in other chronic diseases classified elsewhere: Secondary | ICD-10-CM | POA: Diagnosis present

## 2018-09-14 DIAGNOSIS — Z049 Encounter for examination and observation for unspecified reason: Secondary | ICD-10-CM | POA: Diagnosis not present

## 2018-09-14 DIAGNOSIS — E039 Hypothyroidism, unspecified: Secondary | ICD-10-CM | POA: Diagnosis present

## 2018-09-14 DIAGNOSIS — I120 Hypertensive chronic kidney disease with stage 5 chronic kidney disease or end stage renal disease: Secondary | ICD-10-CM | POA: Diagnosis present

## 2018-09-14 DIAGNOSIS — E785 Hyperlipidemia, unspecified: Secondary | ICD-10-CM | POA: Diagnosis present

## 2018-09-14 DIAGNOSIS — I771 Stricture of artery: Secondary | ICD-10-CM | POA: Diagnosis not present

## 2018-09-14 DIAGNOSIS — I739 Peripheral vascular disease, unspecified: Secondary | ICD-10-CM | POA: Diagnosis present

## 2018-09-14 DIAGNOSIS — Z7982 Long term (current) use of aspirin: Secondary | ICD-10-CM | POA: Diagnosis not present

## 2018-09-14 DIAGNOSIS — G459 Transient cerebral ischemic attack, unspecified: Secondary | ICD-10-CM | POA: Diagnosis not present

## 2018-09-14 DIAGNOSIS — F419 Anxiety disorder, unspecified: Secondary | ICD-10-CM | POA: Diagnosis present

## 2018-09-14 DIAGNOSIS — I2510 Atherosclerotic heart disease of native coronary artery without angina pectoris: Secondary | ICD-10-CM | POA: Diagnosis present

## 2018-09-14 DIAGNOSIS — F0150 Vascular dementia without behavioral disturbance: Secondary | ICD-10-CM | POA: Diagnosis present

## 2018-09-14 DIAGNOSIS — D631 Anemia in chronic kidney disease: Secondary | ICD-10-CM | POA: Diagnosis not present

## 2018-09-14 DIAGNOSIS — R9431 Abnormal electrocardiogram [ECG] [EKG]: Secondary | ICD-10-CM | POA: Diagnosis not present

## 2018-09-14 DIAGNOSIS — I059 Rheumatic mitral valve disease, unspecified: Secondary | ICD-10-CM | POA: Diagnosis present

## 2018-09-14 DIAGNOSIS — S59902A Unspecified injury of left elbow, initial encounter: Secondary | ICD-10-CM | POA: Diagnosis not present

## 2018-09-14 DIAGNOSIS — N186 End stage renal disease: Secondary | ICD-10-CM | POA: Diagnosis present

## 2018-09-14 DIAGNOSIS — Z992 Dependence on renal dialysis: Secondary | ICD-10-CM | POA: Diagnosis not present

## 2018-09-14 DIAGNOSIS — I4891 Unspecified atrial fibrillation: Secondary | ICD-10-CM | POA: Diagnosis present

## 2018-09-14 DIAGNOSIS — I472 Ventricular tachycardia: Secondary | ICD-10-CM | POA: Diagnosis present

## 2018-09-14 DIAGNOSIS — M109 Gout, unspecified: Secondary | ICD-10-CM | POA: Diagnosis present

## 2018-09-14 DIAGNOSIS — R531 Weakness: Secondary | ICD-10-CM | POA: Diagnosis not present

## 2018-09-14 DIAGNOSIS — I700 Atherosclerosis of aorta: Secondary | ICD-10-CM | POA: Diagnosis present

## 2018-09-16 DIAGNOSIS — Z9181 History of falling: Secondary | ICD-10-CM | POA: Diagnosis not present

## 2018-09-16 DIAGNOSIS — I69019 Unspecified symptoms and signs involving cognitive functions following nontraumatic subarachnoid hemorrhage: Secondary | ICD-10-CM | POA: Diagnosis present

## 2018-09-16 DIAGNOSIS — I2510 Atherosclerotic heart disease of native coronary artery without angina pectoris: Secondary | ICD-10-CM | POA: Diagnosis present

## 2018-09-16 DIAGNOSIS — I10 Essential (primary) hypertension: Secondary | ICD-10-CM | POA: Diagnosis present

## 2018-09-16 DIAGNOSIS — M109 Gout, unspecified: Secondary | ICD-10-CM | POA: Diagnosis present

## 2018-09-16 DIAGNOSIS — E039 Hypothyroidism, unspecified: Secondary | ICD-10-CM | POA: Diagnosis present

## 2018-09-16 DIAGNOSIS — M6281 Muscle weakness (generalized): Secondary | ICD-10-CM | POA: Diagnosis present

## 2018-09-16 DIAGNOSIS — R41841 Cognitive communication deficit: Secondary | ICD-10-CM | POA: Diagnosis present

## 2018-09-16 DIAGNOSIS — K219 Gastro-esophageal reflux disease without esophagitis: Secondary | ICD-10-CM | POA: Diagnosis present

## 2018-09-16 DIAGNOSIS — Z7401 Bed confinement status: Secondary | ICD-10-CM | POA: Diagnosis not present

## 2018-09-16 DIAGNOSIS — I69391 Dysphagia following cerebral infarction: Secondary | ICD-10-CM | POA: Diagnosis not present

## 2018-09-16 DIAGNOSIS — I120 Hypertensive chronic kidney disease with stage 5 chronic kidney disease or end stage renal disease: Secondary | ICD-10-CM | POA: Diagnosis present

## 2018-09-16 DIAGNOSIS — D631 Anemia in chronic kidney disease: Secondary | ICD-10-CM | POA: Diagnosis present

## 2018-09-16 DIAGNOSIS — I1311 Hypertensive heart and chronic kidney disease without heart failure, with stage 5 chronic kidney disease, or end stage renal disease: Secondary | ICD-10-CM | POA: Diagnosis present

## 2018-09-16 DIAGNOSIS — D649 Anemia, unspecified: Secondary | ICD-10-CM | POA: Diagnosis present

## 2018-09-16 DIAGNOSIS — I4891 Unspecified atrial fibrillation: Secondary | ICD-10-CM | POA: Diagnosis present

## 2018-09-16 DIAGNOSIS — R630 Anorexia: Secondary | ICD-10-CM | POA: Diagnosis present

## 2018-09-16 DIAGNOSIS — F329 Major depressive disorder, single episode, unspecified: Secondary | ICD-10-CM | POA: Diagnosis present

## 2018-09-16 DIAGNOSIS — N2581 Secondary hyperparathyroidism of renal origin: Secondary | ICD-10-CM | POA: Diagnosis present

## 2018-09-16 DIAGNOSIS — N186 End stage renal disease: Secondary | ICD-10-CM | POA: Diagnosis present

## 2018-09-16 DIAGNOSIS — R5381 Other malaise: Secondary | ICD-10-CM | POA: Diagnosis present

## 2018-09-16 DIAGNOSIS — R1310 Dysphagia, unspecified: Secondary | ICD-10-CM | POA: Diagnosis present

## 2018-09-16 DIAGNOSIS — R262 Difficulty in walking, not elsewhere classified: Secondary | ICD-10-CM | POA: Diagnosis present

## 2018-09-16 DIAGNOSIS — G459 Transient cerebral ischemic attack, unspecified: Secondary | ICD-10-CM | POA: Diagnosis present

## 2018-09-16 DIAGNOSIS — R000 Tachycardia, unspecified: Secondary | ICD-10-CM | POA: Diagnosis not present

## 2018-09-16 DIAGNOSIS — I472 Ventricular tachycardia: Secondary | ICD-10-CM | POA: Diagnosis present

## 2018-09-16 DIAGNOSIS — Z992 Dependence on renal dialysis: Secondary | ICD-10-CM | POA: Diagnosis not present

## 2018-09-16 DIAGNOSIS — I63019 Cerebral infarction due to thrombosis of unspecified vertebral artery: Secondary | ICD-10-CM | POA: Diagnosis present

## 2018-09-16 DIAGNOSIS — I69328 Other speech and language deficits following cerebral infarction: Secondary | ICD-10-CM | POA: Diagnosis not present

## 2018-09-16 DIAGNOSIS — I480 Paroxysmal atrial fibrillation: Secondary | ICD-10-CM | POA: Diagnosis present

## 2018-09-16 DIAGNOSIS — R079 Chest pain, unspecified: Secondary | ICD-10-CM | POA: Diagnosis not present

## 2018-09-16 DIAGNOSIS — S72351D Displaced comminuted fracture of shaft of right femur, subsequent encounter for closed fracture with routine healing: Secondary | ICD-10-CM | POA: Diagnosis not present

## 2018-09-16 DIAGNOSIS — I639 Cerebral infarction, unspecified: Secondary | ICD-10-CM | POA: Diagnosis not present

## 2018-09-16 DIAGNOSIS — I739 Peripheral vascular disease, unspecified: Secondary | ICD-10-CM | POA: Diagnosis present

## 2018-09-16 DIAGNOSIS — E46 Unspecified protein-calorie malnutrition: Secondary | ICD-10-CM | POA: Diagnosis present

## 2018-09-16 DIAGNOSIS — Z681 Body mass index (BMI) 19 or less, adult: Secondary | ICD-10-CM | POA: Diagnosis not present

## 2018-09-16 DIAGNOSIS — R195 Other fecal abnormalities: Secondary | ICD-10-CM | POA: Diagnosis not present

## 2018-09-23 DIAGNOSIS — Z992 Dependence on renal dialysis: Secondary | ICD-10-CM | POA: Diagnosis not present

## 2018-09-23 DIAGNOSIS — I69391 Dysphagia following cerebral infarction: Secondary | ICD-10-CM | POA: Diagnosis not present

## 2018-09-23 DIAGNOSIS — R262 Difficulty in walking, not elsewhere classified: Secondary | ICD-10-CM | POA: Diagnosis present

## 2018-09-23 DIAGNOSIS — I480 Paroxysmal atrial fibrillation: Secondary | ICD-10-CM | POA: Diagnosis present

## 2018-09-23 DIAGNOSIS — Z7401 Bed confinement status: Secondary | ICD-10-CM | POA: Diagnosis not present

## 2018-09-23 DIAGNOSIS — I10 Essential (primary) hypertension: Secondary | ICD-10-CM | POA: Diagnosis not present

## 2018-09-23 DIAGNOSIS — Z9181 History of falling: Secondary | ICD-10-CM | POA: Diagnosis not present

## 2018-09-23 DIAGNOSIS — D631 Anemia in chronic kidney disease: Secondary | ICD-10-CM | POA: Diagnosis present

## 2018-09-23 DIAGNOSIS — G459 Transient cerebral ischemic attack, unspecified: Secondary | ICD-10-CM | POA: Diagnosis present

## 2018-09-23 DIAGNOSIS — E039 Hypothyroidism, unspecified: Secondary | ICD-10-CM | POA: Diagnosis present

## 2018-09-23 DIAGNOSIS — R7989 Other specified abnormal findings of blood chemistry: Secondary | ICD-10-CM | POA: Diagnosis not present

## 2018-09-23 DIAGNOSIS — I69328 Other speech and language deficits following cerebral infarction: Secondary | ICD-10-CM | POA: Diagnosis not present

## 2018-09-23 DIAGNOSIS — M109 Gout, unspecified: Secondary | ICD-10-CM | POA: Diagnosis present

## 2018-09-23 DIAGNOSIS — N186 End stage renal disease: Secondary | ICD-10-CM | POA: Diagnosis not present

## 2018-09-23 DIAGNOSIS — L659 Nonscarring hair loss, unspecified: Secondary | ICD-10-CM | POA: Diagnosis not present

## 2018-09-23 DIAGNOSIS — I1311 Hypertensive heart and chronic kidney disease without heart failure, with stage 5 chronic kidney disease, or end stage renal disease: Secondary | ICD-10-CM | POA: Diagnosis present

## 2018-09-23 DIAGNOSIS — E46 Unspecified protein-calorie malnutrition: Secondary | ICD-10-CM | POA: Diagnosis present

## 2018-09-23 DIAGNOSIS — K219 Gastro-esophageal reflux disease without esophagitis: Secondary | ICD-10-CM | POA: Diagnosis present

## 2018-09-23 DIAGNOSIS — D509 Iron deficiency anemia, unspecified: Secondary | ICD-10-CM | POA: Diagnosis not present

## 2018-09-23 DIAGNOSIS — I2510 Atherosclerotic heart disease of native coronary artery without angina pectoris: Secondary | ICD-10-CM | POA: Diagnosis present

## 2018-09-23 DIAGNOSIS — I739 Peripheral vascular disease, unspecified: Secondary | ICD-10-CM | POA: Diagnosis present

## 2018-09-23 DIAGNOSIS — N2581 Secondary hyperparathyroidism of renal origin: Secondary | ICD-10-CM | POA: Diagnosis present

## 2018-09-23 DIAGNOSIS — F419 Anxiety disorder, unspecified: Secondary | ICD-10-CM | POA: Diagnosis not present

## 2018-09-23 DIAGNOSIS — I69019 Unspecified symptoms and signs involving cognitive functions following nontraumatic subarachnoid hemorrhage: Secondary | ICD-10-CM | POA: Diagnosis present

## 2018-09-23 DIAGNOSIS — R630 Anorexia: Secondary | ICD-10-CM | POA: Diagnosis not present

## 2018-09-23 DIAGNOSIS — I4891 Unspecified atrial fibrillation: Secondary | ICD-10-CM | POA: Diagnosis present

## 2018-09-23 DIAGNOSIS — M6281 Muscle weakness (generalized): Secondary | ICD-10-CM | POA: Diagnosis present

## 2018-09-23 DIAGNOSIS — E519 Thiamine deficiency, unspecified: Secondary | ICD-10-CM | POA: Diagnosis not present

## 2018-09-23 DIAGNOSIS — S72351D Displaced comminuted fracture of shaft of right femur, subsequent encounter for closed fracture with routine healing: Secondary | ICD-10-CM | POA: Diagnosis not present

## 2018-09-23 DIAGNOSIS — I63019 Cerebral infarction due to thrombosis of unspecified vertebral artery: Secondary | ICD-10-CM | POA: Diagnosis present

## 2018-09-23 DIAGNOSIS — R41841 Cognitive communication deficit: Secondary | ICD-10-CM | POA: Diagnosis present

## 2018-09-23 DIAGNOSIS — R079 Chest pain, unspecified: Secondary | ICD-10-CM | POA: Diagnosis not present

## 2018-09-23 DIAGNOSIS — R1310 Dysphagia, unspecified: Secondary | ICD-10-CM | POA: Diagnosis present

## 2018-09-23 DIAGNOSIS — R5381 Other malaise: Secondary | ICD-10-CM | POA: Diagnosis not present

## 2018-09-23 DIAGNOSIS — I472 Ventricular tachycardia: Secondary | ICD-10-CM | POA: Diagnosis present

## 2018-09-23 DIAGNOSIS — Z23 Encounter for immunization: Secondary | ICD-10-CM | POA: Diagnosis not present

## 2018-09-25 DIAGNOSIS — Z992 Dependence on renal dialysis: Secondary | ICD-10-CM | POA: Diagnosis not present

## 2018-09-25 DIAGNOSIS — N186 End stage renal disease: Secondary | ICD-10-CM | POA: Diagnosis not present

## 2018-09-25 DIAGNOSIS — D631 Anemia in chronic kidney disease: Secondary | ICD-10-CM | POA: Diagnosis not present

## 2018-09-25 DIAGNOSIS — D509 Iron deficiency anemia, unspecified: Secondary | ICD-10-CM | POA: Diagnosis not present

## 2018-09-26 DIAGNOSIS — I1311 Hypertensive heart and chronic kidney disease without heart failure, with stage 5 chronic kidney disease, or end stage renal disease: Secondary | ICD-10-CM | POA: Diagnosis not present

## 2018-09-26 DIAGNOSIS — I4891 Unspecified atrial fibrillation: Secondary | ICD-10-CM | POA: Diagnosis not present

## 2018-09-26 DIAGNOSIS — R5381 Other malaise: Secondary | ICD-10-CM | POA: Diagnosis not present

## 2018-09-26 DIAGNOSIS — R630 Anorexia: Secondary | ICD-10-CM | POA: Diagnosis not present

## 2018-09-27 DIAGNOSIS — Z992 Dependence on renal dialysis: Secondary | ICD-10-CM | POA: Diagnosis not present

## 2018-09-27 DIAGNOSIS — D509 Iron deficiency anemia, unspecified: Secondary | ICD-10-CM | POA: Diagnosis not present

## 2018-09-27 DIAGNOSIS — D631 Anemia in chronic kidney disease: Secondary | ICD-10-CM | POA: Diagnosis not present

## 2018-09-27 DIAGNOSIS — N186 End stage renal disease: Secondary | ICD-10-CM | POA: Diagnosis not present

## 2018-09-28 ENCOUNTER — Telehealth: Payer: Self-pay | Admitting: *Deleted

## 2018-09-28 DIAGNOSIS — L659 Nonscarring hair loss, unspecified: Secondary | ICD-10-CM | POA: Diagnosis not present

## 2018-09-28 DIAGNOSIS — R630 Anorexia: Secondary | ICD-10-CM | POA: Diagnosis not present

## 2018-09-28 DIAGNOSIS — F419 Anxiety disorder, unspecified: Secondary | ICD-10-CM | POA: Diagnosis not present

## 2018-09-28 NOTE — Telephone Encounter (Signed)
Okay I will review the records when they arrive

## 2018-09-28 NOTE — Telephone Encounter (Signed)
-----   Message from Sonya Poisson, NP sent at 09/27/2018  2:25 PM EST ----- Please advise patient that her recent 30-day cardiac event monitor was negative for atrial fibrillation or any other concerning arrhythmias

## 2018-09-28 NOTE — Telephone Encounter (Signed)
Spoke with pt's dtr. Sonya [name], and per PS, advised that event monitor showed no arrhythmia. Dtr. verbalized understanding of same, sts. that during dialysis at an outside hospital (she believes [facility]), pt. had irreg. htrate. She will request these records be faxed to Dr. Leonie Man at fax# [phone]

## 2018-09-29 DIAGNOSIS — D631 Anemia in chronic kidney disease: Secondary | ICD-10-CM | POA: Diagnosis not present

## 2018-09-29 DIAGNOSIS — D509 Iron deficiency anemia, unspecified: Secondary | ICD-10-CM | POA: Diagnosis not present

## 2018-09-29 DIAGNOSIS — N186 End stage renal disease: Secondary | ICD-10-CM | POA: Diagnosis not present

## 2018-09-29 DIAGNOSIS — Z992 Dependence on renal dialysis: Secondary | ICD-10-CM | POA: Diagnosis not present

## 2018-09-30 DIAGNOSIS — N186 End stage renal disease: Secondary | ICD-10-CM | POA: Diagnosis not present

## 2018-09-30 DIAGNOSIS — Z992 Dependence on renal dialysis: Secondary | ICD-10-CM | POA: Diagnosis not present

## 2018-10-01 DIAGNOSIS — D631 Anemia in chronic kidney disease: Secondary | ICD-10-CM | POA: Diagnosis not present

## 2018-10-01 DIAGNOSIS — N186 End stage renal disease: Secondary | ICD-10-CM | POA: Diagnosis not present

## 2018-10-01 DIAGNOSIS — Z23 Encounter for immunization: Secondary | ICD-10-CM | POA: Diagnosis not present

## 2018-10-01 DIAGNOSIS — D509 Iron deficiency anemia, unspecified: Secondary | ICD-10-CM | POA: Diagnosis not present

## 2018-10-01 DIAGNOSIS — Z992 Dependence on renal dialysis: Secondary | ICD-10-CM | POA: Diagnosis not present

## 2018-10-02 DIAGNOSIS — Z992 Dependence on renal dialysis: Secondary | ICD-10-CM | POA: Diagnosis not present

## 2018-10-02 DIAGNOSIS — D631 Anemia in chronic kidney disease: Secondary | ICD-10-CM | POA: Diagnosis not present

## 2018-10-02 DIAGNOSIS — L659 Nonscarring hair loss, unspecified: Secondary | ICD-10-CM | POA: Diagnosis not present

## 2018-10-02 DIAGNOSIS — R7989 Other specified abnormal findings of blood chemistry: Secondary | ICD-10-CM | POA: Diagnosis not present

## 2018-10-02 DIAGNOSIS — Z23 Encounter for immunization: Secondary | ICD-10-CM | POA: Diagnosis not present

## 2018-10-02 DIAGNOSIS — R630 Anorexia: Secondary | ICD-10-CM | POA: Diagnosis not present

## 2018-10-02 DIAGNOSIS — D509 Iron deficiency anemia, unspecified: Secondary | ICD-10-CM | POA: Diagnosis not present

## 2018-10-02 DIAGNOSIS — E039 Hypothyroidism, unspecified: Secondary | ICD-10-CM | POA: Diagnosis not present

## 2018-10-02 DIAGNOSIS — N186 End stage renal disease: Secondary | ICD-10-CM | POA: Diagnosis not present

## 2018-10-04 DIAGNOSIS — Z23 Encounter for immunization: Secondary | ICD-10-CM | POA: Diagnosis not present

## 2018-10-04 DIAGNOSIS — D509 Iron deficiency anemia, unspecified: Secondary | ICD-10-CM | POA: Diagnosis not present

## 2018-10-04 DIAGNOSIS — N186 End stage renal disease: Secondary | ICD-10-CM | POA: Diagnosis not present

## 2018-10-04 DIAGNOSIS — Z992 Dependence on renal dialysis: Secondary | ICD-10-CM | POA: Diagnosis not present

## 2018-10-04 DIAGNOSIS — D631 Anemia in chronic kidney disease: Secondary | ICD-10-CM | POA: Diagnosis not present

## 2018-10-05 DIAGNOSIS — E519 Thiamine deficiency, unspecified: Secondary | ICD-10-CM | POA: Diagnosis not present

## 2018-10-05 DIAGNOSIS — K219 Gastro-esophageal reflux disease without esophagitis: Secondary | ICD-10-CM | POA: Diagnosis not present

## 2018-10-06 DIAGNOSIS — Z992 Dependence on renal dialysis: Secondary | ICD-10-CM | POA: Diagnosis not present

## 2018-10-06 DIAGNOSIS — D509 Iron deficiency anemia, unspecified: Secondary | ICD-10-CM | POA: Diagnosis not present

## 2018-10-06 DIAGNOSIS — N186 End stage renal disease: Secondary | ICD-10-CM | POA: Diagnosis not present

## 2018-10-06 DIAGNOSIS — Z23 Encounter for immunization: Secondary | ICD-10-CM | POA: Diagnosis not present

## 2018-10-06 DIAGNOSIS — D631 Anemia in chronic kidney disease: Secondary | ICD-10-CM | POA: Diagnosis not present

## 2018-10-09 DIAGNOSIS — Z992 Dependence on renal dialysis: Secondary | ICD-10-CM | POA: Diagnosis not present

## 2018-10-09 DIAGNOSIS — K219 Gastro-esophageal reflux disease without esophagitis: Secondary | ICD-10-CM | POA: Diagnosis not present

## 2018-10-09 DIAGNOSIS — D631 Anemia in chronic kidney disease: Secondary | ICD-10-CM | POA: Diagnosis not present

## 2018-10-09 DIAGNOSIS — Z23 Encounter for immunization: Secondary | ICD-10-CM | POA: Diagnosis not present

## 2018-10-09 DIAGNOSIS — R630 Anorexia: Secondary | ICD-10-CM | POA: Diagnosis not present

## 2018-10-09 DIAGNOSIS — N186 End stage renal disease: Secondary | ICD-10-CM | POA: Diagnosis not present

## 2018-10-09 DIAGNOSIS — D509 Iron deficiency anemia, unspecified: Secondary | ICD-10-CM | POA: Diagnosis not present

## 2018-10-10 ENCOUNTER — Inpatient Hospital Stay (HOSPITAL_COMMUNITY): Payer: Medicare Other | Attending: Internal Medicine | Admitting: Internal Medicine

## 2018-10-10 ENCOUNTER — Encounter (HOSPITAL_COMMUNITY): Payer: Self-pay | Admitting: Internal Medicine

## 2018-10-10 ENCOUNTER — Other Ambulatory Visit: Payer: Self-pay

## 2018-10-10 VITALS — BP 131/70 | HR 80 | Temp 98.3°F | Resp 16 | Ht 64.0 in | Wt 106.0 lb

## 2018-10-10 DIAGNOSIS — N186 End stage renal disease: Secondary | ICD-10-CM | POA: Insufficient documentation

## 2018-10-10 DIAGNOSIS — I69391 Dysphagia following cerebral infarction: Secondary | ICD-10-CM | POA: Diagnosis not present

## 2018-10-10 DIAGNOSIS — D508 Other iron deficiency anemias: Secondary | ICD-10-CM

## 2018-10-10 DIAGNOSIS — M6281 Muscle weakness (generalized): Secondary | ICD-10-CM | POA: Diagnosis not present

## 2018-10-10 DIAGNOSIS — D631 Anemia in chronic kidney disease: Secondary | ICD-10-CM | POA: Insufficient documentation

## 2018-10-10 DIAGNOSIS — I739 Peripheral vascular disease, unspecified: Secondary | ICD-10-CM | POA: Insufficient documentation

## 2018-10-10 DIAGNOSIS — I69328 Other speech and language deficits following cerebral infarction: Secondary | ICD-10-CM | POA: Diagnosis not present

## 2018-10-10 DIAGNOSIS — K219 Gastro-esophageal reflux disease without esophagitis: Secondary | ICD-10-CM

## 2018-10-10 DIAGNOSIS — Z992 Dependence on renal dialysis: Secondary | ICD-10-CM | POA: Diagnosis not present

## 2018-10-10 DIAGNOSIS — Z79899 Other long term (current) drug therapy: Secondary | ICD-10-CM | POA: Diagnosis not present

## 2018-10-10 DIAGNOSIS — M25559 Pain in unspecified hip: Secondary | ICD-10-CM | POA: Insufficient documentation

## 2018-10-10 DIAGNOSIS — I120 Hypertensive chronic kidney disease with stage 5 chronic kidney disease or end stage renal disease: Secondary | ICD-10-CM | POA: Diagnosis not present

## 2018-10-10 DIAGNOSIS — I4891 Unspecified atrial fibrillation: Secondary | ICD-10-CM | POA: Diagnosis not present

## 2018-10-10 DIAGNOSIS — Z7982 Long term (current) use of aspirin: Secondary | ICD-10-CM | POA: Diagnosis not present

## 2018-10-10 DIAGNOSIS — R899 Unspecified abnormal finding in specimens from other organs, systems and tissues: Secondary | ICD-10-CM

## 2018-10-10 DIAGNOSIS — E785 Hyperlipidemia, unspecified: Secondary | ICD-10-CM | POA: Diagnosis not present

## 2018-10-10 DIAGNOSIS — G459 Transient cerebral ischemic attack, unspecified: Secondary | ICD-10-CM | POA: Diagnosis not present

## 2018-10-10 DIAGNOSIS — E039 Hypothyroidism, unspecified: Secondary | ICD-10-CM

## 2018-10-10 DIAGNOSIS — Z5181 Encounter for therapeutic drug level monitoring: Secondary | ICD-10-CM | POA: Diagnosis not present

## 2018-10-10 DIAGNOSIS — I472 Ventricular tachycardia: Secondary | ICD-10-CM | POA: Diagnosis not present

## 2018-10-10 DIAGNOSIS — R41841 Cognitive communication deficit: Secondary | ICD-10-CM | POA: Diagnosis not present

## 2018-10-10 NOTE — Progress Notes (Signed)
Referring Physician:  Cletus Gash, NP  Diagnosis Other iron deficiency anemia  Pain in joint involving pelvic region and thigh, unspecified laterality - Plan: CBC with Differential, Comprehensive metabolic panel, Lactate dehydrogenase, Transferrin Saturation, Ferritin, Iron and TIBC, Rheumatoid factor, Protein electrophoresis, serum, Sedimentation rate, C-reactive protein, Hemochromatosis DNA-PCR(c282y,h63d), Hemochromatosis DNA-PCR(c282y,h63d), C-reactive protein, Sedimentation rate, Protein electrophoresis, serum, Rheumatoid factor, Iron and TIBC, Ferritin, Transferrin Saturation, Lactate dehydrogenase, Comprehensive metabolic panel, CBC with Differential  Abnormal laboratory test - Plan: CBC with Differential, Comprehensive metabolic panel, Lactate dehydrogenase, Transferrin Saturation, Ferritin, Iron and TIBC, Rheumatoid factor, Protein electrophoresis, serum, Sedimentation rate, C-reactive protein, Hemochromatosis DNA-PCR(c282y,h63d), Hemochromatosis DNA-PCR(c282y,h63d), C-reactive protein, Sedimentation rate, Protein electrophoresis, serum, Rheumatoid factor, Iron and TIBC, Ferritin, Transferrin Saturation, Lactate dehydrogenase, Comprehensive metabolic panel, CBC with Differential  Staging Cancer Staging No matching staging information was found for the patient.  Assessment and Plan:  1.  Anemia and reported elevated ferritin.  68 year old female referred for evaluation due to anemia and elevated ferritin.  Pt is a dialysis pt.  She had labs done 06/06/2018 that showed WBC 8.2 HB 9.1 plts 452,000.  Chemistries WNL with K+ 4.7 normal LFTs.  Pt denies any blood in stool or urine.  Pt had colonoscopy done 09/2013 that showed ulcer in descending colon, 1 polyp, diverticulosis and hemorrhoids.  Pathology from polyp returned as tubular adenoma with no dysplasia or malignancy seen.  Pt has reported elevated ferritin > 1000.  Pt seen today for consultation due to anemia and reported elevated ferritin.   She is accompanied by facility caretaker.  Pt is poor historian and much of information obtained from chart and caretaker.    I am requesting labs be drawn on dialysis and will check CBC with Differential, Comprehensive metabolic panel, Lactate dehydrogenase, Transferrin Saturation, Ferritin, Iron and TIBC, Rheumatoid factor, Protein electrophoresis, serum, Sedimentation rate, C-reactive protein, Hemochromatosis DNA-PCR(c282y,h63d).  I suspect elevated ferritin due to CKD but will check hemochromatosis gene.  Pt likely has anemia of chronic disease and pending labs results should discuss with nephrology if they will recommend EPO therapy.  Pt will RTC to go over results.    2.  Chronic Kidney disease.  Will check SPEP.  Follow-up with nephrology as recommended.   3.  Reported elevated ferritin.  Will check iron studies.  Suspect due to dialysis.  Will check hemochromatosis gene.  Pt will follow-up in 3-4 weeks to go over results.    4.  HTN.  BP is 131/70.  Follow-up with PCP for monitoring.    5.  Health maintenance.  Pt had colonoscopy done 09/2013 that showed ulcer in descending colon, 1 polyp, diverticulosis and hemorrhoids.  Pathology from polyp returned as tubular adenoma with no dysplasia or malignancy seen.  Follow-up with GI as recommended.  Mammogram evaluation per PCP or facility.    40 minutes spent with more than 50% spent in review of records, counseling and coordination of care.    HPI:  68 year old female referred for evaluation due to anemia and elevated ferritin.  Pt is a dialysis pt.  She had labs done 06/06/2018 that showed WBC 8.2 HB 9.1 plts 452,000.  Chemistries WNL with K+ 4.7 normal LFTs.  Pt denies any blood in stool or urine.  Pt had colonoscopy done 09/2013 that showed ulcer in descending colon, 1 polyp, diverticulosis and hemorrhoids.  Pathology from polyp returned as tubular adenoma with no dysplasia or malignancy seen.  Pt has reported elevated ferritin > 1000.  Pt seen  today  for consultation due to anemia and reported elevated ferritin.  She is accompanied by facility caretaker.    Problem List Patient Active Problem List   Diagnosis Date Noted  . 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 Fracture of condyle of right femur, closed, initial encounter ([place]) [S72.411A] 05/16/2018  . Closed fracture of right femur, unspecified fracture morphology, initial encounter ([place]) [S72.91XA] 05/16/2018  . Closed displaced comminuted fracture of shaft of right femur ([place]) [S72.351A]   . PAD (peripheral artery disease) ([place]) [I73.9] 03/28/2018  . 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 End stage renal failure on dialysis ([place]) [N18.6, Z99.2]   . Altered mental state [R41.82] 09/28/2017  . Acute ischemic stroke ([place]) [I63.9] 09/28/2017  . Acute metabolic encephalopathy [H63.14] 09/28/2017  . Hyperlipidemia [E78.5] 09/27/2017  . Altered mental status [R41.82] 09/26/2017  . Lactic acidosis [E87.2] 09/26/2017  . Anxiety state [F41.1] 02/03/2017  . Malnutrition of moderate degree [E44.0] 01/19/2017  . Hypoglycemia [E16.2] 01/18/2017  . 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 ESRD (end stage renal disease) ([place]) [N18.6]   . Abnormal weight loss [R63.4] 12/01/2015  . Pulmonary edema [J81.1] 08/19/2015  . CAP (community acquired pneumonia) [J18.9] 08/19/2015  . Hypothyroidism, adult [E03.9] 06/14/2015  . HA (headache) [R51] 06/13/2015  . HTN (hypertension), malignant [I10] 06/13/2015  . Elective surgery [Z41.9]   . Fracture of femoral neck, left, closed ([place]) [S72.002A] 05/30/2015  . Hip fracture ([place]) [S72.009A] 05/30/2015  . Closed fracture of neck of left femur ([place]) [S72.002A]   . Gastroesophageal reflux disease with stricture [K21.9, K22.2] 05/03/2015  . UTI (lower urinary tract infection) [N39.0] 04/14/2015  . Orthostatic hypotension [I95.1] 11/27/2014  . 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 Acute encephalopathy [G93.40]   . Confusion [R41.0]   . Protein-calorie malnutrition, severe ([place]) [E43] 10/15/2014  . Dysphagia [R13.10] 10/15/2014  . Intracerebral bleed ([place]) [I61.9] 10/14/2014  . End stage renal disease ([place]) [N18.6] 08/31/2013  . Symptomatic anemia [D64.9] 08/18/2013  . Hyperparathyroidism due to renal insufficiency ([place]) [N25.81] 07/09/2013  . Constipation [K59.00] 06/21/2013  . Gastritis [K29.70] 06/21/2013  . Chronic kidney disease [N18.9] 06/20/2013  . Anemia in chronic kidney disease [N18.9, D63.1] 06/08/2013  . Gout [M10.9] 06/08/2013  . Other dysphagia [R13.19] 06/07/2013  . Essential hypertension [I10] 04/08/2011  . Colon cancer screening [Z12.11] 04/08/2011    Past Medical History Past Medical History:  Diagnosis Date  . Anemia   . Anxiety   . CKD (chronic kidney disease)   . Diverticulitis   . Dysphagia   . ESRD (end stage renal disease) ([place])    On HD  . Fracture of femoral neck, left, closed ([place]) 05/30/2015  . Gastritis 06/2013 & 05/2015  . Gastroesophageal reflux disease with stricture 05/2015  . GERD (gastroesophageal reflux disease)   . HTN (hypertension)   . Hyperparathyroidism due to renal insufficiency ([place])   . Hypertensive urgency 06/2013 & 04/2015  . Hypothyroidism   . ICH (intracerebral hemorrhage) ([place]) 10/2014  . Meningocele ([place])   . Protein calorie malnutrition ([place])   . Stroke ([place])   . Uterine cancer ([place]) 2012  . Vertical diplopia   . Vertigo     Past Surgical History Past Surgical History:  Procedure Laterality Date  . ABDOMINAL AORTOGRAM N/A 04/05/2018   Procedure: ABDOMINAL AORTOGRAM;  Surgeon: Marty Heck, MD;  Location: [facility];  Service: Cardiovascular;  Laterality: N/A;  . ABDOMINAL HYSTERECTOMY    . APPENDECTOMY    .  BASCILIC VEIN TRANSPOSITION Right 09/17/2013   Procedure: BRACHIAL VEIN TRANSPOSITION;  Surgeon: Conrad [place], MD;  Location: [facility];  Service: Vascular;   Laterality: Right;  . BASCILIC VEIN TRANSPOSITION Right 10/29/2013   Procedure: RIGHT SECOND STAGE BRACHIAL VEIN TRANSPOSITION;  Surgeon: Conrad [place], MD;  Location: [facility];  Service: Vascular;  Laterality: Right;  . CESAREAN SECTION    . CHOLECYSTECTOMY    . COLONOSCOPY  2000   TICS, IH  . COLONOSCOPY  2003 NUR BRBPR D50 V6   DC/[facility] TICS, IH  . COLONOSCOPY N/A 09/04/2013   SLF: Small ulcer in the descending colon, one colon polyp (tubular adenoma), large internal hemorrhoids, moderate diverticulosis. next tcs 10 years.  . ESOPHAGOGASTRODUODENOSCOPY N/A 05/23/2015   HWE:XHBZ non-erosive gastritis/stricture at the gastroesophageal junction  . ESOPHAGOGASTRODUODENOSCOPY (EGD) WITH ESOPHAGEAL DILATION N/A 06/08/2013   Dr. Fields:Stricture at the gastroesophageal junction/MILD NON-erosive gastritis (inflammation) was found in the gastric antrum; multiple biopsies/NO SOURCE FOR ANEMIA IDETIFIED-MOST LIKELY DUE TO ANEMIA OF CHRONIC DISEASE  . FLEXIBLE SIGMOIDOSCOPY N/A 01/02/2016   Dr. [place] Alar: external hemorrhoids, diverticulosis, internal hemorrhoids, proximal rectum normal  . GIVENS CAPSULE STUDY N/A 12/03/2013   SLF: occasional gastric erosion. small bowel normal  . HIP PINNING,CANNULATED Left 05/31/2015   Procedure: CANNULATED HIP PINNING;  Surgeon: Leandrew Koyanagi, MD;  Location: [facility];  Service: Orthopedics;  Laterality: Left;  . INSERTION OF DIALYSIS CATHETER Left 09/17/2013   Procedure: INSERTION OF DIALYSIS CATHETER;  Surgeon: Conrad [place], MD;  Location: [facility];  Service: Vascular;  Laterality: Left;  . LAPAROSCOPIC TOTAL HYSTERECTOMY    . LOWER EXTREMITY ANGIOGRAPHY Bilateral 04/05/2018   Procedure: Lower Extremity Angiography;  Surgeon: Marty Heck, MD;  Location: [facility];  Service: Cardiovascular;  Laterality: Bilateral;  . NASAL SEPTUM SURGERY    . ORIF FEMUR FRACTURE Right 05/17/2018   Procedure: OPEN REDUCTION INTERNAL FIXATION (ORIF) DISTAL FEMUR FRACTURE;  Surgeon: Shona Needles, MD;  Location: [facility];  Service: Orthopedics;  Laterality: Right;  . PERIPHERAL VASCULAR ATHERECTOMY Right 04/05/2018   Procedure: PERIPHERAL VASCULAR ATHERECTOMY;  Surgeon: Marty Heck, MD;  Location: [facility];  Service: Cardiovascular;  Laterality: Right;  Peroneal   . PERIPHERAL VASCULAR BALLOON ANGIOPLASTY Right 04/05/2018   Procedure: PERIPHERAL VASCULAR BALLOON ANGIOPLASTY;  Surgeon: Marty Heck, MD;  Location: [facility];  Service: Cardiovascular;  Laterality: Right;  Posterior tibial  . SAVORY DILATION N/A 05/23/2015   Procedure: SAVORY DILATION;  Surgeon: Danie Binder, MD;  [facility];  Service: Endoscopy;  Laterality: N/A;  . THROMBECTOMY AND REVISION OF ARTERIOVENTOUS (AV) GORETEX  GRAFT Right 07/06/2017   Procedure: THROMBECTOMY AND REVISION OF ARTERIOVENOUS (AV) GORETEX  GRAFT RIGHT UPPER ARM;  Surgeon: Rosetta Posner, MD;  Location: [facility];  Service: Vascular;  Laterality: Right;    Family History Family History  Problem Relation Age of Onset  . Cancer Mother   . Diabetes Mother   . Hypertension Mother   . Diabetes Father   . Hypertension Father   . Hypertension Sister   . Colon cancer Neg Hx      Social History  reports that she has never smoked. She has never used smokeless tobacco. She reports that she does not drink alcohol or use drugs.  Medications  Current Outpatient Medications:  .  allopurinol (ZYLOPRIM) 100 MG tablet, Take 100 mg by mouth every Wednesday. , Disp: , Rfl:  .  amiodarone ([place])  400 MG tablet, Take by mouth., Disp: , Rfl:  .  aspirin EC 81 MG EC tablet, Take 1 tablet (81 mg total) by mouth daily., Disp: 30 tablet, Rfl: 1 .  calcium acetate (PHOSLO) 667 MG capsule, Take 1 capsule (667 mg total) by mouth 3 (three) times daily with meals., Disp: 90 capsule, Rfl: 0 .  cinacalcet (SENSIPAR) 30 MG tablet, Take 30 mg by mouth daily., Disp: , Rfl:  .  famotidine (PEPCID) 20 MG tablet, Take 20 mg by  mouth at bedtime., Disp: , Rfl:  .  folic acid (FOLVITE) 1 MG tablet, Take 1 mg by mouth 2 (two) times daily. , Disp: , Rfl:  .  levothyroxine (SYNTHROID, LEVOTHROID) 50 MCG tablet, Take 1 tablet (50 mcg total) by mouth daily. (Patient taking differently: Take 50 mcg by mouth daily before breakfast. ), Disp: 30 tablet, Rfl: 0 .  LORazepam (ATIVAN) 0.5 MG tablet, Take 0.5 mg by mouth daily., Disp: , Rfl:  .  mirtazapine (REMERON) 7.5 MG tablet, Take 7.5 mg by mouth at bedtime., Disp: , Rfl:  .  ondansetron (ZOFRAN) 4 MG tablet, Take 4 mg by mouth every 6 (six) hours as needed for nausea or vomiting., Disp: , Rfl:  .  pentoxifylline (TRENTAL) 400 MG CR tablet, Take by mouth., Disp: , Rfl:  .  rosuvastatin (CRESTOR) 5 MG tablet, , Disp: , Rfl:  .  thiamine (VITAMIN B-1) 100 MG tablet, Take 100 mg by mouth daily., Disp: , Rfl:   Allergies Cephalexin  Review of Systems Review of Systems - Oncology ROS negative   Physical Exam  Vitals Wt Readings from Last 3 Encounters:  10/10/18 106 lb (48.1 kg)  08/14/18 120 lb 12.8 oz (54.8 kg)  05/22/18 138 lb 3.7 oz (62.7 kg)   Temp Readings from Last 3 Encounters:  10/10/18 98.3 F (36.8 C) (Oral)  05/24/18 99.1 F (37.3 C) (Oral)  04/05/18 98.2 F (36.8 C) (Oral)   BP Readings from Last 3 Encounters:  10/10/18 131/70  08/14/18 (!) 159/83  05/24/18 (!) 159/73   Pulse Readings from Last 3 Encounters:  10/10/18 80  08/14/18 76  05/24/18 92   Constitutional: Well-developed, well-nourished, and in no distress.  Seated in wheelchair.   HENT: Head: Normocephalic and atraumatic.  Mouth/Throat: No oropharyngeal exudate. Mucosa moist. Eyes: Pupils are equal, round, and reactive to light. Conjunctivae are normal. No scleral icterus.  Neck: Normal range of motion. Neck supple. No JVD present.  Cardiovascular: Normal rate, regular rhythm and normal heart sounds.  Exam reveals no gallop and no friction rub.   No murmur heard. Pulmonary/Chest:  Effort normal and breath sounds normal. No respiratory distress. No wheezes.No rales.  Abdominal: Soft. Bowel sounds are normal. No distension. There is no tenderness. There is no guarding.  Musculoskeletal: No edema or tenderness. Dialysis catheter in right arm.   Lymphadenopathy: No cervical,axillary or supraclavicular adenopathy.  Neurological: Alert and oriented to person, place, and time. No cranial nerve deficit.  Skin: Skin is warm and dry. No rash noted. No erythema. No pallor.  Psychiatric: Affect and judgment normal.   Labs No visits with results within 3 Day(s) from this visit.  Latest known visit with results is:  No results displayed because visit has over 200 results.       Pathology Orders Placed This Encounter  Procedures  . CBC with Differential    Standing Status:   Future    Number of Occurrences:   1    Standing  Expiration Date:   10/10/2019  . Comprehensive metabolic panel    Standing Status:   Future    Number of Occurrences:   1    Standing Expiration Date:   10/10/2019  . Lactate dehydrogenase    Standing Status:   Future    Number of Occurrences:   1    Standing Expiration Date:   10/10/2019  . Transferrin Saturation    Standing Status:   Future    Number of Occurrences:   1    Standing Expiration Date:   10/10/2019  . Ferritin    Standing Status:   Future    Number of Occurrences:   1    Standing Expiration Date:   10/10/2019  . Iron and TIBC    Standing Status:   Future    Number of Occurrences:   1    Standing Expiration Date:   10/10/2019  . Rheumatoid factor    Standing Status:   Future    Number of Occurrences:   1    Standing Expiration Date:   10/10/2019  . Protein electrophoresis, serum    Standing Status:   Future    Number of Occurrences:   1    Standing Expiration Date:   10/10/2019  . Sedimentation rate    Standing Status:   Future    Number of Occurrences:   1    Standing Expiration Date:   10/10/2019  . C-reactive protein     Standing Status:   Future    Number of Occurrences:   1    Standing Expiration Date:   10/10/2019  . Hemochromatosis DNA-PCR(c282y,h63d)    Standing Status:   Future    Number of Occurrences:   1    Standing Expiration Date:   10/10/2019       Zoila Shutter MD

## 2018-10-11 DIAGNOSIS — I4891 Unspecified atrial fibrillation: Secondary | ICD-10-CM | POA: Diagnosis not present

## 2018-10-11 DIAGNOSIS — Z23 Encounter for immunization: Secondary | ICD-10-CM | POA: Diagnosis not present

## 2018-10-11 DIAGNOSIS — Z992 Dependence on renal dialysis: Secondary | ICD-10-CM | POA: Diagnosis not present

## 2018-10-11 DIAGNOSIS — D631 Anemia in chronic kidney disease: Secondary | ICD-10-CM | POA: Diagnosis not present

## 2018-10-11 DIAGNOSIS — E039 Hypothyroidism, unspecified: Secondary | ICD-10-CM | POA: Diagnosis not present

## 2018-10-11 DIAGNOSIS — E785 Hyperlipidemia, unspecified: Secondary | ICD-10-CM | POA: Diagnosis not present

## 2018-10-11 DIAGNOSIS — M6281 Muscle weakness (generalized): Secondary | ICD-10-CM | POA: Diagnosis not present

## 2018-10-11 DIAGNOSIS — G459 Transient cerebral ischemic attack, unspecified: Secondary | ICD-10-CM | POA: Diagnosis not present

## 2018-10-11 DIAGNOSIS — R41841 Cognitive communication deficit: Secondary | ICD-10-CM | POA: Diagnosis not present

## 2018-10-11 DIAGNOSIS — D509 Iron deficiency anemia, unspecified: Secondary | ICD-10-CM | POA: Diagnosis not present

## 2018-10-11 DIAGNOSIS — I679 Cerebrovascular disease, unspecified: Secondary | ICD-10-CM | POA: Diagnosis not present

## 2018-10-11 DIAGNOSIS — I69328 Other speech and language deficits following cerebral infarction: Secondary | ICD-10-CM | POA: Diagnosis not present

## 2018-10-11 DIAGNOSIS — I472 Ventricular tachycardia: Secondary | ICD-10-CM | POA: Diagnosis not present

## 2018-10-11 DIAGNOSIS — N186 End stage renal disease: Secondary | ICD-10-CM | POA: Diagnosis not present

## 2018-10-12 DIAGNOSIS — K219 Gastro-esophageal reflux disease without esophagitis: Secondary | ICD-10-CM | POA: Diagnosis not present

## 2018-10-12 DIAGNOSIS — I472 Ventricular tachycardia: Secondary | ICD-10-CM | POA: Diagnosis not present

## 2018-10-12 DIAGNOSIS — R41841 Cognitive communication deficit: Secondary | ICD-10-CM | POA: Diagnosis not present

## 2018-10-12 DIAGNOSIS — I69328 Other speech and language deficits following cerebral infarction: Secondary | ICD-10-CM | POA: Diagnosis not present

## 2018-10-12 DIAGNOSIS — M6281 Muscle weakness (generalized): Secondary | ICD-10-CM | POA: Diagnosis not present

## 2018-10-12 DIAGNOSIS — I4891 Unspecified atrial fibrillation: Secondary | ICD-10-CM | POA: Diagnosis not present

## 2018-10-12 DIAGNOSIS — R630 Anorexia: Secondary | ICD-10-CM | POA: Diagnosis not present

## 2018-10-12 DIAGNOSIS — R5381 Other malaise: Secondary | ICD-10-CM | POA: Diagnosis not present

## 2018-10-12 DIAGNOSIS — G459 Transient cerebral ischemic attack, unspecified: Secondary | ICD-10-CM | POA: Diagnosis not present

## 2018-10-13 DIAGNOSIS — I4891 Unspecified atrial fibrillation: Secondary | ICD-10-CM | POA: Diagnosis not present

## 2018-10-13 DIAGNOSIS — D509 Iron deficiency anemia, unspecified: Secondary | ICD-10-CM | POA: Diagnosis not present

## 2018-10-13 DIAGNOSIS — D631 Anemia in chronic kidney disease: Secondary | ICD-10-CM | POA: Diagnosis not present

## 2018-10-13 DIAGNOSIS — M6281 Muscle weakness (generalized): Secondary | ICD-10-CM | POA: Diagnosis not present

## 2018-10-13 DIAGNOSIS — I69328 Other speech and language deficits following cerebral infarction: Secondary | ICD-10-CM | POA: Diagnosis not present

## 2018-10-13 DIAGNOSIS — I472 Ventricular tachycardia: Secondary | ICD-10-CM | POA: Diagnosis not present

## 2018-10-13 DIAGNOSIS — Z23 Encounter for immunization: Secondary | ICD-10-CM | POA: Diagnosis not present

## 2018-10-13 DIAGNOSIS — Z992 Dependence on renal dialysis: Secondary | ICD-10-CM | POA: Diagnosis not present

## 2018-10-13 DIAGNOSIS — R41841 Cognitive communication deficit: Secondary | ICD-10-CM | POA: Diagnosis not present

## 2018-10-13 DIAGNOSIS — N186 End stage renal disease: Secondary | ICD-10-CM | POA: Diagnosis not present

## 2018-10-13 DIAGNOSIS — G459 Transient cerebral ischemic attack, unspecified: Secondary | ICD-10-CM | POA: Diagnosis not present

## 2018-10-14 DIAGNOSIS — I69328 Other speech and language deficits following cerebral infarction: Secondary | ICD-10-CM | POA: Diagnosis not present

## 2018-10-14 DIAGNOSIS — I4891 Unspecified atrial fibrillation: Secondary | ICD-10-CM | POA: Diagnosis not present

## 2018-10-14 DIAGNOSIS — G459 Transient cerebral ischemic attack, unspecified: Secondary | ICD-10-CM | POA: Diagnosis not present

## 2018-10-14 DIAGNOSIS — R41841 Cognitive communication deficit: Secondary | ICD-10-CM | POA: Diagnosis not present

## 2018-10-14 DIAGNOSIS — M6281 Muscle weakness (generalized): Secondary | ICD-10-CM | POA: Diagnosis not present

## 2018-10-14 DIAGNOSIS — I472 Ventricular tachycardia: Secondary | ICD-10-CM | POA: Diagnosis not present

## 2018-10-16 DIAGNOSIS — I472 Ventricular tachycardia: Secondary | ICD-10-CM | POA: Diagnosis not present

## 2018-10-16 DIAGNOSIS — D631 Anemia in chronic kidney disease: Secondary | ICD-10-CM | POA: Diagnosis not present

## 2018-10-16 DIAGNOSIS — N186 End stage renal disease: Secondary | ICD-10-CM | POA: Diagnosis not present

## 2018-10-16 DIAGNOSIS — D509 Iron deficiency anemia, unspecified: Secondary | ICD-10-CM | POA: Diagnosis not present

## 2018-10-16 DIAGNOSIS — Z23 Encounter for immunization: Secondary | ICD-10-CM | POA: Diagnosis not present

## 2018-10-16 DIAGNOSIS — I4891 Unspecified atrial fibrillation: Secondary | ICD-10-CM | POA: Diagnosis not present

## 2018-10-16 DIAGNOSIS — I69328 Other speech and language deficits following cerebral infarction: Secondary | ICD-10-CM | POA: Diagnosis not present

## 2018-10-16 DIAGNOSIS — R41841 Cognitive communication deficit: Secondary | ICD-10-CM | POA: Diagnosis not present

## 2018-10-16 DIAGNOSIS — G459 Transient cerebral ischemic attack, unspecified: Secondary | ICD-10-CM | POA: Diagnosis not present

## 2018-10-16 DIAGNOSIS — M6281 Muscle weakness (generalized): Secondary | ICD-10-CM | POA: Diagnosis not present

## 2018-10-16 DIAGNOSIS — Z992 Dependence on renal dialysis: Secondary | ICD-10-CM | POA: Diagnosis not present

## 2018-10-17 ENCOUNTER — Other Ambulatory Visit (HOSPITAL_COMMUNITY): Payer: Medicare Other

## 2018-10-17 DIAGNOSIS — M6281 Muscle weakness (generalized): Secondary | ICD-10-CM | POA: Diagnosis not present

## 2018-10-17 DIAGNOSIS — I472 Ventricular tachycardia: Secondary | ICD-10-CM | POA: Diagnosis not present

## 2018-10-17 DIAGNOSIS — I69328 Other speech and language deficits following cerebral infarction: Secondary | ICD-10-CM | POA: Diagnosis not present

## 2018-10-17 DIAGNOSIS — R41841 Cognitive communication deficit: Secondary | ICD-10-CM | POA: Diagnosis not present

## 2018-10-17 DIAGNOSIS — G459 Transient cerebral ischemic attack, unspecified: Secondary | ICD-10-CM | POA: Diagnosis not present

## 2018-10-17 DIAGNOSIS — I4891 Unspecified atrial fibrillation: Secondary | ICD-10-CM | POA: Diagnosis not present

## 2018-10-18 DIAGNOSIS — I4891 Unspecified atrial fibrillation: Secondary | ICD-10-CM | POA: Diagnosis not present

## 2018-10-18 DIAGNOSIS — I472 Ventricular tachycardia: Secondary | ICD-10-CM | POA: Diagnosis not present

## 2018-10-18 DIAGNOSIS — Z992 Dependence on renal dialysis: Secondary | ICD-10-CM | POA: Diagnosis not present

## 2018-10-18 DIAGNOSIS — M6281 Muscle weakness (generalized): Secondary | ICD-10-CM | POA: Diagnosis not present

## 2018-10-18 DIAGNOSIS — N186 End stage renal disease: Secondary | ICD-10-CM | POA: Diagnosis not present

## 2018-10-18 DIAGNOSIS — G459 Transient cerebral ischemic attack, unspecified: Secondary | ICD-10-CM | POA: Diagnosis not present

## 2018-10-18 DIAGNOSIS — I69328 Other speech and language deficits following cerebral infarction: Secondary | ICD-10-CM | POA: Diagnosis not present

## 2018-10-18 DIAGNOSIS — Z23 Encounter for immunization: Secondary | ICD-10-CM | POA: Diagnosis not present

## 2018-10-18 DIAGNOSIS — D631 Anemia in chronic kidney disease: Secondary | ICD-10-CM | POA: Diagnosis not present

## 2018-10-18 DIAGNOSIS — D509 Iron deficiency anemia, unspecified: Secondary | ICD-10-CM | POA: Diagnosis not present

## 2018-10-18 DIAGNOSIS — R41841 Cognitive communication deficit: Secondary | ICD-10-CM | POA: Diagnosis not present

## 2018-10-19 ENCOUNTER — Other Ambulatory Visit: Payer: Self-pay

## 2018-10-19 ENCOUNTER — Inpatient Hospital Stay (HOSPITAL_COMMUNITY): Payer: Medicare Other

## 2018-10-19 DIAGNOSIS — I4891 Unspecified atrial fibrillation: Secondary | ICD-10-CM | POA: Diagnosis not present

## 2018-10-19 DIAGNOSIS — R41841 Cognitive communication deficit: Secondary | ICD-10-CM | POA: Diagnosis not present

## 2018-10-19 DIAGNOSIS — R630 Anorexia: Secondary | ICD-10-CM | POA: Diagnosis not present

## 2018-10-19 DIAGNOSIS — E43 Unspecified severe protein-calorie malnutrition: Secondary | ICD-10-CM | POA: Diagnosis not present

## 2018-10-19 DIAGNOSIS — N186 End stage renal disease: Secondary | ICD-10-CM | POA: Diagnosis not present

## 2018-10-19 DIAGNOSIS — I472 Ventricular tachycardia: Secondary | ICD-10-CM | POA: Diagnosis not present

## 2018-10-19 DIAGNOSIS — Z992 Dependence on renal dialysis: Secondary | ICD-10-CM | POA: Diagnosis not present

## 2018-10-19 DIAGNOSIS — G459 Transient cerebral ischemic attack, unspecified: Secondary | ICD-10-CM | POA: Diagnosis not present

## 2018-10-19 DIAGNOSIS — I69328 Other speech and language deficits following cerebral infarction: Secondary | ICD-10-CM | POA: Diagnosis not present

## 2018-10-19 DIAGNOSIS — F329 Major depressive disorder, single episode, unspecified: Secondary | ICD-10-CM | POA: Diagnosis not present

## 2018-10-19 DIAGNOSIS — R899 Unspecified abnormal finding in specimens from other organs, systems and tissues: Secondary | ICD-10-CM | POA: Diagnosis not present

## 2018-10-19 DIAGNOSIS — R5381 Other malaise: Secondary | ICD-10-CM | POA: Diagnosis not present

## 2018-10-19 DIAGNOSIS — M6281 Muscle weakness (generalized): Secondary | ICD-10-CM | POA: Diagnosis not present

## 2018-10-19 DIAGNOSIS — N189 Chronic kidney disease, unspecified: Secondary | ICD-10-CM

## 2018-10-19 DIAGNOSIS — D631 Anemia in chronic kidney disease: Secondary | ICD-10-CM | POA: Diagnosis not present

## 2018-10-19 DIAGNOSIS — I120 Hypertensive chronic kidney disease with stage 5 chronic kidney disease or end stage renal disease: Secondary | ICD-10-CM | POA: Diagnosis not present

## 2018-10-19 DIAGNOSIS — M25559 Pain in unspecified hip: Secondary | ICD-10-CM | POA: Diagnosis not present

## 2018-10-19 LAB — CBC WITH DIFFERENTIAL/PLATELET
ABS IMMATURE GRANULOCYTES: 0.05 10*3/uL (ref 0.00–0.07)
Basophils Absolute: 0 10*3/uL (ref 0.0–0.1)
Basophils Relative: 1 %
Eosinophils Absolute: 0.1 10*3/uL (ref 0.0–0.5)
Eosinophils Relative: 1 %
HCT: 34.1 % — ABNORMAL LOW (ref 36.0–46.0)
HEMOGLOBIN: 11.2 g/dL — AB (ref 12.0–15.0)
IMMATURE GRANULOCYTES: 1 %
LYMPHS PCT: 25 %
Lymphs Abs: 1.4 10*3/uL (ref 0.7–4.0)
MCH: 25.8 pg — ABNORMAL LOW (ref 26.0–34.0)
MCHC: 32.8 g/dL (ref 30.0–36.0)
MCV: 78.6 fL — ABNORMAL LOW (ref 80.0–100.0)
Monocytes Absolute: 0.4 10*3/uL (ref 0.1–1.0)
Monocytes Relative: 7 %
Neutro Abs: 3.7 10*3/uL (ref 1.7–7.7)
Neutrophils Relative %: 65 %
Platelets: 284 10*3/uL (ref 150–400)
RBC: 4.34 MIL/uL (ref 3.87–5.11)
RDW: 19.4 % — ABNORMAL HIGH (ref 11.5–15.5)
WBC: 5.6 10*3/uL (ref 4.0–10.5)
nRBC: 0 % (ref 0.0–0.2)

## 2018-10-19 LAB — IRON AND TIBC: Iron: 35 ug/dL (ref 28–170)

## 2018-10-19 LAB — COMPREHENSIVE METABOLIC PANEL
ALT: 10 U/L (ref 0–44)
AST: 21 U/L (ref 15–41)
Albumin: 2.1 g/dL — ABNORMAL LOW (ref 3.5–5.0)
Alkaline Phosphatase: 63 U/L (ref 38–126)
Anion gap: 14 (ref 5–15)
BUN: 10 mg/dL (ref 8–23)
CO2: 25 mmol/L (ref 22–32)
Calcium: 7.4 mg/dL — ABNORMAL LOW (ref 8.9–10.3)
Chloride: 99 mmol/L (ref 98–111)
Creatinine, Ser: 2.86 mg/dL — ABNORMAL HIGH (ref 0.44–1.00)
GFR calc Af Amer: 19 mL/min — ABNORMAL LOW (ref >60)
GFR calc non Af Amer: 16 mL/min — ABNORMAL LOW (ref >60)
Glucose, Bld: 101 mg/dL — ABNORMAL HIGH (ref 70–99)
POTASSIUM: 3.5 mmol/L (ref 3.5–5.1)
Sodium: 138 mmol/L (ref 135–145)
Total Bilirubin: 0.9 mg/dL (ref 0.3–1.2)
Total Protein: 6.4 g/dL — ABNORMAL LOW (ref 6.5–8.1)

## 2018-10-19 LAB — SEDIMENTATION RATE: Sed Rate: 34 mm/hr — ABNORMAL HIGH (ref 0–22)

## 2018-10-19 LAB — FERRITIN: Ferritin: 3395 ng/mL — ABNORMAL HIGH (ref 11–307)

## 2018-10-19 LAB — C-REACTIVE PROTEIN: CRP: 5.3 mg/dL — ABNORMAL HIGH (ref <1.0)

## 2018-10-19 LAB — LACTATE DEHYDROGENASE: LDH: 226 U/L — ABNORMAL HIGH (ref 98–192)

## 2018-10-20 DIAGNOSIS — G459 Transient cerebral ischemic attack, unspecified: Secondary | ICD-10-CM | POA: Diagnosis not present

## 2018-10-20 DIAGNOSIS — I69328 Other speech and language deficits following cerebral infarction: Secondary | ICD-10-CM | POA: Diagnosis not present

## 2018-10-20 DIAGNOSIS — M6281 Muscle weakness (generalized): Secondary | ICD-10-CM | POA: Diagnosis not present

## 2018-10-20 DIAGNOSIS — Z992 Dependence on renal dialysis: Secondary | ICD-10-CM | POA: Diagnosis not present

## 2018-10-20 DIAGNOSIS — I4891 Unspecified atrial fibrillation: Secondary | ICD-10-CM | POA: Diagnosis not present

## 2018-10-20 DIAGNOSIS — Z23 Encounter for immunization: Secondary | ICD-10-CM | POA: Diagnosis not present

## 2018-10-20 DIAGNOSIS — R41841 Cognitive communication deficit: Secondary | ICD-10-CM | POA: Diagnosis not present

## 2018-10-20 DIAGNOSIS — N186 End stage renal disease: Secondary | ICD-10-CM | POA: Diagnosis not present

## 2018-10-20 DIAGNOSIS — D631 Anemia in chronic kidney disease: Secondary | ICD-10-CM | POA: Diagnosis not present

## 2018-10-20 DIAGNOSIS — I472 Ventricular tachycardia: Secondary | ICD-10-CM | POA: Diagnosis not present

## 2018-10-20 DIAGNOSIS — D509 Iron deficiency anemia, unspecified: Secondary | ICD-10-CM | POA: Diagnosis not present

## 2018-10-20 LAB — PROTEIN ELECTROPHORESIS, SERUM
A/G Ratio: 0.7 (ref 0.7–1.7)
ALBUMIN ELP: 2.5 g/dL — AB (ref 2.9–4.4)
Alpha-1-Globulin: 0.2 g/dL (ref 0.0–0.4)
Alpha-2-Globulin: 0.8 g/dL (ref 0.4–1.0)
Beta Globulin: 0.7 g/dL (ref 0.7–1.3)
Gamma Globulin: 2 g/dL — ABNORMAL HIGH (ref 0.4–1.8)
Globulin, Total: 3.7 g/dL (ref 2.2–3.9)
Total Protein ELP: 6.2 g/dL (ref 6.0–8.5)

## 2018-10-20 LAB — RHEUMATOID FACTOR: Rheumatoid fact SerPl-aCnc: 10.4 IU/mL (ref 0.0–13.9)

## 2018-10-23 DIAGNOSIS — E43 Unspecified severe protein-calorie malnutrition: Secondary | ICD-10-CM | POA: Diagnosis not present

## 2018-10-23 DIAGNOSIS — Z23 Encounter for immunization: Secondary | ICD-10-CM | POA: Diagnosis not present

## 2018-10-23 DIAGNOSIS — Z992 Dependence on renal dialysis: Secondary | ICD-10-CM | POA: Diagnosis not present

## 2018-10-23 DIAGNOSIS — G459 Transient cerebral ischemic attack, unspecified: Secondary | ICD-10-CM | POA: Diagnosis not present

## 2018-10-23 DIAGNOSIS — I69328 Other speech and language deficits following cerebral infarction: Secondary | ICD-10-CM | POA: Diagnosis not present

## 2018-10-23 DIAGNOSIS — F329 Major depressive disorder, single episode, unspecified: Secondary | ICD-10-CM | POA: Diagnosis not present

## 2018-10-23 DIAGNOSIS — I4891 Unspecified atrial fibrillation: Secondary | ICD-10-CM | POA: Diagnosis not present

## 2018-10-23 DIAGNOSIS — N186 End stage renal disease: Secondary | ICD-10-CM | POA: Diagnosis not present

## 2018-10-23 DIAGNOSIS — D631 Anemia in chronic kidney disease: Secondary | ICD-10-CM | POA: Diagnosis not present

## 2018-10-23 DIAGNOSIS — D509 Iron deficiency anemia, unspecified: Secondary | ICD-10-CM | POA: Diagnosis not present

## 2018-10-23 DIAGNOSIS — M6281 Muscle weakness (generalized): Secondary | ICD-10-CM | POA: Diagnosis not present

## 2018-10-23 DIAGNOSIS — R41841 Cognitive communication deficit: Secondary | ICD-10-CM | POA: Diagnosis not present

## 2018-10-23 DIAGNOSIS — R627 Adult failure to thrive: Secondary | ICD-10-CM | POA: Diagnosis not present

## 2018-10-23 DIAGNOSIS — I472 Ventricular tachycardia: Secondary | ICD-10-CM | POA: Diagnosis not present

## 2018-10-23 LAB — HEMOCHROMATOSIS DNA-PCR(C282Y,H63D)

## 2018-10-24 DIAGNOSIS — R41841 Cognitive communication deficit: Secondary | ICD-10-CM | POA: Diagnosis not present

## 2018-10-24 DIAGNOSIS — M6281 Muscle weakness (generalized): Secondary | ICD-10-CM | POA: Diagnosis not present

## 2018-10-24 DIAGNOSIS — I4891 Unspecified atrial fibrillation: Secondary | ICD-10-CM | POA: Diagnosis not present

## 2018-10-24 DIAGNOSIS — G459 Transient cerebral ischemic attack, unspecified: Secondary | ICD-10-CM | POA: Diagnosis not present

## 2018-10-24 DIAGNOSIS — I472 Ventricular tachycardia: Secondary | ICD-10-CM | POA: Diagnosis not present

## 2018-10-24 DIAGNOSIS — I69328 Other speech and language deficits following cerebral infarction: Secondary | ICD-10-CM | POA: Diagnosis not present

## 2018-10-25 ENCOUNTER — Emergency Department (HOSPITAL_COMMUNITY)
Admission: EM | Admit: 2018-10-25 | Discharge: 2018-10-25 | Disposition: A | Discharge status: Home / Self Care | Payer: Medicare Other | Attending: Emergency Medicine | Admitting: Emergency Medicine

## 2018-10-25 ENCOUNTER — Encounter (HOSPITAL_COMMUNITY): Payer: Self-pay | Admitting: Emergency Medicine

## 2018-10-25 ENCOUNTER — Other Ambulatory Visit: Payer: Self-pay

## 2018-10-25 ENCOUNTER — Emergency Department (HOSPITAL_COMMUNITY): Payer: Medicare Other

## 2018-10-25 DIAGNOSIS — Z992 Dependence on renal dialysis: Secondary | ICD-10-CM | POA: Diagnosis not present

## 2018-10-25 DIAGNOSIS — D631 Anemia in chronic kidney disease: Secondary | ICD-10-CM | POA: Diagnosis not present

## 2018-10-25 DIAGNOSIS — M6281 Muscle weakness (generalized): Secondary | ICD-10-CM | POA: Diagnosis not present

## 2018-10-25 DIAGNOSIS — R531 Weakness: Secondary | ICD-10-CM

## 2018-10-25 DIAGNOSIS — E039 Hypothyroidism, unspecified: Secondary | ICD-10-CM | POA: Diagnosis not present

## 2018-10-25 DIAGNOSIS — G459 Transient cerebral ischemic attack, unspecified: Secondary | ICD-10-CM | POA: Diagnosis not present

## 2018-10-25 DIAGNOSIS — I120 Hypertensive chronic kidney disease with stage 5 chronic kidney disease or end stage renal disease: Secondary | ICD-10-CM | POA: Insufficient documentation

## 2018-10-25 DIAGNOSIS — Z8541 Personal history of malignant neoplasm of cervix uteri: Secondary | ICD-10-CM | POA: Insufficient documentation

## 2018-10-25 DIAGNOSIS — N186 End stage renal disease: Secondary | ICD-10-CM | POA: Diagnosis not present

## 2018-10-25 DIAGNOSIS — R0602 Shortness of breath: Secondary | ICD-10-CM | POA: Diagnosis not present

## 2018-10-25 DIAGNOSIS — D509 Iron deficiency anemia, unspecified: Secondary | ICD-10-CM | POA: Diagnosis not present

## 2018-10-25 DIAGNOSIS — I69328 Other speech and language deficits following cerebral infarction: Secondary | ICD-10-CM | POA: Diagnosis not present

## 2018-10-25 DIAGNOSIS — R0789 Other chest pain: Secondary | ICD-10-CM | POA: Diagnosis not present

## 2018-10-25 DIAGNOSIS — R41841 Cognitive communication deficit: Secondary | ICD-10-CM | POA: Diagnosis not present

## 2018-10-25 DIAGNOSIS — R4182 Altered mental status, unspecified: Secondary | ICD-10-CM | POA: Diagnosis not present

## 2018-10-25 DIAGNOSIS — Z7401 Bed confinement status: Secondary | ICD-10-CM | POA: Diagnosis not present

## 2018-10-25 DIAGNOSIS — I4891 Unspecified atrial fibrillation: Secondary | ICD-10-CM | POA: Diagnosis not present

## 2018-10-25 DIAGNOSIS — I472 Ventricular tachycardia: Secondary | ICD-10-CM | POA: Diagnosis not present

## 2018-10-25 DIAGNOSIS — Z23 Encounter for immunization: Secondary | ICD-10-CM | POA: Diagnosis not present

## 2018-10-25 DIAGNOSIS — R079 Chest pain, unspecified: Secondary | ICD-10-CM | POA: Diagnosis not present

## 2018-10-25 LAB — COMPREHENSIVE METABOLIC PANEL WITH GFR
ALT: 15 U/L (ref 0–44)
AST: 28 U/L (ref 15–41)
Albumin: 2.2 g/dL — ABNORMAL LOW (ref 3.5–5.0)
Alkaline Phosphatase: 68 U/L (ref 38–126)
Anion gap: 12 (ref 5–15)
BUN: 10 mg/dL (ref 8–23)
CO2: 24 mmol/L (ref 22–32)
Calcium: 7.5 mg/dL — ABNORMAL LOW (ref 8.9–10.3)
Chloride: 102 mmol/L (ref 98–111)
Creatinine, Ser: 2.34 mg/dL — ABNORMAL HIGH (ref 0.44–1.00)
GFR calc Af Amer: 24 mL/min — ABNORMAL LOW
GFR calc non Af Amer: 21 mL/min — ABNORMAL LOW
Glucose, Bld: 106 mg/dL — ABNORMAL HIGH (ref 70–99)
Potassium: 3 mmol/L — ABNORMAL LOW (ref 3.5–5.1)
Sodium: 138 mmol/L (ref 135–145)
Total Bilirubin: 0.7 mg/dL (ref 0.3–1.2)
Total Protein: 6.4 g/dL — ABNORMAL LOW (ref 6.5–8.1)

## 2018-10-25 LAB — CBC WITH DIFFERENTIAL/PLATELET
ABS IMMATURE GRANULOCYTES: 0.08 10*3/uL — AB (ref 0.00–0.07)
Basophils Absolute: 0 10*3/uL (ref 0.0–0.1)
Basophils Relative: 1 %
Eosinophils Absolute: 0 10*3/uL (ref 0.0–0.5)
Eosinophils Relative: 0 %
HCT: 31.6 % — ABNORMAL LOW (ref 36.0–46.0)
Hemoglobin: 10.1 g/dL — ABNORMAL LOW (ref 12.0–15.0)
Immature Granulocytes: 1 %
Lymphocytes Relative: 15 %
Lymphs Abs: 1.2 10*3/uL (ref 0.7–4.0)
MCH: 25.3 pg — ABNORMAL LOW (ref 26.0–34.0)
MCHC: 32 g/dL (ref 30.0–36.0)
MCV: 79 fL — ABNORMAL LOW (ref 80.0–100.0)
MONOS PCT: 6 %
Monocytes Absolute: 0.5 10*3/uL (ref 0.1–1.0)
Neutro Abs: 6.3 10*3/uL (ref 1.7–7.7)
Neutrophils Relative %: 77 %
Platelets: 249 10*3/uL (ref 150–400)
RBC: 4 MIL/uL (ref 3.87–5.11)
RDW: 18.9 % — ABNORMAL HIGH (ref 11.5–15.5)
WBC: 8.1 10*3/uL (ref 4.0–10.5)
nRBC: 0 % (ref 0.0–0.2)

## 2018-10-25 LAB — TROPONIN I: Troponin I: <0.03 ng/mL (ref <0.03)

## 2018-10-25 NOTE — ED Notes (Signed)
MD Long removed dialysis clamp, no evident bleeding to site. Coban applied for dressing securement.

## 2018-10-25 NOTE — Discharge Instructions (Signed)
You were seen in the ED today after shortness of breath in dialysis. Your x-ray and labs are looking good. Keep your next dialysis appointment. Return to the ED with any new or worsening symptoms.

## 2018-10-25 NOTE — ED Provider Notes (Signed)
Emergency Department Provider Note   I have reviewed the triage vital signs and the nursing notes.   HISTORY  Chief Complaint Shortness of Breath   HPI Sonya [name] is a 68 y.o. female with PMH of HTN, GERD, ESRD, and prior CVA resents to the emergency department from dialysis by EMS.  Toward the end of dialysis she began to feel generally weak, body aches, short of breath.  She endorses some chest discomfort.  According to EMS the patient became hypotensive with dialysis but they are unsure of the number recorded.  Patient did not lose pulses.  She denies any unilateral weakness or numbness.  She continues to feel "pain everywhere."  She denies recent fevers or shaking chills.  She states she was otherwise feeling well until HD today. No travel history.   Past Medical History:  Diagnosis Date  . Anemia   . Anxiety   . CKD (chronic kidney disease)   . Diverticulitis   . Dysphagia   . ESRD (end stage renal disease) ([place])    On HD  . Fracture of femoral neck, left, closed ([facility]) 05/30/2015  . Gastritis 06/2013 & 05/2015  . Gastroesophageal reflux disease with stricture 05/2015  . GERD (gastroesophageal reflux disease)   . HTN (hypertension)   . Hyperparathyroidism due to renal insufficiency ([place])   . Hypertensive urgency 06/2013 & 04/2015  . Hypothyroidism   . ICH (intracerebral hemorrhage) ([place]) 10/2014  . 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 Protein calorie malnutrition ([place])   . Stroke ([place])   . Uterine cancer ([place]) 2012  . Vertical diplopia   . Vertigo     Patient Active Problem List   Diagnosis Date Noted  . Cerebral embolism with cerebral infarction 05/22/2018  . Fracture of condyle of right femur, closed, initial encounter ([place]) 05/16/2018  . Closed fracture of right femur, unspecified fracture morphology, initial encounter ([place]) 05/16/2018  . Closed displaced comminuted fracture of shaft of right femur ([place])   . PAD (peripheral artery disease) ([place]) 03/28/2018  .  Atherosclerotic cerebrovascular disease   . End stage renal failure on dialysis ([place])   . Altered mental state 09/28/2017  . Acute ischemic stroke ([place]) 09/28/2017  . Acute metabolic encephalopathy 54/98/2641  . Hyperlipidemia 09/27/2017  . Altered mental status 09/26/2017  . Lactic acidosis 09/26/2017  . Anxiety state 02/03/2017  . Malnutrition of moderate degree 01/19/2017  . Hypoglycemia 01/18/2017  . 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 ESRD (end stage renal disease) ([place])   . Abnormal weight loss 12/01/2015  . Pulmonary edema 08/19/2015  . CAP (community acquired pneumonia) 08/19/2015  . Hypothyroidism, adult 06/14/2015  . HA (headache) 06/13/2015  . HTN (hypertension), malignant 06/13/2015  . Elective surgery   . Fracture of femoral neck, left, closed ([address]) 05/30/2015  . Hip fracture ([place]) 05/30/2015  . Closed fracture of neck of left femur ([place])   . Gastroesophageal reflux disease with stricture 05/03/2015  . UTI (lower urinary tract infection) 04/14/2015  . Orthostatic hypotension 11/27/2014  . 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 Acute encephalopathy   . Confusion   . Protein-calorie malnutrition, severe ([place]) 10/15/2014  . Dysphagia 10/15/2014  . Intracerebral bleed ([place]) 10/14/2014  . End stage renal disease ([place]) 08/31/2013  . Symptomatic anemia 08/18/2013  . Hyperparathyroidism due to renal insufficiency ([place]) 07/09/2013  . Constipation 06/21/2013  . Gastritis 06/21/2013  . Chronic kidney disease 06/20/2013  . Anemia in chronic kidney disease 06/08/2013  . Gout 06/08/2013  .  Other dysphagia 06/07/2013  . Essential hypertension 04/08/2011  . Colon cancer screening 04/08/2011    Past Surgical History:  Procedure Laterality Date  . ABDOMINAL AORTOGRAM N/A 04/05/2018   Procedure: ABDOMINAL AORTOGRAM;  Surgeon: Marty Heck, MD;  Location: [facility];  Service: Cardiovascular;  Laterality: N/A;  . ABDOMINAL  HYSTERECTOMY    . APPENDECTOMY    . BASCILIC VEIN TRANSPOSITION Right 09/17/2013   Procedure: BRACHIAL VEIN TRANSPOSITION;  Surgeon: Conrad Carrollton, MD;  Location: [facility];  Service: Vascular;  Laterality: Right;  . BASCILIC VEIN TRANSPOSITION Right 10/29/2013   Procedure: RIGHT SECOND STAGE BRACHIAL VEIN TRANSPOSITION;  Surgeon: Conrad Hamberg, MD;  Location: [facility];  Service: Vascular;  Laterality: Right;  . CESAREAN SECTION    . CHOLECYSTECTOMY    . COLONOSCOPY  2000   TICS, IH  . COLONOSCOPY  2003 NUR BRBPR D50 V6   DC/[place] TICS, IH  . COLONOSCOPY N/A 09/04/2013   SLF: Small ulcer in the descending colon, one colon polyp (tubular adenoma), large internal hemorrhoids, moderate diverticulosis. next tcs 10 years.  . ESOPHAGOGASTRODUODENOSCOPY N/A 05/23/2015   NGE:XBMW non-erosive gastritis/stricture at the gastroesophageal junction  . ESOPHAGOGASTRODUODENOSCOPY (EGD) WITH ESOPHAGEAL DILATION N/A 06/08/2013   Dr. Fields:Stricture at the gastroesophageal junction/MILD NON-erosive gastritis (inflammation) was found in the gastric antrum; multiple biopsies/NO SOURCE FOR ANEMIA IDETIFIED-MOST LIKELY DUE TO ANEMIA OF CHRONIC DISEASE  . FLEXIBLE SIGMOIDOSCOPY N/A 01/02/2016   Dr. Oneida Alar: external hemorrhoids, diverticulosis, internal hemorrhoids, proximal rectum normal  . GIVENS CAPSULE STUDY N/A 12/03/2013   SLF: occasional gastric erosion. small bowel normal  . HIP PINNING,CANNULATED Left 05/31/2015   Procedure: CANNULATED HIP PINNING;  Surgeon: Leandrew Koyanagi, MD;  Location: [facility];  Service: Orthopedics;  Laterality: Left;  . INSERTION OF DIALYSIS CATHETER Left 09/17/2013   Procedure: INSERTION OF DIALYSIS CATHETER;  Surgeon: Conrad Bagdad, MD;  Location: [facility];  Service: Vascular;  Laterality: Left;  . LAPAROSCOPIC TOTAL HYSTERECTOMY    . LOWER EXTREMITY ANGIOGRAPHY Bilateral 04/05/2018   Procedure: Lower Extremity Angiography;  Surgeon: Marty Heck, MD;  Location: [facility];  Service:  Cardiovascular;  Laterality: Bilateral;  . NASAL SEPTUM SURGERY    . ORIF FEMUR FRACTURE Right 05/17/2018   Procedure: OPEN REDUCTION INTERNAL FIXATION (ORIF) DISTAL FEMUR FRACTURE;  Surgeon: Shona Needles, MD;  Location: [facility];  Service: Orthopedics;  Laterality: Right;  . PERIPHERAL VASCULAR ATHERECTOMY Right 04/05/2018   Procedure: PERIPHERAL VASCULAR ATHERECTOMY;  Surgeon: Marty Heck, MD;  Location: [facility];  Service: Cardiovascular;  Laterality: Right;  Peroneal   . PERIPHERAL VASCULAR BALLOON ANGIOPLASTY Right 04/05/2018   Procedure: PERIPHERAL VASCULAR BALLOON ANGIOPLASTY;  Surgeon: Marty Heck, MD;  Location: [facility];  Service: Cardiovascular;  Laterality: Right;  Posterior tibial  . SAVORY DILATION N/A 05/23/2015   Procedure: SAVORY DILATION;  Surgeon: Danie Binder, MD;  [facility];  Service: Endoscopy;  Laterality: N/A;  . THROMBECTOMY AND REVISION OF ARTERIOVENTOUS (AV) GORETEX  GRAFT Right 07/06/2017   Procedure: THROMBECTOMY AND REVISION OF ARTERIOVENOUS (AV) GORETEX  GRAFT RIGHT UPPER ARM;  Surgeon: Rosetta Posner, MD;  Location: [facility];  Service: Vascular;  Laterality: Right;    Allergies Cephalexin  Family History  Problem Relation Age of Onset  . Cancer Mother   . Diabetes Mother   . Hypertension Mother   . Diabetes Father   . Hypertension Father   . Hypertension Sister   .  Colon cancer Neg Hx     Social History Social History   Tobacco Use  . Smoking status: Never Smoker  . Smokeless tobacco: Never Used  Substance Use Topics  . Alcohol use: No  . Drug use: No    Review of Systems  Constitutional: No fever/chills. Positive total body pain and weakness.  Eyes: No visual changes. ENT: No sore throat. Cardiovascular: Positive chest pain. Respiratory: Positive shortness of breath. Gastrointestinal: No abdominal pain.  No nausea, no vomiting.  No diarrhea.  No constipation. Genitourinary: Negative for  dysuria. Musculoskeletal: Negative for back pain. Skin: Negative for rash. Neurological: Negative for headaches, focal weakness or numbness.  10-point ROS otherwise negative.  ____________________________________________   PHYSICAL EXAM:  VITAL SIGNS: ED Triage Vitals  Enc Vitals Group     BP 10/25/18 1537 112/60     Pulse Rate 10/25/18 1543 81     Resp 10/25/18 1543 (!) 24     Temp 10/25/18 1543 98.1 F (36.7 C)     Temp Source 10/25/18 1543 Oral     SpO2 10/25/18 1543 100 %     Weight 10/25/18 1538 106 lb (48.1 kg)     Height 10/25/18 1538 5\' 4"  (1.626 m)     Pain Score 10/25/18 1537 10   Constitutional: Alert and oriented. Well appearing and in no acute distress. Eyes: Conjunctivae are normal.  Head: Atraumatic. Nose: No congestion/rhinnorhea. Mouth/Throat: Mucous membranes are moist. Neck: No stridor.   Cardiovascular: Normal rate, regular rhythm. Good peripheral circulation. Grossly normal heart sounds.   Respiratory: Normal respiratory effort.  No retractions. Lungs CTAB. Gastrointestinal: Soft and nontender. No distention.  Musculoskeletal: No lower extremity tenderness nor edema. No gross deformities of extremities. Neurologic:  Normal speech and language. No gross focal neurologic deficits are appreciated.  Skin:  Skin is warm, dry and intact. No rash noted.  ____________________________________________   LABS (all labs ordered are listed, but only abnormal results are displayed)  Labs Reviewed  COMPREHENSIVE METABOLIC PANEL - Abnormal; Notable for the following components:      Result Value   Potassium 3.0 (*)    Glucose, Bld 106 (*)    Creatinine, Ser 2.34 (*)    Calcium 7.5 (*)    Total Protein 6.4 (*)    Albumin 2.2 (*)    GFR calc non Af Amer 21 (*)    GFR calc Af Amer 24 (*)    All other components within normal limits  CBC WITH DIFFERENTIAL/PLATELET - Abnormal; Notable for the following components:   Hemoglobin 10.1 (*)    HCT 31.6 (*)    MCV  79.0 (*)    MCH 25.3 (*)    RDW 18.9 (*)    Abs Immature Granulocytes 0.08 (*)    All other components within normal limits  TROPONIN I   ____________________________________________  EKG   EKG Interpretation  Date/Time:  Wednesday October 25 2018 15:43:23 EDT Ventricular Rate:  84 PR Interval:    QRS Duration: 80 QT Interval:  460 QTC Calculation: 544 R Axis:   83 Text Interpretation:  Sinus rhythm Biatrial enlargement Borderline right axis deviation Probable left ventricular hypertrophy Anterior Q waves, possibly due to LVH Prolonged QT interval Baseline wander in lead(s) V1 No STEMI.  Confirmed by Nanda Quinton [id number]) on 10/25/2018 3:59:07 PM       ____________________________________________  RADIOLOGY  Dg Chest 2 View  Result Date: 10/25/2018 CLINICAL DATA:  Shortness of breath. End-stage renal disease and dialysis patient. EXAM: CHEST -  2 VIEW COMPARISON:  09/12/2018 FINDINGS: Lungs are clear without pulmonary edema. No focal airspace disease. Heart and mediastinum are within normal limits. Trachea is midline. Negative for a pneumothorax. Atherosclerotic calcifications at the aortic arch. No pleural effusions. IMPRESSION: No active cardiopulmonary disease. Electronically Signed   By: Markus Daft M.D.   On: 10/25/2018 16:30    ____________________________________________   PROCEDURES  Procedure(s) performed:   Procedures  None  ____________________________________________   INITIAL IMPRESSION / ASSESSMENT AND PLAN / ED COURSE  Pertinent labs & imaging results that were available during my care of the patient were reviewed by me and considered in my medical decision making (see chart for details).  Patient arrives from dialysis with complaint of weakness, total body pain, shortness of breath.  She is no longer hypotensive.  Afebrile here with normal oxygen saturation.  She is complaining of "pain everywhere."  No focal tenderness on exam of the abdomen.  EKG  without acute ischemic changes and similar to prior.  Plan for chest x-ray, screening labs, monitoring, and reassess.  05:45 PM  Patient labs reviewed and near baseline.  No findings on chest x-ray such as infiltrate.  Patient is awake and alert.  She states she is feeling well.  Denies pain.  No arrhythmias here.  No hypoxemia or low blood pressures while monitoring in the emergency department.  Feel the patient is safe for discharge.  She is to continue her dialysis as scheduled.  ____________________________________________  FINAL CLINICAL IMPRESSION(S) / ED DIAGNOSES  Final diagnoses:  Shortness of breath  Weakness    Note:  This document was prepared using Dragon voice recognition software and may include unintentional dictation errors.  Nanda Quinton, MD Emergency Medicine    Denyla Cortese, [facility], MD 10/25/18 [phone]

## 2018-10-25 NOTE — ED Triage Notes (Signed)
Pt from dialysis, had small amount of fluid pulled off during tx. Had approx 24 minutes left in tx and told staff she felt SOB, hypotensive, and achy. Pt reports she feels "terrible".

## 2018-10-26 DIAGNOSIS — I472 Ventricular tachycardia: Secondary | ICD-10-CM | POA: Diagnosis not present

## 2018-10-26 DIAGNOSIS — F329 Major depressive disorder, single episode, unspecified: Secondary | ICD-10-CM | POA: Diagnosis not present

## 2018-10-26 DIAGNOSIS — R41841 Cognitive communication deficit: Secondary | ICD-10-CM | POA: Diagnosis not present

## 2018-10-26 DIAGNOSIS — I4891 Unspecified atrial fibrillation: Secondary | ICD-10-CM | POA: Diagnosis not present

## 2018-10-26 DIAGNOSIS — B370 Candidal stomatitis: Secondary | ICD-10-CM | POA: Diagnosis not present

## 2018-10-26 DIAGNOSIS — E43 Unspecified severe protein-calorie malnutrition: Secondary | ICD-10-CM | POA: Diagnosis not present

## 2018-10-26 DIAGNOSIS — R627 Adult failure to thrive: Secondary | ICD-10-CM | POA: Diagnosis not present

## 2018-10-26 DIAGNOSIS — G459 Transient cerebral ischemic attack, unspecified: Secondary | ICD-10-CM | POA: Diagnosis not present

## 2018-10-26 DIAGNOSIS — I69328 Other speech and language deficits following cerebral infarction: Secondary | ICD-10-CM | POA: Diagnosis not present

## 2018-10-26 DIAGNOSIS — M6281 Muscle weakness (generalized): Secondary | ICD-10-CM | POA: Diagnosis not present

## 2018-10-27 DIAGNOSIS — I69328 Other speech and language deficits following cerebral infarction: Secondary | ICD-10-CM | POA: Diagnosis not present

## 2018-10-27 DIAGNOSIS — R41841 Cognitive communication deficit: Secondary | ICD-10-CM | POA: Diagnosis not present

## 2018-10-27 DIAGNOSIS — I4891 Unspecified atrial fibrillation: Secondary | ICD-10-CM | POA: Diagnosis not present

## 2018-10-27 DIAGNOSIS — G459 Transient cerebral ischemic attack, unspecified: Secondary | ICD-10-CM | POA: Diagnosis not present

## 2018-10-27 DIAGNOSIS — M6281 Muscle weakness (generalized): Secondary | ICD-10-CM | POA: Diagnosis not present

## 2018-10-27 DIAGNOSIS — I472 Ventricular tachycardia: Secondary | ICD-10-CM | POA: Diagnosis not present

## 2018-10-28 DIAGNOSIS — N186 End stage renal disease: Secondary | ICD-10-CM | POA: Diagnosis not present

## 2018-10-28 DIAGNOSIS — Z23 Encounter for immunization: Secondary | ICD-10-CM | POA: Diagnosis not present

## 2018-10-28 DIAGNOSIS — D631 Anemia in chronic kidney disease: Secondary | ICD-10-CM | POA: Diagnosis not present

## 2018-10-28 DIAGNOSIS — Z992 Dependence on renal dialysis: Secondary | ICD-10-CM | POA: Diagnosis not present

## 2018-10-28 DIAGNOSIS — D509 Iron deficiency anemia, unspecified: Secondary | ICD-10-CM | POA: Diagnosis not present

## 2018-10-30 DIAGNOSIS — M6281 Muscle weakness (generalized): Secondary | ICD-10-CM | POA: Diagnosis not present

## 2018-10-30 DIAGNOSIS — G459 Transient cerebral ischemic attack, unspecified: Secondary | ICD-10-CM | POA: Diagnosis not present

## 2018-10-30 DIAGNOSIS — R41841 Cognitive communication deficit: Secondary | ICD-10-CM | POA: Diagnosis not present

## 2018-10-30 DIAGNOSIS — I4891 Unspecified atrial fibrillation: Secondary | ICD-10-CM | POA: Diagnosis not present

## 2018-10-30 DIAGNOSIS — I69328 Other speech and language deficits following cerebral infarction: Secondary | ICD-10-CM | POA: Diagnosis not present

## 2018-10-30 DIAGNOSIS — I472 Ventricular tachycardia: Secondary | ICD-10-CM | POA: Diagnosis not present

## 2018-10-31 DIAGNOSIS — Z23 Encounter for immunization: Secondary | ICD-10-CM | POA: Diagnosis not present

## 2018-10-31 DIAGNOSIS — N186 End stage renal disease: Secondary | ICD-10-CM | POA: Diagnosis not present

## 2018-10-31 DIAGNOSIS — D631 Anemia in chronic kidney disease: Secondary | ICD-10-CM | POA: Diagnosis not present

## 2018-10-31 DIAGNOSIS — D509 Iron deficiency anemia, unspecified: Secondary | ICD-10-CM | POA: Diagnosis not present

## 2018-10-31 DIAGNOSIS — Z992 Dependence on renal dialysis: Secondary | ICD-10-CM | POA: Diagnosis not present

## 2018-11-01 DIAGNOSIS — R630 Anorexia: Secondary | ICD-10-CM | POA: Diagnosis not present

## 2018-11-01 DIAGNOSIS — I4891 Unspecified atrial fibrillation: Secondary | ICD-10-CM | POA: Diagnosis not present

## 2018-11-01 DIAGNOSIS — D631 Anemia in chronic kidney disease: Secondary | ICD-10-CM | POA: Diagnosis not present

## 2018-11-01 DIAGNOSIS — Z992 Dependence on renal dialysis: Secondary | ICD-10-CM | POA: Diagnosis not present

## 2018-11-01 DIAGNOSIS — I2510 Atherosclerotic heart disease of native coronary artery without angina pectoris: Secondary | ICD-10-CM | POA: Diagnosis not present

## 2018-11-01 DIAGNOSIS — N2581 Secondary hyperparathyroidism of renal origin: Secondary | ICD-10-CM | POA: Diagnosis not present

## 2018-11-01 DIAGNOSIS — R627 Adult failure to thrive: Secondary | ICD-10-CM | POA: Diagnosis not present

## 2018-11-01 DIAGNOSIS — D509 Iron deficiency anemia, unspecified: Secondary | ICD-10-CM | POA: Diagnosis not present

## 2018-11-01 DIAGNOSIS — N186 End stage renal disease: Secondary | ICD-10-CM | POA: Diagnosis not present

## 2018-11-02 DIAGNOSIS — N186 End stage renal disease: Secondary | ICD-10-CM | POA: Diagnosis not present

## 2018-11-02 DIAGNOSIS — D509 Iron deficiency anemia, unspecified: Secondary | ICD-10-CM | POA: Diagnosis not present

## 2018-11-02 DIAGNOSIS — Z992 Dependence on renal dialysis: Secondary | ICD-10-CM | POA: Diagnosis not present

## 2018-11-02 DIAGNOSIS — D631 Anemia in chronic kidney disease: Secondary | ICD-10-CM | POA: Diagnosis not present

## 2018-11-02 DIAGNOSIS — N2581 Secondary hyperparathyroidism of renal origin: Secondary | ICD-10-CM | POA: Diagnosis not present

## 2018-11-04 DIAGNOSIS — D631 Anemia in chronic kidney disease: Secondary | ICD-10-CM | POA: Diagnosis not present

## 2018-11-04 DIAGNOSIS — N2581 Secondary hyperparathyroidism of renal origin: Secondary | ICD-10-CM | POA: Diagnosis not present

## 2018-11-04 DIAGNOSIS — N186 End stage renal disease: Secondary | ICD-10-CM | POA: Diagnosis not present

## 2018-11-04 DIAGNOSIS — Z992 Dependence on renal dialysis: Secondary | ICD-10-CM | POA: Diagnosis not present

## 2018-11-04 DIAGNOSIS — D509 Iron deficiency anemia, unspecified: Secondary | ICD-10-CM | POA: Diagnosis not present

## 2018-11-07 ENCOUNTER — Ambulatory Visit (HOSPITAL_COMMUNITY): Payer: Medicare Other | Admitting: Nurse Practitioner

## 2018-11-07 DIAGNOSIS — D509 Iron deficiency anemia, unspecified: Secondary | ICD-10-CM | POA: Diagnosis not present

## 2018-11-07 DIAGNOSIS — Z992 Dependence on renal dialysis: Secondary | ICD-10-CM | POA: Diagnosis not present

## 2018-11-07 DIAGNOSIS — N2581 Secondary hyperparathyroidism of renal origin: Secondary | ICD-10-CM | POA: Diagnosis not present

## 2018-11-07 DIAGNOSIS — N186 End stage renal disease: Secondary | ICD-10-CM | POA: Diagnosis not present

## 2018-11-07 DIAGNOSIS — D631 Anemia in chronic kidney disease: Secondary | ICD-10-CM | POA: Diagnosis not present

## 2018-11-08 DIAGNOSIS — N186 End stage renal disease: Secondary | ICD-10-CM | POA: Diagnosis not present

## 2018-11-08 DIAGNOSIS — T82858A Stenosis of vascular prosthetic devices, implants and grafts, initial encounter: Secondary | ICD-10-CM | POA: Diagnosis not present

## 2018-11-08 DIAGNOSIS — Z992 Dependence on renal dialysis: Secondary | ICD-10-CM | POA: Diagnosis not present

## 2018-11-08 DIAGNOSIS — I871 Compression of vein: Secondary | ICD-10-CM | POA: Diagnosis not present

## 2018-11-09 DIAGNOSIS — D509 Iron deficiency anemia, unspecified: Secondary | ICD-10-CM | POA: Diagnosis not present

## 2018-11-09 DIAGNOSIS — N2581 Secondary hyperparathyroidism of renal origin: Secondary | ICD-10-CM | POA: Diagnosis not present

## 2018-11-09 DIAGNOSIS — D631 Anemia in chronic kidney disease: Secondary | ICD-10-CM | POA: Diagnosis not present

## 2018-11-09 DIAGNOSIS — Z992 Dependence on renal dialysis: Secondary | ICD-10-CM | POA: Diagnosis not present

## 2018-11-09 DIAGNOSIS — N186 End stage renal disease: Secondary | ICD-10-CM | POA: Diagnosis not present

## 2018-11-10 DIAGNOSIS — F321 Major depressive disorder, single episode, moderate: Secondary | ICD-10-CM | POA: Diagnosis not present

## 2018-11-10 DIAGNOSIS — F419 Anxiety disorder, unspecified: Secondary | ICD-10-CM | POA: Diagnosis not present

## 2018-11-11 DIAGNOSIS — D631 Anemia in chronic kidney disease: Secondary | ICD-10-CM | POA: Diagnosis not present

## 2018-11-11 DIAGNOSIS — N2581 Secondary hyperparathyroidism of renal origin: Secondary | ICD-10-CM | POA: Diagnosis not present

## 2018-11-11 DIAGNOSIS — D509 Iron deficiency anemia, unspecified: Secondary | ICD-10-CM | POA: Diagnosis not present

## 2018-11-11 DIAGNOSIS — N186 End stage renal disease: Secondary | ICD-10-CM | POA: Diagnosis not present

## 2018-11-11 DIAGNOSIS — Z992 Dependence on renal dialysis: Secondary | ICD-10-CM | POA: Diagnosis not present

## 2018-11-13 ENCOUNTER — Ambulatory Visit: Payer: Medicare Other | Admitting: Adult Health

## 2018-11-14 DIAGNOSIS — D509 Iron deficiency anemia, unspecified: Secondary | ICD-10-CM | POA: Diagnosis not present

## 2018-11-14 DIAGNOSIS — N2581 Secondary hyperparathyroidism of renal origin: Secondary | ICD-10-CM | POA: Diagnosis not present

## 2018-11-14 DIAGNOSIS — D631 Anemia in chronic kidney disease: Secondary | ICD-10-CM | POA: Diagnosis not present

## 2018-11-14 DIAGNOSIS — Z992 Dependence on renal dialysis: Secondary | ICD-10-CM | POA: Diagnosis not present

## 2018-11-14 DIAGNOSIS — N186 End stage renal disease: Secondary | ICD-10-CM | POA: Diagnosis not present

## 2018-11-18 DIAGNOSIS — D631 Anemia in chronic kidney disease: Secondary | ICD-10-CM | POA: Diagnosis not present

## 2018-11-18 DIAGNOSIS — N186 End stage renal disease: Secondary | ICD-10-CM | POA: Diagnosis not present

## 2018-11-18 DIAGNOSIS — D509 Iron deficiency anemia, unspecified: Secondary | ICD-10-CM | POA: Diagnosis not present

## 2018-11-18 DIAGNOSIS — N2581 Secondary hyperparathyroidism of renal origin: Secondary | ICD-10-CM | POA: Diagnosis not present

## 2018-11-18 DIAGNOSIS — Z992 Dependence on renal dialysis: Secondary | ICD-10-CM | POA: Diagnosis not present

## 2018-11-19 DIAGNOSIS — N2581 Secondary hyperparathyroidism of renal origin: Secondary | ICD-10-CM | POA: Diagnosis not present

## 2018-11-19 DIAGNOSIS — Z992 Dependence on renal dialysis: Secondary | ICD-10-CM | POA: Diagnosis not present

## 2018-11-19 DIAGNOSIS — D509 Iron deficiency anemia, unspecified: Secondary | ICD-10-CM | POA: Diagnosis not present

## 2018-11-19 DIAGNOSIS — N186 End stage renal disease: Secondary | ICD-10-CM | POA: Diagnosis not present

## 2018-11-19 DIAGNOSIS — D631 Anemia in chronic kidney disease: Secondary | ICD-10-CM | POA: Diagnosis not present

## 2018-11-21 DIAGNOSIS — Z992 Dependence on renal dialysis: Secondary | ICD-10-CM | POA: Diagnosis not present

## 2018-11-21 DIAGNOSIS — D509 Iron deficiency anemia, unspecified: Secondary | ICD-10-CM | POA: Diagnosis not present

## 2018-11-21 DIAGNOSIS — D631 Anemia in chronic kidney disease: Secondary | ICD-10-CM | POA: Diagnosis not present

## 2018-11-21 DIAGNOSIS — N2581 Secondary hyperparathyroidism of renal origin: Secondary | ICD-10-CM | POA: Diagnosis not present

## 2018-11-21 DIAGNOSIS — N186 End stage renal disease: Secondary | ICD-10-CM | POA: Diagnosis not present

## 2018-11-24 DIAGNOSIS — N186 End stage renal disease: Secondary | ICD-10-CM | POA: Diagnosis not present

## 2018-11-24 DIAGNOSIS — I739 Peripheral vascular disease, unspecified: Secondary | ICD-10-CM | POA: Diagnosis not present

## 2018-11-24 DIAGNOSIS — I10 Essential (primary) hypertension: Secondary | ICD-10-CM | POA: Diagnosis not present

## 2018-11-24 DIAGNOSIS — E039 Hypothyroidism, unspecified: Secondary | ICD-10-CM | POA: Diagnosis not present

## 2018-11-24 DIAGNOSIS — K219 Gastro-esophageal reflux disease without esophagitis: Secondary | ICD-10-CM | POA: Diagnosis not present

## 2018-11-24 DIAGNOSIS — I639 Cerebral infarction, unspecified: Secondary | ICD-10-CM | POA: Diagnosis not present

## 2018-11-24 DIAGNOSIS — E785 Hyperlipidemia, unspecified: Secondary | ICD-10-CM | POA: Diagnosis not present

## 2018-11-24 DIAGNOSIS — R1310 Dysphagia, unspecified: Secondary | ICD-10-CM | POA: Diagnosis not present

## 2018-11-24 DIAGNOSIS — R630 Anorexia: Secondary | ICD-10-CM | POA: Diagnosis not present

## 2018-11-25 DIAGNOSIS — E785 Hyperlipidemia, unspecified: Secondary | ICD-10-CM | POA: Diagnosis not present

## 2018-11-25 DIAGNOSIS — I10 Essential (primary) hypertension: Secondary | ICD-10-CM | POA: Diagnosis not present

## 2018-11-25 DIAGNOSIS — I739 Peripheral vascular disease, unspecified: Secondary | ICD-10-CM | POA: Diagnosis not present

## 2018-11-25 DIAGNOSIS — K219 Gastro-esophageal reflux disease without esophagitis: Secondary | ICD-10-CM | POA: Diagnosis not present

## 2018-11-25 DIAGNOSIS — N186 End stage renal disease: Secondary | ICD-10-CM | POA: Diagnosis not present

## 2018-11-25 DIAGNOSIS — I639 Cerebral infarction, unspecified: Secondary | ICD-10-CM | POA: Diagnosis not present

## 2018-11-27 DIAGNOSIS — K219 Gastro-esophageal reflux disease without esophagitis: Secondary | ICD-10-CM | POA: Diagnosis not present

## 2018-11-27 DIAGNOSIS — I639 Cerebral infarction, unspecified: Secondary | ICD-10-CM | POA: Diagnosis not present

## 2018-11-27 DIAGNOSIS — E785 Hyperlipidemia, unspecified: Secondary | ICD-10-CM | POA: Diagnosis not present

## 2018-11-27 DIAGNOSIS — I739 Peripheral vascular disease, unspecified: Secondary | ICD-10-CM | POA: Diagnosis not present

## 2018-11-27 DIAGNOSIS — I10 Essential (primary) hypertension: Secondary | ICD-10-CM | POA: Diagnosis not present

## 2018-11-27 DIAGNOSIS — N186 End stage renal disease: Secondary | ICD-10-CM | POA: Diagnosis not present

## 2018-11-28 ENCOUNTER — Ambulatory Visit: Payer: Medicare Other | Admitting: Vascular Surgery

## 2018-11-28 ENCOUNTER — Encounter (HOSPITAL_COMMUNITY): Payer: Medicare Other

## 2018-11-28 DIAGNOSIS — K219 Gastro-esophageal reflux disease without esophagitis: Secondary | ICD-10-CM | POA: Diagnosis not present

## 2018-11-28 DIAGNOSIS — E785 Hyperlipidemia, unspecified: Secondary | ICD-10-CM | POA: Diagnosis not present

## 2018-11-28 DIAGNOSIS — N186 End stage renal disease: Secondary | ICD-10-CM | POA: Diagnosis not present

## 2018-11-28 DIAGNOSIS — I739 Peripheral vascular disease, unspecified: Secondary | ICD-10-CM | POA: Diagnosis not present

## 2018-11-28 DIAGNOSIS — I10 Essential (primary) hypertension: Secondary | ICD-10-CM | POA: Diagnosis not present

## 2018-11-28 DIAGNOSIS — I639 Cerebral infarction, unspecified: Secondary | ICD-10-CM | POA: Diagnosis not present

## 2018-11-29 DIAGNOSIS — E785 Hyperlipidemia, unspecified: Secondary | ICD-10-CM | POA: Diagnosis not present

## 2018-11-29 DIAGNOSIS — I739 Peripheral vascular disease, unspecified: Secondary | ICD-10-CM | POA: Diagnosis not present

## 2018-11-29 DIAGNOSIS — K219 Gastro-esophageal reflux disease without esophagitis: Secondary | ICD-10-CM | POA: Diagnosis not present

## 2018-11-29 DIAGNOSIS — N186 End stage renal disease: Secondary | ICD-10-CM | POA: Diagnosis not present

## 2018-11-29 DIAGNOSIS — I639 Cerebral infarction, unspecified: Secondary | ICD-10-CM | POA: Diagnosis not present

## 2018-11-29 DIAGNOSIS — I10 Essential (primary) hypertension: Secondary | ICD-10-CM | POA: Diagnosis not present

## 2018-11-30 DIAGNOSIS — E785 Hyperlipidemia, unspecified: Secondary | ICD-10-CM | POA: Diagnosis not present

## 2018-11-30 DIAGNOSIS — R627 Adult failure to thrive: Secondary | ICD-10-CM | POA: Diagnosis not present

## 2018-11-30 DIAGNOSIS — K219 Gastro-esophageal reflux disease without esophagitis: Secondary | ICD-10-CM | POA: Diagnosis not present

## 2018-11-30 DIAGNOSIS — I639 Cerebral infarction, unspecified: Secondary | ICD-10-CM | POA: Diagnosis not present

## 2018-11-30 DIAGNOSIS — N186 End stage renal disease: Secondary | ICD-10-CM | POA: Diagnosis not present

## 2018-11-30 DIAGNOSIS — I10 Essential (primary) hypertension: Secondary | ICD-10-CM | POA: Diagnosis not present

## 2018-11-30 DIAGNOSIS — I739 Peripheral vascular disease, unspecified: Secondary | ICD-10-CM | POA: Diagnosis not present

## 2018-11-30 DIAGNOSIS — Z992 Dependence on renal dialysis: Secondary | ICD-10-CM | POA: Diagnosis not present

## 2018-11-30 DIAGNOSIS — E43 Unspecified severe protein-calorie malnutrition: Secondary | ICD-10-CM | POA: Diagnosis not present

## 2018-12-01 DIAGNOSIS — I639 Cerebral infarction, unspecified: Secondary | ICD-10-CM | POA: Diagnosis not present

## 2018-12-01 DIAGNOSIS — N186 End stage renal disease: Secondary | ICD-10-CM | POA: Diagnosis not present

## 2018-12-01 DIAGNOSIS — R630 Anorexia: Secondary | ICD-10-CM | POA: Diagnosis not present

## 2018-12-01 DIAGNOSIS — I10 Essential (primary) hypertension: Secondary | ICD-10-CM | POA: Diagnosis not present

## 2018-12-01 DIAGNOSIS — R1310 Dysphagia, unspecified: Secondary | ICD-10-CM | POA: Diagnosis not present

## 2018-12-01 DIAGNOSIS — E039 Hypothyroidism, unspecified: Secondary | ICD-10-CM | POA: Diagnosis not present

## 2018-12-01 DIAGNOSIS — K219 Gastro-esophageal reflux disease without esophagitis: Secondary | ICD-10-CM | POA: Diagnosis not present

## 2018-12-01 DIAGNOSIS — I739 Peripheral vascular disease, unspecified: Secondary | ICD-10-CM | POA: Diagnosis not present

## 2018-12-01 DIAGNOSIS — E785 Hyperlipidemia, unspecified: Secondary | ICD-10-CM | POA: Diagnosis not present

## 2018-12-12 ENCOUNTER — Ambulatory Visit: Payer: Medicare Other | Admitting: Vascular Surgery

## 2018-12-12 ENCOUNTER — Encounter (HOSPITAL_COMMUNITY): Payer: Medicare Other

## 2019-01-01 DEATH — deceased

## 2019-01-04 ENCOUNTER — Ambulatory Visit: Payer: Medicare Other | Admitting: Adult Health

## 2019-01-11 ENCOUNTER — Ambulatory Visit: Payer: Medicare Other | Admitting: Adult Health

## 2023-08-11 ENCOUNTER — Encounter: Payer: Self-pay | Admitting: *Deleted
